# Patient Record
Sex: Male | Born: 1940
Health system: Southern US, Community
[De-identification: ages and names within clinical notes are randomized; demographics above are authoritative.]

## PROBLEM LIST (undated history)

## (undated) DIAGNOSIS — I25810 Atherosclerosis of coronary artery bypass graft(s) without angina pectoris: Secondary | ICD-10-CM

## (undated) DIAGNOSIS — I2510 Atherosclerotic heart disease of native coronary artery without angina pectoris: Secondary | ICD-10-CM

## (undated) DIAGNOSIS — G90511 Complex regional pain syndrome I of right upper limb: Secondary | ICD-10-CM

## (undated) DIAGNOSIS — I10 Essential (primary) hypertension: Secondary | ICD-10-CM

## (undated) DIAGNOSIS — I200 Unstable angina: Secondary | ICD-10-CM

## (undated) DIAGNOSIS — F419 Anxiety disorder, unspecified: Secondary | ICD-10-CM

## (undated) DIAGNOSIS — E669 Obesity, unspecified: Secondary | ICD-10-CM

## (undated) DIAGNOSIS — Z951 Presence of aortocoronary bypass graft: Secondary | ICD-10-CM

## (undated) DIAGNOSIS — R7303 Prediabetes: Secondary | ICD-10-CM

## (undated) DIAGNOSIS — K219 Gastro-esophageal reflux disease without esophagitis: Secondary | ICD-10-CM

## (undated) DIAGNOSIS — K449 Diaphragmatic hernia without obstruction or gangrene: Secondary | ICD-10-CM

## (undated) DIAGNOSIS — G250 Essential tremor: Secondary | ICD-10-CM

## (undated) DIAGNOSIS — H409 Unspecified glaucoma: Secondary | ICD-10-CM

## (undated) DIAGNOSIS — M4802 Spinal stenosis, cervical region: Secondary | ICD-10-CM

## (undated) DIAGNOSIS — G629 Polyneuropathy, unspecified: Secondary | ICD-10-CM

## (undated) DIAGNOSIS — E119 Type 2 diabetes mellitus without complications: Secondary | ICD-10-CM

## (undated) DIAGNOSIS — E785 Hyperlipidemia, unspecified: Secondary | ICD-10-CM

## (undated) DIAGNOSIS — R413 Other amnesia: Secondary | ICD-10-CM

## (undated) DIAGNOSIS — R269 Unspecified abnormalities of gait and mobility: Secondary | ICD-10-CM

## (undated) DIAGNOSIS — N400 Enlarged prostate without lower urinary tract symptoms: Secondary | ICD-10-CM

## (undated) HISTORY — DX: Unspecified abnormalities of gait and mobility: R26.9

## (undated) HISTORY — DX: Anxiety disorder, unspecified: F41.9

## (undated) HISTORY — DX: Gastro-esophageal reflux disease without esophagitis: K21.9

## (undated) HISTORY — DX: Essential (primary) hypertension: I10

## (undated) HISTORY — PX: EYE SURGERY: SHX253

## (undated) HISTORY — DX: Obesity, unspecified: E66.9

## (undated) HISTORY — DX: Diaphragmatic hernia without obstruction or gangrene: K44.9

## (undated) HISTORY — DX: Spinal stenosis, cervical region: M48.02

## (undated) HISTORY — DX: Unspecified glaucoma: H40.9

## (undated) HISTORY — DX: Type 2 diabetes mellitus without complications: E11.9

## (undated) HISTORY — DX: Benign prostatic hyperplasia without lower urinary tract symptoms: N40.0

## (undated) HISTORY — DX: Atherosclerotic heart disease of native coronary artery without angina pectoris: I25.10

## (undated) HISTORY — DX: Presence of aortocoronary bypass graft: Z95.1

## (undated) HISTORY — DX: Complex regional pain syndrome i of right upper limb: G90.511

## (undated) HISTORY — PX: LEFT HEART CATH AND CORONARY ANGIOGRAPHY: CATH118249

## (undated) HISTORY — DX: Polyneuropathy, unspecified: G62.9

## (undated) HISTORY — DX: Hyperlipidemia, unspecified: E78.5

## (undated) HISTORY — PX: CATARACT EXTRACTION, BILATERAL: SHX1313

## (undated) HISTORY — DX: Atherosclerosis of coronary artery bypass graft(s) without angina pectoris: I25.810

## (undated) HISTORY — PX: OTHER SURGICAL HISTORY: SHX169

## (undated) HISTORY — DX: Unstable angina: I20.0

## (undated) HISTORY — PX: KNEE SURGERY: SHX244

## (undated) HISTORY — DX: Other amnesia: R41.3

## (undated) HISTORY — PX: CARDIAC CATHETERIZATION: SHX172

## (undated) HISTORY — DX: Essential tremor: G25.0

---

## 1995-04-18 DIAGNOSIS — I2510 Atherosclerotic heart disease of native coronary artery without angina pectoris: Secondary | ICD-10-CM

## 1995-04-18 HISTORY — DX: Atherosclerotic heart disease of native coronary artery without angina pectoris: I25.10

## 1998-02-08 ENCOUNTER — Encounter: Admission: RE | Admit: 1998-02-08 | Discharge: 1998-02-08 | Payer: Self-pay | Admitting: Sports Medicine

## 1998-02-22 ENCOUNTER — Encounter: Admission: RE | Admit: 1998-02-22 | Discharge: 1998-02-22 | Payer: Self-pay | Admitting: Family Medicine

## 1998-04-14 ENCOUNTER — Encounter: Admission: RE | Admit: 1998-04-14 | Discharge: 1998-04-14 | Payer: Self-pay | Admitting: Family Medicine

## 1998-04-15 ENCOUNTER — Encounter: Admission: RE | Admit: 1998-04-15 | Discharge: 1998-04-15 | Payer: Self-pay | Admitting: Family Medicine

## 1998-04-21 ENCOUNTER — Encounter: Admission: RE | Admit: 1998-04-21 | Discharge: 1998-04-21 | Payer: Self-pay | Admitting: Family Medicine

## 1998-04-26 ENCOUNTER — Encounter: Admission: RE | Admit: 1998-04-26 | Discharge: 1998-04-26 | Payer: Self-pay | Admitting: Family Medicine

## 2000-04-30 ENCOUNTER — Encounter: Admission: RE | Admit: 2000-04-30 | Discharge: 2000-04-30 | Payer: Self-pay | Admitting: Family Medicine

## 2000-05-10 ENCOUNTER — Encounter: Admission: RE | Admit: 2000-05-10 | Discharge: 2000-05-10 | Payer: Self-pay | Admitting: Family Medicine

## 2000-06-19 ENCOUNTER — Encounter: Admission: RE | Admit: 2000-06-19 | Discharge: 2000-06-19 | Payer: Self-pay | Admitting: Family Medicine

## 2000-06-19 ENCOUNTER — Ambulatory Visit (HOSPITAL_COMMUNITY): Admission: RE | Admit: 2000-06-19 | Discharge: 2000-06-19 | Payer: Self-pay | Admitting: Family Medicine

## 2000-06-19 ENCOUNTER — Observation Stay (HOSPITAL_COMMUNITY): Admission: EM | Admit: 2000-06-19 | Discharge: 2000-06-20 | Payer: Self-pay

## 2000-06-20 ENCOUNTER — Encounter: Payer: Self-pay | Admitting: Sports Medicine

## 2000-07-05 ENCOUNTER — Encounter: Admission: RE | Admit: 2000-07-05 | Discharge: 2000-07-05 | Payer: Self-pay | Admitting: Family Medicine

## 2000-07-20 ENCOUNTER — Encounter: Admission: RE | Admit: 2000-07-20 | Discharge: 2000-07-20 | Payer: Self-pay | Admitting: Family Medicine

## 2000-08-01 ENCOUNTER — Encounter: Admission: RE | Admit: 2000-08-01 | Discharge: 2000-08-01 | Payer: Self-pay | Admitting: Family Medicine

## 2000-08-09 ENCOUNTER — Ambulatory Visit (HOSPITAL_COMMUNITY): Admission: RE | Admit: 2000-08-09 | Discharge: 2000-08-09 | Payer: Self-pay | Admitting: *Deleted

## 2000-08-15 ENCOUNTER — Encounter: Admission: RE | Admit: 2000-08-15 | Discharge: 2000-08-15 | Payer: Self-pay | Admitting: Family Medicine

## 2000-08-21 ENCOUNTER — Encounter (HOSPITAL_COMMUNITY): Admission: RE | Admit: 2000-08-21 | Discharge: 2000-11-19 | Payer: Self-pay | Admitting: Cardiology

## 2000-08-29 ENCOUNTER — Encounter: Admission: RE | Admit: 2000-08-29 | Discharge: 2000-08-29 | Payer: Self-pay | Admitting: Family Medicine

## 2000-09-12 ENCOUNTER — Encounter: Admission: RE | Admit: 2000-09-12 | Discharge: 2000-09-12 | Payer: Self-pay | Admitting: Family Medicine

## 2000-09-18 ENCOUNTER — Encounter: Admission: RE | Admit: 2000-09-18 | Discharge: 2000-09-18 | Payer: Self-pay | Admitting: Family Medicine

## 2000-09-21 ENCOUNTER — Ambulatory Visit (HOSPITAL_COMMUNITY): Admission: RE | Admit: 2000-09-21 | Discharge: 2000-09-21 | Payer: Self-pay | Admitting: *Deleted

## 2000-09-25 ENCOUNTER — Encounter: Admission: RE | Admit: 2000-09-25 | Discharge: 2000-09-25 | Payer: Self-pay | Admitting: Family Medicine

## 2000-10-25 ENCOUNTER — Encounter: Admission: RE | Admit: 2000-10-25 | Discharge: 2000-10-25 | Payer: Self-pay | Admitting: Family Medicine

## 2000-11-20 ENCOUNTER — Encounter (HOSPITAL_COMMUNITY): Admission: RE | Admit: 2000-11-20 | Discharge: 2001-02-18 | Payer: Self-pay | Admitting: Cardiology

## 2001-01-09 ENCOUNTER — Encounter: Payer: Self-pay | Admitting: Cardiology

## 2001-01-09 ENCOUNTER — Ambulatory Visit (HOSPITAL_COMMUNITY): Admission: RE | Admit: 2001-01-09 | Discharge: 2001-01-09 | Payer: Self-pay | Admitting: Cardiology

## 2001-02-21 ENCOUNTER — Encounter: Admission: RE | Admit: 2001-02-21 | Discharge: 2001-02-21 | Payer: Self-pay | Admitting: Family Medicine

## 2001-03-25 ENCOUNTER — Encounter: Admission: RE | Admit: 2001-03-25 | Discharge: 2001-03-25 | Payer: Self-pay | Admitting: Sports Medicine

## 2001-04-24 ENCOUNTER — Encounter: Admission: RE | Admit: 2001-04-24 | Discharge: 2001-04-24 | Payer: Self-pay | Admitting: Family Medicine

## 2001-09-03 ENCOUNTER — Ambulatory Visit (HOSPITAL_COMMUNITY): Admission: RE | Admit: 2001-09-03 | Discharge: 2001-09-03 | Payer: Self-pay | Admitting: Cardiology

## 2001-09-03 ENCOUNTER — Encounter: Payer: Self-pay | Admitting: Cardiology

## 2002-01-03 ENCOUNTER — Encounter: Admission: RE | Admit: 2002-01-03 | Discharge: 2002-01-03 | Payer: Self-pay | Admitting: Family Medicine

## 2002-01-15 ENCOUNTER — Encounter: Admission: RE | Admit: 2002-01-15 | Discharge: 2002-01-15 | Payer: Self-pay | Admitting: Family Medicine

## 2002-02-02 ENCOUNTER — Ambulatory Visit (HOSPITAL_BASED_OUTPATIENT_CLINIC_OR_DEPARTMENT_OTHER): Admission: RE | Admit: 2002-02-02 | Discharge: 2002-02-02 | Payer: Self-pay | Admitting: Family Medicine

## 2002-02-11 ENCOUNTER — Ambulatory Visit (HOSPITAL_COMMUNITY): Admission: RE | Admit: 2002-02-11 | Discharge: 2002-02-11 | Payer: Self-pay | Admitting: Family Medicine

## 2002-02-11 ENCOUNTER — Encounter: Admission: RE | Admit: 2002-02-11 | Discharge: 2002-02-11 | Payer: Self-pay | Admitting: Family Medicine

## 2002-02-27 ENCOUNTER — Encounter: Admission: RE | Admit: 2002-02-27 | Discharge: 2002-02-27 | Payer: Self-pay | Admitting: Family Medicine

## 2002-04-17 DIAGNOSIS — Z951 Presence of aortocoronary bypass graft: Secondary | ICD-10-CM

## 2002-04-17 HISTORY — DX: Presence of aortocoronary bypass graft: Z95.1

## 2002-05-19 ENCOUNTER — Encounter: Admission: RE | Admit: 2002-05-19 | Discharge: 2002-05-19 | Payer: Self-pay | Admitting: Family Medicine

## 2002-05-19 ENCOUNTER — Ambulatory Visit (HOSPITAL_COMMUNITY): Admission: RE | Admit: 2002-05-19 | Discharge: 2002-05-19 | Payer: Self-pay | Admitting: Family Medicine

## 2002-05-26 ENCOUNTER — Encounter: Admission: RE | Admit: 2002-05-26 | Discharge: 2002-05-26 | Payer: Self-pay | Admitting: Family Medicine

## 2002-06-04 ENCOUNTER — Encounter: Admission: RE | Admit: 2002-06-04 | Discharge: 2002-06-04 | Payer: Self-pay | Admitting: Family Medicine

## 2002-06-10 ENCOUNTER — Encounter: Admission: RE | Admit: 2002-06-10 | Discharge: 2002-06-10 | Payer: Self-pay | Admitting: Cardiology

## 2002-06-10 ENCOUNTER — Encounter: Payer: Self-pay | Admitting: Cardiology

## 2002-06-16 ENCOUNTER — Ambulatory Visit (HOSPITAL_COMMUNITY): Admission: RE | Admit: 2002-06-16 | Discharge: 2002-06-16 | Payer: Self-pay | Admitting: Cardiology

## 2002-06-16 HISTORY — PX: CORONARY ARTERY BYPASS GRAFT: SHX141

## 2002-06-26 ENCOUNTER — Encounter: Payer: Self-pay | Admitting: Cardiothoracic Surgery

## 2002-06-30 ENCOUNTER — Encounter: Payer: Self-pay | Admitting: Cardiothoracic Surgery

## 2002-06-30 ENCOUNTER — Inpatient Hospital Stay (HOSPITAL_COMMUNITY): Admission: RE | Admit: 2002-06-30 | Discharge: 2002-07-05 | Payer: Self-pay | Admitting: Cardiothoracic Surgery

## 2002-07-01 ENCOUNTER — Encounter: Payer: Self-pay | Admitting: Cardiothoracic Surgery

## 2002-07-02 ENCOUNTER — Encounter: Payer: Self-pay | Admitting: Cardiothoracic Surgery

## 2002-07-03 ENCOUNTER — Encounter: Payer: Self-pay | Admitting: Cardiothoracic Surgery

## 2002-08-01 ENCOUNTER — Encounter: Admission: RE | Admit: 2002-08-01 | Discharge: 2002-08-01 | Payer: Self-pay | Admitting: Cardiothoracic Surgery

## 2002-08-01 ENCOUNTER — Encounter: Payer: Self-pay | Admitting: Cardiothoracic Surgery

## 2002-08-11 ENCOUNTER — Encounter (HOSPITAL_COMMUNITY): Admission: RE | Admit: 2002-08-11 | Discharge: 2002-10-16 | Payer: Self-pay | Admitting: Cardiology

## 2002-09-04 ENCOUNTER — Encounter: Admission: RE | Admit: 2002-09-04 | Discharge: 2002-09-04 | Payer: Self-pay | Admitting: Family Medicine

## 2002-09-17 ENCOUNTER — Encounter: Admission: RE | Admit: 2002-09-17 | Discharge: 2002-09-17 | Payer: Self-pay | Admitting: Family Medicine

## 2002-09-23 ENCOUNTER — Encounter: Admission: RE | Admit: 2002-09-23 | Discharge: 2002-09-23 | Payer: Self-pay | Admitting: Sports Medicine

## 2002-10-08 ENCOUNTER — Encounter: Admission: RE | Admit: 2002-10-08 | Discharge: 2002-10-08 | Payer: Self-pay | Admitting: Family Medicine

## 2002-10-31 ENCOUNTER — Encounter: Admission: RE | Admit: 2002-10-31 | Discharge: 2002-10-31 | Payer: Self-pay | Admitting: Family Medicine

## 2002-12-29 ENCOUNTER — Encounter: Admission: RE | Admit: 2002-12-29 | Discharge: 2002-12-29 | Payer: Self-pay | Admitting: Family Medicine

## 2003-01-06 ENCOUNTER — Encounter: Payer: Self-pay | Admitting: Family Medicine

## 2003-01-06 ENCOUNTER — Ambulatory Visit (HOSPITAL_COMMUNITY): Admission: RE | Admit: 2003-01-06 | Discharge: 2003-01-06 | Payer: Self-pay | Admitting: Family Medicine

## 2003-01-28 ENCOUNTER — Encounter: Admission: RE | Admit: 2003-01-28 | Discharge: 2003-01-28 | Payer: Self-pay | Admitting: Family Medicine

## 2003-02-03 ENCOUNTER — Encounter: Admission: RE | Admit: 2003-02-03 | Discharge: 2003-02-17 | Payer: Self-pay | Admitting: Sports Medicine

## 2003-03-31 ENCOUNTER — Ambulatory Visit (HOSPITAL_COMMUNITY): Admission: RE | Admit: 2003-03-31 | Discharge: 2003-04-01 | Payer: Self-pay | Admitting: Orthopedic Surgery

## 2003-04-02 ENCOUNTER — Encounter: Admission: RE | Admit: 2003-04-02 | Discharge: 2003-04-17 | Payer: Self-pay | Admitting: Orthopedic Surgery

## 2003-04-18 ENCOUNTER — Encounter: Admission: RE | Admit: 2003-04-18 | Discharge: 2003-06-02 | Payer: Self-pay | Admitting: Orthopedic Surgery

## 2003-06-16 HISTORY — PX: OTHER SURGICAL HISTORY: SHX169

## 2003-06-26 ENCOUNTER — Encounter: Admission: RE | Admit: 2003-06-26 | Discharge: 2003-06-26 | Payer: Self-pay | Admitting: Family Medicine

## 2003-06-29 ENCOUNTER — Encounter: Admission: RE | Admit: 2003-06-29 | Discharge: 2003-06-29 | Payer: Self-pay | Admitting: Family Medicine

## 2003-07-28 ENCOUNTER — Encounter: Admission: RE | Admit: 2003-07-28 | Discharge: 2003-07-28 | Payer: Self-pay | Admitting: Sports Medicine

## 2003-07-28 ENCOUNTER — Encounter (INDEPENDENT_AMBULATORY_CARE_PROVIDER_SITE_OTHER): Payer: Self-pay | Admitting: Family Medicine

## 2003-09-22 ENCOUNTER — Ambulatory Visit (HOSPITAL_COMMUNITY): Admission: RE | Admit: 2003-09-22 | Discharge: 2003-09-23 | Payer: Self-pay | Admitting: Orthopedic Surgery

## 2003-09-29 ENCOUNTER — Encounter: Admission: RE | Admit: 2003-09-29 | Discharge: 2003-11-03 | Payer: Self-pay | Admitting: Orthopedic Surgery

## 2004-04-21 ENCOUNTER — Ambulatory Visit: Payer: Self-pay | Admitting: Family Medicine

## 2005-04-27 ENCOUNTER — Ambulatory Visit: Payer: Self-pay | Admitting: Family Medicine

## 2005-05-12 ENCOUNTER — Ambulatory Visit: Payer: Self-pay | Admitting: Sports Medicine

## 2005-09-08 ENCOUNTER — Ambulatory Visit: Payer: Self-pay | Admitting: Family Medicine

## 2005-10-11 ENCOUNTER — Ambulatory Visit: Payer: Self-pay | Admitting: Family Medicine

## 2005-11-08 ENCOUNTER — Ambulatory Visit: Payer: Self-pay | Admitting: Sports Medicine

## 2005-11-23 ENCOUNTER — Ambulatory Visit: Payer: Self-pay | Admitting: Family Medicine

## 2006-01-23 ENCOUNTER — Ambulatory Visit: Payer: Self-pay | Admitting: Family Medicine

## 2006-02-23 ENCOUNTER — Ambulatory Visit: Payer: Self-pay | Admitting: Family Medicine

## 2006-03-26 ENCOUNTER — Ambulatory Visit: Payer: Self-pay | Admitting: Family Medicine

## 2006-05-25 ENCOUNTER — Ambulatory Visit: Payer: Self-pay | Admitting: Family Medicine

## 2006-07-06 ENCOUNTER — Encounter (INDEPENDENT_AMBULATORY_CARE_PROVIDER_SITE_OTHER): Payer: Self-pay | Admitting: Family Medicine

## 2006-07-06 ENCOUNTER — Ambulatory Visit: Payer: Self-pay | Admitting: Family Medicine

## 2006-07-06 DIAGNOSIS — I10 Essential (primary) hypertension: Secondary | ICD-10-CM | POA: Insufficient documentation

## 2006-07-06 LAB — CONVERTED CEMR LAB
ALT: 13 units/L (ref 0–53)
AST: 17 units/L (ref 0–37)
Albumin: 4.4 g/dL (ref 3.5–5.2)
Alkaline Phosphatase: 89 units/L (ref 39–117)
BUN: 11 mg/dL (ref 6–23)
CO2: 27 meq/L (ref 19–32)
Calcium: 9.9 mg/dL (ref 8.4–10.5)
Chloride: 104 meq/L (ref 96–112)
Creatinine, Ser: 0.98 mg/dL (ref 0.40–1.50)
Glucose, Bld: 115 mg/dL — ABNORMAL HIGH (ref 70–99)
Potassium: 4.6 meq/L (ref 3.5–5.3)
Sodium: 141 meq/L (ref 135–145)
Total Bilirubin: 0.9 mg/dL (ref 0.3–1.2)
Total Protein: 7.1 g/dL (ref 6.0–8.3)

## 2006-08-30 ENCOUNTER — Ambulatory Visit: Payer: Self-pay | Admitting: Sports Medicine

## 2006-08-30 ENCOUNTER — Encounter (INDEPENDENT_AMBULATORY_CARE_PROVIDER_SITE_OTHER): Payer: Self-pay | Admitting: Family Medicine

## 2006-08-30 DIAGNOSIS — I25810 Atherosclerosis of coronary artery bypass graft(s) without angina pectoris: Secondary | ICD-10-CM | POA: Insufficient documentation

## 2006-08-30 DIAGNOSIS — G250 Essential tremor: Secondary | ICD-10-CM | POA: Insufficient documentation

## 2006-08-30 DIAGNOSIS — E782 Mixed hyperlipidemia: Secondary | ICD-10-CM | POA: Insufficient documentation

## 2006-08-30 LAB — CONVERTED CEMR LAB
Cholesterol, target level: 200 mg/dL
Direct LDL: 84 mg/dL
HDL goal, serum: 40 mg/dL
Hgb A1c MFr Bld: 5.8 %
LDL Goal: 100 mg/dL

## 2006-12-11 ENCOUNTER — Ambulatory Visit: Payer: Self-pay | Admitting: Family Medicine

## 2006-12-11 LAB — CONVERTED CEMR LAB: Hgb A1c MFr Bld: 5.6 %

## 2006-12-13 ENCOUNTER — Encounter (INDEPENDENT_AMBULATORY_CARE_PROVIDER_SITE_OTHER): Payer: Self-pay | Admitting: Family Medicine

## 2007-01-03 ENCOUNTER — Ambulatory Visit: Payer: Self-pay | Admitting: Family Medicine

## 2007-01-21 ENCOUNTER — Ambulatory Visit: Payer: Self-pay | Admitting: Sports Medicine

## 2007-02-13 ENCOUNTER — Ambulatory Visit: Payer: Self-pay | Admitting: Family Medicine

## 2007-03-06 ENCOUNTER — Ambulatory Visit: Payer: Self-pay | Admitting: Family Medicine

## 2007-06-26 ENCOUNTER — Encounter (INDEPENDENT_AMBULATORY_CARE_PROVIDER_SITE_OTHER): Payer: Self-pay | Admitting: Family Medicine

## 2007-06-26 ENCOUNTER — Ambulatory Visit: Payer: Self-pay | Admitting: Family Medicine

## 2007-06-26 LAB — CONVERTED CEMR LAB
BUN: 10 mg/dL (ref 6–23)
CO2: 25 meq/L (ref 19–32)
Calcium: 9.2 mg/dL (ref 8.4–10.5)
Chloride: 108 meq/L (ref 96–112)
Cholesterol: 118 mg/dL (ref 0–200)
Creatinine, Ser: 0.92 mg/dL (ref 0.40–1.50)
Glucose, Bld: 121 mg/dL — ABNORMAL HIGH (ref 70–99)
HCT: 42.4 % (ref 39.0–52.0)
HDL: 33 mg/dL — ABNORMAL LOW (ref >39)
Hemoglobin: 13.7 g/dL (ref 13.0–17.0)
Hgb A1c MFr Bld: 5.7 %
LDL Cholesterol: 64 mg/dL (ref 0–99)
MCHC: 32.3 g/dL (ref 30.0–36.0)
MCV: 96.8 fL (ref 78.0–100.0)
Platelets: 156 10*3/uL (ref 150–400)
Potassium: 5 meq/L (ref 3.5–5.3)
RBC: 4.38 M/uL (ref 4.22–5.81)
RDW: 14.2 % (ref 11.5–15.5)
Sodium: 142 meq/L (ref 135–145)
Total CHOL/HDL Ratio: 3.6
Triglycerides: 105 mg/dL (ref <150)
VLDL: 21 mg/dL (ref 0–40)
WBC: 5 10*3/uL (ref 4.0–10.5)

## 2007-06-27 ENCOUNTER — Encounter (INDEPENDENT_AMBULATORY_CARE_PROVIDER_SITE_OTHER): Payer: Self-pay | Admitting: Family Medicine

## 2007-09-06 ENCOUNTER — Ambulatory Visit: Payer: Self-pay | Admitting: Family Medicine

## 2007-10-28 ENCOUNTER — Ambulatory Visit: Payer: Self-pay | Admitting: Family Medicine

## 2007-10-28 DIAGNOSIS — K219 Gastro-esophageal reflux disease without esophagitis: Secondary | ICD-10-CM | POA: Insufficient documentation

## 2007-10-28 LAB — CONVERTED CEMR LAB: H Pylori IgG: NEGATIVE

## 2007-10-30 ENCOUNTER — Encounter: Payer: Self-pay | Admitting: Family Medicine

## 2007-10-30 ENCOUNTER — Encounter: Admission: RE | Admit: 2007-10-30 | Discharge: 2007-10-30 | Payer: Self-pay | Admitting: Family Medicine

## 2007-11-22 ENCOUNTER — Ambulatory Visit: Payer: Self-pay | Admitting: Family Medicine

## 2008-02-07 ENCOUNTER — Ambulatory Visit: Payer: Self-pay | Admitting: Family Medicine

## 2008-03-10 ENCOUNTER — Ambulatory Visit: Payer: Self-pay | Admitting: Family Medicine

## 2008-03-10 LAB — CONVERTED CEMR LAB: Hgb A1c MFr Bld: 5.8 %

## 2008-04-24 ENCOUNTER — Telehealth: Payer: Self-pay | Admitting: Family Medicine

## 2008-04-29 ENCOUNTER — Ambulatory Visit: Payer: Self-pay | Admitting: Family Medicine

## 2008-04-29 DIAGNOSIS — I209 Angina pectoris, unspecified: Secondary | ICD-10-CM | POA: Insufficient documentation

## 2008-06-10 ENCOUNTER — Ambulatory Visit: Payer: Self-pay | Admitting: Family Medicine

## 2008-06-30 ENCOUNTER — Ambulatory Visit: Payer: Self-pay | Admitting: Family Medicine

## 2008-06-30 ENCOUNTER — Encounter: Payer: Self-pay | Admitting: Family Medicine

## 2008-06-30 LAB — CONVERTED CEMR LAB
ALT: 22 units/L (ref 0–53)
AST: 17 units/L (ref 0–37)
Albumin: 4.6 g/dL (ref 3.5–5.2)
Alkaline Phosphatase: 105 units/L (ref 39–117)
BUN: 9 mg/dL (ref 6–23)
CO2: 26 meq/L (ref 19–32)
Calcium: 9.7 mg/dL (ref 8.4–10.5)
Chloride: 103 meq/L (ref 96–112)
Cholesterol: 142 mg/dL (ref 0–200)
Creatinine, Ser: 1.02 mg/dL (ref 0.40–1.50)
Glucose, Bld: 95 mg/dL (ref 70–99)
HCT: 43 % (ref 39.0–52.0)
HDL: 37 mg/dL — ABNORMAL LOW (ref >39)
Hemoglobin: 14.8 g/dL (ref 13.0–17.0)
LDL Cholesterol: 81 mg/dL (ref 0–99)
MCHC: 34.4 g/dL (ref 30.0–36.0)
MCV: 93.9 fL (ref 78.0–100.0)
PSA: 1 ng/mL (ref 0.10–4.00)
Platelets: 154 10*3/uL (ref 150–400)
Potassium: 5 meq/L (ref 3.5–5.3)
RBC: 4.58 M/uL (ref 4.22–5.81)
RDW: 14.8 % (ref 11.5–15.5)
Sodium: 141 meq/L (ref 135–145)
Total Bilirubin: 0.6 mg/dL (ref 0.3–1.2)
Total CHOL/HDL Ratio: 3.8
Total Protein: 7.8 g/dL (ref 6.0–8.3)
Triglycerides: 118 mg/dL (ref <150)
VLDL: 24 mg/dL (ref 0–40)
WBC: 5.3 10*3/uL (ref 4.0–10.5)

## 2008-07-01 ENCOUNTER — Encounter: Payer: Self-pay | Admitting: Family Medicine

## 2008-11-06 ENCOUNTER — Ambulatory Visit: Payer: Self-pay | Admitting: Family Medicine

## 2009-01-29 ENCOUNTER — Ambulatory Visit: Payer: Self-pay | Admitting: Family Medicine

## 2009-03-03 ENCOUNTER — Encounter: Payer: Self-pay | Admitting: Family Medicine

## 2009-03-23 ENCOUNTER — Encounter: Payer: Self-pay | Admitting: Family Medicine

## 2009-03-23 HISTORY — PX: NM MYOCAR PERF WALL MOTION: HXRAD629

## 2009-07-14 ENCOUNTER — Ambulatory Visit: Payer: Self-pay | Admitting: Family Medicine

## 2009-07-14 ENCOUNTER — Encounter: Payer: Self-pay | Admitting: Family Medicine

## 2009-07-14 DIAGNOSIS — E663 Overweight: Secondary | ICD-10-CM | POA: Insufficient documentation

## 2009-07-14 LAB — CONVERTED CEMR LAB
ALT: 18 units/L (ref 0–53)
AST: 18 units/L (ref 0–37)
Albumin: 4.5 g/dL (ref 3.5–5.2)
Alkaline Phosphatase: 85 units/L (ref 39–117)
BUN: 12 mg/dL (ref 6–23)
CO2: 27 meq/L (ref 19–32)
Calcium: 9.5 mg/dL (ref 8.4–10.5)
Chloride: 106 meq/L (ref 96–112)
Cholesterol: 129 mg/dL (ref 0–200)
Creatinine, Ser: 1.14 mg/dL (ref 0.40–1.50)
Glucose, Bld: 104 mg/dL — ABNORMAL HIGH (ref 70–99)
HCT: 41.8 % (ref 39.0–52.0)
HDL: 29 mg/dL — ABNORMAL LOW (ref >39)
Hemoglobin: 14.2 g/dL (ref 13.0–17.0)
Hgb A1c MFr Bld: 5.5 %
LDL Cholesterol: 79 mg/dL (ref 0–99)
MCHC: 34 g/dL (ref 30.0–36.0)
MCV: 97.9 fL (ref 78.0–100.0)
PSA: 1.06 ng/mL (ref 0.10–4.00)
Platelets: 151 10*3/uL (ref 150–400)
Potassium: 4.7 meq/L (ref 3.5–5.3)
RBC: 4.27 M/uL (ref 4.22–5.81)
RDW: 13.9 % (ref 11.5–15.5)
Sodium: 142 meq/L (ref 135–145)
Total Bilirubin: 0.6 mg/dL (ref 0.3–1.2)
Total CHOL/HDL Ratio: 4.4
Total Protein: 7.2 g/dL (ref 6.0–8.3)
Triglycerides: 104 mg/dL (ref <150)
VLDL: 21 mg/dL (ref 0–40)
WBC: 5 10*3/uL (ref 4.0–10.5)

## 2009-07-21 ENCOUNTER — Encounter: Payer: Self-pay | Admitting: Family Medicine

## 2009-07-21 LAB — CONVERTED CEMR LAB
OCCULT 1: NEGATIVE
OCCULT 2: NEGATIVE
OCCULT 3: NEGATIVE

## 2009-09-27 ENCOUNTER — Ambulatory Visit: Admission: RE | Admit: 2009-09-27 | Discharge: 2009-09-27 | Payer: Self-pay | Admitting: Family Medicine

## 2009-09-27 ENCOUNTER — Ambulatory Visit: Payer: Self-pay | Admitting: Vascular Surgery

## 2009-09-27 ENCOUNTER — Encounter: Payer: Self-pay | Admitting: Family Medicine

## 2009-09-27 ENCOUNTER — Ambulatory Visit: Payer: Self-pay | Admitting: Family Medicine

## 2009-09-27 ENCOUNTER — Telehealth: Payer: Self-pay | Admitting: Family Medicine

## 2010-01-18 ENCOUNTER — Ambulatory Visit: Payer: Self-pay | Admitting: Family Medicine

## 2010-01-28 ENCOUNTER — Encounter: Payer: Self-pay | Admitting: Family Medicine

## 2010-02-24 ENCOUNTER — Encounter: Payer: Self-pay | Admitting: Family Medicine

## 2010-05-07 ENCOUNTER — Encounter: Payer: Self-pay | Admitting: Orthopedic Surgery

## 2010-05-19 NOTE — Assessment & Plan Note (Signed)
Summary: F/U & FLU SHOT/[facility]   Vital Signs:  Patient profile:   70 year old male Height:      65.5 inches Weight:      198 pounds BMI:     32.57 Temp:     97.7 degrees F oral Pulse rate:   53 / minute BP sitting:   144 / 78  (left arm) Cuff size:   large  Vitals Entered By: Jimmy Footman, CMA (January 18, 2010 9:02 AM) CC: f/u & flu shot Is Patient Diabetic? No Pain Assessment Patient in pain? no        Primary Care Provider:  CAT TA MD  CC:  f/u & flu shot.  History of Present Illness: 69y/o M here for f/u of   HYPERTENSION/CAD Disease Monitoring: usually checks, but has not checked lately.  Blood pressure range: ave 130s/80s Medications: propanolol 80mg  two times a day  Compliance:yes  Lightheadedness: no  Edema:no  Chest pain: no  Dyspnea:no Prevention Exercise: walks Salt restriction:yes Weigth: +1 lb since last visit History of CABG 06/2002, seeing Dr Clarene Duke.  Had negative exercise stress test 03/2009.    TREMORS: Sometimes worse than others, but not consistent.  He is taking propanolol and primidone.  He is not bothered by it.    HYPERLIPIDEMIA: Med: Zocor 80mg   Compliance:yes Prob taking med:no  Muscle aches:no Abd  pain:no  Hiatal Hernia: Not sure of when it was Dx. Pt states that it was Dx by Dr Clarene Duke when he did card cath.  Pt recalls having an EGD but does not remember who.  There is not record in [facility].  He thinks that the hernia is increasing in size. It does not cause too much discomfort but sometimes he feels that it is getting "tight".  He continues taking prilosec daily for reflux and does not believe that it is getting worse.   No weight loss, No chronic cough, no chest pain, No nausea/vomiting.    Habits & Providers  Alcohol-Tobacco-Diet     Tobacco Status: quit > 6 months     Tobacco Counseling: to remain off tobacco products  Current Medications (verified): 1)  Propranolol Hcl 80 Mg Tabs (Propranolol Hcl) .... Take 1 Tablet By Mouth  Twice Daily 2)  Zocor 80 Mg Tabs (Simvastatin) .... Take 1 Tablet By Mouth At Bedtime 3)  Primidone 250 Mg Tabs (Primidone) .... By Mouth One Tablet Two Times A Day 4)  Prilosec Otc 20 Mg  Tbec (Omeprazole Magnesium) .[name].. 1 Tab By Mouth Daily 5)  Mupirocin 2 %  Oint (Mupirocin) .... Apply To Nasal Area and Underfinger Nails.  20 Mls 6)  Adult Aspirin Ec Low Strength 81 Mg  Tbec (Aspirin) .[name].. 1 By Mouth Daily 7)  Fish Oil   Oil (Fish Oil) 8)  Ativan 0.5 Mg Tabs (Lorazepam) .... By Mouth One Table Daily As Needed For Anxiety 9)  Nitroglycerin 0.4 Mg Subl (Nitroglycerin) .... Take One Every Five Minutes When Experiencing Chest Pain; Call 911 If Chest Pain Not Better After 3 Tabs  Allergies (verified): 1)  ! Lisinopril  Past History:  Past Surgical History: Last updated: 09/27/2009 CABG - 06/16/2002, Cardiac Cath - LAD dz, EF 60% - 07/03/2000,  Cardiac Cath - Single vessel disease - 11/16/1995, Cardiolite - 07/28/2003,  Cardiolite - EF 56%, no ischemia, thickening - 07/03/2000,  L shoulder orthoscopic surg - 06/26/2003,  pre- DM fasting 110 02/2006 - 05/25/2006,  TSH 2.469 - 07/01/2003 Negative stress test12/7/10 by Dr Lynnea Ferrier  Family History: Last updated: 06/14/2006 No MI, CA, DM.  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  Occ EtoH -- 2 mixed drinks or 3 beers.  No tob, drugs.  Married to May Rathbone; Little--cards; Willis--neuro; Zenaida Niece Trigt-CVTS; Shoemaker-ENT; Evette Cristal - GI  Code status:  Full Code, but pt does not want to remain on artificial life support indefinitely. Has Living Will.  Wife, Byrd Rushlow, is POA if pt is incapacitated.  Risk Factors: Alcohol Use: 1 (11/06/2008) Exercise: yes (11/06/2008)  Risk Factors: Smoking Status: quit > 6 months (01/18/2010)  Past Medical History: -Completed cardiac Rehab X 1, begun again 5/04, hx gastritis, Nonspecific insomnia, Patient with strong h/o of alcohol abuse (remote), post op anemia Hb  11.1 when iron started 09/23/02. -Benign Essential Tremors -Negative Stress test 03/23/2009 by Dr Lynnea Ferrier -Cardiologist: Dr Caprice Kluver -Eye doctor:  Neldon Labella on [address].  Eye exam 5-09/2007.  Family History: Reviewed history from 06/14/2006 and no changes required. No MI, CA, DM.  Social History: Reviewed history from 11/06/2008 and no changes required. 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  No tob, drugs.  Married to May Mcclune; Little--cards; Willis--neuro; Zenaida Niece Trigt-CVTS; Shoemaker-ENT; Evette Cristal - GI  Code status:  Full Code, but pt does not want to remain on artificial life support indefinitely. Has Living Will.  Wife, Atsushi Yom, is POA if pt is incapacitated.  Smoking Status:  quit > 6 months  Review of Systems       per hpi  Physical Exam  General:  Well-developed,well-nourished,in no acute distress; alert,appropriate and cooperative throughout examination. Vitals reviewed.  Neck:  No deformities, masses, or tenderness noted. Lungs:  Normal respiratory effort, chest expands symmetrically. Lungs are clear to auscultation, no crackles or wheezes. Heart:  Normal rate and regular rhythm. S1 and S2 normal without gallop, murmur, click, rub or other extra sounds. Abdomen:  Bowel sounds positive,abdomen soft and non-tender without masses, organomegaly or hernias noted.  No abd bruit.  When pt tightens abd muscles, bearing down, there is a protrusion that is palpable, size 6cm x 8cm.  no tenderness.   Msk:  No deformity or scoliosis noted of thoracic or lumbar spine.   Pulses:  R and L carotid,radial,,dorsalis pedis  pulses are full and equal bilaterally Extremities:  No clubbing, cyanosis, edema, or deformity noted with normal full range of motion of all joints.   Neurologic:  No cranial nerve deficits noted. Station and gait are normal. Plantar reflexes are down-going bilaterally. DTRs are symmetrical throughout. Sensory, motor and  coordinative functions appear intact. Skin:  Intact without suspicious lesions or rashes Cervical Nodes:  No lymphadenopathy noted   Impression & Recommendations:  Problem # 1:  HIATAL HERNIA WITH REFLUX (ICD-553.3) Assessment New Pt endorses hiatal hernia, but I do not have imaging for this.  It is not bothering him, although he thinks that it is increasing in size.  He does not seem to have any symptoms of dysphagia or nausea/vomiting.  He does have reflux, which is well controlled with prilosec.    He states that he has had EGD several years ago but does not recall when/where. I would like to refer him to see Dr Bosie Clos [facility]) for new patient consult.    Orders: Gastroenterology Referral (GI) [facility]- Est  Level 4 (66063)  Problem # 2:  HYPERTENSION, BENIGN (ICD-401.1) Assessment: Deteriorated  BP mildly elevated compared to previous.  His SBPs are usually in the 130s.  I will not make changes at this time.  Pt to check BP at home and call me if he continues to see elevations in BP.  Note: cannot tolerated ACE -->cough.    Orders: [facility]- Est  Level 4 (16109)  Problem # 3:  CORONARY ATHEROSCLEROSIS, ARTERY BYPASS GRAFT (ICD-414.04) Assessment: Unchanged  Followed by Dr Clarene Duke.   Negative exercise stress test 03/2009.  Optimize BP and Chol control.  NO changes.  No chest pain.     The following medications were removed from the medication list:    Aspirin Adult Low Strength 81 Mg Tbec (Aspirin) ..... By mouth daily His updated medication list for this problem includes:    Adult Aspirin Ec Low Strength 81 Mg Tbec (Aspirin) .[name]... 1 by mouth daily    Nitroglycerin 0.4 Mg Subl (Nitroglycerin) .[name]... Take one every five minutes when experiencing chest pain; call 911 if chest pain not better after 3 tabs  Orders: [facility]- Est  Level 4 (99214)  Problem # 4:  HYPERLIPIDEMIA, MIXED (ICD-272.2) Assessment: Unchanged  Discussed with pt the risk benefits of high dose Simvastatin.  Pt has  been on this med for yrs and his cholesterol is managed very well on this.   Given his CAD with CABG history BP and cholesterol control are upmost importance.  We will remain on this medicine and pt to inform me if he starts to experience myalgias, angioedema, etc.  Labs from 06/2009 showed normal renal and liver functions.    His updated medication list for this problem includes:    Zocor 80 Mg Tabs (Simvastatin) .[name]... Take 1 tablet by mouth at bedtime  Orders: [facility]- Est  Level 4 (60454)  Complete Medication List: 1)  Propranolol Hcl 80 Mg Tabs (propranolol Hcl)  .... Take 1 tablet by mouth twice daily 2)  Zocor 80 Mg Tabs (Simvastatin) .... Take 1 tablet by mouth at bedtime 3)  Primidone 250 Mg Tabs (Primidone) .... By mouth one tablet two times a day 4)  Prilosec Otc 20 Mg Tbec (Omeprazole magnesium) .[name].. 1 tab by mouth daily 5)  Mupirocin 2 % Oint (Mupirocin) .... Apply to nasal area and underfinger nails.  20 mls 6)  Adult Aspirin Ec Low Strength 81 Mg Tbec (Aspirin) .[name].. 1 by mouth daily 7)  Fish Oil Oil (Fish oil) 8)  Ativan 0.5 Mg Tabs (Lorazepam) .... By mouth one table daily as needed for anxiety 9)  Nitroglycerin 0.4 Mg Subl (Nitroglycerin) .... Take one every five minutes when experiencing chest pain; call 911 if chest pain not better after 3 tabs  Patient Instructions: 1)  Make 2 appointments: one to see me in 6 months, and another one with Lab one week before appointment with me.  Please make appt for cholesterol check.  This has to be fasting, so no food or drink after midnight the night before appointment. 2)  We will call you with GI appointment (Hernia). Prescriptions: PRIMIDONE 250 MG TABS (PRIMIDONE) by mouth one tablet two times a day  #180 x 4   Entered and Authorized by:   Angeline Slim MD   Signed by:   Angeline Slim MD on 01/18/2010   Method used:   Electronically to        [facility]* (retail)             ,          Ph: [phone]       Fax:  [phone]   RxID:    [id number] ZOCOR 80 MG TABS (SIMVASTATIN) Take 1 tablet by mouth at bedtime  #90 x 4   Entered and Authorized by:   Angeline Slim MD   Signed by:   Angeline Slim MD on 01/18/2010   Method used:   Electronically to        [facility]* (retail)             ,          Ph: [phone]       Fax: [phone]   RxID:   [id number] PROPRANOLOL HCL 80 MG TABS (PROPRANOLOL HCL) Take 1 tablet by mouth twice daily  #180 x 4   Entered and Authorized by:   Angeline Slim MD   Signed by:   Angeline Slim MD on 01/18/2010   Method used:   Faxed to ...       [facility]* (retail)             ,          Ph: [phone]       Fax: [phone]   RxID:   [id number]    Prevention & Chronic Care Immunizations   Influenza vaccine: Fluvax MCR  (01/29/2009)   Influenza vaccine due: 02/06/2009    Tetanus booster: 05/18/2002: Done.   Tetanus booster due: 05/18/2012    Pneumococcal vaccine: Done.  (11/15/1996)   Pneumococcal vaccine deferral: Not indicated  (01/18/2010)   Pneumococcal vaccine due: None    H. zoster vaccine: Not documented  Colorectal Screening   Hemoccult: Done.  (09/21/2005)   Hemoccult action/deferral: Ordered  (07/14/2009)   Hemoccult due: Not Indicated    Colonoscopy: Done.  (09/21/2005)   Colonoscopy due: 09/22/2015  Other Screening   PSA: 1.06  (07/14/2009)   PSA due due: 06/30/2009   Smoking status: quit > 6 months  (01/18/2010)  Lipids   Total Cholesterol: 129  (07/14/2009)   LDL: 79  (07/14/2009)   LDL Direct: 84  (08/30/2006)   HDL: 29  (07/14/2009)   Triglycerides: 104  (07/14/2009)    SGOT (AST): 18  (07/14/2009)   SGPT (ALT): 18  (07/14/2009)   Alkaline phosphatase: 85  (07/14/2009)   Total bilirubin: 0.6  (07/14/2009)    Lipid flowsheet reviewed?: Yes   Progress toward LDL goal: At goal  Hypertension   Last Blood Pressure: 144 / 78  (01/18/2010)   Serum creatinine: 1.14  (07/14/2009)   BMP action: Ordered   Serum potassium 4.7  (07/14/2009)     Hypertension flowsheet reviewed?: Yes   Progress toward BP goal: Deteriorated  Self-Management Support :   Personal Goals (by the next clinic visit) :      Personal blood pressure goal: 130/80  (07/14/2009)     Personal LDL goal: 70  (01/18/2010)    Hypertension self-management support: Written self-care plan  (07/14/2009)    Lipid self-management support: Written self-care plan  (07/14/2009)    Nursing Instructions: Give Flu vaccine today   Appended Document: F/U & FLU SHOT/[facility]   Immunizations Administered:  Influenza Vaccine # 1:    Vaccine Type: Fluvax MCR    Site: left deltoid    Mfr: Sanofi Pasteur    Dose: 0.5 ml    Route: IM    Given by: Jimmy Footman, CMA    Exp. Date: 10/12/2010    Lot #: [id number]    VIS given: 11/09/09 version given January 18, 2010.  Flu Vaccine Consent Questions:  Do you have a history of severe allergic reactions to this vaccine? no    Any prior history of allergic reactions to egg and/or gelatin? no    Do you have a sensitivity to the preservative Thimersol? no    Do you have a past history of Guillan-Barre Syndrome? no    Do you currently have an acute febrile illness? no    Have you ever had a severe reaction to latex? no    Vaccine information given and explained to patient? yes

## 2010-05-19 NOTE — Consult Note (Signed)
Summary: [facility]  [facility]   Imported By: De Nurse 02/02/2010 15:06:20  _____________________________________________________________________  External Attachment:    Type:   Image     Comment:   External Document  Appended Document: [facility]    Past History:  Past Medical History: -Completed cardiac Rehab X 1, begun again 5/04, hx gastritis, Nonspecific insomnia, Patient with strong h/o of alcohol abuse (remote), post op anemia Hb 11.1 when iron started 09/23/02. -Benign Essential Tremors -Negative Stress test 03/23/2009 by Dr Lynnea Ferrier -Cardiologist: Dr Caprice Kluver -Eye doctor:  Neldon Labella on [place].  Eye exam 5-09/2007. -GI: Dr Ewing Schlein, as needed for reflux esophagitis -Ventral Hernia, surgery as needed

## 2010-05-19 NOTE — Progress Notes (Signed)
Summary: Dopler u/s negative  Phone Note Outgoing Call   Call placed by: Aizley Stenseth MD,  September 27, 2009 2:57 PM Call placed to: Patient Summary of Call: Report per RN that Dopler negative for DVT.  Called to discuss with Pt the NEGATIVE result of DVT.  He can call back if LE edema occurs again.   Initial call taken by: Clebert Wenger MD,  September 27, 2009 2:58 PM

## 2010-05-19 NOTE — Miscellaneous (Signed)
Summary: Changing Prob List   Clinical Lists Changes  Problems: Removed problem of NEED PROPHYLACTIC VACCINATION&INOCULATION FLU (ICD-V04.81) Removed problem of SPECIAL SCREENING MALIGNANT NEOPLASM OF PROSTATE (ICD-V76.44) Removed problem of AFTERCARE, LONG-TERM USE, MEDICATIONS NEC (ICD-V58.69) Removed problem of VENTRAL HERNIA (ICD-553.20) Removed problem of FURUNCLE, RECURRENT (ICD-680.9) Removed problem of GLUCOSE INTOLERANCE, HX OF (ICD-V12.2) Observations: Added new observation of PAST MED HX: -Completed cardiac Rehab X 1, begun again 5/04, hx gastritis, Nonspecific insomnia, Patient with strong h/o of alcohol abuse (remote), post op anemia Hb 11.1 when iron started 09/23/02. -Benign Essential Tremors -Negative Stress test 03/23/2009 by Dr Lynnea Ferrier -Cardiologist: Dr Caprice Kluver -Eye doctor:  Neldon Labella on [address].  Eye exam 5-09/2007. -GI: Dr Ewing Schlein, as needed for reflux esophagitis -Ventral Hernia, surgery as needed  -Recurrent furuncle -Hx of glucose intolerance   (02/24/2010 15:11) Added new observation of PRIMARY MD: CAT TA MD (02/24/2010 15:11) Added new observation of CHIEF CMPLNT: Depression (02/24/2010 15:11)          Past History:  Past Medical History: -Completed cardiac Rehab X 1, begun again 5/04, hx gastritis, Nonspecific insomnia, Patient with strong h/o of alcohol abuse (remote), post op anemia Hb 11.1 when iron started 09/23/02. -Benign Essential Tremors -Negative Stress test 03/23/2009 by Dr Lynnea Ferrier -Cardiologist: Dr Caprice Kluver -Eye doctor:  Neldon Labella on [address].  Eye exam 5-09/2007. -GI: Dr Ewing Schlein, as needed for reflux esophagitis -Ventral Hernia, surgery as needed  -Recurrent furuncle -Hx of glucose intolerance

## 2010-05-19 NOTE — Assessment & Plan Note (Signed)
Summary: fu/kh   Vital Signs:  Patient profile:   70 year old male Height:      65.5 inches Weight:      199.2 pounds BMI:     32.76 Pulse rate:   60 / minute BP sitting:   130 / 77  (left arm) Cuff size:   large  Vitals Entered By: Renato Battles slade,cma CC: f/up per Dr.Kionte Baumgardner. pt states 'I have no problems. Maybe need blood work. I am here, because Dr.Meagan Spease told me to come in' Is Patient Diabetic? No Pain Assessment Patient in pain? no        Primary Care Provider:  Jaretssi Kraker MD  CC:  f/up per Dr.Beola Vasallo. pt states 'I have no problems. Maybe need blood work. I am here and because Dr.Runell Kovich told me to come in'.  History of Present Illness: 70 y/o M here for 6-mo f/u  Urinary/Bowel:   Voiding more often than before x 2-3 mos If he takes a long drive he has to stop to void and have BM Usually has 2-3 BMs per day, but for the last 2-3 months, having  BM 3-5 times per day About last montht has decreased to 1-2 per days.   non bloody.  no constipation.  no black stools. no tarry stools.  usually brown. no weight loss. no abd pain   Weight gain: Not exercising.  He has had viral symptoms in the past week which prevented him from exercisng.  But per pt's admission has not exercised since Nov.  He used to work in his yard and ride stationary bike.  He does feel some dyspnea with over exertion.  refraining from salt.    Habits & Providers  Alcohol-Tobacco-Diet     Tobacco Status: quit  Current Medications (verified): 1)  Propranolol Hcl 80 Mg Tabs (Propranolol Hcl) .... Take 1 Tablet By Mouth Twice Daily 2)  Zocor 80 Mg Tabs (Simvastatin) .... Take 1 Tablet By Mouth At Bedtime 3)  Primidone 250 Mg Tabs (Primidone) .... By Mouth One Tablet Two Times A Day 4)  Prilosec Otc 20 Mg  Tbec (Omeprazole Magnesium) .[name].. 1 Tab By Mouth Daily 5)  Mupirocin 2 %  Oint (Mupirocin) .... Apply To Nasal Area and Underfinger Nails.  20 Mls 6)  Adult Aspirin Ec Low Strength 81 Mg  Tbec (Aspirin) .[name].. 1 By Mouth  Daily 7)  Fish Oil   Oil (Fish Oil) 8)  Ativan 0.5 Mg Tabs (Lorazepam) .... By Mouth One Table Daily As Needed For Anxiety 9)  Nitroglycerin 0.4 Mg Subl (Nitroglycerin) .... Take One Every Five Minutes When Experiencing Chest Pain; Call 911 If Chest Pain Not Better After 3 Tabs 10)  Aspirin Adult Low Strength 81 Mg Tbec (Aspirin) .... By Mouth Daily  Allergies (verified): 1)  ! Lisinopril  Past History:  Past Medical History: Last updated: 03/29/2009 Completed cardiac Rehab X 1, begun again 5/04, hx gastritis, Nonspecific insomnia, Patient with strong h/o of alcohol abuse (remote), post op anemia Hb 11.1 when iron started 09/23/02. Benign Essential Tremors Negative Stress test 03/23/2009 by Dr Lynnea Ferrier  Cardiologist: Dr Caprice Kluver  Eye doctor:  Neldon Labella on [address].  Eye exam 5-09/2007.  Past Surgical History: Last updated: 03/29/2009 CABG - 06/16/2002, Cardiac Cath - LAD dz, EF 60% - 07/03/2000, Cardiac Cath - Single vessel disease - 11/16/1995, Cardiolite - 07/28/2003, Cardiolite - EF 56%, no ischemia, thickening - 07/03/2000, L shoulder orthoscopic surg - 06/26/2003, pre- DM fasting 110 02/2006 - 05/25/2006, TSH 2.469 -  07/01/2003 Negative stress test12/7/10 by Dr Lynnea Ferrier  Family History: Last updated: 06/14/2006 No MI, CA, DM.  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  Occ EtoH -- 2 mixed drinks or 3 beers.  No tob, drugs.  Married to May Askin; Little--cards; Willis--neuro; Zenaida Niece Trigt-CVTS; Shoemaker-ENT; Evette Cristal - GI  Code status:  Full Code, but pt does not want to remain on artificial life support indefinitely. Has Living Will.  Wife, Landers Prajapati, is POA if pt is incapacitated.  Risk Factors: Smoking Status: quit (07/14/2009)  Review of Systems       per hpi  Physical Exam  General:  Well-developed,well-nourished,in no acute distress; alert,appropriate and cooperative throughout examination Head:  normocephalic and atraumatic.   normocephalic and atraumatic.   Neck:  supple and full ROM.  supple and full ROM.   Lungs:  Normal respiratory effort, chest expands symmetrically. Lungs are clear to auscultation, no crackles or wheezes. Heart:  Normal rate and regular rhythm. S1 and S2 normal without gallop, murmur, click, rub or other extra sounds. Abdomen:  Bowel sounds positive,abdomen soft and non-tender without masses, organomegaly or hernias noted. Extremities:  No clubbing, cyanosis, edema, or deformity noted with normal full range of motion of all joints.   Neurologic:  No cranial nerve deficits noted. Station and gait are normal. Plantar reflexes are down-going bilaterally. DTRs are symmetrical throughout. Sensory, motor and coordinative functions appear intact. Skin:  Intact without suspicious lesions or rashes Cervical Nodes:  No lymphadenopathy noted Axillary Nodes:  No palpable lymphadenopathy   Impression & Recommendations:  Problem # 1:  CHANGE IN BOWELS (ZOX-096.04) Assessment New  Pt's symptoms do not present like malginant process, esp in light of weight gain. He had colonoscopy in  2007 that was normal.  I have given pt stool cards to take home and bring back to [place].    Orders: [facility]- Est  Level 4 (54098)  Problem # 2:  GLUCOSE INTOLERANCE, HX OF (ICD-V12.2) Assessment: Unchanged  Pt has history of glucose intolerance.  Last fasting glucose <100.  Given recent increased in voiding, will check A1C.  Orders: [facility]- Est  Level 4 (11914)  Problem # 3:  OVERWEIGHT (ICD-278.02) Assessment: Deteriorated  Gained 10 lbs since last OV 10/2008.  Pt has not been exercising as previous, not since Nov.  Advised to resume exercise now that weather is better.  Will check labs.   Orders: [facility]- Est  Level 4 (99214)  Problem # 4:  ANGINA, STABLE (ICD-413.9) Assessment: Unchanged  Pt has history of glucose intolerance.  He is presenting with symptoms of increased voiding x months.  Will check A1C today.       His updated medication list for this problem includes:    Adult Aspirin Ec Low Strength 81 Mg Tbec (Aspirin) .[name]... 1 by mouth daily    Nitroglycerin 0.4 Mg Subl (Nitroglycerin) .[name]... Take one every five minutes when experiencing chest pain; call 911 if chest pain not better after 3 tabs    Aspirin Adult Low Strength 81 Mg Tbec (Aspirin) ..... By mouth daily  His updated medication list for this problem includes:    Adult Aspirin Ec Low Strength 81 Mg Tbec (Aspirin) .[name]... 1 by mouth daily    Nitroglycerin 0.4 Mg Subl (Nitroglycerin) .[name]... Take one every five minutes when experiencing chest pain; call 911 if chest pain not better after 3 tabs    Aspirin Adult Low Strength 81 Mg Tbec (Aspirin) ..... By mouth daily  Orders: [facility]- Est  Level 4 (99214)  Problem # 5:  GLUCOSE INTOLERANCE, HX OF (ICD-V12.2)  Will check A1C today as pt is having increased voiding.  Previous A1C has been less than 6.  Pt has history of glucose intolerance.    Orders: [facility]- Est  Level 4 (99214)  Complete Medication List: 1)  Propranolol Hcl 80 Mg Tabs (propranolol Hcl)  .... Take 1 tablet by mouth twice daily 2)  Zocor 80 Mg Tabs (Simvastatin) .... Take 1 tablet by mouth at bedtime 3)  Primidone 250 Mg Tabs (Primidone) .... By mouth one tablet two times a day 4)  Prilosec Otc 20 Mg Tbec (Omeprazole magnesium) .[name].. 1 tab by mouth daily 5)  Mupirocin 2 % Oint (Mupirocin) .... Apply to nasal area and underfinger nails.  20 mls 6)  Adult Aspirin Ec Low Strength 81 Mg Tbec (Aspirin) .[name].. 1 by mouth daily 7)  Fish Oil Oil (Fish oil) 8)  Ativan 0.5 Mg Tabs (Lorazepam) .... By mouth one table daily as needed for anxiety 9)  Nitroglycerin 0.4 Mg Subl (Nitroglycerin) .... Take one every five minutes when experiencing chest pain; call 911 if chest pain not better after 3 tabs 10)  Aspirin Adult Low Strength 81 Mg Tbec (Aspirin) .... By mouth daily  Other Orders: Lipid-[facility] [phone]) Comp Met-[facility]  [phone]) CBC-[facility] (24401) PSA-[facility] [id number]) A1C-[facility] [phone])  Patient Instructions: 1)  Please schedule a follow-up appointment in 6 months for f/u.  2)  I've given you stool cards to send back to our lab. 3)  I will run some labs today and will call you with the result. 4)  You have not been exercising like before.  5)  It is important that you exercise reguarly at least 20 minutes 5 times a week. If you develop chest pain, have severe difficulty breathing, or feel very tired, stop exercising immediately and seek medical attention.  6)  You need to lose weight. Consider a lower calorie diet and regular exercise.    Prevention & Chronic Care Immunizations   Influenza vaccine: Fluvax MCR  (01/29/2009)   Influenza vaccine due: 02/06/2009    Tetanus booster: 05/18/2002: Done.   Tetanus booster due: 05/18/2012    Pneumococcal vaccine: Done.  (11/15/1996)   Pneumococcal vaccine due: None    H. zoster vaccine: Not documented  Colorectal Screening   Hemoccult: Done.  (09/21/2005)   Hemoccult action/deferral: Ordered  (07/14/2009)   Hemoccult due: Not Indicated    Colonoscopy: Done.  (09/21/2005)   Colonoscopy due: 09/22/2015  Other Screening   PSA: 1.00  (06/30/2008)   PSA ordered.   PSA due due: 06/30/2009   Smoking status: quit  (07/14/2009)  Lipids   Total Cholesterol: 142  (06/30/2008)   LDL: 81  (06/30/2008)   LDL Direct: 84  (08/30/2006)   HDL: 37  (06/30/2008)   Triglycerides: 118  (06/30/2008)    SGOT (AST): 17  (06/30/2008)   SGPT (ALT): 22  (06/30/2008) CMP ordered    Alkaline phosphatase: 105  (06/30/2008)   Total bilirubin: 0.6  (06/30/2008)    Lipid flowsheet reviewed?: Yes   Progress toward LDL goal: At goal  Hypertension   Last Blood Pressure: 130 / 77  (07/14/2009)   Serum creatinine: 1.02  (06/30/2008)   BMP action: Ordered   Serum potassium 5.0  (06/30/2008) CMP ordered     Hypertension flowsheet reviewed?: Yes   Progress toward BP  goal: At goal  Self-Management Support :   Personal Goals (by the  next clinic visit) :      Personal blood pressure goal: 130/80  (07/14/2009)     Personal LDL goal: 100  (07/14/2009)    Hypertension self-management support: Written self-care plan  (07/14/2009)   Hypertension self-care plan printed.    Lipid self-management support: Written self-care plan  (07/14/2009)   Lipid self-care plan printed.  Laboratory Results   Blood Tests   Date/Time Received: July 14, 2009 11:21 AM  Date/Time Reported: July 14, 2009 11:59 AM   HGBA1C: 5.5%   (Normal Range: Non-Diabetic - 3-6%   Control Diabetic - 6-8%)  Comments: ...........test performed by............Marland KitchenBAJordan, MT(ASCP)11:59 AM entered by Terese Door, CMA

## 2010-05-19 NOTE — Assessment & Plan Note (Signed)
Summary: LEG AND FOOT SWELLING/KH   Vital Signs:  Patient profile:   70 year old male Height:      65.5 inches Weight:      197 pounds BMI:     32.40 Pulse rate:   57 / minute BP sitting:   130 / 70  (right arm)  Vitals Entered By: Arlyss Repress CMA, (September 27, 2009 8:40 AM) CC: swollen legs x 1-2 weeks. Is Patient Diabetic? No Pain Assessment Patient in pain? no        Primary Care Provider:  Janus Vlcek MD  CC:  swollen legs x 1-2 weeks.[name]  History of Present Illness: 70 y/o M with   R leg swelling for 1-2 wks.  Better today, edea started decreasing over weekend.  R leg was painful when it was swollen.  He does not recall injury.  No fever, chills, redness, dyspnea, chest pain, nausea, vomiting, diaphoresis.  During the weekend prior to this the patient was working in his yard and wondered if something may have bitten him.  No pruritic rash.  He has been sitting in his recliner chair, but is not sure if he has kept the legs elevated higher than his heart.  No difficulty walking.   No history of heart failure.     Habits & Providers  Alcohol-Tobacco-Diet     Tobacco Status: quit     Tobacco Counseling: to remain off tobacco products  Current Medications (verified): 1)  Propranolol Hcl 80 Mg Tabs (Propranolol Hcl) .... Take 1 Tablet By Mouth Twice Daily 2)  Zocor 80 Mg Tabs (Simvastatin) .... Take 1 Tablet By Mouth At Bedtime 3)  Primidone 250 Mg Tabs (Primidone) .... By Mouth One Tablet Two Times A Day 4)  Prilosec Otc 20 Mg  Tbec (Omeprazole Magnesium) .[name].. 1 Tab By Mouth Daily 5)  Mupirocin 2 %  Oint (Mupirocin) .... Apply To Nasal Area and Underfinger Nails.  20 Mls 6)  Adult Aspirin Ec Low Strength 81 Mg  Tbec (Aspirin) .[name].. 1 By Mouth Daily 7)  Fish Oil   Oil (Fish Oil) 8)  Ativan 0.5 Mg Tabs (Lorazepam) .... By Mouth One Table Daily As Needed For Anxiety 9)  Nitroglycerin 0.4 Mg Subl (Nitroglycerin) .... Take One Every Five Minutes When Experiencing Chest Pain; Call 911  If Chest Pain Not Better After 3 Tabs 10)  Aspirin Adult Low Strength 81 Mg Tbec (Aspirin) .... By Mouth Daily  Allergies (verified): 1)  ! Lisinopril  Directives: 1)  Has Living Will That States That Wife, Neilson Oehlert, Is Health Care Poa If Pt Cannot Make Decisions. 2)  Full Code, But Pt Does Not Want To Remain On Artificial Life Support Indefinitely.   Past History:  Past Medical History: Last updated: 03/29/2009 Completed cardiac Rehab X 1, begun again 5/04, hx gastritis, Nonspecific insomnia, Patient with strong h/o of alcohol abuse (remote), post op anemia Hb 11.1 when iron started 09/23/02. Benign Essential Tremors Negative Stress test 03/23/2009 by Dr Lynnea Ferrier  Cardiologist: Dr Caprice Kluver  Eye doctor:  Neldon Labella on [address].  Eye exam 5-09/2007.  Family History: Last updated: 06/14/2006 No MI, CA, DM.  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  No tob, drugs.  Married to May Birch; Little--cards; Willis--neuro; Zenaida Niece Trigt-CVTS; Shoemaker-ENT; Evette Cristal - GI  Code status:  Full Code, but pt does not want to remain on artificial life support indefinitely. Has  Living Will.  Wife, Benz Vandenberghe, is POA if pt is incapacitated.  Risk Factors: Smoking Status: quit (09/27/2009)  Past Surgical History: CABG - 06/16/2002, Cardiac Cath - LAD dz, EF 60% - 07/03/2000,  Cardiac Cath - Single vessel disease - 11/16/1995, Cardiolite - 07/28/2003,  Cardiolite - EF 56%, no ischemia, thickening - 07/03/2000,  L shoulder orthoscopic surg - 06/26/2003,  pre- DM fasting 110 02/2006 - 05/25/2006,  TSH 2.469 - 07/01/2003 Negative stress test12/7/10 by Dr Lynnea Ferrier  Review of Systems       per hpi  Physical Exam  General:  Well-developed,well-nourished,in no acute distress; alert,appropriate and cooperative throughout examination. vitals reviewed.   Lungs:  Normal respiratory effort, chest expands symmetrically. Lungs are  clear to auscultation, no crackles or wheezes. Heart:  Normal rate and regular rhythm. S1 and S2 normal without gallop, murmur, click, rub or other extra sounds. Abdomen:  Bowel sounds positive,abdomen soft and non-tender without masses, organomegaly or hernias noted. Msk:  R calf diameter: 41cm L calf diameter: 40cm No redness.  No ecchymosis.  No erythema.  No hematoma.  No purpura.   Neurologic:  No cranial nerve deficits noted. Station and gait are normal. Plantar reflexes are down-going bilaterally. DTRs are symmetrical throughout. Sensory, motor and coordinative functions appear intact.   Impression & Recommendations:  Problem # 1:  LEG EDEMA, RIGHT (ICD-782.3) No history of HF.  Cardiolite in 2005 that showed EF 56%.  Besides RLE edema, pt is asymptomatic.  Concern for DVT.  Will get LE dopler.  If negative, will need to monitor for recurrence.  If positive, will need Lovenox and coumadin.  This was explained to pt, and he is in agreement.   Orders: LE Venous Duplex (DVT) (DVT) [facility]- Est Level  3 (11914)  Complete Medication List: 1)  Propranolol Hcl 80 Mg Tabs (propranolol Hcl)  .... Take 1 tablet by mouth twice daily 2)  Zocor 80 Mg Tabs (Simvastatin) .... Take 1 tablet by mouth at bedtime 3)  Primidone 250 Mg Tabs (Primidone) .... By mouth one tablet two times a day 4)  Prilosec Otc 20 Mg Tbec (Omeprazole magnesium) .[name].. 1 tab by mouth daily 5)  Mupirocin 2 % Oint (Mupirocin) .... Apply to nasal area and underfinger nails.  20 mls 6)  Adult Aspirin Ec Low Strength 81 Mg Tbec (Aspirin) .[name].. 1 by mouth daily 7)  Fish Oil Oil (Fish oil) 8)  Ativan 0.5 Mg Tabs (Lorazepam) .... By mouth one table daily as needed for anxiety 9)  Nitroglycerin 0.4 Mg Subl (Nitroglycerin) .... Take one every five minutes when experiencing chest pain; call 911 if chest pain not better after 3 tabs 10)  Aspirin Adult Low Strength 81 Mg Tbec (Aspirin) .... By mouth daily

## 2010-07-13 ENCOUNTER — Other Ambulatory Visit: Payer: Medicare Other

## 2010-07-13 NOTE — Progress Notes (Signed)
Pt is not allowed another lipid draw until after March 30th per medicare. Pt rescheduled for April 2nd. AC

## 2010-07-18 ENCOUNTER — Other Ambulatory Visit: Payer: Medicare Other

## 2010-07-18 DIAGNOSIS — E782 Mixed hyperlipidemia: Secondary | ICD-10-CM

## 2010-07-18 LAB — LIPID PANEL
Cholesterol: 124 mg/dL (ref 0–200)
HDL: 28 mg/dL — ABNORMAL LOW (ref >39)
LDL Cholesterol: 71 mg/dL (ref 0–99)
Total CHOL/HDL Ratio: 4.4 Ratio
Triglycerides: 125 mg/dL (ref <150)
VLDL: 25 mg/dL (ref 0–40)

## 2010-07-18 NOTE — Progress Notes (Signed)
DREW FLP BAJORDAN, MLS

## 2010-07-19 ENCOUNTER — Encounter: Payer: Self-pay | Admitting: Home Health Services

## 2010-07-21 ENCOUNTER — Ambulatory Visit (INDEPENDENT_AMBULATORY_CARE_PROVIDER_SITE_OTHER): Payer: Medicare Other | Admitting: Family Medicine

## 2010-07-21 ENCOUNTER — Encounter: Payer: Self-pay | Admitting: Family Medicine

## 2010-07-21 VITALS — BP 143/80 | HR 80 | Temp 98.2°F | Ht 65.0 in | Wt 194.0 lb

## 2010-07-21 DIAGNOSIS — R32 Unspecified urinary incontinence: Secondary | ICD-10-CM | POA: Insufficient documentation

## 2010-07-21 DIAGNOSIS — K219 Gastro-esophageal reflux disease without esophagitis: Secondary | ICD-10-CM

## 2010-07-21 DIAGNOSIS — E782 Mixed hyperlipidemia: Secondary | ICD-10-CM

## 2010-07-21 DIAGNOSIS — I10 Essential (primary) hypertension: Secondary | ICD-10-CM

## 2010-07-21 DIAGNOSIS — I25810 Atherosclerosis of coronary artery bypass graft(s) without angina pectoris: Secondary | ICD-10-CM

## 2010-07-21 DIAGNOSIS — G250 Essential tremor: Secondary | ICD-10-CM

## 2010-07-21 DIAGNOSIS — G252 Other specified forms of tremor: Secondary | ICD-10-CM

## 2010-07-21 LAB — POCT URINALYSIS DIPSTICK
Bilirubin, UA: NEGATIVE
Glucose, UA: NEGATIVE
Ketones, UA: NEGATIVE
Leukocytes, UA: NEGATIVE
Nitrite, UA: NEGATIVE
Protein, UA: 30
Spec Grav, UA: 1.025
Urobilinogen, UA: 0.2
pH, UA: 5.5

## 2010-07-21 LAB — POCT UA - MICROSCOPIC ONLY

## 2010-07-21 LAB — PSA: PSA: 1.11 ng/mL (ref <=4.00)

## 2010-07-21 MED ORDER — OMEPRAZOLE MAGNESIUM 20 MG PO TBEC: 20.0000 mg | DELAYED_RELEASE_TABLET | Freq: Every day | ORAL | Status: DC

## 2010-07-21 MED ORDER — MUPIROCIN 2 % EX OINT: TOPICAL_OINTMENT | Freq: Every day | CUTANEOUS | Status: DC

## 2010-07-21 MED ORDER — LORAZEPAM 0.5 MG PO TABS: 0.5000 mg | ORAL_TABLET | Freq: Every day | ORAL | Status: DC | PRN

## 2010-07-21 NOTE — Progress Notes (Signed)
  Subjective:    Patient ID: Brendan [name], male    DOB: [birth year], 70 y.o.   MRN: [id number]  HPI  Urinary Incontinence: Patient complains of urinary incontinence. This has been present for a while. He leaks urine with sometimes in the night. Patient describes the symptoms as  frequent urination (5x per day) and nocturia 3 times per night.  Factors associated with symptoms include none known. Evaluation to date includes none.Treatment to date includes none.   Hypertension: Patient here for follow-up of elevated blood pressure. He is not exercising and is adherent to low salt diet.  Blood pressure is well controlled at home. Cardiac symptoms exertional chest pressure/discomfort. Patient denies chest pain, chest pressure/discomfort, dyspnea, orthopnea, palpitations and syncope.  Cardiovascular risk factors: advanced age (older than 25 for men, 75 for women), dyslipidemia, hypertension and male gender. Use of agents associated with hypertension: none. History of target organ damage: angina/ prior myocardial infarction and previous CABG..  Hyperlipidemia: Patient presents with hyperlipidemia.  He was tested because CAD.  His last labs showed Total cholesterol of 124, HDL 28, LDL 71,  Triglycerides 125. negative. There is not a family history of hyperlipidemia. There is not a family history of early ischemia heart disease.  Taking Zocor 80mg  daily.  Essential Tremors: Taking Primidone 250mg  bid, Propanolol 80mg  daily.  Tremors at rest.  Sometimes bothersome when he is trying to hold or pick up something.  He has some facial tremors that are not bothersome.   Review of Systems  Constitutional: Negative for fever, chills and appetite change.  Respiratory: Negative for cough and shortness of breath.   Cardiovascular: Negative for chest pain, palpitations and leg swelling.  Gastrointestinal: Negative for nausea, vomiting, abdominal pain, diarrhea and constipation.  Genitourinary: Negative for urgency,  hematuria, decreased urine volume, discharge, scrotal swelling, difficulty urinating and genital sores.  Musculoskeletal: Negative for back pain and joint swelling.       Objective:   Physical Exam  Constitutional: He appears well-developed and well-nourished. No distress.  HENT:  Head: Normocephalic and atraumatic.  Neck: Normal range of motion. Neck supple.  Cardiovascular: Normal rate, regular rhythm and normal heart sounds.   No murmur heard. Pulmonary/Chest: Effort normal and breath sounds normal. He has no wheezes.  Abdominal: Soft. Bowel sounds are normal. There is no tenderness.       Abd hernia  Genitourinary: Rectum normal and prostate normal.  Musculoskeletal: Normal range of motion.       B/L hand tremors at rest.  Lymphadenopathy:    He has no cervical adenopathy.  Skin: Skin is warm and dry.  Psychiatric: He has a normal mood and affect.          Assessment & Plan:

## 2010-07-22 ENCOUNTER — Encounter: Payer: Self-pay | Admitting: Family Medicine

## 2010-07-22 NOTE — Assessment & Plan Note (Signed)
BP and FLP at goal.  Pt does not have DM.

## 2010-07-22 NOTE — Assessment & Plan Note (Signed)
Pt is tolerating benign tremors well.  Will continue Primidone and Propanolol. Pt does not want to increase dose of Primidone.

## 2010-07-22 NOTE — Assessment & Plan Note (Signed)
UA negative for infection.  There is microscopic hematuria.  Pt is hestitant to add another medication as frequency is not bothersome to him.  Will need to recheck UA for hematuria, given past medical history of tobacco use.

## 2010-07-22 NOTE — Assessment & Plan Note (Signed)
BP stable.  Pt is on Propanolol 80mg  daily (to help control tremors).  Renal fxn wnl.  No changes.

## 2010-07-22 NOTE — Assessment & Plan Note (Signed)
FLP 07/2010 is at goal with LDL 71, HDL 28, Trig 125. Will cont Zocor 80mg  as pt is tolerating it well without S/E.

## 2010-07-22 NOTE — Assessment & Plan Note (Signed)
Stable on Prilosec

## 2010-07-25 ENCOUNTER — Telehealth: Payer: Self-pay | Admitting: Family Medicine

## 2010-07-25 NOTE — Telephone Encounter (Signed)
Called pt re lab result.  Pt will make appt for lab for UA for cytology since last UA had trace blood and pt has remote tobacco use history.

## 2010-07-26 ENCOUNTER — Other Ambulatory Visit: Payer: Self-pay | Admitting: Family Medicine

## 2010-07-26 ENCOUNTER — Other Ambulatory Visit (HOSPITAL_COMMUNITY)
Admission: RE | Admit: 2010-07-26 | Discharge: 2010-07-26 | Disposition: A | Discharge status: Home / Self Care | Payer: Medicare Other | Source: Ambulatory Visit | Attending: Family Medicine | Admitting: Family Medicine

## 2010-07-26 ENCOUNTER — Other Ambulatory Visit: Payer: Medicare Other

## 2010-07-26 DIAGNOSIS — R319 Hematuria, unspecified: Secondary | ICD-10-CM | POA: Insufficient documentation

## 2010-07-26 DIAGNOSIS — R32 Unspecified urinary incontinence: Secondary | ICD-10-CM | POA: Insufficient documentation

## 2010-07-26 NOTE — Progress Notes (Signed)
Urine voided for cytology Dewitt Hoes, MLS (ASCP)cm

## 2010-07-29 ENCOUNTER — Telehealth: Payer: Self-pay | Admitting: Family Medicine

## 2010-07-29 NOTE — Telephone Encounter (Signed)
Called pt Re urine cytology: no malignant cells.  PSA wnl.

## 2010-09-02 NOTE — Cardiovascular Report (Signed)
[place]. [facility]  Patient:    Brendan [name], Brendan [name]                      MRN: [id number] Proc. Date: 06/20/00 Adm. Date:  [id number] Attending:  Garnette Scheuermann CC:         Sibyl Parr. Darrick Penna, M.D.  Cardiac Catheterization Lab   Cardiac Catheterization  INDICATIONS FOR TEST:  Mr. Maul is a 70 year old male with hyperlipidemia who was admitted after having rest pain.  It was somewhat atypical in nature, and his cardiac enzymes and EKG had been unremarkable.  He is brought to the catheterization lab for cardiac catheterization.  PROCEDURES: 1. Left heart catheterization. 2. Selective right and left coronary arteriography. 3. Ventriculography in the RAO projection.  CARDIOLOGIST:  Thereasa Solo. Little, M.D.  COMPLICATIONS:  None.  EQUIPMENT USED:  6-French Judkins configuration catheters.  DESCRIPTION OF PROCEDURE:  The patient was prepped and draped in the usual sterile fashion exposing the right groin, applying local anesthetic with 1% Xylocaine.  A Seldinger technique was employed and 6-French introducer sheath placed into the right femoral artery.  Selective right and left coronary arteriography and ventriculography in the RAO projection was performed.  RESULTS:  I.   HEMODYNAMIC MONITORING:  Central aortic pressure 125/74, left ventricular      pressure 125/15 with no aortic valve gradient noted at the time of      pullback.  II.  VENTRICULOGRAPHY: Ventriculography in the RAO projection revealed normal      left ventricular systolic function.  The end diastolic pressure was 15.      No mitral regurgitation was seen.  The ejection fraction was greater than      60%  III. CORONARY ARTERIOGRAPHY:  There was mild calcification in the LAD.      1.      1. Left main normal.      2. LAD: The LAD extended down and crossed the apex of the heart.  It gave         rise to a small first diagonal and a very large second diagonal.         There was a  myocardial bridge noted in the mid portion of the LAD.  At         the bifurcation of the LAD and second diagonal, there was an area of         narrowing of about 60% in the LAD and about 50% ostial narrowing of         the diagonal.  There was brisk distal flow.  In multiple views, this         area did not appear to be significant; but in one view, there was         concern that there may be in fact a significant lesion.  I could not         duplicate this in any other view, and there was some overlap in the         above-mentioned more significant appearing angiogram.      3. Circumflex: The Circumflex gave rise to a large OM and was free of         disease.      4. Right coronary artery: The right coronary artery was a relatively         small vessel, but it was a dominant vessel giving rise to a PDA and a  small posterolateral branch.  CONCLUSION: 1. Normal left ventricular systolic function. 2. Single-vessel left anterior descending artery disease that involves both    the left anterior descending artery and the ostium of the second diagonal.  PLAN:  At this point, I do not feel that his rest pain was related to this lesion.  I will schedule him for a Cardiolite, and if I can induce ischemia in the anterior or anterolateral wall, then I would proceed on with percutaneous intervention; otherwise, we will try him on medical therapy. DD:  06/20/00 TD:  06/20/00 Job: [id number]

## 2010-09-02 NOTE — Discharge Summary (Signed)
[place]. [facility]  Patient:    Brendan [name], Brendan [name]                      MRN: [id number] Adm. Date:  [id number] Disc. Date: [id number] Attending:  Garnette Scheuermann Dictator:   Raynelle Jan CC:         Thereasa Solo. Little, M.D.  Guadalupe Dawn, M.D.   Discharge Summary  CONSULTANTS:  Dr. Julieanne Manson with cardiology.  DISCHARGE DIAGNOSES: 1. Single-vessel coronary artery disease. 2. Hypercholesterolemia. 3. Hypertension. 4. Hiatal hernia.  DISCHARGE MEDICATIONS: 1. Aspirin 81 mg p.o. q.d. 2. Zocor 20 mg p.o. q.h.s. 3. Amoxicillin 500 mg 2 capsules twice a day. 4. Prevacid 30 mg p.o. b.i.d. 5. Biaxin 500 mg p.o. b.i.d. 6. Lopressor 25 mg half tablet p.o. b.i.d. 7. Tylenol p.r.n.  DISCHARGE DIET:  He is to be on a low fat cardiac diet.  DISCHARGE FOLLOW-UP: 1. He is to follow up with Dr. Clarene Duke three weeks from discharge and is to    call for the appointment. 2. He has an appointment with Dr. Ronne Binning for a GI evaluation on Thursday,    March 14, at 2 p.m.  PROCEDURES:  During hospitalization: 1. Cardiolite which showed no ischemia and an EF of 56%. 2. Catheterization which showed a 50-60% lesion at the bifurcation of the    LAD and the diagonal.  ADMISSION HISTORY:  Briefly, this is a 70 year old male with a history of coronary artery disease who presents with moderate to severe mid-substernal and epigastric pain for several days, that was intermittent in nature and at times accompanied with diaphoresis.  PAST MEDICAL HISTORY:  Significant for coronary artery disease with a catheterization in 1997 showing a 30% lesion in the LAD.  PHYSICAL EXAMINATION:  VITAL SIGNS:  Stable.  CARDIOVASCULAR:  He had a regular rate and rhythm.  LUNGS:  Clear.  ABDOMEN:  Slightly tender in the epigastric, but otherwise benign.  His admission EKG showed a normal sinus rhythm with a left hemifascicular block that was not significantly changed.  His  CBC was normal.  His electrolytes were unremarkable.  He did have a positive Helicobacter pylori.  HOSPITALIZATION:  He was admitted to [facility] for chest pain to rule out an MI.  Dr. Clarene Duke was consulted on the day of admission and on the sixth, took the patient to the catheterization laboratory where he was found to have single-vessel coronary artery disease at the LAD and diagonal bifurcation.  As this was relatively unchanged from his previous lesion, and as there was brisk distal flow, it was felt that this was likely not the cause of his chest pain.  To further work up the chest pain, a Cardiolite was performed to look at perfusion.  This was performed on the sixth as well, which was negative with an ejection fraction of 56%.  It was therefore felt that the patients chest pain was likely GI in nature, and he was started on Helicobacter pylori therapy, and given an appointment to follow up with Dr. Ronne Binning with gastroenterology for evaluation.  At discharge, the patient was in stable condition and doing well. DD:  08/04/00 TD:  08/06/00 Job: [id number]

## 2010-09-02 NOTE — Consult Note (Signed)
[place]. [facility]  Patient:    Brendan [name], Brendan [name]                     MRN: [id number] Proc. Date: 06/19/00 Attending:  Thereasa Solo. Little, M.D. CC:         [facility]   Consultation Report  HISTORY OF PRESENT ILLNESS:  Brendan [name] is a 70 year old male, who has a history of hyperlipidemia and has been on Zocor.  He has known coronary disease from a cardiac catheterization in 1997 that showed a 30 LAD lesion.  At that time he had a hyperdynamic left ventricle.  I have not seen him since 2000.  The patient states over the last several years he has had increasing dyspnea on exertion.  It has progressed to the point that over the last six months that he cannot walk up a flight of steps without marked breathlessness.  He is not a cigarette smoker.  His wife describes, what sounds like, sleep apnea, hard snoring, episodes of apnea, and occasionally the patient will wake up gasping for air, strangled almost like he is choking.  Sometimes there is associated acidy taste in his mouth that may be consistent with reflux.  He has had chest pain with and without activity for several months.  Today, he developed a pain in his mid abdominal region that moved up into the center of his left chest, then down his left arm.  This is associated with nausea and shortness of breath.  He was given sublingual nitroglycerin at [facility], the pain improved, and in the emergency room a second sublingual nitroglycerin and the patient became pain-free.  He had a similar pain to this last night that occurred at rest except he was markedly diaphoretic with it.  His ECG shows a sinus rhythm with a left anterior fascicular block and this is similar to the cardiogram from 2000.  His CK and troponins are negative.  PAST MEDICAL HISTORY:  His past history is pertinent for hyperlipidemia.  OUTPATIENT MEDICATIONS:  Zocor 20 mg a day and enteric-coated aspirin.  He has taken  over-the-counter H2 blockers on an as needed basis.  PAST SURGICAL HISTORY:  Prior surgical procedures include knee surgery approximately 30 years ago.  FAMILY HISTORY:  Father and mother both have heart disease.  Mother is hypertensive.  SOCIAL HISTORY:  The patient is married with two adult children.  He has an 11th grade education.  He has smoked cigarettes in the distant past but none in the last 20 years.  Occasionally drinks alcohol but not to excess.  REVIEW OF SYSTEMS:  The patient has had no dizziness, no syncope.  He has had relatively frequent nose bleeds.  He has seen no blood in his bowel movements. He sleeps in a recliner because of he has marked orthopnea and significant reflux when he lays flat.  He has had no peripheral swelling.  He is unaware of any arrhythmias.  He has had no dizziness, no syncope and no headache.  He has no prior history of heart disease.  His weight has been stable.  He is active but does not exercise regularly.  He has had no fevers, chills, night sweats and he has had no wheezing.  PHYSICAL EXAMINATION:  VITAL SIGNS:  His blood pressure is 135/74, heart rate 64, respirations 18, O2 saturation is 98%.  The patient is completely pain-free.  SKIN:  Warm and dry.  HEENT:  Unremarkable.  Thyroid  is not palpable.  No carotid bruits.  No JVD. The posterior pharynx is normal.  LUNGS:  Clear.  Forced expiration does not induce a wheeze.  CARDIAC:  Regular rhythm with no murmur, PMI and has normal location.  PULSES:  He has +2 pulses in the upper and lower extremities and no abdominal or femoral bruits.  ABDOMEN:  Obese, soft, nontender except in the midabdominal area where there is mild tenderness with no guarding or rebound.  There is no tenderness over his gallbladder.  He has normal hair distribution.  No CVA tenderness.  EXTREMITIES:  There is no clubbing or cyanosis of the nail beds.  ASSESSMENT: 1. Chest pain, angina versus  gastrointestinal, difficult to tell but    with his increasing dyspnea for no apparent reason, I have scheduled    him for left catheterization in the morning.  He is not on heparin    therapy because of recurrent nose bleeds and his gastrointestinal symptoms,    although his stool is hemoccult negative per [facility].  I will not    start anticoagulants unless he has more pain or has documented    coronary artery disease.  If his catheterization is unremarkable, I would    clearly recommend a gastrointestinal evaluation since with this    degree of reflux, if this is in fact what is happening, he could clearly    get esophageal strictures. 2. Hyperlipidemia, on Zocor.  Lipid levels are pending. 3. Questionable sleep apnea. 4. Questionable h.s. aspiration pneumonia versus h.s. shortness of breath. DD: 06/19/00 TD:  06/20/00 Job: [id number]

## 2010-09-02 NOTE — Op Note (Signed)
NAME:  Brendan [name], Brendan [name] NO.:  [id number]   MEDICAL RECORD NO.:  [id number]                   PATIENT TYPE:  OIB   LOCATION:  2912                                 FACILITY:  [facility]   PHYSICIAN:  Elana Alm. Thurston Hole, M.D.              DATE OF BIRTH:  [birth year]   DATE OF PROCEDURE:  04/17/2003  DATE OF DISCHARGE:                                 OPERATIVE REPORT   PREOPERATIVE DIAGNOSES:  1. Left shoulder adhesive capsulitis with partial rotator cuff tear and     partial glenoid labrum tear with impingement.  2. Right shoulder rotator cuff tendinitis.   POSTOPERATIVE DIAGNOSES:  1. Left shoulder adhesive capsulitis with partial rotator cuff tear and     partial glenoid labrum with impingement.  2. Right shoulder rotator cuff tendinitis.   PROCEDURE:  1. Left shoulder examination under anesthesia followed by manipulation.  2. Left shoulder arthroscopic lysis of adhesions.  3. Left shoulder arthroscopic partial rotator cuff tear, debridement and     partial labrum tear and debridement.  4. Right shoulder cortisone injection.   SURGEON:  Elana Alm. Thurston Hole, M.D.   ASSISTANT:  Julien Girt, P.A.   ANESTHESIA:  General.   OPERATIVE FINDINGS:  45 minutes.   COMPLICATIONS:  None.   INDICATIONS:  Brendan [name] is a 70 year old gentleman who has had a year of  left shoulder pain and about three months of right shoulder pain with exam  and MRI documenting rotator cuff tendinitis with a partial tear and adhesive  capsulitis.  He has failed conservative care and is now to undergo  arthroscopy and manipulation.  He has also had significant increased right  shoulder pain recently from favoring his left shoulder and is to undergo a  right shoulder injection.   DESCRIPTION OF PROCEDURE:  Brendan [name] was brought to the operating room on  March 31, 2003, placed on the operative table in the supine position.  After an adequate level of general anesthesia,  first his right shoulder was  examined under anesthesia.  He had full range of motion of the shoulder with  stable ligamentous exam.  His left shoulder was examined.  Initial range of  motion, forward flexion of 120, abduction of 100.  Internal and external  rotation of 40 degrees.  After this was done, a gentle manipulation was  carried out breaking up adhesions and improving forward flexion to 175,  abduction 175, internal and external rotation of 90 degrees.  Shoulder  remained stable ligamentous exam.  The left shoulder was then prepped using  sterile DuraPrep and draped using sterile technique.  Initially, the  arthroscopy was performed through the posterior arthroscope portal.  The  arthroscope with a pump attached was placed and through an anterior portal  the arthroscope probe was placed. On initial inspection the articular  cartilage and the glenohumeral joint was intact.  There was a large  hemarthrosis secondary  to the manipulation, and this was evacuated.  Partial  tearing of the anterior and superior labrum 25-30% which was debrided.  Biceps tendon anchor was intact, and the biceps tendon was intact.  The  inferior labrum and the anterior inferior glenohumeral ligament complex were  intact but significantly hemorrhagic.  The rotator cuff showed a partial  tear of 50% of the infraspinatus which was debrided.  The supraspinatus and  subscapularis and teres minor were intact.  The inferior capsular recess  showed significant hemorrhagic synovitis which was debrided, and no other  pathology noted there.  After this was done, the subacromial space was  entered and a lateral arthroscopic portal was made.  Moderately thickened  bursitis was resected.  Rotator cuff was intact on the bursal side.  Impingement was noted and a subacromial decompression was carried out  removing 6-8 mm of the undersurface of the anterior, anterior lateral and  anterior median acromion and CA ligament  release carried out as well.  The  [facility] joint was not disturbed.  After this was done, the shoulder could be  brought through a full range of motion with no impingement on the rotator  cuff.  At this point, it was felt that all pathology had been satisfactorily  addressed.  Of note, is that the right shoulder subacromial space had been  injected under sterile conditions as well with Depo-Medrol and Marcaine into  the subacromial space.  After this was done, the left shoulder was injected  with 0.25% Marcaine with epinephrine.  Sterile dressings were applied after  the portals were closed with 3-0 nylon suture and sling applied.  The  patient was then awakened, extubated and taken to the recovery room in  stable condition.  Needle and sponge counts were correct x2 at the end of  the case.                                               Robert A. Thurston Hole, M.D.    RAW/MEDQ  D:  03/31/2003  T:  03/31/2003  Job:  [id number]

## 2010-09-02 NOTE — Op Note (Signed)
NAME:  Brendan [name], STIREWALT NO.:  [id number]   MEDICAL RECORD NO.:  [id number]                   PATIENT TYPE:  OIB   LOCATION:  4743                                 FACILITY:  [facility]   PHYSICIAN:  Robert A. Thurston Hole, M.D.              DATE OF BIRTH:  Oct 03, 1940   DATE OF PROCEDURE:  09/22/2003  DATE OF DISCHARGE:  09/23/2003                                 OPERATIVE REPORT   PREOPERATIVE DIAGNOSIS:  Right shoulder adhesive capsulitis with rotator  cuff tendinitis and impingement, with partial labrum tear.   POSTOPERATIVE DIAGNOSIS:  Right shoulder adhesive capsulitis with rotator  cuff tendinitis and impingement, with partial labrum tear.   PROCEDURES:  1. Right shoulder examination under anesthesia, followed by manipulation.  2. Right shoulder arthroscopy with lysis of adhesions.  3. Right shoulder arthroscopic debridement of partial labrum tear.  4. Right shoulder subacromial decompression.   SURGEON:  Elana Alm. Thurston Hole, M.D.   ASSISTANT:  Julien Girt, P.A.   OPERATIVE TIME:  45 minutes.   COMPLICATIONS:  None.   DESCRIPTION OF PROCEDURE:  Mr. Lall was brought to the operating room on  September 22, 2003, after an interscalene block had been placed in the holding  room by anesthesia for postoperative pain control.  He was placed on the  operating table in the supine position.  After being placed under general  anesthesia, his right shoulder was examined.  Initial range of motion showed  forward flexion to 100 degrees, abduction to 100 degrees, internal and  external rotation of 50 degrees.  A gentle manipulation was carried out,  breaking up adhesions and improving forward flexion to 175 degrees,  abduction to 175, internal and external rotation of 85 degrees.  The  shoulder remained stable to ligamentous exam.  He was then placed in a beach  chair position and his shoulder and arm were prepped using sterile Duraprep  and draped using sterile  technique.  Originally through a posterior  arthroscopic portal the arthroscope with a pump attached was placed, and  through an anterior portal an arthroscopic probe was placed.  On initial  inspection the articular cartilage and the glenohumeral joint showed mild  grade 1 and 2 chondromalacia.  He had partial tearing of the anterior,  superior, and posterior labrum, 25-30%, which was debrided.  The superior  labrum, biceps tenon anchor was intact.  The biceps tendon was intact.  The  inferior labrum and anterior inferior glenohumeral ligament complex was  intact.  Rotator cuff on the articular surface showed inflammation but no  evidence of a tear.  Inferior capsular recess was free of pathology.  Subacromial space was entered and a lateral arthroscopic portal was made.  Moderately thickened bursitis was resected.  The rotator cuff was inflamed  and thickened on the bursal side but no evidence of a tear.  Impingement was  noted and a subacromial decompression was carried out,  removing 6-8 mm of  the undersurface of the anterior, anterolateral, and anteromedial acromion.  The CA ligament was released as well.  The [facility] joint was not disturbed.  After this was done the shoulder could be brought through a full range of  motion with no impingement on the rotator cuff.  At this point it was felt  that all pathology had been satisfactorily addressed.  The instruments were  removed.  Portals closed with 3-0 nylon suture, sterile dressings and a  sling applied, and the patient awakened and taken to the recovery room in  stable condition.   FOLLOW-UP CARE:  Mr. Sagan will be followed overnight for observation.  He  will be discharged tomorrow on Percocet for pain with early aggressive  physical therapy.  See me back in the office in a week for sutures out and  follow-up.                                               Robert A. Thurston Hole, M.D.    RAW/MEDQ  D:  09/23/2003  T:  09/23/2003  Job:   [id number]

## 2010-09-02 NOTE — Cardiovascular Report (Signed)
NAME:  Brendan [name], Brendan [name] NO.:  [id number]   MEDICAL RECORD NO.:  [id number]                   PATIENT TYPE:  OIB   LOCATION:  2854                                 FACILITY:  [facility]   PHYSICIAN:  Thereasa Solo. Little, M.D.              DATE OF BIRTH:  1941-01-18   DATE OF PROCEDURE:  06/16/2002  DATE OF DISCHARGE:                              CARDIAC CATHETERIZATION   INDICATIONS FOR PROCEDURE:  The patient is a 70 year old male who has known  coronary artery disease going back to 1997.  He has been managed medically.  He had a nuclear May 2003 that was negative for ischemia, but over the last  month presents to my office with increasing episodes of exertional chest  pain, tightness in his chest, shortness of breath, and increasing  breathlessness with normal activity.  It resolves with rest.  He is brought  in for outpatient cardiac catheterization.   DESCRIPTION OF PROCEDURE:  The patient was prepped and draped in the usual  sterile fashion exposing the right groin.  Applying local anesthetic of 1%  Xylocaine, the Seldinger technique was employed, and a 6-French introducer  sheath was placed in the right femoral artery.  Left and right coronary  arteriography, visualization of the internal mammary artery, and  ventriculography in the RAO projection was performed.   COMPLICATIONS:  None.   EQUIPMENT:  Judkins 6-French configuration catheters.   RESULTS:  1. Hemodynamic monitoring     A. Central aortic pressure 127/48.     B. Left ventricular pressure 126/8.     C. There was no significant valve gradient noted at the time of pullback.  2. Ventriculography:  Ventriculography in the RAO projection revealed normal     systolic function.  Ejection fraction was greater than 55%.  The end     diastolic pressure was 14.  3. Coronary arteriography:  On fluoroscopy, there was calcification seen in     the distribution of the proximal LAD.     A. Left main:   Normal.     B. LAD:  The LAD extended down to the apex of the heart.  It was        relatively small with about 2.5 to 2.75 mm from its mid portion down.        In the proximal portion, it was heavily calcified, and there was the        beginning of a tapering area of narrowing that ended just past the        ostium of the first diagonal.  This area was 60% to 70% narrowed.  The        ostium of the first diagonal had an area of 80% narrowing.  The        diagonal itself was relatively small.     C. Circumflex:  The circumflex had an area of mid 20% narrowing.  The OMs        were free of disease.     D. Right coronary artery:  Small, free of disease.   CONCLUSION:  1. Normal left ventricular systolic function.  2. Single-vessel disease including calcification of a long bifurcation     lesion in his left anterior descending artery that involved both the     ostium of the diagonal and the entire proximal portion of the left     anterior descending     artery.  Because of its location at the bifurcation, I have asked CVTS to     consider single-vessel, off-pump left anterior descending artery     grafting.  This could be addressed percutaneously, but it is likely at a     significant higher risk as a result of the bifurcation aspect and the     fact that it is a relatively small vessel.                                               Thereasa Solo. Little, M.D.    ABL/MEDQ  D:  06/16/2002  T:  06/16/2002  Job:  [id number]   cc:   CVTS   Cardiac Catheterization Lab

## 2010-09-02 NOTE — Op Note (Signed)
NAME:  Brendan [name], Brendan [name] NO.:  [id number]   MEDICAL RECORD NO.:  [id number]                   PATIENT TYPE:  INP   LOCATION:  2301                                 FACILITY:  [facility]   PHYSICIAN:  Mikey Bussing, M.D.           DATE OF BIRTH:  08-20-1940   DATE OF PROCEDURE:  06/30/2002  DATE OF DISCHARGE:                                 OPERATIVE REPORT   PREOPERATIVE DIAGNOSIS:  Class 3 exertional angina with severe two-vessel  coronary artery disease.   POSTOPERATIVE DIAGNOSIS:  Class 3 exertional angina with severe two-vessel  coronary artery disease.   OPERATION PERFORMED:  Coronary artery bypass grafting times three (left  internal mammary artery to the left anterior descending, saphenous vein  graft to diagonal, saphenous vein graft to obtuse marginal 2).   SURGEON:  Kerin Perna, M.D.   ASSISTANT:  Coral Ceo, P.A.   ANESTHESIA:  General.   ANESTHESIOLOGIST:  Edwin Cap. Zoila Shutter, M.D.   INDICATIONS FOR PROCEDURE:  The patient is a 70 year old white male with  exertional dyspnea.  Cardiac catheterization by Dr. Clarene Duke showed  progression in his known moderate coronary disease to high grade LAD  diagonal stenosis and moderate stenosis of the circumflex marginal.  He is  felt to be a candidate for surgical coronary revascularization.  Prior to  operation I examined the patient in the office and reviewed the results of  the cardiac catheterization with the patient and his wife.  I discussed the  indications and expected benefits of coronary artery bypass surgery for  treatment of his coronary artery disease.  I discussed the alternatives to  surgery for treatment of his coronary artery disease as well.  I reviewed  with the patient and wife the major aspects of the proposed procedure  including the location of the surgical incisions, the use of general  anesthesia and cardiopulmonary bypass, the associated risks of myocardial  infarction, CVA, bleeding, infection, and death and the expected  postoperative hospital recovery.  The patient understood these implications  for the surgery and agreed to proceed with the operating room as planned  under what I felt was an informed consent.   OPERATIVE FINDINGS:  The saphenous vein was of below average quality, taken  endoscopically from the right thigh.  The mammary was small but with good  flow.  The LAD diagonal had high grade heavily calcified proximal stenoses.   DESCRIPTION OF PROCEDURE:  The patient was brought to the operating room and  placed supine on the operating table where general anesthesia was induced  under invasive hemodynamic monitoring.  The chest, abdomen and legs were  prepped with Betadine and draped as a sterile field.  A sternal incision was  made as the saphenous vein was harvested from the right thigh.  The left  internal mammary artery was harvested as a pedicle graft from its origin at  the subclavian vessels.  Heparin was administered and the ACT was documented  as being therapeutic.  The sternal retractor was placed and through  pursestring sutures placed the ascending aorta and right atrium, the patient  was cannulated and placed on bypass and cooled to 32 degrees.  The  coronaries were identified by grafting and the mammary artery and vein  grafts were prepared for the distal anastomoses.  A cardioplegia cannula was  placed in the ascending aorta.  The ascending aorta was noted to be soft,  friable with abnormal bleeding through the cannulation site.  The patient  was cooled to 30 degrees.  As the aortic cross-clamp was applied, 500 ml of  cold blood cardioplegia was delivered to the aortic root.  There was good  cardioplegic arrest with septal temperature dropping to less than 14  degrees.  Topical iced saline slush was used to augment myocardial  preservation.  A pericardial insulator pad was used to protect the left  phrenic  nerve.   The distal coronary anastomoses were then performed.  The first distal  anastomosis was to the diagonal.  This was a 1.5 mm vessel with proximal 90%  stenosis.  A reversed saphenous vein was sewn end-to-side with running 7-0  Prolene with good flow through the graft.  The second distal anastomosis was  to the OM2.  This was a 1.8 mm vessel with proximal 50 to 60% stenosis.  A  reversed saphenous vein was sewn end-to-side with a running 7-0 Prolene with  good flow through the graft.  Cardioplegia was redosed through the vein  grafts.  The third distal anastomosis was to the mid portion of the LAD  after its high grade stenosis of 95%.  The left internal mammary artery  pedicle was brought through an opening created in the left lateral  pericardium and was brought down onto the LAD and sewn end-to-side with a  running 8-0 Prolene.  There was excellent flow through the anastomosis after  brief release of the atraumatic vascular clamp on the mammary pedicle.  The  vascular pedicle clamp was reapplied and the pedicle was secured to the  epicardium.   Cardioplegia was redosed.  The two proximal vein anastomoses were applied to  the ascending aorta while the aortic cross-clamp was still in place due to  the friability and poor quality of the ascending aorta.  A 4.0 mm punch and  running 6-0 Prolene was performed.  Air was vented from the left side of the  heart prior to releasing the cross-clamp as the last proximal anastomosis  was tied down.   The heart resumed a spontaneous rhythm.  There was bleeding from both  proximal anastomoses.  Bleeding from the circumflex proximal anastomosis was  controlled with an additional 6-0 Prolene.  The bleeding from the proximal  diagonal vein graft was difficult to control since the aortic wall quality  was very poor and the vein was very small in diameter.  It was decided to transfer the proximal anastomosis on the diagonal vein to the proximal   portion of the vein graft to the circumflex which was done after oversewing  the aortotomy with 4-0 Prolene.  An end-to-side anastomosis between the  proximal diagonal vein and the site of the vein graft to the circumflex was  performed using running 7-0 Prolene and the bulldog clamps were removed and  there was good flow through each branch.   The distal coronaries were checked and found to be hemostatic.  The patient  was rewarmed and reperfused.  Temporary pacing wires were applied.  The  lungs were re-expanded, the ventilator was resumed.  The patient was weaned  from bypass without difficulty.  Protamine was administered.  There was no  adverse reaction to the protamine.  The cannulas were removed.  The  mediastinum was irrigated with warm antibiotic irrigation.  There continued  to be significant diffuse oozing.  The patient was given a transfusion of  platelets with improved coagulation function.  The superior pericardial fat  was closed over the aorta and vein grafts.  Two mediastinal and a left  pleural chest tube were placed and then brought out through separate  incisions.  The sternum was reapproximated with interrupted steel wire.  The  pectoralis fascia was closed with interrupted #1 Vicryl.  The  subcutaneous tissue and skin were closed with a running Vicryl.  Total  bypass time was 130 minutes with aortic cross-clamp time of 60 minutes to  perform the three distal and two proximal coronary anastomoses.  The patient  returned to the ICU in stable condition.                                                Mikey Bussing, M.D.    PV/MEDQ  D:  06/30/2002  T:  06/30/2002  Job:  [id number]   cc:   CV[facility] office   Thereasa Solo. Little, M.D.  [address]  [place] [address]  Fax: [phone]

## 2010-09-02 NOTE — Discharge Summary (Signed)
NAME:  Brendan [name], Brendan [name] NO.:  [id number]   MEDICAL RECORD NO.:  [id number]                   PATIENT TYPE:  INP   LOCATION:  2021                                 FACILITY:  [facility]   PHYSICIAN:  Kerin Perna, M.D.               DATE OF BIRTH:  1940-06-22   DATE OF ADMISSION:  06/30/2002  DATE OF DISCHARGE:  07/05/2002                                 DISCHARGE SUMMARY   PRIMARY ADMITTING DIAGNOSES:  1. Severe two-vessel coronary artery disease.  2. Class III exertional angina.   ADDITIONAL/DISCHARGE DIAGNOSES:  1. Severe two-vessel coronary artery disease.  2. Class III exertional angina.  3. Hypertension.  4. Hyperlipidemia.  5. Gastrointestinal reflux disease.  6. Chronic obstructive pulmonary disease.  7. Degenerative arthritis.  8. Benign prostatic hypertrophy.   PROCEDURES PERFORMED:  1. Coronary artery bypass grafting x 3 (left internal mammary artery to the     left anterior descending, saphenous vein graft to the diagonal, saphenous     vein graft to the diagonal, saphenous vein graft to the second obtuse     marginal).  2. Endoscopic vein harvest, right thigh.   HISTORY OF PRESENT ILLNESS:  The patient is a 70 year old white male with a  remote history of mild coronary artery disease.  He had previously undergone  cardiac catheterization two to three years ago which showed mild disease and  was treated medically.  Recently he has began to experience exertional chest  pain associated with shortness of breath and radiation to his shoulders,  which is relieved by rest.  He was seen by Gaspar Garbe B. Little, M.D., and  underwent cardiac catheterization on June 16, 2002, which showed an ejection  fraction of 55% with severe two-vessel coronary artery disease.  He was not  felt to be a good candidate for percutaneous intervention, therefore a  cardiothoracic consultation was obtained.  He saw Kerin Perna, M.D., in  the office and it was  felt that he would benefit from surgical  revascularization.   HOSPITAL COURSE:  He was admitted on June 30, 2002, and taken to the  operating room where he underwent CABG x 3 with the above-noted grafts.  He  tolerated the procedure well and was transferred to the SICU in stable  condition.  He was extubated shortly after surgery.  He was hemodynamically  stable and doing well on postoperative day #1.  He remained in the ICU for  further evaluation and treatment.  He developed some mild abdominal  distention and was noted to have hypoactive bowel sounds.  He was continued  on IV fluids and his diet was slowly advanced until his bowel function  improved.  At the present, he is tolerating a regular diet and is having  normal bowel and bladder function.  By postoperative day #2, he had improved  significantly and was ready for transfer to the floor to  Duke Salvia,  M.D.  Also in the early postoperative period, he developed fevers which ran  from 100-101.2 degrees.  His white blood cell count remained stable at 6.8.  A urinalysis was obtained, which was negative.  The chest x-ray showed  bibasilar atelectasis and was felt to be the source of his fever.  He was  treated with aggressive pulmonary toilet measures and restarted on his  floredil inhaler which he took at home.  His fever resolved and since that  time he has remained afebrile.  He has remained in sinus rhythm throughout  his admission.  He has been mild volume overloaded as well and was started  on Lasix.  At the present, he has been diuresed back down to below his  preoperative weight.  He has been ambulating well with cardiac  rehabilitation phase 1.  He has been weaned off of supplemental oxygen and  is maintaining O2 saturations of greater than 95% on room air.  His surgical  incision sites are healing well.  It is felt that if he continues to remain  stable he will be ready for discharge home on July 05, 2002.    DISCHARGE MEDICATIONS:  1. Enteric-coated aspirin 325 mg daily.  2. Lanoxin 0.25 mg daily.  3. Toprol XL 25 mg daily.  4. Zocor 20 mg daily.  5. Pepcid 20 mg daily.  6. Proscar 5 mg daily.  7. Floredil b.i.d.  8. Tylox one to two q.4h. p.r.n. for pain.   ACTIVITY:  He is to refrain from driving, heavy lifting, or strenuous  activity.  He may continue daily walking.  He is encouraged to use his  incentive spirometer regularly.   WOUND CARE:  He is asked to shower daily and clean his incisions with soap  and water.   DIET:  He also may continue a low-fat, low-sodium diet.   FOLLOW-UP:  He is asked to call and schedule an appointment to see Gaspar Garbe B.  Little, M.D., in the next two weeks.  He will then follow up with Kerin Perna, M.D., on Friday, August 01, 2002, at 12 p.m.  He will have a chest x-  ray at the [facility] one prior to his appointment and  should bring these films to the CVTS office for Dr. Donata Clay to review.  He  is asked to call our office if he experiences any problems or has any  questions in the interim.     Coral Ceo, P.A.                        Kerin Perna, M.D.    GC/MEDQ  D:  07/04/2002  T:  07/06/2002  Job:  [id number]   cc:   Lucky Cowboy, M.D.  [address], [address]  [place], [place] [address]  Fax: [phone]   Thereasa Solo. Little, M.D.  [address]  [place] [address]  Fax: [phone]

## 2010-10-03 ENCOUNTER — Encounter: Payer: Self-pay | Admitting: Family Medicine

## 2010-10-03 DIAGNOSIS — F419 Anxiety disorder, unspecified: Secondary | ICD-10-CM | POA: Insufficient documentation

## 2011-01-11 ENCOUNTER — Ambulatory Visit (INDEPENDENT_AMBULATORY_CARE_PROVIDER_SITE_OTHER): Payer: Medicare Other

## 2011-01-11 DIAGNOSIS — Z23 Encounter for immunization: Secondary | ICD-10-CM

## 2011-02-27 ENCOUNTER — Encounter: Payer: Self-pay | Admitting: Emergency Medicine

## 2011-02-27 ENCOUNTER — Ambulatory Visit (INDEPENDENT_AMBULATORY_CARE_PROVIDER_SITE_OTHER): Payer: Medicare Other | Admitting: Emergency Medicine

## 2011-02-27 VITALS — BP 136/68 | HR 59 | Temp 98.1°F | Ht 67.0 in | Wt 189.0 lb

## 2011-02-27 DIAGNOSIS — M79609 Pain in unspecified limb: Secondary | ICD-10-CM

## 2011-02-27 DIAGNOSIS — Z23 Encounter for immunization: Secondary | ICD-10-CM

## 2011-02-27 DIAGNOSIS — R3129 Other microscopic hematuria: Secondary | ICD-10-CM

## 2011-02-27 DIAGNOSIS — I25810 Atherosclerosis of coronary artery bypass graft(s) without angina pectoris: Secondary | ICD-10-CM

## 2011-02-27 DIAGNOSIS — E119 Type 2 diabetes mellitus without complications: Secondary | ICD-10-CM

## 2011-02-27 DIAGNOSIS — M79604 Pain in right leg: Secondary | ICD-10-CM

## 2011-02-27 DIAGNOSIS — G250 Essential tremor: Secondary | ICD-10-CM

## 2011-02-27 LAB — POCT URINALYSIS DIPSTICK
Bilirubin, UA: NEGATIVE
Glucose, UA: NEGATIVE
Ketones, UA: NEGATIVE
Leukocytes, UA: NEGATIVE
Nitrite, UA: NEGATIVE
Protein, UA: NEGATIVE
Spec Grav, UA: 1.02
Urobilinogen, UA: 0.2
pH, UA: 5.5

## 2011-02-27 LAB — POCT UA - MICROSCOPIC ONLY

## 2011-02-27 LAB — POCT GLYCOSYLATED HEMOGLOBIN (HGB A1C): Hemoglobin A1C: 5.5

## 2011-02-27 MED ORDER — PNEUMOCOCCAL VAC POLYVALENT 25 MCG/0.5ML IJ INJ: 0.5000 mL | INJECTION | Freq: Once | INTRAMUSCULAR | Status: DC

## 2011-02-27 NOTE — Assessment & Plan Note (Signed)
Has had microscopic hematuria on two occasions now.  Will send referral to urology for further evaluation.  Will also contact the patient to let him know, as he had left before I could update him.

## 2011-02-27 NOTE — Assessment & Plan Note (Addendum)
Currently doing well with tremor.  Cardiologist did mention Parkinson's to him; however with no cogwheel rigidity and normal MMSE, this is unlikely.  He does not want to increase his medications at this time; if he changes his mind, he will contact the office.  Continue propranolol 80mg  BID and primidone 250mg  BID.

## 2011-02-27 NOTE — Assessment & Plan Note (Signed)
Likely MSK.  Not bothersome at this time.  Will monitor; can use NSAIDs as need for pain control.

## 2011-02-27 NOTE — Progress Notes (Signed)
Addended by: [place], Jenisis Harmsen on: 02/27/2011 04:38 PM   Modules accepted: Orders

## 2011-02-27 NOTE — Assessment & Plan Note (Signed)
Doing well.  No chest pain.  Seen by cardiologist earlier this month and was doing well at that time.

## 2011-02-27 NOTE — Progress Notes (Signed)
  Subjective:    Patient ID: Brendan [name], male    DOB: 09/29/1940, 70 y.o.   MRN: [id number]  HPI Brendan [name] is coming in today for follow up.  Essential Tremor: continues to have tremor in hands; left worse than right.  Present at rest and with activity.  Worse when trying to write or eat.  Currently managing well with help from wife for writing.  CAD: Saw cardiologist earlier this month; doing well.  Taking his aspirin.  BP and cholesterol are at goal.  No chest pain, leg edema.  Right leg pain: Has been experiencing a "nagging" pain that starts in his right hip and spreads down to his popliteal fossa and to the heel.  Primarily occurs when sitting for prolonged periods, especially when driving.  Resolves with walking around a bit.   Review of Systems See HPI    Objective:   Physical Exam Filed Vitals:   02/27/11 1345  BP: 136/68  Pulse: 59  Temp: 98.1 F (36.7 C)   General: alert, awake, NAD HEENT: sclera white; MMM CV: RRR, no murmurs Pulm: CTAB Neuro: pupils equal; finger-nose-finger test normal; heel to shin normal; gait normal Ext: negative straight leg raise bilaterally; negative Faber's bilaterally      Assessment & Plan:

## 2011-02-27 NOTE — Patient Instructions (Signed)
It was nice to meet you!  Everything is going well at this time.  We are not going to change any medicines.  If you decide you want to try increasing the primidone for your tremor; give the office a call and I can change the prescription for you.  You will get your pneumonia vaccine today.    Please follow up with me in 6 months or sooner as needed.

## 2011-03-01 ENCOUNTER — Telehealth: Payer: Self-pay | Admitting: *Deleted

## 2011-03-01 NOTE — Telephone Encounter (Signed)
Patient came by office today about refilling meds. He realized that he has let the refills he had expire and needs new refills sent to  [facility] for simvastatin, propranolol and primidone. This is also placed in MD box.   Does not need at this time has couple weeks worth left .

## 2011-03-08 ENCOUNTER — Other Ambulatory Visit: Payer: Self-pay | Admitting: Emergency Medicine

## 2011-03-08 MED ORDER — PRIMIDONE 250 MG PO TABS: 250.0000 mg | ORAL_TABLET | Freq: Two times a day (BID) | ORAL | Status: DC

## 2011-03-08 MED ORDER — SIMVASTATIN 80 MG PO TABS: 80.0000 mg | ORAL_TABLET | Freq: Every day | ORAL | Status: DC

## 2011-03-08 MED ORDER — PROPRANOLOL HCL 80 MG PO TABS: 80.0000 mg | ORAL_TABLET | Freq: Two times a day (BID) | ORAL | Status: DC

## 2011-03-08 NOTE — Telephone Encounter (Signed)
Prescriptions were sent electronically to [facility].

## 2011-03-15 ENCOUNTER — Encounter: Payer: Self-pay | Admitting: Home Health Services

## 2011-03-15 ENCOUNTER — Telehealth: Payer: Self-pay | Admitting: Emergency Medicine

## 2011-03-15 NOTE — Telephone Encounter (Signed)
Patient returned call to [facility].

## 2011-03-15 NOTE — Telephone Encounter (Signed)
Invoice # [phone] - betweens 9 - 5:30 Est or leave a recorded messages.  Cymvistatin clarification of the therapy and dosage for this medication.

## 2011-03-15 NOTE — Telephone Encounter (Signed)
LMOM for pt to rt call. Per [facility] pt using 90 pills of simvastatin x5-6 mo. Need to clarify w/pt before filling d/t possible side effects.

## 2011-03-15 NOTE — Telephone Encounter (Signed)
Pt returns call, states he is taking the simvastatin every day, got  A months supply at local pharmacy a few times and forgot to take it a while ago, but has been taking it  regularly for over a month with no problems. [facility] advised and will Rf Rx.

## 2011-04-28 ENCOUNTER — Other Ambulatory Visit (HOSPITAL_COMMUNITY): Payer: Self-pay | Admitting: Urology

## 2011-04-28 DIAGNOSIS — IMO0002 Reserved for concepts with insufficient information to code with codable children: Secondary | ICD-10-CM

## 2011-05-04 ENCOUNTER — Ambulatory Visit (HOSPITAL_COMMUNITY)
Admission: RE | Admit: 2011-05-04 | Discharge: 2011-05-04 | Disposition: A | Discharge status: Home / Self Care | Payer: Medicare Other | Source: Ambulatory Visit | Attending: Urology | Admitting: Urology

## 2011-05-04 DIAGNOSIS — R319 Hematuria, unspecified: Secondary | ICD-10-CM | POA: Insufficient documentation

## 2011-05-04 DIAGNOSIS — N400 Enlarged prostate without lower urinary tract symptoms: Secondary | ICD-10-CM | POA: Insufficient documentation

## 2011-05-04 DIAGNOSIS — IMO0002 Reserved for concepts with insufficient information to code with codable children: Secondary | ICD-10-CM

## 2011-05-04 DIAGNOSIS — N281 Cyst of kidney, acquired: Secondary | ICD-10-CM | POA: Insufficient documentation

## 2011-05-04 DIAGNOSIS — K8020 Calculus of gallbladder without cholecystitis without obstruction: Secondary | ICD-10-CM | POA: Insufficient documentation

## 2011-05-04 MED ORDER — GADOBENATE DIMEGLUMINE 529 MG/ML IV SOLN
18.0000 mL | Freq: Once | INTRAVENOUS | Status: AC | PRN
  Administered 2011-05-04: 18 mL via INTRAVENOUS

## 2011-08-08 ENCOUNTER — Encounter: Payer: Self-pay | Admitting: Emergency Medicine

## 2011-08-08 ENCOUNTER — Ambulatory Visit (INDEPENDENT_AMBULATORY_CARE_PROVIDER_SITE_OTHER): Payer: Medicare Other | Admitting: Emergency Medicine

## 2011-08-08 VITALS — BP 138/79 | HR 60 | Ht 67.0 in | Wt 198.0 lb

## 2011-08-08 DIAGNOSIS — E782 Mixed hyperlipidemia: Secondary | ICD-10-CM

## 2011-08-08 DIAGNOSIS — E785 Hyperlipidemia, unspecified: Secondary | ICD-10-CM

## 2011-08-08 DIAGNOSIS — R3129 Other microscopic hematuria: Secondary | ICD-10-CM

## 2011-08-08 DIAGNOSIS — I10 Essential (primary) hypertension: Secondary | ICD-10-CM

## 2011-08-08 DIAGNOSIS — N400 Enlarged prostate without lower urinary tract symptoms: Secondary | ICD-10-CM | POA: Insufficient documentation

## 2011-08-08 MED ORDER — LORAZEPAM 0.5 MG PO TABS: 0.5000 mg | ORAL_TABLET | Freq: Every day | ORAL | Status: DC | PRN

## 2011-08-08 MED ORDER — NITROGLYCERIN 0.4 MG SL SUBL: 0.4000 mg | SUBLINGUAL_TABLET | SUBLINGUAL | Status: DC | PRN

## 2011-08-08 MED ORDER — OMEPRAZOLE MAGNESIUM 20 MG PO TBEC: 20.0000 mg | DELAYED_RELEASE_TABLET | Freq: Every day | ORAL | Status: DC

## 2011-08-08 NOTE — Assessment & Plan Note (Signed)
Well controlled. Continue current medications.  Will check cmet.

## 2011-08-08 NOTE — Patient Instructions (Signed)
It was nice to see you again.  Please talk to your pharmacy about the Shingles Vaccine.  I would also like you to come in for a lab appointment with Kendal Hymen for some fasting blood work.  I will call you if anything is wrong on the labs.  I will see in 6 months for follow up.

## 2011-08-08 NOTE — Assessment & Plan Note (Signed)
Well controlled.  Will continue current medication and check a fasting lipid panel, LFTs.

## 2011-08-08 NOTE — Assessment & Plan Note (Signed)
Has been seen by urology; work up negative.

## 2011-08-08 NOTE — Progress Notes (Signed)
  Subjective:    Patient ID: Brendan [name], male    DOB: 08-20-1940, 71 y.o.   MRN: [id number]  HPI Brendan [name] is here for follow up HTN, HLD.  He does not have any acute concerns.  Hypertension Well controlled: yes Compliant with medication: yes Side effects from medication: no Check BP at home: no  Chest pain: no Palpitations: no Leg edema: no  Hyperlipidemia Well controlled: yes Compliant with medication: yes Side effects: no  I have reviewed and updated the following as appropriate: allergies and current medications SHx: former smoker (quit 40 years ago)   Review of Systems See HPI    Objective:   Physical Exam BP 138/79  Pulse 60  Ht 5\' 7"  (1.702 m)  Wt 198 lb (89.812 kg)  BMI 31.01 kg/m2 Gen: alert, cooperative, NAD CV: RRR, no murmurs Pulm: CTAB, no wheezes Ext: no edema     Assessment & Plan:

## 2011-08-08 NOTE — Assessment & Plan Note (Signed)
Diagnosed at urology.  Started on Flomax daily.  PSA has been normal.

## 2011-08-10 ENCOUNTER — Other Ambulatory Visit: Payer: Medicare Other

## 2011-08-10 DIAGNOSIS — E785 Hyperlipidemia, unspecified: Secondary | ICD-10-CM

## 2011-08-10 DIAGNOSIS — I10 Essential (primary) hypertension: Secondary | ICD-10-CM

## 2011-08-10 NOTE — Progress Notes (Signed)
cmp and flp done today Brendan [name] 

## 2011-08-11 ENCOUNTER — Encounter: Payer: Self-pay | Admitting: Emergency Medicine

## 2011-08-11 LAB — COMPREHENSIVE METABOLIC PANEL
ALT: 12 U/L (ref 0–53)
AST: 15 U/L (ref 0–37)
Albumin: 4.5 g/dL (ref 3.5–5.2)
Alkaline Phosphatase: 86 U/L (ref 39–117)
BUN: 14 mg/dL (ref 6–23)
CO2: 28 mEq/L (ref 19–32)
Calcium: 9.2 mg/dL (ref 8.4–10.5)
Chloride: 106 mEq/L (ref 96–112)
Creat: 1.05 mg/dL (ref 0.50–1.35)
Glucose, Bld: 119 mg/dL — ABNORMAL HIGH (ref 70–99)
Potassium: 4.4 mEq/L (ref 3.5–5.3)
Sodium: 143 mEq/L (ref 135–145)
Total Bilirubin: 0.4 mg/dL (ref 0.3–1.2)
Total Protein: 6.7 g/dL (ref 6.0–8.3)

## 2011-08-11 LAB — LIPID PANEL
Cholesterol: 117 mg/dL (ref 0–200)
HDL: 30 mg/dL — ABNORMAL LOW (ref >39)
LDL Cholesterol: 70 mg/dL (ref 0–99)
Total CHOL/HDL Ratio: 3.9 Ratio
Triglycerides: 87 mg/dL (ref <150)
VLDL: 17 mg/dL (ref 0–40)

## 2011-08-15 ENCOUNTER — Encounter: Payer: Self-pay | Admitting: *Deleted

## 2011-09-16 DIAGNOSIS — I25709 Atherosclerosis of coronary artery bypass graft(s), unspecified, with unspecified angina pectoris: Secondary | ICD-10-CM | POA: Insufficient documentation

## 2011-09-16 DIAGNOSIS — I25810 Atherosclerosis of coronary artery bypass graft(s) without angina pectoris: Secondary | ICD-10-CM

## 2011-09-16 DIAGNOSIS — I200 Unstable angina: Secondary | ICD-10-CM

## 2011-09-16 HISTORY — DX: Atherosclerosis of coronary artery bypass graft(s) without angina pectoris: I25.810

## 2011-09-16 HISTORY — DX: Unstable angina: I20.0

## 2011-10-10 ENCOUNTER — Other Ambulatory Visit: Payer: Self-pay | Admitting: Cardiology

## 2011-10-10 ENCOUNTER — Ambulatory Visit
Admission: RE | Admit: 2011-10-10 | Discharge: 2011-10-10 | Disposition: A | Discharge status: Home / Self Care | Payer: Medicare Other | Source: Ambulatory Visit | Attending: Cardiology | Admitting: Cardiology

## 2011-10-10 DIAGNOSIS — R0602 Shortness of breath: Secondary | ICD-10-CM

## 2011-10-13 ENCOUNTER — Ambulatory Visit (HOSPITAL_COMMUNITY)
Admission: AD | Admit: 2011-10-13 | Discharge: 2011-10-13 | Disposition: A | Discharge status: Home / Self Care | Payer: Medicare Other | Source: Ambulatory Visit | Attending: Cardiology | Admitting: Cardiology

## 2011-10-13 ENCOUNTER — Encounter (HOSPITAL_COMMUNITY)
Admission: AD | Disposition: A | Discharge status: Home / Self Care | Payer: Self-pay | Source: Ambulatory Visit | Attending: Cardiology

## 2011-10-13 ENCOUNTER — Encounter (HOSPITAL_COMMUNITY): Admission: RE | Payer: Self-pay | Source: Ambulatory Visit

## 2011-10-13 ENCOUNTER — Ambulatory Visit (HOSPITAL_COMMUNITY): Admission: RE | Admit: 2011-10-13 | Payer: Self-pay | Source: Ambulatory Visit | Admitting: Cardiology

## 2011-10-13 ENCOUNTER — Encounter (HOSPITAL_COMMUNITY): Payer: Self-pay | Admitting: Cardiology

## 2011-10-13 DIAGNOSIS — E785 Hyperlipidemia, unspecified: Secondary | ICD-10-CM | POA: Insufficient documentation

## 2011-10-13 DIAGNOSIS — I10 Essential (primary) hypertension: Secondary | ICD-10-CM | POA: Insufficient documentation

## 2011-10-13 DIAGNOSIS — H409 Unspecified glaucoma: Secondary | ICD-10-CM | POA: Insufficient documentation

## 2011-10-13 DIAGNOSIS — R0602 Shortness of breath: Secondary | ICD-10-CM | POA: Insufficient documentation

## 2011-10-13 DIAGNOSIS — G252 Other specified forms of tremor: Secondary | ICD-10-CM | POA: Insufficient documentation

## 2011-10-13 DIAGNOSIS — Z9889 Other specified postprocedural states: Secondary | ICD-10-CM

## 2011-10-13 DIAGNOSIS — R079 Chest pain, unspecified: Secondary | ICD-10-CM | POA: Insufficient documentation

## 2011-10-13 DIAGNOSIS — I25810 Atherosclerosis of coronary artery bypass graft(s) without angina pectoris: Secondary | ICD-10-CM | POA: Insufficient documentation

## 2011-10-13 DIAGNOSIS — G250 Essential tremor: Secondary | ICD-10-CM | POA: Insufficient documentation

## 2011-10-13 DIAGNOSIS — I428 Other cardiomyopathies: Secondary | ICD-10-CM | POA: Insufficient documentation

## 2011-10-13 HISTORY — PX: LEFT HEART CATHETERIZATION WITH CORONARY/GRAFT ANGIOGRAM: SHX5450

## 2011-10-13 SURGERY — LEFT HEART CATHETERIZATION WITH CORONARY/GRAFT ANGIOGRAM
Anesthesia: LOCAL

## 2011-10-13 MED ORDER — SODIUM CHLORIDE 0.9 % IJ SOLN: 3.0000 mL | INTRAMUSCULAR | Status: DC | PRN

## 2011-10-13 MED ORDER — SODIUM CHLORIDE 0.9 % IV SOLN
INTRAVENOUS | Status: DC
  Administered 2011-10-13: 08:00:00 via INTRAVENOUS

## 2011-10-13 MED ORDER — NITROGLYCERIN 0.2 MG/ML ON CALL CATH LAB
INTRAVENOUS | Status: AC
  Filled 2011-10-13: qty 1

## 2011-10-13 MED ORDER — FENTANYL CITRATE 0.05 MG/ML IJ SOLN
INTRAMUSCULAR | Status: AC
  Filled 2011-10-13: qty 2

## 2011-10-13 MED ORDER — SODIUM CHLORIDE 0.9 % IJ SOLN: 3.0000 mL | INTRAMUSCULAR | Status: AC | PRN

## 2011-10-13 MED ORDER — HEPARIN (PORCINE) IN NACL 2-0.9 UNIT/ML-% IJ SOLN
INTRAMUSCULAR | Status: AC
  Filled 2011-10-13: qty 2000

## 2011-10-13 MED ORDER — ONDANSETRON HCL 4 MG/2ML IJ SOLN: 4.0000 mg | Freq: Four times a day (QID) | INTRAMUSCULAR | Status: DC | PRN

## 2011-10-13 MED ORDER — SODIUM CHLORIDE 0.9 % IV SOLN: 1.0000 mL/kg/h | INTRAVENOUS | Status: DC

## 2011-10-13 MED ORDER — SODIUM CHLORIDE 0.9 % IJ SOLN: 3.0000 mL | Freq: Two times a day (BID) | INTRAMUSCULAR | Status: DC

## 2011-10-13 MED ORDER — SODIUM CHLORIDE 0.9 % IV SOLN: 250.0000 mL | INTRAVENOUS | Status: DC | PRN

## 2011-10-13 MED ORDER — DIAZEPAM 5 MG PO TABS: 5.0000 mg | ORAL_TABLET | ORAL | Status: AC

## 2011-10-13 MED ORDER — DIAZEPAM 5 MG PO TABS
5.0000 mg | ORAL_TABLET | ORAL | Status: AC
  Administered 2011-10-13: 5 mg via ORAL
  Filled 2011-10-13: qty 1

## 2011-10-13 MED ORDER — MORPHINE SULFATE 4 MG/ML IJ SOLN: 1.0000 mg | INTRAMUSCULAR | Status: DC | PRN

## 2011-10-13 MED ORDER — MIDAZOLAM HCL 2 MG/2ML IJ SOLN
INTRAMUSCULAR | Status: AC
  Filled 2011-10-13: qty 2

## 2011-10-13 MED ORDER — LIDOCAINE HCL (PF) 1 % IJ SOLN
INTRAMUSCULAR | Status: AC
  Filled 2011-10-13: qty 30

## 2011-10-13 MED ORDER — ACETAMINOPHEN 325 MG PO TABS: 650.0000 mg | ORAL_TABLET | ORAL | Status: DC | PRN

## 2011-10-13 MED ORDER — SODIUM CHLORIDE 0.9 % IV SOLN: INTRAVENOUS | Status: AC

## 2011-10-13 NOTE — Discharge Instructions (Signed)

## 2011-10-13 NOTE — H&P (Addendum)
History and Physical Interval Note:  NAME:  Brendan [name]   MRN: [id number] DOB:  [birth year]   ADMIT DATE: 10/13/2011   10/13/2011 8:42 AM  Brendan [name] is a 71 y.o. male, pt of Dr. Clarene [name], with a h/o CABG in 2004 and a negative Myoview ST in 2010.  He was doing well until the last few weeks when he developed worsening DOE associated with chest pain that is relieved with NTG.  He is referred for cardiac catheterization to evaluated SSx c/w unstable / crescendo angina.   Past Medical History  Diagnosis Date  . Angina pectoris syndrome   . Tremor, essential     On Primidone  . Hypertension   . Hyperlipidemia   . Glaucoma   . Coronary atherosclerosis of artery bypass graft   . Hiatal hernia     GI: Dr Brendan [name]  . Hx of cardiac cath 2002    LAD disease, EF 60%.  Cardiologist is Dr Brendan [name].   Past Surgical History  Procedure Date  . Coronary artery bypass graft 06/2002    LIMA-LAD, SVG-D1, SVG-OM  . Cardiovascular stress test 03/2009    By Dr Brendan [name], Negative  . Shoulder orthoscopic surgery  06/2003   FAMHx: Non-contributory No family history on file.  SOCHx:  reports that he has quit smoking. He has never used smokeless tobacco. He reports that he drinks alcohol. He reports that he does not use illicit drugs.  ALLERGIES: Allergies  Allergen Reactions  . Lisinopril     REACTION: COUGH    HOME MEDICATIONS: Prescriptions prior to admission  Medication Sig Dispense Refill  . aspirin (LONGS ADULT LOW STRENGTH ASA) 81 MG EC tablet Take 81 mg by mouth daily.        [name] LORazepam (ATIVAN) 0.5 MG tablet Take 1 tablet (0.5 mg total) by mouth daily as needed. For anxiety  30 tablet  5  . Omega-3 Fatty Acids (FISH OIL PO) Take by mouth daily.        [name] omeprazole (PRILOSEC OTC) 20 MG tablet Take 1 tablet (20 mg total) by mouth daily.  30 tablet  5  . primidone (MYSOLINE) 250 MG tablet Take 1 tablet (250 mg total) by mouth 2 (two) times daily.  180 tablet  4  . propranolol (INDERAL) 80  MG tablet Take 1 tablet (80 mg total) by mouth 2 (two) times daily.  180 tablet  4  . simvastatin (ZOCOR) 80 MG tablet Take 1 tablet (80 mg total) by mouth at bedtime.  90 tablet  4  . Tamsulosin HCl (FLOMAX) 0.4 MG CAPS       . mupirocin (BACTROBAN) 2 % ointment Apply topically daily. apply to nasal area and underfinger nails.  20 mls  22 g  3  . nitroGLYCERIN (NITROSTAT) 0.4 MG SL tablet Place 1 tablet (0.4 mg total) under the tongue every 5 (five) minutes as needed for chest pain. call 911 if chest pain not better  30 tablet  3    PHYSICAL EXAM:Blood pressure 124/73, pulse 52, temperature 96.8 F (36 C), temperature source Oral, resp. rate 18, height 5\' 7"  (1.702 m), weight 87.091 kg (192 lb), SpO2 96.00%. General appearance: alert, cooperative, appears stated age, no distress and flat affect, baseline RUE tremor. Neck: no adenopathy, no carotid bruit, no JVD, supple, symmetrical, trachea midline and thyroid not enlarged, symmetric, no tenderness/mass/nodules Lungs: clear to auscultation bilaterally, normal percussion bilaterally and non-labored Heart: regular rate and rhythm, S1, S2 normal, no murmur, click,  rub or gallop Abdomen: soft, non-tender; bowel sounds normal; no masses,  no organomegaly Extremities: extremities normal, atraumatic, no cyanosis or edema Pulses: 2+ and symmetric Neurologic: Mental status: Alert, oriented, thought content appropriatel; CN II-XII grossly intact.  IMPRESSION & PLAN The patients' history has been reviewed, patient examined, no change in status from most recent note, stable for surgery. I have reviewed the patients' chart and labs. Questions were answered to the patient's satisfaction.    Brendan [name] has presented today for cardiac catheterization, with the diagnosis of chest pain/ unstable angina. The various methods of treatment have been discussed with the patient and family.   Risks / Complications include, but not limited to: Death, MI,  CVA/TIA, VF/VT (with defibrillation), Bradycardia (need for temporary pacer placement), contrast induced nephropathy, bleeding / bruising / hematoma / pseudoaneurysm, vascular or coronary injury (with possible emergent CT or Vascular Surgery), adverse medication reactions, infection.     After consideration of risks, benefits and other options for treatment, the patient has consented to Procedure(s):  LEFT HEART CATHETERIZATION AND CORONARY ANGIOGRAPHY +/- AD HOC PERCUTANEOUS CORONARY INTERVENTION  as a surgical intervention.   We will proceed with the planned procedure.   Brendan [name] W [facility] [address] [place], [place]  [address]  [phone]  10/13/2011 8:42 AM

## 2011-10-13 NOTE — CV Procedure (Signed)
[facility]     CARDIAC CATHETERIZATION REPORT  NAME: Brendan [name]   MRN: [id number] DOB: Nov 14, 1940   ADMIT DATE:  10/13/2011  Performing Cardiologist: Marykay Lex Primary Physician: Despina Hick, MD Primary Cardiologist:  Julieanne Manson, MD  Procedures Performed:  Left Heart Catheterization via 5 Fr Right Common Femoral Artery access  Left Ventriculography, (RAO) 12 ml/sec for 25 ml total contrast  Native Coronary Angiography  Left Internal Mammary Graft Angiography  Saphenous Vein Graft Angiography  Aortic Root Angiogram  Indication(s): Exertional Shortness of Breath and Chest Pain.  History of CAD - s/p CABG  Cardiomyopathy with EF of ~40-45%  History: 71 y.o. male pt of Dr. Clarene Duke, with a h/o CABG in 2004 and a negative Myoview ST in 2010. He was doing well until the last few weeks when he developed worsening DOE associated with chest pain that is relieved with NTG. He is referred for cardiac catheterization to evaluated SSx c/w unstable / crescendo angina.  Consent: The procedure with Risks/Benefits/Alternatives and Indications was reviewed with the patient (and family).  All questions were answered.    Risks / Complications include, but not limited to: Death, MI, CVA/TIA, VF/VT (with defibrillation), Bradycardia (need for temporary pacer placement), contrast induced nephropathy, bleeding / bruising / hematoma / pseudoaneurysm, vascular or coronary injury (with possible emergent CT or Vascular Surgery), adverse medication reactions, infection.    The patient voice understanding and agree to proceed.    Consent for signed by MD and patient with RN witness -- placed on chart.  Procedure: The patient was brought to the 2nd Floor [place] [facility] in the fasting state and prepped and draped in the usual sterile fashion for Right groin access. Sterile technique was used including antiseptics, cap, gloves, gown, hand  hygiene, mask and sheet.  Skin prep: Chlorhexidine;  Time Out: Verified patient identification, verified procedure, site/side was marked, verified correct patient position, special equipment/implants available, medications/allergies/relevent history reviewed, required imaging and test results available.  Performed  The right femoral head was identified using tactile and fluoroscopic technique.  The right groin was anesthetized with 1% subcutaneous Lidocaine.  The right Common Femoral Artery was accessed using the Modified Seldinger Technique with placement of a antimicrobial bonded/coated single lumen (5 Fr) sheath.  The sheath was aspirated and flushed.    A 5 Fr JL4 followed by a JR4 Catheter were advanced of over a Standard J wire into the ascending Aorta.  The catheters were used to engage the Left Coronary Artery followed by the Left Internal Mammary Artery, Right Coronary Artery as well as SVG-OM.  Multiple cineangiographic views were performed.   The JR4 catheter was then exchanged over the standard J wire for an angled Pigtail catheter that was advanced across the Aortic Valve.  LV hemodynamics were measured (and) Left Ventriculography was performed.  LV hemodynamics were then re-sampled, and the catheter was pulled back across the Aortic Valve for measurement of "pull-back" gradient.  The catheter was then pulled back into the Aortic Root and an Aortic Root Angiogram was performed in an attempt to visualize the SVG-Diagonal.   Next an LCB catheter was used, but was unable to engage what was thought to be a "stump" of the SVG-Diagonal. The catheter and wire wereremoved completely out of the body.  The patient was transferred to the holding area where the sheath was removed with manual pressure held for hemostasis.    The patient was transported to the [facility]  in a hemodynamically stable, chest pain free condition.   The patient  was stable before, during and following the procedure.   Patient  did tolerate procedure well. There were not complications. EBL: < 10 ml   Medications:  Premedication: 5 mg  Valium,  Sedation:  2 mg IV Versed, 25 IV mcg Fentanyl  Contrast:  90 ml Omnipaque  Hemodynamics:  Central Aortic Pressure / Mean Aortic Pressure: 146/71 mmHg, 101 mmHg  LV Pressure / LV End diastolic Pressure:  147/11 mmHg, 16 mmHg  Left Ventriculography:  EF:  40-45%  Wall Motion: Global hypokinesis with mid to apical inferior hypokinesis  Coronary Angiographic Data:  Left Main:  Large caliber vessel, bifurcates into the LAD and Left Circumflex with a very small Ramus Intermedius.  Left Anterior Descending (LAD):  Large caliber vessel with 2 small caliber proximal Diagonal branches, the native vessel then tapers off with to-and-fro flow from the LIMA graft.  The distal LAD fills via the LIMA with minimal luminal irregularities.  The grafted Diagonal was not visualized.  Circumflex (LCx):  Large caliber vessel, 2 small OMBs (not numbered) then bifurcates into the main OM tunck and the AV Groove Circumflex.  1st obtuse marginal:  This is actually the small caliber superior branch of the OM trunk, angiographically normal.  2nd obtuse marginal:  Moderate to small caliber inferior branch of the OM Trunk - to & fro flow from the SVG.  posterior lateral branch:  The remainder of the AVGroove Circumflex courses around to give off a moderate caliber LPL.  Angiographyically normal.  Right Coronary Artery: Moderate sized dominant vessel, SA nodal and atrial branches followed by 2 RV marginal branches.  Bifurcates distally into RPDA and RPAV.  Minimal luminal irregularities  posterior descending artery: Small to moderate caliber vessel, reaches ~2/3 of the way to the apex. Minimal luminal irregularities.  Posterior-atrioventricular branch:  Small caliber vessel that gives off the AV Nodal artery as well as 4 small Posterolateral branches.  Grafts  LIMA - LAD: Widely  patent  SVG - DIAG: Unable to find Aorto-anastomosis with direct or indirect engagement  SVG - OM: Widely patent, antegrade flow fills large bifurcating OM, retrograde flow to the native Left Circumflex fills OM1 then down the native AV Groove Cx to the LPL branches.  Impression:  Cardiomyopathy - presumed to be Ischemic, but may be partially non-ischemic.  Widely patent LIMA & SVG-OM but unidentifiable SVG-Diag.  Normal RCA.  No culprit lesion besides the missing SVG-Diag  Plan:  Optimize Medical Therapy  PM discharge.  The case and results was discussed with the patient (and family). The case and results was discussed with the patient's Cardiologist.  Time Spend Directly with Patient:  45 minutes  Serria Sloma W, M.D., M.S. [facility] [address] [place], [place]  [address]  [phone]  10/13/2011 10:21 AM

## 2011-12-05 ENCOUNTER — Encounter: Payer: Self-pay | Admitting: Emergency Medicine

## 2011-12-05 ENCOUNTER — Ambulatory Visit (INDEPENDENT_AMBULATORY_CARE_PROVIDER_SITE_OTHER): Payer: Medicare Other | Admitting: Emergency Medicine

## 2011-12-05 VITALS — BP 115/70 | HR 64 | Ht 67.0 in | Wt 188.0 lb

## 2011-12-05 DIAGNOSIS — I5020 Unspecified systolic (congestive) heart failure: Secondary | ICD-10-CM

## 2011-12-05 DIAGNOSIS — G250 Essential tremor: Secondary | ICD-10-CM

## 2011-12-05 DIAGNOSIS — I10 Essential (primary) hypertension: Secondary | ICD-10-CM

## 2011-12-05 DIAGNOSIS — Z79899 Other long term (current) drug therapy: Secondary | ICD-10-CM

## 2011-12-05 DIAGNOSIS — G252 Other specified forms of tremor: Secondary | ICD-10-CM

## 2011-12-05 LAB — BASIC METABOLIC PANEL
BUN: 11 mg/dL (ref 6–23)
CO2: 27 mEq/L (ref 19–32)
Calcium: 9.3 mg/dL (ref 8.4–10.5)
Chloride: 106 mEq/L (ref 96–112)
Creat: 0.98 mg/dL (ref 0.50–1.35)
Glucose, Bld: 90 mg/dL (ref 70–99)
Potassium: 4.7 mEq/L (ref 3.5–5.3)
Sodium: 142 mEq/L (ref 135–145)

## 2011-12-05 LAB — VITAMIN B12: Vitamin B-12: 270 pg/mL (ref 211–911)

## 2011-12-05 LAB — POCT GLYCOSYLATED HEMOGLOBIN (HGB A1C): Hemoglobin A1C: 5.4

## 2011-12-05 NOTE — Assessment & Plan Note (Signed)
Has been seen by Dr. [name] of [facility] who requested labs (obtained today).  Will increase primidone to 375mg  BID.

## 2011-12-05 NOTE — Assessment & Plan Note (Signed)
Stable.  Tolerating lisinopril well.  On propranolol for essential tremor.

## 2011-12-05 NOTE — Patient Instructions (Signed)
It was nice to see you again!  We are going to increase your primidone to 1.5 tablets twice a day. We also collected some blood today.  I will send a letter with the results.  I will also send the results to Dr. [name] in Neurology.  Follow up in 6 months or sooner as needed.

## 2011-12-05 NOTE — Assessment & Plan Note (Signed)
Well controlled. Continue lisinopril and propranolol. 

## 2011-12-05 NOTE — Progress Notes (Signed)
  Subjective:    Patient ID: Brendan [name], male    DOB: 17-Mar-1941, 71 y.o.   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  Hypertension Well controlled: yes Compliant with medication: yes Side effects from medication: no Check BP at home: yes  BP ranges: 100-120s/60-70s  Chest pain: no Palpitations: no Vision changes: no Leg edema: no Dizziness: no  Systolic CHF Had worsening of chest pain in June and saw his cardiologist who sent him for cath.  This showed CHF with EF of 40-45% with patent grafts.  Started on lisinopril 10mg  daily.  Chest pain now resolved.  Essential tremor Cardiologist had referred him to neurology for evaluation of possible parkinsons.  He reports that neurology wants some labs, but thinks this is consistent with essential tremor.  Patient would like to increase primidone (we had discussed this at an earlier appointment, but he had opted to keep medication the same).  I have reviewed and updated the following as appropriate: allergies and current medications SHx: former smoker  Review of Systems See HPI    Objective:   Physical Exam BP 115/70  Pulse 64  Ht 5\' 7"  (1.702 m)  Wt 188 lb (85.276 kg)  BMI 29.44 kg/m2 Gen: alert, cooperative, NAD HEENT: AT/[place], sclera white, MMM CV: RRR, no murmurs Pulm: CTAB, no wheezes or rales Ext: trace edema in right ankle     Assessment & Plan:

## 2011-12-06 ENCOUNTER — Encounter: Payer: Self-pay | Admitting: Emergency Medicine

## 2011-12-06 LAB — PRIMIDONE AND METABOLITE LEVEL
Phenobarbital: 19.6 ug/mL (ref 15.0–40.0)
Primidone, Serum: 7 ug/mL (ref 5–12)

## 2012-01-02 ENCOUNTER — Ambulatory Visit (INDEPENDENT_AMBULATORY_CARE_PROVIDER_SITE_OTHER): Payer: Medicare Other | Admitting: *Deleted

## 2012-01-02 DIAGNOSIS — Z23 Encounter for immunization: Secondary | ICD-10-CM

## 2012-02-21 ENCOUNTER — Encounter: Payer: Self-pay | Admitting: Emergency Medicine

## 2012-02-21 ENCOUNTER — Other Ambulatory Visit: Payer: Self-pay | Admitting: Emergency Medicine

## 2012-02-21 ENCOUNTER — Ambulatory Visit (HOSPITAL_COMMUNITY)
Admission: RE | Admit: 2012-02-21 | Discharge: 2012-02-21 | Disposition: A | Discharge status: Home / Self Care | Payer: Medicare Other | Source: Ambulatory Visit | Attending: Family Medicine | Admitting: Family Medicine

## 2012-02-21 ENCOUNTER — Ambulatory Visit (INDEPENDENT_AMBULATORY_CARE_PROVIDER_SITE_OTHER): Payer: Medicare Other | Admitting: Emergency Medicine

## 2012-02-21 VITALS — BP 127/77 | HR 51 | Ht 67.0 in | Wt 186.0 lb

## 2012-02-21 DIAGNOSIS — M25539 Pain in unspecified wrist: Secondary | ICD-10-CM | POA: Insufficient documentation

## 2012-02-21 DIAGNOSIS — M79641 Pain in right hand: Secondary | ICD-10-CM

## 2012-02-21 DIAGNOSIS — M79609 Pain in unspecified limb: Secondary | ICD-10-CM

## 2012-02-21 NOTE — Progress Notes (Signed)
  Subjective:    Patient ID: Brendan [name], male    DOB: Aug 23, 1940, 71 y.o.   MRN: [id number]  HPI Brendan [name] is here for right hand pain.  He is left handed.  Reports the pain has been present for 4-5 weeks.  Maybe injured it when he was messing around with the lawn mower, but not sure.  Also reports that he has fallen a few times, but does not specifically remember falling on his hand.  Pain is located primarily at the base of the thumb, although he does say sometimes his fingers hurt as well.  Pain is worse with any sort of activity, even using the TV remote.  Initially had some pain that went up into the volar wrist, but this has resolved.  Does sometimes have some numbness in the hand, but none currently.  Hand is a little swollen.  Has not been using anything at home.   PMHx: essential tremor, hypertension, CAD  SHx: former smoker  Review of Systems See HPI    Objective:   Physical Exam BP 127/77  Pulse 51  Ht 5\' 7"  (1.702 m)  Wt 186 lb (84.369 kg)  BMI 29.13 kg/m2 Gen: alert, cooperative, NAD, tremor in arms and head Right hand/wrist: mild edema over dorsal hand compared to left, tender to palpation over anatomic snuffbox, full range of motion of thumb and fingers, wrist extension limited by pain in the radial aspect of the wrist, 5/5 thumb extension, opposition, abduction without pain, 5/5 grip and interosseus       Assessment & Plan:

## 2012-02-21 NOTE — Assessment & Plan Note (Signed)
Fracture vs tendonopathy.  Concern for scaphoid fracture given location of pain.  Sent for x-rays - these came back negative for fracture which means it is likely a tendonopathy.  No evidence for carpal tunnel on exam, exam not consistent with DeQuervain's either.  Suspect involvement of extensor carpi radialis.  Will refer to sports medicine for ultrasound and further treatment.

## 2012-02-21 NOTE — Patient Instructions (Addendum)
I'm sorry your hand is hurting. I am worried that you might have broken a bone in your hand. Please go over to the hospital to get x-rays of your hand. I will call you after reviewing the x-rays and let you know what the next step is. You can take ibuprofen or tylenol to help with the pain.

## 2012-03-04 ENCOUNTER — Encounter: Payer: Self-pay | Admitting: Sports Medicine

## 2012-03-04 ENCOUNTER — Ambulatory Visit (INDEPENDENT_AMBULATORY_CARE_PROVIDER_SITE_OTHER): Payer: Medicare Other | Admitting: Sports Medicine

## 2012-03-04 VITALS — BP 125/78 | HR 60 | Ht 67.0 in | Wt 186.0 lb

## 2012-03-04 DIAGNOSIS — M79609 Pain in unspecified limb: Secondary | ICD-10-CM

## 2012-03-04 DIAGNOSIS — M79643 Pain in unspecified hand: Secondary | ICD-10-CM

## 2012-03-04 LAB — COMPREHENSIVE METABOLIC PANEL
ALT: 14 U/L (ref 0–53)
AST: 14 U/L (ref 0–37)
Albumin: 4.7 g/dL (ref 3.5–5.2)
Alkaline Phosphatase: 89 U/L (ref 39–117)
BUN: 15 mg/dL (ref 6–23)
CO2: 25 mEq/L (ref 19–32)
Calcium: 9.5 mg/dL (ref 8.4–10.5)
Chloride: 105 mEq/L (ref 96–112)
Creat: 1.08 mg/dL (ref 0.50–1.35)
Glucose, Bld: 96 mg/dL (ref 70–99)
Potassium: 4.6 mEq/L (ref 3.5–5.3)
Sodium: 139 mEq/L (ref 135–145)
Total Bilirubin: 0.8 mg/dL (ref 0.3–1.2)
Total Protein: 7 g/dL (ref 6.0–8.3)

## 2012-03-04 LAB — URIC ACID: Uric Acid, Serum: 6.8 mg/dL (ref 4.0–7.8)

## 2012-03-04 LAB — CBC
HCT: 39.8 % (ref 39.0–52.0)
Hemoglobin: 14.3 g/dL (ref 13.0–17.0)
MCH: 33.7 pg (ref 26.0–34.0)
MCHC: 35.9 g/dL (ref 30.0–36.0)
MCV: 93.9 fL (ref 78.0–100.0)
Platelets: 164 10*3/uL (ref 150–400)
RBC: 4.24 MIL/uL (ref 4.22–5.81)
RDW: 13.6 % (ref 11.5–15.5)
WBC: 6.1 10*3/uL (ref 4.0–10.5)

## 2012-03-04 LAB — SEDIMENTATION RATE: Sed Rate: 4 mm/hr (ref 0–16)

## 2012-03-05 LAB — C-REACTIVE PROTEIN: CRP: <0.5 mg/dL (ref <0.60)

## 2012-03-05 LAB — ANA: Anti Nuclear Antibody(ANA): NEGATIVE

## 2012-03-05 LAB — RHEUMATOID FACTOR: Rhuematoid fact SerPl-aCnc: <10 IU/mL (ref <=14)

## 2012-03-05 MED ORDER — PREDNISONE 10 MG PO KIT: PACK | ORAL | Status: DC

## 2012-03-05 NOTE — Progress Notes (Signed)
  Subjective:    Patient ID: Brendan [name], male    DOB: [birth year], 71 y.o.   MRN: [id number]  HPI chief complaint: Right wrist pain  Very pleasant left-hand-dominant male comes in today complaining of 6-7 weeks of intermittent left wrist pain. He denies any trauma but rather describes an insidious onset of pain across the dorsum of his wrist. He recently saw his PCP who ordered x-rays of the wrist and hand. They are available for review. He was then referred to our office for further evaluation and treatment. He has pain with any type of wrist motion or gripping. Pain improves at rest. He has noticed some mild swelling. No fevers or chills. No history of rheumatoid arthritis or gout. No numbness or tingling. He denies any increase in activity with the right hand. He is here today with his wife.  Past medical history and medications are reviewed He's allergic to lisinopril    Review of Systems     Objective:   Physical Exam Well-developed, well-nourished. No acute distress. There is a resting tremor of his hands and head due to an essential tremor  Right hand and wrist: Mild swelling diffusely across the dorsum of the hand when compared to the uninvolved left hand. There is limited range of motion in all planes due to pain. No erythema. Skin is not warm to touch. There is diffuse tenderness to palpation across the dorsum of the wrist as well as along the first extensor compartment. Pain with Lourena Simmonds testing. No obvious wrist effusion. Good radial and ulnar pulses.  MSK ultrasound of the right wrist shows edema around the extensor tendons of the first through fourth compartments. No discrete tear. Fifth and sixth compartments do not show any abnormal edema.  X-rays of his right hand and wrist dated 02/21/2012 are reviewed. Nothing acute is seen. No significant degenerative changes. The films are unremarkable.       Assessment & Plan:  1. Diffuse extensor tendinitis right hand and  wrist  Etiology of this patient's symptoms is unclear. I'll place him on a 6 day Sterapred Dosepak to take as directed. Thumb spica brace to wear for comfort. Blood work to rule out systemic arthropathy such as gout, rheumatoid arthritis,etc. Patient will followup with me in one week.

## 2012-03-11 ENCOUNTER — Ambulatory Visit (INDEPENDENT_AMBULATORY_CARE_PROVIDER_SITE_OTHER): Payer: Medicare Other | Admitting: Sports Medicine

## 2012-03-11 VITALS — BP 122/60 | Ht 68.0 in | Wt 186.0 lb

## 2012-03-11 DIAGNOSIS — M79609 Pain in unspecified limb: Secondary | ICD-10-CM

## 2012-03-11 DIAGNOSIS — M25539 Pain in unspecified wrist: Secondary | ICD-10-CM

## 2012-03-11 DIAGNOSIS — M79641 Pain in right hand: Secondary | ICD-10-CM

## 2012-03-11 NOTE — Patient Instructions (Addendum)
MRI IS ON Monday December 2ND AT 545PM [name] [address] [phone]

## 2012-03-11 NOTE — Progress Notes (Signed)
  Subjective:    Patient ID: Brendan [name], male    DOB: December 04, 1940, 71 y.o.   MRN: [id number]  HPI Patient comes in today for followup on right wrist/hand pain and swelling. Swelling has improved but pain persists. Pain is diffuse across the dorsum of his wrist at times concentrating itself around the thumb. He is wearing his wrist brace for comfort and finds it to be somewhat beneficial. He has completed his 6 day Sterapred Dosepak. Blood work done last week including a sedimentation rate and C-reactive protein were unremarkable. He is here today with his wife.    Review of Systems     Objective:   Physical Exam Well-developed, well-nourished. He is not ill-appearing.  Right wrist: Previous soft tissue swelling has improved. He is still diffusely tender across the dorsum of his wrist, particularly over the wrist extensors including the first extensor compartment. Positive Finkelstein's. No joint effusion. Limited range of motion in all planes due to pain. No erythema. Skin is not warm to touch. Good radial and ulnar pulses.      Assessment & Plan:  1. Persistent right wrist pain and swelling of unknown etiology  Recent blood work is unremarkable in regards to an obvious rheumatologic problem. Ultrasound done last week showed nonspecific fluid around most of the extensor tendons in his right wrist and hand. His symptoms have been present now for several weeks. Plain x-rays showed nothing acute. I recommended an MRI scan to further evaluate for soft tissue pathology. I will call him and his wife with those results once available. Home phone number is [phone]. We will delineate further treatment based on his MRI. In the meantime he will continue with his wrist brace as needed for comfort.

## 2012-03-18 ENCOUNTER — Ambulatory Visit
Admission: RE | Admit: 2012-03-18 | Discharge: 2012-03-18 | Disposition: A | Discharge status: Home / Self Care | Payer: Medicare Other | Source: Ambulatory Visit | Attending: Sports Medicine | Admitting: Sports Medicine

## 2012-03-18 DIAGNOSIS — M79641 Pain in right hand: Secondary | ICD-10-CM

## 2012-03-19 DIAGNOSIS — D519 Vitamin B12 deficiency anemia, unspecified: Secondary | ICD-10-CM | POA: Insufficient documentation

## 2012-03-20 ENCOUNTER — Other Ambulatory Visit: Payer: Self-pay | Admitting: Neurology

## 2012-03-20 DIAGNOSIS — R413 Other amnesia: Secondary | ICD-10-CM

## 2012-03-20 DIAGNOSIS — G250 Essential tremor: Secondary | ICD-10-CM

## 2012-03-25 ENCOUNTER — Ambulatory Visit (INDEPENDENT_AMBULATORY_CARE_PROVIDER_SITE_OTHER): Payer: Medicare Other | Admitting: Sports Medicine

## 2012-03-25 ENCOUNTER — Ambulatory Visit
Admission: RE | Admit: 2012-03-25 | Discharge: 2012-03-25 | Disposition: A | Discharge status: Home / Self Care | Payer: Medicare Other | Source: Ambulatory Visit | Attending: Neurology | Admitting: Neurology

## 2012-03-25 VITALS — BP 118/75 | Ht 67.0 in | Wt 185.0 lb

## 2012-03-25 DIAGNOSIS — G90519 Complex regional pain syndrome I of unspecified upper limb: Secondary | ICD-10-CM

## 2012-03-25 DIAGNOSIS — G250 Essential tremor: Secondary | ICD-10-CM

## 2012-03-25 DIAGNOSIS — G252 Other specified forms of tremor: Secondary | ICD-10-CM

## 2012-03-25 DIAGNOSIS — R413 Other amnesia: Secondary | ICD-10-CM

## 2012-03-25 MED ORDER — GABAPENTIN 300 MG PO CAPS: ORAL_CAPSULE | ORAL | Status: DC

## 2012-03-25 MED ORDER — PREGABALIN 75 MG PO CAPS: 75.0000 mg | ORAL_CAPSULE | Freq: Two times a day (BID) | ORAL | Status: DC

## 2012-03-25 NOTE — Patient Instructions (Addendum)
DR Oneita Kras [phone] [address] AT 2PM ARRIVE AT 145PM FAXED NOTES TO DIANE AT [phone]

## 2012-03-26 NOTE — Progress Notes (Signed)
  Subjective:    Patient ID: Brendan [name], male    DOB: 1940/05/22, 71 y.o.   MRN: [id number]  HPI  Patient comes in today for followup at my request. An MRI of his right wrist shows findings consistent with probable complex regional pain syndrome. There is extensive edema throughout the subcutaneous tissue as well as subcortical edema in the carpal bones, distal radius, and distal ulna. He also has a small joint effusion. His symptoms have now been present for almost 3 months. Again, he is not November any trauma. He is accompanied today by both his wife and his daughter.    Review of Systems     Objective:   Physical Exam Well-developed, well-nourished. No acute distress. Essential tremor noted.  Right hand and wrist still shows significant diffuse edema. No erythema. Limited mobility both at the wrist and the fingers due to swelling and pain. He also has some mild atrophy of the forearm, likely from disuse. Good ulnar and radial pulses.       Assessment & Plan:  1. Right wrist pain and swelling with MRI evidence of probable complex regional pain syndrome 2. Essential tremor  All refer the patient to Dr. Oneita Kras. I think he may benefit from a peripheral nerve block. He will need aggressive physical therapy as well and I have asked that he discontinue his wrist brace. I think we should also start him on a neuroleptic agent. I've given his daughter a prescription for both Lyrica and gabapentin. If the Lyrica is affordable they will use this 75 mg twice a day. If it is expensive they will instead use the gabapentin 300 mg each bedtime for 4 nights then 600 mg each bedtime thereafter. We will contact the patient and his family was referral to Dr. Yolanda Bonine is complete.  He does have some atrophy of his right forearm which I think is from disuse. His daughter is asking about the possibility of an underlying neurological cause for his complex regional pain syndrome. I've asked that she  discuss this further with the patient's neurologist, Dr. [name]. In fact, the patient is undergoing an MRI of his brain later today ordered by Dr. [name].

## 2012-04-05 ENCOUNTER — Telehealth: Payer: Self-pay | Admitting: Sports Medicine

## 2012-04-05 ENCOUNTER — Encounter: Payer: Self-pay | Admitting: *Deleted

## 2012-04-05 DIAGNOSIS — G90519 Complex regional pain syndrome I of unspecified upper limb: Secondary | ICD-10-CM

## 2012-04-05 NOTE — Telephone Encounter (Signed)
I spoke with Dr. Yolanda Bonine on the phone today regarding Brendan [name]. Dr. Yolanda Bonine agrees that the patient has findings consistent with complex regional pain syndrome with one exception... that there is no known trauma. In his experience, he has seen 2 other cases similar to this. One patient had a Pancoast tumor and the other had a large cervical spine syrinx. He has recommended that I pursue an MRI of his cervical spine to rule out a syrinx and I'll also get a chest x-ray including a right apical lordotic view specifically to rule out a Pancoast tumor. He has also recommended that I refer Brendan [name] to hand and rehabilitation for physical therapy. The patient has gotten good pain relief with the Lyrica which is encouraging. Dr. Yolanda Bonine also discussed nerve blocks with the patient and at the time of this dictation the patient and his family were considering that treatment.

## 2012-04-11 ENCOUNTER — Other Ambulatory Visit: Payer: Self-pay | Admitting: *Deleted

## 2012-04-11 ENCOUNTER — Ambulatory Visit
Admission: RE | Admit: 2012-04-11 | Discharge: 2012-04-11 | Disposition: A | Discharge status: Home / Self Care | Payer: Medicare Other | Source: Ambulatory Visit | Attending: Sports Medicine | Admitting: Sports Medicine

## 2012-04-11 ENCOUNTER — Other Ambulatory Visit: Payer: Medicare Other

## 2012-04-11 ENCOUNTER — Telehealth: Payer: Self-pay | Admitting: Sports Medicine

## 2012-04-11 ENCOUNTER — Other Ambulatory Visit: Payer: Self-pay | Admitting: Sports Medicine

## 2012-04-11 DIAGNOSIS — G90519 Complex regional pain syndrome I of unspecified upper limb: Secondary | ICD-10-CM

## 2012-04-11 DIAGNOSIS — R222 Localized swelling, mass and lump, trunk: Secondary | ICD-10-CM

## 2012-04-11 NOTE — Telephone Encounter (Signed)
I spoke with Brendan [name] his wife today on the phone after reviewing the MRI of his cervical spine and his chest X. MRI of the cervical spine does not reveal any evidence of a syrinx. Chest x-ray however does show a possible pleural-based mass in the upper right hemithoracic region. I'm going to order a CT scan with contrast to further evaluate this. Phone followup with the patient and his wife after this study. I will also contact Dr. Yolanda Bonine with an update. It sounds like [name] has already started his nerve blocks for his complex regional pain syndrome and I see no reason why he cannot continue with this while we continue with his workup.

## 2012-04-15 ENCOUNTER — Encounter (HOSPITAL_COMMUNITY): Payer: Self-pay

## 2012-04-15 ENCOUNTER — Ambulatory Visit (HOSPITAL_COMMUNITY)
Admission: RE | Admit: 2012-04-15 | Discharge: 2012-04-15 | Disposition: A | Discharge status: Home / Self Care | Payer: Medicare Other | Source: Ambulatory Visit | Attending: Sports Medicine | Admitting: Sports Medicine

## 2012-04-15 DIAGNOSIS — R222 Localized swelling, mass and lump, trunk: Secondary | ICD-10-CM

## 2012-04-15 LAB — BUN: BUN: 15 mg/dL (ref 6–23)

## 2012-04-15 MED ORDER — IOHEXOL 300 MG/ML  SOLN
75.0000 mL | Freq: Once | INTRAMUSCULAR | Status: AC | PRN
  Administered 2012-04-15: 75 mL via INTRAVENOUS

## 2012-04-16 ENCOUNTER — Other Ambulatory Visit (HOSPITAL_COMMUNITY): Payer: Medicare Other

## 2012-04-24 ENCOUNTER — Encounter: Payer: Self-pay | Admitting: Emergency Medicine

## 2012-04-24 ENCOUNTER — Ambulatory Visit (INDEPENDENT_AMBULATORY_CARE_PROVIDER_SITE_OTHER): Payer: Medicare Other | Admitting: Emergency Medicine

## 2012-04-24 VITALS — BP 106/69 | HR 55 | Ht 65.0 in | Wt 192.0 lb

## 2012-04-24 DIAGNOSIS — G250 Essential tremor: Secondary | ICD-10-CM

## 2012-04-24 DIAGNOSIS — I10 Essential (primary) hypertension: Secondary | ICD-10-CM

## 2012-04-24 DIAGNOSIS — M79641 Pain in right hand: Secondary | ICD-10-CM

## 2012-04-24 DIAGNOSIS — E782 Mixed hyperlipidemia: Secondary | ICD-10-CM

## 2012-04-24 DIAGNOSIS — M79609 Pain in unspecified limb: Secondary | ICD-10-CM

## 2012-04-24 DIAGNOSIS — G252 Other specified forms of tremor: Secondary | ICD-10-CM

## 2012-04-24 MED ORDER — PROPRANOLOL HCL 80 MG PO TABS: 80.0000 mg | ORAL_TABLET | Freq: Two times a day (BID) | ORAL | Status: DC

## 2012-04-24 MED ORDER — SIMVASTATIN 80 MG PO TABS: 80.0000 mg | ORAL_TABLET | Freq: Every day | ORAL | Status: DC

## 2012-04-24 MED ORDER — PRIMIDONE 250 MG PO TABS: 375.0000 mg | ORAL_TABLET | Freq: Two times a day (BID) | ORAL | Status: DC

## 2012-04-24 NOTE — Assessment & Plan Note (Signed)
Stable.  Continue primidone 375mg  BID and propranolol.

## 2012-04-24 NOTE — Progress Notes (Signed)
  Subjective:    Patient ID: Brendan [name], male    DOB: [birth year], 72 y.o.   MRN: [id number]  HPI Brendan [name] is here for f/u.  Hypertension Well controlled: yes Compliant with medication: yes Side effects from medication: no Check BP at home: no  Chest pain: no Palpitations: no Vision changes: no Leg edema: no Dizziness: no  Right wrist pain He is being followed at [facility] by Dr. Margaretha Sheffield.  Diagnosed with CRPS from unclear etiology.  MRI c-spine was normal.  CXR concerning for pleural plaques.  Chest CT showed pleural plaques consistent with asbestos exposure.  He has been referred to a pulmonologist.  Also seeing Dr. Yolanda Bonine for peripheral nerve blocks which he states are helping.  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    I have reviewed and updated the following as appropriate: allergies and current medications SHx: former smoker  Review of Systems See HPI    Objective:   Physical Exam BP 106/69  Pulse 55  Ht 5\' 5"  (1.651 m)  Wt 192 lb (87.091 kg)  BMI 31.95 kg/m2 Gen: alert, cooperative, NAD HEENT: AT/[place], sclera white, MMM Neck: supple, no LAD CV: RRR, no murmurs Pulm: CTAB, no wheezes or rales Ext: no edema, 2+ DP pulses bilaterally Right wrist: hand is diffusely swollen with out erythema, pain with any wrist movement      Assessment & Plan:

## 2012-04-24 NOTE — Assessment & Plan Note (Signed)
Well controlled. Continue current medications  

## 2012-04-24 NOTE — Patient Instructions (Addendum)
It was nice to see you! I'm glad your hand is getting better. I am not going to change anything today. I sent in the refills to [facility] (with increased dose of primidone). Let me know if there are any problems. Follow up with me in 3 months.

## 2012-04-24 NOTE — Assessment & Plan Note (Signed)
Well controlled.  Due for labs in 07/2012.

## 2012-04-24 NOTE — Assessment & Plan Note (Addendum)
CRPS.  Greatly appreciate Dr. Gilmer Mor assistance in managing this as well all the specialists.

## 2012-05-15 ENCOUNTER — Ambulatory Visit (INDEPENDENT_AMBULATORY_CARE_PROVIDER_SITE_OTHER): Payer: Medicare Other | Admitting: Critical Care Medicine

## 2012-05-15 ENCOUNTER — Encounter: Payer: Self-pay | Admitting: Critical Care Medicine

## 2012-05-15 VITALS — BP 120/72 | HR 58 | Temp 97.4°F | Ht 67.0 in | Wt 198.0 lb

## 2012-05-15 DIAGNOSIS — R0602 Shortness of breath: Secondary | ICD-10-CM

## 2012-05-15 DIAGNOSIS — T65891A Toxic effect of other specified substances, accidental (unintentional), initial encounter: Secondary | ICD-10-CM

## 2012-05-15 DIAGNOSIS — J61 Pneumoconiosis due to asbestos and other mineral fibers: Secondary | ICD-10-CM | POA: Insufficient documentation

## 2012-05-15 DIAGNOSIS — J920 Pleural plaque with presence of asbestos: Secondary | ICD-10-CM

## 2012-05-15 DIAGNOSIS — R091 Pleurisy: Secondary | ICD-10-CM

## 2012-05-15 LAB — PULMONARY FUNCTION TEST

## 2012-05-15 NOTE — Progress Notes (Signed)
Subjective:    Patient ID: Brendan [name], male    DOB: April 17, 1941, 72 y.o.   MRN: [id number]  HPI 72 y.o. M This patient is referred for an abnormal CT scan of the chest. Patient was seen by sports medicine for hand swelling. Workup of this included a chest x-ray and subsequent to this a CT scan of the chest. A CT scan of the chest shows pleural plaquing with calcifications. The patient does give a history of asbestos exposure working in [employer] in the 1960s. The patient has not had significant asbestos exposure since the 1960s is noted. The patient denies any current pulmonary complaints. There is no dyspnea, cough, chest pain, fever chills or sweats. There is no change in weight. There is no excess mucus. The patient does have a history of coronary artery disease but no prior history of significant pulmonary disease.  Past Medical History  Diagnosis Date  . Angina pectoris syndrome   . Tremor, essential     On Primidone  . Hypertension   . Hyperlipidemia   . Glaucoma   . Coronary atherosclerosis of artery bypass graft   . Hiatal hernia     GI: Dr Evette Cristal  . Hx of cardiac cath 2002    LAD disease, EF 60%.  Cardiologist is Dr Clarene Duke.     Family History  Problem Relation Age of Onset  . Heart disease Mother      History   Social History  . Marital Status: Married    Spouse Name: N/A    Number of Children: N/A  . Years of Education: N/A   Occupational History  . Not on file.   Social History Main Topics  . Smoking status: Former Smoker -- 3.0 packs/day for 20 years    Types: Cigarettes    Quit date: 04/17/1968  . Smokeless tobacco: Never Used  . Alcohol Use: Yes     Comment: Occasional 2 mixed drinks or 3 beers.  . Drug Use: No  . Sexually Active: Not on file   Other Topics Concern  . Not on file   Social History Narrative   Married.  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     Allergies  Allergen Reactions  . Lisinopril    REACTION: COUGH     Outpatient Prescriptions Prior to Visit  Medication Sig Dispense Refill  . aspirin (LONGS ADULT LOW STRENGTH ASA) 81 MG EC tablet Take 81 mg by mouth daily.        [name] lisinopril (PRINIVIL,ZESTRIL) 10 MG tablet Take 10 mg by mouth daily.      . nitroGLYCERIN (NITROSTAT) 0.4 MG SL tablet Place 1 tablet (0.4 mg total) under the tongue every 5 (five) minutes as needed for chest pain. call 911 if chest pain not better  30 tablet  3  . Omega-3 Fatty Acids (FISH OIL PO) Take by mouth daily.        [name] omeprazole (PRILOSEC OTC) 20 MG tablet Take 1 tablet (20 mg total) by mouth daily.  30 tablet  5  . pregabalin (LYRICA) 75 MG capsule Take 1 capsule (75 mg total) by mouth 2 (two) times daily.  60 capsule  0  . primidone (MYSOLINE) 250 MG tablet Take 1.5 tablets (375 mg total) by mouth 2 (two) times daily.  270 tablet  4  . propranolol (INDERAL) 80 MG tablet Take 1 tablet (80 mg total) by mouth 2 (two) times daily.  180 tablet  4  . simvastatin (  ZOCOR) 80 MG tablet Take 1 tablet (80 mg total) by mouth at bedtime.  90 tablet  4  . Tamsulosin HCl (FLOMAX) 0.4 MG CAPS       . gabapentin (NEURONTIN) 300 MG capsule One po qhs for 4 days then 2 po qhs  90 capsule  0  . LORazepam (ATIVAN) 0.5 MG tablet Take 1 tablet (0.5 mg total) by mouth daily as needed. For anxiety  30 tablet  5  . [DISCONTINUED] PredniSONE 10 MG KIT Take AD for a 6 day duration  21 each  0   Facility-Administered Medications Prior to Visit  Medication Dose Route Frequency Provider Last Rate Last Dose  . pneumococcal 23 valent vaccine (PNU-IMMUNE) injection 0.5 mL  0.5 mL Intramuscular Once Phebe Colla, MD       Last reviewed on 05/15/2012 11:09 AM by Storm Frisk, MD   Review of Systems  Constitutional: Negative for fever, chills, diaphoresis, activity change, appetite change, fatigue and unexpected weight change.  HENT: Negative for hearing loss, ear pain, nosebleeds, congestion, sore throat, facial swelling,  rhinorrhea, sneezing, mouth sores, trouble swallowing, neck pain, neck stiffness, dental problem, voice change, postnasal drip, sinus pressure, tinnitus and ear discharge.   Eyes: Negative for photophobia, discharge, itching and visual disturbance.  Respiratory: Positive for shortness of breath. Negative for apnea, cough, choking, chest tightness, wheezing and stridor.   Cardiovascular: Negative for chest pain, palpitations and leg swelling.  Gastrointestinal: Negative for nausea, vomiting, abdominal pain, constipation, blood in stool and abdominal distention.  Genitourinary: Negative for dysuria, urgency, frequency, hematuria, flank pain, decreased urine volume and difficulty urinating.  Musculoskeletal: Positive for myalgias, joint swelling and arthralgias. Negative for back pain and gait problem.  Skin: Negative for color change, pallor and rash.  Neurological: Negative for dizziness, tremors, seizures, syncope, speech difficulty, weakness, light-headedness, numbness and headaches.  Hematological: Negative for adenopathy. Does not bruise/bleed easily.  Psychiatric/Behavioral: Negative for confusion, sleep disturbance and agitation. The patient is not nervous/anxious.        Objective:   Physical Exam Filed Vitals:   05/15/12 1008  BP: 120/72  Pulse: 58  Temp: 97.4 F (36.3 C)  TempSrc: Oral  Height: 5\' 7"  (1.702 m)  Weight: 198 lb (89.812 kg)  SpO2: 97%    Gen: Pleasant, well-nourished, in no distress,  normal affect  ENT: No lesions,  mouth clear,  oropharynx clear, no postnasal drip  Neck: No JVD, no TMG, no carotid bruits  Lungs: No use of accessory muscles, no dullness to percussion, clear without rales or rhonchi  Cardiovascular: RRR, heart sounds normal, no murmur or gallops, no peripheral edema  Abdomen: soft and NT, no HSM,  BS normal  Musculoskeletal: No deformities, no cyanosis or clubbing  Neuro: alert, non focal  Skin: Warm, no lesions or rashes  CT scan  of the chest is reviewed and reveals bilateral pleural plaquing with calcification. No evidence of asbestosis. No evidence of interstitial scar or reticular nodular infiltrate. No evidence of mesothelioma seen.  Pulmonary functions obtained on 05/15/2012 reveal: FEV1 61% addicted FVC 63%  FEV1 to FVC ratio 66% predicted     predicted FEF 25 75  32% predicted total lung capacity 65% predicted diffusion capacity 75% predicted     Assessment & Plan:   Pleural plaque with presence of asbestos Asbestos related pleural disease with pleural plaquing and calcifications. Significant asbestos exposure over 40 years ago in the workplace. No evidence of mesothelioma on current CT scan. The patient  has relatively minimal pulmonary symptoms at this time. Pulmonary functions show moderate restriction. Plan Expectant observation Return one year with repeat CT scan to monitor for development of mesothelioma    Updated Medication List Outpatient Encounter Prescriptions as of 05/15/2012  Medication Sig Dispense Refill  . aspirin (LONGS ADULT LOW STRENGTH ASA) 81 MG EC tablet Take 81 mg by mouth daily.        [name] lisinopril (PRINIVIL,ZESTRIL) 10 MG tablet Take 10 mg by mouth daily.      . nitroGLYCERIN (NITROSTAT) 0.4 MG SL tablet Place 1 tablet (0.4 mg total) under the tongue every 5 (five) minutes as needed for chest pain. call 911 if chest pain not better  30 tablet  3  . Omega-3 Fatty Acids (FISH OIL PO) Take by mouth daily.        [name] omeprazole (PRILOSEC OTC) 20 MG tablet Take 1 tablet (20 mg total) by mouth daily.  30 tablet  5  . pregabalin (LYRICA) 75 MG capsule Take 1 capsule (75 mg total) by mouth 2 (two) times daily.  60 capsule  0  . primidone (MYSOLINE) 250 MG tablet Take 1.5 tablets (375 mg total) by mouth 2 (two) times daily.  270 tablet  4  . propranolol (INDERAL) 80 MG tablet Take 1 tablet (80 mg total) by mouth 2 (two) times daily.  180 tablet  4  . simvastatin (ZOCOR) 80 MG tablet Take 1 tablet  (80 mg total) by mouth at bedtime.  90 tablet  4  . Tamsulosin HCl (FLOMAX) 0.4 MG CAPS       . gabapentin (NEURONTIN) 300 MG capsule One po qhs for 4 days then 2 po qhs  90 capsule  0  . LORazepam (ATIVAN) 0.5 MG tablet Take 1 tablet (0.5 mg total) by mouth daily as needed. For anxiety  30 tablet  5  . traMADol (ULTRAM) 50 MG tablet Take 1 tablet by mouth Three times a day.      . [DISCONTINUED] PredniSONE 10 MG KIT Take AD for a 6 day duration  21 each  0   Facility-Administered Encounter Medications as of 05/15/2012  Medication Dose Route Frequency Provider Last Rate Last Dose  . pneumococcal 23 valent vaccine (PNU-IMMUNE) injection 0.5 mL  0.5 mL Intramuscular Once Phebe Colla, MD

## 2012-05-15 NOTE — Assessment & Plan Note (Signed)
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 Significant asbestos exposure over 40 years ago in the workplace. No evidence of mesothelioma on current CT scan. The patient has relatively minimal pulmonary symptoms at this time. Pulmonary functions show moderate restriction. Plan Expectant observation Return one year with repeat CT scan to monitor for development of mesothelioma

## 2012-05-15 NOTE — Progress Notes (Signed)
PFT done today. 

## 2012-05-15 NOTE — Patient Instructions (Addendum)
You have asbestos related lung plaques/scar in the lining of your lung You have mild restriction in your breathing from this No change in medications Return 1 year with repeat CT of chest, we will remind you of this as the date approaches with a phone call

## 2012-05-16 ENCOUNTER — Encounter: Payer: Self-pay | Admitting: Critical Care Medicine

## 2012-05-23 ENCOUNTER — Encounter: Payer: Self-pay | Admitting: Sports Medicine

## 2012-05-23 ENCOUNTER — Ambulatory Visit (INDEPENDENT_AMBULATORY_CARE_PROVIDER_SITE_OTHER): Payer: Medicare Other | Admitting: Sports Medicine

## 2012-05-23 VITALS — BP 119/73 | Ht 67.0 in | Wt 198.0 lb

## 2012-05-23 DIAGNOSIS — G589 Mononeuropathy, unspecified: Secondary | ICD-10-CM

## 2012-05-23 DIAGNOSIS — IMO0002 Reserved for concepts with insufficient information to code with codable children: Secondary | ICD-10-CM

## 2012-05-24 DIAGNOSIS — G9050 Complex regional pain syndrome I, unspecified: Secondary | ICD-10-CM | POA: Insufficient documentation

## 2012-05-24 NOTE — Progress Notes (Signed)
  Subjective:    Patient ID: Brendan [name], male    DOB: 1940-04-28, 72 y.o.   MRN: [id number]  HPI Bo Merino comes in today for followup. He has been seeing Dr. Yolanda Bonine for treatment of his right hand complex regional pain syndrome. He is making slow but steady progress. He has had a total of 6 stellate ganglion blocks and has been actively pursuing physical therapy. He is on 100mg  of Lyrica three times daily and taking Ultram PRN for breakthrough pain. A recent CT scan of his chest showed an abnormality for which he recently saw Dr. Delford Field. He has a small pleural plaque secondary to asbestosis. No evidence of mesothelioma. Dr. Lynelle Doctor recommendation is for a followup CT scan in one year. He is here today with his wife.    Review of Systems     Objective:   Physical Exam Well-developed, well-nourished. No acute distress.  Right hand: Mild edema diffusely. He is able to approximate his right fingertips to the proximal palmar crease. Minimal tenderness to palpation. No erythema. Good radial and ulnar pulses.      Assessment & Plan:  1. Improving right hand pain and swelling secondary to complex regional pain syndrome. 2. Pleural plaque with presence of asbestosis  His complex regional pain syndrome in his right hand is slowly but surely improving. I explained to both Liandro and his wife that this will take several months and that they should continue with treatment with Dr. Yolanda Bonine. He understands the importance of continuing with physical therapy as well. Followup with Dr. Delford Field per his discretion for his pleural plaque. I will remain available to see him when necessary.

## 2012-05-27 ENCOUNTER — Other Ambulatory Visit: Payer: Medicare Other

## 2012-05-27 DIAGNOSIS — I5020 Unspecified systolic (congestive) heart failure: Secondary | ICD-10-CM

## 2012-05-27 LAB — IRON AND TIBC
%SAT: 40 % (ref 20–55)
Iron: 139 ug/dL (ref 42–165)
TIBC: 351 ug/dL (ref 215–435)
UIBC: 212 ug/dL (ref 125–400)

## 2012-05-27 LAB — CBC
HCT: 40.8 % (ref 39.0–52.0)
Hemoglobin: 13.6 g/dL (ref 13.0–17.0)
MCH: 32.4 pg (ref 26.0–34.0)
MCHC: 33.3 g/dL (ref 30.0–36.0)
MCV: 97.1 fL (ref 78.0–100.0)
Platelets: 141 10*3/uL — ABNORMAL LOW (ref 150–400)
RBC: 4.2 MIL/uL — ABNORMAL LOW (ref 4.22–5.81)
RDW: 14 % (ref 11.5–15.5)
WBC: 5.1 10*3/uL (ref 4.0–10.5)

## 2012-05-27 LAB — FERRITIN: Ferritin: 47 ng/mL (ref 22–322)

## 2012-05-27 NOTE — Progress Notes (Signed)
CBC,IRON,TIBC DONE TODAY Brendan [name]

## 2012-06-05 ENCOUNTER — Encounter: Payer: Self-pay | Admitting: Critical Care Medicine

## 2012-06-19 ENCOUNTER — Ambulatory Visit (INDEPENDENT_AMBULATORY_CARE_PROVIDER_SITE_OTHER): Payer: Medicare Other | Admitting: Sports Medicine

## 2012-06-19 VITALS — BP 120/70 | Ht 67.0 in | Wt 198.0 lb

## 2012-06-19 DIAGNOSIS — G589 Mononeuropathy, unspecified: Secondary | ICD-10-CM

## 2012-06-19 DIAGNOSIS — IMO0002 Reserved for concepts with insufficient information to code with codable children: Secondary | ICD-10-CM

## 2012-06-19 MED ORDER — TRAMADOL HCL 50 MG PO TABS: 50.0000 mg | ORAL_TABLET | Freq: Three times a day (TID) | ORAL | Status: DC | PRN

## 2012-06-19 NOTE — Progress Notes (Signed)
  Subjective:    Patient ID: Brendan [name], male    DOB: [birth year], 72 y.o.   MRN: [id number]  HPI Patient comes in today for followup on his hand pain. Please see previous notes for detailed history to date. He is still being treated by Dr. Yolanda Bonine for complex regional pain syndrome of the right hand. He has undergone several nerve blocks and is on Lyrica. He was initially on 300 mg of Lyrica a day but his wife noticed him having some increased dizziness and short-term memory problems. Dr. Yolanda Bonine asked that they decreased the dose to 200 mg a day but she has not noticed that making much of a difference. In fact, she has resumed the 300 mg dose. He made some initial improvement with the nerve blocks and with physical therapy but his symptoms have plateaued. He continues to work in physical therapy. He has a compression glove for the edema. He was previously given a prescription for Ultram for pain which was somewhat helpful but he is almost out of this. He is here today with his wife. They're concerned that he is not making much progress.    Review of Systems     Objective:   Physical Exam Well-developed, well-nourished. No acute distress.  Right hand: Mild edema diffusely in the dorsum of the right hand. No erythema. He has limited range of motion in all of his fingers due to swelling and pain. Skin is normal color. Warm to touch. Good ulnar and radial pulses.       Assessment & Plan:  1. Right hand pain and swelling secondary to complex regional pain syndrome  Patient has a followup appointment with Dr. Yolanda Bonine on Monday. I would like to discuss his case with Dr. Yolanda Bonine personally. I'm going to go ahead and prescribe Ultram 50 mg 3 times daily to be taken with his Lyrica. I think it is important for him to continue with physical therapy. I will see him back in the office in 3 weeks. Unfortunately, this is a very difficult condition to treat. Obviously, I think the patient should  continue to follow Dr. Cherlyn Labella instructions.

## 2012-06-25 ENCOUNTER — Telehealth: Payer: Self-pay | Admitting: Sports Medicine

## 2012-06-25 ENCOUNTER — Telehealth: Payer: Self-pay | Admitting: Emergency Medicine

## 2012-06-25 NOTE — Telephone Encounter (Signed)
I spoke with both Malakie's wife and Fenris his daughter on the phone yesterday. Apparently Dreyton has been experiencing some increasing confusion. They also tell me that he has increased edema in his legs and his gait is becoming more and more unsteady. I instructed Wesam's wife to make a followup appointment with their primary care physician Dr. Elwyn Reach. That appointment is scheduled for this Thursday, March 13th. There was some confusion at his visit last week as to whether or not Treyshawn was taking 200 mg or 300 mg of Lyrica. His wife tells me that he is in fact on 200 mg of Lyrica. I've asked that he decrease his dose to 100 mg as Lyrica has been known to cause side effects similar to what Boy is experiencing. Although the pain in his right hand from his complex regional pain syndrome may increase as he decreases his Lyrica I think it is important to consider this drug as a potential cause of his neurological decline. Adryan may need to followup with his neurologist after seeing Dr. Elwyn Reach. I also expressed to Belinda's wife the importance of seeking attention in the emergency department if Hashir's symptoms continued to deteriorate or acutely worsen. She understands.

## 2012-06-25 NOTE — Telephone Encounter (Signed)
Agree should be seen at ED.

## 2012-06-25 NOTE — Telephone Encounter (Signed)
Spoke with daughter [name] .  An appointment has been scheduled for patient tomorrow with Dr. Gwendolyn Grant.   She reports patient has had a recent fall , the evening of 03/09.  Was found by a neighbor lying in an out building . Thinks he had gone out to check on generator ( power was off. ). After some difficulty neighbor and wife were able to get patient up.  Since then mental status seems altered, drooling  more, gait is unsteady, retaining fluids, poor appetite.    Family thought patient was already scheduled with Dr. Elwyn Reach for this week but was not. [name] Appointment has been made with Dr. Gwendolyn Grant for tomorrow.   Advised daughter that patient should go to ED today for evaluation and daughter states she knows that, but  patient will not go and even  if he knew appointment tomorrow was not with Dr. Elwyn Reach he would refuse to come tomorrow. Daughter plans on coming to appointment tomorrow  but wants to speak with Dr. Gwendolyn Grant prior to visit in private to give him some information. Advised I will give this information to Dr. Gwendolyn Grant but again encouraged her to have patient go to ED .

## 2012-06-25 NOTE — Telephone Encounter (Signed)
Daughter is calling because she is concerned about symptoms her dad is having.  Burgess Estelle, is wife called because Dr. Margaretha Sheffield said he needed to see his doctor right away due to a recent fall.  They were insistent about seeing Dr. Elwyn Reach, which was scheduled for Thursday because Dr. Elwyn Reach isn't here tomorrow, but the daughter thinks he needs to be seen sooner.  So, he has been scheduled with Dr. Gwendolyn Grant for tomorrow morning.  She is going to drive up from [facility] so that she can come to the appt tomorrow.  She would like to speak to the nurse though about his symptoms because she thinks that he is going to need a direct admit.  She is a Engineer, civil (consulting) and is concerned that his symptoms may be from a build up of uric acid.

## 2012-06-26 ENCOUNTER — Observation Stay (HOSPITAL_COMMUNITY): Payer: Medicare Other

## 2012-06-26 ENCOUNTER — Encounter (HOSPITAL_COMMUNITY): Payer: Self-pay | Admitting: General Practice

## 2012-06-26 ENCOUNTER — Observation Stay (HOSPITAL_COMMUNITY)
Admission: AD | Admit: 2012-06-26 | Discharge: 2012-06-27 | Disposition: A | Discharge status: Home / Self Care | Payer: Medicare Other | Source: Ambulatory Visit | Attending: Family Medicine | Admitting: Family Medicine

## 2012-06-26 ENCOUNTER — Ambulatory Visit (INDEPENDENT_AMBULATORY_CARE_PROVIDER_SITE_OTHER): Payer: Medicare Other | Admitting: Family Medicine

## 2012-06-26 ENCOUNTER — Encounter: Payer: Self-pay | Admitting: Family Medicine

## 2012-06-26 VITALS — BP 115/71 | HR 54 | Temp 98.0°F | Ht 65.0 in | Wt 192.1 lb

## 2012-06-26 DIAGNOSIS — I25810 Atherosclerosis of coronary artery bypass graft(s) without angina pectoris: Secondary | ICD-10-CM | POA: Diagnosis present

## 2012-06-26 DIAGNOSIS — F411 Generalized anxiety disorder: Secondary | ICD-10-CM | POA: Insufficient documentation

## 2012-06-26 DIAGNOSIS — Z951 Presence of aortocoronary bypass graft: Secondary | ICD-10-CM | POA: Insufficient documentation

## 2012-06-26 DIAGNOSIS — M79641 Pain in right hand: Secondary | ICD-10-CM

## 2012-06-26 DIAGNOSIS — F419 Anxiety disorder, unspecified: Secondary | ICD-10-CM | POA: Diagnosis present

## 2012-06-26 DIAGNOSIS — E782 Mixed hyperlipidemia: Secondary | ICD-10-CM | POA: Diagnosis present

## 2012-06-26 DIAGNOSIS — M79609 Pain in unspecified limb: Secondary | ICD-10-CM

## 2012-06-26 DIAGNOSIS — F0390 Unspecified dementia without behavioral disturbance: Secondary | ICD-10-CM

## 2012-06-26 DIAGNOSIS — I10 Essential (primary) hypertension: Secondary | ICD-10-CM | POA: Diagnosis present

## 2012-06-26 DIAGNOSIS — N400 Enlarged prostate without lower urinary tract symptoms: Secondary | ICD-10-CM | POA: Diagnosis present

## 2012-06-26 DIAGNOSIS — G9050 Complex regional pain syndrome I, unspecified: Secondary | ICD-10-CM | POA: Diagnosis present

## 2012-06-26 DIAGNOSIS — G589 Mononeuropathy, unspecified: Secondary | ICD-10-CM | POA: Insufficient documentation

## 2012-06-26 DIAGNOSIS — S6990XA Unspecified injury of unspecified wrist, hand and finger(s), initial encounter: Secondary | ICD-10-CM | POA: Insufficient documentation

## 2012-06-26 DIAGNOSIS — Y92009 Unspecified place in unspecified non-institutional (private) residence as the place of occurrence of the external cause: Secondary | ICD-10-CM | POA: Insufficient documentation

## 2012-06-26 DIAGNOSIS — G252 Other specified forms of tremor: Secondary | ICD-10-CM | POA: Insufficient documentation

## 2012-06-26 DIAGNOSIS — I5020 Unspecified systolic (congestive) heart failure: Secondary | ICD-10-CM | POA: Diagnosis present

## 2012-06-26 DIAGNOSIS — G250 Essential tremor: Secondary | ICD-10-CM | POA: Insufficient documentation

## 2012-06-26 DIAGNOSIS — K219 Gastro-esophageal reflux disease without esophagitis: Secondary | ICD-10-CM | POA: Diagnosis present

## 2012-06-26 DIAGNOSIS — R3129 Other microscopic hematuria: Secondary | ICD-10-CM

## 2012-06-26 DIAGNOSIS — R269 Unspecified abnormalities of gait and mobility: Principal | ICD-10-CM | POA: Diagnosis present

## 2012-06-26 DIAGNOSIS — I2510 Atherosclerotic heart disease of native coronary artery without angina pectoris: Secondary | ICD-10-CM | POA: Insufficient documentation

## 2012-06-26 DIAGNOSIS — R32 Unspecified urinary incontinence: Secondary | ICD-10-CM

## 2012-06-26 DIAGNOSIS — W19XXXA Unspecified fall, initial encounter: Secondary | ICD-10-CM | POA: Insufficient documentation

## 2012-06-26 DIAGNOSIS — R251 Tremor, unspecified: Secondary | ICD-10-CM

## 2012-06-26 DIAGNOSIS — I5022 Chronic systolic (congestive) heart failure: Secondary | ICD-10-CM | POA: Insufficient documentation

## 2012-06-26 DIAGNOSIS — IMO0002 Reserved for concepts with insufficient information to code with codable children: Secondary | ICD-10-CM

## 2012-06-26 DIAGNOSIS — I509 Heart failure, unspecified: Secondary | ICD-10-CM | POA: Insufficient documentation

## 2012-06-26 LAB — URINALYSIS, ROUTINE W REFLEX MICROSCOPIC
Bilirubin Urine: NEGATIVE
Glucose, UA: NEGATIVE mg/dL
Ketones, ur: 15 mg/dL — AB
Leukocytes, UA: NEGATIVE
Nitrite: NEGATIVE
Protein, ur: NEGATIVE mg/dL
Specific Gravity, Urine: 1.011 (ref 1.005–1.030)
Urobilinogen, UA: 1 mg/dL (ref 0.0–1.0)
pH: 5.5 (ref 5.0–8.0)

## 2012-06-26 LAB — COMPREHENSIVE METABOLIC PANEL
ALT: 12 U/L (ref 0–53)
AST: 18 U/L (ref 0–37)
Albumin: 4.1 g/dL (ref 3.5–5.2)
Alkaline Phosphatase: 96 U/L (ref 39–117)
BUN: 11 mg/dL (ref 6–23)
CO2: 26 mEq/L (ref 19–32)
Calcium: 9.6 mg/dL (ref 8.4–10.5)
Chloride: 104 mEq/L (ref 96–112)
Creatinine, Ser: 0.99 mg/dL (ref 0.50–1.35)
GFR calc Af Amer: >90 mL/min (ref >90)
GFR calc non Af Amer: 82 mL/min — ABNORMAL LOW (ref >90)
Glucose, Bld: 99 mg/dL (ref 70–99)
Potassium: 5 mEq/L (ref 3.5–5.1)
Sodium: 142 mEq/L (ref 135–145)
Total Bilirubin: 0.5 mg/dL (ref 0.3–1.2)
Total Protein: 7.3 g/dL (ref 6.0–8.3)

## 2012-06-26 LAB — CBC
HCT: 38.4 % — ABNORMAL LOW (ref 39.0–52.0)
Hemoglobin: 13.5 g/dL (ref 13.0–17.0)
MCH: 33.7 pg (ref 26.0–34.0)
MCHC: 35.2 g/dL (ref 30.0–36.0)
MCV: 95.8 fL (ref 78.0–100.0)
Platelets: 129 10*3/uL — ABNORMAL LOW (ref 150–400)
RBC: 4.01 MIL/uL — ABNORMAL LOW (ref 4.22–5.81)
RDW: 13 % (ref 11.5–15.5)
WBC: 4.9 10*3/uL (ref 4.0–10.5)

## 2012-06-26 LAB — URINE MICROSCOPIC-ADD ON

## 2012-06-26 LAB — PRO B NATRIURETIC PEPTIDE: Pro B Natriuretic peptide (BNP): 252.8 pg/mL — ABNORMAL HIGH (ref 0–125)

## 2012-06-26 LAB — CK: Total CK: 184 U/L (ref 7–232)

## 2012-06-26 MED ORDER — ACETAMINOPHEN 650 MG RE SUPP: 650.0000 mg | Freq: Four times a day (QID) | RECTAL | Status: DC | PRN

## 2012-06-26 MED ORDER — SODIUM CHLORIDE 0.9 % IJ SOLN: 3.0000 mL | INTRAMUSCULAR | Status: DC | PRN

## 2012-06-26 MED ORDER — SODIUM CHLORIDE 0.9 % IV SOLN: 250.0000 mL | INTRAVENOUS | Status: DC | PRN

## 2012-06-26 MED ORDER — HYDROCODONE-ACETAMINOPHEN 5-325 MG PO TABS
1.0000 | ORAL_TABLET | Freq: Four times a day (QID) | ORAL | Status: DC | PRN
  Administered 2012-06-26 – 2012-06-27 (×2): 1 via ORAL
  Filled 2012-06-26 (×2): qty 1

## 2012-06-26 MED ORDER — ONDANSETRON HCL 4 MG PO TABS: 4.0000 mg | ORAL_TABLET | Freq: Four times a day (QID) | ORAL | Status: DC | PRN

## 2012-06-26 MED ORDER — PANTOPRAZOLE SODIUM 40 MG PO TBEC
40.0000 mg | DELAYED_RELEASE_TABLET | Freq: Every day | ORAL | Status: DC
  Administered 2012-06-26 – 2012-06-27 (×2): 40 mg via ORAL
  Filled 2012-06-26 (×2): qty 1

## 2012-06-26 MED ORDER — ASPIRIN 81 MG PO CHEW
81.0000 mg | CHEWABLE_TABLET | Freq: Every day | ORAL | Status: DC
  Administered 2012-06-26 – 2012-06-27 (×2): 81 mg via ORAL
  Filled 2012-06-26 (×2): qty 1

## 2012-06-26 MED ORDER — PRIMIDONE 250 MG PO TABS
375.0000 mg | ORAL_TABLET | Freq: Two times a day (BID) | ORAL | Status: DC
  Administered 2012-06-26 – 2012-06-27 (×3): 375 mg via ORAL
  Filled 2012-06-26 (×5): qty 2

## 2012-06-26 MED ORDER — PROPRANOLOL HCL 80 MG PO TABS
80.0000 mg | ORAL_TABLET | Freq: Two times a day (BID) | ORAL | Status: DC
  Administered 2012-06-26 – 2012-06-27 (×3): 80 mg via ORAL
  Filled 2012-06-26 (×4): qty 1

## 2012-06-26 MED ORDER — ATORVASTATIN CALCIUM 40 MG PO TABS
40.0000 mg | ORAL_TABLET | Freq: Every day | ORAL | Status: DC
  Administered 2012-06-26 – 2012-06-27 (×2): 40 mg via ORAL
  Filled 2012-06-26 (×2): qty 1

## 2012-06-26 MED ORDER — GABAPENTIN 300 MG PO CAPS
600.0000 mg | ORAL_CAPSULE | Freq: Every day | ORAL | Status: DC
  Administered 2012-06-26: 600 mg via ORAL
  Filled 2012-06-26 (×2): qty 2

## 2012-06-26 MED ORDER — ACETAMINOPHEN 325 MG PO TABS
650.0000 mg | ORAL_TABLET | Freq: Four times a day (QID) | ORAL | Status: DC | PRN
  Filled 2012-06-26: qty 2

## 2012-06-26 MED ORDER — OMEPRAZOLE MAGNESIUM 20 MG PO TBEC: 20.0000 mg | DELAYED_RELEASE_TABLET | Freq: Every day | ORAL | Status: DC

## 2012-06-26 MED ORDER — LORAZEPAM 0.5 MG PO TABS: 0.5000 mg | ORAL_TABLET | Freq: Every day | ORAL | Status: DC | PRN

## 2012-06-26 MED ORDER — ONDANSETRON HCL 4 MG/2ML IJ SOLN: 4.0000 mg | Freq: Four times a day (QID) | INTRAMUSCULAR | Status: DC | PRN

## 2012-06-26 MED ORDER — SODIUM CHLORIDE 0.9 % IJ SOLN
3.0000 mL | Freq: Two times a day (BID) | INTRAMUSCULAR | Status: DC
  Administered 2012-06-26 – 2012-06-27 (×2): 3 mL via INTRAVENOUS

## 2012-06-26 MED ORDER — SODIUM CHLORIDE 0.9 % IJ SOLN: 3.0000 mL | Freq: Two times a day (BID) | INTRAMUSCULAR | Status: DC

## 2012-06-26 MED ORDER — NITROGLYCERIN 0.4 MG SL SUBL: 0.4000 mg | SUBLINGUAL_TABLET | SUBLINGUAL | Status: DC | PRN

## 2012-06-26 MED ORDER — PREGABALIN 75 MG PO CAPS
75.0000 mg | ORAL_CAPSULE | Freq: Two times a day (BID) | ORAL | Status: DC
  Administered 2012-06-26 – 2012-06-27 (×3): 75 mg via ORAL
  Filled 2012-06-26 (×3): qty 1

## 2012-06-26 NOTE — H&P (Signed)
[facility]MTS Attending Admission Note: Jeff Walden MD Personal pager:  [phone] FPTS Service Pager:  [phone]  I  have seen and examined this patient, reviewed their chart. I have discussed this patient with the resident. I agree with the resident's findings, assessment and care plan.  Please see my separate note for details.  

## 2012-06-26 NOTE — H&P (Signed)
Family Medicine Teaching [facility] Admission History and Physical Service Pager: [phone]  Patient name: Brendan [name] Medical record number: [id number] Date of birth: 1940/11/18 Age: 72 y.o. Gender: male  Primary Care Brendan [name]: BOOTH, Denny Peon, MD  Chief Complaint: recent fall at home  Assessment and Plan: Brendan [name] is a 72 y.o. year old male with a history of CAD s/p CABG, CHF, HTN, benign essential tremor, and complex regional pain syndrome presenting from clinic following an unwitnessed fall on Sunday night.   1. Recent fall: differential for fall in this patient includes gait instability due to tremor (question of parkinsons), CVA, medication intolerance, vasovagal event, cardiac cause, or orthostasis. Given physical exam and description of patients gait it is highly likely that these falls are related to his tremor and underlying gait instability. Question of whether this is related to parkinsons. -admit to telemetry, attending Brendan. Lum Babe. -will order CT head to evaluate for hematoma given recent fall -CK normal ruling out rhabdo from long lie syndrome -will check BNP and keep on tele to evaluate for cardiac contribution to falls. History EF 40-45% and 1+ edema noted.  -CBC and CMET, UA to eval for additional causes of recent fall -PT/OT to evaluate -will continue home primidone for tremor -will obtain orthostatic vital signs  2. Right hand injury: swollen and painful on exam, concern for fracture -will XR hand -tylenol and vicodin prn for pain  3. CV: extensive history CAD, CABG, and CHF -will continue home ASA, propranolol -holding home lisinopril at this time as patient has this listed as allergy -will check BNP per above  4. HLD: will continue home lipitor  5. Chronic regional pain syndrome:  -will continue home gabapentin and lyrica, additionally vicodin prn for pain -will hold home scheduled tramadol at this time due to concern for orhtostatic  hypotension  6. GERD: continue PPI  7. Anxiety: will continue hom eativan 0.5 mg prn   8. BPH: will hold flomax at this time for concern for orthostatic hypotension  FEN/GI: heart healthy diet Prophylaxis: SCDs Disposition: admit to tele, discharge pending completion of evaluation  History of Present Illness: Brendan [name] is a 72 y.o. year old male with a history of CAD s/p CABG, CHF, HTN, benign essential tremor, and complex regional pain syndrome presenting from clinic following an unwitnessed fall on Sunday night. History obtained from patient in conjunction with wife and daughter.  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 His legs felt tired and gave out beneath him. He was found down by his neighbor and was on the ground for an unknown amount of time. States he did not feel dizzy or pass out during this episode. He fell on his right side and hurt his right hand and scratched his leg. He states he did not hit his head.  Per the patients family members he has had unsteady gait since this past summer when he had several falls. When asked to describe gait, his wife mimicked a shuffling gate with no arm movement and patient hunched over. Endorses eating and drinking well. Additionally endorses edema in LE. Recently diagnosed with benign essential tremor and daughter states she was told this was not parkinsons.  He denies chest pain, shortness of breath, headache, vision changes, hearing changes, and abdominal pain.  Patient Active Problem List  Diagnosis  . HYPERLIPIDEMIA, MIXED  . OVERWEIGHT  . TREMOR, ESSENTIAL  . HYPERTENSION, BENIGN  . ANGINA, STABLE  . CORONARY ATHEROSCLEROSIS,  ARTERY BYPASS GRAFT  . GERD  . Incontinence  . Anxiety  . Microscopic hematuria  . Right leg pain  . BPH (benign prostatic hyperplasia)  . Systolic CHF  . Right hand pain  . Pleural plaque with presence of asbestos  . Complex regional pain syndrome   Past Medical  History: Past Medical History  Diagnosis Date  . Angina pectoris syndrome   . Tremor, essential     On Primidone  . Hypertension   . Hyperlipidemia   . Glaucoma   . Coronary atherosclerosis of artery bypass graft   . Hiatal hernia     GI: Brendan [name]  . Hx of cardiac cath 2002    LAD disease, EF 60%.  Cardiologist is Brendan [name].   Past Surgical History: Past Surgical History  Procedure Laterality Date  . Coronary artery bypass graft  06/2002    LIMA-LAD, SVG-D1, SVG-OM  . Cardiovascular stress test  03/2009    By Brendan Fredrich Birks, Negative  . Shoulder orthoscopic surgery   06/2003   Social History: History  Substance Use Topics  . Smoking status: Former Smoker -- 3.00 packs/day for 20 years    Types: Cigarettes    Quit date: 04/17/1968  . Smokeless tobacco: Never Used  . Alcohol Use: Yes     Comment: Occasional 2 mixed drinks or 3 beers.   For any additional social history documentation, please refer to relevant sections of EMR.  Family History: Family History  Problem Relation Age of Onset  . Heart disease Mother    Allergies: Allergies  Allergen Reactions  . Lisinopril     REACTION: COUGH   No current facility-administered medications on file prior to encounter.   Current Outpatient Prescriptions on File Prior to Encounter  Medication Sig Dispense Refill  . aspirin (LONGS ADULT LOW STRENGTH ASA) 81 MG EC tablet Take 81 mg by mouth daily.        [name] gabapentin (NEURONTIN) 300 MG capsule One po qhs for 4 days then 2 po qhs  90 capsule  0  . lisinopril (PRINIVIL,ZESTRIL) 10 MG tablet Take 10 mg by mouth daily.      [name] LORazepam (ATIVAN) 0.5 MG tablet Take 1 tablet (0.5 mg total) by mouth daily as needed. For anxiety  30 tablet  5  . nitroGLYCERIN (NITROSTAT) 0.4 MG SL tablet Place 1 tablet (0.4 mg total) under the tongue every 5 (five) minutes as needed for chest pain. call 911 if chest pain not better  30 tablet  3  . Omega-3 Fatty Acids (FISH OIL PO) Take by mouth daily.         [name] omeprazole (PRILOSEC OTC) 20 MG tablet Take 1 tablet (20 mg total) by mouth daily.  30 tablet  5  . pregabalin (LYRICA) 75 MG capsule Take 1 capsule (75 mg total) by mouth 2 (two) times daily.  60 capsule  0  . primidone (MYSOLINE) 250 MG tablet Take 1.5 tablets (375 mg total) by mouth 2 (two) times daily.  270 tablet  4  . propranolol (INDERAL) 80 MG tablet Take 1 tablet (80 mg total) by mouth 2 (two) times daily.  180 tablet  4  . simvastatin (ZOCOR) 80 MG tablet Take 1 tablet (80 mg total) by mouth at bedtime.  90 tablet  4  . Tamsulosin HCl (FLOMAX) 0.4 MG CAPS       . traMADol (ULTRAM) 50 MG tablet Take 1 tablet (50 mg total) by mouth 3 (three) times daily as  needed for pain.  120 tablet  0   Review Of Systems: Per HPI with the following additions: none Otherwise 12 point review of systems was performed and was unremarkable.  Physical Exam: BP 129/70  Pulse 58  Temp(Src) 97.8 F (36.6 C) (Oral)  Resp 20  Ht 5\' 4"  (1.626 m)  Wt 188 lb 8 oz (85.503 kg)  BMI 32.34 kg/m2  SpO2 97% Exam: General: NAD, laying comfortably in bed with visible tremor worse in left hand and head HEENT: NCAT, MMM, PERRL Cardiovascular: rrr, no mrg appreciated Respiratory: CTAB, no wheezes or crackles Abdomen: S, NT, ND, no masses Extremities: 1-2+ pitting edema, no cyanosis, right hand with swelling over posterior aspect greater on the lateral side Neuro: alert, oriented x4, strength 5/5 throughout, sensation to light touch intact, CN 2-12 intact, gait deferred until PT eval due to reported instability  Labs and Imaging:  Results for orders placed during the hospital encounter of 06/26/12 (from the past 24 hour(s))  CBC     Status: Abnormal   Collection Time    06/26/12  2:10 PM      Result Value Range   WBC 4.9  4.0 - 10.5 K/uL   RBC 4.01 (*) 4.22 - 5.81 MIL/uL   Hemoglobin 13.5  13.0 - 17.0 g/dL   HCT 16.1 (*) 09.6 - 04.5 %   MCV 95.8  78.0 - 100.0 fL   MCH 33.7  26.0 - 34.0 pg   MCHC  35.2  30.0 - 36.0 g/dL   RDW 40.9  81.1 - 91.4 %   Platelets 129 (*) 150 - 400 K/uL  CK     Status: None   Collection Time    06/26/12  2:10 PM      Result Value Range   Total CK 184  7 - 232 U/L  COMPREHENSIVE METABOLIC PANEL     Status: Abnormal   Collection Time    06/26/12  2:10 PM      Result Value Range   Sodium 142  135 - 145 mEq/L   Potassium 5.0  3.5 - 5.1 mEq/L   Chloride 104  96 - 112 mEq/L   CO2 26  19 - 32 mEq/L   Glucose, Bld 99  70 - 99 mg/dL   BUN 11  6 - 23 mg/dL   Creatinine, Ser 7.82  0.50 - 1.35 mg/dL   Calcium 9.6  8.4 - 95.6 mg/dL   Total Protein 7.3  6.0 - 8.3 g/dL   Albumin 4.1  3.5 - 5.2 g/dL   AST 18  0 - 37 U/L   ALT 12  0 - 53 U/L   Alkaline Phosphatase 96  39 - 117 U/L   Total Bilirubin 0.5  0.3 - 1.2 mg/dL   GFR calc non Af Amer 82 (*) >90 mL/min   GFR calc Af Amer >90  >90 mL/min     Marikay Alar, MD 06/26/2012, 2:54 PM   Family Medicine Upper Level Addendum:   I have discussed the history and physical with both Brendan. Gwendolyn Grant and Brendan. Birdie Sons and agree with their assessment. I have fully reviewed the H+P and agree with it's contents with the additions as noted in blue text.    Tana Conch, MD, PGY2 06/26/2012 5:49 PM

## 2012-06-26 NOTE — Progress Notes (Signed)
Brendan [name] [id number] Admission Data: 06/26/2012 2:01 PM Attending Provider: Barbaraann Barthel, MD  VHQ:IONGE, Denny Peon, MD Consults/ Treatment Team:    Brendan [name] is a 72 y.o. male patient admitted from the doctor's office awake,  From home with wife. alert  & orientated  X 3,  No Order, VSS - Blood pressure 129/70, pulse 58, temperature 97.8 F (36.6 C), temperature source Oral, resp. rate 20, height 5\' 4"  (1.626 m), weight 85.503 kg (188 lb 8 oz), SpO2 97.00%.,no c/o shortness of breath, no c/o chest pain, no distress noted. No IV site. Family practice MD called for orders and to see pt.Allergies:  Lisinipril Allergies  Allergen Reactions  . Lisinopril     REACTION: COUGH     Past Medical History  Diagnosis Date  . Angina pectoris syndrome   . Tremor, essential     On Primidone  . Hypertension   . Hyperlipidemia   . Glaucoma   . Coronary atherosclerosis of artery bypass graft   . Hiatal hernia     GI: Dr Evette Cristal  . Hx of cardiac cath 2002    LAD disease, EF 60%.  Cardiologist is Dr Clarene Duke.     Pt orientation to unit, room and routine. Information packet given to patient/family and safety video watched.  Admission INP armband ID verified with patient/family, and in place. SR up x 2, fall risk assessment complete with Patient and family verbalizing understanding of risks associated with falls. Pt verbalizes an understanding of how to use the call bell and to call for help before getting out of bed.  Skin, clean-dry- intact without evidence of bruising, or skin tears.   No evidence of skin break down noted on exam. Edema in right hand +1 pitting. Edema in bilateral lower extremities, +2 .     Will cont to monitor and assist as needed.  Cindra Eves, RN 06/26/2012 2:01 PM

## 2012-06-26 NOTE — H&P (Signed)
Brendan [name] is an 72 y.o. male.   Chief Complaint: altered mental status HPI: 72 yo M with increasing history of falls, fell again on Sunday.  Was found down by neighbor after unclear period of time.  States he fell on Right side, questionable whether he hit his head.  Family concerned, stating that memory has worsened, gait has worsened since Sunday.  Lives at home with wife who feels she cannot care for him anymore.  Denies headaches, vision changes, fevers/chills.    Past Medical History  Diagnosis Date  . Angina pectoris syndrome   . Tremor, essential     On Primidone  . Hypertension   . Hyperlipidemia   . Glaucoma   . Coronary atherosclerosis of artery bypass graft   . Hiatal hernia     GI: Dr Evette Cristal  . Hx of cardiac cath 2002    LAD disease, EF 60%.  Cardiologist is Dr Clarene Duke.    Past Surgical History  Procedure Laterality Date  . Coronary artery bypass graft  06/2002    LIMA-LAD, SVG-D1, SVG-OM  . Cardiovascular stress test  03/2009    By Dr Fredrich Birks, Negative  . Shoulder orthoscopic surgery   06/2003    Family History  Problem Relation Age of Onset  . Heart disease Mother    Social History:  reports that he quit smoking about 44 years ago. His smoking use included Cigarettes. He has a 60 pack-year smoking history. He has never used smokeless tobacco. He reports that  drinks alcohol. He reports that he does not use illicit drugs.  Allergies:  Allergies  Allergen Reactions  . Lisinopril     REACTION: COUGH     (Not in a hospital admission)  No results found for this or any previous visit (from the past 48 hour(s)). No results found.  ROS  Blood pressure 115/71, pulse 54, temperature 98 F (36.7 C), temperature source Oral, height 5\' 5"  (1.651 m), weight 192 lb 2 oz (87.147 kg). Physical Exam  Gen:  Alert, cooperative patient who appears stated age in no acute distress.  Vital signs reviewed.  Stigmata of Parkinson's present  HEENT:  [place]/AT.  EOMI, PERRL.   MMM, tonsils non-erythematous, non-edematous.  External ears WNL, Bilateral TM's normal without retraction, redness or bulging.  Neck:  No JVD, supple. Cardiac:  Regular rate and rhythm without murmur auscultated.  Good S1/S2. Pulm:  Clear to auscultation bilaterally with good air movement.  No wheezes or rales noted.   Abd:  Soft/nondistended/nontender.  Good bowel sounds throughout all four quadrants.  No masses noted.  Ext:  LUE with swelling along hand.  TTP along 1st and 2nd metacarpals.  RUE WNL.  Bl LE's with +2 pitting edema to mid-shins.  Psych:  Pleasant, not depressed or anxious appearing Neuro:  Oriented x 3.  Immediate recall 3/3, after 5 minutes 0/3.  Pill-rolling tremor at rest along with head tremor.  Intention tremor noted with movement.  Wide shuffling gait.  Unstable on his feet Assessment/Plan 1.  Falls and altered mental status: - CT head to rule out subdural hematoma.  Low liklihood but want to rule out.  - I wonder if he has Parkinson's.  Seen at Neuro about 8 months ago, diagnosed with essential tremor at that time.  - I can see images of prior MRI without read.  No evidence of hydrocephalus.  - xray of Right hand to assess for any fracture   2.  Gait instability: - as above.  -  PT/OT for assessment and possible placement  Please see resident note for further chronic medical conditions.  Continue chronic meds for now.     WALDEN,JEFF 06/26/2012, 12:01 PM

## 2012-06-27 ENCOUNTER — Ambulatory Visit: Payer: Medicare Other | Admitting: Emergency Medicine

## 2012-06-27 DIAGNOSIS — G589 Mononeuropathy, unspecified: Secondary | ICD-10-CM

## 2012-06-27 LAB — BASIC METABOLIC PANEL
BUN: 13 mg/dL (ref 6–23)
CO2: 23 mEq/L (ref 19–32)
Calcium: 9.2 mg/dL (ref 8.4–10.5)
Chloride: 104 mEq/L (ref 96–112)
Creatinine, Ser: 1 mg/dL (ref 0.50–1.35)
GFR calc Af Amer: 87 mL/min — ABNORMAL LOW (ref >90)
GFR calc non Af Amer: 75 mL/min — ABNORMAL LOW (ref >90)
Glucose, Bld: 74 mg/dL (ref 70–99)
Potassium: 4.1 mEq/L (ref 3.5–5.1)
Sodium: 142 mEq/L (ref 135–145)

## 2012-06-27 LAB — CBC
HCT: 35.7 % — ABNORMAL LOW (ref 39.0–52.0)
Hemoglobin: 12.6 g/dL — ABNORMAL LOW (ref 13.0–17.0)
MCH: 33.4 pg (ref 26.0–34.0)
MCHC: 35.3 g/dL (ref 30.0–36.0)
MCV: 94.7 fL (ref 78.0–100.0)
Platelets: 125 10*3/uL — ABNORMAL LOW (ref 150–400)
RBC: 3.77 MIL/uL — ABNORMAL LOW (ref 4.22–5.81)
RDW: 13.1 % (ref 11.5–15.5)
WBC: 4.3 10*3/uL (ref 4.0–10.5)

## 2012-06-27 MED ORDER — PRIMIDONE 250 MG PO TABS
250.0000 mg | ORAL_TABLET | Freq: Two times a day (BID) | ORAL | Status: DC
  Filled 2012-06-27: qty 1

## 2012-06-27 MED ORDER — PRIMIDONE 250 MG PO TABS: 250.0000 mg | ORAL_TABLET | Freq: Two times a day (BID) | ORAL | Status: DC

## 2012-06-27 NOTE — Progress Notes (Signed)
Family Medicine Teaching Service Daily Progress Note Service Page: [phone]  Subjective: Patient doing well this morning. States right hand hurts though has no need for pain medications.  Objective: Temp:  [97.8 F (36.6 C)-98.9 F (37.2 C)] 97.8 F (36.6 C) (03/13 0517) Pulse Rate:  [54-66] 60 (03/13 0517) Resp:  [18-20] 18 (03/13 0517) BP: (104-149)/(64-84) 104/64 mmHg (03/13 0517) SpO2:  [91 %-98 %] 91 % (03/13 0517) Weight:  [188 lb 8 oz (85.503 kg)-192 lb 2 oz (87.147 kg)] 188 lb 15 oz (85.7 kg) (03/13 0517) Exam: General: NAD, resting comfortably in bed Cardiovascular: rrr, no mrg Respiratory: CTAB anteriorly, no wheezes or crackles Abdomen: s, NT, ND Extremities: SCDs in place  CBC BMET   Recent Labs Lab 06/26/12 1410 06/27/12 0525  WBC 4.9 4.3  HGB 13.5 12.6*  HCT 38.4* 35.7*  PLT 129* 125*    Recent Labs Lab 06/26/12 1410 06/27/12 0525  NA 142 142  K 5.0 4.1  CL 104 104  CO2 26 23  BUN 11 13  CREATININE 0.99 1.00  GLUCOSE 99 74  CALCIUM 9.6 9.2     BNP 252 UA trace hgb and 15 ketones  Imaging/Diagnostic Tests: Ct Head Wo Contrast  06/27/2012 IMPRESSION: No acute intracranial abnormalities. Generalized atrophy.   Original Report Authenticated By: Ulyses Southward, M.D.    Dg Hand Complete Right  06/26/2012 IMPRESSION:  1.  No acute abnormality or significant interval change. 2.  Atherosclerotic changes are again noted.   Original Report Authenticated By: Marin Roberts, M.D.    Assessment/Plan: Brendan [name] is a 72 y.o. year old male with a history of CAD s/p CABG, CHF, HTN, benign essential tremor, and complex regional pain syndrome presenting from clinic following an unwitnessed fall on Sunday night.   1. Recent fall: differential for fall in this patient includes gait instability due to tremor (question of parkinsons), medication intolerance, vasovagal event, cardiac cause, or orthostasis. Given physical exam and description of patients gait  it is highly likely that these falls are related to his tremor and underlying gait instability. Question of whether this is related to parkinsons.  -CT head negative for acute abnormalities  -CK normal ruling out rhabdo from long lie syndrome  -normal BNP and no events on tele-will continue to monitor  -PT/OT to evaluate  -will discontinue primidone at this time given its risk for ataxia and see if this resolved patients problem  -will obtain orthostatic vital signs   2. Right hand injury: swollen and painful on exam, concern for fracture  -will XR hand  -tylenol and vicodin prn for pain   3. CV: extensive history CAD, CABG, and CHF  -will continue home ASA, propranolol  -holding home lisinopril at this time as patient has this listed as allergy  -BNP normal   4. HLD: will continue home lipitor   5. Chronic regional pain syndrome:  -will continue home gabapentin and lyrica, additionally vicodin prn for pain  -will hold home scheduled tramadol at this time due to concern for orhtostatic hypotension   6. GERD: continue PPI   7. Anxiety: will continue home ativan 0.5 mg prn   8. BPH: will hold flomax at this time for concern for orthostatic hypotension   FEN/GI: heart healthy diet  Prophylaxis: SCDs  Disposition: discharge pending completion of evaluation   Marikay Alar, MD 06/27/2012, 9:50 AM

## 2012-06-27 NOTE — Discharge Summary (Signed)
Physician Discharge Summary  Patient ID: Brendan [name] MRN: [id number] DOB: Jan 20, 1941 Age: 72 y.o.  Admit date: 06/26/2012 Discharge date: 06/27/2012 Admitting Physician: Barbaraann Barthel, MD  PCP: Despina Hick, MD  Consultants: none  Discharge Diagnosis: Principal Problem:   Abnormality of gait Active Problems:   HYPERLIPIDEMIA, MIXED   HYPERTENSION, BENIGN   CORONARY ATHEROSCLEROSIS, ARTERY BYPASS GRAFT   GERD   Anxiety   BPH (benign prostatic hyperplasia)   Systolic CHF   Right hand pain   Complex regional pain syndrome    Hospital Course Brendan [name] is a 72 y.o. year old male with a history of CAD s/p CABG, CHF, HTN, benign essential tremor, and complex regional pain syndrome presenting from clinic following an unwitnessed fall on Sunday night.   1. Recent fall: patient presented as admission from clinic for recent history of falls. Patient stated legs felt weak and gave out. Denied head injury. Stated fell on right side. Has recent history of benign essential tremor. Obtained CT head for concern of head injury following fall that revealed no acute abnormalities. XR right hand with no acute injuries. CK normal ruling out rhabdo from long lie syndrome. Normal BNP and no events on tele indicating less likely related to cardiac cause. PT/OT recommended outpatient and home health respectively. Additionally primidone was decreased as this can contribute to ataxia. Held home flomax as well.   2. Right hand injury: injured during fall. Swollen and painful on exam, concerned for fracture which was ruled out by XR. Tylenol and vicodin for pain.    3. CV: extensive history CAD, CABG, and CHF.Continued home ASA, propranolol. Held home lisinopril as patient has this listed as allergy. BNP normal.  4. HLD: continued home lipitor   5. Chronic regional pain syndrome: Continued home gabapentin and lyrica, additionally vicodin prn for pain. Held home scheduled tramadol due to concern for  orhtostatic hypotension.  6. GERD: continued PPI.   7. Anxiety: continued home ativan 0.5 mg prn   8. BPH: held flomax at this time for concern for orthostatic hypotension   Problem List 1. Falls 2. Right hand injury 3. CAD s/p CABG 4. HLD 5. CHF 6. GERD 7. Anxiety 8. BPH         Discharge PE   Filed Vitals:   06/27/12 1405  BP: 118/68  Pulse: 96  Temp: 98.1 F (36.7 C)  Resp: 20   General: NAD, resting comfortably in bed  Cardiovascular: rrr, no mrg  Respiratory: CTAB anteriorly, no wheezes or crackles  Abdomen: s, NT, ND  Extremities: SCDs in place   Procedures/Imaging:  Ct Head Wo Contrast  06/27/2012  *RADIOLOGY REPORT*  Clinical Data: Increasing falls, fell again 3 days ago, uncertain if struck head, memory impairment, worsened gait, history hypertension, coronary artery disease post CABG  CT HEAD WITHOUT CONTRAST  Technique:  Contiguous axial images were obtained from the base of the skull through the vertex without contrast.  Comparison: None  Findings: Generalized atrophy. Normal ventricular morphology. No midline shift or mass effect. No intracranial hemorrhage, mass lesion or evidence of acute infarction. No extra-axial fluid collections. Incomplete posterior arch of C1, normal variant. Scattered atherosclerotic calcifications at skull base. Bones and sinuses otherwise unremarkable.  IMPRESSION: No acute intracranial abnormalities. Generalized atrophy.   Original Report Authenticated By: Ulyses Southward, M.D.    Dg Hand Complete Right  06/26/2012  *RADIOLOGY REPORT*  Clinical Data: Chronic right hand pain.  Pain over the metacarpals.  RIGHT HAND - COMPLETE  3+ VIEW  Comparison: Three views of the right hand 02/21/2012  Findings: No acute bone or soft tissue abnormalities are present. Mild degenerative changes are similar to the prior exam.  Vascular calcifications are noted.  IMPRESSION:  1.  No acute abnormality or significant interval change. 2.  Atherosclerotic changes  are again noted.   Original Report Authenticated By: Marin Roberts, M.D.     Labs  CBC  Recent Labs Lab 06/26/12 1410 06/27/12 0525  WBC 4.9 4.3  HGB 13.5 12.6*  HCT 38.4* 35.7*  PLT 129* 125*   BMET  Recent Labs Lab 06/26/12 1410 06/27/12 0525  NA 142 142  K 5.0 4.1  CL 104 104  CO2 26 23  BUN 11 13  CREATININE 0.99 1.00  CALCIUM 9.6 9.2  PROT 7.3  --   BILITOT 0.5  --   ALKPHOS 96  --   ALT 12  --   AST 18  --   GLUCOSE 99 74   Results for orders placed during the hospital encounter of 06/26/12 (from the past 72 hour(s))  CBC     Status: Abnormal   Collection Time    06/26/12  2:10 PM      Result Value Range   WBC 4.9  4.0 - 10.5 K/uL   RBC 4.01 (*) 4.22 - 5.81 MIL/uL   Hemoglobin 13.5  13.0 - 17.0 g/dL   HCT 01.0 (*) 27.2 - 53.6 %   MCV 95.8  78.0 - 100.0 fL   MCH 33.7  26.0 - 34.0 pg   MCHC 35.2  30.0 - 36.0 g/dL   RDW 64.4  03.4 - 74.2 %   Platelets 129 (*) 150 - 400 K/uL  CK     Status: None   Collection Time    06/26/12  2:10 PM      Result Value Range   Total CK 184  7 - 232 U/L  COMPREHENSIVE METABOLIC PANEL     Status: Abnormal   Collection Time    06/26/12  2:10 PM      Result Value Range   Sodium 142  135 - 145 mEq/L   Potassium 5.0  3.5 - 5.1 mEq/L   Chloride 104  96 - 112 mEq/L   CO2 26  19 - 32 mEq/L   Glucose, Bld 99  70 - 99 mg/dL   BUN 11  6 - 23 mg/dL   Creatinine, Ser 5.95  0.50 - 1.35 mg/dL   Calcium 9.6  8.4 - 63.8 mg/dL   Total Protein 7.3  6.0 - 8.3 g/dL   Albumin 4.1  3.5 - 5.2 g/dL   AST 18  0 - 37 U/L   ALT 12  0 - 53 U/L   Alkaline Phosphatase 96  39 - 117 U/L   Total Bilirubin 0.5  0.3 - 1.2 mg/dL   GFR calc non Af Amer 82 (*) >90 mL/min   GFR calc Af Amer >90  >90 mL/min   Comment:            The eGFR has been calculated     using the CKD EPI equation.     This calculation has not been     validated in all clinical     situations.     eGFR's persistently     <90 mL/min signify     possible Chronic  Kidney Disease.  PRO B NATRIURETIC PEPTIDE     Status: Abnormal   Collection Time  06/26/12  4:28 PM      Result Value Range   Pro B Natriuretic peptide (BNP) 252.8 (*) 0 - 125 pg/mL  URINALYSIS, ROUTINE W REFLEX MICROSCOPIC     Status: Abnormal   Collection Time    06/26/12  6:32 PM      Result Value Range   Color, Urine YELLOW  YELLOW   APPearance CLEAR  CLEAR   Specific Gravity, Urine 1.011  1.005 - 1.030   pH 5.5  5.0 - 8.0   Glucose, UA NEGATIVE  NEGATIVE mg/dL   Hgb urine dipstick TRACE (*) NEGATIVE   Bilirubin Urine NEGATIVE  NEGATIVE   Ketones, ur 15 (*) NEGATIVE mg/dL   Protein, ur NEGATIVE  NEGATIVE mg/dL   Urobilinogen, UA 1.0  0.0 - 1.0 mg/dL   Nitrite NEGATIVE  NEGATIVE   Leukocytes, UA NEGATIVE  NEGATIVE  URINE MICROSCOPIC-ADD ON     Status: None   Collection Time    06/26/12  6:32 PM      Result Value Range   RBC / HPF 0-2  <3 RBC/hpf  BASIC METABOLIC PANEL     Status: Abnormal   Collection Time    06/27/12  5:25 AM      Result Value Range   Sodium 142  135 - 145 mEq/L   Potassium 4.1  3.5 - 5.1 mEq/L   Comment: DELTA CHECK NOTED   Chloride 104  96 - 112 mEq/L   CO2 23  19 - 32 mEq/L   Glucose, Bld 74  70 - 99 mg/dL   BUN 13  6 - 23 mg/dL   Creatinine, Ser 0.98  0.50 - 1.35 mg/dL   Calcium 9.2  8.4 - 11.9 mg/dL   GFR calc non Af Amer 75 (*) >90 mL/min   GFR calc Af Amer 87 (*) >90 mL/min   Comment:            The eGFR has been calculated     using the CKD EPI equation.     This calculation has not been     validated in all clinical     situations.     eGFR's persistently     <90 mL/min signify     possible Chronic Kidney Disease.  CBC     Status: Abnormal   Collection Time    06/27/12  5:25 AM      Result Value Range   WBC 4.3  4.0 - 10.5 K/uL   RBC 3.77 (*) 4.22 - 5.81 MIL/uL   Hemoglobin 12.6 (*) 13.0 - 17.0 g/dL   HCT 14.7 (*) 82.9 - 56.2 %   MCV 94.7  78.0 - 100.0 fL   MCH 33.4  26.0 - 34.0 pg   MCHC 35.3  30.0 - 36.0 g/dL   RDW 13.0   86.5 - 78.4 %   Platelets 125 (*) 150 - 400 K/uL       Patient condition at time of discharge/disposition: stable  Disposition-home   Follow up issues: 1. Decreased primidone dosing, patient is to discuss this with neurologist as to whether to continue on this given risk of ataxia and if it should be titrated to discontinuation 2. Continued f/u with neurology regarding CRPS and tremor  Discharge follow up:    Future Appointments Shel[facility] Saxe Department Dept Phone   07/01/2012 2:15 PM Phebe Colla, MD [facility] [phone]   07/10/2012 11:00 AM Ralene Cork, DO [facility] [phone]  Discharge Instructions: Please refer to Patient Instructions section of EMR for full details.  Patient was counseled important signs and symptoms that should prompt return to medical care, changes in medications, dietary instructions, activity restrictions, and follow up appointments.    Discharge Medications   Medication List    TAKE these medications       fish oil-omega-3 fatty acids 1000 MG capsule  Take 1 g by mouth 2 (two) times daily.     gabapentin 300 MG capsule  Commonly known as:  NEURONTIN  One po qhs for 4 days then 2 po qhs     lisinopril 10 MG tablet  Commonly known as:  PRINIVIL,ZESTRIL  Take 10 mg by mouth daily.     LONGS ADULT LOW STRENGTH ASA 81 MG EC tablet  Generic drug:  aspirin  Take 81 mg by mouth daily.     nitroGLYCERIN 0.4 MG SL tablet  Commonly known as:  NITROSTAT  Place 1 tablet (0.4 mg total) under the tongue every 5 (five) minutes as needed for chest pain. call 911 if chest pain not better     omeprazole 20 MG capsule  Commonly known as:  PRILOSEC  Take 20 mg by mouth daily as needed (acid reflux).     pregabalin 75 MG capsule  Commonly known as:  LYRICA  Take 75 mg by mouth 3 (three) times daily. With tramadol     primidone 250 MG tablet  Commonly known as:  MYSOLINE  Take 1 tablet (250 mg total) by  mouth 2 (two) times daily.     propranolol 80 MG tablet  Commonly known as:  INDERAL  Take 1 tablet (80 mg total) by mouth 2 (two) times daily.     simvastatin 80 MG tablet  Commonly known as:  ZOCOR  Take 1 tablet (80 mg total) by mouth at bedtime.     traMADol 50 MG tablet  Commonly known as:  ULTRAM  Take 50 mg by mouth 3 (three) times daily. With Rickard Rhymes, MD of [facility] 06/28/2012 11:37 AM

## 2012-06-27 NOTE — Progress Notes (Signed)
1800 Discharge instructions reviewed with patient and daughter verbalize and understands. Left floor in wheelchair accompanied by staff and wife and daughter. Skin WNL

## 2012-06-27 NOTE — Progress Notes (Signed)
Occupational Therapy Evaluation Patient Details Name: Brendan [name] MRN: [id number] DOB: [birth year] Today's Date: 06/27/2012 Time: 6578-4696 OT Time Calculation (min): 41 min  OT Assessment / Plan / Recommendation Clinical Impression  72 yo with recent fall outdoors. Pt states he injured R hand previously but unable to state how or when. Pt with apparent reflex pain syndrome R hand per discussion with pt/daughter. Rec to family for pt to follow up with Hand MD - given contact number.. At this time, feel pt would benefit from [facility] services given pt's apparent cognitive deficits so that therapist can address home safety and fall issues within the home in attempts to reduce readmission. Discussed HH needs with case manager and MD. Also recommended for pt to follow up with home care services, i.e. [facility], to decrease burden of care on wife.    OT Assessment  All further OT needs can be met in the next venue of care    Follow Up Recommendations  Home health OT    Barriers to Discharge      Equipment Recommendations  Tub/shower seat    Recommendations for Other Services    Frequency       Precautions / Restrictions Precautions Precautions: Fall   Pertinent Vitals/Pain no apparent distress     ADL  Eating/Feeding: Minimal assistance Where Assessed - Eating/Feeding: Edge of bed Upper Body Bathing: Minimal assistance Where Assessed - Upper Body Bathing: Unsupported sitting Lower Body Bathing: Minimal assistance Where Assessed - Lower Body Bathing: Unsupported sit to stand Upper Body Dressing: Minimal assistance Where Assessed - Upper Body Dressing: Unsupported sitting Lower Body Dressing: Minimal assistance Where Assessed - Lower Body Dressing: Unsupported sit to stand Toilet Transfer: Supervision/safety Toilet Transfer Method: Sit to stand Equipment Used: Gait belt;Feeding equipment Transfers/Ambulation Related to ADLs: S ADL Comments: Educated pt/family on available AE  for for feeding. discussed ?RSD RUE and need to follow up with Hand MD.     OT Diagnosis: Generalized weakness  OT Problem List: Decreased strength;Decreased range of motion;Impaired balance (sitting and/or standing);Decreased cognition;Decreased safety awareness;Decreased knowledge of use of DME or AE;Decreased knowledge of precautions;Impaired UE functional use;Increased edema OT Treatment Interventions:     OT Goals Acute Rehab OT Goals OT Goal Formulation:  (eval only)  Visit Information  Last OT Received On: 06/27/12 Assistance Needed: +1    Subjective Data      Prior Functioning     Home Living Lives With: Spouse Available Help at Discharge: Family Type of Home: House Home Access: Stairs to enter [employer] of Steps: 4 Entrance Stairs-Rails: Can reach both Home Layout: One level Bathroom Shower/Tub: Tub/shower unit;Walk-in shower Bathroom Toilet: Standard Bathroom Accessibility: Yes Home Adaptive Equipment: Walker - rolling;Straight cane Prior Function Level of Independence: Independent Able to Take Stairs?: Yes Vocation: Retired Musician: No difficulties Dominant Hand: Left         Vision/Perception Vision - History Baseline Vision: Wears glasses all the time   Cognition  Cognition Overall Cognitive Status: Impaired Area of Impairment: Memory;Safety/judgement;Awareness of errors;Awareness of deficits Arousal/Alertness: Awake/alert Behavior During Session: [facility] for tasks performed Safety/Judgement: Decreased awareness of safety precautions Cognition - Other Comments: daughter confirms baseline cognitive deficits. States current house MD questions dementia    Extremity/Trunk Assessment Right Upper Extremity Assessment RUE ROM/Strength/Tone: Deficits RUE ROM/Strength/Tone Deficits: family states ?RSD but not being followed by hand MD (unable to make complete fist. decreased in hand manipulatisk) RUE Sensation: WFL - Light  Touch RUE Coordination: Deficits Left Upper Extremity  Assessment LUE ROM/Strength/Tone: Deficits     Mobility Transfers Transfers: Sit to Stand;Stand to Sit Sit to Stand: 5: Supervision Stand to Sit: 5: Supervision     Exercise     Balance  S   End of Session OT - End of Session Equipment Utilized During Treatment: Gait belt Activity Tolerance: Patient tolerated treatment well Patient left: in bed;with call bell/phone within reach;with family/visitor present Nurse Communication: Mobility status;Other (comment) (need for [facility] services)  GO Functional Assessment Tool Used: clinical judgement Functional Limitation: Self care Self Care Current Status (Z6109): At least 20 percent but less than 40 percent impaired, limited or restricted Self Care Goal Status (U0454): At least 20 percent but less than 40 percent impaired, limited or restricted Self Care Discharge Status [phone]): At least 20 percent but less than 40 percent impaired, limited or restricted   [facility] 06/27/2012, 5:28 PM [facility], OTR/L  [phone] 06/27/2012

## 2012-06-27 NOTE — Progress Notes (Signed)
PGY-2 Addendum:  Pt had PT and OT evaluations. At this time, the patient appears stable for discharge home with HHPT and HHOT. Pt's daughter at bedside and very supportive. She has spoken with [facility] and has documentation proving that home health services in [facility] have been arranged. Will d/c home with close follow up with PCP next week, as well as follow up with neuro about decrease in Primidone (with plan to stop).  Pt and daughter agree with plan and discharge orders placed.  Amber M. Hairford, M.D. 06/27/2012 6:28 PM

## 2012-06-27 NOTE — Progress Notes (Signed)
Physical Therapy Evaluation Patient Details Name: Brendan [name] MRN: [id number] DOB: March 02, 1941 Today's Date: 06/27/2012 Time: 1100-1140 PT Time Calculation (min): 40 min  PT Assessment / Plan / Recommendation Clinical Impression  Pt is a 72 yo male admitted secondary to a fall. Pt was able to ambulate without the need for assistance on level surfaces. Pt does present with decreased balance when challenged with uneven surfaces and changing speed. Pt was able to self-correct, but limitations evident. Pt  scored a 16/23 on the dynamic gait index.  I do feel pt is safe to d/c home ambulating in the house without the need for assistance. I do recommend follow-up outpatient PT for balance training. I reinforced with the patient he is putting himself at risk for falling if he ambulates outside or attempts to do yardwork/maintance. Pt would benefit from continued skilled PT to focus on balance activities.     PT Assessment  Patient needs continued PT services    Follow Up Recommendations  Outpatient PT    Does the patient have the potential to tolerate intense rehabilitation      Barriers to Discharge        Equipment Recommendations  None recommended by PT    Recommendations for Other Services     Frequency Min 3X/week    Precautions / Restrictions Precautions Precautions: Fall Restrictions Weight Bearing Restrictions: No   Pertinent Vitals/Pain       Mobility  Bed Mobility Bed Mobility: Supine to Sit Supine to Sit: 7: Independent Transfers Transfers: Sit to Stand Sit to Stand: 6: Modified independent (Device/Increase time);From chair/3-in-1;From bed Details for Transfer Assistance: Pt reports he has difficulty getting "started" or getting out of a chair and needs to rock back and forth. Pt did not demonstrate this with me and was able to transfer with the use of his hands without assistance. Ambulation/Gait Ambulation/Gait Assistance: 5: Supervision Ambulation Distance  (Feet): 350 Feet Assistive device: None Ambulation/Gait Assistance Details: Pt is Independent household ambulation. Pt is Supervision on uneven surfaces or when balanced is challanged. Gait Pattern: Step-through pattern;Wide base of support;Decreased trunk rotation Gait velocity: decreased General Gait Details: decreased fluidity of movement Stairs: Yes Stairs Assistance: 6: Modified independent (Device/Increase time) Stair Management Technique: One rail Left Number of Stairs: 6    Exercises     PT Diagnosis: Difficulty walking  PT Problem List: Decreased balance;Decreased mobility PT Treatment Interventions: Gait training;Therapeutic activities;Balance training;Therapeutic exercise;Patient/family education;Stair training   PT Goals Acute Rehab PT Goals PT Goal Formulation: With patient Time For Goal Achievement: 07/04/12 Potential to Achieve Goals: Good Pt will Ambulate: >150 feet;Independently;Other (comment) (on uneven surfaces and when challanged with mod. Indep.) PT Goal: Ambulate - Progress: Goal set today Additional Goals Additional Goal #1: Pt will increase dynamic gait index score by 3 points to improve safety and balance. PT Goal: Additional Goal #1 - Progress: Goal set today  Visit Information  Last PT Received On: 06/27/12 Assistance Needed: +1    Subjective Data  Subjective: I feel like I can go back home. Patient Stated Goal: To return home.   Prior Functioning  Home Living Lives With: Spouse Available Help at Discharge: Family Type of Home: House Home Access: Stairs to enter Secretary/administrator of Steps: 4 Entrance Stairs-Rails: Can reach both Home Layout: One level Home Adaptive Equipment: Walker - rolling;Straight cane Prior Function Level of Independence: Independent Able to Take Stairs?: Yes Vocation: Retired Musician: No difficulties Dominant Hand: Left    Cognition  Cognition Overall  Cognitive Status: Impaired Area of  Impairment: Safety/judgement;Awareness of errors;Awareness of deficits Arousal/Alertness: Awake/alert Orientation Level: Appears intact for tasks assessed Behavior During Session: [facility] for tasks performed Safety/Judgement: Decreased safety judgement for tasks assessed;Decreased awareness of need for assistance Safety/Judgement - Other Comments: Pt has awareness of balance deficits when questioned, but still feels he can walk outside and do yard work.  Awareness of Errors: Assistance required to correct errors made;Assistance required to identify errors made    Extremity/Trunk Assessment Right Upper Extremity Assessment RUE ROM/Strength/Tone: Deficits RUE ROM/Strength/Tone Deficits: edema in wrist/hand, decreased ROM writs and fingers Right Lower Extremity Assessment RLE ROM/Strength/Tone: Within functional levels Left Lower Extremity Assessment LLE ROM/Strength/Tone: Within functional levels Trunk Assessment Trunk Assessment: Other exceptions Trunk Exceptions: cercical ROM limited. Pt reported it has been hard to turn his head for a long time.   Balance Balance Balance Assessed: Yes Static Sitting Balance Static Sitting - Balance Support: No upper extremity supported Static Sitting - Level of Assistance: 7: Independent Dynamic Sitting Balance Dynamic Sitting - Balance Support: No upper extremity supported Dynamic Sitting - Level of Assistance: 7: Independent Standardized Balance Assessment Standardized Balance Assessment: Dynamic Gait Index Dynamic Gait Index Level Surface: Normal Change in Gait Speed: Normal Gait with Horizontal Head Turns: Mild Impairment Gait with Vertical Head Turns: Mild Impairment Gait and Pivot Turn: Mild Impairment Step Over Obstacle: Moderate Impairment Step Around Obstacles: Mild Impairment Steps: Moderate Impairment Total Score: 16 High Level Balance High Level Balance Activites: Side stepping;Backward walking;Direction changes;Turns;Sudden stops;Head  turns High Level Balance Comments: Supervision.Marland KitchenMarland Kitchenpt was able to self correct. Pt demonstrated decreased balance on uneven surfaces. Pt was able to self correct on a declining surface, but did demonstrate some slow balance reaction times to prevent a fall.   End of Session PT - End of Session Equipment Utilized During Treatment: Gait belt Activity Tolerance: Patient tolerated treatment well Patient left: in chair;with call bell/phone within reach Nurse Communication: Mobility status  GP Functional Assessment Tool Used: clinical judgement Functional Limitation: Mobility: Walking and moving around Mobility: Walking and Moving Around Current Status (N8295): At least 1 percent but less than 20 percent impaired, limited or restricted Mobility: Walking and Moving Around Goal Status [phone]): 0 percent impaired, limited or restricted   Greggory Stallion 06/27/2012, 12:27 PM

## 2012-06-27 NOTE — Assessment & Plan Note (Signed)
Likely combination of chronic complex regional dystrophy plus pain from fall.  Please see admit note for full details.

## 2012-06-27 NOTE — Care Management Note (Signed)
    Page 1 of 1   06/27/2012     5:18:17 PM   CARE MANAGEMENT NOTE 06/27/2012  Patient:  Brendan [name], Brendan [name]   Account Number:  [id number]  Date Initiated:  06/27/2012  Documentation initiated by:  Letha Cape  Subjective/Objective Assessment:   dx abnormality of gait  admit as observation- lives with spouse.     Action/Plan:   pt eval- rec outpt pt but pt wants hhpt   Anticipated DC Date:  06/27/2012   Anticipated DC Plan:  HOME W HOME HEALTH SERVICES      DC Planning Services  CM consult      [facility] Choice  HOME HEALTH   Choice offered to / List presented to:  C-3 Spouse        HH arranged  HH-2 PT      HH agency  [facility]   Status of service:  Completed, signed off Medicare Important Message given?   (If response is "NO", the following Medicare IM given date fields will be blank) Date Medicare IM given:   Date Additional Medicare IM given:    Discharge Disposition:  HOME W HOME HEALTH SERVICES  Per UR Regulation:  Reviewed for med. necessity/level of care/duration of stay  If discussed at Long Length of Stay Meetings, dates discussed:    Comments:  06/27/12 17:16 Letha Cape RN, BSN [phone] patient lives with spouse, per physical therapy recs out pt pt, but patient wants hhpt, they chose [facility] from agency list, referral made to [facility], Lupita Leash notified.  Soc will begin 24-48 hrs post discharge.  Patient for dc today.

## 2012-06-27 NOTE — Progress Notes (Signed)
Chief Complaint: altered mental status   HPI: 72 yo M with increasing history of falls, fell again on Sunday. Was found down by neighbor after unclear period of time. States he fell on Right side, questionable whether he hit his head. Family concerned, stating that memory has worsened, gait has worsened since Sunday. Lives at home with wife who feels she cannot care for him anymore. Denies headaches, vision changes, fevers/chills.  Past Medical History   Diagnosis  Date   .  Angina pectoris syndrome    .  Tremor, essential      On Primidone   .  Hypertension    .  Hyperlipidemia    .  Glaucoma    .  Coronary atherosclerosis of artery bypass graft    .  Hiatal hernia      GI: Dr Evette Cristal   .  Hx of cardiac cath  2002     LAD disease, EF 60%. Cardiologist is Dr Clarene Duke.    Past Surgical History   Procedure  Laterality  Date   .  Coronary artery bypass graft   06/2002     LIMA-LAD, SVG-D1, SVG-OM   .  Cardiovascular stress test   03/2009     By Dr Fredrich Birks, Negative   .  Shoulder orthoscopic surgery   06/2003    Family History   Problem  Relation  Age of Onset   .  Heart disease  Mother    Social History: reports that he quit smoking about 44 years ago. His smoking use included Cigarettes. He has a 60 pack-year smoking history. He has never used smokeless tobacco. He reports that drinks alcohol. He reports that he does not use illicit drugs.  Allergies:  Allergies   Allergen  Reactions   .  Lisinopril      REACTION: COUGH   (Not in a hospital admission)  No results found for this or any previous visit (from the past 48 hour(s)).  No results found.  ROS  Blood pressure 115/71, pulse 54, temperature 98 F (36.7 C), temperature source Oral, height 5\' 5"  (1.651 m), weight 192 lb 2 oz (87.147 kg).  Physical Exam  Gen: Alert, cooperative patient who appears stated age in no acute distress. Vital signs reviewed. Stigmata of Parkinson's present  HEENT: [place]/AT. EOMI, PERRL. MMM, tonsils  non-erythematous, non-edematous. External ears WNL, Bilateral TM's normal without retraction, redness or bulging.  Neck: No JVD, supple.  Cardiac: Regular rate and rhythm without murmur auscultated. Good S1/S2.  Pulm: Clear to auscultation bilaterally with good air movement. No wheezes or rales noted.  Abd: Soft/nondistended/nontender. Good bowel sounds throughout all four quadrants. No masses noted.  Ext: LUE with swelling along hand. TTP along 1st and 2nd metacarpals. RUE WNL. Bl LE's with +2 pitting edema to mid-shins.  Psych: Pleasant, not depressed or anxious appearing  Neuro: Oriented x 3. Immediate recall 3/3, after 5 minutes 0/3. Pill-rolling tremor at rest along with head tremor. Intention tremor noted with movement. Wide shuffling gait. Unstable on his feet   Assessment/Plan  1. Falls and altered mental status:  - CT head to rule out subdural hematoma. Low liklihood but want to rule out.  - I wonder if he has Parkinson's. Seen at Neuro about 8 months ago, diagnosed with essential tremor at that time.  - I can see images of prior MRI without read. No evidence of hydrocephalus.  - xray of Right hand to assess for any fracture   2. Gait instability:  -  as above.  - PT/OT for assessment and possible placement  Please see resident note for further chronic medical conditions. Continue chronic meds for now.   Brendan [name],Brendan [name]  06/26/2012, 12:01 PM

## 2012-06-27 NOTE — Progress Notes (Signed)
FMTS Attending Admission Note: Brendan [name] I  have seen and examined this patient, reviewed their chart. I have discussed this patient with the resident. I agree with the resident's findings, assessment .  Patient seen this morning,feeling better,denies any pain,no weakness.His daughter was by his bed side,both patient and his daughter feels he should go home with HHA rather than discharging to SNF. Of note is his medication s/e as a possible cause of his symptoms especially Primidone. May taper primadone off rather than abrupt d/c as he can have withdrawal seizures.F/U with his neurology to manage his tremor and to consider final d/c of primidone.

## 2012-06-28 NOTE — Discharge Summary (Signed)
FMTS Attending Admission Note: Leonia Heatherly,MD I  have seen and examined this patient, reviewed their chart. I have discussed this patient with the resident. I agree with the resident's findings, assessment and care plan.  

## 2012-07-01 ENCOUNTER — Ambulatory Visit: Payer: Medicare Other | Admitting: Emergency Medicine

## 2012-07-01 ENCOUNTER — Ambulatory Visit (INDEPENDENT_AMBULATORY_CARE_PROVIDER_SITE_OTHER): Payer: Medicare Other | Admitting: Emergency Medicine

## 2012-07-01 VITALS — BP 126/82 | HR 56 | Ht 64.0 in | Wt 194.0 lb

## 2012-07-01 DIAGNOSIS — F419 Anxiety disorder, unspecified: Secondary | ICD-10-CM

## 2012-07-01 DIAGNOSIS — G250 Essential tremor: Secondary | ICD-10-CM

## 2012-07-01 DIAGNOSIS — G252 Other specified forms of tremor: Secondary | ICD-10-CM

## 2012-07-01 DIAGNOSIS — R413 Other amnesia: Secondary | ICD-10-CM | POA: Insufficient documentation

## 2012-07-01 DIAGNOSIS — R269 Unspecified abnormalities of gait and mobility: Secondary | ICD-10-CM

## 2012-07-01 DIAGNOSIS — F411 Generalized anxiety disorder: Secondary | ICD-10-CM

## 2012-07-01 MED ORDER — ALPRAZOLAM 0.5 MG PO TABS: 0.5000 mg | ORAL_TABLET | Freq: Every day | ORAL | Status: DC | PRN

## 2012-07-01 MED ORDER — PRIMIDONE 250 MG PO TABS: 125.0000 mg | ORAL_TABLET | Freq: Two times a day (BID) | ORAL | Status: DC

## 2012-07-01 NOTE — Assessment & Plan Note (Signed)
Will taper primidone given his gait instability.  Down to 125mg  BID today.  Will follow up after he has seen neurology.  May taper more depending on his gait.

## 2012-07-01 NOTE — Assessment & Plan Note (Addendum)
Recently hospitalized for falls related to this.  Broad based gait, no cogwheel rigidity, resting tremor in left hand and head.  Possible Parkinson's but has been evaluated by neurology for this in the past and was told he had essential tremor only.  Primidone can cause ataxia.  Will continue to taper primidone to 125mg  BID as he has not noticed worsening of his tremor.  He has follow up with Dr. [name] in Neurology in June.  He and his daughter are trying to move that appt up given the gait and memory issues.  Instructed patient to use cane.  May need to consider walker if falls or near misses continue with cane.

## 2012-07-01 NOTE — Assessment & Plan Note (Signed)
Likely has early dementia given 0/3 on recall.  Parkinson's is a concern given tremor, but has been worked up for this previously.  I would like to start Aricept; however will get his neurologist's input prior to starting this.  Will send letter with question and update to Dr. [name].  Follow up 2-4 weeks after seeing Dr. [name] or in May.

## 2012-07-01 NOTE — Progress Notes (Signed)
  Subjective:    Patient ID: Brendan [name], male    DOB: [birth year], 72 y.o.   MRN: [id number]  HPI EXCELL NEYLAND is here for hospital f/u with his daughter.  1. Falls: He was recently admitted for falls.  Reports that his neurologist has told him he does not have Parkinson's.  He states that it is mostly when he first stands up that he feels unstable, but it gets better as he moves.  HHPT/OT have removed all throw rugs and cords.  No further falls, but has had some near misses.  States he has some canes at home, but is not using them.  No pre-syncopal symptoms.  It was thought that perhaps his primidone was causing some ataxia and was decreased.  2. Tremor: Continues to have a tremor, mostly of left arm and head.  Stable on decreased dose of primidone.  3. Memory concerns: His daughter is concerned about his memory, and he admits to having some difficulty with his memory as well.  I have reviewed and updated the following as appropriate: allergies and current medications SHx: former smoker  Review of Systems See HPI    Objective:   Physical Exam BP 126/82  Pulse 56  Ht 5\' 4"  (1.626 m)  Wt 194 lb (87.998 kg)  BMI 33.28 kg/m2 Gen: alert, cooperative, NAD, resting tremor of head and left arm HEENT: AT/[place], sclera white, MMM Neck: supple CV: RRR, no murmurs Pulm: CTAB, no wheezes or rales Gait: able to rise from chair without difficulty; broad-based gait; appears to stumble and catch himself frequently Neuro: tremor present in head and left arm; no cog-wheel rigidity Brief MMSE: 0/3 on recall; oriented to year only, unable to state what building this is; 5/5 on world      Assessment & Plan:

## 2012-07-01 NOTE — Patient Instructions (Addendum)
It was nice to see you! We have a couple things going on right now. We are going to cut back your primidone to 125mg  twice a day.  You can the cut the pills you have in half and I will send in a new prescription at the 125mg  strength. I will send a letter to Dr. [name] with what we discussed today.  I would like to start you an a medicine called Aricept for your memory, but I want you to discuss it with Dr. [name] as well. Follow up with me 2-4 weeks after you see Dr. [name].

## 2012-07-01 NOTE — Assessment & Plan Note (Signed)
Was previously on Ativan for anxiety.  Stopped this due to wife taking the medication.  Anxiety is high right now given his health concerns and his wife is in the hospital.  Will give xanax 0.5mg  #10 tablets to take daily as needed.

## 2012-07-08 ENCOUNTER — Ambulatory Visit (INDEPENDENT_AMBULATORY_CARE_PROVIDER_SITE_OTHER): Payer: Medicare Other | Admitting: Neurology

## 2012-07-08 ENCOUNTER — Encounter: Payer: Self-pay | Admitting: Neurology

## 2012-07-08 VITALS — BP 115/64 | HR 54 | Ht 66.0 in | Wt 190.0 lb

## 2012-07-08 DIAGNOSIS — D518 Other vitamin B12 deficiency anemias: Secondary | ICD-10-CM

## 2012-07-08 DIAGNOSIS — R413 Other amnesia: Secondary | ICD-10-CM

## 2012-07-08 DIAGNOSIS — G250 Essential tremor: Secondary | ICD-10-CM

## 2012-07-08 DIAGNOSIS — D519 Vitamin B12 deficiency anemia, unspecified: Secondary | ICD-10-CM

## 2012-07-08 MED ORDER — PRIMIDONE 250 MG PO TABS: ORAL_TABLET | ORAL | Status: DC

## 2012-07-08 NOTE — Progress Notes (Signed)
   Reason for visit: Tremor  Brendan [name] is an 72 y.o. male  History of present illness:  Brendan [name] is a 72 year old left-handed white male with a history of an essential tremor, and a history of a memory disorder. The patient indicates that the tremor was significantly suppressed with the use of prednisone taking 1-1/2 tablets twice daily of the 250 mg tablets. The patient has had some problems with falling, and the dose was cut back to 125 mg twice daily of the Mysoline. The balance did improve. The patient however, was also placed on Lyrica taking 100 mg 3 times daily. The balance was a problem with this medication as well. The patient has been getting some physical and occupational therapy in the home. The patient has a cane for ambulation, but he does not use this regularly. The last fall was about 2-3 weeks ago. The patient returns for an evaluation. The patient indicates that the memory remains a problem, but it has not worsened over time.    ROS:  Out of a complete 14 system review of symptoms, the patient complains only of the following symptoms, and all other reviewed systems are negative.  Swelling in legs Hearing loss Cough Incontinence of bladder Easy bruising Easy bleeding Jaw pain Joint swelling Achy muscles Runny nose Memory loss Confusion Weakness Tremor Anxiety Insomnia Decreased appetite Restless legs   Blood pressure 115/64, pulse 54, height 5\' 6"  (1.676 m), weight 190 lb (86.183 kg).  Physical Exam  General: The patient is alert and cooperative at the time of the examination.  Skin: 2+ edema is noted in ankles bilaterally.   Neurologic Exam  Mental status: The mental status examination reveals a total score of 24/28. The patient is able to name 8 animals in 60 seconds.   Cranial nerves: Facial symmetry is present. Speech is normal, no aphasia or dysarthria is noted. Extraocular movements are full. Visual fields are full. A head and neck  tremor is noted.  Motor: The patient has good strength in all 4 extremities.  Coordination: The patient has good finger-nose-finger and heel-to-shin bilaterally. The patient has significant tremor involving the upper extremities with finger-nose-finger.  Gait and station: The patient has a slightly wide based gait. Tandem gait is poor. Romberg is negative. No drift is seen.  Reflexes: Deep tendon reflexes are symmetric.   Assessment/Plan:  One. Essential tremor  2. Memory disturbance  The memory issues will need to be followed over time, and if it appears to be progressive, Aricept will be started. The patient will be increased slightly on the primidone dose, taking 125 mg in the morning, and 250 mg in the evening. The patient will followup through this office in 4 or 5 months. The use of Lyrica or gabapentin may also help the tremor.   Marlan Palau MD 07/08/2012 10:34 AM

## 2012-07-08 NOTE — Patient Instructions (Signed)
  Take the primidone one half tablet in the morning and one full tablet in the evening.

## 2012-07-10 ENCOUNTER — Ambulatory Visit (INDEPENDENT_AMBULATORY_CARE_PROVIDER_SITE_OTHER): Payer: Medicare Other | Admitting: Sports Medicine

## 2012-07-10 VITALS — BP 110/60 | Ht 67.0 in | Wt 198.0 lb

## 2012-07-10 DIAGNOSIS — G589 Mononeuropathy, unspecified: Secondary | ICD-10-CM

## 2012-07-10 DIAGNOSIS — IMO0002 Reserved for concepts with insufficient information to code with codable children: Secondary | ICD-10-CM

## 2012-07-10 NOTE — Progress Notes (Addendum)
  Subjective:    Patient ID: Brendan [name], male    DOB: [birth year], 72 y.o.   MRN: [id number]  HPI [name] comes in today for followup. Since his last office visit with me he has seen his neurologist, Dr. [name]. He has a history of an essential tremor as well some recent memory problems. I am following him primarily for his left hand. The pain from his complex regional pain syndrome continues to improve. He continues to see Dr. Yolanda Bonine and receives periodic nerve blocks. He has been going to physical therapy but his wife tells me that the therapist will soon be coming out to the house. [place] last saw Dr. Yolanda Bonine a few days ago. He is now on Lyrica 100 mg daily and Dr. Yolanda Bonine has also started him on Neurontin which he is to titrate up to 900 mg over the next few days. Dr. Yolanda Bonine also recommended that he use Ultram only when necessary and not scheduled as we had discussed previously.    Review of Systems     Objective:   Physical Exam Well-developed, well-nourished. No acute distress. Essential tremor noted.  Right hand: Mild diffuse swelling. No erythema. Patient still lacks some mobility at the MCP and PIP joints but it does appear to be improved from his last visit. Good radial and ulnar pulses.       Assessment & Plan:  1. Right hand complex regional pain syndrome 2. Essential tremor 3. Memory problem  Patient will continue with treatment per Dr. Gerhard Munch instructions; his next appointment is on April 21st. I once again explained to Brendan [name] the importance of physical therapy for his hand predicament. I will continue to see him every month or so. Continue to followup with Dr. [name] for his ongoing neurological problems.

## 2012-08-07 ENCOUNTER — Other Ambulatory Visit (HOSPITAL_COMMUNITY): Payer: Self-pay | Admitting: Cardiology

## 2012-08-07 DIAGNOSIS — I255 Ischemic cardiomyopathy: Secondary | ICD-10-CM

## 2012-08-07 DIAGNOSIS — R609 Edema, unspecified: Secondary | ICD-10-CM

## 2012-08-07 DIAGNOSIS — R0601 Orthopnea: Secondary | ICD-10-CM

## 2012-08-08 ENCOUNTER — Ambulatory Visit: Payer: Medicare Other | Admitting: Sports Medicine

## 2012-08-12 ENCOUNTER — Telehealth: Payer: Self-pay | Admitting: *Deleted

## 2012-08-12 ENCOUNTER — Ambulatory Visit (INDEPENDENT_AMBULATORY_CARE_PROVIDER_SITE_OTHER): Payer: Medicare Other | Admitting: Sports Medicine

## 2012-08-12 VITALS — BP 116/67 | Ht 67.0 in | Wt 198.0 lb

## 2012-08-12 DIAGNOSIS — IMO0002 Reserved for concepts with insufficient information to code with codable children: Secondary | ICD-10-CM

## 2012-08-12 DIAGNOSIS — G589 Mononeuropathy, unspecified: Secondary | ICD-10-CM

## 2012-08-12 MED ORDER — TRAMADOL HCL 50 MG PO TABS: 50.0000 mg | ORAL_TABLET | Freq: Three times a day (TID) | ORAL | Status: DC | PRN

## 2012-08-12 NOTE — Telephone Encounter (Signed)
Message copied by Jacki Cones C on Mon Aug 12, 2012  3:07 PM ------      Message from: Reino Bellis R      Created: Mon Aug 12, 2012  1:17 PM      Regarding: FW: Needs Tramadol Refilled       Ok to refill but change instructions to TID PRN. #[id number] with one RF            ----- Message -----         From: Claiborne Billings         Sent: 08/12/2012   1:01 PM           To: Mora Bellman, RN, Ralene Cork, DO      Subject: Needs Tramadol Refilled                                  Pt. Forgot to ask for refill during visit.  Pharmacy is [facility]        ------

## 2012-08-12 NOTE — Telephone Encounter (Signed)
Angela from OT @ [facility] called, this week is his last week of OT.  Marylene Land would like to extend this for 2 more weeks 2 times weekly.  Message forwarded to Dr. Elwyn Reach for ok. [place], Maryjo Rochester

## 2012-08-12 NOTE — Progress Notes (Signed)
  Subjective:    Patient ID: Brendan [name], male    DOB: Jan 08, 1941, 72 y.o.   MRN: [id number]  HPI  Dj comes in today for followup on his right hand complex regional pain syndrome. He remains under the care of Dr. Yolanda Bonine. Unfortunately, patient has had to cancel his most recent appointment due to some cardiac problems. In fact, he is set to undergo an echocardiogram tomorrow. His treating cardiologist is Dr. Herbie Baltimore. In regards to his right hand, I do think he is making slow but steady progress. He is still frustrated by the fact that he cannot make a complete fist. He is wearing his compression glove. Physical therapy is continuing to come to the house and work with him but it sounds like he has only a limited number of visits left. He is accompanied today by his wife.     Review of Systems     Objective:   Physical Exam Well-developed, well-nourished. No acute distress. He seems to be in good spirits  Right hand: There is some mild swelling mainly of the fingers. No significant swelling in the dorsum of his hand. He still lacks the ability to make a complete fist with limited range of motion both at the MCP and PIP joints. No tenderness to palpation. No erythema. Good radial and ulnar pulses.       Assessment & Plan:  1. Complex regional pain syndrome of the right hand  Although Terion is somewhat frustrated with his slow recovery, I've tried to encourage him that he is in fact making steady progress. I've also explained the importance of continuing to followup with Dr. Yolanda Bonine per Dr. Cherlyn Labella discretion. I will continue to follow his progress peripherally. He and his wife would like to return to the office in 4-6 weeks. In the meantime, he'll continue with his cardiac workup as well.

## 2012-08-13 ENCOUNTER — Ambulatory Visit (HOSPITAL_COMMUNITY)
Admission: RE | Admit: 2012-08-13 | Discharge: 2012-08-13 | Disposition: A | Discharge status: Home / Self Care | Payer: Medicare Other | Source: Ambulatory Visit | Attending: Cardiovascular Disease | Admitting: Cardiovascular Disease

## 2012-08-13 DIAGNOSIS — I2589 Other forms of chronic ischemic heart disease: Secondary | ICD-10-CM | POA: Insufficient documentation

## 2012-08-13 DIAGNOSIS — R0601 Orthopnea: Secondary | ICD-10-CM | POA: Insufficient documentation

## 2012-08-13 DIAGNOSIS — I255 Ischemic cardiomyopathy: Secondary | ICD-10-CM

## 2012-08-13 DIAGNOSIS — R609 Edema, unspecified: Secondary | ICD-10-CM

## 2012-08-13 NOTE — Telephone Encounter (Signed)
Placed order for 2 weeks of additional occupational therapy.

## 2012-08-13 NOTE — Progress Notes (Signed)
2D Echo Performed 08/13/2012    Safa Derner, RCS  

## 2012-08-13 NOTE — Telephone Encounter (Signed)
Left message for Brendan [name].  

## 2012-08-13 NOTE — Progress Notes (Signed)
Venous duplex doppler of both lower extremities was completed. Kourosh Jablonsky RVT 

## 2012-09-03 ENCOUNTER — Telehealth: Payer: Self-pay | Admitting: *Deleted

## 2012-09-03 NOTE — Telephone Encounter (Signed)
Result given concerning LEV doppler. F/u schedule June 2014

## 2012-09-23 ENCOUNTER — Encounter: Payer: Self-pay | Admitting: Sports Medicine

## 2012-09-23 ENCOUNTER — Ambulatory Visit (INDEPENDENT_AMBULATORY_CARE_PROVIDER_SITE_OTHER): Payer: Medicare Other | Admitting: Sports Medicine

## 2012-09-23 VITALS — BP 134/77 | HR 58 | Ht 67.0 in | Wt 198.0 lb

## 2012-09-23 DIAGNOSIS — G90519 Complex regional pain syndrome I of unspecified upper limb: Secondary | ICD-10-CM

## 2012-09-23 DIAGNOSIS — G90511 Complex regional pain syndrome I of right upper limb: Secondary | ICD-10-CM

## 2012-09-23 MED ORDER — TRAMADOL HCL 50 MG PO TABS: 50.0000 mg | ORAL_TABLET | Freq: Three times a day (TID) | ORAL | Status: DC | PRN

## 2012-09-23 NOTE — Progress Notes (Signed)
  Subjective:    Patient ID: Brendan [name], male    DOB: 1941-02-26, 71 y.o.   MRN: [id number]  HPI Brendan [name] comes in today for followup on his right hand complex regional pain syndrome. His last office visit with Dr. Yolanda Bonine was on May 8th. He received his final stellate ganglion block at that visit. Brendan [name] has started to wean down off of his Neurontin. He is now down to taking 300 mg twice a day and would like to try to get down to once a day. He needs a refill on his Ultram which he is taking as needed. Overall, he is in good spirits and seems to be improving.    Review of Systems     Objective:   Physical Exam Well-developed, well-nourished. No acute distress  Right hand: Normal skin color, normal temperature. Range of motion shows him to be able to approximate his distal fingers to the proximal palmar crease. There is no significant soft tissue swelling. Good radial and ulnar pulses.       Assessment & Plan:  1. Improving right hand pain secondary to complex regional pain syndrome  Per Dr. Cherlyn Labella notes, he would like to see Reis in followup in 2 months. I will refill his Ultram and he will continue to take it on a prn basis. He will continue to wean off of the Neurontin under the guidance of Dr. Yolanda Bonine. At this time, I will remain available to see the patient when necessary.

## 2012-09-30 ENCOUNTER — Ambulatory Visit: Payer: Medicare Other | Admitting: Emergency Medicine

## 2012-10-07 ENCOUNTER — Other Ambulatory Visit: Payer: Self-pay | Admitting: *Deleted

## 2012-10-07 MED ORDER — OMEPRAZOLE 20 MG PO CPDR: 20.0000 mg | DELAYED_RELEASE_CAPSULE | Freq: Every day | ORAL | Status: DC | PRN

## 2012-11-11 ENCOUNTER — Other Ambulatory Visit: Payer: Self-pay | Admitting: Cardiology

## 2012-11-11 NOTE — Telephone Encounter (Signed)
Pt stopped in and stated that he called the pharmacy to get his Lisonopril filled and they told him that they will not fill it because it was prescribed by Dr. Clarene Duke and he is retired. The patient is completely out of the medication. He saw Dr. Herbie Baltimore back in [place], but he didn't need a refill at that time. He is due back to see Dr. Herbie Baltimore in October. Can you please call it in to [facility] @ [phone].

## 2012-11-12 MED ORDER — LISINOPRIL 10 MG PO TABS: 10.0000 mg | ORAL_TABLET | Freq: Every day | ORAL | Status: DC

## 2012-11-12 NOTE — Telephone Encounter (Signed)
Refill(s) sent to pharmacy.  Returned call and spoke w/ Royal Piedra.  Asked her to inform pt that refill sent to pharmacy.  Agreed.

## 2012-11-27 ENCOUNTER — Encounter: Payer: Self-pay | Admitting: Neurology

## 2012-11-27 ENCOUNTER — Ambulatory Visit (INDEPENDENT_AMBULATORY_CARE_PROVIDER_SITE_OTHER): Payer: Medicare Other | Admitting: Neurology

## 2012-11-27 VITALS — BP 124/75 | HR 60 | Wt 191.0 lb

## 2012-11-27 DIAGNOSIS — R269 Unspecified abnormalities of gait and mobility: Secondary | ICD-10-CM

## 2012-11-27 DIAGNOSIS — Z5181 Encounter for therapeutic drug level monitoring: Secondary | ICD-10-CM

## 2012-11-27 DIAGNOSIS — R413 Other amnesia: Secondary | ICD-10-CM

## 2012-11-27 DIAGNOSIS — G252 Other specified forms of tremor: Secondary | ICD-10-CM

## 2012-11-27 DIAGNOSIS — G250 Essential tremor: Secondary | ICD-10-CM

## 2012-11-27 MED ORDER — PRIMIDONE 250 MG PO TABS: 250.0000 mg | ORAL_TABLET | Freq: Two times a day (BID) | ORAL | Status: DC

## 2012-11-27 NOTE — Progress Notes (Signed)
Reason for visit: Tremor  Brendan [name] is an 72 y.o. male  History of present illness:  Brendan [name] is a 72 year old left-handed white male with a history of an essential tremor, and a history of a mild memory disorder. The patient has a history of some difficulty with gait imbalance, and he had a MRI study done in December 2013 that showed evidence of multilevel spinal stenosis at the C4-5 and C5-6 levels. The patient does have some flattening of the spinal cord at these levels. The patient indicates that he does have some urinary urgency. The patient has not had any recent falls. The patient does have a cane, but he usually does not use it. The patient indicates that Mysoline does help his tremor. The patient is taking gabapentin if needed for pain. The patient takes this only occasionally. Higher doses of Lyrica and gabapentin worsened his gait. The patient reports no troubles with sleepiness on Primidone. The patient went up on the dose taking 250 mg twice daily. The patient returns for an evaluation. The patient denies any significant neck discomfort.  Past Medical History  Diagnosis Date  . Angina pectoris syndrome   . Hypertension   . Hyperlipidemia   . Glaucoma   . Coronary atherosclerosis of artery bypass graft   . Hiatal hernia     GI: Dr Evette Cristal  . Hx of cardiac cath 2002    LAD disease, EF 60%.  Cardiologist is Dr Clarene Duke.  [name] Shortness of breath   . Gastroesophageal reflux disease   . Peripheral neuropathy     possible peripheral neuropathy  . Memory loss     Mild  . Gait disorder   . Benign enlargement of prostate   . Anxiety disorder   . Obesity   . Tremor, essential     On Primidone  . Benign essential tremor   . Cervical spinal stenosis     Past Surgical History  Procedure Laterality Date  . Coronary artery bypass graft  06/2002    LIMA-LAD, SVG-D1, SVG-OM  . Cardiovascular stress test  03/2009    By Dr Fredrich Birks, Negative  . Shoulder orthoscopic surgery    06/2003  . Knee surgery Left   . Wrist ganglion cyst Left     Family History  Problem Relation Age of Onset  . Heart disease Mother   . Heart disease Brother   . Tremor Paternal Uncle   . Heart disease Brother     Social history:  reports that he quit smoking about 44 years ago. His smoking use included Cigarettes. He has a 60 pack-year smoking history. He has never used smokeless tobacco. He reports that  drinks alcohol. He reports that he does not use illicit drugs.  Allergies:  Allergies  Allergen Reactions  . Lisinopril Cough    Medications:  Current Outpatient Prescriptions on File Prior to Visit  Medication Sig Dispense Refill  . ALPRAZolam (XANAX) 0.5 MG tablet Take 1 tablet (0.5 mg total) by mouth daily as needed for anxiety.  10 tablet  0  . aspirin (LONGS ADULT LOW STRENGTH ASA) 81 MG EC tablet Take 81 mg by mouth daily.        . fish oil-omega-3 fatty acids 1000 MG capsule Take 1 g by mouth 2 (two) times daily.      [name] gabapentin (NEURONTIN) 300 MG capsule Take 300 mg by mouth 2 (two) times daily.      [name] lisinopril (PRINIVIL,ZESTRIL) 10 MG tablet Take 1 tablet (  10 mg total) by mouth daily.  30 tablet  3  . nitroGLYCERIN (NITROSTAT) 0.4 MG SL tablet Place 1 tablet (0.4 mg total) under the tongue every 5 (five) minutes as needed for chest pain. call 911 if chest pain not better  30 tablet  3  . omeprazole (PRILOSEC) 20 MG capsule Take 1 capsule (20 mg total) by mouth daily as needed (acid reflux).  30 capsule  3  . propranolol (INDERAL) 80 MG tablet Take 1 tablet (80 mg total) by mouth 2 (two) times daily.  180 tablet  4  . simvastatin (ZOCOR) 80 MG tablet Take 1 tablet (80 mg total) by mouth at bedtime.  90 tablet  4  . traMADol (ULTRAM) 50 MG tablet Take 1 tablet (50 mg total) by mouth 3 (three) times daily as needed for pain.  90 tablet  1   Current Facility-Administered Medications on File Prior to Visit  Medication Dose Route Frequency Provider Last Rate Last Dose  .  pneumococcal 23 valent vaccine (PNU-IMMUNE) injection 0.5 mL  0.5 mL Intramuscular Once Phebe Colla, MD        ROS:  Out of a complete 14 system review of symptoms, the patient complains only of the following symptoms, and all other reviewed systems are negative.  Fatigue Swelling in the legs Hearing loss Cough Diarrhea Feeling hot Runny nose Pain in the hands, legs, feet Tremor Numbness and weakness of the right hand Memory disturbance Change in appetite, disinterest in activities Insomnia, sleepiness   Blood pressure 124/75, pulse 60, weight 191 lb (86.637 kg).  Physical Exam  General: The patient is alert and cooperative at the time of the examination.  Skin: No significant peripheral edema is noted.   Neurologic Exam  Mental status: Mini-Mental status examination done today shows a total score 20/30.    Cranial nerves: Facial symmetry is present. Speech is normal, no aphasia or dysarthria is noted. Extraocular movements are full. Visual fields are full.A head and neck tremor is noted.  Motor: The patient has good strength in all 4 extremities.  Coordination: The patient has good finger-nose-finger and heel-to-shin bilaterally.The patient has an intention tremor that appears to be more prominent with the left hand in the right. The patient also has a component of a resting tremor bilaterally.  Gait and station: The patient has a normal gait.  with ambulation, the patient has good arm swing bilaterally. Tandem gait is slightly unsteady. Romberg is negative. No drift is seen.  Reflexes: Deep tendon reflexes are symmetric.    MRI cervical spine report:  IMPRESSION:  1. Negative for syrinx or other abnormality of the cervical cord.  2. Spondylosis at C5-6 results in some flattening of the ventral  cord in conjunction with short pedicle length and right much worse  than left foraminal narrowing with encroachment on the exiting C6  roots, worse on the right.  2.  Disc bulge and superimposed central protrusion at C3-4 result  in deformity the ventral cord in conjunction with congenitally  narrow central canal. Moderate to moderately severe bilateral  foraminal narrowing is present at this level.  3. Mild flattening of the ventral cord at C4-5 where there is left  worse than right foraminal narrowing due to uncovertebral disease.   Assessment/Plan:  One. Benign essential tremor  2. Gait disorder  3. Cervical spinal stenosis  4. Memory disturbance  The patient has a mild memory disturbance that appears to be relatively stable. We will continue to follow this.  The patient will be sent for blood work today. The patient will try going back on gabapentin on a regular basis. This may help his pain and his tremor as well. If the blood work allows, we may go up on the Mysoline in the future. The patient followup in 6 months. The patient does have spinal stenosis, and he may require a neurosurgical referral in the future.  Marlan Palau MD 11/27/2012 7:26 PM  [facility] [address] [place], [place] [address]  Phone [phone] Fax [phone]

## 2012-11-28 ENCOUNTER — Telehealth: Payer: Self-pay | Admitting: Neurology

## 2012-11-28 LAB — CBC WITH DIFFERENTIAL
Basophils Absolute: 0 10*3/uL (ref 0.0–0.2)
Basos: 0 % (ref 0–3)
Eos: 2 % (ref 0–5)
Eosinophils Absolute: 0.1 10*3/uL (ref 0.0–0.4)
HCT: 40.3 % (ref 37.5–51.0)
Hemoglobin: 14 g/dL (ref 12.6–17.7)
Immature Grans (Abs): 0 10*3/uL (ref 0.0–0.1)
Immature Granulocytes: 0 % (ref 0–2)
Lymphocytes Absolute: 1.6 10*3/uL (ref 0.7–3.1)
Lymphs: 29 % (ref 14–46)
MCH: 33.6 pg — ABNORMAL HIGH (ref 26.6–33.0)
MCHC: 34.7 g/dL (ref 31.5–35.7)
MCV: 97 fL (ref 79–97)
Monocytes Absolute: 0.3 10*3/uL (ref 0.1–0.9)
Monocytes: 6 % (ref 4–12)
Neutrophils Absolute: 3.6 10*3/uL (ref 1.4–7.0)
Neutrophils Relative %: 63 % (ref 40–74)
Platelets: 154 10*3/uL (ref 150–379)
RBC: 4.17 x10E6/uL (ref 4.14–5.80)
RDW: 14.2 % (ref 12.3–15.4)
WBC: 5.6 10*3/uL (ref 3.4–10.8)

## 2012-11-28 LAB — COMPREHENSIVE METABOLIC PANEL
ALT: 13 IU/L (ref 0–44)
AST: 15 IU/L (ref 0–40)
Albumin/Globulin Ratio: 2 (ref 1.1–2.5)
Albumin: 4.7 g/dL (ref 3.5–4.8)
Alkaline Phosphatase: 96 IU/L (ref 39–117)
BUN/Creatinine Ratio: 12 (ref 10–22)
BUN: 13 mg/dL (ref 8–27)
CO2: 23 mmol/L (ref 18–29)
Calcium: 9.3 mg/dL (ref 8.6–10.2)
Chloride: 103 mmol/L (ref 97–108)
Creatinine, Ser: 1.13 mg/dL (ref 0.76–1.27)
GFR calc Af Amer: 76 mL/min/{1.73_m2} (ref >59)
GFR calc non Af Amer: 65 mL/min/{1.73_m2} (ref >59)
Globulin, Total: 2.3 g/dL (ref 1.5–4.5)
Glucose: 98 mg/dL (ref 65–99)
Potassium: 5 mmol/L (ref 3.5–5.2)
Sodium: 142 mmol/L (ref 134–144)
Total Bilirubin: 0.5 mg/dL (ref 0.0–1.2)
Total Protein: 7 g/dL (ref 6.0–8.5)

## 2012-11-28 LAB — PRIMIDONE, SERUM
Phenobarbital Lvl: 24 ug/mL (ref 15–40)
Primidone Lvl: 13.9 ug/mL (ref 5.0–12.0)

## 2012-11-28 NOTE — Telephone Encounter (Signed)
I called patient. The blood work is unremarkable. The primidone level is slightly over the upper end of the therapeutic range, the patient is tolerating the medication well, no adjustments to the dosing. We likely can go higher on the dosing levels.

## 2012-12-06 ENCOUNTER — Encounter: Payer: Self-pay | Admitting: Emergency Medicine

## 2012-12-06 ENCOUNTER — Ambulatory Visit (INDEPENDENT_AMBULATORY_CARE_PROVIDER_SITE_OTHER): Payer: Medicare Other | Admitting: Emergency Medicine

## 2012-12-06 VITALS — BP 127/78 | HR 54 | Ht 67.0 in | Wt 194.0 lb

## 2012-12-06 DIAGNOSIS — IMO0002 Reserved for concepts with insufficient information to code with codable children: Secondary | ICD-10-CM

## 2012-12-06 DIAGNOSIS — G250 Essential tremor: Secondary | ICD-10-CM

## 2012-12-06 DIAGNOSIS — G589 Mononeuropathy, unspecified: Secondary | ICD-10-CM

## 2012-12-06 DIAGNOSIS — E782 Mixed hyperlipidemia: Secondary | ICD-10-CM

## 2012-12-06 DIAGNOSIS — I10 Essential (primary) hypertension: Secondary | ICD-10-CM

## 2012-12-06 MED ORDER — GABAPENTIN 300 MG PO CAPS: 300.0000 mg | ORAL_CAPSULE | Freq: Two times a day (BID) | ORAL | Status: DC

## 2012-12-06 MED ORDER — TRAMADOL HCL 50 MG PO TABS: 50.0000 mg | ORAL_TABLET | Freq: Three times a day (TID) | ORAL | Status: DC | PRN

## 2012-12-06 NOTE — Assessment & Plan Note (Signed)
Well controlled. Continue lisinopril and propranolol.

## 2012-12-06 NOTE — Assessment & Plan Note (Signed)
Well controlled by labs in 08/2011. Under new guidelines no need to recheck numbers. Continue simvastatin 80mg  daily.

## 2012-12-06 NOTE — Progress Notes (Signed)
  Subjective:    Patient ID: Brendan [name], male    DOB: 03-26-1941, 72 y.o.   MRN: [id number]  HPI Brendan [name] is here for f/u of medical issues.  Hypertension Well controlled: yes Compliant with medication: yes Side effects from medication: no Check BP at home: no  Chest pain: no Palpitations: no Vision changes: no Leg edema: yes - intermittent Dizziness: no  Hyperlipidemia Well controlled: yes Compliant with medication: yes Side effects from medication: no  Essential tremor Seen by Dr. [name] earlier this week.  Doing well.  CRPS Doing okay.  He had weaned off tramadol and gabapentin at one point, but restarted it 2 weeks ago due to pain and discomfort in his right hand.  He also reports some "nerve pain" in his right shin occasionally.  He has completed a series of 10 nerve blocks.  I have reviewed and updated the following as appropriate: allergies, current medications, past surgical history and problem list SHx: non smoker  Review of Systems See HPI    Objective:   Physical Exam BP 127/78  Pulse 54  Ht 5\' 7"  (1.702 m)  Wt 194 lb (87.998 kg)  BMI 30.38 kg/m2 Gen: alert, cooperative, NAD, slight improvement of head and arm resting tremor HEENT: AT/[place], sclera white, MMM Neck: supple CV: RRR, no murmurs Pulm: CTAB, no wheezes or rales Ext: no edema     Assessment & Plan:

## 2012-12-06 NOTE — Assessment & Plan Note (Signed)
Doing okay. Continue gabapentin 300mg  BID and tramadol 50mg  prn.

## 2012-12-06 NOTE — Patient Instructions (Addendum)
It was nice to see you!  Everything looks good! No changes to your medicines today.  Follow up in 6 months or sooner if needed.

## 2012-12-06 NOTE — Assessment & Plan Note (Signed)
Stable to slightly improved. Continue propranolol and primidone. Recent lab work reviewed. Appreciate Dr. [name] in Neurology's involvement.

## 2013-01-28 ENCOUNTER — Ambulatory Visit (INDEPENDENT_AMBULATORY_CARE_PROVIDER_SITE_OTHER): Payer: Medicare Other | Admitting: Cardiology

## 2013-01-28 ENCOUNTER — Encounter: Payer: Self-pay | Admitting: Cardiology

## 2013-01-28 VITALS — BP 112/70 | HR 52 | Ht 66.0 in | Wt 196.9 lb

## 2013-01-28 DIAGNOSIS — I25810 Atherosclerosis of coronary artery bypass graft(s) without angina pectoris: Secondary | ICD-10-CM

## 2013-01-28 DIAGNOSIS — I200 Unstable angina: Secondary | ICD-10-CM

## 2013-01-28 DIAGNOSIS — E669 Obesity, unspecified: Secondary | ICD-10-CM

## 2013-01-28 DIAGNOSIS — R609 Edema, unspecified: Secondary | ICD-10-CM

## 2013-01-28 DIAGNOSIS — I2510 Atherosclerotic heart disease of native coronary artery without angina pectoris: Secondary | ICD-10-CM

## 2013-01-28 DIAGNOSIS — Z951 Presence of aortocoronary bypass graft: Secondary | ICD-10-CM

## 2013-01-28 DIAGNOSIS — I10 Essential (primary) hypertension: Secondary | ICD-10-CM

## 2013-01-28 DIAGNOSIS — E785 Hyperlipidemia, unspecified: Secondary | ICD-10-CM

## 2013-01-28 MED ORDER — FUROSEMIDE 20 MG PO TABS: 20.0000 mg | ORAL_TABLET | Freq: Every day | ORAL | Status: DC

## 2013-01-28 MED ORDER — LISINOPRIL 10 MG PO TABS: 10.0000 mg | ORAL_TABLET | Freq: Every day | ORAL | Status: DC

## 2013-01-28 NOTE — Patient Instructions (Signed)
Restart lisinopril 10 mg once a day Lasix 20 mg (furosemide) take 1 tablet a day for 6 days then take 1 tablet every other day.  Your physician wants you to follow-up in 6 months DR harding.  You will receive a reminder letter in the mail two months in advance. If you don't receive a letter, please call our office to schedule the follow-up appointment.

## 2013-01-29 NOTE — Progress Notes (Signed)
PATIENT: Brendan [name] MRN: [id number]  DOB: March 29, 1941   DOV:01/30/2013 PCP: Randal Buba, MD  Clinic Note: Chief Complaint  Patient presents with  . ROV 6 months    Pain in both knees, occasional edema in ankles.    HPI: Brendan [name] is a 72 y.o. male with a PMH below who presents today for six-month followup of CAD. He was a former patient of Dr. Caprice Kluver, who I met noted his catheterization in June of 2013 that was done for worsening dyspnea on exertion and chest discomfort relieved with nitroglycerin. The symptoms were thought to be unstable angina. He has a history of CABG from back in 2004 with LIMA-LAD, SVG-diagonal, SVG to OM. In June of 2013, LIMA to LAD and SVG to OM were patent. SVG to diagonal occluded. The RCA the mild irregularities. EF is roughly 45% with apical inferior hypokinesis. It was thought that the culprit was probably the occluded vein graft to diagonal. Treated medically. In the interim, he has had a followup echocardiogram this showed an improved EF of 55-60% with no regional wall motion abnormalities and a relatively normal study.  Interval History: He returns today he relatively fine overall. He says that he initially stopped his lisinopril but is able take it again now and has not had any more cough.. He has not noted worsening dyspnea with exertion, dizziness disease not able to do a lot partly because of musculoskeletal issues (pain in his knees) and his tremor. He also has, myalgias and muscle aching throughout his legs and hips. Hecannot lie flat partly because of shortness of breath but partly also because of discomfort from musculoskeletal standpoint and GERD. He also notes that his edema is getting a little worse. His wrists and hands are released Bothering him as are his knees.  The remainder of Cardiovascular ROS: positive for - dyspnea on exertion, edema and orthopnea negative for - chest pain, irregular heartbeat, loss of consciousness, murmur,  palpitations, paroxysmal nocturnal dyspnea, rapid heart rate or shortness of breath: Additional cardiac review of systems: Lightheadedness - no, dizziness - occasionally, syncope/near-syncope - no; TIA/amaurosis fugax - no Melena - no, hematochezia no; hematuria - no; nosebleeds - no; claudication - no   Past Medical History  Diagnosis Date  . CAD in native artery 1997    Referred for CABG x3 in 2004 for LAD diagonal bifurcation lesion  . S/P CABG x 3 2004     LIMA-LAD, SVG to diagonal, SVG to OM  . Unstable angina pectoris  June 2013    Cardiac cath: occluded SVG-DI. Patent LIMA-LAD and SVG-OM. EF 45% with apical inferior HK.  . Coronary atherosclerosis of artery bypass graft June 2013    Occluded SVG-D1; Cardiologist Dr. Herbie Baltimore  . Hypertension   . Hyperlipidemia LDL goal <70   . Gastroesophageal reflux disease   . Peripheral neuropathy     possible peripheral neuropathy  . Memory loss     Mild  . Gait disorder   . Benign enlargement of prostate   . Anxiety disorder   . Obesity   . Tremor, essential     On Primidone  . Cervical spinal stenosis   . Glaucoma   . Hiatal hernia     GI: Dr Evette Cristal  . Complex regional pain syndrome of right upper extremity     Right wrist  . Reflex sympathetic dystrophy     Prior Cardiac Evaluation and Past Surgical History: Past Surgical History  Procedure Laterality Date  .  Coronary artery bypass graft  06/2002    LIMA-LAD, SVG-D1, SVG-OM  . Cardiovascular stress test  03/2009    By Dr Fredrich Birks, Negative  . Shoulder orthoscopic surgery   06/2003  . Knee surgery Left   . Wrist ganglion cyst Left   . Transthoracic echocardiogram  April 2014    EF 55-60%; no regional wall motion abnormalities. Relatively normal study normal diastolic parameters/abnormal relaxation    No Active Allergies  Current Outpatient Prescriptions  Medication Sig Dispense Refill  . aspirin (LONGS ADULT LOW STRENGTH ASA) 81 MG EC tablet Take 81 mg by mouth daily.         . fish oil-omega-3 fatty acids 1000 MG capsule Take 1 g by mouth 2 (two) times daily.      [name] gabapentin (NEURONTIN) 300 MG capsule Take 1 capsule (300 mg total) by mouth 2 (two) times daily.  180 capsule  3  . lisinopril (PRINIVIL,ZESTRIL) 10 MG tablet Take 1 tablet (10 mg total) by mouth daily.  30 tablet  11  . mupirocin ointment (BACTROBAN) 2 % Apply 1 application topically as needed.      . nitroGLYCERIN (NITROSTAT) 0.4 MG SL tablet Place 1 tablet (0.4 mg total) under the tongue every 5 (five) minutes as needed for chest pain. call 911 if chest pain not better  30 tablet  3  . omeprazole (PRILOSEC) 20 MG capsule Take 1 capsule (20 mg total) by mouth daily as needed (acid reflux).  30 capsule  3  . primidone (MYSOLINE) 250 MG tablet Take 1 tablet (250 mg total) by mouth 2 (two) times daily.      . propranolol (INDERAL) 80 MG tablet Take 1 tablet (80 mg total) by mouth 2 (two) times daily.  180 tablet  4  . simvastatin (ZOCOR) 80 MG tablet Take 1 tablet (80 mg total) by mouth at bedtime.  90 tablet  4  . traMADol (ULTRAM) 50 MG tablet Take 1 tablet (50 mg total) by mouth 3 (three) times daily as needed for pain.  90 tablet  2  . furosemide (LASIX) 20 MG tablet Take 1 tablet (20 mg total) by mouth daily.  30 tablet  11   Current Facility-Administered Medications  Medication Dose Route Frequency Provider Last Rate Last Dose  . pneumococcal 23 valent vaccine (PNU-IMMUNE) injection 0.5 mL  0.5 mL Intramuscular Once Charm Rings, MD        History   Social History Narrative   Married.  Wife is Royal Piedra.   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   Former smoker, quit 20 years ago. Does not drink alcohol.   Does not get routine exercise, walks sometimes with a cane.   ROS: A comprehensive Review of Systems - Negative except Pertinent symptoms noted above. He also notes some indigestion that he had when he was eating some peanut M&M's. As the only times and discomfort in his  chest. No significant bleeding issues.  PHYSICAL EXAM BP 112/70  Pulse 52  Ht 5\' 6"  (1.676 m)  Wt 196 lb 14.4 oz (89.313 kg)  BMI 31.8 kg/m2 -- represent a 6 pound weight gain since last visit General appearance: alert, cooperative, appears stated age, no distress and mildly obese; well-nourished, well-groomed. Answers questions appropriately. Neck: no adenopathy, no carotid bruit and no JVD Lungs: clear to auscultation bilaterally, normal percussion bilaterally and non-labored Heart: regular rate and rhythm, S1, S2 normal, no murmur, click, rub or gallop; nondisplaced PMI Abdomen: soft, non-tender; bowel sounds  normal; no masses,  no organomegaly;  Extremities: extremities normal, atraumatic, no cyanosis, and roughly 2+ right and 1+ left lower extremity edema  Pulses: 2+ and symmetric;  Skin: Mild venous stasis changes found. Neurologic: Alert, oriented, thought content appropriate; baseline essential tremor in hands arms and even the head. Cranial nerves: normal (II-XII grossly intact) he HEENT: [place]/AT, EOMI, MMM, anicteric sclera  [id number] today: Yes Rate:52 , Rhythm: Sinus bradycardia, poor anterior wave progression, otherwise normal ECG. No significant change;    Recent Labs: Labs from August reviewed, chemistry panel, CBC, but not lipids. Per PCP: Labs were last checked in April 2013, and appeared to be at goal. Not checked based on new guidelines.  ASSESSMENT / PLAN: Edema He has had a little of edema, this somewhat flexing. He had some orthopnea in the past but not really all that significant. His echo certainly would not suggest that he has a reason for this from a systolic dysfunction standpoint. There may be some mild diastolic dysfunction but not reported.  Plan: Lasix 20 mg daily for the first 6 days then every other day.  CAD in native artery He has baseline CAD with history of CABG. He is to remain grafts are patent LIMA-LAD and SVG-OM. I think the diagonal was  probably too small and did not take the graft. Graft flows but the diagonal lesion was probably significant enough that it also occluded. He does not seem to be having any significant symptoms at this point. He is currently on aspirin but not Plavix, ACE inhibitor plus Inderal as his beta blocker which is there for his neurologic condition. He is also on Zocor and 80 mg.  Unstable angina pectoris He hasn't really had any unstable symptoms since June of last year when he had his. Currently Stable on His Regimen As Is. He Has When Necessary Nitroglycerin Which He Is Not Using. Otherwise on Stable Regimen.  CORONARY ATHEROSCLEROSIS, ARTERY BYPASS GRAFT Stable, see above  HYPERTENSION, BENIGN Well-controlled. He seems to be doing okay with the ACE inhibitor back on board. I will refill that for him.  Hyperlipidemia LDL goal <70 At goal her last checked over a year ago. He is on high-dose of statin, and is noticing some symptoms that may be myalgia related. I think it's reasonable for did stop the simvastatin for a month to see if his symptoms improve. 80 mg of simvastatin is very high dose that can easily cause myalgias.  I do think it would not be unreasonable to recheck lipids At least once a year.  I actually would like to see an NMR panel checked to confirm that he truly is at goal. If he remains at goal my recommendation would be to reduce the simvastatin to 40 mg from 80 mg.   Obesity (BMI 30-39.9) I spent some time talking about the importance of dietary modification, especially with his limited mobility. We talked about low carbohydrate diet. Attempting to replicate some semblance of a DASH diet. I also think he may be a do some type of exercise, such as water aerobics.    Orders Placed This Encounter  Procedures  . EKG 12-Lead   Meds ordered this encounter  Medications  . furosemide (LASIX) 20 MG tablet    Sig: Take 1 tablet (20 mg total) by mouth daily.    Dispense:  30 tablet     Refill:  11  . lisinopril (PRINIVIL,ZESTRIL) 10 MG tablet    Sig: Take 1 tablet (10 mg total) by  mouth daily.    Dispense:  30 tablet    Refill:  11    Followup: 6 months  Bolton Canupp W. Herbie Baltimore, M.D., M.S. [facility] [address] [place], [place]  [address]  [phone] Pager # [phone]

## 2013-01-30 ENCOUNTER — Encounter: Payer: Self-pay | Admitting: Cardiology

## 2013-01-30 DIAGNOSIS — E669 Obesity, unspecified: Secondary | ICD-10-CM | POA: Insufficient documentation

## 2013-01-30 DIAGNOSIS — R600 Localized edema: Secondary | ICD-10-CM | POA: Insufficient documentation

## 2013-01-30 DIAGNOSIS — Z951 Presence of aortocoronary bypass graft: Secondary | ICD-10-CM | POA: Insufficient documentation

## 2013-01-30 DIAGNOSIS — E785 Hyperlipidemia, unspecified: Secondary | ICD-10-CM | POA: Insufficient documentation

## 2013-01-30 DIAGNOSIS — I2510 Atherosclerotic heart disease of native coronary artery without angina pectoris: Secondary | ICD-10-CM | POA: Insufficient documentation

## 2013-01-30 NOTE — Assessment & Plan Note (Signed)
I spent some time talking about the importance of dietary modification, especially with his limited mobility. We talked about low carbohydrate diet. Attempting to replicate some semblance of a DASH diet. I also think he may be a do some type of exercise, such as water aerobics.

## 2013-01-30 NOTE — Assessment & Plan Note (Signed)
He hasn't really had any unstable symptoms since June of last year when he had his. Currently Stable on His Regimen As Is. He Has When Necessary Nitroglycerin Which He Is Not Using. Otherwise on Stable Regimen.

## 2013-01-30 NOTE — Assessment & Plan Note (Signed)
·   Stable, see above

## 2013-01-30 NOTE — Assessment & Plan Note (Signed)
At goal her last checked over a year ago. He is on high-dose of statin, and is noticing some symptoms that may be myalgia related. I think it's reasonable for did stop the simvastatin for a month to see if his symptoms improve. 80 mg of simvastatin is very high dose that can easily cause myalgias.  I do think it would not be unreasonable to recheck lipids At least once a year.  I actually would like to see an NMR panel checked to confirm that he truly is at goal. If he remains at goal my recommendation would be to reduce the simvastatin to 40 mg from 80 mg.

## 2013-01-30 NOTE — Assessment & Plan Note (Signed)
He has had a little of edema, this somewhat flexing. He had some orthopnea in the past but not really all that significant. His echo certainly would not suggest that he has a reason for this from a systolic dysfunction standpoint. There may be some mild diastolic dysfunction but not reported.  Plan: Lasix 20 mg daily for the first 6 days then every other day.

## 2013-01-30 NOTE — Assessment & Plan Note (Signed)
Well-controlled. He seems to be doing okay with the ACE inhibitor back on board. I will refill that for him.

## 2013-01-30 NOTE — Assessment & Plan Note (Signed)
He has baseline CAD with history of CABG. He is to remain grafts are patent LIMA-LAD and SVG-OM. I think the diagonal was probably too small and did not take the graft. Graft flows but the diagonal lesion was probably significant enough that it also occluded. He does not seem to be having any significant symptoms at this point. He is currently on aspirin but not Plavix, ACE inhibitor plus Inderal as his beta blocker which is there for his neurologic condition. He is also on Zocor and 80 mg.

## 2013-01-31 ENCOUNTER — Ambulatory Visit (INDEPENDENT_AMBULATORY_CARE_PROVIDER_SITE_OTHER): Payer: Medicare Other | Admitting: *Deleted

## 2013-01-31 VITALS — Temp 97.4°F

## 2013-01-31 DIAGNOSIS — Z23 Encounter for immunization: Secondary | ICD-10-CM

## 2013-05-20 ENCOUNTER — Telehealth: Payer: Self-pay | Admitting: Emergency Medicine

## 2013-05-20 NOTE — Telephone Encounter (Signed)
Pt brought in forms to completed for his medicines to be filled by [facility]

## 2013-05-21 NOTE — Telephone Encounter (Signed)
I have completed the front portion of the form.  The patient needs to complete the back of the form before it can be mailed.  Form returned to [place].

## 2013-05-21 NOTE — Telephone Encounter (Signed)
Placed forns in Dr. Harrel LemonHonig's box.   Lynzee Lindquist, Darlyne RussianKristen L, CMA

## 2013-05-22 NOTE — Telephone Encounter (Signed)
Attempted several times to call pt.  Number is constantly busy.  Form placed up front.  Please tell patient if he calls back.  Thank you.   Alanda Colton, Darlyne RussianKristen L, CMA

## 2013-05-22 NOTE — Telephone Encounter (Signed)
Pt notified that form is up front for pick up.  Brendan [name], Darlyne RussianKristen L, CMA

## 2013-05-30 ENCOUNTER — Telehealth: Payer: Self-pay | Admitting: Emergency Medicine

## 2013-05-30 NOTE — Telephone Encounter (Signed)
Pt called and needs a refill on his Primidone sent in jw

## 2013-05-30 NOTE — Telephone Encounter (Signed)
I have sent in this prescription.

## 2013-06-17 ENCOUNTER — Ambulatory Visit: Payer: Medicare Other | Admitting: Neurology

## 2013-07-11 ENCOUNTER — Telehealth: Payer: Self-pay | Admitting: Critical Care Medicine

## 2013-07-11 DIAGNOSIS — J920 Pleural plaque with presence of asbestos: Secondary | ICD-10-CM

## 2013-07-11 NOTE — Telephone Encounter (Signed)
Per OV 04/2012: Return 1 year with repeat CT of chest, we will remind you of this as the date approaches with a phone call --  Dr. Delford FieldWright please advise if this is w/ or w/o contrast? thanks

## 2013-07-11 NOTE — Telephone Encounter (Signed)
Yes needs NON contrasted CT Chest.  Evaluate for mesothelioma , hx o pleural plaques, asbestos

## 2013-07-11 NOTE — Telephone Encounter (Signed)
Order has been placed. PCC's once CT is scheduled please let us know and then we can schedule OV with PW thanks

## 2013-07-15 NOTE — Telephone Encounter (Signed)
CT Chest scheduled for Wed 07/16/13 at 1:[facility]. Pt to arrive at 1:15. No prep. May eat lunch. Called and spoke with pt's wife and she is aware of date, time and location. Given to [place] pre cert. Roderic Palau[place]onda J Cobb  I spoke with pt and pt spouse and appt has been set for 08-08-13. Carron CurieJennifer Keta [place]ken[place], CMA

## 2013-07-16 ENCOUNTER — Ambulatory Visit (INDEPENDENT_AMBULATORY_CARE_PROVIDER_SITE_OTHER)
Admission: RE | Admit: 2013-07-16 | Discharge: 2013-07-16 | Disposition: A | Discharge status: Home / Self Care | Payer: Medicare Other | Source: Ambulatory Visit | Attending: Critical Care Medicine | Admitting: Critical Care Medicine

## 2013-07-16 DIAGNOSIS — T65891A Toxic effect of other specified substances, accidental (unintentional), initial encounter: Secondary | ICD-10-CM

## 2013-07-16 DIAGNOSIS — J920 Pleural plaque with presence of asbestos: Secondary | ICD-10-CM

## 2013-07-16 DIAGNOSIS — R091 Pleurisy: Secondary | ICD-10-CM

## 2013-07-16 NOTE — Progress Notes (Signed)
Quick Note:  Notify the patient that the Xray is stable and no evidence for mesothelioma or lung cancer No change in medications are recommended. Continue current meds as prescribed at last office visit ______

## 2013-07-17 ENCOUNTER — Other Ambulatory Visit: Payer: Self-pay | Admitting: Critical Care Medicine

## 2013-07-17 NOTE — Progress Notes (Signed)
Quick Note:  Called, spoke with pt. Informed him of results and recs per Dr. Delford FieldWright. He verbalized understanding. ______

## 2013-07-31 ENCOUNTER — Ambulatory Visit: Payer: Medicare Other | Admitting: Cardiology

## 2013-08-05 ENCOUNTER — Encounter: Payer: Self-pay | Admitting: *Deleted

## 2013-08-05 ENCOUNTER — Encounter: Payer: Self-pay | Admitting: Cardiology

## 2013-08-06 ENCOUNTER — Ambulatory Visit (INDEPENDENT_AMBULATORY_CARE_PROVIDER_SITE_OTHER): Payer: Medicare Other | Admitting: Cardiology

## 2013-08-06 ENCOUNTER — Encounter: Payer: Self-pay | Admitting: Cardiology

## 2013-08-06 VITALS — BP 122/70 | HR 57 | Ht 66.0 in | Wt 199.1 lb

## 2013-08-06 DIAGNOSIS — E785 Hyperlipidemia, unspecified: Secondary | ICD-10-CM

## 2013-08-06 DIAGNOSIS — R609 Edema, unspecified: Secondary | ICD-10-CM

## 2013-08-06 DIAGNOSIS — I209 Angina pectoris, unspecified: Secondary | ICD-10-CM

## 2013-08-06 DIAGNOSIS — I208 Other forms of angina pectoris: Secondary | ICD-10-CM

## 2013-08-06 DIAGNOSIS — E669 Obesity, unspecified: Secondary | ICD-10-CM

## 2013-08-06 DIAGNOSIS — Z951 Presence of aortocoronary bypass graft: Secondary | ICD-10-CM

## 2013-08-06 DIAGNOSIS — I10 Essential (primary) hypertension: Secondary | ICD-10-CM

## 2013-08-06 DIAGNOSIS — I5020 Unspecified systolic (congestive) heart failure: Secondary | ICD-10-CM

## 2013-08-06 DIAGNOSIS — I2510 Atherosclerotic heart disease of native coronary artery without angina pectoris: Secondary | ICD-10-CM

## 2013-08-06 DIAGNOSIS — I25810 Atherosclerosis of coronary artery bypass graft(s) without angina pectoris: Secondary | ICD-10-CM

## 2013-08-06 NOTE — Patient Instructions (Addendum)
You seem stable from a heart standpoint.  We will keep your medications the same - including taking the fluid pill as needed.  Please ask your primary MD to send a copy of your labs to me.   Your physician wants you to follow-up in 6 month DR HARDING. You will receive a reminder letter in the mail two months in advance. If you don't receive a letter, please call our office to schedule the follow-up appointment.  Marykay Lexavid W Harding, MD

## 2013-08-07 NOTE — Progress Notes (Signed)
PATIENT: Brendan [name] MRN: [id number]  DOB: 1941-01-12   DOV:08/07/2013 PCP: Maryruth Eve, MD  Clinic Note: Chief Complaint  Patient presents with  . 6 mo rov    patient has no problems since last visit   HPI: Brendan [name] is a 73 y.o. male with a PMH below who presents today for six-month followup of CAD. I last saw him in October 2014, and did not make any significant changes.[name] He was a former patient of Dr. Aldona Bar, whom I first met in June 2013 LAD from a cardiac cath position on him for symptoms concerning for unstable angina. He was found to have an occluded vein graft to a diagonal -- treated medically..  He has a history of CABG from back in 2004 with LIMA-LAD, SVG-diagonal, SVG to OM. In June of 2013, LIMA to LAD and SVG to OM were patent. SVG to diagonal occluded. The RCA the mild irregularities. EF is roughly 45% with apical inferior hypokinesis.  He subsequently had a followup echocardiogram in April of 2014 that showed an improved EF of 55-60% with no regional wall motion abnormalities and a relatively normal study. He has long-standing history of significant tremor on propranolol and primidone. He did not do well on exertion to metoprolol, therefore went back to propranolol.  Interval History: He returns today he relatively fine overall. He no longer notes the cough despite having restarted lisinopril. He has not noted worsening dyspnea with exertion, dizziness disease; however, he is not able to do a lot partly because of musculoskeletal issues (pain in his knees) and his tremor. He also has, myalgias and muscle aching throughout his legs and hips. He cannot lie flat partly because of shortness of breath but partly also because of discomfort from musculoskeletal standpoint and GERD. He also notes that his edema is essentially resolved after starting low-dose Lasix. He is now down to just taking it when necessary. He was taking it routinely up until about February. Now taking it  when necessary.  He has not had any symptoms then they can suggest needing to take sublingual nitroglycerin, but he does note occasional fleeting episodes of sharp chest pains. These are not similar to his previous anginal symptoms. His wrists and hands are released Bothering him as are his knees.  The remainder of Cardiovascular ROS: positive for - mild exertional dyspnea negative for - chest pain, irregular heartbeat, loss of consciousness, murmur, palpitations, orthopnea/ paroxysmal nocturnal dyspnea or edema, rapid heart rate or shortness of breath; syncope/near-syncope, TIA/amaurosis fugax; hematochezia melena, hematuria, or epistaxis.  Past Medical History  Diagnosis Date  . CAD in native artery 1997    Referred for CABG x3 in 2004 for LAD diagonal bifurcation lesion  . S/P CABG x 3 2004     LIMA-LAD, SVG to diagonal, SVG to OM  . Unstable angina pectoris  June 2013    Cardiac cath: occluded SVG-DI. Patent LIMA-LAD and SVG-OM. EF 45% with apical inferior HK.  . Coronary atherosclerosis of artery bypass graft June 2013    Occluded SVG-D1; Cardiologist Dr. Ellyn Hack  . Hypertension   . Hyperlipidemia LDL goal <70   . Gastroesophageal reflux disease   . Peripheral neuropathy     possible peripheral neuropathy  . Memory loss     Mild  . Gait disorder   . Benign enlargement of prostate   . Anxiety disorder   . Obesity   . Tremor, essential     On Primidone  . Cervical  spinal stenosis   . Glaucoma   . Hiatal hernia     GI: Dr Penelope Coop  . Complex regional pain syndrome of right upper extremity     Right wrist  . Reflex sympathetic dystrophy     Past Cardiovascular Evaluation/Procedures   Procedure Laterality Date  . Coronary artery bypass graft  06/2002    LIMA-LAD, SVG-D1, SVG-OM  . Nm myocar perf wall motion  03/23/2009    protocol:Bruce, normal perfusion in all regions, post-stress EF 72%, exercise capacity 7METS, EKG negative for ischemia.  . Transthoracic echocardiogram   April 2014    EF 55-60%; no regional wall motion abnormalities. Relatively normal study normal diastolic parameters/abnormal relaxation   Allergies and Medications reviewed in Epic Social and Family History reviewed in Epic  ROS: A comprehensive Review of Systems - Negative except Pertinent symptoms noted above. Mild indigestion. As the only times and discomfort in his chest. No significant bleeding issues.  PHYSICAL EXAM BP 122/70  Pulse 57  Ht $R'5\' 6"'ED$  (1.676 m)  Wt 199 lb 1.6 oz (90.311 kg)  BMI 32.15 kg/m2 -- represent a 6 pound weight gain since last visit General appearance: alert, cooperative, appears stated age, NAD; and mildly obese; well-nourished, well-groomed. Answers questions appropriately. Skin baseline tremor of left hand that shakes his whole shoulder and head HEENT: [place]/AT, EOMI, MMM, anicteric sclera Neck: no adenopathy, no carotid bruit and no JVD;  Lungs: CTA B., normal percussion bilaterally and non-labored Heart: regular rate and rhythm, S1, S2 normal, no murmur, click, rub or gallop; nondisplaced PMI Abdomen: soft, non-tender; bowel sounds normal; no masses,  no organomegaly;  Extremities: extremities normal, atraumatic, no cyanosis, and roughly 2+ right and 1+ left lower extremity edema  Pulses: 2+ and symmetric;  Skin: Mild venous stasis changes found. Neurologic: Alert, oriented, thought content appropriate; Cranial nerves: normal (II-XII grossly intact)  OVF:IEPPIRJJO today: Yes Rate:57, Rhythm: Sinus bradycardia, poor anterior wave progression, LAFB. No significant change;    Recent Labs: Labs from August reviewed, chemistry panel, CBC, but not lipids. Per PCP: Labs were last checked in April 2013, and appeared to be at goal. Not checked based on new guidelines.  ASSESSMENT / PLAN: Overall essentially stable. We'll continue current regimen.  CAD in native artery Severe CAD with history of CABG. Known occlusion of SVG-diagonal. No active anginal  symptoms. Continue aspirin, beta blocker, ACE inhibitor and statin. When necessary nitroglycerin  CORONARY ATHEROSCLEROSIS, ARTERY BYPASS GRAFT Stable. Not due for stress test until 2017  Edema Notably improved. Now just on when necessary Lasix. Continue current course  Hyperlipidemia LDL goal <70 Due for lab check this year by PCP. I have asked that he request a copy be sent to [place]. Last known lab demonstrated good control. Basically at goal. He is on simvastatin, which he seems to be tolerating well.  Stable angina No recurrent symptoms. He may simply not be exerting himself to the point where he would have symptoms.  Systolic CHF -  noted to be resolved by echo EF is essentially back to normal by echocardiogram last year. He is also still on beta blocker and ACE inhibitor for his CAD.  Obesity (BMI 30-39.9) But is extremely limited mobility, we are limited to dietary modification. I think in this discussion his daughter is the only one who may actually be of help play role. He has gained 5 pounds since August of last year. He is therefore going the wrong direction.    Orders Placed This Encounter  Procedures  .  EKG 12-Lead   Meds ordered this encounter  Medications  . furosemide (LASIX) 20 MG tablet    Sig: Take 20 mg by mouth as needed.   Followup: 6 months  DAVID W. Ellyn Hack, M.D., M.S. [facility] [address] [place], [place]  [id number]  [phone] Pager # [phone]

## 2013-08-07 NOTE — Assessment & Plan Note (Signed)
Notably improved. Now just on when necessary Lasix. Continue current course

## 2013-08-07 NOTE — Assessment & Plan Note (Signed)
No recurrent symptoms. He may simply not be exerting himself to the point where he would have symptoms.

## 2013-08-07 NOTE — Assessment & Plan Note (Signed)
Stable. Not due for stress test until 2017

## 2013-08-07 NOTE — Assessment & Plan Note (Addendum)
Due for lab check this year by PCP. I have asked that he request a copy be sent to us. Last known lab demonstrated good control. Basically at goal. He is on simvastatin, which he seems to be tolerating well.

## 2013-08-07 NOTE — Assessment & Plan Note (Signed)
EF is essentially back to normal by echocardiogram last year. He is also still on beta blocker and ACE inhibitor for his CAD.

## 2013-08-07 NOTE — Assessment & Plan Note (Addendum)
Severe CAD with history of CABG. Known occlusion of SVG-diagonal. No active anginal symptoms. Continue aspirin, beta blocker, ACE inhibitor and statin. When necessary nitroglycerin

## 2013-08-07 NOTE — Assessment & Plan Note (Signed)
But is extremely limited mobility, we are limited to dietary modification. I think in this discussion his daughter is the only one who may actually be of help play role. He has gained 5 pounds since August of last year. He is therefore going the wrong direction.

## 2013-08-08 ENCOUNTER — Ambulatory Visit: Payer: Medicare Other | Admitting: Critical Care Medicine

## 2013-08-20 ENCOUNTER — Encounter: Payer: Self-pay | Admitting: Critical Care Medicine

## 2013-08-20 ENCOUNTER — Ambulatory Visit (INDEPENDENT_AMBULATORY_CARE_PROVIDER_SITE_OTHER): Payer: Medicare Other | Admitting: Critical Care Medicine

## 2013-08-20 VITALS — BP 118/66 | HR 57 | Temp 98.7°F | Ht 67.0 in | Wt 200.0 lb

## 2013-08-20 DIAGNOSIS — R091 Pleurisy: Secondary | ICD-10-CM

## 2013-08-20 DIAGNOSIS — J920 Pleural plaque with presence of asbestos: Secondary | ICD-10-CM

## 2013-08-20 DIAGNOSIS — T65891A Toxic effect of other specified substances, accidental (unintentional), initial encounter: Secondary | ICD-10-CM

## 2013-08-20 NOTE — Progress Notes (Signed)
Subjective:    Patient ID: Brendan [name], male    DOB: 02/20/1941, 73 y.o.   MRN: [id number]  HPI  73 y.o. M This patient is referred for an abnormal CT scan of the chest. Patient was seen by sports medicine for hand swelling. Workup of this included a chest x-ray and subsequent to this a CT scan of the chest. A CT scan of the chest shows pleural plaquing with calcifications. The patient does give a history of asbestos exposure working in [employer] in the 1960s. The patient has not had significant asbestos exposure since the 1960s is noted. The patient denies any current pulmonary complaints. There is no dyspnea, cough, chest pain, fever chills or sweats. There is no change in weight. There is no excess mucus. The patient does have a history of coronary artery disease but no prior history of significant pulmonary disease.  08/20/2013 Chief Complaint  Patient presents with  . Yearly Follow up    Had CT Chest in April 2015.  Gets fatigued easily and has SOB with activity - symptoms unchanged.  Coughing at times - nonprod.  Occas chest tightness.  Ct Chest neg for CA .  Dyspnea same.  Cough is dry     Review of Systems  Constitutional: Negative for fever, chills, diaphoresis, activity change, appetite change, fatigue and unexpected weight change.  HENT: Negative for congestion, dental problem, ear discharge, ear pain, facial swelling, hearing loss, mouth sores, nosebleeds, postnasal drip, rhinorrhea, sinus pressure, sneezing, sore throat, tinnitus, trouble swallowing and voice change.   Eyes: Negative for photophobia, discharge, itching and visual disturbance.  Respiratory: Positive for shortness of breath. Negative for apnea, cough, choking, chest tightness, wheezing and stridor.   Cardiovascular: Negative for chest pain, palpitations and leg swelling.  Gastrointestinal: Negative for nausea, vomiting, abdominal pain, constipation, blood in stool and abdominal distention.  Genitourinary:  Negative for dysuria, urgency, frequency, hematuria, flank pain, decreased urine volume and difficulty urinating.  Musculoskeletal: Positive for arthralgias, joint swelling and myalgias. Negative for back pain, gait problem, neck pain and neck stiffness.  Skin: Negative for color change, pallor and rash.  Neurological: Negative for dizziness, tremors, seizures, syncope, speech difficulty, weakness, light-headedness, numbness and headaches.  Hematological: Negative for adenopathy. Does not bruise/bleed easily.  Psychiatric/Behavioral: Negative for confusion, sleep disturbance and agitation. The patient is not nervous/anxious.        Objective:   Physical Exam  Filed Vitals:   08/20/13 1158  BP: 118/66  Pulse: 57  Temp: 98.7 F (37.1 C)  TempSrc: Oral  Height: 5\' 7"  (1.702 m)  Weight: 200 lb (90.719 kg)  SpO2: 96%    Gen: Pleasant, well-nourished, in no distress,  normal affect  ENT: No lesions,  mouth clear,  oropharynx clear, no postnasal drip  Neck: No JVD, no TMG, no carotid bruits  Lungs: No use of accessory muscles, no dullness to percussion, clear without rales or rhonchi  Cardiovascular: RRR, heart sounds normal, no murmur or gallops, no peripheral edema  Abdomen: soft and NT, no HSM,  BS normal  Musculoskeletal: No deformities, no cyanosis or clubbing  Neuro: alert, non focal  Skin: Warm, no lesions or rashes  CT scan of the chest is reviewed from 08/20/2013 and reveals bilateral pleural plaquing with calcification. No evidence of asbestosis. No evidence of interstitial scar or reticular nodular infiltrate. No evidence of mesothelioma seen.  Pulmonary functions obtained on 05/15/2012 reveal: FEV1 61% addicted FVC 63%  FEV1 to FVC ratio 66% predicted  predicted FEF 25 75  32% predicted total lung capacity 65% predicted diffusion capacity 75% predicted     Assessment & Plan:   Pleural plaque with presence of asbestos Asbestos-related pleural disease with  associated pleural plaquing No evidence of mesothelioma or malignancy Plan Repeat CT scan of chest in 1 year    Updated Medication List Outpatient Encounter Prescriptions as of 08/20/2013  Medication Sig  . aspirin (LONGS ADULT LOW STRENGTH ASA) 81 MG EC tablet Take 81 mg by mouth daily.    . fish oil-omega-3 fatty acids 1000 MG capsule Take 1 g by mouth 2 (two) times daily.  . furosemide (LASIX) 20 MG tablet Take 20 mg by mouth as needed.  [name]. lisinopril (PRINIVIL,ZESTRIL) 10 MG tablet Take 1 tablet (10 mg total) by mouth daily.  . nitroGLYCERIN (NITROSTAT) 0.4 MG SL tablet Place 1 tablet (0.4 mg total) under the tongue every 5 (five) minutes as needed for chest pain. call 911 if chest pain not better  . primidone (MYSOLINE) 250 MG tablet Take 1 tablet (250 mg total) by mouth 2 (two) times daily.  . propranolol (INDERAL) 80 MG tablet Take 1 tablet (80 mg total) by mouth 2 (two) times daily.  . simvastatin (ZOCOR) 80 MG tablet Take 1 tablet (80 mg total) by mouth at bedtime.  . [DISCONTINUED] gabapentin (NEURONTIN) 300 MG capsule Take 1 capsule (300 mg total) by mouth 2 (two) times daily.  . [DISCONTINUED] omeprazole (PRILOSEC) 20 MG capsule Take 1 capsule (20 mg total) by mouth daily as needed (acid reflux).  . [DISCONTINUED] traMADol (ULTRAM) 50 MG tablet Take 1 tablet (50 mg total) by mouth 3 (three) times daily as needed for pain.

## 2013-08-20 NOTE — Patient Instructions (Signed)
No change in medications. Return in         1 year, get another CT Scan of chest in one year, we will call to remind you

## 2013-08-21 ENCOUNTER — Ambulatory Visit (INDEPENDENT_AMBULATORY_CARE_PROVIDER_SITE_OTHER): Payer: Medicare Other | Admitting: Emergency Medicine

## 2013-08-21 ENCOUNTER — Encounter: Payer: Self-pay | Admitting: Emergency Medicine

## 2013-08-21 VITALS — BP 150/96 | HR 71 | Ht 67.0 in | Wt 198.0 lb

## 2013-08-21 DIAGNOSIS — I10 Essential (primary) hypertension: Secondary | ICD-10-CM

## 2013-08-21 DIAGNOSIS — G252 Other specified forms of tremor: Secondary | ICD-10-CM

## 2013-08-21 DIAGNOSIS — I509 Heart failure, unspecified: Secondary | ICD-10-CM

## 2013-08-21 DIAGNOSIS — I25810 Atherosclerosis of coronary artery bypass graft(s) without angina pectoris: Secondary | ICD-10-CM

## 2013-08-21 DIAGNOSIS — G250 Essential tremor: Secondary | ICD-10-CM

## 2013-08-21 LAB — COMPLETE METABOLIC PANEL WITH GFR
ALT: 12 U/L (ref 0–53)
AST: 16 U/L (ref 0–37)
Albumin: 4.5 g/dL (ref 3.5–5.2)
Alkaline Phosphatase: 106 U/L (ref 39–117)
BUN: 12 mg/dL (ref 6–23)
CO2: 27 mEq/L (ref 19–32)
Calcium: 9.2 mg/dL (ref 8.4–10.5)
Chloride: 103 mEq/L (ref 96–112)
Creat: 1.14 mg/dL (ref 0.50–1.35)
GFR, Est African American: 75 mL/min
GFR, Est Non African American: 65 mL/min
Glucose, Bld: 109 mg/dL — ABNORMAL HIGH (ref 70–99)
Potassium: 5.2 mEq/L (ref 3.5–5.3)
Sodium: 139 mEq/L (ref 135–145)
Total Bilirubin: 0.8 mg/dL (ref 0.2–1.2)
Total Protein: 7.2 g/dL (ref 6.0–8.3)

## 2013-08-21 LAB — LIPID PANEL
Cholesterol: 136 mg/dL (ref 0–200)
HDL: 34 mg/dL — ABNORMAL LOW (ref >39)
LDL Cholesterol: 83 mg/dL (ref 0–99)
Total CHOL/HDL Ratio: 4 Ratio
Triglycerides: 95 mg/dL (ref <150)
VLDL: 19 mg/dL (ref 0–40)

## 2013-08-21 LAB — IRON: Iron: 128 ug/dL (ref 42–165)

## 2013-08-21 MED ORDER — TRAMADOL HCL 50 MG PO TABS: 50.0000 mg | ORAL_TABLET | Freq: Three times a day (TID) | ORAL | Status: DC | PRN

## 2013-08-21 MED ORDER — OMEPRAZOLE 20 MG PO CPDR
20.0000 mg | DELAYED_RELEASE_CAPSULE | Freq: Every day | ORAL | Status: DC | PRN
Start: 2013-08-21 — End: 2014-11-12

## 2013-08-21 MED ORDER — GABAPENTIN 300 MG PO CAPS: 300.0000 mg | ORAL_CAPSULE | Freq: Two times a day (BID) | ORAL | Status: DC

## 2013-08-21 NOTE — Patient Instructions (Signed)
It was nice to see you!  Everything looks good.  We are checking some blood work today.  I will call you if anything is wrong, otherwise you will get a letter with the results in the mail in 1-2 weeks.  I will send the results on to Dr. Herbie BaltimoreHarding.  Follow up in 3 months, or sooner as needed.

## 2013-08-21 NOTE — Assessment & Plan Note (Signed)
Followed by neurology. Worsening per patient. Continue on primidone and propranolol. F/u with Dr. Anne HahnWillis.

## 2013-08-21 NOTE — Assessment & Plan Note (Signed)
Asbestos-related pleural disease with associated pleural plaquing No evidence of mesothelioma or malignancy Plan Repeat CT scan of chest in 1 year

## 2013-08-21 NOTE — Assessment & Plan Note (Signed)
Followed by Dr. Herbie BaltimoreHarding in cardiology. Doing very well on his medications. BP mildly elevated today, but recent numbers excellent. Continue lisinopril, lasix, propranolol, simvastatin, aspirin, NTG prn. Lipid panel today - will send results to Dr. Herbie BaltimoreHarding.

## 2013-08-21 NOTE — Assessment & Plan Note (Signed)
Overall well controlled. No changes. 

## 2013-08-21 NOTE — Progress Notes (Signed)
   Subjective:    Patient ID: Brendan [name], male    DOB: 01/27/1941, 73 y.o.   MRN: [id number]  HPI Brendan [name] is here for medication refill and followup cardiologist.  He was seen by his cardiologist, Dr. Herbie BaltimoreHarding, yesterday. He is doing very well from a cardiac standpoint. Dr. Herbie BaltimoreHarding would like for me to draw labs, including a lipid panel. Patient denies any chest pain, shortness of breath, dizzy spells. He does have some intermittent lower extremity edema for which he takes Lasix as needed.  He needs refills on several of his medications. He is on gabapentin and tramadol for her complex regional pain syndrome. He states this has been stable.  Current Outpatient Prescriptions on File Prior to Visit  Medication Sig Dispense Refill  . aspirin (LONGS ADULT LOW STRENGTH ASA) 81 MG EC tablet Take 81 mg by mouth daily.        . fish oil-omega-3 fatty acids 1000 MG capsule Take 1 g by mouth 2 (two) times daily.      . furosemide (LASIX) 20 MG tablet Take 20 mg by mouth as needed.      [name]. lisinopril (PRINIVIL,ZESTRIL) 10 MG tablet Take 1 tablet (10 mg total) by mouth daily.  30 tablet  11  . nitroGLYCERIN (NITROSTAT) 0.4 MG SL tablet Place 1 tablet (0.4 mg total) under the tongue every 5 (five) minutes as needed for chest pain. call 911 if chest pain not better  30 tablet  3  . primidone (MYSOLINE) 250 MG tablet Take 1 tablet (250 mg total) by mouth 2 (two) times daily.      . propranolol (INDERAL) 80 MG tablet Take 1 tablet (80 mg total) by mouth 2 (two) times daily.  180 tablet  4  . simvastatin (ZOCOR) 80 MG tablet Take 1 tablet (80 mg total) by mouth at bedtime.  90 tablet  4   Current Facility-Administered Medications on File Prior to Visit  Medication Dose Route Frequency Provider Last Rate Last Dose  . pneumococcal 23 valent vaccine (PNU-IMMUNE) injection 0.5 mL  0.5 mL Intramuscular Once Charm RingsErin J Zakariye Nee, MD        I have reviewed and updated the following as appropriate: allergies and  current medications SHx: non smoker   Review of Systems See HPI    Objective:   Physical Exam BP 150/96  Pulse 71  Ht 5\' 7"  (1.702 m)  Wt 198 lb (89.812 kg)  BMI 31.00 kg/m2 Gen: alert, cooperative, NAD HEENT: AT/[place], sclera white, MMM Neck: supple, no LAD, no bruits CV: RRR, no murmurs Pulm: CTAB, no wheezes or rales Ext: 1+ edema slightly worse on right     Assessment & Plan:

## 2013-08-22 LAB — FERRITIN: Ferritin: 36 ng/mL (ref 22–322)

## 2013-12-04 ENCOUNTER — Telehealth: Payer: Self-pay | Admitting: Neurology

## 2013-12-04 NOTE — Telephone Encounter (Signed)
Patient's wife Corrie DandyMary calling to state that Medicare called saying that they need prior authorization for gloves and knee brace, but patient does not want this. If questions, please call and advise.

## 2013-12-05 NOTE — Telephone Encounter (Signed)
Spoke with patient's wife in regards to PA. She says that they do not need authorization for either gloves or knee brace. She will just get them otc.

## 2014-01-12 ENCOUNTER — Encounter: Payer: Self-pay | Admitting: Family Medicine

## 2014-01-12 ENCOUNTER — Other Ambulatory Visit: Payer: Self-pay | Admitting: Family Medicine

## 2014-01-12 ENCOUNTER — Ambulatory Visit (INDEPENDENT_AMBULATORY_CARE_PROVIDER_SITE_OTHER): Payer: Medicare Other | Admitting: Family Medicine

## 2014-01-12 VITALS — BP 127/73 | HR 59 | Temp 98.6°F | Wt 198.9 lb

## 2014-01-12 DIAGNOSIS — J309 Allergic rhinitis, unspecified: Secondary | ICD-10-CM

## 2014-01-12 DIAGNOSIS — Z23 Encounter for immunization: Secondary | ICD-10-CM

## 2014-01-12 DIAGNOSIS — R6881 Early satiety: Secondary | ICD-10-CM

## 2014-01-12 DIAGNOSIS — IMO0002 Reserved for concepts with insufficient information to code with codable children: Secondary | ICD-10-CM

## 2014-01-12 DIAGNOSIS — G9050 Complex regional pain syndrome I, unspecified: Secondary | ICD-10-CM

## 2014-01-12 DIAGNOSIS — I10 Essential (primary) hypertension: Secondary | ICD-10-CM

## 2014-01-12 LAB — CBC WITH DIFFERENTIAL/PLATELET
Basophils Absolute: 0 10*3/uL (ref 0.0–0.1)
Basophils Relative: 0 % (ref 0–1)
Eosinophils Absolute: 0.2 10*3/uL (ref 0.0–0.7)
Eosinophils Relative: 3 % (ref 0–5)
HCT: 41.5 % (ref 39.0–52.0)
Hemoglobin: 14.3 g/dL (ref 13.0–17.0)
Lymphocytes Relative: 26 % (ref 12–46)
Lymphs Abs: 1.5 10*3/uL (ref 0.7–4.0)
MCH: 33.9 pg (ref 26.0–34.0)
MCHC: 34.5 g/dL (ref 30.0–36.0)
MCV: 98.3 fL (ref 78.0–100.0)
Monocytes Absolute: 0.5 10*3/uL (ref 0.1–1.0)
Monocytes Relative: 8 % (ref 3–12)
Neutro Abs: 3.7 10*3/uL (ref 1.7–7.7)
Neutrophils Relative %: 63 % (ref 43–77)
Platelets: 152 10*3/uL (ref 150–400)
RBC: 4.22 MIL/uL (ref 4.22–5.81)
RDW: 14.6 % (ref 11.5–15.5)
WBC: 5.8 10*3/uL (ref 4.0–10.5)

## 2014-01-12 LAB — COMPREHENSIVE METABOLIC PANEL
ALT: 13 U/L (ref 0–53)
AST: 13 U/L (ref 0–37)
Albumin: 4.6 g/dL (ref 3.5–5.2)
Alkaline Phosphatase: 106 U/L (ref 39–117)
BUN: 15 mg/dL (ref 6–23)
CO2: 28 mEq/L (ref 19–32)
Calcium: 9.4 mg/dL (ref 8.4–10.5)
Chloride: 102 mEq/L (ref 96–112)
Creat: 1.05 mg/dL (ref 0.50–1.35)
Glucose, Bld: 129 mg/dL — ABNORMAL HIGH (ref 70–99)
Potassium: 4.7 mEq/L (ref 3.5–5.3)
Sodium: 139 mEq/L (ref 135–145)
Total Bilirubin: 0.6 mg/dL (ref 0.2–1.2)
Total Protein: 7 g/dL (ref 6.0–8.3)

## 2014-01-12 LAB — TSH: TSH: 11.962 u[IU]/mL — ABNORMAL HIGH (ref 0.350–4.500)

## 2014-01-12 MED ORDER — TRAMADOL HCL 50 MG PO TABS: 50.0000 mg | ORAL_TABLET | Freq: Three times a day (TID) | ORAL | Status: DC | PRN

## 2014-01-12 MED ORDER — GABAPENTIN 300 MG PO CAPS: ORAL_CAPSULE | ORAL | Status: DC

## 2014-01-12 MED ORDER — FLUTICASONE PROPIONATE 50 MCG/ACT NA SUSP: 2.0000 | Freq: Every day | NASAL | Status: DC

## 2014-01-12 NOTE — Assessment & Plan Note (Signed)
Not completely controlled.  Continue tramadol  BID (Rx given).  Increase gabapentin dose to  qAM and  qhs.  F/u in 3 months.

## 2014-01-12 NOTE — Assessment & Plan Note (Signed)
Early satiety and increased frequency of BMs.  Some concern for malignancy.  Last screening colonoscopy in 2007 normal.  TSH, CBC, and CMET today.  Refer to GI.

## 2014-01-12 NOTE — Progress Notes (Signed)
Subjective:   Brendan [name] is a 73 y.o. male with a history of CAD s/p CABG, complex regional pain syndrome, HTN, HLD here for f/u of chronic issues, rhinitis, and change in bowel habits.  1. Complex regional pain syndrome: Pain is intermittently uncontrolled.  Tramadol works well for pain during the day.  Pain is worse while trying to sleep at night. He used to receive injections from Dr. Yolanda Bonine but they did not seem to help.  2. HTN: Taking Propranolol  BID and Lisinopril  daily. Denies CP, SOB, lightheadedness, LE edema.   3. Rhinitis: Patient has rhinitis that occurs first thing in AM daily for ~1 year.  He denies sneezing, watery/itchy eyes. No h/o allergic rhinitis.  4. GI: Patient and wife report increase in frequency of BMs. For ~6 months, he has been having BM every time that he eats, ~4-5 times daily.  It is not watery, no BRBPR or melena, no wieght loss, no abd pain.  He does endorse decreased PO intake 2/2 early satiety.  Last screening colonoscopy in 2007.  Review of Systems:  Per HPI. 10 other systems reviewed and are negative.    Past Medical History: Patient Active Problem List   Diagnosis Date Noted  . Rhinitis, allergic 01/12/2014  . Early satiety 01/12/2014  . Stable angina 08/06/2013  . Obesity (BMI 30-39.9) 01/30/2013  . Edema 01/30/2013  . S/P CABG x 3   . CAD in native artery   . Hyperlipidemia LDL goal <70   . Memory problem 07/01/2012  . Abnormality of gait 06/26/2012  . Complex regional pain syndrome 05/24/2012  . Pleural plaque with presence of asbestos 05/15/2012  . Anemia, B12 deficiency 03/19/2012  . Right hand pain 02/21/2012  . Systolic CHF -  noted to be resolved by echo 12/05/2011  . Unstable angina pectoris 09/16/2011  . BPH (benign prostatic hyperplasia) 08/08/2011  . Microscopic hematuria 02/27/2011  . Right leg pain 02/27/2011  . Anxiety 10/03/2010  . Incontinence 07/21/2010  . OVERWEIGHT 07/14/2009  . ANGINA, STABLE  04/29/2008  . GERD 10/28/2007  . HYPERLIPIDEMIA, MIXED 08/30/2006  . TREMOR, ESSENTIAL 08/30/2006  . CORONARY ATHEROSCLEROSIS, ARTERY BYPASS GRAFT 08/30/2006  . HYPERTENSION, BENIGN 07/06/2006    Medications: reviewed and updated Current Outpatient Prescriptions  Medication Sig Dispense Refill  . aspirin (LONGS ADULT LOW STRENGTH ASA) 81 MG EC tablet Take 81 mg by mouth daily.        . fish oil-omega-3 fatty acids 1000 MG capsule Take 1 g by mouth 2 (two) times daily.      . fluticasone (FLONASE) 50 MCG/ACT nasal spray Place 2 sprays into both nostrils daily.  16 g  6  . furosemide (LASIX) 20 MG tablet Take 20 mg by mouth as needed.      . gabapentin (NEURONTIN) 300 MG capsule Take 1 pill PO qAM and take 2 pills PO qhs.  270 capsule  3  . lisinopril (PRINIVIL,ZESTRIL) 10 MG tablet Take 1 tablet (10 mg total) by mouth daily.  30 tablet  11  . nitroGLYCERIN (NITROSTAT) 0.4 MG SL tablet Place 1 tablet (0.4 mg total) under the tongue every 5 (five) minutes as needed for chest pain. call 911 if chest pain not better  30 tablet  3  . omeprazole (PRILOSEC) 20 MG capsule Take 1 capsule (20 mg total) by mouth daily as needed (acid reflux).  90 capsule  3  . primidone (MYSOLINE) 250 MG tablet Take 1 tablet (250 mg total) by  mouth 2 (two) times daily.      . propranolol (INDERAL) 80 MG tablet Take 1 tablet (80 mg total) by mouth 2 (two) times daily.  180 tablet  4  . simvastatin (ZOCOR) 80 MG tablet Take 1 tablet (80 mg total) by mouth at bedtime.  90 tablet  4  . traMADol (ULTRAM) 50 MG tablet Take 1 tablet (50 mg total) by mouth 3 (three) times daily as needed.  90 tablet  2   Current Facility-Administered Medications  Medication Dose Route Frequency Provider Last Rate Last Dose  . pneumococcal 23 valent vaccine (PNU-IMMUNE) injection 0.5 mL  0.5 mL Intramuscular Once Charm Rings, MD        Social History: former smoker  Objective:  BP 127/73  Pulse 59  Temp(Src) 98.6 F (37 C) (Oral)  Wt  198 lb 14.4 oz (90.22 kg)  Gen:  73 y.o. male sitting in NAD HEENT: NCAT, MMM, EOMI, PERRL, anicteric sclerae, nasaol turbinates inflamed, TMs clear b/l, OP clear CV: RRR, no MRG, no JVD, 2+ DP pulses b/l Resp: Non-labored, CTAB, no wheezes/crackles noted Abd: Soft, NTND, BS present, no guarding or organomegaly, diastasis recti present Ext: WWP, no edema MSK: Full ROM, tremor present in b/l arms and head. Neuro: Alert and oriented, speech normal      Chemistry      Component Value Date/Time   NA 139 08/21/2013 1422   NA 142 11/27/2012 1133   K 5.2 08/21/2013 1422   CL 103 08/21/2013 1422   CO2 27 08/21/2013 1422   BUN 12 08/21/2013 1422   BUN 13 11/27/2012 1133   CREATININE 1.14 08/21/2013 1422   CREATININE 1.13 11/27/2012 1133      Component Value Date/Time   CALCIUM 9.2 08/21/2013 1422   ALKPHOS 106 08/21/2013 1422   AST 16 08/21/2013 1422   ALT 12 08/21/2013 1422   BILITOT 0.8 08/21/2013 1422      Lab Results  Component Value Date   WBC 5.6 11/27/2012   HGB 14.0 11/27/2012   HCT 40.3 11/27/2012   MCV 97 11/27/2012   PLT 154 11/27/2012    Lab Results  Component Value Date   HGBA1C 5.4 12/05/2011   Assessment:     Brendan [name] is a 73 y.o. male here for f/u of complex regional pain syndrome, f/u HTN, allergic rhinitis, and change in bowel habits/early satiety.    Plan:     See problem list for problem-specific plans.   Shirlee Latch, MD PGY-1,  [facility] 01/12/2014  9:50 AM

## 2014-01-12 NOTE — Patient Instructions (Signed)
It was nice to meet you today!  Your blood pressure is great.  Keep taking your current medications.  I am prescribing flonase for your allergic symptoms.  I am refilling your tramadol and increasing your gabapentin dose for your nerve pain.  Keep taking 1 pill of the gabapentin every morning and start taking 2 pills every evening.  I am getting some blood work and referring you to gastroenterology today for the change in your bowel habits and getting full early.  I will call/send you a letter with the results soon.  Best, Dr. Beryle Flock (Dr. B)  Allergic Rhinitis Allergic rhinitis is when the mucous membranes in the nose respond to allergens. Allergens are particles in the air that cause your body to have an allergic reaction. This causes you to release allergic antibodies. Through a chain of events, these eventually cause you to release histamine into the blood stream. Although meant to protect the body, it is this release of histamine that causes your discomfort, such as frequent sneezing, congestion, and an itchy, runny nose.  CAUSES  Seasonal allergic rhinitis (hay fever) is caused by pollen allergens that may come from grasses, trees, and weeds. Year-round allergic rhinitis (perennial allergic rhinitis) is caused by allergens such as house dust mites, pet dander, and mold spores.  SYMPTOMS   Nasal stuffiness (congestion).  Itchy, runny nose with sneezing and tearing of the eyes. DIAGNOSIS  Your health care provider can help you determine the allergen or allergens that trigger your symptoms. If you and your health care provider are unable to determine the allergen, skin or blood testing may be used. TREATMENT  Allergic rhinitis does not have a cure, but it can be controlled by:  Medicines and allergy shots (immunotherapy).  Avoiding the allergen. Hay fever may often be treated with antihistamines in pill or nasal spray forms. Antihistamines block the effects of histamine. There are  over-the-counter medicines that may help with nasal congestion and swelling around the eyes. Check with your health care provider before taking or giving this medicine.  If avoiding the allergen or the medicine prescribed do not work, there are many new medicines your health care provider can prescribe. Stronger medicine may be used if initial measures are ineffective. Desensitizing injections can be used if medicine and avoidance does not work. Desensitization is when a patient is given ongoing shots until the body becomes less sensitive to the allergen. Make sure you follow up with your health care provider if problems continue. HOME CARE INSTRUCTIONS It is not possible to completely avoid allergens, but you can reduce your symptoms by taking steps to limit your exposure to them. It helps to know exactly what you are allergic to so that you can avoid your specific triggers. SEEK MEDICAL CARE IF:   You have a fever.  You develop a cough that does not stop easily (persistent).  You have shortness of breath.  You start wheezing.  Symptoms interfere with normal daily activities. Document Released: 12/27/2000 Document Revised: 04/08/2013 Document Reviewed: 12/09/2012 [facility] Patient Information 2015 [place], [place]. This information is not intended to replace advice given to you by your health care provider. Make sure you discuss any questions you have with your health care provider.

## 2014-01-12 NOTE — Assessment & Plan Note (Signed)
Early morning rhinitis likely allergic x2yr.  Rx for flonase.

## 2014-01-12 NOTE — Assessment & Plan Note (Signed)
Well controlled. Continue current medications  

## 2014-01-13 ENCOUNTER — Telehealth: Payer: Self-pay | Admitting: *Deleted

## 2014-01-13 ENCOUNTER — Telehealth: Payer: Self-pay | Admitting: Family Medicine

## 2014-01-13 DIAGNOSIS — R6881 Early satiety: Secondary | ICD-10-CM

## 2014-01-13 NOTE — Telephone Encounter (Signed)
Left message with male for patient to call back regarding labs

## 2014-01-13 NOTE — Telephone Encounter (Signed)
Patient informed of labs, scheduled for lab visit on 10/1.

## 2014-01-13 NOTE — Telephone Encounter (Signed)
Please let patient know about his lab results (especially his high TSH).  This would like give him constipation, not an increase in BM frequency.  I would like him to come in for a lab visit for a confirmatory free T4 at his convienence.  I am putting in a future order for it.  Otherwise, his labs look great.

## 2014-01-14 LAB — T4, FREE: Free T4: 1.05 ng/dL (ref 0.80–1.80)

## 2014-01-15 ENCOUNTER — Other Ambulatory Visit: Payer: Medicare Other

## 2014-01-16 ENCOUNTER — Telehealth: Payer: Self-pay | Admitting: Family Medicine

## 2014-01-16 NOTE — Telephone Encounter (Addendum)
Please call and let patient know that T4 (confirmatory thyroid test) was normal.  This means that his thyroid function is normal.  The TSH can be elevated for other reasons.  It will likely come back down.  I will recheck it at our next visit, within the next 6 months.  It is important that he still go for the GI appt that I referred him to during the last visit.  Shirlee LatchAngela Mithra Spano, MD, MPH PGY-1,  [facility] 01/16/2014  1:59 PM

## 2014-01-16 NOTE — Telephone Encounter (Signed)
Pt wife informed. Blount, [place] CMA

## 2014-02-02 ENCOUNTER — Encounter: Payer: Self-pay | Admitting: Cardiology

## 2014-02-02 ENCOUNTER — Other Ambulatory Visit: Payer: Self-pay | Admitting: *Deleted

## 2014-02-02 ENCOUNTER — Ambulatory Visit (INDEPENDENT_AMBULATORY_CARE_PROVIDER_SITE_OTHER): Payer: Medicare Other | Admitting: Cardiology

## 2014-02-02 VITALS — BP 120/66 | HR 51 | Ht 67.0 in | Wt 189.7 lb

## 2014-02-02 DIAGNOSIS — R609 Edema, unspecified: Secondary | ICD-10-CM

## 2014-02-02 DIAGNOSIS — I25119 Atherosclerotic heart disease of native coronary artery with unspecified angina pectoris: Secondary | ICD-10-CM

## 2014-02-02 DIAGNOSIS — Z951 Presence of aortocoronary bypass graft: Secondary | ICD-10-CM

## 2014-02-02 DIAGNOSIS — E785 Hyperlipidemia, unspecified: Secondary | ICD-10-CM

## 2014-02-02 DIAGNOSIS — I10 Essential (primary) hypertension: Secondary | ICD-10-CM

## 2014-02-02 DIAGNOSIS — I25708 Atherosclerosis of coronary artery bypass graft(s), unspecified, with other forms of angina pectoris: Secondary | ICD-10-CM

## 2014-02-02 DIAGNOSIS — E669 Obesity, unspecified: Secondary | ICD-10-CM

## 2014-02-02 DIAGNOSIS — I208 Other forms of angina pectoris: Secondary | ICD-10-CM

## 2014-02-02 DIAGNOSIS — F419 Anxiety disorder, unspecified: Secondary | ICD-10-CM

## 2014-02-02 MED ORDER — LISINOPRIL 10 MG PO TABS: 10.0000 mg | ORAL_TABLET | Freq: Every day | ORAL | Status: DC

## 2014-02-02 NOTE — Patient Instructions (Signed)
Your physician recommends that you schedule a follow-up appointment in: 6 months with Dr Harding 

## 2014-02-09 NOTE — Assessment & Plan Note (Signed)
Severe native CAD with CABG and known occlusion of the SVG-diagonal. He does have mild chronic stable exertional dyspnea /angina. No worsening of symptoms. He is on Inderal which helps his tremors as well as ACE inhibitor, statin and aspirin.

## 2014-02-09 NOTE — Assessment & Plan Note (Signed)
Very stable overall. Not having to use nitroglycerin.

## 2014-02-09 NOTE — Assessment & Plan Note (Signed)
Unfortunately  He is down 1 vein graft. The other remaining vein graft was widely patent along with a LIMA during last catheterization.

## 2014-02-09 NOTE — Assessment & Plan Note (Signed)
Excellent control on current medications. Beta blocker and ACE inhibitor.

## 2014-02-09 NOTE — Assessment & Plan Note (Signed)
He is doing pretty well, he just lost about 9 pounds since his last visit with me. He is trying to adjust his diet and pick up his exercise level.

## 2014-02-09 NOTE — Assessment & Plan Note (Signed)
Recent check in May looked quite good. LDL is almost at goal. Continue with simvastatin , although monitor for myalgias and consider backing off on simvastatin  Dosage.

## 2014-02-09 NOTE — Progress Notes (Signed)
PCP: Shirlee LatchBacigalupo, Angela, MD  Clinic Note: Chief Complaint  Patient presents with  . Follow-up    rov [id number]; no complaints   HPI: Brendan [name] is a 73 y.o. male with a PMH below who presents today for  Six-month follow-up of native and graft CAD with chronic stable angina.    Past Medical History  Diagnosis Date  . CAD in native artery 1997    Referred for CABG x3 in 2004 for LAD diagonal bifurcation lesion  . S/P CABG x 3 2004     LIMA-LAD, SVG to diagonal, SVG to OM  . Unstable angina pectoris  June 2013    Cardiac cath: occluded SVG-DI. Patent LIMA-LAD and SVG-OM. EF 45% with apical inferior HK.  . Coronary atherosclerosis of artery bypass graft June 2013    Occluded SVG-D1; Cardiologist Dr. Herbie BaltimoreHarding  . Hypertension   . Hyperlipidemia LDL goal <70   . Gastroesophageal reflux disease   . Peripheral neuropathy     possible peripheral neuropathy  . Memory loss     Mild  . Gait disorder   . Benign enlargement of prostate   . Anxiety disorder   . Obesity   . Tremor, essential     On Primidone  . Cervical spinal stenosis   . Glaucoma   . Hiatal hernia     GI: Dr Evette CristalGanem  . Complex regional pain syndrome of right upper extremity     Right wrist  . Reflex sympathetic dystrophy     Prior Cardiac Evaluation and  Procedure History: Procedure Laterality Date  . Coronary artery bypass graft  06/2002    LIMA-LAD, SVG-D1, SVG-OM  . Nm myocar perf wall motion  03/23/2009    protocol:Bruce, normal perfusion in all regions, post-stress EF 72%, exercise capacity 7METS, EKG negative for ischemia.  . Transthoracic echocardiogram  April 2014    EF 55-60%; no regional wall motion abnormalities. Relatively normal study normal diastolic parameters/abnormal relaxation   Interval History:  He presents today with no real significant complaint. He just has  His baseline fatigue. He has stable exertional chest tightness and dyspnea if he "overdoes it".   This is pretty much consistent with  what he has had for the last 23 years since his last catheterization. He has not noticed any worsening exertional dyspnea or chest tightness. The main symptom he noted was exertional dyspnea more than tightness. He simply just can't walk very fast. If he does very much he needs to stop to rest. This is partly because of his Parkinson's and partly because of his dyspnea.  He denies any significant edema while on low-dose diuretic. He is been sleeping on a recliner ever since his CABG. Stated that he just simply can't lie down flat without getting short of breath. He really does not know much in the way of PND.  No palpitations, lightheadedness, dizziness, weakness or syncope/near syncope. No TIA/amaurosis fugax symptoms. No melena, hematochezia, hematuria, or epstaxis. No claudication.  ROS: A comprehensive was performed. Review of Systems  Constitutional: Positive for weight loss and malaise/fatigue.        Just feels generally weak and lethargic sometimes.\   in general is not eating as much as he had been, and therefore his lost weight.  HENT: Positive for congestion and nosebleeds.         Seasonal allergies with runny nose and dry eyes  Eyes: Positive for redness.  Respiratory: Positive for shortness of breath. Negative for cough, hemoptysis, wheezing and  stridor.   Cardiovascular: Negative.  Negative for claudication.        Per history of present illness  Gastrointestinal: Positive for heartburn. Negative for blood in stool and melena.  Genitourinary: Negative for hematuria and flank pain.  Musculoskeletal: Positive for joint pain and myalgias.        No recent falls.  Neurological: Positive for dizziness, tremors and weakness. Negative for sensory change, speech change, focal weakness, seizures and loss of consciousness.        Baseline because of his Parkinson-like syndrome.  Endo/Heme/Allergies: Does not bruise/bleed easily.  Psychiatric/Behavioral: Positive for depression. The  patient is not nervous/anxious.   All other systems reviewed and are negative.   Current Outpatient Prescriptions on File Prior to Visit  Medication Sig Dispense Refill  . aspirin (LONGS ADULT LOW STRENGTH ASA) 81 MG EC tablet Take 81 mg by mouth daily.        . fish oil-omega-3 fatty acids 1000 MG capsule Take 1 g by mouth 2 (two) times daily.      . fluticasone (FLONASE) 50 MCG/ACT nasal spray Place 2 sprays into both nostrils daily.  16 g  6  . furosemide (LASIX) 20 MG tablet Take 20 mg by mouth as needed.      . gabapentin (NEURONTIN) 300 MG capsule Take 1 pill PO qAM and take 2 pills PO qhs.  270 capsule  3  . nitroGLYCERIN (NITROSTAT) 0.4 MG SL tablet Place 1 tablet (0.4 mg total) under the tongue every 5 (five) minutes as needed for chest pain. call 911 if chest pain not better  30 tablet  3  . omeprazole (PRILOSEC) 20 MG capsule Take 1 capsule (20 mg total) by mouth daily as needed (acid reflux).  90 capsule  3  . primidone (MYSOLINE) 250 MG tablet Take 1 tablet (250 mg total) by mouth 2 (two) times daily.      . propranolol (INDERAL) 80 MG tablet Take 1 tablet (80 mg total) by mouth 2 (two) times daily.  180 tablet  4  . simvastatin (ZOCOR) 80 MG tablet Take 1 tablet (80 mg total) by mouth at bedtime.  90 tablet  4  . traMADol (ULTRAM) 50 MG tablet Take 1 tablet (50 mg total) by mouth 3 (three) times daily as needed.  90 tablet  2   Current Facility-Administered Medications on File Prior to Visit  Medication Dose Route Frequency Provider Last Rate Last Dose  . pneumococcal 23 valent vaccine (PNU-IMMUNE) injection 0.5 mL  0.5 mL Intramuscular Once Charm RingsErin J Honig, MD       ALLERGIES REVIEWED IN EPIC --  no change SOCIAL AND FAMILY HISTORY REVIEWED IN EPIC --  no change  Wt Readings from Last 3 Encounters:  02/02/14 189 lb 11.2 oz (86.047 kg)  01/12/14 198 lb 14.4 oz (90.22 kg)  08/21/13 198 lb (89.812 kg)   PHYSICAL EXAM BP 120/66  Pulse 51  Ht 5\' 7"  (1.702 m)  Wt 189 lb 11.2  oz (86.047 kg)  BMI 29.70 kg/m2 General appearance: alert, cooperative, appears stated age, NAD; and mildly obese; well-nourished, well-groomed. Answers questions appropriately. Skin baseline tremor of left hand that shakes his whole shoulder and head  HEENT: [place]/AT, EOMI, MMM, anicteric sclera  Neck: no adenopathy, no carotid bruit and no JVD;  Lungs: CTA B., normal percussion bilaterally and non-labored  Heart: RRR, S1, S2 normal, no murmur, click, rub or gallop; nondisplaced PMI  Abdomen: soft, non-tender; bowel sounds normal; no masses, no organomegaly;  Extremities:  No C/C/ with trace E.Pulses: 2+ and symmetric;  Skin: Mild venous stasis changes found.  Neurologic: Alert, oriented, thought content appropriate; Cranial nerves: normal (II-XII grossly intact)   Adult ECG Report  Rate:  51 ;  Rhythm: sinus bradycardia; Borderline incomplete RBBB, LAFB  Narrative Interpretation:  Stable EKG findings  Recent Labs:    Lab Results  Component Value Date   CHOL 136 08/21/2013   HDL 34* 08/21/2013   LDLCALC 83 08/21/2013   LDLDIRECT 84 08/30/2006   TRIG 95 08/21/2013   CHOLHDL 4.0 08/21/2013     Chemistry      Component Value Date/Time   NA 139 01/12/2014 0948   NA 142 11/27/2012 1133   K 4.7 01/12/2014 0948   CL 102 01/12/2014 0948   CO2 28 01/12/2014 0948   BUN 15 01/12/2014 0948   BUN 13 11/27/2012 1133   CREATININE 1.05 01/12/2014 0948   CREATININE 1.13 11/27/2012 1133      Component Value Date/Time   CALCIUM 9.4 01/12/2014 0948   ALKPHOS 106 01/12/2014 0948   AST 13 01/12/2014 0948   ALT 13 01/12/2014 0948   BILITOT 0.6 01/12/2014 0948      ASSESSMENT / PLAN:   Overall stable gentleman with known CAD - both native and vein graft.  No medication changes.   Atherosclerotic heart disease of native coronary artery with angina pectoris  Severe native CAD with CABG and known occlusion of the SVG-diagonal. He does have mild chronic stable exertional dyspnea /angina. No worsening of symptoms. He  is on Inderal which helps his tremors as well as ACE inhibitor, statin and aspirin.  Atherosclerotic heart disease of artery bypass graft  Unfortunately  He is down 1 vein graft. The other remaining vein graft was widely patent along with a LIMA during last catheterization.  CHRONIC STABLE ANGINA  Very stable overall. Not having to use nitroglycerin.  HYPERTENSION, BENIGN  Excellent control on current medications. Beta blocker and ACE inhibitor.  Hyperlipidemia LDL goal <70  Recent check in May looked quite good. LDL is almost at goal. Continue with simvastatin , although monitor for myalgias and consider backing off on simvastatin  Dosage.  Obesity (BMI 30-39.9)  He is doing pretty well, he just lost about 9 pounds since his last visit with me. He is trying to adjust his diet and pick up his exercise level.    Orders Placed This Encounter  Procedures  . EKG 12-Lead   No orders of the defined types were placed in this encounter.    Followup:  Six-month   Marykay Lex, M.D., M.S. Interventional Cardiologist   Pager # [phone]

## 2014-02-16 ENCOUNTER — Encounter: Payer: Self-pay | Admitting: Internal Medicine

## 2014-03-26 ENCOUNTER — Encounter (HOSPITAL_COMMUNITY): Payer: Self-pay | Admitting: Cardiology

## 2014-04-20 ENCOUNTER — Ambulatory Visit: Payer: Medicare Other | Admitting: Internal Medicine

## 2014-04-20 ENCOUNTER — Telehealth: Payer: Self-pay | Admitting: Internal Medicine

## 2014-04-21 NOTE — Telephone Encounter (Signed)
Patient cancelled because of illness.  No charge

## 2014-06-16 ENCOUNTER — Encounter: Payer: Self-pay | Admitting: Family Medicine

## 2014-06-16 ENCOUNTER — Ambulatory Visit (INDEPENDENT_AMBULATORY_CARE_PROVIDER_SITE_OTHER): Payer: BLUE CROSS/BLUE SHIELD | Admitting: Family Medicine

## 2014-06-16 VITALS — BP 130/78 | HR 54 | Temp 98.0°F | Ht 67.0 in | Wt 196.0 lb

## 2014-06-16 DIAGNOSIS — G9050 Complex regional pain syndrome I, unspecified: Secondary | ICD-10-CM

## 2014-06-16 DIAGNOSIS — G252 Other specified forms of tremor: Secondary | ICD-10-CM

## 2014-06-16 DIAGNOSIS — I10 Essential (primary) hypertension: Secondary | ICD-10-CM

## 2014-06-16 DIAGNOSIS — IMO0002 Reserved for concepts with insufficient information to code with codable children: Secondary | ICD-10-CM

## 2014-06-16 DIAGNOSIS — R251 Tremor, unspecified: Secondary | ICD-10-CM

## 2014-06-16 DIAGNOSIS — G250 Essential tremor: Secondary | ICD-10-CM

## 2014-06-16 LAB — POCT GLYCOSYLATED HEMOGLOBIN (HGB A1C): Hemoglobin A1C: 5.4

## 2014-06-16 MED ORDER — LISINOPRIL 10 MG PO TABS: 10.0000 mg | ORAL_TABLET | Freq: Every day | ORAL | Status: DC

## 2014-06-16 MED ORDER — PROPRANOLOL HCL 80 MG PO TABS
80.0000 mg | ORAL_TABLET | Freq: Two times a day (BID) | ORAL | Status: DC
Start: 2014-06-16 — End: 2014-11-12

## 2014-06-16 MED ORDER — SIMVASTATIN 80 MG PO TABS: 80.0000 mg | ORAL_TABLET | Freq: Every day | ORAL | Status: DC

## 2014-06-16 MED ORDER — TRAMADOL HCL 50 MG PO TABS: 50.0000 mg | ORAL_TABLET | Freq: Three times a day (TID) | ORAL | Status: DC | PRN

## 2014-06-16 MED ORDER — GABAPENTIN 300 MG PO CAPS: 300.0000 mg | ORAL_CAPSULE | Freq: Two times a day (BID) | ORAL | Status: DC

## 2014-06-16 MED ORDER — PRIMIDONE 250 MG PO TABS: 250.0000 mg | ORAL_TABLET | Freq: Two times a day (BID) | ORAL | Status: DC

## 2014-06-16 NOTE — Assessment & Plan Note (Signed)
Currently well controlled. Continue current medication regimen. Lisinopril 10 mg daily and propranolol 80 mg twice a day. F/u in 6 months

## 2014-06-16 NOTE — Patient Instructions (Addendum)
It was nice to see you again today. Continue to take your medicines as prescribed. I will get a lab check for diabetes today and call you about the results in a few days. I will see you back in about 6 months for another checkup.  Take care, Dr. Leonard SchwartzB  Complex Regional Pain Syndrome Complex Regional Pain Syndrome (CRPS) is a nerve disorder that occurs at the site of an injury. It occurs especially after injuries from high-velocity impacts such as those from bullets or shrapnel. However, it may occur without apparent injury. The arms or legs are usually involved. SYMPTOMS  CRPS is a chronic condition characterized by:  Severe burning pain.  Changes in bone and skin.  Excessive sweating.  Tissue swelling.  Extreme sensitivity to touch. One visible sign of CRPS near the site of injury is warm, shiny, red skin that later becomes cool and bluish. The pain that patients report is out of proportion to the severity of the injury. The pain gets worse, rather than better, over time. Eventually the joints become stiff from disuse and the skin, muscles, and bone atrophy. The symptoms of CRPS vary in severity and duration. The cause of CRPS is unknown. The disorder is unique in that it simultaneously affects the:  Nerves.  Skin.  Muscles.  Blood vessels.  Bones. CRPS can strike at any age but is more common between the ages of 6340 and 4260. However, the number of CRPS cases among adolescents and young adults is increasing. CRPS is diagnosed primarily through observation of the symptoms. Some physicians use thermography to detect changes in body temperature that are common in CRPS. X-rays may also show changes in the bone.  TREATMENT  Treatments include:  Physicians use a variety of drugs to treat CRPS.  Elevation of the extremity and physical therapy are also used to treat CRPS.  Injection of a local anesthetic.  Applying brief pulses of electricity to nerve endings under the skin (transcutaneous  electrical stimulation or TENS).  In some cases, interruption of the affected portion of the sympathetic nervous system (surgical or chemical sympathectomy) is necessary to relieve pain. This involves cutting the nerve or nerves. Pain is destroyed almost instantly, but this treatment may also destroy other sensations. PROGNOSIS Good progress can be made in treating CRPS if treatment is begun early. Ideally treatment should begin within 3 months of the first symptoms. Early treatment often results in remission. If treatment is delayed, the disorder can quickly spread to the entire limb, and changes in bone and muscle may become irreversible. In 50 percent of CRPS cases, pain persists longer than 6 months and sometimes for years. Document Released: 03/24/2002 Document Revised: 06/26/2011 Document Reviewed: 02/12/2008 [facility] Patient Information 2015 [place]. This information is not intended to replace advice given to you by your health care provider. Make sure you discuss any questions you have with your health care provider.

## 2014-06-16 NOTE — Assessment & Plan Note (Signed)
Stable Continue propranolol and primidone

## 2014-06-16 NOTE — Progress Notes (Signed)
   Subjective:   Brendan [name] is a  74 y.o. male with a history of HTN, HLD, complex regional pain syndrome, and essential tremor here for "check-up" and med refill.  Complex regional pain syndrome: .pain is currently well controlled with tramadol 50 mg twice a day when necessary and gabapentin 300 mg twice a day.  Pain occasionally worse in AM but resolves after taking tramadol.  HTN: Taking lisinopril 10 mg daily and propranolol 80 mg twice a day. Denies any medication side effects. Denies any chest pain, shortness breath, headaches. Is not checking his blood pressure at home.  Tremor: Reports that tremor is stable.  Was put on primidone and propranolol by neurology and told that it would not get any better than it currently is. Does not want to go back to neurology.  Review of Systems:  Per HPI. All other systems reviewed and are negative.   PMH, PSH, Medications, Allergies, and FmHx reviewed and updated in EMR.  Social History: former smoker  Objective:  BP 130/78 mmHg  Pulse 54  Temp(Src) 98 F (36.7 C) (Oral)  Ht 5\' 7"  (1.702 m)  Wt 196 lb (88.905 kg)  BMI 30.69 kg/m2  Gen:  74 y.o. male in NAD, resting tremor present HEENT: NCAT, MMM, EOMI, PERRL, anicteric sclerae CV: RRR, no MRG, no JVD Resp: Non-labored, CTAB, no wheezes noted Ext: WWP, no edema Neuro: Alert and oriented, speech normal      Chemistry      Component Value Date/Time   NA 139 01/12/2014 0948   NA 142 11/27/2012 1133   K 4.7 01/12/2014 0948   CL 102 01/12/2014 0948   CO2 28 01/12/2014 0948   BUN 15 01/12/2014 0948   BUN 13 11/27/2012 1133   CREATININE 1.05 01/12/2014 0948   CREATININE 1.13 11/27/2012 1133      Component Value Date/Time   CALCIUM 9.4 01/12/2014 0948   ALKPHOS 106 01/12/2014 0948   AST 13 01/12/2014 0948   ALT 13 01/12/2014 0948   BILITOT 0.6 01/12/2014 0948      Lab Results  Component Value Date   WBC 5.8 01/12/2014   HGB 14.3 01/12/2014   HCT 41.5 01/12/2014   MCV  98.3 01/12/2014   PLT 152 01/12/2014    Lab Results  Component Value Date   HGBA1C 5.4 12/05/2011   Assessment:     Brendan [name] is a 74 y.o. male here for f/u of chronic issues and med refills.    Plan:     See problem list for problem-specific plans.   Shirlee LatchAngela Anvi Mangal, MD PGY-1,  [facility] 06/16/2014  10:42 AM

## 2014-06-16 NOTE — Assessment & Plan Note (Signed)
Pain currently well controlled. Continue tramadol 50 mg twice a day when necessary Continue gabapentin 300 mg twice a day.  follow-up in 6 months

## 2014-08-13 ENCOUNTER — Ambulatory Visit (INDEPENDENT_AMBULATORY_CARE_PROVIDER_SITE_OTHER): Payer: Medicare Other | Admitting: Cardiology

## 2014-08-13 ENCOUNTER — Encounter: Payer: Self-pay | Admitting: Cardiology

## 2014-08-13 VITALS — BP 130/70 | HR 58 | Ht 66.0 in | Wt 196.9 lb

## 2014-08-13 DIAGNOSIS — I208 Other forms of angina pectoris: Secondary | ICD-10-CM

## 2014-08-13 DIAGNOSIS — E785 Hyperlipidemia, unspecified: Secondary | ICD-10-CM | POA: Diagnosis not present

## 2014-08-13 DIAGNOSIS — IMO0002 Reserved for concepts with insufficient information to code with codable children: Secondary | ICD-10-CM

## 2014-08-13 DIAGNOSIS — E669 Obesity, unspecified: Secondary | ICD-10-CM

## 2014-08-13 DIAGNOSIS — I10 Essential (primary) hypertension: Secondary | ICD-10-CM

## 2014-08-13 DIAGNOSIS — G9050 Complex regional pain syndrome I, unspecified: Secondary | ICD-10-CM

## 2014-08-13 DIAGNOSIS — I25708 Atherosclerosis of coronary artery bypass graft(s), unspecified, with other forms of angina pectoris: Secondary | ICD-10-CM

## 2014-08-13 DIAGNOSIS — I2089 Other forms of angina pectoris: Secondary | ICD-10-CM

## 2014-08-13 DIAGNOSIS — I25119 Atherosclerotic heart disease of native coronary artery with unspecified angina pectoris: Secondary | ICD-10-CM | POA: Diagnosis not present

## 2014-08-13 DIAGNOSIS — Z951 Presence of aortocoronary bypass graft: Secondary | ICD-10-CM

## 2014-08-13 NOTE — Patient Instructions (Signed)
NO CHANGE IN CURRENT MEDICATIONS  Your physician wants you to follow-up in 6 MONTH DR HARDING.  You will receive a reminder letter in the mail two months in advance. If you don't receive a letter, please call our office to schedule the follow-up appointment.  

## 2014-08-13 NOTE — Progress Notes (Signed)
PCP: Shirlee LatchBacigalupo, Angela, MD  Clinic Note: Chief Complaint  Patient presents with  . Follow-up     bilateral leg pain,no concerns  . Coronary Artery Disease    History of CABG; now with occluded SVG-P1 and patent LIMA and LAD, SV NLM   HPI: Brendan [name] is a 74 y.o. male with a PMH below who presents today for  Six-month follow-up of native and graft CAD with chronic stable angina.    Past Medical History  Diagnosis Date  . CAD in native artery 1997    Referred for CABG x3 in 2004 for LAD diagonal bifurcation lesion  . S/P CABG x 3 2004     LIMA-LAD, SVG to diagonal, SVG to OM  . Unstable angina pectoris  June 2013    Cardiac cath: occluded SVG-DI. Patent LIMA-LAD and SVG-OM. EF 45% with apical inferior HK.  . Coronary atherosclerosis of artery bypass graft June 2013    Occluded SVG-D1; Cardiologist Dr. Herbie BaltimoreHarding  . Hypertension   . Hyperlipidemia LDL goal <70   . Gastroesophageal reflux disease   . Peripheral neuropathy     possible peripheral neuropathy  . Memory loss     Mild  . Gait disorder   . Benign enlargement of prostate   . Anxiety disorder   . Obesity   . Tremor, essential     On Primidone  . Cervical spinal stenosis   . Glaucoma   . Hiatal hernia     GI: Dr Evette CristalGanem  . Complex regional pain syndrome of right upper extremity     Right wrist; L arm  . Reflex sympathetic dystrophy     Prior Cardiac Evaluation and  Procedure History: Procedure Laterality Date  . Coronary artery bypass graft  06/2002    LIMA-LAD, SVG-D1, SVG-OM  . Nm myocar perf wall motion  03/23/2009    protocol:Bruce, normal perfusion in all regions, post-stress EF 72%, exercise capacity 7METS, EKG negative for ischemia.  . Transthoracic echocardiogram  April 2014    EF 55-60%; no regional wall motion abnormalities. Relatively normal study normal diastolic parameters/abnormal relaxation   Interval History:  He presents today with no real significant complaint. Can't do a lot of activity  - can walk around ok - but gets tired with legs & feet hurting --> goes in & sits down then falls asleep. Falls asleep watching TV.  Can't usually use his hands to do much b/c nerve issues. Has to sleep in a recliner b/c back pain.  Occasionally feels that he get a bit sob lying flat.  He still has has baseline fatigue. He has stable exertional chest tightness and dyspnea if he "overdoes it" - but it is much less likely to "overdo it "over the last few months since the because of worsening tremor and neurologic issues.   This is pretty much consistent with what he has had for the last few years since his last catheterization. He has not noticed any worsening exertional dyspnea or chest tightness. The main symptom he notes is exertional dyspnea more than tightness. He simply just can't walk very fast. If he does very much he needs to stop to rest. This is partly because of his neurologic condition and partly because of his dyspnea.  He denies any significant edema while on low-dose diuretic -- Only takes PRN Lasix 1-2 x / month..  He is been sleeping on a recliner ever since his CABG. Stated that he just simply can't lie down flat without getting  short of breath. He really does not know much in the way of PND 4 orthopnea.  Remainder of his Cardiac review of systems: No rapid, irregular heartbeat/palpitations. No syncope/near syncope, TIA/amaurosis fugax symptoms. No melena, hematochezia, hematuria, or epstaxis. No claudication.  ROS: A comprehensive was performed. Review of Systems  Constitutional: Positive for malaise/fatigue (Poor exercise tolerance).  HENT: Negative for congestion and nosebleeds.   Cardiovascular: Negative for leg swelling.       Per HPI  Gastrointestinal: Positive for heartburn (GERD). Negative for constipation, blood in stool and melena.  Genitourinary: Positive for frequency (nocturia ). Negative for hematuria.  Musculoskeletal: Positive for back pain and joint pain  (knees).  Neurological: Positive for tremors (getting worse & not better - limits ability to use hands) and weakness.  Psychiatric/Behavioral: Negative for depression.       Excessively sleepy & tired  All other systems reviewed and are negative.   Current Outpatient Prescriptions on File Prior to Visit  Medication Sig Dispense Refill  . aspirin (LONGS ADULT LOW STRENGTH ASA) 81 MG EC tablet Take 81 mg by mouth daily.      . fish oil-omega-3 fatty acids 1000 MG capsule Take 1 g by mouth 2 (two) times daily.    . furosemide (LASIX) 20 MG tablet Take 20 mg by mouth as needed.    . gabapentin (NEURONTIN) 300 MG capsule Take 1 capsule (300 mg total) by mouth 2 (two) times daily. 60 capsule 5  . lisinopril (PRINIVIL,ZESTRIL) 10 MG tablet Take 1 tablet (10 mg total) by mouth daily. 30 tablet 5  . nitroGLYCERIN (NITROSTAT) 0.4 MG SL tablet Place 1 tablet (0.4 mg total) under the tongue every 5 (five) minutes as needed for chest pain. call 911 if chest pain not better 30 tablet 3  . omeprazole (PRILOSEC) 20 MG capsule Take 1 capsule (20 mg total) by mouth daily as needed (acid reflux). 90 capsule 3  . primidone (MYSOLINE) 250 MG tablet Take 1 tablet (250 mg total) by mouth 2 (two) times daily. 60 tablet 5  . propranolol (INDERAL) 80 MG tablet Take 1 tablet (80 mg total) by mouth 2 (two) times daily. 60 tablet 5  . simvastatin (ZOCOR) 80 MG tablet Take 1 tablet (80 mg total) by mouth at bedtime. 30 tablet 11  . traMADol (ULTRAM) 50 MG tablet Take 1 tablet (50 mg total) by mouth 3 (three) times daily as needed. 90 tablet 2   Current Facility-Administered Medications on File Prior to Visit  Medication Dose Route Frequency Provider Last Rate Last Dose  . 0.9 %  sodium chloride infusion   Intravenous Continuous Marykay Lex, MD      . pneumococcal 23 valent vaccine (PNU-IMMUNE) injection 0.5 mL  0.5 mL Intramuscular Once Charm Rings, MD      . sodium chloride 0.9 % injection 3 mL  3 mL Intravenous  PRN Marykay Lex, MD       No Known Allergies History  Substance Use Topics  . Smoking status: Former Smoker -- 3.00 packs/day for 20 years    Types: Cigarettes    Quit date: 04/17/1968  . Smokeless tobacco: Never Used  . Alcohol Use: Yes     Comment: Occasional 2 mixed drinks or 3 beers.   Family History  Problem Relation Age of Onset  . Heart disease Mother   . Heart disease Brother   . Tremor Paternal Uncle   . Heart disease Brother     Wt Readings from  Last 3 Encounters:  08/13/14 196 lb 14.4 oz (89.313 kg)  06/16/14 196 lb (88.905 kg)  02/02/14 189 lb 11.2 oz (86.047 kg)   PHYSICAL EXAM BP 130/70 mmHg  Pulse 58  Ht  (1.676 m)  Wt 196 lb 14.4 oz (89.313 kg)  BMI 31.80 kg/m2 General appearance: alert, cooperative, appears stated age, NAD; and mildly obese; well-nourished, well-groomed. Answers questions appropriately. Significant baseline tremor of left hand tless prominent right hand)hat shakes his whole shoulder and head  HEENT: [place]/AT, EOMI, MMM, anicteric sclera  Neck: no adenopathy, no carotid bruit and no JVD;  Lungs: CTA B., normal percussion bilaterally and non-labored  Heart: RRR, S1, S2 normal, no murmur, click, rub or gallop; nondisplaced PMI  Abdomen: soft, non-tender; bowel sounds normal; no masses, no organomegaly;  Extremities:  No C/C/ with trace E.Pulses: 2+ and symmetric;  Skin: Mild venous stasis changes found.  Neurologic: Alert, oriented, thought content appropriate; Cranial nerves: normal (II-XII grossly intact)   Adult ECG Report  Rate:  58 ;  Rhythm: sinus bradycardia; Borderline incomplete RBBB, LAFB  Narrative Interpretation:  Stable EKG findings  Recent Labs:   Followed by PCP Lab Results  Component Value Date   CHOL 136 08/21/2013   HDL 34* 08/21/2013   LDLCALC 83 08/21/2013   LDLDIRECT 84 08/30/2006   TRIG 95 08/21/2013   CHOLHDL 4.0 08/21/2013    ASSESSMENT / PLAN:   Overall stable gentleman with known CAD - both native  and vein graft.  No medication changes.   Problem List Items Addressed This Visit    Atherosclerotic heart disease of artery bypass graft (Chronic)    He does have loss of one vein graft which branch. Other grafts remained patent.  Only has mild anginal symptoms with overexertion but is not able to do anymore. Has not had an ischemic evaluation since his last cath. Would consider surveillance Myoview next year.      Atherosclerotic heart disease of native coronary artery with angina pectoris - Primary (Chronic)    He does have severe native vessel CAD now with CABG and occluded SVG to diagonal. Probably a small branch not all that significant. He is activity level is really so limited that he really denies any anginal symptoms. He is on Inderal for his tremors as well as an ACE inhibitor, statin and aspirin. As he remained stable. No change to his regimen..      Relevant Orders   EKG 12-Lead (Completed)   CHRONIC STABLE ANGINA (Chronic)   Relevant Orders   EKG 12-Lead (Completed)   Complex regional pain syndrome    Pain seems to be controlled, but is very limiting as far as any physical activity.      Relevant Orders   EKG 12-Lead (Completed)   Hyperlipidemia LDL goal <70 (Chronic)    Last set of labs were from last May & are usually monitored by his PCP. He denies worsening myalgia symptoms despite being on 80 mg of simvastatin.      Relevant Orders   EKG 12-Lead (Completed)   HYPERTENSION, BENIGN (Chronic)    Well-controlled on current medications. Only on moderate dose ACE inhibitor with when necessary Lasix.      Relevant Orders   EKG 12-Lead (Completed)   Obesity (BMI 30-39.9) (Chronic)    Unfortunately, he gained back the weight that he lost. He is limited as far as any type of physical activity because of his neurologic issues. We discussed the importance of dietary modification.  Relevant Orders   EKG 12-Lead (Completed)   S/P CABG x 3 (Chronic)   Relevant  Orders   EKG 12-Lead (Completed)       Followup:  Six-month   HARDING, Piedad Climes, M.D., M.S. Interventional Cardiologist   Pager # [phone]

## 2014-08-15 ENCOUNTER — Encounter: Payer: Self-pay | Admitting: Cardiology

## 2014-08-15 NOTE — Assessment & Plan Note (Signed)
Last set of labs were from last May & are usually monitored by his PCP. He denies worsening myalgia symptoms despite being on 80 mg of simvastatin.

## 2014-08-15 NOTE — Assessment & Plan Note (Signed)
Unfortunately, he gained back the weight that he lost. He is limited as far as any type of physical activity because of his neurologic issues. We discussed the importance of dietary modification.

## 2014-08-15 NOTE — Assessment & Plan Note (Signed)
Well-controlled on current medications. Only on moderate dose ACE inhibitor with when necessary Lasix.

## 2014-08-15 NOTE — Assessment & Plan Note (Signed)
Pain seems to be controlled, but is very limiting as far as any physical activity.

## 2014-08-15 NOTE — Assessment & Plan Note (Signed)
He does have severe native vessel CAD now with CABG and occluded SVG to diagonal. Probably a small branch not all that significant. He is activity level is really so limited that he really denies any anginal symptoms. He is on Inderal for his tremors as well as an ACE inhibitor, statin and aspirin. As he remained stable. No change to his regimen..[name]

## 2014-08-15 NOTE — Assessment & Plan Note (Addendum)
He does have loss of one vein graft which branch. Other grafts remained patent.  Only has mild anginal symptoms with overexertion but is not able to do anymore. Has not had an ischemic evaluation since his last cath. Would consider surveillance Myoview next year.

## 2014-08-26 ENCOUNTER — Encounter: Payer: Self-pay | Admitting: Critical Care Medicine

## 2014-08-26 ENCOUNTER — Ambulatory Visit (INDEPENDENT_AMBULATORY_CARE_PROVIDER_SITE_OTHER): Payer: Medicare Other | Admitting: Critical Care Medicine

## 2014-08-26 VITALS — BP 102/82 | Temp 99.1°F | Ht 66.0 in | Wt 197.0 lb

## 2014-08-26 DIAGNOSIS — J920 Pleural plaque with presence of asbestos: Secondary | ICD-10-CM

## 2014-08-26 NOTE — Patient Instructions (Signed)
A CT of the chest will be obtained, we will call results No change in medications Return 1 year

## 2014-08-26 NOTE — Assessment & Plan Note (Signed)
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 Significant asbestos exposure over 40 years ago in the workplace. No evidence of mesothelioma onCT scan 08/21/2013 Need repeat CT chest  No evidence for asbestosis on exam or by hx Plan F/u CT chest non contrast No other med changes

## 2014-08-26 NOTE — Progress Notes (Signed)
Subjective:    Patient ID: Brendan [name], male    DOB: 10/09/1940, 74 y.o.   MRN: [id number]  HPI 08/26/2014 Chief Complaint  Patient presents with  . Follow-up    Pt c/o occasional dry cough and reflux. Denies any chest tightness/congestion.     occ cough is dry. Mild reflux.  No chest pain .   Here for f/u asbestos pleural dz. Needs f/u CT chest .  Pt denies any significant sore throat, nasal congestion or excess secretions, fever, chills, sweats, unintended weight loss, pleurtic or exertional chest pain, orthopnea PND, or leg swelling Pt denies any increase in rescue therapy over baseline, denies waking up needing it or having any early am or nocturnal exacerbations of coughing/wheezing/or dyspnea. Pt also denies any obvious fluctuation in symptoms with  weather or environmental change or other alleviating or aggravating factors   Current Medications, Allergies, Complete Past Medical History, Past Surgical History, Family History, and Social History were reviewed in [facility] electronic medical record.  Past Medical History  Diagnosis Date  . CAD in native artery 1997    Referred for CABG x3 in 2004 for LAD diagonal bifurcation lesion  . S/P CABG x 3 2004     LIMA-LAD, SVG to diagonal, SVG to OM  . Unstable angina pectoris  June 2013    Cardiac cath: occluded SVG-DI. Patent LIMA-LAD and SVG-OM. EF 45% with apical inferior HK.  . Coronary atherosclerosis of artery bypass graft June 2013    Occluded SVG-D1; Cardiologist Dr. Herbie BaltimoreHarding  . Hypertension   . Hyperlipidemia LDL goal <70   . Gastroesophageal reflux disease   . Peripheral neuropathy     possible peripheral neuropathy  . Memory loss     Mild  . Gait disorder   . Benign enlargement of prostate   . Anxiety disorder   . Obesity   . Tremor, essential     On Primidone  . Cervical spinal stenosis   . Glaucoma   . Hiatal hernia     GI: Dr Evette CristalGanem  . Complex regional pain syndrome of right upper extremity    Right wrist; L arm  . Reflex sympathetic dystrophy      Family History  Problem Relation Age of Onset  . Heart disease Mother   . Heart disease Brother   . Tremor Paternal Uncle   . Heart disease Brother      History   Social History  . Marital Status: Married    Spouse Name: N/A  . Number of Children: N/A  . Years of Education: N/A   Occupational History  . Not on file.   Social History Main Topics  . Smoking status: Former Smoker -- 3.00 packs/day for 20 years    Types: Cigarettes    Quit date: 04/17/1968  . Smokeless tobacco: Never Used  . Alcohol Use: Yes     Comment: Occasional 2 mixed drinks or 3 beers.  . Drug Use: No  . Sexual Activity: Not on file   Other Topics Concern  . Not on file   Social History Narrative   Married.  Wife is Royal PiedraMary Reesor.   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   Former smoker, quit 20 years ago. Does not drink alcohol.   Does not get routine exercise, walks sometimes with a cane.     No Known Allergies   Outpatient Prescriptions Prior to Visit  Medication Sig Dispense Refill  . aspirin (LONGS ADULT LOW STRENGTH ASA) 81 MG  EC tablet Take 81 mg by mouth daily.      . fish oil-omega-3 fatty acids 1000 MG capsule Take 1 g by mouth 2 (two) times daily.    . furosemide (LASIX) 20 MG tablet Take 20 mg by mouth as needed.    . gabapentin (NEURONTIN) 300 MG capsule Take 1 capsule (300 mg total) by mouth 2 (two) times daily. 60 capsule 5  . lisinopril (PRINIVIL,ZESTRIL) 10 MG tablet Take 1 tablet (10 mg total) by mouth daily. 30 tablet 5  . nitroGLYCERIN (NITROSTAT) 0.4 MG SL tablet Place 1 tablet (0.4 mg total) under the tongue every 5 (five) minutes as needed for chest pain. call 911 if chest pain not better 30 tablet 3  . omeprazole (PRILOSEC) 20 MG capsule Take 1 capsule (20 mg total) by mouth daily as needed (acid reflux). 90 capsule 3  . primidone (MYSOLINE) 250 MG tablet Take 1 tablet (250 mg total) by mouth 2 (two) times  daily. 60 tablet 5  . propranolol (INDERAL) 80 MG tablet Take 1 tablet (80 mg total) by mouth 2 (two) times daily. 60 tablet 5  . simvastatin (ZOCOR) 80 MG tablet Take 1 tablet (80 mg total) by mouth at bedtime. 30 tablet 11  . traMADol (ULTRAM) 50 MG tablet Take 1 tablet (50 mg total) by mouth 3 (three) times daily as needed. 90 tablet 2   Facility-Administered Medications Prior to Visit  Medication Dose Route Frequency Provider Last Rate Last Dose  . 0.9 %  sodium chloride infusion   Intravenous Continuous Marykay Lexavid W Harding, MD      . pneumococcal 23 valent vaccine (PNU-IMMUNE) injection 0.5 mL  0.5 mL Intramuscular Once Charm RingsErin J Honig, MD      . sodium chloride 0.9 % injection 3 mL  3 mL Intravenous PRN Marykay Lexavid W Harding, MD             Review of Systems  Constitutional: Negative for fever, chills, diaphoresis, activity change, appetite change, fatigue and unexpected weight change.  HENT: Negative for congestion, dental problem, drooling, ear discharge, ear pain, facial swelling, hearing loss, mouth sores, nosebleeds, postnasal drip, rhinorrhea, sinus pressure, sneezing, sore throat, tinnitus, trouble swallowing and voice change.   Eyes: Negative for photophobia, discharge, itching and visual disturbance.  Respiratory: Positive for shortness of breath. Negative for apnea, cough, choking, chest tightness, wheezing and stridor.   Cardiovascular: Negative for chest pain, palpitations and leg swelling.  Gastrointestinal: Negative for nausea, vomiting, abdominal pain, constipation, blood in stool and abdominal distention.  Genitourinary: Negative for dysuria, urgency, frequency, hematuria, flank pain, decreased urine volume and difficulty urinating.  Musculoskeletal: Negative for myalgias, back pain, joint swelling, arthralgias, gait problem, neck pain and neck stiffness.  Skin: Negative for color change, pallor and rash.  Neurological: Positive for tremors. Negative for dizziness, seizures,  syncope, speech difficulty, weakness, light-headedness, numbness and headaches.  Hematological: Negative for adenopathy. Does not bruise/bleed easily.  Psychiatric/Behavioral: Negative for confusion, sleep disturbance and agitation. The patient is not nervous/anxious.        Objective:   Physical Exam  Constitutional: He appears well-developed and well-nourished. He is active.  Non-toxic appearance. He does not appear ill. No distress.  HENT:  Head: Normocephalic and atraumatic.  Nose: No mucosal edema, rhinorrhea, sinus tenderness, nasal deformity or septal deviation. No epistaxis. Right sinus exhibits no maxillary sinus tenderness and no frontal sinus tenderness. Left sinus exhibits no maxillary sinus tenderness and no frontal sinus tenderness.  Mouth/Throat: Oropharynx is clear and  moist. No oropharyngeal exudate.  Eyes: Conjunctivae and EOM are normal. Pupils are equal, round, and reactive to light. Right eye exhibits no discharge. Left eye exhibits no discharge. No scleral icterus.  Neck: Normal range of motion. Neck supple. Normal carotid pulses, no hepatojugular reflux and no JVD present. No tracheal tenderness and no muscular tenderness present. Carotid bruit is not present. No rigidity. No tracheal deviation, no edema, no erythema and normal range of motion present. No thyroid mass and no thyromegaly present.  Cardiovascular: Normal rate, regular rhythm, S1 normal, S2 normal, normal heart sounds, intact distal pulses and normal pulses.  PMI is not displaced.  Exam reveals no gallop, no S3, no S4, no distant heart sounds and no friction rub.   No murmur heard.  No systolic murmur is present   No diastolic murmur is present  Pulmonary/Chest: No accessory muscle usage or stridor. No apnea and no tachypnea. No respiratory distress. He has decreased breath sounds. He has no wheezes. He has no rhonchi. He has no rales. Chest wall is not dull to percussion. He exhibits no mass, no tenderness, no  bony tenderness and no deformity.  Abdominal: Soft. Normal appearance and bowel sounds are normal. He exhibits no distension, no ascites and no mass. There is no hepatosplenomegaly. There is no tenderness. There is no rigidity, no rebound, no guarding and no CVA tenderness.  Musculoskeletal: Normal range of motion.  Lymphadenopathy:       Head (right side): No submental and no submandibular adenopathy present.       Head (left side): No submental and no submandibular adenopathy present.    He has no cervical adenopathy.    He has no axillary adenopathy.  Neurological: He is alert. He has normal strength and normal reflexes. No cranial nerve deficit or sensory deficit.  Resting tremor  Skin: Skin is warm and dry. No bruising, no ecchymosis, no lesion and no rash noted. He is not diaphoretic. No cyanosis or erythema. No pallor. Nails show no clubbing.  Psychiatric: He has a normal mood and affect. His speech is normal and behavior is normal.  Vitals reviewed.     Assessment & Plan:  I personally reviewed all images and lab data in the [facility] system as well as any outside material available during this office visit and agree with the  radiology impressions.  I also have reviewed any data /notes/records if available in care everywhere.  Pleural plaque with presence of asbestos Asbestos related pleural disease with pleural plaquing and calcifications. Significant asbestos exposure over 40 years ago in the workplace. No evidence of mesothelioma onCT scan 08/21/2013 Need repeat CT chest  No evidence for asbestosis on exam or by hx Plan F/u CT chest non contrast No other med changes    Juleon was seen today for follow-up.  Diagnoses and all orders for this visit:  Pleural plaque with presence of asbestos Orders: -     CT Chest Wo Contrast; Future   I

## 2014-08-31 ENCOUNTER — Ambulatory Visit (INDEPENDENT_AMBULATORY_CARE_PROVIDER_SITE_OTHER)
Admission: RE | Admit: 2014-08-31 | Discharge: 2014-08-31 | Disposition: A | Discharge status: Home / Self Care | Payer: Medicare Other | Source: Ambulatory Visit | Attending: Critical Care Medicine | Admitting: Critical Care Medicine

## 2014-08-31 DIAGNOSIS — J920 Pleural plaque with presence of asbestos: Secondary | ICD-10-CM | POA: Diagnosis not present

## 2014-09-01 NOTE — Progress Notes (Signed)
Quick Note:  Spoke with pt's wife. Discussed CT Chest results and recs per Dr. Delford FieldWright. She verbalized understanding, will inform pt, and voiced no further questions or concerns at this time. ______

## 2014-09-01 NOTE — Progress Notes (Signed)
Quick Note:  Reminder sent to PW's box for CT Chest in 2 years ______

## 2014-11-04 ENCOUNTER — Other Ambulatory Visit: Payer: Self-pay | Admitting: *Deleted

## 2014-11-04 DIAGNOSIS — IMO0002 Reserved for concepts with insufficient information to code with codable children: Secondary | ICD-10-CM

## 2014-11-05 MED ORDER — TRAMADOL HCL 50 MG PO TABS: 50.0000 mg | ORAL_TABLET | Freq: Three times a day (TID) | ORAL | Status: DC | PRN

## 2014-11-09 NOTE — Telephone Encounter (Signed)
Patient informed, has follow up scheduled with PCP this week, will pick up then.

## 2014-11-12 ENCOUNTER — Ambulatory Visit (INDEPENDENT_AMBULATORY_CARE_PROVIDER_SITE_OTHER): Payer: Medicare Other | Admitting: Family Medicine

## 2014-11-12 ENCOUNTER — Encounter: Payer: Self-pay | Admitting: Family Medicine

## 2014-11-12 VITALS — BP 135/60 | HR 52 | Temp 97.7°F | Ht 66.0 in | Wt 195.0 lb

## 2014-11-12 DIAGNOSIS — E782 Mixed hyperlipidemia: Secondary | ICD-10-CM | POA: Diagnosis not present

## 2014-11-12 DIAGNOSIS — G9050 Complex regional pain syndrome I, unspecified: Secondary | ICD-10-CM

## 2014-11-12 DIAGNOSIS — R251 Tremor, unspecified: Secondary | ICD-10-CM

## 2014-11-12 DIAGNOSIS — G250 Essential tremor: Secondary | ICD-10-CM

## 2014-11-12 DIAGNOSIS — I10 Essential (primary) hypertension: Secondary | ICD-10-CM | POA: Diagnosis not present

## 2014-11-12 DIAGNOSIS — IMO0002 Reserved for concepts with insufficient information to code with codable children: Secondary | ICD-10-CM

## 2014-11-12 DIAGNOSIS — G252 Other specified forms of tremor: Secondary | ICD-10-CM

## 2014-11-12 MED ORDER — PROPRANOLOL HCL 80 MG PO TABS: 80.0000 mg | ORAL_TABLET | Freq: Two times a day (BID) | ORAL | Status: DC

## 2014-11-12 MED ORDER — PRIMIDONE 250 MG PO TABS: 250.0000 mg | ORAL_TABLET | Freq: Two times a day (BID) | ORAL | Status: DC

## 2014-11-12 MED ORDER — OMEPRAZOLE 20 MG PO CPDR: 20.0000 mg | DELAYED_RELEASE_CAPSULE | Freq: Every day | ORAL | Status: DC | PRN

## 2014-11-12 MED ORDER — GABAPENTIN 300 MG PO CAPS: 300.0000 mg | ORAL_CAPSULE | Freq: Three times a day (TID) | ORAL | Status: DC

## 2014-11-12 MED ORDER — LISINOPRIL 10 MG PO TABS: 10.0000 mg | ORAL_TABLET | Freq: Every day | ORAL | Status: DC

## 2014-11-12 NOTE — Assessment & Plan Note (Signed)
Lipid panel today.  Continue Zocor. ?

## 2014-11-12 NOTE — Progress Notes (Signed)
   Subjective:   Brendan [name] is a 74 y.o. male with a history of HTN, complex regional pain syndrome and tremor here for f/u of chronic issues  Complex regional pain syndrome: . - Taking Tramadol 50 mg twice a day when necessary and gabapentin 300 mg twice a day. - Wife thinks gabapentin needs to be increased to TID - having more burning in feet and ankles - Gabapentin works when he takes it, but it wears off in between doses - gets a little sleepy after taking gabapentin and naps throughout the day, but doesn't affect function  HTN:  - Taking lisinopril 10 mg daily and propranolol 80 mg twice a day.  - Denies any medication side effects.  - Denies any chest pain, shortness breath, headaches.  - Is not checking his blood pressure at home.  Tremor:  - Reports that tremor is stable. -  Was put on primidone and propranolol by neurology and told that it would not get any better than it currently is. Does not want to go back to neurology. - having trouble eating, but doesn't want wife to help him - has been to OT before and has special accommodating silverware - trying to eat with R hand  Review of Systems:  Per HPI. All other systems reviewed and are negative.   PMH, PSH, Medications, Allergies, and FmHx reviewed and updated in EMR.  Social History: non smoker  Objective:  BP 147/59 mmHg  Pulse 52  Temp(Src) 97.7 F (36.5 C) (Oral)  Ht  (1.676 m)  Wt 195 lb (88.451 kg)  BMI 31.49 kg/m2  Gen:  74 y.o. male in NAD HEENT: NCAT, MMM, EOMI, PERRL, anicteric sclerae CV: RRR, no MRG, intact distal pulses Resp: Non-labored, CTAB, no wheezes noted Ext: WWP, no edema MSK: Resting fine tremor noted in b/l arms, worse with intention. Sensation and strength intact Neuro: Alert and oriented, speech normal      Chemistry      Component Value Date/Time   NA 139 01/12/2014 0948   NA 142 11/27/2012 1133   K 4.7 01/12/2014 0948   CL 102 01/12/2014 0948   CO2 28 01/12/2014  0948   BUN 15 01/12/2014 0948   BUN 13 11/27/2012 1133   CREATININE 1.05 01/12/2014 0948   CREATININE 1.13 11/27/2012 1133      Component Value Date/Time   CALCIUM 9.4 01/12/2014 0948   ALKPHOS 106 01/12/2014 0948   AST 13 01/12/2014 0948   ALT 13 01/12/2014 0948   BILITOT 0.6 01/12/2014 0948      Lab Results  Component Value Date   WBC 5.8 01/12/2014   HGB 14.3 01/12/2014   HCT 41.5 01/12/2014   MCV 98.3 01/12/2014   PLT 152 01/12/2014   Lab Results  Component Value Date   TSH 11.962* 01/12/2014   Lab Results  Component Value Date   HGBA1C 5.4 06/16/2014   Assessment:     Brendan [name] is a 74 y.o. male here for HTN, complex regional pain syndrome and tremor f/u.    Plan:     See problem list for problem-specific plans.   Erasmo Downer, MD PGY-2,  [facility] 11/12/2014  2:28 PM

## 2014-11-12 NOTE — Patient Instructions (Signed)
Nice to see you again today. Continue to take your current medications. We'll increase your gabapentin dose from 300 mg twice daily to 300 mg 3 times daily. Give me a call in 3-4 weeks if you continue having worse pain in the mornings and we can consider increasing your nighttime dose of gabapentin 600 mg. We are getting some labs today and someone will call you or send you a letter with the results when they're available. Come back to see me in 6 months for your next follow-up for these chronic issues.  Take care, Dr. Leonard Schwartz

## 2014-11-12 NOTE — Assessment & Plan Note (Signed)
Continue tramadol 50 mg 3 times a day when necessary Increase gabapentin to 300 mg 3 times a day If continues to have worse pain in the morning, consider increasing nighttime gabapentin dose to 600 mg Follow-up in 6 months

## 2014-11-12 NOTE — Assessment & Plan Note (Signed)
Continue propranolol and primidone 

## 2014-11-12 NOTE — Assessment & Plan Note (Signed)
Well controlled on manual recheck at 135/60 Continue lisinopril 10 mg daily Bmet today Follow-up in 6 months

## 2014-11-13 ENCOUNTER — Encounter: Payer: Self-pay | Admitting: Family Medicine

## 2014-11-13 LAB — LIPID PANEL
Cholesterol: 141 mg/dL (ref 125–200)
HDL: 34 mg/dL — ABNORMAL LOW (ref >=40)
LDL Cholesterol: 89 mg/dL (ref <130)
Total CHOL/HDL Ratio: 4.1 Ratio (ref <=5.0)
Triglycerides: 92 mg/dL (ref <150)
VLDL: 18 mg/dL (ref <30)

## 2014-11-13 LAB — BASIC METABOLIC PANEL
BUN: 14 mg/dL (ref 7–25)
CO2: 27 mEq/L (ref 20–31)
Calcium: 9.2 mg/dL (ref 8.6–10.3)
Chloride: 104 mEq/L (ref 98–110)
Creat: 1.07 mg/dL (ref 0.70–1.18)
Glucose, Bld: 96 mg/dL (ref 65–99)
Potassium: 4.9 mEq/L (ref 3.5–5.3)
Sodium: 141 mEq/L (ref 135–146)

## 2014-12-10 ENCOUNTER — Telehealth: Payer: Self-pay | Admitting: Family Medicine

## 2014-12-10 NOTE — Telephone Encounter (Signed)
Daughter Would like to talk to Dr b about new drug, [facility]. Instead of the neurton Please advise

## 2014-12-14 NOTE — Telephone Encounter (Signed)
Called and left message for patient.  Called daughter back and discussed possibility of an extended release form of gabapentin.  Patient does have breakthrough pain around time for next dose of gabapentin.  We have been working on up-titrating as side effects allow.  Will speak with pharmacy and call daughter back to discuss further.  Erasmo Downer, MD, MPH PGY-2,  [facility] 12/14/2014 11:01 AM

## 2014-12-16 MED ORDER — GABAPENTIN (ONCE-DAILY) 300 MG PO TABS: 900.0000 mg | ORAL_TABLET | Freq: Every day | ORAL | Status: DC

## 2014-12-16 NOTE — Telephone Encounter (Signed)
Called daughter and left message.  After discussing with pharmacy, will  Try Gralise (extended release Gabapentin)  daily at bedtime and stop gabapentin.  Unsure if insurance will cover this medication.  Would like to advise daughter to not pick it up from pharmacy if it is expensive and call the clinic to let me know.  This medication can be sedating, so it is best taken at bedtime.  The patient should not take the short-acting form of gabapentin while taking this medication.  Please have a nurse give this message to the daughter if she returns my call.  If she has any questions, I will be happy to call her back.  Erasmo Downer, MD, MPH PGY-2,  [facility] 12/16/2014 2:05 PM

## 2014-12-22 NOTE — Telephone Encounter (Signed)
Left message for patient daughter to return call.  

## 2014-12-28 ENCOUNTER — Telehealth: Payer: Self-pay | Admitting: Family Medicine

## 2014-12-28 NOTE — Telephone Encounter (Signed)
Received info regarding medication.[name] Apologize for delay in response. Have been out of town.  Will follow up with pharmacy and get back to office

## 2014-12-31 ENCOUNTER — Other Ambulatory Visit: Payer: Self-pay | Admitting: Critical Care Medicine

## 2014-12-31 DIAGNOSIS — J920 Pleural plaque with presence of asbestos: Secondary | ICD-10-CM

## 2015-01-06 ENCOUNTER — Ambulatory Visit (INDEPENDENT_AMBULATORY_CARE_PROVIDER_SITE_OTHER): Payer: Medicare Other | Admitting: *Deleted

## 2015-01-06 DIAGNOSIS — Z23 Encounter for immunization: Secondary | ICD-10-CM

## 2015-01-28 ENCOUNTER — Other Ambulatory Visit: Payer: Self-pay | Admitting: Cardiology

## 2015-01-28 NOTE — Telephone Encounter (Signed)
Rx(s) sent to pharmacy electronically. OV 02/11/15 with Dr. Herbie BaltimoreHarding

## 2015-02-11 ENCOUNTER — Ambulatory Visit (INDEPENDENT_AMBULATORY_CARE_PROVIDER_SITE_OTHER): Payer: Medicare Other | Admitting: Cardiology

## 2015-02-11 VITALS — BP 132/72 | HR 53 | Ht 66.0 in | Wt 199.2 lb

## 2015-02-11 DIAGNOSIS — E785 Hyperlipidemia, unspecified: Secondary | ICD-10-CM

## 2015-02-11 DIAGNOSIS — I10 Essential (primary) hypertension: Secondary | ICD-10-CM

## 2015-02-11 DIAGNOSIS — I25708 Atherosclerosis of coronary artery bypass graft(s), unspecified, with other forms of angina pectoris: Secondary | ICD-10-CM

## 2015-02-11 DIAGNOSIS — E669 Obesity, unspecified: Secondary | ICD-10-CM

## 2015-02-11 DIAGNOSIS — Z951 Presence of aortocoronary bypass graft: Secondary | ICD-10-CM

## 2015-02-11 DIAGNOSIS — I25119 Atherosclerotic heart disease of native coronary artery with unspecified angina pectoris: Secondary | ICD-10-CM

## 2015-02-11 DIAGNOSIS — I208 Other forms of angina pectoris: Secondary | ICD-10-CM

## 2015-02-11 NOTE — Patient Instructions (Signed)
NO CHANGES WITH CURRENT MEDICATIONS   Your physician wants you to follow-up in 6 MONTHS WITH DR HARDING. You will receive a reminder letter in the mail two months in advance. If you don't receive a letter, please call our office to schedule the follow-up appointment.

## 2015-02-11 NOTE — Progress Notes (Signed)
PCP: Shirlee LatchAngela Bacigalupo, MD  Clinic Note: Chief Complaint  Patient presents with  . Follow-up    6 mo//pt c/o SOB on exertion and headaches occasionally/no other Sx.    HPI: Brendan BoJerry W Stingley is a 74 y.o. male with a PMH below who presents today for 6 month CAD f/u.   He has a h/o CAD with CABGx3 in 1997.  Unstable Angina in June 2013 - Occluded SVG-Diag with Patent LIMA-LAD & SVG-OM treated medically.  He does have chronic stable angina. He is on Inderal as BB to help Rx tremors along with CAD.   Is coming due to for surveillance Myoview by June 2017.  Brendan [name] was last seen in April 2016.  Stable angina Sx.    Recent Hospitalizations: none  Studies Reviewed: none  Interval History: overall, he really has no major change in his  His cardiac symptoms. He is mostly just troubled by neuropathy pain in his legs and feet than in his ability to walk. He does still get short of breath with walking, because it is a lot of work to do so. He has very poor balance and his young use a walker now. He denies any rapid irregular heartbeat/palpitations or arrhythmias. Mild lower extremity edema, no PND or orthopnea. He really can't verbalize to me that the nitroglycerin for anginal type chest pain with either rest or exertion.  No syncope/near syncope, or TIA/amaurosis fugax symptoms.  He doesn't do enough walking too even though he would have claudication.  ROS: A comprehensive was performed. Review of Systems  Constitutional: Positive for malaise/fatigue.  HENT: Negative for nosebleeds.   Eyes:       Worsening vision  Respiratory: Positive for shortness of breath (@ baseline).   Cardiovascular:       Per HPI  Musculoskeletal: Positive for back pain, joint pain and falls.  Neurological: Positive for dizziness (positional) and tremors (chronic). Negative for seizures.  Endo/Heme/Allergies: Bruises/bleeds easily.  Psychiatric/Behavioral: Negative for depression. The patient has  insomnia. The patient is not nervous/anxious.   All other systems reviewed and are negative.   Past Medical History  Diagnosis Date  . CAD in native artery 1997    Referred for CABG x3 in 2004 for LAD diagonal bifurcation lesion  . S/P CABG x 3 2004     LIMA-LAD, SVG to diagonal, SVG to OM  . Unstable angina pectoris [facility](HCC)  June 2013    Cardiac cath: occluded SVG-DI. Patent LIMA-LAD and SVG-OM. EF 45% with apical inferior HK.  . Coronary atherosclerosis of artery bypass graft June 2013    Occluded SVG-D1; Cardiologist Dr. Herbie BaltimoreHarding  . Hypertension   . Hyperlipidemia LDL goal <70   . Gastroesophageal reflux disease   . Peripheral neuropathy (HCC)     possible peripheral neuropathy  . Memory loss     Mild  . Gait disorder   . Benign enlargement of prostate   . Anxiety disorder   . Obesity   . Tremor, essential     On Primidone  . Cervical spinal stenosis   . Glaucoma   . Hiatal hernia     GI: Dr Evette CristalGanem  . Complex regional pain syndrome of right upper extremity     Right wrist; L arm  . Reflex sympathetic dystrophy     Past Surgical History  Procedure Laterality Date  . Coronary artery bypass graft  06/2002    LIMA-LAD, SVG-D1, SVG-OM  . Nm myocar perf wall motion  03/23/2009  protocol:Bruce, normal perfusion in all regions, post-stress EF 72%, exercise capacity , EKG negative for ischemia.  . Shoulder orthoscopic surgery   06/2003  . Knee surgery Left   . Wrist ganglion cyst Left   . Transthoracic echocardiogram  April 2014    EF 55-60%; no regional wall motion abnormalities. Relatively normal study normal diastolic parameters/abnormal relaxation  . Left heart catheterization with coronary/graft angiogram N/A 10/13/2011    Procedure: LEFT HEART CATHETERIZATION WITH Isabel Caprice;  Surgeon: Marykay Lex, MD;  Location: [facility] CATH LAB;  Service: Cardiovascular;  Laterality: N/A;   Prior to Admission medications   Medication Sig Start Date End Date Taking?  Authorizing Provider  aspirin (LONGS ADULT LOW STRENGTH ASA) 81 MG EC tablet Take 81 mg by mouth daily.     Yes Historical Provider, MD  fish oil-omega-3 fatty acids 1000 MG capsule Take 1 g by mouth 2 (two) times daily.   Yes Historical Provider, MD  furosemide (LASIX) 20 MG tablet Take 20 mg by mouth as needed. 01/28/13  Yes Marykay Lex, MD  gabapentin (NEURONTIN) 300 MG capsule Take 1 capsule (300 mg total) by mouth 3 (three) times daily. 11/12/14  Yes Erasmo Downer, MD  Gabapentin, Once-Daily, (GRALISE) 300 MG TABS Take 900 mg by mouth at bedtime. 12/16/14  Yes Erasmo Downer, MD  lisinopril (PRINIVIL,ZESTRIL) 10 MG tablet Take 1 tablet (10 mg total) by mouth daily. 11/12/14  Yes Erasmo Downer, MD  nitroGLYCERIN (NITROSTAT) 0.4 MG SL tablet Place 1 tablet (0.4 mg total) under the tongue every 5 (five) minutes as needed for chest pain. call 911 if chest pain not better 08/08/11  Yes Charm Rings, MD  omeprazole (PRILOSEC) 20 MG capsule Take 1 capsule (20 mg total) by mouth daily as needed (acid reflux). 11/12/14  Yes Erasmo Downer, MD  primidone (MYSOLINE) 250 MG tablet Take 1 tablet (250 mg total) by mouth 2 (two) times daily. 11/12/14  Yes Erasmo Downer, MD  propranolol (INDERAL) 80 MG tablet Take 1 tablet (80 mg total) by mouth 2 (two) times daily. 11/12/14  Yes Erasmo Downer, MD  simvastatin (ZOCOR) 80 MG tablet Take 1 tablet (80 mg total) by mouth at bedtime. 06/16/14  Yes Erasmo Downer, MD  traMADol (ULTRAM) 50 MG tablet Take 1 tablet (50 mg total) by mouth 3 (three) times daily as needed. 11/05/14  Yes Erasmo Downer, MD     No Known Allergies  Social History   Social History  . Marital Status: Married    Spouse Name: N/A  . Number of Children: N/A  . Years of Education: N/A   Social History Main Topics  . Smoking status: Former Smoker -- 3.00 packs/day for 20 years    Types: Cigarettes    Quit date: 04/17/1968  . Smokeless tobacco: Never  Used  . Alcohol Use: Yes     Comment: Occasional 2 mixed drinks or 3 beers.  . Drug Use: No  . Sexual Activity: Not Asked   Other Topics Concern  . None   Social History Narrative   Married.  Wife is Royal Piedra.   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   Former smoker, quit 20 years ago. Does not drink alcohol.   Does not get routine exercise, walks sometimes with a cane.   Family History  Problem Relation Age of Onset  . Heart disease Mother   . Heart disease Brother   . Tremor Paternal Uncle   .  Heart disease Brother     Wt Readings from Last 3 Encounters:  02/11/15 199 lb 3.2 oz (90.357 kg)  11/12/14 195 lb (88.451 kg)  08/26/14 197 lb (89.359 kg)    PHYSICAL EXAM BP 132/72 mmHg  Pulse 53  Ht 5\' 6"  (1.676 m)  Wt 199 lb 3.2 oz (90.357 kg)  BMI 32.17 kg/m2 General appearance: alert, cooperative, appears stated age, NAD; and mildly obese; well-nourished, well-groomed. Answers questions appropriately. Significant baseline tremor of left hand tless prominent right hand that shakes his whole shoulder and head  HEENT: [place]/AT, EOMI, MMM, anicteric sclera  Neck: no adenopathy, no carotid bruit and no JVD;  Lungs: CTA B., normal percussion bilaterally and non-labored  Heart: RRR, S1, S2 normal, no murmur, click, rub or gallop; nondisplaced PMI  Abdomen: soft, non-tender; bowel sounds normal; no masses, no organomegaly;  Extremities: No C/C/ with trace E.Pulses: 2+ and symmetric;  Skin: Mild venous stasis changes found.  Neurologic: Alert, oriented, thought content appropriate; Cranial nerves: normal (II-XII grossly intact)    Adult ECG Report  Rate: 53 ;  Rhythm: sinus bradycardia and LAFB ( -59), poor R wave progression in precordial leads. Cannot exclude anterior MI, age indeterminate.;   Narrative Interpretation: stable EKG. No change from prior EKG.   Other studies Reviewed: Additional studies/ records that were reviewed today include:  Recent  Labs:  Lab Results  Component Value Date   CHOL 141 11/12/2014   HDL 34* 11/12/2014   LDLCALC 89 11/12/2014   LDLDIRECT 84 08/30/2006   TRIG 92 11/12/2014   CHOLHDL 4.1 11/12/2014   Lab Results  Component Value Date   CREATININE 1.07 11/12/2014   Lab Results  Component Value Date   K 4.9 11/12/2014   Lab Results  Component Value Date   HGBA1C 5.4 06/16/2014     ASSESSMENT / PLAN: Problem List Items Addressed This Visit    S/P CABG x 3 (Chronic)   Relevant Orders   EKG 12-Lead   Obesity (BMI 30-39.9) (Chronic)     Very limited as far as mobility goes because of his neuropathy.A. Considering water aerobics/walking.      Relevant Orders   EKG 12-Lead   HYPERTENSION, BENIGN - Primary (Chronic)   Relevant Orders   EKG 12-Lead   Hyperlipidemia LDL goal <70 (Chronic)    Not quite at goal in August. He is on 80 mg of simvastatin plus omega-3 fatty acids. Would consider the possibility of converting from simvastatin to atorvastatin or Crestor.      Relevant Orders   EKG 12-Lead   CHRONIC STABLE ANGINA (Chronic)    Relatively stable without any additional use of nitroglycerin. If he were to need more restless, would consider long-acting nitrate.      Relevant Orders   EKG 12-Lead   Atherosclerotic heart disease of native coronary artery with angina pectoris (HCC) (Chronic)    Severe native CAD. Very difficult to tell any true anginal level because of his lack of mobility from tremor, neuropathy and other muscle skeletal issues. On aspirin without Plavix. On high-dose simvastatin along with ACE inhibitor.  He is on Inderal for his tremor, and chose to remain on Inderal versus a different beta blocker because of his tremor.      Relevant Orders   EKG 12-Lead   Atherosclerotic heart disease of artery bypass graft (Chronic)    As one vein graft down. Other grafts remained patent. Mild may be not a class I exertional angina. And when necessary  nitroglycerin. On  aspirin. Plan would be to consider Myoview in 2017.       Relevant Orders   EKG 12-Lead      Current medicines are reviewed at length with the patient today. (+/- concerns) none The following changes have been made: none Studies Ordered:   Orders Placed This Encounter  Procedures  . EKG 12-Lead    ROV 6 monhts   Zac Torti, Piedad Climes, M.D., M.S. Interventional Cardiologist   Pager # [phone]

## 2015-02-13 ENCOUNTER — Encounter: Payer: Self-pay | Admitting: Cardiology

## 2015-02-13 NOTE — Assessment & Plan Note (Signed)
Very limited as far as mobility goes because of his neuropathy.A. Considering water aerobics/walking.

## 2015-02-13 NOTE — Assessment & Plan Note (Signed)
As one vein graft down. Other grafts remained patent. Mild may be not a class I exertional angina. And when necessary nitroglycerin. On aspirin. Plan would be to consider Myoview in 2017.

## 2015-02-13 NOTE — Assessment & Plan Note (Signed)
Severe native CAD. Very difficult to tell any true anginal level because of his lack of mobility from tremor, neuropathy and other muscle skeletal issues. On aspirin without Plavix. On high-dose simvastatin along with ACE inhibitor.  He is on Inderal for his tremor, and chose to remain on Inderal versus a different beta blocker because of his tremor.

## 2015-02-13 NOTE — Assessment & Plan Note (Signed)
Relatively stable without any additional use of nitroglycerin. If he were to need more restless, would consider long-acting nitrate.

## 2015-02-13 NOTE — Assessment & Plan Note (Signed)
Not quite at goal in August. He is on 80 mg of simvastatin plus omega-3 fatty acids. Would consider the possibility of converting from simvastatin to atorvastatin or Crestor.

## 2015-02-22 ENCOUNTER — Other Ambulatory Visit: Payer: Self-pay | Admitting: *Deleted

## 2015-02-22 DIAGNOSIS — IMO0002 Reserved for concepts with insufficient information to code with codable children: Secondary | ICD-10-CM

## 2015-02-23 MED ORDER — TRAMADOL HCL 50 MG PO TABS: 50.0000 mg | ORAL_TABLET | Freq: Three times a day (TID) | ORAL | Status: DC | PRN

## 2015-06-08 ENCOUNTER — Ambulatory Visit (INDEPENDENT_AMBULATORY_CARE_PROVIDER_SITE_OTHER): Payer: PPO | Admitting: Family Medicine

## 2015-06-08 ENCOUNTER — Encounter: Payer: Self-pay | Admitting: Family Medicine

## 2015-06-08 VITALS — BP 150/70 | HR 60 | Temp 98.0°F | Ht 66.0 in | Wt 200.7 lb

## 2015-06-08 DIAGNOSIS — I10 Essential (primary) hypertension: Secondary | ICD-10-CM | POA: Diagnosis not present

## 2015-06-08 DIAGNOSIS — IMO0002 Reserved for concepts with insufficient information to code with codable children: Secondary | ICD-10-CM

## 2015-06-08 DIAGNOSIS — G9050 Complex regional pain syndrome I, unspecified: Secondary | ICD-10-CM | POA: Diagnosis not present

## 2015-06-08 DIAGNOSIS — R251 Tremor, unspecified: Secondary | ICD-10-CM | POA: Diagnosis not present

## 2015-06-08 MED ORDER — OMEPRAZOLE 20 MG PO CPDR: 20.0000 mg | DELAYED_RELEASE_CAPSULE | Freq: Every day | ORAL | Status: DC | PRN

## 2015-06-08 MED ORDER — LISINOPRIL 10 MG PO TABS: 10.0000 mg | ORAL_TABLET | Freq: Every day | ORAL | Status: DC

## 2015-06-08 MED ORDER — PRIMIDONE 250 MG PO TABS: 250.0000 mg | ORAL_TABLET | Freq: Two times a day (BID) | ORAL | Status: DC

## 2015-06-08 MED ORDER — GABAPENTIN 300 MG PO CAPS: ORAL_CAPSULE | ORAL | Status: DC

## 2015-06-08 MED ORDER — PROPRANOLOL HCL 80 MG PO TABS: 80.0000 mg | ORAL_TABLET | Freq: Two times a day (BID) | ORAL | Status: DC

## 2015-06-08 MED ORDER — SIMVASTATIN 80 MG PO TABS: 80.0000 mg | ORAL_TABLET | Freq: Every day | ORAL | Status: DC

## 2015-06-08 NOTE — Patient Instructions (Signed)
Nice to see you again today. Continue taking your current medications. We will stop that tramadol. He can increase the gabapentin to 300 mg in the morning and midday and 600 mg at bedtime. After 2 weeks if this is not helping you can increase to 600 mg 3 times daily. I like see back in 6 weeks to see how the gabapentin is doing. We can also recheck on your blood pressure at that time.  Take care, Dr. Leonard Schwartz

## 2015-06-08 NOTE — Assessment & Plan Note (Signed)
Poorly controlled but better on manual recheck at 150/70 Patient has not taken his medication today and does have a lot of stress Continue lisinopril 10 mg daily Follow-up in 6 weeks

## 2015-06-08 NOTE — Progress Notes (Signed)
Subjective:   KARLOS SCADDEN is a 75 y.o. male with a history of HTN, CAD, GERD, complex regional pain syndrome, HLD, obesity, essential tremor here for follow-up of tremor, pain, HTN.  Tremor:  - Reports that tremor is intermittently worse. - Was put on primidone and propranolol by neurology and told that it would not get any better than it currently is. Does not want to go back to neurology. - having trouble eating, but doesn't want wife to help him - has been to OT before and has special accommodating silverware - trying to eat with R hand  Complex regional pain syndrome: . - Taking Tramadol 50 mg twice a day when necessary (hasnt taken in >1 month) - wants to stop it - gabapentin 300 mg in AM and  qhs  - Wife thinks gabapentin needs to be increased - burning in feet and ankles is about the same - gets a little sleepy after taking gabapentin and naps throughout the day, but doesn't affect function - doesn't sleep much at night, not due to pain  HTN: - Medications: lisinopril  daily - Compliance: good, but didn't take any medications today - Checking BP at home: no - Denies any SOB, CP (except intermittent chronic stable angina), vision changes, LE edema, medication SEs, or symptoms of hypotension - a lot of family stressors currently - Diet: doesn't eat much - "eats like a bird" - wont eat vegetables or much meat, eats a lot of junk food - Exercise: none   Review of Systems:  Per HPI.   Social History: former smoker - quit in 1970  Objective:  BP 150/70 mmHg  Pulse 60  Temp(Src) 98 F (36.7 C) (Oral)  Ht  (1.676 m)  Wt 200 lb 11.2 oz (91.037 kg)  BMI 32.41 kg/m2  Gen:  75 y.o. male in NAD HEENT: NCAT, MMM, EOMI, PERRL, anicteric sclerae, OP clear CV: RRR, no MRG Resp: Non-labored, CTAB, no wheezes noted Ext: WWP, no edema MSK: Resting fine tremor noted in bilateral arms, worse with intention. No obvious deformities. Sensation and strength  intact Neuro: Alert and oriented, speech normal      Chemistry      Component Value Date/Time   NA 141 11/12/2014 1510   NA 142 11/27/2012 1133   K 4.9 11/12/2014 1510   CL 104 11/12/2014 1510   CO2 27 11/12/2014 1510   BUN 14 11/12/2014 1510   BUN 13 11/27/2012 1133   CREATININE 1.07 11/12/2014 1510   CREATININE 1.13 11/27/2012 1133      Component Value Date/Time   CALCIUM 9.2 11/12/2014 1510   ALKPHOS 106 01/12/2014 0948   AST 13 01/12/2014 0948   ALT 13 01/12/2014 0948   BILITOT 0.6 01/12/2014 0948      Lab Results  Component Value Date   WBC 5.8 01/12/2014   HGB 14.3 01/12/2014   HCT 41.5 01/12/2014   MCV 98.3 01/12/2014   PLT 152 01/12/2014   Lab Results  Component Value Date   TSH 11.962* 01/12/2014   Lab Results  Component Value Date   HGBA1C 5.4 06/16/2014   Assessment & Plan:     TYRE BEAVER is a 76 y.o. male here ffollow-up of the following  HYPERTENSION, BENIGN Poorly controlled but better on manual recheck at 150/70 Patient has not taken his medication today and does have a lot of stress Continue lisinopril 10 mg daily Follow-up in 6 weeks  Complex regional pain syndrome Discontinue tramadol as  this was not helping the patient Increase gabapentin to 300 mg in the morning, 300 mg midday, 600 mg daily at bedtime After 2 weeks if this is not helping, instructed patient to increase to 600 mg 3 times a day Follow-up in 6 weeks and consider increasing gabapentin further  Tremor Continue propranolol and primidone     Erasmo Downer, MD MPH PGY-2,  [facility] 06/08/2015  12:20 PM

## 2015-06-08 NOTE — Assessment & Plan Note (Signed)
Discontinue tramadol as this was not helping the patient Increase gabapentin to 300 mg in the morning, 300 mg midday, 600 mg daily at bedtime After 2 weeks if this is not helping, instructed patient to increase to 600 mg 3 times a day Follow-up in 6 weeks and consider increasing gabapentin further

## 2015-06-08 NOTE — Assessment & Plan Note (Signed)
Continue propranolol and primidone

## 2015-06-28 ENCOUNTER — Encounter: Payer: Self-pay | Admitting: Emergency Medicine

## 2015-06-28 ENCOUNTER — Ambulatory Visit (INDEPENDENT_AMBULATORY_CARE_PROVIDER_SITE_OTHER): Payer: PPO | Admitting: Emergency Medicine

## 2015-06-28 VITALS — BP 104/72 | HR 84 | Ht 66.0 in | Wt 200.0 lb

## 2015-06-28 DIAGNOSIS — J920 Pleural plaque with presence of asbestos: Secondary | ICD-10-CM | POA: Diagnosis not present

## 2015-06-28 NOTE — Progress Notes (Signed)
Subjective:    Patient ID: Brendan [name], male    DOB: 05/31/1940, 75 y.o.   MRN: [id number]  HPI  75 year old man, former heavy smoker, history of coronary disease and CABG, history of  Asbestos exposure with associated pleural plaquing. He's been followed in our office by Dr. Delford Field. His last CT scan of the chest performed on 08/31/14 that I personally reviewed. This shows stable bilateral pleural plaques dating back to 04/15/12. I do not see any evidence of interval change or evidence to support mesothelioma. He has been doing well, he does have some occasional cough, some throat clearing.  Has nasal congestion that comes and goes.  He is fairly sedentary, he has chronic fatigue. Occasionally he is stopped by his breathing.  He is to see Cardiology in April.                                                                                                                                                                                                  Review of Systems As per history of present illness Past Medical History  Diagnosis Date  . CAD in native artery 1997    Referred for CABG x3 in 2004 for LAD diagonal bifurcation lesion  . S/P CABG x 3 2004     LIMA-LAD, SVG to diagonal, SVG to OM  . Unstable angina pectoris [facility])  June 2013    Cardiac cath: occluded SVG-DI. Patent LIMA-LAD and SVG-OM. EF 45% with apical inferior HK.  . Coronary atherosclerosis of artery bypass graft June 2013    Occluded SVG-D1; Cardiologist Dr. Herbie Baltimore  . Hypertension   . Hyperlipidemia LDL goal <70   . Gastroesophageal reflux disease   . Peripheral neuropathy (HCC)     possible peripheral neuropathy  . Memory loss     Mild  . Gait disorder   . Benign enlargement of prostate   . Anxiety disorder   . Obesity   . Tremor, essential     On Primidone  . Cervical spinal stenosis   . Glaucoma   . Hiatal hernia     GI: Dr Evette Cristal  . Complex regional pain syndrome of right upper extremity     Right  wrist; L arm  . Reflex sympathetic dystrophy      Family History  Problem Relation Age of Onset  . Heart disease Mother   . Heart disease Brother   . Tremor Paternal Uncle   . Heart disease Brother      Social History   Social History  . Marital Status: Married    Spouse Name:  N/A  . Number of Children: N/A  . Years of Education: N/A   Occupational History  . Not on file.   Social History Main Topics  . Smoking status: Former Smoker -- 3.00 packs/day for 20 years    Types: Cigarettes    Quit date: 04/17/1968  . Smokeless tobacco: Never Used  . Alcohol Use: Yes     Comment: Occasional 2 mixed drinks or 3 beers.  . Drug Use: No  . Sexual Activity: Not on file   Other Topics Concern  . Not on file   Social History Narrative   Married.  Wife is Royal Piedra.   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   Former smoker, quit 20 years ago. Does not drink alcohol.   Does not get routine exercise, walks sometimes with a cane.     No Known Allergies   Outpatient Prescriptions Prior to Visit  Medication Sig Dispense Refill  . aspirin (LONGS ADULT LOW STRENGTH ASA) 81 MG EC tablet Take 81 mg by mouth daily.      . fish oil-omega-3 fatty acids 1000 MG capsule Take 1 g by mouth 2 (two) times daily.    [name] gabapentin (NEURONTIN) 300 MG capsule Take  qAM and mid day and take  qhs. Can increase to  TID. 120 capsule 5  . lisinopril (PRINIVIL,ZESTRIL) 10 MG tablet Take 1 tablet (10 mg total) by mouth daily. 30 tablet 5  . nitroGLYCERIN (NITROSTAT) 0.4 MG SL tablet Place 1 tablet (0.4 mg total) under the tongue every 5 (five) minutes as needed for chest pain. call 911 if chest pain not better 30 tablet 3  . omeprazole (PRILOSEC) 20 MG capsule Take 1 capsule (20 mg total) by mouth daily as needed (acid reflux). 90 capsule 3  . primidone (MYSOLINE) 250 MG tablet Take 1 tablet (250 mg total) by mouth 2 (two) times daily. 60 tablet 5  . propranolol (INDERAL) 80 MG  tablet Take 1 tablet (80 mg total) by mouth 2 (two) times daily. 60 tablet 5  . simvastatin (ZOCOR) 80 MG tablet Take 1 tablet (80 mg total) by mouth at bedtime. 30 tablet 11  . furosemide (LASIX) 20 MG tablet Take 20 mg by mouth as needed.     Facility-Administered Medications Prior to Visit  Medication Dose Route Frequency Provider Last Rate Last Dose  . 0.9 %  sodium chloride infusion   Intravenous Continuous Marykay Lex, MD      . pneumococcal 23 valent vaccine (PNU-IMMUNE) injection 0.5 mL  0.5 mL Intramuscular Once Charm Rings, MD      . sodium chloride 0.9 % injection 3 mL  3 mL Intravenous PRN Marykay Lex, MD             Objective:   Physical Exam Filed Vitals:   06/28/15 1434  BP: 104/72  Pulse: 84  Height:  (1.676 m)  Weight: 200 lb (90.719 kg)  SpO2: 95%   Gen: Pleasant, well-nourished, in no distress,  normal affect  ENT: No lesions,  mouth clear,  oropharynx clear, no postnasal drip  Neck: No JVD, no TMG, no carotid bruits  Lungs: No use of accessory muscles, no dullness to percussion, clear without rales or rhonchi  Cardiovascular: RRR, heart sounds normal, no murmur or gallops, no peripheral edema  Musculoskeletal: No deformities, no cyanosis or clubbing  Neuro: alert, non focal  Skin: Warm, no lesions or rashes     Assessment & Plan:  Pleural plaque  with presence of asbestos History of asbestos exposure with pleural plaques. I have reviewed his CT scan from May 2016. This is stable compared to priors dating back to 2013. He does not have any evidence of mesothelioma. We will plan to repeat his CT scan in May 2018 or sooner if he has any new findings..[name]

## 2015-06-28 NOTE — Assessment & Plan Note (Signed)
History of asbestos exposure with pleural plaques. I have reviewed his CT scan from May 2016. This is stable compared to priors dating back to 2013. He does not have any evidence of mesothelioma. We will plan to repeat his CT scan in May 2018 or sooner if he has any new findings..Brendan [name]

## 2015-06-28 NOTE — Patient Instructions (Addendum)
Please follow with Dr Herbie BaltimoreHarding as planned.  Continue your current medications.  We will check your CT scan chest in May 2018, or sooner if you have any new problems.  Follow with Dr Delton CoombesByrum in 12 months or sooner if you have any problems

## 2015-07-29 ENCOUNTER — Ambulatory Visit (INDEPENDENT_AMBULATORY_CARE_PROVIDER_SITE_OTHER): Payer: PPO | Admitting: Cardiology

## 2015-07-29 ENCOUNTER — Encounter: Payer: Self-pay | Admitting: Cardiology

## 2015-07-29 VITALS — BP 128/64 | HR 48 | Ht 66.0 in | Wt 203.2 lb

## 2015-07-29 DIAGNOSIS — I208 Other forms of angina pectoris: Secondary | ICD-10-CM | POA: Diagnosis not present

## 2015-07-29 DIAGNOSIS — Z951 Presence of aortocoronary bypass graft: Secondary | ICD-10-CM

## 2015-07-29 DIAGNOSIS — I10 Essential (primary) hypertension: Secondary | ICD-10-CM

## 2015-07-29 DIAGNOSIS — I25119 Atherosclerotic heart disease of native coronary artery with unspecified angina pectoris: Secondary | ICD-10-CM | POA: Diagnosis not present

## 2015-07-29 DIAGNOSIS — E782 Mixed hyperlipidemia: Secondary | ICD-10-CM

## 2015-07-29 DIAGNOSIS — E785 Hyperlipidemia, unspecified: Secondary | ICD-10-CM

## 2015-07-29 DIAGNOSIS — E669 Obesity, unspecified: Secondary | ICD-10-CM

## 2015-07-29 NOTE — Patient Instructions (Signed)
Schedule for June 2017---Your physician has requested that you have a lexiscan myoview. For further information please visit [email or web address] Please follow instruction sheet, as given.  Will call with results.  NO THERE CHANGES WITH MEDICATIONS   Your physician wants you to follow-up in 12 months with Dr Herbie BaltimoreHARDING.  You will receive a reminder letter in the mail two months in advance. If you don't receive a letter, please call our office to schedule the follow-up appointment.  If you need a refill on your cardiac medications before your next appointment, please call your pharmacy.

## 2015-07-29 NOTE — Progress Notes (Signed)
PCP: Shirlee Latch, MD  Clinic Note: Chief Complaint  Patient presents with  . Follow-up    CAD  . Shortness of Breath    when being very active  . Chest Pain    left side, sharp pain    HPI: Brendan [name] is a 75 y.o. male with a PMH below who presents today for 6 month CAD f/u.   He has a h/o CAD with CABGx3 in 1997.  Unstable Angina in June 2013 - Occluded SVG-Diag with Patent LIMA-LAD & SVG-OM treated medically.  He does have chronic stable angina. He is on Inderal as BB to help Rx tremors along with CAD.   Is coming due to for surveillance Myoview by June 2017.  [name] was last seen in April 2016.  Stable angina Sx.    Recent Hospitalizations: none  Studies Reviewed: none  Interval History: Overall, he really has no major change in his  His cardiac symptoms are pretty stable.  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  He is having to watch over her -- making sure that she does not steal & get back into drugs.   He really isn't very active, still is very difficult to tell if he has true exertional angina. He really is more limited by his leg neuropathy/pain which limits his ability to walk. He doesn't really do enough to have exertional dyspnea. He has very poor balance, and uses a walker now. He gets short of breath when he walks pushing the walker because of a lot of work for him.  He denies any rapid irregular heartbeat/palpitations or arrhythmias. Mild lower extremity edema, no PND or orthopnea. He does have some occasional sharp discomfort in his left-sided chest that is not necessarily associated with any particular activity.  He has not had any concerning anginal type chest discomfort at rest or exertion for which he would use nitroglycerin.  No syncope/near syncope, or TIA/amaurosis fugax symptoms.  He doesn't do enough walking too even though he would have claudication.  ROS: A comprehensive  was performed. Review of Systems  Constitutional: Positive for malaise/fatigue.  HENT: Negative for nosebleeds.   Eyes:       Worsening vision  Respiratory: Positive for shortness of breath (@ baseline).   Cardiovascular:       Per HPI  Musculoskeletal: Positive for back pain, joint pain and falls.  Neurological: Positive for dizziness (positional) and tremors (chronic). Negative for seizures.  Endo/Heme/Allergies: Bruises/bleeds easily.  Psychiatric/Behavioral: Negative for depression. The patient has insomnia. The patient is not nervous/anxious.   All other systems reviewed and are negative.   Past Medical History  Diagnosis Date  . CAD in native artery 1997    Referred for CABG x3 in 2004 for LAD diagonal bifurcation lesion  . S/P CABG x 3 2004     LIMA-LAD, SVG to diagonal, SVG to OM  . Unstable angina pectoris [facility])  June 2013    Cardiac cath: occluded SVG-DI. Patent LIMA-LAD and SVG-OM. EF 45% with apical inferior HK.  . Coronary atherosclerosis of artery bypass graft June 2013    Occluded SVG-D1; Cardiologist Dr. Herbie Baltimore  . Hypertension   . Hyperlipidemia LDL goal <70   . Gastroesophageal reflux disease   . Peripheral neuropathy (HCC)     possible peripheral neuropathy  . Memory loss     Mild  . Gait disorder   . Benign enlargement of  prostate   . Anxiety disorder   . Obesity   . Tremor, essential     On Primidone  . Cervical spinal stenosis   . Glaucoma   . Hiatal hernia     GI: Dr Evette CristalGanem  . Complex regional pain syndrome of right upper extremity     Right wrist; L arm  . Reflex sympathetic dystrophy     Past Surgical History  Procedure Laterality Date  . Coronary artery bypass graft  06/2002    LIMA-LAD, SVG-D1, SVG-OM  . Nm myocar perf wall motion  03/23/2009    protocol:Bruce, normal perfusion in all regions, post-stress EF 72%, exercise capacity 7METS, EKG negative for ischemia.  . Shoulder orthoscopic surgery   06/2003  . Knee surgery Left   . Wrist  ganglion cyst Left   . Transthoracic echocardiogram  April 2014    EF 55-60%; no regional wall motion abnormalities. Relatively normal study normal diastolic parameters/abnormal relaxation  . Left heart catheterization with coronary/graft angiogram N/A 10/13/2011    Procedure: LEFT HEART CATHETERIZATION WITH Isabel CapriceORONARY/GRAFT ANGIOGRAM;  Surgeon: Marykay Lexavid W Harding, MD;  [facility];  Service: Cardiovascular;  Laterality: N/A;   Prior to Admission medications   Medication Sig Start Date End Date Taking? Authorizing Provider  aspirin (LONGS ADULT LOW STRENGTH ASA) 81 MG EC tablet Take 81 mg by mouth daily.     Yes Historical Provider, MD  fish oil-omega-3 fatty acids 1000 MG capsule Take 1 g by mouth 2 (two) times daily.   Yes Historical Provider, MD  furosemide (LASIX) 20 MG tablet Take 20 mg by mouth as needed. 01/28/13  Yes Marykay Lexavid W Harding, MD  gabapentin (NEURONTIN) 300 MG capsule Take 1 capsule (300 mg total) by mouth 3 (three) times daily. 11/12/14  Yes Erasmo DownerAngela M Bacigalupo, MD  Gabapentin, Once-Daily, (GRALISE) 300 MG TABS Take 900 mg by mouth at bedtime. 12/16/14  Yes Erasmo DownerAngela M Bacigalupo, MD  lisinopril (PRINIVIL,ZESTRIL) 10 MG tablet Take 1 tablet (10 mg total) by mouth daily. 11/12/14  Yes Erasmo DownerAngela M Bacigalupo, MD  nitroGLYCERIN (NITROSTAT) 0.4 MG SL tablet Place 1 tablet (0.4 mg total) under the tongue every 5 (five) minutes as needed for chest pain. call 911 if chest pain not better 08/08/11  Yes Charm RingsErin J Honig, MD  omeprazole (PRILOSEC) 20 MG capsule Take 1 capsule (20 mg total) by mouth daily as needed (acid reflux). 11/12/14  Yes Erasmo DownerAngela M Bacigalupo, MD  primidone (MYSOLINE) 250 MG tablet Take 1 tablet (250 mg total) by mouth 2 (two) times daily. 11/12/14  Yes Erasmo DownerAngela M Bacigalupo, MD  propranolol (INDERAL) 80 MG tablet Take 1 tablet (80 mg total) by mouth 2 (two) times daily. 11/12/14  Yes Erasmo DownerAngela M Bacigalupo, MD  simvastatin (ZOCOR) 80 MG tablet Take 1 tablet (80 mg total) by mouth at  bedtime. 06/16/14  Yes Erasmo DownerAngela M Bacigalupo, MD  traMADol (ULTRAM) 50 MG tablet Take 1 tablet (50 mg total) by mouth 3 (three) times daily as needed. 11/05/14  Yes Erasmo DownerAngela M Bacigalupo, MD     No Known Allergies  Social History   Social History  . Marital Status: Married    Spouse Name: N/A  . Number of Children: N/A  . Years of Education: N/A   Social History Main Topics  . Smoking status: Former Smoker -- 3.00 packs/day for 20 years    Types: Cigarettes    Quit date: 04/17/1968  . Smokeless tobacco: Never Used  . Alcohol Use: Yes  Comment: Occasional 2 mixed drinks or 3 beers.  . Drug Use: No  . Sexual Activity: Not Asked   Other Topics Concern  . None   Social History Narrative   Married.  Wife is [place].   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   Former smoker, quit 20 years ago. Does not drink alcohol.   Does not get routine exercise, walks sometimes with a cane.   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 She has no motivation to get back into the work force, and they consulted to watch her to make sure she is not stealing in order to go support her habit. This is really cut back on their independence and has been aboard very difficult for them to feel rested.  Family History  Problem Relation Age of Onset  . Heart disease Mother   . Heart disease Brother   . Tremor Paternal Uncle   . Heart disease Brother     Wt Readings from Last 3 Encounters:  07/29/15 203 lb 3.2 oz (92.171 kg)  06/28/15 200 lb (90.719 kg)  06/08/15 200 lb 11.2 oz (91.037 kg)    PHYSICAL EXAM BP 128/64 mmHg  Pulse 48  Ht  (1.676 m)  Wt 203 lb 3.2 oz (92.171 kg)  BMI 32.81 kg/m2 General appearance: alert, cooperative, appears stated age, NAD; and mildly obese; well-nourished, well-groomed. Answers questions appropriately. Prominent tremor most notable in right hand. HEENT: [place]/AT, EOMI, MMM, anicteric sclera  Neck: no  adenopathy, no carotid bruit and no JVD;  Lungs: CTA B., normal percussion bilaterally and non-labored  Heart: RRR, S1/ S2 normal, no murmur, click, rub or gallop; nondisplaced PMI  Abdomen: soft, non-tender; bowel sounds normal; no masses, no organomegaly;  Extremities: No C/C/ with trace E.Pulses: 2+ and symmetric;  Skin: Mild venous stasis changes found.  Neurologic: Alert, oriented, thought content appropriate; Cranial nerves: normal (II-XII grossly intact)    Adult ECG Report  Rate: 48;  Rhythm: sinus bradycardia and LAFB ( -53), poor R wave progression in precordial leads. Cannot exclude anterior MI, age indeterminate. Otherwise nonspecific ST and T-wave changes in inferior leads.  Narrative Interpretation: stable EKG. No change from prior EKG.   Other studies Reviewed: Additional studies/ records that were reviewed today include:  Recent Labs:  Lab Results  Component Value Date   CHOL 141 11/12/2014   HDL 34* 11/12/2014   LDLCALC 89 11/12/2014   LDLDIRECT 84 08/30/2006   TRIG 92 11/12/2014   CHOLHDL 4.1 11/12/2014   Lab Results  Component Value Date   CREATININE 1.07 11/12/2014   Lab Results  Component Value Date   K 4.9 11/12/2014   Lab Results  Component Value Date   HGBA1C 5.4 06/16/2014     ASSESSMENT / PLAN: Problem List Items Addressed This Visit    S/P CABG x 3 (Chronic)    Due to lack of ability to exert himself because of Parkinson's, we are checking surveillance stress tests. Plan: Lexiscan Myoview.      Relevant Orders   EKG 12-Lead (Completed)   Myocardial Perfusion Imaging   Obesity (BMI 30-39.9) (Chronic)    Unfortunately he continues to be limited as far as mobility goes. Neuropathy and parkinsonism is really slowing him down. Again recommended doing water aerobics/walking.      HYPERTENSION, BENIGN (Chronic)    We will controlled today on current dose of lisinopril and Inderal.      Relevant Orders  EKG 12-Lead (Completed)    Myocardial Perfusion Imaging   HYPERLIPIDEMIA, MIXED (Chronic)   Relevant Orders   EKG 12-Lead (Completed)   Myocardial Perfusion Imaging   Hyperlipidemia LDL goal <70 (Chronic)    On high-dose simvastatin. Has not had labs checked since last summer. If his LDL is still not at goal, I would consider switching to either atorvastatin or rosuvastatin..      CHRONIC STABLE ANGINA (Chronic)    Relatively stable symptom wise. He has not had use any when necessary nitroglycerin. We are possibly missing symptoms because of his lack of ability to exert himself. He is on a good dose of beta blocker using Inderal for his tremor. He is on statin and ACE inhibitor. On aspirin without Plavix.  Plan: Because of physical lack of ability to exert himself, we will continue with routine surveillance stress test. --> Lexiscan Myoview      Atherosclerotic heart disease of native coronary artery with angina pectoris (HCC) - Primary (Chronic)    To date, have been relatively stable -- but not active enough to truly note symptoms. Has been on stable regimen of statin, ACE inhibitor beta blocker. Plan: Lexiscan Myoview      Relevant Orders   EKG 12-Lead (Completed)   Myocardial Perfusion Imaging      PATIENT INSTRUCTIONS Current medicines are reviewed at length with the patient today. (+/- concerns) none The following changes have been made: none Studies Ordered:   Orders Placed This Encounter  Procedures  . Myocardial Perfusion Imaging  . EKG 12-Lead    ROV 12 Months, pending results of Myoview   HARDING, Piedad Climes, M.D., M.S. Interventional Cardiologist   Pager # [phone]

## 2015-07-31 ENCOUNTER — Encounter: Payer: Self-pay | Admitting: Cardiology

## 2015-07-31 NOTE — Assessment & Plan Note (Signed)
Relatively stable symptom wise. He has not had use any when necessary nitroglycerin. We are possibly missing symptoms because of his lack of ability to exert himself. He is on a good dose of beta blocker using Inderal for his tremor. He is on statin and ACE inhibitor. On aspirin without Plavix.  Plan: Because of physical lack of ability to exert himself, we will continue with routine surveillance stress test. --> Lexiscan Myoview

## 2015-07-31 NOTE — Assessment & Plan Note (Signed)
We will controlled today on current dose of lisinopril and Inderal.

## 2015-07-31 NOTE — Assessment & Plan Note (Signed)
On high-dose simvastatin. Has not had labs checked since last summer. If his LDL is still not at goal, I would consider switching to either atorvastatin or rosuvastatin..[name]

## 2015-07-31 NOTE — Assessment & Plan Note (Signed)
Due to lack of ability to exert himself because of Parkinson's, we are checking surveillance stress tests. Plan: Lexiscan Myoview.

## 2015-07-31 NOTE — Assessment & Plan Note (Signed)
Unfortunately he continues to be limited as far as mobility goes. Neuropathy and parkinsonism is really slowing him down. Again recommended doing water aerobics/walking.

## 2015-07-31 NOTE — Assessment & Plan Note (Signed)
To date, have been relatively stable -- but not active enough to truly note symptoms. Has been on stable regimen of statin, ACE inhibitor beta blocker. Plan: [employer] Myov[employer]

## 2015-09-23 ENCOUNTER — Telehealth (HOSPITAL_COMMUNITY): Payer: Self-pay

## 2015-09-23 NOTE — Telephone Encounter (Signed)
Encounter complete. 

## 2015-09-28 ENCOUNTER — Ambulatory Visit (HOSPITAL_COMMUNITY)
Admission: RE | Admit: 2015-09-28 | Discharge: 2015-09-28 | Disposition: A | Discharge status: Home / Self Care | Payer: PPO | Source: Ambulatory Visit | Attending: Cardiovascular Disease | Admitting: Cardiovascular Disease

## 2015-09-28 DIAGNOSIS — Z6832 Body mass index (BMI) 32.0-32.9, adult: Secondary | ICD-10-CM | POA: Insufficient documentation

## 2015-09-28 DIAGNOSIS — I25119 Atherosclerotic heart disease of native coronary artery with unspecified angina pectoris: Secondary | ICD-10-CM | POA: Insufficient documentation

## 2015-09-28 DIAGNOSIS — R5383 Other fatigue: Secondary | ICD-10-CM | POA: Diagnosis not present

## 2015-09-28 DIAGNOSIS — R0602 Shortness of breath: Secondary | ICD-10-CM | POA: Insufficient documentation

## 2015-09-28 DIAGNOSIS — Z951 Presence of aortocoronary bypass graft: Secondary | ICD-10-CM | POA: Insufficient documentation

## 2015-09-28 DIAGNOSIS — Z8249 Family history of ischemic heart disease and other diseases of the circulatory system: Secondary | ICD-10-CM | POA: Diagnosis not present

## 2015-09-28 DIAGNOSIS — R0609 Other forms of dyspnea: Secondary | ICD-10-CM | POA: Insufficient documentation

## 2015-09-28 DIAGNOSIS — E782 Mixed hyperlipidemia: Secondary | ICD-10-CM | POA: Diagnosis not present

## 2015-09-28 DIAGNOSIS — E663 Overweight: Secondary | ICD-10-CM | POA: Insufficient documentation

## 2015-09-28 DIAGNOSIS — Z87891 Personal history of nicotine dependence: Secondary | ICD-10-CM | POA: Insufficient documentation

## 2015-09-28 DIAGNOSIS — R079 Chest pain, unspecified: Secondary | ICD-10-CM | POA: Insufficient documentation

## 2015-09-28 DIAGNOSIS — I10 Essential (primary) hypertension: Secondary | ICD-10-CM

## 2015-09-28 LAB — MYOCARDIAL PERFUSION IMAGING
LV dias vol: 94 mL (ref 62–150)
LV sys vol: 39 mL
Peak HR: 73 {beats}/min
Rest HR: 51 {beats}/min
SDS: 1
SRS: 1
SSS: 2
TID: 0.99

## 2015-09-28 MED ORDER — REGADENOSON 0.4 MG/5ML IV SOLN
0.4000 mg | Freq: Once | INTRAVENOUS | Status: AC
  Administered 2015-09-28: 0.4 mg via INTRAVENOUS

## 2015-09-28 MED ORDER — TECHNETIUM TC 99M TETROFOSMIN IV KIT
30.6000 | PACK | Freq: Once | INTRAVENOUS | Status: AC | PRN
  Administered 2015-09-28: 31 via INTRAVENOUS
  Filled 2015-09-28: qty 31

## 2015-09-28 MED ORDER — AMINOPHYLLINE 25 MG/ML IV SOLN
75.0000 mg | Freq: Once | INTRAVENOUS | Status: AC
  Administered 2015-09-28: 75 mg via INTRAVENOUS

## 2015-09-28 MED ORDER — TECHNETIUM TC 99M TETROFOSMIN IV KIT
10.1000 | PACK | Freq: Once | INTRAVENOUS | Status: AC | PRN
  Administered 2015-09-28: 10.1 via INTRAVENOUS
  Filled 2015-09-28: qty 10

## 2015-09-29 ENCOUNTER — Telehealth: Payer: Self-pay | Admitting: *Deleted

## 2015-09-29 NOTE — Telephone Encounter (Signed)
Spoke to patient and wife . Stress test results given. Continue current medcications Verbalized understanding.

## 2015-09-29 NOTE — Progress Notes (Signed)
Quick Note:  Stress Test looked good!! No sign of significant Heart Artery Disease. Pump function is normal.  Good news!!.  Tamarah Bhullar W, MD  ______ 

## 2015-09-29 NOTE — Telephone Encounter (Signed)
-----   Message from Marykay Lexavid W Harding, MD sent at 09/29/2015 10:07 AM EDT ----- Stress Test looked good!! No sign of significant Heart Artery Disease.  Pump function is normal.  Good news!!.  Marykay LexHARDING,DAVID W, MD

## 2015-11-09 ENCOUNTER — Other Ambulatory Visit: Payer: Self-pay | Admitting: *Deleted

## 2015-11-09 DIAGNOSIS — IMO0002 Reserved for concepts with insufficient information to code with codable children: Secondary | ICD-10-CM

## 2015-11-09 MED ORDER — GABAPENTIN 300 MG PO CAPS: ORAL_CAPSULE | ORAL | 5 refills | Status: DC

## 2015-11-30 ENCOUNTER — Encounter: Payer: Self-pay | Admitting: *Deleted

## 2015-11-30 ENCOUNTER — Ambulatory Visit (INDEPENDENT_AMBULATORY_CARE_PROVIDER_SITE_OTHER): Payer: PPO | Admitting: *Deleted

## 2015-11-30 VITALS — BP 149/71 | HR 55 | Temp 98.4°F | Ht 66.0 in | Wt 200.6 lb

## 2015-11-30 DIAGNOSIS — R251 Tremor, unspecified: Secondary | ICD-10-CM | POA: Diagnosis not present

## 2015-11-30 DIAGNOSIS — G9050 Complex regional pain syndrome I, unspecified: Secondary | ICD-10-CM

## 2015-11-30 DIAGNOSIS — I10 Essential (primary) hypertension: Secondary | ICD-10-CM | POA: Diagnosis not present

## 2015-11-30 DIAGNOSIS — Z23 Encounter for immunization: Secondary | ICD-10-CM | POA: Diagnosis not present

## 2015-11-30 DIAGNOSIS — IMO0002 Reserved for concepts with insufficient information to code with codable children: Secondary | ICD-10-CM

## 2015-11-30 DIAGNOSIS — Z0000 Encounter for general adult medical examination without abnormal findings: Secondary | ICD-10-CM

## 2015-11-30 MED ORDER — GABAPENTIN 300 MG PO CAPS: ORAL_CAPSULE | ORAL | 5 refills | Status: DC

## 2015-11-30 MED ORDER — SIMVASTATIN 80 MG PO TABS
80.0000 mg | ORAL_TABLET | Freq: Every day | ORAL | 11 refills | Status: DC
Start: 2015-11-30 — End: 2016-05-01

## 2015-11-30 MED ORDER — PROPRANOLOL HCL 80 MG PO TABS: 80.0000 mg | ORAL_TABLET | Freq: Two times a day (BID) | ORAL | 5 refills | Status: DC

## 2015-11-30 MED ORDER — LISINOPRIL 10 MG PO TABS: 10.0000 mg | ORAL_TABLET | Freq: Every day | ORAL | 5 refills | Status: DC

## 2015-11-30 MED ORDER — PRIMIDONE 250 MG PO TABS: 250.0000 mg | ORAL_TABLET | Freq: Two times a day (BID) | ORAL | 5 refills | Status: DC

## 2015-11-30 NOTE — Progress Notes (Signed)
Subjective:   Brendan [name] is a 75 y.o. male who presents with wife for an Initial Medicare Annual Wellness Visit.  Cardiac Risk Factors include: advanced age (>955men, 23>65 women);dyslipidemia;hypertension;male gender;sedentary lifestyle;smoking/ tobacco exposure;obesity (BMI >30kg/m2)    Objective:    Today's Vitals   11/30/15 1114  BP: (!) 149/71  Pulse: (!) 55  Temp: 98.4 F (36.9 C)  TempSrc: Oral  SpO2: 99%  Weight: 200 lb 9.6 oz (91 kg)  Height: 5\' 6"  (1.676 m)   Body mass index is 32.38 kg/m.  Current Medications (verified) Outpatient Encounter Prescriptions as of 11/30/2015  Medication Sig  . aspirin (LONGS ADULT LOW STRENGTH ASA) 81 MG EC tablet Take 81 mg by mouth daily.    . fish oil-omega-3 fatty acids 1000 MG capsule Take 1 g by mouth 2 (two) times daily.  [name]. gabapentin (NEURONTIN) 300 MG capsule Take 300mg  qAM and mid day and take 600mg  qhs. Can increase to 600mg  TID.  [name]. lisinopril (PRINIVIL,ZESTRIL) 10 MG tablet Take 1 tablet (10 mg total) by mouth daily.  . nitroGLYCERIN (NITROSTAT) 0.4 MG SL tablet Place 1 tablet (0.4 mg total) under the tongue every 5 (five) minutes as needed for chest pain. call 911 if chest pain not better  . omeprazole (PRILOSEC) 20 MG capsule Take 1 capsule (20 mg total) by mouth daily as needed (acid reflux).  . primidone (MYSOLINE) 250 MG tablet Take 1 tablet (250 mg total) by mouth 2 (two) times daily.  . propranolol (INDERAL) 80 MG tablet Take 1 tablet (80 mg total) by mouth 2 (two) times daily.  . simvastatin (ZOCOR) 80 MG tablet Take 1 tablet (80 mg total) by mouth at bedtime.  . [DISCONTINUED] gabapentin (NEURONTIN) 300 MG capsule Take 300mg  qAM and mid day and take 600mg  qhs. Can increase to 600mg  TID.  . [DISCONTINUED] lisinopril (PRINIVIL,ZESTRIL) 10 MG tablet Take 1 tablet (10 mg total) by mouth daily.  . [DISCONTINUED] primidone (MYSOLINE) 250 MG tablet Take 1 tablet (250 mg total) by mouth 2 (two) times daily.  .  [DISCONTINUED] propranolol (INDERAL) 80 MG tablet Take 1 tablet (80 mg total) by mouth 2 (two) times daily.  . [DISCONTINUED] simvastatin (ZOCOR) 80 MG tablet Take 1 tablet (80 mg total) by mouth at bedtime.   Facility-Administered Encounter Medications as of 11/30/2015  Medication  . 0.9 %  sodium chloride infusion  . sodium chloride 0.9 % injection 3 mL  . [DISCONTINUED] pneumococcal 23 valent vaccine (PNU-IMMUNE) injection 0.5 mL    Allergies (verified) Review of patient's allergies indicates no known allergies.   History: Past Medical History:  Diagnosis Date  . Anxiety disorder   . Benign enlargement of prostate   . CAD in native artery 1997   Referred for CABG x3 in 2004 for LAD diagonal bifurcation lesion  . Cervical spinal stenosis   . Complex regional pain syndrome of right upper extremity    Right wrist; [name] arm  . Coronary atherosclerosis of artery bypass graft June 2013   Occluded SVG-D1; Cardiologist Dr. Herbie BaltimoreHarding  . Gait disorder   . Gastroesophageal reflux disease   . Glaucoma   . Hiatal hernia    GI: Dr Evette CristalGanem  . Hyperlipidemia LDL goal <70   . Hypertension   . Memory loss    Mild  . Obesity   . Peripheral neuropathy (HCC)    possible peripheral neuropathy  . Reflex sympathetic dystrophy   . S/P CABG x 3 2004    LIMA-LAD, SVG  to diagonal, SVG to OM  . Tremor, essential    On Primidone  . Unstable angina pectoris [facility])  June 2013   Cardiac cath: occluded SVG-DI. Patent LIMA-LAD and SVG-OM. EF 45% with apical inferior HK.   Past Surgical History:  Procedure Laterality Date  . CORONARY ARTERY BYPASS GRAFT  06/2002   LIMA-LAD, SVG-D1, SVG-OM  . KNEE SURGERY Left   . LEFT HEART CATHETERIZATION WITH CORONARY/GRAFT ANGIOGRAM N/A 10/13/2011   Procedure: LEFT HEART CATHETERIZATION WITH Isabel Caprice;  Surgeon: Marykay Lex, MD;  Location[facility] CATH LAB;  Service: Cardiovascular;  Laterality: N/A;  . NM MYOCAR PERF WALL MOTION  03/23/2009    protocol:Bruce, normal perfusion in all regions, post-stress EF 72%, exercise capacity , EKG negative for ischemia.  . Shoulder Orthoscopic Surgery   06/2003  . TRANSTHORACIC ECHOCARDIOGRAM  April 2014   EF 55-60%; no regional wall motion abnormalities. Relatively normal study normal diastolic parameters/abnormal relaxation  . wrist ganglion cyst Left    Family History  Problem Relation Age of Onset  . Heart disease Mother   . Heart disease Brother   . Tremor Paternal Uncle   . Heart disease Brother   . Drug abuse Daughter   . Hepatitis C Daughter    Social History   Occupational History  . Not on file.   Social History Main Topics  . Smoking status: Former Smoker    Packs/day: 3.00    Years: 20.00    Types: Cigarettes    Quit date: 04/17/1968  . Smokeless tobacco: Never Used  . Alcohol use No     Comment: Occasional 2 mixed drinks or 3 beers.  . Drug use: No  . Sexual activity: No   Tobacco Counseling Counseling given: Yes   Activities of Daily Living In your present state of health, do you have any difficulty performing the following activities: 11/30/2015 11/30/2015  Hearing? - Y  Vision? - N  Difficulty concentrating or making decisions? - N  Walking or climbing stairs? - Y  Dressing or bathing? - N  Doing errands, shopping? - N  Quarry manager and eating ? - N  Using the Toilet? - N  In the past six months, have you accidently leaked urine? - N  Do you have problems with loss of bowel control? - Y  Managing your Medications? - N  Managing your Finances? - N  Housekeeping or managing your Housekeeping? (No Data) N  Some recent data might be hidden   Home Safety:  My home has a working smoke alarm:  Yes, Times 3           My home throw rugs have been fastened down to the floor or removed:  Yes, one removed and one fastened down I have non-slip mats in the bathtub and shower:  Yes         All my home's stairs have railings or bannisters: One level home with  4 and 5 outside stairs with railings          My home's floors, stairs and hallways are free from clutter, wires and cords:  Yes       Immunizations and Health Maintenance Immunization History  Administered Date(s) Administered  . Influenza Split 01/11/2011, 01/02/2012  . Influenza Whole 02/07/2008, 01/29/2009, 01/18/2010  . Influenza,inj,Quad PF,36+ Mos 01/31/2013, 01/12/2014, 01/06/2015  . Pneumococcal Polysaccharide-23 11/15/1996, 02/27/2011  . Td 05/18/2002  . Zoster 08/10/2011   Health Maintenance Due  Topic Date Due  . PNA vac  Low Risk Adult (2 of 2 - PCV13) 02/27/2012  . TETANUS/TDAP  05/18/2012  . COLONOSCOPY  09/22/2015  . INFLUENZA VACCINE  11/16/2015  Prevnar given today Patient will go to local pharmacy to receive TDaP Patient aware that influenza vaccins begin 12/17/2015 Patient will call to schedule colonoscopy  Patient Care Team: Erasmo DownerAngela M Bacigalupo, MD as PCP - General (Family Medicine) Storm FriskPatrick E Wright, MD as Consulting Physician (Pulmonary Disease) Elliot Cousinharles Byrnes, OD as Referring Physician (Optometry) Marykay Lexavid W Harding, MD as Consulting Physician (Cardiology)  Indicate any recent Medical Services you may have received from other than [facility] providers in the past year (date may be approximate).    Assessment:   This is a routine wellness examination for Brendan [name].   Hearing/Vision screen  Hearing Screening   Method: Audiometry   125Hz  250Hz  500Hz  1000Hz  2000Hz  3000Hz  4000Hz  6000Hz  8000Hz   Right ear:   40 40 Fail  Fail    Left ear:   Fail 40 Fail  Fail    Discussed being evaluated for hearing aids; patient states he won't wear them.  Dietary issues and exercise activities discussed: Current Exercise Habits: The patient does not participate in regular exercise at present, Exercise limited by: orthopedic condition(s);Other - see comments ("legs get weak when walk a lot and low energy.")  Goals    . Blood Pressure < 150/90      Depression Screen PHQ 2/9 Scores  11/30/2015 06/08/2015 11/12/2014 06/16/2014  PHQ - 2 Score 0 0 0 0    Fall Risk Fall Risk  11/30/2015 11/12/2014 06/16/2014 01/12/2014 08/21/2013  Falls in the past year? No No No No No  Number falls in past yr: - - - - -  Injury with Fall? - - - - -  Risk for fall due to : - - - - -    Cognitive Function: Mini-Cog  Passed with score 3/5 Patient unable to draw clock due to bilateral hand tremors.  TUG Test:  Done in 14 seconds. Discussed fall prevention. Patient does not use cane or walker at this time but keeps cane near his chair at home in case he needs it.   Screening Tests Health Maintenance  Topic Date Due  . PNA vac Low Risk Adult (2 of 2 - PCV13) 02/27/2012  . TETANUS/TDAP  05/18/2012  . COLONOSCOPY  09/22/2015  . INFLUENZA VACCINE  11/16/2015  . ZOSTAVAX  Completed        Plan:     During the course of the visit Brendan [name] was educated and counseled about the following appropriate screening and preventive services:   Vaccines to include Pneumoccal, Influenza, Td, Zostavax  Colorectal cancer screening  Cardiovascular disease screening  Diabetes screening  Abdominal Aorta ultrasound  Patient Instructions (the written plan) were given to the patient.   Fredderick SeveranceDUCATTE, Brendan L, RN   11/30/2015    I have reviewed this visit and discussed with Brendan LemmingLauren Ducatte, RN, BSN, and agree with her documentation.   Willadean CarolKaty Mayo, MD PGY-2

## 2015-11-30 NOTE — Patient Instructions (Addendum)
 Fall Prevention in the Home  Falls can cause injuries. They can happen to people of all ages. There are many things you can do to make your home safe and to help prevent falls.  WHAT CAN I DO ON THE OUTSIDE OF MY HOME?  Regularly fix the edges of walkways and driveways and fix any cracks.  Remove anything that might make you trip as you walk through a door, such as a raised step or threshold.  Trim any bushes or trees on the path to your home.  Use bright outdoor lighting.  Clear any walking paths of anything that might make someone trip, such as rocks or tools.  Regularly check to see if handrails are loose or broken. Make sure that both sides of any steps have handrails.  Any raised decks and porches should have guardrails on the edges.  Have any leaves, snow, or ice cleared regularly.  Use sand or salt on walking paths during winter.  Clean up any spills in your garage right away. This includes oil or grease spills. WHAT CAN I DO IN THE BATHROOM?   Use night lights.  Install grab bars by the toilet and in the tub and shower. Do not use towel bars as grab bars.  Use non-skid mats or decals in the tub or shower.  If you need to sit down in the shower, use a plastic, non-slip stool.  Keep the floor dry. Clean up any water that spills on the floor as soon as it happens.  Remove soap buildup in the tub or shower regularly.  Attach bath mats securely with double-sided non-slip rug tape.  Do not have throw rugs and other things on the floor that can make you trip. WHAT CAN I DO IN THE BEDROOM?  Use night lights.  Make sure that you have a light by your bed that is easy to reach.  Do not use any sheets or blankets that are too big for your bed. They should not hang down onto the floor.  Have a firm chair that has side arms. You can use this for support while you get dressed.  Do not have throw rugs and other things on the floor that can make you trip. WHAT CAN I DO  IN THE KITCHEN?  Clean up any spills right away.  Avoid walking on wet floors.  Keep items that you use a lot in easy-to-reach places.  If you need to reach something above you, use a strong step stool that has a grab bar.  Keep electrical cords out of the way.  Do not use floor polish or wax that makes floors slippery. If you must use wax, use non-skid floor wax.  Do not have throw rugs and other things on the floor that can make you trip. WHAT CAN I DO WITH MY STAIRS?  Do not leave any items on the stairs.  Make sure that there are handrails on both sides of the stairs and use them. Fix handrails that are broken or loose. Make sure that handrails are as long as the stairways.  Check any carpeting to make sure that it is firmly attached to the stairs. Fix any carpet that is loose or worn.  Avoid having throw rugs at the top or bottom of the stairs. If you do have throw rugs, attach them to the floor with carpet tape.  Make sure that you have a light switch at the top of the stairs and the bottom of the   stairs. If you do not have them, ask someone to add them for you. WHAT ELSE CAN I DO TO HELP PREVENT FALLS?  Wear shoes that:  Do not have high heels.  Have rubber bottoms.  Are comfortable and fit you well.  Are closed at the toe. Do not wear sandals.  If you use a stepladder:  Make sure that it is fully opened. Do not climb a closed stepladder.  Make sure that both sides of the stepladder are locked into place.  Ask someone to hold it for you, if possible.  Clearly mark and make sure that you can see:  Any grab bars or handrails.  First and last steps.  Where the edge of each step is.  Use tools that help you move around (mobility aids) if they are needed. These include:  Canes.  Walkers.  Scooters.  Crutches.  Turn on the lights when you go into a dark area. Replace any light bulbs as soon as they burn out.  Set up your furniture so you have a clear  path. Avoid moving your furniture around.  If any of your floors are uneven, fix them.  If there are any pets around you, be aware of where they are.  Review your medicines with your doctor. Some medicines can make you feel dizzy. This can increase your chance of falling. Ask your doctor what other things that you can do to help prevent falls.   This information is not intended to replace advice given to you by your health care provider. Make sure you discuss any questions you have with your health care provider.   Document Released: 01/28/2009 Document Revised: 08/18/2014 Document Reviewed: 05/08/2014 Elsevier Interactive Patient Education 2016 Elsevier Inc[employer]  Health Maintenance, Male A healthy lifestyle and preventative care can promote health and wellness.  Maintain regular health, dental, and eye exams.  Eat a healthy diet. Foods like vegetables, fruits, whole grains, low-fat dairy products, and lean protein foods contain the nutrients you need and are low in calories. Decrease your intake of foods high in solid fats, added sugars, and salt. Get information about a proper diet from your health care provider, if necessary.  Regular physical exercise is one of the most important things you can do for your health. Most adults should get at least 150 minutes of moderate-intensity exercise (any activity that increases your heart rate and causes you to sweat) each week. In addition, most adults need muscle-strengthening exercises on 2 or more days a week.   Maintain a healthy weight. The body mass index (BMI) is a screening tool to identify possible weight problems. It provides an estimate of body fat based on height and weight. Your health care provider can find your BMI and can help you achieve or maintain a healthy weight. For males 20 years and older:  A BMI below 18.5 is considered underweight.  A BMI of 18.5 to 24.9 is normal.  A BMI of 25 to 29.9 is considered overweight.  A BMI  of 30 and above is considered obese.  Maintain normal blood lipids and cholesterol by exercising and minimizing your intake of saturated fat. Eat a balanced diet with plenty of fruits and vegetables. Blood tests for lipids and cholesterol should begin at age 20 and be repeated every 5 years. If your lipid or cholesterol levels are high, you are over age 50, or you are at high risk for heart disease, you may need your cholesterol levels checked more frequently.Ongoing high lipid   and cholesterol levels should be treated with medicines if diet and exercise are not working.  If you smoke, find out from your health care provider how to quit. If you do not use tobacco, do not start.  Lung cancer screening is recommended for adults aged 55-80 years who are at high risk for developing lung cancer because of a history of smoking. A yearly low-dose CT scan of the lungs is recommended for people who have at least a 30-pack-year history of smoking and are current smokers or have quit within the past 15 years. A pack year of smoking is smoking an average of 1 pack of cigarettes a day for 1 year (for example, a 30-pack-year history of smoking could mean smoking 1 pack a day for 30 years or 2 packs a day for 15 years). Yearly screening should continue until the smoker has stopped smoking for at least 15 years. Yearly screening should be stopped for people who develop a health problem that would prevent them from having lung cancer treatment.  If you choose to drink alcohol, do not have more than 2 drinks per day. One drink is considered to be 12 oz (360 mL) of beer, 5 oz (150 mL) of wine, or 1.5 oz (45 mL) of liquor.  Avoid the use of street drugs. Do not share needles with anyone. Ask for help if you need support or instructions about stopping the use of drugs.  High blood pressure causes heart disease and increases the risk of stroke. High blood pressure is more likely to develop in:  People who have blood  pressure in the end of the normal range (100-139/85-89 mm Hg).  People who are overweight or obese.  People who are African American.  If you are 18-39 years of age, have your blood pressure checked every 3-5 years. If you are 40 years of age or older, have your blood pressure checked every year. You should have your blood pressure measured twice--once when you are at a hospital or clinic, and once when you are not at a hospital or clinic. Record the average of the two measurements. To check your blood pressure when you are not at a hospital or clinic, you can use:  An automated blood pressure machine at a pharmacy.  A home blood pressure monitor.  If you are 45-79 years old, ask your health care provider if you should take aspirin to prevent heart disease.  Diabetes screening involves taking a blood sample to check your fasting blood sugar level. This should be done once every 3 years after age 45 if you are at a normal weight and without risk factors for diabetes. Testing should be considered at a younger age or be carried out more frequently if you are overweight and have at least 1 risk factor for diabetes.  Colorectal cancer can be detected and often prevented. Most routine colorectal cancer screening begins at the age of 50 and continues through age 75. However, your health care provider may recommend screening at an earlier age if you have risk factors for colon cancer. On a yearly basis, your health care provider may provide home test kits to check for hidden blood in the stool. A small camera at the end of a tube may be used to directly examine the colon (sigmoidoscopy or colonoscopy) to detect the earliest forms of colorectal cancer. Talk to your health care provider about this at age 50 when routine screening begins. A direct exam of the colon should be   repeated every 5-10 years through age 75, unless early forms of precancerous polyps or small growths are found.  People who are at an  increased risk for hepatitis B should be screened for this virus. You are considered at high risk for hepatitis B if:  You were born in a country where hepatitis B occurs often. Talk with your health care provider about which countries are considered high risk.  Your parents were born in a high-risk country and you have not received a shot to protect against hepatitis B (hepatitis B vaccine).  You have HIV or AIDS.  You use needles to inject street drugs.  You live with, or have sex with, someone who has hepatitis B.  You are a man who has sex with other men (MSM).  You get hemodialysis treatment.  You take certain medicines for conditions like cancer, organ transplantation, and autoimmune conditions.  Hepatitis C blood testing is recommended for all people born from 1945 through 1965 and any individual with known risk factors for hepatitis C.  Healthy men should no longer receive prostate-specific antigen (PSA) blood tests as part of routine cancer screening. Talk to your health care provider about prostate cancer screening.  Testicular cancer screening is not recommended for adolescents or adult males who have no symptoms. Screening includes self-exam, a health care provider exam, and other screening tests. Consult with your health care provider about any symptoms you have or any concerns you have about testicular cancer.  Practice safe sex. Use condoms and avoid high-risk sexual practices to reduce the spread of sexually transmitted infections (STIs).  You should be screened for STIs, including gonorrhea and chlamydia if:  You are sexually active and are younger than 24 years.  You are older than 24 years, and your health care provider tells you that you are at risk for this type of infection.  Your sexual activity has changed since you were last screened, and you are at an increased risk for chlamydia or gonorrhea. Ask your health care provider if you are at risk.  If you are at  risk of being infected with HIV, it is recommended that you take a prescription medicine daily to prevent HIV infection. This is called pre-exposure prophylaxis (PrEP). You are considered at risk if:  You are a man who has sex with other men (MSM).  You are a heterosexual man who is sexually active with multiple partners.  You take drugs by injection.  You are sexually active with a partner who has HIV.  Talk with your health care provider about whether you are at high risk of being infected with HIV. If you choose to begin PrEP, you should first be tested for HIV. You should then be tested every 3 months for as long as you are taking PrEP.  Use sunscreen. Apply sunscreen liberally and repeatedly throughout the day. You should seek shade when your shadow is shorter than you. Protect yourself by wearing long sleeves, pants, a wide-brimmed hat, and sunglasses year round whenever you are outdoors.  Tell your health care provider of new moles or changes in moles, especially if there is a change in shape or color. Also, tell your health care provider if a mole is larger than the size of a pencil eraser.  A one-time screening for abdominal aortic aneurysm (AAA) and surgical repair of large AAAs by ultrasound is recommended for men aged 65-75 years who are current or former smokers.  Stay current with your vaccines (immunizations).     This information is not intended to replace advice given to you by your health care provider. Make sure you discuss any questions you have with your health care provider.   Document Released: 09/30/2007 Document Revised: 04/24/2014 Document Reviewed: 08/29/2010 Elsevier Interactive Patient Education 2016 Elsevier Inc.   Hearing Loss Hearing loss is a partial or total loss of the ability to hear. This can be temporary or permanent, and it can happen in one or both ears. Hearing loss may be referred to as deafness. Medical care is necessary to treat hearing loss  properly and to prevent the condition from getting worse. Your hearing may partially or completely come back, depending on what caused your hearing loss and how severe it is. In some cases, hearing loss is permanent. CAUSES Common causes of hearing loss include:   Too much wax in the ear canal.   Infection of the ear canal or middle ear.   Fluid in the middle ear.   Injury to the ear or surrounding area.   An object stuck in the ear.   Prolonged exposure to loud sounds, such as music.  Less common causes of hearing loss include:   Tumors in the ear.   Viral or bacterial infections, such as meningitis.   A hole in the eardrum (perforated eardrum).  Problems with the hearing nerve that sends signals between the brain and the ear.  Certain medicines.  SYMPTOMS  Symptoms of this condition may include:  Difficulty telling the difference between sounds.  Difficulty following a conversation when there is background noise.  Lack of response to sounds in your environment. This may be most noticeable when you do not respond to startling sounds.  Needing to turn up the volume on the television, radio, etc.  Ringing in the ears.  Dizziness.  Pain in the ears. DIAGNOSIS This condition is diagnosed based on a physical exam and a hearing test (audiometry). The audiometry test will be performed by a hearing specialist (audiologist). You may also be referred to an ear, nose, and throat (ENT) specialist (otolaryngologist).  TREATMENT Treatment for recent onset of hearing loss may include:   Ear wax removal.   Being prescribed medicines to prevent infection (antibiotics).   Being prescribed medicines to reduce inflammation (corticosteroids).  HOME CARE INSTRUCTIONS  If you were prescribed an antibiotic medicine, take it as told by your health care provider. Do not stop taking the antibiotic even if you start to feel better.  Take over-the-counter and prescription  medicines only as told by your health care provider.  Avoid loud noises.   Return to your normal activities as told by your health care provider. Ask your health care provider what activities are safe for you.  Keep all follow-up visits as told by your health care provider. This is important. SEEK MEDICAL CARE IF:   You feel dizzy.   You develop new symptoms.   You vomit or feel nauseous.   You have a fever.  SEEK IMMEDIATE MEDICAL CARE IF:  You develop sudden changes in your vision.   You have severe ear pain.   You have new or increased weakness.  You have a severe headache.   This information is not intended to replace advice given to you by your health care provider. Make sure you discuss any questions you have with your health care provider.   Document Released: 04/03/2005 Document Revised: 12/23/2014 Document Reviewed: 08/19/2014 Elsevier Interactive Patient Education 2016 Elsevier Inc.  

## 2016-01-04 ENCOUNTER — Ambulatory Visit: Payer: Self-pay

## 2016-01-05 ENCOUNTER — Ambulatory Visit (INDEPENDENT_AMBULATORY_CARE_PROVIDER_SITE_OTHER): Payer: PPO | Admitting: Family Medicine

## 2016-01-05 ENCOUNTER — Encounter: Payer: Self-pay | Admitting: Family Medicine

## 2016-01-05 ENCOUNTER — Ambulatory Visit (INDEPENDENT_AMBULATORY_CARE_PROVIDER_SITE_OTHER): Payer: PPO | Admitting: *Deleted

## 2016-01-05 VITALS — BP 130/84 | HR 56 | Temp 98.6°F | Ht 66.0 in | Wt 200.0 lb

## 2016-01-05 DIAGNOSIS — IMO0002 Reserved for concepts with insufficient information to code with codable children: Secondary | ICD-10-CM

## 2016-01-05 DIAGNOSIS — R0981 Nasal congestion: Secondary | ICD-10-CM | POA: Diagnosis not present

## 2016-01-05 DIAGNOSIS — G9050 Complex regional pain syndrome I, unspecified: Secondary | ICD-10-CM

## 2016-01-05 DIAGNOSIS — Z23 Encounter for immunization: Secondary | ICD-10-CM | POA: Diagnosis not present

## 2016-01-05 DIAGNOSIS — I10 Essential (primary) hypertension: Secondary | ICD-10-CM

## 2016-01-05 NOTE — Assessment & Plan Note (Signed)
Doing well Continue gabapentin at current dose 600mg  BID - consider increase to TID if necessary in future

## 2016-01-05 NOTE — Patient Instructions (Signed)
Nice to see you again today. Continue taking your current medications. I'm glad that your pain is well controlled. Your blood pressure is good today. We are getting some labs and someone will call you or send you a letter with the results when they're available.  I think your head congestion is related to allergies most likely. There are medicines that you can take for this if you decide to in the future. Unless it is bothersome, there is nothing to worry about.  Take care, Dr. BLeonard Schwartz

## 2016-01-05 NOTE — Assessment & Plan Note (Signed)
Well controlled currently BMET today Continue lisinopril F/u in 6 months

## 2016-01-05 NOTE — Assessment & Plan Note (Signed)
Likely related to allergic rhinitis as it is worst when mowing grass Offered zyrtec and flonase but patient not interested in medications at this time

## 2016-01-05 NOTE — Progress Notes (Signed)
   Subjective:   Brendan [name] is a 75 y.o. male with a history of HTN, CAD, GERD, complex regional pain syndrome, HLD, obesity, essential tremor here for follow-up of pain, HTN.  Complex regional pain syndrome: . - discontinued tramadol at last visit - gabapentin 600mg  BID - pain seems well controlled - gets a little sleepy after taking gabapentin and naps throughout the day, but doesn't affect function  HTN: - Medications: lisinopril 10mg  daily - Compliance: good - Checking BP at home: no - Denies any SOB, CP (except intermittent chronic stable angina), vision changes, LE edema, medication SEs, or symptoms of hypotension - Diet: doesn't eat much - "eats like a bird" - Exercise: none  Head congestion  - intermittent for months - sometimes with cough - non-productive - seems worse when mowing grass - doesn't take anything for allergies - doesn't want to take any other medications  Review of Systems:  Per HPI.   Social History: former smoker - quit in 1970  Objective:  BP 130/84   Pulse (!) 56   Temp 98.6 F (37 C) (Oral)   Ht 5\' 6"  (1.676 m)   Wt 200 lb (90.7 kg)   SpO2 96%   BMI 32.28 kg/m   Gen:  75 y.o. male in NAD HEENT: NCAT, MMM, EOMI, PERRL, anicteric sclerae, OP clear CV: RRR, no MRG Resp: Non-labored, CTAB, no wheezes noted Ext: WWP, no edema MSK: Resting fine tremor noted in bilateral arms, worse with intention. No obvious deformities. Sensation and strength intact Neuro: Alert and oriented, speech normal      Chemistry      Component Value Date/Time   NA 141 11/12/2014 1510   NA 142 11/27/2012 1133   K 4.9 11/12/2014 1510   CL 104 11/12/2014 1510   CO2 27 11/12/2014 1510   BUN 14 11/12/2014 1510   BUN 13 11/27/2012 1133   CREATININE 1.07 11/12/2014 1510      Component Value Date/Time   CALCIUM 9.2 11/12/2014 1510   ALKPHOS 106 01/12/2014 0948   AST 13 01/12/2014 0948   ALT 13 01/12/2014 0948   BILITOT 0.6 01/12/2014 0948      Lab  Results  Component Value Date   WBC 5.8 01/12/2014   HGB 14.3 01/12/2014   HCT 41.5 01/12/2014   MCV 98.3 01/12/2014   PLT 152 01/12/2014   Lab Results  Component Value Date   TSH 11.962 (H) 01/12/2014    Assessment & Plan:     Brendan [name] is a 75 y.o. male here for follow-up of the following  HYPERTENSION, BENIGN Well controlled currently BMET today Continue lisinopril F/u in 6 months   Head congestion Likely related to allergic rhinitis as it is worst when mowing grass Offered zyrtec and flonase but patient not interested in medications at this time  Complex regional pain syndrome Doing well Continue gabapentin at current dose 600mg  BID - consider increase to TID if necessary in future  Relayed stress test result comments from Dr Herbie BaltimoreHarding.   Erasmo DownerAngela M Garo [place], MD MPH PGY-3,  [facility] 01/05/2016  3:57 PM

## 2016-01-06 ENCOUNTER — Encounter: Payer: Self-pay | Admitting: Family Medicine

## 2016-01-06 LAB — BASIC METABOLIC PANEL WITH GFR
BUN: 9 mg/dL (ref 7–25)
CO2: 25 mmol/L (ref 20–31)
Calcium: 8.7 mg/dL (ref 8.6–10.3)
Chloride: 107 mmol/L (ref 98–110)
Creat: 1.05 mg/dL (ref 0.70–1.18)
GFR, Est African American: 81 mL/min (ref >=60)
GFR, Est Non African American: 70 mL/min (ref >=60)
Glucose, Bld: 92 mg/dL (ref 65–99)
Potassium: 4.5 mmol/L (ref 3.5–5.3)
Sodium: 144 mmol/L (ref 135–146)

## 2016-04-12 DIAGNOSIS — H2513 Age-related nuclear cataract, bilateral: Secondary | ICD-10-CM | POA: Diagnosis not present

## 2016-05-01 ENCOUNTER — Ambulatory Visit (INDEPENDENT_AMBULATORY_CARE_PROVIDER_SITE_OTHER): Payer: PPO | Admitting: Family Medicine

## 2016-05-01 ENCOUNTER — Encounter: Payer: Self-pay | Admitting: Family Medicine

## 2016-05-01 VITALS — BP 100/70 | HR 54 | Temp 97.6°F | Ht 66.0 in | Wt 206.2 lb

## 2016-05-01 DIAGNOSIS — Z1211 Encounter for screening for malignant neoplasm of colon: Secondary | ICD-10-CM

## 2016-05-01 DIAGNOSIS — E785 Hyperlipidemia, unspecified: Secondary | ICD-10-CM

## 2016-05-01 DIAGNOSIS — G9050 Complex regional pain syndrome I, unspecified: Secondary | ICD-10-CM | POA: Diagnosis not present

## 2016-05-01 DIAGNOSIS — E669 Obesity, unspecified: Secondary | ICD-10-CM

## 2016-05-01 DIAGNOSIS — R251 Tremor, unspecified: Secondary | ICD-10-CM | POA: Diagnosis not present

## 2016-05-01 DIAGNOSIS — I10 Essential (primary) hypertension: Secondary | ICD-10-CM

## 2016-05-01 DIAGNOSIS — G20 Parkinson's disease: Secondary | ICD-10-CM

## 2016-05-01 LAB — LIPID PANEL
Cholesterol: 148 mg/dL (ref <200)
HDL: 42 mg/dL (ref >40)
LDL Cholesterol: 88 mg/dL (ref <100)
Total CHOL/HDL Ratio: 3.5 Ratio (ref <5.0)
Triglycerides: 89 mg/dL (ref <150)
VLDL: 18 mg/dL (ref <30)

## 2016-05-01 LAB — COMPLETE METABOLIC PANEL WITH GFR
ALT: 10 U/L (ref 9–46)
AST: 14 U/L (ref 10–35)
Albumin: 4.2 g/dL (ref 3.6–5.1)
Alkaline Phosphatase: 112 U/L (ref 40–115)
BUN: 15 mg/dL (ref 7–25)
CO2: 31 mmol/L (ref 20–31)
Calcium: 9.1 mg/dL (ref 8.6–10.3)
Chloride: 103 mmol/L (ref 98–110)
Creat: 1.02 mg/dL (ref 0.70–1.18)
GFR, Est African American: 84 mL/min (ref >=60)
GFR, Est Non African American: 73 mL/min (ref >=60)
Glucose, Bld: 133 mg/dL — ABNORMAL HIGH (ref 65–99)
Potassium: 5 mmol/L (ref 3.5–5.3)
Sodium: 141 mmol/L (ref 135–146)
Total Bilirubin: 0.5 mg/dL (ref 0.2–1.2)
Total Protein: 6.7 g/dL (ref 6.1–8.1)

## 2016-05-01 LAB — POCT GLYCOSYLATED HEMOGLOBIN (HGB A1C): Hemoglobin A1C: 5.6

## 2016-05-01 MED ORDER — FUROSEMIDE 20 MG PO TABS: 20.0000 mg | ORAL_TABLET | Freq: Every day | ORAL | 3 refills | Status: DC | PRN

## 2016-05-01 MED ORDER — PROPRANOLOL HCL 80 MG PO TABS: 80.0000 mg | ORAL_TABLET | Freq: Two times a day (BID) | ORAL | 5 refills | Status: DC

## 2016-05-01 MED ORDER — SIMVASTATIN 80 MG PO TABS: 80.0000 mg | ORAL_TABLET | Freq: Every day | ORAL | 11 refills | Status: DC

## 2016-05-01 MED ORDER — GABAPENTIN 300 MG PO CAPS: ORAL_CAPSULE | ORAL | 5 refills | Status: DC

## 2016-05-01 MED ORDER — PRIMIDONE 250 MG PO TABS: 250.0000 mg | ORAL_TABLET | Freq: Two times a day (BID) | ORAL | 5 refills | Status: DC

## 2016-05-01 MED ORDER — LISINOPRIL 10 MG PO TABS: 10.0000 mg | ORAL_TABLET | Freq: Every day | ORAL | 5 refills | Status: DC

## 2016-05-01 MED ORDER — NITROGLYCERIN 0.4 MG SL SUBL: 0.4000 mg | SUBLINGUAL_TABLET | SUBLINGUAL | 3 refills | Status: DC | PRN

## 2016-05-01 MED ORDER — OMEPRAZOLE 20 MG PO CPDR: 20.0000 mg | DELAYED_RELEASE_CAPSULE | Freq: Every day | ORAL | 3 refills | Status: DC | PRN

## 2016-05-01 NOTE — Progress Notes (Signed)
Subjective:   Brendan [name] is a 76 y.o. male with a history of HTN, CAD, GERD, complex regional pain syndrome, HLD, obesity, essential tremor here for follow-up of pain, HTN  Complex regional pain syndrome: . - discontinued tramadol in 2017 - gabapentin 600mg  BID - pain seems well controlled - gets a little sleepy after taking gabapentin and naps throughout the day, but doesn't affect function  HTN: - Medications: lisinopril 10mg  daily - Compliance: good - Checking BP at home: no - Denies any SOB, CP (except intermittent chronic stable angina), vision changes, LE edema, medication SEs, or symptoms of hypotension - Diet: eats a lot of junk food - Exercise: none  HLD - medications: fish oil, simvastatin 80mg  daily - compliance: good - medication SEs: none  Due for colonoscopy - last one in 2007 per report  BM soon after eating For many years 2 x/day No loose stool No N/V/D, no abd pain  Review of Systems:  Per HPI.   Social History: former smoker  Objective:  BP 100/70 (BP Location: Left Arm, Patient Position: Sitting, Cuff Size: Normal)   Pulse (!) 54   Temp 97.6 F (36.4 C) (Oral)   Ht 5\' 6"  (1.676 m)   Wt 206 lb 3.2 oz (93.5 kg)   SpO2 98%   BMI 33.28 kg/m   Gen:  76 y.o. male in NAD HEENT: NCAT, MMM, EOMI, PERRL, anicteric sclerae, OP clear CV: RRR, no MRG Resp: Non-labored, CTAB, no wheezes noted Abd: Soft, NTND, BS present, no guarding or organomegaly Ext: WWP, no edema MSK: resting tremor in b/l arms and head, worse with intention. Shuffling gait Neuro: Alert and oriented, speech normal       Chemistry      Component Value Date/Time   NA 144 01/05/2016 1553   NA 142 11/27/2012 1133   K 4.5 01/05/2016 1553   CL 107 01/05/2016 1553   CO2 25 01/05/2016 1553   BUN 9 01/05/2016 1553   BUN 13 11/27/2012 1133   CREATININE 1.05 01/05/2016 1553      Component Value Date/Time   CALCIUM 8.7 01/05/2016 1553   ALKPHOS 106 01/12/2014 0948   AST 13  01/12/2014 0948   ALT 13 01/12/2014 0948   BILITOT 0.6 01/12/2014 0948      Lab Results  Component Value Date   WBC 5.8 01/12/2014   HGB 14.3 01/12/2014   HCT 41.5 01/12/2014   MCV 98.3 01/12/2014   PLT 152 01/12/2014   Lab Results  Component Value Date   TSH 11.962 (H) 01/12/2014   Lab Results  Component Value Date   HGBA1C 5.4 06/16/2014   Assessment & Plan:     Brendan [name] is a 77 y.o. male here for   HYPERTENSION, BENIGN Well-controlled Blood pressure is low normal, the patient is asymptomatic Continue lisinopril 10 mg daily Check CMP today Follow-up in 3 months  Parkinsonism (HCC) Continue propranolol and primidone for tremor Patient does not want to follow-up with neurology at this time  Complex regional pain syndrome Doing well with pain well-controlled Continue gabapentin at current dose 600 mg twice a day and consider increasing 3 times a day if necessary in the future  Obesity (BMI 30-39.9) Counseled on diet and exercise Unfortunately he is limited immobility with his parkinsonism and neuropathy Check A1c  Hyperlipidemia LDL goal <70 Recheck lipid panel If LDL is not at goal, consider switching to high intensity statin such as atorvastatin   BID BM likely normal  variant    Erasmo DownerAngela M Bacigalupo, MD MPH PGY-3,  [facility] 05/01/2016  10:56 AM

## 2016-05-01 NOTE — Assessment & Plan Note (Signed)
Continue propranolol and primidone for tremor Patient does not want to follow-up with neurology at this time

## 2016-05-01 NOTE — Patient Instructions (Addendum)
Nice to see you again.  Continue current medications.  We are getting some labs today and someone will call you or send you a letter with the results when they're available.  I'm referring you to gastroenterology to get a colonoscopy.  Come back to see me in 3 months to follow-up on your chronic medical problems.  Take care, Dr. BLeonard Schwartz

## 2016-05-01 NOTE — Assessment & Plan Note (Signed)
Doing well with pain well-controlled Continue gabapentin at current dose 600 mg twice a day and consider increasing 3 times a day if necessary in the future

## 2016-05-01 NOTE — Assessment & Plan Note (Signed)
Counseled on diet and exercise Unfortunately he is limited immobility with his parkinsonism and neuropathy Check A1c

## 2016-05-01 NOTE — Assessment & Plan Note (Signed)
Well-controlled Blood pressure is low normal, the patient is asymptomatic Continue lisinopril 10 mg daily Check CMP today Follow-up in 3 months

## 2016-05-01 NOTE — Assessment & Plan Note (Signed)
Recheck lipid panel If LDL is not at goal, consider switching to high intensity statin such as atorvastatin

## 2016-05-02 ENCOUNTER — Telehealth: Payer: Self-pay | Admitting: Family Medicine

## 2016-05-02 MED ORDER — ATORVASTATIN CALCIUM 40 MG PO TABS: 40.0000 mg | ORAL_TABLET | Freq: Every day | ORAL | 3 refills | Status: DC

## 2016-05-02 NOTE — Telephone Encounter (Signed)
Called patient to discuss lab results from yesterday's visit.  Electrolytes, kidney and liver function wnl. No diabetes. Cholesterol remains with LDL above goal of <70.  Will switch from simvastatin to atorvastatin as discussed.  Talked to patient who agrees with plan.  If his wife has questions, she will call back.

## 2016-05-22 DIAGNOSIS — H40033 Anatomical narrow angle, bilateral: Secondary | ICD-10-CM | POA: Diagnosis not present

## 2016-05-22 DIAGNOSIS — H2513 Age-related nuclear cataract, bilateral: Secondary | ICD-10-CM | POA: Diagnosis not present

## 2016-06-15 DIAGNOSIS — H2511 Age-related nuclear cataract, right eye: Secondary | ICD-10-CM | POA: Diagnosis not present

## 2016-06-27 ENCOUNTER — Encounter: Payer: Self-pay | Admitting: Emergency Medicine

## 2016-06-27 ENCOUNTER — Ambulatory Visit (INDEPENDENT_AMBULATORY_CARE_PROVIDER_SITE_OTHER): Payer: PPO | Admitting: Emergency Medicine

## 2016-06-27 DIAGNOSIS — J920 Pleural plaque with presence of asbestos: Secondary | ICD-10-CM

## 2016-06-27 DIAGNOSIS — R05 Cough: Secondary | ICD-10-CM

## 2016-06-27 DIAGNOSIS — R059 Cough, unspecified: Secondary | ICD-10-CM | POA: Insufficient documentation

## 2016-06-27 MED ORDER — LORATADINE 10 MG PO TABS: 10.0000 mg | ORAL_TABLET | Freq: Every day | ORAL | 11 refills | Status: DC

## 2016-06-27 MED ORDER — VALSARTAN 160 MG PO TABS: 160.0000 mg | ORAL_TABLET | Freq: Every day | ORAL | 2 refills | Status: DC

## 2016-06-27 NOTE — Patient Instructions (Signed)
We will repeat a CT scan of your chest to compare with priors.  Please start taking omeprazole 20mg  every day until next visit.  We will start loratadine 10mg  once a day We will stop lisinopril for now Start valsartan 160mg  once a day until we follow up.  We will revisit whether to order a swallowing evaluation depending on how you progress.  Follow with Dr Delton CoombesByrum next available to review your CT scan and your symptoms.

## 2016-06-27 NOTE — Assessment & Plan Note (Signed)
Progression of his cough. Likely multifactorial. He does have some symptoms that could be consistent with aspiration. He does have more tremor, Parkinsons? He has under-treated GERD, untreated rhinitis. Will address both. Also change ACE-I to ARB.   Please start taking omeprazole 20mg  every day until next visit.  We will start loratadine 10mg  once a day We will stop lisinopril for now Start valsartan 160mg  once a day until we follow up.  We will revisit whether to order a swallowing evaluation depending on how you progress.  Follow with Dr Delton CoombesByrum next available to review your CT scan and your symptoms.

## 2016-06-27 NOTE — Progress Notes (Signed)
Subjective:    Patient ID: Brendan [name], male    DOB: 1940-07-05, 76 y.o.   MRN: [id number]  HPI  76 year old man, former heavy smoker, history of coronary disease and CABG, history of  Asbestos exposure with associated pleural plaquing. He's been followed in our office by Dr. Delford Field. His last CT scan of the chest performed on 08/31/14 that I personally reviewed. This shows stable bilateral pleural plaques dating back to 04/15/12. I do not see any evidence of interval change or evidence to support mesothelioma. He has been doing well, he does have some occasional cough, some throat clearing.  Has nasal congestion that comes and goes.  He is fairly sedentary, he has chronic fatigue. Occasionally he is stopped by his breathing.  He is to see Cardiology in April.   ROV 06/27/16 -- This follow-up visit for patient with a history of tobacco use, asbestos exposure with pleural plaques on CT scan of the chest. His last CT chest was June 2016 >> no evidence progression. He has been having persistent cough, more than prior baseline. He describes some dysphagia, some potential aspiration sx. His tremor has progressed some. Getting help eating and drinking. Denies any significant GERD, recently changed nexium to omeprazole prn. Has a lot of nasal congestion.                                                                                                                                                                                                  Review of Systems As per history of present illness Past Medical History:  Diagnosis Date  . Anxiety disorder   . Benign enlargement of prostate   . CAD in native artery 1997   Referred for CABG x3 in 2004 for LAD diagonal bifurcation lesion  . Cervical spinal stenosis   . Complex regional pain syndrome of right upper extremity    Right wrist; L arm  . Coronary atherosclerosis of artery bypass graft June 2013   Occluded SVG-D1; Cardiologist Dr. Herbie Baltimore  .  Gait disorder   . Gastroesophageal reflux disease   . Glaucoma   . Hiatal hernia    GI: Dr Evette Cristal  . Hyperlipidemia LDL goal <70   . Hypertension   . Memory loss    Mild  . Obesity   . Peripheral neuropathy (HCC)    possible peripheral neuropathy  . Reflex sympathetic dystrophy   . S/P CABG x 3 2004    LIMA-LAD, SVG to diagonal, SVG to OM  . Tremor, essential    On Primidone  . Unstable angina pectoris [facility])  June  2013   Cardiac cath: occluded SVG-DI. Patent LIMA-LAD and SVG-OM. EF 45% with apical inferior HK.     Family History  Problem Relation Age of Onset  . Heart disease Mother   . Heart disease Brother   . Tremor Paternal Uncle   . Heart disease Brother   . Drug abuse Daughter   . Hepatitis C Daughter      Social History   Social History  . Marital status: Married    Spouse name: N/A  . Number of children: N/A  . Years of education: N/A   Occupational History  . Not on file.   Social History Main Topics  . Smoking status: Former Smoker    Packs/day: 3.00    Years: 20.00    Types: Cigarettes    Quit date: 04/17/1968  . Smokeless tobacco: Never Used  . Alcohol use No     Comment: Occasional 2 mixed drinks or 3 beers.  . Drug use: No  . Sexual activity: No   Other Topics Concern  . Not on file   Social History Narrative   Married.  Wife is Royal PiedraMary Chenard.   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   Former smoker, quit 20 years ago. Does not drink alcohol.   Does not get routine exercise, walks sometimes with a cane.     No Known Allergies   Outpatient Medications Prior to Visit  Medication Sig Dispense Refill  . aspirin (LONGS ADULT LOW STRENGTH ASA) 81 MG EC tablet Take 81 mg by mouth daily.      [name]. atorvastatin (LIPITOR) 40 MG tablet Take 1 tablet (40 mg total) by mouth daily. 90 tablet 3  . fish oil-omega-3 fatty acids 1000 MG capsule Take 1 g by mouth 2 (two) times daily.    . furosemide (LASIX) 20 MG tablet Take 1 tablet (20 mg total)  by mouth daily as needed. 30 tablet 3  . gabapentin (NEURONTIN) 300 MG capsule Take 300mg  qAM and mid day and take 600mg  qhs. Can increase to 600mg  TID. 120 capsule 5  . lisinopril (PRINIVIL,ZESTRIL) 10 MG tablet Take 1 tablet (10 mg total) by mouth daily. 30 tablet 5  . nitroGLYCERIN (NITROSTAT) 0.4 MG SL tablet Place 1 tablet (0.4 mg total) under the tongue every 5 (five) minutes as needed for chest pain. call 911 if chest pain not better 30 tablet 3  . omeprazole (PRILOSEC) 20 MG capsule Take 1 capsule (20 mg total) by mouth daily as needed (acid reflux). 90 capsule 3  . primidone (MYSOLINE) 250 MG tablet Take 1 tablet (250 mg total) by mouth 2 (two) times daily. 60 tablet 5  . propranolol (INDERAL) 80 MG tablet Take 1 tablet (80 mg total) by mouth 2 (two) times daily. 60 tablet 5   Facility-Administered Medications Prior to Visit  Medication Dose Route Frequency Provider Last Rate Last Dose  . 0.9 %  sodium chloride infusion   Intravenous Continuous Marykay Lexavid W Harding, MD      . sodium chloride 0.9 % injection 3 mL  3 mL Intravenous PRN Marykay Lexavid W Harding, MD             Objective:   Physical Exam Vitals:   06/27/16 1115  BP: 116/78  Pulse: (!) 56  SpO2: 97%  Weight: 205 lb 9.6 oz (93.3 kg)  Height: 5\' 6"  (1.676 m)   Gen: Pleasant, well-nourished, in no distress,  normal affect  ENT: No lesions,  mouth clear,  oropharynx clear, no  postnasal drip  Neck: No JVD, no TMG, no carotid bruits  Lungs: No use of accessory muscles, no dullness to percussion, clear without rales or rhonchi  Cardiovascular: RRR, heart sounds normal, no murmur or gallops, no peripheral edema  Musculoskeletal: No deformities, no cyanosis or clubbing  Neuro: alert, non focal  Skin: Warm, no lesions or rashes     Assessment & Plan:  Pleural plaque with presence of asbestos We will repeat a CT scan of your chest to compare with priors.    Cough Progression of his cough. Likely multifactorial. He does  have some symptoms that could be consistent with aspiration. He does have more tremor, Parkinsons? He has under-treated GERD, untreated rhinitis. Will address both. Also change ACE-I to ARB.   Please start taking omeprazole 20mg  every day until next visit.  We will start loratadine 10mg  once a day We will stop lisinopril for now Start valsartan 160mg  once a day until we follow up.  We will revisit whether to order a swallowing evaluation depending on how you progress.  Follow with Dr Delton Coombes next available to review your CT scan and your symptoms.  Levy Pupa, MD, PhD 06/27/2016, 11:54 AM [facility] [phone] or if no answer [phone]

## 2016-06-27 NOTE — Assessment & Plan Note (Signed)
We will repeat a CT scan of your chest to compare with priors.

## 2016-07-14 ENCOUNTER — Ambulatory Visit (INDEPENDENT_AMBULATORY_CARE_PROVIDER_SITE_OTHER)
Admission: RE | Admit: 2016-07-14 | Discharge: 2016-07-14 | Disposition: A | Discharge status: Home / Self Care | Payer: PPO | Source: Ambulatory Visit | Attending: Emergency Medicine | Admitting: Emergency Medicine

## 2016-07-14 DIAGNOSIS — J920 Pleural plaque with presence of asbestos: Secondary | ICD-10-CM

## 2016-07-14 DIAGNOSIS — R05 Cough: Secondary | ICD-10-CM | POA: Diagnosis not present

## 2016-07-19 ENCOUNTER — Encounter: Payer: Self-pay | Admitting: Adult Health

## 2016-07-19 ENCOUNTER — Telehealth: Payer: Self-pay | Admitting: Adult Health

## 2016-07-19 ENCOUNTER — Ambulatory Visit (INDEPENDENT_AMBULATORY_CARE_PROVIDER_SITE_OTHER): Payer: PPO | Admitting: Adult Health

## 2016-07-19 DIAGNOSIS — R059 Cough, unspecified: Secondary | ICD-10-CM

## 2016-07-19 DIAGNOSIS — I10 Essential (primary) hypertension: Secondary | ICD-10-CM

## 2016-07-19 DIAGNOSIS — R05 Cough: Secondary | ICD-10-CM

## 2016-07-19 DIAGNOSIS — J920 Pleural plaque with presence of asbestos: Secondary | ICD-10-CM

## 2016-07-19 NOTE — Patient Instructions (Addendum)
Follow up with Primary MD for further blood pressure control .  Continue on Claritin  daily As needed  Drainage .  Follow up with Dr. Delton Coombes in 1 year and As needed

## 2016-07-19 NOTE — Progress Notes (Signed)
  ID: Brendan [name], male    DOB: 05/11/1940, 76 y.o.   MRN: [id number]  Chief Complaint  Patient presents with  . Follow-up    Cough /CT chest     Referring provider: Erasmo Downer, MD  HPI: 76 year old male former heavy smoker followed for asbestos exposure with pleural plaques on CT scan of his chest. History of coronary artery disease and CABG  TEST  CT chest 07/14/16 >howed stable areas of pleural plaques. Mild scarring and atelectasis in the L  base without significant change.   07/26/2016 Follow up : Asbestos exposure /Cough  Patient returns for a two-week follow-up. Patient was seen last visit for a flare of his cough. He was recommended to start Prilosec daily along with Claritin. He was stopped off his lisinopril and change to Diovan. Turns today feeling better. Cough is much better. He denies any hemoptysis, orthopnea, PND, or increased leg swelling. Had a CT chest on 07/14/2016 that showed stable areas of pleural plaques. Mild scarring and atelectasis in the left base without significant change.   No Known Allergies  Immunization History  Administered Date(s) Administered  . Influenza Split 01/11/2011, 01/02/2012  . Influenza Whole 02/07/2008, 01/29/2009, 01/18/2010  . Influenza,inj,Quad PF,36+ Mos 01/31/2013, 01/12/2014, 01/06/2015, 01/05/2016  . Pneumococcal Conjugate-13 11/30/2015  . Pneumococcal Polysaccharide-23 11/15/1996, 02/27/2011  . Td 05/18/2002  . Zoster 08/10/2011    Past Medical History:  Diagnosis Date  . Anxiety disorder   . Benign enlargement of prostate   . CAD in native artery 1997   Referred for CABG x3 in 2004 for LAD diagonal bifurcation lesion  . Cervical spinal stenosis   . Complex regional pain syndrome of right upper extremity    Right wrist; L arm  . Coronary atherosclerosis of artery bypass graft June 2013   Occluded SVG-D1; Cardiologist Dr. Herbie Baltimore  . Gait disorder   . Gastroesophageal reflux disease   .  Glaucoma   . Hiatal hernia    GI: Dr Evette Cristal  . Hyperlipidemia LDL goal <70   . Hypertension   . Memory loss    Mild  . Obesity   . Peripheral neuropathy (HCC)    possible peripheral neuropathy  . Reflex sympathetic dystrophy   . S/P CABG x 3 2004    LIMA-LAD, SVG to diagonal, SVG to OM  . Tremor, essential    On Primidone  . Unstable angina pectoris [facility])  June 2013   Cardiac cath: occluded SVG-DI. Patent LIMA-LAD and SVG-OM. EF 45% with apical inferior HK.    Tobacco History: History  Smoking Status  . Former Smoker  . Packs/day: 3.00  . Years: 20.00  . Types: Cigarettes  . Quit date: 04/17/1968  Smokeless Tobacco  . Never Used   Counseling given: Not Answered   Outpatient Encounter Prescriptions as of 07/19/2016  Medication Sig  . aspirin (LONGS ADULT LOW STRENGTH ASA) 81 MG EC tablet Take 81 mg by mouth daily.    [name] atorvastatin (LIPITOR) 40 MG tablet Take 1 tablet (40 mg total) by mouth daily.  . fish oil-omega-3 fatty acids 1000 MG capsule Take 1 g by mouth 2 (two) times daily.  . furosemide (LASIX) 20 MG tablet Take 1 tablet (20 mg total) by mouth daily as needed.  . loratadine (CLARITIN) 10 MG tablet Take 1 tablet (10 mg total) by mouth daily.  . nitroGLYCERIN (NITROSTAT) 0.4 MG SL tablet Place 1 tablet (0.4 mg total) under the tongue every 5 (five) minutes as needed  for chest pain. call 911 if chest pain not better  . omeprazole (PRILOSEC) 20 MG capsule Take 1 capsule (20 mg total) by mouth daily as needed (acid reflux).  . primidone (MYSOLINE) 250 MG tablet Take 1 tablet (250 mg total) by mouth 2 (two) times daily.  . propranolol (INDERAL) 80 MG tablet Take 1 tablet (80 mg total) by mouth 2 (two) times daily.  . valsartan (DIOVAN) 160 MG tablet Take 1 tablet (160 mg total) by mouth daily.  . [DISCONTINUED] gabapentin (NEURONTIN) 300 MG capsule Take  qAM and mid day and take  qhs. Can increase to  TID.  . [DISCONTINUED] lisinopril (PRINIVIL,ZESTRIL) 10  MG tablet Take 1 tablet (10 mg total) by mouth daily. (Patient not taking: Reported on 07/19/2016)   Facility-Administered Encounter Medications as of 07/19/2016  Medication  . 0.9 %  sodium chloride infusion  . sodium chloride 0.9 % injection 3 mL     Review of Systems  Constitutional:   No  weight loss, night sweats,  Fevers, chills, fatigue, or  lassitude.  HEENT:   No headaches,  Difficulty swallowing,  Tooth/dental problems, or  Sore throat,                No sneezing, itching, ear ache, nasal congestion, post nasal drip,   CV:  No chest pain,  Orthopnea, PND, swelling in lower extremities, anasarca, dizziness, palpitations, syncope.   GI  No heartburn, indigestion, abdominal pain, nausea, vomiting, diarrhea, change in bowel habits, loss of appetite, bloody stools.   Resp:    No chest wall deformity  Skin: no rash or lesions.  GU: no dysuria, change in color of urine, no urgency or frequency.  No flank pain, no hematuria   MS:  No joint pain or swelling.  No decreased range of motion.  No back pain.    Physical Exam  BP 130/72 (BP Location: Left Arm, Patient Position: Sitting, Cuff Size: Large)   Pulse (!) 56   Ht  (1.676 m)   Wt 207 lb (93.9 kg)   SpO2 96%   BMI 33.41 kg/m   GEN: A/Ox3; pleasant , NAD, elderly    HEENT:  [place]/AT,  EACs-clear, TMs-wnl, NOSE-clear, THROAT-clear, no lesions, no postnasal drip or exudate noted.   NECK:  Supple w/ fair ROM; no JVD; normal carotid impulses w/o bruits; no thyromegaly or nodules palpated; no lymphadenopathy.    RESP  Clear  P & A; w/o, wheezes/ rales/ or rhonchi. no accessory muscle use, no dullness to percussion  CARD:  RRR, no m/r/g, no peripheral edema, pulses intact, no cyanosis or clubbing.  GI:   Soft & nt; nml bowel sounds; no organomegaly or masses detected.   Musco: Warm bil, no deformities or joint swelling noted.   Neuro: alert, no focal deficits noted.    Skin: Warm, no lesions or rashes   Lab  Results:  CBC    Component Value Date/Time   WBC 5.8 01/12/2014 0948   RBC 4.22 01/12/2014 0948   HGB 14.3 01/12/2014 0948   HCT 41.5 01/12/2014 0948   PLT 152 01/12/2014 0948   MCV 98.3 01/12/2014 0948   MCH 33.9 01/12/2014 0948   MCHC 34.5 01/12/2014 0948   RDW 14.6 01/12/2014 0948   RDW 14.2 11/27/2012 1133   LYMPHSABS 1.5 01/12/2014 0948   LYMPHSABS 1.6 11/27/2012 1133   MONOABS 0.5 01/12/2014 0948   EOSABS 0.2 01/12/2014 0948   EOSABS 0.1 11/27/2012 1133   BASOSABS 0.0 01/12/2014  0948   BASOSABS 0.0 11/27/2012 1133    BMET    Component Value Date/Time   NA 141 05/01/2016 1118   NA 142 11/27/2012 1133   K 5.0 05/01/2016 1118   CL 103 05/01/2016 1118   CO2 31 05/01/2016 1118   GLUCOSE 133 (H) 05/01/2016 1118   BUN 15 05/01/2016 1118   BUN 13 11/27/2012 1133   CREATININE 1.02 05/01/2016 1118   CALCIUM 9.1 05/01/2016 1118   GFRNONAA 73 05/01/2016 1118   GFRAA 84 05/01/2016 1118    BNP No results found for: BNP  ProBNP    Component Value Date/Time   PROBNP 252.8 (H) 06/26/2012 1628    Imaging: Ct Chest Wo Contrast  Result Date: 07/14/2016 CLINICAL DATA:  Previous asbestos exposure.  Cough. EXAM: CT CHEST WITHOUT CONTRAST TECHNIQUE: Multidetector CT imaging of the chest was performed following the standard protocol without IV contrast. COMPARISON:  CT chest Aug 31, 2014 and July 16, 2013 FINDINGS: Cardiovascular: There is no thoracic aortic aneurysm. The patient is status post coronary artery bypass grafting. There is extensive native coronary artery calcification. Visualized great vessels appear unremarkable. There is mild calcification at sites in the thoracic aorta, not progressed. Pericardium is not thickened. Mediastinum/Nodes: Thyroid appears unremarkable. There are scattered subcentimeter lymph nodes without adenopathy by size criteria. Lungs/Pleura: There are scattered areas of pleural plaque with parietal pleural calcification along the anterior right  hemithorax, stable. A small amount of pleural thickening is noted in the anterior left hemithorax, stable. Localized pleural plaque formation with pleural calcification noted laterally on the left, stable. There is mild pleural plaque along the posterior right hemithorax, stable. There is moderate lobulated plaque along the posterior and posterolateral left hemithorax with associated left base atelectasis, stable. There has been no progression of pleural thickening or plaque development on either side compared to prior studies as noted above. There is no edema or consolidation. No evident pleural effusion. Upper Abdomen: There is cholelithiasis. There are scattered foci of arterial vascular calcification. Musculoskeletal: There is degenerative change in the thoracic spine. No blastic or lytic bone lesions. Patient is status post median sternotomy. IMPRESSION: Areas of pleural plaque formation, most notably along the posterior and posterolateral left hemithorax, remain stable as does degree of parietal pleural calcification. Mild scarring and atelectasis in the left base persists without change. No new parenchymal lung or pleural opacity. No adenopathy. Patient is status post coronary artery bypass grafting. Cholelithiasis. Electronically Signed   By: Bretta Bang III M.D.   On: 07/14/2016 11:46     Assessment & Plan:   Pleural plaque with presence of asbestos CT chest showed stable pleural plaques  Cont to follow .   Cough Recent cough flare -resolved with AR /GERD prevention and ACE inhibitor discontinuation .   Plan  Patient Instructions  Follow up with Primary MD for further blood pressure control .  Continue on Claritin  daily As needed  Drainage .  Follow up with Dr. Delton Coombes in 1 year and As needed      HYPERTENSION, BENIGN Would avoid ACE inhibitor if possible  follow up with PCP for further b/p control      Rubye Oaks, NP

## 2016-07-19 NOTE — Telephone Encounter (Signed)
Spoke with pt's wife mary (dpr on file) states she is returning a call from our office- I advised that I do not see where anyone in the office had called the pt.  Will close encounter.

## 2016-07-25 ENCOUNTER — Encounter: Payer: Self-pay | Admitting: Family Medicine

## 2016-07-25 ENCOUNTER — Ambulatory Visit (INDEPENDENT_AMBULATORY_CARE_PROVIDER_SITE_OTHER): Payer: PPO | Admitting: Family Medicine

## 2016-07-25 VITALS — BP 154/70 | HR 64 | Temp 98.9°F | Wt 208.0 lb

## 2016-07-25 DIAGNOSIS — E785 Hyperlipidemia, unspecified: Secondary | ICD-10-CM

## 2016-07-25 DIAGNOSIS — G20 Parkinson's disease: Secondary | ICD-10-CM

## 2016-07-25 DIAGNOSIS — G9050 Complex regional pain syndrome I, unspecified: Secondary | ICD-10-CM | POA: Diagnosis not present

## 2016-07-25 DIAGNOSIS — I10 Essential (primary) hypertension: Secondary | ICD-10-CM | POA: Diagnosis not present

## 2016-07-25 MED ORDER — GABAPENTIN 300 MG PO CAPS: 600.0000 mg | ORAL_CAPSULE | Freq: Three times a day (TID) | ORAL | 5 refills | Status: DC

## 2016-07-25 NOTE — Progress Notes (Signed)
Subjective:   Brendan [name] is a 76 y.o. male with a history of HTN, CAD, GERD, complex regional pain syndrome, HLD, obesity, essential tremor here for follow-up of pain, HTN  Complex regional pain syndrome/tremor: . - discontinued tramadol in 2017 - gabapentin  BID - pain continues in bilateral feet - no excessive drowsiness - tremor is getting worse - doesn't think that neurology had anything else to offer - this is why he has not been seen since 2014 - has done PT previously and does not wish to go again - having trouble getting tired when walking long distances - cannot walk around grocery store anymore - wife reports he shuffles and hunches forward when walking  HTN: - Medications: valsartan  daily (Pulm switched from lisinopril  daily due to cough last month) - Compliance: good - Checking BP at home: no - Denies any SOB, CP (except intermittent chronic stable angina), vision changes, LE edema, medication SEs, or symptoms of hypotension - Diet: eats a lot of junk food - Exercise: none  HLD - medications: fish oil, atorvastatin  daily (switched from simvastatin as LDL >70 in 04/2016) - compliance: good - medication SEs: none  Review of Systems:  Per HPI.   Social History: former smoker  Objective:  BP (!) 154/70 (BP Location: Right Arm, Patient Position: Sitting, Cuff Size: Normal)   Pulse 64   Temp 98.9 F (37.2 C) (Oral)   Wt 208 lb (94.3 kg)   SpO2 94%   BMI 33.57 kg/m   Gen:  76 y.o. male in NAD HEENT: NCAT, MMM, EOMI, PERRL, anicteric sclerae, OP clear CV: RRR, no MRG Resp: Non-labored, CTAB, no wheezes noted Abd: Soft, NTND, BS present, no guarding or organomegaly Ext: WWP, no edema MSK: resting tremor in b/l arms and head. Shuffling gait. Slow to initiate movements Neuro: Alert and oriented, speech normal       Chemistry      Component Value Date/Time   NA 141 05/01/2016 1118   NA 142 11/27/2012 1133   K 5.0 05/01/2016 1118   CL 103 05/01/2016 1118   CO2 31 05/01/2016 1118   BUN 15 05/01/2016 1118   BUN 13 11/27/2012 1133   CREATININE 1.02 05/01/2016 1118      Component Value Date/Time   CALCIUM 9.1 05/01/2016 1118   ALKPHOS 112 05/01/2016 1118   AST 14 05/01/2016 1118   ALT 10 05/01/2016 1118   BILITOT 0.5 05/01/2016 1118      Lab Results  Component Value Date   WBC 5.8 01/12/2014   HGB 14.3 01/12/2014   HCT 41.5 01/12/2014   MCV 98.3 01/12/2014   PLT 152 01/12/2014   Lab Results  Component Value Date   TSH 11.962 (H) 01/12/2014   Lab Results  Component Value Date   HGBA1C 5.6 05/01/2016   Assessment & Plan:     Brendan [name] is a 76 y.o. male here for   HYPERTENSION, BENIGN Systolic blood pressure is above goal at 150 on manual recheck Diastolic however is low at 60 We will have to carefully titrate antihypertensives so as to not make him hypotensive and more likely to fall as he is artery at risk for falls Continue valsartan 160 mg daily Avoid ACE inhibitor's given cough that is now better off ACE inhibitor  Parkinsonism (HCC) Continue propranolol and primidone for tremor Patient is now agreeable to following up with neurology for possible parkinsonian features Advised on physical therapy, but patient refuses at  this time  Complex regional pain syndrome Pain is fairly well controlled, but not completely Increase gabapentin to 600 mg 3 times a day Follow-up in one month  Hyperlipidemia LDL goal <70 Doing well on atorvastatin 40 mg   Erasmo Downer, MD MPH PGY-3,  [facility] 07/26/2016  3:01 PM

## 2016-07-25 NOTE — Patient Instructions (Addendum)
Nice to see you again today.  Stop lisinopril.  Continue valsartan for blood pressure.  We will have you see neurology again.  Increase gabapentin to 3 times daily.  We'll follow-up in 1 month for your blood pressure.  [email or web address]   Take care, Dr. Leonard Schwartz

## 2016-07-26 NOTE — Assessment & Plan Note (Signed)
Doing well on atorvastatin 40mg

## 2016-07-26 NOTE — Assessment & Plan Note (Signed)
Continue propranolol and primidone for tremor Patient is now agreeable to following up with neurology for possible parkinsonian features Advised on physical therapy, but patient refuses at this time

## 2016-07-26 NOTE — Assessment & Plan Note (Signed)
Recent cough flare -resolved with AR /GERD prevention and ACE inhibitor discontinuation .   Plan  Patient Instructions  Follow up with Primary MD for further blood pressure control .  Continue on Claritin  daily As needed  Drainage .  Follow up with Dr. Delton Coombes in 1 year and As needed

## 2016-07-26 NOTE — Assessment & Plan Note (Signed)
Would avoid ACE inhibitor if possible  follow up with PCP for further b/p control

## 2016-07-26 NOTE — Assessment & Plan Note (Signed)
Pain is fairly well controlled, but not completely Increase gabapentin to 600 mg 3 times a day Follow-up in one month

## 2016-07-26 NOTE — Assessment & Plan Note (Signed)
CT chest showed stable pleural plaques  Cont to follow .

## 2016-07-26 NOTE — Assessment & Plan Note (Signed)
Systolic blood pressure is above goal at 150 on manual recheck Diastolic however is low at 60 We will have to carefully titrate antihypertensives so as to not make him hypotensive and more likely to fall as he is artery at risk for falls Continue valsartan 160 mg daily Avoid ACE inhibitor's given cough that is now better off ACE inhibitor

## 2016-08-07 DIAGNOSIS — H2512 Age-related nuclear cataract, left eye: Secondary | ICD-10-CM | POA: Diagnosis not present

## 2016-08-10 DIAGNOSIS — H2512 Age-related nuclear cataract, left eye: Secondary | ICD-10-CM | POA: Diagnosis not present

## 2016-08-24 ENCOUNTER — Encounter: Payer: Self-pay | Admitting: Family Medicine

## 2016-08-24 ENCOUNTER — Ambulatory Visit (INDEPENDENT_AMBULATORY_CARE_PROVIDER_SITE_OTHER): Payer: PPO | Admitting: Family Medicine

## 2016-08-24 VITALS — BP 146/70 | HR 54 | Temp 98.5°F | Ht 66.0 in | Wt 210.0 lb

## 2016-08-24 DIAGNOSIS — I10 Essential (primary) hypertension: Secondary | ICD-10-CM | POA: Diagnosis not present

## 2016-08-24 DIAGNOSIS — G9050 Complex regional pain syndrome I, unspecified: Secondary | ICD-10-CM | POA: Diagnosis not present

## 2016-08-24 MED ORDER — VALSARTAN 320 MG PO TABS: 320.0000 mg | ORAL_TABLET | Freq: Every day | ORAL | 3 refills | Status: DC

## 2016-08-24 NOTE — Assessment & Plan Note (Signed)
Pain is more well controlled on increase gabapentin dose Continue gabapentin 600 mg 3 times a day

## 2016-08-24 NOTE — Progress Notes (Signed)
   Subjective:   Brendan [name] is a 76 y.o. male with a history of HTN, CAD status post CABG, HLD, complex regional pain syndrome, parkinsonism here for follow-up of medications  Complex Regional Pain Syndrome - gabapentin was increased to 600mg  TID in 07/2016 - half of the time forgets to take lunch time dose - no excessive drowsiness - cant tell that it helped pain much - wife reports he complains less about it  HTN - Blood pressure seems to be running higher since switching from lisinopril 10 mg to valsartan 160 mg daily - Cough has improved since switching from lisinopril to valsartan - No symptoms of hypotension, chest pain, shortness breath, lower extremity edema, vision changes  Review of Systems:  Per HPI.   Social History: former smoker  Objective:  BP (!) 146/70   Pulse (!) 54   Temp 98.5 F (36.9 C) (Oral)   Ht 5\' 6"  (1.676 m)   Wt 210 lb (95.3 kg)   SpO2 93%   BMI 33.89 kg/m   Gen:  76 y.o. male in NAD HEENT: NCAT, MMM, anicteric sclerae CV: RRR, no MRG Resp: Non-labored, CTAB, no wheezes noted Ext: WWP, no edema MSK: Gait slow and shuffling, no cogwheel rigidity Neuro: Alert and oriented, speech normal, resting pill rolling tremor present but significantly less than baseline       Chemistry      Component Value Date/Time   NA 141 05/01/2016 1118   NA 142 11/27/2012 1133   K 5.0 05/01/2016 1118   CL 103 05/01/2016 1118   CO2 31 05/01/2016 1118   BUN 15 05/01/2016 1118   BUN 13 11/27/2012 1133   CREATININE 1.02 05/01/2016 1118      Component Value Date/Time   CALCIUM 9.1 05/01/2016 1118   ALKPHOS 112 05/01/2016 1118   AST 14 05/01/2016 1118   ALT 10 05/01/2016 1118   BILITOT 0.5 05/01/2016 1118      Lab Results  Component Value Date   WBC 5.8 01/12/2014   HGB 14.3 01/12/2014   HCT 41.5 01/12/2014   MCV 98.3 01/12/2014   PLT 152 01/12/2014   Lab Results  Component Value Date   TSH 11.962 (H) 01/12/2014   Lab Results  Component Value  Date   HGBA1C 5.6 05/01/2016   Assessment & Plan:     Brendan [name] is a 76 y.o. male here for   HYPERTENSION, BENIGN Uncontrolled Increase Valsartan to 320 mg daily Avoid ACE inhibitors given that cough improved off ACE inhibitor f/u in 1 month Advised on symptoms of hypotension and what to do if this occurs  Complex regional pain syndrome Pain is more well controlled on increase gabapentin dose Continue gabapentin 600 mg 3 times a day   Erasmo DownerBacigalupo, Jenalee Trevizo M, MD MPH PGY-3,  [facility] 08/24/2016  1:27 PM

## 2016-08-24 NOTE — Patient Instructions (Signed)
Nice to see you again today. Increase your valsartan to 320 mg daily. You can take 2 of the 160 mg pills until you run out. Then there is a prescription for the 320 mg pills at the pharmacy.  I'll see you back in about a month to follow-up on the blood pressure. Please let me know if you are having side effects in the meantime.  Take care, Dr. BLeonard Schwartz

## 2016-08-24 NOTE — Assessment & Plan Note (Signed)
Uncontrolled Increase Valsartan to 320 mg daily Avoid ACE inhibitors given that cough improved off ACE inhibitor f/u in 1 month Advised on symptoms of hypotension and what to do if this occurs

## 2016-09-07 ENCOUNTER — Telehealth: Payer: Self-pay | Admitting: Neurology

## 2016-09-07 ENCOUNTER — Ambulatory Visit: Payer: Self-pay | Admitting: Neurology

## 2016-09-07 NOTE — Telephone Encounter (Signed)
This patient canceled same day of NP appointment.

## 2016-09-25 ENCOUNTER — Ambulatory Visit: Payer: Self-pay | Admitting: Family Medicine

## 2016-09-28 ENCOUNTER — Ambulatory Visit: Payer: PPO | Admitting: Cardiology

## 2016-09-28 ENCOUNTER — Encounter: Payer: Self-pay | Admitting: Cardiology

## 2016-09-28 ENCOUNTER — Ambulatory Visit (INDEPENDENT_AMBULATORY_CARE_PROVIDER_SITE_OTHER): Payer: PPO | Admitting: Cardiology

## 2016-09-28 VITALS — BP 150/84 | HR 60 | Ht 66.0 in | Wt 212.0 lb

## 2016-09-28 DIAGNOSIS — I25119 Atherosclerotic heart disease of native coronary artery with unspecified angina pectoris: Secondary | ICD-10-CM

## 2016-09-28 DIAGNOSIS — R600 Localized edema: Secondary | ICD-10-CM | POA: Diagnosis not present

## 2016-09-28 DIAGNOSIS — E785 Hyperlipidemia, unspecified: Secondary | ICD-10-CM | POA: Diagnosis not present

## 2016-09-28 DIAGNOSIS — I10 Essential (primary) hypertension: Secondary | ICD-10-CM

## 2016-09-28 MED ORDER — FUROSEMIDE 20 MG PO TABS: 20.0000 mg | ORAL_TABLET | Freq: Every day | ORAL | 11 refills | Status: DC

## 2016-09-28 NOTE — Patient Instructions (Signed)
Use lasix 20 mg  Daily -- may take an extra dose if needed.   Your physician wants you to follow-up in 12 months with Dr Herbie BaltimoreHarding.You will receive a reminder letter in the mail two months in advance. If you don't receive a letter, please call our office to schedule the follow-up appointment.

## 2016-09-28 NOTE — Progress Notes (Addendum)
PCP: Erasmo Downer, MD  Clinic Note: Chief Complaint  Patient presents with  . Follow-up    chest pain- had episod ethe other day and took Nitro and pain went away, shortness of breath-only with exertion,  no edenma, occassional pain or in legs and feet tingle, no lightheaded or dizziness    HPI: Brendan [name] is a 76 y.o. male who is being seen today for Delayed annual follow-up for CAD-CABG. He has a h/o CAD with CABGx3 in 1997.  Unstable Angina in June 2013 - Occluded SVG-Diag with Patent LIMA-LAD & SVG-OM treated medically.  He does have chronic stable angina. He is on Inderal as BB to help Rx tremors along with CAD.   Most recent surveillance Myoview by June 2017.  Brendan [name] was last seen in April 2017  Recent Hospitalizations: none  Studies Personally Reviewed - (if available, images/films reviewed: From Epic Chart or Care Everywhere)  Myoview 09/2015: EF 55-65%. No ischemia or infarction noted. LOW RISK.  Interval History: Brendan [name] presents today overall doing fairly well. He had one episode where he took nitroglycerin for chest pain last week. Otherwise he has not used nitroglycerin in the entire year. He really isn't all that active and probably does anything during the day besides sitting his chair because of his parkinsonism. He has been notices a worsening edema of late and has had to take extra dose of Lasix. He also notes his blood pressures been going up higher. He doesn't think he is changes diet at all. He also doesn't have any PND or orthopnea. He basically just sits around all day long. He denies any other episodes of chest tightness or pressure with whatever activity he does. Nothing at rest. No resting or exertional dyspnea. No PND or orthopnea. No palpitations, lightheadedness, dizziness, weakness or syncope/near syncope. No TIA/amaurosis fugax symptoms. No melena, hematochezia, hematuria, or epstaxis. No claudication - he does not walk enough for  that..  ROS: A comprehensive was performed. Review of Systems  Constitutional: Positive for malaise/fatigue (No energy related to his Parkinson's).  HENT: Negative for congestion and nosebleeds.   Gastrointestinal: Negative for blood in stool and melena.  Genitourinary: Negative for hematuria.  Musculoskeletal: Positive for joint pain. Negative for falls.  Neurological: Positive for tremors (Getting worse) and weakness. Negative for dizziness (Just unsteady gait).  Endo/Heme/Allergies: Negative for environmental allergies.  Psychiatric/Behavioral: Positive for memory loss.  All other systems reviewed and are negative.  I have reviewed and (if needed) personally updated the patient's problem list, medications, allergies, past medical and surgical history, social and family history.   Past Medical History:  Diagnosis Date  . Anxiety disorder   . Benign enlargement of prostate   . CAD in native artery 1997   Referred for CABG x3 in 2004 for LAD diagonal bifurcation lesion  . Cervical spinal stenosis   . Complex regional pain syndrome of right upper extremity    Right wrist; L arm  . Coronary atherosclerosis of artery bypass graft June 2013   Occluded SVG-D1; Cardiologist Dr. Herbie Baltimore  . Gait disorder   . Gastroesophageal reflux disease   . Glaucoma   . Hiatal hernia    GI: Dr Evette Cristal  . Hyperlipidemia LDL goal <70   . Hypertension   . Memory loss    Mild  . Obesity   . Peripheral neuropathy    possible peripheral neuropathy  . Reflex sympathetic dystrophy   . S/P CABG x 3 2004  LIMA-LAD, SVG to diagonal, SVG to OM  . Tremor, essential    On Primidone  . Unstable angina pectoris [facility])  June 2013   Cardiac cath: occluded SVG-DI. Patent LIMA-LAD and SVG-OM. EF 45% with apical inferior HK.    Past Surgical History:  Procedure Laterality Date  . CORONARY ARTERY BYPASS GRAFT  06/2002   LIMA-LAD, SVG-D1, SVG-OM  . KNEE SURGERY Left   . LEFT HEART CATHETERIZATION WITH  CORONARY/GRAFT ANGIOGRAM N/A 10/13/2011   Procedure: LEFT HEART CATHETERIZATION WITH Isabel Caprice;  Surgeon: Marykay Lex, MD;  Location: [facility] CATH LAB;  Service: Cardiovascular;  Laterality: N/A;  . NM MYOCAR PERF WALL MOTION  03/23/2009   protocol:Bruce, normal perfusion in all regions, post-stress EF 72%, exercise capacity , EKG negative for ischemia.  . Shoulder Orthoscopic Surgery   06/2003  . TRANSTHORACIC ECHOCARDIOGRAM  April 2014   EF 55-60%; no regional wall motion abnormalities. Relatively normal study normal diastolic parameters/abnormal relaxation  . wrist ganglion cyst Left     Current Meds  Medication Sig  . aspirin (LONGS ADULT LOW STRENGTH ASA) 81 MG EC tablet Take 81 mg by mouth daily.    [name] atorvastatin (LIPITOR) 40 MG tablet Take 1 tablet (40 mg total) by mouth daily.  . fish oil-omega-3 fatty acids 1000 MG capsule Take 1 g by mouth 2 (two) times daily.  . furosemide (LASIX) 20 MG tablet Take 1 tablet (20 mg total) by mouth daily. May take an  Extra dose if needed  . gabapentin (NEURONTIN) 300 MG capsule Take 2 capsules (600 mg total) by mouth 3 (three) times daily.  [name] loratadine (CLARITIN) 10 MG tablet Take 1 tablet (10 mg total) by mouth daily.  . nitroGLYCERIN (NITROSTAT) 0.4 MG SL tablet Place 1 tablet (0.4 mg total) under the tongue every 5 (five) minutes as needed for chest pain. call 911 if chest pain not better  . omeprazole (PRILOSEC) 20 MG capsule Take 1 capsule (20 mg total) by mouth daily as needed (acid reflux).  . primidone (MYSOLINE) 250 MG tablet Take 1 tablet (250 mg total) by mouth 2 (two) times daily.  . propranolol (INDERAL) 80 MG tablet Take 1 tablet (80 mg total) by mouth 2 (two) times daily.  . valsartan (DIOVAN) 320 MG tablet Take 1 tablet (320 mg total) by mouth daily.  . [DISCONTINUED] furosemide (LASIX) 20 MG tablet Take 1 tablet (20 mg total) by mouth daily as needed.    Allergies  Allergen Reactions  . Lisinopril Cough     Social History   Social History  . Marital status: Married    Spouse name: N/A  . Number of children: N/A  . Years of education: N/A   Social History Main Topics  . Smoking status: Former Smoker    Packs/day: 3.00    Years: 20.00    Types: Cigarettes    Quit date: 04/17/1968  . Smokeless tobacco: Never Used  . Alcohol use No     Comment: Occasional 2 mixed drinks or 3 beers.  . Drug use: No  . Sexual activity: No   Other Topics Concern  . None   Social History Narrative   Married.  Wife is [place].   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   Former smoker, quit 20 years ago. Does not drink alcohol.   Does not get routine exercise, walks sometimes with a cane.    family history includes Drug abuse in his daughter; Heart disease in his brother, brother, and  mother; Hepatitis C in his daughter; Tremor in his paternal uncle.  Wt Readings from Last 3 Encounters:  09/28/16 212 lb (96.2 kg)  08/24/16 210 lb (95.3 kg)  07/25/16 208 lb (94.3 kg)    PHYSICAL EXAM BP (!) 150/84   Pulse 60   Ht 5\' 6"  (1.676 m)   Wt 212 lb (96.2 kg)   BMI 34.22 kg/m  General appearance: alert, cooperative, appears stated age, no distress. Mildly obese HEENT: [place]/AT, EOMI, MMM, anicteric sclera Neck: no adenopathy, no carotid bruit and no JVD Lungs: clear to auscultation bilaterally, normal percussion bilaterally and non-labored Heart: regular rate and rhythm, S1 &S2 normal, no murmur, click, rub or gallop; nondisplaced PMI Abdomen: soft, non-tender; bowel sounds normal; no masses,  no organomegaly; no HJR Extremities: extremities normal, atraumatic, no cyanosis, and 1-2+ bilateral edema  Pulses: 2+ and symmetric;  Skin:  Overall normal texture and temperature. Mild bilateral venous stasis changes in lower 70s.  Neurologic: Mental status: Alert & oriented x 3, thought content appropriate; non-focal exam.  Pleasant mood & affect.bilateral arms with pill-rolling tremor.      Adult ECG Report  [id number] ;  Rhythm: normal sinus rhythm and LAFB, nonspecific ST and T-wave changes. Otherwise normal EKG.;   Narrative Intstable EKG   Other studies Reviewed: Additional studies/ records that were reviewed today include:  Recent Labs:   Lab Results  Component Value Date   CHOL 148 05/01/2016   HDL 42 05/01/2016   LDLCALC 88 05/01/2016   LDLDIRECT 84 08/30/2006   TRIG 89 05/01/2016   CHOLHDL 3.5 05/01/2016   Lab Results  Component Value Date   CREATININE 1.02 05/01/2016   BUN 15 05/01/2016   NA 141 05/01/2016   K 5.0 05/01/2016   CL 103 05/01/2016   CO2 31 05/01/2016     ASSESSMENT / PLAN: Problem List Items Addressed This Visit    Atherosclerotic heart disease of native coronary artery with angina pectoris (HCC) - Primary (Chronic)    Chronic stable angina, well controlled on current dose of propranolol and Diovan. Only one dose of nitroglycerin in one year is pretty acceptable. Nonischemic Myoview last year. At this rate I probably would check a follow-up Myoview every 3-4 years simply because he doesn't do enough activity to notice any exertional symptoms.  Part of this is because he is really not really mobile from Parkinson's.      Relevant Medications   furosemide (LASIX) 20 MG tablet   Other Relevant Orders   EKG 12-Lead (Completed)   Hyperlipidemia LDL goal <70 (Chronic)    Not is well-controlled as I would like to be on current dose of statin. Goal LDL should be less than 70. We'll continue to monitor, but if not at goal by next year would recommend potentially adding Zetia or switching to Crestor. Next consideration would be for PCSK9 inhibitor.      Relevant Medications   furosemide (LASIX) 20 MG tablet   HYPERTENSION, BENIGN (Chronic)    Blood pressure is not controlled today. His Diovan was just increased to 320 mg by his PCP. I'm going to have him start taking his Lasix standing every day to see if this will help. Was switched from ACE  inhibitor to ARB because of cough. Remains on propranolol which probably cannot be titrated further because of borderline bradycardia. Probably the next option would be a calcium channel blocker such as amlodipine or felodipine.      Relevant Medications   furosemide (LASIX) 20  MG tablet   Other Relevant Orders   EKG 12-Lead (Completed)   Lower extremity edema (Chronic)    Still has intermittent edema. Plan will be to make his Lasix dose of standing dose with the ability to take additional doses when necessary.  I suspect a component of this is related to his sedentary lifestyle with having his feet not probably elevated. Explained that he should keep his legs elevated above his head if possible.         Current medicines are reviewed at length with the patient today. (+/- concerns) n/a The following changes have been made: edema   Patient Instructions   Use lasix 20 mg  Daily -- may take an extra dose if needed.   Your physician wants you to follow-up in 12 months with Dr Herbie Baltimore.You will receive a reminder letter in the mail two months in advance. If you don't receive a letter, please call our office to schedule the follow-up appointment.     Studies Ordered:   Orders Placed This Encounter  Procedures  . EKG 12-Lead      Bryan Lemma, M.D., M.S. Interventional Cardiologist   Pager # [phone] Phone # [phone] [address] [place], [place] [address]

## 2016-10-01 NOTE — Assessment & Plan Note (Signed)
Blood pressure is not controlled today. His Diovan was just increased to 320 mg by his PCP. I'm going to have him start taking his Lasix standing every day to see if this will help. Was switched from ACE inhibitor to ARB because of cough. Remains on propranolol which probably cannot be titrated further because of borderline bradycardia. Probably the next option would be a calcium channel blocker such as amlodipine or felodipine.

## 2016-10-01 NOTE — Assessment & Plan Note (Signed)
Not is well-controlled as I would like to be on current dose of statin. Goal LDL should be less than 70. We'll continue to monitor, but if not at goal by next year would recommend potentially adding Zetia or switching to Crestor. Next consideration would be for PCSK9 inhibitor.

## 2016-10-01 NOTE — Assessment & Plan Note (Signed)
Chronic stable angina, well controlled on current dose of propranolol and Diovan. Only one dose of nitroglycerin in one year is pretty acceptable. Nonischemic Myoview last year. At this rate I probably would check a follow-up Myoview every 3-4 years simply because he doesn't do enough activity to notice any exertional symptoms.  Part of this is because he is really not really mobile from Parkinson's.

## 2016-10-01 NOTE — Assessment & Plan Note (Addendum)
Still has intermittent edema. Plan will be to make his Lasix dose of standing dose with the ability to take additional doses when necessary.  I suspect a component of this is related to his sedentary lifestyle with having his feet not probably elevated. Explained that he should keep his legs elevated above his head if possible.

## 2016-10-03 ENCOUNTER — Ambulatory Visit (INDEPENDENT_AMBULATORY_CARE_PROVIDER_SITE_OTHER): Payer: PPO | Admitting: Neurology

## 2016-10-03 ENCOUNTER — Encounter: Payer: Self-pay | Admitting: Neurology

## 2016-10-03 DIAGNOSIS — G250 Essential tremor: Secondary | ICD-10-CM | POA: Diagnosis not present

## 2016-10-03 NOTE — Progress Notes (Signed)
Reason for visit: Tremor  Referring physician: Dr. Buddy DutyBacigalupo  Brendan [name] is a 76 y.o. male  History of present illness:  Brendan [name] is a 76 year old left-handed white male with a history of an essential tremor. The patient has had tremors since he was in his mid 5350s, the tremor has gradually worsened over time. The tremor has become quite significant to the point where he is unable to do certain activities such as handwriting, he is able to feed himself but at times he may have difficulty spilling food. The patient has been treated with propranolol, Mysoline, and gabapentin without benefit with the tremor to a significant degree. The patient also has a head and neck tremor and some mild tremor affecting the voice. The patient has had some mild memory issues, but he believes that this has been stable over time. The patient does have some mild gait instability, he reports no recent falls, he does not use a cane for ambulation. His primary doctor has noted some mild signs of parkinsonism, and he is referred back to this office for an evaluation.  Past Medical History:  Diagnosis Date  . Anxiety disorder   . Benign enlargement of prostate   . CAD in native artery 1997   Referred for CABG x3 in 2004 for LAD diagonal bifurcation lesion  . Cervical spinal stenosis   . Complex regional pain syndrome of right upper extremity    Right wrist; L arm  . Coronary atherosclerosis of artery bypass graft June 2013   Occluded SVG-D1; Cardiologist Dr. Herbie BaltimoreHarding  . Gait disorder   . Gastroesophageal reflux disease   . Glaucoma   . Hiatal hernia    GI: Dr Evette CristalGanem  . Hyperlipidemia LDL goal <70   . Hypertension   . Memory loss    Mild  . Obesity   . Peripheral neuropathy    possible peripheral neuropathy  . Reflex sympathetic dystrophy   . S/P CABG x 3 2004    LIMA-LAD, SVG to diagonal, SVG to OM  . Tremor, essential    On Primidone  . Unstable angina pectoris [facility])  June 2013   Cardiac  cath: occluded SVG-DI. Patent LIMA-LAD and SVG-OM. EF 45% with apical inferior HK.    Past Surgical History:  Procedure Laterality Date  . CORONARY ARTERY BYPASS GRAFT  06/2002   LIMA-LAD, SVG-D1, SVG-OM  . KNEE SURGERY Left   . LEFT HEART CATHETERIZATION WITH CORONARY/GRAFT ANGIOGRAM N/A 10/13/2011   Procedure: LEFT HEART CATHETERIZATION WITH Isabel CapriceORONARY/GRAFT ANGIOGRAM;  Surgeon: Marykay Lexavid W Harding, MD;  [facility];  Service: Cardiovascular;  Laterality: N/A;  . NM MYOCAR PERF WALL MOTION  03/23/2009   protocol:Bruce, normal perfusion in all regions, post-stress EF 72%, exercise capacity 7METS, EKG negative for ischemia.  . Shoulder Orthoscopic Surgery   06/2003  . TRANSTHORACIC ECHOCARDIOGRAM  April 2014   EF 55-60%; no regional wall motion abnormalities. Relatively normal study normal diastolic parameters/abnormal relaxation  . wrist ganglion cyst Left     Family History  Problem Relation Age of Onset  . Heart disease Mother   . Heart disease Brother   . Tremor Paternal Uncle   . Heart disease Brother   . Drug abuse Daughter   . Hepatitis C Daughter     Social history:  reports that he quit smoking about 48 years ago. His smoking use included Cigarettes. He has a 60.00 pack-year smoking history. He has never used smokeless tobacco. He reports that he does  not drink alcohol or use drugs.  Medications:  Prior to Admission medications   Medication Sig Start Date End Date Taking? Authorizing Provider  aspirin (LONGS ADULT LOW STRENGTH ASA) 81 MG EC tablet Take 81 mg by mouth daily.     Yes [provider]  atorvastatin (LIPITOR) 40 MG tablet Take 1 tablet (40 mg total) by mouth daily. 05/02/16  Yes Bacigalupo, Marzella Schlein, MD  fish oil-omega-3 fatty acids 1000 MG capsule Take 1 g by mouth 2 (two) times daily.   Yes [provider]  furosemide (LASIX) 20 MG tablet Take 1 tablet (20 mg total) by mouth daily. May take an  Extra dose if needed 09/28/16  Yes Marykay Lex, MD  gabapentin (NEURONTIN) 300 MG capsule Take 2 capsules (600 mg total) by mouth 3 (three) times daily. 07/25/16  Yes Bacigalupo, Marzella Schlein, MD  loratadine (CLARITIN) 10 MG tablet Take 1 tablet (10 mg total) by mouth daily. 06/27/16  Yes Leslye Peer, MD  nitroGLYCERIN (NITROSTAT) 0.4 MG SL tablet Place 1 tablet (0.4 mg total) under the tongue every 5 (five) minutes as needed for chest pain. call 911 if chest pain not better 05/01/16  Yes Bacigalupo, Marzella Schlein, MD  omeprazole (PRILOSEC) 20 MG capsule Take 1 capsule (20 mg total) by mouth daily as needed (acid reflux). 05/01/16  Yes Bacigalupo, Marzella Schlein, MD  primidone (MYSOLINE) 250 MG tablet Take 1 tablet (250 mg total) by mouth 2 (two) times daily. 05/01/16  Yes Bacigalupo, Marzella Schlein, MD  propranolol (INDERAL) 80 MG tablet Take 1 tablet (80 mg total) by mouth 2 (two) times daily. 05/01/16  Yes Erasmo Downer, MD  valsartan (DIOVAN) 160 MG tablet  08/18/16  Yes [provider]  valsartan (DIOVAN) 320 MG tablet Take 1 tablet (320 mg total) by mouth daily. 08/24/16  Yes Bacigalupo, Marzella Schlein, MD      Allergies  Allergen Reactions  . Lisinopril Cough    ROS:  Out of a complete 14 system review of symptoms, the patient complains only of the following symptoms, and all other reviewed systems are negative.  Weight gain Swelling in the legs Diarrhea Impotence Achy muscles Runny nose Insomnia  Blood pressure (!) 154/77, pulse 60, height 5\' 6"  (1.676 m), weight 211 lb (95.7 kg).  Physical Exam  General: The patient is alert and cooperative at the time of the examination. The patient is moderately to markedly obese.  Eyes: Pupils are equal, round, and reactive to light. Discs are flat bilaterally.  Neck: The neck is supple, no carotid bruits are noted.  Respiratory: The respiratory examination is clear.  Cardiovascular: The cardiovascular examination reveals a regular rate and rhythm, no obvious murmurs or rubs are  noted.  Skin: Extremities are with 2+ edema below the knees bilaterally.  Neurologic Exam  Mental status: The patient is alert and oriented x 3 at the time of the examination. The patient has apparent normal recent and remote memory, with an apparently normal attention span and concentration ability.  Cranial nerves: Facial symmetry is present. There is good sensation of the face to pinprick and soft touch bilaterally. The strength of the facial muscles and the muscles to head turning and shoulder shrug are normal bilaterally. Speech is well enunciated, no aphasia or dysarthria is noted. Extraocular movements are full. Visual fields are full. The tongue is midline, and the patient has symmetric elevation of the soft palate. No obvious hearing deficits are noted. Mild masking of the face  is seen.  Motor: The motor testing reveals 5 over 5 strength of all 4 extremities. Good symmetric motor tone is noted throughout.  Sensory: Sensory testing is intact to pinprick, soft touch, vibration sensation, and position sense on all 4 extremities. No evidence of extinction is noted.  Coordination: Cerebellar testing reveals good finger-nose-finger and heel-to-shin bilaterally. The patient has an action as well as a resting component to the tremor both upper extremities which is relatively symmetric. The patient does have intention tremors with finger-nose-finger bilaterally.  Gait and station: Gait is slightly wide-based, the patient has good arm swing with ambulation. Tandem gait is unsteady. Romberg is negative. No drift is seen.  Reflexes: Deep tendon reflexes are symmetric and normal bilaterally. Toes are downgoing bilaterally.   Assessment/Plan:  1. Benign essential tremor  The patient does not appear to have features of Parkinson's disease on clinical examination. Many patients with essential tremors do have some features of parkinsonism, but I do not believe this gentleman has Parkinson's disease.  The patient has a mild gait instability issue that is common with individuals with benign essential tremors. I have discussed the possibility of a deep brain stimulator for treatment of the tremor. I do not believe that oral medications alone are likely to significantly have any impact on the degree of tremor. The patient will contact our office if he wishes to have a referral for this purpose.  Marlan Palau MD 10/03/2016 2:10 PM  [facility] [address] [place], [place] [address]  Phone [phone] Fax [phone]

## 2016-10-04 NOTE — Progress Notes (Signed)
   Subjective:   Brendan [name] is a 76 y.o. male with a history of HTN, CAD status post CABG, HLD, complex regional pain syndrome, parkinsonism here for follow-up of HTN  HTN: - Medications: valsartan 320 mg daily - Compliance: good - 1 missed dose in the last month - Checking BP at home: no, but BP was elevated to 150s systolic at 2 recent appts - Diet: low sodium - Exercise: little walking, but is using exercise bike qod - Cough has improved since switching from lisinopril to valsartan - No symptoms of hypotension, chest pain, shortness breath, lower extremity edema, vision changes  Essential tremor - recently saw Dr. Anne HahnWillis who doesn't think this is Parkinsonism - offered specialist appt for discussion of DBS - wants to know if this is a good idea  Seb Derm - present on scalp and around ears for ~1 month - does not itch - never had this before - tried head and shoulders shampoo without resolution  Review of Systems:  Per HPI.   Social History: former smoker  Objective:  BP 130/60   Pulse 60   Temp 98.1 F (36.7 C) (Oral)   Ht 5\' 6"  (1.676 m)   Wt 211 lb 9.6 oz (96 kg)   SpO2 93%   BMI 34.15 kg/m   Gen:  76 y.o. male in NAD HEENT: NCAT, MMM, anicteric sclerae CV: RRR, no MRG Resp: Non-labored, CTAB, no wheezes noted Ext: WWP, no edema MSK: Gait slow and shuffling, no cogwheel rigidity Skin: +seb derm diffusely on scalp Neuro: Alert and oriented, speech normal, resting tremor present in UEs and head/face       Chemistry      Component Value Date/Time   NA 141 05/01/2016 1118   NA 142 11/27/2012 1133   K 5.0 05/01/2016 1118   CL 103 05/01/2016 1118   CO2 31 05/01/2016 1118   BUN 15 05/01/2016 1118   BUN 13 11/27/2012 1133   CREATININE 1.02 05/01/2016 1118      Component Value Date/Time   CALCIUM 9.1 05/01/2016 1118   ALKPHOS 112 05/01/2016 1118   AST 14 05/01/2016 1118   ALT 10 05/01/2016 1118   BILITOT 0.5 05/01/2016 1118      Lab Results    Component Value Date   HGBA1C 5.6 05/01/2016   Assessment & Plan:     Brendan [name] is a 76 y.o. male here for   HYPERTENSION, BENIGN Well-controlled today Likely decreased after last cardiology appointment as he is more consistently taking his Lasix daily Continue valsartan 320 mg daily Was switched from ACE inhibitor to our but because of cough Follow-up in 3 months If it is elevated again, the next option would be a calcium channel blocker  Seborrheic dermatitis of scalp Treat with ketoconazole shampoo  Essential tremor Continue propranolol and primidone Advised him that I would discuss deep brain stimulation with the specialist as advised by neurology Would also continue to advise physical therapy in the future, the patient is not interested at this time   Erasmo DownerBacigalupo, Angela M, MD MPH PGY-3,  [facility] 10/06/2016  9:47 AM

## 2016-10-05 ENCOUNTER — Ambulatory Visit (INDEPENDENT_AMBULATORY_CARE_PROVIDER_SITE_OTHER): Payer: PPO | Admitting: Family Medicine

## 2016-10-05 ENCOUNTER — Encounter: Payer: Self-pay | Admitting: Family Medicine

## 2016-10-05 VITALS — BP 130/60 | HR 60 | Temp 98.1°F | Ht 66.0 in | Wt 211.6 lb

## 2016-10-05 DIAGNOSIS — L219 Seborrheic dermatitis, unspecified: Secondary | ICD-10-CM | POA: Diagnosis not present

## 2016-10-05 DIAGNOSIS — R251 Tremor, unspecified: Secondary | ICD-10-CM | POA: Diagnosis not present

## 2016-10-05 DIAGNOSIS — G250 Essential tremor: Secondary | ICD-10-CM

## 2016-10-05 DIAGNOSIS — I10 Essential (primary) hypertension: Secondary | ICD-10-CM

## 2016-10-05 MED ORDER — KETOCONAZOLE 2 % EX SHAM: 1.0000 "application " | MEDICATED_SHAMPOO | CUTANEOUS | 1 refills | Status: DC

## 2016-10-05 MED ORDER — PROPRANOLOL HCL 80 MG PO TABS: 80.0000 mg | ORAL_TABLET | Freq: Two times a day (BID) | ORAL | 5 refills | Status: DC

## 2016-10-05 MED ORDER — VALSARTAN 320 MG PO TABS: 320.0000 mg | ORAL_TABLET | Freq: Every day | ORAL | 3 refills | Status: DC

## 2016-10-05 MED ORDER — PRIMIDONE 250 MG PO TABS: 250.0000 mg | ORAL_TABLET | Freq: Two times a day (BID) | ORAL | 5 refills | Status: DC

## 2016-10-05 NOTE — Patient Instructions (Signed)
Use ketoconazole shampoo 2-3 times weekly.  You can buy Selsun blue shampoo over the counter and use it in between and after the scalp is better.  No change in blood pressure medicine.  Follow-up in 3 months.   Seborrheic Dermatitis, Adult Seborrheic dermatitis is a skin disease that causes red, scaly patches. It usually occurs on the scalp, and it is often called dandruff. The patches may appear on other parts of the body. Skin patches tend to appear where there are many oil glands in the skin. Areas of the body that are commonly affected include:  Scalp.  Skin folds of the body.  Ears.  Eyebrows.  Neck.  Face.  Armpits.  The bearded area of men's faces.  The condition may come and go for no known reason, and it is often long-lasting (chronic). What are the causes? The cause of this condition is not known. What increases the risk? This condition is more likely to develop in people who:  Have certain conditions, such as: ? HIV (human immunodeficiency virus). ? AIDS (acquired immunodeficiency syndrome). ? Parkinson disease. ? Mood disorders, such as depression.  Are 3840-74 years old.  What are the signs or symptoms? Symptoms of this condition include:  Thick scales on the scalp.  Redness on the face or in the armpits.  Skin that is flaky. The flakes may be white or yellow.  Skin that seems oily or dry but is not helped with moisturizers.  Itching or burning in the affected areas.  How is this diagnosed? This condition is diagnosed with a medical history and physical exam. A sample of your skin may be tested (skin biopsy). You may need to see a skin specialist (dermatologist). How is this treated? There is no cure for this condition, but treatment can help to manage the symptoms. You may get treatment to remove scales, lower the risk of skin infection, and reduce swelling or itching. Treatment may include:  Creams that reduce swelling and irritation  (steroids).  Creams that reduce skin yeast.  Medicated shampoo, soaps, moisturizing creams, or ointments.  Medicated moisturizing creams or ointments.  Follow these instructions at home:  Apply over-the-counter and prescription medicines only as told by your health care provider.  Use any medicated shampoo, soaps, skin creams, or ointments only as told by your health care provider.  Keep all follow-up visits as told by your health care provider. This is important. Contact a health care provider if:  Your symptoms do not improve with treatment.  Your symptoms get worse.  You have new symptoms. This information is not intended to replace advice given to you by your health care provider. Make sure you discuss any questions you have with your health care provider. Document Released: 04/03/2005 Document Revised: 10/22/2015 Document Reviewed: 07/22/2015 Elsevier Interactive Patient Education  [employer]2018 [employer]

## 2016-10-06 NOTE — Assessment & Plan Note (Signed)
Continue propranolol and primidone Advised him that I would discuss deep brain stimulation with the specialist as advised by neurology Would also continue to advise physical therapy in the future, the patient is not interested at this time

## 2016-10-06 NOTE — Assessment & Plan Note (Signed)
Well-controlled today Likely decreased after last cardiology appointment as he is more consistently taking his Lasix daily Continue valsartan 320 mg daily Was switched from ACE inhibitor to our but because of cough Follow-up in 3 months If it is elevated again, the next option would be a calcium channel blocker

## 2016-10-06 NOTE — Assessment & Plan Note (Signed)
Treat with ketoconazole shampoo. 

## 2016-10-16 ENCOUNTER — Telehealth: Payer: Self-pay | Admitting: Neurology

## 2016-10-16 ENCOUNTER — Other Ambulatory Visit: Payer: Self-pay | Admitting: *Deleted

## 2016-10-16 DIAGNOSIS — G250 Essential tremor: Secondary | ICD-10-CM

## 2016-10-16 NOTE — Telephone Encounter (Signed)
Patients wife called office to being referred to have deep brain stimulator consult.  Patient would like to move forward with the referral  Please.  Please call.

## 2016-10-16 NOTE — Telephone Encounter (Signed)
Per vo by Dr. Anne HahnWillis, referral placed for Dr. Lurena Joinerebecca Tat.

## 2016-10-16 NOTE — Telephone Encounter (Signed)
Returned the phone call to patient's wife on HIPAA.  She is aware that the referral will be placed and to expect a call for scheduling.

## 2016-10-19 NOTE — Progress Notes (Signed)
Subjective:   Brendan [name] was seen in consultation in the movement disorder clinic at the request of Dr. [name].  His PCP is [name], MD.  The evaluation is for possible DBS for essential tremor. The records that were made available to me were reviewed. This patient is accompanied in the office by his spouse who supplements the history.  Tremor started approximately 25 years ago and involves the bilateral hands and head.   It started in the L hand but about 5 years ago he noted some tremor in the right hand.   Wife thinks that he has had head tremor for 25 years.  He is L hand dominant.   Tremor is most noticeable when he tries to eat.  Wife does note some rest tremor.   There is a family hx of tremor in paternal uncle, now deceased.    Affected by caffeine: doesn't know (doesn't drink caffeine) Affected by alcohol:  Rarely drinks so doesn't know Affected by stress:  Yes.  , per wife Affected by fatigue:  Yes.   Spills soup if on spoon:  Yes.   Spills glass of liquid if full:  Yes.   Affects ADL's (tying shoes, brushing teeth, etc):  Yes.   (does okay with teeth and shoes but trouble combing hair; shaves with electric)  Current/Previously tried tremor medications: propranolol (80 mg bid), primidone (250 mg bid), gabapentin (600 mg tid)  Current medications that may exacerbate tremor:  n/a  Outside reports reviewed: historical medical records, office notes and referral letter/letters. Reviewed patients most recent CT brain done in 2014 and there was atrophy but otherwise unremarkable.  Any other sx's: Voice: no change Sleep: trouble staying asleep (sleeps in recliner because the tremor wakes him up less)  Vivid Dreams:  No.  Acting out dreams:  No. Wet Pillows: No. Postural symptoms:  Yes.  , when first wakes up  Falls?  No. Bradykinesia symptoms: trouble getting out of chair or couch if low Loss of smell:  No. Loss of taste:  No. Urinary Incontinence:  No. Difficulty  Swallowing:  Yes.   Handwriting, micrographia: unknown - cannot write at all because of tremor Depression:  No. Memory changes:  No. Hallucinations:  No.  visual distortions: No. N/V:  No. Lightheaded:  No.  Syncope: No. Diplopia:  No. Dyskinesia:  No.   Allergies  Allergen Reactions  . Lisinopril Cough    Outpatient Encounter Prescriptions as of 10/20/2016  Medication Sig  . aspirin (LONGS ADULT LOW STRENGTH ASA) 81 MG EC tablet Take 81 mg by mouth daily.    [name] atorvastatin (LIPITOR) 40 MG tablet Take 1 tablet (40 mg total) by mouth daily.  . fish oil-omega-3 fatty acids 1000 MG capsule Take 1 g by mouth 2 (two) times daily.  . furosemide (LASIX) 20 MG tablet Take 1 tablet (20 mg total) by mouth daily. May take an  Extra dose if needed  . gabapentin (NEURONTIN) 300 MG capsule Take 2 capsules (600 mg total) by mouth 3 (three) times daily.  [name] ketoconazole (NIZORAL) 2 % shampoo Apply 1 application topically 2 (two) times a week.  . loratadine (CLARITIN) 10 MG tablet Take 1 tablet (10 mg total) by mouth daily.  . nitroGLYCERIN (NITROSTAT) 0.4 MG SL tablet Place 1 tablet (0.4 mg total) under the tongue every 5 (five) minutes as needed for chest pain. call 911 if chest pain not better  . omeprazole (PRILOSEC) 20 MG capsule Take 1 capsule (20 mg total)  by mouth daily as needed (acid reflux).  . primidone (MYSOLINE) 250 MG tablet Take 1 tablet (250 mg total) by mouth 2 (two) times daily.  . propranolol (INDERAL) 80 MG tablet Take 1 tablet (80 mg total) by mouth 2 (two) times daily.  . simvastatin (ZOCOR) 80 MG tablet Take 80 mg by mouth daily.  . valsartan (DIOVAN) 320 MG tablet Take 1 tablet (320 mg total) by mouth daily.   Facility-Administered Encounter Medications as of 10/20/2016  Medication  . 0.9 %  sodium chloride infusion  . sodium chloride 0.9 % injection 3 mL    Past Medical History:  Diagnosis Date  . Anxiety disorder   . Benign enlargement of prostate   . CAD in native  artery 1997   Referred for CABG x3 in 2004 for LAD diagonal bifurcation lesion  . Cervical spinal stenosis   . Complex regional pain syndrome of right upper extremity    Right wrist; L arm  . Coronary atherosclerosis of artery bypass graft June 2013   Occluded SVG-D1; Cardiologist Dr. Herbie Baltimore  . Gait disorder   . Gastroesophageal reflux disease   . Glaucoma   . Hiatal hernia    GI: Dr Evette Cristal  . Hyperlipidemia LDL goal <70   . Hypertension   . Memory loss    Mild  . Obesity   . Peripheral neuropathy    possible peripheral neuropathy  . S/P CABG x 3 2004    LIMA-LAD, SVG to diagonal, SVG to OM  . Tremor, essential    On Primidone  . Unstable angina pectoris [facility])  June 2013   Cardiac cath: occluded SVG-DI. Patent LIMA-LAD and SVG-OM. EF 45% with apical inferior HK.    Past Surgical History:  Procedure Laterality Date  . CATARACT EXTRACTION, BILATERAL    . CORONARY ARTERY BYPASS GRAFT  06/2002   LIMA-LAD, SVG-D1, SVG-OM  . KNEE SURGERY Left   . LEFT HEART CATHETERIZATION WITH CORONARY/GRAFT ANGIOGRAM N/A 10/13/2011   Procedure: LEFT HEART CATHETERIZATION WITH Isabel Caprice;  Surgeon: Marykay Lex, MD;  Location[facility] C[facility] LAB;  Service: Cardiovascular;  Laterality: N/A;  . NM MYOCAR PERF WALL MOTION  03/23/2009   protocol:Bruce, normal perfusion in all regions, post-stress EF 72%, exercise capacity , EKG negative for ischemia.  . Shoulder Orthoscopic Surgery   06/2003  . TRANSTHORACIC ECHOCARDIOGRAM  April 2014   EF 55-60%; no regional wall motion abnormalities. Relatively normal study normal diastolic parameters/abnormal relaxation  . wrist ganglion cyst Left     Social History   Social History  . Marital status: Married    Spouse name: [name]  . Number of children: 2  . Years of education: 10   Occupational History  . retired     Geneticist, molecular - Event organiser x 30 years   Social History Main Topics  . Smoking status: Former Smoker    Packs/day:  3.00    Years: 20.00    Types: Cigarettes    Quit date: 04/17/1968  . Smokeless tobacco: Never Used  . Alcohol use No     Comment: Occasional 2 mixed drinks or 3 beers.  . Drug use: No  . Sexual activity: No   Other Topics Concern  . Not on file   Social History Narrative   Married.  Wife is Royal Piedra.   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   Former smoker, quit 20 years ago. Does not drink alcohol.   Does not get routine exercise, walks sometimes with a  cane.   Caffeine use: diet-soda   Left handed     Family Status  Relation Status  . Mother Deceased at age 74       CHF  . Father Deceased at age 74       following a fall  . Sister Alive  . Brother Alive  . [employer] Uncle Alive  . Brother Alive       pt. has four brothers  . Sister Alive  . Daughter Alive  . Daughter Alive  . Brother Alive  . Brother Alive    Review of Systems A complete 10 system ROS was obtained and was negative apart from what is mentioned.   Objective:   VITALS:   Vitals:   10/20/16 1313  BP: 138/62  Pulse: 64  SpO2: 93%  Weight: 212 lb (96.2 kg)  Height: 5\' 6"  (1.676 m)   Gen:  Appears stated age and in NAD. HEENT:  Normocephalic, atraumatic. The mucous membranes are moist. The superficial temporal arteries are without ropiness or tenderness.  He has tongue tremor.   Cardiovascular: Regular rate and rhythm. Lungs: Clear to auscultation bilaterally. Neck: There are no carotid bruits noted bilaterally.  NEUROLOGICAL:  Orientation:  The patient is alert and oriented x 3.  Recent and remote memory are intact.  Attention span and concentration are normal.  Able to name objects and repeat without trouble.  Fund of knowledge is appropriate Cranial nerves: There is good facial symmetry. The pupils are equal round and reactive to light bilaterally. Fundoscopic exam reveals clear disc margins bilaterally. Extraocular muscles are intact and visual fields are full to confrontational  testing. Speech is fluent and clear.  He has mild vocal tremor.  Soft palate rises symmetrically and there is no tongue deviation. Hearing is intact to conversational tone. Tone: Tone is good throughout. Sensation: Sensation is intact to light touch and pinprick throughout (facial, trunk, extremities). Vibration is intact at the bilateral big toe but it is decreased. There is no extinction with double simultaneous stimulation. There is no sensory dermatomal level identified. Coordination:  The patient has no dysdiadichokinesia or dysmetria. Motor: Strength is 5/5 in the bilateral upper and lower extremities.  Shoulder shrug is equal bilaterally.  There is no pronator drift.  There are no fasciculations noted. DTR's: Deep tendon reflexes are 2-/4 at the bilateral biceps, triceps, brachioradialis, patella and 1/4 at the bilateral achilles.  Plantar responses are downgoing bilaterally. Gait and Station: The patient ambulates in a wide-based fashion.  He has a mildly stooped posture.  He is mildly unstable.  MOVEMENT EXAM: Tremor:  There is bilateral UE tremor that does have a rest compenent to it.  There is head tremor in the "yes" direction.   He has tongue tremor.   He is completely unable to get the left hand on the paper to even draw or attempt to draw Archimedes spirals.  The right hand is able to get to the paper, but the tremor is so bad that he essentially just draws lines.  When his hands are held out in front of him, tremor is moderate in nature.  When he approaches an object, tremor becomes quite severe.  The left is much worse than the right, especially when given a week to hold.  He is unable to pour water from one glass to another without spilling water everywhere.       Assessment/Plan:   1.  Essential Tremor.  -This is evidenced by the symmetrical nature and  longstanding hx of gradually getting worse.  We discussed nature and pathophysiology.  We discussed that this can continue to  gradually get worse with time.  We discussed that some medications can worsen this, as can caffeine use.  Patient is currently on both a beta blocker and fairly high-dose primidone, without significant success.  He is still having difficulty with activities of daily living.  He is also on second line therapy of gabapentin, 600 mg 3 times per day.  He expresses desire to learn about deep brain stimulation.  I talked to the patient about the logistics associated with DBS therapy.  I talked to the patient about risks/benefits/side effects of DBS therapy.  We talked about risks which included but were not limited to infection, paralysis, intraoperative seizure, death, stroke, bleeding around the electrode.   I talked to patient about fiducial placement 1 week prior to DBS therapy.  I talked to the patient about what to expect in the operating room, including the fact that this is an awake surgery.  We talked about battery placement as well as which is done under general anesthesia, generally approximately one week following the initial surgery.  We also talked about the fact that the patient will need to be off of medications for surgery.  We talked about unilateral versus bilateral placement.  We talked about the fact that bilateral placement can result in a loss of balance and 30% of cases.  We talked about the fact that we would not get control over head tremor unless he had bilateral placement.  They asked multiple questions about the rechargeable device versus the nonrechargeable battery.  They also asked questions about the [employer] versus the [employer] device.  The patient and family were given the opportunity to ask questions, which they did, and I answered them to the best of my ability today.  This visit lasted 80 minutes and much greater than 50% was spent in counseling with the patient and his wife.  His wife took copious notes.  They decided that they were going to think about surgery and discuss  it with their daughter who is a Engineer, civil (consulting) in [place] and let me know if they would like to proceed with first steps, which would be neurocognitive testing.   CC:  [name], MD

## 2016-10-20 ENCOUNTER — Encounter: Payer: Self-pay | Admitting: Neurology

## 2016-10-20 ENCOUNTER — Ambulatory Visit (INDEPENDENT_AMBULATORY_CARE_PROVIDER_SITE_OTHER): Payer: PPO | Admitting: Neurology

## 2016-10-20 VITALS — BP 138/62 | HR 64 | Ht 66.0 in | Wt 212.0 lb

## 2016-10-20 DIAGNOSIS — G250 Essential tremor: Secondary | ICD-10-CM | POA: Diagnosis not present

## 2016-10-25 ENCOUNTER — Telehealth: Payer: Self-pay | Admitting: Neurology

## 2016-10-25 NOTE — Telephone Encounter (Signed)
Patient wife called and wants to talk to someone about the testing that was mentioned to them

## 2016-10-25 NOTE — Telephone Encounter (Signed)
Spoke with patient's wife and answered questions about neurocognitive testing. He is ready to proceed with DBS workup.   Dana please call patient/wife with appt with Dr. Alinda DoomsBailar.

## 2016-10-25 NOTE — Telephone Encounter (Signed)
Patient is sch to see DR Alinda DoomsBailar on 10-26-16

## 2016-10-26 ENCOUNTER — Ambulatory Visit (INDEPENDENT_AMBULATORY_CARE_PROVIDER_SITE_OTHER): Payer: PPO | Admitting: Psychology

## 2016-10-26 DIAGNOSIS — R413 Other amnesia: Secondary | ICD-10-CM

## 2016-10-26 DIAGNOSIS — G250 Essential tremor: Secondary | ICD-10-CM

## 2016-10-26 NOTE — Progress Notes (Signed)
   Neuropsychology Note  Brendan [name] came in today for 2 hours of neuropsychological testing with technician, Brendan [name], Brendan [name], under the supervision of Brendan [name]. The patient did not appear overtly distressed by the testing session, per behavioral observation or via self-report to the technician. Rest breaks were offered. Brendan [name] will return within 2 weeks for a feedback session with Brendan [name] at which time his test performances, clinical impressions and treatment recommendations will be reviewed in detail. The patient understands he can contact our office should he require our assistance before this time.  Full report to follow.

## 2016-10-26 NOTE — Progress Notes (Signed)
NEUROPSYCHOLOGICAL INTERVIEW (CPT: [id number])  Name: Brendan [name] Date of Birth: January 25, 1941 Date of Interview: 10/26/2016  Reason for Referral:  Brendan [name] is a 76 y.o. left handed male who is referred by Dr. Lurena Joiner [name] to assess his current level of cognitive functioning and assist in differential diagnosis. This was also part of a pre-surgical evaluation for deep brain stimulation (DBS) to assist in the management of essential tremor. This patient is accompanied in the office by his wife who supplements the history.  History of Presenting Problem:  Brendan [name] has experienced tremors since he was in his 31s, and has been on several medications for this which have not been entirely successful. He is currently considering DBS procedure for treatment of his ET. Today he denies any concerns about his memory function or cognitive decline, and his wife denies these concerns as well. Medical records note a history of mild memory complaints, stable over time.  Upon direct questioning, the patient and his wife reported the following with regard to current cognitive functioning:   Forgetting recent conversations/events: Sometimes slow retrieval, but "it comes back to me" Repeating statements/questions: No Misplacing/losing items: No Forgetting appointments or other obligations: No Forgetting to take medications: Once in a while, not regularly  Difficulty concentrating: No Processing information more slowly: Yes per wife, patient is not sure  Word-finding difficulty: No Comprehension difficulty: No, except for secondary to hearing loss  Getting lost when driving: He does not drive very often (he is letting his wife do most of the driving now), but he has no history of getting lost Making wrong turns when driving: No Uncertain about directions when driving or passenger: No   The patient lives with his wife. He does very little driving. He independently manages his medications. His wife  manages the finances and bills as he cannot write secondary to his tremor. His wife also has to write the appointments on the calendar for him.   He denies any significant physical complaints aside from tremor. He denies problems with fatigue. He has had some imbalance when walking occasionally, but no falls. With regard to sleep, he reports that he awakens frequently but it does not take him long to get back to sleep. His wife does not know if he demonstrates signs of REM sleep behavior disorder because they sleep in separate rooms.   When asked about history of hallucinations or visual illusions, he reported that several years ago he thought he saw a flying saucer and he was trying to get away from it; however, this has not recurred.   He denied any changes in appetite or eating habits.  With regard to mood, he reports frustration secondary to his tremor when it is especially bad. Otherwise mood is stable. He denies depressed mood. His wife says he is "grouchy" and does not talk to her much, "but he never did". She denies any change in behavior or personality.   Psychiatric history was denied by the patient and his wife, although records indicate a history of anxiety at some point. He denied past or present suicidal ideation or intention. He has never participated in mental health treatment. He did drink alcohol excessively in the past but he stopped that at some point and now only has one drink about twice a year.  Family history is reportedly significant for Alzheimer's dementia in several paternal aunts.    Social History: Born/Raised: [place] Education: 10 years (left school to help his father farm) Occupational history: Retired.  [employer] x 30 years. Retired around 2004. Marital history: Married x56 years, 2 daughters, 5 grands Alcohol: Rare now Tobacco: Former, quit >40 years ago SA: No   Medical History: Past Medical History:  Diagnosis Date  . Anxiety disorder    . Benign enlargement of prostate   . CAD in native artery 1997   Referred for CABG x3 in 2004 for LAD diagonal bifurcation lesion  . Cervical spinal stenosis   . Complex regional pain syndrome of right upper extremity    Right wrist; L arm  . Coronary atherosclerosis of artery bypass graft June 2013   Occluded SVG-D1; Cardiologist Dr. Herbie Baltimore  . Gait disorder   . Gastroesophageal reflux disease   . Glaucoma   . Hiatal hernia    GI: Dr Evette Cristal  . Hyperlipidemia LDL goal <70   . Hypertension   . Memory loss    Mild  . Obesity   . Peripheral neuropathy    possible peripheral neuropathy  . S/P CABG x 3 2004    LIMA-LAD, SVG to diagonal, SVG to OM  . Tremor, essential    On Primidone  . Unstable angina pectoris [facility])  June 2013   Cardiac cath: occluded SVG-DI. Patent LIMA-LAD and SVG-OM. EF 45% with apical inferior HK.      Current Medications:  Outpatient Encounter Prescriptions as of 10/26/2016  Medication Sig  . aspirin (LONGS ADULT LOW STRENGTH ASA) 81 MG EC tablet Take 81 mg by mouth daily.    [name] atorvastatin (LIPITOR) 40 MG tablet Take 1 tablet (40 mg total) by mouth daily.  . fish oil-omega-3 fatty acids 1000 MG capsule Take 1 g by mouth 2 (two) times daily.  . furosemide (LASIX) 20 MG tablet Take 1 tablet (20 mg total) by mouth daily. May take an  Extra dose if needed  . gabapentin (NEURONTIN) 300 MG capsule Take 2 capsules (600 mg total) by mouth 3 (three) times daily.  [name] ketoconazole (NIZORAL) 2 % shampoo Apply 1 application topically 2 (two) times a week.  . loratadine (CLARITIN) 10 MG tablet Take 1 tablet (10 mg total) by mouth daily.  . nitroGLYCERIN (NITROSTAT) 0.4 MG SL tablet Place 1 tablet (0.4 mg total) under the tongue every 5 (five) minutes as needed for chest pain. call 911 if chest pain not better  . omeprazole (PRILOSEC) 20 MG capsule Take 1 capsule (20 mg total) by mouth daily as needed (acid reflux).  . primidone (MYSOLINE) 250 MG tablet Take 1 tablet (250  mg total) by mouth 2 (two) times daily.  . propranolol (INDERAL) 80 MG tablet Take 1 tablet (80 mg total) by mouth 2 (two) times daily.  . simvastatin (ZOCOR) 80 MG tablet Take 80 mg by mouth daily.  . valsartan (DIOVAN) 320 MG tablet Take 1 tablet (320 mg total) by mouth daily.   Facility-Administered Encounter Medications as of 10/26/2016  Medication  . 0.9 %  sodium chloride infusion  . sodium chloride 0.9 % injection 3 mL     Behavioral Observations:   Appearance: Appropriately dressed and groomed. Tremors were observed in both hands, head, lips/mouth Gait: Ambulated independently, no gross abnormalities observed Speech: Fluent, moderate word finding difficulty, increased response latencies. Thought process: Generally linear Affect: Blunted but euthymic  Interpersonal: Pleasant, appropriate, engages more when he is not with his wife   TESTING: There is medical necessity to proceed with neuropsychological assessment as the results will be used to aid in differential diagnosis and clinical decision-making  and to inform specific treatment recommendations. Per medical records, there is a history of mild change in cognitive functioning and a reasonable suspicion of mild cognitive disorder. Additionally, there is medical necessity to proceed with pre-surgical neuropsychological evaluation to inform whether the patient might safely proceed with a medical or surgical procedure that may affect brain function (i.e., deep brain stimulation).   Following the clinical interview, the patient completed a full battery of neuropsychological testing with my psychometrician under my supervision.   PLAN: The patient will return to see me for a follow-up session at which time his test performances and my impressions and treatment recommendations will be reviewed in detail.  Full report to follow.

## 2016-11-01 ENCOUNTER — Other Ambulatory Visit: Payer: Self-pay | Admitting: Family Medicine

## 2016-11-01 DIAGNOSIS — R251 Tremor, unspecified: Secondary | ICD-10-CM

## 2016-11-03 ENCOUNTER — Encounter: Payer: Self-pay | Admitting: Psychology

## 2016-11-06 ENCOUNTER — Encounter: Payer: Self-pay | Admitting: Psychology

## 2016-11-14 ENCOUNTER — Encounter: Payer: Self-pay | Admitting: Psychology

## 2016-11-14 NOTE — Progress Notes (Signed)
NEUROPSYCHOLOGICAL EVALUATION   Name:    Brendan [name]  Date of Birth:   07/04/1940 Date of Interview:  10/26/2016 Date of Testing:  10/26/2016   Date of Feedback:  11/16/2016       Background Information:  Reason for Referral:  Brendan [name] is a 76 y.o. left handed male referred by Dr. Lurena Joinerebecca Tat to assess his current level of cognitive functioning and assist in differential diagnosis. This was also part of a pre-surgical evaluation for deep brain stimulation (DBS) to assist in the management of essential tremor. The current evaluation consisted of a review of available medical records, an interview with the patient and his wife, and the completion of a neuropsychological testing battery. Informed consent was obtained.  History of Presenting Problem:  [name] has experienced tremors since he was in his 6050s, and has been on several medications for this which have not been successful. He is currently considering DBS procedure for treatment of his ET. Today he denies any concerns about his memory function or cognitive decline, and his wife denies these concerns as well. Medical records note a history of mild memory complaints, stable over time.  Upon direct questioning, the patient and his wife reported the following with regard to current cognitive functioning:   Forgetting recent conversations/events: Sometimes slow retrieval, but "it comes back to me" Repeating statements/questions: No Misplacing/losing items: No Forgetting appointments or other obligations: No Forgetting to take medications: Once in a while, not regularly  Difficulty concentrating: No Processing information more slowly: Yes per wife, patient is not sure  Word-finding difficulty: No Comprehension difficulty: No, except for secondary to hearing loss  Getting lost when driving: He does not drive very often (he is letting his wife do most of the driving now), but he has no history of getting lost Making  wrong turns when driving: No Uncertain about directions when driving or passenger: No   The patient lives with his wife. He does very little driving. He independently manages his medications. His wife manages the finances and bills as he cannot write secondary to his tremor. His wife also has to write the appointments on the calendar for him.   He denies any significant physical complaints aside from tremor. He denies problems with fatigue. He has had some imbalance when walking occasionally, but no falls. With regard to sleep, he reports that he awakens frequently but it does not take him long to get back to sleep. His wife does not know if he demonstrates signs of REM sleep behavior disorder because they sleep in separate rooms.   When asked about history of hallucinations or visual illusions, he reported that several years ago he thought he saw a flying saucer and he was trying to get away from it; however, this has not recurred.   He denied any changes in appetite or eating habits.  With regard to mood, he reports frustration secondary to his tremor when it is especially bad. Otherwise mood is stable. He denies depressed mood. His wife says he is "grouchy" and does not talk to her much, "but he never did". She denies any change in behavior or personality.   Psychiatric history was denied by the patient and his wife, although records indicate a history of anxiety at some point. He denied past or present suicidal ideation or intention. He has never participated in mental health treatment. He did drink alcohol excessively in the past but he stopped that at some point and now  only has one drink about twice a year.  Family history is reportedly significant for Alzheimer's dementia in several paternal aunts.    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 [employer] x 30 years. Retired  around 2004. Marital history: Married x56 years, 2 daughters, 5 grands Alcohol: Rare now Tobacco: Former, quit >40 years ago SA: No  Medical History:  Past Medical History:  Diagnosis Date  . Anxiety disorder   . Benign enlargement of prostate   . CAD in native artery 1997   Referred for CABG x3 in 2004 for LAD diagonal bifurcation lesion  . Cervical spinal stenosis   . Complex regional pain syndrome of right upper extremity    Right wrist; L arm  . Coronary atherosclerosis of artery bypass graft June 2013   Occluded SVG-D1; Cardiologist Dr. Herbie Baltimore  . Gait disorder   . Gastroesophageal reflux disease   . Glaucoma   . Hiatal hernia    GI: Dr Evette Cristal  . Hyperlipidemia LDL goal <70   . Hypertension   . Memory loss    Mild  . Obesity   . Peripheral neuropathy    possible peripheral neuropathy  . S/P CABG x 3 2004    LIMA-LAD, SVG to diagonal, SVG to OM  . Tremor, essential    On Primidone  . Unstable angina pectoris [facility])  June 2013   Cardiac cath: occluded SVG-DI. Patent LIMA-LAD and SVG-OM. EF 45% with apical inferior HK.    Current medications:  Outpatient Encounter Prescriptions as of 11/16/2016  Medication Sig  . aspirin (LONGS ADULT LOW STRENGTH ASA) 81 MG EC tablet Take 81 mg by mouth daily.    [name] atorvastatin (LIPITOR) 40 MG tablet Take 1 tablet (40 mg total) by mouth daily.  . fish oil-omega-3 fatty acids 1000 MG capsule Take 1 g by mouth 2 (two) times daily.  . furosemide (LASIX) 20 MG tablet Take 1 tablet (20 mg total) by mouth daily. May take an  Extra dose if needed  . gabapentin (NEURONTIN) 300 MG capsule Take 2 capsules (600 mg total) by mouth 3 (three) times daily.  [name] ketoconazole (NIZORAL) 2 % shampoo Apply 1 application topically 2 (two) times a week.  . loratadine (CLARITIN) 10 MG tablet Take 1 tablet (10 mg total) by mouth daily.  . nitroGLYCERIN (NITROSTAT) 0.4 MG SL tablet Place 1 tablet (0.4 mg total) under the tongue every 5 (five) minutes as needed for  chest pain. call 911 if chest pain not better  . omeprazole (PRILOSEC) 20 MG capsule Take 1 capsule (20 mg total) by mouth daily as needed (acid reflux).  . primidone (MYSOLINE) 250 MG tablet TAKE ONE TABLET BY MOUTH TWICE DAILY  . propranolol (INDERAL) 80 MG tablet Take 1 tablet (80 mg total) by mouth 2 (two) times daily.  . simvastatin (ZOCOR) 80 MG tablet Take 80 mg by mouth daily.  . valsartan (DIOVAN) 320 MG tablet Take 1 tablet (320 mg total) by mouth daily.   Facility-Administered Encounter Medications as of 11/16/2016  Medication  . 0.9 %  sodium chloride infusion  . sodium chloride 0.9 % injection 3 mL     Current Examination:  Behavioral Observations:   Appearance: Appropriately dressed and groomed. Tremors were observed in both hands, head, lips/mouth Gait: Ambulated independently, no gross abnormalities observed Speech: Fluent, moderate word finding difficulty, increased response latencies. Thought process: Generally linear Affect: Blunted but euthymic  Interpersonal:  Pleasant, appropriate, engages more when he is not with his wife Orientation: Oriented to all spheres. Could not name the current President or his predecessor.  Tests Administered: . Test of Premorbid Functioning (TOPF) . Wechsler Adult Intelligence Scale-Fourth Edition (WAIS-IV): Similarities, Block Design, and Digit Span subtests . Wechsler Memory Scale-Fourth Edition (WMS-IV) Older Adult Version (ages 1765-90): Logical Memory I, II and Recognition subtests  . DIRECTV[place] Verbal Learning Test - 2nd Edition (CVLT-2) Short Form . Repeatable Battery for the Assessment of Neuropsychological Status (RBANS) Form A:   Semantic Fluency subtest . Neuropsychological Assessment Battery (NAB) Language Module, Form 1: Naming subtest . Boston Diagnostic Aphasia Examination: Complex Ideational Material subtest . Controlled Oral Word Association Test (COWAT) . Trail Making Test A and B - Oral . Symbol Digit Modalities Test  (SDMT) . Geriatric Depression Scale (GDS) 15 Item . Generalized Anxiety Disorder - 7 item screener (GAD-7) . Parkinson's Disease Questionnaire (PDQ-39)  Test Results: Note: Standardized scores are presented only for use by appropriately trained professionals and to allow for any future test-retest comparison. These scores should not be interpreted without consideration of all the information that is contained in the rest of the report. The most recent standardization samples from the test publisher or other sources were used whenever possible to derive standard scores; scores were corrected for age, gender, ethnicity and education when available.   Test Scores:  Test Name Raw Score Standardized Score Descriptor  TOPF 16/70 SS= 76 Borderline  WAIS-IV Subtests     Similarities 10/36 ss= 4 Impaired  Block Design Pt unable due secondary to tremor    Digit Span Forward 6/16 ss= 5 Borderline  Digit Span Backward 8/16 ss= 10 Average  WMS-IV Subtests     LM I 11/53 ss= 3 Impaired  LM II 6/39 ss= 5 Borderline  LM II Recognition 18/23 Cum %: 26-50 WNL  RBANS Subtest     Semantic Fluency 9/40 Z= -2.1 Impaired  CVLT-II Scores     Trial 1 4/9 Z= -1 Low average  Trial 4 7/9 Z= 0 Average  Trials 1-4 total 22/36 T= 47 Average  SD Free Recall 6/9 Z= -0.5 Average  LD Free Recall 6/9 Z= 0.5 Average  LD Cued Recall 5/9 Z= -0.5 Average  Recognition Discriminability 8/9 hits, 2 false positives Z= 0 Average  Forced Choice Recognition 9/9  WNL  NAB Naming 22/31 T= 27 Impaired  BDAE Complex Ideational Material 11/12  WNL  COWAT-FAS 9 T= 29 Impaired  COWAT-Animals 7 T= 28 Impaired  Oral Trail Making Test A 11" 0 errors    Oral Trail Making Test B 140" 3 errors T= 18 Severely impaired  SDMT - Oral 18/110 Z= -2.1 Impaired  GDS-15 5/15  Mild  GAD-7 0/21  WNL  PDQ-39     Mobility 67.5%    Activities of Daily Living 45.8%    Emotional Well Being 0%    Stigma 31.25%    Social Support 8.33%      Cognitive Impairment 18.75%    Communication 0%    Bodily Discomfort 25%        Description of Test Results:  Premorbid verbal intellectual abilities were estimated to have been within the borderline range based on a test of word reading, which is below expectation even with his lower level of education (10 years of formal education). Information processing speed was impaired on an oral version of a coding task. Basic auditory attention was borderline impaired (could only repeat up to 4 numbers)  while his performance on a test of complex auditory attention and working memory was average (could repeat up to 5 numbers backwards). This discrepancy may reflect variable attention. Visual-spatial construction was unable to be assessed due to the patient's tremor. Language abilities tested were impaired for the most part. Specifically, confrontation naming was impaired, and semantic verbal fluency was impaired. Auditory comprehension of complex ideational material was within normal limits. With regard to verbal memory, encoding and acquisition of non-contextual information (i.e., word list) was average across four learning trials. After a brief distracter task, free recall was average (6/9 items recalled). After a delay, free recall was average with 100% retention (6/9 items recalled). He did not have additional benefit in recall with semantic cueing. Performance on a yes/no recognition task was average. On another verbal memory test, encoding and acquisition of contextual auditory information (i.e., short stories) was impaired. After a delay, free recall was borderline impaired. Performance on a yes/no recognition task was within normal limits. Executive functioning was impaired on tests administered. Mental flexibility and set-shifting were impaired on an oral version of Trails B. Verbal fluency with phonemic search restrictions was impaired. Verbal abstract reasoning was impaired. Clock drawing could not be  performed due to the patient's tremor. On a self-report measure of mood, the patient's responses were indicative of mild depression at the present time. Symptoms endorsed included: dropping interests/activities, preferring to stay home, feeling worthless, reduced energy, and feeling helpless. On a self-report measure of anxiety, the patient did not endorse any generalized anxiety at the present time. On a self-report measure assessing the impact of the patient's essential tremor on daily activities and quality of life, he reported the most difficulty with mobility and ADLs. He also endorsed some stigma related to his tremor (e.g., significant avoidance of situations which involve eating or drinking in public). He did not endorse significant cognitive impairment, problems with social support, communication problems or emotional distress.    Clinical Impressions: Non-amnestic mild cognitive impairment.  Results of cognitive testing demonstrated reduced processing speed, confrontation naming, verbal fluency, and executive functioning. Auditory attention was variable and actually seemed to improve with more challenging tasks. Memory encoding clearly benefited from repetition of to-be-learned information, and he demonstrated good consolidation and retrieval of encoded information. Visual-spatial constructional abilities could not be assessed due to his tremor.   Based on self and informant report, cognitive symptoms are not interfering with his daily functioning and therefore he does not meet criteria for dementia. A diagnosis of MCI is warranted based on the areas of reduced cognitive functioning described above. His cognitive profile is reflective of probable frontal-subcortical dysfunction which could be related to his multiple vascular risk factors, but MCI also is not uncommon in ET.   The patient endorses some mild, subclinical depression. There is no evidence of a primary psychiatric disorder. His tremor  does cause increased stress as it affects ADLs, and some social anxiety.    IMPORTANT CONSIDERATIONS FOR DBS CANDIDACY   1. IS THE PATIENT EXPERIENCING COGNITIVE SYMPTOMS THAT FAR EXCEED WHAT IS EXPECTED FOR ET? No. His test results suggest mild cognitive impairment but not dementia.  2. IS THERE A SEPARATE NEUROLOGICAL PROCESS AT WORK? There could be some vascular cognitive impairment but I do not see evidence of an underlying Alzheimer's disease or other neurodegenerative dementia.  3. WERE ANY PSYCHOSOCIAL STRESSORS IDENTIFIED WITHIN THE INDIVIDUAL AND/OR FAMILY BEYOND ET THAT MAY IMPACT UPON POST-OPERATIVE ADJUSTMENT? No, Mr. Wesch appears to have good social support and coping  skills. His endorsement of mild/subclinical depression is not unexpected given his chronic medical condition.    4. CAN THIS PERSON COPE WITH THE STRESS OF SURGERY AND BE COMPLIANT AS AN AWAKE PARTICIPANT IN SURGERY? It is my impression that he would be able to tolerate all aspects of the DBS procedure.   5. CAN THIS PERSON PARTICIPATE IN THE MULTIPLE POST-OPERATIVE PROGRAMMING SESSIONS AND MEDICATION ADJUSTMENTS? Yes. He also appears to have a good level of social support from family and friends.    6. FROM A NEUROPSYCHOLOGICAL PERSPECTIVE, DOES THIS PERSON APPEAR TO BE A GOOD CANDIDATE FOR DBS? Yes.    Feedback to Patient: AARIN BLUETT returned for a feedback appointment on 11/16/2016 to review the results of his neuropsychological evaluation with this provider. 20 minutes face-to-face time was spent reviewing his test results, my impressions and my recommendations as detailed above.    Total time spent on this patient's case: [id number] unit for interview with psychologist; [phone] units of testing by psychometrician under psychologist's supervision; [phone] units for medical record review, scoring of neuropsychological tests, interpretation of test results, preparation of this report, and review of results to  the patient by psychologist.      Thank you for your referral of ROCCO KERKHOFF. Please feel free to contact me if you have any questions or concerns regarding this report.

## 2016-11-16 ENCOUNTER — Ambulatory Visit (INDEPENDENT_AMBULATORY_CARE_PROVIDER_SITE_OTHER): Payer: PPO | Admitting: Psychology

## 2016-11-16 ENCOUNTER — Telehealth: Payer: Self-pay | Admitting: Neurology

## 2016-11-16 ENCOUNTER — Encounter: Payer: Self-pay | Admitting: Psychology

## 2016-11-16 DIAGNOSIS — G250 Essential tremor: Secondary | ICD-10-CM | POA: Diagnosis not present

## 2016-11-16 DIAGNOSIS — Z01818 Encounter for other preprocedural examination: Secondary | ICD-10-CM

## 2016-11-16 DIAGNOSIS — G3184 Mild cognitive impairment, so stated: Secondary | ICD-10-CM

## 2016-11-16 DIAGNOSIS — R251 Tremor, unspecified: Secondary | ICD-10-CM

## 2016-11-16 DIAGNOSIS — R29818 Other symptoms and signs involving the nervous system: Secondary | ICD-10-CM

## 2016-11-16 NOTE — Telephone Encounter (Signed)
Reviewed cognitive testing and looked good.  Per my last notes, wife was supposed to call if they wanted to continue to proceed with DBS after they spoke with their daughter (a Engineer, civil (consulting[place]  Can you call them and see if they would like to proceed or hold for now?  If proceed, need an appt with Dr. Venetia MaxonStern and pre-op MRI and appointment with me

## 2016-11-17 NOTE — Telephone Encounter (Signed)
Spoke with patient's wife. She wants to proceed with work up. Order placed for MR. Appt made with Dr. Arbutus Leasat. Referral faxed to [facility] at [phone] with confirmation received. They will contact the patient to schedule. She will call with any questions.

## 2016-11-17 NOTE — Telephone Encounter (Signed)
Note received from [facility] that patient has appt on 01/31/17 at 11:30 am.

## 2016-11-21 ENCOUNTER — Telehealth: Payer: Self-pay | Admitting: Neurology

## 2016-11-21 NOTE — Telephone Encounter (Signed)
Patient's wife called Corrie Dandy([name]) and she would  Like to speak with you regarding his appointment with Dr. Venetia MaxonStern on 01/31/17 and then he has one scheduled with Dr. Arbutus Leasat on 02/13/17. She thought he was to see Dr. Arbutus Leasat first? Please Advise. Thanks

## 2016-11-21 NOTE — Telephone Encounter (Signed)
Spoke with patient's wife and made her aware she will need to see Dr. Arbutus Leasat last.   Also MR scheduled for 12/01/16 at 4:00 pm.

## 2016-11-28 ENCOUNTER — Ambulatory Visit (INDEPENDENT_AMBULATORY_CARE_PROVIDER_SITE_OTHER): Payer: PPO | Admitting: Family Medicine

## 2016-11-28 ENCOUNTER — Encounter: Payer: Self-pay | Admitting: Family Medicine

## 2016-11-28 DIAGNOSIS — E785 Hyperlipidemia, unspecified: Secondary | ICD-10-CM

## 2016-11-28 DIAGNOSIS — G250 Essential tremor: Secondary | ICD-10-CM | POA: Diagnosis not present

## 2016-11-28 MED ORDER — ATORVASTATIN CALCIUM 40 MG PO TABS: 40.0000 mg | ORAL_TABLET | Freq: Every day | ORAL | 3 refills | Status: DC

## 2016-11-28 NOTE — Progress Notes (Signed)
   Subjective:   Patient ID: Brendan [name]    DOB: 03/26/1941, 76 y.o. male   MRN: [id number]  CC: to meet new PCP   HPI: Brendan [name] is a 76 y.o. male who presents to clinic today to meet new PCP.   Problems discussed today are as follows:   Movement disorder  Has had a tremor present for some time.  He is to get DBS (deep brain stimulation) procedure soon -will meet with surgeon, Dr. Venetia MaxonStern, on 01/31/2017 and will have an MRI done this week  -Follows with Dr. Arbutus Leasat, neurology   HLD -Zocor 80 mg and Lipitor 40 mg daily listed on home meds.  Patient and his wife endorse that he is taking both.  Recently had lipid panel Jan 2018.    ROS: Denies fevers, chills, nausea, vomiting, abdominal pain, chest pain, shortness of breath.  PMFSH: Pertinent past medical, surgical, family, and social history were reviewed and updated as appropriate. Smoking status reviewed. Medications reviewed.  Objective:   BP 128/66   Pulse (!) 58   Temp 98.5 F (36.9 C) (Oral)   Ht 5\' 6"  (1.676 m)   Wt 212 lb 6.4 oz (96.3 kg)   SpO2 97%   BMI 34.28 kg/m  Vitals and nursing note reviewed.  General: 76 yo male, NAD HEENT: NCAT, MMM, o/p clear  Neck: supple  CV: RRR no MRG  Lungs: CTAB, normal effort  Skin: warm, dry, no rashes or lesions, cap refill < 2 seconds Extremities: warm and well perfused, normal tone, moderate tremor present at rest   Assessment & Plan:   Essential tremor -Pt to undergo deep brain stimulation with specialist.  Appt for 10/17 with Dr. Venetia MaxonStern.  -MRI planned for this week  -Continue propranolol and primidone in the meantime  -will follow up s/p procedure   Hyperlipidemia LDL goal <70 Unclear if patient is to be taking two statin medications. Zocor and Lipitor listed on home med list and pt endorses taking both.  -Repeat lipid panel at next visit  -Can consider discontinuing one if need to.   Meds ordered this encounter  Medications  . atorvastatin (LIPITOR) 40 MG  tablet    Sig: Take 1 tablet (40 mg total) by mouth daily.    Dispense:  90 tablet    Refill:  3   Follow up: 2 months   Freddrick MarchYashika Valta Dillon, MD [facility], PGY-1 12/05/2016 12:57 AM

## 2016-11-28 NOTE — Patient Instructions (Signed)
It was nice meeting you today! You were seen in clinic to establish care with me and we discussed some of your medical problems.  You can call for refills as needed.    Please call clinic with any questions you may have.   Be well,  Brendan MarchYashika Edita Weyenberg, MD

## 2016-12-01 ENCOUNTER — Telehealth: Payer: Self-pay | Admitting: Neurology

## 2016-12-01 ENCOUNTER — Other Ambulatory Visit: Payer: Self-pay | Admitting: Family Medicine

## 2016-12-01 ENCOUNTER — Telehealth: Payer: Self-pay | Admitting: *Deleted

## 2016-12-01 ENCOUNTER — Ambulatory Visit (HOSPITAL_COMMUNITY)
Admission: RE | Admit: 2016-12-01 | Discharge: 2016-12-01 | Disposition: A | Discharge status: Home / Self Care | Payer: PPO | Source: Ambulatory Visit | Attending: Neurology | Admitting: Neurology

## 2016-12-01 DIAGNOSIS — R29818 Other symptoms and signs involving the nervous system: Secondary | ICD-10-CM

## 2016-12-01 DIAGNOSIS — Z01818 Encounter for other preprocedural examination: Secondary | ICD-10-CM | POA: Diagnosis not present

## 2016-12-01 DIAGNOSIS — R251 Tremor, unspecified: Secondary | ICD-10-CM | POA: Insufficient documentation

## 2016-12-01 DIAGNOSIS — I6782 Cerebral ischemia: Secondary | ICD-10-CM | POA: Diagnosis not present

## 2016-12-01 LAB — CREATININE, SERUM
Creatinine, Ser: 1.13 mg/dL (ref 0.61–1.24)
GFR calc Af Amer: >60 mL/min (ref >60)
GFR calc non Af Amer: >60 mL/min (ref >60)

## 2016-12-01 MED ORDER — LOSARTAN POTASSIUM 100 MG PO TABS: 100.0000 mg | ORAL_TABLET | Freq: Every day | ORAL | 3 refills | Status: DC

## 2016-12-01 MED ORDER — GADOBENATE DIMEGLUMINE 529 MG/ML IV SOLN
20.0000 mL | Freq: Once | INTRAVENOUS | Status: AC | PRN
  Administered 2016-12-01: 20 mL via INTRAVENOUS

## 2016-12-01 NOTE — Telephone Encounter (Signed)
Called pharmacy to advise switch to Losartan 100 mg daily.  I have placed orders and discontinued his Valsartan.

## 2016-12-01 NOTE — Telephone Encounter (Signed)
Received fax from [facility] requesting to switch valsartan 320 mg to a different medication.  Valsartan is recalled.  Please advise.  Clovis Pu, RN

## 2016-12-01 NOTE — Telephone Encounter (Signed)
Reviewed MRI brain.  Looks good but no T2 image with correct spacing on image.  Lesly Rubenstein, can you let Grover Canavan know when she comes back.  When is patients appointment with Dr. [name]?

## 2016-12-04 NOTE — Telephone Encounter (Signed)
Appt with Dr. [name] 01/31/17.  Follow up appt with you 02/13/17.

## 2016-12-05 NOTE — Assessment & Plan Note (Signed)
-  Pt to undergo deep brain stimulation with specialist.  Appt for 10/17 with Dr. [name].  -MRI planned for this week  -Continue propranolol and primidone in the meantime  -will follow up s/p procedure

## 2016-12-05 NOTE — Assessment & Plan Note (Addendum)
Unclear if patient is to be taking two statin medications. Zocor and Lipitor listed on home med list and pt endorses taking both.  -Repeat lipid panel at next visit  -Can consider discontinuing one if need to.

## 2017-01-10 ENCOUNTER — Ambulatory Visit (INDEPENDENT_AMBULATORY_CARE_PROVIDER_SITE_OTHER): Payer: PPO | Admitting: *Deleted

## 2017-01-10 DIAGNOSIS — Z23 Encounter for immunization: Secondary | ICD-10-CM | POA: Diagnosis not present

## 2017-01-22 DIAGNOSIS — Z961 Presence of intraocular lens: Secondary | ICD-10-CM | POA: Diagnosis not present

## 2017-01-22 DIAGNOSIS — H01021 Squamous blepharitis right upper eyelid: Secondary | ICD-10-CM | POA: Diagnosis not present

## 2017-01-22 DIAGNOSIS — H40033 Anatomical narrow angle, bilateral: Secondary | ICD-10-CM | POA: Diagnosis not present

## 2017-01-22 DIAGNOSIS — H11823 Conjunctivochalasis, bilateral: Secondary | ICD-10-CM | POA: Diagnosis not present

## 2017-01-22 DIAGNOSIS — H01025 Squamous blepharitis left lower eyelid: Secondary | ICD-10-CM | POA: Diagnosis not present

## 2017-01-22 DIAGNOSIS — H01024 Squamous blepharitis left upper eyelid: Secondary | ICD-10-CM | POA: Diagnosis not present

## 2017-01-22 DIAGNOSIS — H01022 Squamous blepharitis right lower eyelid: Secondary | ICD-10-CM | POA: Diagnosis not present

## 2017-01-25 ENCOUNTER — Encounter: Payer: Self-pay | Admitting: Psychology

## 2017-01-31 DIAGNOSIS — G250 Essential tremor: Secondary | ICD-10-CM | POA: Diagnosis not present

## 2017-01-31 DIAGNOSIS — Z6833 Body mass index (BMI) 33.0-33.9, adult: Secondary | ICD-10-CM | POA: Diagnosis not present

## 2017-01-31 DIAGNOSIS — I10 Essential (primary) hypertension: Secondary | ICD-10-CM | POA: Diagnosis not present

## 2017-02-01 ENCOUNTER — Telehealth: Payer: Self-pay | Admitting: Neurology

## 2017-02-01 NOTE — Telephone Encounter (Signed)
Saw Dr. Venetia MaxonStern 01/31/17.  Sounds like patient still unsure if wanted to proceed.  He told patient would need cardiac clearance if decided to proceed.  I have upcoming appt with him.  If decides to proceed, needs new T2 images

## 2017-02-09 NOTE — Progress Notes (Signed)
Subjective:   Brendan [name] was seen in consultation in the movement disorder clinic at the request of Dr. Anne HahnWillis.  His PCP is Brendan [name], [name], MD.  The evaluation is for possible DBS for essential tremor. The records that were made available to me were reviewed. This patient is accompanied in the office by his spouse who supplements the history.  Tremor started approximately 25 years ago and involves the bilateral hands and head.   It started in the L hand but about 5 years ago he noted some tremor in the right hand.   Wife thinks that he has had head tremor for 25 years.  He is L hand dominant.   Tremor is most noticeable when he tries to eat.  Wife does note some rest tremor.   There is a family hx of tremor in paternal uncle, now deceased.    Affected by caffeine: doesn't know (doesn't drink caffeine) Affected by alcohol:  Rarely drinks so doesn't know Affected by stress:  Yes.  , per wife Affected by fatigue:  Yes.   Spills soup if on spoon:  Yes.   Spills glass of liquid if full:  Yes.   Affects ADL's (tying shoes, brushing teeth, etc):  Yes.   (does okay with teeth and shoes but trouble combing hair; shaves with electric)  Current/Previously tried tremor medications: propranolol (80 mg bid), primidone (250 mg bid), gabapentin (600 mg tid)  Current medications that may exacerbate tremor:  n/a  Outside reports reviewed: historical medical records, office notes and referral letter/letters. Reviewed patients most recent CT brain done in 2014 and there was atrophy but otherwise unremarkable.  Any other sx's: Voice: no change Sleep: trouble staying asleep (sleeps in recliner because the tremor wakes him up less)  Vivid Dreams:  No.  Acting out dreams:  No. Wet Pillows: No. Postural symptoms:  Yes.  , when first wakes up  Falls?  No. Bradykinesia symptoms: trouble getting out of chair or couch if low Loss of smell:  No. Loss of taste:  No. Urinary Incontinence:  No. Difficulty  Swallowing:  Yes.   Handwriting, micrographia: unknown - cannot write at all because of tremor Depression:  No. Memory changes:  No. Hallucinations:  No.  visual distortions: No. N/V:  No. Lightheaded:  No.  Syncope: No. Diplopia:  No. Dyskinesia:  No.  02/13/17 update: Patient presents today in follow-up for his essential tremor.  He is accompanied by his wife and daughter who supplements the history.  He had neurocognitive testing with Dr. Alinda DoomsBailar on October 26, 2016.  There is evidence of mild cognitive impairment but no evidence of dementia.  Saw Dr. Venetia MaxonStern 01/31/17.    At that point in time, the patient was unsure if he wanted to proceed with DBS.  He was told by Dr. Venetia MaxonStern that he would need cardiac clearance.  He did have an MRI of the brain, but the T2 images did not have proper spacing.    Allergies  Allergen Reactions  . Lisinopril Cough    Outpatient Encounter Prescriptions as of 02/13/2017  Medication Sig  . aspirin (LONGS ADULT LOW STRENGTH ASA) 81 MG EC tablet Take 81 mg by mouth daily.    [name]. atorvastatin (LIPITOR) 40 MG tablet Take 1 tablet (40 mg total) by mouth daily.  . fish oil-omega-3 fatty acids 1000 MG capsule Take 1 g by mouth 2 (two) times daily.  . furosemide (LASIX) 20 MG tablet Take 1 tablet (20 mg total) by mouth  daily. May take an  Extra dose if needed  . gabapentin (NEURONTIN) 300 MG capsule Take 2 capsules (600 mg total) by mouth 3 (three) times daily.  [name] ketoconazole (NIZORAL) 2 % shampoo Apply 1 application topically 2 (two) times a week.  . loratadine (CLARITIN) 10 MG tablet Take 1 tablet (10 mg total) by mouth daily.  [name] losartan (COZAAR) 100 MG tablet Take 1 tablet (100 mg total) by mouth daily.  . nitroGLYCERIN (NITROSTAT) 0.4 MG SL tablet Place 1 tablet (0.4 mg total) under the tongue every 5 (five) minutes as needed for chest pain. call 911 if chest pain not better  . omeprazole (PRILOSEC) 20 MG capsule Take 1 capsule (20 mg total) by mouth daily as needed  (acid reflux).  . primidone (MYSOLINE) 250 MG tablet TAKE ONE TABLET BY MOUTH TWICE DAILY  . propranolol (INDERAL) 80 MG tablet Take 1 tablet (80 mg total) by mouth 2 (two) times daily.  . simvastatin (ZOCOR) 80 MG tablet Take 80 mg by mouth daily.   Facility-Administered Encounter Medications as of 02/13/2017  Medication  . 0.9 %  sodium chloride infusion  . sodium chloride 0.9 % injection 3 mL    Past Medical History:  Diagnosis Date  . Anxiety disorder   . Benign enlargement of prostate   . CAD in native artery 1997   Referred for CABG x3 in 2004 for LAD diagonal bifurcation lesion  . Cervical spinal stenosis   . Complex regional pain syndrome of right upper extremity    Right wrist; L arm  . Coronary atherosclerosis of artery bypass graft June 2013   Occluded SVG-D1; Cardiologist Dr. Herbie Baltimore  . Gait disorder   . Gastroesophageal reflux disease   . Glaucoma   . Hiatal hernia    GI: Dr Evette Cristal  . Hyperlipidemia LDL goal <70   . Hypertension   . Memory loss    Mild  . Obesity   . Peripheral neuropathy    possible peripheral neuropathy  . S/P CABG x 3 2004    LIMA-LAD, SVG to diagonal, SVG to OM  . Tremor, essential    On Primidone  . Unstable angina pectoris [facility])  June 2013   Cardiac cath: occluded SVG-DI. Patent LIMA-LAD and SVG-OM. EF 45% with apical inferior HK.    Past Surgical History:  Procedure Laterality Date  . CATARACT EXTRACTION, BILATERAL    . CORONARY ARTERY BYPASS GRAFT  06/2002   LIMA-LAD, SVG-D1, SVG-OM  . KNEE SURGERY Left   . LEFT HEART CATHETERIZATION WITH CORONARY/GRAFT ANGIOGRAM N/A 10/13/2011   Procedure: LEFT HEART CATHETERIZATION WITH Isabel Caprice;  Surgeon: Marykay Lex, MD;  Location: [facility] CATH LAB;  Service: Cardiovascular;  Laterality: N/A;  . NM MYOCAR PERF WALL MOTION  03/23/2009   protocol:Bruce, normal perfusion in all regions, post-stress EF 72%, exercise capacity , EKG negative for ischemia.  . Shoulder Orthoscopic  Surgery   06/2003  . TRANSTHORACIC ECHOCARDIOGRAM  April 2014   EF 55-60%; no regional wall motion abnormalities. Relatively normal study normal diastolic parameters/abnormal relaxation  . wrist ganglion cyst Left     Social History   Social History  . Marital status: Married    Spouse name: [name]  . Number of children: 2  . Years of education: 10   Occupational History  . retired     Geneticist, molecular - Event organiser x 30 years   Social History Main Topics  . Smoking status: Former Smoker    Packs/day: 3.00  Years: 20.00    Types: Cigarettes    Quit date: 04/17/1968  . Smokeless tobacco: Never Used  . Alcohol use No     Comment: Occasional 2 mixed drinks or 3 beers.  . Drug use: No  . Sexual activity: No   Other Topics Concern  . Not on file   Social History Narrative   Married.  Wife is Royal Piedra.   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   Former smoker, quit 20 years ago. Does not drink alcohol.   Does not get routine exercise, walks sometimes with a cane.   Caffeine use: diet-soda   Left handed     Family Status  Relation Status  . Mother Deceased at age 55       CHF  . Father Deceased at age 7       following a fall  . Sister Alive  . Brother Alive  . [employer] Alive  . Brother Alive       pt. has four brothers  . Sister Alive  . Daughter Alive  . Daughter Alive  . Brother Alive  . Brother Alive    Review of Systems A complete 10 system ROS was obtained and was negative apart from what is mentioned.   Objective:   VITALS:   There were no vitals filed for this visit. Gen:  Appears stated age and in NAD. HEENT:  Normocephalic, atraumatic. The mucous membranes are moist. The superficial temporal arteries are without ropiness or tenderness.  He has tongue tremor.   Cardiovascular: Regular rate and rhythm. Lungs: Clear to auscultation bilaterally. Neck: There are no carotid bruits noted bilaterally.  NEUROLOGICAL:  Orientation:   The patient is alert and oriented x 3.  Cranial nerves: There is good facial symmetry. Extraocular muscles are intact and visual fields are full to confrontational testing. Speech is fluent and clear.  He has mild vocal tremor.  Soft palate rises symmetrically and there is no tongue deviation. Hearing is intact to conversational tone. Tone: Tone is good throughout. Sensation: Sensation is intact to light touch throughout Coordination:  The patient has no dysdiadichokinesia or dysmetria. Motor: Strength is 5/5 in the bilateral upper and lower extremities.  Shoulder shrug is equal bilaterally.  There is no pronator drift.  There are no fasciculations noted. Gait and Station: The patient ambulates in a wide-based fashion.  He has a mildly stooped posture.  He is mildly unstable.  MOVEMENT EXAM: Tremor:  There is bilateral UE tremor that does have a rest compenent to it.  There is head tremor in the "yes" direction.     When his hands are held out in front of him, tremor is moderate in nature.  When he approaches an object, tremor becomes quite severe.  The left is much worse than the right, especially when given a week to hold.  The right is fairly mild.  He is unable to pour water from one glass to another without spilling water everywhere.       Assessment/Plan:   1.  Essential Tremor.  -This is evidenced by the symmetrical nature and longstanding hx of gradually getting worse.  We discussed nature and pathophysiology.  We discussed that this can continue to gradually get worse with time.  We discussed that some medications can worsen this, as can caffeine use.  Patient is currently on both a beta blocker and fairly high-dose primidone, without significant success.  He is still having difficulty with activities of daily living.  He is also on second line therapy of gabapentin, 600 mg 3 times per day.   -While he has had an MRI of the brain, he will need to be sent back for new T2 images as they did  not have proper spacing for DBS planning.  -he would like a medtronic device after discussing R/B/SE of devices on market  -he wants both hands done, understanding that there is a 30% risk of loss of balance.  He is L hand dominant.    -will need weaned off of primidone slowly and will need to be off of gabapentin as well  -will need cardiac clearance  -once above are done, will schedule for pre-op video DBS  -I talked to the patient, wife and daughter about the logistics associated with DBS therapy.  I talked to the patient about risks/benefits/side effects of DBS therapy.  We talked about risks which included but were not limited to infection, paralysis, intraoperative seizure, death, stroke, bleeding around the electrode.   I talked to patient about fiducial placement 1 week prior to DBS therapy.  I talked to the patient about what to expect in the operating room, including the fact that this is an awake surgery.  We talked about battery placement as well as which is done under general anesthesia, generally approximately one week following the initial surgery.  We also talked about the fact that the patient will need to be off of medications for surgery.  The patient and family were given the opportunity to ask questions, which they did, and I answered them to the best of my ability today.  2.  F/u after above.  Much greater than 50% of this visit was spent in counseling and coordinating care.  Total face to face time:  40 min   CC:  Brendan March, MD

## 2017-02-13 ENCOUNTER — Telehealth: Payer: Self-pay | Admitting: *Deleted

## 2017-02-13 ENCOUNTER — Encounter: Payer: Self-pay | Admitting: Neurology

## 2017-02-13 ENCOUNTER — Ambulatory Visit (INDEPENDENT_AMBULATORY_CARE_PROVIDER_SITE_OTHER): Payer: PPO | Admitting: Neurology

## 2017-02-13 VITALS — BP 144/70 | HR 63 | Ht 66.0 in | Wt 207.0 lb

## 2017-02-13 DIAGNOSIS — G250 Essential tremor: Secondary | ICD-10-CM | POA: Diagnosis not present

## 2017-02-13 NOTE — Telephone Encounter (Signed)
PATIENT OF DR HARDING.     [facility] Pre-operative Risk Assessment    Request for surgical clearance:  1. What type of surgery is being performed?  DBS SURGERY  2. When is this surgery scheduled? TBA  3. Are there any medications that need to be held prior to surgery and how long? UNKNOWN  4. Practice name and name of physician performing surgery? [facility]  -- DR REBECCA TAT,DO  5. What is your office phone and fax number? PHONE [phone]   FAX [phone] ATTN JADE  6. Anesthesia type (None, local, MAC, general) ? UNKNOWN   Brendan [name] 02/13/2017, 1:14 PM  _________________________________________________________________   (provider comments below)

## 2017-02-13 NOTE — Patient Instructions (Signed)
We will request Cardiac Clearance from Dr. Herbie BaltimoreHarding and let you know when we receive this. We will also need to repeat your MRI.

## 2017-02-14 DIAGNOSIS — H01022 Squamous blepharitis right lower eyelid: Secondary | ICD-10-CM | POA: Diagnosis not present

## 2017-02-14 DIAGNOSIS — H01024 Squamous blepharitis left upper eyelid: Secondary | ICD-10-CM | POA: Diagnosis not present

## 2017-02-14 DIAGNOSIS — H40033 Anatomical narrow angle, bilateral: Secondary | ICD-10-CM | POA: Diagnosis not present

## 2017-02-14 DIAGNOSIS — Z961 Presence of intraocular lens: Secondary | ICD-10-CM | POA: Diagnosis not present

## 2017-02-14 DIAGNOSIS — H01021 Squamous blepharitis right upper eyelid: Secondary | ICD-10-CM | POA: Diagnosis not present

## 2017-02-14 DIAGNOSIS — H01025 Squamous blepharitis left lower eyelid: Secondary | ICD-10-CM | POA: Diagnosis not present

## 2017-02-14 NOTE — Telephone Encounter (Signed)
Discussed with Dr. Herbie BaltimoreHarding.       Chart reviewed as part of pre-operative protocol coverage. Given past medical history and time since last visit, based on ACC/AHA guidelines, Brendan [name] would be at acceptable risk for the planned procedure without further cardiovascular testing.   Nada BoozerLaura Ingold, NP 02/14/2017, 10:39 PM

## 2017-02-14 NOTE — Telephone Encounter (Signed)
  Pt has chronic stable angina, has not needed NTG in some time.    RCRI risk is 0.9% risk of major cardiac events perioperatively. I will send to Dr. Herbie BaltimoreHarding to get his opinion as well.

## 2017-02-15 ENCOUNTER — Telehealth: Payer: Self-pay | Admitting: Cardiology

## 2017-02-15 NOTE — Telephone Encounter (Signed)
Please forward to Ronie Spiesayna Dunn, PA

## 2017-02-15 NOTE — Telephone Encounter (Signed)
New Message     Wife is returning call about pre op clearance

## 2017-02-15 NOTE — Telephone Encounter (Signed)
    Chart reviewed as part of pre-operative protocol coverage; decision has already been established by previous NP who had discussed with Dr. Herbie BaltimoreHarding. Will forward bundled note to requesting provider.   Laurann Montanaayna N Aubery Date, PA-C  02/15/2017, 12:27 PM

## 2017-02-15 NOTE — Telephone Encounter (Signed)
Multiple PAs not involved in this patient's care seem to have gotten this note in error. Please disregard.

## 2017-02-15 NOTE — Telephone Encounter (Signed)
Returned pts wife, Corrie DandyMary, [place], call.  She was making sure that the clearance has been taken care of. I have assured her that Ronie Spiesayna Dunn, PA-C has sent the clearance over to Dr. Fredrich BirksStern's office. She thanked me for the call back.

## 2017-02-15 NOTE — Telephone Encounter (Signed)
Faxed via EPIC

## 2017-02-19 ENCOUNTER — Telehealth: Payer: Self-pay | Admitting: Neurology

## 2017-02-19 DIAGNOSIS — R251 Tremor, unspecified: Secondary | ICD-10-CM

## 2017-02-19 DIAGNOSIS — Z01818 Encounter for other preprocedural examination: Secondary | ICD-10-CM

## 2017-02-19 NOTE — Telephone Encounter (Signed)
Pt got cardiac clearance from Dr. Herbie BaltimoreHarding.  I called Dr. Venetia MaxonStern to let him know but had to leave message.  Where are we with scheduling MRI?

## 2017-02-19 NOTE — Telephone Encounter (Signed)
Just spoke with Grover CanavanKrystal today and she will contact patient to r/s MR. Order entered.

## 2017-02-21 ENCOUNTER — Telehealth: Payer: Self-pay | Admitting: Neurology

## 2017-02-21 NOTE — Telephone Encounter (Signed)
Spoke with Pam and made her aware auth information is in the referral. Already completed.

## 2017-02-21 NOTE — Telephone Encounter (Signed)
Pam left a voicemail message saying she needs a pre auth for pt's MRI by 2:00 today or they will need to reschedule pt's appointment CB# [phone]

## 2017-02-22 ENCOUNTER — Telehealth: Payer: Self-pay | Admitting: Family Medicine

## 2017-02-22 ENCOUNTER — Ambulatory Visit (HOSPITAL_COMMUNITY)
Admission: RE | Admit: 2017-02-22 | Discharge: 2017-02-22 | Disposition: A | Discharge status: Home / Self Care | Payer: PPO | Source: Ambulatory Visit | Attending: Neurology | Admitting: Neurology

## 2017-02-22 DIAGNOSIS — R251 Tremor, unspecified: Secondary | ICD-10-CM

## 2017-02-22 DIAGNOSIS — Z01818 Encounter for other preprocedural examination: Secondary | ICD-10-CM

## 2017-02-22 DIAGNOSIS — I6782 Cerebral ischemia: Secondary | ICD-10-CM | POA: Diagnosis not present

## 2017-02-22 NOTE — Telephone Encounter (Signed)
[employer] disability [employer] form dropped off for at front desk for completion.  Verified that patient section of form has been completed.  Last DOS/WCC with PCP was 11/28/16.  Placed form in red team folder to be completed by clinical staff.  Lina Sarheryl A Stanley

## 2017-02-23 NOTE — Telephone Encounter (Signed)
Placed in MDs Box. Jannine Abreu, CMA  

## 2017-02-26 NOTE — Telephone Encounter (Signed)
Have filled this out and spoke to his wife -- will leave it at the front desk and she is going to pick it up at his upcoming office visit.

## 2017-03-01 ENCOUNTER — Other Ambulatory Visit: Payer: Self-pay | Admitting: Neurosurgery

## 2017-03-01 ENCOUNTER — Other Ambulatory Visit (HOSPITAL_COMMUNITY): Payer: Self-pay | Admitting: Neurosurgery

## 2017-03-01 DIAGNOSIS — G250 Essential tremor: Secondary | ICD-10-CM

## 2017-03-06 ENCOUNTER — Encounter: Payer: Self-pay | Admitting: Family Medicine

## 2017-03-06 ENCOUNTER — Ambulatory Visit: Payer: PPO | Admitting: Family Medicine

## 2017-03-06 DIAGNOSIS — G250 Essential tremor: Secondary | ICD-10-CM | POA: Diagnosis not present

## 2017-03-06 NOTE — Progress Notes (Signed)
   Subjective:   Patient ID: Brendan BoJerry W Closson    DOB: 11/02/1940, 76 y.o. male   MRN: [id number]  CC: Physical  HPI: Brendan [name] is a 76 y.o. male who presents to clinic today for routine checkup. Problems discussed today are as follows:  Movement disorder Patient reports no worsening or new symptoms.  He is to start deep brain stimulation in January with Dr. Venetia MaxonStern.  He has been taking propranolol and primidone in the meantime and reports good compliance.  Health maintenance: -Patient due for tetanus shot and colonoscopy   ROS: No fevers, chills, nausea, vomiting, abdominal pain, chest pain, shortness of breath, palpitations.  Positive for tremors. PMFSH: Pertinent past medical, surgical, family, and social history were reviewed and updated as appropriate. Smoking status reviewed. Medications reviewed. Objective:   BP 118/61   Pulse (!) 54   Temp 98.7 F (37.1 C) (Oral)   Wt 203 lb (92.1 kg)   SpO2 94%   BMI 32.77 kg/m  Vitals and nursing note reviewed.  General: 76 year old male, NAD, appears comfortable Neck: Supple, no JVD CV: RRR no MRG Lungs: CTAB, normal work of breathing Abdomen: Soft, nontender, nondistended, positive bowel sounds Skin: warm, dry, no rashes or lesions, brisk cap refill Extremities: warm and well perfused, with mild hand tremor, normal tone Neuro: Alert, oriented x3, no focal deficits, speech is normal, gait is unsteady  Assessment & Plan:   Essential tremor Stable without new or worsening symptoms.  Follows with Dr. Venetia MaxonStern.  MRI with stable change and no acute findings.  Patient is to begin deep brain stimulation this January. --Continue propranolol and primidone -We will continue to monitor  Health maintenance: -Rx for Tetanus shot sent to pharmacy   -declines colonoscopy   Orders Placed This Encounter  Procedures  . Tdap vaccine greater than or equal to 7yo IM   Follow-up: 3 months  Freddrick MarchYashika Shellye Zandi, MD [facility],  PGY-2 03/06/2017 4:50 PM

## 2017-03-06 NOTE — Patient Instructions (Addendum)
You were seen in clinic today for a physical exam and are doing great.  You received your tetanus shot today.  We discussed colonoscopy however you declined at this time.  If you feel like you want to have one performed, you can call clinic for information to arrange for this.  Please follow up after your deep brain stimulation has started.    Be well, Brendan MarchYashika Aniceto Kyser, MD

## 2017-03-06 NOTE — Assessment & Plan Note (Signed)
Stable without new or worsening symptoms.  Follows with Dr. Venetia MaxonStern.  MRI with stable change and no acute findings.  Patient is to begin deep brain stimulation this January. --Continue propranolol and primidone -We will continue to monitor

## 2017-03-15 ENCOUNTER — Telehealth: Payer: Self-pay

## 2017-03-15 NOTE — Telephone Encounter (Signed)
Chart reviewed as part of pre-operative protocol coverage. Given past medical history and time since last visit, based on ACC/AHA guidelines, Brendan [name] would be at acceptable risk for the planned procedure without further cardiovascular testing.   Brendan Kicks, NP 02/14/2017, 10:39 PM     Brendan [name] Pre-operative Risk Assessment    Request for surgical clearance:  1. What type of surgery is being performed?  BILATERAL DEEP BRAIN STIMULATOR   2. When is this surgery scheduled? 05-04-17   3. Are there any medications that need to be held prior to surgery and how long? NONE LISTED   4. Practice name and name of physician performing surgery? [facility]  Brendan Levine, MD  5. What is your office phone and fax number? [phone]      6. Anesthesia type (None, local, MAC, general) ? NONE LISTED  PT WAS CLEARED FOR SURGERY ON 04-26-16 FIDUCIAL PLACEMENT WITH DR Brendan [name] ALSO, SEE NP NOTE ABOVE  Brendan [name] 03/15/2017, 3:42 PM  _________________________________________________________________   (provider comments below)

## 2017-03-16 NOTE — Telephone Encounter (Signed)
    Chart reviewed as part of pre-operative protocol coverage; decision has already been established by previous NP who had discussed with Dr. Herbie BaltimoreHarding (see below - Nada BoozerLaura Ingold cleared for surgery). Will forward bundled note to requesting provider.   Laurann Montanaayna N Reinaldo Helt, PA-C  02/15/2017, 12:27 PM

## 2017-04-17 HISTORY — PX: TRANSTHORACIC ECHOCARDIOGRAM: SHX275

## 2017-04-19 ENCOUNTER — Telehealth: Payer: Self-pay | Admitting: Neurology

## 2017-04-19 MED ORDER — PRIMIDONE 50 MG PO TABS: ORAL_TABLET | ORAL | 0 refills | Status: DC

## 2017-04-19 NOTE — Progress Notes (Signed)
Subjective:   Brendan [name] was seen in consultation in the movement disorder clinic at the request of Dr. [name].  His PCP is [name], MD.  The evaluation is for possible DBS for essential tremor. The records that were made available to me were reviewed. This patient is accompanied in the office by his spouse who supplements the history.  Tremor started approximately 25 years ago and involves the bilateral hands and head.   It started in the L hand but about 5 years ago he noted some tremor in the right hand.   Wife thinks that he has had head tremor for 25 years.  He is L hand dominant.   Tremor is most noticeable when he tries to eat.  Wife does note some rest tremor.   There is a family hx of tremor in paternal uncle, now deceased.    Affected by caffeine: doesn't know (doesn't drink caffeine) Affected by alcohol:  Rarely drinks so doesn't know Affected by stress:  Yes.  , per wife Affected by fatigue:  Yes.   Spills soup if on spoon:  Yes.   Spills glass of liquid if full:  Yes.   Affects ADL's (tying shoes, brushing teeth, etc):  Yes.   (does okay with teeth and shoes but trouble combing hair; shaves with electric)  Current/Previously tried tremor medications: propranolol (80 mg bid), primidone (250 mg bid), gabapentin (600 mg tid)  Current medications that may exacerbate tremor:  n/a  Outside reports reviewed: historical medical records, office notes and referral letter/letters. Reviewed patients most recent CT brain done in 2014 and there was atrophy but otherwise unremarkable.  Any other sx's: Voice: no change Sleep: trouble staying asleep (sleeps in recliner because the tremor wakes him up less)  Vivid Dreams:  No.  Acting out dreams:  No. Wet Pillows: No. Postural symptoms:  Yes.  , when first wakes up  Falls?  No. Bradykinesia symptoms: trouble getting out of chair or couch if low Loss of smell:  No. Loss of taste:  No. Urinary Incontinence:  No. Difficulty  Swallowing:  Yes.   Handwriting, micrographia: unknown - cannot write at all because of tremor Depression:  No. Memory changes:  No. Hallucinations:  No.  visual distortions: No. N/V:  No. Lightheaded:  No.  Syncope: No. Diplopia:  No. Dyskinesia:  No.  02/13/17 update: Patient presents today in follow-up for his essential tremor.  He is accompanied by his wife and daughter who supplements the history.  He had neurocognitive testing with Dr. [name] on October 26, 2016.  There is evidence of mild cognitive impairment but no evidence of dementia.  Saw Dr. [name] 01/31/17.    At that point in time, the patient was unsure if he wanted to proceed with DBS.  He was told by Dr. [name] that he would need cardiac clearance.  He did have an MRI of the brain, but the T2 images did not have proper spacing.    04/20/17 update: Patient is seen today for her essential tremor for preop video and any last minute questions.  He is accompanied by his wife who supplements the history.  The patient is scheduled for fiducial placement on April 26, 2017.  He is scheduled for bilateral VIM DBS on May 04, 2017.  The patient is currently on primidone, 250 mg twice daily.  He is also on gabapentin, 600 mg 3 times per day.  Allergies  Allergen Reactions  . Lisinopril Cough    Outpatient  Encounter Medications as of 04/20/2017  Medication Sig  . aspirin (LONGS ADULT LOW STRENGTH ASA) 81 MG EC tablet Take 81 mg by mouth daily.    [name] atorvastatin (LIPITOR) 40 MG tablet Take 1 tablet (40 mg total) by mouth daily.  . fish oil-omega-3 fatty acids 1000 MG capsule Take 1 g by mouth 2 (two) times daily.  . furosemide (LASIX) 20 MG tablet Take 1 tablet (20 mg total) by mouth daily. May take an  Extra dose if needed (Patient taking differently: Take 20 mg by mouth See admin instructions. Take 20 mg by mouth daily. May take an extra 20 mg if needed for fluid)  . gabapentin (NEURONTIN) 300 MG capsule Take 2 capsules (600 mg total) by  mouth 3 (three) times daily.  [name] ketoconazole (NIZORAL) 2 % shampoo Apply 1 application topically 2 (two) times a week. (Patient taking differently: Apply 1 application topically once a week. )  . loratadine (CLARITIN) 10 MG tablet Take 1 tablet (10 mg total) by mouth daily.  [name] losartan (COZAAR) 100 MG tablet Take 1 tablet (100 mg total) by mouth daily.  [name] neomycin-bacitracin-polymyxin (NEOSPORIN) ointment Apply 1 application topically as needed for wound care.  . nitroGLYCERIN (NITROSTAT) 0.4 MG SL tablet Place 1 tablet (0.4 mg total) under the tongue every 5 (five) minutes as needed for chest pain. call 911 if chest pain not better  . omeprazole (PRILOSEC) 20 MG capsule Take 1 capsule (20 mg total) by mouth daily as needed (acid reflux).  . primidone (MYSOLINE) 50 MG tablet 04/19/17: 250 mg am, 200 mg hs; 04/20/17: 200 mg am, 200 mg hs, 04/21/17: 200 mg am, 150 mg hs; 1/6-1/7: 150 mg BID; 1/8-1/9: 150 mg am, 100 mg hs; 1/10-1/11: 100 mg BID; 1/12-1/13: 100 mg am, 50 mg hs; 1/14: 50 mg BID, 1/15: 50 mg QD, 1/16: Stop  . propranolol (INDERAL) 80 MG tablet Take 1 tablet (80 mg total) by mouth 2 (two) times daily.  [name] Propylene Glycol (SYSTANE BALANCE) 0.6 % SOLN Place 1 drop into both eyes daily as needed (for dry eyes).   Facility-Administered Encounter Medications as of 04/20/2017  Medication  . 0.9 %  sodium chloride infusion  . sodium chloride 0.9 % injection 3 mL    Past Medical History:  Diagnosis Date  . Anxiety disorder   . Benign enlargement of prostate   . CAD in native artery 1997   Referred for CABG x3 in 2004 for LAD diagonal bifurcation lesion  . Cervical spinal stenosis   . Complex regional pain syndrome of right upper extremity    Right wrist; L arm  . Coronary atherosclerosis of artery bypass graft June 2013   Occluded SVG-D1; Cardiologist Dr. Herbie Baltimore  . Gait disorder   . Gastroesophageal reflux disease   . Glaucoma   . Hiatal hernia    GI: Dr Evette Cristal  . Hyperlipidemia LDL goal  <70   . Hypertension   . Memory loss    Mild  . Obesity   . Peripheral neuropathy    possible peripheral neuropathy  . S/P CABG x 3 2004    LIMA-LAD, SVG to diagonal, SVG to OM  . Tremor, essential    On Primidone  . Unstable angina pectoris [facility])  June 2013   Cardiac cath: occluded SVG-DI. Patent LIMA-LAD and SVG-OM. EF 45% with apical inferior HK.    Past Surgical History:  Procedure Laterality Date  . CATARACT EXTRACTION, BILATERAL    . CORONARY ARTERY BYPASS GRAFT  06/2002   LIMA-LAD, SVG-D1, SVG-OM  . KNEE SURGERY Left   . LEFT HEART CATHETERIZATION WITH CORONARY/GRAFT ANGIOGRAM N/A 10/13/2011   Procedure: LEFT HEART CATHETERIZATION WITH Isabel Caprice;  Surgeon: Marykay Lex, MD;  Location: [facility] CATH LAB;  Service: Cardiovascular;  Laterality: N/A;  . NM MYOCAR PERF WALL MOTION  03/23/2009   protocol:Bruce, normal perfusion in all regions, post-stress EF 72%, exercise capacity , EKG negative for ischemia.  . Shoulder Orthoscopic Surgery   06/2003  . TRANSTHORACIC ECHOCARDIOGRAM  April 2014   EF 55-60%; no regional wall motion abnormalities. Relatively normal study normal diastolic parameters/abnormal relaxation  . wrist ganglion cyst Left     Social History   Socioeconomic History  . Marital status: Married    Spouse name: [name]  . Number of children: 2  . Years of education: 10  . Highest education level: Not on file  Social Needs  . Financial resource strain: Not on file  . Food insecurity - worry: Not on file  . Food insecurity - inability: Not on file  . Transportation needs - medical: Not on file  . Transportation needs - non-medical: Not on file  Occupational History  . Occupation: retired    Comment: Geneticist, molecular - facilities management x 30 years  Tobacco Use  . Smoking status: Former Smoker    Packs/day: 3.00    Years: 20.00    Pack years: 60.00    Types: Cigarettes    Last attempt to quit: 04/17/1968    Years since quitting: 49.0  .  Smokeless tobacco: Never Used  Substance and Sexual Activity  . Alcohol use: No    Comment: Occasional 2 mixed drinks or 3 beers.  . Drug use: No  . Sexual activity: No  Other Topics Concern  . Not on file  Social History Narrative   Married.  Wife is Royal Piedra.   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   Former smoker, quit 20 years ago. Does not drink alcohol.   Does not get routine exercise, walks sometimes with a cane.   Caffeine use: diet-soda   Left handed     Family Status  Relation Name Status  . Mother  Deceased at age 33       CHF  . Father  Deceased at age 38       following a fall  . Sister  Alive  . Brother  Alive  . [employer]  Alive  . Brother  Alive       pt. has four brothers  . Sister  Alive  . Daughter  Alive  . Daughter  Alive  . Brother  Alive  . Brother  Alive    Review of Systems A complete 10 system ROS was obtained and was negative apart from what is mentioned.   Objective:   VITALS:   Vitals:   04/20/17 1125  BP: 140/68  Pulse: (!) 54  SpO2: 94%  Weight: 202 lb (91.6 kg)  Height: 5\' 6"  (1.676 m)   Gen:  Appears stated age and in NAD. HEENT:  Normocephalic, atraumatic. The mucous membranes are moist. The superficial temporal arteries are without ropiness or tenderness.  He has tongue tremor.   Cardiovascular: Regular rate and rhythm. Lungs: Clear to auscultation bilaterally. Neck: There are no carotid bruits noted bilaterally.  NEUROLOGICAL:  Orientation:  The patient is alert and oriented x 3.  Cranial nerves: There is good facial symmetry. Extraocular muscles are intact and visual fields are full  to confrontational testing. Speech is fluent and clear.  He has mild vocal tremor.  Soft palate rises symmetrically and there is no tongue deviation. Hearing is intact to conversational tone. Tone: Tone is good throughout. Sensation: Sensation is intact to light touch throughout Coordination:  The patient has no  dysdiadichokinesia or dysmetria. Motor: Strength is 5/5 in the bilateral upper and lower extremities.  Shoulder shrug is equal bilaterally.  There is no pronator drift.  There are no fasciculations noted. Gait and Station: The patient ambulates in a wide-based fashion.  He has a mildly stooped posture.  He is mildly unstable.  MOVEMENT EXAM: Tremor: There is bilateral upper extremity postural tremor, left much more severe than right.  He initially has very little postural tremor on the right, but the longer he holds the hand out there the more severe it becomes.  When asked to draw Archimedes spirals, he cannot get the hand on the paper with either hand.  When asked to pour water, he spills water all over himself and the counter.     Assessment/Plan:   1.  Essential Tremor.  -Preop video was done today.  -I talked to the patient about the logistics associated with DBS therapy.  I talked to the patient about risks/benefits/side effects of DBS therapy.  We talked about risks which included but were not limited to infection, paralysis, intraoperative seizure, death, stroke, bleeding around the electrode.   I talked to patient about fiducial placement 1 week prior to DBS therapy, which is scheduled for April 26, 2017.  I talked to the patient about what to expect in the operating room, including the fact that this is an awake surgery.  We talked about battery placement as well as which is done under general anesthesia, generally approximately one week following the initial surgery.  We also talked about the fact that the patient will need to be off of medications for surgery.  The patient and family were given the opportunity to ask questions, which they did, and I answered them to the best of my ability today.  -Patient was given a weaning schedule for his primidone and gabapentin.  -Patient understands that there is a 30% risk of loss of balance with bilateral placement of DBS.  He understands this and  would like bilateral placement.  We will be starting with the R VIM.    2.  Patient will be seen for postop programming following surgery.  Greater than 50% of the 25-minute visit was spent in counseling with patient and his wife.   CC:  Freddrick MarchAmin, [name], MD

## 2017-04-19 NOTE — H&P (Signed)
Patient ID:   [id number] Patient: Brendan [name]  Date of Birth: 31-Jul-1940 Visit Type: Office Visit   Date: 01/31/2017 03:30 PM Provider: Danae Orleans. [name] MD   This 77 year old male presents for Tremor.   History of Present Illness: 1.  Tremor  [name], 77 year old retired male, visits on referral from Dr. Lurena Joiner Tat to discuss deep brain stimulator for Essential Tremor.  Patient reports 10+years increasing tremor, affecting head bilateral upper extremity, and feet. Tremor occurs in bilateral arms, left worse than right. Patient reports unable to feed himself. Patient has been unable to use a pen for 5 years. Patient presents with his wife and daughter.   Gabapentin 300 milligrams 1 b.i.d.-tid  History:  HTN, essential tremor, CAD, umbilical hernia Surgical history:  CABG 2004, left rotator cuff repair years ago, cataract bilaterally 2018  Cardiologist Dr. Herbie Baltimore, last seen September 28, 2016 Lasix 20 milligrams?  Taken daily p.o. ...  2+ pitting edema bilateral lower extremities           MEDICATIONS(added, continued or stopped this visit):   ALLERGIES:   Review of Systems System Neg/Pos Details  Constitutional Negative Chills, Fatigue, Fever, Malaise, Night sweats, Weight gain and Weight loss.  ENMT Negative Ear drainage, Hearing loss, Nasal drainage, Otalgia, Sinus pressure and Sore throat.  Eyes Negative Eye discharge, Eye pain and Vision changes.  Respiratory Negative Chronic cough, Cough, Dyspnea, Known TB exposure and Wheezing.  Cardio Positive Edema.  GI Negative Abdominal pain, Blood in stool, Change in stool pattern, Constipation, Decreased appetite, Diarrhea, Heartburn, Nausea and Vomiting.  GU Negative Dribbling, Dysuria, Erectile dysfunction, Hematuria, Polyuria (Genitourinary), Slow stream, Urinary frequency, Urinary incontinence and Urinary retention.  Endocrine Negative Cold intolerance, Heat intolerance, Polydipsia and Polyphagia.  Neuro Positive  Gait disturbance, Tremors.  Psych Negative Anxiety, Depression and Insomnia.  Integumentary Negative Brittle hair, Brittle nails, Change in shape/size of mole(s), Hair loss, Hirsutism, Hives, Pruritus, Rash and Skin lesion.  MS Negative Back pain, Joint pain, Joint swelling, Muscle weakness and Neck pain.  Hema/Lymph Negative Easy bleeding, Easy bruising and Lymphadenopathy.  Allergic/Immuno Negative Contact allergy, Environmental allergies, Food allergies and Seasonal allergies.  Reproductive Negative Penile discharge and Sexual dysfunction.   Vitals Date Temp F BP Pulse Ht In Wt Lb BMI BSA Pain Score  01/31/2017  176/82 58 66 205.8 33.22  0/10     PHYSICAL EXAM General Level of Distress: no acute distress Overall Appearance: normal  Head and Face  Right Left  Fundoscopic Exam:  normal normal    Cardiovascular Cardiac: regular rate and rhythm without murmur  Right Left  Carotid Pulses: normal normal  Respiratory Lungs: clear to auscultation  Neurological Orientation: normal Recent and Remote Memory: normal Attention Span and Concentration:   normal Language: normal Fund of Knowledge: normal  Right Left Sensation: normal normal Upper Extremity Coordination: normal normal  Lower Extremity Coordination: normal normal  Musculoskeletal Gait and Station: normal  Right Left Upper Extremity Muscle Strength: normal normal Lower Extremity Muscle Strength: normal normal Upper Extremity Muscle Tone:  normal normal Lower Extremity Muscle Tone: normal normal   Motor Strength Upper and lower extremity motor strength was tested in the clinically pertinent muscles.     Deep Tendon Reflexes  Right Left Biceps: normal normal Triceps: normal normal Brachioradialis: normal normal Patellar: normal normal Achilles: normal normal  Cranial Nerves II. Optic Nerve/Visual Fields: normal III. Oculomotor: normal IV. Trochlear: normal V. Trigeminal: normal VI.  Abducens: normal VII. Facial: normal VIII. Acoustic/Vestibular: normal IX.  Glossopharyngeal: normal X. Vagus: normal XI. Spinal Accessory: normal XII. Hypoglossal: normal  Motor and other Tests Lhermittes: negative Rhomberg: negative Pronator drift: absent     Right Left Hoffman's: normal normal Clonus: normal normal Babinski: normal normal   Additional Findings:  Difficulty holding a pen, unable to write while attempting to sign his name. Bilateral tremor, worse on left. Bilateral edema in the legs.    IMPRESSION Patient presents to discuss deep brain stimulator for tremor. On confrontational testing, bilateral tremor, left worse than right, and unable to write.  His quality of life is significantly impaired.  He is unable to feed himself.  He no longer goes out to eat.   I showed the patient and his family the deep brain stimulator hardware and discussed the procedure. [employer] DVD provided. Advised patient if he decides to proceed with deep brain stimulator, he will need cardiac clearance. Patient will watch the DVD and decide if he wants to proceed.      Pain Management Plan Pain Scale: 0/10. Method: Numeric Pain Intensity Scale.  Patient will call Dr. Arbutus Leasat with his decision and follow up with me as needed after that..              Provider:  Venetia MaxonStern MD, Danae OrleansJoseph D 01/31/2017 4:16 PM  Dictation edited by: Lajoyce LauberLisa Hoyt    CC Providers: Freddrick MarchYashika  [facility] [address] [place],  [place]  [id number]-   Lurena JoinerRebecca Tat  [address] [place], [place] [id number]              Electronically signed by Danae OrleansJoseph D. Venetia MaxonStern MD on 02/04/2017 01:56 PM

## 2017-04-19 NOTE — Telephone Encounter (Signed)
Patient's wife called back. She said the pharmacy had one of the medications but not the other. She is unsure how to decrease the amount since all she has is the capsule? Please Call. Thanks

## 2017-04-19 NOTE — Telephone Encounter (Signed)
Went over titration with patient's wife again.

## 2017-04-19 NOTE — Telephone Encounter (Signed)
Spoke with patient's wife and made her aware we need to make a follow up appt for patient to be seen preoperatively prior to DBS surgery on 05/04/17. They could not come in on 04/25/17. We will call with another appt.  We will also need to wean Primidone and Gabapentin. I verbally gave patient's wife the weaning schedule, but also sent in an RX from Primidone to the pharmacy with his weaning schedule. It is as follows: 04/19/17: Primidone 250 mg am, 200 mg hs 04/20/17: 200 mg am, 200 mg hs 04/21/17: 200 mg am, 150 mg hs 1/6-1/7: 150 mg BID 1/8-1/9: 150 mg am, 100 mg hs 1/10-1/11: 100 mg BID 1/12-1/13: 100 mg am, 50 mg hs 1/14: 50 mg BID 1/15: 50 mg QD 1/16: Stop  He is currently taking Gabapentin 600 mg BID. He will drop this by one tablet every 3-4 days prior to surgery. They just picked up 300 mg tablets and wanted to use these. He will take as follows:  1/3-1/6: Gabapentin 600 mg am, 300 mg hs 1/7-1/10: Gabapentin 300 mg BID 1/11-1/15: Gabapentin 300 mg daily 05/02/17: stop Gabapentin  Patient will have surgery on 05/04/2017.

## 2017-04-20 ENCOUNTER — Ambulatory Visit: Payer: PPO | Admitting: Neurology

## 2017-04-20 ENCOUNTER — Encounter: Payer: Self-pay | Admitting: Neurology

## 2017-04-20 VITALS — BP 140/68 | HR 54 | Ht 66.0 in | Wt 202.0 lb

## 2017-04-20 DIAGNOSIS — Z01818 Encounter for other preprocedural examination: Secondary | ICD-10-CM

## 2017-04-20 DIAGNOSIS — G250 Essential tremor: Secondary | ICD-10-CM | POA: Diagnosis not present

## 2017-04-20 NOTE — Patient Instructions (Signed)
Schedule for weaning medications:  PRIMIDONE 04/20/17: 200 mg am, 200 mg in the evening 04/21/17: 200 mg am, 150 mg in the evening 1/6-1/7: 150 mg twice daily 1/8-1/9: 150 mg am, 100 mg in the evening 1/10-1/11: 100 mg twice daily 1/12-1/13: 100 mg am, 50 mg in the evening 1/14: 50 mg twice daily 1/15: 50 mg in the morning 1/16: Stop Primidone  Gabapentin 1/3-1/6: Gabapentin 600 mg am, 300 mg in the evening 1/7-1/10: Gabapentin 300 mg twice daily 1/11-1/15: Gabapentin 300 mg daily 05/02/17: stop Gabapentin

## 2017-04-24 NOTE — Pre-Procedure Instructions (Signed)
[name] W Scholler  04/24/2017      [facility] - [place] - [address] [address] [address] [address] [address] [place] [id number] Phone: [phone] Fax: [phone]    Your procedure is scheduled on Thursday, April 26, 2017  [facility]A" at 7:30AM   Call this number if you have problems the morning of surgery:  [phone]   Remember:  Do not eat food or drink liquids after midnight.  Take these medicines the morning of surgery with A SIP OF WATER: Gabapentin (NEURONTIN), Loratadine (CLARITIN), Propranolol (INDERAL), and Primidone (MYSOLINE). If needed Omeprazole (PRILOSEC) for acid reflux, (SYSTANE BALANCE) eye drops, and NitroGLYCERIN (NITROSTAT) for chest pain (Notify the nurse if you had to take this medicine)  Follow your doctor's instruction regarding Aspirin.  As of today, stop taking all Aspirins, Vitamins, Fish oils, and Herbal medications. Also stop all NSAIDS i.e. Advil, Ibuprofen, Motrin, Aleve, Anaprox, Naproxen, BC and Goody Powders.   Do not wear jewelry.  Do not wear lotions, powders,colognes, or deodorant.  Do not shave 48 hours prior to surgery.  Men may shave face and neck.  Do not bring valuables to the hospital.  [facility] is not responsible for any belongings or valuables.  Contacts, dentures or bridgework may not be worn into surgery.  Leave your suitcase in the car.  After surgery it may be brought to your room.  For patients admitted to the hospital, discharge time will be determined by your treatment team.  Patients discharged the day of surgery will not be allowed to drive home.   Special instructions:   [place]- Preparing For Surgery  Before surgery, you can play an important role. Because skin is not sterile, your skin needs to be as free of germs as possible. You can reduce the number of germs on your skin by washing with CHG (chlorahexidine gluconate) Soap  before surgery.  CHG is an antiseptic cleaner which kills germs and bonds with the skin to continue killing germs even after washing.  Please do not use if you have an allergy to CHG or antibacterial soaps. If your skin becomes reddened/irritated stop using the CHG.  Do not shave (including legs and underarms) for at least 48 hours prior to first CHG shower. It is OK to shave your face.  Please follow these instructions carefully.   1. Shower the NIGHT BEFORE SURGERY and the MORNING OF SURGERY with CHG.   2. If you chose to wash your hair, wash your hair first as usual with your normal shampoo.  3. After you shampoo, rinse your hair and body thoroughly to remove the shampoo.  4. Use CHG as you would any other liquid soap. You can apply CHG directly to the skin and wash gently with a scrungie or a clean washcloth.   5. Apply the CHG Soap to your body ONLY FROM THE NECK DOWN.  Do not use on open wounds or open sores. Avoid contact with your eyes, ears, mouth and genitals (private parts). Wash Face and genitals (private parts)  with your normal soap.  6. Wash thoroughly, paying special attention to the area where your surgery will be performed.  7. Thoroughly rinse your body with warm water from the neck down.  8. DO NOT shower/wash with your normal soap after using and rinsing off the CHG Soap.  9. Pat yourself dry with a CLEAN TOWEL.  10. Wear CLEAN PAJAMAS to  bed the night before surgery, wear comfortable clothes the morning of surgery  11. Place CLEAN SHEETS on your bed the night of your first shower and DO NOT SLEEP WITH PETS.  Day of Surgery: Do not apply any deodorants/lotions. Please wear clean clothes to the hospital/surgery center.    Please read over the following fact sheets that you were given. Pain Booklet, Coughing and Deep Breathing and Surgical Site Infection Prevention

## 2017-04-25 ENCOUNTER — Other Ambulatory Visit: Payer: Self-pay

## 2017-04-25 ENCOUNTER — Encounter (HOSPITAL_COMMUNITY)
Admission: RE | Admit: 2017-04-25 | Discharge: 2017-04-25 | Disposition: A | Discharge status: Home / Self Care | Payer: PPO | Source: Ambulatory Visit | Attending: Neurosurgery | Admitting: Neurosurgery

## 2017-04-25 ENCOUNTER — Encounter (HOSPITAL_COMMUNITY): Payer: Self-pay

## 2017-04-25 DIAGNOSIS — G250 Essential tremor: Secondary | ICD-10-CM | POA: Diagnosis not present

## 2017-04-25 DIAGNOSIS — I2510 Atherosclerotic heart disease of native coronary artery without angina pectoris: Secondary | ICD-10-CM | POA: Diagnosis not present

## 2017-04-25 DIAGNOSIS — I10 Essential (primary) hypertension: Secondary | ICD-10-CM | POA: Diagnosis not present

## 2017-04-25 DIAGNOSIS — Z951 Presence of aortocoronary bypass graft: Secondary | ICD-10-CM | POA: Diagnosis not present

## 2017-04-25 LAB — BASIC METABOLIC PANEL
Anion gap: 5 (ref 5–15)
BUN: 9 mg/dL (ref 6–20)
CO2: 30 mmol/L (ref 22–32)
Calcium: 8.9 mg/dL (ref 8.9–10.3)
Chloride: 104 mmol/L (ref 101–111)
Creatinine, Ser: 1.08 mg/dL (ref 0.61–1.24)
GFR calc Af Amer: >60 mL/min (ref >60)
GFR calc non Af Amer: >60 mL/min (ref >60)
Glucose, Bld: 105 mg/dL — ABNORMAL HIGH (ref 65–99)
Potassium: 4.1 mmol/L (ref 3.5–5.1)
Sodium: 139 mmol/L (ref 135–145)

## 2017-04-25 LAB — TYPE AND SCREEN
ABO/RH(D): B POS
Antibody Screen: NEGATIVE

## 2017-04-25 LAB — CBC
HCT: 40.4 % (ref 39.0–52.0)
Hemoglobin: 13.4 g/dL (ref 13.0–17.0)
MCH: 32.7 pg (ref 26.0–34.0)
MCHC: 33.2 g/dL (ref 30.0–36.0)
MCV: 98.5 fL (ref 78.0–100.0)
Platelets: 134 10*3/uL — ABNORMAL LOW (ref 150–400)
RBC: 4.1 MIL/uL — ABNORMAL LOW (ref 4.22–5.81)
RDW: 13.4 % (ref 11.5–15.5)
WBC: 5.3 10*3/uL (ref 4.0–10.5)

## 2017-04-25 LAB — ABO/RH: ABO/RH(D): B POS

## 2017-04-25 NOTE — Progress Notes (Addendum)
PCP -  Dr. Homero FellersAmein  Cardiologist - Dr. Bryan Lemmaavid Harding  Chest x-ray - Denies  EKG - 09/28/16 (E)  Stress Test - 09/28/15 (E)  ECHO - 08/13/12 (E)  Cardiac Cath - 2004  Sleep Study - Yes- Negative CPAP - None  LABS- 04/25/17: CBC, BMP, T/S  Pt sts he stopped Aspirin 2 weeks ago, per his doctor.   Anesthesia- Yes- Cardiac history  Pt denies having chest pain, sob, or fever at this time. All instructions explained to the pt, with a verbal understanding of the material. Pt agrees to go over the instructions while at home for a better understanding. The opportunity to ask questions was provided.

## 2017-04-26 ENCOUNTER — Ambulatory Visit (HOSPITAL_COMMUNITY)
Admission: RE | Admit: 2017-04-26 | Discharge: 2017-04-26 | Disposition: A | Discharge status: Home / Self Care | Payer: PPO | Source: Ambulatory Visit | Attending: Neurosurgery | Admitting: Neurosurgery

## 2017-04-26 ENCOUNTER — Encounter (HOSPITAL_COMMUNITY)
Admission: RE | Disposition: A | Discharge status: Home / Self Care | Payer: Self-pay | Source: Ambulatory Visit | Attending: Neurosurgery

## 2017-04-26 DIAGNOSIS — Z951 Presence of aortocoronary bypass graft: Secondary | ICD-10-CM | POA: Insufficient documentation

## 2017-04-26 DIAGNOSIS — G250 Essential tremor: Secondary | ICD-10-CM | POA: Insufficient documentation

## 2017-04-26 DIAGNOSIS — G252 Other specified forms of tremor: Secondary | ICD-10-CM | POA: Diagnosis not present

## 2017-04-26 DIAGNOSIS — I10 Essential (primary) hypertension: Secondary | ICD-10-CM | POA: Insufficient documentation

## 2017-04-26 DIAGNOSIS — I2510 Atherosclerotic heart disease of native coronary artery without angina pectoris: Secondary | ICD-10-CM | POA: Insufficient documentation

## 2017-04-26 HISTORY — PX: MINOR PLACEMENT OF FIDUCIAL: SHX6748

## 2017-04-26 SURGERY — MINOR PLACEMENT OF FIDUCIAL
Anesthesia: LOCAL

## 2017-04-26 MED ORDER — LIDOCAINE-EPINEPHRINE 1 %-1:100000 IJ SOLN
INTRAMUSCULAR | Status: AC
  Filled 2017-04-26: qty 1

## 2017-04-26 MED ORDER — BACITRACIN-NEOMYCIN-POLYMYXIN 400-5-5000 EX OINT
TOPICAL_OINTMENT | CUTANEOUS | Status: AC
  Filled 2017-04-26: qty 1

## 2017-04-26 MED ORDER — SODIUM BICARBONATE 4 % IV SOLN
INTRAVENOUS | Status: AC
  Filled 2017-04-26: qty 5

## 2017-04-26 SURGICAL SUPPLY — 18 items
BANDAGE ADH SHEER 1  50/CT (GAUZE/BANDAGES/DRESSINGS) ×10 IMPLANT
BLADE CLIPPER SURG (BLADE) ×2 IMPLANT
BLADE SURG 15 STRL LF DISP TIS (BLADE) ×1 IMPLANT
BLADE SURG 15 STRL SS (BLADE) ×2
COVER BACK TABLE 60X90IN (DRAPES) ×2 IMPLANT
DRAPE HALF SHEET 40X57 (DRAPES) ×2 IMPLANT
DRAPE SHEET LG 3/4 BI-LAMINATE (DRAPES) ×2 IMPLANT
GLOVE BIO SURGEON STRL SZ8 (GLOVE) ×2 IMPLANT
GLOVE ECLIPSE 8.5 STRL (GLOVE) ×2 IMPLANT
NDL HYPO 18GX1.5 BLUNT FILL (NEEDLE) ×1 IMPLANT
NDL HYPO 25X1 1.5 SAFETY (NEEDLE) ×1 IMPLANT
NEEDLE HYPO 18GX1.5 BLUNT FILL (NEEDLE) ×2 IMPLANT
NEEDLE HYPO 25X1 1.5 SAFETY (NEEDLE) ×2 IMPLANT
SOL PREP POV-IOD 4OZ 10% (MISCELLANEOUS) ×2 IMPLANT
SPONGE GAUZE 4X4 12PLY STER LF (GAUZE/BANDAGES/DRESSINGS) ×2 IMPLANT
STAPLER SKIN PROX WIDE 3.9 (STAPLE) ×2 IMPLANT
SYR CONTROL 10ML LL (SYRINGE) ×2 IMPLANT
TOWEL GREEN STERILE (TOWEL DISPOSABLE) ×2 IMPLANT

## 2017-04-26 NOTE — Interval H&P Note (Signed)
History and Physical Interval Note:  04/26/2017 9:11 AM  Brendan [name]  has presented today for surgery, with the diagnosis of Essential Tremor  The various methods of treatment have been discussed with the patient and family. After consideration of risks, benefits and other options for treatment, the patient has consented to  Procedure(s) with comments: Fiducial placement (N/A) - Fiducial placement as a surgical intervention .  The patient's history has been reviewed, patient examined, no change in status, stable for surgery.  I have reviewed the patient's chart and labs.  Questions were answered to the patient's satisfaction.     [name]

## 2017-04-26 NOTE — Op Note (Signed)
04/26/2017  12:47 PM  PATIENT:  Brendan [name]  76 y.o. male  PRE-OPERATIVE DIAGNOSIS:  Essential Tremor  POST-OPERATIVE DIAGNOSIS: Same  PROCEDURE:  Procedure(s) with comments: Fiducial placement (N/A) - Fiducial placement  SURGEON:  Surgeon(s) and Role:    * Westley Blass, MD - Primary  PHYSICIAN ASSISTANT:   ASSISTANTS: Poteat, RN   ANESTHESIA:   local  EBL:  None   BLOOD ADMINISTERED:none  DRAINS: none   LOCAL MEDICATIONS USED:  MARCAINE    and LIDOCAINE   SPECIMEN:  No Specimen  DISPOSITION OF SPECIMEN:  N/A  COUNTS:  YES  TOURNIQUET:  * No tourniquets in log *  DICTATION:Indications:  Patient has essential tremor and presents for Star Fix Fiducial Placement for upcoming DBS VIM placement.    Procedure:  Patient was brought to the operating room.  His scalp had been shaved.  Areas of planned fiducial placement were marked, scalp was prepped with betadine.  Scalp was infiltrated with lidocaine with epinephrine.  Four fiducials were placed according to standard landmarks through stab incisions.  3-0 Nylon sutures were placed and sterile dressings were applied.  Patient tolerated procedure well.  He was taken to recovery.  PLAN OF CARE: Outpatient  PATIENT DISPOSITION:  Home   Delay start of Pharmacological VTE agent (>24hrs) due to surgical blood loss or risk of bleeding: not applicable  

## 2017-04-26 NOTE — Brief Op Note (Signed)
04/26/2017  12:47 PM  PATIENT:  Brendan [name]  77 y.o. male  PRE-OPERATIVE DIAGNOSIS:  Essential Tremor  POST-OPERATIVE DIAGNOSIS: Same  PROCEDURE:  Procedure(s) with comments: Fiducial placement (N/A) - Fiducial placement  SURGEON:  Surgeon(s) and Role:    Maeola Harman* Manolo Bosket, MD - Primary  PHYSICIAN ASSISTANT:   ASSISTANTS: Poteat, RN   ANESTHESIA:   local  EBL:  None   BLOOD ADMINISTERED:none  DRAINS: none   LOCAL MEDICATIONS USED:  MARCAINE    and LIDOCAINE   SPECIMEN:  No Specimen  DISPOSITION OF SPECIMEN:  N/A  COUNTS:  YES  TOURNIQUET:  * No tourniquets in log *  DICTATION:Indications:  Patient has essential tremor and presents for Star Fix Fiducial Placement for upcoming DBS VIM placement.    Procedure:  Patient was brought to the operating room.  His scalp had been shaved.  Areas of planned fiducial placement were marked, scalp was prepped with betadine.  Scalp was infiltrated with lidocaine with epinephrine.  Four fiducials were placed according to standard landmarks through stab incisions.  3-0 Nylon sutures were placed and sterile dressings were applied.  Patient tolerated procedure well.  He was taken to recovery.  PLAN OF CARE: Outpatient  PATIENT DISPOSITION:  Home   Delay start of Pharmacological VTE agent (>24hrs) due to surgical blood loss or risk of bleeding: not applicable

## 2017-04-30 ENCOUNTER — Encounter (HOSPITAL_COMMUNITY): Payer: Self-pay | Admitting: Neurosurgery

## 2017-05-03 ENCOUNTER — Encounter (HOSPITAL_COMMUNITY): Payer: Self-pay | Admitting: *Deleted

## 2017-05-03 ENCOUNTER — Other Ambulatory Visit: Payer: Self-pay

## 2017-05-03 NOTE — Anesthesia Preprocedure Evaluation (Signed)
Anesthesia Evaluation  Patient identified by MRN, date of birth, ID band Patient awake    Reviewed: Allergy & Precautions, NPO status , Patient's Chart, lab work & pertinent test results, reviewed documented beta blocker date and time   Airway Mallampati: II  TM Distance: >3 FB Neck ROM: Full    Dental no notable dental hx.    Pulmonary neg pulmonary ROS, former smoker,    Pulmonary exam normal breath sounds clear to auscultation       Cardiovascular Exercise Tolerance: Poor hypertension, Pt. on medications and Pt. on home beta blockers + angina + CAD and + CABG  Normal cardiovascular exam Rhythm:Regular Rate:Normal  Stress 09/2015  The left ventricular ejection fraction is normal (55-65%). No wall motion abnormalities.  Nuclear stress EF: 59%.  There was no ST segment deviation noted during stress.  The study is normal. There is no ischemia identified. Normal perfusion.  This is a low risk study.   Neuro/Psych PSYCHIATRIC DISORDERS Anxiety  Neuromuscular disease negative psych ROS   GI/Hepatic Neg liver ROS, hiatal hernia, GERD  ,  Endo/Other  negative endocrine ROS  Renal/GU negative Renal ROS     Musculoskeletal negative musculoskeletal ROS (+)   Abdominal   Peds  Hematology negative hematology ROS (+) anemia ,   Anesthesia Other Findings   Reproductive/Obstetrics negative OB ROS                            Anesthesia Physical Anesthesia Plan  ASA: III  Anesthesia Plan:    Post-op Pain Management:    Induction:   PONV Risk Score and Plan: Treatment may vary due to age or medical condition  Airway Management Planned:   Additional Equipment:   Intra-op Plan:   Post-operative Plan:   Informed Consent: I have reviewed the patients History and Physical, chart, labs and discussed the procedure including the risks, benefits and alternatives for the proposed anesthesia with  the patient or authorized representative who has indicated his/her understanding and acceptance.   Dental advisory given  Plan Discussed with: CRNA  Anesthesia Plan Comments:         Anesthesia Quick Evaluation

## 2017-05-04 ENCOUNTER — Inpatient Hospital Stay (HOSPITAL_COMMUNITY): Payer: PPO | Admitting: Anesthesiology

## 2017-05-04 ENCOUNTER — Encounter (HOSPITAL_COMMUNITY): Payer: Self-pay | Admitting: General Practice

## 2017-05-04 ENCOUNTER — Inpatient Hospital Stay (HOSPITAL_COMMUNITY)
Admission: RE | Disposition: A | Discharge status: Skilled Nursing Facility | Payer: Self-pay | Source: Ambulatory Visit | Attending: Neurosurgery

## 2017-05-04 ENCOUNTER — Inpatient Hospital Stay (HOSPITAL_COMMUNITY)
Admission: RE | Admit: 2017-05-04 | Discharge: 2017-05-11 | DRG: 025 | Disposition: A | Discharge status: Skilled Nursing Facility | Payer: PPO | Source: Ambulatory Visit | Attending: Neurosurgery | Admitting: Neurosurgery

## 2017-05-04 ENCOUNTER — Inpatient Hospital Stay (HOSPITAL_COMMUNITY): Payer: PPO

## 2017-05-04 DIAGNOSIS — I208 Other forms of angina pectoris: Secondary | ICD-10-CM

## 2017-05-04 DIAGNOSIS — G250 Essential tremor: Principal | ICD-10-CM | POA: Diagnosis present

## 2017-05-04 DIAGNOSIS — R5381 Other malaise: Secondary | ICD-10-CM | POA: Diagnosis not present

## 2017-05-04 DIAGNOSIS — I25709 Atherosclerosis of coronary artery bypass graft(s), unspecified, with unspecified angina pectoris: Secondary | ICD-10-CM

## 2017-05-04 DIAGNOSIS — K219 Gastro-esophageal reflux disease without esophagitis: Secondary | ICD-10-CM | POA: Diagnosis not present

## 2017-05-04 DIAGNOSIS — G629 Polyneuropathy, unspecified: Secondary | ICD-10-CM | POA: Diagnosis present

## 2017-05-04 DIAGNOSIS — R338 Other retention of urine: Secondary | ICD-10-CM | POA: Diagnosis not present

## 2017-05-04 DIAGNOSIS — Z01810 Encounter for preprocedural cardiovascular examination: Secondary | ICD-10-CM

## 2017-05-04 DIAGNOSIS — F09 Unspecified mental disorder due to known physiological condition: Secondary | ICD-10-CM

## 2017-05-04 DIAGNOSIS — I25118 Atherosclerotic heart disease of native coronary artery with other forms of angina pectoris: Secondary | ICD-10-CM | POA: Diagnosis not present

## 2017-05-04 DIAGNOSIS — I2510 Atherosclerotic heart disease of native coronary artery without angina pectoris: Secondary | ICD-10-CM | POA: Diagnosis not present

## 2017-05-04 DIAGNOSIS — R5383 Other fatigue: Secondary | ICD-10-CM | POA: Diagnosis not present

## 2017-05-04 DIAGNOSIS — E785 Hyperlipidemia, unspecified: Secondary | ICD-10-CM | POA: Diagnosis not present

## 2017-05-04 DIAGNOSIS — I10 Essential (primary) hypertension: Secondary | ICD-10-CM | POA: Diagnosis present

## 2017-05-04 DIAGNOSIS — Z87891 Personal history of nicotine dependence: Secondary | ICD-10-CM

## 2017-05-04 DIAGNOSIS — L8990 Pressure ulcer of unspecified site, unspecified stage: Secondary | ICD-10-CM

## 2017-05-04 DIAGNOSIS — I619 Nontraumatic intracerebral hemorrhage, unspecified: Secondary | ICD-10-CM | POA: Diagnosis not present

## 2017-05-04 DIAGNOSIS — R41841 Cognitive communication deficit: Secondary | ICD-10-CM | POA: Diagnosis not present

## 2017-05-04 DIAGNOSIS — I25119 Atherosclerotic heart disease of native coronary artery with unspecified angina pectoris: Secondary | ICD-10-CM

## 2017-05-04 DIAGNOSIS — F411 Generalized anxiety disorder: Secondary | ICD-10-CM | POA: Diagnosis not present

## 2017-05-04 DIAGNOSIS — Z969 Presence of functional implant, unspecified: Secondary | ICD-10-CM | POA: Diagnosis not present

## 2017-05-04 DIAGNOSIS — R339 Retention of urine, unspecified: Secondary | ICD-10-CM | POA: Diagnosis not present

## 2017-05-04 DIAGNOSIS — S06350D Traumatic hemorrhage of left cerebrum without loss of consciousness, subsequent encounter: Secondary | ICD-10-CM | POA: Diagnosis not present

## 2017-05-04 DIAGNOSIS — Z23 Encounter for immunization: Secondary | ICD-10-CM | POA: Diagnosis not present

## 2017-05-04 DIAGNOSIS — M6281 Muscle weakness (generalized): Secondary | ICD-10-CM | POA: Diagnosis not present

## 2017-05-04 DIAGNOSIS — R2681 Unsteadiness on feet: Secondary | ICD-10-CM | POA: Diagnosis not present

## 2017-05-04 DIAGNOSIS — S06350A Traumatic hemorrhage of left cerebrum without loss of consciousness, initial encounter: Secondary | ICD-10-CM

## 2017-05-04 DIAGNOSIS — N401 Enlarged prostate with lower urinary tract symptoms: Secondary | ICD-10-CM | POA: Diagnosis present

## 2017-05-04 DIAGNOSIS — G9389 Other specified disorders of brain: Secondary | ICD-10-CM | POA: Diagnosis present

## 2017-05-04 DIAGNOSIS — R4701 Aphasia: Secondary | ICD-10-CM | POA: Diagnosis not present

## 2017-05-04 DIAGNOSIS — Z7982 Long term (current) use of aspirin: Secondary | ICD-10-CM

## 2017-05-04 DIAGNOSIS — H409 Unspecified glaucoma: Secondary | ICD-10-CM

## 2017-05-04 DIAGNOSIS — Z01818 Encounter for other preprocedural examination: Secondary | ICD-10-CM | POA: Diagnosis not present

## 2017-05-04 DIAGNOSIS — I25810 Atherosclerosis of coronary artery bypass graft(s) without angina pectoris: Secondary | ICD-10-CM | POA: Diagnosis not present

## 2017-05-04 DIAGNOSIS — R1311 Dysphagia, oral phase: Secondary | ICD-10-CM | POA: Diagnosis not present

## 2017-05-04 DIAGNOSIS — R4182 Altered mental status, unspecified: Secondary | ICD-10-CM | POA: Diagnosis not present

## 2017-05-04 DIAGNOSIS — M6389 Disorders of muscle in diseases classified elsewhere, multiple sites: Secondary | ICD-10-CM | POA: Diagnosis not present

## 2017-05-04 HISTORY — PX: SUBTHALAMIC STIMULATOR INSERTION: SHX5375

## 2017-05-04 SURGERY — SUBTHALAMIC STIMULATOR INSERTION
Anesthesia: Monitor Anesthesia Care | Laterality: Bilateral

## 2017-05-04 MED ORDER — FENTANYL CITRATE (PF) 250 MCG/5ML IJ SOLN
INTRAMUSCULAR | Status: AC
  Filled 2017-05-04: qty 5

## 2017-05-04 MED ORDER — BACITRACIN-NEOMYCIN-POLYMYXIN 400-5-5000 EX OINT: 1.0000 "application " | TOPICAL_OINTMENT | CUTANEOUS | Status: DC | PRN

## 2017-05-04 MED ORDER — BACITRACIN ZINC 500 UNIT/GM EX OINT
TOPICAL_OINTMENT | CUTANEOUS | Status: AC
  Filled 2017-05-04: qty 28.35

## 2017-05-04 MED ORDER — CEFAZOLIN SODIUM-DEXTROSE 2-4 GM/100ML-% IV SOLN
2.0000 g | Freq: Three times a day (TID) | INTRAVENOUS | Status: AC
  Administered 2017-05-04 – 2017-05-05 (×2): 2 g via INTRAVENOUS
  Filled 2017-05-04 (×2): qty 100

## 2017-05-04 MED ORDER — ZOLPIDEM TARTRATE 5 MG PO TABS
5.0000 mg | ORAL_TABLET | Freq: Every evening | ORAL | Status: DC | PRN
  Administered 2017-05-04: 5 mg via ORAL
  Filled 2017-05-04 (×2): qty 1

## 2017-05-04 MED ORDER — MENTHOL 3 MG MT LOZG: 1.0000 | LOZENGE | OROMUCOSAL | Status: DC | PRN

## 2017-05-04 MED ORDER — ASPIRIN 81 MG PO TBEC: 81.0000 mg | DELAYED_RELEASE_TABLET | Freq: Every day | ORAL | Status: DC

## 2017-05-04 MED ORDER — ACETAMINOPHEN 325 MG PO TABS
650.0000 mg | ORAL_TABLET | ORAL | Status: DC | PRN
  Administered 2017-05-09: 650 mg via ORAL
  Filled 2017-05-04 (×2): qty 2

## 2017-05-04 MED ORDER — SODIUM BICARBONATE 4 % IV SOLN
INTRAVENOUS | Status: AC
  Filled 2017-05-04: qty 5

## 2017-05-04 MED ORDER — PROPOFOL 500 MG/50ML IV EMUL
INTRAVENOUS | Status: DC | PRN
  Administered 2017-05-04: 50 ug/kg/min via INTRAVENOUS

## 2017-05-04 MED ORDER — LIDOCAINE 2% (20 MG/ML) 5 ML SYRINGE
INTRAMUSCULAR | Status: AC
  Filled 2017-05-04: qty 5

## 2017-05-04 MED ORDER — LACTATED RINGERS IV SOLN
INTRAVENOUS | Status: DC | PRN
  Administered 2017-05-04: 07:00:00 via INTRAVENOUS

## 2017-05-04 MED ORDER — EPHEDRINE SULFATE 50 MG/ML IJ SOLN
INTRAMUSCULAR | Status: DC | PRN
  Administered 2017-05-04: 5 mg via INTRAVENOUS

## 2017-05-04 MED ORDER — PHENOL 1.4 % MT LIQD: 1.0000 | OROMUCOSAL | Status: DC | PRN

## 2017-05-04 MED ORDER — BUPIVACAINE HCL (PF) 0.5 % IJ SOLN
INTRAMUSCULAR | Status: AC
  Filled 2017-05-04: qty 60

## 2017-05-04 MED ORDER — HYDROCODONE-ACETAMINOPHEN 5-325 MG PO TABS
2.0000 | ORAL_TABLET | ORAL | Status: DC | PRN
  Administered 2017-05-04: 2 via ORAL
  Administered 2017-05-05: 1 via ORAL
  Administered 2017-05-10: 2 via ORAL
  Filled 2017-05-04 (×3): qty 2

## 2017-05-04 MED ORDER — CHLORHEXIDINE GLUCONATE CLOTH 2 % EX PADS: 6.0000 | MEDICATED_PAD | Freq: Once | CUTANEOUS | Status: DC

## 2017-05-04 MED ORDER — ROCURONIUM BROMIDE 10 MG/ML (PF) SYRINGE
PREFILLED_SYRINGE | INTRAVENOUS | Status: AC
  Filled 2017-05-04: qty 5

## 2017-05-04 MED ORDER — DOCUSATE SODIUM 100 MG PO CAPS
100.0000 mg | ORAL_CAPSULE | Freq: Two times a day (BID) | ORAL | Status: DC
  Administered 2017-05-07 – 2017-05-11 (×8): 100 mg via ORAL
  Filled 2017-05-04 (×13): qty 1

## 2017-05-04 MED ORDER — LIDOCAINE-EPINEPHRINE 1 %-1:100000 IJ SOLN
INTRAMUSCULAR | Status: AC
  Filled 2017-05-04: qty 1

## 2017-05-04 MED ORDER — HYDROCODONE-ACETAMINOPHEN 5-325 MG PO TABS
1.0000 | ORAL_TABLET | Freq: Four times a day (QID) | ORAL | Status: DC | PRN
Start: 2017-05-04 — End: 2017-05-04

## 2017-05-04 MED ORDER — 0.9 % SODIUM CHLORIDE (POUR BTL) OPTIME
TOPICAL | Status: DC | PRN
  Administered 2017-05-04: 1000 mL

## 2017-05-04 MED ORDER — NITROGLYCERIN 0.4 MG SL SUBL: 0.4000 mg | SUBLINGUAL_TABLET | SUBLINGUAL | Status: DC | PRN

## 2017-05-04 MED ORDER — LIDOCAINE-EPINEPHRINE 1 %-1:100000 IJ SOLN
INTRAMUSCULAR | Status: AC
  Filled 2017-05-04: qty 3

## 2017-05-04 MED ORDER — ONDANSETRON HCL 4 MG PO TABS: 4.0000 mg | ORAL_TABLET | Freq: Four times a day (QID) | ORAL | Status: DC | PRN

## 2017-05-04 MED ORDER — PHENYLEPHRINE 40 MCG/ML (10ML) SYRINGE FOR IV PUSH (FOR BLOOD PRESSURE SUPPORT)
PREFILLED_SYRINGE | INTRAVENOUS | Status: AC
  Filled 2017-05-04: qty 10

## 2017-05-04 MED ORDER — MEPERIDINE HCL 25 MG/ML IJ SOLN: 6.2500 mg | INTRAMUSCULAR | Status: DC | PRN

## 2017-05-04 MED ORDER — CEFAZOLIN SODIUM-DEXTROSE 2-4 GM/100ML-% IV SOLN
2.0000 g | INTRAVENOUS | Status: AC
  Administered 2017-05-04: 2 g via INTRAVENOUS

## 2017-05-04 MED ORDER — SUGAMMADEX SODIUM 200 MG/2ML IV SOLN
INTRAVENOUS | Status: AC
  Filled 2017-05-04: qty 2

## 2017-05-04 MED ORDER — ATORVASTATIN CALCIUM 40 MG PO TABS
40.0000 mg | ORAL_TABLET | Freq: Every day | ORAL | Status: DC
Start: 2017-05-04 — End: 2017-05-11
  Administered 2017-05-04 – 2017-05-10 (×5): 40 mg via ORAL
  Filled 2017-05-04 (×2): qty 1
  Filled 2017-05-04: qty 2
  Filled 2017-05-04 (×3): qty 1

## 2017-05-04 MED ORDER — SUCCINYLCHOLINE CHLORIDE 200 MG/10ML IV SOSY
PREFILLED_SYRINGE | INTRAVENOUS | Status: AC
  Filled 2017-05-04: qty 10

## 2017-05-04 MED ORDER — BUPIVACAINE HCL 0.5 % IJ SOLN
INTRAMUSCULAR | Status: DC | PRN
  Administered 2017-05-04: 17 mL

## 2017-05-04 MED ORDER — THROMBIN (RECOMBINANT) 5000 UNITS EX SOLR
CUTANEOUS | Status: DC | PRN
  Administered 2017-05-04: 09:00:00 via TOPICAL

## 2017-05-04 MED ORDER — DEXAMETHASONE SODIUM PHOSPHATE 10 MG/ML IJ SOLN
INTRAMUSCULAR | Status: AC
  Filled 2017-05-04: qty 1

## 2017-05-04 MED ORDER — MORPHINE SULFATE (PF) 4 MG/ML IV SOLN: 1.0000 mg | INTRAVENOUS | Status: DC | PRN

## 2017-05-04 MED ORDER — POLYVINYL ALCOHOL 1.4 % OP SOLN
1.0000 [drp] | OPHTHALMIC | Status: DC | PRN
  Filled 2017-05-04: qty 15

## 2017-05-04 MED ORDER — LOSARTAN POTASSIUM 50 MG PO TABS
100.0000 mg | ORAL_TABLET | Freq: Every day | ORAL | Status: DC
  Administered 2017-05-07 – 2017-05-11 (×5): 100 mg via ORAL
  Filled 2017-05-04 (×7): qty 2

## 2017-05-04 MED ORDER — PANTOPRAZOLE SODIUM 40 MG PO TBEC
40.0000 mg | DELAYED_RELEASE_TABLET | Freq: Every day | ORAL | Status: DC
  Administered 2017-05-07 – 2017-05-11 (×5): 40 mg via ORAL
  Filled 2017-05-04 (×7): qty 1

## 2017-05-04 MED ORDER — LIDOCAINE-EPINEPHRINE 1 %-1:100000 IJ SOLN
INTRAMUSCULAR | Status: DC | PRN
  Administered 2017-05-04: 17 mL

## 2017-05-04 MED ORDER — PROPYLENE GLYCOL 0.6 % OP SOLN: 1.0000 [drp] | Freq: Every day | OPHTHALMIC | Status: DC | PRN

## 2017-05-04 MED ORDER — SODIUM CHLORIDE 0.9% FLUSH: 3.0000 mL | INTRAVENOUS | Status: DC | PRN

## 2017-05-04 MED ORDER — SODIUM BICARBONATE 4 % IV SOLN
INTRAVENOUS | Status: DC | PRN
  Administered 2017-05-04: 6 mL via INTRAVENOUS

## 2017-05-04 MED ORDER — SODIUM CHLORIDE 0.9% FLUSH: 3.0000 mL | Freq: Two times a day (BID) | INTRAVENOUS | Status: DC

## 2017-05-04 MED ORDER — PROPOFOL 10 MG/ML IV BOLUS
INTRAVENOUS | Status: AC
  Filled 2017-05-04: qty 20

## 2017-05-04 MED ORDER — ONDANSETRON HCL 4 MG/2ML IJ SOLN: 4.0000 mg | Freq: Four times a day (QID) | INTRAMUSCULAR | Status: DC | PRN

## 2017-05-04 MED ORDER — SODIUM CHLORIDE 0.9 % IV SOLN: 250.0000 mL | INTRAVENOUS | Status: DC

## 2017-05-04 MED ORDER — FUROSEMIDE 20 MG PO TABS
20.0000 mg | ORAL_TABLET | Freq: Every day | ORAL | Status: DC
  Administered 2017-05-07 – 2017-05-11 (×5): 20 mg via ORAL
  Filled 2017-05-04 (×7): qty 1

## 2017-05-04 MED ORDER — LORAZEPAM 2 MG/ML IJ SOLN
1.0000 mg | Freq: Once | INTRAMUSCULAR | Status: AC
  Administered 2017-05-04: 1 mg via INTRAVENOUS

## 2017-05-04 MED ORDER — BISACODYL 10 MG RE SUPP: 10.0000 mg | Freq: Every day | RECTAL | Status: DC | PRN

## 2017-05-04 MED ORDER — FLEET ENEMA 7-19 GM/118ML RE ENEM: 1.0000 | ENEMA | Freq: Once | RECTAL | Status: DC | PRN

## 2017-05-04 MED ORDER — THROMBIN 5000 UNITS EX SOLR
CUTANEOUS | Status: AC
  Filled 2017-05-04: qty 5000

## 2017-05-04 MED ORDER — HYDROCODONE-ACETAMINOPHEN 5-325 MG PO TABS
1.0000 | ORAL_TABLET | ORAL | Status: DC | PRN
  Administered 2017-05-05 – 2017-05-11 (×10): 1 via ORAL
  Filled 2017-05-04 (×12): qty 1

## 2017-05-04 MED ORDER — PROPRANOLOL HCL 80 MG PO TABS
80.0000 mg | ORAL_TABLET | Freq: Two times a day (BID) | ORAL | Status: DC
  Administered 2017-05-04 – 2017-05-11 (×10): 80 mg via ORAL
  Filled 2017-05-04 (×15): qty 1

## 2017-05-04 MED ORDER — HEMOSTATIC AGENTS (NO CHARGE) OPTIME
TOPICAL | Status: DC | PRN
  Administered 2017-05-04 (×3): 1 via TOPICAL

## 2017-05-04 MED ORDER — PROPOFOL 10 MG/ML IV BOLUS
INTRAVENOUS | Status: DC | PRN
  Administered 2017-05-04: 30 mg via INTRAVENOUS

## 2017-05-04 MED ORDER — EPHEDRINE 5 MG/ML INJ
INTRAVENOUS | Status: AC
  Filled 2017-05-04: qty 10

## 2017-05-04 MED ORDER — PROMETHAZINE HCL 25 MG/ML IJ SOLN: 6.2500 mg | INTRAMUSCULAR | Status: DC | PRN

## 2017-05-04 MED ORDER — LORATADINE 10 MG PO TABS
10.0000 mg | ORAL_TABLET | Freq: Every day | ORAL | Status: DC
  Administered 2017-05-07 – 2017-05-11 (×5): 10 mg via ORAL
  Filled 2017-05-04 (×7): qty 1

## 2017-05-04 MED ORDER — KCL IN DEXTROSE-NACL 20-5-0.45 MEQ/L-%-% IV SOLN
INTRAVENOUS | Status: DC
  Administered 2017-05-04: 21:00:00 via INTRAVENOUS
  Filled 2017-05-04: qty 1000

## 2017-05-04 MED ORDER — ONDANSETRON HCL 4 MG/2ML IJ SOLN
INTRAMUSCULAR | Status: AC
  Filled 2017-05-04: qty 2

## 2017-05-04 MED ORDER — FENTANYL CITRATE (PF) 100 MCG/2ML IJ SOLN: 25.0000 ug | INTRAMUSCULAR | Status: DC | PRN

## 2017-05-04 MED ORDER — CEFAZOLIN SODIUM-DEXTROSE 2-4 GM/100ML-% IV SOLN
INTRAVENOUS | Status: AC
  Filled 2017-05-04: qty 100

## 2017-05-04 MED ORDER — SENNOSIDES-DOCUSATE SODIUM 8.6-50 MG PO TABS: 1.0000 | ORAL_TABLET | Freq: Every evening | ORAL | Status: DC | PRN

## 2017-05-04 MED ORDER — ACETAMINOPHEN 650 MG RE SUPP
650.0000 mg | RECTAL | Status: DC | PRN
Start: 2017-05-04 — End: 2017-05-11
  Administered 2017-05-04 – 2017-05-06 (×3): 650 mg via RECTAL
  Filled 2017-05-04 (×3): qty 1

## 2017-05-04 SURGICAL SUPPLY — 69 items
APL SRG 60D 8 XTD TIP BNDBL (TIP) ×3
BANDAGE ADH SHEER 1  50/CT (GAUZE/BANDAGES/DRESSINGS) ×8 IMPLANT
BIT DRILL NEURO 2X3.1 SFT TUCH (MISCELLANEOUS) IMPLANT
BLADE CLIPPER SURG (BLADE) ×2 IMPLANT
BLADE SURG 11 STRL SS (BLADE) ×4 IMPLANT
BNDG GAUZE ELAST 4 BULKY (GAUZE/BANDAGES/DRESSINGS) ×4 IMPLANT
BOOT SUTURE AID YELLOW STND (SUTURE) ×1 IMPLANT
CANISTER SUCT 3000ML PPV (MISCELLANEOUS) ×2 IMPLANT
CARTRIDGE OIL MAESTRO DRILL (MISCELLANEOUS) ×1 IMPLANT
CLIP RANEY DISP (INSTRUMENTS) ×2 IMPLANT
DECANTER SPIKE VIAL GLASS SM (MISCELLANEOUS) ×5 IMPLANT
DIFFUSER DRILL AIR PNEUMATIC (MISCELLANEOUS) ×2 IMPLANT
DRAPE POUCH INSTRU U-SHP 10X18 (DRAPES) ×2 IMPLANT
DRAPE STERI IOBAN 125X83 (DRAPES) ×1 IMPLANT
DRILL NEURO 2X3.1 SOFT TOUCH (MISCELLANEOUS) ×2
DRSG TEGADERM 2-3/8X2-3/4 SM (GAUZE/BANDAGES/DRESSINGS) ×4 IMPLANT
DRSG TELFA 3X8 NADH (GAUZE/BANDAGES/DRESSINGS) ×2 IMPLANT
DURASEAL APPLICATOR TIP (TIP) ×6 IMPLANT
DURASEAL SPINE SEALANT 3ML (MISCELLANEOUS) ×6 IMPLANT
GAUZE SPONGE 4X4 12PLY STRL (GAUZE/BANDAGES/DRESSINGS) IMPLANT
GAUZE SPONGE 4X4 16PLY XRAY LF (GAUZE/BANDAGES/DRESSINGS) IMPLANT
GLOVE BIO SURGEON STRL SZ8 (GLOVE) ×4 IMPLANT
GLOVE BIOGEL PI IND STRL 7.0 (GLOVE) ×1 IMPLANT
GLOVE BIOGEL PI IND STRL 7.5 (GLOVE) IMPLANT
GLOVE BIOGEL PI IND STRL 8 (GLOVE) ×2 IMPLANT
GLOVE BIOGEL PI IND STRL 8.5 (GLOVE) ×2 IMPLANT
GLOVE BIOGEL PI INDICATOR 7.0 (GLOVE) ×1
GLOVE BIOGEL PI INDICATOR 7.5 (GLOVE) ×1
GLOVE BIOGEL PI INDICATOR 8 (GLOVE) ×2
GLOVE BIOGEL PI INDICATOR 8.5 (GLOVE) ×2
GLOVE ECLIPSE 8.0 STRL XLNG CF (GLOVE) ×4 IMPLANT
GLOVE SS N UNI LF 6.5 STRL (GLOVE) ×3 IMPLANT
GOWN STRL REUS W/ TWL LRG LVL3 (GOWN DISPOSABLE) IMPLANT
GOWN STRL REUS W/ TWL XL LVL3 (GOWN DISPOSABLE) ×1 IMPLANT
GOWN STRL REUS W/TWL 2XL LVL3 (GOWN DISPOSABLE) ×1 IMPLANT
GOWN STRL REUS W/TWL LRG LVL3 (GOWN DISPOSABLE) ×2
GOWN STRL REUS W/TWL XL LVL3 (GOWN DISPOSABLE) ×2
HEMOSTAT POWDER KIT SURGIFOAM (HEMOSTASIS) ×2 IMPLANT
KIT ACCESSORY (KITS) ×1
KIT ACCESSORY NEUROSURG SU (KITS) IMPLANT
KIT BASIN OR (CUSTOM PROCEDURE TRAY) ×2 IMPLANT
KIT HEADREST PASSIVE (NEUROSURGERY SUPPLIES) ×2 IMPLANT
KIT ROOM TURNOVER OR (KITS) ×2 IMPLANT
LEAD DBS (Neuro Prosthesis/Implant) ×4 IMPLANT
MARKER SKIN DUAL TIP RULER LAB (MISCELLANEOUS) ×4 IMPLANT
MICRO TARGETING ELECTRODE 1X ×1 IMPLANT
NDL HYPO 25X1 1.5 SAFETY (NEEDLE) ×2 IMPLANT
NEEDLE HYPO 25X1 1.5 SAFETY (NEEDLE) ×4 IMPLANT
NEEDLE SPNL 18GX3.5 QUINCKE PK (NEEDLE) IMPLANT
NEEDLE SPNL 22GX3.5 QUINCKE BK (NEEDLE) IMPLANT
NS IRRIG 1000ML POUR BTL (IV SOLUTION) ×2 IMPLANT
OIL CARTRIDGE MAESTRO DRILL (MISCELLANEOUS) ×2
PACK LAMINECTOMY NEURO (CUSTOM PROCEDURE TRAY) ×2 IMPLANT
PAD ARMBOARD 7.5X6 YLW CONV (MISCELLANEOUS) ×4 IMPLANT
PERFORATOR LRG  14-11MM (BIT) ×1
PERFORATOR LRG 14-11MM (BIT) ×1 IMPLANT
PLATFORM BILATERAL KIT (MISCELLANEOUS) ×1 IMPLANT
SPONGE SURGIFOAM ABS GEL SZ50 (HEMOSTASIS) IMPLANT
STAPLER SKIN PROX WIDE 3.9 (STAPLE) IMPLANT
STIMLOC BURR HOLE COVER ×3 IMPLANT
STRIP CLOSURE SKIN 1/2X4 (GAUZE/BANDAGES/DRESSINGS) ×1 IMPLANT
SUT ETHILON 3 0 PS 1 (SUTURE) IMPLANT
SUT SILK 2 0 TIES 10X30 (SUTURE) ×2 IMPLANT
SUT VIC AB 2-0 CP2 18 (SUTURE) ×4 IMPLANT
TOWEL GREEN STERILE (TOWEL DISPOSABLE) ×2 IMPLANT
TOWEL GREEN STERILE FF (TOWEL DISPOSABLE) ×2 IMPLANT
TUBE CONNECTING 12X1/4 (SUCTIONS) ×1 IMPLANT
WATER STERILE IRR 1000ML POUR (IV SOLUTION) ×2 IMPLANT
WAYPOINT SURGICAL KIT ×1 IMPLANT

## 2017-05-04 NOTE — Progress Notes (Signed)
Awake. Alert, conversant.  MAEW.  Less tremor.  Doing well.

## 2017-05-04 NOTE — Transfer of Care (Signed)
Immediate Anesthesia Transfer of Care Note  Patient: Brendan [name]  Procedure(s) Performed: Bilateral Deep brain stimulator placement (Bilateral )  Patient Location: PACU  Anesthesia Type:MAC  Level of Consciousness: drowsy  Airway & Oxygen Therapy: Patient Spontanous Breathing and Patient connected to nasal cannula oxygen  Post-op Assessment: Report given to RN and Post -op Vital signs reviewed and stable  Post vital signs: Reviewed and stable  Last Vitals:  Vitals:   05/04/17 0639  BP: (!) 154/77  Pulse: (!) 55  Resp: 20  Temp: 37 C  SpO2: 97%    Last Pain:  Vitals:   05/04/17 0639  TempSrc: Oral      Patients Stated Pain Goal: 3 (76/[id number]/41 [id number])  Complications: No apparent anesthesia complications

## 2017-05-04 NOTE — H&P (Signed)
History and Physical Interval Note:  05/04/2017 7:15 AM  Brendan [name]  has presented today for surgery, with the diagnosis of Essential Tremor  The various methods of treatment have been discussed with the patient and family. After consideration of risks, benefits and other options for treatment, the patient has consented to  Procedure(s) with comments: Bilateral Deep Brain Stimulating electrode placement as a surgical intervention .  The patient's history has been reviewed, patient examined, no change in status, stable for surgery.  I have reviewed the patient's chart and labs.  Questions were answered to the patient's satisfaction.     Kahlin Mark D

## 2017-05-04 NOTE — Interval H&P Note (Signed)
History and Physical Interval Note:  05/04/2017 7:45 AM  Brendan [name]  has presented today for surgery, with the diagnosis of Essential tremor  The various methods of treatment have been discussed with the patient and family. After consideration of risks, benefits and other options for treatment, the patient has consented to  Procedure(s) with comments: Bilateral Deep brain stimulator placement (Bilateral) - Bilateral deep brain stimulator placement as a surgical intervention .  The patient's history has been reviewed, patient examined, no change in status, stable for surgery.  I have reviewed the patient's chart and labs.  Questions were answered to the patient's satisfaction.     Kaelie Henigan D

## 2017-05-04 NOTE — Op Note (Signed)
Preoperative diagnosis:  Essential Tremor Postoperative diagnosis:  Essential Tremor Surgeon:  Maeola HarmanJoseph Stern Neurologist:  Lurena Joinerebecca Tat  Indication for procedure: Determination of optimal electrode position for deep brain stimulation therapy. Complications: None   Description of procedure:  Following the incision and burr hole placement, a recording microelectrode was slowly advanced into the brain via a motorized drive.  Beginning approximately 10 mm above the target, recordings were periodically made, and the resultant brain activity was described on the neurophysiologic recording worksheet.  Relevant samples of the recordings were saved for subsequent off line analysis.  A total of one recording pass was obtained on the right side of the brain.    5 mm of VIM were recorded from the right side of the brain.  Stimulation was done within the tip of the cannula and good result was obtained at target. Following this, the recording electrode was replaced by a stimulating electrode by Dr. Venetia MaxonStern.  Test stimulations were then obtained.  Active contacts that were used and the response to stimulation, including both adverse effects and therapeutic effects, were recorded on the neurophysiologic stimulation worksheet.  In brief, there was complete resolution of tremor following activation and stimulation.  Adverse effects were not noted on that side  A target of mirror image of the initial side was selected for the contralateral side and one recording pass was obtained on the opposite side of the brain.  9 mm of VIM were recorded from the left  side of the brain.  Tremor cells and response to passive stimulation were noted.  Stim was done within the recording electrode and good response was obtained although it was noted that the patient had ataxia.  Paresthesias were noted with stim at 3mm above target at 6.45 V.  Paresthesias were noted with stim at target at 3.5 V but they were transient.  The recording  electrode was again replaced by a stimulating electrode and test stimulations were obtained.  Again, there was resolution of tremor at therapeutic voltages with no side effects occurring at therapeutic voltages. Final electrode position was determined and the electrode was secured in place by Dr. Venetia MaxonStern on each side.  Kerin Salenebecca Tat, DO [facility] Director of Movement Disorders

## 2017-05-04 NOTE — Progress Notes (Signed)
Patient noted with post-op confusion trying to getting out of bed and pushing trash can to ambulate. Family behind patient trying to convience patient to go back to room but family was unable to get patient to go back inside. Staff helping patient move around the unit with patient still confused and trying to get on the elevator. Dr. Venetia MaxonStern notified and new orders received to transfer patient and safety sitter also ordered. Will continue to monitor

## 2017-05-04 NOTE — Op Note (Signed)
05/04/2017  11:58 AM  PATIENT:  Brendan [name]  76 y.o. male  PRE-OPERATIVE DIAGNOSIS:  Essential tremor  POST-OPERATIVE DIAGNOSIS:  Essential tremor  PROCEDURE:  Procedure(s) with comments: Bilateral Deep brain stimulator placement (Bilateral) - Bilateral deep brain stimulator placement  SURGEON:  Surgeon(s) and Role:    * Kynzlie Hilleary, MD - Primary  PHYSICIAN ASSISTANT:   ASSISTANTS: Poteat, RN   ANESTHESIA:   IV sedation  EBL:  150 mL   BLOOD ADMINISTERED:none  DRAINS: none   LOCAL MEDICATIONS USED:  MARCAINE    and LIDOCAINE   SPECIMEN:  No Specimen  DISPOSITION OF SPECIMEN:  N/A  COUNTS:  YES  TOURNIQUET:  * No tourniquets in log *  DICTATION: DICTATION:   Indications: Patient is a 76-year-old man with Essential Tremor it was elected to take him to surgery for bilateral VIM Thalamus deep brain stimulator electrode placement  Procedure: Preoperative planing was performed with volumetricCT and placement of 4 fiducial markers in the skull followed by CT of the brain also obtained volumetrically. These were then exported to create Starfix head frame with planned targeting of VIM nucleus electrodes was performed. The patient was brought to the operating room and placed in a semi-Fowler's position with his neck and stabilized in a neck holder. This was affixed to the [place] adapter. His scalp was then prepped with betadine scrub and DuraPrep and subsequently draped with an [place] drape.with the fiducials and the skin was infiltrated with local lidocaine.  The areas of planned incision were then infiltrated with local lidocaine with epinephrine. The stepoffs were connected and the Starfix frame was assembled.  The entry points were then marked.  2 curvilinear incisions were made centered on the bilateral entry points. An elevator was used to clear pericranium from the skull. The perforator it was then used to produce two 14 mm bur holes. The dura was coagulated with bipolar  electrocautery. Initially we operated on the right and subsequently on the left. After opening the dura and a localizing the entry point the stylette and outer cannula were inserted into the brain. Duraseal was placed to prevent CSF leakage at each entry site. Microelectrode recordings were then performed and  we had very good recordings from the VIM. Subsequently the stimulating electrode was placed and the patient had significant improvement in tremor on the left side of the body without significant side effects to higher voltages. The electrode was then locked into position with the stimlock cap. The redundant electrode was circularized and tunneled under the scalp. The assembly was reassembled on the left.  There was good microelectrode recordings for a long run of VIM and after placing the stimulating electrode the patient had excellent control of tremor on the right side of his body.  We therefore elected to place this electrode and locked into position and the stereotactic assembly was disassembled. The electrode was tunneled to the left and then into the posterior scalp and locked into position. The wounds were then irrigated and closed with 2-0 Vicryl sutures and staples. The fiducials were removed and staples were placed over each of these sites. The head was washed and then sterile occlusive dressings were placed. The patient was taken to recovery having tolerated her procedure without difficulty or untoward effect. Please refer to detailed microelectrode recordings from Dr. Tat for more specifics of the positioning of the electrodes. These are included in her Epic note from the detailed neural monitoring and physical exam assessment during the surgery.  PLAN   OF CARE: Admit to inpatient   PATIENT DISPOSITION:  PACU - hemodynamically stable.   Delay start of Pharmacological VTE agent (>24hrs) due to surgical blood loss or risk of bleeding: yes

## 2017-05-04 NOTE — Consult Note (Signed)
Cardiology Consultation:   Patient ID: Brendan [name]; [id number]; 1941/01/20   Admit date: 05/04/2017 Date of Consult: 05/04/2017  Primary Care Provider: [name], MD Primary Cardiologist: Bryan Lemma, MD   Patient Profile:   Brendan [name] is a 77 y.o. male with a hx of with hx of CAD s/p CABG, HLD, HTN, GERD and essential tremor who is being seen today for the evaluation of pre-operative clearance at the request of Dr. [name].   He has a h/o CAD with CABGx3 in 1997. Unstable Angina in June 2013 - Occluded SVG-Diag with Patent LIMA-LAD &SVG-OM treated medically. He does have chronic stable angina. He is on Inderal as BB to help Rx tremors along with CAD.   Last Myoview 09/2015: EF 55-65%. No ischemia or infarction noted. LOW RISK  Last seen by Dr. Herbie Baltimore 09/2016. He doesn't do enough activity to notice any exertional symptoms. He takes lasix for LE edema which likely due to sedentary lifestyle.   History of Present Illness:   [name] has hx of essential Tremor. He is presented today for determination of optimal electrode position for deep brain stimulation therapy. He underwent activation and stimulation. He need to go out for bathroom.   Cardiology is asked for evaluation given hx of CAD and intermittent chest discomfort. The patient endorse to taking 1-2 nitroglycerine/week for chest discomfort. He does not do any exertional activity. Mostly sedentary lifestyle.  The patient and nurse denied any chest pain today. No orthopnea, PND, syncope or dizziness.   Past Medical History:  Diagnosis Date  . Anxiety disorder   . Benign enlargement of prostate   . CAD in native artery 1997   Referred for CABG x3 in 2004 for LAD diagonal bifurcation lesion  . Cervical spinal stenosis   . Complex regional pain syndrome of right upper extremity    Right wrist; L arm  . Coronary atherosclerosis of artery bypass graft June 2013   Occluded SVG-D1; Cardiologist Dr. Herbie Baltimore  . Gait  disorder   . Gastroesophageal reflux disease   . Glaucoma   . Hiatal hernia    GI: Dr Evette Cristal  . Hyperlipidemia LDL goal <70   . Hypertension   . Memory loss    Mild  . Obesity   . Peripheral neuropathy    possible peripheral neuropathy  . S/P CABG x 3 2004    LIMA-LAD, SVG to diagonal, SVG to OM  . Tremor, essential    On Primidone  . Unstable angina pectoris [facility])  June 2013   Cardiac cath: occluded SVG-DI. Patent LIMA-LAD and SVG-OM. EF 45% with apical inferior HK.    Past Surgical History:  Procedure Laterality Date  . CARDIAC CATHETERIZATION    . CATARACT EXTRACTION, BILATERAL    . CORONARY ARTERY BYPASS GRAFT  06/2002   LIMA-LAD, SVG-D1, SVG-OM  . EYE SURGERY    . KNEE SURGERY Left   . LEFT HEART CATHETERIZATION WITH CORONARY/GRAFT ANGIOGRAM N/A 10/13/2011   Procedure: LEFT HEART CATHETERIZATION WITH Isabel Caprice;  Surgeon: Marykay Lex, MD;  Location: [facility] CATH LAB;  Service: Cardiovascular;  Laterality: N/A;  . MINOR PLACEMENT OF FIDUCIAL N/A 04/26/2017   Procedure: Fiducial placement;  Surgeon: Maeola Harman, MD;  Location: [facility] OR;  Service: Neurosurgery;  Laterality: N/A;  Fiducial placement  . NM MYOCAR PERF WALL MOTION  03/23/2009   protocol:Bruce, normal perfusion in all regions, post-stress EF 72%, exercise capacity , EKG negative for ischemia.  . Shoulder Orthoscopic Surgery   06/2003  .  TRANSTHORACIC ECHOCARDIOGRAM  April 2014   EF 55-60%; no regional wall motion abnormalities. Relatively normal study normal diastolic parameters/abnormal relaxation  . wrist ganglion cyst Left      Home Medications:  Prior to Admission medications   Medication Sig Start Date End Date Taking? Authorizing Provider  atorvastatin (LIPITOR) 40 MG tablet Take 1 tablet (40 mg total) by mouth daily. 11/28/16  Yes [name], MD  furosemide (LASIX) 20 MG tablet Take 1 tablet (20 mg total) by mouth daily. May take an  Extra dose if needed Patient taking differently: Take  20 mg by mouth See admin instructions. Take 20 mg by mouth daily. May take an extra 20 mg if needed for fluid 09/28/16  Yes Marykay Lex, MD  HYDROcodone-acetaminophen (NORCO/VICODIN) 5-325 MG tablet Take 1 tablet by mouth every 6 (six) hours as needed for moderate pain.   Yes [provider]  ketoconazole (NIZORAL) 2 % shampoo Apply 1 application topically 2 (two) times a week. Patient taking differently: Apply 1 application topically once a week.  10/05/16  Yes Bacigalupo, Marzella Schlein, MD  loratadine (CLARITIN) 10 MG tablet Take 1 tablet (10 mg total) by mouth daily. 06/27/16  Yes Leslye Peer, MD  losartan (COZAAR) 100 MG tablet Take 1 tablet (100 mg total) by mouth daily. 12/01/16  Yes [name], MD  neomycin-bacitracin-polymyxin (NEOSPORIN) ointment Apply 1 application topically as needed for wound care.   Yes [provider]  nitroGLYCERIN (NITROSTAT) 0.4 MG SL tablet Place 1 tablet (0.4 mg total) under the tongue every 5 (five) minutes as needed for chest pain. call 911 if chest pain not better 05/01/16  Yes Bacigalupo, Marzella Schlein, MD  omeprazole (PRILOSEC) 20 MG capsule Take 1 capsule (20 mg total) by mouth daily as needed (acid reflux). 05/01/16  Yes Bacigalupo, Marzella Schlein, MD  propranolol (INDERAL) 80 MG tablet Take 1 tablet (80 mg total) by mouth 2 (two) times daily. 10/05/16  Yes Bacigalupo, Marzella Schlein, MD  Propylene Glycol (SYSTANE BALANCE) 0.6 % SOLN Place 1 drop into both eyes daily as needed (for dry eyes).   Yes [provider]  aspirin (LONGS ADULT LOW STRENGTH ASA) 81 MG EC tablet Take 81 mg by mouth daily.      [provider]  fish oil-omega-3 fatty acids 1000 MG capsule Take 1 g by mouth 2 (two) times daily.    [provider]  gabapentin (NEURONTIN) 300 MG capsule Take 2 capsules (600 mg total) by mouth 3 (three) times daily. Patient not taking: Reported on 05/03/2017 07/25/16   Erasmo Downer, MD  primidone (MYSOLINE) 50 MG tablet  04/19/17: 250 mg am, 200 mg hs; 04/20/17: 200 mg am, 200 mg hs, 04/21/17: 200 mg am, 150 mg hs; 1/6-1/7: 150 mg BID; 1/8-1/9: 150 mg am, 100 mg hs; 1/10-1/11: 100 mg BID; 1/12-1/13: 100 mg am, 50 mg hs; 1/14: 50 mg BID, 1/15: 50 mg QD, 1/16: Stop 04/19/17   Tat, Octaviano Batty, DO    Inpatient Medications: Scheduled Meds: . aspirin  81 mg Oral Daily  . atorvastatin  40 mg Oral Daily  . Chlorhexidine Gluconate Cloth  6 each Topical Once   And  . Chlorhexidine Gluconate Cloth  6 each Topical Once  . docusate sodium  100 mg Oral BID  . furosemide  20 mg Oral See admin instructions  . loratadine  10 mg Oral Daily  . losartan  100 mg Oral Daily  . pantoprazole  40 mg Oral Daily  .  propranolol  80 mg Oral BID  . sodium chloride flush  3 mL Intravenous Q12H   Continuous Infusions: .  ceFAZolin (ANCEF) IV    . dextrose 5 % and 0.45 % NaCl with KCl 20 mEq/L     PRN Meds: bisacodyl, fentaNYL (SUBLIMAZE) injection, HYDROcodone-acetaminophen, HYDROcodone-acetaminophen, HYDROcodone-acetaminophen, menthol-cetylpyridinium **OR** phenol, meperidine (DEMEROL) injection, morphine injection, neomycin-bacitracin-polymyxin, nitroGLYCERIN, ondansetron **OR** ondansetron (ZOFRAN) IV, promethazine, senna-docusate, sodium chloride flush, sodium phosphate, zolpidem  Allergies:    Allergies  Allergen Reactions  . Lisinopril Cough    Social History:   Social History   Socioeconomic History  . Marital status: Married    Spouse name: [name]  . Number of children: 2  . Years of education: 10  . Highest education level: Not on file  Social Needs  . Financial resource strain: Not on file  . Food insecurity - worry: Not on file  . Food insecurity - inability: Not on file  . Transportation needs - medical: Not on file  . Transportation needs - non-medical: Not on file  Occupational History  . Occupation: retired    Comment: Geneticist, [employer] - facilities management x 30 years  Tobacco Use  . Smoking status: Former Smoker      Packs/day: 3.00    Years: 20.00    Pack years: 60.00    Types: Cigarettes    Last attempt to quit: 04/17/1968    Years since quitting: 49.0  . Smokeless tobacco: Never Used  Substance and Sexual Activity  . Alcohol use: No    Frequency: Never    Comment: Occasional 2 mixed drinks or 3 beers.  . Drug use: No  . Sexual activity: No  Other Topics Concern  . Not on file  Social History Narrative   Married.  Wife is Royal PiedraMary Manfredo.   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   Former smoker, quit 20 years ago. Does not drink alcohol.   Does not get routine exercise, walks sometimes with a cane.   Caffeine use: diet-soda   Left handed     Family History:    Family History  Problem Relation Age of Onset  . Heart disease Mother   . Heart disease Brother   . Tremor Paternal Uncle   . Heart disease Brother   . Drug abuse Daughter   . Hepatitis C Daughter      ROS:  Please see the history of present illness.  Review of Systems  All other systems reviewed and are negative. All other ROS reviewed and negative.     Physical Exam/Data:   Vitals:   05/04/17 0639 05/04/17 1205 05/04/17 1219 05/04/17 1235  BP: (!) 154/77 (!) 97/56 116/76 130/77  Pulse: (!) 55 (!) 52 (!) 59 (!) 56  Resp: 20 12 12 17   Temp: 98.6 F (37 C) (!) 97 F (36.1 C)    TempSrc: Oral     SpO2: 97% 97% 100% 97%  Weight: 211 lb (95.7 kg)     Height: 5\' 6"  (1.676 m)       Intake/Output Summary (Last 24 hours) at 05/04/2017 1326 Last data filed at 05/04/2017 1145 Gross per 24 hour  Intake 900 ml  Output 150 ml  Net 750 ml   Filed Weights   05/04/17 0632 05/04/17 0639  Weight: 211 lb (95.7 kg) 211 lb (95.7 kg)   Body mass index is 34.06 kg/m.  General:  Well nourished, well developed, in no acute distress HEENT: Surgical scar and electrodes in place Lymph: no  adenopathy Neck: no JVD Endocrine:  No thryomegaly Vascular: No carotid bruits; FA pulses 2+ bilaterally without bruits  Cardiac:   normal S1, S2; RRR; no murmur  Lungs:  clear to auscultation bilaterally, no wheezing, rhonchi or rales  Abd: soft, nontender, no hepatomegaly  Ext: no edema Musculoskeletal:  No deformities, BUE and BLE strength normal and equal Skin: warm and dry  Neuro:  CNs 2-12 intact, no focal abnormalities noted Psych:  Normal affect   EKG:  The EKG was personally reviewed and demonstrates: 09/28/2016: NSR at rate of 61 bpm Telemetry:  Telemetry was personally reviewed and demonstrates:  NSR  Relevant CV Studies:   Stress test 09/2015  The left ventricular ejection fraction is normal (55-65%). No wall motion abnormalities.  Nuclear stress EF: 59%.  There was no ST segment deviation noted during stress.  The study is normal. There is no ischemia identified. Normal perfusion.  This is a low risk study.   Donato Schultz, MD  Radiology/Studies:  No results found.  Assessment and Plan:   1. Stable angina with hx of CAD s/p CABG - The patient takes 1-2 nitro/months for chest discomfort with resolution. He has sedentary lifestyle. His symptoms has been stable for months. Last stress test in 2016 was low risk.  - Continue home ASA, statin, losartan and inderal.  - Will get [employer] tomorrow.   2. Pre-op clearance - pending stress test.    For questions or updates, please contact [facility] Please consult [email or web address] for contact info under Cardiology/STEMI.   Vonzella Nipple Inez, [place]  05/04/2017 1:26 PM    I have seen, examined and evaluated the patient this PM along with Manson Passey, PA .  After reviewing all the available data and chart, we discussed the patients laboratory, study & physical findings as well as symptoms in detail. I agree with his findings, examination as well as impression recommendations as per our discussion.    Chioke is well-known to me.  I last saw him in July, he was doing relatively well.  He is very limited as far as any activity.  He  tells me that he has been doing fairly well for the last several months, but he did use nitroglycerin a couple times back in the summertime.  He just has not really been all that active and is pending deep brain stimulation with hopes for this getting him more mobile.  He last had a stress test in 2017 that was negative for ischemia.  At this point for preoperative risk assessment for general anesthesia in a patient with known coronary disease and history of CABG, it is not unreasonable to go ahead and proceed with a Myoview stress test to exclude ischemic disease prior to surgery with general anesthesia.  The fact that he is not mobile means we are not able to really know if he has any.  He does have stable angina, that has been well treated medically.  No heart failure symptoms.  Otherwise, he does not have history of stroke, does not have heart failure, does not have diabetes on insulin, and the plan surgery is not high risk from a cardiac standpoint.  If we are able to exclude any ischemic CAD with Myoview, he should be fine proceeding with noncardiac surgery without any further evaluation.  We will arrange for him to have a Myoview stress test prior to discharge tomorrow.  He will be seen by my partner tomorrow following a stress test to get further recognitions.  Glenetta Hew, M.D., M.S. Interventional Cardiologist   Pager # [phone] Phone # [phone] [address]. [place], [place] [address]

## 2017-05-04 NOTE — Anesthesia Postprocedure Evaluation (Signed)
Anesthesia Post Note  Patient: Brendan [name]  Procedure(s) Performed: Bilateral Deep brain stimulator placement (Bilateral )     Patient location during evaluation: PACU Anesthesia Type: MAC Level of consciousness: awake and alert Pain management: pain level controlled Vital Signs Assessment: post-procedure vital signs reviewed and stable Respiratory status: spontaneous breathing Cardiovascular status: stable Anesthetic complications: no Comments: Patient had recent chest pain and diaphoresis. Discussed with Dr. Vertell Limber that I felt he should be seen by cardiology prior to his next procedure given that it requires a general anesthetic. Cardiology evaluated pt. In PACU    Last Vitals:  Vitals:   05/04/17 1415 05/04/17 1416  BP:  140/78  Pulse: 65 67  Resp: 11 17  Temp:    SpO2: 97% 97%    Last Pain:  Vitals:   05/04/17 1416  TempSrc:   PainSc: 0-No pain                 Nolon Nations

## 2017-05-04 NOTE — Brief Op Note (Signed)
05/04/2017  11:58 AM  PATIENT:  Brendan [name]  77 y.o. male  PRE-OPERATIVE DIAGNOSIS:  Essential tremor  POST-OPERATIVE DIAGNOSIS:  Essential tremor  PROCEDURE:  Procedure(s) with comments: Bilateral Deep brain stimulator placement (Bilateral) - Bilateral deep brain stimulator placement  SURGEON:  Surgeon(s) and Role:    Maeola Harman* Daelen Belvedere, MD - Primary  PHYSICIAN ASSISTANT:   ASSISTANTS: Poteat, RN   ANESTHESIA:   IV sedation  EBL:  150 mL   BLOOD ADMINISTERED:none  DRAINS: none   LOCAL MEDICATIONS USED:  MARCAINE    and LIDOCAINE   SPECIMEN:  No Specimen  DISPOSITION OF SPECIMEN:  N/A  COUNTS:  YES  TOURNIQUET:  * No tourniquets in log *  DICTATION: DICTATION:   Indications: Patient is a 77 year old man with Essential Tremor it was elected to take him to surgery for bilateral VIM Thalamus deep brain stimulator electrode placement  Procedure: Preoperative planing was performed with volumetricCT and placement of 4 fiducial markers in the skull followed by CT of the brain also obtained volumetrically. These were then exported to create Starfix head frame with planned targeting of VIM nucleus electrodes was performed. The patient was brought to the operating room and placed in a semi-Fowler's position with his neck and stabilized in a neck holder. This was affixed to the [place] adapter. His scalp was then prepped with betadine scrub and DuraPrep and subsequently draped with an Ioban drape.with the fiducials and the skin was infiltrated with local lidocaine.  The areas of planned incision were then infiltrated with local lidocaine with epinephrine. The stepoffs were connected and the Starfix frame was assembled.  The entry points were then marked.  2 curvilinear incisions were made centered on the bilateral entry points. An elevator was used to clear pericranium from the skull. The perforator it was then used to produce two 14 mm bur holes. The dura was coagulated with bipolar  electrocautery. Initially we operated on the right and subsequently on the left. After opening the dura and a localizing the entry point the stylette and outer cannula were inserted into the brain. Duraseal was placed to prevent CSF leakage at each entry site. Microelectrode recordings were then performed and  we had very good recordings from the VIM. Subsequently the stimulating electrode was placed and the patient had significant improvement in tremor on the left side of the body without significant side effects to higher voltages. The electrode was then locked into position with the stimlock cap. The redundant electrode was circularized and tunneled under the scalp. The assembly was reassembled on the left.  There was good microelectrode recordings for a long run of VIM and after placing the stimulating electrode the patient had excellent control of tremor on the right side of his body.  We therefore elected to place this electrode and locked into position and the stereotactic assembly was disassembled. The electrode was tunneled to the left and then into the posterior scalp and locked into position. The wounds were then irrigated and closed with 2-0 Vicryl sutures and staples. The fiducials were removed and staples were placed over each of these sites. The head was washed and then sterile occlusive dressings were placed. The patient was taken to recovery having tolerated her procedure without difficulty or untoward effect. Please refer to detailed microelectrode recordings from Dr. Arbutus Leasat for more specifics of the positioning of the electrodes. These are included in her Epic note from the detailed neural monitoring and physical exam assessment during the surgery.  PLAN  OF CARE: Admit to inpatient   PATIENT DISPOSITION:  PACU - hemodynamically stable.   Delay start of Pharmacological VTE agent (>24hrs) due to surgical blood loss or risk of bleeding: yes

## 2017-05-05 DIAGNOSIS — I25118 Atherosclerotic heart disease of native coronary artery with other forms of angina pectoris: Secondary | ICD-10-CM

## 2017-05-05 DIAGNOSIS — L8990 Pressure ulcer of unspecified site, unspecified stage: Secondary | ICD-10-CM

## 2017-05-05 MED ORDER — PNEUMOCOCCAL VAC POLYVALENT 25 MCG/0.5ML IJ INJ
0.5000 mL | INJECTION | INTRAMUSCULAR | Status: AC
  Administered 2017-05-08: 0.5 mL via INTRAMUSCULAR
  Filled 2017-05-05: qty 0.5

## 2017-05-05 NOTE — Progress Notes (Signed)
   Progress Note   Subjective   Unable to be still for myoview. I doubt that we will be able to obtain anytime soon.  Currently resting No CV concerns.  Inpatient Medications    Scheduled Meds: . atorvastatin  40 mg Oral q1800  . docusate sodium  100 mg Oral BID  . furosemide  20 mg Oral Daily  . loratadine  10 mg Oral Daily  . losartan  100 mg Oral Daily  . pantoprazole  40 mg Oral Daily  . propranolol  80 mg Oral BID   Continuous Infusions: . dextrose 5 % and 0.45 % NaCl with KCl 20 mEq/L Stopped (05/05/17 0457)   PRN Meds: acetaminophen **OR** acetaminophen, bisacodyl, HYDROcodone-acetaminophen, HYDROcodone-acetaminophen, menthol-cetylpyridinium **OR** phenol, morphine injection, nitroGLYCERIN, ondansetron **OR** ondansetron (ZOFRAN) IV, polyvinyl alcohol, senna-docusate, sodium phosphate, zolpidem   Vital Signs    Vitals:   05/05/17 0300 05/05/17 0400 05/05/17 0800 05/05/17 1130  BP:  137/80 (!) 163/83 (!) 165/88  Pulse:  70  89  Resp:  18 16 16   Temp: 99.1 F (37.3 C) 98.9 F (37.2 C) 99.6 F (37.6 C) 99.6 F (37.6 C)  TempSrc: Oral Oral Axillary Axillary  SpO2:  96% 94% 96%  Weight:      Height:        Intake/Output Summary (Last 24 hours) at 05/05/2017 1309 Last data filed at 05/05/2017 0600 Gross per 24 hour  Intake 867.5 ml  Output -  Net 867.5 ml   Filed Weights   05/04/17 [id number] 05/04/17 0639  Weight: 211 lb (95.7 kg) 211 lb (95.7 kg)     Physical Exam   GEN- The patient is ill appearing, sleeping Head- dressing in place Neck- supple, Lungs- Clear to ausculation bilaterally, normal work of breathing Heart- Regular rate and rhythm  GI- soft, NT, ND, + BS Extremities- no clubbing, cyanosis, or edema  MS- no significant deformity or atrophy Skin- no rash or lesion Psych- sleepoing   Labs    ChemistryNo results for input(s): NA, K, CL, CO2, GLUCOSE, BUN, CREATININE, CALCIUM, PROT, ALBUMIN, AST, ALT, ALKPHOS, BILITOT, GFRNONAA, GFRAA,  ANIONGAP in the last 168 hours.   HematologyNo results for input(s): WBC, RBC, HGB, HCT, MCV, MCH, MCHC, RDW, PLT in the last 168 hours.  Cardiac EnzymesNo results for input(s): TROPONINI in the last 168 hours. No results for input(s): TROPIPOC in the last 168 hours.      Assessment & Plan    1.  CAD Known stable angina Not currently a candidate for myoview I had a long discussion with his family.  We agree that it may be best to proceed with surgery if medically indicated, without further CV risk assessment at this time.  2. HTN Elevated at times, likely with agitation No change required today  Cardiology to be available as needed over the weekend  Hillis RangeJames Nashua Homewood MD, [facility] 05/05/2017 1:09 PM

## 2017-05-05 NOTE — Progress Notes (Signed)
Stress test scheduled this AM. Tech in to take patient to procedure. Patient had some combative episodes last night per off going staff. Stress test will be 3 hours per tech. Test postponed until RN speaks with  Neurosurgery MD

## 2017-05-05 NOTE — Progress Notes (Signed)
OT Cancellation Note  Patient Details Name: Brendan [name] MRN: [id number] DOB: 08/25/1940   Cancelled Treatment:    Reason Eval/Treat Not Completed: Patient not medically ready. Discussed case with RN who requests OT return next date as pt with increase in confusion and change in status. Will continue to follow.   Doristine Sectionharity A Swanson Farnell, MS OTR/L  Pager: [phone]   Doristine SectionCharity A Othal Kubitz 05/05/2017, 9:47 AM

## 2017-05-05 NOTE — Progress Notes (Signed)
PT Cancellation Note  Patient Details Name: Brendan [name] MRN: [id number] DOB: 06/11/1940   Cancelled Treatment:    Reason Eval/Treat Not Completed: Patient not medically ready. Discussed pt case with OT who reports that per her discussion with RN pt is not appropriate for therapy services this date as pt with increase in confusion and change in status. Will continue to follow and complete PT evaluation when medically ready.   Marylynn PearsonLaura D Duguay 05/05/2017, 10:50 AM  Brendan SlipperLaura [name], PT, DPT Acute Rehabilitation Services Pager: [phone]

## 2017-05-05 NOTE — Progress Notes (Signed)
Neurosurgery agreed to cancel stress test at this time. Cardiology paged to be made aware.

## 2017-05-05 NOTE — Progress Notes (Signed)
Patient ID: Brendan [name], male   DOB: 03/03/1941, 77 y.o.   MRN: [id number] Vital signs are stable Patient is minimally verbalizing but he did speak to me Notes that he feels poorly with some abdominal discomfort but no nausea His dressings are clean and dry He is exhibiting a good degree of tremor today Is somewhat confused still and he has been noted to be pulling at his lines in his sheets His wife and daughter are present I noted the results of the MRI Does have pneumocephalus over the right calvarium Continue to observe his situation If his confusion doesn't clear by tomorrow Will likely repeat a CT scan.

## 2017-05-06 ENCOUNTER — Inpatient Hospital Stay (HOSPITAL_COMMUNITY): Payer: PPO

## 2017-05-06 LAB — BASIC METABOLIC PANEL
Anion gap: 14 (ref 5–15)
BUN: 16 mg/dL (ref 6–20)
CO2: 20 mmol/L — ABNORMAL LOW (ref 22–32)
Calcium: 8.8 mg/dL — ABNORMAL LOW (ref 8.9–10.3)
Chloride: 102 mmol/L (ref 101–111)
Creatinine, Ser: 1.17 mg/dL (ref 0.61–1.24)
GFR calc Af Amer: >60 mL/min (ref >60)
GFR calc non Af Amer: 60 mL/min — ABNORMAL LOW (ref >60)
Glucose, Bld: 152 mg/dL — ABNORMAL HIGH (ref 65–99)
Potassium: 4.3 mmol/L (ref 3.5–5.1)
Sodium: 136 mmol/L (ref 135–145)

## 2017-05-06 MED ORDER — LABETALOL HCL 5 MG/ML IV SOLN
10.0000 mg | INTRAVENOUS | Status: DC | PRN
  Administered 2017-05-06 (×2): 10 mg via INTRAVENOUS
  Filled 2017-05-06 (×2): qty 4

## 2017-05-06 NOTE — Progress Notes (Signed)
Patient ID: Brendan [name], male   DOB: 06/24/1940, 77 y.o.   MRN: [id number]  I have reviewed the [facility] head [facility].  He has a small amount of pneumocephalus on the right.  He has a small right subdural hematoma.  He has a small intracerebral hemorrhage along the insertion site of the left electrode.  There is no significant mass effect.  I have discussed this with the patient's daughter and recommended observation.  I have answered all her questions.

## 2017-05-06 NOTE — Progress Notes (Signed)
Inpatient Rehabilitation  Per PT/OT request, patient was screened by Abrielle Finck for appropriateness for an Inpatient Acute Rehab consult.  At this time we are recommending an Inpatient Rehab consult.  Please order if you are agreeable.    Jazzalynn Rhudy, M.A., CCC/SLP Admission Coordinator  [facility]  Cell [phone]  

## 2017-05-06 NOTE — Progress Notes (Addendum)
Subjective: The patient continues to be confused.  He will not take p.o. medications.  His daughter is at the bedside.  Objective: Vital signs in last 24 hours: Temp:  [98.4 F (36.9 C)-101.1 F (38.4 C)] 99.1 F (37.3 C) (01/20 0748) Pulse Rate:  [89-98] 95 (01/20 0748) Resp:  [16] 16 (01/19 1500) BP: (152-176)/(81-98) 176/98 (01/20 0748) SpO2:  [96 %-97 %] 97 % (01/20 0748) Estimated body mass index is 34.06 kg/m as calculated from the following:   Height as of this encounter: 5\' 6"  (1.676 m).   Weight as of this encounter: 95.7 kg (211 lb).   Intake/Output from previous day: 01/19 0701 - 01/20 0700 In: 140 [P.O.:140] Out: -  Intake/Output this shift: No intake/output data recorded.  Physical exam the patient is alert and pleasant.  He is a bit confused.  He has a left tremor.  His scalp dressing has some drainage.  Lab Results: No results for input(s): WBC, HGB, HCT, PLT in the last 72 hours. BMET No results for input(s): NA, K, CL, CO2, GLUCOSE, BUN, CREATININE, CALCIUM in the last 72 hours.  Studies/Results: Mr Brain Wo Contrast  Addendum Date: 05/04/2017   ADDENDUM REPORT: 05/04/2017 14:03 ADDENDUM: Study discussed by telephone with Dr. Lurena JoinerEBECCA TAT on 05/04/2017 at 14:03. Electronically Signed   By: Odessa FlemingH  Hall M.D.   On: 05/04/2017 14:03   Result Date: 05/04/2017 CLINICAL DATA:  77 year old male status post deep brain stimulator placement. EXAM: MRI HEAD WITHOUT CONTRAST TECHNIQUE: Multiplanar, multiecho pulse sequences of the brain and surrounding structures were obtained without intravenous contrast. COMPARISON:  Preoperative head CT 04/26/2017. Preoperative brain MRIs 02/22/2017 and earlier. FINDINGS: Brain: Bilateral vertex approach deep brain stimulator probes track to the bilateral thalamic/subthalamic regions (series 5, image 45). No hemorrhage or abnormality identified along the course of either probe. There is new extra-axial decreased T1 and T2* signal along the  anterior right frontal lobe, favor pneumocephalus. Mild associated regional mass effect. No other intracranial mass or mass effect. Normal basilar cisterns. Six Stable parenchymal T1 signal throughout the brain. Small chronic microhemorrhage in the medial left occipital lobe Re demonstrated on series 2, image 8. Stable ventricle size and configuration. Skull and upper cervical spine: Postoperative changes to the vertex, with fiducials still in place. Sinuses/Orbits: Partially visible, negative. IMPRESSION: 1. Bilateral thalamic/subthalamic deep brain stimulator electrodes in place with no adverse features. 2. Unilateral right anterior frontal lobe pneumocephalus suspected. No significant intracranial mass effect. Electronically Signed: By: Odessa FlemingH  Hall M.D. On: 05/04/2017 13:59    Assessment/Plan: Postop day #2: I will check a follow-up head CT to see if his pneumocephalus has resolved.  I will also check a BMP to rule out hyponatremia  Hypertension: I will add labetalol since he will not take p.o. medications.  I have answered all the daughter's questions.    LOS: 2 days     Cristi LoronJeffrey D Lynia Landry 05/06/2017, 9:05 AM     Patient ID: Brendan BoJerry W Haven, male   DOB: 12/07/1940, 77 y.o.   MRN: [id number]

## 2017-05-06 NOTE — Evaluation (Signed)
Physical Therapy Evaluation Patient Details Name: Brendan [name] MRN: [id number] DOB: 1941/02/24 Today's Date: 05/06/2017   History of Present Illness  Admitted with essential tremor; now s/p Bilateral Deep brain stimulator placement. Recovery course complicated by confusion. CT 05/06/17 revealed small amount of pneumocephalus on the R, small R subdural hematoma, small intracerebral hemorrhage along the insertion site of the L electrode.  Has a pertinent past medical history of Anxiety disorder, Benign enlargement of prostate, CAD in native artery (1997), Cervical spinal stenosis, Complex regional pain syndrome of right upper extremity, Coronary atherosclerosis of artery bypass graft (June 2013), Gait disorder, , Memory loss, Peripheral neuropathy, S/P CABG x 3 (2004), Tremor, essential, and Unstable angina pectoris [facility]) ( June 2013).  Clinical Impression   Patient is s/p above surgery resulting in functional limitations due to the deficits listed below (see PT Problem List). He has progressively required increasing assistance with functional ADL due to B UE tremor. Post-operatively, pt presents with apraxia and  confusion and will attend to visual stimuli to the L and will respond intermittently with appropriate one-word answers to questions. Pt remains tangential and will initiate an unrelated sentence only to follow it up with mumbling. He was able to follow a single one-step command with gestural cues this session. Noted significant tremor in L UE and R LE and tightly fisted bilateral hands. Pt has decreased attention to R visual field and R UE along with R sided facial droop as well. Difficulty organizing and motor planning, especially when task requires weight shifting;  Patient will benefit from skilled PT to increase their independence and safety with mobility to allow discharge to the venue listed below.       Follow Up Recommendations CIR    Equipment Recommendations  Rolling walker with 5"  wheels;3in1 (PT)    Recommendations for Other Services Rehab consult     Precautions / Restrictions Precautions Precautions: Fall Precaution Comments: watch BP      Mobility  Bed Mobility Overal bed mobility: Needs Assistance Bed Mobility: Supine to Sit;Sit to Supine     Supine to sit: Min assist Sit to supine: Min assist   General bed mobility comments: Extra time taken; handheld assist to pull to sit; Therapist sat on bed and then stated "Come sit by me"; extra time to lay back down; difficulty motor planning to go from L sidelie back to supine  Transfers Overall transfer level: Needs assistance Equipment used: 2 person hand held assist Transfers: Sit to/from Stand Sit to Stand: +2 safety/equipment;Min assist;Mod assist         General transfer comment: Performed 3 sit<>stands from bed with level of assist varying min<>mod; close guard for safety; Once up, significantly impaired motor planning, difficulty reaching for chair and weight shifting, ultimately unable to get to chair with +2 assist and cues  Ambulation/Gait             General Gait Details: Attemtped, but ultimately unable  Stairs            Wheelchair Mobility    Modified Rankin (Stroke Patients Only)       Balance Overall balance assessment: Needs assistance Sitting-balance support: Feet supported;Single extremity supported;Bilateral upper extremity supported Sitting balance-Leahy Scale: Fair       Standing balance-Leahy Scale: Poor                               Pertinent Vitals/Pain Pain Assessment: Faces Faces  Pain Scale: No hurt Pain Intervention(s): Monitored during session    Home Living Family/patient expects to be discharged to:: Private residence Living Arrangements: Spouse/significant other Available Help at Discharge: Family;Available 24 hours/day             Additional Comments: Pt's daughter present but unsure of complete PLOF.     Prior  Function Level of Independence: Needs assistance   Gait / Transfers Assistance Needed: Family encourages use of RW but pt does not utilize.   ADL's / Homemaking Assistance Needed: Assist with self-feeding and ADL due to tremors.         Hand Dominance   Dominant Hand: Left    Extremity/Trunk Assessment   Upper Extremity Assessment Upper Extremity Assessment: Defer to OT evaluation RUE Deficits / Details: Noted decreased attention to R and movement of R UE. Tightly fisted grasp on washcloths in hand and unable to release.  LUE Deficits / Details: Pt with significant tremor and repetitively touching his face and reaching to touch therapist's face. Resists strongly even with light touch.    Lower Extremity Assessment Lower Extremity Assessment: (Noting motor apraxia and dyscoordination)       Communication   Communication: Receptive difficulties;Expressive difficulties  Cognition Arousal/Alertness: Awake/alert Behavior During Therapy: Restless(Pleasant) Overall Cognitive Status: Impaired/Different from baseline Area of Impairment: Orientation;Attention;Memory;Following commands                 Orientation Level: Disoriented to;Place;Time;Situation Current Attention Level: Focused Memory: Decreased short-term memory Following Commands: Follows one step commands inconsistently              General Comments      Exercises     Assessment/Plan    PT Assessment Patient needs continued PT services  PT Problem List Decreased strength;Decreased activity tolerance;Decreased range of motion;Decreased balance;Decreased mobility;Decreased coordination;Decreased cognition;Decreased knowledge of use of DME;Decreased safety awareness;Decreased knowledge of precautions       PT Treatment Interventions DME instruction;Gait training;Stair training;Functional mobility training;Therapeutic activities;Therapeutic exercise;Balance training;Neuromuscular re-education;Cognitive  remediation;Patient/family education    PT Goals (Current goals can be found in the Care Plan section)  Acute Rehab PT Goals Patient Stated Goal: per family to be functional PT Goal Formulation: With patient Time For Goal Achievement: 05/20/17 Potential to Achieve Goals: Good    Frequency Min 4X/week   Barriers to discharge        Co-evaluation               AM-PAC PT "6 Clicks" Daily Activity  Outcome Measure Difficulty turning over in bed (including adjusting bedclothes, sheets and blankets)?: A Little Difficulty moving from lying on back to sitting on the side of the bed? : A Lot Difficulty sitting down on and standing up from a chair with arms (e.g., wheelchair, bedside commode, etc,.)?: Unable Help needed moving to and from a bed to chair (including a wheelchair)?: A Lot Help needed walking in hospital room?: A Lot Help needed climbing 3-5 steps with a railing? : Total 6 Click Score: 11    End of Session   Activity Tolerance: Patient tolerated treatment well Patient left: in bed;with call bell/phone within reach;with bed alarm set;with family/visitor present Nurse Communication: Mobility status PT Visit Diagnosis: Unsteadiness on feet (R26.81);Other abnormalities of gait and mobility (R26.89);Apraxia (R48.2);Other symptoms and signs involving the nervous system (R29.898)    Time: 1610-9604 PT Time Calculation (min) (ACUTE ONLY): 16 min   Charges:   PT Evaluation $PT Eval Moderate Complexity: 1 Mod     PT  G Codes:        Van ClinesHolly Linzy Darling, PT  Acute Rehabilitation Services Pager [phone] Office [phone]   Levi AlandHolly H Shalece Staffa 05/06/2017, 5:01 PM

## 2017-05-06 NOTE — Evaluation (Signed)
Occupational Therapy Evaluation Patient Details Name: Brendan [name] MRN: [id number] DOB: 1941-02-14 Today's Date: 05/06/2017    History of Present Illness Admitted with essential tremor; now s/p Bilateral Deep brain stimulator placement. Recovery course complicated by confusion. CT 05/06/17 revealed small amount of pneumocephalus on the R, small R subdural hematoma, small intracerebral hemorrhage along the insertion site of the L electrode.  Has a pertinent past medical history of Anxiety disorder, Benign enlargement of prostate, CAD in native artery (1997), Cervical spinal stenosis, Complex regional pain syndrome of right upper extremity, Coronary atherosclerosis of artery bypass graft (June 2013), Gait disorder, , Memory loss, Peripheral neuropathy, S/P CABG x 3 (2004), Tremor, essential, and Unstable angina pectoris [facility]) ( June 2013).   Clinical Impression   PTA, pt was living with his wife. He has progressively required increasing assistance with functional ADL due to B UE tremor. Post-operatively, pt remains confused and will attend to visual stimuli to the L and will respond intermittently with appropriate one-word answers to questions. Pt remains tangential and will initiate an unrelated sentence only to follow it up with mumbling. He was able to follow a single one-step command with gestural cues this session. Noted significant tremor in L UE and R LE and tightly fisted bilateral hands. Pt has decreased attention to R visual field and R UE along with R sided facial droop as well. Pt additionally resists all tactile stimulation or hand over hand assistance this session. Educated pt's daughter concerning encouragement of attention to R visual field and use of hands when pt is engaged. He would benefit from continued OT services while admitted to improve functional participation in ADL in the setting of the above stated deficits. Recommend CIR level therapies to maximize return to functional PLOF  once medically stable.     Follow Up Recommendations  CIR;Supervision/Assistance - 24 hour    Equipment Recommendations  Other (comment)(TBD at next venue of care)    Recommendations for Other Services Rehab consult     Precautions / Restrictions Precautions Precautions: Fall Precaution Comments: watch BP      Mobility Bed Mobility               General bed mobility comments: Pt reporting agreeable to sit at EOB but heavily resisting movement of B LE assuming tight extension and bridging off of bed.   Transfers                 General transfer comment: unsafe to attempt    Balance Overall balance assessment: (unable to assess)                                         ADL either performed or assessed with clinical judgement   ADL Overall ADL's : Needs assistance/impaired                                       General ADL Comments: Total assistance for all ADL at this time.      Vision   Vision Assessment?: Vision impaired- to be further tested in functional context Additional Comments: Unable to track past midline to the R. Noted decreased attention to R visual field.      Perception     Praxis      Pertinent Vitals/Pain Pain Assessment: Faces(unable to  report) Pain Score: 0-No pain     Hand Dominance Left   Extremity/Trunk Assessment Upper Extremity Assessment Upper Extremity Assessment: RUE deficits/detail;LUE deficits/detail RUE Deficits / Details: Noted decreased attention to R and movement of R UE. Tightly fisted grasp on washcloths in hand and unable to release.  LUE Deficits / Details: Pt with significant tremor and repetitively touching his face and reaching to touch therapist's face. Resists strongly even with light touch.           Communication Communication Communication: Receptive difficulties;Expressive difficulties   Cognition Arousal/Alertness: Awake/alert Behavior During Therapy:  Agitated;Restless Overall Cognitive Status: Difficult to assess Area of Impairment: Orientation;Attention;Memory;Following commands                 Orientation Level: Disoriented to;Place;Time;Situation Current Attention Level: Focused Memory: Decreased short-term memory Following Commands: (followed single one-step command with gestural cue)       General Comments: Pt mumbling and will start sentences but does not complete thoughts or speak on related topics. Followed one command and does immitate gestures.    General Comments  Pt's daughter present and highly engaged in session today.     Exercises     Shoulder Instructions      Home Living Family/patient expects to be discharged to:: Private residence Living Arrangements: Spouse/significant other Available Help at Discharge: Family;Available 24 hours/day                             Additional Comments: Pt's daughter present but unsure of complete PLOF.       Prior Functioning/Environment Level of Independence: Needs assistance  Gait / Transfers Assistance Needed: Family encourages use of RW but pt does not utilize.  ADL's / Homemaking Assistance Needed: Assist with self-feeding and ADL due to tremors.             OT Problem List: Decreased strength;Decreased range of motion;Decreased activity tolerance;Impaired balance (sitting and/or standing);Decreased safety awareness;Decreased knowledge of use of DME or AE;Decreased knowledge of precautions;Cardiopulmonary status limiting activity;Impaired vision/perception;Decreased coordination;Decreased cognition      OT Treatment/Interventions: Self-care/ADL training;Therapeutic exercise;Energy conservation;DME and/or AE instruction;Therapeutic activities;Cognitive remediation/compensation;Patient/family education;Balance training    OT Goals(Current goals can be found in the care plan section) Acute Rehab OT Goals Patient Stated Goal: per family to be  functional OT Goal Formulation: With family Time For Goal Achievement: 05/20/17 Potential to Achieve Goals: Good ADL Goals Pt Will Perform Eating: with max assist;bed level Pt Will Perform Grooming: with max assist;bed level Additional ADL Goal #1: Pt will follow 2/3 one-step commands with 3 gestural cues in a non-distracting environment. Additional ADL Goal #2: Pt will attend to R visual field with 1 tactile cue when presented with preferred food.  OT Frequency: Min 3X/week   Barriers to D/C: Decreased caregiver support          Co-evaluation              AM-PAC PT "6 Clicks" Daily Activity     Outcome Measure Help from another person eating meals?: Total Help from another person taking care of personal grooming?: Total Help from another person toileting, which includes using toliet, bedpan, or urinal?: Total Help from another person bathing (including washing, rinsing, drying)?: Total Help from another person to put on and taking off regular upper body clothing?: Total Help from another person to put on and taking off regular lower body clothing?: Total 6 Click Score: 6   End of  Session Nurse Communication: Mobility status  Activity Tolerance: Other (comment)(limited due to cognition and safety) Patient left: in bed;with call bell/phone within reach;with family/visitor present;with nursing/sitter in room  OT Visit Diagnosis: Other abnormalities of gait and mobility (R26.89);Ataxia, unspecified (R27.0);Muscle weakness (generalized) (M62.81);Other symptoms and signs involving cognitive function                Time: 1120-1200 OT Time Calculation (min): 40 min Charges:  OT General Charges $OT Visit: 1 Visit OT Evaluation $OT Eval High Complexity: 1 High OT Treatments $Self Care/Home Management : 23-37 mins G-Codes:     Doristine Section, MS OTR/L  Pager: [phone]   Taijah Macrae A Crystelle Ferrufino 05/06/2017, 1:00 PM

## 2017-05-06 NOTE — Progress Notes (Signed)
Order obtained from neuro MD to change dressing to cranium. Current dressing is saturated with old bleed.

## 2017-05-07 ENCOUNTER — Telehealth: Payer: Self-pay | Admitting: Neurology

## 2017-05-07 DIAGNOSIS — R339 Retention of urine, unspecified: Secondary | ICD-10-CM

## 2017-05-07 DIAGNOSIS — S06350A Traumatic hemorrhage of left cerebrum without loss of consciousness, initial encounter: Secondary | ICD-10-CM

## 2017-05-07 DIAGNOSIS — F09 Unspecified mental disorder due to known physiological condition: Secondary | ICD-10-CM

## 2017-05-07 DIAGNOSIS — I2510 Atherosclerotic heart disease of native coronary artery without angina pectoris: Secondary | ICD-10-CM

## 2017-05-07 DIAGNOSIS — Z01818 Encounter for other preprocedural examination: Secondary | ICD-10-CM

## 2017-05-07 DIAGNOSIS — H409 Unspecified glaucoma: Secondary | ICD-10-CM

## 2017-05-07 DIAGNOSIS — S06350D Traumatic hemorrhage of left cerebrum without loss of consciousness, subsequent encounter: Secondary | ICD-10-CM

## 2017-05-07 DIAGNOSIS — I25119 Atherosclerotic heart disease of native coronary artery with unspecified angina pectoris: Secondary | ICD-10-CM

## 2017-05-07 DIAGNOSIS — F411 Generalized anxiety disorder: Secondary | ICD-10-CM

## 2017-05-07 MED ORDER — UNJURY CHICKEN SOUP POWDER
1.0000 | Freq: Three times a day (TID) | ORAL | Status: DC
  Administered 2017-05-09: 1 via ORAL
  Filled 2017-05-07 (×10): qty 27

## 2017-05-07 MED ORDER — TAMSULOSIN HCL 0.4 MG PO CAPS
0.4000 mg | ORAL_CAPSULE | Freq: Every day | ORAL | Status: DC
  Administered 2017-05-07 – 2017-05-10 (×4): 0.4 mg via ORAL
  Filled 2017-05-07 (×4): qty 1

## 2017-05-07 MED FILL — Thrombin For Soln 5000 Unit: CUTANEOUS | Qty: 5000 | Status: AC

## 2017-05-07 NOTE — Care Management Important Message (Signed)
Important Message  Patient Details  Name: Brendan [name] MRN: [id number] Date of Birth: 04/10/1941   Medicare Important Message Given:  Yes    Glennon Macmerson, Karleen Seebeck M, RN 05/07/2017, 5:14 PM

## 2017-05-07 NOTE — Consult Note (Signed)
Physical Medicine and Rehabilitation Consult   Reason for Consult: Functional deficits Referring Physician: Dr. [name].    HPI: Brendan [name] is a 77 y.o. male with history of CAD, glaucoma, anxiety disorder, mild cognitive disorder, essential tremors X 25 years with gait disorder who was admitted on 05/04/17 for placement of DBS by Dr. [name]. History taken from chart review and family.  Post op course significant for agitation, poor po intake and lethargy. PT evaluation done showing confusion with tangential speech, right inattention with right facial droop and difficulty following basic commands without cues. CT head reviewed, showing left hemorrhage. Per report, 2.5 cm area of parenchymal hemorrhage along posterior course of DBA at junction of left middle and superior frontal gyri. Has required I/O cath due to urinary retention. CIR recommended due to functional decline.    At baseline wife provided supervision and assistance with ADLs. He was sedentary--sat in recliner all day but could walk household distances-- furniture/wall walker as did not like AD. Family feels tremors at baseline currently. Family provides supervision.   Review of Systems  Unable to perform ROS: Mental acuity   Past Medical History:  Diagnosis Date  . Anxiety disorder   . Benign enlargement of prostate   . CAD in native artery 1997   Referred for CABG x3 in 2004 for LAD diagonal bifurcation lesion  . Cervical spinal stenosis   . Complex regional pain syndrome of right upper extremity    Right wrist; L arm  . Coronary atherosclerosis of artery bypass graft June 2013   Occluded SVG-D1; Cardiologist Dr. Herbie Baltimore  . Gait disorder   . Gastroesophageal reflux disease   . Glaucoma   . Hiatal hernia    GI: Dr Evette Cristal  . Hyperlipidemia LDL goal <70   . Hypertension   . Memory loss    Mild  . Obesity   . Peripheral neuropathy    possible peripheral neuropathy  . S/P CABG x 3 2004    LIMA-LAD, SVG to  diagonal, SVG to OM  . Tremor, essential    On Primidone  . Unstable angina pectoris [facility])  June 2013   Cardiac cath: occluded SVG-DI. Patent LIMA-LAD and SVG-OM. EF 45% with apical inferior HK.    Past Surgical History:  Procedure Laterality Date  . CARDIAC CATHETERIZATION    . CATARACT EXTRACTION, BILATERAL    . CORONARY ARTERY BYPASS GRAFT  06/2002   LIMA-LAD, SVG-D1, SVG-OM  . EYE SURGERY    . KNEE SURGERY Left   . LEFT HEART CATHETERIZATION WITH CORONARY/GRAFT ANGIOGRAM N/A 10/13/2011   Procedure: LEFT HEART CATHETERIZATION WITH Isabel Caprice;  Surgeon: Marykay Lex, MD;  Location: [facility] CATH LAB;  Service: Cardiovascular;  Laterality: N/A;  . MINOR PLACEMENT OF FIDUCIAL N/A 04/26/2017   Procedure: Fiducial placement;  Surgeon: Maeola Harman, MD;  Location: [facility] OR;  Service: Neurosurgery;  Laterality: N/A;  Fiducial placement  . NM MYOCAR PERF WALL MOTION  03/23/2009   protocol:Bruce, normal perfusion in all regions, post-stress EF 72%, exercise capacity , EKG negative for ischemia.  . Shoulder Orthoscopic Surgery   06/2003  . TRANSTHORACIC ECHOCARDIOGRAM  April 2014   EF 55-60%; no regional wall motion abnormalities. Relatively normal study normal diastolic parameters/abnormal relaxation  . wrist ganglion cyst Left     Family History  Problem Relation Age of Onset  . Heart disease Mother   . Heart disease Brother   . Tremor Paternal Uncle   . Heart disease Brother   .  Drug abuse Daughter   . Hepatitis C Daughter     Social History:  Married. Independent but sedentary PTA. Home bound. Furniture/wall walks at home. Per reports that he quit smoking about 49 years ago. His smoking use included cigarettes. He has a 60.00 pack-year smoking history. he has never used smokeless tobacco. Per reports that he does not drink alcohol or use drugs.    Allergies  Allergen Reactions  . Lisinopril Cough    Medications Prior to Admission  Medication Sig Dispense Refill    . atorvastatin (LIPITOR) 40 MG tablet Take 1 tablet (40 mg total) by mouth daily. 90 tablet 3  . furosemide (LASIX) 20 MG tablet Take 1 tablet (20 mg total) by mouth daily. May take an  Extra dose if needed (Patient taking differently: Take 20 mg by mouth See admin instructions. Take 20 mg by mouth daily. May take an extra 20 mg if needed for fluid) 35 tablet 11  . HYDROcodone-acetaminophen (NORCO/VICODIN) 5-325 MG tablet Take 1 tablet by mouth every 6 (six) hours as needed for moderate pain.    [name] ketoconazole (NIZORAL) 2 % shampoo Apply 1 application topically 2 (two) times a week. (Patient taking differently: Apply 1 application topically once a week. ) 120 mL 1  . loratadine (CLARITIN) 10 MG tablet Take 1 tablet (10 mg total) by mouth daily. 30 tablet 11  . losartan (COZAAR) 100 MG tablet Take 1 tablet (100 mg total) by mouth daily. 90 tablet 3  . neomycin-bacitracin-polymyxin (NEOSPORIN) ointment Apply 1 application topically as needed for wound care.    . nitroGLYCERIN (NITROSTAT) 0.4 MG SL tablet Place 1 tablet (0.4 mg total) under the tongue every 5 (five) minutes as needed for chest pain. call 911 if chest pain not better 30 tablet 3  . omeprazole (PRILOSEC) 20 MG capsule Take 1 capsule (20 mg total) by mouth daily as needed (acid reflux). 90 capsule 3  . propranolol (INDERAL) 80 MG tablet Take 1 tablet (80 mg total) by mouth 2 (two) times daily. 180 tablet 5  . Propylene Glycol (SYSTANE BALANCE) 0.6 % SOLN Place 1 drop into both eyes daily as needed (for dry eyes).    [name] aspirin (LONGS ADULT LOW STRENGTH ASA) 81 MG EC tablet Take 81 mg by mouth daily.      . fish oil-omega-3 fatty acids 1000 MG capsule Take 1 g by mouth 2 (two) times daily.    [name] gabapentin (NEURONTIN) 300 MG capsule Take 2 capsules (600 mg total) by mouth 3 (three) times daily. (Patient not taking: Reported on 05/03/2017) 180 capsule 5  . primidone (MYSOLINE) 50 MG tablet 04/19/17: 250 mg am, 200 mg hs; 04/20/17: 200 mg am, 200 mg  hs, 04/21/17: 200 mg am, 150 mg hs; 1/6-1/7: 150 mg BID; 1/8-1/9: 150 mg am, 100 mg hs; 1/10-1/11: 100 mg BID; 1/12-1/13: 100 mg am, 50 mg hs; 1/14: 50 mg BID, 1/15: 50 mg QD, 1/16: Stop 60 tablet 0    Home: Home Living Family/patient expects to be discharged to:: Private residence Living Arrangements: Spouse/significant other Available Help at Discharge: Family, Available 24 hours/day Additional Comments: Pt's daughter present but unsure of complete PLOF.   Functional History: Prior Function Level of Independence: Needs assistance Gait / Transfers Assistance Needed: Family encourages use of RW but pt does not utilize.  ADL's / Homemaking Assistance Needed: Assist with self-feeding and ADL due to tremors.  Functional Status:  Mobility: Bed Mobility Overal bed mobility: Needs Assistance Bed Mobility: Supine  to Sit, Sit to Supine Supine to sit: Min assist Sit to supine: Min assist General bed mobility comments: Extra time taken; handheld assist to pull to sit; Therapist sat on bed and then stated "Come sit by me"; extra time to lay back down; difficulty motor planning to go from L sidelie back to supine Transfers Overall transfer level: Needs assistance Equipment used: 2 person hand held assist Transfers: Sit to/from Stand Sit to Stand: +2 safety/equipment, Min assist, Mod assist General transfer comment: Performed 3 sit<>stands from bed with level of assist varying min<>mod; close guard for safety; Once up, significantly impaired motor planning, difficulty reaching for chair and weight shifting, ultimately unable to get to chair with +2 assist and cues Ambulation/Gait General Gait Details: Attemtped, but ultimately unable    ADL: ADL Overall ADL's : Needs assistance/impaired General ADL Comments: Total assistance for all ADL at this time.   Cognition: Cognition Overall Cognitive Status: Impaired/Different from baseline Orientation Level: Oriented to person, Disoriented to place,  Disoriented to time, Disoriented to situation Cognition Arousal/Alertness: Awake/alert Behavior During Therapy: Restless(Pleasant) Overall Cognitive Status: Impaired/Different from baseline Area of Impairment: Orientation, Attention, Memory, Following commands Orientation Level: Disoriented to, Place, Time, Situation Current Attention Level: Focused Memory: Decreased short-term memory Following Commands: Follows one step commands inconsistently General Comments: Pt mumbling and will start sentences but does not complete thoughts or speak on related topics. Followed one command and does immitate gestures.  Difficult to assess due to: Impaired communication   Blood pressure 139/74, pulse 78, temperature 97.7 F (36.5 C), temperature source Oral, resp. rate 19, height 5\' 6"  (1.676 m), weight 95.7 kg (211 lb), SpO2 100 %. Physical Exam  Nursing note and vitals reviewed. Constitutional: He appears well-developed and well-nourished.  HENT:  Head: Normocephalic and atraumatic.  Mouth/Throat: Oropharynx is clear and moist.  Dry dressing on scalp  Eyes: Conjunctivae and EOM are normal. Pupils are equal, round, and reactive to light.  Neck: Normal range of motion. Neck supple.  Cardiovascular: Normal rate and regular rhythm.  Respiratory: Effort normal. He has decreased breath sounds. He has no wheezes.  GI: Soft. Bowel sounds are normal. He exhibits no distension. There is no tenderness.  Musculoskeletal: He exhibits no edema or tenderness.  Neurological: He is alert.  Right inattention but able to turn to the right with cues. Extremely delayed with limited verbal output.  Able to state name.  He was unable to follow simple commands without verbal and tactile cues.  Significant resting and intention tremors LUE.  Apraxia throughout Motor: ?RUE/RLE 5/5 proximal to distal LUE: Limited by tremor LLE: ?5/5 proximal to distal Right facial droop  Skin: Skin is warm and dry.  Psychiatric:  His speech is delayed and slurred. He is slowed. Cognition and memory are impaired. He expresses inappropriate judgment.    Results for orders placed or performed during the hospital encounter of 05/04/17 (from the past 24 hour(s))  Basic metabolic panel     Status: Abnormal   Collection Time: 05/06/17 11:50 AM  Result Value Ref Range   Sodium 136 135 - 145 mmol/L   Potassium 4.3 3.5 - 5.1 mmol/L   Chloride 102 101 - 111 mmol/L   CO2 20 (L) 22 - 32 mmol/L   Glucose, Bld 152 (H) 65 - 99 mg/dL   BUN 16 6 - 20 mg/dL   Creatinine, Ser 1.61 0.61 - 1.24 mg/dL   Calcium 8.8 (L) 8.9 - 10.3 mg/dL   GFR calc non Af Amer 60 (  L) >60 mL/min   GFR calc Af Amer >60 >60 mL/min   Anion gap 14 5 - 15   Ct Head Wo Contrast  Addendum Date: 05/06/2017   ADDENDUM REPORT: 05/06/2017 11:32 ADDENDUM: Critical Value/emergent results were called by telephone at the time of interpretation on 05/06/2017 at 1105 hours to Dr. Tressie Stalker , who verbally acknowledged these results. Electronically Signed   By: Odessa Fleming M.D.   On: 05/06/2017 11:32   Result Date: 05/06/2017 CLINICAL DATA:  77 year old male status post deep brain stimulator placement postoperative day 2. EXAM: CT HEAD WITHOUT CONTRAST TECHNIQUE: Contiguous axial images were obtained from the base of the skull through the vertex without intravenous contrast. COMPARISON:  Post DBS brain MRI 05/04/2017. Preoperative head CT 04/26/2017, and earlier. FINDINGS: Brain: Small volume pneumocephalus scattered at the vertex bilaterally and along the anterior frontal convexities, more so on the right. Minimal associated mass effect. A rounded area of parenchymal hemorrhage has developed along the posterior course of the left DBS at the junction of the left middle and superior frontal gyri measuring about 20 x 22 x 25 millimeters (AP by transverse by CC) for an estimated blood volume of 5-6 milliliters. This is at the site of mild focal T1 hypointensity along the stimulator  lead on the postoperative MRI (series 5, image 88 of that exam) which was not thought to reflect hemorrhage at that time. Trace surrounding edema. Minimal regional mass effect. No extra-axial or intraventricular hemorrhage identified. Gray-white matter differentiation elsewhere is stable and within normal limits. No midline shift or ventriculomegaly. Basilar cisterns remain patent. Vascular: Calcified atherosclerosis at the skull base. No suspicious intracranial vascular hyperdensity. Skull: The anterior and posterior fiducials have been removed. Bilateral superior convexity burr holes placed. Skull otherwise remains intact. 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 Associated advanced degenerative changes at the craniocervical junction. Sinuses/Orbits: Paranasal sinuses and mastoids are stable and well pneumatized. Other: Postoperative changes to the bilateral superior and lateral scalp soft tissues with stimulator electrodes in place, overlying scalp skin staples. Small volume generalized scalp hematoma with scattered occasional soft tissue gas. Hematoma tracking inferiorly mostly along the left face and into the bilateral suboccipital soft tissues. Orbits soft tissues appear stable and negative. Negative visible noncontrast deep soft tissue spaces of the face. IMPRESSION: 1. Small round 2.5 cm area of parenchymal hemorrhage along the posterior course of the left DBS at the junction of the left middle and superior frontal gyri. Estimated blood volume 5-6 milliliters. Minimal regional mass effect, and no extra-axial or intraventricular extension of blood. This corresponds to an area of mild focal T1 hypointensity on the postoperative brain MRI, which was thought to be mild postoperative edema and not hematoma. 2. Persistent small volume pneumocephalus scattered at the vertex and along the frontal convexities, more so on the right. Minimal associated mass effect. Electronically  Signed: By: Odessa Fleming M.D. On: 05/06/2017 10:50    Assessment/Plan: Diagnosis:  Parenchymal hemorrhage of left middle and superior frontal gyri Labs and images independently reviewed.  Records reviewed and summated above. Stroke: Continue secondary stroke prophylaxis and Risk Factor Modification listed below:   Blood Pressure Management:  Continue current medication with prn's with permisive HTN per primary team  1. Does the need for close, 24 hr/day medical supervision in concert with the patient's rehab needs make it unreasonable for this patient to be served in a less intensive setting? Potentially  2. Co-Morbidities requiring supervision/potential complications: CAD (cont meds), glaucoma,  anxiety (ensure anxiety and resulting apprehension do not limit functional progress; consider prn medications if warranted), mild cognitive disorder, essential tremors (recs per Neuro), urinary retention (cont I/O caths) 3. Due to bladder management, safety, skin/wound care, disease management, medication administration, pain management and patient education, does the patient require 24 hr/day rehab nursing? Yes 4. Does the patient require coordinated care of a physician, rehab nurse, PT (1-2 hrs/day, 5 days/week), OT (1-2 hrs/day, 5 days/week) and SLP (1-2 hrs/day, 5 days/week) to address physical and functional deficits in the context of the above medical diagnosis(es)? Potentially Addressing deficits in the following areas: balance, endurance, locomotion, strength, transferring, bathing, dressing, toileting, cognition, speech, language and psychosocial support 5. Can the patient actively participate in an intensive therapy program of at least 3 hrs of therapy per day at least 5 days per week? Yes 6. The potential for patient to make measurable gains while on inpatient rehab is fair 7. Anticipated functional outcomes upon discharge from inpatient rehab are supervision and min assist  with PT, supervision and  min assist with OT, min assist with SLP. 8. Estimated rehab length of stay to reach the above functional goals is: 17-20 days. 9. Anticipated D/C setting: Home 10. Anticipated post D/C treatments: SNF 11. Overall Rehab/Functional Prognosis: fair  RECOMMENDATIONS: This patient's condition is appropriate for continued rehabilitative care in the following setting: Will need to inquire about details of baseline level of functioning - reported to be sedentary with furniture walking short distances.  Per family, pt appears to be near baseline, with the exception of cognition.  If this is the case, recommend SNF at present. Patient has agreed to participate in recommended program. Potentially Note that insurance prior authorization may be required for reimbursement for recommended care.  Comment: Rehab Admissions Coordinator to follow up.  Maryla MorrowAnkit Tonisha Silvey, MD, ABPMR Jacquelynn CreePamela S Love, PA-C 05/07/2017

## 2017-05-07 NOTE — Progress Notes (Signed)
Inpatient Rehabilitation  Met with patient, spouse, and daughters at bedside to discuss team's recommendation for IP Rehab.  Shared booklets and answered questions.  Per consult, clarified patient's baseline level of functioning, which was Supervision for gross motor tasks and Min A with fine motor tasks per spouse's report.  Observed with PT where he appeared to require heavy assist.  Additionally, patient managed things at home like the generator in case of loss of power, and had 1 fall within the last year while he was out in the snow checking on the generator.  Family is eager for patient to regain his maximal independence and return home with assist from spouse and daughters.  Plan to initiate insurance authorization.  Will follow for timing of medical readiness, insurance decision, and IP Rehab bed availability.  Call if questions.    Carmelia Roller., CCC/SLP Admission Coordinator  [place]  Cell [phone]

## 2017-05-07 NOTE — Progress Notes (Signed)
Physical Therapy Treatment Patient Details Name: Brendan [name] MRN: [id number] DOB: 01/19/1941 Today's Date: 05/07/2017    History of Present Illness Admitted with essential tremor; now s/p Bilateral Deep brain stimulator placement. Recovery course complicated by confusion. CT 05/06/17 revealed small amount of pneumocephalus on the R, small R subdural hematoma, small intracerebral hemorrhage along the insertion site of the L electrode.  Has a pertinent past medical history of Anxiety disorder, Benign enlargement of prostate, CAD in native artery (1997), Cervical spinal stenosis, Complex regional pain syndrome of right upper extremity, Coronary atherosclerosis of artery bypass graft (June 2013), Gait disorder, , Memory loss, Peripheral neuropathy, S/P CABG x 3 (2004), Tremor, essential, and Unstable angina pectoris [facility](HCC) ( June 2013).    PT Comments    Patient seen for activity progression. At this time, patient showing some improvements but currently not near baseline level of function. Patient continues to show deficits in gross movement patterns and requires increased physical assist (moderate 1 to 2 person assist) for upright mobility with manual facilitation of weight shift and cues for stride. At this time, continue to feel patient will require comprehensive intensive therapies to progress to baseline level of function. CIR remains appropriate.   Follow Up Recommendations  CIR     Equipment Recommendations  Rolling walker with 5" wheels;3in1 (PT)    Recommendations for Other Services Rehab consult     Precautions / Restrictions Precautions Precautions: Fall Precaution Comments: watch BP    Mobility  Bed Mobility Overal bed mobility: Needs Assistance Bed Mobility: Rolling;Supine to Sit Rolling: Min assist   Supine to sit: Min assist     General bed mobility comments: min assist to facilitate roll to left side, manual placement of RUE on left rail increased time and effort to  elevate trunk to upright at EOB  Transfers Overall transfer level: Needs assistance Equipment used: 2 person hand held assist Transfers: Sit to/from Stand Sit to Stand: +2 safety/equipment;Min assist         General transfer comment: min assist to power up to standing for stability  Ambulation/Gait Ambulation/Gait assistance: Mod assist Ambulation Distance (Feet): 22 Feet Assistive device: 2 person hand held assist Gait Pattern/deviations: Step-to pattern;Decreased stride length;Shuffle;Trunk flexed Gait velocity: decreased Gait velocity interpretation: <1.8 ft/sec, indicative of risk for recurrent falls General Gait Details: Patient required manual facilitation of weight shift with tactile cues for upright posture and VCs for increased stride. Heavy reliance on bilateral UE support to maintain balance   Stairs            Wheelchair Mobility    Modified Rankin (Stroke Patients Only)       Balance Overall balance assessment: Needs assistance Sitting-balance support: Feet supported;Single extremity supported;Bilateral upper extremity supported Sitting balance-Leahy Scale: Fair       Standing balance-Leahy Scale: Poor                              Cognition Arousal/Alertness: Awake/alert Behavior During Therapy: WFL for tasks assessed/performed Overall Cognitive Status: Impaired/Different from baseline Area of Impairment: Orientation;Attention;Memory;Following commands                 Orientation Level: Disoriented to;Place;Time;Situation Current Attention Level: Sustained Memory: Decreased short-term memory Following Commands: Follows one step commands consistently;Follows one step commands with increased time              Exercises      General Comments  Pertinent Vitals/Pain Pain Assessment: Faces Faces Pain Scale: No hurt Pain Intervention(s): Monitored during session    Home Living                       Prior Function            PT Goals (current goals can now be found in the care plan section) Acute Rehab PT Goals Patient Stated Goal: per family to be functional PT Goal Formulation: With patient Time For Goal Achievement: 05/20/17 Potential to Achieve Goals: Good Progress towards PT goals: Progressing toward goals    Frequency    Min 4X/week      PT Plan      Co-evaluation              AM-PAC PT "6 Clicks" Daily Activity  Outcome Measure  Difficulty turning over in bed (including adjusting bedclothes, sheets and blankets)?: A Little Difficulty moving from lying on back to sitting on the side of the bed? : A Lot Difficulty sitting down on and standing up from a chair with arms (e.g., wheelchair, bedside commode, etc,.)?: Unable Help needed moving to and from a bed to chair (including a wheelchair)?: A Lot Help needed walking in hospital room?: A Lot Help needed climbing 3-5 steps with a railing? : Total 6 Click Score: 11    End of Session Equipment Utilized During Treatment: Gait belt Activity Tolerance: Patient tolerated treatment well Patient left: in chair;with call bell/phone within reach;with family/visitor present Nurse Communication: Mobility status PT Visit Diagnosis: Unsteadiness on feet (R26.81);Other abnormalities of gait and mobility (R26.89);Apraxia (R48.2);Other symptoms and signs involving the nervous system (R29.898)     Time: 1610-9604 PT Time Calculation (min) (ACUTE ONLY): 20 min  Charges:  $Therapeutic Activity: 8-22 mins                    G Codes:       Brendan [name], PT DPT  Board Certified Neurologic Specialist [phone]    Brendan [name] 05/07/2017, 1:27 PM

## 2017-05-07 NOTE — Progress Notes (Signed)
Initial Nutrition Assessment  DOCUMENTATION CODES:   Obesity unspecified  INTERVENTION:  Provide Unjury 1 packet po TID, each supplement provides 100 kcal and 21 grams of protein.   Provide nourishment snacks. (Ordered)  Encourage adequate PO intake.   NUTRITION DIAGNOSIS:   Inadequate oral intake related to poor appetite as evidenced by per patient/family report.  GOAL:   Patient will meet greater than or equal to 90% of their needs  MONITOR:   PO intake, Supplement acceptance, Labs, I & O's, Weight trends, Skin  REASON FOR ASSESSMENT:   Low Braden    ASSESSMENT:    77 y.o. male with history of CAD, glaucoma, anxiety disorder, mild cognitive disorder, essential tremors X 25 years with gait disorder who was admitted on 05/04/17 for placement of DBS. CT head reviewed, showing left hemorrhage. Per report, 2.5 cm area of parenchymal hemorrhage along posterior course of DBA at junction of left middle and superior frontal gyri.  Procedure 1/18: Bilateral Deep brain stimulator placement (Bilateral)   Meal completion has been mostly 0-10% since admission. Pt reports no appetite and has no taste for food. Pt however with 100% at lunch today. Family has been encouraging PO intake. Weight has been stable per weight records. RD to order nutritional supplements to aid in caloric and protein needs as well as in healing. Pt reports dislike of sweet and milk-like supplements. RD to order Unjury. Pt additionally agreeable to nourishment snacks. RD to order. Family encouraged to brining in food from home or outside for pt to help with PO intake.   Labs and medications reviewed.   NUTRITION - FOCUSED PHYSICAL EXAM:    Most Recent Value  Orbital Region  No depletion  Upper Arm Region  Unable to assess  Thoracic and Lumbar Region  No depletion  Buccal Region  No depletion  Temple Region  No depletion  Clavicle Bone Region  No depletion  Clavicle and Acromion Bone Region  No depletion   Scapular Bone Region  Unable to assess  Dorsal Hand  No depletion  Patellar Region  Unable to assess  Anterior Thigh Region  Unable to assess  Posterior Calf Region  Unable to assess  Edema (RD Assessment)  None  Hair  Reviewed  Eyes  Reviewed  Mouth  Reviewed  Skin  Reviewed  Nails  Reviewed       Diet Order:  Diet regular Room service appropriate? Yes; Fluid consistency: Thin  EDUCATION NEEDS:   Not appropriate for education at this time  Skin:  Skin Assessment: Skin Integrity Issues: Skin Integrity Issues:: DTI, Incisions DTI: buttocks Incisions: head  Last BM:  Unknown  Height:   Ht Readings from Last 1 Encounters:  05/04/17 5\' 6"  (1.676 m)    Weight:   Wt Readings from Last 1 Encounters:  05/04/17 211 lb (95.7 kg)    Ideal Body Weight:  64.5 kg  BMI:  Body mass index is 34.06 kg/m.  Estimated Nutritional Needs:   Kcal:  1900-2100  Protein:  95-110 grams  Fluid:  1.9 - 2.1 L/day    Roslyn SmilingStephanie Claudell Wohler, MS, RD, LDN Pager # [phone] After hours/ weekend pager # [phone]

## 2017-05-07 NOTE — Progress Notes (Signed)
The patient has been seen in conjunction with Geoffry Paradise, NP-C. All aspects of care have been considered and discussed. The patient has been personally interviewed, examined, and all clinical data has been reviewed.   Technically unable to complete myocardial perfusion imaging due to tremor and motion that would make interpretation of essentially impossible.  Current clinical condition does not warrant coronary angiography.  Continue medical therapy.  Use nitroglycerin as needed for chest pain.  If surgery, will need to proceed, and assume that the patient is high risk for cardiac complications (i.e. greater than 5% risk of acute cardiac complications such as rhythm, volume overload, MI, etc.).  Please call us back if we can be of further assistance.  Progress Note  Patient Name: Brendan [name] Date of Encounter: 05/07/2017  Primary Cardiologist: Herbie Baltimore  Subjective   Feeling well this morning. Laying in bed with family at the bedside.   Inpatient Medications    Scheduled Meds: . atorvastatin  40 mg Oral q1800  . docusate sodium  100 mg Oral BID  . furosemide  20 mg Oral Daily  . loratadine  10 mg Oral Daily  . losartan  100 mg Oral Daily  . pantoprazole  40 mg Oral Daily  . pneumococcal 23 valent vaccine  0.5 mL Intramuscular Tomorrow-1000  . propranolol  80 mg Oral BID   Continuous Infusions: . dextrose 5 % and 0.45 % NaCl with KCl 20 mEq/L Stopped (05/05/17 0457)   PRN Meds: acetaminophen **OR** acetaminophen, bisacodyl, HYDROcodone-acetaminophen, HYDROcodone-acetaminophen, labetalol, menthol-cetylpyridinium **OR** phenol, morphine injection, nitroGLYCERIN, ondansetron **OR** ondansetron (ZOFRAN) IV, polyvinyl alcohol, senna-docusate, sodium phosphate, zolpidem   Vital Signs    Vitals:   05/06/17 2302 05/06/17 2330 05/07/17 0314 05/07/17 0745  BP: (!) 163/93 137/69 139/74   Pulse: 96  78   Resp: 18  19   Temp: 99.4 F (37.4 C)  99 F (37.2 C) 97.7 F  (36.5 C)  TempSrc: Axillary  Axillary Oral  SpO2:   100%   Weight:      Height:        Intake/Output Summary (Last 24 hours) at 05/07/2017 1141 Last data filed at 05/07/2017 0800 Gross per 24 hour  Intake 340 ml  Output 1100 ml  Net -760 ml   Filed Weights   05/04/17 1610 05/04/17 0639  Weight: 211 lb (95.7 kg) 211 lb (95.7 kg)    Telemetry    N/a - Personally Reviewed  Physical Exam   General: Pale older W male appearing in no acute distress. Head: Normocephalic, atraumatic.  Neck: Supple, no JVD. Lungs:  Resp regular and unlabored, CTA. Heart: RRR, S1, S2, no S3, S4, or murmur; no rub. Abdomen: Soft, non-tender, non-distended with normoactive bowel sounds.  Extremities: No clubbing, cyanosis, edema. Distal pedal pulses are 2+ bilaterally. Neuro: Alert and oriented to self. Moves all extremities spontaneously. Psych: Normal affect.  Labs    Chemistry Recent Labs  Lab 05/06/17 1150  NA 136  K 4.3  CL 102  CO2 20*  GLUCOSE 152*  BUN 16  CREATININE 1.17  CALCIUM 8.8*  GFRNONAA 60*  GFRAA >60  ANIONGAP 14     HematologyNo results for input(s): WBC, RBC, HGB, HCT, MCV, MCH, MCHC, RDW, PLT in the last 168 hours.  Cardiac EnzymesNo results for input(s): TROPONINI in the last 168 hours. No results for input(s): TROPIPOC in the last 168 hours.   BNPNo results for input(s): BNP, PROBNP in the last 168 hours.   DDimer No  results for input(s): DDIMER in the last 168 hours.    Radiology    Ct Head Wo Contrast  Addendum Date: 05/06/2017   ADDENDUM REPORT: 05/06/2017 11:32 ADDENDUM: Critical Value/emergent results were called by telephone at the time of interpretation on 05/06/2017 at 1105 hours to Dr. Tressie StalkerJEFFREY JENKINS , who verbally acknowledged these results. Electronically Signed   By: Odessa FlemingH  Hall M.D.   On: 05/06/2017 11:32   Result Date: 05/06/2017 CLINICAL DATA:  77 year old male status post deep brain stimulator placement postoperative day 2. EXAM: CT HEAD  WITHOUT CONTRAST TECHNIQUE: Contiguous axial images were obtained from the base of the skull through the vertex without intravenous contrast. COMPARISON:  Post DBS brain MRI 05/04/2017. Preoperative head CT 04/26/2017, and earlier. FINDINGS: Brain: Small volume pneumocephalus scattered at the vertex bilaterally and along the anterior frontal convexities, more so on the right. Minimal associated mass effect. A rounded area of parenchymal hemorrhage has developed along the posterior course of the left DBS at the junction of the left middle and superior frontal gyri measuring about 20 x 22 x 25 millimeters (AP by transverse by CC) for an estimated blood volume of 5-6 milliliters. This is at the site of mild focal T1 hypointensity along the stimulator lead on the postoperative MRI (series 5, image 88 of that exam) which was not thought to reflect hemorrhage at that time. Trace surrounding edema. Minimal regional mass effect. No extra-axial or intraventricular hemorrhage identified. Gray-white matter differentiation elsewhere is stable and within normal limits. No midline shift or ventriculomegaly. Basilar cisterns remain patent. Vascular: Calcified atherosclerosis at the skull base. No suspicious intracranial vascular hyperdensity. Skull: The anterior and posterior fiducials have been removed. Bilateral superior convexity burr holes placed. Skull otherwise remains intact. 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 Associated advanced degenerative changes at the craniocervical junction. Sinuses/Orbits: Paranasal sinuses and mastoids are stable and well pneumatized. Other: Postoperative changes to the bilateral superior and lateral scalp soft tissues with stimulator electrodes in place, overlying scalp skin staples. Small volume generalized scalp hematoma with scattered occasional soft tissue gas. Hematoma tracking inferiorly mostly along the left face and into the bilateral suboccipital  soft tissues. Orbits soft tissues appear stable and negative. Negative visible noncontrast deep soft tissue spaces of the face. IMPRESSION: 1. Small round 2.5 cm area of parenchymal hemorrhage along the posterior course of the left DBS at the junction of the left middle and superior frontal gyri. Estimated blood volume 5-6 milliliters. Minimal regional mass effect, and no extra-axial or intraventricular extension of blood. This corresponds to an area of mild focal T1 hypointensity on the postoperative brain MRI, which was thought to be mild postoperative edema and not hematoma. 2. Persistent small volume pneumocephalus scattered at the vertex and along the frontal convexities, more so on the right. Minimal associated mass effect. Electronically Signed: By: Odessa FlemingH  Hall M.D. On: 05/06/2017 10:50    Cardiac Studies   N/a  Patient Profile     77 y.o. male with a hx of with hx of CAD s/p CABG, HLD, HTN, GERD and essential tremor who is being seen today for the evaluation of pre-operative clearance at the request of Dr. Venetia MaxonStern.  Assessment & Plan    1. Pre op evaluation: Was originally planned for stress testing over the weekend but was unable to do given his tremors. Discussion was had with family via Dr. Johney FrameAllred that may need to proceed with surgery if indicated without further work up. Was reported to  have used SL nitro several days prior to admission, which he seldom does. No chest pain reported while admitted. Plan at this time appears to be holding off on next stage of surgery until recovered to baseline. Possible placement for CIR.   2. CAD s/p CABG: No chest pain reported. On medical therapy with statin, ARB and BB  3. HTN: stable with current therapy   Signed, Laverda Page, NP  05/07/2017, 11:41 AM  Pager # [phone]   For questions or updates, please contact [facility] Please consult [email or web address] for contact info under Cardiology/STEMI.

## 2017-05-07 NOTE — Progress Notes (Addendum)
Subjective: Patient reports "Yeah..let's go"  Objective: Vital signs in last 24 hours: Temp:  [97.7 F (36.5 C)-101.2 F (38.4 C)] 97.7 F (36.5 C) (01/21 0745) Pulse Rate:  [78-98] 78 (01/21 0314) Resp:  [18-19] 19 (01/21 0314) BP: (137-168)/(69-94) 139/74 (01/21 0314) SpO2:  [100 %] 100 % (01/21 0314)  Intake/Output from previous day: 01/20 0701 - 01/21 0700 In: 100 [P.O.:100] Out: 1100 [Urine:1100] Intake/Output this shift: No intake/output data recorded.  Awakens to voice. Attends and responds quickly to questions,  He states his name with some difficulty and acknowledges he does not know his location. With prompting he recalls admission for surgery. Follows commands, moving extremities. PEARL. Sips Diet DrPepper with straw without difficulty. Tremor persists as expected.  [Wife and daughter are quite pleased with his responsiveness this morning, noting he had refused all p.o. over weekend, verbalizing very little, and appearing very agitated. He required an I&O cath and became combative.]   Lab Results: No results for input(s): WBC, HGB, HCT, PLT in the last 72 hours. BMET Recent Labs    05/06/17 1150  NA 136  K 4.3  CL 102  CO2 20*  GLUCOSE 152*  BUN 16  CREATININE 1.17  CALCIUM 8.8*    Studies/Results: Ct Head Wo Contrast  Addendum Date: 05/06/2017   ADDENDUM REPORT: 05/06/2017 11:32 ADDENDUM: Critical Value/emergent results were called by telephone at the time of interpretation on 05/06/2017 at 1105 hours to Dr. Tressie Stalker , who verbally acknowledged these results. Electronically Signed   By: Odessa Fleming M.D.   On: 05/06/2017 11:32   Result Date: 05/06/2017 CLINICAL DATA:  77 year old male status post deep brain stimulator placement postoperative day 2. EXAM: CT HEAD WITHOUT CONTRAST TECHNIQUE: Contiguous axial images were obtained from the base of the skull through the vertex without intravenous contrast. COMPARISON:  Post DBS brain MRI 05/04/2017. Preoperative  head CT 04/26/2017, and earlier. FINDINGS: Brain: Small volume pneumocephalus scattered at the vertex bilaterally and along the anterior frontal convexities, more so on the right. Minimal associated mass effect. A rounded area of parenchymal hemorrhage has developed along the posterior course of the left DBS at the junction of the left middle and superior frontal gyri measuring about 20 x 22 x 25 millimeters (AP by transverse by CC) for an estimated blood volume of 5-6 milliliters. This is at the site of mild focal T1 hypointensity along the stimulator lead on the postoperative MRI (series 5, image 88 of that exam) which was not thought to reflect hemorrhage at that time. Trace surrounding edema. Minimal regional mass effect. No extra-axial or intraventricular hemorrhage identified. Gray-white matter differentiation elsewhere is stable and within normal limits. No midline shift or ventriculomegaly. Basilar cisterns remain patent. Vascular: Calcified atherosclerosis at the skull base. No suspicious intracranial vascular hyperdensity. Skull: The anterior and posterior fiducials have been removed. Bilateral superior convexity burr holes placed. Skull otherwise remains intact. 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 Associated advanced degenerative changes at the craniocervical junction. Sinuses/Orbits: Paranasal sinuses and mastoids are stable and well pneumatized. Other: Postoperative changes to the bilateral superior and lateral scalp soft tissues with stimulator electrodes in place, overlying scalp skin staples. Small volume generalized scalp hematoma with scattered occasional soft tissue gas. Hematoma tracking inferiorly mostly along the left face and into the bilateral suboccipital soft tissues. Orbits soft tissues appear stable and negative. Negative visible noncontrast deep soft tissue spaces of the face. IMPRESSION: 1. Small round 2.5 cm area of parenchymal hemorrhage along  the posterior course of the left DBS at the junction of the left middle and superior frontal gyri. Estimated blood volume 5-6 milliliters. Minimal regional mass effect, and no extra-axial or intraventricular extension of blood. This corresponds to an area of mild focal T1 hypointensity on the postoperative brain MRI, which was thought to be mild postoperative edema and not hematoma. 2. Persistent small volume pneumocephalus scattered at the vertex and along the frontal convexities, more so on the right. Minimal associated mass effect. Electronically Signed: By: Odessa FlemingH  Hall M.D. On: 05/06/2017 10:50    Assessment/Plan:   LOS: 3 days  Continue supportive care.    Georgiann Cockeroteat, Brian 05/07/2017, 7:58 AM  Patient is confused, but improved this morning.  He doesn't know his age or Brian's name, but otherwise is able to follow commands with both sides.  Counts fingers, states name.    I agree with progressing to CIR and holding off on next stage of surgery until he has recovered to his baseline.  He will still need cardiac clearance, but we will hold on stress test until he is able to comply with testing requirements.  I discussed situation in depth with patient's wife and daughter and answered their questions.  Electrolytes OK today.

## 2017-05-07 NOTE — Telephone Encounter (Signed)
Reviewed records.  Talked with Dr. Venetia MaxonStern.  Called patients wife but no answer and had to leave v/m.  No information left except that I called.

## 2017-05-08 ENCOUNTER — Telehealth: Payer: Self-pay | Admitting: Neurology

## 2017-05-08 MED ORDER — PRIMIDONE 50 MG PO TABS
50.0000 mg | ORAL_TABLET | Freq: Two times a day (BID) | ORAL | Status: DC
  Administered 2017-05-08 – 2017-05-11 (×6): 50 mg via ORAL
  Filled 2017-05-08 (×6): qty 1

## 2017-05-08 NOTE — Progress Notes (Signed)
Discussed cardiology decision with the patient's daughter.  Please see yesterday's note.  Wife is not present.  Please call if we may be of further assistance.

## 2017-05-08 NOTE — Progress Notes (Addendum)
Subjective: Patient reports "Hey! I know you!"  Objective: Vital signs in last 24 hours: Temp:  [97.7 F (36.5 C)-99 F (37.2 C)] 98.3 F (36.8 C) (01/22 0710) Pulse Rate:  [57] 57 (01/22 0323) BP: (118-161)/(61-73) 136/70 (01/22 0323)  Intake/Output from previous day: 01/21 0701 - 01/22 0700 In: 900 [P.O.:900] Out: 975 [Urine:975] Intake/Output this shift: No intake/output data recorded.  Alert, smiling. Responds to questions readily, but some confusion remains. Unable to state location, but calls daughter by name for fisrt time this morning. MAEW. Tremor persists as expected. Scalp incisions dry beneath DSD. Staples intact. Required in & out cath last night.  Lab Results: No results for input(s): WBC, HGB, HCT, PLT in the last 72 hours. BMET Recent Labs    05/06/17 1150  NA 136  K 4.3  CL 102  CO2 20*  GLUCOSE 152*  BUN 16  CREATININE 1.17  CALCIUM 8.8*    Studies/Results: Ct Head Wo Contrast  Addendum Date: 05/06/2017   ADDENDUM REPORT: 05/06/2017 11:32 ADDENDUM: Critical Value/emergent results were called by telephone at the time of interpretation on 05/06/2017 at 1105 hours to Dr. Tressie Stalker , who verbally acknowledged these results. Electronically Signed   By: Odessa Fleming M.D.   On: 05/06/2017 11:32   Result Date: 05/06/2017 CLINICAL DATA:  77 year old male status post deep brain stimulator placement postoperative day 2. EXAM: CT HEAD WITHOUT CONTRAST TECHNIQUE: Contiguous axial images were obtained from the base of the skull through the vertex without intravenous contrast. COMPARISON:  Post DBS brain MRI 05/04/2017. Preoperative head CT 04/26/2017, and earlier. FINDINGS: Brain: Small volume pneumocephalus scattered at the vertex bilaterally and along the anterior frontal convexities, more so on the right. Minimal associated mass effect. A rounded area of parenchymal hemorrhage has developed along the posterior course of the left DBS at the junction of the left middle  and superior frontal gyri measuring about 20 x 22 x 25 millimeters (AP by transverse by CC) for an estimated blood volume of 5-6 milliliters. This is at the site of mild focal T1 hypointensity along the stimulator lead on the postoperative MRI (series 5, image 88 of that exam) which was not thought to reflect hemorrhage at that time. Trace surrounding edema. Minimal regional mass effect. No extra-axial or intraventricular hemorrhage identified. Gray-white matter differentiation elsewhere is stable and within normal limits. No midline shift or ventriculomegaly. Basilar cisterns remain patent. Vascular: Calcified atherosclerosis at the skull base. No suspicious intracranial vascular hyperdensity. Skull: The anterior and posterior fiducials have been removed. Bilateral superior convexity burr holes placed. Skull otherwise remains intact. 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 Associated advanced degenerative changes at the craniocervical junction. Sinuses/Orbits: Paranasal sinuses and mastoids are stable and well pneumatized. Other: Postoperative changes to the bilateral superior and lateral scalp soft tissues with stimulator electrodes in place, overlying scalp skin staples. Small volume generalized scalp hematoma with scattered occasional soft tissue gas. Hematoma tracking inferiorly mostly along the left face and into the bilateral suboccipital soft tissues. Orbits soft tissues appear stable and negative. Negative visible noncontrast deep soft tissue spaces of the face. IMPRESSION: 1. Small round 2.5 cm area of parenchymal hemorrhage along the posterior course of the left DBS at the junction of the left middle and superior frontal gyri. Estimated blood volume 5-6 milliliters. Minimal regional mass effect, and no extra-axial or intraventricular extension of blood. This corresponds to an area of mild focal T1 hypointensity on the postoperative brain MRI, which was thought  to be  mild postoperative edema and not hematoma. 2. Persistent small volume pneumocephalus scattered at the vertex and along the frontal convexities, more so on the right. Minimal associated mass effect. Electronically Signed: By: Odessa FlemingH  Hall M.D. On: 05/06/2017 10:50    Assessment/Plan:   LOS: 4 days  CIR when approved by insurance   Georgiann Cockeroteat, Brian 05/08/2017, 7:44 AM  Patient is improving.  He will benefit from Rehab stay.

## 2017-05-08 NOTE — Progress Notes (Signed)
I&O catherized patient at 2110 - 400ml urine output. Patient tolerated well. Continue to monitor.

## 2017-05-08 NOTE — Telephone Encounter (Signed)
Called wife.  Reviewed records.  Wife appreciative of call.  Told her to let us know if needed anything.

## 2017-05-08 NOTE — Progress Notes (Signed)
Patient refused unjury. Patient stated that it tasted terrible. Patient did eat apple sauce this evening. Continue to monitor intake and output.

## 2017-05-08 NOTE — Progress Notes (Signed)
Inpatient Rehabilitation  I await insurance decision plan to follow up with team when I know.  Call if questions.   Brendan FerrettiMelissa Jayceon [name], M.A., CCC/SLP Admission Coordinator  [facility]  Cell [phone]

## 2017-05-08 NOTE — Progress Notes (Addendum)
Patient ID: [name] W Recine, male   DOB: 03/06/1941, 77 y.o.   MRN: [id number] In good spirits, sitting in chair. Family present. No change since this morning LK:[id number]:expressive dysphasia. Bladder scan earlier revealed ~26150ml, but pt has not voided all day while maintaining regular diet/intake. Ok per DrStern for Foley for bladder rest if next scan/cath reveals >34500ml. Dr. Venetia MaxonStern will d/w DrTat possible resumption of tremor meds to maximize CIR potential while we wait to reschedule surgery for placement of pulse generators. Awaiting insurance approval for CIR.   Continuing to work with PT.  Awaiting Rehab bed.

## 2017-05-08 NOTE — Telephone Encounter (Signed)
Talked with Dr. Venetia MaxonStern.  Felt that restarting meds may help.  Meds he was previously on for tremor may contribute to confusion so will only order small dosages.  Was previously on gabapentin 600 mg tid and primidone 250 mg bid.  Will only start primidone 50 mg bid for now

## 2017-05-09 NOTE — Clinical Social Work Note (Signed)
Clinical Social Work Assessment  Patient Details  Name: Brendan [name] MRN: [id number] Date of Birth: 22-Mar-1941  Date of referral:  05/09/17               Reason for consult:  Facility Placement                Permission sought to share information with:  Family Supports, Chartered certified accountant granted to share information::  Yes, Verbal Permission Granted  Name::      Brendan [name]  Agency::   [facility]  Relationship::   wife  Contact Information:     Housing/Transportation Living arrangements for the past 2 months:    Source of Information:  Adult Children, Spouse, Patient Patient Interpreter Needed:  None Criminal Activity/Legal Involvement Pertinent to Current Situation/Hospitalization:  No - Comment as needed Significant Relationships:  Adult Children, Spouse Lives with:  Spouse Do you feel safe going back to the place where you live?  Yes Need for family participation in patient care:  Yes (Comment)  Care giving concerns:  Pt required assistance with mobility and decision making. SNF recommended after CIR denial in order to maximize therapeutic support.    Social Worker assessment / plan:  CSW met with pt, pt wife, and pt daughter. Pt wife and daughter provided most of the assessment answers. CSW provided support as SNF was not original plan for patient, pt wife expressed worry at this new plan and stated that she would like placement for pt at [facility] if possible as it is near to the pt family home. CSW explained SNF process and will follow up with family when pt offers available. CSW continuing to follow.   Employment status:  Retired Advertising copywriter PT Recommendations:  [place], [place] / Referral to community resources:  [place]  Patient/Family's Response to care:  Pt family okay with SNF, needed more information about the process but okay with placement near home. Pt  family understands SNF recommendation and CSW role.   Patient/Family's Understanding of and Emotional Response to Diagnosis, Current Treatment, and Prognosis:  Pt family expressed worry and were a little bit teary while speaking with CSW but they state understanding of SNF recommendation and pt's diagnosis, current treatment, and prognosis.   Emotional Assessment Appearance:  Appears stated age Attitude/Demeanor/Rapport:  Unable to Assess Affect (typically observed):  Unable to Assess Orientation:  Oriented to Self Alcohol / Substance use:  Not Applicable Psych involvement (Current and /or in the community):  No (Comment)  Discharge Needs  Concerns to be addressed:  Adjustment to Illness, Care Coordination, Discharge Planning Concerns, Decision making concerns Readmission within the last 30 days:  No Current discharge risk:  Physical Impairment, Cognitively Impaired Barriers to Discharge:  Continued Medical Work up, [employer], [place] 05/09/2017, 12:42 PM

## 2017-05-09 NOTE — Progress Notes (Signed)
Occupational Therapy Treatment Patient Details Name: Brendan [name] MRN: [id number] DOB: 05/26/1940 Today's Date: 05/09/2017    History of present illness Admitted with essential tremor; now s/p Bilateral Deep brain stimulator placement. Recovery course complicated by confusion. CT 05/06/17 revealed small amount of pneumocephalus on the R, small R subdural hematoma, small intracerebral hemorrhage along the insertion site of the L electrode.  Has a pertinent past medical history of Anxiety disorder, Benign enlargement of prostate, CAD in native artery (1997), Cervical spinal stenosis, Complex regional pain syndrome of right upper extremity, Coronary atherosclerosis of artery bypass graft (June 2013), Gait disorder, , Memory loss, Peripheral neuropathy, S/P CABG x 3 (2004), Tremor, essential, and Unstable angina pectoris [facility](HCC) ( June 2013).   OT comments  Pt fatigued this pm.  Worked on EOB sitting and facilitation of weight shifts to improve trunk control and balance.  He was limited by fatigue and returned to supine   Follow Up Recommendations  SNF;Supervision/Assistance - 24 hour    Equipment Recommendations       Recommendations for Other Services      Precautions / Restrictions Precautions Precautions: Fall Precaution Comments: confused       Mobility Bed Mobility Overal bed mobility: Needs Assistance Bed Mobility: Rolling;Sidelying to Sit;Sit to Supine Rolling: Min assist   Supine to sit: Mod assist     General bed mobility comments: step by step and assist to move LEs and trunk.  Pt moves very slowly   Transfers                 General transfer comment: Pt too fatigued to attempt     Balance Overall balance assessment: Needs assistance Sitting-balance support: Feet supported Sitting balance-Leahy Scale: Fair Sitting balance - Comments: worked on active reaching to facilitate weight shift, however, pt avoids shifting off base of support.  Pt frequently closes  eyes.  Denies dizziness, endorses fatigue                                    ADL either performed or assessed with clinical judgement   ADL Overall ADL's : Needs assistance/impaired     Grooming: Wash/dry hands;Maximal assistance;Sitting                                       Vision       Perception     Praxis      Cognition Arousal/Alertness: Awake/alert Behavior During Therapy: WFL for tasks assessed/performed;Flat affect Overall Cognitive Status: Impaired/Different from baseline Area of Impairment: Attention;Following commands;Problem solving                   Current Attention Level: Focused;Sustained   Following Commands: Follows one step commands consistently;Follows one step commands with increased time     Problem Solving: Slow processing;Decreased initiation;Difficulty sequencing;Requires verbal cues;Requires tactile cues          Exercises     Shoulder Instructions       General Comments Pt's daughter present     Pertinent Vitals/ Pain       Pain Assessment: No/denies pain  Home Living  Prior Functioning/Environment              Frequency  Min 3X/week        Progress Toward Goals  OT Goals(current goals can now be found in the care plan section)  Progress towards OT goals: Progressing toward goals     Plan Discharge plan needs to be updated    Co-evaluation                 AM-PAC PT "6 Clicks" Daily Activity     Outcome Measure   Help from another person eating meals?: Total Help from another person taking care of personal grooming?: A Lot Help from another person toileting, which includes using toliet, bedpan, or urinal?: Total Help from another person bathing (including washing, rinsing, drying)?: Total Help from another person to put on and taking off regular upper body clothing?: Total Help from another person to put on  and taking off regular lower body clothing?: Total 6 Click Score: 7    End of Session    OT Visit Diagnosis: Other abnormalities of gait and mobility (R26.89);Ataxia, unspecified (R27.0);Muscle weakness (generalized) (M62.81);Other symptoms and signs involving cognitive function   Activity Tolerance Patient limited by fatigue   Patient Left in bed;with call bell/phone within reach;with bed alarm set;with family/visitor present   Nurse Communication Mobility status        Time: 9604-5409 OT Time Calculation (min): 21 min  Charges: OT General Charges $OT Visit: 1 Visit OT Treatments $Neuromuscular Re-education: 8-22 mins  [employer], OTR/L [phone]    Jeani Hawking M 05/09/2017, 5:30 PM

## 2017-05-09 NOTE — Progress Notes (Signed)
Inpatient Rehabilitation  Received a denial for IP Rehab admission with no grounds to justify a peer-to-peer review; as a result, the denial is upheld.  I have notified the patient, family, nurse case manager, and CSW.  They have a SNF that is close to home they would like to see if patient could go to for rehab.  Will sign off at this time.  Call if questions.   Charlane FerrettiMelissa Jacobie Stamey, M.A., CCC/SLP Admission Coordinator  [facility]  Cell [phone]

## 2017-05-09 NOTE — Progress Notes (Deleted)
Patient is transfered from room 4NP02 to unit 4W07 at this time. Alert and in stable condition. Report given to receiving nurse Doree FudgeLuz, RN with all questions answered. Transported via bed with family and all belongings at side.

## 2017-05-09 NOTE — Progress Notes (Signed)
Subjective: Patient reports doing better  Objective: Vital signs in last 24 hours: Temp:  [97.8 F (36.6 C)-98.7 F (37.1 C)] 98.3 F (36.8 C) (01/23 0400) Pulse Rate:  [63-69] 69 (01/23 0400) Resp:  [18] 18 (01/23 0400) BP: (118-147)/(65-86) 138/76 (01/23 0400) SpO2:  [98 %-100 %] 98 % (01/23 0400)  Intake/Output from previous day: 01/22 0701 - 01/23 0700 In: 480 [P.O.:480] Out: 465 [Urine:465] Intake/Output this shift: No intake/output data recorded.  Physical Exam: Patient less confused.  Mobilizing better with PT.  Required Foley for urinary retention.  Lab Results: No results for input(s): WBC, HGB, HCT, PLT in the last 72 hours. BMET Recent Labs    05/06/17 1150  NA 136  K 4.3  CL 102  CO2 20*  GLUCOSE 152*  BUN 16  CREATININE 1.17  CALCIUM 8.8*    Studies/Results: No results found.  Assessment/Plan: Awaiting Rehab bed.  Making good progress.    LOS: 5 days    Dorian HeckleSTERN,Ranya Fiddler D, MD 05/09/2017, 7:48 AM

## 2017-05-09 NOTE — Progress Notes (Signed)
Physical Therapy Treatment Patient Details Name: Brendan [name] W Antonacci MRN: [id number] DOB: 08/24/1940 Today's Date: 05/09/2017    History of Present Illness Admitted with essential tremor; now s/p Bilateral Deep brain stimulator placement. Recovery course complicated by confusion. CT 05/06/17 revealed small amount of pneumocephalus on the R, small R subdural hematoma, small intracerebral hemorrhage along the insertion site of the L electrode.  Has a pertinent past medical history of Anxiety disorder, Benign enlargement of prostate, CAD in native artery (1997), Cervical spinal stenosis, Complex regional pain syndrome of right upper extremity, Coronary atherosclerosis of artery bypass graft (June 2013), Gait disorder, , Memory loss, Peripheral neuropathy, S/P CABG x 3 (2004), Tremor, essential, and Unstable angina pectoris [facility](HCC) ( June 2013).    PT Comments    Pt with improved ambulation tolerance and ability to transfer however remains to have many cognitive deficits. Pt with impaired comprehension and delayed processing. Pt appears to have R sided neglect and has a very difficulty time going towards the right, ie. Turning, side stepping. Pt con't to be confused and has delayed processing. Acute PT to con't to follow. Con't to recommend CIR as pt was indep PTA.   Follow Up Recommendations  CIR     Equipment Recommendations  Rolling walker with 5" wheels;3in1 (PT)    Recommendations for Other Services Rehab consult     Precautions / Restrictions Precautions Precautions: Fall Precaution Comments: confused    Mobility  Bed Mobility               General bed mobility comments: pt up in chair upon PT arrival  Transfers Overall transfer level: Needs assistance Equipment used: 1 person hand held assist Transfers: Sit to/from Stand Sit to Stand: Min assist         General transfer comment: v/c's to scoot forward, push up off of armrests of chairs, minA to steady pt in  standing  Ambulation/Gait Ambulation/Gait assistance: Mod assist Ambulation Distance (Feet): 150 Feet Assistive device: Rolling walker (2 wheeled) Gait Pattern/deviations: Step-through pattern;Decreased stride length;Trunk flexed Gait velocity: slow Gait velocity interpretation: Below normal speed for age/gender General Gait Details: modA for walker management, and directional v/c's. pt turns to the L every time despite max v/c's to turn to the R. Pt able to walk with R HHA only however pt with decreased step length and a litte more anxious.   Stairs            Wheelchair Mobility    Modified Rankin (Stroke Patients Only)       Balance Overall balance assessment: Needs assistance Sitting-balance support: Feet supported Sitting balance-Leahy Scale: Fair       Standing balance-Leahy Scale: Poor                 High Level Balance Comments: tried to side step to the right and pt unable to comprehend task despite max verbal and tactile directional cues. pt able to go to the L but no the R.             Cognition Arousal/Alertness: Awake/alert Behavior During Therapy: WFL for tasks assessed/performed Overall Cognitive Status: Impaired/Different from baseline Area of Impairment: Orientation;Attention;Memory;Following commands;Problem solving                 Orientation Level: Disoriented to;Time;Situation Current Attention Level: Sustained Memory: Decreased short-term memory Following Commands: Follows one step commands inconsistently;Follows one step commands with increased time     Problem Solving: Slow processing;Decreased initiation;Difficulty sequencing;Requires verbal cues;Requires tactile cues(difficulty with  utilizing R side or going to the R side,)        Exercises      General Comments        Pertinent Vitals/Pain Pain Assessment: Faces Faces Pain Scale: No hurt Pain Intervention(s): Monitored during session    Home Living                       Prior Function            PT Goals (current goals can now be found in the care plan section) Progress towards PT goals: Progressing toward goals    Frequency    Min 4X/week      PT Plan Current plan remains appropriate    Co-evaluation              AM-PAC PT "6 Clicks" Daily Activity  Outcome Measure  Difficulty turning over in bed (including adjusting bedclothes, sheets and blankets)?: A Little Difficulty moving from lying on back to sitting on the side of the bed? : A Lot Difficulty sitting down on and standing up from a chair with arms (e.g., wheelchair, bedside commode, etc,.)?: A Little Help needed moving to and from a bed to chair (including a wheelchair)?: A Little Help needed walking in hospital room?: A Little Help needed climbing 3-5 steps with a railing? : A Lot 6 Click Score: 16    End of Session Equipment Utilized During Treatment: Gait belt Activity Tolerance: Patient tolerated treatment well Patient left: in chair;with chair alarm set;with call bell/phone within reach Nurse Communication: Mobility status PT Visit Diagnosis: Unsteadiness on feet (R26.81);Other abnormalities of gait and mobility (R26.89);Apraxia (R48.2);Other symptoms and signs involving the nervous system (R29.898)     Time: 1610-9604 PT Time Calculation (min) (ACUTE ONLY): 23 min  Charges:  $Gait Training: 8-22 mins $Neuromuscular Re-education: 8-22 mins                    G Codes:       Lewis Shock, PT, DPT Pager #: [phone] Office #: [phone]    Geoffrey Mankin M Ayriel Texidor 05/09/2017, 11:34 AM

## 2017-05-09 NOTE — NC FL2 (Addendum)
MEDICAID FL2 LEVEL OF CARE SCREENING TOOL     IDENTIFICATION  Patient Name: Brendan [name] Birthdate: [birth year] Sex: male Admission Date (Current Location): 05/04/2017  [facility] and [place] Number:  Producer, television/film/video and Address:  [facility], [address] [place], [place] [address]      Provider Number: [id number]  Attending Physician Name and Address:  Maeola Harman, MD  Relative Name and Phone Number:  Vester Balthazor [phone]    Current Level of Care: Hospital Recommended Level of Care: Skilled Nursing Facility Prior Approval Number:    Date Approved/Denied:   PASRR Number:   [id number]  Discharge Plan: SNF    Current Diagnoses: Patient Active Problem List   Diagnosis Date Noted  . Coronary artery disease involving native coronary artery of native heart without angina pectoris   . Glaucoma   . Anxiety state   . Cognitive disorder   . Urinary retention   . Traumatic hemorrhage of left cerebrum without loss of consciousness (HCC)   . Pressure injury of skin 05/05/2017  . Seborrheic dermatitis of scalp 10/05/2016  . Tremor, essential 10/03/2016  . Cough 06/27/2016  . Rhinitis, allergic 01/12/2014  . CHRONIC STABLE ANGINA 08/06/2013  . Obesity (BMI 30-39.9) 01/30/2013  . Lower extremity edema 01/30/2013  . S/P CABG x 3   . Atherosclerotic heart disease of native coronary artery with angina pectoris (HCC)   . Hyperlipidemia LDL goal <70   . Memory problem 07/01/2012  . Complex regional pain syndrome 05/24/2012  . Pleural plaque with presence of asbestos 05/15/2012  . Anemia, B12 deficiency 03/19/2012  . Atherosclerotic heart disease of artery bypass graft 09/16/2011  . BPH (benign prostatic hyperplasia) 08/08/2011  . Microscopic hematuria 02/27/2011  . Anxiety 10/03/2010  . Incontinence 07/21/2010  . ANGINA, STABLE 04/29/2008  . GERD 10/28/2007  . Essential tremor 08/30/2006  . HYPERTENSION, BENIGN 07/06/2006     Orientation RESPIRATION BLADDER Height & Weight     Self  Normal Indwelling catheter Weight: 211 lb (95.7 kg) Height:  5\' 6"  (167.6 cm)  BEHAVIORAL SYMPTOMS/MOOD NEUROLOGICAL BOWEL NUTRITION STATUS    (tremors; forgetful) Continent Diet(see discharge summary)  AMBULATORY STATUS COMMUNICATION OF NEEDS Skin   Limited Assist Verbally Surgical wounds, Bruising(deep tissue injury buttocks; incisions on skull)                       Personal Care Assistance Level of Assistance  Bathing, Feeding, Dressing Bathing Assistance: Limited assistance Feeding assistance: Limited assistance Dressing Assistance: Limited assistance     Functional Limitations Info  Sight, Hearing, Speech Sight Info: Adequate Hearing Info: Adequate Speech Info: Impaired    SPECIAL CARE FACTORS FREQUENCY  PT (By licensed PT), OT (By licensed OT)     PT Frequency: 4x week OT Frequency: 4x week            Contractures      Additional Factors Info  Code Status, Psychotropic, Allergies Code Status Info: Full Code Allergies Info: Lisinopril           Current Medications (05/09/2017):  This is the current hospital active medication list Current Facility-Administered Medications  Medication Dose Route Frequency Provider Last Rate Last Dose  . acetaminophen (TYLENOL) tablet 650 mg  650 mg Oral Q4H PRN Maeola Harman, MD   650 mg at 05/09/17 2536   Or  . acetaminophen (TYLENOL) suppository 650 mg  650 mg Rectal Q4H PRN Maeola Harman, MD  650 mg at 05/06/17 1545  . atorvastatin (LIPITOR) tablet 40 mg  40 mg Oral q1800 Maeola Harman, MD   40 mg at 05/08/17 1638  . bisacodyl (DULCOLAX) suppository 10 mg  10 mg Rectal Daily PRN Maeola Harman, MD      . dextrose 5 % and 0.45 % NaCl with KCl 20 mEq/L infusion   Intravenous Continuous Maeola Harman, MD   Stopped at 05/05/17 [phone]  . docusate sodium (COLACE) capsule 100 mg  100 mg Oral BID Maeola Harman, MD   100 mg at 05/09/17 4098  . furosemide (LASIX)  tablet 20 mg  20 mg Oral Daily Maeola Harman, MD   20 mg at 05/09/17 0943  . HYDROcodone-acetaminophen (NORCO/VICODIN) 5-325 MG per tablet 1 tablet  1 tablet Oral Q4H PRN Maeola Harman, MD   1 tablet at 05/08/17 2214  . HYDROcodone-acetaminophen (NORCO/VICODIN) 5-325 MG per tablet 2 tablet  2 tablet Oral Q4H PRN Maeola Harman, MD   1 tablet at 05/05/17 2039  . labetalol (NORMODYNE,TRANDATE) injection 10 mg  10 mg Intravenous Q2H PRN Tressie Stalker, MD   10 mg at 05/06/17 2307  . loratadine (CLARITIN) tablet 10 mg  10 mg Oral Daily Maeola Harman, MD   10 mg at 05/09/17 0942  . losartan (COZAAR) tablet 100 mg  100 mg Oral Daily Maeola Harman, MD   100 mg at 05/09/17 0942  . menthol-cetylpyridinium (CEPACOL) lozenge 3 mg  1 lozenge Oral PRN Maeola Harman, MD       Or  . phenol (CHLORASEPTIC) mouth spray 1 spray  1 spray Mouth/Throat PRN Maeola Harman, MD      . morphine 4 MG/ML injection 1 mg  1 mg Intravenous Q2H PRN Maeola Harman, MD      . nitroGLYCERIN (NITROSTAT) SL tablet 0.4 mg  0.4 mg Sublingual Q5 min PRN Maeola Harman, MD      . ondansetron [facility]) tablet 4 mg  4 mg Oral Q6H PRN Maeola Harman, MD       Or  . ondansetron [facility]) injection 4 mg  4 mg Intravenous Q6H PRN Maeola Harman, MD      . pantoprazole (PROTONIX) EC tablet 40 mg  40 mg Oral Daily Maeola Harman, MD   40 mg at 05/09/17 1191  . polyvinyl alcohol (LIQUIFILM TEARS) 1.4 % ophthalmic solution 1 drop  1 drop Both Eyes PRN Maeola Harman, MD      . primidone (MYSOLINE) tablet 50 mg  50 mg Oral BID Tat, Rebecca S, DO   50 mg at 05/09/17 0943  . propranolol (INDERAL) tablet 80 mg  80 mg Oral BID Maeola Harman, MD   80 mg at 05/09/17 4782  . protein supplement (UNJURY CHICKEN SOUP) powder 1 packet  1 packet Oral TID Maeola Harman, MD      . senna-docusate (Senokot-S) tablet 1 tablet  1 tablet Oral QHS PRN Maeola Harman, MD      . sodium phosphate (FLEET) 7-19 GM/118ML enema 1 enema  1 enema Rectal Once PRN Maeola Harman, MD       . tamsulosin [facility]) capsule 0.4 mg  0.4 mg Oral QPC supper Maeola Harman, MD   0.4 mg at 05/08/17 1639  . zolpidem (AMBIEN) tablet 5 mg  5 mg Oral QHS PRN Maeola Harman, MD   5 mg at 05/04/17 2256   Facility-Administered Medications Ordered in Other Encounters  Medication Dose Route Frequency Provider Last Rate Last Dose  . 0.9 %  sodium chloride infusion  Intravenous Continuous Marykay LexHarding, David W, MD      . sodium chloride 0.9 % injection 3 mL  3 mL Intravenous PRN Marykay LexHarding, David W, MD         Discharge Medications: Please see discharge summary for a list of discharge medications.  Relevant Imaging Results:  Relevant Lab Results:   Additional Information SS# [id number] ;Bilateral Deep brain stimulator placed  [employer]illard[employer]sabel H Allesandra Huebsch, LCSWA

## 2017-05-10 MED ORDER — HYDROCORTISONE 1 % EX CREA
TOPICAL_CREAM | Freq: Three times a day (TID) | CUTANEOUS | Status: DC
  Administered 2017-05-10 – 2017-05-11 (×3): via TOPICAL
  Filled 2017-05-10: qty 28

## 2017-05-10 MED ORDER — PRO-STAT SUGAR FREE PO LIQD
30.0000 mL | Freq: Two times a day (BID) | ORAL | Status: DC
  Administered 2017-05-11: 30 mL via ORAL
  Filled 2017-05-10: qty 30

## 2017-05-10 MED ORDER — ENSURE ENLIVE PO LIQD
237.0000 mL | Freq: Three times a day (TID) | ORAL | Status: DC
  Administered 2017-05-10 – 2017-05-11 (×3): 237 mL via ORAL

## 2017-05-10 NOTE — Social Work (Addendum)
CSW spoke with pt daughter this morning regarding [facility] offer of placement for pt. Pt daughter expressed gratitude and relief for this being arranged. CSW also supported pt daughter as she expressed worry for her father in the future and as she figured out the logistics of balancing being a caregiver despite [place] living in [place] and her work.   CSW awaiting signature of MD on 30 day note to obtain pt PASSR.  CSW has alerted [facility] liaison of potential d/c today or tomorrow if not able to arrange discharge today.   2:30pm- Pt has received authorization for [employer] [facility] #: [id number].   4:00pm- CSW has obtained PASSR; pt now able to discharge to SNF.  CSW continuing to follow.   Doy HutchingIsabel H Amabel Stmarie, LCSWA [facility] Clinical Social Work [phone]

## 2017-05-10 NOTE — Progress Notes (Addendum)
Subjective: Patient reports "Hey! I'm alright"  Objective: Vital signs in last 24 hours: Temp:  [97.9 F (36.6 C)-99.1 F (37.3 C)] 99.1 F (37.3 C) (01/24 0738) Pulse Rate:  [62-65] 62 (01/24 0738) Resp:  [16] 16 (01/23 1204) BP: (107-158)/(63-79) 125/65 (01/24 0738) SpO2:  [93 %-99 %] 95 % (01/24 0738)  Intake/Output from previous day: 01/23 0701 - 01/24 0700 In: 180 [P.O.:180] Out: 775 [Urine:775] Intake/Output this shift: No intake/output data recorded.  awakens to voice. Remains confused place and time. Unable to call names this morning, but smiles and follows commands. MAEW. Tremor persists as expected. Scalp incisions well-approximated, staples intact, no drainage or erythema. Foley in place for bladder rest.   Lab Results: No results for input(s): WBC, HGB, HCT, PLT in the last 72 hours. BMET No results for input(s): NA, K, CL, CO2, GLUCOSE, BUN, CREATININE, CALCIUM in the last 72 hours.  Studies/Results: No results found.  Assessment/Plan:   LOS: 6 days  Unfortunately, CIR was denied. SNF will be required for saefty and to maximize recovery. Expressive and possibly receptive dysphasia persist without significant change in the last 24 hours. Continue to mobilize, work with PT/OT.    Brendan [name], Brendan [name] 05/10/2017, 8:02 AM  Unfortunately, authorization for CIR not obtained.  Patient to go to SNF for rehabilitation.

## 2017-05-10 NOTE — Progress Notes (Signed)
Nutrition Follow-up  DOCUMENTATION CODES:   Obesity unspecified  INTERVENTION:  Discontinue Unjury.  Provide Ensure Enlive po TID, each supplement provides 350 kcal and 20 grams of protein.  Provide 30 ml Prostat po BID, each supplement provides 100 kcal and 15 grams of protein.   Encourage adequate PO intake.   NUTRITION DIAGNOSIS:   Inadequate oral intake related to poor appetite as evidenced by per patient/family report; ongoing  GOAL:   Patient will meet greater than or equal to 90% of their needs; progressing  MONITOR:   PO intake, Supplement acceptance, Labs, I & O's, Weight trends, Skin  REASON FOR ASSESSMENT:   Low Braden    ASSESSMENT:    77 y.o. male with history of CAD, glaucoma, anxiety disorder, mild cognitive disorder, essential tremors X 25 years with gait disorder who was admitted on 05/04/17 for placement of DBS. CT head reviewed, showing left hemorrhage. Per report, 2.5 cm area of parenchymal hemorrhage along posterior course of DBA at junction of left middle and superior frontal gyri.  Meal completion has been 5-50%. Pt continues with poor po intake. Pt reports no abdominal pains or other difficulties however. Pt currently has Unjury ordered however refusing most of them. RD to discontinue ordered. Noted pt was consuming Ensure at time of visit. RD to order Ensure instead as well as Prostat to aid in adequate protein intake. Family at been encouraging po intake at meals. Plans for SNF at discharge.   Labs and medications reviewed.   Diet Order:  Diet regular Room service appropriate? Yes; Fluid consistency: Thin  EDUCATION NEEDS:   Not appropriate for education at this time  Skin:  Skin Assessment: Skin Integrity Issues: Skin Integrity Issues:: DTI, Incisions DTI: buttocks Incisions: head  Last BM:  1/24  Height:   Ht Readings from Last 1 Encounters:  05/04/17 5\' 6"  (1.676 m)    Weight:   Wt Readings from Last 1 Encounters:  05/04/17  211 lb (95.7 kg)    Ideal Body Weight:  64.5 kg  BMI:  Body mass index is 34.06 kg/m.  Estimated Nutritional Needs:   Kcal:  1900-2100  Protein:  95-110 grams  Fluid:  1.9 - 2.1 L/day    Roslyn SmilingStephanie Ellie Bryand, MS, RD, LDN Pager # [phone] After hours/ weekend pager # [phone]

## 2017-05-10 NOTE — Progress Notes (Addendum)
Patient ID: Brendan [name], male   DOB: 09/19/1940, 77 y.o.   MRN: [id number] Awake, smiling. Wife & daughter present. [facility] but continues to have difficulty recalling names. Up to bathroom x2 today for BM. No new issues. Planning for [facility] - likely tomorrow.  Staples can be removed next week at SNF, or in office. Current meds will continue in SNF. Plan to d/c Foley in the am, should have sufficient bladder rest by that time.

## 2017-05-10 NOTE — Progress Notes (Signed)
Pt had little output this morning. He is saline locked and has not consumed anything since yesterday afternoon. Will pass on to day RN.

## 2017-05-10 NOTE — Progress Notes (Signed)
Physical Therapy Treatment Patient Details Name: Brendan [name] W Kernen MRN: [id number] DOB: 07/25/1940 Today's Date: 05/10/2017    History of Present Illness Admitted with essential tremor; now s/p Bilateral Deep brain stimulator placement. Recovery course complicated by confusion. CT 05/06/17 revealed small amount of pneumocephalus on the R, small R subdural hematoma, small intracerebral hemorrhage along the insertion site of the L electrode.  Has a pertinent past medical history of Anxiety disorder, Benign enlargement of prostate, CAD in native artery (1997), Cervical spinal stenosis, Complex regional pain syndrome of right upper extremity, Coronary atherosclerosis of artery bypass graft (June 2013), Gait disorder, , Memory loss, Peripheral neuropathy, S/P CABG x 3 (2004), Tremor, essential, and Unstable angina pectoris [facility](HCC) ( June 2013).    PT Comments    Patient progressing with ambulation/balance activities.  Remains confused and with high risk for falls.  Denied for CIR due to insurance.  Feel may need SNF level rehab at d/c prior to d/c home.  PT to follow acutely.  RN informed pt needs SLP consult.    Follow Up Recommendations  SNF;Supervision/Assistance - 24 hour     Equipment Recommendations  Rolling walker with 5" wheels;3in1 (PT)    Recommendations for Other Services       Precautions / Restrictions Precautions Precautions: Fall    Mobility  Bed Mobility         Supine to sit: Min guard;HOB elevated Sit to supine: Min guard   General bed mobility comments: able to sit up with assist for balance, safety, to supine with assist for positioning, mod [facility]  Transfers Overall transfer level: Needs assistance Equipment used: 1 person hand held assist Transfers: Sit to/from Stand Sit to Stand: Min assist         General transfer comment: cues for technique  Ambulation/Gait Ambulation/Gait assistance: Min assist Ambulation Distance (Feet): 150 Feet Assistive  device: 1 person hand held assist Gait Pattern/deviations: Step-to pattern;Shuffle;Decreased stride length;Wide base of support     General Gait Details: very short strides with wide BOS, cues for turns HHA for balance, direction and support   Stairs            Wheelchair Mobility    Modified Rankin (Stroke Patients Only)       Balance Overall balance assessment: Needs assistance   Sitting balance-Leahy Scale: Fair       Standing balance-Leahy Scale: Poor Standing balance comment: standing side stepping, turning to look over shoulder, forward reach and backwards stepping with HHA                            Cognition Arousal/Alertness: Awake/alert Behavior During Therapy: WFL for tasks assessed/performed;Flat affect Overall Cognitive Status: Impaired/Different from baseline Area of Impairment: Attention;Following commands;Problem solving;Orientation                 Orientation Level: Disoriented to;Situation;Time Current Attention Level: Sustained Memory: Decreased short-term memory Following Commands: Follows one step commands consistently;Follows one step commands with increased time     Problem Solving: Slow processing;Decreased initiation;Difficulty sequencing;Requires verbal cues;Requires tactile cues        Exercises      General Comments General comments (skin integrity, edema, etc.): daughter and wife in the room, spoke with RN regarding getting SLP consult      Pertinent Vitals/Pain Pain Assessment: No/denies pain    Home Living  Prior Function            PT Goals (current goals can now be found in the care plan section) Progress towards PT goals: Progressing toward goals    Frequency    Min 3X/week      PT Plan Discharge plan needs to be updated;Frequency needs to be updated    Co-evaluation              AM-PAC PT "6 Clicks" Daily Activity  Outcome Measure  Difficulty  turning over in bed (including adjusting bedclothes, sheets and blankets)?: A Little Difficulty moving from lying on back to sitting on the side of the bed? : Unable Difficulty sitting down on and standing up from a chair with arms (e.g., wheelchair, bedside commode, etc,.)?: Unable Help needed moving to and from a bed to chair (including a wheelchair)?: A Little Help needed walking in hospital room?: A Little Help needed climbing 3-5 steps with a railing? : A Lot 6 Click Score: 13    End of Session Equipment Utilized During Treatment: Gait belt Activity Tolerance: Patient tolerated treatment well Patient left: in bed;with call bell/phone within reach;with family/visitor present   PT Visit Diagnosis: Unsteadiness on feet (R26.81);Other abnormalities of gait and mobility (R26.89);Apraxia (R48.2);Other symptoms and signs involving the nervous system (R29.898)     Time: 1540-1600 PT Time Calculation (min) (ACUTE ONLY): 20 min  Charges:  $Gait Training: 8-22 mins                    G CodesSheran Lawless, [place] [phone] 05/10/2017    Elray Mcgregor 05/10/2017, 6:08 PM

## 2017-05-10 NOTE — Progress Notes (Addendum)
[facility]ed patient to [facility], he needed to have a BM. Noticed tremor on the left side. He was alert and talking, answering questions. He is complaining of his back itching, upon assessing he has a small scattered rash. We will ask MD if he can have some cream for the itching.  Dr. Venetia MaxonStern ordered hydrocortisone for back itching.

## 2017-05-11 ENCOUNTER — Ambulatory Visit: Admit: 2017-05-11 | Payer: PPO | Admitting: Neurosurgery

## 2017-05-11 DIAGNOSIS — R5381 Other malaise: Secondary | ICD-10-CM | POA: Diagnosis not present

## 2017-05-11 DIAGNOSIS — E785 Hyperlipidemia, unspecified: Secondary | ICD-10-CM | POA: Diagnosis not present

## 2017-05-11 DIAGNOSIS — G8191 Hemiplegia, unspecified affecting right dominant side: Secondary | ICD-10-CM | POA: Diagnosis not present

## 2017-05-11 DIAGNOSIS — Z87891 Personal history of nicotine dependence: Secondary | ICD-10-CM | POA: Diagnosis not present

## 2017-05-11 DIAGNOSIS — R251 Tremor, unspecified: Secondary | ICD-10-CM | POA: Diagnosis not present

## 2017-05-11 DIAGNOSIS — G9050 Complex regional pain syndrome I, unspecified: Secondary | ICD-10-CM | POA: Diagnosis not present

## 2017-05-11 DIAGNOSIS — R4701 Aphasia: Secondary | ICD-10-CM | POA: Diagnosis not present

## 2017-05-11 DIAGNOSIS — Z8249 Family history of ischemic heart disease and other diseases of the circulatory system: Secondary | ICD-10-CM | POA: Diagnosis not present

## 2017-05-11 DIAGNOSIS — R3915 Urgency of urination: Secondary | ICD-10-CM | POA: Diagnosis not present

## 2017-05-11 DIAGNOSIS — Z79899 Other long term (current) drug therapy: Secondary | ICD-10-CM | POA: Diagnosis not present

## 2017-05-11 DIAGNOSIS — R531 Weakness: Secondary | ICD-10-CM | POA: Diagnosis not present

## 2017-05-11 DIAGNOSIS — I361 Nonrheumatic tricuspid (valve) insufficiency: Secondary | ICD-10-CM | POA: Diagnosis not present

## 2017-05-11 DIAGNOSIS — R404 Transient alteration of awareness: Secondary | ICD-10-CM | POA: Diagnosis not present

## 2017-05-11 DIAGNOSIS — Z6828 Body mass index (BMI) 28.0-28.9, adult: Secondary | ICD-10-CM | POA: Diagnosis not present

## 2017-05-11 DIAGNOSIS — G250 Essential tremor: Secondary | ICD-10-CM | POA: Diagnosis not present

## 2017-05-11 DIAGNOSIS — M6281 Muscle weakness (generalized): Secondary | ICD-10-CM | POA: Diagnosis not present

## 2017-05-11 DIAGNOSIS — Z23 Encounter for immunization: Secondary | ICD-10-CM | POA: Diagnosis not present

## 2017-05-11 DIAGNOSIS — L98411 Non-pressure chronic ulcer of buttock limited to breakdown of skin: Secondary | ICD-10-CM | POA: Diagnosis not present

## 2017-05-11 DIAGNOSIS — Y838 Other surgical procedures as the cause of abnormal reaction of the patient, or of later complication, without mention of misadventure at the time of the procedure: Secondary | ICD-10-CM | POA: Diagnosis present

## 2017-05-11 DIAGNOSIS — R2681 Unsteadiness on feet: Secondary | ICD-10-CM | POA: Diagnosis not present

## 2017-05-11 DIAGNOSIS — Z9689 Presence of other specified functional implants: Secondary | ICD-10-CM | POA: Diagnosis not present

## 2017-05-11 DIAGNOSIS — R5383 Other fatigue: Secondary | ICD-10-CM | POA: Diagnosis not present

## 2017-05-11 DIAGNOSIS — R1310 Dysphagia, unspecified: Secondary | ICD-10-CM | POA: Diagnosis not present

## 2017-05-11 DIAGNOSIS — R1311 Dysphagia, oral phase: Secondary | ICD-10-CM | POA: Diagnosis not present

## 2017-05-11 DIAGNOSIS — Z888 Allergy status to other drugs, medicaments and biological substances status: Secondary | ICD-10-CM | POA: Diagnosis not present

## 2017-05-11 DIAGNOSIS — R413 Other amnesia: Secondary | ICD-10-CM | POA: Diagnosis not present

## 2017-05-11 DIAGNOSIS — Z8679 Personal history of other diseases of the circulatory system: Secondary | ICD-10-CM | POA: Diagnosis not present

## 2017-05-11 DIAGNOSIS — F419 Anxiety disorder, unspecified: Secondary | ICD-10-CM | POA: Diagnosis not present

## 2017-05-11 DIAGNOSIS — R4182 Altered mental status, unspecified: Secondary | ICD-10-CM | POA: Diagnosis not present

## 2017-05-11 DIAGNOSIS — I63233 Cerebral infarction due to unspecified occlusion or stenosis of bilateral carotid arteries: Secondary | ICD-10-CM | POA: Diagnosis not present

## 2017-05-11 DIAGNOSIS — I2510 Atherosclerotic heart disease of native coronary artery without angina pectoris: Secondary | ICD-10-CM | POA: Diagnosis not present

## 2017-05-11 DIAGNOSIS — G9761 Postprocedural hematoma of a nervous system organ or structure following a nervous system procedure: Secondary | ICD-10-CM | POA: Diagnosis not present

## 2017-05-11 DIAGNOSIS — H409 Unspecified glaucoma: Secondary | ICD-10-CM | POA: Diagnosis not present

## 2017-05-11 DIAGNOSIS — R739 Hyperglycemia, unspecified: Secondary | ICD-10-CM | POA: Diagnosis not present

## 2017-05-11 DIAGNOSIS — I611 Nontraumatic intracerebral hemorrhage in hemisphere, cortical: Secondary | ICD-10-CM | POA: Diagnosis not present

## 2017-05-11 DIAGNOSIS — I69151 Hemiplegia and hemiparesis following nontraumatic intracerebral hemorrhage affecting right dominant side: Secondary | ICD-10-CM | POA: Diagnosis not present

## 2017-05-11 DIAGNOSIS — Z955 Presence of coronary angioplasty implant and graft: Secondary | ICD-10-CM | POA: Diagnosis not present

## 2017-05-11 DIAGNOSIS — I10 Essential (primary) hypertension: Secondary | ICD-10-CM | POA: Diagnosis not present

## 2017-05-11 DIAGNOSIS — D649 Anemia, unspecified: Secondary | ICD-10-CM | POA: Diagnosis not present

## 2017-05-11 DIAGNOSIS — R41841 Cognitive communication deficit: Secondary | ICD-10-CM | POA: Diagnosis not present

## 2017-05-11 DIAGNOSIS — K219 Gastro-esophageal reflux disease without esophagitis: Secondary | ICD-10-CM | POA: Diagnosis not present

## 2017-05-11 DIAGNOSIS — M6389 Disorders of muscle in diseases classified elsewhere, multiple sites: Secondary | ICD-10-CM | POA: Diagnosis not present

## 2017-05-11 DIAGNOSIS — E669 Obesity, unspecified: Secondary | ICD-10-CM | POA: Diagnosis not present

## 2017-05-11 DIAGNOSIS — N179 Acute kidney failure, unspecified: Secondary | ICD-10-CM | POA: Diagnosis not present

## 2017-05-11 DIAGNOSIS — G936 Cerebral edema: Secondary | ICD-10-CM | POA: Diagnosis not present

## 2017-05-11 DIAGNOSIS — G4719 Other hypersomnia: Secondary | ICD-10-CM | POA: Diagnosis not present

## 2017-05-11 SURGERY — BILATERAL PULSE GENERATOR IMPLANT
Anesthesia: General | Laterality: Bilateral

## 2017-05-11 NOTE — Clinical Social Work Placement (Signed)
   CLINICAL SOCIAL WORK PLACEMENT  NOTE  Date:  05/11/2017  Patient Details  Name: Brendan [name] Blanchet MRN: [id number] Date of Birth: 06/08/1940  Clinical Social Work is seeking post-discharge placement for this patient at the Skilled  Nursing Facility level of care (*CSW will initial, date and re-position this form in  chart as items are completed):  Yes   Patient/family provided with [place] Clinical Social Work Department's list of facilities offering this level of care within the geographic area requested by the patient (or if unable, by the patient's family).  Yes   Patient/family informed of their freedom to choose among providers that offer the needed level of care, that participate in Medicare, Medicaid or managed care program needed by the patient, have an available bed and are willing to accept the patient.  Yes   Patient/family informed of [place]'s ownership interest in [facility] and [facility], as well as of the fact that they are under no obligation to receive care at these facilities.  PASRR submitted to EDS on       PASRR number received on 05/08/17     Existing PASRR number confirmed on       FL2 transmitted to all facilities in geographic area requested by pt/family on 05/08/17     FL2 transmitted to all facilities within larger geographic area on       Patient informed that his/her managed care company has contracts with or will negotiate with certain facilities, including the following:        Yes   Patient/family informed of bed offers received.  Patient chooses bed at [facility] [place]     Physician recommends and patient chooses bed at      Patient to be transferred to [facility] [place] on 05/11/17.  Patient to be transferred to facility by [facility]     Patient family notified on 05/11/17 of transfer.  Name of family member notified:      Wife and daughter  PHYSICIAN       Additional Comment:     _______________________________________________ Doy HutchingIsabel H Jujuan Dugo, LCSWA 05/11/2017, 1:40 PM

## 2017-05-11 NOTE — Care Management Important Message (Signed)
Important Message  Patient Details  Name: Brendan BoJerry W Prezioso MRN: [id number] Date of Birth: 04/06/1941   Medicare Important Message Given:  Yes    Lawerance Sabalebbie Razi Hickle, RN 05/11/2017, 2:41 PM

## 2017-05-11 NOTE — Progress Notes (Addendum)
Subjective: Patient reports "I'm ok"  Objective: Vital signs in last 24 hours: Temp:  [97.7 F (36.5 C)-99.2 F (37.3 C)] 99.2 F (37.3 C) (01/25 0426) Pulse Rate:  [57-69] 63 (01/25 0426) BP: (113-153)/(61-83) 153/65 (01/25 0426) SpO2:  [93 %-98 %] 94 % (01/25 0426)  Intake/Output from previous day: 01/24 0701 - 01/25 0700 In: 245 [P.O.:245] Out: 575 [Urine:575] Intake/Output this shift: No intake/output data recorded.  Alert, responds to questions , but unable to recall names or what brought him to the hospital. Follows commands, moving all extremities. Foley out this am. Scalp incisions without erythema, swelling, or drainage. Staples intact. PEARL. Grips equal. Tremor persists as expected.   Lab Results: No results for input(s): WBC, HGB, HCT, PLT in the last 72 hours. BMET No results for input(s): NA, K, CL, CO2, GLUCOSE, BUN, CREATININE, CALCIUM in the last 72 hours.  Studies/Results: No results found.  Assessment/Plan:   LOS: 7 days  SNF for PT/OT/ST rehab. [facility]. Office f/u in 2-3 weeks.    Georgiann Cockeroteat, Brian 05/11/2017, 7:13 AM   Patient is making good progress. To Rehab today.

## 2017-05-11 NOTE — Discharge Summary (Signed)
Physician Discharge Summary  Patient ID: Brendan [name] MRN: [id number] DOB/AGE: April 17, 1941 77 y.o.  Admit date: 05/04/2017 Discharge date: 05/11/2017  Admission Diagnoses:Essential Tremor    Discharge Diagnoses: Essential Tremor s/p Bilateral Deep brain stimulator placement (Bilateral) - Bilateral deep brain stimulator placement     Active Problems:   Essential tremor   Pressure injury of skin   Coronary artery disease involving native coronary artery of native heart without angina pectoris   Glaucoma   Anxiety state   Cognitive disorder   Urinary retention   Traumatic hemorrhage of left cerebrum without loss of consciousness [facility])   Discharged Condition: fair  Hospital Course: Brendan [name] was admitted for bilateral deep brain stimulator electrode placement. Pt tolerated surgery well, with brief episode of confusion, attributed to likely pneumocephalus. Increased confusion following on post-op days 1 & 2 prompted head CT which revealed pneumocephalus and small right intracerebral hemorrhage. Therapies initiated, with plan for [facility]. Unfortunately, CIR denied by insurance. SNF plan initiated. Pt's confusion has improved, but expressive and possibly receptive dysphasias persist. Mobility requires max assist. Planned second stage is delayed pending pt's return to baseline cognitive status. [Cardiology was unable to perform planned testing in preparation for next stage surgery. The need  For testing/clearance will be revisited before next stage surgery.  Consults: cardiology  Significant Diagnostic Studies:   Treatments: surgery: Bilateral Deep brain stimulator placement (Bilateral) - Bilateral deep brain stimulator placement    Discharge Exam: Blood pressure 138/71, pulse 62, temperature 98.2 F (36.8 C), temperature source Oral, resp. rate 16, height 5\' 6"  (1.676 m), weight 95.7 kg (211 lb), SpO2 96 %. Alert, responds to questions , but unable  to recall names or what brought him to the hospital. Follows commands, moving all extremities. Foley out this am. Scalp incisions without erythema, swelling, or drainage. Staples intact. PEARL. Grips equal. Tremor persists as expected.    Disposition: Discharge to SNF for PT/OT/ST rehab. Pt and family voice understanding of plan and agree.  Staples may be removed next week at [facility] or office. Office f/u in 2-3 weeks.        Allergies as of 05/11/2017      Reactions   Lisinopril Cough              Medication List     TAKE these medications   atorvastatin 40 MG tablet Commonly known as:  LIPITOR Take 1 tablet (40 mg total) by mouth daily.   fish oil-omega-3 fatty acids 1000 MG capsule Take 1 g by mouth 2 (two) times daily.   furosemide 20 MG tablet Commonly known as:  LASIX Take 1 tablet (20 mg total) by mouth daily. May take an  Extra dose if needed What changed:    when to take this  additional instructions   gabapentin 300 MG capsule Commonly known as:  NEURONTIN Take 2 capsules (600 mg total) by mouth 3 (three) times daily.   HYDROcodone-acetaminophen 5-325 MG tablet Commonly known as:  NORCO/VICODIN Take 1 tablet by mouth every 6 (six) hours as needed for moderate pain.   ketoconazole 2 % shampoo Commonly known as:  NIZORAL Apply 1 application topically 2 (two) times a week. What changed:  when to take this   LONGS ADULT LOW STRENGTH ASA 81 MG EC tablet Generic drug:  aspirin Take 81 mg by mouth daily.   loratadine 10 MG tablet Commonly known as:  CLARITIN Take 1 tablet (10 mg total) by mouth daily.   losartan 100  MG tablet Commonly known as:  COZAAR Take 1 tablet (100 mg total) by mouth daily.   neomycin-bacitracin-polymyxin ointment Commonly known as:  NEOSPORIN Apply 1 application topically as needed for wound care.   nitroGLYCERIN 0.4 MG SL tablet Commonly known as:  NITROSTAT Place 1 tablet (0.4 mg total) under the tongue  every 5 (five) minutes as needed for chest pain. call 911 if chest pain not better   omeprazole 20 MG capsule Commonly known as:  PRILOSEC Take 1 capsule (20 mg total) by mouth daily as needed (acid reflux).   primidone 50 MG tablet Commonly known as:  MYSOLINE 04/19/17: 250 mg am, 200 mg hs; 04/20/17: 200 mg am, 200 mg hs, 04/21/17: 200 mg am, 150 mg hs; 1/6-1/7: 150 mg BID; 1/8-1/9: 150 mg am, 100 mg hs; 1/10-1/11: 100 mg BID; 1/12-1/13: 100 mg am, 50 mg hs; 1/14: 50 mg BID, 1/15: 50 mg QD, 1/16: Stop   propranolol 80 MG tablet Commonly known as:  INDERAL Take 1 tablet (80 mg total) by mouth 2 (two) times daily.   SYSTANE BALANCE 0.6 % Soln Generic drug:  Propylene Glycol Place 1 drop into both eyes daily as needed (for dry eyes).         Contact information for after-discharge care        Destination        [facility] .   Service:  Skilled Nursing Contact information: [address] [place] [id number] [phone]               Signed: Georgiann Cockeroteat, Brian 05/11/2017, 8:18 AM

## 2017-05-11 NOTE — Social Work (Signed)
Clinical Social Worker facilitated patient discharge including contacting patient family and facility to confirm patient discharge plans.  Clinical information faxed to facility and family agreeable with plan.  CSW arranged ambulance transport via [facility] to [facility] [place] Room 209 RN to call [phone] with report  prior to discharge.  Clinical Social Worker will sign off for now as social work intervention is no longer needed. Please consult us again if new need arises.  Doy HutchingIsabel H Mukund Weinreb, LCSWA Clinical Social Worker

## 2017-05-12 DIAGNOSIS — Z8679 Personal history of other diseases of the circulatory system: Secondary | ICD-10-CM | POA: Diagnosis not present

## 2017-05-12 DIAGNOSIS — I2510 Atherosclerotic heart disease of native coronary artery without angina pectoris: Secondary | ICD-10-CM | POA: Diagnosis not present

## 2017-05-12 DIAGNOSIS — L98411 Non-pressure chronic ulcer of buttock limited to breakdown of skin: Secondary | ICD-10-CM | POA: Diagnosis not present

## 2017-05-12 DIAGNOSIS — G4719 Other hypersomnia: Secondary | ICD-10-CM | POA: Diagnosis not present

## 2017-05-12 DIAGNOSIS — Z9689 Presence of other specified functional implants: Secondary | ICD-10-CM | POA: Diagnosis not present

## 2017-05-14 ENCOUNTER — Encounter (HOSPITAL_COMMUNITY): Payer: Self-pay | Admitting: Neurosurgery

## 2017-05-15 ENCOUNTER — Inpatient Hospital Stay (HOSPITAL_COMMUNITY)
Admission: EM | Admit: 2017-05-15 | Discharge: 2017-05-18 | DRG: 919 | Disposition: A | Discharge status: Rehabilitation Facility | Payer: PPO | Attending: Family Medicine | Admitting: Family Medicine

## 2017-05-15 ENCOUNTER — Telehealth: Payer: Self-pay | Admitting: Neurology

## 2017-05-15 ENCOUNTER — Emergency Department (HOSPITAL_COMMUNITY): Payer: PPO

## 2017-05-15 ENCOUNTER — Encounter (HOSPITAL_COMMUNITY): Payer: Self-pay | Admitting: Radiology

## 2017-05-15 ENCOUNTER — Other Ambulatory Visit: Payer: Self-pay

## 2017-05-15 DIAGNOSIS — E861 Hypovolemia: Secondary | ICD-10-CM | POA: Diagnosis not present

## 2017-05-15 DIAGNOSIS — Z6828 Body mass index (BMI) 28.0-28.9, adult: Secondary | ICD-10-CM | POA: Diagnosis not present

## 2017-05-15 DIAGNOSIS — E785 Hyperlipidemia, unspecified: Secondary | ICD-10-CM | POA: Diagnosis not present

## 2017-05-15 DIAGNOSIS — G8111 Spastic hemiplegia affecting right dominant side: Secondary | ICD-10-CM | POA: Diagnosis not present

## 2017-05-15 DIAGNOSIS — R451 Restlessness and agitation: Secondary | ICD-10-CM | POA: Diagnosis not present

## 2017-05-15 DIAGNOSIS — Y838 Other surgical procedures as the cause of abnormal reaction of the patient, or of later complication, without mention of misadventure at the time of the procedure: Secondary | ICD-10-CM | POA: Diagnosis present

## 2017-05-15 DIAGNOSIS — I25810 Atherosclerosis of coronary artery bypass graft(s) without angina pectoris: Secondary | ICD-10-CM | POA: Diagnosis not present

## 2017-05-15 DIAGNOSIS — A499 Bacterial infection, unspecified: Secondary | ICD-10-CM | POA: Diagnosis not present

## 2017-05-15 DIAGNOSIS — R4701 Aphasia: Secondary | ICD-10-CM | POA: Diagnosis not present

## 2017-05-15 DIAGNOSIS — Z888 Allergy status to other drugs, medicaments and biological substances status: Secondary | ICD-10-CM | POA: Diagnosis not present

## 2017-05-15 DIAGNOSIS — R413 Other amnesia: Secondary | ICD-10-CM | POA: Diagnosis not present

## 2017-05-15 DIAGNOSIS — D649 Anemia, unspecified: Secondary | ICD-10-CM | POA: Diagnosis not present

## 2017-05-15 DIAGNOSIS — Z79899 Other long term (current) drug therapy: Secondary | ICD-10-CM | POA: Diagnosis not present

## 2017-05-15 DIAGNOSIS — N179 Acute kidney failure, unspecified: Secondary | ICD-10-CM | POA: Diagnosis not present

## 2017-05-15 DIAGNOSIS — I611 Nontraumatic intracerebral hemorrhage in hemisphere, cortical: Secondary | ICD-10-CM | POA: Diagnosis present

## 2017-05-15 DIAGNOSIS — I2510 Atherosclerotic heart disease of native coronary artery without angina pectoris: Secondary | ICD-10-CM | POA: Diagnosis present

## 2017-05-15 DIAGNOSIS — R7309 Other abnormal glucose: Secondary | ICD-10-CM | POA: Diagnosis not present

## 2017-05-15 DIAGNOSIS — E669 Obesity, unspecified: Secondary | ICD-10-CM | POA: Diagnosis not present

## 2017-05-15 DIAGNOSIS — I619 Nontraumatic intracerebral hemorrhage, unspecified: Secondary | ICD-10-CM

## 2017-05-15 DIAGNOSIS — G9050 Complex regional pain syndrome I, unspecified: Secondary | ICD-10-CM | POA: Diagnosis not present

## 2017-05-15 DIAGNOSIS — F419 Anxiety disorder, unspecified: Secondary | ICD-10-CM | POA: Diagnosis not present

## 2017-05-15 DIAGNOSIS — R29810 Facial weakness: Secondary | ICD-10-CM | POA: Diagnosis not present

## 2017-05-15 DIAGNOSIS — B952 Enterococcus as the cause of diseases classified elsewhere: Secondary | ICD-10-CM | POA: Diagnosis not present

## 2017-05-15 DIAGNOSIS — G250 Essential tremor: Secondary | ICD-10-CM | POA: Diagnosis present

## 2017-05-15 DIAGNOSIS — R1310 Dysphagia, unspecified: Secondary | ICD-10-CM | POA: Diagnosis not present

## 2017-05-15 DIAGNOSIS — Z951 Presence of aortocoronary bypass graft: Secondary | ICD-10-CM | POA: Diagnosis not present

## 2017-05-15 DIAGNOSIS — G8110 Spastic hemiplegia affecting unspecified side: Secondary | ICD-10-CM | POA: Diagnosis not present

## 2017-05-15 DIAGNOSIS — R3915 Urgency of urination: Secondary | ICD-10-CM | POA: Diagnosis not present

## 2017-05-15 DIAGNOSIS — Z8249 Family history of ischemic heart disease and other diseases of the circulatory system: Secondary | ICD-10-CM

## 2017-05-15 DIAGNOSIS — Z9689 Presence of other specified functional implants: Secondary | ICD-10-CM | POA: Diagnosis not present

## 2017-05-15 DIAGNOSIS — R531 Weakness: Secondary | ICD-10-CM | POA: Diagnosis not present

## 2017-05-15 DIAGNOSIS — R05 Cough: Secondary | ICD-10-CM | POA: Diagnosis not present

## 2017-05-15 DIAGNOSIS — I69151 Hemiplegia and hemiparesis following nontraumatic intracerebral hemorrhage affecting right dominant side: Secondary | ICD-10-CM | POA: Diagnosis not present

## 2017-05-15 DIAGNOSIS — H409 Unspecified glaucoma: Secondary | ICD-10-CM | POA: Diagnosis not present

## 2017-05-15 DIAGNOSIS — Z87891 Personal history of nicotine dependence: Secondary | ICD-10-CM

## 2017-05-15 DIAGNOSIS — Z955 Presence of coronary angioplasty implant and graft: Secondary | ICD-10-CM | POA: Diagnosis not present

## 2017-05-15 DIAGNOSIS — K219 Gastro-esophageal reflux disease without esophagitis: Secondary | ICD-10-CM | POA: Diagnosis present

## 2017-05-15 DIAGNOSIS — D696 Thrombocytopenia, unspecified: Secondary | ICD-10-CM | POA: Diagnosis not present

## 2017-05-15 DIAGNOSIS — I952 Hypotension due to drugs: Secondary | ICD-10-CM | POA: Diagnosis not present

## 2017-05-15 DIAGNOSIS — Z9109 Other allergy status, other than to drugs and biological substances: Secondary | ICD-10-CM | POA: Diagnosis not present

## 2017-05-15 DIAGNOSIS — G936 Cerebral edema: Secondary | ICD-10-CM

## 2017-05-15 DIAGNOSIS — R410 Disorientation, unspecified: Secondary | ICD-10-CM | POA: Diagnosis not present

## 2017-05-15 DIAGNOSIS — I361 Nonrheumatic tricuspid (valve) insufficiency: Secondary | ICD-10-CM | POA: Diagnosis not present

## 2017-05-15 DIAGNOSIS — G8191 Hemiplegia, unspecified affecting right dominant side: Secondary | ICD-10-CM

## 2017-05-15 DIAGNOSIS — I10 Essential (primary) hypertension: Secondary | ICD-10-CM | POA: Diagnosis not present

## 2017-05-15 DIAGNOSIS — D62 Acute posthemorrhagic anemia: Secondary | ICD-10-CM | POA: Diagnosis not present

## 2017-05-15 DIAGNOSIS — I69193 Ataxia following nontraumatic intracerebral hemorrhage: Secondary | ICD-10-CM | POA: Diagnosis not present

## 2017-05-15 DIAGNOSIS — N390 Urinary tract infection, site not specified: Secondary | ICD-10-CM | POA: Diagnosis not present

## 2017-05-15 DIAGNOSIS — I9589 Other hypotension: Secondary | ICD-10-CM | POA: Diagnosis not present

## 2017-05-15 DIAGNOSIS — G9761 Postprocedural hematoma of a nervous system organ or structure following a nervous system procedure: Secondary | ICD-10-CM | POA: Diagnosis not present

## 2017-05-15 DIAGNOSIS — R0989 Other specified symptoms and signs involving the circulatory and respiratory systems: Secondary | ICD-10-CM | POA: Diagnosis not present

## 2017-05-15 DIAGNOSIS — I69851 Hemiplegia and hemiparesis following other cerebrovascular disease affecting right dominant side: Secondary | ICD-10-CM | POA: Diagnosis not present

## 2017-05-15 DIAGNOSIS — G479 Sleep disorder, unspecified: Secondary | ICD-10-CM | POA: Diagnosis not present

## 2017-05-15 DIAGNOSIS — N319 Neuromuscular dysfunction of bladder, unspecified: Secondary | ICD-10-CM | POA: Diagnosis not present

## 2017-05-15 DIAGNOSIS — R404 Transient alteration of awareness: Secondary | ICD-10-CM | POA: Diagnosis not present

## 2017-05-15 DIAGNOSIS — R739 Hyperglycemia, unspecified: Secondary | ICD-10-CM | POA: Diagnosis not present

## 2017-05-15 DIAGNOSIS — I69321 Dysphasia following cerebral infarction: Secondary | ICD-10-CM | POA: Diagnosis not present

## 2017-05-15 DIAGNOSIS — R251 Tremor, unspecified: Secondary | ICD-10-CM | POA: Diagnosis not present

## 2017-05-15 DIAGNOSIS — R42 Dizziness and giddiness: Secondary | ICD-10-CM | POA: Diagnosis not present

## 2017-05-15 DIAGNOSIS — I63233 Cerebral infarction due to unspecified occlusion or stenosis of bilateral carotid arteries: Secondary | ICD-10-CM | POA: Diagnosis not present

## 2017-05-15 DIAGNOSIS — R7303 Prediabetes: Secondary | ICD-10-CM | POA: Diagnosis not present

## 2017-05-15 LAB — URINALYSIS, ROUTINE W REFLEX MICROSCOPIC
Bacteria, UA: NONE SEEN
Bilirubin Urine: NEGATIVE
Glucose, UA: 50 mg/dL — AB
Ketones, ur: NEGATIVE mg/dL
Leukocytes, UA: NEGATIVE
Nitrite: NEGATIVE
Protein, ur: NEGATIVE mg/dL
Specific Gravity, Urine: 1.013 (ref 1.005–1.030)
pH: 5 (ref 5.0–8.0)

## 2017-05-15 LAB — COMPREHENSIVE METABOLIC PANEL
ALT: 43 U/L (ref 17–63)
AST: 42 U/L — ABNORMAL HIGH (ref 15–41)
Albumin: 3.2 g/dL — ABNORMAL LOW (ref 3.5–5.0)
Alkaline Phosphatase: 80 U/L (ref 38–126)
Anion gap: 13 (ref 5–15)
BUN: 20 mg/dL (ref 6–20)
CO2: 24 mmol/L (ref 22–32)
Calcium: 8.7 mg/dL — ABNORMAL LOW (ref 8.9–10.3)
Chloride: 97 mmol/L — ABNORMAL LOW (ref 101–111)
Creatinine, Ser: 1.33 mg/dL — ABNORMAL HIGH (ref 0.61–1.24)
GFR calc Af Amer: 59 mL/min — ABNORMAL LOW (ref >60)
GFR calc non Af Amer: 51 mL/min — ABNORMAL LOW (ref >60)
Glucose, Bld: 214 mg/dL — ABNORMAL HIGH (ref 65–99)
Potassium: 3.6 mmol/L (ref 3.5–5.1)
Sodium: 134 mmol/L — ABNORMAL LOW (ref 135–145)
Total Bilirubin: 0.5 mg/dL (ref 0.3–1.2)
Total Protein: 6.3 g/dL — ABNORMAL LOW (ref 6.5–8.1)

## 2017-05-15 LAB — RAPID URINE DRUG SCREEN, HOSP PERFORMED
Amphetamines: NOT DETECTED
Barbiturates: POSITIVE — AB
Benzodiazepines: NOT DETECTED
Cocaine: NOT DETECTED
Opiates: NOT DETECTED
Tetrahydrocannabinol: NOT DETECTED

## 2017-05-15 LAB — ETHANOL: Alcohol, Ethyl (B): <10 mg/dL (ref <10)

## 2017-05-15 LAB — PROTIME-INR
INR: 1.05
Prothrombin Time: 13.6 seconds (ref 11.4–15.2)

## 2017-05-15 LAB — DIFFERENTIAL
Basophils Absolute: 0 10*3/uL (ref 0.0–0.1)
Basophils Relative: 0 %
Eosinophils Absolute: 0.2 10*3/uL (ref 0.0–0.7)
Eosinophils Relative: 2 %
Lymphocytes Relative: 21 %
Lymphs Abs: 1.8 10*3/uL (ref 0.7–4.0)
Monocytes Absolute: 0.6 10*3/uL (ref 0.1–1.0)
Monocytes Relative: 7 %
Neutro Abs: 6.1 10*3/uL (ref 1.7–7.7)
Neutrophils Relative %: 70 %

## 2017-05-15 LAB — I-STAT CHEM 8, ED
BUN: 21 mg/dL — ABNORMAL HIGH (ref 6–20)
Calcium, Ion: 1.12 mmol/L — ABNORMAL LOW (ref 1.15–1.40)
Chloride: 97 mmol/L — ABNORMAL LOW (ref 101–111)
Creatinine, Ser: 1.2 mg/dL (ref 0.61–1.24)
Glucose, Bld: 207 mg/dL — ABNORMAL HIGH (ref 65–99)
HCT: 33 % — ABNORMAL LOW (ref 39.0–52.0)
Hemoglobin: 11.2 g/dL — ABNORMAL LOW (ref 13.0–17.0)
Potassium: 3.6 mmol/L (ref 3.5–5.1)
Sodium: 136 mmol/L (ref 135–145)
TCO2: 28 mmol/L (ref 22–32)

## 2017-05-15 LAB — CBC
HCT: 32.7 % — ABNORMAL LOW (ref 39.0–52.0)
Hemoglobin: 11.1 g/dL — ABNORMAL LOW (ref 13.0–17.0)
MCH: 32.6 pg (ref 26.0–34.0)
MCHC: 33.9 g/dL (ref 30.0–36.0)
MCV: 96.2 fL (ref 78.0–100.0)
Platelets: 246 10*3/uL (ref 150–400)
RBC: 3.4 MIL/uL — ABNORMAL LOW (ref 4.22–5.81)
RDW: 13.4 % (ref 11.5–15.5)
WBC: 8.7 10*3/uL (ref 4.0–10.5)

## 2017-05-15 LAB — CBG MONITORING, ED: Glucose-Capillary: 202 mg/dL — ABNORMAL HIGH (ref 65–99)

## 2017-05-15 LAB — I-STAT TROPONIN, ED: Troponin i, poc: 0.04 ng/mL (ref 0.00–0.08)

## 2017-05-15 LAB — APTT: aPTT: 31 seconds (ref 24–36)

## 2017-05-15 MED ORDER — LOSARTAN POTASSIUM 50 MG PO TABS
100.0000 mg | ORAL_TABLET | Freq: Every day | ORAL | Status: DC
  Administered 2017-05-16 – 2017-05-18 (×3): 100 mg via ORAL
  Filled 2017-05-15 (×3): qty 2

## 2017-05-15 MED ORDER — NITROGLYCERIN 0.4 MG SL SUBL: 0.4000 mg | SUBLINGUAL_TABLET | SUBLINGUAL | Status: DC | PRN

## 2017-05-15 MED ORDER — ACETAMINOPHEN 160 MG/5ML PO SOLN: 650.0000 mg | ORAL | Status: DC | PRN

## 2017-05-15 MED ORDER — PANTOPRAZOLE SODIUM 40 MG PO TBEC
40.0000 mg | DELAYED_RELEASE_TABLET | Freq: Every day | ORAL | Status: DC
  Administered 2017-05-16 – 2017-05-18 (×3): 40 mg via ORAL
  Filled 2017-05-15 (×3): qty 1

## 2017-05-15 MED ORDER — ACETAMINOPHEN 325 MG PO TABS
650.0000 mg | ORAL_TABLET | ORAL | Status: DC | PRN
  Administered 2017-05-17: 650 mg via ORAL
  Filled 2017-05-15: qty 2

## 2017-05-15 MED ORDER — PRIMIDONE 50 MG PO TABS: 50.0000 mg | ORAL_TABLET | Freq: Two times a day (BID) | ORAL | Status: DC

## 2017-05-15 MED ORDER — SENNOSIDES-DOCUSATE SODIUM 8.6-50 MG PO TABS
1.0000 | ORAL_TABLET | Freq: Every evening | ORAL | Status: DC | PRN
Start: 2017-05-15 — End: 2017-05-18

## 2017-05-15 MED ORDER — POLYVINYL ALCOHOL 1.4 % OP SOLN: 1.0000 [drp] | Freq: Every day | OPHTHALMIC | Status: DC | PRN

## 2017-05-15 MED ORDER — LORATADINE 10 MG PO TABS
10.0000 mg | ORAL_TABLET | Freq: Every day | ORAL | Status: DC
  Administered 2017-05-16 – 2017-05-18 (×3): 10 mg via ORAL
  Filled 2017-05-15 (×3): qty 1

## 2017-05-15 MED ORDER — OMEGA-3-ACID ETHYL ESTERS 1 G PO CAPS
1.0000 g | ORAL_CAPSULE | Freq: Two times a day (BID) | ORAL | Status: DC
  Filled 2017-05-15 (×3): qty 1

## 2017-05-15 MED ORDER — IOPAMIDOL (ISOVUE-370) INJECTION 76%
INTRAVENOUS | Status: AC
  Administered 2017-05-15: 50 mL
  Filled 2017-05-15: qty 50

## 2017-05-15 MED ORDER — ATORVASTATIN CALCIUM 40 MG PO TABS
40.0000 mg | ORAL_TABLET | Freq: Every day | ORAL | Status: DC
  Administered 2017-05-16 – 2017-05-18 (×3): 40 mg via ORAL
  Filled 2017-05-15 (×3): qty 1

## 2017-05-15 MED ORDER — FUROSEMIDE 20 MG PO TABS
20.0000 mg | ORAL_TABLET | Freq: Every day | ORAL | Status: DC
  Administered 2017-05-16 – 2017-05-18 (×3): 20 mg via ORAL
  Filled 2017-05-15 (×3): qty 1

## 2017-05-15 MED ORDER — PROPRANOLOL HCL 80 MG PO TABS
80.0000 mg | ORAL_TABLET | Freq: Two times a day (BID) | ORAL | Status: DC
  Administered 2017-05-16 – 2017-05-18 (×5): 80 mg via ORAL
  Filled 2017-05-15 (×5): qty 1

## 2017-05-15 MED ORDER — SODIUM CHLORIDE 0.9 % IV SOLN
INTRAVENOUS | Status: DC
  Administered 2017-05-15: via INTRAVENOUS

## 2017-05-15 MED ORDER — PRIMIDONE 50 MG PO TABS
50.0000 mg | ORAL_TABLET | Freq: Two times a day (BID) | ORAL | Status: DC
  Administered 2017-05-16 – 2017-05-18 (×5): 50 mg via ORAL
  Filled 2017-05-15 (×5): qty 1

## 2017-05-15 MED ORDER — ACETAMINOPHEN 650 MG RE SUPP: 650.0000 mg | RECTAL | Status: DC | PRN

## 2017-05-15 MED ORDER — GABAPENTIN 600 MG PO TABS
300.0000 mg | ORAL_TABLET | Freq: Three times a day (TID) | ORAL | Status: DC
  Administered 2017-05-16 – 2017-05-18 (×7): 300 mg via ORAL
  Filled 2017-05-15 (×7): qty 1

## 2017-05-15 NOTE — ED Triage Notes (Signed)
Pt arrives to ED from [facility] with complaints of right sided facial droop, right sided weakness since 11pm last night. EMS reports pt had brain surgery 05/04/2017 to place a deep brain stimulator, during surgery pt's brain began to bleed and the surgery was not able to be completed. Stimulators were placed but not the batteries. Pt has hx of parkinson's. Per EMS, pt was able to hold a conversation en route to hospital. Pt aphasic upon arrival. Pt brought to CT scanner 2 at 1904, neurology bedside for evaluation.

## 2017-05-15 NOTE — Consult Note (Signed)
Referring Physician: Dr. Lynelle DoctorKnapp    Chief Complaint: Right sided weakness and facial droop  HPI: Brendan [name] is an 77 y.o. male presenting via EMS after staff at his rehab facility noted him to be weak on the right with right facial droop. He recently has had a neurosurgical procedure for placement of DBS electrodes (subthalamic stimulators) - uncertain the indication at this time, but he has a history of essential tremor, with no diagnosis of Parkinson's disease listed in Epic. Per report, the batteries for the DB stimulators have not yet been installed. Staples from the recent surgeries are still in place along the frontoparietal scalp bilaterally.   PMHx includes CAD s/p CABG x 3 in 2004, cervical spinal stenosis, CRPS of RUE, gait disorder, HLD, HTN, mild memory loss, peripheral neuropathy, essential tremor.   Home medications include ASA and atorvastatin.   LSN: [id number] on Monday tPA Given: No: Out of time window  Past Medical History:  Diagnosis Date  . Anxiety disorder   . Benign enlargement of prostate   . CAD in native artery 1997   Referred for CABG x3 in 2004 for LAD diagonal bifurcation lesion  . Cervical spinal stenosis   . Complex regional pain syndrome of right upper extremity    Right wrist; L arm  . Coronary atherosclerosis of artery bypass graft June 2013   Occluded SVG-D1; Cardiologist Dr. Herbie BaltimoreHarding  . Gait disorder   . Gastroesophageal reflux disease   . Glaucoma   . Hiatal hernia    GI: Dr Evette CristalGanem  . Hyperlipidemia LDL goal <70   . Hypertension   . Memory loss    Mild  . Obesity   . Peripheral neuropathy    possible peripheral neuropathy  . S/P CABG x 3 2004    LIMA-LAD, SVG to diagonal, SVG to OM  . Tremor, essential    On Primidone  . Unstable angina pectoris [facility](HCC)  June 2013   Cardiac cath: occluded SVG-DI. Patent LIMA-LAD and SVG-OM. EF 45% with apical inferior HK.    Past Surgical History:  Procedure Laterality Date  . CARDIAC CATHETERIZATION    .  CATARACT EXTRACTION, BILATERAL    . CORONARY ARTERY BYPASS GRAFT  06/2002   LIMA-LAD, SVG-D1, SVG-OM  . EYE SURGERY    . KNEE SURGERY Left   . LEFT HEART CATHETERIZATION WITH CORONARY/GRAFT ANGIOGRAM N/A 10/13/2011   Procedure: LEFT HEART CATHETERIZATION WITH Isabel CapriceORONARY/GRAFT ANGIOGRAM;  Surgeon: Marykay Lexavid W Harding, MD;  [facility];  Service: Cardiovascular;  Laterality: N/A;  . MINOR PLACEMENT OF FIDUCIAL N/A 04/26/2017   Procedure: Fiducial placement;  Surgeon: Maeola HarmanStern, Joseph, MD;  [facility];  Service: Neurosurgery;  Laterality: N/A;  Fiducial placement  . NM MYOCAR PERF WALL MOTION  03/23/2009   protocol:Bruce, normal perfusion in all regions, post-stress EF 72%, exercise capacity 7METS, EKG negative for ischemia.  . Shoulder Orthoscopic Surgery   06/2003  . SUBTHALAMIC STIMULATOR INSERTION Bilateral 05/04/2017   Procedure: Bilateral Deep brain stimulator placement;  Surgeon: Maeola HarmanStern, Joseph, MD;  [facility];  Service: Neurosurgery;  Laterality: Bilateral;  Bilateral deep brain stimulator placement  . TRANSTHORACIC ECHOCARDIOGRAM  April 2014   EF 55-60%; no regional wall motion abnormalities. Relatively normal study normal diastolic parameters/abnormal relaxation  . wrist ganglion cyst Left     Family History  Problem Relation Age of Onset  . Heart disease Mother   . Heart disease Brother   . Tremor Paternal Uncle   . Heart disease Brother   .  Drug abuse Daughter   . Hepatitis C Daughter    Social History:  reports that he quit smoking about 49 years ago. His smoking use included cigarettes. He has a 60.00 pack-year smoking history. he has never used smokeless tobacco. He reports that he does not drink alcohol or use drugs.  Allergies:  Allergies  Allergen Reactions  . Lisinopril Cough    Home Medications:    ROS: In the context of expressive and receptive dysphasia, the patient cannot provide a reliable review of systems. He does not appear to be in pain.    Physical Examination: There were no vitals taken for this visit.  HEENT: Surgical staples to frontoparietal scalp noted bilaterally Lungs: Respirations unlabored Ext: Warm and well perfused  Neurologic Examination: Mental Status: Awake and alert. Expressive > receptive dysphasia noted. Has difficulty naming common objects, pausing and appearing to be frustrated as he attempts to recall words. Some speech output is fluent and at other times speech is halting. Has difficulty following some commands and answering questions, often requiring repetition of the command/question by examiner.  Cranial Nerves: II:  Visual fields grossly intact to threat bilateraly; does not demonstrate understanding of more complex commands for visual field testing. Pupils equal and round.  III,IV, VI: EOMI full to left and right with saccadic pursuits noted. No nystagmus.  V,VII: Mild right facial droop. States "no" when asked if he feels FT to face bilaterally, but reacts to both stimuli. Also grimaces to bilateral facial noxious stimuli.  VIII: hearing intact to some commands IX,X: Unable to visualize palate XI: Head at midline XII: midline tongue extension  Motor: Increased latency of motor responses x 4. Mild limb rigidity x 4. Decreased amplitude of movements of RUE and RLE relative to left, but without drift. Prominent tremor to LUE and distal RLE noted intermittently.  RUE 4/5 proximal and distal RLE 4+/5 proximal and distal LUE: 5/5 proximal and distal LLE 5/5 proximal and distal Sensory: Reacts to noxious stimuli proximally in all 4 limbs.  Deep Tendon Reflexes:  Upper extremities: Difficult to assess due to rigidity Bilateral patellae 2+ Bilateral achilles 0 Toes downgoing bilaterally.  Cerebellar: Difficulty following commands for FNF. No gross ataxia noted.  Gait: Deferred due to falls risk concerns  Results for orders placed or performed during the hospital encounter of 05/15/17 (from the past  48 hour(s))  CBC     Status: Abnormal   Collection Time: 05/15/17  6:50 PM  Result Value Ref Range   WBC 8.7 4.0 - 10.5 K/uL   RBC 3.40 (L) 4.22 - 5.81 MIL/uL   Hemoglobin 11.1 (L) 13.0 - 17.0 g/dL   HCT 96.0 (L) 45.4 - 09.8 %   MCV 96.2 78.0 - 100.0 fL   MCH 32.6 26.0 - 34.0 pg   MCHC 33.9 30.0 - 36.0 g/dL   RDW 11.9 14.7 - 82.9 %   Platelets 246 150 - 400 K/uL  Differential     Status: None   Collection Time: 05/15/17  6:50 PM  Result Value Ref Range   Neutrophils Relative % 70 %   Neutro Abs 6.1 1.7 - 7.7 K/uL   Lymphocytes Relative 21 %   Lymphs Abs 1.8 0.7 - 4.0 K/uL   Monocytes Relative 7 %   Monocytes Absolute 0.6 0.1 - 1.0 K/uL   Eosinophils Relative 2 %   Eosinophils Absolute 0.2 0.0 - 0.7 K/uL   Basophils Relative 0 %   Basophils Absolute 0.0 0.0 - 0.1 K/uL  CBG monitoring, ED     Status: Abnormal   Collection Time: 05/15/17  7:02 PM  Result Value Ref Range   Glucose-Capillary 202 (H) 65 - 99 mg/dL   Comment 1 Notify RN    Comment 2 Document in Chart   I-Stat Chem 8, ED     Status: Abnormal   Collection Time: 05/15/17  7:04 PM  Result Value Ref Range   Sodium 136 135 - 145 mmol/L   Potassium 3.6 3.5 - 5.1 mmol/L   Chloride 97 (L) 101 - 111 mmol/L   BUN 21 (H) 6 - 20 mg/dL   Creatinine, Ser 4.09 0.61 - 1.24 mg/dL   Glucose, Bld 811 (H) 65 - 99 mg/dL   Calcium, Ion 9.14 (L) 1.15 - 1.40 mmol/L   TCO2 28 22 - 32 mmol/L   Hemoglobin 11.2 (L) 13.0 - 17.0 g/dL   HCT 78.2 (L) 95.6 - 21.3 %   No results found.  Assessment: 77 y.o. male presenting with new onset of right facial droop and right sided weakness 1. Not a tPA candidate due to time criteria 2. No LVO seen on CTA head and neck 3. RUE and RLE deficits more consistent with bradykinesia and mild weakness secondary to basal ganglia pathology due to edema from prior hemorrhage and/or traumatic changes from recent surgery. The edema is worse on the left than the right on the CT. The small previously seen  hematoma has essentially resolved, but edema persists and is prominent.  4. Stroke Risk Factors - CAD, HLD, HTN  Plan: 1. Hold off on MRI brain unless Neurosurgeon clears (based on time from surgery as well as clarification that the DBS electrodes are not ferromagnetic).  2. PT consult, OT consult, Speech consult 3. Continue ASA and atorvastatin 4. Telemetry monitoring 5. Frequent neuro checks 6. PO or gentle IV hydration 7. Contact his Neurosurgeon in the AM  @[email or web address]  signed: Dr. Caryl Pina  05/15/2017, 7:12 PM

## 2017-05-15 NOTE — H&P (Signed)
Family Medicine Teaching [facility] Admission History and Physical Service Pager: [phone]  Patient name: Brendan [name] Medical record number: [id number] Date of birth: 08/05/1940 Age: 77 y.o. Gender: male  Primary Care Provider: [name], MD Consultants: Neurology  Code Status: full  Chief Complaint: Right sided weakness  Assessment and Plan: Brendan [name] is a 77 y.o. male presenting with 12-24 hours of right sided weakness with the RUE extremity being far weaker. Of note the patient had a deep brain stimulator placed on 1/18.  RUE, RLE weakness  Mild aphasia  cognitive slowing CT scan negative for large bleed, and edema filling space previously occupied by hematoma. Given that some impairment is expected in the acute phase after having a DBS placed some of these symptoms are likely 2/2 to the procedure. Unclear why patient had acute onset right sided weakness. Weakness is more profound in RUE. He does have 4/5 strength and can even get to 5/5 strength with enough coaching, but this appears to be a large change per his daughter's report. Unclear if this is a combination of rigidity and slowing due to expected trauma to Basal Ganglia 2/2 procedure, mild ischemic stroke, edema causing compression of the basal ganglia, or another process entirely. Next best step will be MRI but will need clearance from neurosurgeon prior to having this imaging done. The last thing to consider is a possible musculoskeletal problem occurring separate from neurologic distractor. This etiology appears to be highly unlikely but is something to consider. - admit to inpatient, Dr. Lum Babe, telemetry - vital signs per floor routine - Follow up Neurology recs - Discuss case with Neurosurgery in AM - MRI in AM when cleared by Neurosurgery - gabapentin 300mg  tid (Decreased home dose) - Primidone 50mg  bid (supposed be on this per his neurologist Dr. Arbutus Leas) - Fall precautions - Up with Assistance - NPO until  passes swallow, NS @ 100cc/hr while NPO - PT/OT/SLP eval and treat - Social work consult for placement back at rehab when ready - Head of bed elevated 30 degrees until passes swallow - Neuro checks q2 for 12 hours, then every 4 hour - A1c, Lipid panel not performed as recently done earlier this month - Consider repeating echocardiogram - ASA 81 - Permissive HTN up to 220/120 if MRI reveals stroke - SCDs   Essential tremor S/P DBS placement. Per daughter's report, neurosurgery and neurology teams would like for patient to be closer to baseline prior to placing batteries and starting stimulator. Will continue gabapentin and primidone while here. Management per neurology. - gabapentin 300mg  tid - primidone 50mg  bid - follow up neurology and neurosurgery recs   Hypertension BP of 129/84 on admission. 147/69 at most recent check. Takes cozaar, lasix, propranolol, nitrostat as home meds. No renal problems, will continue all of these home meds as he has been taking them at snf. - continue cozaar 100mg  daily - continue lasix 20mg  daily - continue propranolol 80mg  daily  - nitrostat as needed for chest pain - Permissive HTN up to 220/120 if MRI reveals stroke  S/P CABG x3 and abnormal EKG Patient appears to have had a myoview in 2017 which was good. EF 59% at that visit. Does have trace to 1+ pitting edema in lower extremities. No sense or indication that patient is volume overloaded at this time. Nitrostat on board for chest pain.No change in management from outpatient regimen will need to follow up with cardiologist at next scheduled appointment. EKG poor quality but is showing RBBB  and LAFB, Anteroseptal infarct, old Nonspecific T abnormalities, lateral leads, Minimal ST elevation, lateral leads - Will trend troponins given reading of minimal ST elevation - Telemetry as above - Repeat EKG if he has any chest pain  HLD Last lipid panel 05/01/2017, cholesterol, HDL, LDL w/n/l.  -Can continue  home atorvastatin 40mg  daily  GERD -Continue protonix 40mg  daily  Glaucoma  -Continue liquifilm eye drops  Normocytic Anemia: CBC significant for hgb of 11.1, down from 13.4 pre-op on 1/9. Unclear if he had blood loss from his surgery.  - daily CBC - can consider anemia panel in the future   FEN/GI: NPO until passes swallow study Prophylaxis: SCDs, protonix  Disposition: Admit to family medicine, med surg, attending Dr. Lum Babe  History of Present Illness:  Brendan [name] is a 77 y.o. male with PMH is significant for CABG x3, Essential tremor, GERD, glaucoma, HTN, Obesity,  Urinary retention, seborrheic dermatitis presenting on 1/29 with right sided weakness and expressive aphasia.   The patient's daughter said that she first noticed his right arm becoming weaker on 1/28 to the point to where he could not move that arm at all. Concurrently he would "have trouble getting the proper words out". Patient is currently in a rehab facility ([facility]) after having a deep brain stimulator placed on 1/18 secondary to an essential tremor. Per reports his rehab facility grew concerned with his developing weakness and sent him to the ED in the afternoon of 1/29. In the ED a code stroke was fired. He underwent CT head and CTA which were negative for large vessel occlusion. These scans did show evolution of a known small hematoma that the patient developed after his deep brain stimulator. Per the read it is felt that the hematoma has been resorbed with underlying vasogenic edema filling that space.  As mentioned above the patient had a DBS placed by Dr. [name] on 05/04/17 for tremors. Per reports from the patient's daughter, since having the DBS placed he has had some cognitive slowing, mild rigidity, and a drop in his baseline mental status. From the informed consent this was felt to be a normal acute sequale from the procedure. Neurosurgery and Neurology teams were waiting for patient to return closer to  baseline before placing batteries and turning device on. Although a little unclear it appears that the patient was to be on gabapentin and primidone in the meantime.   Per daughter's report the patient's baseline is essentially normal aside from the tremors. He was able to do all ADLs and was cognitively at baseline prior to procedure. He did need some help getting around the house. Due to the tremor he has difficulty feeding himself and writing as he is left hand dominant.  Regarding the rest of the ED workup a cmp, cbc, ua, UDS, ekg, were obtained. BMP significant for glucose of 207, with everything else essentially within normal limits. CBC only significant for hgb of 11.1, down from 13.4 pre-op on 1/9. No other abnormality noted. UA, UDS both normal aside from positive barbituates 2/2 to primidone use. EKG showed a known Left anterior fascicular block and possible RBBB. It is a poor quality study likely 2/2 to patient tremor. Patient evaluated by neurology in ED. Felt that symptoms consistent with DBS placement but cant rule out stroke at this point. Recommend MRI but will need to check with NSG to make sure ok for patient to get MRI with DBS.  Review Of Systems: Per HPI with the following additions:  Review of Systems  Constitutional: Negative for chills and fever.  Respiratory: Negative for cough.   Cardiovascular: Negative for chest pain and palpitations.  Gastrointestinal: Negative for constipation, diarrhea, nausea and vomiting.  Neurological: Positive for tremors and focal weakness. Negative for seizures, loss of consciousness and weakness.    Patient Active Problem List   Diagnosis Date Noted  . Weakness 05/15/2017  . Coronary artery disease involving native coronary artery of native heart without angina pectoris   . Glaucoma   . Anxiety state   . Cognitive disorder   . Urinary retention   . Traumatic hemorrhage of left cerebrum without loss of consciousness (HCC)   . Pressure  injury of skin 05/05/2017  . Seborrheic dermatitis of scalp 10/05/2016  . Tremor, essential 10/03/2016  . Cough 06/27/2016  . Rhinitis, allergic 01/12/2014  . CHRONIC STABLE ANGINA 08/06/2013  . Obesity (BMI 30-39.9) 01/30/2013  . Lower extremity edema 01/30/2013  . S/P CABG x 3   . Atherosclerotic heart disease of native coronary artery with angina pectoris (HCC)   . Hyperlipidemia LDL goal <70   . Memory problem 07/01/2012  . Complex regional pain syndrome 05/24/2012  . Pleural plaque with presence of asbestos 05/15/2012  . Anemia, B12 deficiency 03/19/2012  . Atherosclerotic heart disease of artery bypass graft 09/16/2011  . BPH (benign prostatic hyperplasia) 08/08/2011  . Microscopic hematuria 02/27/2011  . Anxiety 10/03/2010  . Incontinence 07/21/2010  . ANGINA, STABLE 04/29/2008  . GERD 10/28/2007  . Essential tremor 08/30/2006  . HYPERTENSION, BENIGN 07/06/2006    Past Medical History: Past Medical History:  Diagnosis Date  . Anxiety disorder   . Benign enlargement of prostate   . CAD in native artery 1997   Referred for CABG x3 in 2004 for LAD diagonal bifurcation lesion  . Cervical spinal stenosis   . Complex regional pain syndrome of right upper extremity    Right wrist; L arm  . Coronary atherosclerosis of artery bypass graft June 2013   Occluded SVG-D1; Cardiologist Dr. Herbie Baltimore  . Gait disorder   . Gastroesophageal reflux disease   . Glaucoma   . Hiatal hernia    GI: Dr Evette Cristal  . Hyperlipidemia LDL goal <70   . Hypertension   . Memory loss    Mild  . Obesity   . Peripheral neuropathy    possible peripheral neuropathy  . S/P CABG x 3 2004    LIMA-LAD, SVG to diagonal, SVG to OM  . Tremor, essential    On Primidone  . Unstable angina pectoris [facility])  June 2013   Cardiac cath: occluded SVG-DI. Patent LIMA-LAD and SVG-OM. EF 45% with apical inferior HK.    Past Surgical History: Past Surgical History:  Procedure Laterality Date  . CARDIAC  CATHETERIZATION    . CATARACT EXTRACTION, BILATERAL    . CORONARY ARTERY BYPASS GRAFT  06/2002   LIMA-LAD, SVG-D1, SVG-OM  . EYE SURGERY    . KNEE SURGERY Left   . LEFT HEART CATHETERIZATION WITH CORONARY/GRAFT ANGIOGRAM N/A 10/13/2011   Procedure: LEFT HEART CATHETERIZATION WITH Isabel Caprice;  Surgeon: Marykay Lex, MD;  Location: [facility] CATH LAB;  Service: Cardiovascular;  Laterality: N/A;  . MINOR PLACEMENT OF FIDUCIAL N/A 04/26/2017   Procedure: Fiducial placement;  Surgeon: Maeola Harman, MD;  Location: [facility] OR;  Service: Neurosurgery;  Laterality: N/A;  Fiducial placement  . NM MYOCAR PERF WALL MOTION  03/23/2009   protocol:Bruce, normal perfusion in all regions, post-stress EF 72%, exercise capacity , EKG  negative for ischemia.  . Shoulder Orthoscopic Surgery   06/2003  . SUBTHALAMIC STIMULATOR INSERTION Bilateral 05/04/2017   Procedure: Bilateral Deep brain stimulator placement;  Surgeon: Maeola HarmanStern, Joseph, MD;  [facility];  Service: Neurosurgery;  Laterality: Bilateral;  Bilateral deep brain stimulator placement  . TRANSTHORACIC ECHOCARDIOGRAM  April 2014   EF 55-60%; no regional wall motion abnormalities. Relatively normal study normal diastolic parameters/abnormal relaxation  . wrist ganglion cyst Left     Social History: Social History   Tobacco Use  . Smoking status: Former Smoker    Packs/day: 3.00    Years: 20.00    Pack years: 60.00    Types: Cigarettes    Last attempt to quit: 04/17/1968    Years since quitting: 49.1  . Smokeless tobacco: Never Used  Substance Use Topics  . Alcohol use: No    Frequency: Never    Comment: Occasional 2 mixed drinks or 3 beers.  . Drug use: No   Please also refer to relevant sections of EMR.  Family History: Family History  Problem Relation Age of Onset  . Heart disease Mother   . Heart disease Brother   . Tremor Paternal Uncle   . Heart disease Brother   . Drug abuse Daughter   . Hepatitis C Daughter      Allergies and Medications: Allergies  Allergen Reactions  . Lisinopril Cough   Current Facility-Administered Medications on File Prior to Encounter  Medication Dose Route Frequency Provider Last Rate Last Dose  . 0.9 %  sodium chloride infusion   Intravenous Continuous Marykay LexHarding, David W, MD      . sodium chloride 0.9 % injection 3 mL  3 mL Intravenous PRN Marykay LexHarding, David W, MD       Current Outpatient Medications on File Prior to Encounter  Medication Sig Dispense Refill  . atorvastatin (LIPITOR) 40 MG tablet Take 1 tablet (40 mg total) by mouth daily. 90 tablet 3  . fish oil-omega-3 fatty acids 1000 MG capsule Take 1 g by mouth 2 (two) times daily.    . furosemide (LASIX) 20 MG tablet Take 1 tablet (20 mg total) by mouth daily. May take an  Extra dose if needed (Patient taking differently: Take 20 mg by mouth daily. ) 35 tablet 11  . HYDROcodone-acetaminophen (NORCO/VICODIN) 5-325 MG tablet Take 1 tablet by mouth every 6 (six) hours as needed for moderate pain.    [name]. ketoconazole (NIZORAL) 2 % shampoo Apply 1 application topically 2 (two) times a week. (Patient taking differently: Apply 1 application topically once a week. ) 120 mL 1  . loratadine (CLARITIN) 10 MG tablet Take 1 tablet (10 mg total) by mouth daily. 30 tablet 11  . losartan (COZAAR) 100 MG tablet Take 1 tablet (100 mg total) by mouth daily. 90 tablet 3  . nitroGLYCERIN (NITROSTAT) 0.4 MG SL tablet Place 1 tablet (0.4 mg total) under the tongue every 5 (five) minutes as needed for chest pain. call 911 if chest pain not better 30 tablet 3  . omeprazole (PRILOSEC) 20 MG capsule Take 1 capsule (20 mg total) by mouth daily as needed (acid reflux). 90 capsule 3  . propranolol (INDERAL) 80 MG tablet Take 1 tablet (80 mg total) by mouth 2 (two) times daily. 180 tablet 5  . Propylene Glycol (SYSTANE BALANCE) 0.6 % SOLN Place 1 drop into both eyes daily as needed (for dry eyes).    . sennosides-docusate sodium (SENOKOT-S) 8.6-50 MG  tablet Take 1 tablet by mouth  daily as needed for constipation.    . gabapentin (NEURONTIN) 300 MG capsule Take 2 capsules (600 mg total) by mouth 3 (three) times daily. (Patient taking differently: Take 600 mg by mouth at bedtime. x3 days. Then stop on 05/18/17.) 180 capsule 5  . primidone (MYSOLINE) 250 MG tablet Take 250 mg by mouth 2 (two) times daily.    . primidone (MYSOLINE) 50 MG tablet 04/19/17: 250 mg am, 200 mg hs; 04/20/17: 200 mg am, 200 mg hs, 04/21/17: 200 mg am, 150 mg hs; 1/6-1/7: 150 mg BID; 1/8-1/9: 150 mg am, 100 mg hs; 1/10-1/11: 100 mg BID; 1/12-1/13: 100 mg am, 50 mg hs; 1/14: 50 mg BID, 1/15: 50 mg QD, 1/16: Stop (Patient not taking: Reported on 05/15/2017) 60 tablet 0    Objective: BP (!) 145/74   Pulse 83   Temp 99.3 F (37.4 C)   Resp 18   Ht 5\' 6"  (1.676 m)   Wt 202 lb 13.2 oz (92 kg)   SpO2 96%   BMI 32.74 kg/m  Exam: General: aox1 (self), elderly obese, white male resting comfortably in bed. Noted to have resting tremor in both left arm and right leg Eyes: eomi, perrla, no conjunctival injection ENTM: bilateral ears and nose within any external trauma, no tongue deviation Neck: full range of motion, 5/5 strength on rotation of neck to left and right Cardiovascular: rrr, no m/r/g. Palpable dp bilaterally, palpable radial pulse bilaterally. Trace to 1+ edema in BLE. Respiratory: lungs clear to ausculation bilaterally, no chest pain Gastrointestinal: soft, mild distention, no tenderness, obese, bowel sounds present MSK:  RUE: 4/5 strength on flexion and extension of RUE, 5/5 grip strength, some rigidity noted in RUE RLE: 5/5 strength on extension of RLE, 5/5 with plantar flexion and dorsiflexion of R foot, resting tremor noted with activity and at rest, worse with activity LLE: 5/5 strength on flexion and extension of LLE, 5/5 strength plantar and dorsiflexion LUE: 5/5 strength on extension and flexion LUE, 5/5 grip strength. Tremor noted at rest and with activity,  worse with activity. Derm: warm and dry. Well healed C shaped incisional scar on left frontal head Neuro: Oriented to self, not place or time. Weakness in RUE as above, loss of sensation to light touch in all extremities and face, winces in pain in all areas. Unable to shrug right shoulder, otherwise CN 2-12 intact. Psych: pleasant, appropriate  Labs and Imaging: CBC BMET  Recent Labs  Lab 05/15/17 1850 05/15/17 1904  WBC 8.7  --   HGB 11.1* 11.2*  HCT 32.7* 33.0*  PLT 246  --    Recent Labs  Lab 05/15/17 1850 05/15/17 1904  NA 134* 136  K 3.6 3.6  CL 97* 97*  CO2 24  --   BUN 20 21*  CREATININE 1.33* 1.20  GLUCOSE 214* 207*  CALCIUM 8.7*  --      Ct Angio Head W Or Wo Contrast  Result Date: 05/15/2017 CLINICAL DATA:  Code stroke. Initial evaluation for acute right-sided weakness, aphasia. EXAM: CT ANGIOGRAPHY HEAD AND NECK TECHNIQUE: Multidetector CT imaging of the head and neck was performed using the standard protocol during bolus administration of intravenous contrast. Multiplanar CT image reconstructions and MIPs were obtained to evaluate the vascular anatomy. Carotid stenosis measurements (when applicable) are obtained utilizing NASCET criteria, using the distal internal carotid diameter as the denominator. CONTRAST:  <See Chart> ISOVUE-370 IOPAMIDOL (ISOVUE-370) INJECTION 76% COMPARISON:  Prior CT from 05/06/2017. FINDINGS: CT HEAD FINDINGS Brain: Bilateral DBS  implants in place, tips near the subthalamic nuclei. There has been interval evolution of previously seen parenchymal hematoma along the left DBS, with increased edema now seen within this region, although no acute blood products are now identified (series 3, image 22). A superimposed infarct would be difficult to exclude. No other acute intracranial hemorrhage. No other acute large vessel territory infarct. No mass lesion or midline shift. No hydrocephalus. No discernible extra-axial fluid collection. Few scattered foci  of postoperative pneumocephalus remain, decreased from previous. Vascular: No hyperdense vessel. Skull: Scalp soft tissues and calvarium demonstrate no acute abnormality. DBS implants noted. Sinuses: Globes and orbital soft tissues within normal limits. Patient status post lens extraction bilaterally. Paranasal sinuses and mastoid air cells are clear. Orbits: None. ASPECTS (Alberta Stroke Program Early CT Score) - Ganglionic level infarction (caudate, lentiform nuclei, internal capsule, insula, M1-M3 cortex): 7 - Supraganglionic infarction (M4-M6 cortex): 3 Total score (0-10 with 10 being normal): 10 Review of the MIP images confirms the above findings CTA NECK FINDINGS Aortic arch: Visualized aortic arch of normal caliber with normal branch pattern. 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 No hemodynamically significant stenosis about the origin of the great vessels. Visualized subclavian arteries widely patent. Right carotid system: Right common carotid artery widely patent to the bifurcation. Minimal centric calcified plaque about the right bifurcation without significant stenosis. Right ICA widely patent distally to the skull base without stenosis, dissection, or occlusion. Left carotid system: Left common carotid artery patent from its origin to the bifurcation without stenosis. Mild scattered calcified plaque about the left bifurcation without hemodynamically significant stenosis. Left ICA patent to the skull base without stenosis, dissection, or occlusion. Vertebral arteries: Both of the vertebral arteries arise from the subclavian arteries. Right vertebral artery dominant. Focal plaque at the origin of the vertebral arteries bilaterally with moderate stenosis on the right and more severe stenosis on the left. Vertebral arteries otherwise widely patent within the neck without stenosis, dissection, or occlusion. Skeleton: No acute osseous abnormality. No worrisome lytic or blastic osseous  lesions. Endplate occipital assimilation noted. 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 Other neck: No acute soft tissue abnormality within the neck. Salivary glands normal. Thyroid normal. No adenopathy. Upper chest: Visualized upper chest demonstrates no acute abnormality. Irregular pleural thickening noted at the anterolateral right upper lobe. Partially visualized lungs are otherwise clear. Review of the MIP images confirms the above findings CTA HEAD FINDINGS Anterior circulation: Petrous segments widely patent bilaterally. Smooth calcified plaque throughout the cavernous ICAs with mild diffuse narrowing. Supraclinoid segments widely patent. ICA termini widely patent. A1 segments patent. Anterior communicating artery patent. Anterior cerebral arteries widely patent to their distal aspects without stenosis. No M1 occlusion or stenosis. No proximal M2 occlusion. Distal MCA branches well perfused and symmetric. Posterior circulation: Right vertebral artery dominant. Multifocal atheromatous plaque within the V4 segments bilaterally with moderate stenosis on the right and multifocal severe stenoses on the left. Posterior inferior cerebral arteries patent bilaterally. Basilar artery widely patent to its distal aspect. Superior cerebral arteries patent bilaterally. Both of the posterior cerebral arteries primarily supplied via the basilar and are well perfused to their distal aspects. Small bilateral posterior communicating arteries noted. Venous sinuses: Patent. Anatomic variants: None significant.  No aneurysm. Delayed phase: Not performed. Review of the MIP images confirms the above findings IMPRESSION: 1. Negative CTA for emergent large vessel occlusion. 2. Interval evolution of recently seen intraparenchymal hematoma positioned within the left frontal lobe along the left DBS, with increased regional edema  on today's exam without discernible acute blood products. Edema is largely vasogenic in  appearance, and favored to be related to the recently identified evolving hematoma. Despite this, a superimposed small infarct in this region would be extremely difficult to exclude. ASPECTS = 10 3. Mild atherosclerotic change about the carotid bifurcations and carotid siphons without hemodynamically significant or correctable stenosis. 4. Atheromatous plaque at the origin of the vertebral arteries as well as the bilateral V4 segments with resultant moderate stenoses on the right and more severe stenosis on the left. Critical Value/emergent results were called by telephone at the time of interpretation on 05/15/2017 at 7:44 pm to Dr. Otelia Limes, who verbally acknowledged these results. Electronically Signed   By: Rise Mu M.D.   On: 05/15/2017 20:22   Ct Angio Neck W Or Wo Contrast  Result Date: 05/15/2017 CLINICAL DATA:  Code stroke. Initial evaluation for acute right-sided weakness, aphasia. EXAM: CT ANGIOGRAPHY HEAD AND NECK TECHNIQUE: Multidetector CT imaging of the head and neck was performed using the standard protocol during bolus administration of intravenous contrast. Multiplanar CT image reconstructions and MIPs were obtained to evaluate the vascular anatomy. Carotid stenosis measurements (when applicable) are obtained utilizing NASCET criteria, using the distal internal carotid diameter as the denominator. CONTRAST:  <See Chart> ISOVUE-370 IOPAMIDOL (ISOVUE-370) INJECTION 76% COMPARISON:  Prior CT from 05/06/2017. FINDINGS: CT HEAD FINDINGS Brain: Bilateral DBS implants in place, tips near the subthalamic nuclei. There has been interval evolution of previously seen parenchymal hematoma along the left DBS, with increased edema now seen within this region, although no acute blood products are now identified (series 3, image 22). A superimposed infarct would be difficult to exclude. No other acute intracranial hemorrhage. No other acute large vessel territory infarct. No mass lesion or midline  shift. No hydrocephalus. No discernible extra-axial fluid collection. Few scattered foci of postoperative pneumocephalus remain, decreased from previous. Vascular: No hyperdense vessel. Skull: Scalp soft tissues and calvarium demonstrate no acute abnormality. DBS implants noted. Sinuses: Globes and orbital soft tissues within normal limits. Patient status post lens extraction bilaterally. Paranasal sinuses and mastoid air cells are clear. Orbits: None. ASPECTS (Alberta Stroke Program Early CT Score) - Ganglionic level infarction (caudate, lentiform nuclei, internal capsule, insula, M1-M3 cortex): 7 - Supraganglionic infarction (M4-M6 cortex): 3 Total score (0-10 with 10 being normal): 10 Review of the MIP images confirms the above findings CTA NECK FINDINGS Aortic arch: Visualized aortic arch of normal caliber with normal branch pattern. 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 No hemodynamically significant stenosis about the origin of the great vessels. Visualized subclavian arteries widely patent. Right carotid system: Right common carotid artery widely patent to the bifurcation. Minimal centric calcified plaque about the right bifurcation without significant stenosis. Right ICA widely patent distally to the skull base without stenosis, dissection, or occlusion. Left carotid system: Left common carotid artery patent from its origin to the bifurcation without stenosis. Mild scattered calcified plaque about the left bifurcation without hemodynamically significant stenosis. Left ICA patent to the skull base without stenosis, dissection, or occlusion. Vertebral arteries: Both of the vertebral arteries arise from the subclavian arteries. Right vertebral artery dominant. Focal plaque at the origin of the vertebral arteries bilaterally with moderate stenosis on the right and more severe stenosis on the left. Vertebral arteries otherwise widely patent within the neck without stenosis, dissection, or  occlusion. Skeleton: No acute osseous abnormality. No worrisome lytic or blastic osseous lesions. Endplate occipital assimilation noted. 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 Other neck:  No acute soft tissue abnormality within the neck. Salivary glands normal. Thyroid normal. No adenopathy. Upper chest: Visualized upper chest demonstrates no acute abnormality. Irregular pleural thickening noted at the anterolateral right upper lobe. Partially visualized lungs are otherwise clear. Review of the MIP images confirms the above findings CTA HEAD FINDINGS Anterior circulation: Petrous segments widely patent bilaterally. Smooth calcified plaque throughout the cavernous ICAs with mild diffuse narrowing. Supraclinoid segments widely patent. ICA termini widely patent. A1 segments patent. Anterior communicating artery patent. Anterior cerebral arteries widely patent to their distal aspects without stenosis. No M1 occlusion or stenosis. No proximal M2 occlusion. Distal MCA branches well perfused and symmetric. Posterior circulation: Right vertebral artery dominant. Multifocal atheromatous plaque within the V4 segments bilaterally with moderate stenosis on the right and multifocal severe stenoses on the left. Posterior inferior cerebral arteries patent bilaterally. Basilar artery widely patent to its distal aspect. Superior cerebral arteries patent bilaterally. Both of the posterior cerebral arteries primarily supplied via the basilar and are well perfused to their distal aspects. Small bilateral posterior communicating arteries noted. Venous sinuses: Patent. Anatomic variants: None significant.  No aneurysm. Delayed phase: Not performed. Review of the MIP images confirms the above findings IMPRESSION: 1. Negative CTA for emergent large vessel occlusion. 2. Interval evolution of recently seen intraparenchymal hematoma positioned within the left frontal lobe along the left DBS, with increased regional edema on  today's exam without discernible acute blood products. Edema is largely vasogenic in appearance, and favored to be related to the recently identified evolving hematoma. Despite this, a superimposed small infarct in this region would be extremely difficult to exclude. ASPECTS = 10 3. Mild atherosclerotic change about the carotid bifurcations and carotid siphons without hemodynamically significant or correctable stenosis. 4. Atheromatous plaque at the origin of the vertebral arteries as well as the bilateral V4 segments with resultant moderate stenoses on the right and more severe stenosis on the left. Critical Value/emergent results were called by telephone at the time of interpretation on 05/15/2017 at 7:44 pm to Dr. Otelia Limes, who verbally acknowledged these results. Electronically Signed   By: Rise Mu M.D.   On: 05/15/2017 20:22   Ct Head Code Stroke Wo Contrast  Result Date: 05/15/2017 CLINICAL DATA:  Code stroke. Initial evaluation for acute right-sided weakness, aphasia. EXAM: CT ANGIOGRAPHY HEAD AND NECK TECHNIQUE: Multidetector CT imaging of the head and neck was performed using the standard protocol during bolus administration of intravenous contrast. Multiplanar CT image reconstructions and MIPs were obtained to evaluate the vascular anatomy. Carotid stenosis measurements (when applicable) are obtained utilizing NASCET criteria, using the distal internal carotid diameter as the denominator. CONTRAST:  <See Chart> ISOVUE-370 IOPAMIDOL (ISOVUE-370) INJECTION 76% COMPARISON:  Prior CT from 05/06/2017. FINDINGS: CT HEAD FINDINGS Brain: Bilateral DBS implants in place, tips near the subthalamic nuclei. There has been interval evolution of previously seen parenchymal hematoma along the left DBS, with increased edema now seen within this region, although no acute blood products are now identified (series 3, image 22). A superimposed infarct would be difficult to exclude. No other acute intracranial  hemorrhage. No other acute large vessel territory infarct. No mass lesion or midline shift. No hydrocephalus. No discernible extra-axial fluid collection. Few scattered foci of postoperative pneumocephalus remain, decreased from previous. Vascular: No hyperdense vessel. Skull: Scalp soft tissues and calvarium demonstrate no acute abnormality. DBS implants noted. Sinuses: Globes and orbital soft tissues within normal limits. Patient status post lens extraction bilaterally. Paranasal sinuses and mastoid air cells are clear. Orbits:  None. ASPECTS ([place]berta Stroke Program Early CT Score) - Ganglionic level infarction (caudate, lentiform nuclei, internal capsule, insula, M1-M3 cortex): 7 - Supraganglionic infarction (M4-M6 cortex): 3 Total score (0-10 with 10 being normal): 10 Review of the MIP images confirms the above findings CTA NECK FINDINGS Aortic arch: Visualized aortic arch of normal caliber with normal branch pattern. 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 No hemodynamically significant stenosis about the origin of the great vessels. Visualized subclavian arteries widely patent. Right carotid system: Right common carotid artery widely patent to the bifurcation. Minimal centric calcified plaque about the right bifurcation without significant stenosis. Right ICA widely patent distally to the skull base without stenosis, dissection, or occlusion. Left carotid system: Left common carotid artery patent from its origin to the bifurcation without stenosis. Mild scattered calcified plaque about the left bifurcation without hemodynamically significant stenosis. Left ICA patent to the skull base without stenosis, dissection, or occlusion. Vertebral arteries: Both of the vertebral arteries arise from the subclavian arteries. Right vertebral artery dominant. Focal plaque at the origin of the vertebral arteries bilaterally with moderate stenosis on the right and more severe stenosis on the left. Vertebral  arteries otherwise widely patent within the neck without stenosis, dissection, or occlusion. Skeleton: No acute osseous abnormality. No worrisome lytic or blastic osseous lesions. Endplate occipital assimilation noted. 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 Other neck: No acute soft tissue abnormality within the neck. Salivary glands normal. Thyroid normal. No adenopathy. Upper chest: Visualized upper chest demonstrates no acute abnormality. Irregular pleural thickening noted at the anterolateral right upper lobe. Partially visualized lungs are otherwise clear. Review of the MIP images confirms the above findings CTA HEAD FINDINGS Anterior circulation: Petrous segments widely patent bilaterally. Smooth calcified plaque throughout the cavernous ICAs with mild diffuse narrowing. Supraclinoid segments widely patent. ICA termini widely patent. A1 segments patent. Anterior communicating artery patent. Anterior cerebral arteries widely patent to their distal aspects without stenosis. No M1 occlusion or stenosis. No proximal M2 occlusion. Distal MCA branches well perfused and symmetric. Posterior circulation: Right vertebral artery dominant. Multifocal atheromatous plaque within the V4 segments bilaterally with moderate stenosis on the right and multifocal severe stenoses on the left. Posterior inferior cerebral arteries patent bilaterally. Basilar artery widely patent to its distal aspect. Superior cerebral arteries patent bilaterally. Both of the posterior cerebral arteries primarily supplied via the basilar and are well perfused to their distal aspects. Small bilateral posterior communicating arteries noted. Venous sinuses: Patent. Anatomic variants: None significant.  No aneurysm. Delayed phase: Not performed. Review of the MIP images confirms the above findings IMPRESSION: 1. Negative CTA for emergent large vessel occlusion. 2. Interval evolution of recently seen intraparenchymal hematoma positioned within  the left frontal lobe along the left DBS, with increased regional edema on today's exam without discernible acute blood products. Edema is largely vasogenic in appearance, and favored to be related to the recently identified evolving hematoma. Despite this, a superimposed small infarct in this region would be extremely difficult to exclude. ASPECTS = 10 3. Mild atherosclerotic change about the carotid bifurcations and carotid siphons without hemodynamically significant or correctable stenosis. 4. Atheromatous plaque at the origin of the vertebral arteries as well as the bilateral V4 segments with resultant moderate stenoses on the right and more severe stenosis on the left. Critical Value/emergent results were called by telephone at the time of interpretation on 05/15/2017 at 7:44 pm to Dr. Otelia Limes, who verbally acknowledged these results. Electronically Signed   By: Janell Quiet.D.  On: 05/15/2017 20:22     Myrene Buddy, MD 05/15/2017, 10:58 PM PGY-1, [facility] FPTS Intern pager: [phone], text pages welcome   I have separately seen and examined the patient. I have discussed the findings and exam with Dr. Primitivo Gauze and agree with the above note.  My changes/additions are outlined in BLUE.   Anders Simmonds, MD [facility], PGY-3

## 2017-05-15 NOTE — ED Provider Notes (Addendum)
[facility] EMERGENCY DEPARTMENT Provider Note   CSN: [id number] Arrival date & time: 05/15/17  1858     History   Chief Complaint Chief Complaint  Patient presents with  . Code Stroke    HPI Brendan [name] is a 77 y.o. male.  HPI Pt had a brain stimulator placed a couple of weeks of weeks ago.  There was some bleeding associated with the procedure.  Family states over the last 24 hours pt has had progressive weakness on the right side.  He actually was at Dr Rush Farmer office for staple removal but Dr [name] was in surgery.  Family thinks pt his arm is affected more than the leg.  With assistance and a walker he can walk a little bit.   Past Medical History:  Diagnosis Date  . Anxiety disorder   . Benign enlargement of prostate   . CAD in native artery 1997   Referred for CABG x3 in 2004 for LAD diagonal bifurcation lesion  . Cervical spinal stenosis   . Complex regional pain syndrome of right upper extremity    Right wrist; L arm  . Coronary atherosclerosis of artery bypass graft June 2013   Occluded SVG-D1; Cardiologist Dr. Herbie Baltimore  . Gait disorder   . Gastroesophageal reflux disease   . Glaucoma   . Hiatal hernia    GI: Dr Evette Cristal  . Hyperlipidemia LDL goal <70   . Hypertension   . Memory loss    Mild  . Obesity   . Peripheral neuropathy    possible peripheral neuropathy  . S/P CABG x 3 2004    LIMA-LAD, SVG to diagonal, SVG to OM  . Tremor, essential    On Primidone  . Unstable angina pectoris [facility])  June 2013   Cardiac cath: occluded SVG-DI. Patent LIMA-LAD and SVG-OM. EF 45% with apical inferior HK.    Patient Active Problem List   Diagnosis Date Noted  . Coronary artery disease involving native coronary artery of native heart without angina pectoris   . Glaucoma   . Anxiety state   . Cognitive disorder   . Urinary retention   . Traumatic hemorrhage of left cerebrum without loss of consciousness (HCC)   . Pressure injury of skin  05/05/2017  . Seborrheic dermatitis of scalp 10/05/2016  . Tremor, essential 10/03/2016  . Cough 06/27/2016  . Rhinitis, allergic 01/12/2014  . CHRONIC STABLE ANGINA 08/06/2013  . Obesity (BMI 30-39.9) 01/30/2013  . Lower extremity edema 01/30/2013  . S/P CABG x 3   . Atherosclerotic heart disease of native coronary artery with angina pectoris (HCC)   . Hyperlipidemia LDL goal <70   . Memory problem 07/01/2012  . Complex regional pain syndrome 05/24/2012  . Pleural plaque with presence of asbestos 05/15/2012  . Anemia, B12 deficiency 03/19/2012  . Atherosclerotic heart disease of artery bypass graft 09/16/2011  . BPH (benign prostatic hyperplasia) 08/08/2011  . Microscopic hematuria 02/27/2011  . Anxiety 10/03/2010  . Incontinence 07/21/2010  . ANGINA, STABLE 04/29/2008  . GERD 10/28/2007  . Essential tremor 08/30/2006  . HYPERTENSION, BENIGN 07/06/2006    Past Surgical History:  Procedure Laterality Date  . CARDIAC CATHETERIZATION    . CATARACT EXTRACTION, BILATERAL    . CORONARY ARTERY BYPASS GRAFT  06/2002   LIMA-LAD, SVG-D1, SVG-OM  . EYE SURGERY    . KNEE SURGERY Left   . LEFT HEART CATHETERIZATION WITH CORONARY/GRAFT ANGIOGRAM N/A 10/13/2011   Procedure: LEFT HEART CATHETERIZATION WITH CORONARY/GRAFT ANGIOGRAM;  Surgeon: Marykay Lexavid W Harding, MD;  [facility];  Service: Cardiovascular;  Laterality: N/A;  . MINOR PLACEMENT OF FIDUCIAL N/A 04/26/2017   Procedure: Fiducial placement;  Surgeon: Maeola HarmanStern, Joseph, MD;  [facility];  Service: Neurosurgery;  Laterality: N/A;  Fiducial placement  . NM MYOCAR PERF WALL MOTION  03/23/2009   protocol:Bruce, normal perfusion in all regions, post-stress EF 72%, exercise capacity 7METS, EKG negative for ischemia.  . Shoulder Orthoscopic Surgery   06/2003  . SUBTHALAMIC STIMULATOR INSERTION Bilateral 05/04/2017   Procedure: Bilateral Deep brain stimulator placement;  Surgeon: Maeola HarmanStern, Joseph, MD;  [facility];  Service:  Neurosurgery;  Laterality: Bilateral;  Bilateral deep brain stimulator placement  . TRANSTHORACIC ECHOCARDIOGRAM  April 2014   EF 55-60%; no regional wall motion abnormalities. Relatively normal study normal diastolic parameters/abnormal relaxation  . wrist ganglion cyst Left        Home Medications    Prior to Admission medications   Medication Sig Start Date End Date Taking? Authorizing Provider  atorvastatin (LIPITOR) 40 MG tablet Take 1 tablet (40 mg total) by mouth daily. 11/28/16  Yes Freddrick MarchAmin, [name], MD  fish oil-omega-3 fatty acids 1000 MG capsule Take 1 g by mouth 2 (two) times daily.   Yes [provider]  furosemide (LASIX) 20 MG tablet Take 1 tablet (20 mg total) by mouth daily. May take an  Extra dose if needed Patient taking differently: Take 20 mg by mouth daily.  09/28/16  Yes Marykay LexHarding, David W, MD  HYDROcodone-acetaminophen (NORCO/VICODIN) 5-325 MG tablet Take 1 tablet by mouth every 6 (six) hours as needed for moderate pain.   Yes [provider]  ketoconazole (NIZORAL) 2 % shampoo Apply 1 application topically 2 (two) times a week. Patient taking differently: Apply 1 application topically once a week.  10/05/16  Yes Bacigalupo, Marzella SchleinAngela M, MD  loratadine (CLARITIN) 10 MG tablet Take 1 tablet (10 mg total) by mouth daily. 06/27/16  Yes Leslye PeerByrum, Robert S, MD  losartan (COZAAR) 100 MG tablet Take 1 tablet (100 mg total) by mouth daily. 12/01/16  Yes Freddrick MarchAmin, [name], MD  nitroGLYCERIN (NITROSTAT) 0.4 MG SL tablet Place 1 tablet (0.4 mg total) under the tongue every 5 (five) minutes as needed for chest pain. call 911 if chest pain not better 05/01/16  Yes Bacigalupo, Marzella SchleinAngela M, MD  omeprazole (PRILOSEC) 20 MG capsule Take 1 capsule (20 mg total) by mouth daily as needed (acid reflux). 05/01/16  Yes Bacigalupo, Marzella SchleinAngela M, MD  propranolol (INDERAL) 80 MG tablet Take 1 tablet (80 mg total) by mouth 2 (two) times daily. 10/05/16  Yes Bacigalupo, Marzella SchleinAngela M, MD  Propylene Glycol  (SYSTANE BALANCE) 0.6 % SOLN Place 1 drop into both eyes daily as needed (for dry eyes).   Yes [provider]  sennosides-docusate sodium (SENOKOT-S) 8.6-50 MG tablet Take 1 tablet by mouth daily as needed for constipation.   Yes [provider]  gabapentin (NEURONTIN) 300 MG capsule Take 2 capsules (600 mg total) by mouth 3 (three) times daily. Patient taking differently: Take 600 mg by mouth at bedtime. x3 days. Then stop on 05/18/17. 07/25/16   Erasmo DownerBacigalupo, Angela M, MD  primidone (MYSOLINE) 250 MG tablet Take 250 mg by mouth 2 (two) times daily.    [provider]  primidone (MYSOLINE) 50 MG tablet 04/19/17: 250 mg am, 200 mg hs; 04/20/17: 200 mg am, 200 mg hs, 04/21/17: 200 mg am, 150 mg hs; 1/6-1/7: 150 mg BID; 1/8-1/9: 150 mg am, 100 mg  hs; 1/10-1/11: 100 mg BID; 1/12-1/13: 100 mg am, 50 mg hs; 1/14: 50 mg BID, 1/15: 50 mg QD, 1/16: Stop Patient not taking: Reported on 05/15/2017 04/19/17   Tat, Octaviano Batty, DO    Family History Family History  Problem Relation Age of Onset  . Heart disease Mother   . Heart disease Brother   . Tremor Paternal Uncle   . Heart disease Brother   . Drug abuse Daughter   . Hepatitis C Daughter     Social History Social History   Tobacco Use  . Smoking status: Former Smoker    Packs/day: 3.00    Years: 20.00    Pack years: 60.00    Types: Cigarettes    Last attempt to quit: 04/17/1968    Years since quitting: 49.1  . Smokeless tobacco: Never Used  Substance Use Topics  . Alcohol use: No    Frequency: Never    Comment: Occasional 2 mixed drinks or 3 beers.  . Drug use: No     Allergies   Lisinopril   Review of Systems Review of Systems  All other systems reviewed and are negative.    Physical Exam Updated Vital Signs BP (!) 147/65 (BP Location: Right Arm)   Pulse 85   Temp 99.3 F (37.4 C) (Oral)   Resp 20   Ht 1.676 m (5\' 6" )   Wt 92 kg (202 lb 13.2 oz)   SpO2 97%   BMI 32.74 kg/m   Physical Exam    Constitutional: No distress.  HENT:  Head: Normocephalic and atraumatic.  Right Ear: External ear normal.  Left Ear: External ear normal.  Healing craniotomy scars  Eyes: Conjunctivae are normal. Right eye exhibits no discharge. Left eye exhibits no discharge. No scleral icterus.  Neck: Neck supple. No tracheal deviation present.  Cardiovascular: Normal rate, regular rhythm and intact distal pulses.  Pulmonary/Chest: Effort normal and breath sounds normal. No stridor. No respiratory distress. He has no wheezes. He has no rales.  Abdominal: Soft. Bowel sounds are normal. He exhibits no distension. There is no tenderness. There is no rebound and no guarding.  Musculoskeletal: He exhibits no edema or tenderness.  Neurological: He is alert. He has normal strength. He is not disoriented. He displays tremor ( Of the left upper extremity). No cranial nerve deficit (no facial droop, extraocular movements intact, no slurred speech) or sensory deficit. He exhibits normal muscle tone. He displays no seizure activity. Coordination normal.  Weak grip strength on the right side compared to the left although patient is able to move his arms, patient is left to lift both legs off the bed but he has weakness in the right compared to the left  Skin: Skin is warm and dry. No rash noted.  Psychiatric: He has a normal mood and affect.  Nursing note and vitals reviewed.    ED Treatments / Results  Labs (all labs ordered are listed, but only abnormal results are displayed) Labs Reviewed  CBC - Abnormal; Notable for the following components:      Result Value   RBC 3.40 (*)    Hemoglobin 11.1 (*)    HCT 32.7 (*)    All other components within normal limits  COMPREHENSIVE METABOLIC PANEL - Abnormal; Notable for the following components:   Sodium 134 (*)    Chloride 97 (*)    Glucose, Bld 214 (*)    Creatinine, Ser 1.33 (*)    Calcium 8.7 (*)    Total Protein  6.3 (*)    Albumin 3.2 (*)    AST 42 (*)     GFR calc non Af Amer 51 (*)    GFR calc Af Amer 59 (*)    All other components within normal limits  I-STAT CHEM 8, ED - Abnormal; Notable for the following components:   Chloride 97 (*)    BUN 21 (*)    Glucose, Bld 207 (*)    Calcium, Ion 1.12 (*)    Hemoglobin 11.2 (*)    HCT 33.0 (*)    All other components within normal limits  CBG MONITORING, ED - Abnormal; Notable for the following components:   Glucose-Capillary 202 (*)    All other components within normal limits  ETHANOL  PROTIME-INR  APTT  DIFFERENTIAL  RAPID URINE DRUG SCREEN, HOSP PERFORMED  URINALYSIS, ROUTINE W REFLEX MICROSCOPIC  I-STAT TROPONIN, ED    EKG  EKG Interpretation  Date/Time:  Tuesday May 15 2017 19:44:42 EST Ventricular Rate:  87 PR Interval:    QRS Duration: 133 QT Interval:  384 QTC Calculation: 462 R Axis:   -113 Text Interpretation:  poor data quality ?sinus rhythm RBBB and LAFB Anteroseptal infarct, old Nonspecific T abnormalities, lateral leads Minimal ST elevation, lateral leads Poor data quality in current ECG precludes serial comparison Confirmed by Linwood Dibbles [phone]) on 05/15/2017 8:37:18 PM       Radiology Ct Angio Head W Or Wo Contrast  Result Date: 05/15/2017 CLINICAL DATA:  Code stroke. Initial evaluation for acute right-sided weakness, aphasia. EXAM: CT ANGIOGRAPHY HEAD AND NECK TECHNIQUE: Multidetector CT imaging of the head and neck was performed using the standard protocol during bolus administration of intravenous contrast. Multiplanar CT image reconstructions and MIPs were obtained to evaluate the vascular anatomy. Carotid stenosis measurements (when applicable) are obtained utilizing NASCET criteria, using the distal internal carotid diameter as the denominator. CONTRAST:  <See Chart> ISOVUE-370 IOPAMIDOL (ISOVUE-370) INJECTION 76% COMPARISON:  Prior CT from 05/06/2017. FINDINGS: CT HEAD FINDINGS Brain: Bilateral DBS implants in place, tips near the subthalamic nuclei.  There has been interval evolution of previously seen parenchymal hematoma along the left DBS, with increased edema now seen within this region, although no acute blood products are now identified (series 3, image 22). A superimposed infarct would be difficult to exclude. No other acute intracranial hemorrhage. No other acute large vessel territory infarct. No mass lesion or midline shift. No hydrocephalus. No discernible extra-axial fluid collection. Few scattered foci of postoperative pneumocephalus remain, decreased from previous. Vascular: No hyperdense vessel. Skull: Scalp soft tissues and calvarium demonstrate no acute abnormality. DBS implants noted. Sinuses: Globes and orbital soft tissues within normal limits. Patient status post lens extraction bilaterally. Paranasal sinuses and mastoid air cells are clear. Orbits: None. ASPECTS (Alberta Stroke Program Early CT Score) - Ganglionic level infarction (caudate, lentiform nuclei, internal capsule, insula, M1-M3 cortex): 7 - Supraganglionic infarction (M4-M6 cortex): 3 Total score (0-10 with 10 being normal): 10 Review of the MIP images confirms the above findings CTA NECK FINDINGS Aortic arch: Visualized aortic arch of normal caliber with normal branch pattern. 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 No hemodynamically significant stenosis about the origin of the great vessels. Visualized subclavian arteries widely patent. Right carotid system: Right common carotid artery widely patent to the bifurcation. Minimal centric calcified plaque about the right bifurcation without significant stenosis. Right ICA widely patent distally to the skull base without stenosis, dissection, or occlusion. Left carotid system: Left common carotid artery patent from its  origin to the bifurcation without stenosis. Mild scattered calcified plaque about the left bifurcation without hemodynamically significant stenosis. Left ICA patent to the skull base without  stenosis, dissection, or occlusion. Vertebral arteries: Both of the vertebral arteries arise from the subclavian arteries. Right vertebral artery dominant. Focal plaque at the origin of the vertebral arteries bilaterally with moderate stenosis on the right and more severe stenosis on the left. Vertebral arteries otherwise widely patent within the neck without stenosis, dissection, or occlusion. Skeleton: No acute osseous abnormality. No worrisome lytic or blastic osseous lesions. Endplate occipital assimilation noted. 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 Other neck: No acute soft tissue abnormality within the neck. Salivary glands normal. Thyroid normal. No adenopathy. Upper chest: Visualized upper chest demonstrates no acute abnormality. Irregular pleural thickening noted at the anterolateral right upper lobe. Partially visualized lungs are otherwise clear. Review of the MIP images confirms the above findings CTA HEAD FINDINGS Anterior circulation: Petrous segments widely patent bilaterally. Smooth calcified plaque throughout the cavernous ICAs with mild diffuse narrowing. Supraclinoid segments widely patent. ICA termini widely patent. A1 segments patent. Anterior communicating artery patent. Anterior cerebral arteries widely patent to their distal aspects without stenosis. No M1 occlusion or stenosis. No proximal M2 occlusion. Distal MCA branches well perfused and symmetric. Posterior circulation: Right vertebral artery dominant. Multifocal atheromatous plaque within the V4 segments bilaterally with moderate stenosis on the right and multifocal severe stenoses on the left. Posterior inferior cerebral arteries patent bilaterally. Basilar artery widely patent to its distal aspect. Superior cerebral arteries patent bilaterally. Both of the posterior cerebral arteries primarily supplied via the basilar and are well perfused to their distal aspects. Small bilateral posterior communicating arteries noted.  Venous sinuses: Patent. Anatomic variants: None significant.  No aneurysm. Delayed phase: Not performed. Review of the MIP images confirms the above findings IMPRESSION: 1. Negative CTA for emergent large vessel occlusion. 2. Interval evolution of recently seen intraparenchymal hematoma positioned within the left frontal lobe along the left DBS, with increased regional edema on today's exam without discernible acute blood products. Edema is largely vasogenic in appearance, and favored to be related to the recently identified evolving hematoma. Despite this, a superimposed small infarct in this region would be extremely difficult to exclude. ASPECTS = 10 3. Mild atherosclerotic change about the carotid bifurcations and carotid siphons without hemodynamically significant or correctable stenosis. 4. Atheromatous plaque at the origin of the vertebral arteries as well as the bilateral V4 segments with resultant moderate stenoses on the right and more severe stenosis on the left. Critical Value/emergent results were called by telephone at the time of interpretation on 05/15/2017 at 7:44 pm to Dr. Otelia Limes, who verbally acknowledged these results. Electronically Signed   By: Rise Mu M.D.   On: 05/15/2017 20:22   Ct Angio Neck W Or Wo Contrast  Result Date: 05/15/2017 CLINICAL DATA:  Code stroke. Initial evaluation for acute right-sided weakness, aphasia. EXAM: CT ANGIOGRAPHY HEAD AND NECK TECHNIQUE: Multidetector CT imaging of the head and neck was performed using the standard protocol during bolus administration of intravenous contrast. Multiplanar CT image reconstructions and MIPs were obtained to evaluate the vascular anatomy. Carotid stenosis measurements (when applicable) are obtained utilizing NASCET criteria, using the distal internal carotid diameter as the denominator. CONTRAST:  <See Chart> ISOVUE-370 IOPAMIDOL (ISOVUE-370) INJECTION 76% COMPARISON:  Prior CT from 05/06/2017. FINDINGS: CT HEAD  FINDINGS Brain: Bilateral DBS implants in place, tips near the subthalamic nuclei. There has been interval evolution of previously seen parenchymal hematoma  along the left DBS, with increased edema now seen within this region, although no acute blood products are now identified (series 3, image 22). A superimposed infarct would be difficult to exclude. No other acute intracranial hemorrhage. No other acute large vessel territory infarct. No mass lesion or midline shift. No hydrocephalus. No discernible extra-axial fluid collection. Few scattered foci of postoperative pneumocephalus remain, decreased from previous. Vascular: No hyperdense vessel. Skull: Scalp soft tissues and calvarium demonstrate no acute abnormality. DBS implants noted. Sinuses: Globes and orbital soft tissues within normal limits. Patient status post lens extraction bilaterally. Paranasal sinuses and mastoid air cells are clear. Orbits: None. ASPECTS (Alberta Stroke Program Early CT Score) - Ganglionic level infarction (caudate, lentiform nuclei, internal capsule, insula, M1-M3 cortex): 7 - Supraganglionic infarction (M4-M6 cortex): 3 Total score (0-10 with 10 being normal): 10 Review of the MIP images confirms the above findings CTA NECK FINDINGS Aortic arch: Visualized aortic arch of normal caliber with normal branch pattern. 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 No hemodynamically significant stenosis about the origin of the great vessels. Visualized subclavian arteries widely patent. Right carotid system: Right common carotid artery widely patent to the bifurcation. Minimal centric calcified plaque about the right bifurcation without significant stenosis. Right ICA widely patent distally to the skull base without stenosis, dissection, or occlusion. Left carotid system: Left common carotid artery patent from its origin to the bifurcation without stenosis. Mild scattered calcified plaque about the left bifurcation without  hemodynamically significant stenosis. Left ICA patent to the skull base without stenosis, dissection, or occlusion. Vertebral arteries: Both of the vertebral arteries arise from the subclavian arteries. Right vertebral artery dominant. Focal plaque at the origin of the vertebral arteries bilaterally with moderate stenosis on the right and more severe stenosis on the left. Vertebral arteries otherwise widely patent within the neck without stenosis, dissection, or occlusion. Skeleton: No acute osseous abnormality. No worrisome lytic or blastic osseous lesions. Endplate occipital assimilation noted. 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 Other neck: No acute soft tissue abnormality within the neck. Salivary glands normal. Thyroid normal. No adenopathy. Upper chest: Visualized upper chest demonstrates no acute abnormality. Irregular pleural thickening noted at the anterolateral right upper lobe. Partially visualized lungs are otherwise clear. Review of the MIP images confirms the above findings CTA HEAD FINDINGS Anterior circulation: Petrous segments widely patent bilaterally. Smooth calcified plaque throughout the cavernous ICAs with mild diffuse narrowing. Supraclinoid segments widely patent. ICA termini widely patent. A1 segments patent. Anterior communicating artery patent. Anterior cerebral arteries widely patent to their distal aspects without stenosis. No M1 occlusion or stenosis. No proximal M2 occlusion. Distal MCA branches well perfused and symmetric. Posterior circulation: Right vertebral artery dominant. Multifocal atheromatous plaque within the V4 segments bilaterally with moderate stenosis on the right and multifocal severe stenoses on the left. Posterior inferior cerebral arteries patent bilaterally. Basilar artery widely patent to its distal aspect. Superior cerebral arteries patent bilaterally. Both of the posterior cerebral arteries primarily supplied via the basilar and are well perfused  to their distal aspects. Small bilateral posterior communicating arteries noted. Venous sinuses: Patent. Anatomic variants: None significant.  No aneurysm. Delayed phase: Not performed. Review of the MIP images confirms the above findings IMPRESSION: 1. Negative CTA for emergent large vessel occlusion. 2. Interval evolution of recently seen intraparenchymal hematoma positioned within the left frontal lobe along the left DBS, with increased regional edema on today's exam without discernible acute blood products. Edema is largely vasogenic in appearance, and favored to be  related to the recently identified evolving hematoma. Despite this, a superimposed small infarct in this region would be extremely difficult to exclude. ASPECTS = 10 3. Mild atherosclerotic change about the carotid bifurcations and carotid siphons without hemodynamically significant or correctable stenosis. 4. Atheromatous plaque at the origin of the vertebral arteries as well as the bilateral V4 segments with resultant moderate stenoses on the right and more severe stenosis on the left. Critical Value/emergent results were called by telephone at the time of interpretation on 05/15/2017 at 7:44 pm to Dr. Otelia Limes, who verbally acknowledged these results. Electronically Signed   By: Rise Mu M.D.   On: 05/15/2017 20:22   Ct Head Code Stroke Wo Contrast  Result Date: 05/15/2017 CLINICAL DATA:  Code stroke. Initial evaluation for acute right-sided weakness, aphasia. EXAM: CT ANGIOGRAPHY HEAD AND NECK TECHNIQUE: Multidetector CT imaging of the head and neck was performed using the standard protocol during bolus administration of intravenous contrast. Multiplanar CT image reconstructions and MIPs were obtained to evaluate the vascular anatomy. Carotid stenosis measurements (when applicable) are obtained utilizing NASCET criteria, using the distal internal carotid diameter as the denominator. CONTRAST:  <See Chart> ISOVUE-370 IOPAMIDOL  (ISOVUE-370) INJECTION 76% COMPARISON:  Prior CT from 05/06/2017. FINDINGS: CT HEAD FINDINGS Brain: Bilateral DBS implants in place, tips near the subthalamic nuclei. There has been interval evolution of previously seen parenchymal hematoma along the left DBS, with increased edema now seen within this region, although no acute blood products are now identified (series 3, image 22). A superimposed infarct would be difficult to exclude. No other acute intracranial hemorrhage. No other acute large vessel territory infarct. No mass lesion or midline shift. No hydrocephalus. No discernible extra-axial fluid collection. Few scattered foci of postoperative pneumocephalus remain, decreased from previous. Vascular: No hyperdense vessel. Skull: Scalp soft tissues and calvarium demonstrate no acute abnormality. DBS implants noted. Sinuses: Globes and orbital soft tissues within normal limits. Patient status post lens extraction bilaterally. Paranasal sinuses and mastoid air cells are clear. Orbits: None. ASPECTS (Alberta Stroke Program Early CT Score) - Ganglionic level infarction (caudate, lentiform nuclei, internal capsule, insula, M1-M3 cortex): 7 - Supraganglionic infarction (M4-M6 cortex): 3 Total score (0-10 with 10 being normal): 10 Review of the MIP images confirms the above findings CTA NECK FINDINGS Aortic arch: Visualized aortic arch of normal caliber with normal branch pattern. 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 No hemodynamically significant stenosis about the origin of the great vessels. Visualized subclavian arteries widely patent. Right carotid system: Right common carotid artery widely patent to the bifurcation. Minimal centric calcified plaque about the right bifurcation without significant stenosis. Right ICA widely patent distally to the skull base without stenosis, dissection, or occlusion. Left carotid system: Left common carotid artery patent from its origin to the bifurcation  without stenosis. Mild scattered calcified plaque about the left bifurcation without hemodynamically significant stenosis. Left ICA patent to the skull base without stenosis, dissection, or occlusion. Vertebral arteries: Both of the vertebral arteries arise from the subclavian arteries. Right vertebral artery dominant. Focal plaque at the origin of the vertebral arteries bilaterally with moderate stenosis on the right and more severe stenosis on the left. Vertebral arteries otherwise widely patent within the neck without stenosis, dissection, or occlusion. Skeleton: No acute osseous abnormality. No worrisome lytic or blastic osseous lesions. Endplate occipital assimilation noted. 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 Other neck: No acute soft tissue abnormality within the neck. Salivary glands normal. Thyroid normal. No adenopathy. Upper chest: Visualized upper chest  demonstrates no acute abnormality. Irregular pleural thickening noted at the anterolateral right upper lobe. Partially visualized lungs are otherwise clear. Review of the MIP images confirms the above findings CTA HEAD FINDINGS Anterior circulation: Petrous segments widely patent bilaterally. Smooth calcified plaque throughout the cavernous ICAs with mild diffuse narrowing. Supraclinoid segments widely patent. ICA termini widely patent. A1 segments patent. Anterior communicating artery patent. Anterior cerebral arteries widely patent to their distal aspects without stenosis. No M1 occlusion or stenosis. No proximal M2 occlusion. Distal MCA branches well perfused and symmetric. Posterior circulation: Right vertebral artery dominant. Multifocal atheromatous plaque within the V4 segments bilaterally with moderate stenosis on the right and multifocal severe stenoses on the left. Posterior inferior cerebral arteries patent bilaterally. Basilar artery widely patent to its distal aspect. Superior cerebral arteries patent bilaterally. Both of the  posterior cerebral arteries primarily supplied via the basilar and are well perfused to their distal aspects. Small bilateral posterior communicating arteries noted. Venous sinuses: Patent. Anatomic variants: None significant.  No aneurysm. Delayed phase: Not performed. Review of the MIP images confirms the above findings IMPRESSION: 1. Negative CTA for emergent large vessel occlusion. 2. Interval evolution of recently seen intraparenchymal hematoma positioned within the left frontal lobe along the left DBS, with increased regional edema on today's exam without discernible acute blood products. Edema is largely vasogenic in appearance, and favored to be related to the recently identified evolving hematoma. Despite this, a superimposed small infarct in this region would be extremely difficult to exclude. ASPECTS = 10 3. Mild atherosclerotic change about the carotid bifurcations and carotid siphons without hemodynamically significant or correctable stenosis. 4. Atheromatous plaque at the origin of the vertebral arteries as well as the bilateral V4 segments with resultant moderate stenoses on the right and more severe stenosis on the left. Critical Value/emergent results were called by telephone at the time of interpretation on 05/15/2017 at 7:44 pm to Dr. Otelia Limes, who verbally acknowledged these results. Electronically Signed   By: Rise Mu M.D.   On: 05/15/2017 20:22    Procedures Procedures (including critical care time)  Medications Ordered in ED Medications  iopamidol (ISOVUE-370) 76 % injection (50 mLs  Contrast Given 05/15/17 1926)     Initial Impression / Assessment and Plan / ED Course  I have reviewed the triage vital signs and the nursing notes.  Pertinent labs & imaging results that were available during my care of the patient were reviewed by me and considered in my medical decision making (see chart for details).  Clinical Course as of May 16 2047  Tue May 15, 2017  2048 Labs  reviewed.  Hemoglobin is decreased from previous results but otherwise no clinically significant abnormalities  [JK]    Clinical Course User Index [JK] Linwood Dibbles, MD  Patient presented to the emergency room with concerns of a possible stroke in the setting of recent brain surgery.  Patient had a deep brain stimulator placed and this was complicated by hemorrhage.  Patient does have new findings neurologic exam.  he was evaluated by neurology by Dr. Otelia Limes.  Patient did have a CT scan as well as a CT angios to evaluate for large vessel occlusion.  Patient was evaluated and is not a TPA candidate or intravascular intervention candidate. Symptoms are most likely related to edema from his recent surgery and hemorrhage and not likely an acute stroke.   The plan is admission to the hospital.  Probable MRI in the morning.   PT OT consult.  Family  states that his stimulator is MRI compatible.  I will consult with the family is in service for admission.  Final Clinical Impressions(s) / ED Diagnoses   Final diagnoses:  Right sided weakness       Linwood Dibbles, MD 05/15/17 2049

## 2017-05-15 NOTE — Telephone Encounter (Signed)
Brendan CampanileSandy pt's daughter called and said pt had DBS surgery and there is a question concerning his gabapentin and primidone resumption CB# [phone]

## 2017-05-16 ENCOUNTER — Inpatient Hospital Stay (HOSPITAL_COMMUNITY): Payer: PPO

## 2017-05-16 ENCOUNTER — Telehealth: Payer: Self-pay | Admitting: Neurology

## 2017-05-16 DIAGNOSIS — G936 Cerebral edema: Secondary | ICD-10-CM

## 2017-05-16 DIAGNOSIS — G9050 Complex regional pain syndrome I, unspecified: Secondary | ICD-10-CM

## 2017-05-16 DIAGNOSIS — I361 Nonrheumatic tricuspid (valve) insufficiency: Secondary | ICD-10-CM

## 2017-05-16 DIAGNOSIS — G250 Essential tremor: Secondary | ICD-10-CM

## 2017-05-16 DIAGNOSIS — R531 Weakness: Secondary | ICD-10-CM

## 2017-05-16 DIAGNOSIS — R251 Tremor, unspecified: Secondary | ICD-10-CM

## 2017-05-16 DIAGNOSIS — E785 Hyperlipidemia, unspecified: Secondary | ICD-10-CM

## 2017-05-16 DIAGNOSIS — I619 Nontraumatic intracerebral hemorrhage, unspecified: Secondary | ICD-10-CM

## 2017-05-16 DIAGNOSIS — R739 Hyperglycemia, unspecified: Secondary | ICD-10-CM

## 2017-05-16 DIAGNOSIS — N179 Acute kidney failure, unspecified: Secondary | ICD-10-CM

## 2017-05-16 LAB — CBC
HCT: 30.1 % — ABNORMAL LOW (ref 39.0–52.0)
Hemoglobin: 10.3 g/dL — ABNORMAL LOW (ref 13.0–17.0)
MCH: 33.4 pg (ref 26.0–34.0)
MCHC: 34.2 g/dL (ref 30.0–36.0)
MCV: 97.7 fL (ref 78.0–100.0)
Platelets: 209 10*3/uL (ref 150–400)
RBC: 3.08 MIL/uL — ABNORMAL LOW (ref 4.22–5.81)
RDW: 13.7 % (ref 11.5–15.5)
WBC: 5.8 10*3/uL (ref 4.0–10.5)

## 2017-05-16 LAB — LIPID PANEL
Cholesterol: 115 mg/dL (ref 0–200)
HDL: 29 mg/dL — ABNORMAL LOW (ref >40)
LDL Cholesterol: 73 mg/dL (ref 0–99)
Total CHOL/HDL Ratio: 4 RATIO
Triglycerides: 64 mg/dL (ref <150)
VLDL: 13 mg/dL (ref 0–40)

## 2017-05-16 LAB — TROPONIN I
Troponin I: 0.04 ng/mL (ref <0.03)
Troponin I: 0.03 ng/mL (ref <0.03)
Troponin I: 0.03 ng/mL (ref <0.03)

## 2017-05-16 LAB — MRSA PCR SCREENING: MRSA by PCR: NEGATIVE

## 2017-05-16 LAB — HEMOGLOBIN A1C
Hgb A1c MFr Bld: 5.8 % — ABNORMAL HIGH (ref 4.8–5.6)
Mean Plasma Glucose: 119.76 mg/dL

## 2017-05-16 LAB — TSH: TSH: 7.022 u[IU]/mL — ABNORMAL HIGH (ref 0.350–4.500)

## 2017-05-16 MED ORDER — ENSURE ENLIVE PO LIQD
237.0000 mL | Freq: Two times a day (BID) | ORAL | Status: DC
  Administered 2017-05-16 – 2017-05-17 (×4): 237 mL via ORAL

## 2017-05-16 MED ORDER — ASPIRIN 81 MG PO CHEW
81.0000 mg | CHEWABLE_TABLET | Freq: Every day | ORAL | Status: DC
  Administered 2017-05-16 – 2017-05-18 (×3): 81 mg via ORAL
  Filled 2017-05-16 (×3): qty 1

## 2017-05-16 NOTE — Telephone Encounter (Signed)
Tat patient. 

## 2017-05-16 NOTE — Evaluation (Signed)
Physical Therapy Evaluation Patient Details Name: Brendan [name] MRN: [id number] DOB: Aug 13, 1940 Today's Date: 05/16/2017   History of Present Illness  77yo male with 12-24 hours of R sided weakness, R UE much weaker than R LE. of note, he had a deep brain stimulator placed on 05/04/17 and had been receiving therapy at a rehabilitation facility PTA. CT negative for large bleed, per imaging notes all appears normal following deep brain stimulator placement. PMH anxiety, cervical stenosis, CPRS L UE, hx CABG x3, gait disorder, glaucoma, obesity, peripheral neuropathy, hx heart cath, hx knee surgery, deep brain stimulator placed 05/04/17.   Clinical Impression   Patient received in bed with daughter present, pleasant and willing to participate with skilled PT services this morning. He is able to perform functional bed mobility with ModA, and requires MinA for functional transfers however appears possibly apraxic and weak especially with R UE. He was noted to have had a BM and was able to stand for approximately 5-10 minutes with RW to allow PT to perform skin care but was fatigued following this activity. Able to gait train in room to the chair with MinA for safe use and navigation of RW in room, cues for safety and sequencing, noting significant gait deviation including shuffling pattern and flexed trunk, unsafe use of RW without cues from PT. He was left up in the chair with chair alarm set, all needs met this morning. He will continue to benefit from ongoing skilled PT services during this course of hospitalization, and appears appropriate for return to his rehabilitation facility for further skilled therapy moving forward.     Follow Up Recommendations Other (comment)(appropriate for return to rehab facility )    Equipment Recommendations  None recommended by PT(defer to next venue )    Recommendations for Other Services       Precautions / Restrictions Precautions Precautions:  Fall Restrictions Weight Bearing Restrictions: No      Mobility  Bed Mobility Overal bed mobility: Needs Assistance Bed Mobility: Supine to Sit     Supine to sit: Mod assist     General bed mobility comments: needs help powering up trunk/pivoting to EOB safely due to sequencing difficulty   Transfers Overall transfer level: Needs assistance Equipment used: Rolling walker (2 wheeled) Transfers: Sit to/from Stand Sit to Stand: Min assist         General transfer comment: VC/TC for technique   Ambulation/Gait Ambulation/Gait assistance: Min assist Ambulation Distance (Feet): 20 Feet Assistive device: Rolling walker (2 wheeled) Gait Pattern/deviations: Step-through pattern;Decreased step length - right;Decreased step length - left;Decreased dorsiflexion - right;Decreased dorsiflexion - left;Shuffle;Trunk flexed     General Gait Details: MinA to navigate with walker especially on turns, VC for navigation in room   Stairs            Wheelchair Mobility    Modified Rankin (Stroke Patients Only)       Balance Overall balance assessment: Needs assistance Sitting-balance support: Feet supported;Bilateral upper extremity supported Sitting balance-Leahy Scale: Good     Standing balance support: Bilateral upper extremity supported;During functional activity Standing balance-Leahy Scale: Fair Standing balance comment: reliant on UE support on walker                              Pertinent Vitals/Pain Pain Assessment: No/denies pain Pain Score: 0-No pain Faces Pain Scale: No hurt Pain Intervention(s): Monitored during session;Limited activity within patient's tolerance    Home  Living Family/patient expects to be discharged to:: Private residence Living Arrangements: Alone Available Help at Discharge: Family;Available 24 hours/day Type of Home: House Home Access: Stairs to enter Entrance Stairs-Rails: Can reach both Entrance Stairs-Number of  Steps: 4 Home Layout: One level Home Equipment: Walker - 2 wheels;Cane - single point      Prior Function Level of Independence: Independent   Gait / Transfers Assistance Needed: daughter reports he was very independent PTA   ADL's / Homemaking Assistance Needed: Assist with self-feeding and ADL due to tremors.         Hand Dominance   Dominant Hand: Left    Extremity/Trunk Assessment   Upper Extremity Assessment Upper Extremity Assessment: Defer to OT evaluation    Lower Extremity Assessment Lower Extremity Assessment: Generalized weakness    Cervical / Trunk Assessment Cervical / Trunk Assessment: Kyphotic  Communication   Communication: Receptive difficulties;Expressive difficulties  Cognition Arousal/Alertness: Awake/alert Behavior During Therapy: WFL for tasks assessed/performed;Flat affect Overall Cognitive Status: Impaired/Different from baseline Area of Impairment: Safety/judgement;Problem solving                     Memory: Decreased recall of precautions;Decreased short-term memory Following Commands: Follows one step commands consistently;Follows one step commands with increased time     Problem Solving: Slow processing;Decreased initiation;Difficulty sequencing;Requires verbal cues;Requires tactile cues        General Comments      Exercises     Assessment/Plan    PT Assessment Patient needs continued PT services  PT Problem List Decreased strength;Decreased activity tolerance;Decreased range of motion;Decreased balance;Decreased mobility;Decreased coordination;Decreased cognition;Decreased knowledge of use of DME;Decreased safety awareness;Decreased knowledge of precautions       PT Treatment Interventions DME instruction;Gait training;Stair training;Functional mobility training;Therapeutic activities;Therapeutic exercise;Balance training;Neuromuscular re-education;Cognitive remediation;Patient/family education    PT Goals (Current  goals can be found in the Care Plan section)  Acute Rehab PT Goals Patient Stated Goal: to get better  PT Goal Formulation: With patient Time For Goal Achievement: 05/23/17 Potential to Achieve Goals: Good    Frequency Min 3X/week   Barriers to discharge        Co-evaluation               AM-PAC PT "6 Clicks" Daily Activity  Outcome Measure Difficulty turning over in bed (including adjusting bedclothes, sheets and blankets)?: Unable Difficulty moving from lying on back to sitting on the side of the bed? : Unable Difficulty sitting down on and standing up from a chair with arms (e.g., wheelchair, bedside commode, etc,.)?: Unable Help needed moving to and from a bed to chair (including a wheelchair)?: A Little Help needed walking in hospital room?: A Little Help needed climbing 3-5 steps with a railing? : A Lot 6 Click Score: 11    End of Session Equipment Utilized During Treatment: Gait belt Activity Tolerance: Patient tolerated treatment well Patient left: in chair;with chair alarm set;with call bell/phone within reach   PT Visit Diagnosis: Unsteadiness on feet (R26.81);Other abnormalities of gait and mobility (R26.89);Apraxia (R48.2);Other symptoms and signs involving the nervous system (R29.898);Muscle weakness (generalized) (M62.81)    Time: 7493-5521 PT Time Calculation (min) (ACUTE ONLY): 30 min   Charges:   PT Evaluation $PT Eval Moderate Complexity: 1 Mod PT Treatments $Gait Training: 8-22 mins   PT G Codes:        Deniece Ree PT, DPT, CBIS  Supplemental Physical Therapist [place]   Pager [phone]

## 2017-05-16 NOTE — Progress Notes (Signed)
  Echocardiogram 2D Echocardiogram has been performed.  Owens Hara T Hailey Miles 05/16/2017, 4:42 PM

## 2017-05-16 NOTE — Telephone Encounter (Signed)
Jade, Dr. Venetia MaxonStern just contacted me to state that daughter contacted him with regular jerking episodes of right arm and need for possible EEG which I agree with.  Review of records indicates patient currently in the hospital.  I will see if neurohospitalists can see him since they cover me in hospital and will order EEG.  LM for Dr. Venetia MaxonStern re: review of CT brain (he is in sx).  Has large area of edema.  ? Should consideration be made to removing that left sided lead.

## 2017-05-16 NOTE — Progress Notes (Signed)
Subjective: Patient reports "Hey! I know you!"  Objective: Vital signs in last 24 hours: Temp:  [98.2 F (36.8 C)-99.4 F (37.4 C)] 98.2 F (36.8 C) (01/30 1345) Pulse Rate:  [63-99] 63 (01/30 1345) Resp:  [13-20] 18 (01/30 1345) BP: (110-153)/(50-84) 136/50 (01/30 1345) SpO2:  [95 %-98 %] 97 % (01/30 1345) Weight:  [88.9 kg (195 lb 15.8 oz)-92 kg (202 lb 13.2 oz)] 88.9 kg (195 lb 15.8 oz) (01/30 0201)  Intake/Output from previous day: 01/29 0701 - 01/30 0700 In: 480 [I.V.:480] Out: 200 [Urine:200] Intake/Output this shift: No intake/output data recorded.  Sitting in chair, awake and alert repoting no pain or discomfort. Pt responds to my questions readily, smiling; follows (attempts) commands. Unable to lift RUE against gravity(new since discharge to [facility]). Unable to spread fingers right hand, weak grip right hand(new since discharge to SNF). Slight  right facial droop (new since discharge to SNF). Now with condom cath since lat night d/t urge incontinence. Few episodes of fecal incontinence last two days(new since discharge to SNF).    Lab Results: Recent Labs    05/15/17 1850 05/15/17 1904 05/16/17 0423  WBC 8.7  --  5.8  HGB 11.1* 11.2* 10.3*  HCT 32.7* 33.0* 30.1*  PLT 246  --  209   BMET Recent Labs    05/15/17 1850 05/15/17 1904  NA 134* 136  K 3.6 3.6  CL 97* 97*  CO2 24  --   GLUCOSE 214* 207*  BUN 20 21*  CREATININE 1.33* 1.20  CALCIUM 8.7*  --     Studies/Results: Ct Angio Head W Or Wo Contrast  Result Date: 05/15/2017 CLINICAL DATA:  Code stroke. Initial evaluation for acute right-sided weakness, aphasia. EXAM: CT ANGIOGRAPHY HEAD AND NECK TECHNIQUE: Multidetector CT imaging of the head and neck was performed using the standard protocol during bolus administration of intravenous contrast. Multiplanar CT image reconstructions and MIPs were obtained to evaluate the vascular anatomy. Carotid stenosis measurements (when applicable) are obtained utilizing  NASCET criteria, using the distal internal carotid diameter as the denominator. CONTRAST:  <See Chart> ISOVUE-370 IOPAMIDOL (ISOVUE-370) INJECTION 76% COMPARISON:  Prior CT from 05/06/2017. FINDINGS: CT HEAD FINDINGS Brain: Bilateral DBS implants in place, tips near the subthalamic nuclei. There has been interval evolution of previously seen parenchymal hematoma along the left DBS, with increased edema now seen within this region, although no acute blood products are now identified (series 3, image 22). A superimposed infarct would be difficult to exclude. No other acute intracranial hemorrhage. No other acute large vessel territory infarct. No mass lesion or midline shift. No hydrocephalus. No discernible extra-axial fluid collection. Few scattered foci of postoperative pneumocephalus remain, decreased from previous. Vascular: No hyperdense vessel. Skull: Scalp soft tissues and calvarium demonstrate no acute abnormality. DBS implants noted. Sinuses: Globes and orbital soft tissues within normal limits. Patient status post lens extraction bilaterally. Paranasal sinuses and mastoid air cells are clear. Orbits: None. ASPECTS ([place]berta Stroke Program Early CT Score) - Ganglionic level infarction (caudate, lentiform nuclei, internal capsule, insula, M1-M3 cortex): 7 - Supraganglionic infarction (M4-M6 cortex): 3 Total score (0-10 with 10 being normal): 10 Review of the MIP images confirms the above findings CTA NECK FINDINGS Aortic arch: Visualized aortic arch of normal caliber with normal branch pattern. 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 No hemodynamically significant stenosis about the origin of the great vessels. Visualized subclavian arteries widely patent. Right carotid system: Right common carotid artery widely patent to the bifurcation. Minimal centric calcified plaque  about the right bifurcation without significant stenosis. Right ICA widely patent distally to the skull base without  stenosis, dissection, or occlusion. Left carotid system: Left common carotid artery patent from its origin to the bifurcation without stenosis. Mild scattered calcified plaque about the left bifurcation without hemodynamically significant stenosis. Left ICA patent to the skull base without stenosis, dissection, or occlusion. Vertebral arteries: Both of the vertebral arteries arise from the subclavian arteries. Right vertebral artery dominant. Focal plaque at the origin of the vertebral arteries bilaterally with moderate stenosis on the right and more severe stenosis on the left. Vertebral arteries otherwise widely patent within the neck without stenosis, dissection, or occlusion. Skeleton: No acute osseous abnormality. No worrisome lytic or blastic osseous lesions. Endplate occipital assimilation noted. 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 Other neck: No acute soft tissue abnormality within the neck. Salivary glands normal. Thyroid normal. No adenopathy. Upper chest: Visualized upper chest demonstrates no acute abnormality. Irregular pleural thickening noted at the anterolateral right upper lobe. Partially visualized lungs are otherwise clear. Review of the MIP images confirms the above findings CTA HEAD FINDINGS Anterior circulation: Petrous segments widely patent bilaterally. Smooth calcified plaque throughout the cavernous ICAs with mild diffuse narrowing. Supraclinoid segments widely patent. ICA termini widely patent. A1 segments patent. Anterior communicating artery patent. Anterior cerebral arteries widely patent to their distal aspects without stenosis. No M1 occlusion or stenosis. No proximal M2 occlusion. Distal MCA branches well perfused and symmetric. Posterior circulation: Right vertebral artery dominant. Multifocal atheromatous plaque within the V4 segments bilaterally with moderate stenosis on the right and multifocal severe stenoses on the left. Posterior inferior cerebral arteries  patent bilaterally. Basilar artery widely patent to its distal aspect. Superior cerebral arteries patent bilaterally. Both of the posterior cerebral arteries primarily supplied via the basilar and are well perfused to their distal aspects. Small bilateral posterior communicating arteries noted. Venous sinuses: Patent. Anatomic variants: None significant.  No aneurysm. Delayed phase: Not performed. Review of the MIP images confirms the above findings IMPRESSION: 1. Negative CTA for emergent large vessel occlusion. 2. Interval evolution of recently seen intraparenchymal hematoma positioned within the left frontal lobe along the left DBS, with increased regional edema on today's exam without discernible acute blood products. Edema is largely vasogenic in appearance, and favored to be related to the recently identified evolving hematoma. Despite this, a superimposed small infarct in this region would be extremely difficult to exclude. ASPECTS = 10 3. Mild atherosclerotic change about the carotid bifurcations and carotid siphons without hemodynamically significant or correctable stenosis. 4. Atheromatous plaque at the origin of the vertebral arteries as well as the bilateral V4 segments with resultant moderate stenoses on the right and more severe stenosis on the left. Critical Value/emergent results were called by telephone at the time of interpretation on 05/15/2017 at 7:44 pm to Dr. Otelia Limes, who verbally acknowledged these results. Electronically Signed   By: Rise Mu M.D.   On: 05/15/2017 20:22   Ct Angio Neck W Or Wo Contrast  Result Date: 05/15/2017 CLINICAL DATA:  Code stroke. Initial evaluation for acute right-sided weakness, aphasia. EXAM: CT ANGIOGRAPHY HEAD AND NECK TECHNIQUE: Multidetector CT imaging of the head and neck was performed using the standard protocol during bolus administration of intravenous contrast. Multiplanar CT image reconstructions and MIPs were obtained to evaluate the  vascular anatomy. Carotid stenosis measurements (when applicable) are obtained utilizing NASCET criteria, using the distal internal carotid diameter as the denominator. CONTRAST:  <See Chart> ISOVUE-370 IOPAMIDOL (ISOVUE-370) INJECTION 76%  COMPARISON:  Prior CT from 05/06/2017. FINDINGS: CT HEAD FINDINGS Brain: Bilateral DBS implants in place, tips near the subthalamic nuclei. There has been interval evolution of previously seen parenchymal hematoma along the left DBS, with increased edema now seen within this region, although no acute blood products are now identified (series 3, image 22). A superimposed infarct would be difficult to exclude. No other acute intracranial hemorrhage. No other acute large vessel territory infarct. No mass lesion or midline shift. No hydrocephalus. No discernible extra-axial fluid collection. Few scattered foci of postoperative pneumocephalus remain, decreased from previous. Vascular: No hyperdense vessel. Skull: Scalp soft tissues and calvarium demonstrate no acute abnormality. DBS implants noted. Sinuses: Globes and orbital soft tissues within normal limits. Patient status post lens extraction bilaterally. Paranasal sinuses and mastoid air cells are clear. Orbits: None. ASPECTS (Alberta Stroke Program Early CT Score) - Ganglionic level infarction (caudate, lentiform nuclei, internal capsule, insula, M1-M3 cortex): 7 - Supraganglionic infarction (M4-M6 cortex): 3 Total score (0-10 with 10 being normal): 10 Review of the MIP images confirms the above findings CTA NECK FINDINGS Aortic arch: Visualized aortic arch of normal caliber with normal branch pattern. 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 No hemodynamically significant stenosis about the origin of the great vessels. Visualized subclavian arteries widely patent. Right carotid system: Right common carotid artery widely patent to the bifurcation. Minimal centric calcified plaque about the right bifurcation  without significant stenosis. Right ICA widely patent distally to the skull base without stenosis, dissection, or occlusion. Left carotid system: Left common carotid artery patent from its origin to the bifurcation without stenosis. Mild scattered calcified plaque about the left bifurcation without hemodynamically significant stenosis. Left ICA patent to the skull base without stenosis, dissection, or occlusion. Vertebral arteries: Both of the vertebral arteries arise from the subclavian arteries. Right vertebral artery dominant. Focal plaque at the origin of the vertebral arteries bilaterally with moderate stenosis on the right and more severe stenosis on the left. Vertebral arteries otherwise widely patent within the neck without stenosis, dissection, or occlusion. Skeleton: No acute osseous abnormality. No worrisome lytic or blastic osseous lesions. Endplate occipital assimilation noted. 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 Other neck: No acute soft tissue abnormality within the neck. Salivary glands normal. Thyroid normal. No adenopathy. Upper chest: Visualized upper chest demonstrates no acute abnormality. Irregular pleural thickening noted at the anterolateral right upper lobe. Partially visualized lungs are otherwise clear. Review of the MIP images confirms the above findings CTA HEAD FINDINGS Anterior circulation: Petrous segments widely patent bilaterally. Smooth calcified plaque throughout the cavernous ICAs with mild diffuse narrowing. Supraclinoid segments widely patent. ICA termini widely patent. A1 segments patent. Anterior communicating artery patent. Anterior cerebral arteries widely patent to their distal aspects without stenosis. No M1 occlusion or stenosis. No proximal M2 occlusion. Distal MCA branches well perfused and symmetric. Posterior circulation: Right vertebral artery dominant. Multifocal atheromatous plaque within the V4 segments bilaterally with moderate stenosis on the  right and multifocal severe stenoses on the left. Posterior inferior cerebral arteries patent bilaterally. Basilar artery widely patent to its distal aspect. Superior cerebral arteries patent bilaterally. Both of the posterior cerebral arteries primarily supplied via the basilar and are well perfused to their distal aspects. Small bilateral posterior communicating arteries noted. Venous sinuses: Patent. Anatomic variants: None significant.  No aneurysm. Delayed phase: Not performed. Review of the MIP images confirms the above findings IMPRESSION: 1. Negative CTA for emergent large vessel occlusion. 2. Interval evolution of recently seen intraparenchymal hematoma positioned  within the left frontal lobe along the left DBS, with increased regional edema on today's exam without discernible acute blood products. Edema is largely vasogenic in appearance, and favored to be related to the recently identified evolving hematoma. Despite this, a superimposed small infarct in this region would be extremely difficult to exclude. ASPECTS = 10 3. Mild atherosclerotic change about the carotid bifurcations and carotid siphons without hemodynamically significant or correctable stenosis. 4. Atheromatous plaque at the origin of the vertebral arteries as well as the bilateral V4 segments with resultant moderate stenoses on the right and more severe stenosis on the left. Critical Value/emergent results were called by telephone at the time of interpretation on 05/15/2017 at 7:44 pm to Dr. Otelia Limes, who verbally acknowledged these results. Electronically Signed   By: Rise Mu M.D.   On: 05/15/2017 20:22   Ct Head Code Stroke Wo Contrast  Result Date: 05/15/2017 CLINICAL DATA:  Code stroke. Initial evaluation for acute right-sided weakness, aphasia. EXAM: CT ANGIOGRAPHY HEAD AND NECK TECHNIQUE: Multidetector CT imaging of the head and neck was performed using the standard protocol during bolus administration of intravenous  contrast. Multiplanar CT image reconstructions and MIPs were obtained to evaluate the vascular anatomy. Carotid stenosis measurements (when applicable) are obtained utilizing NASCET criteria, using the distal internal carotid diameter as the denominator. CONTRAST:  <See Chart> ISOVUE-370 IOPAMIDOL (ISOVUE-370) INJECTION 76% COMPARISON:  Prior CT from 05/06/2017. FINDINGS: CT HEAD FINDINGS Brain: Bilateral DBS implants in place, tips near the subthalamic nuclei. There has been interval evolution of previously seen parenchymal hematoma along the left DBS, with increased edema now seen within this region, although no acute blood products are now identified (series 3, image 22). A superimposed infarct would be difficult to exclude. No other acute intracranial hemorrhage. No other acute large vessel territory infarct. No mass lesion or midline shift. No hydrocephalus. No discernible extra-axial fluid collection. Few scattered foci of postoperative pneumocephalus remain, decreased from previous. Vascular: No hyperdense vessel. Skull: Scalp soft tissues and calvarium demonstrate no acute abnormality. DBS implants noted. Sinuses: Globes and orbital soft tissues within normal limits. Patient status post lens extraction bilaterally. Paranasal sinuses and mastoid air cells are clear. Orbits: None. ASPECTS (Alber[place] Stroke Program Early CT Score) - Ganglionic level infarction (caudate, lentiform nuclei, internal capsule, insula, M1-M3 cortex): 7 - Supraganglionic infarction (M4-M6 cortex): 3 Total score (0-10 with 10 being normal): 10 Review of the MIP images confirms the above findings CTA NECK FINDINGS Aortic arch: Visualized aortic arch of normal caliber with normal branch pattern. 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 No hemodynamically significant stenosis about the origin of the great vessels. Visualized subclavian arteries widely patent. Right carotid system: Right common carotid artery widely patent  to the bifurcation. Minimal centric calcified plaque about the right bifurcation without significant stenosis. Right ICA widely patent distally to the skull base without stenosis, dissection, or occlusion. Left carotid system: Left common carotid artery patent from its origin to the bifurcation without stenosis. Mild scattered calcified plaque about the left bifurcation without hemodynamically significant stenosis. Left ICA patent to the skull base without stenosis, dissection, or occlusion. Vertebral arteries: Both of the vertebral arteries arise from the subclavian arteries. Right vertebral artery dominant. Focal plaque at the origin of the vertebral arteries bilaterally with moderate stenosis on the right and more severe stenosis on the left. Vertebral arteries otherwise widely patent within the neck without stenosis, dissection, or occlusion. Skeleton: No acute osseous abnormality. No worrisome lytic or blastic osseous lesions. Endplate  occipital assimilation noted. 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 Other neck: No acute soft tissue abnormality within the neck. Salivary glands normal. Thyroid normal. No adenopathy. Upper chest: Visualized upper chest demonstrates no acute abnormality. Irregular pleural thickening noted at the anterolateral right upper lobe. Partially visualized lungs are otherwise clear. Review of the MIP images confirms the above findings CTA HEAD FINDINGS Anterior circulation: Petrous segments widely patent bilaterally. Smooth calcified plaque throughout the cavernous ICAs with mild diffuse narrowing. Supraclinoid segments widely patent. ICA termini widely patent. A1 segments patent. Anterior communicating artery patent. Anterior cerebral arteries widely patent to their distal aspects without stenosis. No M1 occlusion or stenosis. No proximal M2 occlusion. Distal MCA branches well perfused and symmetric. Posterior circulation: Right vertebral artery dominant. Multifocal  atheromatous plaque within the V4 segments bilaterally with moderate stenosis on the right and multifocal severe stenoses on the left. Posterior inferior cerebral arteries patent bilaterally. Basilar artery widely patent to its distal aspect. Superior cerebral arteries patent bilaterally. Both of the posterior cerebral arteries primarily supplied via the basilar and are well perfused to their distal aspects. Small bilateral posterior communicating arteries noted. Venous sinuses: Patent. Anatomic variants: None significant.  No aneurysm. Delayed phase: Not performed. Review of the MIP images confirms the above findings IMPRESSION: 1. Negative CTA for emergent large vessel occlusion. 2. Interval evolution of recently seen intraparenchymal hematoma positioned within the left frontal lobe along the left DBS, with increased regional edema on today's exam without discernible acute blood products. Edema is largely vasogenic in appearance, and favored to be related to the recently identified evolving hematoma. Despite this, a superimposed small infarct in this region would be extremely difficult to exclude. ASPECTS = 10 3. Mild atherosclerotic change about the carotid bifurcations and carotid siphons without hemodynamically significant or correctable stenosis. 4. Atheromatous plaque at the origin of the vertebral arteries as well as the bilateral V4 segments with resultant moderate stenoses on the right and more severe stenosis on the left. Critical Value/emergent results were called by telephone at the time of interpretation on 05/15/2017 at 7:44 pm to Dr. Otelia Limes, who verbally acknowledged these results. Electronically Signed   By: Rise Mu M.D.   On: 05/15/2017 20:22    Assessment/Plan:   LOS: 1 day  EEG planned. Continue PT/OT/ ST.   Georgiann Cocker 05/16/2017, 2:33 PM

## 2017-05-16 NOTE — Progress Notes (Signed)
OT Cancellation Note  Patient Details Name: Brendan [name] MRN: [id number] DOB: 12/23/1940   Cancelled Treatment:    Reason Eval/Treat Not Completed: Fatigue/lethargy limiting ability to participate. Pt sleeping heavily and per daughter he has not slept well since coming from ER this am and she requests OT return another time  [name], [name] 05/16/2017, 1:12 PM

## 2017-05-16 NOTE — Progress Notes (Addendum)
NEUROHOSPITALISTS STROKE TEAM - DAILY PROGRESS NOTE   ADMISSION HISTORY: Brendan [name] is an 77 y.o. male presenting via EMS after staff at his rehab facility noted him to be weak on the right with right facial droop. He recently has had a neurosurgical procedure for placement of DBS electrodes (subthalamic stimulators) - uncertain the indication at this time, but he has a history of essential tremor, with no diagnosis of Parkinson's disease listed in Epic. Per report, the batteries for the DB stimulators have not yet been installed. Staples from the recent surgeries are still in place along the frontoparietal scalp bilaterally.   LSN: [id number] on Monday tPA Given: No: Out of time window  SUBJECTIVE (INTERVAL HISTORY) Daughter is at the bedside. Patient is found laying in bed in NAD. Overall he feels his condition is unchanged. Voices no new complaints. No new events reported overnight.   OBJECTIVE Lab Results: CBC:  Recent Labs  Lab 05/15/17 1850 05/15/17 1904 05/16/17 0423  WBC 8.7  --  5.8  HGB 11.1* 11.2* 10.3*  HCT 32.7* 33.0* 30.1*  MCV 96.2  --  97.7  PLT 246  --  209   BMP: Recent Labs  Lab 05/15/17 1850 05/15/17 1904  NA 134* 136  K 3.6 3.6  CL 97* 97*  CO2 24  --   GLUCOSE 214* 207*  BUN 20 21*  CREATININE 1.33* 1.20  CALCIUM 8.7*  --    Liver Function Tests:  Recent Labs  Lab 05/15/17 1850  AST 42*  ALT 43  ALKPHOS 80  BILITOT 0.5  PROT 6.3*  ALBUMIN 3.2*   Thyroid Function Studies:  Recent Labs    05/16/17 0713  TSH 7.022*   Cardiac Enzymes:  Recent Labs  Lab 05/16/17 0713 05/16/17 1230  TROPONINI 0.04* 0.03*   Coagulation Studies:  Recent Labs    05/15/17 1850  APTT 31  INR 1.05   Urinalysis:  Recent Labs  Lab 05/15/17 2023  COLORURINE YELLOW  APPEARANCEUR CLEAR  LABSPEC 1.013  PHURINE 5.0  GLUCOSEU 50*  HGBUR SMALL*  BILIRUBINUR NEGATIVE  KETONESUR NEGATIVE  PROTEINUR  NEGATIVE  NITRITE NEGATIVE  LEUKOCYTESUR NEGATIVE   Urine Drug Screen:     Component Value Date/Time   LABOPIA NONE DETECTED 05/15/2017 2023   COCAINSCRNUR NONE DETECTED 05/15/2017 2023   LABBENZ NONE DETECTED 05/15/2017 2023   AMPHETMU NONE DETECTED 05/15/2017 2023   THCU NONE DETECTED 05/15/2017 2023   LABBARB POSITIVE (A) 05/15/2017 2023    Alcohol Level:  Recent Labs  Lab 05/15/17 1849  ETH <10   PHYSICAL EXAM Temp:  [98.2 F (36.8 C)-99.4 F (37.4 C)] 98.2 F (36.8 C) (01/30 1345) Pulse Rate:  [63-99] 63 (01/30 1345) Resp:  [13-20] 18 (01/30 1345) BP: (110-153)/(50-84) 136/50 (01/30 1345) SpO2:  [95 %-98 %] 97 % (01/30 1345) Weight:  [88.9 kg (195 lb 15.8 oz)-92 kg (202 lb 13.2 oz)] 88.9 kg (195 lb 15.8 oz) (01/30 0201) General - Well nourished, well developed, in no apparent distress HEENT: Surgical staples to frontoparietal scalp noted bilaterally Lungs: Respirations unlabored Ext: Warm and well perfused  Neurologic Examination: Mental Status: Awake and alert. Expressive > receptive dysphasia noted. Has difficulty naming common objects, pausing and appearing to be frustrated as he attempts to recall words. Some speech output is fluent and at other times speech is halting. Has difficulty following some commands and answering questions, often requiring repetition of the command/question by examiner.  Cranial Nerves: II:  Visual fields grossly  intact to threat bilateraly; does not demonstrate understanding of more complex commands for visual field testing. Pupils equal and round.  III,IV, VI: EOMI full to left and right with saccadic pursuits noted. No nystagmus.  V,VII: Mild right facial droop. States "no" when asked if he feels FT to face bilaterally, but reacts to both stimuli. Also grimaces to bilateral facial noxious stimuli.  VIII: hearing intact to some commands IX,X: Unable to visualize palate XI: Head at midline XII: midline tongue extension   Motor: Increased latency of motor responses x 4. Mild limb rigidity x 4. Decreased amplitude of movements of RUE and RLE relative to left, but without drift. Prominent tremor to LUE and distal RLE noted intermittently.  RUE 4/5 proximal and distal RLE 4+/5 proximal and distal LUE: 5/5 proximal and distal LLE 5/5 proximal and distal Sensory: Reacts to noxious stimuli proximally in all 4 limbs.  Deep Tendon Reflexes:  Upper extremities: Difficult to assess due to rigidity Bilateral patellae 2+ Bilateral achilles 0 Toes downgoing bilaterally.  Cerebellar: Difficulty following commands for FNF. No gross ataxia noted.  Gait: Deferred due to falls risk concerns  IMAGING: I have personally reviewed the radiological images below and agree with the radiology interpretations.  Ct Head Code Stroke Wo Contrast Ct Angio Head/Neck W Or Wo Contrast Result Date: 05/15/2017 IMPRESSION: 1. Negative CTA for emergent large vessel occlusion. 2. Interval evolution of recently seen intraparenchymal hematoma positioned within the left frontal lobe along the left DBS, with increased regional edema on today's exam without discernible acute blood products. Edema is largely vasogenic in appearance, and favored to be related to the recently identified evolving hematoma. Despite this, a superimposed small infarct in this region would be extremely difficult to exclude. ASPECTS = 10 3. Mild atherosclerotic change about the carotid bifurcations and carotid siphons without hemodynamically significant or correctable stenosis. 4. Atheromatous plaque at the origin of the vertebral arteries as well as the bilateral V4 segments with resultant moderate stenoses on the right and more severe stenosis on the left.   Echocardiogram:                                              PENDING EEG:                                                                   PENDING MRI Brain:                                                           PENDING    IMPRESSION: [name] is a 77 y.o. male with PMH of recent DBS placement for essential tremor. Per report, the batteries for the DBS stimulators have not yet been installed. Staples from the recent surgeries are still in place along the frontoparietal scalp bilaterally. CT imaging reveals:  Intraparenchymal hematoma in Left frontal lobe with vasogenic edema s/p DBS stimulator implantation - rule out acute CVA  Suspected  Etiology: needs MRI evaluation Resultant Symptoms:  right facial droop and right sided weakness Stroke Risk Factors: hyperlipidemia and hypertension Other Stroke Risk Factors: Advanced age, CAD  Outstanding Stroke Work-up Studies:     Echocardiogram:                                                    PENDING EEG:                                                                      PENDING MRI Brain:                                                              PENDING  05/16/2017 ASSESSMENT:   Neuro exam remains stable with Right sided deficits. Radiology consulted and patient is able to safely get MRI to evaluate for acute CVA. Daughter is in agreement to proceed with testing. EEG remains pending. No seizure activity reported.  PLAN  05/16/2017: Continue Aspirin/ Statin MRI Head to evaluate for Acute CVA Frequent neuro checks Telemetry monitoring PT/OT/SLP Consult PM & Rehab Consult Case Management /MSW Ongoing aggressive stroke risk factor management Patient's family counseled to be compliant with his antithrombotic medications Patient's family counseled on Lifestyle modifications including, Diet, Exercise, and Stress Follow up with Dr [facility] in 6 weeks  SEIZURES: EEG -PENDING No seizure activity reported overnight Maintain Seizure precautions  HYPERTENSION: Stable Permissive hypertension (OK if <220/120) for 24-48 hours post stroke and then gradually normalized within 5-7 days. Long term BP goal normotensive. May slowly  restart home B/P medications after 48 hours Home Meds: Cozaar, Inderal  HYPERLIPIDEMIA:    Component Value Date/Time   CHOL 115 05/16/2017 0713   TRIG 64 05/16/2017 0713   HDL 29 (L) 05/16/2017 0713   CHOLHDL 4.0 05/16/2017 0713   VLDL 13 05/16/2017 0713   LDLCALC 73 05/16/2017 0713  Home Meds:  Lipitor 40 mg LDL  goal < 70 Continued on Lipitor to 40 mg daily Continue statin at discharge  PRE- DIABETES: Lab Results  Component Value Date   HGBA1C 5.8 (H) 05/16/2017  HgbA1c goal < 7.0 Continue CBG monitoring and SSI to maintain glucose 140-180 mg/dl DM education   Other Active Problems: Active Problems:   Weakness    Hospital day # 1 VTE prophylaxis: SCD's  Diet : Fall precautions Diet Heart Room service appropriate? Yes; Fluid consistency: Thin   FAMILY UPDATES: family at bedside  TEAM UPDATES: Carney Living, MD   Prior Home Stroke Medications:  No antithrombotic  Discharge Stroke Meds:  Please discharge patient on ASA 81 mg daily but discharge medications: TBD   Disposition: 03-Skilled Nursing Facility Therapy Recs:               PENDING Follow Up:  Follow-up Information    Tat, Octaviano Batty, DO. Schedule an appointment as soon as possible for  a visit in 4 week(s).   Specialty:  Neurology Contact information: [address] [place]  [address] [place] [address] [phone]          [name], MD -PCP Follow up in 1-2 weeks      Assessment & plan discussed with with attending physician and they are in agreement.    Brita Romp Stroke Neurology Team 05/16/2017 3:13 PM  ATTENDING NOTE: I reviewed above note and agree with the assessment and plan. I have made any additions or clarifications directly to the above note. Pt was seen and examined.   77 year old male with history of a CAD status post CABG 2004, RSD right upper extremity, HLD, obesity, essential tremor had recent DBS placement on 05/04/17 by Dr. [name].  As per daughter,  patient started to have confusion, right-sided weakness since the procedure.  Initial CT of the head showed right pneumocephalus, but resolved on the following neuroimaging.  However repeat CT showed left frontal small bleeding along the probe tract.  Patient symptoms not improving, prompted daughter sent patient here for further evaluation.  Head CT showed left frontal hematoma resolved but with surrounding edema.  CTA head neck unremarkable with bilateral V4 stenosis.  On my examination, he has expressive aphasia, word finding difficulty, paraphasic errors, anomia, but able to repeat. Has right hemianopia, right mild facial droop and right UE plegia with increased tone.  Right upper extremity pain in touch consistent with RUE RDS.  Intermittent postural tremor left upper extremity and right lower extremity.  I wonder that his family considered confusion was actually aphasia.  Patient has no fever, WBC normal, nontoxic appearance, symptoms does not consistent with CNS infection.  His aphasia and right-sided weakness likely associated with left frontal hematoma and swelling.  EEG showed diffuse slowing with intermittent left focal slowing, no seizure.  Pending MRI with and without contrast.  LDL 73 and A1c 5.8.  Elevated TSH 7.022.  As hematoma has resolved, started on baby aspirin and continue Lipitor 40. Given aphasia and left arm weakness, post procedure complications, will consider CIR as more appropriate and reasonable rehab setting. Will follow.   Marvel Plan, MD PhD Stroke Neurology 05/16/2017 4:41 PM   To contact Stroke Continuity provider, please refer to [email or web address]. After hours, contact General Neurology

## 2017-05-16 NOTE — Evaluation (Signed)
Speech Language Pathology Evaluation Patient Details Name: Brendan [name] MRN: [id number] DOB: 03/20/1941 Today's Date: 05/16/2017 Time: [id number] SLP Time Calculation (min) (ACUTE ONLY): 10 min  Problem List:  Patient Active Problem List   Diagnosis Date Noted  . Weakness 05/15/2017  . Coronary artery disease involving native coronary artery of native heart without angina pectoris   . Glaucoma   . Anxiety state   . Cognitive disorder   . Urinary retention   . Traumatic hemorrhage of left cerebrum without loss of consciousness (HCC)   . Pressure injury of skin 05/05/2017  . Seborrheic dermatitis of scalp 10/05/2016  . Tremor, essential 10/03/2016  . Cough 06/27/2016  . Rhinitis, allergic 01/12/2014  . CHRONIC STABLE ANGINA 08/06/2013  . Obesity (BMI 30-39.9) 01/30/2013  . Lower extremity edema 01/30/2013  . S/P CABG x 3   . Atherosclerotic heart disease of native coronary artery with angina pectoris (HCC)   . Hyperlipidemia LDL goal <70   . Memory problem 07/01/2012  . Complex regional pain syndrome 05/24/2012  . Pleural plaque with presence of asbestos 05/15/2012  . Anemia, B12 deficiency 03/19/2012  . Atherosclerotic heart disease of artery bypass graft 09/16/2011  . BPH (benign prostatic hyperplasia) 08/08/2011  . Microscopic hematuria 02/27/2011  . Anxiety 10/03/2010  . Incontinence 07/21/2010  . ANGINA, STABLE 04/29/2008  . GERD 10/28/2007  . Essential tremor 08/30/2006  . HYPERTENSION, BENIGN 07/06/2006   Past Medical History:  Past Medical History:  Diagnosis Date  . Anxiety disorder   . Benign enlargement of prostate   . CAD in native artery 1997   Referred for CABG x3 in 2004 for LAD diagonal bifurcation lesion  . Cervical spinal stenosis   . Complex regional pain syndrome of right upper extremity    Right wrist; L arm  . Coronary atherosclerosis of artery bypass graft June 2013   Occluded SVG-D1; Cardiologist Dr. Herbie BaltimoreHarding  . Gait disorder   .  Gastroesophageal reflux disease   . Glaucoma   . Hiatal hernia    GI: Dr Evette CristalGanem  . Hyperlipidemia LDL goal <70   . Hypertension   . Memory loss    Mild  . Obesity   . Peripheral neuropathy    possible peripheral neuropathy  . S/P CABG x 3 2004    LIMA-LAD, SVG to diagonal, SVG to OM  . Tremor, essential    On Primidone  . Unstable angina pectoris [facility](HCC)  June 2013   Cardiac cath: occluded SVG-DI. Patent LIMA-LAD and SVG-OM. EF 45% with apical inferior HK.   Past Surgical History:  Past Surgical History:  Procedure Laterality Date  . CARDIAC CATHETERIZATION    . CATARACT EXTRACTION, BILATERAL    . CORONARY ARTERY BYPASS GRAFT  06/2002   LIMA-LAD, SVG-D1, SVG-OM  . EYE SURGERY    . KNEE SURGERY Left   . LEFT HEART CATHETERIZATION WITH CORONARY/GRAFT ANGIOGRAM N/A 10/13/2011   Procedure: LEFT HEART CATHETERIZATION WITH Isabel CapriceORONARY/GRAFT ANGIOGRAM;  Surgeon: Marykay Lexavid W Harding, MD;  [facility];  Service: Cardiovascular;  Laterality: N/A;  . MINOR PLACEMENT OF FIDUCIAL N/A 04/26/2017   Procedure: Fiducial placement;  Surgeon: Maeola HarmanStern, Joseph, MD;  [facility];  Service: Neurosurgery;  Laterality: N/A;  Fiducial placement  . NM MYOCAR PERF WALL MOTION  03/23/2009   protocol:Bruce, normal perfusion in all regions, post-stress EF 72%, exercise capacity 7METS, EKG negative for ischemia.  . Shoulder Orthoscopic Surgery   06/2003  . SUBTHALAMIC STIMULATOR INSERTION Bilateral 05/04/2017   Procedure: Bilateral  Deep brain stimulator placement;  Surgeon: Maeola Harman, MD;  Location: [facility] OR;  Service: Neurosurgery;  Laterality: Bilateral;  Bilateral deep brain stimulator placement  . TRANSTHORACIC ECHOCARDIOGRAM  April 2014   EF 55-60%; no regional wall motion abnormalities. Relatively normal study normal diastolic parameters/abnormal relaxation  . wrist ganglion cyst Left    HPI:  Brendan [name] a 77 y.o.malewithPMH is significant forCABG x3, Essential tremor, GERD, glaucoma, HTN,  Obesity, Urinary retention, seborrheic dermatitis presentingon 1/29withright sided weaknessand expressive aphasia. The patient's daughter said that she first noticed his right arm becoming weaker on 1/28 to the point to where he could not move that arm at all. Concurrently he would "have trouble getting the proper words out". Patient is currently in a rehab facility ([facility]) after having a deep brain stimulator placed on 1/18 secondary to an essential tremor. Per reports his rehab facility grew concerned with his developing weakness and sent him to the ED in the afternoon of 1/29. In the ED a code stroke was fired. 05/16/17 he underwent CT head and CTA which were negative for large vessel occlusion. These scans did show evolution of a known small hematoma that the patient developed after his deep brain stimulator. Per the read it is felt that the hematoma has been resorbed with underlying vasogenic edema filling that space. As mentioned above the patient had a DBS placed by Dr. [name] on 05/04/17 for tremors. Per reports from the patient's daughter, since having the DBS placed he has had some cognitive slowing, mild rigidity, and a drop in his baseline mental status. From the informed consent this was felt to be a normal acute sequale from the procedure. Neurosurgery and Neurology teams were waiting for patient to return closer to baseline before placing batteries and turning device on. Although a little unclear it appears that the patient was to be on gabapentin and primidone in the meantime.    Assessment / Plan / Recommendation Clinical Impression  Pt presents with nonfluent expressive aphasia c/b perseverative word finding deficits when naming common objects. Although pt able to expressive rote information (days of week, family names) he is unable to communicate his wants/needs or relay information. Receptively, he is able to follow 2 step simple directions. He also presents with cognitive deficits in the  areas of sustained attention, task initiation, awareness and mild speech intelligibility deficits related to new right side facial weakness. He is ~ 80% intelligible at the simple conversation level. His daughter present and states that these deficits are much worse than immediately following previous surgery on 05/04/17. Skilled ST services are indicated to address these deficits to increase pt's ability to express wants and needs, increase functional independence and reduce caregiver burden. Recommend consult for possilbe admission to Inpatient Rehab.     SLP Assessment  SLP Recommendation/Assessment: Patient needs continued Speech Lanaguage Pathology Services SLP Visit Diagnosis: Cognitive communication deficit (R41.841)    Follow Up Recommendations  Inpatient Rehab    Frequency and Duration min 2x/week  2 weeks      SLP Evaluation Cognition  Overall Cognitive Status: Impaired/Different from baseline Arousal/Alertness: Awake/alert Orientation Level: Oriented to person;Oriented to place;Disoriented to time;Disoriented to situation Attention: Sustained Sustained Attention: Impaired Sustained Attention Impairment: Verbal basic;Functional basic Memory: Impaired Memory Impairment: Decreased recall of new information Awareness: Impaired Awareness Impairment: Intellectual impairment Problem Solving: Impaired Problem Solving Impairment: Verbal basic       Comprehension  Auditory Comprehension Overall Auditory Comprehension: Appears within functional limits for tasks assessed Visual Recognition/Discrimination  Discrimination: Not tested Reading Comprehension Reading Status: Not tested    Expression Expression Primary Mode of Expression: Verbal Verbal Expression Overall Verbal Expression: Impaired Initiation: Impaired Automatic Speech: Name;Counting;Day of week Level of Generative/Spontaneous Verbalization: Phrase;Sentence Repetition: No impairment Naming: Impairment Responsive:  26-50% accurate Confrontation: Impaired Convergent: 25-49% accurate Divergent: Not tested Verbal Errors: Perseveration;Jargon;Aware of errors Pragmatics: Impairment Impairments: Abnormal affect Non-Verbal Means of Communication: Not applicable Written Expression Dominant Hand: Left Written Expression: Not tested(d/t tremors)   Oral / Motor  Oral Motor/Sensory Function Overall Oral Motor/Sensory Function: Mild impairment Facial ROM: Reduced right Facial Symmetry: Abnormal symmetry right Facial Strength: Reduced right Facial Sensation: Reduced right Lingual ROM: Within Functional Limits Lingual Symmetry: Within Functional Limits Lingual Strength: Within Functional Limits Lingual Sensation: Within Functional Limits Velum: Within Functional Limits Mandible: Within Functional Limits Motor Speech Overall Motor Speech: Appears within functional limits for tasks assessed Respiration: Within functional limits Phonation: Normal Resonance: Within functional limits Articulation: Within functional limitis Intelligibility: Intelligible Motor Planning: Witnin functional limits Motor Speech Errors: Not applicable   GO                    Bexlee Bergdoll 05/16/2017, 10:21 AM

## 2017-05-16 NOTE — Progress Notes (Signed)
Family Medicine Teaching Service Daily Progress Note Intern Pager: [phone]  Patient name: Brendan [name] Medical record number: [id number] Date of birth: 08/17/1940 Age: 77 y.o. Gender: male  Primary Care Provider: Freddrick MarchAmin, [name], MD Consultants: neurology Code Status: full  Pt Overview and Major Events to Date:  1/29 admitted to FPTS, Neurology consulted,  Assessment and Plan: RUE, RLE weakness  Mild aphasia  cognitive slowing Given negative CT scan for hemorrhage the leading items on differential are ischemic stroke, mass effect on basal ganglion from edema, seizure or sequelae from DBS stimulator to account for RUE weakness. Neuro exam unchanged from 1/29. Per neurosurgery, no MRI head and no need for lead replacement. Per neurosurgery recs will get EEG as this is possibly seizures leading to post-ictal sequale. - Appreciate neurology recommendations - appreciate neurosurgery recs - F/U EEG - vital signs per floor routine - gabapentin 300mg  tid - Primidone 50mg  bid - Fall precautions - Up with Assistance - passed bedside swallow, Heart healthy diet - saline lock iv - PT/OT/SLP eval and treat - Social work consult for placement back at rehab when ready - Head of bed elevated 30 degrees until passes swallow - Neuro checks q 4 hours - repeat lipid panel - Consider repeating echocardiogram - ASA 81 - SCDs   Essential tremor S/P DBS placement. Per daughter's report, neurosurgery and neurology teams would like for patient to be closer to baseline prior to placing batteries and starting stimulator. Will continue gabapentin and primidone while here. Management per neurology. - gabapentin 300mg  tid - primidone 50mg  bid - follow up neurology and neurosurgery recs   Hypertension BP of 129/84 on admission. 133/56 at most recent check. Takes cozaar, lasix, propranolol, nitrostat as home meds. No renal problems, will continue all of these home meds as he has been taking them at  snf. - continue cozaar 100mg  daily - continue lasix 20mg  daily - continue propranolol 80mg  daily - nitrostat as needed for chest pain - Permissive HTN up to 220/120 if MRI reveals stroke  S/P three vessel cabg and abnormal EKG Patient appears to have had a myoview in 2017 which was good. EF 59% at that visit. Does have trace to 1+ pitting edema in lower extremities. No sense or indication that patient is volume overloaded at this time. Nitrostat on board for chest pain.No change in management from outpatient regimen will need to follow up with cardiologist at next scheduled appointment. EKG poor quality but is showing RBBB and LAFB, Anteroseptal infarct, old Nonspecific T abnormalities, lateral leads, Minimal ST elevation, lateral leads - Will trend troponins given reading of minimal ST elevation - Telemetry as above - Repeat EKG if he has any chest pain  Elevated troponin Trop up to 0.04. Will continue to trend. No chest pain. Suspect a little volume overload given 1+ edema in BLE. Can consider lasix if increasing respiratory demands or volume overload increases. - will get echo as has been 18 months since stress test  HLD Last lipid panel 05/01/2017, cholesterol, HDL, LDL w/n/l.  -Can continue home atorvastatin 40mg  daily  GERD -Continue protonix 40mg  daily  Glaucoma  -Continue liquifilm eye drops  Normocytic Anemia: hgb down to 10.3 from 11.2. This appears to be largely dilutional given drop in plt and wbc. Down from 13.4 pre-op on 1/9. Unclear if he had blood loss from his surgery.  - daily CBC - can consider anemia panel in the future  FEN/GI: heart healthy PPx: SCDs, protonix  Disposition: snf  Subjective:  No complaints this am. Doing well, Still oriented to self. Tolerated breakfast  Objective: Temp:  [98.8 F (37.1 C)-99.4 F (37.4 C)] 99.4 F (37.4 C) (01/30 0459) Pulse Rate:  [66-99] 69 (01/30 0459) Resp:  [13-20] 18 (01/30 0459) BP: (110-153)/(53-84)  133/56 (01/30 0459) SpO2:  [95 %-98 %] 96 % (01/30 0459) Weight:  [195 lb 15.8 oz (88.9 kg)-202 lb 13.2 oz (92 kg)] 195 lb 15.8 oz (88.9 kg) (01/30 0201) Physical Exam: General: aox1 (self), elderly obese, caucasian male Cardiovascular: tachycardic, no murmur/rubs/gallops. Trace to 1+ edema in BLE. Respiratory: Lungs CTAB, no chest pain Gastrointestinal: soft, mild distention, no tenderness, obese, bowel sounds present MSK: unchanged from 1/29 RUE: 4/5 strength on flexion and extension of RUE, 5/5 grip strength, some rigidity noted in RUE RLE: 5/5 strength on extension of RLE, 5/5 with plantar flexion and dorsiflexion of R foot, resting tremor noted with activity and at rest, worse with activity LLE: 5/5 strength on flexion and extension of LLE, 5/5 strength plantar and dorsiflexion LUE: 5/5 strength on extension and flexion LUE, 5/5 grip strength. Tremor noted at rest and with activity, worse with activity. Derm: warm and dry. Well healed C shaped incisional scar on left frontal head Neuro: Oriented to self, not place or time. Weakness in RUE as above, loss of sensation to light touch in all extremities and face, winces in pain in all areas. Unable to shrug right shoulder, otherwise CN 2-12 intact. Psych: pleasant, appropriate  Laboratory: Recent Labs  Lab 05/15/17 1850 05/15/17 1904 05/16/17 0423  WBC 8.7  --  5.8  HGB 11.1* 11.2* 10.3*  HCT 32.7* 33.0* 30.1*  PLT 246  --  209   Recent Labs  Lab 05/15/17 1850 05/15/17 1904  NA 134* 136  K 3.6 3.6  CL 97* 97*  CO2 24  --   BUN 20 21*  CREATININE 1.33* 1.20  CALCIUM 8.7*  --   PROT 6.3*  --   BILITOT 0.5  --   ALKPHOS 80  --   ALT 43  --   AST 42*  --   GLUCOSE 214* 207*    Imaging/Diagnostic Tests: CLINICAL DATA:  Code stroke. Initial evaluation for acute right-sided weakness, aphasia.  EXAM: CT ANGIOGRAPHY HEAD AND NECK  TECHNIQUE: Multidetector CT imaging of the head and neck was performed using the  standard protocol during bolus administration of intravenous contrast. Multiplanar CT image reconstructions and MIPs were obtained to evaluate the vascular anatomy. Carotid stenosis measurements (when applicable) are obtained utilizing NASCET criteria, using the distal internal carotid diameter as the denominator.  CONTRAST:  <See Chart> ISOVUE-370 IOPAMIDOL (ISOVUE-370) INJECTION 76%  COMPARISON:  Prior CT from 05/06/2017.  FINDINGS: CT HEAD FINDINGS  Brain: Bilateral DBS implants in place, tips near the subthalamic nuclei. There has been interval evolution of previously seen parenchymal hematoma along the left DBS, with increased edema now seen within this region, although no acute blood products are now identified (series 3, image 22). A superimposed infarct would be difficult to exclude.  No other acute intracranial hemorrhage. No other acute large vessel territory infarct. No mass lesion or midline shift. No hydrocephalus. No discernible extra-axial fluid collection. Few scattered foci of postoperative pneumocephalus remain, decreased from previous.  Vascular: No hyperdense vessel.  Skull: Scalp soft tissues and calvarium demonstrate no acute abnormality. DBS implants noted.  Sinuses: Globes and orbital soft tissues within normal limits. Patient status post lens extraction bilaterally. Paranasal sinuses and mastoid air cells are clear.  Orbits:  None.  ASPECTS (Alberta Stroke Program Early CT Score)  - Ganglionic level infarction (caudate, lentiform nuclei, internal capsule, insula, M1-M3 cortex): 7  - Supraganglionic infarction (M4-M6 cortex): 3  Total score (0-10 with 10 being normal): 10  Review of the MIP images confirms the above findings  CTA NECK FINDINGS  Aortic arch: Visualized aortic arch of normal caliber with normal branch pattern. 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 No hemodynamically significant stenosis  about the origin of the great vessels. Visualized subclavian arteries widely patent.  Right carotid system: Right common carotid artery widely patent to the bifurcation. Minimal centric calcified plaque about the right bifurcation without significant stenosis. Right ICA widely patent distally to the skull base without stenosis, dissection, or occlusion.  Left carotid system: Left common carotid artery patent from its origin to the bifurcation without stenosis. Mild scattered calcified plaque about the left bifurcation without hemodynamically significant stenosis. Left ICA patent to the skull base without stenosis, dissection, or occlusion.  Vertebral arteries: Both of the vertebral arteries arise from the subclavian arteries. Right vertebral artery dominant. Focal plaque at the origin of the vertebral arteries bilaterally with moderate stenosis on the right and more severe stenosis on the left. Vertebral arteries otherwise widely patent within the neck without stenosis, dissection, or occlusion.  Skeleton: No acute osseous abnormality. No worrisome lytic or blastic osseous lesions. Endplate occipital assimilation noted. 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  Other neck: No acute soft tissue abnormality within the neck. Salivary glands normal. Thyroid normal. No adenopathy.  Upper chest: Visualized upper chest demonstrates no acute abnormality. Irregular pleural thickening noted at the anterolateral right upper lobe. Partially visualized lungs are otherwise clear.  Review of the MIP images confirms the above findings  CTA HEAD FINDINGS  Anterior circulation: Petrous segments widely patent bilaterally. Smooth calcified plaque throughout the cavernous ICAs with mild diffuse narrowing. Supraclinoid segments widely patent. ICA termini widely patent. A1 segments patent. Anterior communicating artery patent. Anterior cerebral arteries widely patent to their  distal aspects without stenosis. No M1 occlusion or stenosis. No proximal M2 occlusion. Distal MCA branches well perfused and symmetric.  Posterior circulation: Right vertebral artery dominant. Multifocal atheromatous plaque within the V4 segments bilaterally with moderate stenosis on the right and multifocal severe stenoses on the left. Posterior inferior cerebral arteries patent bilaterally. Basilar artery widely patent to its distal aspect. Superior cerebral arteries patent bilaterally. Both of the posterior cerebral arteries primarily supplied via the basilar and are well perfused to their distal aspects. Small bilateral posterior communicating arteries noted.  Venous sinuses: Patent.  Anatomic variants: None significant.  No aneurysm.  Delayed phase: Not performed.  Review of the MIP images confirms the above findings  IMPRESSION: 1. Negative CTA for emergent large vessel occlusion. 2. Interval evolution of recently seen intraparenchymal hematoma positioned within the left frontal lobe along the left DBS, with increased regional edema on today's exam without discernible acute blood products. Edema is largely vasogenic in appearance, and favored to be related to the recently identified evolving hematoma. Despite this, a superimposed small infarct in this region would be extremely difficult to exclude. ASPECTS = 10 3. Mild atherosclerotic change about the carotid bifurcations and carotid siphons without hemodynamically significant or correctable stenosis. 4. Atheromatous plaque at the origin of the vertebral arteries as well as the bilateral V4 segments with resultant moderate stenoses on the right and more severe stenosis on the left.  Critical Value/emergent results were called by telephone at the time of interpretation  on 05/15/2017 at 7:44 pm to Dr. Otelia Limes, who verbally acknowledged these results.   Electronically Signed   By: Rise Mu M.D.    On: 05/15/2017 20:22   Myrene Buddy, MD 05/16/2017, 8:59 AM PGY-1, [facility] Family Medicine FPTS Intern pager: [phone], text pages welcome

## 2017-05-16 NOTE — Progress Notes (Signed)
Spoke with Dr. Venetia MaxonStern, neurosurgery who placed patient's brain stimulator. Stated that upon review of imaging, patient's brain bleeding looks improved and resulting edema is a natural progression of stimulator placement, and do not feel the need for lead replacement. Also, MRI not necessary. Upon talking with patient's family, shaking of R hand occurred prior to weakness and may be related to seizures with resulting Todd's paralysis which can be seen after lead placement due to increased edema and bleeding. Recommended obtaining EEG and consider starting antiepileptics. Also stated that patient will be better served with intensive rehab with CIR, will place consultation for reevaluation.   Ellwood DenseAlison Rumball, DO PGY-1, [facility] 05/16/2017 10:24 AM

## 2017-05-16 NOTE — Discharge Summary (Cosign Needed)
Family Medicine Teaching [facility] Discharge Summary  Patient name: Brendan BoJerry W Dougal Medical record number: [id number] Date of birth: 10/06/1940 Age: 77 y.o. Gender: male Date of Admission: 05/15/2017  Date of Discharge: 05/18/2017 Admitting Physician: Myrene BuddyJacob Fletcher, MD  Primary Care Provider: Freddrick MarchAmin, [name], MD Consultants: neurology, neurosurgery  Indication for Hospitalization: right sided weakness  Discharge Diagnoses/Problem List:  Post Operative Hematoma from deep brain stimulator Persistent RUE, RLE weakness Essential tremor Hypertension Hyperlipidemia GERD Glaucoma Normocytic anemia Normocytic anemia  Disposition: CIR  Discharge Condition: Stable  Discharge Exam:  General: Somnolent but arousable,, elderly obese, caucasian male Cardiovascular:tachycardic, no murmur/rubs/gallops. Trace to 1+ edema in BLE. Respiratory:Lungs CTAB, no chest pain Gastrointestinal:soft, mild distention, no tenderness, obese, bowel sounds present WUJ:[id number]:unchanged from 1/29 RUE: 4/5 strength on flexion and extension of RUE, 5/5 grip strength, some rigidity noted in RUE RLE: 5/5 strength on extension of RLE, 5/5 with plantar flexion and dorsiflexion of R foot, resting tremor noted with activity and at rest, worse with activity Derm:warm and dry. Well healed C shaped incisional scar on left frontal head Neuro:Oriented to self, not place or time. Weakness in RUE as above. Psych:pleasant, appropriate  Brief Hospital Course:  77 year old male who presented on 1/29 with 24 hours of RUE>RLE weakness. Patient had deep brain stimulator placed on 05/04/2017. Batteries were not placed and device not turned on. Had some hematoma formation post-operatively. Had reported cognitive slowing after placement and was placed in snf. Weakness first noticed at snf who sent him to the ED. Code stroke fired upon his presentation to ED. CT of head showed no hemorrhagic stroke but could not rule out ischemic  stroke in setting of vasogenic edema 2/2 hematoma resolution. Other lab workup without any gross abnormalities. Neurology and Neurosurgery both evaluated patient 1/30. Obtained EEG to rule out seizures. EEG showed diffuse wave slowing that was non-specific for any abnormality. An echo was obtained on 1/30 which showed EF 55-60% with no wall motion abnormalities. MRI was obtained on 1/31 hematoma consistent with postoperative changes around deep brain stimulator. He was evaluated on 2/1 by CIR who felt he was a great candidate for rehab. He was discharged to CIR on 2/1.  Issues for Follow Up:  1. Neurology and neurosurgery recommendations during rehab 2. Stable hemoglobin, down from baseline.  Consider repeat CBC and anemia workup. 3. Decreased TSH, normal T4.  Consider repeat monitoring TSH as needed  Significant Procedures: EEG, Echo  Significant Labs and Imaging:  Recent Labs  Lab 05/15/17 1850 05/15/17 1904 05/16/17 0423  WBC 8.7  --  5.8  HGB 11.1* 11.2* 10.3*  HCT 32.7* 33.0* 30.1*  PLT 246  --  209   Recent Labs  Lab 05/15/17 1850 05/15/17 1904 05/18/17 0236  NA 134* 136 136  K 3.6 3.6 3.7  CL 97* 97* 102  CO2 24  --  25  GLUCOSE 214* 207* 238*  BUN 20 21* 11  CREATININE 1.33* 1.20 0.96  CALCIUM 8.7*  --  8.5*  ALKPHOS 80  --   --   AST 42*  --   --   ALT 43  --   --   ALBUMIN 3.2*  --   --       Results/Tests Pending at Time of Discharge: NoneDictation #1 [id number]  [id number]   Discharge Medications:  Allergies as of 05/18/2017      Reactions   Lisinopril Cough      Medication List    TAKE these medications  aspirin 81 MG chewable tablet Chew 1 tablet (81 mg total) by mouth daily. Start taking on:  05/19/2017   atorvastatin 40 MG tablet Commonly known as:  LIPITOR Take 1 tablet (40 mg total) by mouth daily.   fish oil-omega-3 fatty acids 1000 MG capsule Take 1 g by mouth 2 (two) times daily.   furosemide 20 MG tablet Commonly known as:   LASIX Take 1 tablet (20 mg total) by mouth daily. May take an  Extra dose if needed What changed:  additional instructions   gabapentin 300 MG capsule Commonly known as:  NEURONTIN Take 2 capsules (600 mg total) by mouth 3 (three) times daily. What changed:    when to take this  additional instructions   HYDROcodone-acetaminophen 5-325 MG tablet Commonly known as:  NORCO/VICODIN Take 1 tablet by mouth every 6 (six) hours as needed for moderate pain.   ketoconazole 2 % shampoo Commonly known as:  NIZORAL Apply 1 application topically 2 (two) times a week. What changed:  when to take this   loratadine 10 MG tablet Commonly known as:  CLARITIN Take 1 tablet (10 mg total) by mouth daily.   losartan 100 MG tablet Commonly known as:  COZAAR Take 1 tablet (100 mg total) by mouth daily.   nitroGLYCERIN 0.4 MG SL tablet Commonly known as:  NITROSTAT Place 1 tablet (0.4 mg total) under the tongue every 5 (five) minutes as needed for chest pain. call 911 if chest pain not better   omeprazole 20 MG capsule Commonly known as:  PRILOSEC Take 1 capsule (20 mg total) by mouth daily as needed (acid reflux).   primidone 50 MG tablet Commonly known as:  MYSOLINE Take 1 tablet (50 mg total) by mouth 2 (two) times daily. What changed:    how much to take  how to take this  when to take this  additional instructions  Another medication with the same name was removed. Continue taking this medication, and follow the directions you see here.   propranolol 80 MG tablet Commonly known as:  INDERAL Take 1 tablet (80 mg total) by mouth 2 (two) times daily.   sennosides-docusate sodium 8.6-50 MG tablet Commonly known as:  SENOKOT-S Take 1 tablet by mouth daily as needed for constipation.   SYSTANE BALANCE 0.6 % Soln Generic drug:  Propylene Glycol Place 1 drop into both eyes daily as needed (for dry eyes).       Discharge Instructions: Please refer to Patient Instructions  section of EMR for full details.  Patient was counseled important signs and symptoms that should prompt return to medical care, changes in medications, dietary instructions, activity restrictions, and follow up appointments.   Follow-Up Appointments: Follow-up Information    Tat, Octaviano Batty, DO. Schedule an appointment as soon as possible for a visit in 4 week(s).   Specialty:  Neurology Contact information: [address] [place]  [address] [place] [address] [phone]           Garnette Gunner, MD 05/18/2017, 12:25 PM PGY-1, [facility]

## 2017-05-16 NOTE — Progress Notes (Signed)
Physical medicine rehabilitation consult requested chart reviewed.recent discharge to [facility]. Patient just admitted late yesterday evening 05/15/2017. Plan to await physical and occupational therapy evaluation and follow-up with appropriate venue of care

## 2017-05-16 NOTE — Progress Notes (Signed)
EEG Completed; Results Pending  

## 2017-05-16 NOTE — Telephone Encounter (Signed)
Spoke with daughter.  Answered questions to best of my ability.  She expressed appreciation for my call.

## 2017-05-16 NOTE — Telephone Encounter (Signed)
Brendan [name], please find out how patient is doing post op in terms of speech and confusion.  Improving any?

## 2017-05-16 NOTE — Progress Notes (Signed)
Initial Nutrition Assessment  DOCUMENTATION CODES:   Obesity unspecified  INTERVENTION:  1. Ensure Enlive po TID, each supplement provides 350 kcal and 20 grams of protein  NUTRITION DIAGNOSIS:   Inadequate oral intake related to poor appetite as evidenced by per patient/family report.  GOAL:   Patient will meet greater than or equal to 90% of their needs  MONITOR:   PO intake, I & O's, Labs, Weight trends, Skin  REASON FOR ASSESSMENT:   Malnutrition Screening Tool    ASSESSMENT:   77 Y/O M with PMX of HTN, HLD, S/P bilateral deep brain stimulator electrode placement 2-3 weeks ago, CAD s/p stenting, Essential tremors brought in from SNF for persistent and worsening right sided weakness following surgery, has had some cognitive slowing since placement of deep brain stimulators  Spoke with patient at bedside. He was unable to recall much. He is not sure of diet history. Reports recent weight loss but unsure how much. He does report poor appetite. Can't remember what he ate for breakfast. According to RN, patient ate a little bit for breakfast. Normally drinks ensure at home. Appears his weight has fluctuated between 202-211 pounds over the past 7 months, now down to 195 pounds, a 16 pound/7.6% severe weight loss over 12 days. Unsure of accuracy.  Labs reviewed:   Medications reviewed and include:  Omega-3 fatty acids  NUTRITION - FOCUSED PHYSICAL EXAM:    Most Recent Value  Orbital Region  No depletion  Upper Arm Region  No depletion  Thoracic and Lumbar Region  No depletion  Buccal Region  No depletion  Temple Region  No depletion  Clavicle Bone Region  No depletion  Clavicle and Acromion Bone Region  No depletion  Scapular Bone Region  No depletion  Dorsal Hand  No depletion  Patellar Region  No depletion  Anterior Thigh Region  No depletion  Posterior Calf Region  No depletion  Edema (RD Assessment)  None  Hair  Reviewed  Eyes  Reviewed  Mouth  Reviewed  Skin   Reviewed  Nails  Reviewed       Diet Order:  Fall precautions Diet Heart Room service appropriate? Yes; Fluid consistency: Thin  EDUCATION NEEDS:   Not appropriate for education at this time  Skin:  Skin Assessment: Skin Integrity Issues: Skin Integrity Issues:: DTI, Incisions DTI: buttocks Incisions: head  Last BM:  PTA  Height:   Ht Readings from Last 1 Encounters:  05/16/17 5\' 9"  (1.753 m)    Weight:   Wt Readings from Last 1 Encounters:  05/16/17 195 lb 15.8 oz (88.9 kg)    Ideal Body Weight:  72.72 kg  BMI:  Body mass index is 28.94 kg/m.  Estimated Nutritional Needs:   Kcal:  1900-2100 calories  Protein:  95-110 grams  Fluid:  1.9-2.1L   Dionne AnoWilliam M. Kyesha Balla, MS, RD LDN Inpatient Clinical Dietitian Pager [phone]

## 2017-05-16 NOTE — Progress Notes (Addendum)
FMTS PLEASE CONTACT NEUROSURGEON REGARDING REPEATING MRI OF THE BRAIN THIS MORNING. THANKS.

## 2017-05-16 NOTE — Telephone Encounter (Signed)
error 

## 2017-05-16 NOTE — Telephone Encounter (Signed)
Spoke with Dr. Venetia MaxonStern.  He doesn't think that lead needs removed at this time.  Does not think that there is infection and thinks edema will likely resolve on own. Blood products already resolved.   He would like to see patient in inpatient rehab (I agree).  EEG is pending.

## 2017-05-16 NOTE — Progress Notes (Signed)
CRITICAL VALUE ALERT  Critical Value:  Troponin 0.04  Date & Time Notied:  [id number]  Provider Notified:Dr. Linwood Dibblesumball  Orders Received/Actions taken:  Repeat trop.

## 2017-05-16 NOTE — Progress Notes (Signed)
  Speech Language Pathology Treatment: Cognitive-Linquistic  Patient Details Name: Brendan [name] Lugar MRN: [id number] DOB: 01/13/1941 Today's Date: 05/16/2017 Time: [id number] SLP Time Calculation (min) (ACUTE ONLY): 10 min  Assessment / Plan / Recommendation Clinical Impression  Skilled treatment session focused on communication goals. SLP also facilitated session by providing Max redirection to task as he was frequently distracted by activity outside door and daughter's presence. After distractions removed, pt required Mod A to sustain attention to task for ~ 1 to 2 minutes. Pt required Max A semantic cues to name common objects in room with ~50% accuracy.  Education provided to daughter on how to cue pt for word finding deficits.    HPI HPI: Brendan MountainJerry [name] Kirkmanis a 77 y.o.malewithPMH is significant forCABG x3, Essential tremor, GERD, glaucoma, HTN, Obesity, Urinary retention, seborrheic dermatitis presentingon 1/29withright sided weaknessand expressive aphasia. The patient's daughter said that she first noticed his right arm becoming weaker on 1/28 to the point to where he could not move that arm at all. Concurrently he would "have trouble getting the proper words out". Patient is currently in a rehab facility ([facility]) after having a deep brain stimulator placed on 1/18 secondary to an essential tremor. Per reports his rehab facility grew concerned with his developing weakness and sent him to the ED in the afternoon of 1/29. In the ED a code stroke was fired. 05/16/17 he underwent CT head and CTA which were negative for large vessel occlusion. These scans did show evolution of a known small hematoma that the patient developed after his deep brain stimulator. Per the read it is felt that the hematoma has been resorbed with underlying vasogenic edema filling that space. As mentioned above the patient had a DBS placed by Dr. Venetia MaxonStern on 05/04/17 for tremors. Per reports from the patient's daughter, since  having the DBS placed he has had some cognitive slowing, mild rigidity, and a drop in his baseline mental status. From the informed consent this was felt to be a normal acute sequale from the procedure. Neurosurgery and Neurology teams were waiting for patient to return closer to baseline before placing batteries and turning device on. Although a little unclear it appears that the patient was to be on gabapentin and primidone in the meantime.       SLP Plan  Continue with current plan of care  Patient needs continued Speech Lanaguage Pathology Services    Recommendations                   General recommendations: Rehab consult Oral Care Recommendations: Oral care BID Follow up Recommendations: Inpatient Rehab SLP Visit Diagnosis: Cognitive communication deficit (W29.562(R41.841) Plan: Continue with current plan of care       GO                [name] 05/16/2017, 10:25 AM

## 2017-05-16 NOTE — Procedures (Signed)
ELECTROENCEPHALOGRAM REPORT  Date of Study: 05/16/2017  Patient's Name: Brendan BoJerry W Osterloh MRN: [id number] Date of Birth: 04-16-1941  Referring Provider: Dr. Marvel PlanJindong Xu  Clinical History: This is a 77 year old man s/p DBS with worsening weakness and aphasia  Medications: Primidone Gabapentin  Technical Summary: A multichannel digital EEG recording measured by the international 10-20 system with electrodes applied with paste and impedances below 5000 ohms performed as portable with EKG monitoring in an awake and drowsy patient.  Hyperventilation and photic stimulation were not performed.  The digital EEG was referentially recorded, reformatted, and digitally filtered in a variety of bipolar and referential montages for optimal display.   Description: The patient is awake and drowsy during the recording.  During maximal wakefulness, there is a symmetric, medium voltage 6-7 Hz posterior dominant rhythm that attenuates with eye opening. This is admixed with a moderate amount of diffuse 4-5 Hz theta and 2-3 Hz delta slowing of the waking background. There is additional occasional focal 4-5 Hz theta slowing over the left hemisphere, at times sharply contoured without clear epileptogenic potential. During drowsiness, there is an increase in theta and delta slowing of the background. Deeper stages of sleep were not seen. Hyperventilation and photic stimulation were not performed.  There were no epileptiform discharges or electrographic seizures seen.    EKG lead was unremarkable.  Impression: This awake and drowsy EEG is abnormal due to the presence of: 1. Moderate diffuse slowing of the waking background. 2. Additional occasional focal slowing over the left hemisphere  Clinical Correlation of the above findings indicates diffuse cerebral dysfunction that is non-specific in etiology and can be seen with hypoxic/ischemic injury, toxic/metabolic encephalopathies, neurodegenerative disorders, or  medication effect. Focal slowing over the left hemisphere indicates focal cerebral dysfunction in this region suggestive of underlying structural or physiologic abnormality. The absence of epileptiform discharges does not rule out a clinical diagnosis of epilepsy.  Clinical correlation is advised.   Patrcia DollyKaren Cresencia Asmus, M.D.

## 2017-05-16 NOTE — Telephone Encounter (Signed)
EEG already ordered.  Neurohospitalists already on case (Dr. Roda ShuttersXu).

## 2017-05-17 ENCOUNTER — Inpatient Hospital Stay (HOSPITAL_COMMUNITY): Payer: PPO

## 2017-05-17 DIAGNOSIS — G936 Cerebral edema: Secondary | ICD-10-CM

## 2017-05-17 DIAGNOSIS — I10 Essential (primary) hypertension: Secondary | ICD-10-CM

## 2017-05-17 DIAGNOSIS — I611 Nontraumatic intracerebral hemorrhage in hemisphere, cortical: Secondary | ICD-10-CM

## 2017-05-17 DIAGNOSIS — R4701 Aphasia: Secondary | ICD-10-CM

## 2017-05-17 DIAGNOSIS — R531 Weakness: Secondary | ICD-10-CM

## 2017-05-17 LAB — ECHOCARDIOGRAM COMPLETE
Height: 69 in
Weight: 3135.82 oz

## 2017-05-17 LAB — T4, FREE: Free T4: 0.9 ng/dL (ref 0.61–1.12)

## 2017-05-17 MED ORDER — GADOBENATE DIMEGLUMINE 529 MG/ML IV SOLN
20.0000 mL | Freq: Once | INTRAVENOUS | Status: AC
  Administered 2017-05-17: 18 mL via INTRAVENOUS

## 2017-05-17 NOTE — Progress Notes (Addendum)
NEUROHOSPITALISTS STROKE TEAM - DAILY PROGRESS NOTE   SUBJECTIVE (INTERVAL HISTORY) Daughter and wife are at the bedside. Patient is found laying in bed in NAD. No new events reported overnight. Neuro stable. MRI done no evidence of acute infarct or CNS infection, but evolving ICH and cerebral edema. Daughter also showed me previous video at home with concerns of seizure activities.     OBJECTIVE Lab Results: CBC:  Recent Labs  Lab 05/15/17 1850 05/15/17 1904 05/16/17 0423  WBC 8.7  --  5.8  HGB 11.1* 11.2* 10.3*  HCT 32.7* 33.0* 30.1*  MCV 96.2  --  97.7  PLT 246  --  209   BMP: Recent Labs  Lab 05/15/17 1850 05/15/17 1904  NA 134* 136  K 3.6 3.6  CL 97* 97*  CO2 24  --   GLUCOSE 214* 207*  BUN 20 21*  CREATININE 1.33* 1.20  CALCIUM 8.7*  --    Liver Function Tests:  Recent Labs  Lab 05/15/17 1850  AST 42*  ALT 43  ALKPHOS 80  BILITOT 0.5  PROT 6.3*  ALBUMIN 3.2*   Thyroid Function Studies:  Recent Labs    05/16/17 0713  TSH 7.022*   Cardiac Enzymes:  Recent Labs  Lab 05/16/17 0713 05/16/17 1230 05/16/17 1910  TROPONINI 0.04* 0.03* 0.03*   Coagulation Studies:  Recent Labs    05/15/17 1850  APTT 31  INR 1.05   Urinalysis:  Recent Labs  Lab 05/15/17 2023  COLORURINE YELLOW  APPEARANCEUR CLEAR  LABSPEC 1.013  PHURINE 5.0  GLUCOSEU 50*  HGBUR SMALL*  BILIRUBINUR NEGATIVE  KETONESUR NEGATIVE  PROTEINUR NEGATIVE  NITRITE NEGATIVE  LEUKOCYTESUR NEGATIVE   Urine Drug Screen:     Component Value Date/Time   LABOPIA NONE DETECTED 05/15/2017 2023   COCAINSCRNUR NONE DETECTED 05/15/2017 2023   LABBENZ NONE DETECTED 05/15/2017 2023   AMPHETMU NONE DETECTED 05/15/2017 2023   THCU NONE DETECTED 05/15/2017 2023   LABBARB POSITIVE (A) 05/15/2017 2023    Alcohol Level:  Recent Labs  Lab 05/15/17 1849  ETH <10   PHYSICAL EXAM Temp:  [97.8 F (36.6 C)-99.2 F (37.3 C)] 98 F (36.7  C) (01/31 2002) Pulse Rate:  [62-79] 62 (01/31 2002) Resp:  [18] 18 (01/31 2002) BP: (123-143)/(52-66) 123/53 (01/31 2002) SpO2:  [97 %-99 %] 98 % (01/31 2002) General - Well nourished, well developed, in no apparent distress HEENT: Surgical staples to frontoparietal scalp noted bilaterally Lungs: Respirations unlabored Ext: Warm and well perfused  Neurologic Examination: Mental Status: Awake and alert. Expressive > receptive dysphasia noted. Has difficulty naming common objects, pausing and appearing to be frustrated as he attempts to recall words. Some speech output is fluent and at other times speech is halting. Has difficulty following some commands and answering questions, often requiring repetition of the command/question by examiner.  Cranial Nerves: II:  Visual fields grossly intact to threat bilateraly; does not demonstrate understanding of more complex commands for visual field testing. Pupils equal and round.  III,IV, VI: EOMI full to left and right with saccadic pursuits noted. No nystagmus.  V,VII: Mild right facial droop. States "no" when asked if he feels FT to face bilaterally, but reacts to both stimuli. Also grimaces to bilateral facial noxious stimuli.  VIII: hearing intact to some commands IX,X: Unable to visualize palate XI: Head at midline XII: midline tongue extension  Motor: Increased latency of motor responses x 4. Mild limb rigidity x 4. Decreased amplitude of movements of RUE  and RLE relative to left, but without drift. Prominent tremor to LUE and distal RLE noted intermittently.  RUE 4/5 proximal and distal RLE 4+/5 proximal and distal LUE: 5/5 proximal and distal LLE 5/5 proximal and distal Sensory: Reacts to noxious stimuli proximally in all 4 limbs.  Deep Tendon Reflexes:  Upper extremities: Difficult to assess due to rigidity Bilateral patellae 2+ Bilateral achilles 0 Toes downgoing bilaterally.  Cerebellar: Difficulty following commands for FNF. No  gross ataxia noted.  Gait: Deferred due to falls risk concerns  IMAGING: I have personally reviewed the radiological images below and agree with the radiology interpretations.  Ct Head Code Stroke Wo Contrast Ct Angio Head/Neck W Or Wo Contrast Result Date: 05/15/2017 IMPRESSION: 1. Negative CTA for emergent large vessel occlusion. 2. Interval evolution of recently seen intraparenchymal hematoma positioned within the left frontal lobe along the left DBS, with increased regional edema on today's exam without discernible acute blood products. Edema is largely vasogenic in appearance, and favored to be related to the recently identified evolving hematoma. Despite this, a superimposed small infarct in this region would be extremely difficult to exclude. ASPECTS = 10 3. Mild atherosclerotic change about the carotid bifurcations and carotid siphons without hemodynamically significant or correctable stenosis. 4. Atheromatous plaque at the origin of the vertebral arteries as well as the bilateral V4 segments with resultant moderate stenoses on the right and more severe stenosis on the left.   Echocardiogram:                                               - Left ventricle: The cavity size was normal. Systolic function was   normal. The estimated ejection fraction was in the range of 55%   to 60%. Wall motion was normal; there were no regional wall   motion abnormalities. Left ventricular diastolic function   parameters were normal. - Left atrium: The atrium was mildly dilated. - Atrial septum: No defect or patent foramen ovale was identified.  EEG:    Impression: This awake and drowsy EEG is abnormal due to the presence of: 1. Moderate diffuse slowing of the waking background. 2. Additional occasional focal slowing over the left hemisphere Clinical Correlation of the above findings indicates diffuse cerebral dysfunction that is non-specific in etiology and can be seen with hypoxic/ischemic injury,  toxic/metabolic encephalopathies, neurodegenerative disorders, or medication effect. Focal slowing over the left hemisphere indicates focal cerebral dysfunction in this region suggestive of underlying structural or physiologic abnormality. The absence of epileptiform discharges does not rule out a clinical diagnosis of epilepsy.  Clinical correlation is advised.  MRI Brain:   4.5 x 3.1 by approximately 3 cm fluid collection surrounding the left deep brain stimulator electrode in the frontoparietal brain, with thin marginal enhancement and pronounced surrounding brain edema. Fluid level seen internally. This has enlarged when compared to the postprocedure exam of 05/06/2017, when the maximal diameter was approximately 3.5 cm. This is presumed to represent a postoperative hematoma undergoing evolution. It is not possible for me to exclude the possibility of infection, but I do not strongly favor that based on these imaging characteristics.  Mild edema surrounding the right deep brain stimulator electrode.     IMPRESSION: [name] is a 77 y.o. male with PMH of recent DBS placement for essential tremor. Per report, the batteries for the DBS stimulators have  not yet been installed. Staples from the recent surgeries are still in place along the frontoparietal scalp bilaterally. CT imaging reveals:  Intraparenchymal hematoma in Left frontal lobe with vasogenic edema s/p DBS stimulator implantation - rule out acute CVA  Suspected Etiology: needs MRI evaluation Resultant Symptoms:  right facial droop and right sided weakness Stroke Risk Factors: hyperlipidemia and hypertension Other Stroke Risk Factors: Advanced age, CAD to proceed with testing. EEG remains pending. No seizure activity reported.  PLAN  05/17/2017: Continue Aspirin/ Statin MRI no Acute CVA  EEG no seizure Frequent neuro checks Telemetry monitoring PT/OT/SLP Consult PM & Rehab Consult Case Management /MSW Ongoing  aggressive stroke risk factor management Patient's family counseled to be compliant with his antithrombotic medications Patient's family counseled on Lifestyle modifications including, Diet, Exercise, and Stress Follow up with Dr [facility] in 6 weeks  HYPERTENSION: Stable Permissive hypertension (OK if <220/120) for 24-48 hours post stroke and then gradually normalized within 5-7 days. Long term BP goal normotensive. May slowly restart home B/P medications after 48 hours Home Meds: Cozaar, Inderal  HYPERLIPIDEMIA:    Component Value Date/Time   CHOL 115 05/16/2017 0713   TRIG 64 05/16/2017 0713   HDL 29 (L) 05/16/2017 0713   CHOLHDL 4.0 05/16/2017 0713   VLDL 13 05/16/2017 0713   LDLCALC 73 05/16/2017 0713  Home Meds:  Lipitor 40 mg LDL  goal < 70 Continued on Lipitor to 40 mg daily Continue statin at discharge  PRE- DIABETES: Lab Results  Component Value Date   HGBA1C 5.8 (H) 05/16/2017  HgbA1c goal < 7.0 Continue CBG monitoring and SSI to maintain glucose 140-180 mg/dl DM education   Other Active Problems: Active Problems:   Essential hypertension   RSD (reflex sympathetic dystrophy)   Hyperlipidemia   Weakness   Intracerebral hemorrhage   Cerebral edema (HCC)   Right sided weakness    Hospital day # 2 VTE prophylaxis: SCD's  Diet : Fall precautions Diet Heart Room service appropriate? Yes; Fluid consistency: Thin   FAMILY UPDATES: family at bedside  TEAM UPDATES: Carney Living, MD   Prior Home Stroke Medications:  No antithrombotic  Discharge Stroke Meds:  Please discharge patient on ASA 81 mg daily but discharge medications: TBD   Disposition: 03-Skilled Nursing Facility Therapy Recs:               PENDING Follow Up:  Follow-up Information    Tat, Octaviano Batty, DO. Schedule an appointment as soon as possible for a visit in 4 week(s).   Specialty:  Neurology Contact information: [address] [place]  [address] [place]  [address] [phone]          [name], MD -PCP Follow up in 1-2 weeks      Assessment & plan discussed with with attending physician and they are in agreement.    Brita Romp Stroke Neurology Team 05/17/2017 10:39 PM  ATTENDING NOTE: I reviewed above note and agree with the assessment and plan. I have made any additions or clarifications directly to the above note. Pt was seen and examined.   77 year old male with history of a CAD status post CABG 2004, RSD right upper extremity, HLD, obesity, essential tremor had recent DBS placement on 05/04/17 by Dr. [name].  As per daughter, patient started to have confusion, right-sided weakness since the procedure.  Initial CT of the head showed right pneumocephalus, but resolved on the following neuroimaging.  However repeat CT showed left frontal small  bleeding along the probe tract.  Patient symptoms not improving, prompted daughter sent patient here for further evaluation.  Head CT showed left frontal hematoma resolved but with surrounding edema.  CTA head neck unremarkable only with bilateral V4 stenosis. EEG showed diffuse slowing with intermittent left focal slowing, no seizure.  MRI with and without contrast showed cerebral edema with evolving ICH which is larger than previous CT head.  LDL 73 and A1c 5.8.  Elevated TSH 7.022. Now on baby aspirin and continue Lipitor 40.    He still has expressive aphasia, word finding difficulty, perseveration with anomia, but able to repeat. Has right hemianopia, right mild facial droop and right UE plegia with increased tone.  Right upper extremity pain in touch consistent with RUE RDS.  Intermittent postural tremor left upper extremity and right lower extremity.  PT/OT recommended CIR which is appropriate.   I had long discussion with wife and daughter at bedside, updated pt current condition, treatment plan and potential prognosis. They expressed understanding and appreciation.   Marvel Plan,  MD PhD Stroke Neurology 05/17/2017 10:39 PM   To contact Stroke Continuity provider, please refer to [email or web address]. After hours, contact General Neurology

## 2017-05-17 NOTE — NC FL2 (Signed)
[place] MEDICAID FL2 LEVEL OF CARE SCREENING TOOL     IDENTIFICATION  Patient Name: Brendan [name] Birthdate: 02/12/1941 Sex: male Admission Date (Current Location): 05/15/2017  [facility] [place]   Facility and Address:  [facility], [address], [place], [place] [address]      Provider Number: [id number]  Attending Physician Name and Address:  Carney Livinghambliss, Marshall L, MD  Relative Name and Phone Number:       Current Level of Care: Hospital Recommended Level of Care: Skilled Nursing Facility Prior Approval Number:    Date Approved/Denied:   PASRR Number: [id number]  Discharge Plan: SNF    Current Diagnoses: Patient Active Problem List   Diagnosis Date Noted  . Intracerebral hemorrhage 05/16/2017  . Cerebral edema (HCC)   . Weakness 05/15/2017  . Coronary artery disease involving native coronary artery of native heart without angina pectoris   . Glaucoma   . Anxiety state   . Cognitive disorder   . Urinary retention   . Traumatic hemorrhage of left cerebrum without loss of consciousness (HCC)   . Pressure injury of skin 05/05/2017  . Seborrheic dermatitis of scalp 10/05/2016  . Tremor, essential 10/03/2016  . Cough 06/27/2016  . Rhinitis, allergic 01/12/2014  . CHRONIC STABLE ANGINA 08/06/2013  . Obesity (BMI 30-39.9) 01/30/2013  . Lower extremity edema 01/30/2013  . S/P CABG x 3   . Atherosclerotic heart disease of native coronary artery with angina pectoris (HCC)   . Hyperlipidemia   . Memory problem 07/01/2012  . RSD (reflex sympathetic dystrophy) 05/24/2012  . Pleural plaque with presence of asbestos 05/15/2012  . Anemia, B12 deficiency 03/19/2012  . Atherosclerotic heart disease of artery bypass graft 09/16/2011  . BPH (benign prostatic hyperplasia) 08/08/2011  . Microscopic hematuria 02/27/2011  . Anxiety 10/03/2010  . Incontinence 07/21/2010  . ANGINA, STABLE 04/29/2008  . GERD 10/28/2007  .  Essential tremor 08/30/2006  . Essential hypertension 07/06/2006    Orientation RESPIRATION BLADDER Height & Weight     Self, Place  Normal External catheter, Incontinent Weight: 195 lb 15.8 oz (88.9 kg) Height:  5\' 9"  (175.3 cm)  BEHAVIORAL SYMPTOMS/MOOD NEUROLOGICAL BOWEL NUTRITION STATUS      Continent Diet(cardiac)  AMBULATORY STATUS COMMUNICATION OF NEEDS Skin   Limited Assist Verbally Other (Comment)(DTI on buttocks- foam dressing PRN)                       Personal Care Assistance Level of Assistance  Bathing, Dressing Bathing Assistance: Limited assistance   Dressing Assistance: Limited assistance     Functional Limitations Info             SPECIAL CARE FACTORS FREQUENCY  PT (By licensed PT), OT (By licensed OT)     PT Frequency: 5/wk OT Frequency: 5/wk            Contractures      Additional Factors Info  Code Status, Allergies Code Status Info: FULL Allergies Info: Lisinopril           Current Medications (05/17/2017):  This is the current hospital active medication list Current Facility-Administered Medications  Medication Dose Route Frequency Provider Last Rate Last Dose  . acetaminophen (TYLENOL) tablet 650 mg  650 mg Oral Q4H PRN Myrene BuddyFletcher, Jacob, MD       Or  . acetaminophen (TYLENOL) solution 650 mg  650 mg Per Tube Q4H PRN Myrene BuddyFletcher, Jacob, MD  Or  . acetaminophen (TYLENOL) suppository 650 mg  650 mg Rectal Q4H PRN Myrene Buddy, MD      . aspirin chewable tablet 81 mg  81 mg Oral Daily Beaulah Dinning, MD   81 mg at 05/17/17 0915  . atorvastatin (LIPITOR) tablet 40 mg  40 mg Oral Daily Myrene Buddy, MD   40 mg at 05/17/17 0916  . feeding supplement (ENSURE ENLIVE) (ENSURE ENLIVE) liquid 237 mL  237 mL Oral BID BM Janit Pagan T, MD   237 mL at 05/17/17 1610  . furosemide (LASIX) tablet 20 mg  20 mg Oral Daily Myrene Buddy, MD   20 mg at 05/17/17 0915  . gabapentin (NEURONTIN) tablet 300 mg  300 mg Oral TID  Myrene Buddy, MD   300 mg at 05/17/17 0914  . loratadine (CLARITIN) tablet 10 mg  10 mg Oral Daily Myrene Buddy, MD   10 mg at 05/17/17 0915  . losartan (COZAAR) tablet 100 mg  100 mg Oral Daily Myrene Buddy, MD   100 mg at 05/17/17 9604  . nitroGLYCERIN (NITROSTAT) SL tablet 0.4 mg  0.4 mg Sublingual Q5 min PRN Myrene Buddy, MD      . omega-3 acid ethyl esters (LOVAZA) capsule 1 g  1 g Oral BID Myrene Buddy, MD      . pantoprazole (PROTONIX) EC tablet 40 mg  40 mg Oral Daily Myrene Buddy, MD   40 mg at 05/17/17 0915  . polyvinyl alcohol (LIQUIFILM TEARS) 1.4 % ophthalmic solution 1 drop  1 drop Both Eyes Daily PRN Myrene Buddy, MD      . primidone (MYSOLINE) tablet 50 mg  50 mg Oral BID Myrene Buddy, MD   50 mg at 05/17/17 0916  . propranolol (INDERAL) tablet 80 mg  80 mg Oral BID Myrene Buddy, MD   80 mg at 05/17/17 0915  . senna-docusate (Senokot-S) tablet 1 tablet  1 tablet Oral QHS PRN Myrene Buddy, MD       Facility-Administered Medications Ordered in Other Encounters  Medication Dose Route Frequency Provider Last Rate Last Dose  . 0.9 %  sodium chloride infusion   Intravenous Continuous Marykay Lex, MD      . sodium chloride 0.9 % injection 3 mL  3 mL Intravenous PRN Marykay Lex, MD         Discharge Medications: Please see discharge summary for a list of discharge medications.  Relevant Imaging Results:  Relevant Lab Results:   Additional Information SS# [id number]   Burna Sis, LCSW

## 2017-05-17 NOTE — Evaluation (Signed)
Occupational Therapy Evaluation Patient Details Name: Brendan [name] MRN: [id number] DOB: 11/30/1940 Today's Date: 05/17/2017    History of Present Illness 77yo male with 12-24 hours of R sided weakness, R UE much weaker than R LE. of note, he had a deep brain stimulator placed on 05/04/17 and had been receiving therapy at a rehabilitation facility PTA. CT negative for large bleed, per imaging notes all appears normal following deep brain stimulator placement. PMH anxiety, cervical stenosis, CPRS L UE, hx CABG x3, gait disorder, glaucoma, obesity, peripheral neuropathy, hx heart cath, hx knee surgery, deep brain stimulator placed 05/04/17.    Clinical Impression   At his baseline, pt is able to ambulate and perform personal care independently. He has difficulty with self feeding due to L UE tremor. Pt presents with R side weakness, impaired cognition, R inattention with visual field cut and poor balance. He requires total assist for ADL. Pt will need intensive rehab prior to return home with his supportive family. Will follow.    Follow Up Recommendations  Supervision/Assistance - 24 hour;CIR    Equipment Recommendations       Recommendations for Other Services       Precautions / Restrictions Precautions Precautions: Fall Restrictions Weight Bearing Restrictions: No      Mobility Bed Mobility Overal bed mobility: Needs Assistance Bed Mobility: Sidelying to Sit;Sit to Supine     Supine to sit: Max assist Sit to supine: Min guard   General bed mobility comments: assist to guide R LE, raise trunk and shift hips to EOB, total assist to pull up in bed  Transfers Overall transfer level: Needs assistance Equipment used: Rolling walker (2 wheeled) Transfers: Sit to/from Stand Sit to Stand: Mod assist         General transfer comment: assist to rise and balance from bed    Balance Overall balance assessment: Needs assistance   Sitting balance-Leahy Scale: Fair Sitting  balance - Comments: statically, with attempt to reach feet, had LOB backward     Standing balance-Leahy Scale: Poor Standing balance comment: reliant on UE support on walker                            ADL either performed or assessed with clinical judgement   ADL                                         General ADL Comments: Total assistance for all ADL at this time.      Vision   Additional Comments:  R visual field cut     Perception Perception Comments: mild R inattention   Praxis      Pertinent Vitals/Pain Pain Assessment: No/denies pain     Hand Dominance Left   Extremity/Trunk Assessment Upper Extremity Assessment Upper Extremity Assessment: RUE deficits/detail RUE Deficits / Details: tone WFL, no active movement noted RUE Sensation: decreased proprioception RUE Coordination: decreased fine motor;decreased gross motor LUE Deficits / Details: tremulous, very strong   Lower Extremity Assessment Lower Extremity Assessment: Defer to PT evaluation   Cervical / Trunk Assessment Cervical / Trunk Assessment: Kyphotic   Communication Communication Communication: Receptive difficulties;Expressive difficulties   Cognition Arousal/Alertness: Awake/alert Behavior During Therapy: WFL for tasks assessed/performed;Flat affect Overall Cognitive Status: Impaired/Different from baseline Area of Impairment: Following commands;Safety/judgement;Problem solving;Memory  Current Attention Level: Sustained Memory: Decreased short-term memory Following Commands: Follows one step commands with increased time     Problem Solving: Slow processing;Decreased initiation;Difficulty sequencing;Requires verbal cues;Requires tactile cues General Comments: pt frequently stating, "I don't know" reports memory deficits   General Comments       Exercises     Shoulder Instructions      Home Living Family/patient expects to be  discharged to:: Private residence Living Arrangements: Spouse/significant other Available Help at Discharge: Family;Available 24 hours/day Type of Home: House Home Access: Stairs to enter [employer] of Steps: 4 Entrance Stairs-Rails: Can reach both Home Layout: One level     Bathroom Shower/Tub: Tub/shower unit;Walk-in shower   Bathroom Toilet: Standard     Home Equipment: Environmental consultant - 2 wheels;Gilmer Mor - single point      Lives With: Spouse    Prior Functioning/Environment Level of Independence: Needs assistance  Gait / Transfers Assistance Needed: daughter reports he was very independent PTA  ADL's / Homemaking Assistance Needed: Assist with self-feeding and ADL due to tremors, showered and dressed himself            OT Problem List: Decreased strength;Decreased range of motion;Decreased activity tolerance;Impaired balance (sitting and/or standing);Decreased safety awareness;Decreased knowledge of use of DME or AE;Impaired vision/perception;Decreased coordination;Decreased cognition;Decreased knowledge of precautions      OT Treatment/Interventions: Self-care/ADL training;Therapeutic exercise;Energy conservation;DME and/or AE instruction;Therapeutic activities;Cognitive remediation/compensation;Patient/family education;Balance training    OT Goals(Current goals can be found in the care plan section) Acute Rehab OT Goals Patient Stated Goal: to get better  OT Goal Formulation: With family Time For Goal Achievement: 05/31/17 Potential to Achieve Goals: Good ADL Goals Pt Will Perform Eating: with mod assist;sitting Pt Will Perform Grooming: with mod assist;sitting Pt Will Perform Upper Body Bathing: with mod assist;sitting Pt Will Perform Upper Body Dressing: with mod assist;sitting Additional ADL Goal #1: Pt will demonstrate ability to perform wide range reaching/dynamic activities in sitting. Additional ADL Goal #2: Pt will locate and protect R UE from injury during  mobility with min verbal cues.  OT Frequency: Min 3X/week   Barriers to D/C:            Co-evaluation              AM-PAC PT "6 Clicks" Daily Activity     Outcome Measure Help from another person eating meals?: Total Help from another person taking care of personal grooming?: Total Help from another person toileting, which includes using toliet, bedpan, or urinal?: Total Help from another person bathing (including washing, rinsing, drying)?: Total Help from another person to put on and taking off regular upper body clothing?: Total Help from another person to put on and taking off regular lower body clothing?: Total 6 Click Score: 6   End of Session Equipment Utilized During Treatment: Gait belt;Rolling walker  Activity Tolerance: Patient tolerated treatment well Patient left: in bed;with call bell/phone within reach;with bed alarm set;with family/visitor present  OT Visit Diagnosis: Other abnormalities of gait and mobility (R26.89);Ataxia, unspecified (R27.0);Muscle weakness (generalized) (M62.81);Other symptoms and signs involving cognitive function;Hemiplegia and hemiparesis                Time: 1610-9604 OT Time Calculation (min): 26 min Charges:  OT General Charges $OT Visit: 1 Visit OT Evaluation $OT Eval Moderate Complexity: 1 Mod OT Treatments $Self Care/Home Management : 8-22 mins G-Codes:     05-29-17 Martie Round, OTR/L Pager: [phone]  Iran Planas, [place] Bailiff 2017/05/29, 2:43 PM

## 2017-05-17 NOTE — Progress Notes (Signed)
I met with patient, his wife and daughter.  I reviewed his daughter's video of his right arm twitching and have asked her to share the with Dr. Erlinda Hong.  This is worrisome for seizure activity, which may explain worsening right hand strength, although EEG not suggestive of seizure.  I have also spoken with Dr. Erlinda Hong and with Sharyn Lull at his insurance company, [employer], indicating that all the patient's treating physicians (Neurosurgery, Neurology, Rehab) had strongly advised that the patient be transferred to [facility] for inpatient Rehab.  This was initially denied, but given his recent return to the hospital, I believe it would be in the patient's best interests for him to go to CIR at this point.

## 2017-05-17 NOTE — Consult Note (Signed)
Physical Medicine and Rehabilitation Consult Reason for Consult:Decreased functional mobility Referring Physician: Internal medicine   HPI: Brendan [name] is a 77 y.o.right handed male with history of CAD with stenting as well as CABG, hypertension, memory loss and bilateral deep brain stimulator electrode placement per Dr. [name] 05/04/2017 for essential tremors. Prior to need for skilled nursing facility placement patient lived with spouse. Needed assistance with self feeding and ADLs due to tremors.Family did encourage the use of a rolling walker but patient refused.Patient was discharged to [facility] skilled nursing facility 05/11/2017 ambulating minimal assist level. Presented 05/15/2017 with increasing right sided weakness. CT of the head showed interval evolution of recently seen intraparenchymal hematoma positioned within the left frontal lobe along the left deep brain stimulator with increased regional edema Negative CTA.EEG moderate diffuse slowing no seizure activity. Echocardiogram pending. Neurology follow-up with workup ongoing with MRI of the brain pending.Maintained on a regular diet.formal physical and occupational therapy evaluations pending. M.D. Has requested physical medicine rehabilitation consult.   Review of Systems  Constitutional: Negative for chills and fever.  HENT: Negative for hearing loss.   Eyes: Negative for blurred vision and double vision.  Respiratory: Negative for cough and shortness of breath.   Cardiovascular: Positive for leg swelling. Negative for chest pain and palpitations.  Gastrointestinal: Positive for constipation. Negative for nausea and vomiting.       GERD  Genitourinary: Positive for urgency. Negative for dysuria, flank pain and hematuria.  Musculoskeletal: Positive for myalgias.  Skin: Negative for rash.  Neurological: Positive for tremors and focal weakness.  Psychiatric/Behavioral: Positive for memory loss.       Anxiety disorder  All  other systems reviewed and are negative.  Past Medical History:  Diagnosis Date  . Anxiety disorder   . Benign enlargement of prostate   . CAD in native artery 1997   Referred for CABG x3 in 2004 for LAD diagonal bifurcation lesion  . Cervical spinal stenosis   . Complex regional pain syndrome of right upper extremity    Right wrist; L arm  . Coronary atherosclerosis of artery bypass graft June 2013   Occluded SVG-D1; Cardiologist Dr. Herbie Baltimore  . Gait disorder   . Gastroesophageal reflux disease   . Glaucoma   . Hiatal hernia    GI: Dr Evette Cristal  . Hyperlipidemia LDL goal <70   . Hypertension   . Memory loss    Mild  . Obesity   . Peripheral neuropathy    possible peripheral neuropathy  . S/P CABG x 3 2004    LIMA-LAD, SVG to diagonal, SVG to OM  . Tremor, essential    On Primidone  . Unstable angina pectoris [facility])  June 2013   Cardiac cath: occluded SVG-DI. Patent LIMA-LAD and SVG-OM. EF 45% with apical inferior HK.   Past Surgical History:  Procedure Laterality Date  . CARDIAC CATHETERIZATION    . CATARACT EXTRACTION, BILATERAL    . CORONARY ARTERY BYPASS GRAFT  06/2002   LIMA-LAD, SVG-D1, SVG-OM  . EYE SURGERY    . KNEE SURGERY Left   . LEFT HEART CATHETERIZATION WITH CORONARY/GRAFT ANGIOGRAM N/A 10/13/2011   Procedure: LEFT HEART CATHETERIZATION WITH Isabel Caprice;  Surgeon: Marykay Lex, MD;  Location: [facility] CATH LAB;  Service: Cardiovascular;  Laterality: N/A;  . MINOR PLACEMENT OF FIDUCIAL N/A 04/26/2017   Procedure: Fiducial placement;  Surgeon: Maeola Harman, MD;  Location: [facility] OR;  Service: Neurosurgery;  Laterality: N/A;  Fiducial placement  . NM  MYOCAR PERF WALL MOTION  03/23/2009   protocol:Bruce, normal perfusion in all regions, post-stress EF 72%, exercise capacity , EKG negative for ischemia.  . Shoulder Orthoscopic Surgery   06/2003  . SUBTHALAMIC STIMULATOR INSERTION Bilateral 05/04/2017   Procedure: Bilateral Deep brain stimulator placement;   Surgeon: Maeola Harman, MD;  Location: [facility] OR;  Service: Neurosurgery;  Laterality: Bilateral;  Bilateral deep brain stimulator placement  . TRANSTHORACIC ECHOCARDIOGRAM  April 2014   EF 55-60%; no regional wall motion abnormalities. Relatively normal study normal diastolic parameters/abnormal relaxation  . wrist ganglion cyst Left    Family History  Problem Relation Age of Onset  . Heart disease Mother   . Heart disease Brother   . Tremor Paternal Uncle   . Heart disease Brother   . Drug abuse Daughter   . Hepatitis C Daughter    Social History:  reports that he quit smoking about 49 years ago. His smoking use included cigarettes. He has a 60.00 pack-year smoking history. he has never used smokeless tobacco. He reports that he does not drink alcohol or use drugs. Allergies:  Allergies  Allergen Reactions  . Lisinopril Cough   Medications Prior to Admission  Medication Sig Dispense Refill  . atorvastatin (LIPITOR) 40 MG tablet Take 1 tablet (40 mg total) by mouth daily. 90 tablet 3  . fish oil-omega-3 fatty acids 1000 MG capsule Take 1 g by mouth 2 (two) times daily.    . furosemide (LASIX) 20 MG tablet Take 1 tablet (20 mg total) by mouth daily. May take an  Extra dose if needed (Patient taking differently: Take 20 mg by mouth daily. ) 35 tablet 11  . HYDROcodone-acetaminophen (NORCO/VICODIN) 5-325 MG tablet Take 1 tablet by mouth every 6 (six) hours as needed for moderate pain.    [name] ketoconazole (NIZORAL) 2 % shampoo Apply 1 application topically 2 (two) times a week. (Patient taking differently: Apply 1 application topically once a week. ) 120 mL 1  . loratadine (CLARITIN) 10 MG tablet Take 1 tablet (10 mg total) by mouth daily. 30 tablet 11  . losartan (COZAAR) 100 MG tablet Take 1 tablet (100 mg total) by mouth daily. 90 tablet 3  . nitroGLYCERIN (NITROSTAT) 0.4 MG SL tablet Place 1 tablet (0.4 mg total) under the tongue every 5 (five) minutes as needed for chest pain. call 911 if  chest pain not better 30 tablet 3  . omeprazole (PRILOSEC) 20 MG capsule Take 1 capsule (20 mg total) by mouth daily as needed (acid reflux). 90 capsule 3  . propranolol (INDERAL) 80 MG tablet Take 1 tablet (80 mg total) by mouth 2 (two) times daily. 180 tablet 5  . Propylene Glycol (SYSTANE BALANCE) 0.6 % SOLN Place 1 drop into both eyes daily as needed (for dry eyes).    . sennosides-docusate sodium (SENOKOT-S) 8.6-50 MG tablet Take 1 tablet by mouth daily as needed for constipation.    . gabapentin (NEURONTIN) 300 MG capsule Take 2 capsules (600 mg total) by mouth 3 (three) times daily. (Patient taking differently: Take 600 mg by mouth at bedtime. x3 days. Then stop on 05/18/17.) 180 capsule 5  . primidone (MYSOLINE) 250 MG tablet Take 250 mg by mouth 2 (two) times daily.    . primidone (MYSOLINE) 50 MG tablet 04/19/17: 250 mg am, 200 mg hs; 04/20/17: 200 mg am, 200 mg hs, 04/21/17: 200 mg am, 150 mg hs; 1/6-1/7: 150 mg BID; 1/8-1/9: 150 mg am, 100 mg hs; 1/10-1/11: 100 mg BID;  1/12-1/13: 100 mg am, 50 mg hs; 1/14: 50 mg BID, 1/15: 50 mg QD, 1/16: Stop (Patient not taking: Reported on 05/15/2017) 60 tablet 0    Home: Home Living Family/patient expects to be discharged to:: Private residence Living Arrangements: Alone Available Help at Discharge: Family, Available 24 hours/day Type of Home: House Home Access: Stairs to enter Secretary/administrator of Steps: 4 Entrance Stairs-Rails: Can reach both Home Layout: One level Bathroom Shower/Tub: Tub/shower unit, Health visitor: Standard Home Equipment: Environmental consultant - 2 wheels, Medical laboratory scientific officer - single point  Lives With: Spouse  Functional History: Prior Function Level of Independence: Independent Gait / Transfers Assistance Needed: daughter reports he was very independent PTA  ADL's / Homemaking Assistance Needed: Assist with self-feeding and ADL due to tremors.  Functional Status:  Mobility: Bed Mobility Overal bed mobility: Needs Assistance Bed  Mobility: Supine to Sit Supine to sit: Mod assist General bed mobility comments: needs help powering up trunk/pivoting to EOB safely due to sequencing difficulty  Transfers Overall transfer level: Needs assistance Equipment used: Rolling walker (2 wheeled) Transfers: Sit to/from Stand Sit to Stand: Min assist General transfer comment: VC/TC for technique  Ambulation/Gait Ambulation/Gait assistance: Min assist Ambulation Distance (Feet): 20 Feet Assistive device: Rolling walker (2 wheeled) Gait Pattern/deviations: Step-through pattern, Decreased step length - right, Decreased step length - left, Decreased dorsiflexion - right, Decreased dorsiflexion - left, Shuffle, Trunk flexed General Gait Details: MinA to navigate with walker especially on turns, VC for navigation in room     ADL:    Cognition: Cognition Overall Cognitive Status: Impaired/Different from baseline Arousal/Alertness: Awake/alert Orientation Level: Oriented to person, Oriented to place, Disoriented to time, Disoriented to situation Attention: Sustained Sustained Attention: Impaired Sustained Attention Impairment: Verbal basic, Functional basic Memory: Impaired Memory Impairment: Decreased recall of new information Awareness: Impaired Awareness Impairment: Intellectual impairment Problem Solving: Impaired Problem Solving Impairment: Verbal basic Cognition Arousal/Alertness: Awake/alert Behavior During Therapy: WFL for tasks assessed/performed, Flat affect Overall Cognitive Status: Impaired/Different from baseline Area of Impairment: Safety/judgement, Problem solving Memory: Decreased recall of precautions, Decreased short-term memory Following Commands: Follows one step commands consistently, Follows one step commands with increased time Problem Solving: Slow processing, Decreased initiation, Difficulty sequencing, Requires verbal cues, Requires tactile cues Difficult to assess due to: Impaired  communication  Blood pressure 133/63, pulse 79, temperature 98.3 F (36.8 C), resp. rate 18, height 5\' 9"  (1.753 m), weight 88.9 kg (195 lb 15.8 oz), SpO2 98 %. Physical Exam  Vitals reviewed. HENT:  Head: Normocephalic.  Eyes:  Pupils reactive to light  Neck: Normal range of motion. Neck supple. No thyromegaly present.  Cardiovascular: Normal rate, regular rhythm and normal heart sounds.  Respiratory: Effort normal and breath sounds normal. No respiratory distress.  GI: Soft. Bowel sounds are normal. He exhibits no distension.  Neurological:  Patient is alert. Pleasantly confused. He stated he was 77 years old. He did follow simple commands. Appropriate for simple yes and no questions.  Ongoing left-sided tremor.  Left upper extremity grossly 4 out of 5.  Right lower extremity is 1+ to 2 out of 5 depending upon the amount he is able to engage the limb.  Right lower extremity is 3+ to 4 out of 5 grossly.  Sensation diminished slightly right arm and leg but able to sense pain.  Mild right inattention.  Patient with decreased sitting balance and needed cues for posture.  Stood up with walker and had difficulties grabbing the walker due to his right arm weakness and  inattention but did fairly well although with a shuffling gait while walking with physical therapy  Skin: Skin is warm and dry.    Results for orders placed or performed during the hospital encounter of 05/15/17 (from the past 24 hour(s))  Troponin I (q 6hr x 3)     Status: Abnormal   Collection Time: 05/16/17  7:13 AM  Result Value Ref Range   Troponin I 0.04 (HH) <0.03 ng/mL  Lipid panel     Status: Abnormal   Collection Time: 05/16/17  7:13 AM  Result Value Ref Range   Cholesterol 115 0 - 200 mg/dL   Triglycerides 64 <161 mg/dL   HDL 29 (L) >09 mg/dL   Total CHOL/HDL Ratio 4.0 RATIO   VLDL 13 0 - 40 mg/dL   LDL Cholesterol 73 0 - 99 mg/dL  TSH     Status: Abnormal   Collection Time: 05/16/17  7:13 AM  Result Value Ref  Range   TSH 7.022 (H) 0.350 - 4.500 uIU/mL  Hemoglobin A1c     Status: Abnormal   Collection Time: 05/16/17  7:13 AM  Result Value Ref Range   Hgb A1c MFr Bld 5.8 (H) 4.8 - 5.6 %   Mean Plasma Glucose 119.76 mg/dL  Troponin I (q 6hr x 3)     Status: Abnormal   Collection Time: 05/16/17 12:30 PM  Result Value Ref Range   Troponin I 0.03 (HH) <0.03 ng/mL  Troponin I (q 6hr x 3)     Status: Abnormal   Collection Time: 05/16/17  7:10 PM  Result Value Ref Range   Troponin I 0.03 (HH) <0.03 ng/mL   Ct Angio Head W Or Wo Contrast  Result Date: 05/15/2017 CLINICAL DATA:  Code stroke. Initial evaluation for acute right-sided weakness, aphasia. EXAM: CT ANGIOGRAPHY HEAD AND NECK TECHNIQUE: Multidetector CT imaging of the head and neck was performed using the standard protocol during bolus administration of intravenous contrast. Multiplanar CT image reconstructions and MIPs were obtained to evaluate the vascular anatomy. Carotid stenosis measurements (when applicable) are obtained utilizing NASCET criteria, using the distal internal carotid diameter as the denominator. CONTRAST:  <See Chart> ISOVUE-370 IOPAMIDOL (ISOVUE-370) INJECTION 76% COMPARISON:  Prior CT from 05/06/2017. FINDINGS: CT HEAD FINDINGS Brain: Bilateral DBS implants in place, tips near the subthalamic nuclei. There has been interval evolution of previously seen parenchymal hematoma along the left DBS, with increased edema now seen within this region, although no acute blood products are now identified (series 3, image 22). A superimposed infarct would be difficult to exclude. No other acute intracranial hemorrhage. No other acute large vessel territory infarct. No mass lesion or midline shift. No hydrocephalus. No discernible extra-axial fluid collection. Few scattered foci of postoperative pneumocephalus remain, decreased from previous. Vascular: No hyperdense vessel. Skull: Scalp soft tissues and calvarium demonstrate no acute abnormality.  DBS implants noted. Sinuses: Globes and orbital soft tissues within normal limits. Patient status post lens extraction bilaterally. Paranasal sinuses and mastoid air cells are clear. Orbits: None. ASPECTS (Alberta Stroke Program Early CT Score) - Ganglionic level infarction (caudate, lentiform nuclei, internal capsule, insula, M1-M3 cortex): 7 - Supraganglionic infarction (M4-M6 cortex): 3 Total score (0-10 with 10 being normal): 10 Review of the MIP images confirms the above findings CTA NECK FINDINGS Aortic arch: Visualized aortic arch of normal caliber with normal branch pattern. 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 No hemodynamically significant stenosis about the origin of the great vessels. Visualized subclavian arteries widely patent. Right carotid system:  Right common carotid artery widely patent to the bifurcation. Minimal centric calcified plaque about the right bifurcation without significant stenosis. Right ICA widely patent distally to the skull base without stenosis, dissection, or occlusion. Left carotid system: Left common carotid artery patent from its origin to the bifurcation without stenosis. Mild scattered calcified plaque about the left bifurcation without hemodynamically significant stenosis. Left ICA patent to the skull base without stenosis, dissection, or occlusion. Vertebral arteries: Both of the vertebral arteries arise from the subclavian arteries. Right vertebral artery dominant. Focal plaque at the origin of the vertebral arteries bilaterally with moderate stenosis on the right and more severe stenosis on the left. Vertebral arteries otherwise widely patent within the neck without stenosis, dissection, or occlusion. Skeleton: No acute osseous abnormality. No worrisome lytic or blastic osseous lesions. Endplate occipital assimilation noted. 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 Other neck: No acute soft tissue abnormality within the neck. Salivary  glands normal. Thyroid normal. No adenopathy. Upper chest: Visualized upper chest demonstrates no acute abnormality. Irregular pleural thickening noted at the anterolateral right upper lobe. Partially visualized lungs are otherwise clear. Review of the MIP images confirms the above findings CTA HEAD FINDINGS Anterior circulation: Petrous segments widely patent bilaterally. Smooth calcified plaque throughout the cavernous ICAs with mild diffuse narrowing. Supraclinoid segments widely patent. ICA termini widely patent. A1 segments patent. Anterior communicating artery patent. Anterior cerebral arteries widely patent to their distal aspects without stenosis. No M1 occlusion or stenosis. No proximal M2 occlusion. Distal MCA branches well perfused and symmetric. Posterior circulation: Right vertebral artery dominant. Multifocal atheromatous plaque within the V4 segments bilaterally with moderate stenosis on the right and multifocal severe stenoses on the left. Posterior inferior cerebral arteries patent bilaterally. Basilar artery widely patent to its distal aspect. Superior cerebral arteries patent bilaterally. Both of the posterior cerebral arteries primarily supplied via the basilar and are well perfused to their distal aspects. Small bilateral posterior communicating arteries noted. Venous sinuses: Patent. Anatomic variants: None significant.  No aneurysm. Delayed phase: Not performed. Review of the MIP images confirms the above findings IMPRESSION: 1. Negative CTA for emergent large vessel occlusion. 2. Interval evolution of recently seen intraparenchymal hematoma positioned within the left frontal lobe along the left DBS, with increased regional edema on today's exam without discernible acute blood products. Edema is largely vasogenic in appearance, and favored to be related to the recently identified evolving hematoma. Despite this, a superimposed small infarct in this region would be extremely difficult to  exclude. ASPECTS = 10 3. Mild atherosclerotic change about the carotid bifurcations and carotid siphons without hemodynamically significant or correctable stenosis. 4. Atheromatous plaque at the origin of the vertebral arteries as well as the bilateral V4 segments with resultant moderate stenoses on the right and more severe stenosis on the left. Critical Value/emergent results were called by telephone at the time of interpretation on 05/15/2017 at 7:44 pm to Dr. Otelia Limes, who verbally acknowledged these results. Electronically Signed   By: Rise Mu M.D.   On: 05/15/2017 20:22   Ct Angio Neck W Or Wo Contrast  Result Date: 05/15/2017 CLINICAL DATA:  Code stroke. Initial evaluation for acute right-sided weakness, aphasia. EXAM: CT ANGIOGRAPHY HEAD AND NECK TECHNIQUE: Multidetector CT imaging of the head and neck was performed using the standard protocol during bolus administration of intravenous contrast. Multiplanar CT image reconstructions and MIPs were obtained to evaluate the vascular anatomy. Carotid stenosis measurements (when applicable) are obtained utilizing NASCET criteria, using the distal internal carotid diameter  as the denominator. CONTRAST:  <See Chart> ISOVUE-370 IOPAMIDOL (ISOVUE-370) INJECTION 76% COMPARISON:  Prior CT from 05/06/2017. FINDINGS: CT HEAD FINDINGS Brain: Bilateral DBS implants in place, tips near the subthalamic nuclei. There has been interval evolution of previously seen parenchymal hematoma along the left DBS, with increased edema now seen within this region, although no acute blood products are now identified (series 3, image 22). A superimposed infarct would be difficult to exclude. No other acute intracranial hemorrhage. No other acute large vessel territory infarct. No mass lesion or midline shift. No hydrocephalus. No discernible extra-axial fluid collection. Few scattered foci of postoperative pneumocephalus remain, decreased from previous. Vascular: No  hyperdense vessel. Skull: Scalp soft tissues and calvarium demonstrate no acute abnormality. DBS implants noted. Sinuses: Globes and orbital soft tissues within normal limits. Patient status post lens extraction bilaterally. Paranasal sinuses and mastoid air cells are clear. Orbits: None. ASPECTS (Alberta Stroke Program Early CT Score) - Ganglionic level infarction (caudate, lentiform nuclei, internal capsule, insula, M1-M3 cortex): 7 - Supraganglionic infarction (M4-M6 cortex): 3 Total score (0-10 with 10 being normal): 10 Review of the MIP images confirms the above findings CTA NECK FINDINGS Aortic arch: Visualized aortic arch of normal caliber with normal branch pattern. 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 No hemodynamically significant stenosis about the origin of the great vessels. Visualized subclavian arteries widely patent. Right carotid system: Right common carotid artery widely patent to the bifurcation. Minimal centric calcified plaque about the right bifurcation without significant stenosis. Right ICA widely patent distally to the skull base without stenosis, dissection, or occlusion. Left carotid system: Left common carotid artery patent from its origin to the bifurcation without stenosis. Mild scattered calcified plaque about the left bifurcation without hemodynamically significant stenosis. Left ICA patent to the skull base without stenosis, dissection, or occlusion. Vertebral arteries: Both of the vertebral arteries arise from the subclavian arteries. Right vertebral artery dominant. Focal plaque at the origin of the vertebral arteries bilaterally with moderate stenosis on the right and more severe stenosis on the left. Vertebral arteries otherwise widely patent within the neck without stenosis, dissection, or occlusion. Skeleton: No acute osseous abnormality. No worrisome lytic or blastic osseous lesions. Endplate occipital assimilation noted. Moderate degenerate  spondylolysis present at C5-6. Other neck: No acute soft tissue abnormality within the neck. Salivary glands normal. Thyroid normal. No adenopathy. Upper chest: Visualized upper chest demonstrates no acute abnormality. Irregular pleural thickening noted at the anterolateral right upper lobe. Partially visualized lungs are otherwise clear. Review of the MIP images confirms the above findings CTA HEAD FINDINGS Anterior circulation: Petrous segments widely patent bilaterally. Smooth calcified plaque throughout the cavernous ICAs with mild diffuse narrowing. Supraclinoid segments widely patent. ICA termini widely patent. A1 segments patent. Anterior communicating artery patent. Anterior cerebral arteries widely patent to their distal aspects without stenosis. No M1 occlusion or stenosis. No proximal M2 occlusion. Distal MCA branches well perfused and symmetric. Posterior circulation: Right vertebral artery dominant. Multifocal atheromatous plaque within the V4 segments bilaterally with moderate stenosis on the right and multifocal severe stenoses on the left. Posterior inferior cerebral arteries patent bilaterally. Basilar artery widely patent to its distal aspect. Superior cerebral arteries patent bilaterally. Both of the posterior cerebral arteries primarily supplied via the basilar and are well perfused to their distal aspects. Small bilateral posterior communicating arteries noted. Venous sinuses: Patent. Anatomic variants: None significant.  No aneurysm. Delayed phase: Not performed. Review of the MIP images confirms the above findings IMPRESSION: 1. Negative CTA for emergent  large vessel occlusion. 2. Interval evolution of recently seen intraparenchymal hematoma positioned within the left frontal lobe along the left DBS, with increased regional edema on today's exam without discernible acute blood products. Edema is largely vasogenic in appearance, and favored to be related to the recently identified evolving  hematoma. Despite this, a superimposed small infarct in this region would be extremely difficult to exclude. ASPECTS = 10 3. Mild atherosclerotic change about the carotid bifurcations and carotid siphons without hemodynamically significant or correctable stenosis. 4. Atheromatous plaque at the origin of the vertebral arteries as well as the bilateral V4 segments with resultant moderate stenoses on the right and more severe stenosis on the left. Critical Value/emergent results were called by telephone at the time of interpretation on 05/15/2017 at 7:44 pm to Dr. Otelia Limes, who verbally acknowledged these results. Electronically Signed   By: Rise Mu M.D.   On: 05/15/2017 20:22   Ct Head Code Stroke Wo Contrast  Result Date: 05/15/2017 CLINICAL DATA:  Code stroke. Initial evaluation for acute right-sided weakness, aphasia. EXAM: CT ANGIOGRAPHY HEAD AND NECK TECHNIQUE: Multidetector CT imaging of the head and neck was performed using the standard protocol during bolus administration of intravenous contrast. Multiplanar CT image reconstructions and MIPs were obtained to evaluate the vascular anatomy. Carotid stenosis measurements (when applicable) are obtained utilizing NASCET criteria, using the distal internal carotid diameter as the denominator. CONTRAST:  <See Chart> ISOVUE-370 IOPAMIDOL (ISOVUE-370) INJECTION 76% COMPARISON:  Prior CT from 05/06/2017. FINDINGS: CT HEAD FINDINGS Brain: Bilateral DBS implants in place, tips near the subthalamic nuclei. There has been interval evolution of previously seen parenchymal hematoma along the left DBS, with increased edema now seen within this region, although no acute blood products are now identified (series 3, image 22). A superimposed infarct would be difficult to exclude. No other acute intracranial hemorrhage. No other acute large vessel territory infarct. No mass lesion or midline shift. No hydrocephalus. No discernible extra-axial fluid collection. Few  scattered foci of postoperative pneumocephalus remain, decreased from previous. Vascular: No hyperdense vessel. Skull: Scalp soft tissues and calvarium demonstrate no acute abnormality. DBS implants noted. Sinuses: Globes and orbital soft tissues within normal limits. Patient status post lens extraction bilaterally. Paranasal sinuses and mastoid air cells are clear. Orbits: None. ASPECTS (Alberta Stroke Program Early CT Score) - Ganglionic level infarction (caudate, lentiform nuclei, internal capsule, insula, M1-M3 cortex): 7 - Supraganglionic infarction (M4-M6 cortex): 3 Total score (0-10 with 10 being normal): 10 Review of the MIP images confirms the above findings CTA NECK FINDINGS Aortic arch: Visualized aortic arch of normal caliber with normal branch pattern. 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 No hemodynamically significant stenosis about the origin of the great vessels. Visualized subclavian arteries widely patent. Right carotid system: Right common carotid artery widely patent to the bifurcation. Minimal centric calcified plaque about the right bifurcation without significant stenosis. Right ICA widely patent distally to the skull base without stenosis, dissection, or occlusion. Left carotid system: Left common carotid artery patent from its origin to the bifurcation without stenosis. Mild scattered calcified plaque about the left bifurcation without hemodynamically significant stenosis. Left ICA patent to the skull base without stenosis, dissection, or occlusion. Vertebral arteries: Both of the vertebral arteries arise from the subclavian arteries. Right vertebral artery dominant. Focal plaque at the origin of the vertebral arteries bilaterally with moderate stenosis on the right and more severe stenosis on the left. Vertebral arteries otherwise widely patent within the neck without stenosis, dissection, or occlusion. Skeleton:  No acute osseous abnormality. No worrisome lytic or  blastic osseous lesions. Endplate occipital assimilation noted. 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 Other neck: No acute soft tissue abnormality within the neck. Salivary glands normal. Thyroid normal. No adenopathy. Upper chest: Visualized upper chest demonstrates no acute abnormality. Irregular pleural thickening noted at the anterolateral right upper lobe. Partially visualized lungs are otherwise clear. Review of the MIP images confirms the above findings CTA HEAD FINDINGS Anterior circulation: Petrous segments widely patent bilaterally. Smooth calcified plaque throughout the cavernous ICAs with mild diffuse narrowing. Supraclinoid segments widely patent. ICA termini widely patent. A1 segments patent. Anterior communicating artery patent. Anterior cerebral arteries widely patent to their distal aspects without stenosis. No M1 occlusion or stenosis. No proximal M2 occlusion. Distal MCA branches well perfused and symmetric. Posterior circulation: Right vertebral artery dominant. Multifocal atheromatous plaque within the V4 segments bilaterally with moderate stenosis on the right and multifocal severe stenoses on the left. Posterior inferior cerebral arteries patent bilaterally. Basilar artery widely patent to its distal aspect. Superior cerebral arteries patent bilaterally. Both of the posterior cerebral arteries primarily supplied via the basilar and are well perfused to their distal aspects. Small bilateral posterior communicating arteries noted. Venous sinuses: Patent. Anatomic variants: None significant.  No aneurysm. Delayed phase: Not performed. Review of the MIP images confirms the above findings IMPRESSION: 1. Negative CTA for emergent large vessel occlusion. 2. Interval evolution of recently seen intraparenchymal hematoma positioned within the left frontal lobe along the left DBS, with increased regional edema on today's exam without discernible acute blood products. Edema is largely  vasogenic in appearance, and favored to be related to the recently identified evolving hematoma. Despite this, a superimposed small infarct in this region would be extremely difficult to exclude. ASPECTS = 10 3. Mild atherosclerotic change about the carotid bifurcations and carotid siphons without hemodynamically significant or correctable stenosis. 4. Atheromatous plaque at the origin of the vertebral arteries as well as the bilateral V4 segments with resultant moderate stenoses on the right and more severe stenosis on the left. Critical Value/emergent results were called by telephone at the time of interpretation on 05/15/2017 at 7:44 pm to Dr. Otelia Limes, who verbally acknowledged these results. Electronically Signed   By: Rise Mu M.D.   On: 05/15/2017 20:22    Assessment/Plan: Diagnosis: Left frontal intraparenchymal hemorrhage status post recent deep brain stimulator for tremor 1. Does the need for close, 24 hr/day medical supervision in concert with the patient's rehab needs make it unreasonable for this patient to be served in a less intensive setting? Yes 2. Co-Morbidities requiring supervision/potential complications: History of hypertension, memory loss, CAD s/p CABG, post ICH sequelae 3. Due to bladder management, bowel management, safety, skin/wound care, disease management, medication administration, pain management and patient education, does the patient require 24 hr/day rehab nursing? Yes 4. Does the patient require coordinated care of a physician, rehab nurse, PT (1-2 hrs/day, 5 days/week), OT (1-2 hrs/day, 5 days/week) and SLP (1-2 hrs/day, 5 days/week) to address physical and functional deficits in the context of the above medical diagnosis(es)? Yes Addressing deficits in the following areas: balance, endurance, locomotion, strength, transferring, bowel/bladder control, bathing, dressing, feeding, grooming, toileting, cognition, speech and psychosocial support 5. Can the patient  actively participate in an intensive therapy program of at least 3 hrs of therapy per day at least 5 days per week? Yes 6. The potential for patient to make measurable gains while on inpatient rehab is excellent 7. Anticipated functional outcomes upon discharge  from inpatient rehab are supervision  with PT, supervision and min assist with OT, supervision with SLP. 8. Estimated rehab length of stay to reach the above functional goals is: 8-11 days 9. Anticipated D/C setting: Home 10. Anticipated post D/C treatments: HH therapy and Outpatient therapy 11. Overall Rehab/Functional Prognosis: excellent  RECOMMENDATIONS: This patient's condition is appropriate for continued rehabilitative care in the following setting: CIR Patient has agreed to participate in recommended program. Potentially/yes Note that insurance prior authorization may be required for reimbursement for recommended care.  Comment: Rehab Admissions Coordinator to follow up.  Thanks,  Ranelle OysterZachary T. Blimie Vaness, MD, [place] DomFAAPMR    Mcarthur Rossettianiel J Angiulli, PA-C 05/17/2017

## 2017-05-17 NOTE — Progress Notes (Signed)
Family Medicine Teaching Service Daily Progress Note Intern Pager: [phone]  Patient name: Brendan [name] Medical record number: [id number] Date of birth: 02-21-1941 Age: 77 y.o. Gender: male  Primary Care Provider: [name], MD Consultants: neurology Code Status: full  Pt Overview and Major Events to Date:  1/29 admitted to FPTS, Neurology consulted,  Assessment and Plan: RUE, RLE weakness  Mild aphasia  cognitive slowing CT head negative for ischemic stroke, EEG did not show seizure-like activity. MRI w/ and w/o contrast pending per neurology notes. Leading differential is post-procedure cognitive slowing either expected sequale from procedure or 2/2 vasogenic edema from resolving hematoma. Patient would likely benefit from more intense inpatient rehab and CIR would be more appropriate placement. Will continue to follow neurology and neurosurgery recs. Has had echo performed but still pending read. - Appreciate neurology recommendations - appreciate neurosurgery recs - vital signs per floor routine - gabapentin 300mg  tid - Primidone 50mg  bid - Fall precautions - Up with Assistance - passed bedside swallow, Heart healthy diet - saline lock iv - PT/OT/SLP eval and treat - follow up CIR evaluation - Neuro checks q 4 hours - repeat lipid panel - follow up echo read - ASA 81 - SCDs   Essential tremor S/P DBS placement. Per daughter's report, neurosurgery and neurology teams would like for patient to be closer to baseline prior to placing batteries and starting stimulator. Will continue gabapentin and primidone while here. Management per neurology. - gabapentin 300mg  tid - primidone 50mg  bid - follow up neurology and neurosurgery recs   Hypertension BP of 129/84 on admission. 133/63 at most recent check. Takes cozaar, lasix, propranolol, nitrostat as home meds. No renal problems, will continue all of these home meds as he has been taking them at snf. - continue cozaar  100mg  daily - continue lasix 20mg  daily - continue propranolol 80mg  daily - nitrostat as needed for chest pain  S/P three vessel cabg and abnormal EKG Patient appears to have had a myoview in 2017 which was good. EF 59% at that visit. Does have trace to 1+ pitting edema in lower extremities. No sense or indication that patient is volume overloaded at this time. Nitrostat on board for chest pain.No change in management from outpatient regimen will need to follow up with cardiologist at next scheduled appointment. EKG poor quality but is showing RBBB and LAFB, Anteroseptal infarct, old Nonspecific T abnormalities, lateral leads, Minimal ST elevation, lateral leads - Will trend troponins given reading of minimal ST elevation - Telemetry as above - Repeat EKG if he has any chest pain  Elevated troponin Trop up to 0.04. Will continue to trend. No chest pain. Suspect a little volume overload given 1+ edema in BLE. Can consider lasix if increasing respiratory demands or volume overload increases. - will get echo as has been 18 months since stress test  HLD Last lipid panel 05/01/2017, cholesterol, HDL, LDL w/n/l.  -Can continue home atorvastatin 40mg  daily  GERD -Continue protonix 40mg  daily  Glaucoma  -Continue liquifilm eye drops  Normocytic Anemia: hgb down to 10.3 from 11.2. This appears to be largely dilutional given drop in plt and wbc. Down from 13.4 pre-op on 1/9. Unclear if he had blood loss from his surgery.  - daily CBC - can consider anemia panel in the future  Hypothyroidism TSH of 7.02 on admission. Will check T4, could consider starting low dose synthroid although such a mild elevation unlikely to be causing any of his symptoms.  FEN/GI: heart healthy  PPx: SCDs, protonix  Disposition: snf  Subjective:  No complaints. Wife in room. Tolerating good PO.  Objective: Temp:  [97.8 F (36.6 C)-98.5 F (36.9 C)] 98.3 F (36.8 C) (01/31 0415) Pulse Rate:  [63-89] 79  (01/31 0415) Resp:  [18] 18 (01/31 0415) BP: (129-159)/(50-73) 133/63 (01/31 0415) SpO2:  [96 %-98 %] 98 % (01/31 0415) Physical Exam: General: aox1 (self), elderly obese, caucasian male Cardiovascular: tachycardic, no murmur/rubs/gallops. Trace to 1+ edema in BLE. Respiratory: Lungs CTAB, no chest pain Gastrointestinal: soft, mild distention, no tenderness, obese, bowel sounds present MSK: unchanged from 1/29 RUE: 4/5 strength on flexion and extension of RUE, 5/5 grip strength, some rigidity noted in RUE RLE: 5/5 strength on extension of RLE, 5/5 with plantar flexion and dorsiflexion of R foot, resting tremor noted with activity and at rest, worse with activity Derm: warm and dry. Well healed C shaped incisional scar on left frontal head Neuro: Oriented to self, not place or time. Weakness in RUE as above. Psych: pleasant, appropriate  Laboratory: Recent Labs  Lab 05/15/17 1850 05/15/17 1904 05/16/17 0423  WBC 8.7  --  5.8  HGB 11.1* 11.2* 10.3*  HCT 32.7* 33.0* 30.1*  PLT 246  --  209   Recent Labs  Lab 05/15/17 1850 05/15/17 1904  NA 134* 136  K 3.6 3.6  CL 97* 97*  CO2 24  --   BUN 20 21*  CREATININE 1.33* 1.20  CALCIUM 8.7*  --   PROT 6.3*  --   BILITOT 0.5  --   ALKPHOS 80  --   ALT 43  --   AST 42*  --   GLUCOSE 214* 207*    Imaging/Diagnostic Tests: CLINICAL DATA:  Code stroke. Initial evaluation for acute right-sided weakness, aphasia.  EXAM: CT ANGIOGRAPHY HEAD AND NECK  TECHNIQUE: Multidetector CT imaging of the head and neck was performed using the standard protocol during bolus administration of intravenous contrast. Multiplanar CT image reconstructions and MIPs were obtained to evaluate the vascular anatomy. Carotid stenosis measurements (when applicable) are obtained utilizing NASCET criteria, using the distal internal carotid diameter as the denominator.  CONTRAST:  <See Chart> ISOVUE-370 IOPAMIDOL (ISOVUE-370)  INJECTION 76%  COMPARISON:  Prior CT from 05/06/2017.  FINDINGS: CT HEAD FINDINGS  Brain: Bilateral DBS implants in place, tips near the subthalamic nuclei. There has been interval evolution of previously seen parenchymal hematoma along the left DBS, with increased edema now seen within this region, although no acute blood products are now identified (series 3, image 22). A superimposed infarct would be difficult to exclude.  No other acute intracranial hemorrhage. No other acute large vessel territory infarct. No mass lesion or midline shift. No hydrocephalus. No discernible extra-axial fluid collection. Few scattered foci of postoperative pneumocephalus remain, decreased from previous.  Vascular: No hyperdense vessel.  Skull: Scalp soft tissues and calvarium demonstrate no acute abnormality. DBS implants noted.  Sinuses: Globes and orbital soft tissues within normal limits. Patient status post lens extraction bilaterally. Paranasal sinuses and mastoid air cells are clear.  Orbits: None.  ASPECTS (Alberta Stroke Program Early CT Score)  - Ganglionic level infarction (caudate, lentiform nuclei, internal capsule, insula, M1-M3 cortex): 7  - Supraganglionic infarction (M4-M6 cortex): 3  Total score (0-10 with 10 being normal): 10  Review of the MIP images confirms the above findings  CTA NECK FINDINGS  Aortic arch: Visualized aortic arch of normal caliber with normal branch pattern. 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 No hemodynamically significant stenosis  about the origin of the great vessels. Visualized subclavian arteries widely patent.  Right carotid system: Right common carotid artery widely patent to the bifurcation. Minimal centric calcified plaque about the right bifurcation without significant stenosis. Right ICA widely patent distally to the skull base without stenosis, dissection, or occlusion.  Left carotid  system: Left common carotid artery patent from its origin to the bifurcation without stenosis. Mild scattered calcified plaque about the left bifurcation without hemodynamically significant stenosis. Left ICA patent to the skull base without stenosis, dissection, or occlusion.  Vertebral arteries: Both of the vertebral arteries arise from the subclavian arteries. Right vertebral artery dominant. Focal plaque at the origin of the vertebral arteries bilaterally with moderate stenosis on the right and more severe stenosis on the left. Vertebral arteries otherwise widely patent within the neck without stenosis, dissection, or occlusion.  Skeleton: No acute osseous abnormality. No worrisome lytic or blastic osseous lesions. Endplate occipital assimilation noted. 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  Other neck: No acute soft tissue abnormality within the neck. Salivary glands normal. Thyroid normal. No adenopathy.  Upper chest: Visualized upper chest demonstrates no acute abnormality. Irregular pleural thickening noted at the anterolateral right upper lobe. Partially visualized lungs are otherwise clear.  Review of the MIP images confirms the above findings  CTA HEAD FINDINGS  Anterior circulation: Petrous segments widely patent bilaterally. Smooth calcified plaque throughout the cavernous ICAs with mild diffuse narrowing. Supraclinoid segments widely patent. ICA termini widely patent. A1 segments patent. Anterior communicating artery patent. Anterior cerebral arteries widely patent to their distal aspects without stenosis. No M1 occlusion or stenosis. No proximal M2 occlusion. Distal MCA branches well perfused and symmetric.  Posterior circulation: Right vertebral artery dominant. Multifocal atheromatous plaque within the V4 segments bilaterally with moderate stenosis on the right and multifocal severe stenoses on the left. Posterior inferior cerebral arteries  patent bilaterally. Basilar artery widely patent to its distal aspect. Superior cerebral arteries patent bilaterally. Both of the posterior cerebral arteries primarily supplied via the basilar and are well perfused to their distal aspects. Small bilateral posterior communicating arteries noted.  Venous sinuses: Patent.  Anatomic variants: None significant.  No aneurysm.  Delayed phase: Not performed.  Review of the MIP images confirms the above findings  IMPRESSION: 1. Negative CTA for emergent large vessel occlusion. 2. Interval evolution of recently seen intraparenchymal hematoma positioned within the left frontal lobe along the left DBS, with increased regional edema on today's exam without discernible acute blood products. Edema is largely vasogenic in appearance, and favored to be related to the recently identified evolving hematoma. Despite this, a superimposed small infarct in this region would be extremely difficult to exclude. ASPECTS = 10 3. Mild atherosclerotic change about the carotid bifurcations and carotid siphons without hemodynamically significant or correctable stenosis. 4. Atheromatous plaque at the origin of the vertebral arteries as well as the bilateral V4 segments with resultant moderate stenoses on the right and more severe stenosis on the left.  Critical Value/emergent results were called by telephone at the time of interpretation on 05/15/2017 at 7:44 pm to Dr. Otelia Limes, who verbally acknowledged these results.   Electronically Signed   By: Rise Mu M.D.   On: 05/15/2017 20:22   Myrene Buddy, MD 05/17/2017, 8:10 AM PGY-1, [facility] Family Medicine FPTS Intern pager: [phone], text pages welcome

## 2017-05-17 NOTE — Progress Notes (Signed)
  Speech Language Pathology Treatment: Cognitive-Linquistic  Patient Details Name: Brendan [name] W Brenn MRN: [id number] DOB: 10/19/1940 Today's Date: 05/17/2017 Time: [id number] SLP Time Calculation (min) (ACUTE ONLY): 20 min  Assessment / Plan / Recommendation Clinical Impression  Skilled treatment session focused on communication goals. SLP facilitated session by providing Total A to produce word level utterances. Despite this level of cues, pt with no ability to state wife's name, inability to produce/imitate rote information, answer simple yes/no questions and sustain attention. Pt continues to be able to follow simple directions and comprehend auditory information better than express himself. Continue to recommend Inpatient Rehab as pt will require extensive care should he receive rehab at SNF and would likely not achieve level of independence to return home without intensive rehab program.    HPI HPI: Lillia MountainJerry W Kirkmanis a 77 y.o.malewithPMH is significant forCABG x3, Essential tremor, GERD, glaucoma, HTN, Obesity, Urinary retention, seborrheic dermatitis presentingon 1/29withright sided weaknessand expressive aphasia. The patient's daughter said that she first noticed his right arm becoming weaker on 1/28 to the point to where he could not move that arm at all. Concurrently he would "have trouble getting the proper words out". Patient is currently in a rehab facility ([facility]) after having a deep brain stimulator placed on 1/18 secondary to an essential tremor. Per reports his rehab facility grew concerned with his developing weakness and sent him to the ED in the afternoon of 1/29. In the ED a code stroke was fired. 05/16/17 he underwent CT head and CTA which were negative for large vessel occlusion. These scans did show evolution of a known small hematoma that the patient developed after his deep brain stimulator. Per the read it is felt that the hematoma has been resorbed with underlying vasogenic  edema filling that space. As mentioned above the patient had a DBS placed by Dr. Venetia MaxonStern on 05/04/17 for tremors. Per reports from the patient's daughter, since having the DBS placed he has had some cognitive slowing, mild rigidity, and a drop in his baseline mental status. From the informed consent this was felt to be a normal acute sequale from the procedure. Neurosurgery and Neurology teams were waiting for patient to return closer to baseline before placing batteries and turning device on. Although a little unclear it appears that the patient was to be on gabapentin and primidone in the meantime.       SLP Plan  Continue with current plan of care       Recommendations                   General recommendations: Rehab consult Oral Care Recommendations: Oral care BID Follow up Recommendations: Inpatient Rehab SLP Visit Diagnosis: Aphasia (R47.01) Plan: Continue with current plan of care       GO                Torben Soloway 05/17/2017, 2:58 PM

## 2017-05-17 NOTE — Progress Notes (Signed)
Physical Therapy Treatment Patient Details Name: Brendan [name] MRN: [id number] DOB: 1940-12-23 Today's Date: 05/17/2017    History of Present Illness 77yo male with 12-24 hours of R sided weakness, R UE much weaker than R LE. of note, he had a deep brain stimulator placed on 05/04/17 and had been receiving therapy at a rehabilitation facility PTA. CT negative for large bleed, per imaging notes all appears normal following deep brain stimulator placement. PMH anxiety, cervical stenosis, CPRS L UE, hx CABG x3, gait disorder, glaucoma, obesity, peripheral neuropathy, hx heart cath, hx knee surgery, deep brain stimulator placed 05/04/17.     PT Comments    Patient received in bed today, doing well and pleasant, willing to participate with PT this morning. He continues to require ModA for functional bed mobility and is able to generally hold himself at midline sitting with B UE support, but today did have posterior LOB requiring PT assist to correct. Able to perform functional transfers with MinA, R UE remains very weak and he requires assistance placing his R hand on the walker. Gait trained approximately 64f this morning noting very fast, short shuffling pattern and unsafe use of assistive device requiring MinA and repeated cues to correct/to maintain safety with gait especially when navigating obstacles. Mild R inattention noted throughout session. He was left up in chair with all needs met, chair alarm activated and wife present in room this morning.    Follow Up Recommendations  CIR     Equipment Recommendations  None recommended by PT(defer to next venue )    Recommendations for Other Services Rehab consult     Precautions / Restrictions Precautions Precautions: Fall Restrictions Weight Bearing Restrictions: No    Mobility  Bed Mobility Overal bed mobility: Needs Assistance Bed Mobility: Supine to Sit     Supine to sit: Mod assist     General bed mobility comments: needs help  powering up trunk/pivoting to EOB safely due to sequencing difficulty; generally able to maintain midline sitting at EOB with B UE support but did have LOB posteriorly today   Transfers Overall transfer level: Needs assistance Equipment used: Rolling walker (2 wheeled) Transfers: Sit to/from Stand Sit to Stand: Min assist         General transfer comment: VC/TC for technique   Ambulation/Gait Ambulation/Gait assistance: Min assist Ambulation Distance (Feet): 80 Feet Assistive device: Rolling walker (2 wheeled) Gait Pattern/deviations: Step-through pattern;Decreased step length - right;Decreased step length - left;Decreased dorsiflexion - right;Decreased dorsiflexion - left;Shuffle;Trunk flexed;Drifts right/left     General Gait Details: short, shuffling steps and tends to push RW far in front of him, requires cues and MinA for safety with assistive device especially when navigating obstacles    Stairs            Wheelchair Mobility    Modified Rankin (Stroke Patients Only)       Balance Overall balance assessment: Needs assistance Sitting-balance support: Feet supported;Bilateral upper extremity supported Sitting balance-Leahy Scale: Fair Sitting balance - Comments: one time posterior LOB while seated EOB today    Standing balance support: Bilateral upper extremity supported;During functional activity Standing balance-Leahy Scale: Fair Standing balance comment: reliant on UE support on walker                             Cognition Arousal/Alertness: Awake/alert Behavior During Therapy: WFL for tasks assessed/performed;Flat affect Overall Cognitive Status: Impaired/Different from baseline Area of Impairment: Safety/judgement;Problem solving  Memory: Decreased recall of precautions;Decreased short-term memory Following Commands: Follows one step commands consistently;Follows one step commands with increased time     Problem  Solving: Slow processing;Decreased initiation;Difficulty sequencing;Requires verbal cues;Requires tactile cues        Exercises      General Comments General comments (skin integrity, edema, etc.): rehab MD present during session/aware of patient's physical performance       Pertinent Vitals/Pain Pain Assessment: No/denies pain Pain Score: 0-No pain Pain Intervention(s): Limited activity within patient's tolerance;Monitored during session    Home Living                      Prior Function            PT Goals (current goals can now be found in the care plan section) Acute Rehab PT Goals Patient Stated Goal: to get better  PT Goal Formulation: With patient Time For Goal Achievement: 05/23/17 Potential to Achieve Goals: Good Progress towards PT goals: Progressing toward goals    Frequency    Min 3X/week      PT Plan Discharge plan needs to be updated    Co-evaluation              AM-PAC PT "6 Clicks" Daily Activity  Outcome Measure  Difficulty turning over in bed (including adjusting bedclothes, sheets and blankets)?: Unable Difficulty moving from lying on back to sitting on the side of the bed? : Unable Difficulty sitting down on and standing up from a chair with arms (e.g., wheelchair, bedside commode, etc,.)?: Unable Help needed moving to and from a bed to chair (including a wheelchair)?: A Little Help needed walking in hospital room?: A Little Help needed climbing 3-5 steps with a railing? : A Lot 6 Click Score: 11    End of Session Equipment Utilized During Treatment: Gait belt Activity Tolerance: Patient tolerated treatment well Patient left: in chair;with chair alarm set;with call bell/phone within reach   PT Visit Diagnosis: Unsteadiness on feet (R26.81);Other abnormalities of gait and mobility (R26.89);Apraxia (R48.2);Other symptoms and signs involving the nervous system (R29.898);Muscle weakness (generalized) (M62.81)     Time:  8182-9937 PT Time Calculation (min) (ACUTE ONLY): 23 min  Charges:  $Gait Training: 8-22 mins $Therapeutic Activity: 8-22 mins                    G Codes:       Deniece Ree PT, DPT, CBIS  Supplemental Physical Therapist [place]   Pager [phone]

## 2017-05-17 NOTE — Progress Notes (Addendum)
Subjective: Patient reports "Brendan [name]", spoken slowly and with some difficulty.   Objective: Vital signs in last 24 hours: Temp:  [97.8 F (36.6 C)-98.5 F (36.9 C)] 98.3 F (36.8 C) (01/31 0415) Pulse Rate:  [63-79] 66 (01/31 0914) Resp:  [18] 18 (01/31 0415) BP: (129-159)/(50-73) 131/52 (01/31 0914) SpO2:  [96 %-98 %] 98 % (01/31 0415)  Intake/Output from previous day: 01/30 0701 - 01/31 0700 In: -  Out: 1000 [Urine:1000] Intake/Output this shift: No intake/output data recorded.  Sleepy, but awakens and coperates with exam. States his name today with some difficulty. Little change overall. RUE weakness persists. PEARL.   Lab Results: Recent Labs    05/15/17 1850 05/15/17 1904 05/16/17 0423  WBC 8.7  --  5.8  HGB 11.1* 11.2* 10.3*  HCT 32.7* 33.0* 30.1*  PLT 246  --  209   BMET Recent Labs    05/15/17 1850 05/15/17 1904  NA 134* 136  K 3.6 3.6  CL 97* 97*  CO2 24  --   GLUCOSE 214* 207*  BUN 20 21*  CREATININE 1.33* 1.20  CALCIUM 8.7*  --     Studies/Results: Ct Angio Head W Or Wo Contrast  Result Date: 05/15/2017 CLINICAL DATA:  Code stroke. Initial evaluation for acute right-sided weakness, aphasia. EXAM: CT ANGIOGRAPHY HEAD AND NECK TECHNIQUE: Multidetector CT imaging of the head and neck was performed using the standard protocol during bolus administration of intravenous contrast. Multiplanar CT image reconstructions and MIPs were obtained to evaluate the vascular anatomy. Carotid stenosis measurements (when applicable) are obtained utilizing NASCET criteria, using the distal internal carotid diameter as the denominator. CONTRAST:  <See Chart> ISOVUE-370 IOPAMIDOL (ISOVUE-370) INJECTION 76% COMPARISON:  Prior CT from 05/06/2017. FINDINGS: CT HEAD FINDINGS Brain: Bilateral DBS implants in place, tips near the subthalamic nuclei. There has been interval evolution of previously seen parenchymal hematoma along the left DBS, with increased edema now seen  within this region, although no acute blood products are now identified (series 3, image 22). A superimposed infarct would be difficult to exclude. No other acute intracranial hemorrhage. No other acute large vessel territory infarct. No mass lesion or midline shift. No hydrocephalus. No discernible extra-axial fluid collection. Few scattered foci of postoperative pneumocephalus remain, decreased from previous. Vascular: No hyperdense vessel. Skull: Scalp soft tissues and calvarium demonstrate no acute abnormality. DBS implants noted. Sinuses: Globes and orbital soft tissues within normal limits. Patient status post lens extraction bilaterally. Paranasal sinuses and mastoid air cells are clear. Orbits: None. ASPECTS ([place]berta Stroke Program Early CT Score) - Ganglionic level infarction (caudate, lentiform nuclei, internal capsule, insula, M1-M3 cortex): 7 - Supraganglionic infarction (M4-M6 cortex): 3 Total score (0-10 with 10 being normal): 10 Review of the MIP images confirms the above findings CTA NECK FINDINGS Aortic arch: Visualized aortic arch of normal caliber with normal branch pattern. 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 No hemodynamically significant stenosis about the origin of the great vessels. Visualized subclavian arteries widely patent. Right carotid system: Right common carotid artery widely patent to the bifurcation. Minimal centric calcified plaque about the right bifurcation without significant stenosis. Right ICA widely patent distally to the skull base without stenosis, dissection, or occlusion. Left carotid system: Left common carotid artery patent from its origin to the bifurcation without stenosis. Mild scattered calcified plaque about the left bifurcation without hemodynamically significant stenosis. Left ICA patent to the skull base without stenosis, dissection, or occlusion. Vertebral arteries: Both of the vertebral arteries arise from the subclavian arteries.  Right  vertebral artery dominant. Focal plaque at the origin of the vertebral arteries bilaterally with moderate stenosis on the right and more severe stenosis on the left. Vertebral arteries otherwise widely patent within the neck without stenosis, dissection, or occlusion. Skeleton: No acute osseous abnormality. No worrisome lytic or blastic osseous lesions. Endplate occipital assimilation noted. 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 Other neck: No acute soft tissue abnormality within the neck. Salivary glands normal. Thyroid normal. No adenopathy. Upper chest: Visualized upper chest demonstrates no acute abnormality. Irregular pleural thickening noted at the anterolateral right upper lobe. Partially visualized lungs are otherwise clear. Review of the MIP images confirms the above findings CTA HEAD FINDINGS Anterior circulation: Petrous segments widely patent bilaterally. Smooth calcified plaque throughout the cavernous ICAs with mild diffuse narrowing. Supraclinoid segments widely patent. ICA termini widely patent. A1 segments patent. Anterior communicating artery patent. Anterior cerebral arteries widely patent to their distal aspects without stenosis. No M1 occlusion or stenosis. No proximal M2 occlusion. Distal MCA branches well perfused and symmetric. Posterior circulation: Right vertebral artery dominant. Multifocal atheromatous plaque within the V4 segments bilaterally with moderate stenosis on the right and multifocal severe stenoses on the left. Posterior inferior cerebral arteries patent bilaterally. Basilar artery widely patent to its distal aspect. Superior cerebral arteries patent bilaterally. Both of the posterior cerebral arteries primarily supplied via the basilar and are well perfused to their distal aspects. Small bilateral posterior communicating arteries noted. Venous sinuses: Patent. Anatomic variants: None significant.  No aneurysm. Delayed phase: Not performed. Review of the MIP images  confirms the above findings IMPRESSION: 1. Negative CTA for emergent large vessel occlusion. 2. Interval evolution of recently seen intraparenchymal hematoma positioned within the left frontal lobe along the left DBS, with increased regional edema on today's exam without discernible acute blood products. Edema is largely vasogenic in appearance, and favored to be related to the recently identified evolving hematoma. Despite this, a superimposed small infarct in this region would be extremely difficult to exclude. ASPECTS = 10 3. Mild atherosclerotic change about the carotid bifurcations and carotid siphons without hemodynamically significant or correctable stenosis. 4. Atheromatous plaque at the origin of the vertebral arteries as well as the bilateral V4 segments with resultant moderate stenoses on the right and more severe stenosis on the left. Critical Value/emergent results were called by telephone at the time of interpretation on 05/15/2017 at 7:44 pm to Dr. Otelia LimesLindzen, who verbally acknowledged these results. Electronically Signed   By: Rise MuBenjamin  McClintock M.D.   On: 05/15/2017 20:22   Ct Angio Neck W Or Wo Contrast  Result Date: 05/15/2017 CLINICAL DATA:  Code stroke. Initial evaluation for acute right-sided weakness, aphasia. EXAM: CT ANGIOGRAPHY HEAD AND NECK TECHNIQUE: Multidetector CT imaging of the head and neck was performed using the standard protocol during bolus administration of intravenous contrast. Multiplanar CT image reconstructions and MIPs were obtained to evaluate the vascular anatomy. Carotid stenosis measurements (when applicable) are obtained utilizing NASCET criteria, using the distal internal carotid diameter as the denominator. CONTRAST:  <See Chart> ISOVUE-370 IOPAMIDOL (ISOVUE-370) INJECTION 76% COMPARISON:  Prior CT from 05/06/2017. FINDINGS: CT HEAD FINDINGS Brain: Bilateral DBS implants in place, tips near the subthalamic nuclei. There has been interval evolution of previously  seen parenchymal hematoma along the left DBS, with increased edema now seen within this region, although no acute blood products are now identified (series 3, image 22). A superimposed infarct would be difficult to exclude. No other acute intracranial hemorrhage. No other acute large vessel  territory infarct. No mass lesion or midline shift. No hydrocephalus. No discernible extra-axial fluid collection. Few scattered foci of postoperative pneumocephalus remain, decreased from previous. Vascular: No hyperdense vessel. Skull: Scalp soft tissues and calvarium demonstrate no acute abnormality. DBS implants noted. Sinuses: Globes and orbital soft tissues within normal limits. Patient status post lens extraction bilaterally. Paranasal sinuses and mastoid air cells are clear. Orbits: None. ASPECTS ([place]berta Stroke Program Early CT Score) - Ganglionic level infarction (caudate, lentiform nuclei, internal capsule, insula, M1-M3 cortex): 7 - Supraganglionic infarction (M4-M6 cortex): 3 Total score (0-10 with 10 being normal): 10 Review of the MIP images confirms the above findings CTA NECK FINDINGS Aortic arch: Visualized aortic arch of normal caliber with normal branch pattern. 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 No hemodynamically significant stenosis about the origin of the great vessels. Visualized subclavian arteries widely patent. Right carotid system: Right common carotid artery widely patent to the bifurcation. Minimal centric calcified plaque about the right bifurcation without significant stenosis. Right ICA widely patent distally to the skull base without stenosis, dissection, or occlusion. Left carotid system: Left common carotid artery patent from its origin to the bifurcation without stenosis. Mild scattered calcified plaque about the left bifurcation without hemodynamically significant stenosis. Left ICA patent to the skull base without stenosis, dissection, or occlusion. Vertebral  arteries: Both of the vertebral arteries arise from the subclavian arteries. Right vertebral artery dominant. Focal plaque at the origin of the vertebral arteries bilaterally with moderate stenosis on the right and more severe stenosis on the left. Vertebral arteries otherwise widely patent within the neck without stenosis, dissection, or occlusion. Skeleton: No acute osseous abnormality. No worrisome lytic or blastic osseous lesions. Endplate occipital assimilation noted. 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 Other neck: No acute soft tissue abnormality within the neck. Salivary glands normal. Thyroid normal. No adenopathy. Upper chest: Visualized upper chest demonstrates no acute abnormality. Irregular pleural thickening noted at the anterolateral right upper lobe. Partially visualized lungs are otherwise clear. Review of the MIP images confirms the above findings CTA HEAD FINDINGS Anterior circulation: Petrous segments widely patent bilaterally. Smooth calcified plaque throughout the cavernous ICAs with mild diffuse narrowing. Supraclinoid segments widely patent. ICA termini widely patent. A1 segments patent. Anterior communicating artery patent. Anterior cerebral arteries widely patent to their distal aspects without stenosis. No M1 occlusion or stenosis. No proximal M2 occlusion. Distal MCA branches well perfused and symmetric. Posterior circulation: Right vertebral artery dominant. Multifocal atheromatous plaque within the V4 segments bilaterally with moderate stenosis on the right and multifocal severe stenoses on the left. Posterior inferior cerebral arteries patent bilaterally. Basilar artery widely patent to its distal aspect. Superior cerebral arteries patent bilaterally. Both of the posterior cerebral arteries primarily supplied via the basilar and are well perfused to their distal aspects. Small bilateral posterior communicating arteries noted. Venous sinuses: Patent. Anatomic variants:  None significant.  No aneurysm. Delayed phase: Not performed. Review of the MIP images confirms the above findings IMPRESSION: 1. Negative CTA for emergent large vessel occlusion. 2. Interval evolution of recently seen intraparenchymal hematoma positioned within the left frontal lobe along the left DBS, with increased regional edema on today's exam without discernible acute blood products. Edema is largely vasogenic in appearance, and favored to be related to the recently identified evolving hematoma. Despite this, a superimposed small infarct in this region would be extremely difficult to exclude. ASPECTS = 10 3. Mild atherosclerotic change about the carotid bifurcations and carotid siphons without hemodynamically significant or correctable stenosis.  4. Atheromatous plaque at the origin of the vertebral arteries as well as the bilateral V4 segments with resultant moderate stenoses on the right and more severe stenosis on the left. Critical Value/emergent results were called by telephone at the time of interpretation on 05/15/2017 at 7:44 pm to Dr. Otelia Limes, who verbally acknowledged these results. Electronically Signed   By: Rise Mu M.D.   On: 05/15/2017 20:22   Mr Laqueta Jean FU Contrast  Result Date: 05/17/2017 CLINICAL DATA:  Acute presentation with right-sided weakness and right-sided facial droop. Previous thalamic stimulators. EXAM: MRI HEAD WITHOUT AND WITH CONTRAST TECHNIQUE: Multiplanar, multiecho pulse sequences of the brain and surrounding structures were obtained without and with intravenous contrast. CONTRAST:  18mL MULTIHANCE GADOBENATE DIMEGLUMINE 529 MG/ML IV SOLN COMPARISON:  CT 05/15/2017 FINDINGS: Brain: DBS protocol performed. Diffusion imaging not available in this setting. 4.5 x 3.1 by approximately 3 cm fluid collection within the left frontoparietal brain, surrounding the deep brain stimulator, with a fluid level and surrounding edema. Thin marginal enhancement. This is presumed  to represent a postoperative hematoma. It is not possible on this examination to exclude an abscess. On the right, there is mild brain edema along the stimulator without evidence fluid collection. There is no hydrocephalus. No extra-axial collection. Vascular: Major vessels at the base of the brain show flow. Skull and upper cervical spine: No bone abnormality seen. Sinuses/Orbits: Negative Other: None IMPRESSION: 4.5 x 3.1 by approximately 3 cm fluid collection surrounding the left deep brain stimulator electrode in the frontoparietal brain, with thin marginal enhancement and pronounced surrounding brain edema. Fluid level seen internally. This has enlarged when compared to the postprocedure exam of 05/06/2017, when the maximal diameter was approximately 3.5 cm. This is presumed to represent a postoperative hematoma undergoing evolution. It is not possible for me to exclude the possibility of infection, but I do not strongly favor that based on these imaging characteristics. Mild edema surrounding the right deep brain stimulator electrode. Electronically Signed   By: Paulina Fusi M.D.   On: 05/17/2017 08:53   Ct Head Code Stroke Wo Contrast  Result Date: 05/15/2017 CLINICAL DATA:  Code stroke. Initial evaluation for acute right-sided weakness, aphasia. EXAM: CT ANGIOGRAPHY HEAD AND NECK TECHNIQUE: Multidetector CT imaging of the head and neck was performed using the standard protocol during bolus administration of intravenous contrast. Multiplanar CT image reconstructions and MIPs were obtained to evaluate the vascular anatomy. Carotid stenosis measurements (when applicable) are obtained utilizing NASCET criteria, using the distal internal carotid diameter as the denominator. CONTRAST:  <See Chart> ISOVUE-370 IOPAMIDOL (ISOVUE-370) INJECTION 76% COMPARISON:  Prior CT from 05/06/2017. FINDINGS: CT HEAD FINDINGS Brain: Bilateral DBS implants in place, tips near the subthalamic nuclei. There has been interval  evolution of previously seen parenchymal hematoma along the left DBS, with increased edema now seen within this region, although no acute blood products are now identified (series 3, image 22). A superimposed infarct would be difficult to exclude. No other acute intracranial hemorrhage. No other acute large vessel territory infarct. No mass lesion or midline shift. No hydrocephalus. No discernible extra-axial fluid collection. Few scattered foci of postoperative pneumocephalus remain, decreased from previous. Vascular: No hyperdense vessel. Skull: Scalp soft tissues and calvarium demonstrate no acute abnormality. DBS implants noted. Sinuses: Globes and orbital soft tissues within normal limits. Patient status post lens extraction bilaterally. Paranasal sinuses and mastoid air cells are clear. Orbits: None. ASPECTS [facility] Stroke Program Early CT Score) - Ganglionic level infarction (caudate, lentiform nuclei, internal  capsule, insula, M1-M3 cortex): 7 - Supraganglionic infarction (M4-M6 cortex): 3 Total score (0-10 with 10 being normal): 10 Review of the MIP images confirms the above findings CTA NECK FINDINGS Aortic arch: Visualized aortic arch of normal caliber with normal branch pattern. 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 No hemodynamically significant stenosis about the origin of the great vessels. Visualized subclavian arteries widely patent. Right carotid system: Right common carotid artery widely patent to the bifurcation. Minimal centric calcified plaque about the right bifurcation without significant stenosis. Right ICA widely patent distally to the skull base without stenosis, dissection, or occlusion. Left carotid system: Left common carotid artery patent from its origin to the bifurcation without stenosis. Mild scattered calcified plaque about the left bifurcation without hemodynamically significant stenosis. Left ICA patent to the skull base without stenosis, dissection, or  occlusion. Vertebral arteries: Both of the vertebral arteries arise from the subclavian arteries. Right vertebral artery dominant. Focal plaque at the origin of the vertebral arteries bilaterally with moderate stenosis on the right and more severe stenosis on the left. Vertebral arteries otherwise widely patent within the neck without stenosis, dissection, or occlusion. Skeleton: No acute osseous abnormality. No worrisome lytic or blastic osseous lesions. Endplate occipital assimilation noted. 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 Other neck: No acute soft tissue abnormality within the neck. Salivary glands normal. Thyroid normal. No adenopathy. Upper chest: Visualized upper chest demonstrates no acute abnormality. Irregular pleural thickening noted at the anterolateral right upper lobe. Partially visualized lungs are otherwise clear. Review of the MIP images confirms the above findings CTA HEAD FINDINGS Anterior circulation: Petrous segments widely patent bilaterally. Smooth calcified plaque throughout the cavernous ICAs with mild diffuse narrowing. Supraclinoid segments widely patent. ICA termini widely patent. A1 segments patent. Anterior communicating artery patent. Anterior cerebral arteries widely patent to their distal aspects without stenosis. No M1 occlusion or stenosis. No proximal M2 occlusion. Distal MCA branches well perfused and symmetric. Posterior circulation: Right vertebral artery dominant. Multifocal atheromatous plaque within the V4 segments bilaterally with moderate stenosis on the right and multifocal severe stenoses on the left. Posterior inferior cerebral arteries patent bilaterally. Basilar artery widely patent to its distal aspect. Superior cerebral arteries patent bilaterally. Both of the posterior cerebral arteries primarily supplied via the basilar and are well perfused to their distal aspects. Small bilateral posterior communicating arteries noted. Venous sinuses: Patent.  Anatomic variants: None significant.  No aneurysm. Delayed phase: Not performed. Review of the MIP images confirms the above findings IMPRESSION: 1. Negative CTA for emergent large vessel occlusion. 2. Interval evolution of recently seen intraparenchymal hematoma positioned within the left frontal lobe along the left DBS, with increased regional edema on today's exam without discernible acute blood products. Edema is largely vasogenic in appearance, and favored to be related to the recently identified evolving hematoma. Despite this, a superimposed small infarct in this region would be extremely difficult to exclude. ASPECTS = 10 3. Mild atherosclerotic change about the carotid bifurcations and carotid siphons without hemodynamically significant or correctable stenosis. 4. Atheromatous plaque at the origin of the vertebral arteries as well as the bilateral V4 segments with resultant moderate stenoses on the right and more severe stenosis on the left. Critical Value/emergent results were called by telephone at the time of interpretation on 05/15/2017 at 7:44 pm to Dr. Otelia Limes, who verbally acknowledged these results. Electronically Signed   By: Rise Mu M.D.   On: 05/15/2017 20:22    Assessment/Plan:   LOS: 2 days  continue PT/OT/ST.  Hopeful of CIR. DrStern will visit after surgery this afternoon. Spoke with wife present and daughter on phone.    Georgiann Cocker 05/17/2017, 12:41 PM   Repeat MRI shows evolution of hematoma.  He needs Rehab and will likely improve with time.

## 2017-05-18 ENCOUNTER — Inpatient Hospital Stay (HOSPITAL_COMMUNITY)
Admission: RE | Admit: 2017-05-18 | Discharge: 2017-06-09 | DRG: 057 | Disposition: A | Discharge status: Home Health | Payer: PPO | Source: Intra-hospital | Attending: Physical Medicine & Rehabilitation | Admitting: Physical Medicine & Rehabilitation

## 2017-05-18 DIAGNOSIS — R451 Restlessness and agitation: Secondary | ICD-10-CM | POA: Diagnosis not present

## 2017-05-18 DIAGNOSIS — R410 Disorientation, unspecified: Secondary | ICD-10-CM | POA: Diagnosis not present

## 2017-05-18 DIAGNOSIS — Z9109 Other allergy status, other than to drugs and biological substances: Secondary | ICD-10-CM | POA: Diagnosis not present

## 2017-05-18 DIAGNOSIS — G479 Sleep disorder, unspecified: Secondary | ICD-10-CM | POA: Diagnosis not present

## 2017-05-18 DIAGNOSIS — I10 Essential (primary) hypertension: Secondary | ICD-10-CM

## 2017-05-18 DIAGNOSIS — Z955 Presence of coronary angioplasty implant and graft: Secondary | ICD-10-CM | POA: Diagnosis not present

## 2017-05-18 DIAGNOSIS — N390 Urinary tract infection, site not specified: Secondary | ICD-10-CM | POA: Diagnosis not present

## 2017-05-18 DIAGNOSIS — E861 Hypovolemia: Secondary | ICD-10-CM | POA: Diagnosis not present

## 2017-05-18 DIAGNOSIS — Z87891 Personal history of nicotine dependence: Secondary | ICD-10-CM

## 2017-05-18 DIAGNOSIS — R0989 Other specified symptoms and signs involving the circulatory and respiratory systems: Secondary | ICD-10-CM | POA: Diagnosis not present

## 2017-05-18 DIAGNOSIS — Z79899 Other long term (current) drug therapy: Secondary | ICD-10-CM

## 2017-05-18 DIAGNOSIS — I69321 Dysphasia following cerebral infarction: Secondary | ICD-10-CM | POA: Diagnosis not present

## 2017-05-18 DIAGNOSIS — R7309 Other abnormal glucose: Secondary | ICD-10-CM | POA: Diagnosis not present

## 2017-05-18 DIAGNOSIS — R251 Tremor, unspecified: Secondary | ICD-10-CM | POA: Diagnosis not present

## 2017-05-18 DIAGNOSIS — Z888 Allergy status to other drugs, medicaments and biological substances status: Secondary | ICD-10-CM

## 2017-05-18 DIAGNOSIS — I952 Hypotension due to drugs: Secondary | ICD-10-CM | POA: Diagnosis not present

## 2017-05-18 DIAGNOSIS — D62 Acute posthemorrhagic anemia: Secondary | ICD-10-CM

## 2017-05-18 DIAGNOSIS — I9589 Other hypotension: Secondary | ICD-10-CM | POA: Diagnosis not present

## 2017-05-18 DIAGNOSIS — B952 Enterococcus as the cause of diseases classified elsewhere: Secondary | ICD-10-CM | POA: Diagnosis not present

## 2017-05-18 DIAGNOSIS — N319 Neuromuscular dysfunction of bladder, unspecified: Secondary | ICD-10-CM | POA: Diagnosis not present

## 2017-05-18 DIAGNOSIS — G8190 Hemiplegia, unspecified affecting unspecified side: Secondary | ICD-10-CM

## 2017-05-18 DIAGNOSIS — I69193 Ataxia following nontraumatic intracerebral hemorrhage: Secondary | ICD-10-CM | POA: Diagnosis not present

## 2017-05-18 DIAGNOSIS — E785 Hyperlipidemia, unspecified: Secondary | ICD-10-CM | POA: Diagnosis present

## 2017-05-18 DIAGNOSIS — I619 Nontraumatic intracerebral hemorrhage, unspecified: Secondary | ICD-10-CM | POA: Diagnosis not present

## 2017-05-18 DIAGNOSIS — I69851 Hemiplegia and hemiparesis following other cerebrovascular disease affecting right dominant side: Secondary | ICD-10-CM | POA: Diagnosis not present

## 2017-05-18 DIAGNOSIS — R05 Cough: Secondary | ICD-10-CM | POA: Diagnosis not present

## 2017-05-18 DIAGNOSIS — G8110 Spastic hemiplegia affecting unspecified side: Secondary | ICD-10-CM

## 2017-05-18 DIAGNOSIS — I69151 Hemiplegia and hemiparesis following nontraumatic intracerebral hemorrhage affecting right dominant side: Secondary | ICD-10-CM | POA: Diagnosis not present

## 2017-05-18 DIAGNOSIS — E119 Type 2 diabetes mellitus without complications: Secondary | ICD-10-CM

## 2017-05-18 DIAGNOSIS — N179 Acute kidney failure, unspecified: Secondary | ICD-10-CM

## 2017-05-18 DIAGNOSIS — I25810 Atherosclerosis of coronary artery bypass graft(s) without angina pectoris: Secondary | ICD-10-CM | POA: Diagnosis present

## 2017-05-18 DIAGNOSIS — R1310 Dysphagia, unspecified: Secondary | ICD-10-CM | POA: Diagnosis not present

## 2017-05-18 DIAGNOSIS — Z9689 Presence of other specified functional implants: Secondary | ICD-10-CM | POA: Diagnosis present

## 2017-05-18 DIAGNOSIS — G8111 Spastic hemiplegia affecting right dominant side: Secondary | ICD-10-CM | POA: Diagnosis not present

## 2017-05-18 DIAGNOSIS — R42 Dizziness and giddiness: Secondary | ICD-10-CM

## 2017-05-18 DIAGNOSIS — R4701 Aphasia: Secondary | ICD-10-CM | POA: Diagnosis not present

## 2017-05-18 DIAGNOSIS — R058 Other specified cough: Secondary | ICD-10-CM

## 2017-05-18 DIAGNOSIS — R7303 Prediabetes: Secondary | ICD-10-CM | POA: Diagnosis not present

## 2017-05-18 DIAGNOSIS — A499 Bacterial infection, unspecified: Secondary | ICD-10-CM | POA: Diagnosis not present

## 2017-05-18 DIAGNOSIS — D696 Thrombocytopenia, unspecified: Secondary | ICD-10-CM

## 2017-05-18 DIAGNOSIS — Z951 Presence of aortocoronary bypass graft: Secondary | ICD-10-CM

## 2017-05-18 LAB — BASIC METABOLIC PANEL
Anion gap: 9 (ref 5–15)
Anion gap: 11 (ref 5–15)
BUN: 11 mg/dL (ref 6–20)
BUN: 9 mg/dL (ref 6–20)
CO2: 25 mmol/L (ref 22–32)
CO2: 25 mmol/L (ref 22–32)
Calcium: 8.5 mg/dL — ABNORMAL LOW (ref 8.9–10.3)
Calcium: 8.7 mg/dL — ABNORMAL LOW (ref 8.9–10.3)
Chloride: 102 mmol/L (ref 101–111)
Chloride: 100 mmol/L — ABNORMAL LOW (ref 101–111)
Creatinine, Ser: 0.96 mg/dL (ref 0.61–1.24)
Creatinine, Ser: 0.81 mg/dL (ref 0.61–1.24)
GFR calc Af Amer: >60 mL/min (ref >60)
GFR calc Af Amer: >60 mL/min (ref >60)
GFR calc non Af Amer: >60 mL/min (ref >60)
GFR calc non Af Amer: >60 mL/min (ref >60)
Glucose, Bld: 238 mg/dL — ABNORMAL HIGH (ref 65–99)
Glucose, Bld: 162 mg/dL — ABNORMAL HIGH (ref 65–99)
Potassium: 3.7 mmol/L (ref 3.5–5.1)
Potassium: 4.1 mmol/L (ref 3.5–5.1)
Sodium: 136 mmol/L (ref 135–145)
Sodium: 136 mmol/L (ref 135–145)

## 2017-05-18 MED ORDER — SENNOSIDES-DOCUSATE SODIUM 8.6-50 MG PO TABS: 1.0000 | ORAL_TABLET | Freq: Every evening | ORAL | Status: DC | PRN

## 2017-05-18 MED ORDER — ACETAMINOPHEN 325 MG PO TABS: 325.0000 mg | ORAL_TABLET | ORAL | Status: DC | PRN

## 2017-05-18 MED ORDER — ASPIRIN 81 MG PO CHEW
81.0000 mg | CHEWABLE_TABLET | Freq: Every day | ORAL | Status: DC
  Administered 2017-05-19 – 2017-06-09 (×22): 81 mg via ORAL
  Filled 2017-05-18 (×22): qty 1

## 2017-05-18 MED ORDER — ATORVASTATIN CALCIUM 40 MG PO TABS: 40.0000 mg | ORAL_TABLET | Freq: Every day | ORAL | Status: DC

## 2017-05-18 MED ORDER — ONDANSETRON HCL 4 MG PO TABS
4.0000 mg | ORAL_TABLET | Freq: Four times a day (QID) | ORAL | Status: DC | PRN
  Administered 2017-06-07: 4 mg via ORAL
  Filled 2017-05-18: qty 1

## 2017-05-18 MED ORDER — PROCHLORPERAZINE MALEATE 5 MG PO TABS: 5.0000 mg | ORAL_TABLET | Freq: Four times a day (QID) | ORAL | Status: DC | PRN

## 2017-05-18 MED ORDER — PRIMIDONE 50 MG PO TABS: 50.0000 mg | ORAL_TABLET | Freq: Two times a day (BID) | ORAL | 0 refills | Status: DC

## 2017-05-18 MED ORDER — DOCUSATE SODIUM 100 MG PO CAPS
100.0000 mg | ORAL_CAPSULE | Freq: Two times a day (BID) | ORAL | Status: DC
  Administered 2017-05-21 – 2017-06-09 (×28): 100 mg via ORAL
  Filled 2017-05-18 (×41): qty 1

## 2017-05-18 MED ORDER — BISACODYL 10 MG RE SUPP: 10.0000 mg | Freq: Every day | RECTAL | Status: DC | PRN

## 2017-05-18 MED ORDER — PROCHLORPERAZINE 25 MG RE SUPP: 12.5000 mg | Freq: Four times a day (QID) | RECTAL | Status: DC | PRN

## 2017-05-18 MED ORDER — HYDROCODONE-ACETAMINOPHEN 5-325 MG PO TABS
1.0000 | ORAL_TABLET | ORAL | Status: DC | PRN
  Administered 2017-05-19 – 2017-05-30 (×6): 1 via ORAL
  Filled 2017-05-18 (×7): qty 1

## 2017-05-18 MED ORDER — PROPRANOLOL HCL 80 MG PO TABS: 80.0000 mg | ORAL_TABLET | Freq: Two times a day (BID) | ORAL | Status: DC

## 2017-05-18 MED ORDER — TRAZODONE HCL 50 MG PO TABS: 25.0000 mg | ORAL_TABLET | Freq: Every evening | ORAL | Status: DC | PRN

## 2017-05-18 MED ORDER — FUROSEMIDE 20 MG PO TABS
20.0000 mg | ORAL_TABLET | Freq: Every day | ORAL | Status: DC
  Administered 2017-05-19 – 2017-05-21 (×3): 20 mg via ORAL
  Filled 2017-05-18 (×4): qty 1

## 2017-05-18 MED ORDER — LORATADINE 10 MG PO TABS: 10.0000 mg | ORAL_TABLET | Freq: Every day | ORAL | Status: DC

## 2017-05-18 MED ORDER — ENSURE ENLIVE PO LIQD: 237.0000 mL | Freq: Three times a day (TID) | ORAL | Status: DC

## 2017-05-18 MED ORDER — ASPIRIN 81 MG PO CHEW: 81.0000 mg | CHEWABLE_TABLET | Freq: Every day | ORAL | 0 refills | Status: DC

## 2017-05-18 MED ORDER — LORATADINE 10 MG PO TABS
10.0000 mg | ORAL_TABLET | Freq: Every day | ORAL | Status: DC
  Administered 2017-05-19 – 2017-06-09 (×22): 10 mg via ORAL
  Filled 2017-05-18 (×23): qty 1

## 2017-05-18 MED ORDER — PROPRANOLOL HCL 80 MG PO TABS: 80.0000 mg | ORAL_TABLET | Freq: Two times a day (BID) | ORAL | 0 refills | Status: DC

## 2017-05-18 MED ORDER — OMEGA-3-ACID ETHYL ESTERS 1 G PO CAPS
1.0000 g | ORAL_CAPSULE | Freq: Two times a day (BID) | ORAL | Status: DC
  Administered 2017-05-21 – 2017-05-24 (×7): 1 g via ORAL
  Filled 2017-05-18 (×12): qty 1

## 2017-05-18 MED ORDER — LOSARTAN POTASSIUM 50 MG PO TABS: 100.0000 mg | ORAL_TABLET | Freq: Every day | ORAL | Status: DC

## 2017-05-18 MED ORDER — SORBITOL 70 % SOLN
30.0000 mL | Freq: Every day | Status: DC | PRN
  Administered 2017-05-24: 30 mL via ORAL
  Filled 2017-05-18: qty 30

## 2017-05-18 MED ORDER — TRAMADOL HCL 50 MG PO TABS
50.0000 mg | ORAL_TABLET | Freq: Four times a day (QID) | ORAL | Status: DC | PRN
  Administered 2017-05-25 – 2017-05-29 (×3): 50 mg via ORAL
  Filled 2017-05-18 (×4): qty 1

## 2017-05-18 MED ORDER — ACETAMINOPHEN 325 MG PO TABS
650.0000 mg | ORAL_TABLET | ORAL | Status: DC | PRN
  Administered 2017-05-23 – 2017-05-31 (×3): 650 mg via ORAL
  Filled 2017-05-18 (×3): qty 2

## 2017-05-18 MED ORDER — POLYVINYL ALCOHOL 1.4 % OP SOLN
1.0000 [drp] | OPHTHALMIC | Status: DC | PRN
  Filled 2017-05-18: qty 15

## 2017-05-18 MED ORDER — POLYETHYLENE GLYCOL 3350 17 G PO PACK: 17.0000 g | PACK | Freq: Every day | ORAL | Status: DC | PRN

## 2017-05-18 MED ORDER — NITROGLYCERIN 0.4 MG SL SUBL: 0.4000 mg | SUBLINGUAL_TABLET | SUBLINGUAL | Status: DC | PRN

## 2017-05-18 MED ORDER — GABAPENTIN 600 MG PO TABS
300.0000 mg | ORAL_TABLET | Freq: Three times a day (TID) | ORAL | Status: DC
  Administered 2017-05-18 – 2017-06-09 (×66): 300 mg via ORAL
  Filled 2017-05-18 (×64): qty 1

## 2017-05-18 MED ORDER — PANTOPRAZOLE SODIUM 40 MG PO TBEC
40.0000 mg | DELAYED_RELEASE_TABLET | Freq: Every day | ORAL | Status: DC
  Filled 2017-05-18: qty 1

## 2017-05-18 MED ORDER — ATORVASTATIN CALCIUM 40 MG PO TABS
40.0000 mg | ORAL_TABLET | Freq: Every day | ORAL | Status: DC
  Administered 2017-05-18 – 2017-06-08 (×22): 40 mg via ORAL
  Filled 2017-05-18 (×22): qty 1

## 2017-05-18 MED ORDER — PROPRANOLOL HCL 80 MG PO TABS
80.0000 mg | ORAL_TABLET | Freq: Two times a day (BID) | ORAL | Status: DC
  Administered 2017-05-18 – 2017-06-01 (×22): 80 mg via ORAL
  Filled 2017-05-18 (×28): qty 1

## 2017-05-18 MED ORDER — DIPHENHYDRAMINE HCL 12.5 MG/5ML PO ELIX: 12.5000 mg | ORAL_SOLUTION | Freq: Four times a day (QID) | ORAL | Status: DC | PRN

## 2017-05-18 MED ORDER — LOSARTAN POTASSIUM 50 MG PO TABS
100.0000 mg | ORAL_TABLET | Freq: Every day | ORAL | Status: DC
  Administered 2017-05-19 – 2017-05-21 (×3): 100 mg via ORAL
  Filled 2017-05-18 (×4): qty 2

## 2017-05-18 MED ORDER — POLYVINYL ALCOHOL 1.4 % OP SOLN: 1.0000 [drp] | Freq: Every day | OPHTHALMIC | Status: DC | PRN

## 2017-05-18 MED ORDER — FUROSEMIDE 20 MG PO TABS: 20.0000 mg | ORAL_TABLET | Freq: Every day | ORAL | Status: DC

## 2017-05-18 MED ORDER — ACETAMINOPHEN 650 MG RE SUPP: 650.0000 mg | RECTAL | Status: DC | PRN

## 2017-05-18 MED ORDER — GUAIFENESIN-DM 100-10 MG/5ML PO SYRP: 5.0000 mL | ORAL_SOLUTION | Freq: Four times a day (QID) | ORAL | Status: DC | PRN

## 2017-05-18 MED ORDER — PHENOL 1.4 % MT LIQD
1.0000 | OROMUCOSAL | Status: DC | PRN
  Filled 2017-05-18: qty 177

## 2017-05-18 MED ORDER — PANTOPRAZOLE SODIUM 40 MG PO TBEC: 40.0000 mg | DELAYED_RELEASE_TABLET | Freq: Every day | ORAL | Status: DC

## 2017-05-18 MED ORDER — MENTHOL 3 MG MT LOZG
1.0000 | LOZENGE | OROMUCOSAL | Status: DC | PRN
  Filled 2017-05-18: qty 9

## 2017-05-18 MED ORDER — ACETAMINOPHEN 160 MG/5ML PO SOLN: 650.0000 mg | ORAL | Status: DC | PRN

## 2017-05-18 MED ORDER — ALUM & MAG HYDROXIDE-SIMETH 200-200-20 MG/5ML PO SUSP: 30.0000 mL | ORAL | Status: DC | PRN

## 2017-05-18 MED ORDER — ENSURE ENLIVE PO LIQD
237.0000 mL | Freq: Three times a day (TID) | ORAL | Status: DC
  Administered 2017-05-18 – 2017-05-26 (×23): 237 mL via ORAL

## 2017-05-18 MED ORDER — ONDANSETRON HCL 4 MG/2ML IJ SOLN: 4.0000 mg | Freq: Four times a day (QID) | INTRAMUSCULAR | Status: DC | PRN

## 2017-05-18 MED ORDER — TAMSULOSIN HCL 0.4 MG PO CAPS
0.4000 mg | ORAL_CAPSULE | Freq: Every day | ORAL | Status: DC
  Administered 2017-05-18 – 2017-05-27 (×10): 0.4 mg via ORAL
  Filled 2017-05-18 (×10): qty 1

## 2017-05-18 MED ORDER — ENSURE ENLIVE PO LIQD
237.0000 mL | Freq: Three times a day (TID) | ORAL | Status: DC
  Administered 2017-05-18: 237 mL via ORAL

## 2017-05-18 MED ORDER — PRIMIDONE 50 MG PO TABS
50.0000 mg | ORAL_TABLET | Freq: Two times a day (BID) | ORAL | Status: DC
  Administered 2017-05-18 – 2017-06-09 (×44): 50 mg via ORAL
  Filled 2017-05-18 (×44): qty 1

## 2017-05-18 MED ORDER — FLEET ENEMA 7-19 GM/118ML RE ENEM: 1.0000 | ENEMA | Freq: Once | RECTAL | Status: DC | PRN

## 2017-05-18 MED ORDER — PROCHLORPERAZINE EDISYLATE 5 MG/ML IJ SOLN: 5.0000 mg | Freq: Four times a day (QID) | INTRAMUSCULAR | Status: DC | PRN

## 2017-05-18 NOTE — Progress Notes (Signed)
Rehab admissions - I have approval for acute inpatient rehab admission for today.  I will contact attending MD to check for medical readiness.  Call me for questions.  #[phone]

## 2017-05-18 NOTE — Consult Note (Signed)
   [facility] Inpatient Consult   05/18/2017  Brendan [name] 05/19/1940 [id number]  Patient screened for potential [facility] Care Management services. Patient is in the [facility] the [facility] services under patient's [employer] plan.  Chart review reveals [facility]   Please place a [facility] consult after rehab if needs for community care management occurs or for questions contact:   Charlesetta ShanksVictoria Kynnedi Zweig, RN BSN CCM [facility] Liaison  [phone] business mobile phone Toll free office [phone]

## 2017-05-18 NOTE — Progress Notes (Signed)
NEUROHOSPITALISTS STROKE TEAM - DAILY PROGRESS NOTE   SUBJECTIVE (INTERVAL HISTORY) Daughter is at the bedside. Patient is sitting in chair, lethargic. Daughter said pt chocked some food during lunch. Sleepier today than yesterday, maybe not sleeping well at night. More pronounced right UE tremor. Still has aphasia.      OBJECTIVE Lab Results: CBC:  Recent Labs  Lab 05/15/17 1850 05/15/17 1904 05/16/17 0423  WBC 8.7  --  5.8  HGB 11.1* 11.2* 10.3*  HCT 32.7* 33.0* 30.1*  MCV 96.2  --  97.7  PLT 246  --  209   BMP: Recent Labs  Lab 05/15/17 1850 05/15/17 1904 05/18/17 0236  NA 134* 136 136  K 3.6 3.6 3.7  CL 97* 97* 102  CO2 24  --  25  GLUCOSE 214* 207* 238*  BUN 20 21* 11  CREATININE 1.33* 1.20 0.96  CALCIUM 8.7*  --  8.5*   Liver Function Tests:  Recent Labs  Lab 05/15/17 1850  AST 42*  ALT 43  ALKPHOS 80  BILITOT 0.5  PROT 6.3*  ALBUMIN 3.2*   Thyroid Function Studies:  Recent Labs    05/16/17 0713  TSH 7.022*   Cardiac Enzymes:  Recent Labs  Lab 05/16/17 0713 05/16/17 1230 05/16/17 1910  TROPONINI 0.04* 0.03* 0.03*   Coagulation Studies:  Recent Labs    05/15/17 1850  APTT 31  INR 1.05   Urinalysis:  Recent Labs  Lab 05/15/17 2023  COLORURINE YELLOW  APPEARANCEUR CLEAR  LABSPEC 1.013  PHURINE 5.0  GLUCOSEU 50*  HGBUR SMALL*  BILIRUBINUR NEGATIVE  KETONESUR NEGATIVE  PROTEINUR NEGATIVE  NITRITE NEGATIVE  LEUKOCYTESUR NEGATIVE   Urine Drug Screen:     Component Value Date/Time   LABOPIA NONE DETECTED 05/15/2017 2023   COCAINSCRNUR NONE DETECTED 05/15/2017 2023   LABBENZ NONE DETECTED 05/15/2017 2023   AMPHETMU NONE DETECTED 05/15/2017 2023   THCU NONE DETECTED 05/15/2017 2023   LABBARB POSITIVE (A) 05/15/2017 2023    Alcohol Level:  Recent Labs  Lab 05/15/17 1849  ETH <10   PHYSICAL EXAM Temp:  [98 F (36.7 C)-98.7 F (37.1 C)] 98.7 F (37.1 C) (02/01  1418) Pulse Rate:  [58-66] 58 (02/01 1418) Resp:  [18-20] 20 (02/01 1418) BP: (113-142)/(49-59) 142/55 (02/01 1418) SpO2:  [97 %-99 %] 97 % (02/01 1418)  General - obese, well developed, mildly lethargic.  Ophthalmologic - Fundi not visualized due to noncooperation.  Cardiovascular - Regular rate and rhythm.  Mental Status -  Mildly lethargic, but eye open, following simple commands. Difficulty with left and right localization and two step commands. Expressive aphasia. Naming 1/4. Repeating simple sentences, but mild to moderate dysarthria.   Cranial Nerves II - XII - II - right hemianopia. III, IV, VI - Extraocular movements intact. V - Facial sensation intact bilaterally. VII - right facial droop VIII - Hearing & vestibular intact bilaterally. X - Palate elevates symmetrically. XI - Chin turning & shoulder shrug intact bilaterally. XII - Tongue protrusion intact.  Motor Strength - The patient's strength was 0/5 RUE and 3/5 BLEs and at least 4/5 LUE.  Bulk was normal and fasciculations were absent.   Motor Tone - Muscle tone was assessed at the neck and appendages and was normal.  Reflexes - The patient's reflexes were 1+ in all extremities and he had no pathological reflexes.  Sensory - Light touch, temperature/pinprick were assessed and were symmetrical.    Coordination - The patient had normal movements in the left  hand with no ataxia or dysmetria.  Tremor was present on the RUE.  Gait and Station - deferred.   IMAGING: I have personally reviewed the radiological images below and agree with the radiology interpretations.  Ct Head Code Stroke Wo Contrast Ct Angio Head/Neck W Or Wo Contrast Result Date: 05/15/2017 IMPRESSION: 1. Negative CTA for emergent large vessel occlusion. 2. Interval evolution of recently seen intraparenchymal hematoma positioned within the left frontal lobe along the left DBS, with increased regional edema on today's exam without discernible acute  blood products. Edema is largely vasogenic in appearance, and favored to be related to the recently identified evolving hematoma. Despite this, a superimposed small infarct in this region would be extremely difficult to exclude. ASPECTS = 10 3. Mild atherosclerotic change about the carotid bifurcations and carotid siphons without hemodynamically significant or correctable stenosis. 4. Atheromatous plaque at the origin of the vertebral arteries as well as the bilateral V4 segments with resultant moderate stenoses on the right and more severe stenosis on the left.   Echocardiogram:                                               - Left ventricle: The cavity size was normal. Systolic function was   normal. The estimated ejection fraction was in the range of 55%   to 60%. Wall motion was normal; there were no regional wall   motion abnormalities. Left ventricular diastolic function   parameters were normal. - Left atrium: The atrium was mildly dilated. - Atrial septum: No defect or patent foramen ovale was identified.  EEG:    Impression: This awake and drowsy EEG is abnormal due to the presence of: 1. Moderate diffuse slowing of the waking background. 2. Additional occasional focal slowing over the left hemisphere Clinical Correlation of the above findings indicates diffuse cerebral dysfunction that is non-specific in etiology and can be seen with hypoxic/ischemic injury, toxic/metabolic encephalopathies, neurodegenerative disorders, or medication effect. Focal slowing over the left hemisphere indicates focal cerebral dysfunction in this region suggestive of underlying structural or physiologic abnormality. The absence of epileptiform discharges does not rule out a clinical diagnosis of epilepsy.  Clinical correlation is advised.  MRI Brain:   4.5 x 3.1 by approximately 3 cm fluid collection surrounding the left deep brain stimulator electrode in the frontoparietal brain, with thin marginal  enhancement and pronounced surrounding brain edema. Fluid level seen internally. This has enlarged when compared to the postprocedure exam of 05/06/2017, when the maximal diameter was approximately 3.5 cm. This is presumed to represent a postoperative hematoma undergoing evolution. It is not possible for me to exclude the possibility of infection, but I do not strongly favor that based on these imaging characteristics.  Mild edema surrounding the right deep brain stimulator electrode.     IMPRESSION: [name] is a 77 y.o. male with PMH of recent DBS placement for essential tremor. Per report, the batteries for the DBS stimulators have not yet been installed. Staples from the recent surgeries are still in place along the frontoparietal scalp bilaterally. CT imaging reveals:  Intraparenchymal hematoma in Left frontal lobe with vasogenic edema s/p DBS stimulator implantation - rule out acute CVA  Suspected Etiology: needs MRI evaluation Resultant Symptoms:  right facial droop and right sided weakness Stroke Risk Factors: hyperlipidemia and hypertension Other Stroke Risk Factors: Advanced age, CAD  to proceed with testing. EEG remains pending. No seizure activity reported.  PLAN  05/18/2017: Continue Aspirin/ Statin MRI no Acute CVA  EEG no seizure Frequent neuro checks Telemetry monitoring PT/OT/SLP Consult PM & Rehab Consult Case Management /MSW Ongoing aggressive stroke risk factor management Patient's family counseled to be compliant with his antithrombotic medications Patient's family counseled on Lifestyle modifications including, Diet, Exercise, and Stress Follow up with Dr Tat [facility] in 6 weeks  HYPERTENSION: Stable Permissive hypertension (OK if <220/120) for 24-48 hours post stroke and then gradually normalized within 5-7 days. Long term BP goal normotensive. May slowly restart home B/P medications after 48 hours Home Meds: Cozaar,  Inderal  HYPERLIPIDEMIA:    Component Value Date/Time   CHOL 115 05/16/2017 0713   TRIG 64 05/16/2017 0713   HDL 29 (L) 05/16/2017 0713   CHOLHDL 4.0 05/16/2017 0713   VLDL 13 05/16/2017 0713   LDLCALC 73 05/16/2017 0713  Home Meds:  Lipitor 40 mg LDL  goal < 70 Continued on Lipitor to 40 mg daily Continue statin at discharge  PRE- DIABETES: Lab Results  Component Value Date   HGBA1C 5.8 (H) 05/16/2017  HgbA1c goal < 7.0 Continue CBG monitoring and SSI to maintain glucose 140-180 mg/dl DM education   Other Active Problems: Active Problems:   Essential hypertension   RSD (reflex sympathetic dystrophy)   Hyperlipidemia   Weakness   Intracerebral hemorrhage   Cerebral edema (HCC)   Right sided weakness   Aphasia    Hospital day # 3 VTE prophylaxis: SCD's  Diet : Fall precautions Diet Heart Room service appropriate? Yes; Fluid consistency: Thin   FAMILY UPDATES: family at bedside  TEAM UPDATES: Carney Livinghambliss, Marshall L, MD   Prior Home Stroke Medications:  No antithrombotic  Discharge Stroke Meds:  Please discharge patient on ASA 81 mg daily but discharge medications: TBD   Disposition: 03-Skilled Nursing Facility Therapy Recs:               PENDING Follow Up:  Follow-up Information    Tat, Octaviano BattyRebecca S, DO. Schedule an appointment as soon as possible for a visit in 4 week(s).   Specialty:  Neurology Contact information: [address]  [address] [place] [address] [phone]          Freddrick MarchAmin, [name], MD -PCP Follow up in 1-2 weeks      Assessment & plan discussed with with attending physician and they are in agreement.    Brita RompMary A Costello, ANP-C Stroke Neurology Team 05/18/2017 3:03 PM  ATTENDING NOTE: I reviewed above note and agree with the assessment and plan. I have made any additions or clarifications directly to the above note. Pt was seen and examined.   77 year old male with history of a CAD status post CABG 2004, RSD right upper  extremity, HLD, obesity, essential tremor had recent DBS placement on 05/04/17 by Dr. Venetia MaxonStern.  As per daughter, patient started to have confusion, right-sided weakness since the procedure.  Initial CT of the head showed right pneumocephalus, but resolved on the following neuroimaging.  However repeat CT showed left frontal small bleeding along the probe tract.  Patient symptoms not improving, prompted daughter sent patient here for further evaluation.  Head CT showed left frontal hematoma resolved but with surrounding edema.  CTA head neck unremarkable only with bilateral V4 stenosis. EEG showed diffuse slowing with intermittent left focal slowing, no seizure.  MRI with and without contrast showed cerebral edema with evolving ICH which is larger  than previous CT head.  LDL 73 and A1c 5.8.  Elevated TSH 7.022. Now on baby aspirin and continue Lipitor 40.    He still has expressive aphasia, anomia, but able to repeat simple sentences. Has right hemianopia, right mild facial droop and right UE plegia. Intermittent tremor left upper extremity. Pt seems a little more lethargic today with dysphagia and dysarthria more pronounced, could be due to lack of sleep at night. PT/OT recommended CIR which is appropriate.   I had long discussion with daughter at bedside, updated pt current condition, treatment plan and potential prognosis. She expressed understanding and appreciation.   Neurology will sign off. Please call with questions. Pt will follow up with Dr. Arbutus Leas at [facility] this month as scheduled. Thanks for the consult.   Marvel Plan, MD PhD Stroke Neurology 05/18/2017 3:03 PM   To contact Stroke Continuity provider, please refer to [email or web address]. After hours, contact General Neurology

## 2017-05-18 NOTE — Progress Notes (Signed)
Patient was discharged to Inpatient Rehab per MD order. Family at bedside. No complaints, no acute distress noted at this time.

## 2017-05-18 NOTE — Progress Notes (Signed)
Family Medicine Teaching Service Daily Progress Note Intern Pager: [phone]  Patient name: Brendan [name] Medical record number: [id number] Date of birth: [birth year] Age: 77 y.o. Gender: male  Primary Care Provider: [name], MD Consultants: neurology Code Status: full  Pt Overview and Major Events to Date:  1/29 admitted to FPTS, Neurology consulted,  Assessment and Plan: RUE, RLE weakness  Mild aphasia  cognitive slowing CT head negative for ischemic stroke, EEG did not show seizure-like activity. MRI w/ and w/o contrast showed mild enlarging edema, surrounding the right deep brain stimulator electrode consistent with evolving hematoma status post surgery.. Leading differential is post-procedure cognitive slowing either expected sequale from procedure or 2/2 vasogenic edema from resolving hematoma. Patient would likely benefit from more intense inpatient rehab and CIR would be more appropriate placement.  Echo LV EF: 55% -   60% without wall motion motion abnormalities - Appreciate neurology recommendations - appreciate neurosurgery recs - vital signs per floor routine - gabapentin 300mg  tid - Primidone 50mg  bid - Fall precautions - Up with Assistance - passed bedside swallow, Heart healthy diet - saline lock iv - PT/OT/SLP eval and treat - follow up CIR evaluation - Neuro checks q 4 hours - repeat lipid panel - follow up echo read - ASA 81 - SCDs   Essential tremor S/P DBS placement. Will continue gabapentin and primidone while here. Management per neurology. - gabapentin 300mg  tid - primidone 50mg  bid - follow up neurology and neurosurgery recs   Hypertension, stable - continue cozaar 100mg  daily - continue lasix 20mg  daily - continue propranolol 80mg  daily - nitrostat as needed for chest pain  Elevated troponin Stable x4.  No EKG changes.  Echo normal as above.  No endorsing chest pain.  Stable weights.  Do not suspect ACS at this time, may be related to  cardiac stress from current neurologic injury.  HLD Last lipid panel 05/01/2017, cholesterol, HDL, LDL w/n/l.  -Can continue home atorvastatin 40mg  daily  GERD -Continue protonix 40mg  daily  Glaucoma  -Continue liquifilm eye drops  Normocytic Anemia: hgb down to 10.3 from 11.2. This appears to be largely dilutional given drop in plt and wbc. Down from 13.4 pre-op on 1/9. Unclear if he had blood loss from his surgery.  - daily CBC - can consider anemia panel in the future  Hypothyroidism TSH of 7.02 on admission.  T4 within normal limits,  Hold Synthroid at this time.    FEN/GI: heart healthy PPx: SCDs, protonix  Disposition: snf  Subjective:  No complaints.  Daughter in room.   Objective: Temp:  [98 F (36.7 C)-99.2 F (37.3 C)] 98.5 F (36.9 C) (02/01 0408) Pulse Rate:  [62-66] 66 (02/01 0408) Resp:  [18] 18 (02/01 0408) BP: (113-143)/(49-66) 136/59 (02/01 0408) SpO2:  [97 %-99 %] 97 % (02/01 0408) Physical Exam: General:  Somnolent but arousable,, elderly obese, caucasian male Cardiovascular: tachycardic, no murmur/rubs/gallops. Trace to 1+ edema in BLE. Respiratory: Lungs CTAB, no chest pain Gastrointestinal: soft, mild distention, no tenderness, obese, bowel sounds present MSK: unchanged from 1/29 RUE: 4/5 strength on flexion and extension of RUE, 5/5 grip strength, some rigidity noted in RUE RLE: 5/5 strength on extension of RLE, 5/5 with plantar flexion and dorsiflexion of R foot, resting tremor noted with activity and at rest, worse with activity Derm: warm and dry. Well healed C shaped incisional scar on left frontal head Neuro: Oriented to self, not place or time. Weakness in RUE as above. Psych: pleasant, appropriate  Laboratory:  Recent Labs  Lab 05/15/17 1850 05/15/17 1904 05/16/17 0423  WBC 8.7  --  5.8  HGB 11.1* 11.2* 10.3*  HCT 32.7* 33.0* 30.1*  PLT 246  --  209   Recent Labs  Lab 05/15/17 1850 05/15/17 1904 05/18/17 0236  NA 134*  136 136  K 3.6 3.6 3.7  CL 97* 97* 102  CO2 24  --  25  BUN 20 21* 11  CREATININE 1.33* 1.20 0.96  CALCIUM 8.7*  --  8.5*  PROT 6.3*  --   --   BILITOT 0.5  --   --   ALKPHOS 80  --   --   ALT 43  --   --   AST 42*  --   --   GLUCOSE 214* 207* 238*    Imaging/Diagnostic Tests: CLINICAL DATA:  Code stroke. Initial evaluation for acute right-sided weakness, aphasia.  EXAM: CT ANGIOGRAPHY HEAD AND NECK  TECHNIQUE: Multidetector CT imaging of the head and neck was performed using the standard protocol during bolus administration of intravenous contrast. Multiplanar CT image reconstructions and MIPs were obtained to evaluate the vascular anatomy. Carotid stenosis measurements (when applicable) are obtained utilizing NASCET criteria, using the distal internal carotid diameter as the denominator.  CONTRAST:  <See Chart> ISOVUE-370 IOPAMIDOL (ISOVUE-370) INJECTION 76%  COMPARISON:  Prior CT from 05/06/2017.  FINDINGS: CT HEAD FINDINGS  Brain: Bilateral DBS implants in place, tips near the subthalamic nuclei. There has been interval evolution of previously seen parenchymal hematoma along the left DBS, with increased edema now seen within this region, although no acute blood products are now identified (series 3, image 22). A superimposed infarct would be difficult to exclude.  No other acute intracranial hemorrhage. No other acute large vessel territory infarct. No mass lesion or midline shift. No hydrocephalus. No discernible extra-axial fluid collection. Few scattered foci of postoperative pneumocephalus remain, decreased from previous.  Vascular: No hyperdense vessel.  Skull: Scalp soft tissues and calvarium demonstrate no acute abnormality. DBS implants noted.  Sinuses: Globes and orbital soft tissues within normal limits. Patient status post lens extraction bilaterally. Paranasal sinuses and mastoid air cells are clear.  Orbits: None.  ASPECTS  (Alberta Stroke Program Early CT Score)  - Ganglionic level infarction (caudate, lentiform nuclei, internal capsule, insula, M1-M3 cortex): 7  - Supraganglionic infarction (M4-M6 cortex): 3  Total score (0-10 with 10 being normal): 10  Review of the MIP images confirms the above findings  CTA NECK FINDINGS  Aortic arch: Visualized aortic arch of normal caliber with normal branch pattern. 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 No hemodynamically significant stenosis about the origin of the great vessels. Visualized subclavian arteries widely patent.  Right carotid system: Right common carotid artery widely patent to the bifurcation. Minimal centric calcified plaque about the right bifurcation without significant stenosis. Right ICA widely patent distally to the skull base without stenosis, dissection, or occlusion.  Left carotid system: Left common carotid artery patent from its origin to the bifurcation without stenosis. Mild scattered calcified plaque about the left bifurcation without hemodynamically significant stenosis. Left ICA patent to the skull base without stenosis, dissection, or occlusion.  Vertebral arteries: Both of the vertebral arteries arise from the subclavian arteries. Right vertebral artery dominant. Focal plaque at the origin of the vertebral arteries bilaterally with moderate stenosis on the right and more severe stenosis on the left. Vertebral arteries otherwise widely patent within the neck without stenosis, dissection, or occlusion.  Skeleton: No acute osseous abnormality. No  worrisome lytic or blastic osseous lesions. Endplate occipital assimilation noted. 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  Other neck: No acute soft tissue abnormality within the neck. Salivary glands normal. Thyroid normal. No adenopathy.  Upper chest: Visualized upper chest demonstrates no acute abnormality. Irregular pleural  thickening noted at the anterolateral right upper lobe. Partially visualized lungs are otherwise clear.  Review of the MIP images confirms the above findings  CTA HEAD FINDINGS  Anterior circulation: Petrous segments widely patent bilaterally. Smooth calcified plaque throughout the cavernous ICAs with mild diffuse narrowing. Supraclinoid segments widely patent. ICA termini widely patent. A1 segments patent. Anterior communicating artery patent. Anterior cerebral arteries widely patent to their distal aspects without stenosis. No M1 occlusion or stenosis. No proximal M2 occlusion. Distal MCA branches well perfused and symmetric.  Posterior circulation: Right vertebral artery dominant. Multifocal atheromatous plaque within the V4 segments bilaterally with moderate stenosis on the right and multifocal severe stenoses on the left. Posterior inferior cerebral arteries patent bilaterally. Basilar artery widely patent to its distal aspect. Superior cerebral arteries patent bilaterally. Both of the posterior cerebral arteries primarily supplied via the basilar and are well perfused to their distal aspects. Small bilateral posterior communicating arteries noted.  Venous sinuses: Patent.  Anatomic variants: None significant.  No aneurysm.  Delayed phase: Not performed.  Review of the MIP images confirms the above findings  IMPRESSION: 1. Negative CTA for emergent large vessel occlusion. 2. Interval evolution of recently seen intraparenchymal hematoma positioned within the left frontal lobe along the left DBS, with increased regional edema on today's exam without discernible acute blood products. Edema is largely vasogenic in appearance, and favored to be related to the recently identified evolving hematoma. Despite this, a superimposed small infarct in this region would be extremely difficult to exclude. ASPECTS = 10 3. Mild atherosclerotic change about the carotid  bifurcations and carotid siphons without hemodynamically significant or correctable stenosis. 4. Atheromatous plaque at the origin of the vertebral arteries as well as the bilateral V4 segments with resultant moderate stenoses on the right and more severe stenosis on the left.  Critical Value/emergent results were called by telephone at the time of interpretation on 05/15/2017 at 7:44 pm to Dr. Otelia Limes, who verbally acknowledged these results.   Electronically Signed   By: Rise Mu M.D.   On: 05/15/2017 20:22   Garnette Gunner, MD 05/18/2017, 9:33 AM PGY-1, [facility] Family Medicine FPTS Intern pager: [phone], text pages welcome

## 2017-05-18 NOTE — Progress Notes (Addendum)
Subjective: Patient reports "Good morning...pretty good. How about you?"  Objective: Vital signs in last 24 hours: Temp:  [98 F (36.7 C)-99.2 F (37.3 C)] 98.5 F (36.9 C) (02/01 0408) Pulse Rate:  [62-66] 66 (02/01 0408) Resp:  [18] 18 (02/01 0408) BP: (113-143)/(49-66) 136/59 (02/01 0408) SpO2:  [97 %-99 %] 97 % (02/01 0408)  Intake/Output from previous day: No intake/output data recorded. Intake/Output this shift: No intake/output data recorded.  Awakens to voice. Responds readily with conversation, but remains unable to recall place/time, and some names. No change in yesterday's exam with RUE weakness. PEARL. Moving other extremities without difficulty.  Lab Results: Recent Labs    05/15/17 1850 05/15/17 1904 05/16/17 0423  WBC 8.7  --  5.8  HGB 11.1* 11.2* 10.3*  HCT 32.7* 33.0* 30.1*  PLT 246  --  209   BMET Recent Labs    05/15/17 1850 05/15/17 1904 05/18/17 0236  NA 134* 136 136  K 3.6 3.6 3.7  CL 97* 97* 102  CO2 24  --  25  GLUCOSE 214* 207* 238*  BUN 20 21* 11  CREATININE 1.33* 1.20 0.96  CALCIUM 8.7*  --  8.5*    Studies/Results: Mr Laqueta Jean Wo Contrast  Result Date: 05/17/2017 CLINICAL DATA:  Acute presentation with right-sided weakness and right-sided facial droop. Previous thalamic stimulators. EXAM: MRI HEAD WITHOUT AND WITH CONTRAST TECHNIQUE: Multiplanar, multiecho pulse sequences of the brain and surrounding structures were obtained without and with intravenous contrast. CONTRAST:  18mL MULTIHANCE GADOBENATE DIMEGLUMINE 529 MG/ML IV SOLN COMPARISON:  CT 05/15/2017 FINDINGS: Brain: DBS protocol performed. Diffusion imaging not available in this setting. 4.5 x 3.1 by approximately 3 cm fluid collection within the left frontoparietal brain, surrounding the deep brain stimulator, with a fluid level and surrounding edema. Thin marginal enhancement. This is presumed to represent a postoperative hematoma. It is not possible on this examination to exclude  an abscess. On the right, there is mild brain edema along the stimulator without evidence fluid collection. There is no hydrocephalus. No extra-axial collection. Vascular: Major vessels at the base of the brain show flow. Skull and upper cervical spine: No bone abnormality seen. Sinuses/Orbits: Negative Other: None IMPRESSION: 4.5 x 3.1 by approximately 3 cm fluid collection surrounding the left deep brain stimulator electrode in the frontoparietal brain, with thin marginal enhancement and pronounced surrounding brain edema. Fluid level seen internally. This has enlarged when compared to the postprocedure exam of 05/06/2017, when the maximal diameter was approximately 3.5 cm. This is presumed to represent a postoperative hematoma undergoing evolution. It is not possible for me to exclude the possibility of infection, but I do not strongly favor that based on these imaging characteristics. Mild edema surrounding the right deep brain stimulator electrode. Electronically Signed   By: Paulina Fusi M.D.   On: 05/17/2017 08:53    Assessment/Plan:   LOS: 3 days  Hopeful for CIR.    Georgiann Cocker 05/18/2017, 7:13 AM  Intracerebral hematoma reflects a complication (known potential risk) of DBS surgery.  The patient is still not using his right arm much at present.  I have encouraged him to participate actively in PT and with therapies.  Time will tell the extent he recovers function.  I am hopeful that Infant makes a full recovery.  I would also like to express my gratitude to his insurance company, which has agreed for him to stay at [facility] for inpatient rehabilitation.  I spoke with his family about this yesterday and again  to day and they are very pleased about this decision.

## 2017-05-18 NOTE — Progress Notes (Signed)
Patient and family were informed about rehab process including patient safety plan and rehab booklet. 

## 2017-05-18 NOTE — H&P (Signed)
Physical Medicine and Rehabilitation Admission H&P       Chief Complaint  Patient presents with  . Code Stroke  : HPI: Brendan [name] a 77 y.o.right handedmalewith history of CAD with stenting as well as CABG, hypertension, memory loss and bilateral deep brain stimulator electrode placement per Dr. Vertell Limber 05/04/2017 for essential tremors. Prior to need for skilled nursing facility placement patient lived with spouse. Needed assistance with self feeding and ADLs due to tremors.Family did encourage the use of a rolling walker but patient refused.Patient was discharged to [facility] skilled nursing facility 05/11/2017 ambulating minimal assist level. Presented 05/15/2017 with increasing right sided weakness. CT of the head showed interval evolution of recently seen intraparenchymal hematoma positioned within the left frontal lobe along the left deep brain stimulator with increased regional edema Negative CTA.EEG moderate diffuse slowing no seizure activity. Echocardiogram with ejection fraction of 60% no wall motion abnormalities. Follow-up MRI 05/17/2017 with small postoperative hematoma after deep brain stimulator implant and continue to monitor.Maintained on a regular diet.formal physical and occupational therapy evaluations pending. M.D. Has requested physical medicine rehabilitation consult. Patient was admitted for a comprehensive rehabilitation program    Review of Systems  Constitutional: Negative for chills.  HENT: Negative for hearing loss.   Eyes: Negative for blurred vision and double vision.  Cardiovascular: Positive for leg swelling. Negative for chest pain.  Gastrointestinal: Positive for constipation. Negative for nausea and vomiting.       GERD  Genitourinary: Negative for flank pain and hematuria.  Musculoskeletal: Positive for myalgias.  Skin: Negative for rash.  Neurological: Positive for tremors and focal weakness.  Psychiatric/Behavioral: Positive for memory  loss. The patient has insomnia.   All other systems reviewed and are negative.      Past Medical History:  Diagnosis Date  . Anxiety disorder   . Benign enlargement of prostate   . CAD in native artery 1997   Referred for CABG x3 in 2004 for LAD diagonal bifurcation lesion  . Cervical spinal stenosis   . Complex regional pain syndrome of right upper extremity    Right wrist; L arm  . Coronary atherosclerosis of artery bypass graft June 2013   Occluded SVG-D1; Cardiologist Dr. Ellyn Hack  . Gait disorder   . Gastroesophageal reflux disease   . Glaucoma   . Hiatal hernia    GI: Dr Penelope Coop  . Hyperlipidemia LDL goal <70   . Hypertension   . Memory loss    Mild  . Obesity   . Peripheral neuropathy    possible peripheral neuropathy  . S/P CABG x 3 2004    LIMA-LAD, SVG to diagonal, SVG to OM  . Tremor, essential    On Primidone  . Unstable angina pectoris [facility])  June 2013   Cardiac cath: occluded SVG-DI. Patent LIMA-LAD and SVG-OM. EF 45% with apical inferior HK.        Past Surgical History:  Procedure Laterality Date  . CARDIAC CATHETERIZATION    . CATARACT EXTRACTION, BILATERAL    . CORONARY ARTERY BYPASS GRAFT  06/2002   LIMA-LAD, SVG-D1, SVG-OM  . EYE SURGERY    . KNEE SURGERY Left   . LEFT HEART CATHETERIZATION WITH CORONARY/GRAFT ANGIOGRAM N/A 10/13/2011   Procedure: LEFT HEART CATHETERIZATION WITH Beatrix Fetters;  Surgeon: Leonie Man, MD;  Location: [facility] CATH LAB;  Service: Cardiovascular;  Laterality: N/A;  . MINOR PLACEMENT OF FIDUCIAL N/A 04/26/2017   Procedure: Fiducial placement;  Surgeon: Erline Levine, MD;  Location: [facility]  OR;  Service: Neurosurgery;  Laterality: N/A;  Fiducial placement  . NM MYOCAR PERF WALL MOTION  03/23/2009   protocol:Bruce, normal perfusion in all regions, post-stress EF 72%, exercise capacity 7METS, EKG negative for ischemia.  . Shoulder Orthoscopic Surgery   06/2003  . SUBTHALAMIC STIMULATOR  INSERTION Bilateral 05/04/2017   Procedure: Bilateral Deep brain stimulator placement;  Surgeon: Erline Levine, MD;  Location: [place];  Service: Neurosurgery;  Laterality: Bilateral;  Bilateral deep brain stimulator placement  . TRANSTHORACIC ECHOCARDIOGRAM  April 2014   EF 55-60%; no regional wall motion abnormalities. Relatively normal study normal diastolic parameters/abnormal relaxation  . wrist ganglion cyst Left         Family History  Problem Relation Age of Onset  . Heart disease Mother   . Heart disease Brother   . Tremor Paternal Uncle   . Heart disease Brother   . Drug abuse Daughter   . Hepatitis C Daughter    Social History:  reports that he quit smoking about 49 years ago. His smoking use included cigarettes. He has a 60.00 pack-year smoking history. he has never used smokeless tobacco. He reports that he does not drink alcohol or use drugs. Allergies:      Allergies  Allergen Reactions  . Lisinopril Cough   Medications Prior to Admission  Medication Sig Dispense Refill  . atorvastatin (LIPITOR) 40 MG tablet Take 1 tablet (40 mg total) by mouth daily. 90 tablet 3  . fish oil-omega-3 fatty acids 1000 MG capsule Take 1 g by mouth 2 (two) times daily.    . furosemide (LASIX) 20 MG tablet Take 1 tablet (20 mg total) by mouth daily. May take an  Extra dose if needed (Patient taking differently: Take 20 mg by mouth daily. ) 35 tablet 11  . HYDROcodone-acetaminophen (NORCO/VICODIN) 5-325 MG tablet Take 1 tablet by mouth every 6 (six) hours as needed for moderate pain.    [name] ketoconazole (NIZORAL) 2 % shampoo Apply 1 application topically 2 (two) times a week. (Patient taking differently: Apply 1 application topically once a week. ) 120 mL 1  . loratadine (CLARITIN) 10 MG tablet Take 1 tablet (10 mg total) by mouth daily. 30 tablet 11  . losartan (COZAAR) 100 MG tablet Take 1 tablet (100 mg total) by mouth daily. 90 tablet 3  . nitroGLYCERIN (NITROSTAT) 0.4 MG SL  tablet Place 1 tablet (0.4 mg total) under the tongue every 5 (five) minutes as needed for chest pain. call 911 if chest pain not better 30 tablet 3  . omeprazole (PRILOSEC) 20 MG capsule Take 1 capsule (20 mg total) by mouth daily as needed (acid reflux). 90 capsule 3  . propranolol (INDERAL) 80 MG tablet Take 1 tablet (80 mg total) by mouth 2 (two) times daily. 180 tablet 5  . Propylene Glycol (SYSTANE BALANCE) 0.6 % SOLN Place 1 drop into both eyes daily as needed (for dry eyes).    . sennosides-docusate sodium (SENOKOT-S) 8.6-50 MG tablet Take 1 tablet by mouth daily as needed for constipation.    . gabapentin (NEURONTIN) 300 MG capsule Take 2 capsules (600 mg total) by mouth 3 (three) times daily. (Patient taking differently: Take 600 mg by mouth at bedtime. x3 days. Then stop on 05/18/17.) 180 capsule 5  . primidone (MYSOLINE) 250 MG tablet Take 250 mg by mouth 2 (two) times daily.    . primidone (MYSOLINE) 50 MG tablet 04/19/17: 250 mg am, 200 mg hs; 04/20/17: 200 mg am, 200 mg hs,  04/21/17: 200 mg am, 150 mg hs; 1/6-1/7: 150 mg BID; 1/8-1/9: 150 mg am, 100 mg hs; 1/10-1/11: 100 mg BID; 1/12-1/13: 100 mg am, 50 mg hs; 1/14: 50 mg BID, 1/15: 50 mg QD, 1/16: Stop (Patient not taking: Reported on 05/15/2017) 60 tablet 0    Drug Regimen Review Drug regimen was reviewed and remains appropriate with no significant issues identified  Home: Home Living Family/patient expects to be discharged to:: Private residence Living Arrangements: Spouse/significant other Available Help at Discharge: Family, Available 24 hours/day Type of Home: House Home Access: Stairs to enter Technical brewer of Steps: 4 Entrance Stairs-Rails: Can reach both Home Layout: One level Bathroom Shower/Tub: Tub/shower unit, Multimedia programmer: Standard Home Equipment: Environmental consultant - 2 wheels, Radio producer - single point  Lives With: Spouse   Functional History: Prior Function Level of Independence: Needs  assistance Gait / Transfers Assistance Needed: daughter reports he was very independent PTA  ADL's / Homemaking Assistance Needed: Assist with self-feeding and ADL due to tremors, showered and dressed himself  Functional Status:  Mobility: Bed Mobility Overal bed mobility: Needs Assistance Bed Mobility: Supine to Sit Supine to sit: Min assist Sit to supine: Min guard General bed mobility comments: bed mobility improving today, able to come to EOB with general MinA for LEs and trunk  Transfers Overall transfer level: Needs assistance Equipment used: Rolling walker (2 wheeled) Transfers: Sit to/from Stand Sit to Stand: Mod assist General transfer comment: heavy ModA today, continues to require assistance in placing R UE on walker  Ambulation/Gait Ambulation/Gait assistance: Mod assist Ambulation Distance (Feet): 15 Feet Assistive device: Rolling walker (2 wheeled) Gait Pattern/deviations: Step-through pattern, Decreased step length - right, Decreased step length - left, Decreased dorsiflexion - right, Decreased dorsiflexion - left, Shuffle, Trunk flexed, Drifts right/left General Gait Details: patient with increased LOB/unsteadiness today requiring ModA to maintain balance and safety; he reports he did not sleep well last night and is very fatigued   ADL: ADL General ADL Comments: Total assistance for all ADL at this time.   Cognition: Cognition Overall Cognitive Status: Impaired/Different from baseline Arousal/Alertness: Awake/alert Orientation Level: Oriented to person, Oriented to place, Disoriented to time, Disoriented to situation Attention: Sustained Sustained Attention: Impaired Sustained Attention Impairment: Verbal basic, Functional basic Memory: Impaired Memory Impairment: Decreased recall of new information Awareness: Impaired Awareness Impairment: Intellectual impairment Problem Solving: Impaired Problem Solving Impairment: Verbal  basic Cognition Arousal/Alertness: Awake/alert Behavior During Therapy: WFL for tasks assessed/performed, Flat affect Overall Cognitive Status: Impaired/Different from baseline Area of Impairment: Following commands, Safety/judgement, Problem solving, Memory Current Attention Level: Sustained Memory: Decreased short-term memory Following Commands: Follows one step commands with increased time Problem Solving: Slow processing, Decreased initiation, Difficulty sequencing, Requires verbal cues, Requires tactile cues General Comments: pt frequently stating, "I don't know" reports memory deficits Difficult to assess due to: Impaired communication  Physical Exam: Blood pressure (!) 136/59, pulse 66, temperature 98.5 F (36.9 C), resp. rate 18, height _0  (1.753 m), weight 88.9 kg (195 lb 15.8 oz), SpO2 97 %. Physical Exam Vitalsreviewed. HENT: Stimulator implantation site with mild bleeding along incision, some dried blood remaining on scalp. Head:Normocephalic.  Eyes: Pupils reactive to light Neck:Normal range of motion.Neck supple.No thyromegalypresent.  Cardiovascular: Regular rate and rhythm without murmur rubs or gallops.  Respiratory:CTA Bilaterally without wheezes or rales. Normal effort  GI:BS +, non-tender, non-distended  Skin. Warm and dry Neurological: Patient is more somnolent today.   He did follow simple commands. Appropriate for simple yes and no  questions. Ongoing left-sided tremor at rest and with movements. Left upper extremity grossly 4 out of 5. Right lower extremity is 2+ to 3- out of 5 with hip flexion and knee extension, 3 out of 5 ankle dorsiflexion and plantar flexion.   Right upper extremity trace to 1/5 deltoid and biceps and absent today distally.Sensation diminished slightly right arm and leg but able to sense pain. Mild right inattention persists.   Psych: Patient flat but generally cooperative when he was  awake       LabResultsLast48Hours        Results for orders placed or performed during the hospital encounter of 05/15/17 (from the past 48 hour(s))  Troponin I (q 6hr x 3)     Status: Abnormal   Collection Time: 05/16/17 12:30 PM  Result Value Ref Range   Troponin I 0.03 (HH) <0.03 ng/mL    Comment: CRITICAL VALUE NOTED.  VALUE IS CONSISTENT WITH PREVIOUSLY REPORTED AND CALLED VALUE.  Troponin I (q 6hr x 3)     Status: Abnormal   Collection Time: 05/16/17  7:10 PM  Result Value Ref Range   Troponin I 0.03 (HH) <0.03 ng/mL    Comment: CRITICAL VALUE NOTED.  VALUE IS CONSISTENT WITH PREVIOUSLY REPORTED AND CALLED VALUE.  T4, free     Status: None   Collection Time: 05/17/17 11:32 AM  Result Value Ref Range   Free T4 0.90 0.61 - 1.12 ng/dL    Comment: (NOTE) Biotin ingestion may interfere with free T4 tests. If the results are inconsistent with the TSH level, previous test results, or the clinical presentation, then consider biotin interference. If needed, order repeat testing after stopping biotin.   Basic metabolic panel     Status: Abnormal   Collection Time: 05/18/17  2:36 AM  Result Value Ref Range   Sodium 136 135 - 145 mmol/L   Potassium 3.7 3.5 - 5.1 mmol/L   Chloride 102 101 - 111 mmol/L   CO2 25 22 - 32 mmol/L   Glucose, Bld 238 (H) 65 - 99 mg/dL   BUN 11 6 - 20 mg/dL   Creatinine, Ser 0.96 0.61 - 1.24 mg/dL   Calcium 8.5 (L) 8.9 - 10.3 mg/dL   GFR calc non Af Amer >60 >60 mL/min   GFR calc Af Amer >60 >60 mL/min    Comment: (NOTE) The eGFR has been calculated using the CKD EPI equation. This calculation has not been validated in all clinical situations. eGFR's persistently <60 mL/min signify possible Chronic Kidney Disease.    Anion gap 9 5 - 15      ImagingResults(Last48hours)  Mr Jeri Cos Wo Contrast  Result Date: 05/17/2017 CLINICAL DATA:  Acute presentation with right-sided weakness and right-sided facial  droop. Previous thalamic stimulators. EXAM: MRI HEAD WITHOUT AND WITH CONTRAST TECHNIQUE: Multiplanar, multiecho pulse sequences of the brain and surrounding structures were obtained without and with intravenous contrast. CONTRAST:  54m MULTIHANCE GADOBENATE DIMEGLUMINE 529 MG/ML IV SOLN COMPARISON:  CT 05/15/2017 FINDINGS: Brain: DBS protocol performed. Diffusion imaging not available in this setting. 4.5 x 3.1 by approximately 3 cm fluid collection within the left frontoparietal brain, surrounding the deep brain stimulator, with a fluid level and surrounding edema. Thin marginal enhancement. This is presumed to represent a postoperative hematoma. It is not possible on this examination to exclude an abscess. On the right, there is mild brain edema along the stimulator without evidence fluid collection. There is no hydrocephalus. No extra-axial collection. Vascular: Major vessels at the  base of the brain show flow. Skull and upper cervical spine: No bone abnormality seen. Sinuses/Orbits: Negative Other: None IMPRESSION: 4.5 x 3.1 by approximately 3 cm fluid collection surrounding the left deep brain stimulator electrode in the frontoparietal brain, with thin marginal enhancement and pronounced surrounding brain edema. Fluid level seen internally. This has enlarged when compared to the postprocedure exam of 05/06/2017, when the maximal diameter was approximately 3.5 cm. This is presumed to represent a postoperative hematoma undergoing evolution. It is not possible for me to exclude the possibility of infection, but I do not strongly favor that based on these imaging characteristics. Mild edema surrounding the right deep brain stimulator electrode. Electronically Signed   By: Nelson Chimes M.D.   On: 05/17/2017 08:53        Medical Problem List and Plan: 1.  Decreased functional mobility secondary to left frontal intraparenchymal hemorrhage with recent deep brain stimulator for tremor.  l              -admit to inpatient rehab 2.  DVT Prophylaxis/Anticoagulation: SCDs. Monitor for any signs of DVT 3. Pain Management: Neurontin 300 mg 3 times a day, Tylenol as needed 4. Mood: Provide emotional support 5. Neuropsych: This patient is not consistently capable of making decisions on his own behalf.             -monitor for ongoing lethargy             -follow sleep chart             -limit neuro-sedating meds when possible, further work up if necessary 6. Skin/Wound Care: Routine skin checks 7. Fluids/Electrolytes/Nutrition: Routine I&O's with follow-up chemistries 8. Hypertension. Cozaar 100 mg daily, Lasix 20 mg daily. 9. CAD with stenting/CABG. No chest pain or shortness of breath 10. Hyperlipidemia. Lipitor,Lovaza' 11. Tremors:             -mysoline and inderal ongoing             -simulator not operational yet   Post Admission Physician Evaluation: 1. Functional deficits secondary  to left frontal intraparenchymal hemorrhage and chronic tremor status post deep brain stimulator. 2. Patient is admitted to receive collaborative, interdisciplinary care between the physiatrist, rehab nursing staff, and therapy team. 3. Patient's level of medical complexity and substantial therapy needs in context of that medical necessity cannot be provided at a lesser intensity of care such as a SNF. 4. Patient has experienced substantial functional loss from his/her baseline which was documented above under the "Functional History" and "Functional Status" headings.  Judging by the patient's diagnosis, physical exam, and functional history, the patient has potential for functional progress which will result in measurable gains while on inpatient rehab.  These gains will be of substantial and practical use upon discharge  in facilitating mobility and self-care at the household level. 5. Physiatrist will provide 24 hour management of medical needs as well as oversight of the therapy plan/treatment and provide  guidance as appropriate regarding the interaction of the two. 6. The Preadmission Screening has been reviewed and patient status is unchanged unless otherwise stated above. 7. 24 hour rehab nursing will assist with bladder management, bowel management, safety, skin/wound care, disease management, medication administration, pain management and patient education  and help integrate therapy concepts, techniques,education, etc. 8. PT will assess and treat for/with: Lower extremity strength, range of motion, stamina, balance, functional mobility, safety, adaptive techniques and equipment, NMR, tremor mgt, arousal, family ed.   Goals are: supervision.  9. OT will assess and treat for/with: ADL's, functional mobility, safety, upper extremity strength, adaptive techniques and equipment, NMR, visual-spatial awareness, family ed.   Goals are: supervision to min assist. Therapy may proceed with showering this patient. 10. SLP will assess and treat for/with: Cognition, communication, family education.  Goals are: Supervision. 11. Case Management and Social Worker will assess and treat for psychological issues and discharge planning. 12. Team conference will be held weekly to assess progress toward goals and to determine barriers to discharge. 13. Patient will receive at least 3 hours of therapy per day at least 5 days per week. 14. ELOS: 8-11 days       15. Prognosis:  excellent     Meredith Staggers, MD, [facility] 05/18/2017  Lavon Paganini Malden, PA-C 05/18/2017

## 2017-05-18 NOTE — Progress Notes (Signed)
Physical Therapy Treatment Patient Details Name: Brendan [name] MRN: [id number] DOB: [birth year] Today's Date: 05/18/2017    History of Present Illness 77yo male with 12-24 hours of R sided weakness, R UE much weaker than R LE. of note, he had a deep brain stimulator placed on 05/04/17 and had been receiving therapy at a rehabilitation facility PTA. CT negative for large bleed, per imaging notes all appears normal following deep brain stimulator placement. PMH anxiety, cervical stenosis, CPRS L UE, hx CABG x3, gait disorder, glaucoma, obesity, peripheral neuropathy, hx heart cath, hx knee surgery, deep brain stimulator placed 05/04/17.     PT Comments    Patient received in bed, pleasant and willing to participate in PT today. He was able to complete functional bed mobility with MinA and maintain midline sitting at EOB with S-Min guard well this morning, and continues to require ModA for functional transfers. Of note, he did demonstrate increased unsteadiness with gait this morning, with LOB requiring ModA to maintain balance/safety. Gait trained around the bed to the chair today with [place] for safety, once reaching chair patient reports he is very fatigued today and did not sleep well last night which may account for difference in physical performance this morning. He was left up in the chair with all needs met, chair alarm active, family present; continue to recommend CIR moving forward.     Follow Up Recommendations  CIR     Equipment Recommendations  None recommended by PT(defer to next venue )    Recommendations for Other Services Rehab consult     Precautions / Restrictions Precautions Precautions: Fall Restrictions Weight Bearing Restrictions: No    Mobility  Bed Mobility Overal bed mobility: Needs Assistance Bed Mobility: Supine to Sit     Supine to sit: Min assist     General bed mobility comments: bed mobility improving today, able to come to EOB with general MinA for LEs  and trunk   Transfers Overall transfer level: Needs assistance Equipment used: Rolling walker (2 wheeled) Transfers: Sit to/from Stand Sit to Stand: Mod assist         General transfer comment: heavy ModA today, continues to require assistance in placing R UE on walker   Ambulation/Gait Ambulation/Gait assistance: Mod assist Ambulation Distance (Feet): 15 Feet Assistive device: Rolling walker (2 wheeled) Gait Pattern/deviations: Step-through pattern;Decreased step length - right;Decreased step length - left;Decreased dorsiflexion - right;Decreased dorsiflexion - left;Shuffle;Trunk flexed;Drifts right/left     General Gait Details: patient with increased LOB/unsteadiness today requiring ModA to maintain balance and safety; he reports he did not sleep well last night and is very fatigued    Science writer    Modified Rankin (Stroke Patients Only)       Balance Overall balance assessment: Needs assistance Sitting-balance support: Feet supported;Bilateral upper extremity supported Sitting balance-Leahy Scale: Fair Sitting balance - Comments: able to maintain midline sitting with S today at EOB    Standing balance support: Bilateral upper extremity supported;During functional activity Standing balance-Leahy Scale: Poor Standing balance comment: reliant on UE support on walker                             Cognition Arousal/Alertness: Awake/alert Behavior During Therapy: WFL for tasks assessed/performed;Flat affect Overall Cognitive Status: Impaired/Different from baseline  Current Attention Level: Sustained Memory: Decreased short-term memory Following Commands: Follows one step commands with increased time     Problem Solving: Slow processing;Decreased initiation;Difficulty sequencing;Requires verbal cues;Requires tactile cues        Exercises      General Comments        Pertinent  Vitals/Pain Pain Assessment: No/denies pain Pain Score: 0-No pain Pain Intervention(s): Limited activity within patient's tolerance;Monitored during session    Home Living                      Prior Function            PT Goals (current goals can now be found in the care plan section) Acute Rehab PT Goals Patient Stated Goal: to get better  PT Goal Formulation: With patient Time For Goal Achievement: 05/23/17 Potential to Achieve Goals: Good Progress towards PT goals: Progressing toward goals    Frequency    Min 3X/week      PT Plan Current plan remains appropriate    Co-evaluation              AM-PAC PT "6 Clicks" Daily Activity  Outcome Measure  Difficulty turning over in bed (including adjusting bedclothes, sheets and blankets)?: Unable Difficulty moving from lying on back to sitting on the side of the bed? : Unable Difficulty sitting down on and standing up from a chair with arms (e.g., wheelchair, bedside commode, etc,.)?: Unable Help needed moving to and from a bed to chair (including a wheelchair)?: A Little Help needed walking in hospital room?: A Little Help needed climbing 3-5 steps with a railing? : A Lot 6 Click Score: 11    End of Session Equipment Utilized During Treatment: Gait belt Activity Tolerance: Patient tolerated treatment well Patient left: in chair;with chair alarm set;with call bell/phone within reach;with family/visitor present   PT Visit Diagnosis: Unsteadiness on feet (R26.81);Other abnormalities of gait and mobility (R26.89);Apraxia (R48.2);Other symptoms and signs involving the nervous system (R29.898);Muscle weakness (generalized) (M62.81)     Time: 1010-1029 PT Time Calculation (min) (ACUTE ONLY): 19 min  Charges:  $Therapeutic Activity: 8-22 mins                    G Codes:       Deniece Ree PT, DPT, CBIS  Supplemental Physical Therapist [place]   Pager [phone]

## 2017-05-18 NOTE — PMR Pre-admission (Signed)
PMR Admission Coordinator Pre-Admission Assessment  Patient: Brendan [name] is an 77 y.o., male MRN: [id number] DOB: 1940-12-06 Height: 5\' 9"  (175.3 cm) Weight: 88.9 kg (195 lb 15.8 oz)            Insurance Information HMO:      PPO:  Yes     PCP:       IPA:       80/20:       OTHER:  Group [employer] PRIMARY: [employer]      Policy#: $[id number]      Subscriber: $3400 CM Name: Darl Pikes      Phone#:     Fax#: [phone] Pre-Cert#: [id number] with approval X 10 days      Employer:  Retired Benefits:  Phone #: [phone]     Name:  On line Eff. Date: 04/17/17     Deduct:  $0      Out of Pocket Max: $3400      Life Max: N/A CIR: $295 days 1-6      SNF: $20 days 1-20; $160 days 21-100 Outpatient:  Medical necessity     Co-Pay: $15/visit Home Health: 100%      Co-Pay: none DME: 80%     Co-Pay: 20% Providers: in network  Emergency Contact Information Contact Information    Name Relation Home Work [place] Spouse [phone]  [phone]   Markus Daft Daughter   [phone]     Current Medical History  Patient Admitting Diagnosis: Left frontal intraparenchymal hemorrhage status post recent deep brain stimulator for tremor  History of Present Illness: A 77 y.o.right handedmalewith history of CAD with stenting as well as CABG, hypertension, memory loss and bilateral deep brain stimulator electrode placement per Dr. [name] 05/04/2017 for essential tremors. Prior to need for skilled nursing facility placement patient lived with spouse. Needed assistance with self feeding and ADLs due to tremors. Family did encourage the use of a rolling walker but patient refused.Patient was discharged to [facility] skilled nursing facility 05/11/2017 ambulating minimal assist level. Presented 05/15/2017 with increasing right sided weakness. CT of the head showed interval evolution of recently seen intraparenchymal hematoma positioned within the left frontal lobe along the left deep brain stimulator with  increased regional edema Negative CTA.EEG moderate diffuse slowing no seizure activity. Echocardiogram with ejection fraction of 60% no wall motion abnormalities. Follow-up MRI 05/17/2017 with small postoperative hematoma after deep brain stimulator implant and continue to monitor.Maintained on a regular diet. Physical and occupational therapy evaluations completed with recommendations for inpatient rehab admission. M.D.  requested physical medicine rehabilitation consult. Patient to be admitted for a comprehensive inpatient rehabilitation program.  Total: 15=NIH  Past Medical History  Past Medical History:  Diagnosis Date  . Anxiety disorder   . Benign enlargement of prostate   . CAD in native artery 1997   Referred for CABG x3 in 2004 for LAD diagonal bifurcation lesion  . Cervical spinal stenosis   . Complex regional pain syndrome of right upper extremity    Right wrist; L arm  . Coronary atherosclerosis of artery bypass graft June 2013   Occluded SVG-D1; Cardiologist Dr. Herbie Baltimore  . Gait disorder   . Gastroesophageal reflux disease   . Glaucoma   . Hiatal hernia    GI: Dr Evette Cristal  . Hyperlipidemia LDL goal <70   . Hypertension   . Memory loss    Mild  . Obesity   . Peripheral neuropathy    possible peripheral neuropathy  . S/P  CABG x 3 2004    LIMA-LAD, SVG to diagonal, SVG to OM  . Tremor, essential    On Primidone  . Unstable angina pectoris [facility])  June 2013   Cardiac cath: occluded SVG-DI. Patent LIMA-LAD and SVG-OM. EF 45% with apical inferior HK.    Family History  family history includes Drug abuse in his daughter; Heart disease in his brother, brother, and mother; Hepatitis C in his daughter; Tremor in his paternal uncle.  Prior Rehab/Hospitalizations: [facility] prior [facility].  Has the patient had major surgery during 100 days prior to admission? Yes.  Patient had a deep brain stimulator placed on 05/04/17.     Current Medications   Current Facility-Administered Medications:  .  acetaminophen (TYLENOL) tablet 650 mg, 650 mg, Oral, Q4H PRN, 650 mg at 05/17/17 1524 **OR** acetaminophen (TYLENOL) solution 650 mg, 650 mg, Per Tube, Q4H PRN **OR** acetaminophen (TYLENOL) suppository 650 mg, 650 mg, Rectal, Q4H PRN, Myrene BuddyFletcher, Jacob, MD .  aspirin chewable tablet 81 mg, 81 mg, Oral, Daily, Beaulah DinningGambino, Christina M, MD, 81 mg at 05/18/17 0943 .  atorvastatin (LIPITOR) tablet 40 mg, 40 mg, Oral, Daily, Myrene BuddyFletcher, Jacob, MD, 40 mg at 05/18/17 0942 .  feeding supplement (ENSURE ENLIVE) (ENSURE ENLIVE) liquid 237 mL, 237 mL, Oral, TID BM, Chambliss, Marshall L, MD, 237 mL at 05/18/17 0943 .  furosemide (LASIX) tablet 20 mg, 20 mg, Oral, Daily, Myrene BuddyFletcher, Jacob, MD, 20 mg at 05/18/17 0942 .  gabapentin (NEURONTIN) tablet 300 mg, 300 mg, Oral, TID, Myrene BuddyFletcher, Jacob, MD, 300 mg at 05/18/17 0942 .  loratadine (CLARITIN) tablet 10 mg, 10 mg, Oral, Daily, Myrene BuddyFletcher, Jacob, MD, 10 mg at 05/18/17 0942 .  losartan (COZAAR) tablet 100 mg, 100 mg, Oral, Daily, Myrene BuddyFletcher, Jacob, MD, 100 mg at 05/18/17 0943 .  nitroGLYCERIN (NITROSTAT) SL tablet 0.4 mg, 0.4 mg, Sublingual, Q5 min PRN, Myrene BuddyFletcher, Jacob, MD .  omega-3 acid ethyl esters (LOVAZA) capsule 1 g, 1 g, Oral, BID, Myrene BuddyFletcher, Jacob, MD .  pantoprazole (PROTONIX) EC tablet 40 mg, 40 mg, Oral, Daily, Myrene BuddyFletcher, Jacob, MD, 40 mg at 05/18/17 0943 .  polyvinyl alcohol (LIQUIFILM TEARS) 1.4 % ophthalmic solution 1 drop, 1 drop, Both Eyes, Daily PRN, Myrene BuddyFletcher, Jacob, MD .  primidone (MYSOLINE) tablet 50 mg, 50 mg, Oral, BID, Myrene BuddyFletcher, Jacob, MD, 50 mg at 05/18/17 [id number] .  propranolol (INDERAL) tablet 80 mg, 80 mg, Oral, BID, Myrene BuddyFletcher, Jacob, MD, 80 mg at 05/18/17 0941 .  senna-docusate (Senokot-S) tablet 1 tablet, 1 tablet, Oral, QHS PRN, Myrene BuddyFletcher, Jacob, MD  Facility-Administered Medications Ordered in Other Encounters:  .  0.9 %  sodium chloride infusion, , Intravenous, Continuous,  Marykay LexHarding, David W, MD .  sodium chloride 0.9 % injection 3 mL, 3 mL, Intravenous, PRN, Marykay LexHarding, David W, MD  Patients Current Diet: Fall precautions Diet Heart Room service appropriate? Yes; Fluid consistency: Thin  Precautions / Restrictions Precautions Precautions: Fall Restrictions Weight Bearing Restrictions: No   Has the patient had 2 or more falls or a fall with injury in the past year?No.  Reports that he fell one time 04/03/17 due to the snow, no injury.  Prior Activity Level Household: Staying at home most of the time.  Goes out for MD appointments only.  Home Assistive Devices / Equipment Home Assistive Devices/Equipment: Environmental consultantWalker (specify type)(four wheel) Home Equipment: Walker - 2 wheels, Cane - single point  Prior Device Use: Indicate devices/aids used by the patient prior to current  illness, exacerbation or injury? None per daughter.  Prior Functional Level Prior Function Level of Independence: Needs assistance Gait / Transfers Assistance Needed: daughter reports he was very independent PTA  ADL's / Homemaking Assistance Needed: Assist with self-feeding and ADL due to tremors, showered and dressed himself  Self Care: Did the patient need help bathing, dressing, using the toilet or eating?  Independent  Indoor Mobility: Did the patient need assistance with walking from room to room (with or without device)? Independent  Stairs: Did the patient need assistance with internal or external stairs (with or without device)? Independent  Functional Cognition: Did the patient need help planning regular tasks such as shopping or remembering to take medications? Independent  Current Functional Level Cognition  Arousal/Alertness: Awake/alert Overall Cognitive Status: Impaired/Different from baseline Difficult to assess due to: Impaired communication Current Attention Level: Sustained Orientation Level: Oriented to person, Oriented to place, Disoriented to time, Disoriented  to situation Following Commands: Follows one step commands with increased time General Comments: pt frequently stating, "I don't know" reports memory deficits Attention: Sustained Sustained Attention: Impaired Sustained Attention Impairment: Verbal basic, Functional basic Memory: Impaired Memory Impairment: Decreased recall of new information Awareness: Impaired Awareness Impairment: Intellectual impairment Problem Solving: Impaired Problem Solving Impairment: Verbal basic    Extremity Assessment (includes Sensation/Coordination)  Upper Extremity Assessment: RUE deficits/detail RUE Deficits / Details: tone WFL, no active movement noted RUE Sensation: decreased proprioception RUE Coordination: decreased fine motor, decreased gross motor LUE Deficits / Details: tremulous, very strong  Lower Extremity Assessment: Defer to PT evaluation    ADLs  General ADL Comments: Total assistance for all ADL at this time.     Mobility  Overal bed mobility: Needs Assistance Bed Mobility: Supine to Sit Supine to sit: Min assist Sit to supine: Min guard General bed mobility comments: bed mobility improving today, able to come to EOB with general MinA for LEs and trunk     Transfers  Overall transfer level: Needs assistance Equipment used: Rolling walker (2 wheeled) Transfers: Sit to/from Stand Sit to Stand: Mod assist General transfer comment: heavy ModA today, continues to require assistance in placing R UE on walker     Ambulation / Gait / Stairs / Wheelchair Mobility  Ambulation/Gait Ambulation/Gait assistance: Mod assist Ambulation Distance (Feet): 15 Feet Assistive device: Rolling walker (2 wheeled) Gait Pattern/deviations: Step-through pattern, Decreased step length - right, Decreased step length - left, Decreased dorsiflexion - right, Decreased dorsiflexion - left, Shuffle, Trunk flexed, Drifts right/left General Gait Details: patient with increased LOB/unsteadiness today requiring  ModA to maintain balance and safety; he reports he did not sleep well last night and is very fatigued     Posture / Balance Dynamic Sitting Balance Sitting balance - Comments: able to maintain midline sitting with S today at EOB  Balance Overall balance assessment: Needs assistance Sitting-balance support: Feet supported, Bilateral upper extremity supported Sitting balance-Leahy Scale: Fair Sitting balance - Comments: able to maintain midline sitting with S today at EOB  Standing balance support: Bilateral upper extremity supported, During functional activity Standing balance-Leahy Scale: Poor Standing balance comment: reliant on UE support on walker     Special needs/care consideration BiPAP/CPAP No CPM No Continuous Drip IV No  Dialysis No      Life Vest No Oxygen No Special Bed No Trach Size No Wound Vac (area) No  Skin Sacral redness, pressure injury  Bowel mgmt: Last BM 05/18/17 Bladder mgmt: Condom catheter Diabetic mgmt No    Previous Home Environment Living Arrangements: Spouse/significant other  Lives With: Spouse Available Help at Discharge: Family, Available 24 hours/day Type of Home: House Care Facility Name: [facility] Home Layout: One level Home Access: Stairs to enter Entrance Stairs-Rails: Can reach both Entrance Stairs-Number of Steps: 4 Bathroom Shower/Tub: Hydrographic surveyor, Health visitor: Standard Home Care Services: No  Discharge Living Setting Plans for Discharge Living Setting: Patient's home, House, Lives with (comment)(Lives with wife.) Type of Home at Discharge: House Discharge Home Layout: One level Discharge Home Access: Stairs to enter Entrance Stairs-Number of Steps: 4-5 steps to deck entry. Does the patient have any problems obtaining your medications?: No  Social/Family/Support Systems Patient Roles: Spouse, Parent, Other (Comment)(Has a wife, 2 daughters, a sister.) Contact Information:  Jerrad Mendibles - wife Anticipated Caregiver: wife Anticipated Caregiver's Contact Information: Corrie Dandy - wife - (c) [phone] Ability/Limitations of Caregiver: Wife is retired and can assist. Medical laboratory scientific officer: 24/7 Discharge Plan Discussed with Primary Caregiver: Yes Is Caregiver In Agreement with Plan?: Yes Does Caregiver/Family have Issues with Lodging/Transportation while Pt is in Rehab?: No  Goals/Additional Needs Patient/Family Goal for Rehab: PT supervision, OT supervision to min assit, SLP supervision goals Expected length of stay: 8-11 days Cultural Considerations: None Dietary Needs: Heart diet, thin liquids Equipment Needs: TBD Pt/Family Agrees to Admission and willing to participate: Yes Program Orientation Provided & Reviewed with Pt/Caregiver Including Roles  & Responsibilities: Yes  Decrease burden of Care through IP rehab admission: N/A  Possible need for SNF placement upon discharge: Not planned  Patient Condition: This patient's condition remains as documented in the consult dated 05/17/17, in which the Rehabilitation Physician determined and documented that the patient's condition is appropriate for intensive rehabilitative care in an inpatient rehabilitation facility. Will admit to inpatient rehab today.  Preadmission Screen Completed By:  Trish Mage, 05/18/2017 2:20 PM ______________________________________________________________________   Discussed status with Dr. Riley Kill on 05/18/17 at 1420 and received telephone approval for admission today.  Admission Coordinator:  Trish Mage, time 1420 Dorna Bloom 05/18/17

## 2017-05-18 NOTE — H&P (Signed)
Physical Medicine and Rehabilitation Admission H&P    Chief Complaint  Patient presents with  . Code Stroke  : HPI: Brendan [name] is a 77 y.o.right handed male with history of CAD with stenting as well as CABG, hypertension, memory loss and bilateral deep brain stimulator electrode placement per Dr. Vertell Limber 05/04/2017 for essential tremors. Prior to need for skilled nursing facility placement patient lived with spouse. Needed assistance with self feeding and ADLs due to tremors.Family did encourage the use of a rolling walker but patient refused.Patient was discharged to [facility] skilled nursing facility 05/11/2017 ambulating minimal assist level. Presented 05/15/2017 with increasing right sided weakness. CT of the head showed interval evolution of recently seen intraparenchymal hematoma positioned within the left frontal lobe along the left deep brain stimulator with increased regional edema Negative CTA.EEG moderate diffuse slowing no seizure activity. Echocardiogram with ejection fraction of 60% no wall motion abnormalities. Follow-up MRI 05/17/2017 with small postoperative hematoma after deep brain stimulator implant and continue to monitor.Maintained on a regular diet.formal physical and occupational therapy evaluations pending. M.D. Has requested physical medicine rehabilitation consult. Patient was admitted for a comprehensive rehabilitation program    Review of Systems  Constitutional: Negative for chills.  HENT: Negative for hearing loss.   Eyes: Negative for blurred vision and double vision.  Cardiovascular: Positive for leg swelling. Negative for chest pain.  Gastrointestinal: Positive for constipation. Negative for nausea and vomiting.       GERD  Genitourinary: Negative for flank pain and hematuria.  Musculoskeletal: Positive for myalgias.  Skin: Negative for rash.  Neurological: Positive for tremors and focal weakness.  Psychiatric/Behavioral: Positive for memory loss. The  patient has insomnia.   All other systems reviewed and are negative.  Past Medical History:  Diagnosis Date  . Anxiety disorder   . Benign enlargement of prostate   . CAD in native artery 1997   Referred for CABG x3 in 2004 for LAD diagonal bifurcation lesion  . Cervical spinal stenosis   . Complex regional pain syndrome of right upper extremity    Right wrist; L arm  . Coronary atherosclerosis of artery bypass graft June 2013   Occluded SVG-D1; Cardiologist Dr. Ellyn Hack  . Gait disorder   . Gastroesophageal reflux disease   . Glaucoma   . Hiatal hernia    GI: Dr Penelope Coop  . Hyperlipidemia LDL goal <70   . Hypertension   . Memory loss    Mild  . Obesity   . Peripheral neuropathy    possible peripheral neuropathy  . S/P CABG x 3 2004    LIMA-LAD, SVG to diagonal, SVG to OM  . Tremor, essential    On Primidone  . Unstable angina pectoris [facility])  June 2013   Cardiac cath: occluded SVG-DI. Patent LIMA-LAD and SVG-OM. EF 45% with apical inferior HK.   Past Surgical History:  Procedure Laterality Date  . CARDIAC CATHETERIZATION    . CATARACT EXTRACTION, BILATERAL    . CORONARY ARTERY BYPASS GRAFT  06/2002   LIMA-LAD, SVG-D1, SVG-OM  . EYE SURGERY    . KNEE SURGERY Left   . LEFT HEART CATHETERIZATION WITH CORONARY/GRAFT ANGIOGRAM N/A 10/13/2011   Procedure: LEFT HEART CATHETERIZATION WITH Beatrix Fetters;  Surgeon: Leonie Man, MD;  Location: [facility] C[facility] LAB;  Service: Cardiovascular;  Laterality: N/A;  . MINOR PLACEMENT OF FIDUCIAL N/A 04/26/2017   Procedure: Fiducial placement;  Surgeon: Erline Levine, MD;  Location: [place];  Service: Neurosurgery;  Laterality: N/A;  Fiducial  placement  . NM MYOCAR PERF WALL MOTION  03/23/2009   protocol:Bruce, normal perfusion in all regions, post-stress EF 72%, exercise capacity 7METS, EKG negative for ischemia.  . Shoulder Orthoscopic Surgery   06/2003  . SUBTHALAMIC STIMULATOR INSERTION Bilateral 05/04/2017   Procedure: Bilateral Deep  brain stimulator placement;  Surgeon: Erline Levine, MD;  Location: [facility];  Service: Neurosurgery;  Laterality: Bilateral;  Bilateral deep brain stimulator placement  . TRANSTHORACIC ECHOCARDIOGRAM  April 2014   EF 55-60%; no regional wall motion abnormalities. Relatively normal study normal diastolic parameters/abnormal relaxation  . wrist ganglion cyst Left    Family History  Problem Relation Age of Onset  . Heart disease Mother   . Heart disease Brother   . Tremor Paternal Uncle   . Heart disease Brother   . Drug abuse Daughter   . Hepatitis C Daughter    Social History:  reports that he quit smoking about 49 years ago. His smoking use included cigarettes. He has a 60.00 pack-year smoking history. he has never used smokeless tobacco. He reports that he does not drink alcohol or use drugs. Allergies:  Allergies  Allergen Reactions  . Lisinopril Cough   Medications Prior to Admission  Medication Sig Dispense Refill  . atorvastatin (LIPITOR) 40 MG tablet Take 1 tablet (40 mg total) by mouth daily. 90 tablet 3  . fish oil-omega-3 fatty acids 1000 MG capsule Take 1 g by mouth 2 (two) times daily.    . furosemide (LASIX) 20 MG tablet Take 1 tablet (20 mg total) by mouth daily. May take an  Extra dose if needed (Patient taking differently: Take 20 mg by mouth daily. ) 35 tablet 11  . HYDROcodone-acetaminophen (NORCO/VICODIN) 5-325 MG tablet Take 1 tablet by mouth every 6 (six) hours as needed for moderate pain.    [name] ketoconazole (NIZORAL) 2 % shampoo Apply 1 application topically 2 (two) times a week. (Patient taking differently: Apply 1 application topically once a week. ) 120 mL 1  . loratadine (CLARITIN) 10 MG tablet Take 1 tablet (10 mg total) by mouth daily. 30 tablet 11  . losartan (COZAAR) 100 MG tablet Take 1 tablet (100 mg total) by mouth daily. 90 tablet 3  . nitroGLYCERIN (NITROSTAT) 0.4 MG SL tablet Place 1 tablet (0.4 mg total) under the tongue every 5 (five) minutes as needed  for chest pain. call 911 if chest pain not better 30 tablet 3  . omeprazole (PRILOSEC) 20 MG capsule Take 1 capsule (20 mg total) by mouth daily as needed (acid reflux). 90 capsule 3  . propranolol (INDERAL) 80 MG tablet Take 1 tablet (80 mg total) by mouth 2 (two) times daily. 180 tablet 5  . Propylene Glycol (SYSTANE BALANCE) 0.6 % SOLN Place 1 drop into both eyes daily as needed (for dry eyes).    . sennosides-docusate sodium (SENOKOT-S) 8.6-50 MG tablet Take 1 tablet by mouth daily as needed for constipation.    . gabapentin (NEURONTIN) 300 MG capsule Take 2 capsules (600 mg total) by mouth 3 (three) times daily. (Patient taking differently: Take 600 mg by mouth at bedtime. x3 days. Then stop on 05/18/17.) 180 capsule 5  . primidone (MYSOLINE) 250 MG tablet Take 250 mg by mouth 2 (two) times daily.    . primidone (MYSOLINE) 50 MG tablet 04/19/17: 250 mg am, 200 mg hs; 04/20/17: 200 mg am, 200 mg hs, 04/21/17: 200 mg am, 150 mg hs; 1/6-1/7: 150 mg BID; 1/8-1/9: 150 mg am, 100 mg hs;  1/10-1/11: 100 mg BID; 1/12-1/13: 100 mg am, 50 mg hs; 1/14: 50 mg BID, 1/15: 50 mg QD, 1/16: Stop (Patient not taking: Reported on 05/15/2017) 60 tablet 0    Drug Regimen Review Drug regimen was reviewed and remains appropriate with no significant issues identified  Home: Home Living Family/patient expects to be discharged to:: Private residence Living Arrangements: Spouse/significant other Available Help at Discharge: Family, Available 24 hours/day Type of Home: House Home Access: Stairs to enter Technical brewer of Steps: 4 Entrance Stairs-Rails: Can reach both Home Layout: One level Bathroom Shower/Tub: Tub/shower unit, Multimedia programmer: Standard Home Equipment: Environmental consultant - 2 wheels, Radio producer - single point  Lives With: Spouse   Functional History: Prior Function Level of Independence: Needs assistance Gait / Transfers Assistance Needed: daughter reports he was very independent PTA  ADL's /  Homemaking Assistance Needed: Assist with self-feeding and ADL due to tremors, showered and dressed himself  Functional Status:  Mobility: Bed Mobility Overal bed mobility: Needs Assistance Bed Mobility: Supine to Sit Supine to sit: Min assist Sit to supine: Min guard General bed mobility comments: bed mobility improving today, able to come to EOB with general MinA for LEs and trunk  Transfers Overall transfer level: Needs assistance Equipment used: Rolling walker (2 wheeled) Transfers: Sit to/from Stand Sit to Stand: Mod assist General transfer comment: heavy ModA today, continues to require assistance in placing R UE on walker  Ambulation/Gait Ambulation/Gait assistance: Mod assist Ambulation Distance (Feet): 15 Feet Assistive device: Rolling walker (2 wheeled) Gait Pattern/deviations: Step-through pattern, Decreased step length - right, Decreased step length - left, Decreased dorsiflexion - right, Decreased dorsiflexion - left, Shuffle, Trunk flexed, Drifts right/left General Gait Details: patient with increased LOB/unsteadiness today requiring ModA to maintain balance and safety; he reports he did not sleep well last night and is very fatigued     ADL: ADL General ADL Comments: Total assistance for all ADL at this time.   Cognition: Cognition Overall Cognitive Status: Impaired/Different from baseline Arousal/Alertness: Awake/alert Orientation Level: Oriented to person, Oriented to place, Disoriented to time, Disoriented to situation Attention: Sustained Sustained Attention: Impaired Sustained Attention Impairment: Verbal basic, Functional basic Memory: Impaired Memory Impairment: Decreased recall of new information Awareness: Impaired Awareness Impairment: Intellectual impairment Problem Solving: Impaired Problem Solving Impairment: Verbal basic Cognition Arousal/Alertness: Awake/alert Behavior During Therapy: WFL for tasks assessed/performed, Flat affect Overall  Cognitive Status: Impaired/Different from baseline Area of Impairment: Following commands, Safety/judgement, Problem solving, Memory Current Attention Level: Sustained Memory: Decreased short-term memory Following Commands: Follows one step commands with increased time Problem Solving: Slow processing, Decreased initiation, Difficulty sequencing, Requires verbal cues, Requires tactile cues General Comments: pt frequently stating, "I don't know" reports memory deficits Difficult to assess due to: Impaired communication  Physical Exam: Blood pressure (!) 136/59, pulse 66, temperature 98.5 F (36.9 C), resp. rate 18, height _0  (1.753 m), weight 88.9 kg (195 lb 15.8 oz), SpO2 97 %. Physical Exam Vitals reviewed. HENT: Stimulator implantation site with mild bleeding along incision, some dried blood remaining on scalp. Head: Normocephalic.  Eyes:  Pupils reactive to light  Neck: Normal range of motion. Neck supple. No thyromegaly present.  Cardiovascular:  Regular rate and rhythm without murmur rubs or gallops.  Respiratory: CTA Bilaterally without wheezes or rales. Normal effort  GI: BS +, non-tender, non-distended  Skin. Warm and dry Neurological:  Patient is more somnolent today.   He did follow simple commands. Appropriate for simple yes and no questions.  Ongoing left-sided  tremor at rest and with movements.  Left upper extremity grossly 4 out of 5.  Right lower extremity is 2+ to 3- out of 5 with hip flexion and knee extension, 3 out of 5 ankle dorsiflexion and plantar flexion.   Right upper extremity trace to 1/5 deltoid and biceps and absent today distally. Sensation diminished slightly right arm and leg but able to sense pain.  Mild right inattention persists.   Psych: Patient flat but generally cooperative when he was awake       Results for orders placed or performed during the hospital encounter of 05/15/17 (from the past 48 hour(s))  Troponin I (q 6hr x 3)     Status:  Abnormal   Collection Time: 05/16/17 12:30 PM  Result Value Ref Range   Troponin I 0.03 (HH) <0.03 ng/mL    Comment: CRITICAL VALUE NOTED.  VALUE IS CONSISTENT WITH PREVIOUSLY REPORTED AND CALLED VALUE.  Troponin I (q 6hr x 3)     Status: Abnormal   Collection Time: 05/16/17  7:10 PM  Result Value Ref Range   Troponin I 0.03 (HH) <0.03 ng/mL    Comment: CRITICAL VALUE NOTED.  VALUE IS CONSISTENT WITH PREVIOUSLY REPORTED AND CALLED VALUE.  T4, free     Status: None   Collection Time: 05/17/17 11:32 AM  Result Value Ref Range   Free T4 0.90 0.61 - 1.12 ng/dL    Comment: (NOTE) Biotin ingestion may interfere with free T4 tests. If the results are inconsistent with the TSH level, previous test results, or the clinical presentation, then consider biotin interference. If needed, order repeat testing after stopping biotin.   Basic metabolic panel     Status: Abnormal   Collection Time: 05/18/17  2:36 AM  Result Value Ref Range   Sodium 136 135 - 145 mmol/L   Potassium 3.7 3.5 - 5.1 mmol/L   Chloride 102 101 - 111 mmol/L   CO2 25 22 - 32 mmol/L   Glucose, Bld 238 (H) 65 - 99 mg/dL   BUN 11 6 - 20 mg/dL   Creatinine, Ser 0.96 0.61 - 1.24 mg/dL   Calcium 8.5 (L) 8.9 - 10.3 mg/dL   GFR calc non Af Amer >60 >60 mL/min   GFR calc Af Amer >60 >60 mL/min    Comment: (NOTE) The eGFR has been calculated using the CKD EPI equation. This calculation has not been validated in all clinical situations. eGFR's persistently <60 mL/min signify possible Chronic Kidney Disease.    Anion gap 9 5 - 15   Mr Brain W Wo Contrast  Result Date: 05/17/2017 CLINICAL DATA:  Acute presentation with right-sided weakness and right-sided facial droop. Previous thalamic stimulators. EXAM: MRI HEAD WITHOUT AND WITH CONTRAST TECHNIQUE: Multiplanar, multiecho pulse sequences of the brain and surrounding structures were obtained without and with intravenous contrast. CONTRAST:  4m MULTIHANCE GADOBENATE DIMEGLUMINE  529 MG/ML IV SOLN COMPARISON:  CT 05/15/2017 FINDINGS: Brain: DBS protocol performed. Diffusion imaging not available in this setting. 4.5 x 3.1 by approximately 3 cm fluid collection within the left frontoparietal brain, surrounding the deep brain stimulator, with a fluid level and surrounding edema. Thin marginal enhancement. This is presumed to represent a postoperative hematoma. It is not possible on this examination to exclude an abscess. On the right, there is mild brain edema along the stimulator without evidence fluid collection. There is no hydrocephalus. No extra-axial collection. Vascular: Major vessels at the base of the brain show flow. Skull and upper cervical spine: No  bone abnormality seen. Sinuses/Orbits: Negative Other: None IMPRESSION: 4.5 x 3.1 by approximately 3 cm fluid collection surrounding the left deep brain stimulator electrode in the frontoparietal brain, with thin marginal enhancement and pronounced surrounding brain edema. Fluid level seen internally. This has enlarged when compared to the postprocedure exam of 05/06/2017, when the maximal diameter was approximately 3.5 cm. This is presumed to represent a postoperative hematoma undergoing evolution. It is not possible for me to exclude the possibility of infection, but I do not strongly favor that based on these imaging characteristics. Mild edema surrounding the right deep brain stimulator electrode. Electronically Signed   By: Nelson Chimes M.D.   On: 05/17/2017 08:53       Medical Problem List and Plan: 1.  Decreased functional mobility secondary to left frontal intraparenchymal hemorrhage with recent deep brain stimulator for tremor.  l  -admit to inpatient rehab 2.  DVT Prophylaxis/Anticoagulation: SCDs. Monitor for any signs of DVT 3. Pain Management: Neurontin 300 mg 3 times a day, Tylenol as needed 4. Mood: Provide emotional support 5. Neuropsych: This patient is not consistently capable of making decisions on his own  behalf.   -monitor for ongoing lethargy   -follow sleep chart   -limit neuro-sedating meds when possible 6. Skin/Wound Care: Routine skin checks 7. Fluids/Electrolytes/Nutrition: Routine I&O's with follow-up chemistries 8. Hypertension. Cozaar 100 mg daily, Lasix 20 mg daily. 9. CAD with stenting/CABG. No chest pain or shortness of breath 10. Hyperlipidemia. Lipitor,Lovaza' 11. Tremors:   -mysoline and inderal ongoing   -simulator not operational yet   Post Admission Physician Evaluation: 1. Functional deficits secondary  to left frontal intraparenchymal hemorrhage and chronic tremor status post deep brain stimulator. 2. Patient is admitted to receive collaborative, interdisciplinary care between the physiatrist, rehab nursing staff, and therapy team. 3. Patient's level of medical complexity and substantial therapy needs in context of that medical necessity cannot be provided at a lesser intensity of care such as a SNF. 4. Patient has experienced substantial functional loss from his/her baseline which was documented above under the "Functional History" and "Functional Status" headings.  Judging by the patient's diagnosis, physical exam, and functional history, the patient has potential for functional progress which will result in measurable gains while on inpatient rehab.  These gains will be of substantial and practical use upon discharge  in facilitating mobility and self-care at the household level. 5. Physiatrist will provide 24 hour management of medical needs as well as oversight of the therapy plan/treatment and provide guidance as appropriate regarding the interaction of the two. 6. The Preadmission Screening has been reviewed and patient status is unchanged unless otherwise stated above. 7. 24 hour rehab nursing will assist with bladder management, bowel management, safety, skin/wound care, disease management, medication administration, pain management and patient education  and help  integrate therapy concepts, techniques,education, etc. 8. PT will assess and treat for/with: Lower extremity strength, range of motion, stamina, balance, functional mobility, safety, adaptive techniques and equipment, NMR, tremor mgt, arousal, family ed.   Goals are: supervision. 9. OT will assess and treat for/with: ADL's, functional mobility, safety, upper extremity strength, adaptive techniques and equipment, NMR, visual-spatial awareness, family ed.   Goals are: supervision to min assist. Therapy may proceed with showering this patient. 10. SLP will assess and treat for/with: Cognition, communication, family education.  Goals are: Supervision. 11. Case Management and Social Worker will assess and treat for psychological issues and discharge planning. 12. Team conference will be held weekly to assess progress  toward goals and to determine barriers to discharge. 13. Patient will receive at least 3 hours of therapy per day at least 5 days per week. 14. ELOS: 8-11 days       15. Prognosis:  excellent     Meredith Staggers, MD, [facility] 05/18/2017  Lavon Paganini [place], PA-C 05/18/2017

## 2017-05-18 NOTE — Progress Notes (Signed)
  Speech Language Pathology Treatment: Cognitive-Linquistic  Patient Details Name: [name] W Belcher MRN: [id number] DOB: 03/31/1941 Today's Date: 05/18/2017 Time: 1435-1450 SLP Time Calculation (min) (ACUTE ONLY): 15 min  Assessment / Plan / Recommendation Clinical Impression  Pt was seen for skilled ST targeting speech goals.  Pt was sleeping upon therapist's arrival, partially reclined in armchair.  SLP repositioned pt to maximize alertness for meaningful participation in therapy.  Pt noted with increase in spontaneous verbalizations today in response to greetings and basic social exchanges.  Pt was able to name objects in his room with mod-max assist semantic and phonemic cues.  Pt was able to complete automatic sequences for counting 1-20, naming days of the week, and months of the year with mod assist verbal cues.  Pt is making good progress but has poor endurance and began to fatigue by the end of today's therapy session.  Pt left resting in chair with daughter at bedside.  Continue per current plan of care.    HPI HPI: Lillia MountainJerry W Kirkmanis a 77 y.o.malewithPMH is significant forCABG x3, Essential tremor, GERD, glaucoma, HTN, Obesity, Urinary retention, seborrheic dermatitis presentingon 1/29withright sided weaknessand expressive aphasia. The patient's daughter said that she first noticed his right arm becoming weaker on 1/28 to the point to where he could not move that arm at all. Concurrently he would "have trouble getting the proper words out". Patient is currently in a rehab facility ([facility]) after having a deep brain stimulator placed on 1/18 secondary to an essential tremor. Per reports his rehab facility grew concerned with his developing weakness and sent him to the ED in the afternoon of 1/29. In the ED a code stroke was fired. 05/16/17 he underwent CT head and CTA which were negative for large vessel occlusion. These scans did show evolution of a known small hematoma that the patient  developed after his deep brain stimulator. Per the read it is felt that the hematoma has been resorbed with underlying vasogenic edema filling that space. As mentioned above the patient had a DBS placed by Dr. Venetia MaxonStern on 05/04/17 for tremors. Per reports from the patient's daughter, since having the DBS placed he has had some cognitive slowing, mild rigidity, and a drop in his baseline mental status. From the informed consent this was felt to be a normal acute sequale from the procedure. Neurosurgery and Neurology teams were waiting for patient to return closer to baseline before placing batteries and turning device on. Although a little unclear it appears that the patient was to be on gabapentin and primidone in the meantime.       SLP Plan  Continue with current plan of care       Recommendations                   Follow up Recommendations: Inpatient Rehab SLP Visit Diagnosis: Aphasia (R47.01) Plan: Continue with current plan of care       GO                Kalei Meda, Melanee Spryicole L 05/18/2017, 3:03 PM

## 2017-05-19 ENCOUNTER — Inpatient Hospital Stay (HOSPITAL_COMMUNITY): Payer: PPO | Admitting: Physical Therapy

## 2017-05-19 ENCOUNTER — Inpatient Hospital Stay (HOSPITAL_COMMUNITY): Payer: PPO | Admitting: Speech Pathology

## 2017-05-19 ENCOUNTER — Inpatient Hospital Stay (HOSPITAL_COMMUNITY): Payer: PPO | Admitting: Occupational Therapy

## 2017-05-19 ENCOUNTER — Inpatient Hospital Stay (HOSPITAL_COMMUNITY): Payer: PPO

## 2017-05-19 DIAGNOSIS — I69321 Dysphasia following cerebral infarction: Secondary | ICD-10-CM

## 2017-05-19 DIAGNOSIS — R05 Cough: Secondary | ICD-10-CM

## 2017-05-19 LAB — CBC WITH DIFFERENTIAL/PLATELET
Basophils Absolute: 0 10*3/uL (ref 0.0–0.1)
Basophils Relative: 0 %
Eosinophils Absolute: 0.1 10*3/uL (ref 0.0–0.7)
Eosinophils Relative: 1 %
HCT: 34.3 % — ABNORMAL LOW (ref 39.0–52.0)
Hemoglobin: 11.5 g/dL — ABNORMAL LOW (ref 13.0–17.0)
Lymphocytes Relative: 22 %
Lymphs Abs: 1.5 10*3/uL (ref 0.7–4.0)
MCH: 32.4 pg (ref 26.0–34.0)
MCHC: 33.5 g/dL (ref 30.0–36.0)
MCV: 96.6 fL (ref 78.0–100.0)
Monocytes Absolute: 0.3 10*3/uL (ref 0.1–1.0)
Monocytes Relative: 4 %
Neutro Abs: 4.8 10*3/uL (ref 1.7–7.7)
Neutrophils Relative %: 73 %
Platelets: 224 10*3/uL (ref 150–400)
RBC: 3.55 MIL/uL — ABNORMAL LOW (ref 4.22–5.81)
RDW: 13.4 % (ref 11.5–15.5)
WBC: 6.6 10*3/uL (ref 4.0–10.5)

## 2017-05-19 LAB — GLUCOSE, CAPILLARY: Glucose-Capillary: 198 mg/dL — ABNORMAL HIGH (ref 65–99)

## 2017-05-19 LAB — COMPREHENSIVE METABOLIC PANEL
ALT: 34 U/L (ref 17–63)
AST: 25 U/L (ref 15–41)
Albumin: 3.1 g/dL — ABNORMAL LOW (ref 3.5–5.0)
Alkaline Phosphatase: 88 U/L (ref 38–126)
Anion gap: 9 (ref 5–15)
BUN: 9 mg/dL (ref 6–20)
CO2: 25 mmol/L (ref 22–32)
Calcium: 9 mg/dL (ref 8.9–10.3)
Chloride: 101 mmol/L (ref 101–111)
Creatinine, Ser: 0.95 mg/dL (ref 0.61–1.24)
GFR calc Af Amer: >60 mL/min (ref >60)
GFR calc non Af Amer: >60 mL/min (ref >60)
Glucose, Bld: 220 mg/dL — ABNORMAL HIGH (ref 65–99)
Potassium: 4.4 mmol/L (ref 3.5–5.1)
Sodium: 135 mmol/L (ref 135–145)
Total Bilirubin: 0.7 mg/dL (ref 0.3–1.2)
Total Protein: 6.3 g/dL — ABNORMAL LOW (ref 6.5–8.1)

## 2017-05-19 MED ORDER — PANTOPRAZOLE SODIUM 40 MG PO PACK
40.0000 mg | PACK | Freq: Every day | ORAL | Status: DC
  Administered 2017-05-20 – 2017-05-22 (×3): 40 mg
  Filled 2017-05-19 (×2): qty 20

## 2017-05-19 NOTE — Plan of Care (Signed)
LTGs established 05/19/17

## 2017-05-19 NOTE — Evaluation (Addendum)
Physical Therapy Assessment and Plan  Patient Details  Name: Brendan [name] MRN: [id number] Date of Birth: [birth year]  PT Diagnosis: Ataxia, Ataxic gait, Hemiparesis dominant, Impaired cognition, Impaired sensation and Muscle weakness Rehab Potential: Fair ELOS: 21 to 24    Today's Date: 05/19/2017 PT Individual Time: 1300-1405 PT Individual Time Calculation (min): 65 min   10 minutes missed secondary to fatigue  Problem List:  Patient Active Problem List   Diagnosis Date Noted  . Intraparenchymal hemorrhage of brain ([place]) 05/18/2017  . Tremors of nervous system 05/18/2017  . Right sided weakness   . Aphasia   . Intracerebral hemorrhage 05/16/2017  . Cerebral edema (H[facility])   . Weakness 05/15/2017  . Coronary artery disease involving native coronary artery of native heart without angina pectoris   . Glaucoma   . Anxiety state   . Cognitive disorder   . Urinary retention   . Traumatic hemorrhage of left cerebrum without loss of consciousness ([place])   . Pressure injury of skin 05/05/2017  . Seborrheic dermatitis of scalp 10/05/2016  . Tremor, essential 10/03/2016  . Cough 06/27/2016  . Rhinitis, allergic 01/12/2014  . CHRONIC STABLE ANGINA 08/06/2013  . Obesity (BMI 30-39.9) 01/30/2013  . Lower extremity edema 01/30/2013  . S/P CABG x 3   . Atherosclerotic heart disease of native coronary artery with angina pectoris ([place])   . Hyperlipidemia   . Memory problem 07/01/2012  . RSD (reflex sympathetic dystrophy) 05/24/2012  . Pleural plaque with presence of asbestos 05/15/2012  . Anemia, B12 deficiency 03/19/2012  . Atherosclerotic heart disease of artery bypass graft 09/16/2011  . BPH (benign prostatic hyperplasia) 08/08/2011  . Microscopic hematuria 02/27/2011  . Anxiety 10/03/2010  . Incontinence 07/21/2010  . ANGINA, STABLE 04/29/2008  . GERD 10/28/2007  . Essential tremor 08/30/2006  . Essential hypertension 07/06/2006    Past Medical History:  Past Medical History:   Diagnosis Date  . Anxiety disorder   . Benign enlargement of prostate   . CAD in native artery 1997   Referred for CABG x3 in 2004 for LAD diagonal bifurcation lesion  . Cervical spinal stenosis   . Complex regional pain syndrome of right upper extremity    Right wrist; L arm  . Coronary atherosclerosis of artery bypass graft June 2013   Occluded SVG-D1; Cardiologist Dr. Ellyn Hack  . Gait disorder   . Gastroesophageal reflux disease   . Glaucoma   . Hiatal hernia    GI: Dr Penelope Coop  . Hyperlipidemia LDL goal <70   . Hypertension   . Memory loss    Mild  . Obesity   . Peripheral neuropathy    possible peripheral neuropathy  . S/P CABG x 3 2004    LIMA-LAD, SVG to diagonal, SVG to OM  . Tremor, essential    On Primidone  . Unstable angina pectoris [facility])  June 2013   Cardiac cath: occluded SVG-DI. Patent LIMA-LAD and SVG-OM. EF 45% with apical inferior HK.   Past Surgical History:  Past Surgical History:  Procedure Laterality Date  . CARDIAC CATHETERIZATION    . CATARACT EXTRACTION, BILATERAL    . CORONARY ARTERY BYPASS GRAFT  06/2002   LIMA-LAD, SVG-D1, SVG-OM  . EYE SURGERY    . KNEE SURGERY Left   . LEFT HEART CATHETERIZATION WITH CORONARY/GRAFT ANGIOGRAM N/A 10/13/2011   Procedure: LEFT HEART CATHETERIZATION WITH Beatrix Fetters;  Surgeon: Leonie Man, MD;  Location: [facility] CATH LAB;  Service: Cardiovascular;  Laterality: N/A;  . MINOR PLACEMENT  OF FIDUCIAL N/A 04/26/2017   Procedure: Fiducial placement;  Surgeon: Stern, Joseph, MD;  Location: [facility] OR;  Service: Neurosurgery;  Laterality: N/A;  Fiducial placement  . NM MYOCAR PERF WALL MOTION  03/23/2009   protocol:[place], normal perfusion in all regions, post-stress EF 72%, exercise capacity 7METS, EKG negative for ischemia.  . Shoulder Orthoscopic Surgery   06/2003  . SUBTHALAMIC STIMULATOR INSERTION Bilateral 05/04/2017   Procedure: Bilateral Deep brain stimulator placement;  Surgeon: Stern, Joseph, MD;  Location: [facility]  OR;  Service: Neurosurgery;  Laterality: Bilateral;  Bilateral deep brain stimulator placement  . TRANSTHORACIC ECHOCARDIOGRAM  April 2014   EF 55-60%; no regional wall motion abnormalities. Relatively normal study normal diastolic parameters/abnormal relaxation  . wrist ganglion cyst Left     Assessment & Plan Clinical Impression: Patient is a 77 y.o.right handedmalewith history of CAD with stenting as well as CABG, hypertension, memory loss and bilateral deep brain stimulator electrode placement per Dr. Stern 05/04/2017 for essential tremors. Prior to need for skilled nursing facility placement patient lived with spouse. Needed assistance with self feeding and ADLs due to tremors.Family did encourage the use of a rolling walker but patient refused.Patient was discharged to [facility] skilled nursing facility 05/11/2017 ambulating minimal assist level. Presented 05/15/2017 with increasing right sided weakness. CT of the head showed interval evolution of recently seen intraparenchymal hematoma positioned within the left frontal lobe along the left deep brain stimulator with increased regional edema Negative CTA.EEG moderate diffuse slowing no seizure activity. Echocardiogramwith ejection fraction of 60% no wall motion abnormalities.Follow-up MRI 05/17/2017 with small postoperative hematoma after deep brain stimulator implant and continue to monitor.Maintained on a regular diet.formal physical and occupational therapy evaluations pending.   Patient transferred to CIR on 05/18/2017 .   Patient currently requires max with mobility secondary to muscle weakness, decreased cardiorespiratoy endurance, ataxia and decreased coordination, decreased attention to right and decreased safety awareness and decreased memory.  Prior to hospitalization, patient was modified independent  with mobility and lived with Spouse in a House home.  Home access is 4Stairs to enter.  Patient will benefit from skilled PT intervention  to maximize safe functional mobility, minimize fall risk and decrease caregiver burden for planned discharge home with 24 hour assist.  Anticipate patient will benefit from follow up HH at discharge.  PT - End of Session Activity Tolerance: Tolerates 30+ min activity with multiple rests Endurance Deficit: Yes PT Assessment Rehab Potential (ACUTE/IP ONLY): Fair PT Barriers to Discharge: Inaccessible home environment;Decreased caregiver support PT Patient demonstrates impairments in the following area(s): Balance;Endurance;Motor;Safety PT Transfers Functional Problem(s): Bed Mobility;Bed to Chair;Car PT Locomotion Functional Problem(s): Ambulation;Wheelchair Mobility;Stairs PT Plan PT Intensity: Minimum of 1-2 x/day ,45 to 90 minutes PT Frequency: 5 out of 7 days PT Duration Estimated Length of Stay: 21 to 24  PT Treatment/Interventions: Ambulation/gait training;Balance/vestibular training;Community reintegration;DME/adaptive equipment instruction;Functional electrical stimulation;Neuromuscular re-education;Functional mobility training;Patient/family education;Stair training;Splinting/orthotics;Therapeutic Activities;Therapeutic Exercise;UE/LE Coordination activities;UE/LE Strength taining/ROM;Wheelchair propulsion/positioning PT Transfers Anticipated Outcome(s): min A transfers PT Locomotion Anticipated Outcome(s): min A gait, stairs, S w/c mobility PT Recommendation Follow Up Recommendations: Home health PT Patient destination: Home Equipment Recommended: To be determined  Skilled Therapeutic Intervention PT evaluation completed and treatment plan initiated. Pt performed multiple sit to stand transfers with mod A and max A for stand pivot transfers with verbal and tactile cues. Pt left sitting up in bed with call bell within reach at end of treatment.   PT Evaluation Precautions/Restrictions Precautions Precautions: Fall Precaution Comments: Lt gaze preference, Rt   hemi, aphasic, R  inattention Restrictions Weight Bearing Restrictions: No General Chart Reviewed: Yes Family/Caregiver Present: Yes Vital Signs Pain Pain Assessment Pain Assessment: 0-10 Pain Score: 0-No pain Home Living/Prior Functioning Home Living Available Help at Discharge: Family;Available 24 hours/day Type of Home: House Home Access: Stairs to enter [employer] of Steps: 4 Entrance Stairs-Rails: Right;Left(4 steps up to deck and 2" sill into house) Home Layout: One level  Lives With: Spouse Prior Function Level of Independence: Independent with gait;Independent with transfers  Able to Take Stairs?: Yes 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 Vision/Perception  Vision - Assessment Additional Comments: L gaze preference Perception Perception: Impaired Inattention/Neglect: Does not attend to right visual field;Does not attend to right side of body Cognition Overall Cognitive Status: Impaired/Different from baseline Arousal/Alertness: Awake/alert Orientation Level: (difficult to determine due to aphasia) Attention: Sustained Sustained Attention: Impaired Sustained Attention Impairment: Verbal basic;Functional basic Memory: Impaired Memory Impairment: Decreased recall of new information Awareness: Impaired Awareness Impairment: Intellectual impairment Problem Solving: Impaired Problem Solving Impairment: Verbal basic Executive Function: (All areas impacted by lower level deficits) Safety/Judgment: Impaired Sensation Sensation Light Touch: Impaired Detail Light Touch Impaired Details: Impaired RLE(R LE ) Stereognosis: Not tested Hot/Cold: Not tested Proprioception: Impaired by gross assessment(R LE ) Coordination Gross Motor Movements are Fluid and Coordinated: No Fine Motor Movements are Fluid and Coordinated: No Coordination and Movement Description: hemiparesis R side, tremors Finger Nose Finger Test: Unable to achieve with Rt UE Motor  Motor Motor:  Abnormal tone;Ataxia;Abnormal postural alignment and control(hemiparesis) Motor - Skilled Clinical Observations: Rt lean while sitting and standing during self care tasks, flaccid Rt UE, tremulous   Mobility Bed Mobility Bed Mobility: Supine to Sit;Sit to Supine Supine to Sit: 2: Max assist Sit to Supine: 2: Max assist Transfers Transfers: Yes Sit to Stand: 3: Mod assist Sit to Stand Details: Tactile cues for posture;Manual facilitation for weight shifting;Verbal cues for precautions/safety;Manual facilitation for placement;Manual facilitation for weight bearing Stand to Sit: 3: Mod assist Stand to Sit Details (indicate cue type and reason): Tactile cues for posture;Manual facilitation for weight shifting;Manual facilitation for weight bearing;Visual cues/gestures for precautions/safety;Manual facilitation for placement Stand Pivot Transfers: 2: Max assist Locomotion  Ambulation Ambulation: Yes Ambulation/Gait Assistance: 2: Max assist Ambulation Distance (Feet): 3 Feet Assistive device: None Stairs / Additional Locomotion Stairs: Yes Stairs Assistance: Not tested (comment)(secondary to fatigue) Wheelchair Mobility Wheelchair Mobility: Yes Wheelchair Assistance: Not tested (comment)(secondary to fatigue)  Trunk/Postural Assessment  Cervical Assessment Cervical Assessment: Exceptions to [facility] Thoracic Assessment Thoracic Assessment: Exceptions to [facility] Lumbar Assessment Lumbar Assessment: Exceptions to [facility] Postural Control Postural Control: Deficits on evaluation  Balance Balance Balance Assessed: Yes Static Sitting Balance Static Sitting - Balance Support: Feet supported Static Sitting - Level of Assistance: 5: Stand by assistance Dynamic Sitting Balance Dynamic Sitting - Level of Assistance: 3: Mod assist Sitting balance - Comments: Able to maintain balance with min guard for ~20 minutes, for remainder of session required Mod A due to fatigue Static Standing Balance Static  Standing - Balance Support: During functional activity Static Standing - Level of Assistance: 3: Mod assist Dynamic Standing Balance Dynamic Standing - Balance Support: During functional activity Dynamic Standing - Level of Assistance: 2: Max assist Dynamic Standing - Balance Activities: Lateral lean/weight shifting;Forward lean/weight shifting Dynamic Standing - Comments: Perihygiene during bathing  Extremity Assessment B UEs as per OT evaluation.   RLE Assessment RLE Assessment: Exceptions to [facility] RLE AROM (degrees) Overall AROM Right Lower Extremity: Within functional limits for tasks assessed  RLE Overall AROM Comments: WFLs except DF 10 from neutral RLE Strength RLE Overall Strength: Deficits RLE Overall Strength Comments: grossly 3-/5 LLE Assessment LLE Assessment: Exceptions to WFL LLE AROM (degrees) Overall AROM Left Lower Extremity: Within functional limits for tasks assessed LLE Overall AROM Comments: except DF 10 from neutral LLE Strength LLE Overall Strength: Deficits LLE Overall Strength Comments: grossly 3 to 4/5   See Function Navigator for Current Functional Status.   Refer to Care Plan for Long Term Goals  Recommendations for other services: None   Discharge Criteria: Patient will be discharged from PT if patient refuses treatment 3 consecutive times without medical reason, if treatment goals not met, if there is a change in medical status, if patient makes no progress towards goals or if patient is discharged from hospital.  The above assessment, treatment plan, treatment alternatives and goals were discussed and mutually agreed upon: by patient and by family  ,  G 05/19/2017, 5:14 PM  

## 2017-05-19 NOTE — Evaluation (Signed)
Occupational Therapy Assessment and Plan  Patient Details  Name: DAVIYON [name] MRN: [id number] Date of Birth: [birth year]  OT Diagnosis: abnormal posture, apraxia, cognitive deficits, flaccid hemiplegia and hemiparesis, muscle weakness (generalized), swelling of limb and coordination disorder Rehab Potential: Rehab Potential (ACUTE ONLY): Good ELOS: 3-4 weeks   Today's Date: 05/19/2017 OT Individual Time: 7322-0254 OT Individual Time Calculation (min): 60 min     Problem List:  Patient Active Problem List   Diagnosis Date Noted  . Intraparenchymal hemorrhage of brain ([place]) 05/18/2017  . Tremors of nervous system 05/18/2017  . Right sided weakness   . Aphasia   . Intracerebral hemorrhage 05/16/2017  . Cerebral edema ([facility])   . Weakness 05/15/2017  . Coronary artery disease involving native coronary artery of native heart without angina pectoris   . Glaucoma   . Anxiety state   . Cognitive disorder   . Urinary retention   . Traumatic hemorrhage of left cerebrum without loss of consciousness ([place])   . Pressure injury of skin 05/05/2017  . Seborrheic dermatitis of scalp 10/05/2016  . Tremor, essential 10/03/2016  . Cough 06/27/2016  . Rhinitis, allergic 01/12/2014  . CHRONIC STABLE ANGINA 08/06/2013  . Obesity (BMI 30-39.9) 01/30/2013  . Lower extremity edema 01/30/2013  . S/P CABG x 3   . Atherosclerotic heart disease of native coronary artery with angina pectoris ([place])   . Hyperlipidemia   . Memory problem 07/01/2012  . RSD (reflex sympathetic dystrophy) 05/24/2012  . Pleural plaque with presence of asbestos 05/15/2012  . Anemia, B12 deficiency 03/19/2012  . Atherosclerotic heart disease of artery bypass graft 09/16/2011  . BPH (benign prostatic hyperplasia) 08/08/2011  . Microscopic hematuria 02/27/2011  . Anxiety 10/03/2010  . Incontinence 07/21/2010  . ANGINA, STABLE 04/29/2008  . GERD 10/28/2007  . Essential tremor 08/30/2006  . Essential hypertension 07/06/2006     Past Medical History:  Past Medical History:  Diagnosis Date  . Anxiety disorder   . Benign enlargement of prostate   . CAD in native artery 1997   Referred for CABG x3 in 2004 for LAD diagonal bifurcation lesion  . Cervical spinal stenosis   . Complex regional pain syndrome of right upper extremity    Right wrist; L arm  . Coronary atherosclerosis of artery bypass graft June 2013   Occluded SVG-D1; Cardiologist Dr. Ellyn Hack  . Gait disorder   . Gastroesophageal reflux disease   . Glaucoma   . Hiatal hernia    GI: Dr Penelope Coop  . Hyperlipidemia LDL goal <70   . Hypertension   . Memory loss    Mild  . Obesity   . Peripheral neuropathy    possible peripheral neuropathy  . S/P CABG x 3 2004    LIMA-LAD, SVG to diagonal, SVG to OM  . Tremor, essential    On Primidone  . Unstable angina pectoris [facility])  June 2013   Cardiac cath: occluded SVG-DI. Patent LIMA-LAD and SVG-OM. EF 45% with apical inferior HK.   Past Surgical History:  Past Surgical History:  Procedure Laterality Date  . CARDIAC CATHETERIZATION    . CATARACT EXTRACTION, BILATERAL    . CORONARY ARTERY BYPASS GRAFT  06/2002   LIMA-LAD, SVG-D1, SVG-OM  . EYE SURGERY    . KNEE SURGERY Left   . LEFT HEART CATHETERIZATION WITH CORONARY/GRAFT ANGIOGRAM N/A 10/13/2011   Procedure: LEFT HEART CATHETERIZATION WITH Beatrix Fetters;  Surgeon: Leonie Man, MD;  Location: [facility] CATH LAB;  Service: Cardiovascular;  Laterality: N/A;  .  MINOR PLACEMENT OF FIDUCIAL N/A 04/26/2017   Procedure: Fiducial placement;  Surgeon: Erline Levine, MD;  Location: [place];  Service: Neurosurgery;  Laterality: N/A;  Fiducial placement  . NM MYOCAR PERF WALL MOTION  03/23/2009   protocol:[place], normal perfusion in all regions, post-stress EF 72%, exercise capacity 7METS, EKG negative for ischemia.  . Shoulder Orthoscopic Surgery   06/2003  . SUBTHALAMIC STIMULATOR INSERTION Bilateral 05/04/2017   Procedure: Bilateral Deep brain stimulator  placement;  Surgeon: Erline Levine, MD;  Location: [facility];  Service: Neurosurgery;  Laterality: Bilateral;  Bilateral deep brain stimulator placement  . TRANSTHORACIC ECHOCARDIOGRAM  April 2014   EF 55-60%; no regional wall motion abnormalities. Relatively normal study normal diastolic parameters/abnormal relaxation  . wrist ganglion cyst Left     Assessment & Plan Clinical Impression: Brendan [name] a 76 y.o.right handedmalewith history of CAD with stenting as well as CABG, hypertension, memory loss and bilateral deep brain stimulator electrode placement per Dr. Vertell Limber 05/04/2017 for essential tremors. Prior to need for skilled nursing facility placement patient lived with spouse. Needed assistance with self feeding and ADLs due to tremors.Family did encourage the use of a rolling walker but patient refused.Patient was discharged to [facility] skilled nursing facility 05/11/2017 ambulating minimal assist level. Presented 05/15/2017 with increasing right sided weakness. CT of the head showed interval evolution of recently seen intraparenchymal hematoma positioned within the left frontal lobe along the left deep brain stimulator with increased regional edema Negative CTA.EEG moderate diffuse slowing no seizure activity. Echocardiogramwith ejection fraction of 60% no wall motion abnormalities.Follow-up MRI 05/17/2017 with small postoperative hematoma after deep brain stimulator implant and continue to monitor.Maintained on a regular diet.formal physical and occupational therapy evaluations pending. M.D. Has requested physical medicine rehabilitation consult.Patient was admitted for a comprehensive rehabilitation program  Patient currently requires max-2 helpers with basic self-care skills secondary to muscle weakness, muscle joint tightness and muscle paralysis, decreased cardiorespiratoy endurance, impaired timing and sequencing, abnormal tone, unbalanced muscle activation, motor apraxia, decreased  coordination and decreased motor planning, decreased midline orientation, decreased attention to right, right side neglect and decreased motor planning, decreased initiation, decreased attention, decreased awareness, decreased problem solving, decreased safety awareness, decreased memory and delayed processing and decreased sitting balance, decreased standing balance, decreased postural control and hemiplegia.  Prior to hospitalization, patient could complete BADLs with min.  Patient will benefit from skilled intervention to increase independence with basic self-care skills prior to discharge to skilled nursing facility or home with care partner.  Anticipate patient will require 24 hour supervision and follow up home health/follow up outpatient or SNF.  OT - End of Session Endurance Deficit: Yes OT Assessment Rehab Potential (ACUTE ONLY): Good OT Barriers to Discharge: Decreased caregiver support;Incontinence;Lack of/limited family support;Medical stability OT Patient demonstrates impairments in the following area(s): Balance;Perception;Cognition;Safety;Edema;Sensory;Endurance;Skin Integrity;Motor;Vision OT Basic ADL's Functional Problem(s): Eating;Grooming;Bathing;Dressing;Toileting OT Advanced ADL's Functional Problem(s): Simple Meal Preparation OT Transfers Functional Problem(s): Toilet;Tub/Shower OT Additional Impairment(s): Fuctional Use of Upper Extremity OT Plan OT Intensity: Minimum of 1-2 x/day, 45 to 90 minutes OT Frequency: 5 out of 7 days OT Duration/Estimated Length of Stay: 3-4 weeks OT Treatment/Interventions: Balance/vestibular training;Discharge planning;Functional electrical stimulation;Pain management;Self Care/advanced ADL retraining;Therapeutic Activities;UE/LE Coordination activities;Cognitive remediation/compensation;Disease mangement/prevention;Functional mobility training;Skin care/wound managment;Patient/family education;Therapeutic Exercise;Visual/perceptual  remediation/compensation;Wheelchair propulsion/positioning;UE/LE Strength taining/ROM;Splinting/orthotics;Psychosocial support;Neuromuscular re-education;DME/adaptive equipment instruction;Community reintegration OT Self Feeding Anticipated Outcome(s): No goal OT Basic Self-Care Anticipated Outcome(s): Min-Mod A OT Toileting Anticipated Outcome(s): Mod A OT Bathroom Transfers Anticipated Outcome(s): Min A OT Recommendation Recommendations for Other Services:  Therapeutic Recreation consult Therapeutic Recreation Interventions: Pet therapy Patient destination: Home Follow Up Recommendations: Home health OT;Outpatient OT;Skilled nursing facility;24 hour supervision/assistance Equipment Recommended: To be determined   Skilled Therapeutic Intervention Skilled OT session completed with focus on initial evaluation, education on OT role/POC, and establishment of patient-centered goals.   Pt greeted via SLP handoff in bed. Dtr Lovey Newcomer present. He transitioned to EOB with Mod A for guiding LEs and elevating trunk. Pt with Rt lean while sitting, able to maintain balance with min guard for first 20 minutes, increasing to Mod A after that due to fatigue. Max multimodal cues required for initiation, attention, sequencing, awareness, and Rt attention during bathing tasks. Pt with Lt gaze preference and minimal scanning to Rt of midline. Sit<stand with Mod A with R UE supported, no buckling observed R LE however R UE flaccid and unable to maintain grip on DME handle. Manual cues provided to facilitate upright alignment and forward gaze. 2 helpers Mod A for LB self care for safety. He was able to initiate proper use of clothing once handed to him, however unable to problem solve or acknowledge dressing Rt side without cues. Increased tremor activity with L UE at times. Dtr present and providing OT with reliable PLOF, pertinent d/c information, and occupational hx. Pt aphasic, often responded with "I don't know" and  unable to provide this information himself. He was able to consistently follow 1 step instruction. At end of tx he completed stand pivot<recliner with Mod A x2 helpers for safety. Once he was repositioned for comfort, NT reported that he was needed in bed for xray. Therefore pt transferred back to bed (to Rt side) with Mod A x2, with increased need for cuing and manual facilitation for weight shifting. Once he was returned to supine, pt was taken by xray transporter.    OT Evaluation Precautions/Restrictions  Precautions Precautions: Fall Precaution Comments: Lt gaze preference, Rt hemi, aphasic  Restrictions Weight Bearing Restrictions: No General Chart Reviewed: Yes Family/Caregiver Present: Yes(Dtr [name]) Vital Signs Therapy Vitals Temp: 98.2 F (36.8 C) Temp Source: Oral Pulse Rate: (!) 57 Resp: 20 BP: 128/71 Patient Position (if appropriate): Lying Oxygen Therapy SpO2: 98 % O2 Device: Not Delivered Pain Pain Assessment Pain Assessment: 0-10 Pain Score: 0-No pain Home Living/Prior Functioning Home Living Available Help at Discharge: Family, Available 24 hours/day Type of Home: House Entrance Stairs-Number of Steps: 4 Home Layout: One level Bathroom Shower/Tub: Tub/shower unit, Multimedia programmer: Standard Bathroom Accessibility: No Additional Comments: Per dtr, pt was admitted to [facility] for a few days in January. Prior to admission, he was "furniture walking" in home, with supervision-Min A from spouse for self feeding and ADLs due to tremors  Lives With: Spouse IADL History Homemaking Responsibilities: No Occupation: Retired Type of Occupation: Secondary school teacher for [employer] and Hobbies: Spending time with his dog  Prior Function Vocation: Retired ADL ADL ADL Comments: Please see functional navigator for ADL status Vision Baseline Vision/History: Glaucoma Vision Assessment?: Vision impaired- to be further tested in functional  context Additional Comments: Lt gaze preference Perception  Perception: Impaired Inattention/Neglect: Does not attend to right visual field;Does not attend to right side of body Praxis Praxis: Impaired Praxis Impairment Details: Initiation;Motor planning;Perseveration Praxis-Other Comments: During bathing + dressing tasks Cognition Overall Cognitive Status: Impaired/Different from baseline Arousal/Alertness: Awake/alert Orientation Level: Person Memory: Impaired Immediate Memory Recall: (0/3 recall) Memory Recall: (0/3 recall) Attention: Sustained Sustained Attention: Impaired Awareness: Impaired Awareness Impairment: Intellectual impairment Problem Solving: Impaired Safety/Judgment:  Impaired Sensation Sensation Light Touch: Impaired Detail Light Touch Impaired Details: Impaired RUE(difficult to thoroughly assess due to cognition) Stereognosis: Not tested Hot/Cold: Not tested Proprioception: Impaired by gross assessment Coordination Gross Motor Movements are Fluid and Coordinated: No Fine Motor Movements are Fluid and Coordinated: No Coordination and Movement Description: Affected by hemiparesis on Rt side + tremor activity  Finger Nose Finger Test: Unable to achieve with Rt UE Motor  Motor Motor: Abnormal tone;Abnormal postural alignment and control;Hemiplegia Motor - Skilled Clinical Observations: Rt lean while sitting and standing during self care tasks, flaccid Rt UE, tremor activity L UE Mobility  Transfers Transfers: Sit to Stand;Stand to Sit Sit to Stand: 3: Mod assist;From elevated surface;From bed Sit to Stand Details: Tactile cues for posture;Manual facilitation for weight shifting;Verbal cues for precautions/safety;Manual facilitation for placement;Manual facilitation for weight bearing Stand to Sit: 3: Mod assist Stand to Sit Details (indicate cue type and reason): Tactile cues for posture;Manual facilitation for weight shifting;Manual facilitation for weight  bearing;Visual cues/gestures for precautions/safety;Manual facilitation for placement  Trunk/Postural Assessment  Cervical Assessment Cervical Assessment: Exceptions to WFL(Forward head, flexed) Thoracic Assessment Thoracic Assessment: (Rounded shoulders, kyphosis, downward rotation of Rt scapula) Lumbar Assessment Lumbar Assessment: Exceptions to WFL(posterior pelvic tilt) Postural Control Postural Control: Deficits on evaluation(Delayed righting reactions when losing balance EOB during self care)  Balance Balance Balance Assessed: Yes Dynamic Sitting Balance Dynamic Sitting - Level of Assistance: 3: Mod assist Sitting balance - Comments: Able to maintain balance with min guard for ~20 minutes, for remainder of session required Mod A due to fatigue Dynamic Standing Balance Dynamic Standing - Balance Support: Right upper extremity supported Dynamic Standing - Level of Assistance: 3: Mod assist Dynamic Standing - Balance Activities: Lateral lean/weight shifting;Forward lean/weight shifting Dynamic Standing - Comments: Perihygiene during bathing  Extremity/Trunk Assessment RUE Assessment RUE Assessment: Exceptions to WFL(Brunnstrom Stage 1) LUE Assessment LUE Assessment: Within Functional Limits   See Function Navigator for Current Functional Status.   Refer to Care Plan for Long Term Goals  Recommendations for other services: Therapeutic Recreation  Pet therapy   Discharge Criteria: Patient will be discharged from OT if patient refuses treatment 3 consecutive times without medical reason, if treatment goals not met, if there is a change in medical status, if patient makes no progress towards goals or if patient is discharged from hospital.  The above assessment, treatment plan, treatment alternatives and goals were discussed and mutually agreed upon: by patient and by family  Skeet Simmer 05/19/2017, 4:55 PM

## 2017-05-19 NOTE — Progress Notes (Signed)
Subjective/Complaints: Coughing with water, denies SOB  ROS- denies CP, SOB, N/V/D  Question accuracy due to aphasia  Objective: Vital Signs: Blood pressure (!) 146/64, pulse (!) 59, temperature 98 F (36.7 C), temperature source Oral, resp. rate 20, height '5\' 9"'  (1.753 m), weight 92 kg (202 lb 12.8 oz), SpO2 100 %. No results found. Results for orders placed or performed during the hospital encounter of 05/18/17 (from the past 72 hour(s))  Basic metabolic panel     Status: Abnormal   Collection Time: 05/18/17  5:09 PM  Result Value Ref Range   Sodium 136 135 - 145 mmol/L   Potassium 4.1 3.5 - 5.1 mmol/L   Chloride 100 (L) 101 - 111 mmol/L   CO2 25 22 - 32 mmol/L   Glucose, Bld 162 (H) 65 - 99 mg/dL   BUN 9 6 - 20 mg/dL   Creatinine, Ser 0.81 0.61 - 1.24 mg/dL   Calcium 8.7 (L) 8.9 - 10.3 mg/dL   GFR calc non Af Amer >60 >60 mL/min   GFR calc Af Amer >60 >60 mL/min    Comment: (NOTE) The eGFR has been calculated using the CKD EPI equation. This calculation has not been validated in all clinical situations. eGFR's persistently <60 mL/min signify possible Chronic Kidney Disease.    Anion gap 11 5 - 15    Comment: Performed at [facility] [address], [place], [place] [address]     HEENT: normal Cardio: RRR and no murmur Resp: CTA B/L and unlabored GI: BS positive and NT, ND Extremity:  Pulses positive and No Edema Skin:   Intact Neuro: Confused, Flat, Abnormal Sensory unable to assess due to cognition, Abnormal Motor 0/5 RUE, Abnormal FMC Ataxic/ dec FMC, Dysarthric, Aphasic and Other able to name simple objects, cannot recall name of daughter Musc/Skel:  Other no pain with UE or LE ROM General no acute distress   Assessment/Plan: 1. Functional deficits secondary to left frontal parietal intraparenchymal hemorrhage with right hemiparesis, aphasia and cognitive deficits which require 3+ hours per day of interdisciplinary therapy in a comprehensive inpatient  rehab setting. Physiatrist is providing close team supervision and 24 hour management of active medical problems listed below. Physiatrist and rehab team continue to assess barriers to discharge/monitor patient progress toward functional and medical goals. FIM:                                  Medical Problem List and Plan: 1.Decreased functional mobilitysecondary to left frontal intraparenchymal hemorrhage with recent deep brain stimulator for tremor. l -CIR PT, OT, SLP evals today 2. DVT Prophylaxis/Anticoagulation: SCDs. Monitor for any signs of DVT 3. Pain Management:Neurontin 300 mg 3 times a day, Tylenol as needed 4. Mood:Provide emotional support 5. Neuropsych: This patientisnot consistentlycapable of making decisions onhisown behalf. -monitor for ongoing lethargy -follow sleep chart -limit neuro-sedating meds when possible, further work up if necessary 6. Skin/Wound Care:Routine skin checks 7. Fluids/Electrolytes/Nutrition:Routine I&O's with follow-up chemistries 8.Hypertension. Cozaar 100 mg daily, Lasix 20 mg daily. Vitals:   05/18/17 2058 05/19/17 0515  BP: (!) 151/60 (!) 146/64  Pulse: 67 (!) 59  Resp:  20  Temp:  98 F (36.7 C)  SpO2:  100%  Monitor for now 9.CAD with stenting/CABG. No chest pain or shortness of breath 10.Hyperlipidemia. Lipitor,Lovaza' 11. Tremors: -mysoline and inderal ongoing -simulator not operational yet 12.  Dysphagia with cough on thin liq but will  have SLP re eval today, check CXR   LOS (Days) 1 A FACE TO [place] E Mayline Dragon 05/19/2017, 8:07 AM

## 2017-05-19 NOTE — Evaluation (Signed)
Speech Language Pathology Assessment and Plan  Patient Details  Name: Brendan [name] MRN: [id number] Date of Birth: 02-07-1941  SLP Diagnosis: Aphasia;Dysarthria;Cognitive Impairments;Dysphagia  Rehab Potential: Good ELOS: 21 to 28 days    Today's Date: 05/19/2017  Treatment session #1 -  SLP Individual Time: 4944-9675 SLP Individual Time Calculation (min): 45 min  Treatment session #2 SLP Individual Time: 9163-8466 SLP Individual Time Calculation (min): 15 min   Problem List:  Patient Active Problem List   Diagnosis Date Noted  . Intraparenchymal hemorrhage of brain ([place]) 05/18/2017  . Tremors of nervous system 05/18/2017  . Right sided weakness   . Aphasia   . Intracerebral hemorrhage 05/16/2017  . Cerebral edema (H[facility])   . Weakness 05/15/2017  . Coronary artery disease involving native coronary artery of native heart without angina pectoris   . Glaucoma   . Anxiety state   . Cognitive disorder   . Urinary retention   . Traumatic hemorrhage of left cerebrum without loss of consciousness ([place])   . Pressure injury of skin 05/05/2017  . Seborrheic dermatitis of scalp 10/05/2016  . Tremor, essential 10/03/2016  . Cough 06/27/2016  . Rhinitis, allergic 01/12/2014  . CHRONIC STABLE ANGINA 08/06/2013  . Obesity (BMI 30-39.9) 01/30/2013  . Lower extremity edema 01/30/2013  . S/P CABG x 3   . Atherosclerotic heart disease of native coronary artery with angina pectoris ([place])   . Hyperlipidemia   . Memory problem 07/01/2012  . RSD (reflex sympathetic dystrophy) 05/24/2012  . Pleural plaque with presence of asbestos 05/15/2012  . Anemia, B12 deficiency 03/19/2012  . Atherosclerotic heart disease of artery bypass graft 09/16/2011  . BPH (benign prostatic hyperplasia) 08/08/2011  . Microscopic hematuria 02/27/2011  . Anxiety 10/03/2010  . Incontinence 07/21/2010  . ANGINA, STABLE 04/29/2008  . GERD 10/28/2007  . Essential tremor 08/30/2006  . Essential hypertension  07/06/2006   Past Medical History:  Past Medical History:  Diagnosis Date  . Anxiety disorder   . Benign enlargement of prostate   . CAD in native artery 1997   Referred for CABG x3 in 2004 for LAD diagonal bifurcation lesion  . Cervical spinal stenosis   . Complex regional pain syndrome of right upper extremity    Right wrist; L arm  . Coronary atherosclerosis of artery bypass graft June 2013   Occluded SVG-D1; Cardiologist Dr. Ellyn Hack  . Gait disorder   . Gastroesophageal reflux disease   . Glaucoma   . Hiatal hernia    GI: Dr Penelope Coop  . Hyperlipidemia LDL goal <70   . Hypertension   . Memory loss    Mild  . Obesity   . Peripheral neuropathy    possible peripheral neuropathy  . S/P CABG x 3 2004    LIMA-LAD, SVG to diagonal, SVG to OM  . Tremor, essential    On Primidone  . Unstable angina pectoris [facility])  June 2013   Cardiac cath: occluded SVG-DI. Patent LIMA-LAD and SVG-OM. EF 45% with apical inferior HK.   Past Surgical History:  Past Surgical History:  Procedure Laterality Date  . CARDIAC CATHETERIZATION    . CATARACT EXTRACTION, BILATERAL    . CORONARY ARTERY BYPASS GRAFT  06/2002   LIMA-LAD, SVG-D1, SVG-OM  . EYE SURGERY    . KNEE SURGERY Left   . LEFT HEART CATHETERIZATION WITH CORONARY/GRAFT ANGIOGRAM N/A 10/13/2011   Procedure: LEFT HEART CATHETERIZATION WITH Beatrix Fetters;  Surgeon: Leonie Man, MD;  Location[facility] C[facility] LAB;  Service: Cardiovascular;  Laterality: N/A;  . MINOR PLACEMENT OF FIDUCIAL N/A 04/26/2017   Procedure: Fiducial placement;  Surgeon: Erline Levine, MD;  Location: [place];  Service: Neurosurgery;  Laterality: N/A;  Fiducial placement  . NM MYOCAR PERF WALL MOTION  03/23/2009   protocol:[place], normal perfusion in all regions, post-stress EF 72%, exercise capacity 7METS, EKG negative for ischemia.  . Shoulder Orthoscopic Surgery   06/2003  . SUBTHALAMIC STIMULATOR INSERTION Bilateral 05/04/2017   Procedure: Bilateral Deep brain  stimulator placement;  Surgeon: Erline Levine, MD;  Location: [place];  Service: Neurosurgery;  Laterality: Bilateral;  Bilateral deep brain stimulator placement  . TRANSTHORACIC ECHOCARDIOGRAM  April 2014   EF 55-60%; no regional wall motion abnormalities. Relatively normal study normal diastolic parameters/abnormal relaxation  . wrist ganglion cyst Left     Assessment / Plan / Recommendation Clinical Impression MARCELLA [name] a 77 y.o.right handedmalewith history of CAD with stenting as well as CABG, hypertension, memory loss and bilateral deep brain stimulator electrode placement per Dr. Vertell Limber 05/04/2017 for essential tremors. Prior to need for skilled nursing facility placement patient lived with spouse. Needed assistance with self feeding and ADLs due to tremors.Family did encourage the use of a rolling walker but patient refused.Patient was discharged to [facility] skilled nursing facility 05/11/2017 ambulating minimal assist level. Presented 05/15/2017 with increasing right sided weakness. CT of the head showed interval evolution of recently seen intraparenchymal hematoma positioned within the left frontal lobe along the left deep brain stimulator with increased regional edema Negative CTA.EEG moderate diffuse slowing no seizure activity. Echocardiogramwith ejection fraction of 60% no wall motion abnormalities.Follow-up MRI 05/17/2017 with small postoperative hematoma after deep brain stimulator implant and continue to monitor.Maintained on a regular diet.formal physical and occupational therapy evaluations pending. M.D. Has requested physical medicine rehabilitation consult.Patient was admitted for a comprehensive rehabilitation program on 05/18/17.   Daughter present during Brendan [name] evaluation on 05/19/17 and reports pt iwth new cough during PO consumption. Therefore bedside swallow evaluation performed. Pt with oral holding of ice chips, thin liquids and necatr thick liquids. Pt with appearance of  decreased swallow initiation and possible pharyngeal residue as evidenced by immediate coughing with ice chips and thin liquids and delayed cough with nectar thick liquids. When cued to turn head to right (weak side) with was free of overt s/s of aspiration. Pt with recent chest x-ray (2/219) which was free of pneumonia, therefore I recommend continuing thin liquids, feed pt from right and remind pt to turn head to right. Diet downgraded d/t overall fatigue and inability to effectively masticate pancakes.   Comprehensive cognitive linguistic evaluation also completed. Pt presents with moderate expressive aphasia c/b perseveration and semantic paraphasias. Pt communicates primarily at the word to phrase level. His verbal communication is also further complicated by decreased speech intelligibility mainly related to decreased vocal intensity and imprecise articulation (dysarthria) d/t right facial weakness. He is ~ 50% intelligible at the phrase level. Pt also presents with deficits in cognitive function c/b decreased sustained attention, basic problem solving, answering simple yes/no questions and right attention. Pt requires skilled ST to address the above mentioned deficits increase functional independence and reduce caregiver burden.    Skilled Therapeutic Interventions          Skilled treatment session focused on completion of BSE and SLE, see above. SLP also placed visual reminder (blue tape on right handrail of bed) to assist with head turn to right.    SLP Assessment  Patient will need skilled [facility]  during CIR admission    Recommendations  SLP Diet Recommendations: Dysphagia 2 (Fine chop);Thin Liquid Administration via: Cup;Straw Medication Administration: Crushed with puree Supervision: Staff to assist with self feeding;Full supervision/cueing for compensatory strategies Compensations: Minimize environmental distractions;Slow rate;Small sips/bites;Other  (Comment)(Feed from right, head turn to right) Postural Changes and/or Swallow Maneuvers: Seated upright 90 degrees Oral Care Recommendations: Oral care BID Patient destination: Home Follow up Recommendations: 24 hour supervision/assistance;Home Health SLP Equipment Recommended: None recommended by SLP    SLP Frequency 3 to 5 out of 7 days   SLP Duration  SLP Intensity  SLP Treatment/Interventions 21 to 28 days  Minumum of 1-2 x/day, 30 to 90 minutes  Cognitive remediation/compensation;Cueing hierarchy;Dysphagia/aspiration precaution training;Functional tasks;Patient/family education;Therapeutic Activities;Speech/Language facilitation;Internal/external aids    Pain    Prior Functioning Cognitive/Linguistic Baseline: Within functional limits Type of Home: House  Lives With: Spouse Available Help at Discharge: Family;Available 24 hours/day Vocation: Retired  Function:  Eating Eating   Modified Consistency Diet: Yes Eating Assist Level: Helper feeds patient;Set up assist for;More than reasonable amount of time   Eating Set Up Assist For: Opening containers       Cognition Comprehension Comprehension assist level: Understands basic 25 - 49% of the time/ requires cueing 50 - 75% of the time  Expression   Expression assist level: Expresses basic 25 - 49% of the time/requires cueing 50 - 75% of the time. Uses single words/gestures.  Social Interaction Social Interaction assist level: Interacts appropriately 50 - 74% of the time - May be physically or verbally inappropriate.  Problem Solving Problem solving assist level: Solves basic 25 - 49% of the time - needs direction more than half the time to initiate, plan or complete simple activities  Memory Memory assist level: Recognizes or recalls 25 - 49% of the time/requires cueing 50 - 75% of the time   Short Term Goals: Week 1: SLP Short Term Goal 1 (Week 1): Pt will sustain attention to basic familiar task  for ~ 15 minutes  with Mod A cues.  SLP Short Term Goal 2 (Week 1): Pt will name common objects with ~ 90% accuracy and Min A cues.  SLP Short Term Goal 3 (Week 1): Pt will increase speech intelligibility at the simple phrase level to achieve ~ 75% intelligibility given Mod A cues for use of speech intelligibility strategies.  SLP Short Term Goal 4 (Week 1): Pt will complete basic familiar problem solving tasks related to ADLs with Mod A cues.  SLP Short Term Goal 5 (Week 1): Pt will consume current diet with minimal overt s/s of aspiration and Min A cues for use of compensatory swallow strategies.   Refer to Care Plan for Long Term Goals  Recommendations for other services: None   Discharge Criteria: Patient will be discharged from SLP if patient refuses treatment 3 consecutive times without medical reason, if treatment goals not met, if there is a change in medical status, if patient makes no progress towards goals or if patient is discharged from hospital.  The above assessment, treatment plan, treatment alternatives and goals were discussed and mutually agreed upon: by patient and by family  Jariyah Hackley 05/19/2017, 1:38 PM

## 2017-05-20 ENCOUNTER — Inpatient Hospital Stay (HOSPITAL_COMMUNITY): Payer: PPO | Admitting: Physical Therapy

## 2017-05-20 NOTE — Progress Notes (Signed)
Subjective/Complaints: Appreciate speech eval - needs head turn to R  ROS- denies CP, SOB, N/V/D  Question accuracy due to aphasia  Objective: Vital Signs: Blood pressure (!) 114/44, pulse (!) 58, temperature 98.2 F (36.8 C), temperature source Oral, resp. rate 20, height _0  (1.753 m), weight 92 kg (202 lb 12.8 oz), SpO2 100 %. Dg Chest 2 View  Result Date: 05/19/2017 CLINICAL DATA:  Nonproductive cough. EXAM: CHEST  2 VIEW COMPARISON:  CT chest 06/17/2016 FINDINGS: There is mild bilateral interstitial prominence low lung volumes. There is right upper thoracic pleural based mass better visualize and characterize on CT chest dated 07/14/2016. There is focal pleural thickening along the left lateral chest unchanged from the prior CT chest dated 07/14/2016. There is no focal consolidation. There is no pleural effusion or pneumothorax. The heart mediastinum are stable. There is evidence of prior CABG. The osseous structures are unremarkable. IMPRESSION: No active cardiopulmonary disease. Bilateral focal pleural thickening similar in appearance to the prior CT chest dated 07/14/2016. Electronically Signed   By: Kathreen Devoid   On: 05/19/2017 11:05   Results for orders placed or performed during the hospital encounter of 05/18/17 (from the past 72 hour(s))  Basic metabolic panel     Status: Abnormal   Collection Time: 05/18/17  5:09 PM  Result Value Ref Range   Sodium 136 135 - 145 mmol/L   Potassium 4.1 3.5 - 5.1 mmol/L   Chloride 100 (L) 101 - 111 mmol/L   CO2 25 22 - 32 mmol/L   Glucose, Bld 162 (H) 65 - 99 mg/dL   BUN 9 6 - 20 mg/dL   Creatinine, Ser 0.81 0.61 - 1.24 mg/dL   Calcium 8.7 (L) 8.9 - 10.3 mg/dL   GFR calc non Af Amer >60 >60 mL/min   GFR calc Af Amer >60 >60 mL/min    Comment: (NOTE) The eGFR has been calculated using the CKD EPI equation. This calculation has not been validated in all clinical situations. eGFR's persistently <60 mL/min signify possible Chronic  Kidney Disease.    Anion gap 11 5 - 15    Comment: Performed at [facility] [address] [place], [place] [address]  CBC WITH DIFFERENTIAL     Status: Abnormal   Collection Time: 05/19/17  8:44 AM  Result Value Ref Range   WBC 6.6 4.0 - 10.5 K/uL   RBC 3.55 (L) 4.22 - 5.81 MIL/uL   Hemoglobin 11.5 (L) 13.0 - 17.0 g/dL   HCT 34.3 (L) 39.0 - 52.0 %   MCV 96.6 78.0 - 100.0 fL   MCH 32.4 26.0 - 34.0 pg   MCHC 33.5 30.0 - 36.0 g/dL   RDW 13.4 11.5 - 15.5 %   Platelets 224 150 - 400 K/uL   Neutrophils Relative % 73 %   Neutro Abs 4.8 1.7 - 7.7 K/uL   Lymphocytes Relative 22 %   Lymphs Abs 1.5 0.7 - 4.0 K/uL   Monocytes Relative 4 %   Monocytes Absolute 0.3 0.1 - 1.0 K/uL   Eosinophils Relative 1 %   Eosinophils Absolute 0.1 0.0 - 0.7 K/uL   Basophils Relative 0 %   Basophils Absolute 0.0 0.0 - 0.1 K/uL    Comment: Performed at [facility] [address] [place], [place] [address]  Comprehensive metabolic panel     Status: Abnormal   Collection Time: 05/19/17  8:44 AM  Result Value Ref Range   Sodium 135 135 - 145  mmol/L   Potassium 4.4 3.5 - 5.1 mmol/L   Chloride 101 101 - 111 mmol/L   CO2 25 22 - 32 mmol/L   Glucose, Bld 220 (H) 65 - 99 mg/dL   BUN 9 6 - 20 mg/dL   Creatinine, Ser 0.95 0.61 - 1.24 mg/dL   Calcium 9.0 8.9 - 10.3 mg/dL   Total Protein 6.3 (L) 6.5 - 8.1 g/dL   Albumin 3.1 (L) 3.5 - 5.0 g/dL   AST 25 15 - 41 U/L   ALT 34 17 - 63 U/L   Alkaline Phosphatase 88 38 - 126 U/L   Total Bilirubin 0.7 0.3 - 1.2 mg/dL   GFR calc non Af Amer >60 >60 mL/min   GFR calc Af Amer >60 >60 mL/min    Comment: (NOTE) The eGFR has been calculated using the CKD EPI equation. This calculation has not been validated in all clinical situations. eGFR's persistently <60 mL/min signify possible Chronic Kidney Disease.    Anion gap 9 5 - 15    Comment: Performed at [facility] [address], [place], [place] [address]  Glucose, capillary     Status:  Abnormal   Collection Time: 05/19/17  4:57 PM  Result Value Ref Range   Glucose-Capillary 198 (H) 65 - 99 mg/dL     HEENT: normal Cardio: RRR and no murmur Resp: CTA B/L and unlabored GI: BS positive and NT, ND Extremity:  Pulses positive and No Edema Skin:   Intact Neuro: Confused, Flat, Abnormal Sensory unable to assess due to cognition, Abnormal Motor 0/5 RUE, Abnormal FMC Ataxic/ dec FMC, Dysarthric, Aphasic and Other able to name simple objects, cannot recall name of daughter Musc/Skel:  Other no pain with UE or LE ROM General no acute distress   Assessment/Plan: 1. Functional deficits secondary to left frontal parietal intraparenchymal hemorrhage with right hemiparesis, aphasia and cognitive deficits which require 3+ hours per day of interdisciplinary therapy in a comprehensive inpatient rehab setting. Physiatrist is providing close team supervision and 24 hour management of active medical problems listed below. Physiatrist and rehab team continue to assess barriers to discharge/monitor patient progress toward functional and medical goals. FIM: Function - Bathing Position: Sitting EOB Body parts bathed by patient: Chest, Abdomen, Front perineal area, Left upper leg Body parts bathed by helper: Right arm, Left arm, Buttocks, Right upper leg, Right lower leg, Left lower leg, Back Assist Level: 2 helpers  Function- Upper Body Dressing/Undressing What is the patient wearing?: Button up shirt Button up shirt - Perfomed by helper: Thread/unthread right sleeve, Thread/unthread left sleeve, Pull shirt around back, Button/unbutton shirt Function - Lower Body Dressing/Undressing What is the patient wearing?: Pants, Non-skid slipper socks Position: Sitting EOB Pants- Performed by helper: Thread/unthread right pants leg, Thread/unthread left pants leg, Pull pants up/down Non-skid slipper socks- Performed by helper: Don/doff right sock, Don/doff left sock Assist for footwear:  Dependant Assist for lower body dressing: 2 Helpers  Function - Toileting Toileting activity did not occur: No continent bowel/bladder event     Function - Chair/bed transfer Chair/bed transfer assist level: Maximal assist (Pt 25 - 49%/lift and lower) Chair/bed transfer assistive device: Armrests  Function - Locomotion: Wheelchair Wheelchair activity did not occur: Safety/medical concerns Wheel 50 feet with 2 turns activity did not occur: Safety/medical concerns Wheel 150 feet activity did not occur: Safety/medical concerns Function - Locomotion: Ambulation Assistive device: No device Max distance: 3 Assist level: Maximal assist (Pt 25 - 49%) Walk 10 feet activity did  not occur: Safety/medical concerns Walk 50 feet with 2 turns activity did not occur: Safety/medical concerns Walk 150 feet activity did not occur: Safety/medical concerns Walk 10 feet on uneven surfaces activity did not occur: Safety/medical concerns  Function - Comprehension Comprehension: Auditory Comprehension assist level: Understands basic 25 - 49% of the time/ requires cueing 50 - 75% of the time  Function - Expression Expression: Verbal Expression assist level: Expresses basic 25 - 49% of the time/requires cueing 50 - 75% of the time. Uses single words/gestures.  Function - Social Interaction Social Interaction assist level: Interacts appropriately 50 - 74% of the time - May be physically or verbally inappropriate.  Function - Problem Solving Problem solving assist level: Solves basic 25 - 49% of the time - needs direction more than half the time to initiate, plan or complete simple activities  Function - Memory Memory assist level: Recognizes or recalls 25 - 49% of the time/requires cueing 50 - 75% of the time Patient normally able to recall (first 3 days only): That he or she is in a hospital  Medical Problem List and Plan: 1.Decreased functional mobilitysecondary to left frontal intraparenchymal  hemorrhage with recent deep brain stimulator for tremor. l -CIR PT, OT, SLP evals today 2. DVT Prophylaxis/Anticoagulation: SCDs. Monitor for any signs of DVT 3. Pain Management:Neurontin 300 mg 3 times a day, Tylenol as needed 4. Mood:Provide emotional support 5. Neuropsych: This patientisnot consistentlycapable of making decisions onhisown behalf. -monitor for ongoing lethargy -follow sleep chart -limit neuro-sedating meds when possible, further work up if necessary 6. Skin/Wound Care:Routine skin checks 7. Fluids/Electrolytes/Nutrition:Routine I&O's with follow-up chemistries 8.Hypertension. Cozaar 100 mg daily, Lasix 20 mg daily. Vitals:   05/19/17 1951 05/20/17 0115  BP: (!) 120/43 (!) 114/44  Pulse: 60 (!) 58  Resp:  20  Temp:  98.2 F (36.8 C)  SpO2:  100%  controlled 2/3 9.CAD with stenting/CABG. No chest pain or shortness of breath 10.Hyperlipidemia. Lipitor,Lovaza' 11. Tremors: -mysoline and inderal ongoing -simulator not operational yet 12.  Dysphagia with cough on thin liq but will have SLP re eval today, check CXR 13.  Hypoalb- Prostat  LOS (Days) 2 A FACE TO FACE EVALUATION WAS PERFORMED  Charlett Blake 05/20/2017, 8:03 AM

## 2017-05-20 NOTE — Progress Notes (Signed)
Physical Therapy Session Note  Patient Details  Name: Brendan [name] MRN: [id number] Date of Birth: 04/15/1941  Today's Date: 05/20/2017 PT Individual Time: 1000-1100 PT Individual Time Calculation (min): 60 min   Short Term Goals: Week 1:  PT Short Term Goal 1 (Week 1): Pt will increase bed mobility to mod A. PT Short Term Goal 2 (Week 1): Pt will increase transfers to mod A.  PT Short Term Goal 3 (Week 1): Pt will ambulate with LRAD about 25 feet with mod A.  PT Short Term Goal 4 (Week 1): Pt will ascend/descend 4 stairs with B rails and max A.  PT Short Term Goal 5 (Week 1): Pt will propel w/c about 25 feet with min A.   Skilled Therapeutic Interventions/Progress Updates:  Pt was seen bedside in the am. Pt transferred supine to edge of bed, edge of bed to supine with side rail and mod to max A with verbal cues. Pt performed multiple sit to stand transfers with mod A and verbal cues. Pt performed multiple stand pivot transfers with mod to max A and verbal cues. Attempted rolling walker for ambulation however increased difficulty secondary to L UE tremors. Pt ambulated 25 feet x 2 with L hand rail and mod to max A with verbal cues. Pt returned to room following treatment. Pt left sitting up in bed with nurse tech at bedside.   Therapy Documentation Precautions:  Precautions Precautions: Fall Precaution Comments: Lt gaze preference, Rt hemi, aphasic, R inattention Restrictions Weight Bearing Restrictions: No General:   Pain: No c/o pain.   See Function Navigator for Current Functional Status.   Therapy/Group: Individual Therapy  Rayford HalstedMitchell, Kamden Reber G 05/20/2017, 12:22 PM

## 2017-05-21 ENCOUNTER — Inpatient Hospital Stay (HOSPITAL_COMMUNITY): Payer: PPO | Admitting: Speech Pathology

## 2017-05-21 ENCOUNTER — Inpatient Hospital Stay (HOSPITAL_COMMUNITY): Payer: PPO

## 2017-05-21 ENCOUNTER — Inpatient Hospital Stay (HOSPITAL_COMMUNITY): Payer: PPO | Admitting: Occupational Therapy

## 2017-05-21 ENCOUNTER — Inpatient Hospital Stay (HOSPITAL_COMMUNITY): Payer: PPO | Admitting: Physical Therapy

## 2017-05-21 DIAGNOSIS — I619 Nontraumatic intracerebral hemorrhage, unspecified: Secondary | ICD-10-CM

## 2017-05-21 DIAGNOSIS — R251 Tremor, unspecified: Secondary | ICD-10-CM

## 2017-05-21 DIAGNOSIS — R1310 Dysphagia, unspecified: Secondary | ICD-10-CM

## 2017-05-21 DIAGNOSIS — E119 Type 2 diabetes mellitus without complications: Secondary | ICD-10-CM

## 2017-05-21 DIAGNOSIS — D62 Acute posthemorrhagic anemia: Secondary | ICD-10-CM

## 2017-05-21 DIAGNOSIS — R7303 Prediabetes: Secondary | ICD-10-CM

## 2017-05-21 DIAGNOSIS — I10 Essential (primary) hypertension: Secondary | ICD-10-CM

## 2017-05-21 LAB — COMPREHENSIVE METABOLIC PANEL
ALT: 33 U/L (ref 17–63)
AST: 21 U/L (ref 15–41)
Albumin: 3.1 g/dL — ABNORMAL LOW (ref 3.5–5.0)
Alkaline Phosphatase: 88 U/L (ref 38–126)
Anion gap: 12 (ref 5–15)
BUN: 15 mg/dL (ref 6–20)
CO2: 23 mmol/L (ref 22–32)
Calcium: 9.1 mg/dL (ref 8.9–10.3)
Chloride: 101 mmol/L (ref 101–111)
Creatinine, Ser: 1.01 mg/dL (ref 0.61–1.24)
GFR calc Af Amer: >60 mL/min (ref >60)
GFR calc non Af Amer: >60 mL/min (ref >60)
Glucose, Bld: 172 mg/dL — ABNORMAL HIGH (ref 65–99)
Potassium: 4.4 mmol/L (ref 3.5–5.1)
Sodium: 136 mmol/L (ref 135–145)
Total Bilirubin: 0.8 mg/dL (ref 0.3–1.2)
Total Protein: 6.1 g/dL — ABNORMAL LOW (ref 6.5–8.1)

## 2017-05-21 LAB — CBC WITH DIFFERENTIAL/PLATELET
Basophils Absolute: 0 10*3/uL (ref 0.0–0.1)
Basophils Relative: 0 %
Eosinophils Absolute: 0.1 10*3/uL (ref 0.0–0.7)
Eosinophils Relative: 2 %
HCT: 32.7 % — ABNORMAL LOW (ref 39.0–52.0)
Hemoglobin: 11.3 g/dL — ABNORMAL LOW (ref 13.0–17.0)
Lymphocytes Relative: 28 %
Lymphs Abs: 1.6 10*3/uL (ref 0.7–4.0)
MCH: 33.8 pg (ref 26.0–34.0)
MCHC: 34.6 g/dL (ref 30.0–36.0)
MCV: 97.9 fL (ref 78.0–100.0)
Monocytes Absolute: 0.3 10*3/uL (ref 0.1–1.0)
Monocytes Relative: 6 %
Neutro Abs: 3.7 10*3/uL (ref 1.7–7.7)
Neutrophils Relative %: 64 %
Platelets: 199 10*3/uL (ref 150–400)
RBC: 3.34 MIL/uL — ABNORMAL LOW (ref 4.22–5.81)
RDW: 13.8 % (ref 11.5–15.5)
WBC: 5.7 10*3/uL (ref 4.0–10.5)

## 2017-05-21 LAB — GLUCOSE, CAPILLARY: Glucose-Capillary: 186 mg/dL — ABNORMAL HIGH (ref 65–99)

## 2017-05-21 NOTE — Progress Notes (Signed)
Occupational Therapy Session Note  Patient Details  Name: Brendan [name] MRN: [id number] Date of Birth: 09/12/1940  Today's Date: 05/21/2017 OT Individual Time: [id number] OT Individual Time Calculation (min): 60 min    Short Term Goals: Week 1:  OT Short Term Goal 1 (Week 1): Pt will complete 1/4 components of donning shirt OT Short Term Goal 2 (Week 1): Pt will complete toilet transfer with Mod A 1 helper OT Short Term Goal 3 (Week 1): Pt will scan to the Rt for 1 ADL item with mod vcs OT Short Term Goal 4 (Week 1): Pt will initiate washing 3/11 body areas during bathing   Skilled Therapeutic Interventions/Progress Updates:    Pt received in w/c receiving meds from nursing.  Worked on PROM to RUE until RN completed care with pt. Pt taken to gym and his dtr attended therapy.  Stood to [employer] with R splint with min A.  Worked on stand pivot to his left with mod A for wt shifting to advance feet. Pt sat on mat and worked on trunk extension with mod A as he has kyphotic posture, attending to his R side, NMR to RUE with guided towel slides, wt bearing, and hand over hand guiding of picking up and stacking cups. Pt does have some increase in tone but no volitional movement notes.  He completed squat pivot transfer to his R back to w/c with mod A and his daughter providing 2nd person A to guide his hips.    Pt taken back to room and set up for transfer. As therapist was removing his leg rests his daughter noted that he had become clammy. Pt needed loud voice to open eyes as he was not verbalizing.  Immediately checked his blood pressure which was 76/40.  Called for RN assist immediately.  Transferred pt into bed into supine. RNs took over his care.  Pt had become alert and talking but still with low blood pressure. RNs with patient.    Therapy Documentation Precautions:  Precautions Precautions: Fall Precaution Comments: Lt gaze preference, Rt hemi, aphasic, R inattention Restrictions Weight Bearing  Restrictions: No    Vital Signs: Therapy Vitals Pulse Rate: 61 Resp: (!) 22 BP: 90/60 Oxygen Therapy SpO2: 98 % O2 Device: Not Delivered Pain: Pain Assessment Pain Assessment: No/denies pain Faces Pain Scale: Hurts even more Pain Type: Acute pain Pain Location: Head Pain Frequency: Intermittent Pain Onset: Gradual Pain Intervention(s): Medication (See eMAR) ADL: ADL ADL Comments: Please see functional navigator for ADL status  See Function Navigator for Current Functional Status.   Therapy/Group: Individual Therapy  Brendan [name] 05/21/2017, 12:26 PM

## 2017-05-21 NOTE — Progress Notes (Signed)
Social Work  Social Work Assessment and Plan  Patient Details  Name: Brendan [name] MRN: [id number] Date of Birth: 02/04/1941  Today's Date: 05/21/2017  Problem List:  Patient Active Problem List   Diagnosis Date Noted  . Sleep disturbance   . Confusion, postoperative   . Hypotension due to drugs   . Acute blood loss anemia   . Prediabetes   . Dysphagia   . Benign essential HTN   . Intraparenchymal hemorrhage of brain (HCC) 05/18/2017  . Tremors of nervous system 05/18/2017  . Right sided weakness   . Aphasia   . Intracerebral hemorrhage 05/16/2017  . Cerebral edema (HCC)   . Weakness 05/15/2017  . Coronary artery disease involving native coronary artery of native heart without angina pectoris   . Glaucoma   . Anxiety state   . Cognitive disorder   . Urinary retention   . Traumatic hemorrhage of left cerebrum without loss of consciousness (HCC)   . Pressure injury of skin 05/05/2017  . Seborrheic dermatitis of scalp 10/05/2016  . Tremor, essential 10/03/2016  . Cough 06/27/2016  . Rhinitis, allergic 01/12/2014  . CHRONIC STABLE ANGINA 08/06/2013  . Obesity (BMI 30-39.9) 01/30/2013  . Lower extremity edema 01/30/2013  . S/P CABG x 3   . Atherosclerotic heart disease of native coronary artery with angina pectoris (HCC)   . Hyperlipidemia   . Memory problem 07/01/2012  . RSD (reflex sympathetic dystrophy) 05/24/2012  . Pleural plaque with presence of asbestos 05/15/2012  . Anemia, B12 deficiency 03/19/2012  . Atherosclerotic heart disease of artery bypass graft 09/16/2011  . BPH (benign prostatic hyperplasia) 08/08/2011  . Microscopic hematuria 02/27/2011  . Anxiety 10/03/2010  . Incontinence 07/21/2010  . ANGINA, STABLE 04/29/2008  . GERD 10/28/2007  . Essential tremor 08/30/2006  . Essential hypertension 07/06/2006   Past Medical History:  Past Medical History:  Diagnosis Date  . Anxiety disorder   . Benign enlargement of prostate   . CAD in native artery  1997   Referred for CABG x3 in 2004 for LAD diagonal bifurcation lesion  . Cervical spinal stenosis   . Complex regional pain syndrome of right upper extremity    Right wrist; L arm  . Coronary atherosclerosis of artery bypass graft June 2013   Occluded SVG-D1; Cardiologist Dr. Herbie Baltimore  . Gait disorder   . Gastroesophageal reflux disease   . Glaucoma   . Hiatal hernia    GI: Dr Evette Cristal  . Hyperlipidemia LDL goal <70   . Hypertension   . Memory loss    Mild  . Obesity   . Peripheral neuropathy    possible peripheral neuropathy  . S/P CABG x 3 2004    LIMA-LAD, SVG to diagonal, SVG to OM  . Tremor, essential    On Primidone  . Unstable angina pectoris [facility])  June 2013   Cardiac cath: occluded SVG-DI. Patent LIMA-LAD and SVG-OM. EF 45% with apical inferior HK.   Past Surgical History:  Past Surgical History:  Procedure Laterality Date  . CARDIAC CATHETERIZATION    . CATARACT EXTRACTION, BILATERAL    . CORONARY ARTERY BYPASS GRAFT  06/2002   LIMA-LAD, SVG-D1, SVG-OM  . EYE SURGERY    . KNEE SURGERY Left   . LEFT HEART CATHETERIZATION WITH CORONARY/GRAFT ANGIOGRAM N/A 10/13/2011   Procedure: LEFT HEART CATHETERIZATION WITH Isabel Caprice;  Surgeon: Marykay Lex, MD;  Location: [facility] CATH LAB;  Service: Cardiovascular;  Laterality: N/A;  . MINOR PLACEMENT OF FIDUCIAL N/A  04/26/2017   Procedure: Fiducial placement;  Surgeon: Maeola HarmanStern, Joseph, MD;  [facility];  Service: Neurosurgery;  Laterality: N/A;  Fiducial placement  . NM MYOCAR PERF WALL MOTION  03/23/2009   protocol:Bruce, normal perfusion in all regions, post-stress EF 72%, exercise capacity 7METS, EKG negative for ischemia.  . Shoulder Orthoscopic Surgery   06/2003  . SUBTHALAMIC STIMULATOR INSERTION Bilateral 05/04/2017   Procedure: Bilateral Deep brain stimulator placement;  Surgeon: Maeola HarmanStern, Joseph, MD;  [facility];  Service: Neurosurgery;  Laterality: Bilateral;  Bilateral deep brain stimulator placement  .  TRANSTHORACIC ECHOCARDIOGRAM  April 2014   EF 55-60%; no regional wall motion abnormalities. Relatively normal study normal diastolic parameters/abnormal relaxation  . wrist ganglion cyst Left    Social History:  reports that he quit smoking about 49 years ago. His smoking use included cigarettes. He has a 60.00 pack-year smoking history. he has never used smokeless tobacco. He reports that he does not drink alcohol or use drugs.  Family / Support Systems Marital Status: Married Patient Roles: Spouse, Parent Spouse/Significant Other: wife, Royal PiedraMary Arviso @ [phone](C) [phone] Children: daughter, Markus DaftSandy Moon [facility](Neibert) @ (C) [phone];  wife notes they have another daughter, Angelique BlonderDenise, however, states that she does not expect any assistance from her. Anticipated Caregiver: wife Ability/Limitations of Caregiver: Wife is retired, but, can only provide supervision as she has back issues and cannot lift. Caregiver Availability: 24/7 Family Dynamics: Wife describes daughter, [name], as supportive but notes she is limited by her work in terms of availability.  Notes ongoing stressors with other daughter.  Social History Preferred language: English Religion: Baptist Cultural Background: None Read: Yes Write: Yes Employment Status: Retired Date Retired/Disabled/Unemployed: ~12 yrs from [employer] Legal Hisotry/Current Legal Issues: None Guardian/Conservator: None-Per MD, patient is not capable of making decisions on his own behalf-defer to spouse.   Abuse/Neglect Abuse/Neglect Assessment Can Be Completed: Yes Physical Abuse: Denies Verbal Abuse: Denies Sexual Abuse: Denies Exploitation of patient/patient's resources: Denies Self-Neglect: Denies  Emotional Status Pt's affect, behavior adn adjustment status: Patient sitting up in wheelchair but allows wife to provide assessment information.  Flat affect likely due to neurological deficits.  Patient does attempt to answer some questions directed  to him but responses are slow.  We did talk about his time working here and his coworkers who he has seen.  Patient admits much frustration with his deficits and new medical issues that have occurred since placement of the stimulator.  May benefit from neuropsychology consult for additional emotional support. Recent Psychosocial Issues: Recent placement of brain stimulator, however, wife notes it is not connected yet due to recent hemorrhages. Pyschiatric History: None Substance Abuse History: None  Patient / Family Perceptions, Expectations & Goals Pt/Family understanding of illness & functional limitations: Patient and wife with basic understanding of his brain hemorrhages, current functional deficits and need for CIR. 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 Anticipated changes in roles/activities/participation: Per therapy evaluations and goals, it is anticipated the patient will need an increase in physical assistance.  Wife and daughter will need to increase their physical support. Pt/family expectations/goals: Wife hopeful that the amount of physical assistance will be minimal as she has "back issues".  Community Resources [employer] Agencies: None Premorbid Home Care/DME Agencies: None Transportation available at discharge: Yes Resource referrals recommended: Neuropsychology, Support group (specify)  Discharge Planning Living Arrangements: Spouse/significant other Support Systems: Spouse/significant other, Children Type of Residence: Private  residence Civil engineer, contracting: Media planner (specify)([employer]) Financial Resources: Restaurant manager, fast food Screen Referred: No Living Expenses: Own Money Management: Spouse Does the patient have any problems obtaining your medications?: No Home Management: Wife Patient/Family Preliminary Plans:  Anticipate patient to DC home with wife as primary support.  Daughter, [name], is hopeful to get FMLA from her job to assist in care as well. Social Work Anticipated Follow Up Needs: HH/OP Expected length of stay: 3-4 weeks  Clinical Impression Very pleasant gentleman here following a hemorrhage post placement of brain stimulator.  Wife very supportive and at bedside and assist with assessing interview as patient's speech is slowed and very soft due to neurological deficits.  Patient admits much frustration with current functional deficits and delay in starting the use of the stimulator.  Wife concerned about level of physical assistance patient will likely need at discharge noting that she has "back issues".  Daughter is working to get FMLA in order to help him at home.  Will refer for neuropsychology for additional support while here and will follow for discharge needs.  Donovan Persley 05/21/2017, 3:01PM

## 2017-05-21 NOTE — Progress Notes (Signed)
Patient information reviewed and entered into eRehab system by Holleigh Crihfield, RN, CRRN, PPS Coordinator.  Information including medical coding and functional independence measure will be reviewed and updated through discharge.     Per nursing patient was given "Data Collection Information Summary for Patients in Inpatient Rehabilitation Facilities with attached "Privacy Act Statement-Health Care Records" upon admission.  

## 2017-05-21 NOTE — Progress Notes (Signed)
Trish MageLogue, Bryceson Grape M, RN  Rehab Admission Coordinator  Physical Medicine and Rehabilitation  PMR Pre-admission  Signed  Date of Service:  05/18/2017 1:06 PM       Related encounter: ED to Hosp-Admission (Discharged) [facility]      Signed            [] Hide copied text  [] Hover for details   PMR Admission Coordinator Pre-Admission Assessment  Patient: Brendan [name] is an 77 y.o., male MRN: [id number] DOB: 10/29/1940 Height: 5\' 9"  (175.3 cm) Weight: 88.9 kg (195 lb 15.8 oz)                                                                                                                          Insurance Information HMO:      PPO:  Yes     PCP:       IPA:       80/20:       OTHER:  Group Htam[employer] PRIMARY: [employer]      Policy#: $[id number]      Subscriber: $3400 CM Name: Darl PikesSusan      Phone#:     Fax#: [phone] Pre-Cert#: [id number] with approval X 10 days      Employer:  Retired Benefits:  Phone #: [phone]     Name:  On line Eff. Date: 04/17/17     Deduct:  $0      Out of Pocket Max: $3400      Life Max: N/A CIR: $295 days 1-6      SNF: $20 days 1-20; $160 days 21-100 Outpatient:  Medical necessity     Co-Pay: $15/visit Home Health: 100%      Co-Pay: none DME: 80%     Co-Pay: 20% Providers: in network  Emergency Contact Information        Contact Information    Name Relation Home Work [place]   Pandit,[name] Spouse [phone]  [phone]   Markus DaftMoon, [name] Daughter   [phone]     Current Medical History  Patient Admitting Diagnosis: Left frontal intraparenchymal hemorrhage status post recent deep brain stimulator for tremor  History of Present Illness: A 77 y.o.right handedmalewith history of CAD with stenting as well as CABG, hypertension, memory loss and bilateral deep brain stimulator electrode placement per Dr. Venetia MaxonStern 05/04/2017 for essential tremors. Prior to need for skilled nursing facility placement patient  lived with spouse. Needed assistance with self feeding and ADLs due to tremors. Family did encourage the use of a rolling walker but patient refused.Patient was discharged to [facility] skilled nursing facility 05/11/2017 ambulating minimal assist level. Presented 05/15/2017 with increasing right sided weakness. CT of the head showed interval evolution of recently seen intraparenchymal hematoma positioned within the left frontal lobe along the left deep brain stimulator with increased regional edema Negative CTA.EEG moderate diffuse slowing no seizure activity. Echocardiogramwith ejection fraction of 60% no wall motion abnormalities.Follow-up MRI 05/17/2017 with small postoperative hematoma after deep  brain stimulator implant and continue to monitor.Maintained on a regular diet. Physical and occupational therapy evaluations completed with recommendations for inpatient rehab admission. M.D.  requested physical medicine rehabilitation consult.Patient to be admitted for a comprehensive inpatient rehabilitation program.  Total: 15=NIH  Past Medical History      Past Medical History:  Diagnosis Date  . Anxiety disorder   . Benign enlargement of prostate   . CAD in native artery 1997   Referred for CABG x3 in 2004 for LAD diagonal bifurcation lesion  . Cervical spinal stenosis   . Complex regional pain syndrome of right upper extremity    Right wrist; L arm  . Coronary atherosclerosis of artery bypass graft June 2013   Occluded SVG-D1; Cardiologist Dr. Herbie Baltimore  . Gait disorder   . Gastroesophageal reflux disease   . Glaucoma   . Hiatal hernia    GI: Dr Evette Cristal  . Hyperlipidemia LDL goal <70   . Hypertension   . Memory loss    Mild  . Obesity   . Peripheral neuropathy    possible peripheral neuropathy  . S/P CABG x 3 2004    LIMA-LAD, SVG to diagonal, SVG to OM  . Tremor, essential    On Primidone  . Unstable angina pectoris [facility])  June 2013   Cardiac cath: occluded  SVG-DI. Patent LIMA-LAD and SVG-OM. EF 45% with apical inferior HK.    Family History  family history includes Drug abuse in his daughter; Heart disease in his brother, brother, and mother; Hepatitis C in his daughter; Tremor in his paternal uncle.  Prior Rehab/Hospitalizations: Patient was at [facility] SN[facility] for 4-5 days prior to readmission to [facility].  Has the patient had major surgery during 100 days prior to admission? Yes.  Patient had a deep brain stimulator placed on 05/04/17.    Current Medications   Current Facility-Administered Medications:  .  acetaminophen (TYLENOL) tablet 650 mg, 650 mg, Oral, Q4H PRN, 650 mg at 05/17/17 1524 **OR** acetaminophen (TYLENOL) solution 650 mg, 650 mg, Per Tube, Q4H PRN **OR** acetaminophen (TYLENOL) suppository 650 mg, 650 mg, Rectal, Q4H PRN, Myrene Buddy, MD .  aspirin chewable tablet 81 mg, 81 mg, Oral, Daily, Beaulah Dinning, MD, 81 mg at 05/18/17 0943 .  atorvastatin (LIPITOR) tablet 40 mg, 40 mg, Oral, Daily, Myrene Buddy, MD, 40 mg at 05/18/17 0942 .  feeding supplement (ENSURE ENLIVE) (ENSURE ENLIVE) liquid 237 mL, 237 mL, Oral, TID BM, Chambliss, Marshall L, MD, 237 mL at 05/18/17 0943 .  furosemide (LASIX) tablet 20 mg, 20 mg, Oral, Daily, Myrene Buddy, MD, 20 mg at 05/18/17 0942 .  gabapentin (NEURONTIN) tablet 300 mg, 300 mg, Oral, TID, Myrene Buddy, MD, 300 mg at 05/18/17 0942 .  loratadine (CLARITIN) tablet 10 mg, 10 mg, Oral, Daily, Myrene Buddy, MD, 10 mg at 05/18/17 0942 .  losartan (COZAAR) tablet 100 mg, 100 mg, Oral, Daily, Myrene Buddy, MD, 100 mg at 05/18/17 0943 .  nitroGLYCERIN (NITROSTAT) SL tablet 0.4 mg, 0.4 mg, Sublingual, Q5 min PRN, Myrene Buddy, MD .  omega-3 acid ethyl esters (LOVAZA) capsule 1 g, 1 g, Oral, BID, Myrene Buddy, MD .  pantoprazole (PROTONIX) EC tablet 40 mg, 40 mg, Oral, Daily, Myrene Buddy, MD, 40 mg at 05/18/17 0943 .  polyvinyl alcohol (LIQUIFILM TEARS) 1.4 %  ophthalmic solution 1 drop, 1 drop, Both Eyes, Daily PRN, Myrene Buddy, MD .  primidone (MYSOLINE) tablet 50 mg, 50 mg, Oral, BID, Myrene Buddy, MD, 50 mg at 05/18/17  1610 .  propranolol (INDERAL) tablet 80 mg, 80 mg, Oral, BID, Myrene Buddy, MD, 80 mg at 05/18/17 0941 .  senna-docusate (Senokot-S) tablet 1 tablet, 1 tablet, Oral, QHS PRN, Myrene Buddy, MD  Facility-Administered Medications Ordered in Other Encounters:  .  0.9 %  sodium chloride infusion, , Intravenous, Continuous, Marykay Lex, MD .  sodium chloride 0.9 % injection 3 mL, 3 mL, Intravenous, PRN, Marykay Lex, MD  Patients Current Diet: Fall precautions Diet Heart Room service appropriate? Yes; Fluid consistency: Thin  Precautions / Restrictions Precautions Precautions: Fall Restrictions Weight Bearing Restrictions: No   Has the patient had 2 or more falls or a fall with injury in the past year?No.  Reports that he fell one time 04/03/17 due to the snow, no injury.  Prior Activity Level Household: Staying at home most of the time.  Goes out for MD appointments only.  Home Assistive Devices / Equipment Home Assistive Devices/Equipment: Environmental consultant (specify type)(four wheel) Home Equipment: Walker - 2 wheels, Cane - single point  Prior Device Use: Indicate devices/aids used by the patient prior to current illness, exacerbation or injury? None per daughter.  Prior Functional Level Prior Function Level of Independence: Needs assistance Gait / Transfers Assistance Needed: daughter reports he was very independent PTA  ADL's / Homemaking Assistance Needed: Assist with self-feeding and ADL due to tremors, showered and dressed himself  Self Care: Did the patient need help bathing, dressing, using the toilet or eating?  Independent  Indoor Mobility: Did the patient need assistance with walking from room to room (with or without device)? Independent  Stairs: Did the patient need assistance with  internal or external stairs (with or without device)? Independent  Functional Cognition: Did the patient need help planning regular tasks such as shopping or remembering to take medications? Independent  Current Functional Level Cognition  Arousal/Alertness: Awake/alert Overall Cognitive Status: Impaired/Different from baseline Difficult to assess due to: Impaired communication Current Attention Level: Sustained Orientation Level: Oriented to person, Oriented to place, Disoriented to time, Disoriented to situation Following Commands: Follows one step commands with increased time General Comments: pt frequently stating, "I don't know" reports memory deficits Attention: Sustained Sustained Attention: Impaired Sustained Attention Impairment: Verbal basic, Functional basic Memory: Impaired Memory Impairment: Decreased recall of new information Awareness: Impaired Awareness Impairment: Intellectual impairment Problem Solving: Impaired Problem Solving Impairment: Verbal basic    Extremity Assessment (includes Sensation/Coordination)  Upper Extremity Assessment: RUE deficits/detail RUE Deficits / Details: tone WFL, no active movement noted RUE Sensation: decreased proprioception RUE Coordination: decreased fine motor, decreased gross motor LUE Deficits / Details: tremulous, very strong  Lower Extremity Assessment: Defer to PT evaluation    ADLs  General ADL Comments: Total assistance for all ADL at this time.     Mobility  Overal bed mobility: Needs Assistance Bed Mobility: Supine to Sit Supine to sit: Min assist Sit to supine: Min guard General bed mobility comments: bed mobility improving today, able to come to EOB with general MinA for LEs and trunk     Transfers  Overall transfer level: Needs assistance Equipment used: Rolling walker (2 wheeled) Transfers: Sit to/from Stand Sit to Stand: Mod assist General transfer comment: heavy ModA today, continues to require  assistance in placing R UE on walker     Ambulation / Gait / Stairs / Wheelchair Mobility  Ambulation/Gait Ambulation/Gait assistance: Mod assist Ambulation Distance (Feet): 15 Feet Assistive device: Rolling walker (2 wheeled) Gait Pattern/deviations: Step-through pattern, Decreased step length - right, Decreased  step length - left, Decreased dorsiflexion - right, Decreased dorsiflexion - left, Shuffle, Trunk flexed, Drifts right/left General Gait Details: patient with increased LOB/unsteadiness today requiring ModA to maintain balance and safety; he reports he did not sleep well last night and is very fatigued     Posture / Balance Dynamic Sitting Balance Sitting balance - Comments: able to maintain midline sitting with S today at EOB  Balance Overall balance assessment: Needs assistance Sitting-balance support: Feet supported, Bilateral upper extremity supported Sitting balance-Leahy Scale: Fair Sitting balance - Comments: able to maintain midline sitting with S today at EOB  Standing balance support: Bilateral upper extremity supported, During functional activity Standing balance-Leahy Scale: Poor Standing balance comment: reliant on UE support on walker     Special needs/care consideration BiPAP/CPAP No CPM No Continuous Drip IV No  Dialysis No      Life Vest No Oxygen No Special Bed No Trach Size No Wound Vac (area) No  Skin Sacral redness, pressure injury                        Bowel mgmt: Last BM 05/18/17 Bladder mgmt: Condom catheter Diabetic mgmt No    Previous Home Environment Living Arrangements: Spouse/significant other  Lives With: Spouse Available Help at Discharge: Family, Available 24 hours/day Type of Home: House Care Facility Name: [facility] Home Layout: One level Home Access: Stairs to enter Entrance Stairs-Rails: Can reach both Entrance Stairs-Number of Steps: 4 Bathroom Shower/Tub: Hydrographic surveyor, Health visitor:  Standard Home Care Services: No  Discharge Living Setting Plans for Discharge Living Setting: Patient's home, House, Lives with (comment)(Lives with wife.) Type of Home at Discharge: House Discharge Home Layout: One level Discharge Home Access: Stairs to enter Entrance Stairs-Number of Steps: 4-5 steps to deck entry. Does the patient have any problems obtaining your medications?: No  Social/Family/Support Systems Patient Roles: Spouse, Parent, Other (Comment)(Has a wife, 2 daughters, a sister.) Contact Information: Colton Tassin - wife Anticipated Caregiver: wife Anticipated Caregiver's Contact Information: Corrie Dandy - wife - (c) [phone] Ability/Limitations of Caregiver: Wife is retired and can assist. Medical laboratory scientific officer: 24/7 Discharge Plan Discussed with Primary Caregiver: Yes Is Caregiver In Agreement with Plan?: Yes Does Caregiver/Family have Issues with Lodging/Transportation while Pt is in Rehab?: No  Goals/Additional Needs Patient/Family Goal for Rehab: PT supervision, OT supervision to min assit, SLP supervision goals Expected length of stay: 8-11 days Cultural Considerations: None Dietary Needs: Heart diet, thin liquids Equipment Needs: TBD Pt/Family Agrees to Admission and willing to participate: Yes Program Orientation Provided & Reviewed with Pt/Caregiver Including Roles  & Responsibilities: Yes  Decrease burden of Care through IP rehab admission: N/A  Possible need for SNF placement upon discharge: Not planned  Patient Condition: This patient's condition remains as documented in the consult dated 05/17/17, in which the Rehabilitation Physician determined and documented that the patient's condition is appropriate for intensive rehabilitative care in an inpatient rehabilitation facility. Will admit to inpatient rehab today.  Preadmission Screen Completed By:  Trish Mage, 05/18/2017 2:20  PM ______________________________________________________________________   Discussed status with Dr. Riley Kill on 05/18/17 at 1420 and received telephone approval for admission today.  Admission Coordinator:  Trish Mage, time 0981 Dorna Bloom 05/18/17             Cosigned by: Ranelle Oyster, MD at 05/18/2017 2:29 PM  Revision History

## 2017-05-21 NOTE — Progress Notes (Signed)
Speech Language Pathology Daily Session Note  Patient Details  Name: Brendan [name] MRN: [id number] Date of Birth: 07/13/1940  Today's Date: 05/21/2017 SLP Individual Time: 0730-0830 SLP Individual Time Calculation (min): 60 min  Short Term Goals: Week 1: SLP Short Term Goal 1 (Week 1): Pt will sustain attention to basic familiar task  for ~ 15 minutes with Mod A cues.  SLP Short Term Goal 2 (Week 1): Pt will name common objects with ~ 90% accuracy and Min A cues.  SLP Short Term Goal 3 (Week 1): Pt will increase speech intelligibility at the simple phrase level to achieve ~ 75% intelligibility given Mod A cues for use of speech intelligibility strategies.  SLP Short Term Goal 4 (Week 1): Pt will complete basic familiar problem solving tasks related to ADLs with Mod A cues.  SLP Short Term Goal 5 (Week 1): Pt will consume current diet with minimal overt s/s of aspiration and Min A cues for use of compensatory swallow strategies.   Skilled Therapeutic Interventions: Skilled treatment session focused on dysphagia and communication goals as well as education on aphasia, cognition and swallow deficits. SLP received pt in bed and pt required Min A verbal and visual cues for bed mobility targeting pericare and transfer to wheelchair as well as dressing d/t receptive language deficits. SLP further facilitated session by providing skilled observation of pt consuming dysphagia 2 breakfast with thin liquids via straw and no head turn. Pt free of overt s/s of aspiration without use of head. Would recommend using head turn if pt starts coughing with thin liquids. Pt able to select requested object in field of 2 independently and pt required sentence completion to name 7 out of 10 objects. Pt with 25% accuracy with simple yes/no questions. Simple yes/no questions written in large font but pt largely unable to read any words or numbers. Unsure of visual vs. Decoding. Extensive information provided to daughter on  areas related to aphasia etc. Pt was left upright in room, upright in wheelchair with daughter present. Continue per current plan of care.      Function:  Eating Eating   Modified Consistency Diet: Yes Eating Assist Level: Help with picking up utensils;Help managing cup/glass;Supervision or verbal cues;More than reasonable amount of time;Set up assist for   Eating Set Up Assist For: Opening containers       Cognition Comprehension Comprehension assist level: Understands basic 25 - 49% of the time/ requires cueing 50 - 75% of the time  Expression   Expression assist level: Expresses basic 25 - 49% of the time/requires cueing 50 - 75% of the time. Uses single words/gestures.  Social Interaction Social Interaction assist level: Interacts appropriately 50 - 74% of the time - May be physically or verbally inappropriate.  Problem Solving Problem solving assist level: Solves basic 25 - 49% of the time - needs direction more than half the time to initiate, plan or complete simple activities  Memory Memory assist level: Recognizes or recalls 25 - 49% of the time/requires cueing 50 - 75% of the time    Pain Pain Assessment Pain Assessment: Faces Faces Pain Scale: Hurts even more Pain Type: Acute pain Pain Location: Head Pain Frequency: Intermittent Pain Onset: Gradual Pain Intervention(s): Medication (See eMAR)  Therapy/Group: Individual Therapy  Brendan [name] 05/21/2017, 9:34 AM

## 2017-05-21 NOTE — Progress Notes (Signed)
Occupational Therapy Session Note  Patient Details  Name: Brendan [name] MRN: [id number] Date of Birth: 12/11/1940  Today's Date: 05/21/2017 OT Individual Time: 1354-1443 OT Individual Time Calculation (min): 49 min   Short Term Goals: Week 1:  OT Short Term Goal 1 (Week 1): Pt will complete 1/4 components of donning shirt OT Short Term Goal 2 (Week 1): Pt will complete toilet transfer with Mod A 1 helper OT Short Term Goal 3 (Week 1): Pt will scan to the Rt for 1 ADL item with mod vcs OT Short Term Goal 4 (Week 1): Pt will initiate washing 3/11 body areas during bathing   Skilled Therapeutic Interventions/Progress Updates:    Pt greeted in bed with HOB elevated, eating lunch with dtr providing supervision. Upon arrival, worked on [place] visual field scanning, Rt NMR, and cognitive remediation. Pt able to consistently bring food to mouth using utensils with min tremor activity. Max multimodal cues for scanning to Rt with HOH for using R UE as gross stabilizer while consuming pudding. Afterwards he transitioned to EOB, able to achieve midline orientation for a few minutes while sitting unsupported, then Rt lean persisted with pt requiring max cues and Mod A to correct at times. Pt threading L LE into pants with extra time to self organize and instructional cues for problem solving/orientation. Sit<stand with RW and Mod A for pulling pants over hips. Had pt identify colors on room wall to promote forward gaze. Manual cuing for achieving upright postural alignment due to kyphosis. Max A stand pivot<w/c with manual facilitation for weightshifting and increased time for step initiation. Educated family on seated activities they could participate in with him to promote Rt scanning, Rt NMR, and forward gaze. At end of tx pt was left in w/c with all needs and family.   Therapy Documentation Precautions:  Precautions Precautions: Fall Precaution Comments: Lt gaze preference, Rt hemi, aphasic, R  inattention Restrictions Weight Bearing Restrictions: No Vital Signs: Therapy Vitals Temp: 97.8 F (36.6 C) Temp Source: Oral Pulse Rate: 70 Resp: 18 BP: (!) 102/51(returned pt to bed) Patient Position (if appropriate): Sitting Oxygen Therapy SpO2: 96 % O2 Device: Not Delivered Pain: Pain Assessment Pain Assessment: No/denies pain ADL: ADL ADL Comments: Please see functional navigator for ADL status     See Function Navigator for Current Functional Status.  Therapy/Group: Individual Therapy  [name] A [name] 05/21/2017, 3:47 PM

## 2017-05-21 NOTE — Progress Notes (Signed)
Subjective/Complaints: Pt seen sitting up in his chair working with therapies this AM, reported 50% accuracy with yes/no.  Family present. No reported issues overnight.   ROS: Unable to assess due to aphasia.  Objective: Vital Signs: Blood pressure (!) 120/58, pulse 63, temperature 98.2 F (36.8 C), temperature source Oral, resp. rate 18, height '5\' 9"'  (1.753 m), weight 92 kg (202 lb 12.8 oz), SpO2 97 %. Dg Chest 2 View  Result Date: 05/19/2017 CLINICAL DATA:  Nonproductive cough. EXAM: CHEST  2 VIEW COMPARISON:  CT chest 06/17/2016 FINDINGS: There is mild bilateral interstitial prominence low lung volumes. There is right upper thoracic pleural based mass better visualize and characterize on CT chest dated 07/14/2016. There is focal pleural thickening along the left lateral chest unchanged from the prior CT chest dated 07/14/2016. There is no focal consolidation. There is no pleural effusion or pneumothorax. The heart mediastinum are stable. There is evidence of prior CABG. The osseous structures are unremarkable. IMPRESSION: No active cardiopulmonary disease. Bilateral focal pleural thickening similar in appearance to the prior CT chest dated 07/14/2016. Electronically Signed   By: Kathreen Devoid   On: 05/19/2017 11:05   Results for orders placed or performed during the hospital encounter of 05/18/17 (from the past 72 hour(s))  Basic metabolic panel     Status: Abnormal   Collection Time: 05/18/17  5:09 PM  Result Value Ref Range   Sodium 136 135 - 145 mmol/L   Potassium 4.1 3.5 - 5.1 mmol/L   Chloride 100 (L) 101 - 111 mmol/L   CO2 25 22 - 32 mmol/L   Glucose, Bld 162 (H) 65 - 99 mg/dL   BUN 9 6 - 20 mg/dL   Creatinine, Ser 0.81 0.61 - 1.24 mg/dL   Calcium 8.7 (L) 8.9 - 10.3 mg/dL   GFR calc non Af Amer >60 >60 mL/min   GFR calc Af Amer >60 >60 mL/min    Comment: (NOTE) The eGFR has been calculated using the CKD EPI equation. This calculation has not been validated in all clinical  situations. eGFR's persistently <60 mL/min signify possible Chronic Kidney Disease.    Anion gap 11 5 - 15    Comment: Performed at [facility] [address] [place], [place] [address]  CBC WITH DIFFERENTIAL     Status: Abnormal   Collection Time: 05/19/17  8:44 AM  Result Value Ref Range   WBC 6.6 4.0 - 10.5 K/uL   RBC 3.55 (L) 4.22 - 5.81 MIL/uL   Hemoglobin 11.5 (L) 13.0 - 17.0 g/dL   HCT 34.3 (L) 39.0 - 52.0 %   MCV 96.6 78.0 - 100.0 fL   MCH 32.4 26.0 - 34.0 pg   MCHC 33.5 30.0 - 36.0 g/dL   RDW 13.4 11.5 - 15.5 %   Platelets 224 150 - 400 K/uL   Neutrophils Relative % 73 %   Neutro Abs 4.8 1.7 - 7.7 K/uL   Lymphocytes Relative 22 %   Lymphs Abs 1.5 0.7 - 4.0 K/uL   Monocytes Relative 4 %   Monocytes Absolute 0.3 0.1 - 1.0 K/uL   Eosinophils Relative 1 %   Eosinophils Absolute 0.1 0.0 - 0.7 K/uL   Basophils Relative 0 %   Basophils Absolute 0.0 0.0 - 0.1 K/uL    Comment: Performed at [facility] [address], [place], [place] [address]  Comprehensive metabolic panel     Status: Abnormal   Collection Time: 05/19/17  8:44 AM  Result Value Ref Range   Sodium 135 135 - 145 mmol/L   Potassium 4.4 3.5 - 5.1 mmol/L   Chloride 101 101 - 111 mmol/L   CO2 25 22 - 32 mmol/L   Glucose, Bld 220 (H) 65 - 99 mg/dL   BUN 9 6 - 20 mg/dL   Creatinine, Ser 0.95 0.61 - 1.24 mg/dL   Calcium 9.0 8.9 - 10.3 mg/dL   Total Protein 6.3 (L) 6.5 - 8.1 g/dL   Albumin 3.1 (L) 3.5 - 5.0 g/dL   AST 25 15 - 41 U/L   ALT 34 17 - 63 U/L   Alkaline Phosphatase 88 38 - 126 U/L   Total Bilirubin 0.7 0.3 - 1.2 mg/dL   GFR calc non Af Amer >60 >60 mL/min   GFR calc Af Amer >60 >60 mL/min    Comment: (NOTE) The eGFR has been calculated using the CKD EPI equation. This calculation has not been validated in all clinical situations. eGFR's persistently <60 mL/min signify possible Chronic Kidney Disease.    Anion gap 9 5 - 15    Comment: Performed at [facility] [address], [place], [place] [address]  Glucose, capillary     Status: Abnormal   Collection Time: 05/19/17  4:57 PM  Result Value Ref Range   Glucose-Capillary 198 (H) 65 - 99 mg/dL  CBC WITH DIFFERENTIAL     Status: Abnormal   Collection Time: 05/21/17  4:31 AM  Result Value Ref Range   WBC 5.7 4.0 - 10.5 K/uL   RBC 3.34 (L) 4.22 - 5.81 MIL/uL   Hemoglobin 11.3 (L) 13.0 - 17.0 g/dL   HCT 32.7 (L) 39.0 - 52.0 %   MCV 97.9 78.0 - 100.0 fL   MCH 33.8 26.0 - 34.0 pg   MCHC 34.6 30.0 - 36.0 g/dL   RDW 13.8 11.5 - 15.5 %   Platelets 199 150 - 400 K/uL   Neutrophils Relative % 64 %   Neutro Abs 3.7 1.7 - 7.7 K/uL   Lymphocytes Relative 28 %   Lymphs Abs 1.6 0.7 - 4.0 K/uL   Monocytes Relative 6 %   Monocytes Absolute 0.3 0.1 - 1.0 K/uL   Eosinophils Relative 2 %   Eosinophils Absolute 0.1 0.0 - 0.7 K/uL   Basophils Relative 0 %   Basophils Absolute 0.0 0.0 - 0.1 K/uL    Comment: Performed at [facility], [address], [place], [place] [address]  Comprehensive metabolic panel     Status: Abnormal   Collection Time: 05/21/17  4:31 AM  Result Value Ref Range   Sodium 136 135 - 145 mmol/L   Potassium 4.4 3.5 - 5.1 mmol/L   Chloride 101 101 - 111 mmol/L   CO2 23 22 - 32 mmol/L   Glucose, Bld 172 (H) 65 - 99 mg/dL   BUN 15 6 - 20 mg/dL   Creatinine, Ser 1.01 0.61 - 1.24 mg/dL   Calcium 9.1 8.9 - 10.3 mg/dL   Total Protein 6.1 (L) 6.5 - 8.1 g/dL   Albumin 3.1 (L) 3.5 - 5.0 g/dL   AST 21 15 - 41 U/L   ALT 33 17 - 63 U/L   Alkaline Phosphatase 88 38 - 126 U/L   Total Bilirubin 0.8 0.3 - 1.2 mg/dL   GFR calc non Af Amer >60 >60 mL/min   GFR calc Af Amer >60 >60 mL/min    Comment: (NOTE) The eGFR has been calculated using the  CKD EPI equation. This calculation has not been validated in all clinical situations. eGFR's persistently <60 mL/min signify possible Chronic Kidney Disease.    Anion gap 12 5 - 15    Comment: Performed at [facility] [address],  [place], [place] [address]    General no acute distress. Well-developed.  HEENT: Normocephalic, atraumatic. Cardio: RRR and no JVD. Resp: CTA B/L and Unlabored GI: BS positive and ND Musc/Skel:  No edema, no tenderness. Neuro: Alert and Oriented x0 Expressive >> Receptive aphasia Motor: LUE/LLE: Grossly 5/5 proximal to distal RUE/RLE: 0/5 proximal to distal  LUE tremor Dysarthria Skin:   Intact. Warm and dry   Assessment/Plan: 1. Functional deficits secondary to left frontal parietal intraparenchymal hemorrhage with right hemiparesis, aphasia and cognitive deficits which require 3+ hours per day of interdisciplinary therapy in a comprehensive inpatient rehab setting. Physiatrist is providing close team supervision and 24 hour management of active medical problems listed below. Physiatrist and rehab team continue to assess barriers to discharge/monitor patient progress toward functional and medical goals. FIM: Function - Bathing Position: Sitting EOB Body parts bathed by patient: Chest, Abdomen, Front perineal area, Left upper leg Body parts bathed by helper: Right arm, Left arm, Buttocks, Right upper leg, Right lower leg, Left lower leg, Back Assist Level: 2 helpers  Function- Upper Body Dressing/Undressing What is the patient wearing?: Button up shirt Button up shirt - Perfomed by helper: Thread/unthread right sleeve, Thread/unthread left sleeve, Pull shirt around back, Button/unbutton shirt Function - Lower Body Dressing/Undressing What is the patient wearing?: Pants, Non-skid slipper socks Position: Sitting EOB Pants- Performed by helper: Thread/unthread right pants leg, Thread/unthread left pants leg, Pull pants up/down Non-skid slipper socks- Performed by helper: Don/doff right sock, Don/doff left sock Assist for footwear: Dependant Assist for lower body dressing: 2 Helpers  Function - Toileting Toileting activity did not occur: No continent bowel/bladder event     Function  - Chair/bed transfer Chair/bed transfer assist level: Maximal assist (Pt 25 - 49%/lift and lower) Chair/bed transfer assistive device: Armrests  Function - Locomotion: Wheelchair Wheelchair activity did not occur: Safety/medical concerns Wheel 50 feet with 2 turns activity did not occur: Safety/medical concerns Wheel 150 feet activity did not occur: Safety/medical concerns Function - Locomotion: Ambulation Assistive device: Rail in hallway Max distance: 25 Assist level: Maximal assist (Pt 25 - 49%) Walk 10 feet activity did not occur: Safety/medical concerns Assist level: Maximal assist (Pt 25 - 49%) Walk 50 feet with 2 turns activity did not occur: Safety/medical concerns Walk 150 feet activity did not occur: Safety/medical concerns Walk 10 feet on uneven surfaces activity did not occur: Safety/medical concerns  Function - Comprehension Comprehension: Auditory Comprehension assist level: Understands basic 25 - 49% of the time/ requires cueing 50 - 75% of the time  Function - Expression Expression: Verbal Expression assist level: Expresses basic 25 - 49% of the time/requires cueing 50 - 75% of the time. Uses single words/gestures.  Function - Social Interaction Social Interaction assist level: Interacts appropriately 50 - 74% of the time - May be physically or verbally inappropriate.  Function - Problem Solving Problem solving assist level: Solves basic 25 - 49% of the time - needs direction more than half the time to initiate, plan or complete simple activities  Function - Memory Memory assist level: Recognizes or recalls 25 - 49% of the time/requires cueing 50 - 75% of the time Patient normally able to recall (first 3 days only): That he or she is  in a hospital  Medical Problem List and Plan: 1.Decreased functional mobilitysecondary to left frontal intraparenchymal hemorrhage with recent deep brain stimulator for tremor. l   Cont CIR    Notes reviewed, images reviewed,  labs reviewed. 2. DVT Prophylaxis/Anticoagulation: SCDs. Monitor for any signs of DVT 3. Pain Management:Neurontin 300 mg 3 times a day, Tylenol as needed 4. Mood:Provide emotional support 5. Neuropsych: This patientisnot capable of making decisions onhisown behalf.   limit neuro-sedating meds when possible 6. Skin/Wound Care:Routine skin checks 7. Fluids/Electrolytes/Nutrition:Routine I&O's 8.Hypertension. Cozaar 100 mg daily, Lasix 20 mg daily. Vitals:   05/20/17 1947 05/21/17 0315  BP: 121/61 (!) 120/58  Pulse: 62 63  Resp:  18  Temp:  98.2 F (36.8 C)  SpO2:  97%    Controlled 2/4 9.CAD with stenting/CABG. No chest pain or shortness of breath 10.Hyperlipidemia. Lipitor,Lovaza' 11. Tremors: -mysoline and inderal ongoing -simulator not operational yet 12.  Dysphagia   Cont D2 thins    Advance diet as tolerated   CXR reviewed, unremarkable for acute process 13.  Hypoalb- Prostat 14. Prediabetes  Cont to monitor, will consider SSI if persistently elevated 15. ABLA   Hb 11.3 on 2/4   Cont to monitor  LOS (Days) 3 A FACE TO FACE EVALUATION WAS PERFORMED  Ankit Lorie Phenix 05/21/2017, 10:12 AM

## 2017-05-21 NOTE — IPOC Note (Signed)
Overall Plan of Care [facility]) Patient Details Name: Brendan [name] MRN: [id number] DOB: [birth year]  Admitting Diagnosis: <principal problem not specified>  Hospital Problems: Active Problems:   Intraparenchymal hemorrhage of brain (HCC)   Tremors of nervous system   Acute blood loss anemia   Prediabetes   Dysphagia   Benign essential HTN     Functional Problem List: Nursing Behavior, Bladder, Bowel, Edema, Endurance, Medication Management, Motor, Nutrition, Pain, Perception, Safety, Sensory, Skin Integrity  PT Balance, Endurance, Motor, Safety  OT Balance, Perception, Cognition, Safety, Edema, Sensory, Endurance, Skin Integrity, Motor, Vision  SLP Cognition, Endurance  TR         Basic ADL's: OT Eating, Grooming, Bathing, Dressing, Toileting     Advanced  ADL's: OT Simple Meal Preparation     Transfers: PT Bed Mobility, Bed to Chair, Customer service manager, Tub/Shower     Locomotion: PT Ambulation, Psychologist, prison and probation services, Stairs     Additional Impairments: OT Fuctional Use of Upper Extremity  SLP Swallowing, Communication, Social Cognition expression Social Interaction, Problem Solving, Memory, Attention, Awareness  TR      Anticipated Outcomes Item Anticipated Outcome  Self Feeding No goal  Swallowing  Supervision   Basic self-care  Min-Mod A  Toileting  Mod A   Bathroom Transfers Min A  Bowel/Bladder  min assist  Transfers  min A transfers  Locomotion  min A gait, stairs, S w/c mobility  Communication  Min A  Cognition  Min A  Pain  less<2  Safety/Judgment  min assist   Therapy Plan: PT Intensity: Minimum of 1-2 x/day ,45 to 90 minutes PT Frequency: 5 out of 7 days PT Duration Estimated Length of Stay: 21 to 24  OT Intensity: Minimum of 1-2 x/day, 45 to 90 minutes OT Frequency: 5 out of 7 days OT Duration/Estimated Length of Stay: 3-4 weeks SLP Intensity: Minumum of 1-2 x/day, 30 to 90 minutes SLP Frequency: 3 to 5 out of 7 days SLP  Duration/Estimated Length of Stay: 21 to 28 days    Team Interventions: Nursing Interventions Patient/Family Education, Bladder Management, Skin Care/Wound Management, Medication Management, Disease Management/Prevention, Cognitive Remediation/Compensation, Discharge Planning, Psychosocial Support, Dysphagia/Aspiration Precaution Training  PT interventions Ambulation/gait training, Warden/ranger, [employer] reintegration, Fish farm manager, Functional electrical stimulation, Neuromuscular re-education, Functional mobility training, Patient/family education, Stair training, Splinting/orthotics, Therapeutic Activities, Therapeutic Exercise, UE/LE Coordination activities, UE/LE Strength taining/ROM, Wheelchair propulsion/positioning  OT Interventions Warden/ranger, Discharge planning, Functional electrical stimulation, Pain management, Self Care/advanced ADL retraining, Therapeutic Activities, UE/LE Coordination activities, Cognitive remediation/compensation, Disease mangement/prevention, Functional mobility training, Skin care/wound managment, Patient/family education, Therapeutic Exercise, Visual/perceptual remediation/compensation, Wheelchair propulsion/positioning, UE/LE Strength taining/ROM, Splinting/orthotics, Psychosocial support, Neuromuscular re-education, DME/adaptive equipment instruction, Community reintegration  SLP Interventions Cognitive remediation/compensation, Financial trader, Dysphagia/aspiration precaution training, Functional tasks, Patient/family education, Therapeutic Activities, Speech/Language facilitation, Internal/external aids  TR Interventions    SW/CM Interventions Discharge Planning, Psychosocial Support, Patient/Family Education   Barriers to Discharge MD  Medical stability, Home enviroment access/loayout and Lack of/limited family support  Nursing      PT Inaccessible home environment, Decreased caregiver support    OT  Decreased caregiver support, Incontinence, Lack of/limited family support, Medical stability    SLP      SW       Team Discharge Planning: Destination: PT-Home ,OT- Home , SLP-Home Projected Follow-up: PT-Home health PT, OT-  Home health OT, Outpatient OT, Skilled nursing facility, 24 hour supervision/assistance, SLP-24 hour supervision/assistance, Home Health SLP Projected Equipment Needs: PT-To be determined, OT- To  be determined, SLP-None recommended by SLP Equipment Details: PT- , OT-  Patient/family involved in discharge planning: PT- Patient, Family member/caregiver,  OT-Family member/caregiver, Patient, SLP-Family member/caregiver  MD ELOS: 19-24 days. Medical Rehab Prognosis:  Good  Assessment: 77 y.o.right handedmalewith history of CAD with stenting as well as CABG, hypertension, memory loss and bilateral deep brain stimulator electrode placement per Dr. Venetia MaxonStern 05/04/2017 for essential tremors. Prior to need for skilled nursing facility placement patient lived with spouse. Needed assistance with self feeding and ADLs due to tremors. Family did encourage the use of a rolling walker but patient refused. Patient was discharged to [facility] skilled nursing facility 05/11/2017 ambulating minimal assist level. Presented 05/15/2017 with increasing right sided weakness. CT of the head showed interval evolution of recently seen intraparenchymal hematoma positioned within the left frontal lobe along the left deep brain stimulator with increased regional edema Negative CTA.EEG moderate diffuse slowing no seizure activity. Echocardiogramwith ejection fraction of 60% no wall motion abnormalities.Follow-up MRI 05/17/2017 with small postoperative hematoma after deep brain stimulator implant and continue to monitor. Maintained on a regular diet. Patient with resulting functional deficits with right hemiplegia, aphasia, mobility, self-care.  Will set goals for Min A for most tasks, Mod A for toileting with  PT/OT/SLP.  See Team Conference Notes for weekly updates to the plan of care

## 2017-05-21 NOTE — Progress Notes (Signed)
Ranelle Oyster, MD  Physician  Physical Medicine and Rehabilitation  Consult Note  Signed  Date of Service:  05/17/2017 5:57 AM       Related encounter: ED to Hosp-Admission (Discharged) from 05/15/2017 in [facility]      Signed      Expand All Collapse All       [] Hide copied text  [] Hover for details        Physical Medicine and Rehabilitation Consult Reason for Consult:Decreased functional mobility Referring Physician: Internal medicine   HPI: Brendan [name] is a 77 y.o.right handed male with history of CAD with stenting as well as CABG, hypertension, memory loss and bilateral deep brain stimulator electrode placement per Dr. [name] 05/04/2017 for essential tremors. Prior to need for skilled nursing facility placement patient lived with spouse. Needed assistance with self feeding and ADLs due to tremors.Family did encourage the use of a rolling walker but patient refused.Patient was discharged to [facility] skilled nursing facility 05/11/2017 ambulating minimal assist level. Presented 05/15/2017 with increasing right sided weakness. CT of the head showed interval evolution of recently seen intraparenchymal hematoma positioned within the left frontal lobe along the left deep brain stimulator with increased regional edema Negative CTA.EEG moderate diffuse slowing no seizure activity. Echocardiogram pending. Neurology follow-up with workup ongoing with MRI of the brain pending.Maintained on a regular diet.formal physical and occupational therapy evaluations pending. M.D. Has requested physical medicine rehabilitation consult.   Review of Systems  Constitutional: Negative for chills and fever.  HENT: Negative for hearing loss.   Eyes: Negative for blurred vision and double vision.  Respiratory: Negative for cough and shortness of breath.   Cardiovascular: Positive for leg swelling. Negative for chest pain and palpitations.  Gastrointestinal: Positive  for constipation. Negative for nausea and vomiting.       GERD  Genitourinary: Positive for urgency. Negative for dysuria, flank pain and hematuria.  Musculoskeletal: Positive for myalgias.  Skin: Negative for rash.  Neurological: Positive for tremors and focal weakness.  Psychiatric/Behavioral: Positive for memory loss.       Anxiety disorder  All other systems reviewed and are negative.      Past Medical History:  Diagnosis Date  . Anxiety disorder   . Benign enlargement of prostate   . CAD in native artery 1997   Referred for CABG x3 in 2004 for LAD diagonal bifurcation lesion  . Cervical spinal stenosis   . Complex regional pain syndrome of right upper extremity    Right wrist; L arm  . Coronary atherosclerosis of artery bypass graft June 2013   Occluded SVG-D1; Cardiologist Dr. Herbie Baltimore  . Gait disorder   . Gastroesophageal reflux disease   . Glaucoma   . Hiatal hernia    GI: Dr Evette Cristal  . Hyperlipidemia LDL goal <70   . Hypertension   . Memory loss    Mild  . Obesity   . Peripheral neuropathy    possible peripheral neuropathy  . S/P CABG x 3 2004    LIMA-LAD, SVG to diagonal, SVG to OM  . Tremor, essential    On Primidone  . Unstable angina pectoris [facility])  June 2013   Cardiac cath: occluded SVG-DI. Patent LIMA-LAD and SVG-OM. EF 45% with apical inferior HK.        Past Surgical History:  Procedure Laterality Date  . CARDIAC CATHETERIZATION    . CATARACT EXTRACTION, BILATERAL    . CORONARY ARTERY BYPASS GRAFT  06/2002   LIMA-LAD,  SVG-D1, SVG-OM  . EYE SURGERY    . KNEE SURGERY Left   . LEFT HEART CATHETERIZATION WITH CORONARY/GRAFT ANGIOGRAM N/A 10/13/2011   Procedure: LEFT HEART CATHETERIZATION WITH Isabel CapriceORONARY/GRAFT ANGIOGRAM;  Surgeon: Marykay Lexavid W Harding, MD;  [facility];  Service: Cardiovascular;  Laterality: N/A;  . MINOR PLACEMENT OF FIDUCIAL N/A 04/26/2017   Procedure: Fiducial placement;  Surgeon: Maeola HarmanStern, Joseph, MD;   [facility];  Service: Neurosurgery;  Laterality: N/A;  Fiducial placement  . NM MYOCAR PERF WALL MOTION  03/23/2009   protocol:Bruce, normal perfusion in all regions, post-stress EF 72%, exercise capacity 7METS, EKG negative for ischemia.  . Shoulder Orthoscopic Surgery   06/2003  . SUBTHALAMIC STIMULATOR INSERTION Bilateral 05/04/2017   Procedure: Bilateral Deep brain stimulator placement;  Surgeon: Maeola HarmanStern, Joseph, MD;  [facility];  Service: Neurosurgery;  Laterality: Bilateral;  Bilateral deep brain stimulator placement  . TRANSTHORACIC ECHOCARDIOGRAM  April 2014   EF 55-60%; no regional wall motion abnormalities. Relatively normal study normal diastolic parameters/abnormal relaxation  . wrist ganglion cyst Left         Family History  Problem Relation Age of Onset  . Heart disease Mother   . Heart disease Brother   . Tremor Paternal Uncle   . Heart disease Brother   . Drug abuse Daughter   . Hepatitis C Daughter    Social History:  reports that he quit smoking about 49 years ago. His smoking use included cigarettes. He has a 60.00 pack-year smoking history. he has never used smokeless tobacco. He reports that he does not drink alcohol or use drugs. Allergies:      Allergies  Allergen Reactions  . Lisinopril Cough         Medications Prior to Admission  Medication Sig Dispense Refill  . atorvastatin (LIPITOR) 40 MG tablet Take 1 tablet (40 mg total) by mouth daily. 90 tablet 3  . fish oil-omega-3 fatty acids 1000 MG capsule Take 1 g by mouth 2 (two) times daily.    . furosemide (LASIX) 20 MG tablet Take 1 tablet (20 mg total) by mouth daily. May take an  Extra dose if needed (Patient taking differently: Take 20 mg by mouth daily. ) 35 tablet 11  . HYDROcodone-acetaminophen (NORCO/VICODIN) 5-325 MG tablet Take 1 tablet by mouth every 6 (six) hours as needed for moderate pain.    [name]. ketoconazole (NIZORAL) 2 % shampoo Apply 1 application topically 2 (two)  times a week. (Patient taking differently: Apply 1 application topically once a week. ) 120 mL 1  . loratadine (CLARITIN) 10 MG tablet Take 1 tablet (10 mg total) by mouth daily. 30 tablet 11  . losartan (COZAAR) 100 MG tablet Take 1 tablet (100 mg total) by mouth daily. 90 tablet 3  . nitroGLYCERIN (NITROSTAT) 0.4 MG SL tablet Place 1 tablet (0.4 mg total) under the tongue every 5 (five) minutes as needed for chest pain. call 911 if chest pain not better 30 tablet 3  . omeprazole (PRILOSEC) 20 MG capsule Take 1 capsule (20 mg total) by mouth daily as needed (acid reflux). 90 capsule 3  . propranolol (INDERAL) 80 MG tablet Take 1 tablet (80 mg total) by mouth 2 (two) times daily. 180 tablet 5  . Propylene Glycol (SYSTANE BALANCE) 0.6 % SOLN Place 1 drop into both eyes daily as needed (for dry eyes).    . sennosides-docusate sodium (SENOKOT-S) 8.6-50 MG tablet Take 1 tablet by mouth daily as needed for constipation.    .[name]  gabapentin (NEURONTIN) 300 MG capsule Take 2 capsules (600 mg total) by mouth 3 (three) times daily. (Patient taking differently: Take 600 mg by mouth at bedtime. x3 days. Then stop on 05/18/17.) 180 capsule 5  . primidone (MYSOLINE) 250 MG tablet Take 250 mg by mouth 2 (two) times daily.    . primidone (MYSOLINE) 50 MG tablet 04/19/17: 250 mg am, 200 mg hs; 04/20/17: 200 mg am, 200 mg hs, 04/21/17: 200 mg am, 150 mg hs; 1/6-1/7: 150 mg BID; 1/8-1/9: 150 mg am, 100 mg hs; 1/10-1/11: 100 mg BID; 1/12-1/13: 100 mg am, 50 mg hs; 1/14: 50 mg BID, 1/15: 50 mg QD, 1/16: Stop (Patient not taking: Reported on 05/15/2017) 60 tablet 0    Home: Home Living Family/patient expects to be discharged to:: Private residence Living Arrangements: Alone Available Help at Discharge: Family, Available 24 hours/day Type of Home: House Home Access: Stairs to enter Secretary/administrator of Steps: 4 Entrance Stairs-Rails: Can reach both Home Layout: One level Bathroom Shower/Tub: Tub/shower unit,  Health visitor: Standard Home Equipment: Environmental consultant - 2 wheels, Medical laboratory scientific officer - single point  Lives With: Spouse  Functional History: Prior Function Level of Independence: Independent Gait / Transfers Assistance Needed: daughter reports he was very independent PTA  ADL's / Homemaking Assistance Needed: Assist with self-feeding and ADL due to tremors.  Functional Status:  Mobility: Bed Mobility Overal bed mobility: Needs Assistance Bed Mobility: Supine to Sit Supine to sit: Mod assist General bed mobility comments: needs help powering up trunk/pivoting to EOB safely due to sequencing difficulty  Transfers Overall transfer level: Needs assistance Equipment used: Rolling walker (2 wheeled) Transfers: Sit to/from Stand Sit to Stand: Min assist General transfer comment: VC/TC for technique  Ambulation/Gait Ambulation/Gait assistance: Min assist Ambulation Distance (Feet): 20 Feet Assistive device: Rolling walker (2 wheeled) Gait Pattern/deviations: Step-through pattern, Decreased step length - right, Decreased step length - left, Decreased dorsiflexion - right, Decreased dorsiflexion - left, Shuffle, Trunk flexed General Gait Details: MinA to navigate with walker especially on turns, VC for navigation in room   ADL:  Cognition: Cognition Overall Cognitive Status: Impaired/Different from baseline Arousal/Alertness: Awake/alert Orientation Level: Oriented to person, Oriented to place, Disoriented to time, Disoriented to situation Attention: Sustained Sustained Attention: Impaired Sustained Attention Impairment: Verbal basic, Functional basic Memory: Impaired Memory Impairment: Decreased recall of new information Awareness: Impaired Awareness Impairment: Intellectual impairment Problem Solving: Impaired Problem Solving Impairment: Verbal basic Cognition Arousal/Alertness: Awake/alert Behavior During Therapy: WFL for tasks assessed/performed, Flat affect Overall Cognitive  Status: Impaired/Different from baseline Area of Impairment: Safety/judgement, Problem solving Memory: Decreased recall of precautions, Decreased short-term memory Following Commands: Follows one step commands consistently, Follows one step commands with increased time Problem Solving: Slow processing, Decreased initiation, Difficulty sequencing, Requires verbal cues, Requires tactile cues Difficult to assess due to: Impaired communication  Blood pressure 133/63, pulse 79, temperature 98.3 F (36.8 C), resp. rate 18, height 5\' 9"  (1.753 m), weight 88.9 kg (195 lb 15.8 oz), SpO2 98 %. Physical Exam  Vitals reviewed. HENT:  Head: Normocephalic.  Eyes:  Pupils reactive to light  Neck: Normal range of motion. Neck supple. No thyromegaly present.  Cardiovascular: Normal rate, regular rhythm and normal heart sounds.  Respiratory: Effort normal and breath sounds normal. No respiratory distress.  GI: Soft. Bowel sounds are normal. He exhibits no distension.  Neurological:  Patient is alert. Pleasantly confused. He stated he was 77 years old. He did follow simple commands. Appropriate for simple yes and no questions.  Ongoing left-sided tremor.  Left upper extremity grossly 4 out of 5.  Right lower extremity is 1+ to 2 out of 5 depending upon the amount he is able to engage the limb.  Right lower extremity is 3+ to 4 out of 5 grossly.  Sensation diminished slightly right arm and leg but able to sense pain.  Mild right inattention.  Patient with decreased sitting balance and needed cues for posture.  Stood up with walker and had difficulties grabbing the walker due to his right arm weakness and inattention but did fairly well although with a shuffling gait while walking with physical therapy  Skin: Skin is warm and dry.            Assessment/Plan: Diagnosis: Left frontal intraparenchymal hemorrhage status post recent deep brain stimulator for tremor 1. Does the need for close, 24 hr/day medical  supervision in concert with the patient's rehab needs make it unreasonable for this patient to be served in a less intensive setting? Yes 2. Co-Morbidities requiring supervision/potential complications: History of hypertension, memory loss, CAD s/p CABG, post ICH sequelae 3. Due to bladder management, bowel management, safety, skin/wound care, disease management, medication administration, pain management and patient education, does the patient require 24 hr/day rehab nursing? Yes 4. Does the patient require coordinated care of a physician, rehab nurse, PT (1-2 hrs/day, 5 days/week), OT (1-2 hrs/day, 5 days/week) and SLP (1-2 hrs/day, 5 days/week) to address physical and functional deficits in the context of the above medical diagnosis(es)? Yes Addressing deficits in the following areas: balance, endurance, locomotion, strength, transferring, bowel/bladder control, bathing, dressing, feeding, grooming, toileting, cognition, speech and psychosocial support 5. Can the patient actively participate in an intensive therapy program of at least 3 hrs of therapy per day at least 5 days per week? Yes 6. The potential for patient to make measurable gains while on inpatient rehab is excellent 7. Anticipated functional outcomes upon discharge from inpatient rehab are supervision  with PT, supervision and min assist with OT, supervision with SLP. 8. Estimated rehab length of stay to reach the above functional goals is: 8-11 days 9. Anticipated D/C setting: Home 10. Anticipated post D/C treatments: HH therapy and Outpatient therapy 11. Overall Rehab/Functional Prognosis: excellent  RECOMMENDATIONS: This patient's condition is appropriate for continued rehabilitative care in the following setting: CIR Patient has agreed to participate in recommended program. Potentially/yes Note that insurance prior authorization may be required for reimbursement for recommended care.  Comment: Rehab Admissions Coordinator to  follow up.  Thanks,  Ranelle Oyster, MD, [place] Dom    Mcarthur Rossetti Angiulli, PA-C 05/17/2017          Revision History                        Routing History

## 2017-05-21 NOTE — Progress Notes (Signed)
Orthopedic Tech Progress Note Patient Details:  Brendan [name] 03/21/1941 [id number]  Ortho Devices Type of Ortho Device: Abdominal binder Ortho Device/Splint Location: Provided Abdominal Binder as requested.   (Floor nurse at bedside)  .Abdominal Binder provided       [name], Brendan [name] 05/21/2017, 12:02 PM

## 2017-05-21 NOTE — Progress Notes (Signed)
Physical Therapy Session Note  Patient Details  Name: Brendan [name] MRN: [id number] Date of Birth: 03/09/1941  Today's Date: 05/21/2017 PT Individual Time: 0915-1000 PT Individual Time Calculation (min): 45 min   Short Term Goals: Week 1:  PT Short Term Goal 1 (Week 1): Pt will increase bed mobility to mod A. PT Short Term Goal 2 (Week 1): Pt will increase transfers to mod A.  PT Short Term Goal 3 (Week 1): Pt will ambulate with LRAD about 25 feet with mod A.  PT Short Term Goal 4 (Week 1): Pt will ascend/descend 4 stairs with B rails and max A.  PT Short Term Goal 5 (Week 1): Pt will propel w/c about 25 feet with min A.   Skilled Therapeutic Interventions/Progress Updates:    Pt sitting in w/c upon arrival with daughter [name] present. Pt able to name daughter but unable to word find daughter. Pt agreeable to PT session. Working on w/c mobility including parts management and propulsion. Pt requiring min assist for guiding rw with pt propelling with Lt UE. Pt unable to use Lt LE for assist despite cues. Gait: 25 ft with hall rail, 20 ft with rw, 55 ft with rw. Transfers: mod assist sit<>stand with block attempts. Following session, pt returned to room with QRB on and all needs in reach. Family present.    Therapy Documentation Precautions:  Precautions Precautions: Fall Precaution Comments: Lt gaze preference, Rt hemi, aphasic, R inattention Restrictions Weight Bearing Restrictions: No Pain: Receiving pain meds during session for headache.   See Function Navigator for Current Functional Status.   Therapy/Group: Individual Therapy  Delton SeeBenjamin Tonye Tancredi, PT 05/21/2017, 2:08 PM

## 2017-05-22 ENCOUNTER — Inpatient Hospital Stay (HOSPITAL_COMMUNITY): Payer: PPO | Admitting: Occupational Therapy

## 2017-05-22 ENCOUNTER — Inpatient Hospital Stay (HOSPITAL_COMMUNITY): Payer: PPO | Admitting: Speech Pathology

## 2017-05-22 ENCOUNTER — Inpatient Hospital Stay (HOSPITAL_COMMUNITY): Payer: PPO | Admitting: *Deleted

## 2017-05-22 ENCOUNTER — Inpatient Hospital Stay (HOSPITAL_COMMUNITY): Payer: PPO

## 2017-05-22 DIAGNOSIS — R410 Disorientation, unspecified: Secondary | ICD-10-CM

## 2017-05-22 DIAGNOSIS — G479 Sleep disorder, unspecified: Secondary | ICD-10-CM

## 2017-05-22 DIAGNOSIS — I952 Hypotension due to drugs: Secondary | ICD-10-CM

## 2017-05-22 LAB — URINALYSIS, COMPLETE (UACMP) WITH MICROSCOPIC
Bacteria, UA: NONE SEEN
Bilirubin Urine: NEGATIVE
Glucose, UA: NEGATIVE mg/dL
Ketones, ur: NEGATIVE mg/dL
Leukocytes, UA: NEGATIVE
Nitrite: NEGATIVE
Protein, ur: NEGATIVE mg/dL
Specific Gravity, Urine: 1.01 (ref 1.005–1.030)
Squamous Epithelial / HPF: NONE SEEN
pH: 7 (ref 5.0–8.0)

## 2017-05-22 MED ORDER — WHITE PETROLATUM EX OINT
TOPICAL_OINTMENT | CUTANEOUS | Status: AC
  Administered 2017-05-22: 07:00:00
  Filled 2017-05-22: qty 28.35

## 2017-05-22 MED ORDER — LOSARTAN POTASSIUM 50 MG PO TABS
50.0000 mg | ORAL_TABLET | Freq: Every day | ORAL | Status: DC
  Administered 2017-05-23 – 2017-05-28 (×6): 50 mg via ORAL
  Filled 2017-05-22 (×6): qty 1

## 2017-05-22 MED ORDER — TRAZODONE HCL 50 MG PO TABS
50.0000 mg | ORAL_TABLET | Freq: Every day | ORAL | Status: DC
  Administered 2017-05-22 – 2017-05-23 (×2): 50 mg via ORAL
  Filled 2017-05-22 (×2): qty 1

## 2017-05-22 NOTE — Progress Notes (Signed)
Physical Therapy Session Note  Patient Details  Name: Brendan [name] MRN: [id number] Date of Birth: 08/29/1940  Today's Date: 05/22/2017 PT Individual Time: 1300-1415 PT Individual Time Calculation (min): 75 min   Short Term Goals: Week 1:  PT Short Term Goal 1 (Week 1): Pt will increase bed mobility to mod A. PT Short Term Goal 2 (Week 1): Pt will increase transfers to mod A.  PT Short Term Goal 3 (Week 1): Pt will ambulate with LRAD about 25 feet with mod A.  PT Short Term Goal 4 (Week 1): Pt will ascend/descend 4 stairs with B rails and max A.  PT Short Term Goal 5 (Week 1): Pt will propel w/c about 25 feet with min A.   Skilled Therapeutic Interventions/Progress Updates:    Patient in recliner in room with wife presents.  Transfer to w/c mod A no walker with cues and assist for balance, R side awareness going to R.  Patient in w/c propelled x 100' with min A at times for direction, mod to max cues to use LE's due to running into walls with pushing with L hand only.  Patient sit to stand to RW to pivot to mat with  Min A and cues.  Sit to stand x 5 no UE support from mat min A.  Seated R UE PROM for tone reduction and ROM due to stiffness and pt reported pain with attempts at ER.  Trunk extension seated with self ROM R UE using L UE with guidance for motion (D2 pattern).  Patient ambulated x 50', 30' x 2 with RW and mod A due to R lateral weight shift and R LE weakness with decreased awareness risk for LOB to R.  Standing endurance/balance activity playing checkers with max cues for R attention and for rules of the game.  Patient fatigued after about 2-3 minutes for 3 bouts standing with increased assist for balance due to R lateral lean up to mod A, initially min A.  Patient negotiated 4 3" steps with rail and mod to max A difficulty descending due to fatigue and difficulty turning on stairs so side stepped to L max cues for R LE management and to prevent R lateral LOB.  Patient in w/c assisted to  room.  Assisted to pivot to bed per RN for in/out cath.  Sit to supine mod A for LE guidance into bed and positioning.  Patient in supine with wife in room and bed alarm on .   Therapy Documentation Precautions:  Precautions Precautions: Fall Precaution Comments: Lt gaze preference, Rt hemi, aphasic, R inattention Restrictions Weight Bearing Restrictions: No Pain: Pain Assessment Faces Pain Scale: Hurts little more Pain Location: Shoulder Pain Orientation: Right Pain Descriptors / Indicators: Aching;Tightness Pain Onset: With Activity Pain Intervention(s): Rest;Repositioned   See Function Navigator for Current Functional Status.   Therapy/Group: Individual Therapy  Elray McgregorCynthia Wynn 05/22/2017, 4:39 PM

## 2017-05-22 NOTE — Progress Notes (Signed)
Subjective/Complaints: Pt seen lying in bed this AM.  He did not sleep well overnight per nursing and was confused after waking up early.  Yesterday he was noted to have increase confusion after episode of hypotension.  He is able to pick between choices this AM.   ROS: Unable to assess due to aphasia.  Objective: Vital Signs: Blood pressure (!) 104/51, pulse 65, temperature 97.7 F (36.5 C), temperature source Oral, resp. rate 17, height '5\' 9"'$  (1.753 m), weight 92 kg (202 lb 12.8 oz), SpO2 96 %. Dg Chest 2 View  Result Date: 05/21/2017 CLINICAL DATA:  Dizziness EXAM: CHEST  2 VIEW COMPARISON:  05/19/2017 FINDINGS: Postop CABG.  Negative for heart failure.  Negative for pneumonia. Calcified pleural plaque right anterior upper lobe and left lower lobe posterolaterally unchanged from prior CT 07/14/2016. No superimposed acute infiltrate or effusion. IMPRESSION: Chronic pleural plaques bilaterally.  No acute abnormality. Electronically Signed   By: Franchot Gallo M.D.   On: 05/21/2017 12:40   Results for orders placed or performed during the hospital encounter of 05/18/17 (from the past 72 hour(s))  Glucose, capillary     Status: Abnormal   Collection Time: 05/19/17  4:57 PM  Result Value Ref Range   Glucose-Capillary 198 (H) 65 - 99 mg/dL  CBC WITH DIFFERENTIAL     Status: Abnormal   Collection Time: 05/21/17  4:31 AM  Result Value Ref Range   WBC 5.7 4.0 - 10.5 K/uL   RBC 3.34 (L) 4.22 - 5.81 MIL/uL   Hemoglobin 11.3 (L) 13.0 - 17.0 g/dL   HCT 32.7 (L) 39.0 - 52.0 %   MCV 97.9 78.0 - 100.0 fL   MCH 33.8 26.0 - 34.0 pg   MCHC 34.6 30.0 - 36.0 g/dL   RDW 13.8 11.5 - 15.5 %   Platelets 199 150 - 400 K/uL   Neutrophils Relative % 64 %   Neutro Abs 3.7 1.7 - 7.7 K/uL   Lymphocytes Relative 28 %   Lymphs Abs 1.6 0.7 - 4.0 K/uL   Monocytes Relative 6 %   Monocytes Absolute 0.3 0.1 - 1.0 K/uL   Eosinophils Relative 2 %   Eosinophils Absolute 0.1 0.0 - 0.7 K/uL   Basophils Relative 0 %    Basophils Absolute 0.0 0.0 - 0.1 K/uL    Comment: Performed at [facility], [address], [place], [place] [address]  Comprehensive metabolic panel     Status: Abnormal   Collection Time: 05/21/17  4:31 AM  Result Value Ref Range   Sodium 136 135 - 145 mmol/L   Potassium 4.4 3.5 - 5.1 mmol/L   Chloride 101 101 - 111 mmol/L   CO2 23 22 - 32 mmol/L   Glucose, Bld 172 (H) 65 - 99 mg/dL   BUN 15 6 - 20 mg/dL   Creatinine, Ser 1.01 0.61 - 1.24 mg/dL   Calcium 9.1 8.9 - 10.3 mg/dL   Total Protein 6.1 (L) 6.5 - 8.1 g/dL   Albumin 3.1 (L) 3.5 - 5.0 g/dL   AST 21 15 - 41 U/L   ALT 33 17 - 63 U/L   Alkaline Phosphatase 88 38 - 126 U/L   Total Bilirubin 0.8 0.3 - 1.2 mg/dL   GFR calc non Af Amer >60 >60 mL/min   GFR calc Af Amer >60 >60 mL/min    Comment: (NOTE) The eGFR has been calculated using the CKD EPI equation. This calculation has not been validated in all clinical situations.  eGFR's persistently <60 mL/min signify possible Chronic Kidney Disease.    Anion gap 12 5 - 15    Comment: Performed at [facility] [address], [place], [place] [address]  Glucose, capillary     Status: Abnormal   Collection Time: 05/21/17 11:35 AM  Result Value Ref Range   Glucose-Capillary 186 (H) 65 - 99 mg/dL    General: NAD. Well-developed.  HEENT: Normocephalic, atraumatic. Cardio: RRR and no JVD. Resp: CTA B/L and Unlabored GI: BS positive and ND Musc/Skel:  No edema, no tenderness. Neuro: Alert and Oriented x0 Expressive >> Receptive aphasia Motor: LUE/LLE: Grossly 5/5 proximal to distal RUE/RLE: 0/5 proximal to distal (unchanged) LUE tremor Dysarthria Skin:   Intact. Warm and dry   Assessment/Plan: 1. Functional deficits secondary to left frontal parietal intraparenchymal hemorrhage with right hemiparesis, aphasia and cognitive deficits which require 3+ hours per day of interdisciplinary therapy in a comprehensive inpatient rehab setting. Physiatrist is providing  close team supervision and 24 hour management of active medical problems listed below. Physiatrist and rehab team continue to assess barriers to discharge/monitor patient progress toward functional and medical goals. FIM: Function - Bathing Position: Sitting EOB Body parts bathed by patient: Chest, Abdomen, Front perineal area, Left upper leg Body parts bathed by helper: Right arm, Left arm, Buttocks, Right upper leg, Right lower leg, Left lower leg, Back Assist Level: 2 helpers  Function- Upper Body Dressing/Undressing What is the patient wearing?: Button up shirt Button up shirt - Perfomed by helper: Thread/unthread right sleeve, Thread/unthread left sleeve, Pull shirt around back, Button/unbutton shirt Function - Lower Body Dressing/Undressing What is the patient wearing?: Pants, Non-skid slipper socks Position: Sitting EOB Pants- Performed by patient: Thread/unthread left pants leg Pants- Performed by helper: Thread/unthread right pants leg, Pull pants up/down Non-skid slipper socks- Performed by helper: Don/doff right sock, Don/doff left sock Assist for footwear: Dependant Assist for lower body dressing: (1 helper)  Function - Toileting Toileting activity did not occur: No continent bowel/bladder event Toileting steps completed by helper: Adjust clothing prior to toileting, Performs perineal hygiene, Adjust clothing after toileting Assist level: Two helpers(per Thea Alken, NT report)  Function - Air cabin crew transfer activity did not occur: (not attempted) Assist level to toilet: 2 helpers(per Thea Alken, NT report)  Function - Chair/bed transfer Chair/bed transfer assist level: Moderate assist (Pt 50 - 74%/lift or lower) Chair/bed transfer assistive device: Walker, Armrests  Function - Locomotion: Oceanographer activity did not occur: Safety/medical concerns Max wheelchair distance: 100 ft Assist Level: Touching or steadying assistance (Pt >  75%) Wheel 50 feet with 2 turns activity did not occur: Safety/medical concerns Assist Level: Touching or steadying assistance (Pt > 75%) Wheel 150 feet activity did not occur: Safety/medical concerns Function - Locomotion: Ambulation Assistive device: Walker-rolling Max distance: 55 ft Assist level: Moderate assist (Pt 50 - 74%) Walk 10 feet activity did not occur: Safety/medical concerns Assist level: Moderate assist (Pt 50 - 74%) Walk 50 feet with 2 turns activity did not occur: Safety/medical concerns Assist level: Moderate assist (Pt 50 - 74%) Walk 150 feet activity did not occur: Safety/medical concerns Walk 10 feet on uneven surfaces activity did not occur: Safety/medical concerns  Function - Comprehension Comprehension: Auditory Comprehension assist level: Understands basic 25 - 49% of the time/ requires cueing 50 - 75% of the time  Function - Expression Expression: Verbal Expression assist level: Expresses basic 25 - 49% of the time/requires cueing 50 - 75% of the time. Uses  single words/gestures.  Function - Social Interaction Social Interaction assist level: Interacts appropriately 50 - 74% of the time - May be physically or verbally inappropriate.  Function - Problem Solving Problem solving assist level: Solves basic 25 - 49% of the time - needs direction more than half the time to initiate, plan or complete simple activities  Function - Memory Memory assist level: Recognizes or recalls 25 - 49% of the time/requires cueing 50 - 75% of the time Patient normally able to recall (first 3 days only): That he or she is in a hospital  Medical Problem List and Plan: 1.Decreased functional mobilitysecondary to left frontal intraparenchymal hemorrhage with recent deep brain stimulator for tremor.    Cont CIR 2. DVT Prophylaxis/Anticoagulation: SCDs. Monitor for any signs of DVT 3. Pain Management:Neurontin 300 mg 3 times a day, Tylenol as needed 4. Mood:Provide emotional  support 5. Neuropsych: This patientisnot capable of making decisions onhisown behalf.   limit neuro-sedating meds when possible 6. Skin/Wound Care:Routine skin checks 7. Fluids/Electrolytes/Nutrition:Routine I&O's 8.Hypertension.    Cozaar 100 mg daily, decreased to 50 on 2/5   Lasix 20 mg daily d/ced on 2/5. Vitals:   05/21/17 2119 05/22/17 0112  BP: (!) 101/53 (!) 104/51  Pulse: 76 65  Resp:  17  Temp: 98.3 F (36.8 C) 97.7 F (36.5 C)  SpO2:      Hypotensive 2/5 9.CAD with stenting/CABG. No chest pain or shortness of breath 10.Hyperlipidemia. Lipitor,Lovaza' 11. Tremors:   mysoline and inderal ongoing   simulator not operational yet 12.  Dysphagia   Cont D2 thins    Advance diet as tolerated   CXR reviewed, unremarkable for acute process 13.  Hypoalb- Prostat 14. Prediabetes  Cont to monitor, will consider SSI if persistently elevated 15. ABLA   Hb 11.3 on 2/4   Cont to monitor 16. Confusion  Multifactorial due hypotension yesterday and sleep/wake changes   CXR reviewed, unremarkable for acute changes   UA/Ucx ordered 17. Sleep disturbance  Trazodone 50 qhs scheduled on 2/5  LOS (Days) 4 A FACE TO FACE EVALUATION WAS PERFORMED  Rocsi Hazelbaker Lorie Phenix 05/22/2017, 9:07 AM

## 2017-05-22 NOTE — Progress Notes (Addendum)
Occupational Therapy Session Note  Patient Details  Name: Brendan [name] MRN: [id number] Date of Birth: 01/08/1941  Today's Date: 05/22/2017 OT Individual Time: 1000-1100 OT Individual Time Calculation (min): 60 min    Short Term Goals: Week 1:  OT Short Term Goal 1 (Week 1): Pt will complete 1/4 components of donning shirt OT Short Term Goal 2 (Week 1): Pt will complete toilet transfer with Mod A 1 helper OT Short Term Goal 3 (Week 1): Pt will scan to the Rt for 1 ADL item with mod vcs OT Short Term Goal 4 (Week 1): Pt will initiate washing 3/11 body areas during bathing   Skilled Therapeutic Interventions/Progress Updates:    Pt received supine in bed agreeable to OT tx session, no c/o pain. Pt requires ModA for transfer supine>sitting EOB with increased time provided for Pt to initiate and assist with transfer; requires ModA for stand pivot EOB>w/c, with increased time, multimodal cues and assist for sequencing RW use. Pt completing bathing/dressing seated in w/c at sink. Pt requires cues to initiate washing face and UB, initiates washing LLE without assist. Max HOH assist provided to wash LUE using RUE; Pt completing sit<>stand at sink with ModA and ModA to maintain static standing while therapist washes buttocks. Pt dons button up shirt, threading LUE into shirt with verbal cues to initiate, completing with overall MaxA. Total assist for footwear and pants this session due to time constraints with ModA provided for sit<>stand and static standing while therapist advances pants over hips. During ADL completion Pt requires cues and intermittent assist to correct lateral lean, with use of visual feedback from mirror to assist Pt with self-correcting. Pt completing stand pivot w/c>recliner using RW with ModA, requiring tactile/verbal cues for advancing LEs during transfer completion. Pt left positioned in recliner end of session with spouse present, handoff to SLP preparing to enter room for tx  session.   Therapy Documentation Precautions:  Precautions Precautions: Fall Precaution Comments: Lt gaze preference, Rt hemi, aphasic, R inattention Restrictions Weight Bearing Restrictions: No     ADL: ADL ADL Comments: Please see functional navigator for ADL status  See Function Navigator for Current Functional Status.   Therapy/Group: Individual Therapy  [name] 05/22/2017, 3:47 PM

## 2017-05-22 NOTE — Progress Notes (Signed)
Recreational Therapy Session Note  Patient Details  Name: Brendan [name] MRN: [id number] Date of Birth: 1941-01-31 Today's Date: 05/22/2017   MEt with pt and wife today to discuss TR services.  Pt with little leisure interest PTA per wife and has low activity tolerance.  Eval deferred at this time.  Will continue to monitor for future participation through team. [place] 05/22/2017, 4:49 PM

## 2017-05-22 NOTE — Progress Notes (Signed)
Speech Language Pathology Daily Session Note  Patient Details  Name: Brendan [name] Reindel MRN: [id number] Date of Birth: 03/23/1941  Today's Date: 05/22/2017 SLP Individual Time: 1105-1200 SLP Individual Time Calculation (min): 55 min  Short Term Goals: Week 1: SLP Short Term Goal 1 (Week 1): Pt will sustain attention to basic familiar task  for ~ 15 minutes with Mod A cues.  SLP Short Term Goal 2 (Week 1): Pt will name common objects with ~ 90% accuracy and Min A cues.  SLP Short Term Goal 3 (Week 1): Pt will increase speech intelligibility at the simple phrase level to achieve ~ 75% intelligibility given Mod A cues for use of speech intelligibility strategies.  SLP Short Term Goal 4 (Week 1): Pt will complete basic familiar problem solving tasks related to ADLs with Mod A cues.  SLP Short Term Goal 5 (Week 1): Pt will consume current diet with minimal overt s/s of aspiration and Min A cues for use of compensatory swallow strategies.   Skilled Therapeutic Interventions:  Pt was seen for skilled ST targeting goals for communication and dysphagia.  [name] and RN report that pt had significant coughing with breakfast this AM to the point where pt had to cease eating meal.  However, this afternoon pt demonstrated no overt s/s of aspiration with dys 2 solids or liquids, even when challenged to take large consecutive sips of liquids via straw.  Wife indicates that overall pt's coughing with meals is minimal and that coughing with breakfast was abnormal for pt.  Wife reports that her daughter indicates that pt has good toleration of solids and liquids under her supervision as well.   Will recommend that pt continue on his currently prescribed diet with close ST follow up for management of safe diet progression.  SLP also facilitated the session with various language tasks to elicit naming.  Pt was able to generate the names of objects which were opposites to a targeted word for ~75% accuracy with min assist.   He needed up to mod assist for phrase level expression during sentence completion tasks from a written model.  Pt needed max assist multimodal cues to increase awareness of how his decreased vocal intensity impacts intelligibility; however, pt made little to no attempts to modulate his behavior to improve intelligibility.  Pt was left seated in a recliner with his wife at bedside.  Continue per current plan of care.    Function:  Eating Eating   Modified Consistency Diet: Yes Eating Assist Level: Supervision or verbal cues           Cognition Comprehension Comprehension assist level: Understands basic 50 - 74% of the time/ requires cueing 25 - 49% of the time  Expression   Expression assist level: Expresses basic 50 - 74% of the time/requires cueing 25 - 49% of the time. Needs to repeat parts of sentences.  Social Interaction Social Interaction assist level: Interacts appropriately 50 - 74% of the time - May be physically or verbally inappropriate.  Problem Solving Problem solving assist level: Solves basic 25 - 49% of the time - needs direction more than half the time to initiate, plan or complete simple activities  Memory Memory assist level: Recognizes or recalls 25 - 49% of the time/requires cueing 50 - 75% of the time    Pain Pain Assessment Pain Assessment: No/denies pain  Therapy/Group: Individual Therapy  Gwenevere Goga, Melanee SpryNicole L 05/22/2017, 8:41 PM

## 2017-05-23 ENCOUNTER — Inpatient Hospital Stay (HOSPITAL_COMMUNITY): Payer: PPO | Admitting: Occupational Therapy

## 2017-05-23 ENCOUNTER — Inpatient Hospital Stay (HOSPITAL_COMMUNITY): Payer: PPO | Admitting: Speech Pathology

## 2017-05-23 ENCOUNTER — Inpatient Hospital Stay (HOSPITAL_COMMUNITY): Payer: PPO | Admitting: Physical Therapy

## 2017-05-23 LAB — GLUCOSE, CAPILLARY
Glucose-Capillary: 192 mg/dL — ABNORMAL HIGH (ref 65–99)
Glucose-Capillary: 132 mg/dL — ABNORMAL HIGH (ref 65–99)
Glucose-Capillary: 184 mg/dL — ABNORMAL HIGH (ref 65–99)

## 2017-05-23 LAB — URINE CULTURE: Culture: NO GROWTH

## 2017-05-23 MED ORDER — INSULIN ASPART 100 UNIT/ML ~~LOC~~ SOLN
0.0000 [IU] | Freq: Three times a day (TID) | SUBCUTANEOUS | Status: DC
  Administered 2017-05-23: 2 [IU] via SUBCUTANEOUS
  Administered 2017-05-23: 1 [IU] via SUBCUTANEOUS
  Administered 2017-05-24 (×2): 2 [IU] via SUBCUTANEOUS
  Administered 2017-05-24: 3 [IU] via SUBCUTANEOUS
  Administered 2017-05-25 (×2): 1 [IU] via SUBCUTANEOUS
  Administered 2017-05-25 – 2017-05-27 (×5): 2 [IU] via SUBCUTANEOUS
  Administered 2017-05-27 (×2): 1 [IU] via SUBCUTANEOUS
  Administered 2017-05-28 (×2): 2 [IU] via SUBCUTANEOUS
  Administered 2017-05-28 – 2017-05-29 (×2): 1 [IU] via SUBCUTANEOUS
  Administered 2017-05-29: 2 [IU] via SUBCUTANEOUS
  Administered 2017-05-30: 3 [IU] via SUBCUTANEOUS
  Administered 2017-05-30: 2 [IU] via SUBCUTANEOUS
  Administered 2017-05-31 – 2017-06-01 (×4): 1 [IU] via SUBCUTANEOUS
  Administered 2017-06-02: 2 [IU] via SUBCUTANEOUS
  Administered 2017-06-03: 1 [IU] via SUBCUTANEOUS
  Administered 2017-06-04: 3 [IU] via SUBCUTANEOUS
  Administered 2017-06-05: 2 [IU] via SUBCUTANEOUS
  Administered 2017-06-07: 3 [IU] via SUBCUTANEOUS

## 2017-05-23 MED ORDER — PANTOPRAZOLE SODIUM 40 MG PO TBEC
40.0000 mg | DELAYED_RELEASE_TABLET | Freq: Every day | ORAL | Status: DC
  Administered 2017-05-23 – 2017-06-04 (×13): 40 mg via ORAL
  Filled 2017-05-23 (×13): qty 1

## 2017-05-23 NOTE — Progress Notes (Signed)
Speech Language Pathology Daily Session Note  Patient Details  Name: Brendan [name] MRN: [id number] Date of Birth: 08/26/1940  Today's Date: 05/23/2017 SLP Individual Time: 1310-1400 SLP Individual Time Calculation (min): 50 min  Short Term Goals: Week 1: SLP Short Term Goal 1 (Week 1): Pt will sustain attention to basic familiar task  for ~ 15 minutes with Mod A cues.  SLP Short Term Goal 2 (Week 1): Pt will name common objects with ~ 90% accuracy and Min A cues.  SLP Short Term Goal 3 (Week 1): Pt will increase speech intelligibility at the simple phrase level to achieve ~ 75% intelligibility given Mod A cues for use of speech intelligibility strategies.  SLP Short Term Goal 4 (Week 1): Pt will complete basic familiar problem solving tasks related to ADLs with Mod A cues.  SLP Short Term Goal 5 (Week 1): Pt will consume current diet with minimal overt s/s of aspiration and Min A cues for use of compensatory swallow strategies.   Skilled Therapeutic Interventions:   Pt was seen for skilled ST targeting communication and dysphagia goals.  SLP facilitated the session with a trial snack of dys 3 textures and thin liquids to continue working towards diet progression.  Pt demonstrated impaired mastication of advanced textures with moderate residuals in right buccal cavity.  Pt needed mod assist verbal cues for use of liquid wash to clear.  Pt with increased anterior spillage of saliva and thin liquids in comparison to yesterday's therapy session which could be attributed to pt wearing his dentures for the first time in three weeks (per wife's report).  Pt needed mod assist verbal cues for receptive naming of basic objects when given a written choice of three.  Pt was returned to room and left in recliner with wife at bedside.  Continue per current plan of care.    Function:  Eating Eating   Modified Consistency Diet: Yes Eating Assist Level: Helper checks for pocketed food;Supervision or verbal  cues           Cognition Comprehension Comprehension assist level: Understands basic 50 - 74% of the time/ requires cueing 25 - 49% of the time  Expression   Expression assist level: Expresses basic 50 - 74% of the time/requires cueing 25 - 49% of the time. Needs to repeat parts of sentences.  Social Interaction Social Interaction assist level: Interacts appropriately 25 - 49% of time - Needs frequent redirection.  Problem Solving Problem solving assist level: Solves basic 25 - 49% of the time - needs direction more than half the time to initiate, plan or complete simple activities  Memory Memory assist level: Recognizes or recalls 25 - 49% of the time/requires cueing 50 - 75% of the time    Pain Pain Assessment Pain Assessment: No/denies pain  Therapy/Group: Individual Therapy  Janeli Lewison, Melanee SpryNicole L 05/23/2017, 4:15 PM

## 2017-05-23 NOTE — Progress Notes (Signed)
Physical Therapy Session Note  Patient Details  Name: Brendan [name] MRN: [id number] Date of Birth: 11-Jan-1941  Today's Date: 05/23/2017 PT Individual Time: 0945-1030 PT Individual Time Calculation (min): 45 min   Short Term Goals: Week 1:  PT Short Term Goal 1 (Week 1): Pt will increase bed mobility to mod A. PT Short Term Goal 2 (Week 1): Pt will increase transfers to mod A.  PT Short Term Goal 3 (Week 1): Pt will ambulate with LRAD about 25 feet with mod A.  PT Short Term Goal 4 (Week 1): Pt will ascend/descend 4 stairs with B rails and max A.  PT Short Term Goal 5 (Week 1): Pt will propel w/c about 25 feet with min A.   Skilled Therapeutic Interventions/Progress Updates: Pt presented in recliner with wife present agreeable to therapy. Attempted stand pivot with RW to w/c however pt unable to maintain midline despite max multimodal cues for increasing wt to L. Pt  Ultimately performed squat pivot to L with modA and max verbal cues for sequencing. Pt transported to rehab gym for time management. Pt performed squat pivot to mat modA. Performed sit to/from stand at [facility] with mirror feedback for improving midline. Pt able to improve for brief periods with tactile cues. Pt indicated feeling more tired today, no c/o pain. Pt able to take x1 step forward with LLE however unable to maintain LLE engaged to take step with RLE. Pt required modA to return to w/c and transported back to room.  Pt returned to w/c via squat pivot requiring mod verbal cues for sequencing. Pt remained in recliner at end of session with wife present and needs met.      Therapy Documentation Precautions:  Precautions Precautions: Fall Precaution Comments: Lt gaze preference, Rt hemi, aphasic, R inattention Restrictions Weight Bearing Restrictions: No   See Function Navigator for Current Functional Status.   Therapy/Group: Individual Therapy  Tynleigh Birt  Zain Lankford, PTA  05/23/2017, 12:54 PM

## 2017-05-23 NOTE — Progress Notes (Signed)
Subjective/Complaints: Patient seen lying in bed this morning. Wife at bedside. She notes the patient was agitated before when asleep yesterday, but slept much better overnight.  ROS: Unable to assess due to aphasia.  Objective: Vital Signs: Blood pressure (!) 90/45, pulse 60, temperature 97.9 F (36.6 C), temperature source Oral, resp. rate 16, height '5\' 9"'  (1.753 m), weight 92 kg (202 lb 12.8 oz), SpO2 98 %. Dg Chest 2 View  Result Date: 05/21/2017 CLINICAL DATA:  Dizziness EXAM: CHEST  2 VIEW COMPARISON:  05/19/2017 FINDINGS: Postop CABG.  Negative for heart failure.  Negative for pneumonia. Calcified pleural plaque right anterior upper lobe and left lower lobe posterolaterally unchanged from prior CT 07/14/2016. No superimposed acute infiltrate or effusion. IMPRESSION: Chronic pleural plaques bilaterally.  No acute abnormality. Electronically Signed   By: Franchot Gallo M.D.   On: 05/21/2017 12:40   Results for orders placed or performed during the hospital encounter of 05/18/17 (from the past 72 hour(s))  CBC WITH DIFFERENTIAL     Status: Abnormal   Collection Time: 05/21/17  4:31 AM  Result Value Ref Range   WBC 5.7 4.0 - 10.5 K/uL   RBC 3.34 (L) 4.22 - 5.81 MIL/uL   Hemoglobin 11.3 (L) 13.0 - 17.0 g/dL   HCT 32.7 (L) 39.0 - 52.0 %   MCV 97.9 78.0 - 100.0 fL   MCH 33.8 26.0 - 34.0 pg   MCHC 34.6 30.0 - 36.0 g/dL   RDW 13.8 11.5 - 15.5 %   Platelets 199 150 - 400 K/uL   Neutrophils Relative % 64 %   Neutro Abs 3.7 1.7 - 7.7 K/uL   Lymphocytes Relative 28 %   Lymphs Abs 1.6 0.7 - 4.0 K/uL   Monocytes Relative 6 %   Monocytes Absolute 0.3 0.1 - 1.0 K/uL   Eosinophils Relative 2 %   Eosinophils Absolute 0.1 0.0 - 0.7 K/uL   Basophils Relative 0 %   Basophils Absolute 0.0 0.0 - 0.1 K/uL    Comment: Performed at [facility], [address] [place], [place] [address]  Comprehensive metabolic panel     Status: Abnormal   Collection Time: 05/21/17  4:31 AM  Result Value  Ref Range   Sodium 136 135 - 145 mmol/L   Potassium 4.4 3.5 - 5.1 mmol/L   Chloride 101 101 - 111 mmol/L   CO2 23 22 - 32 mmol/L   Glucose, Bld 172 (H) 65 - 99 mg/dL   BUN 15 6 - 20 mg/dL   Creatinine, Ser 1.01 0.61 - 1.24 mg/dL   Calcium 9.1 8.9 - 10.3 mg/dL   Total Protein 6.1 (L) 6.5 - 8.1 g/dL   Albumin 3.1 (L) 3.5 - 5.0 g/dL   AST 21 15 - 41 U/L   ALT 33 17 - 63 U/L   Alkaline Phosphatase 88 38 - 126 U/L   Total Bilirubin 0.8 0.3 - 1.2 mg/dL   GFR calc non Af Amer >60 >60 mL/min   GFR calc Af Amer >60 >60 mL/min    Comment: (NOTE) The eGFR has been calculated using the CKD EPI equation. This calculation has not been validated in all clinical situations. eGFR's persistently <60 mL/min signify possible Chronic Kidney Disease.    Anion gap 12 5 - 15    Comment: Performed at [facility] [address], [place], [place] [address]  Glucose, capillary     Status: Abnormal   Collection Time: 05/21/17 11:35 AM  Result  Value Ref Range   Glucose-Capillary 186 (H) 65 - 99 mg/dL  Urinalysis, Complete w Microscopic     Status: Abnormal   Collection Time: 05/22/17  9:16 AM  Result Value Ref Range   Color, Urine YELLOW YELLOW   APPearance CLEAR CLEAR   Specific Gravity, Urine 1.010 1.005 - 1.030   pH 7.0 5.0 - 8.0   Glucose, UA NEGATIVE NEGATIVE mg/dL   Hgb urine dipstick SMALL (A) NEGATIVE   Bilirubin Urine NEGATIVE NEGATIVE   Ketones, ur NEGATIVE NEGATIVE mg/dL   Protein, ur NEGATIVE NEGATIVE mg/dL   Nitrite NEGATIVE NEGATIVE   Leukocytes, UA NEGATIVE NEGATIVE   RBC / HPF 0-5 0 - 5 RBC/hpf   WBC, UA 0-5 0 - 5 WBC/hpf   Bacteria, UA NONE SEEN NONE SEEN   Squamous Epithelial / LPF NONE SEEN NONE SEEN    Comment: Performed at [facility], [place] [address], [name], [place] [address]    General: NAD. Well-developed.  HEENT: Normocephalic, atraumatic. Cardio: RRR and no JVD. Resp: CTA B/L and Unlabored GI: BS positive and ND Musc/Skel:  No edema, no  tenderness. Neuro: Alert and Oriented x0 Expressive >> Receptive aphasia, ?Improving Motor: LUE/LLE: Grossly 5/5 proximal to distal RUE/RLE: 0/5 proximal to distal (unchanged), tone LUE tremor Dysarthria Skin:   Intact. Warm and dry   Assessment/Plan: 1. Functional deficits secondary to left frontal parietal intraparenchymal hemorrhage with right hemiparesis, aphasia and cognitive deficits which require 3+ hours per day of interdisciplinary therapy in a comprehensive inpatient rehab setting. Physiatrist is providing close team supervision and 24 hour management of active medical problems listed below. Physiatrist and rehab team continue to assess barriers to discharge/monitor patient progress toward functional and medical goals. FIM: Function - Bathing Position: Wheelchair/chair at sink Body parts bathed by patient: Left arm, Chest, Abdomen, Front perineal area, Right upper leg, Left upper leg, Right lower leg, Left lower leg Body parts bathed by helper: Buttocks, Back, Right arm Assist Level: Touching or steadying assistance(Pt > 75%)  Function- Upper Body Dressing/Undressing What is the patient wearing?: Button up shirt Button up shirt - Perfomed by patient: Thread/unthread left sleeve Button up shirt - Perfomed by helper: Thread/unthread right sleeve, Pull shirt around back, Button/unbutton shirt Assist Level: (MaxA) Function - Lower Body Dressing/Undressing What is the patient wearing?: Pants, Non-skid slipper socks, Ted Hose Position: Wheelchair/chair at [employer]- Performed by patient: Thread/unthread left pants leg Pants- Performed by helper: Thread/unthread right pants leg, Pull pants up/down, Thread/unthread left pants leg Non-skid slipper socks- Performed by helper: Don/doff right sock, Don/doff left sock TED Hose - Performed by helper: Don/doff right TED hose, Don/doff left TED hose Assist for footwear: Dependant Assist for lower body dressing: (1 helper)  Function -  Toileting Toileting activity did not occur: No continent bowel/bladder event Toileting steps completed by helper: Adjust clothing prior to toileting, Performs perineal hygiene, Adjust clothing after toileting Assist level: Two helpers(per Thea Alken, NT report)  Function - Air cabin crew transfer activity did not occur: (not attempted) Assist level to toilet: 2 helpers(per Thea Alken, NT report)  Function - Chair/bed transfer Chair/bed transfer assist level: Moderate assist (Pt 50 - 74%/lift or lower) Chair/bed transfer assistive device: Walker, Armrests  Function - Locomotion: Wheelchair Type: Manual Wheelchair activity did not occur: Safety/medical concerns Max wheelchair distance: 100 ft Assist Level: Touching or steadying assistance (Pt > 75%) Wheel 50 feet with 2 turns activity did not occur: Safety/medical concerns Assist Level: Touching or steadying assistance (Pt >  75%) Wheel 150 feet activity did not occur: Safety/medical concerns Function - Locomotion: Ambulation Assistive device: Walker-rolling Max distance: 50 Assist level: Moderate assist (Pt 50 - 74%) Walk 10 feet activity did not occur: Safety/medical concerns Assist level: Moderate assist (Pt 50 - 74%) Walk 50 feet with 2 turns activity did not occur: Safety/medical concerns Assist level: Moderate assist (Pt 50 - 74%) Walk 150 feet activity did not occur: Safety/medical concerns Walk 10 feet on uneven surfaces activity did not occur: Safety/medical concerns  Function - Comprehension Comprehension: Auditory Comprehension assist level: Understands basic 50 - 74% of the time/ requires cueing 25 - 49% of the time  Function - Expression Expression: Verbal Expression assist level: Expresses basic 50 - 74% of the time/requires cueing 25 - 49% of the time. Needs to repeat parts of sentences.  Function - Social Interaction Social Interaction assist level: Interacts appropriately 50 - 74% of the time -  May be physically or verbally inappropriate.  Function - Problem Solving Problem solving assist level: Solves basic 25 - 49% of the time - needs direction more than half the time to initiate, plan or complete simple activities  Function - Memory Memory assist level: Recognizes or recalls 25 - 49% of the time/requires cueing 50 - 75% of the time Patient normally able to recall (first 3 days only): That he or she is in a hospital  Medical Problem List and Plan: 1.Decreased functional mobilitysecondary to left frontal intraparenchymal hemorrhage with recent deep brain stimulator for tremor.    Cont CIR 2. DVT Prophylaxis/Anticoagulation: SCDs. Monitor for any signs of DVT 3. Pain Management:Neurontin 300 mg 3 times a day, Tylenol as needed 4. Mood:Provide emotional support 5. Neuropsych: This patientisnot capable of making decisions onhisown behalf.   limit neuro-sedating meds when possible 6. Skin/Wound Care:Routine skin checks 7. Fluids/Electrolytes/Nutrition:Routine I&O's 8.Hypertension.    Cozaar 100 mg daily, decreased to 50 on 2/5   Lasix 20 mg daily d/ced on 2/5. Vitals:   05/22/17 1500 05/23/17 0428  BP: 135/64 (!) 90/45  Pulse: 65 60  Resp: 18 16  Temp: 97.7 F (36.5 C) 97.9 F (36.6 C)  SpO2: 100% 98%    Remains labile, with hypotension, will consider further decrease of medications tomorrow 9.CAD with stenting/CABG. No chest pain or shortness of breath 10.Hyperlipidemia. Lipitor,Lovaza 11. Tremors:   mysoline and inderal ongoing   simulator not operational yet 12.  Dysphagia   Cont D2 thins    Advance diet as tolerated   CXR reviewed, unremarkable for acute process 13.  Hypoalb- Prostat 14. Prediabetes   Diet change to cut modified, CBGs and SSI started on 2/6 15. ABLA   Hb 11.3 on 2/4   Cont to monitor 16. Confusion  Multifactorial due hypotension and sleep/wake changes and intracranial bleed   CXR reviewed, unremarkable for acute changes   UA  unremarkable, urine culture pending 17. Sleep disturbance  Trazodone 50 qhs scheduled on 2/5   Improving   Educated wife on environment modifications  LOS (Days) 5 A FACE TO FACE EVALUATION WAS PERFORMED  Ankit Lorie Phenix 05/23/2017, 8:53 AM

## 2017-05-23 NOTE — Care Management (Signed)
Inpatient Rehabilitation Center Individual Statement of Services  Patient Name:  Brendan [name]  Date:  05/23/2017  Welcome to the Inpatient Rehabilitation Center.  Our goal is to provide you with an individualized program based on your diagnosis and situation, designed to meet your specific needs.  With this comprehensive rehabilitation program, you will be expected to participate in at least 3 hours of rehabilitation therapies Monday-Friday, with modified therapy programming on the weekends.  Your rehabilitation program will include the following services:  Physical Therapy (PT), Occupational Therapy (OT), Speech Therapy (ST), 24 hour per day rehabilitation nursing, Therapeutic Recreaction (TR), Neuropsychology, Case Management (Social Worker), Rehabilitation Medicine, Nutrition Services and Pharmacy Services  Weekly team conferences will be held on Wednesdays to discuss your progress.  Your Social Worker will talk with you frequently to get your input and to update you on team discussions.  Team conferences with you and your family in attendance may also be held.  Expected length of stay: 3-4 weeks    Overall anticipated outcome: minimal assistance  Depending on your progress and recovery, your program may change. Your Social Worker will coordinate services and will keep you informed of any changes. Your Social Worker's name and contact numbers are listed  below.  The following services may also be recommended but are not provided by the Inpatient Rehabilitation Center:   Driving Evaluations  Home Health Rehabiltiation Services  Outpatient Rehabilitation Services    Arrangements will be made to provide these services after discharge if needed.  Arrangements include referral to agencies that provide these services.  Your insurance has been verified to be:  [employer] Your primary doctor is:  Nelson Chimesmin  Pertinent information will be shared with your doctor and your insurance  company.  Social Worker:  L[place] KerrLucy Salome [place], [place] [phone] or [phone]) [phone]   Information discussed with and copy given to patient by: Amada JupiterHOYLE, Tori Dattilio, 05/23/2017, 2:20 PM

## 2017-05-23 NOTE — Progress Notes (Signed)
Orthopedic Tech Progress Note Patient Details:  Brendan [name] 09/10/1940 [id number]  Patient ID: Brendan [name], male   DOB: 02/27/1941, 77 y.o.   MRN: [id number]   Nikki DomCrawford, [name] 05/23/2017, 2:49 PM Called in hanger brace order; [facility]

## 2017-05-23 NOTE — Progress Notes (Signed)
Occupational Therapy Session Note  Patient Details  Name: Brendan [name] MRN: [id number] Date of Birth: 06/17/1940  Today's Date: 05/23/2017 OT Individual Time: 0800-0900 and 1400-1430 OT Individual Time Calculation (min): 60 min and 30 min    Short Term Goals: Week 1:  OT Short Term Goal 1 (Week 1): Pt will complete 1/4 components of donning shirt OT Short Term Goal 2 (Week 1): Pt will complete toilet transfer with Mod A 1 helper OT Short Term Goal 3 (Week 1): Pt will scan to the Rt for 1 ADL item with mod vcs OT Short Term Goal 4 (Week 1): Pt will initiate washing 3/11 body areas during bathing   Skilled Therapeutic Interventions/Progress Updates:    Session 1: Pt presents sitting up in bed, no c/o and agreeable to OT tx session. Pt completing finishing breakfast, able to name 1 food item correctly without cues, and additional food item with min cues. Focus of session on ADL retraining. Pt completing bed mobility with ModA for RLE and trunk management. Pt requires cues to maintain upright posture seated EOB as Pt with R lateral lean. Pt completes stand pivot transfer with ModA using RW. Pt completing bathing/dressing ADLs from w/c level at sink. Pt initiating washing face and LLE this session, with cues to wash UB, RLE and RUE. Max HOH assist required for washing LUE using RUE and ModA for static standing at sink to wash buttocks with cues to maintain upright standing position. Pt completes LB dressing, threading RLE and LLE into pantlegs with intermittent assist to advance over feet; ModA for sit<>stand and to maintain static standing balance with Pt assisting therapist to advance pants over hips. When asked which UE to don first during UB dressing Pt able to correctly recall need to dress RUE; Pt able to follow step-by-step directions to thread sleeve over RUE using LUE with therapist assist. Pt also assisting therapist with bringing shirt around back and initiating threading LUE into sleeve. Pt  completing stand pivot transfer w/c>recliner with ModA and increased time/cues to advance LEs. Pt left seated in recliner end of session, spouse present, needs within reach.   Session 2: Pt presents sitting up in recliner; Pt with increased fatigue this session but agreeable to tx session and with no c/o pain. Pt's' spouse assisting Pt with doffing/donning new shirt upon entering room, assisted Pt to complete task, Pt requiring 1 step directions to complete. Pt dons L shoe with MinA, maxA to don R shoe. Completed PROM to RUE of forward flexion 0-90*, internal/external rotation to neutral, pronation/supination, digit/wrist flexion/extension. Also providing education to Pt spouse on assisting with PROM when not in therapy. Facilitated having Pt sit upright at EOB (vs using recliner for back support), Pt requiring constant cues to maintain upright sitting position as Pt reverting to leaning back into recliner when fatigued. Pt completed x2 sit<>stands at RW, initially with MinA and then ModA during second trial. Pt repositioned in recliner and left end of session with needs within reach, daughter and spouse present.   Therapy Documentation Precautions:  Precautions Precautions: Fall Precaution Comments: Lt gaze preference, Rt hemi, aphasic, R inattention Restrictions Weight Bearing Restrictions: No    ADL: ADL ADL Comments: Please see functional navigator for ADL status    See Function Navigator for Current Functional Status.   Therapy/Group: Individual Therapy  Orlando PennerBreanna L Sueellen Kayes 05/23/2017, 12:07 PM

## 2017-05-24 ENCOUNTER — Inpatient Hospital Stay (HOSPITAL_COMMUNITY): Payer: PPO | Admitting: Occupational Therapy

## 2017-05-24 ENCOUNTER — Inpatient Hospital Stay (HOSPITAL_COMMUNITY): Payer: PPO | Admitting: Speech Pathology

## 2017-05-24 ENCOUNTER — Inpatient Hospital Stay (HOSPITAL_COMMUNITY): Payer: PPO

## 2017-05-24 DIAGNOSIS — I69851 Hemiplegia and hemiparesis following other cerebrovascular disease affecting right dominant side: Secondary | ICD-10-CM

## 2017-05-24 DIAGNOSIS — G8190 Hemiplegia, unspecified affecting unspecified side: Secondary | ICD-10-CM

## 2017-05-24 DIAGNOSIS — R42 Dizziness and giddiness: Secondary | ICD-10-CM

## 2017-05-24 LAB — GLUCOSE, CAPILLARY
Glucose-Capillary: 183 mg/dL — ABNORMAL HIGH (ref 65–99)
Glucose-Capillary: 206 mg/dL — ABNORMAL HIGH (ref 65–99)
Glucose-Capillary: 161 mg/dL — ABNORMAL HIGH (ref 65–99)
Glucose-Capillary: 155 mg/dL — ABNORMAL HIGH (ref 65–99)

## 2017-05-24 MED ORDER — BACLOFEN 5 MG HALF TABLET
5.0000 mg | ORAL_TABLET | Freq: Three times a day (TID) | ORAL | Status: DC
  Administered 2017-05-24 – 2017-06-04 (×36): 5 mg via ORAL
  Filled 2017-05-24 (×36): qty 1

## 2017-05-24 MED ORDER — QUETIAPINE FUMARATE 25 MG PO TABS
12.5000 mg | ORAL_TABLET | Freq: Every day | ORAL | Status: DC
  Administered 2017-05-24: 12.5 mg via ORAL
  Filled 2017-05-24: qty 1

## 2017-05-24 NOTE — Progress Notes (Signed)
Patient ID: Brendan [name], male   DOB: 11/02/1940, 77 y.o.   MRN: [id number] Social visit...mutual pt... Little change since my last visit. Remains pleasant and cooperative. Limited by aphasia and RUE weakness. Requires max assist with all ADL's and transfers. Daughter present, appropriately attentive. Appreciate CIR's attention and care.

## 2017-05-24 NOTE — Patient Care Conference (Signed)
Inpatient RehabilitationTeam Conference and Plan of Care Update Date: 05/23/2017   Time: 2:50 PM    Patient Name: Brendan [name]      Medical Record Number: [id number]  Date of Birth: 07/02/1940 Sex: Male         Room/Bed: 4M06C/4M06C-01 Payor Info: Payor: [employer] / Plan: [employer] / Product Type: *No Product type* /    Admitting Diagnosis: L Frontal IPH  Admit Date/Time:  05/18/2017  3:49 PM Admission Comments: No comment available   Primary Diagnosis:  <principal problem not specified> Principal Problem: <principal problem not specified>  Patient Active Problem List   Diagnosis Date Noted  . Dizziness   . Hemiplegia (HCC)   . Sleep disturbance   . Confusion, postoperative   . Hypotension due to drugs   . Acute blood loss anemia   . Prediabetes   . Dysphagia   . Benign essential HTN   . Intraparenchymal hemorrhage of brain (HCC) 05/18/2017  . Tremors of nervous system 05/18/2017  . Right sided weakness   . Aphasia   . Intracerebral hemorrhage 05/16/2017  . Cerebral edema (HCC)   . Weakness 05/15/2017  . Coronary artery disease involving native coronary artery of native heart without angina pectoris   . Glaucoma   . Anxiety state   . Cognitive disorder   . Urinary retention   . Traumatic hemorrhage of left cerebrum without loss of consciousness (HCC)   . Pressure injury of skin 05/05/2017  . Seborrheic dermatitis of scalp 10/05/2016  . Tremor, essential 10/03/2016  . Cough 06/27/2016  . Rhinitis, allergic 01/12/2014  . CHRONIC STABLE ANGINA 08/06/2013  . Obesity (BMI 30-39.9) 01/30/2013  . Lower extremity edema 01/30/2013  . S/P CABG x 3   . Atherosclerotic heart disease of native coronary artery with angina pectoris (HCC)   . Hyperlipidemia   . Memory problem 07/01/2012  . RSD (reflex sympathetic dystrophy) 05/24/2012  . Pleural plaque with presence of asbestos 05/15/2012  . Anemia, B12 deficiency 03/19/2012  . Atherosclerotic heart disease  of artery bypass graft 09/16/2011  . BPH (benign prostatic hyperplasia) 08/08/2011  . Microscopic hematuria 02/27/2011  . Anxiety 10/03/2010  . Incontinence 07/21/2010  . ANGINA, STABLE 04/29/2008  . GERD 10/28/2007  . Essential tremor 08/30/2006  . Essential hypertension 07/06/2006    Expected Discharge Date: Expected Discharge Date: 06/09/17  Team Members Present: Physician leading conference: Dr. Maryla MorrowAnkit Patel Social Worker Present: Amada JupiterLucy Esten Dollar, LCSW Nurse Present: Kennon PortelaJeanna Hicks, RN PT Present: Judieth KeensKaren Donaworth, PT OT Present: Roney MansJennifer Smith, OT SLP Present: Jackalyn LombardNicole Page, SLP PPS Coordinator present : Tora DuckMarie Noel, RN, CRRN     Current Status/Progress Goal Weekly Team Focus  Medical   Decreased functional mobility secondary to left frontal intraparenchymal hemorrhage with recent deep brain stimulator for tremor  Improve mobility, safety, confusion, hypotension, prediabetes  See above   Bowel/Bladder   occasional incontinence in brief and PVR will range from 100/s-400's, I&O cath q 4-6 if no void and volumes greater than 350ml  pt will void continent of bowel and bladder normal bowel function with mod A  monitor for void straight cath q4-6 hr assist with toileting q2h and prn pt will be free of constipation   Swallow/Nutrition/ Hydration   dys 2, thin liquids; full supervision   supervision   trials of advanced textures    ADL's   ModA UB bathing, MaxA LB bathing; MaxA UB/LB dressing; ModA overall for functional transfers; Pt requires multimodal cues throughout for initiating/sequencing  MinA  overall   ADL retraining; functional transfers; NMR; cognitive remediation; family ed    Mobility   mod A transfers and gait  min A  balance, cognition, gait   Communication   mod-max assist   min assist   initiation of verbal communication, naming, word finding    Safety/Cognition/ Behavioral Observations  max assist   min assist   basic problem solving, attention, visual scanning to the  right of midline    Pain   pain relieved with Norco 5/325  prn and tylenol prn  pt will have pain that is <=3/10   assess for pain q4h and prn medicate prn and monitor for effectiveness   Skin   abrasion left knee rash upper back purple area on buttocks hypoallergenic sheets  skin will continue to improve and no further skin breakdown  assess skin qshift and prn    Rehab Goals Patient on target to meet rehab goals: Yes *See Care Plan and progress notes for long and short-term goals.     Barriers to Discharge  Current Status/Progress Possible Resolutions Date Resolved   Physician    Medical stability;Decreased caregiver support;Lack of/limited family support;Other (comments)  Cognition     Therapies, adjust sleep/wake, optimize BP, CBG checks      Nursing                  PT                    OT                  SLP                SW                Discharge Planning/Teaching Needs:  Plan at time of CIR admit is for pt to d/c home with wife, however, wife notes she cannot provide much physical assistance.  Daughter may be able to take some FMLA - awaiting word from her employer.  Teaching to be ongoing.   Team Discussion:  Decreased BP - MD adjusting meds;  Pt continues to need caths;  Poor intake and output. D2, thin; aphasic and currently mod-max for cognition.  Mod - max with mobility and ADLs with min-mod goals overall.  SW to follow up further with pt's daughter as concern that wife cannot provide this level of care at home.  Revisions to Treatment Plan:  None    Continued Need for Acute Rehabilitation Level of Care: The patient requires daily medical management by a physician with specialized training in physical medicine and rehabilitation for the following conditions: Daily direction of a multidisciplinary physical rehabilitation program to ensure safe treatment while eliciting the highest outcome that is of practical value to the patient.: Yes Daily medical management  of patient stability for increased activity during participation in an intensive rehabilitation regime.: Yes Daily analysis of laboratory values and/or radiology reports with any subsequent need for medication adjustment of medical intervention for : Neurological problems;Post surgical problems;Diabetes problems;Blood pressure problems;Other  Harshith Pursell 05/24/2017, 10:07 AM

## 2017-05-24 NOTE — Progress Notes (Signed)
Social Work Patient ID: Brendan [name], male   DOB: March 26, 1941, 77 y.o.   MRN: [id number]   Met with pt and daughter, Lovey Newcomer, this morning to review team conference.  Aware of targeted dc date for 2/23 and min-mod assist goals overall.  Daughter very concerned about level of assist and notes this is not a level that her mother can provide.  Daughter does note she has some FMLA time available, however, only ~ 3-4 weeks left on this.  She does ask if SNF is an option if they cannot manage care at home.  Explained that this would be dependent on insurance approval.  Will continue to follow and discuss most appropriate d/c plans.  Nakenya Theall, LCSW

## 2017-05-24 NOTE — Progress Notes (Signed)
Physical Therapy Note  Patient Details  Name: Brendan [name] MRN: [id number] Date of Birth: 02/05/1941 Today's Date: 05/24/2017  0800-0900, 60 min individual tx Pain: none per pt  Pt feeding himself propped up in bed, with sife present.  Pt using L hand successfully with min physical assist intermittently to stop tremors and effectively get food onto spoon. Pt lethargic after eating, nodding off.  Wet washcloth to face effective for waking pt up.    Use of calendar to orient pt.  Pt able to remember Thursday given choice of 2 days of the week, 15 minutes later.  supine active assistive R hip flex/ext/abd/adduction, internal rotation, ankle DF/PF  Pt parpiicated in dressing in bed with wt shifting, rolling using railings.  PT donned abdominal binder, and Brendan [name], CO delivered splint for R hand.  Pt sat EOb x 10 minutes with L hand support, stand by assist.  Pt with trouble initiating sit> stand for transfer, but able to scoot laterally.  Eventually, pt performed squat pivot bed> w/c to L.  Gait training with max assist, RW on level tile, x 8 min, limited by flexed trunk posture, difficulty wt shifting to L in order to advance RLE.   Pt left resting in w/c with quick release belt applied.  Dtr [name]  plans to push pt in w/c now, throughout unit to stimulate him to stay awake.   See function navigator for current status.  Brendan [name] 05/24/2017, 7:49 AM

## 2017-05-24 NOTE — Progress Notes (Signed)
Subjective/Complaints: Pt seen lying in bed this AM.  Daughter at bedside.  Pt slept well, but woke up early and was agitated.    ROS: Unable to assess due to aphasia.  Objective: Vital Signs: Blood pressure (!) 118/54, pulse (!) 57, temperature 98.1 F (36.7 C), temperature source Oral, resp. rate 16, height 5\' 9"  (1.753 m), weight 92 kg (202 lb 12.8 oz), SpO2 99 %. No results found. Results for orders placed or performed during the hospital encounter of 05/18/17 (from the past 72 hour(s))  Glucose, capillary     Status: Abnormal   Collection Time: 05/21/17 11:35 AM  Result Value Ref Range   Glucose-Capillary 186 (H) 65 - 99 mg/dL  Urinalysis, Complete w Microscopic     Status: Abnormal   Collection Time: 05/22/17  9:16 AM  Result Value Ref Range   Color, Urine YELLOW YELLOW   APPearance CLEAR CLEAR   Specific Gravity, Urine 1.010 1.005 - 1.030   pH 7.0 5.0 - 8.0   Glucose, UA NEGATIVE NEGATIVE mg/dL   Hgb urine dipstick SMALL (A) NEGATIVE   Bilirubin Urine NEGATIVE NEGATIVE   Ketones, ur NEGATIVE NEGATIVE mg/dL   Protein, ur NEGATIVE NEGATIVE mg/dL   Nitrite NEGATIVE NEGATIVE   Leukocytes, UA NEGATIVE NEGATIVE   RBC / HPF 0-5 0 - 5 RBC/hpf   WBC, UA 0-5 0 - 5 WBC/hpf   Bacteria, UA NONE SEEN NONE SEEN   Squamous Epithelial / LPF NONE SEEN NONE SEEN    Comment: Performed at [facility], [address] [place], [place] [address]  Urine Culture     Status: None   Collection Time: 05/22/17  2:40 PM  Result Value Ref Range   Specimen Description URINE, RANDOM    Special Requests NONE    Culture      NO GROWTH Performed at [facility], [address] [place], [place] [address]    Report Status 05/23/2017 FINAL   Glucose, capillary     Status: Abnormal   Collection Time: 05/23/17 11:41 AM  Result Value Ref Range   Glucose-Capillary 192 (H) 65 - 99 mg/dL  Glucose, capillary     Status: Abnormal   Collection Time: 05/23/17  4:49 PM  Result Value Ref  Range   Glucose-Capillary 132 (H) 65 - 99 mg/dL  Glucose, capillary     Status: Abnormal   Collection Time: 05/23/17  9:09 PM  Result Value Ref Range   Glucose-Capillary 184 (H) 65 - 99 mg/dL  Glucose, capillary     Status: Abnormal   Collection Time: 05/24/17  6:48 AM  Result Value Ref Range   Glucose-Capillary 183 (H) 65 - 99 mg/dL    General: NAD. Well-developed.  HEENT: Normocephalic, atraumatic. Cardio: RRR and no JVD. Resp: CTA B/L and Unlabored GI: BS positive and ND Musc/Skel:  No edema, no tenderness. Neuro: Alert and Oriented x0, unable to select between choices today Expressive >> Receptive aphasia Motor: LUE/LLE: Grossly 5/5 proximal to distal RUE/RLE: 0/5 proximal to distal (unchanged), increase tone UE and LE LUE tremor Dysarthria Skin:   Intact. Warm and dry   Assessment/Plan: 1. Functional deficits secondary to left frontal parietal intraparenchymal hemorrhage with right hemiparesis, aphasia and cognitive deficits which require 3+ hours per day of interdisciplinary therapy in a comprehensive inpatient rehab setting. Physiatrist is providing close team supervision and 24 hour management of active medical problems listed below. Physiatrist and rehab team continue to assess barriers to discharge/monitor patient progress toward  functional and medical goals. FIM: Function - Bathing Position: Wheelchair/chair at sink Body parts bathed by patient: Left arm, Chest, Abdomen, Front perineal area, Right upper leg, Left upper leg, Right lower leg, Left lower leg Body parts bathed by helper: Buttocks, Back, Right arm Assist Level: Touching or steadying assistance(Pt > 75%)  Function- Upper Body Dressing/Undressing What is the patient wearing?: Button up shirt Button up shirt - Perfomed by patient: Thread/unthread left sleeve Button up shirt - Perfomed by helper: Thread/unthread right sleeve, Pull shirt around back, Button/unbutton shirt Assist Level: (MaxA) Function -  Lower Body Dressing/Undressing What is the patient wearing?: Pants, Non-skid slipper socks, Ted Hose Position: Wheelchair/chair at [employer] Entertain[employer] Pants- Performed by patient: Thread/unthread left pants leg Pants- Performed by helper: Pull pants up/down, Thread/unthread right pants leg Non-skid slipper socks- Performed by helper: Don/doff right sock, Don/doff left sock TED Hose - Performed by helper: Don/doff right TED hose, Don/doff left TED hose Assist for footwear: Dependant Assist for lower body dressing: (1 helper)  Function - Toileting Toileting activity did not occur: No continent bowel/bladder event Toileting steps completed by helper: Adjust clothing prior to toileting, Performs perineal hygiene, Adjust clothing after toileting Assist level: Two helpers(per Maryagnes AmosJaime Bobbitt, NT report)  Function - ArchivistToilet Transfers Toilet transfer activity did not occur: (not attempted) Assist level to toilet: 2 helpers(per Maryagnes AmosJaime Bobbitt, NT report)  Function - Chair/bed transfer Chair/bed transfer assist level: Moderate assist (Pt 50 - 74%/lift or lower) Chair/bed transfer assistive device: Walker, Armrests Chair/bed transfer details: Manual facilitation for placement, Manual facilitation for weight shifting, Verbal cues for safe use of DME/AE, Verbal cues for precautions/safety, Tactile cues for weight shifting  Function - Locomotion: Wheelchair Type: Manual Wheelchair activity did not occur: Safety/medical concerns Max wheelchair distance: 100 ft Assist Level: Touching or steadying assistance (Pt > 75%) Wheel 50 feet with 2 turns activity did not occur: Safety/medical concerns Assist Level: Touching or steadying assistance (Pt > 75%) Wheel 150 feet activity did not occur: Safety/medical concerns Function - Locomotion: Ambulation Assistive device: Walker-rolling Max distance: 50 Assist level: Moderate assist (Pt 50 - 74%) Walk 10 feet activity did not occur: Safety/medical concerns Assist level:  Moderate assist (Pt 50 - 74%) Walk 50 feet with 2 turns activity did not occur: Safety/medical concerns Assist level: Moderate assist (Pt 50 - 74%) Walk 150 feet activity did not occur: Safety/medical concerns Walk 10 feet on uneven surfaces activity did not occur: Safety/medical concerns  Function - Comprehension Comprehension: Auditory Comprehension assist level: Understands basic 50 - 74% of the time/ requires cueing 25 - 49% of the time  Function - Expression Expression: Verbal Expression assist level: Expresses basic 50 - 74% of the time/requires cueing 25 - 49% of the time. Needs to repeat parts of sentences.  Function - Social Interaction Social Interaction assist level: Interacts appropriately 25 - 49% of time - Needs frequent redirection.  Function - Problem Solving Problem solving assist level: Solves basic 25 - 49% of the time - needs direction more than half the time to initiate, plan or complete simple activities  Function - Memory Memory assist level: Recognizes or recalls 25 - 49% of the time/requires cueing 50 - 75% of the time Patient normally able to recall (first 3 days only): That he or she is in a hospital  Medical Problem List and Plan: 1.Decreased functional mobilitysecondary to left frontal intraparenchymal hemorrhage with recent deep brain stimulator for tremor.    Cont CIR   Awaiting WHO/PRAFO 2. DVT Prophylaxis/Anticoagulation: SCDs.  Monitor for any signs of DVT 3. Pain Management:Neurontin 300 mg 3 times a day, Tylenol as needed 4. Mood:Provide emotional support 5. Neuropsych: This patientisnot capable of making decisions onhisown behalf.   limit neuro-sedating meds when possible   Will consider stimulant 6. Skin/Wound Care:Routine skin checks 7. Fluids/Electrolytes/Nutrition:Routine I&O's   Labs ordered for tomorrow 8.Hypertension.    Cozaar 100 mg daily, decreased to 50 on 2/5   Lasix 20 mg daily d/ced on 2/5. Vitals:   05/23/17 2128  05/24/17 0616  BP: (!) 115/50 (!) 118/54  Pulse: 66 (!) 57  Resp:  16  Temp:  98.1 F (36.7 C)  SpO2:  99%    Relatively controlled on 2/7 9.CAD with stenting/CABG. No chest pain or shortness of breath 10.Hyperlipidemia. Lipitor,Lovaza 11. Tremors:   mysoline and inderal ongoing   simulator not operational yet 12.  Dysphagia   Cont D2 thins    Advance diet as tolerated   CXR reviewed, unremarkable for acute process 13.  Hypoalb- Prostat 14. Prediabetes   Diet change to cut modified, CBGs and SSI started on 2/6   Elevated on 2/7, cont to monitor 15. ABLA   Hb 11.3 on 2/4   Cont to monitor 16. Confusion  Multifactorial due hypotension and sleep/wake changes and intracranial bleed/aphasia   CXR reviewed, unremarkable for acute changes   UA unremarkable, urine culture NG 17. Sleep disturbance  Trazodone 50 qhs scheduled on 2/5, changed to Seroquel 12.5 on 2/7   Improving   Educated wife on environment modifications 18. Spastic hemiplegia  Baclofen 5 TID started on 2/7  LOS (Days) 6 A FACE TO FACE EVALUATION WAS PERFORMED  Kerina Simoneau Karis Juba 05/24/2017, 8:50 AM

## 2017-05-24 NOTE — Progress Notes (Signed)
Occupational Therapy Session Note  Patient Details  Name: Brendan [name] MRN: [id number] Date of Birth: 05/01/1940  Today's Date: 05/24/2017 OT Individual Time: 1132-1202 OT Individual Time Calculation (min): 30 min    Short Term Goals: Week 1:  OT Short Term Goal 1 (Week 1): Pt will complete 1/4 components of donning shirt OT Short Term Goal 2 (Week 1): Pt will complete toilet transfer with Mod A 1 helper OT Short Term Goal 3 (Week 1): Pt will scan to the Rt for 1 ADL item with mod vcs OT Short Term Goal 4 (Week 1): Pt will initiate washing 3/11 body areas during bathing   Skilled Therapeutic Interventions/Progress Updates:    Pt in wheelchair to start session with daughter [name] present as well.  Therapist removed wrist cock-up splint from the RUE, and had pt attempt AROM to the right shoulder and hand.  Slight trace shoulder flexion and extension noted but no active movement in the left hand. Progressed to working on sit to stand and standing balance with bedside table in front supporting BUES.  Noted increased adduction tone in the right shoulder with initial sit to stand.  Increased cervical and trunk flexion present in standing with motor impersistence noted.  He was able to complete sit to stand with mod assist, but then falls into flexion through the trunk and neck secondary to not being able to sustain motor activation.  Increased lean to the right as well once standing.  Attempted to work on simple weightshifts to the left in standing but pt resistant to therapist input to shift to the left.  Mod facilitation also needed to sustain left knee extension had him lift the LLE and position it slightly to help increase right weight shift, again with mod facilitation.  Pt's daughter stood in front of table to help give visual target to help influence head posture, but still mod assist was needed.  He completed 3 sets of standing for 2-3 mins each to work on midline posture and balance.  Finished  session with pt in wheelchair.  Therapist supplied half lap tray for support and positioning of the RUE.    Therapy Documentation Precautions:  Precautions Precautions: Fall Precaution Comments: Lt gaze preference, Rt hemi, aphasic, R inattention Restrictions Weight Bearing Restrictions: No  Pain: Pain Assessment Pain Assessment: No/denies pain ADL: See Function Navigator for Current Functional Status.   Therapy/Group: Individual Therapy  Dierra Riesgo OTR/L 05/24/2017, 12:37 PM

## 2017-05-24 NOTE — Progress Notes (Signed)
Speech Language Pathology Daily Session Note  Patient Details  Name: [name] W Phillips MRN: [id number] Date of Birth: 10/18/1940  Today's Date: 05/24/2017 SLP Individual Time: 1000-1100 SLP Individual Time Calculation (min): 60 min  Short Term Goals: Week 1: SLP Short Term Goal 1 (Week 1): Pt will sustain attention to basic familiar task  for ~ 15 minutes with Mod A cues.  SLP Short Term Goal 2 (Week 1): Pt will name common objects with ~ 90% accuracy and Min A cues.  SLP Short Term Goal 3 (Week 1): Pt will increase speech intelligibility at the simple phrase level to achieve ~ 75% intelligibility given Mod A cues for use of speech intelligibility strategies.  SLP Short Term Goal 4 (Week 1): Pt will complete basic familiar problem solving tasks related to ADLs with Mod A cues.  SLP Short Term Goal 5 (Week 1): Pt will consume current diet with minimal overt s/s of aspiration and Min A cues for use of compensatory swallow strategies.   Skilled Therapeutic Interventions:  Pt was seen for skilled ST targeting goals for communication and dysphagia.  SLP facilitated the session with a functional snack of dys 2 textures and thin liquids.  Pt was not wearing dentures today and was noted to have improved containment of saliva, but did have some spillage with thin liquids.  Pt had efficient mastication of dys 2 solids and no oral residue post swallow with supervision cues for use of swallowing precautions.  No overt s/s of aspiration were evident with solids or liquids and pt's daughter reports good toleration of breakfast this morning.  Pt was able to complete phrase closure tasks with min-mod verbal cues to recognize and correct verbal errors.  He was also able to generate the names of objects which were opposite of a targeted word with min assist-supervision for 100% accuracy.  Pt's spontaneous verbalizations for social exchanges were noted to be improved with supervision to provide specific answers to  therapist's questions rather than saying "I don't know."  Pt's daughter was present and therapist provided education regarding techniques to maximize pt's functional communication.  Pt was returned to room at which point he requested to use the bathroom.  RN and nurse tech made aware.  Pt handed off to nursing staff for toileting.  Continue per current plan of care.    Function:  Eating Eating   Modified Consistency Diet: Yes Eating Assist Level: Helper checks for pocketed food;Supervision or verbal cues           Cognition Comprehension Comprehension assist level: Understands basic 75 - 89% of the time/ requires cueing 10 - 24% of the time  Expression   Expression assist level: Expresses basic 75 - 89% of the time/requires cueing 10 - 24% of the time. Needs helper to occlude trach/needs to repeat words.  Social Interaction Social Interaction assist level: Interacts appropriately 50 - 74% of the time - May be physically or verbally inappropriate.  Problem Solving Problem solving assist level: Solves basic 50 - 74% of the time/requires cueing 25 - 49% of the time  Memory Memory assist level: Recognizes or recalls 25 - 49% of the time/requires cueing 50 - 75% of the time    Pain Pain Assessment Pain Assessment: No/denies pain  Therapy/Group: Individual Therapy  Amare Bail, Melanee SpryNicole L 05/24/2017, 12:55 PM

## 2017-05-24 NOTE — Progress Notes (Signed)
Occupational Therapy Session Note  Patient Details  Name: Brendan [name] MRN: [id number] Date of Birth: 01/04/1941  Today's Date: 05/24/2017 OT Individual Time: 1451-1536 OT Individual Time Calculation (min): 45 min    Short Term Goals: Week 1:  OT Short Term Goal 1 (Week 1): Pt will complete 1/4 components of donning shirt OT Short Term Goal 2 (Week 1): Pt will complete toilet transfer with Mod A 1 helper OT Short Term Goal 3 (Week 1): Pt will scan to the Rt for 1 ADL item with mod vcs OT Short Term Goal 4 (Week 1): Pt will initiate washing 3/11 body areas during bathing   Skilled Therapeutic Interventions/Progress Updates:    Pt presents supine in bed, sleepy but agreeable to OT tx session. Pt completes bed mobility with ModA and verbal cues for technique; completes squat pivot transfer to the R with MaxA with Pt daughter guiding hips for safety during transfer. Pt transported to gym total assist; Pt placed in front of mirror for visual feedback, focus on increasing alignment to midline. Pt requires minA and verbal cues to maintain as he continues to demonstrate lateral leans. Pt completed seated reaching activity matching cards; Pt able to correctly match 4/6 cards with max multimodal cues, increased time to complete. Engaged in PROM to RUE to Pt's tolerance during seated rest break. Pt completed x1 sit<>stand at RW, requiring ModA for sit<>stand, increasing to MaxA to maintain static standing as Pt with increased fatigue. Pt returned to room in manner described above, MaxA (+2 for safety) squat pivot w/c>EOB to the R. MaxA to return to supine in bed. Pt left supine in bed, bed alarm set, call bell and needs within reach.   Precautions:  Precautions Precautions: Fall Precaution Comments: Lt gaze preference, Rt hemi, aphasic, R inattention Restrictions Weight Bearing Restrictions: No  Pain: Pain Assessment Pain Assessment: No/denies pain ADL: ADL ADL Comments: Please see functional  navigator for ADL status  See Function Navigator for Current Functional Status.   Therapy/Group: Individual Therapy  Orlando PennerBreanna L Goble Fudala 05/24/2017, 4:25 PM

## 2017-05-25 ENCOUNTER — Inpatient Hospital Stay (HOSPITAL_COMMUNITY): Payer: PPO | Admitting: Physical Therapy

## 2017-05-25 ENCOUNTER — Inpatient Hospital Stay (HOSPITAL_COMMUNITY): Payer: PPO

## 2017-05-25 ENCOUNTER — Inpatient Hospital Stay (HOSPITAL_COMMUNITY): Payer: PPO | Admitting: Occupational Therapy

## 2017-05-25 ENCOUNTER — Ambulatory Visit (HOSPITAL_COMMUNITY): Payer: PPO | Admitting: Speech Pathology

## 2017-05-25 DIAGNOSIS — N179 Acute kidney failure, unspecified: Secondary | ICD-10-CM

## 2017-05-25 DIAGNOSIS — G8111 Spastic hemiplegia affecting right dominant side: Secondary | ICD-10-CM

## 2017-05-25 LAB — BASIC METABOLIC PANEL
Anion gap: 10 (ref 5–15)
BUN: 26 mg/dL — ABNORMAL HIGH (ref 6–20)
CO2: 25 mmol/L (ref 22–32)
Calcium: 8.8 mg/dL — ABNORMAL LOW (ref 8.9–10.3)
Chloride: 100 mmol/L — ABNORMAL LOW (ref 101–111)
Creatinine, Ser: 1.17 mg/dL (ref 0.61–1.24)
GFR calc Af Amer: >60 mL/min (ref >60)
GFR calc non Af Amer: 60 mL/min — ABNORMAL LOW (ref >60)
Glucose, Bld: 151 mg/dL — ABNORMAL HIGH (ref 65–99)
Potassium: 4.9 mmol/L (ref 3.5–5.1)
Sodium: 135 mmol/L (ref 135–145)

## 2017-05-25 LAB — GLUCOSE, CAPILLARY
Glucose-Capillary: 156 mg/dL — ABNORMAL HIGH (ref 65–99)
Glucose-Capillary: 140 mg/dL — ABNORMAL HIGH (ref 65–99)
Glucose-Capillary: 133 mg/dL — ABNORMAL HIGH (ref 65–99)

## 2017-05-25 MED ORDER — QUETIAPINE FUMARATE 25 MG PO TABS
12.5000 mg | ORAL_TABLET | Freq: Every day | ORAL | Status: DC
  Administered 2017-05-25 – 2017-05-27 (×3): 12.5 mg via ORAL
  Filled 2017-05-25 (×3): qty 1

## 2017-05-25 NOTE — Progress Notes (Signed)
Physical Therapy Session Note  Patient Details  Name: Brendan [name] W Siebers MRN: [id number] Date of Birth: 06/07/1940  Today's Date: 05/25/2017 PT Individual Time: 0831-0900 PT Individual Time Calculation (min): 29 min   Short Term Goals: Week 1:  PT Short Term Goal 1 (Week 1): Pt will increase bed mobility to mod A. PT Short Term Goal 2 (Week 1): Pt will increase transfers to mod A.  PT Short Term Goal 3 (Week 1): Pt will ambulate with LRAD about 25 feet with mod A.  PT Short Term Goal 4 (Week 1): Pt will ascend/descend 4 stairs with B rails and max A.  PT Short Term Goal 5 (Week 1): Pt will propel w/c about 25 feet with min A.   Skilled Therapeutic Interventions/Progress Updates:    Pt seated in recliner upon PT arrival, agreeable to therapy tx and reports soreness all over with movements. Pt very lethargic this session and having difficulty staying awake. Pt seated in recliner to engage in feeding task using L UE. Pt using L hand successfully with min physical assist intermittently to stop tremors and effectively get food onto spoon. Pt requiring cues and stimulation throughout in order to attend to task. Pt left reclined in recliner with wife present at end of session.    Therapy Documentation Precautions:  Precautions Precautions: Fall Precaution Comments: Lt gaze preference, Rt hemi, aphasic, R inattention Restrictions Weight Bearing Restrictions: No   See Function Navigator for Current Functional Status.   Therapy/Group: Individual Therapy  Cresenciano GenreEmily van Schagen, PT, DPT 05/25/2017, 7:49 AM

## 2017-05-25 NOTE — Progress Notes (Signed)
Subjective/Complaints: Pt seen lying in bed this AM.  Wife at bedside.  Pt slept very well overnight, but remains sleepy this AM.  Wife notes no agitation yesterday.  However, pt received his Seroquel at 10:30PM. He has is braces in place now.   ROS: Unable to assess due to aphasia.  Objective: Vital Signs: Blood pressure (!) 109/50, pulse (!) 58, temperature 97.7 F (36.5 C), temperature source Oral, resp. rate 16, height '5\' 9"'$  (1.753 m), weight 92 kg (202 lb 12.8 oz), SpO2 100 %. No results found. Results for orders placed or performed during the hospital encounter of 05/18/17 (from the past 72 hour(s))  Urine Culture     Status: None   Collection Time: 05/22/17  2:40 PM  Result Value Ref Range   Specimen Description URINE, RANDOM    Special Requests NONE    Culture      NO GROWTH Performed at [facility], [address], [place], [place] [address]    Report Status 05/23/2017 FINAL   Glucose, capillary     Status: Abnormal   Collection Time: 05/23/17 11:41 AM  Result Value Ref Range   Glucose-Capillary 192 (H) 65 - 99 mg/dL  Glucose, capillary     Status: Abnormal   Collection Time: 05/23/17  4:49 PM  Result Value Ref Range   Glucose-Capillary 132 (H) 65 - 99 mg/dL  Glucose, capillary     Status: Abnormal   Collection Time: 05/23/17  9:09 PM  Result Value Ref Range   Glucose-Capillary 184 (H) 65 - 99 mg/dL  Glucose, capillary     Status: Abnormal   Collection Time: 05/24/17  6:48 AM  Result Value Ref Range   Glucose-Capillary 183 (H) 65 - 99 mg/dL  Glucose, capillary     Status: Abnormal   Collection Time: 05/24/17 11:34 AM  Result Value Ref Range   Glucose-Capillary 206 (H) 65 - 99 mg/dL  Glucose, capillary     Status: Abnormal   Collection Time: 05/24/17  4:43 PM  Result Value Ref Range   Glucose-Capillary 161 (H) 65 - 99 mg/dL  Glucose, capillary     Status: Abnormal   Collection Time: 05/24/17  8:48 PM  Result Value Ref Range   Glucose-Capillary 155  (H) 65 - 99 mg/dL  Basic metabolic panel     Status: Abnormal   Collection Time: 05/25/17  5:06 AM  Result Value Ref Range   Sodium 135 135 - 145 mmol/L   Potassium 4.9 3.5 - 5.1 mmol/L   Chloride 100 (L) 101 - 111 mmol/L   CO2 25 22 - 32 mmol/L   Glucose, Bld 151 (H) 65 - 99 mg/dL   BUN 26 (H) 6 - 20 mg/dL   Creatinine, Ser 1.17 0.61 - 1.24 mg/dL   Calcium 8.8 (L) 8.9 - 10.3 mg/dL   GFR calc non Af Amer 60 (L) >60 mL/min   GFR calc Af Amer >60 >60 mL/min    Comment: (NOTE) The eGFR has been calculated using the CKD EPI equation. This calculation has not been validated in all clinical situations. eGFR's persistently <60 mL/min signify possible Chronic Kidney Disease.    Anion gap 10 5 - 15    Comment: Performed at [facility] [address] [place], [place] [address]  Glucose, capillary     Status: Abnormal   Collection Time: 05/25/17  6:34 AM  Result Value Ref Range   Glucose-Capillary 156 (H) 65 - 99 mg/dL  General: NAD. Well-developed.  HEENT: Normocephalic, atraumatic. Cardio: RRR and no JVD. Resp: CTA B/L and Unlabored GI: BS positive and ND Musc/Skel:  No edema, no tenderness. Neuro: Alert and Oriented x0, unable to select between choices today Expressive >> Receptive aphasia Motor: LUE/LLE: Grossly 5/5 proximal to distal RUE/RLE: 0/5 proximal to distal (unchanged), increase tone UE and LE LUE tremor Dysarthria Skin:   Intact. Warm and dry   Assessment/Plan: 1. Functional deficits secondary to left frontal parietal intraparenchymal hemorrhage with right hemiparesis, aphasia and cognitive deficits which require 3+ hours per day of interdisciplinary therapy in a comprehensive inpatient rehab setting. Physiatrist is providing close team supervision and 24 hour management of active medical problems listed below. Physiatrist and rehab team continue to assess barriers to discharge/monitor patient progress toward functional and medical goals. FIM: Function -  Bathing Position: Wheelchair/chair at sink Body parts bathed by patient: Left arm, Chest, Abdomen, Front perineal area, Right upper leg, Left upper leg, Right lower leg, Left lower leg Body parts bathed by helper: Buttocks, Back, Right arm Assist Level: Touching or steadying assistance(Pt > 75%)  Function- Upper Body Dressing/Undressing What is the patient wearing?: Button up shirt Button up shirt - Perfomed by patient: Thread/unthread left sleeve Button up shirt - Perfomed by helper: Thread/unthread right sleeve, Pull shirt around back, Button/unbutton shirt Assist Level: (MaxA) Function - Lower Body Dressing/Undressing What is the patient wearing?: Pants, Non-skid slipper socks, Ted Hose Position: Wheelchair/chair at [employer]- Performed by patient: Thread/unthread left pants leg Pants- Performed by helper: Pull pants up/down, Thread/unthread right pants leg Non-skid slipper socks- Performed by helper: Don/doff right sock, Don/doff left sock TED Hose - Performed by helper: Don/doff right TED hose, Don/doff left TED hose Assist for footwear: Dependant Assist for lower body dressing: (1 helper)  Function - Toileting Toileting activity did not occur: No continent bowel/bladder event Toileting steps completed by helper: Adjust clothing prior to toileting, Performs perineal hygiene, Adjust clothing after toileting Assist level: Two helpers(per Thea Alken, NT report)  Function - Air cabin crew transfer activity did not occur: (not attempted) Assist level to toilet: 2 helpers(per Thea Alken, NT report)  Function - Chair/bed transfer Chair/bed transfer method: Squat pivot Chair/bed transfer assist level: Moderate assist (Pt 50 - 74%/lift or lower) Chair/bed transfer assistive device: Walker, Armrests Chair/bed transfer details: Manual facilitation for placement, Manual facilitation for weight shifting, Verbal cues for safe use of DME/AE, Verbal cues for  precautions/safety  Function - Locomotion: Wheelchair Type: Manual Wheelchair activity did not occur: Safety/medical concerns Max wheelchair distance: 100 ft Assist Level: Touching or steadying assistance (Pt > 75%) Wheel 50 feet with 2 turns activity did not occur: Safety/medical concerns Assist Level: Touching or steadying assistance (Pt > 75%) Wheel 150 feet activity did not occur: Safety/medical concerns Function - Locomotion: Ambulation Assistive device: Walker-rolling Max distance: 8 Assist level: Maximal assist (Pt 25 - 49%) Walk 10 feet activity did not occur: Safety/medical concerns Assist level: Moderate assist (Pt 50 - 74%) Walk 50 feet with 2 turns activity did not occur: Safety/medical concerns Assist level: Moderate assist (Pt 50 - 74%) Walk 150 feet activity did not occur: Safety/medical concerns Walk 10 feet on uneven surfaces activity did not occur: Safety/medical concerns  Function - Comprehension Comprehension: Auditory Comprehension assist level: Understands basic 75 - 89% of the time/ requires cueing 10 - 24% of the time  Function - Expression Expression: Verbal Expression assist level: Expresses basic 75 - 89% of the time/requires cueing 10 - 24%  of the time. Needs helper to occlude trach/needs to repeat words.  Function - Social Interaction Social Interaction assist level: Interacts appropriately 50 - 74% of the time - May be physically or verbally inappropriate.  Function - Problem Solving Problem solving assist level: Solves basic 50 - 74% of the time/requires cueing 25 - 49% of the time  Function - Memory Memory assist level: Recognizes or recalls 25 - 49% of the time/requires cueing 50 - 75% of the time Patient normally able to recall (first 3 days only): That he or she is in a hospital  Medical Problem List and Plan: 1.Decreased functional mobilitysecondary to left frontal intraparenchymal hemorrhage with recent deep brain stimulator for tremor.     Cont CIR   WHO/PRAFO in place 2. DVT Prophylaxis/Anticoagulation: SCDs. Monitor for any signs of DVT 3. Pain Management:Neurontin 300 mg 3 times a day, Tylenol as needed 4. Mood:Provide emotional support 5. Neuropsych: This patientisnot capable of making decisions onhisown behalf.   limit neuro-sedating meds when possible   Will consider stimulant 6. Skin/Wound Care:Routine skin checks 7. Fluids/Electrolytes/Nutrition:Routine I&O's 8.Hypertension.    Cozaar 100 mg daily, decreased to 50 on 2/5   Lasix 20 mg daily d/ced on 2/5. Vitals:   05/24/17 2058 05/25/17 0835  BP: 103/65 (!) 109/50  Pulse: 63 (!) 58  Resp:    Temp:    SpO2:      Relatively controlled on 2/8 9.CAD with stenting/CABG. No chest pain or shortness of breath 10.Hyperlipidemia. Lipitor,Lovaza 11. Tremors:   mysoline and inderal ongoing   simulator not operational yet 12.  Dysphagia   Cont D2 thins    Advance diet as tolerated   CXR reviewed, unremarkable for acute process 13.  Hypoalb- Prostat 14. Prediabetes   Diet change to cut modified, CBGs and SSI started on 2/6   Relatively controlled on 2/8 15. ABLA   Hb 11.3 on 2/4   Cont to monitor 16. Confusion  Multifactorial due hypotension and sleep/wake changes and intracranial bleed/aphasia   CXR reviewed, unremarkable for acute changes   UA unremarkable, urine culture NG 17. Sleep disturbance  Trazodone 50 qhs scheduled on 2/5, changed to Seroquel 12.5 on 2/7   Improving   Educated wife on environment modifications 18. Spastic hemiplegia  Baclofen 5 TID started on 2/7  Cont ROM  Cont bracing 19. AKI  Cr 1.17 on 2/8  Encourage fluids  Will consider IVF if necessary  LOS (Days) 7 A FACE TO FACE EVALUATION WAS PERFORMED  Natalee Tomkiewicz Lorie Phenix 05/25/2017, 9:55 AM

## 2017-05-25 NOTE — Progress Notes (Signed)
Physical Therapy Session Note  Patient Details  Name: Brendan [name] MRN: [id number] Date of Birth: 08/11/1940  Today's Date: 05/25/2017 PT Individual Time: 1500-1515 PT Individual Time Calculation (min): 15 min   Short Term Goals: Week 1:  PT Short Term Goal 1 (Week 1): Pt will increase bed mobility to mod A. PT Short Term Goal 2 (Week 1): Pt will increase transfers to mod A.  PT Short Term Goal 3 (Week 1): Pt will ambulate with LRAD about 25 feet with mod A.  PT Short Term Goal 4 (Week 1): Pt will ascend/descend 4 stairs with B rails and max A.  PT Short Term Goal 5 (Week 1): Pt will propel w/c about 25 feet with min A.   Skilled Therapeutic Interventions/Progress Updates:    Pt asleep in recliner upon therapist arrival. Pt is arousable but remains lethargic throughout therapy session. Pt reports no pain. Squat pivot transfer recliner to bed dependent due to pt fatigue level and inability to follow verbal or tactile cues. Sit to supine assist x 2 due to pt fatigue level. Pt left supine in bed with needs in reach, bed alarm in place. Pt missed 15 min of scheduled skilled therapy time due to fatigue level and inability to stay awake to functionally participate.  Therapy Documentation Precautions:  Precautions Precautions: Fall Precaution Comments: Lt gaze preference, Rt hemi, aphasic, R inattention Restrictions Weight Bearing Restrictions: No General: PT Amount of Missed Time (min): 15 Minutes PT Missed Treatment Reason: Patient fatigue  See Function Navigator for Current Functional Status.   Therapy/Group: Individual Therapy  Peter Congoaylor Rosell Khouri, PT, DPT  05/25/2017, 4:32 PM

## 2017-05-25 NOTE — Progress Notes (Signed)
   Subjective:    Patient ID: Brendan [name], male    DOB: 11/25/1940, 77 y.o.   MRN: [id number]  HPI:  Pt seen in the hospital today.  Wife present. Extensive records reviewed.  Pt states "Nice to see you.  Glad you came."  Wife reports worked hard in therapy yesterday.   Got meds late last night so more sleepy today than has been.  Wife reports patient self feeding with L hand.      Review of Systems:  Unable due to limited attention (falls asleep)     Objective:   Physical Exam  Vitals:   05/24/17 2058 05/25/17 0835  BP: 103/65 (!) 109/50  Pulse: 63 (!) 58  Resp:    Temp:    SpO2:      Gen:  Appears stated age and in NAD.  Laying in [place] chair when seen.  Somnolent but actually is able to joke with wife and laughs on one occasion. HEENT:  Normocephalic, atraumatic. The mucous membranes are moist.   NEUROLOGICAL:  Orientation:  The patient is alert and oriented x 1.  Knows wife Cranial nerves: There is good facial symmetry. Extraocular muscles are intact and visual fields are full to confrontational testing. Speech is fluent, not dysarthric but some dysphasia.  Tone: Tone is good throughout.  Did not detect spasticity yet in RUE/RLE but patient complains of some pain when R leg passively moved.  Sensation: Sensation is intact to light touch x 4 Motor: Grip strength good on the L.  Holds L arm antigravity.  Wiggles toes on the L for me but won't raise the leg (falling asleep when attempting this)  Labs:  Lab Results  Component Value Date   WBC 5.7 05/21/2017   HGB 11.3 (L) 05/21/2017   HCT 32.7 (L) 05/21/2017   MCV 97.9 05/21/2017   PLT 199 05/21/2017      Assessment & Plan:   1.  S/p intraparenchmal hemmorhage, post op DBS  -long talk with patients wife.  Understandably, she is tired but hopeful to get pt home in future.  Told her that the paralysis on the right may be permanent.  Explained that while this is not desirable outcome, this is unfortunately a risk of sx we  discussed pre-op.  Offered support to wife (and daughter, on phone).  For now, aggressive therapy is best way to continue.  Will leave on my schedule for now but told wife she could cx if not out of hospital or not able to come at that time.  Encouraged patient to communicate with voice and continue to work hard with therapy.

## 2017-05-25 NOTE — Progress Notes (Signed)
Occupational Therapy Session Note  Patient Details  Name: Brendan [name] MRN: [id number] Date of Birth: 05/30/1940  Today's Date: 05/25/2017 OT Individual Time: 0930-1015 OT Individual Time Calculation (min): 45 min    Short Term Goals: Week 1:  OT Short Term Goal 1 (Week 1): Pt will complete 1/4 components of donning shirt OT Short Term Goal 2 (Week 1): Pt will complete toilet transfer with Mod A 1 helper OT Short Term Goal 3 (Week 1): Pt will scan to the Rt for 1 ADL item with mod vcs OT Short Term Goal 4 (Week 1): Pt will initiate washing 3/11 body areas during bathing   Skilled Therapeutic Interventions/Progress Updates:    1:1 Pt very sleepy this am and difficulty remaining awake - even in both session before this one. Pt in recliner. Participated in dressing only. Pt required total A with focus on initiation of forward flexion and use of left hand to assist with clothing management (UB/LB). Pt required max multimodal cues to attend and follow through with all tasks. Pt performed sit to stand with Steady with max A +2 with significant lean to the right requiring max A to maintain midline. Participated in oral care and shaving. Oral sitting in the steady with focus on head/ neck and trunk control at midline with visual feedback in the mirror but still required max to total A while brushing teeth.  Sat back down in w/c and transitioned to the gym.  Engaged in modified game of Yat to focus on sustained attention. Left up in the w/c with lap tray and safety belt.  Therapy Documentation Precautions:  Precautions Precautions: Fall Precaution Comments: Lt gaze preference, Rt hemi, aphasic, R inattention Restrictions Weight Bearing Restrictions: No General:   Vital Signs: Therapy Vitals Pulse Rate: (!) 58 BP: (!) 109/50 Patient Position (if appropriate): Sitting Pain: Pain Assessment Faces Pain Scale: Hurts little more Pain Type: Acute pain Pain Location: Generalized Pain  Intervention(s): Medication (See eMAR) ADL: ADL ADL Comments: Please see functional navigator for ADL status  See Function Navigator for Current Functional Status.   Therapy/Group: Individual Therapy  Roney MansSmith, Meloni Hinz [facility] 05/25/2017, 10:45 AM

## 2017-05-25 NOTE — Progress Notes (Signed)
Occupational Therapy Session Note  Patient Details  Name: Brendan [name] W Mustard MRN: [id number] Date of Birth: 03/20/1941  Today's Date: 05/25/2017 OT Individual Time: 1304-1400 OT Individual Time Calculation (min): 56 min    Short Term Goals: Week 1:  OT Short Term Goal 1 (Week 1): Pt will complete 1/4 components of donning shirt OT Short Term Goal 2 (Week 1): Pt will complete toilet transfer with Mod A 1 helper OT Short Term Goal 3 (Week 1): Pt will scan to the Rt for 1 ADL item with mod vcs OT Short Term Goal 4 (Week 1): Pt will initiate washing 3/11 body areas during bathing   Skilled Therapeutic Interventions/Progress Updates:    Treatment session with focus on trunk control and motor planning.  Pt received upright in recliner finishing lunch with wife assisting.  Pt unable to recall any items he had eaten for lunch, despite providing pt with selection of 2 items.  Noted pt with lean to Rt, requiring max multimodal cues to correct.  Engaged in reaching task in moderately distracting environment to increase arousal, as pt with difficulty remaining awake throughout all sessions this date.  Engaged in reaching task to Lt to promote weight shift through trunk to decrease Rt lean.  Utilized familiar letter magnets to spell pt's name, pt requiring mod multimodal cues for motor planning and sequencing.  Pt able to initiate weight shift with increased time and tactile cues along Rt trunk.  Attempted simple pattern replication activity to incorporate into reaching with pt unable to replicate despite max multimodal cues and template infront of pt.  Provided wedge along Rt leg/hip to improve sitting posture with noted carryover from reaching and wedge, encouraged pt to remain upright as tolerated.  Therapy Documentation Precautions:  Precautions Precautions: Fall Precaution Comments: Lt gaze preference, Rt hemi, aphasic, R inattention Restrictions Weight Bearing Restrictions: No General:   Vital  Signs: Therapy Vitals Temp: 97.8 F (36.6 C) Temp Source: Oral Pulse Rate: (!) 56 Resp: 16 BP: (!) 99/53 Patient Position (if appropriate): Lying Oxygen Therapy SpO2: 98 % O2 Device: Not Delivered Pain: Pain Assessment Pain Assessment: Faces Faces Pain Scale: Hurts little more Pain Type: Acute pain Pain Location: Generalized Pain Descriptors / Indicators: Aching Pain Intervention(s): RN made aware;Repositioned ADL: ADL ADL Comments: Please see functional navigator for ADL status  See Function Navigator for Current Functional Status.   Therapy/Group: Individual Therapy  Rosalio LoudHOXIE, Myrakle Wingler 05/25/2017, 3:56 PM

## 2017-05-25 NOTE — Progress Notes (Signed)
Speech Language Pathology Weekly Progress and Session Note  Patient Details  Name: Brendan [name] MRN: [id number] Date of Birth: 28-Jun-1940  Beginning of progress report period:  May 18, 2017   End of progress report period: May 25, 2017   Today's Date: 05/25/2017 SLP Individual Time: 0800-0830 SLP Individual Time Calculation (min): 30 min  Short Term Goals: Week 1: SLP Short Term Goal 1 (Week 1): Pt will sustain attention to basic familiar task  for ~ 15 minutes with Mod A cues.  SLP Short Term Goal 1 - Progress (Week 1): Progressing toward goal SLP Short Term Goal 2 (Week 1): Pt will name common objects with ~ 90% accuracy and Min A cues.  SLP Short Term Goal 2 - Progress (Week 1): Met SLP Short Term Goal 3 (Week 1): Pt will increase speech intelligibility at the simple phrase level to achieve ~ 75% intelligibility given Mod A cues for use of speech intelligibility strategies.  SLP Short Term Goal 3 - Progress (Week 1): Progressing toward goal SLP Short Term Goal 4 (Week 1): Pt will complete basic familiar problem solving tasks related to ADLs with Mod A cues.  SLP Short Term Goal 4 - Progress (Week 1): Progressing toward goal SLP Short Term Goal 5 (Week 1): Pt will consume current diet with minimal overt s/s of aspiration and Min A cues for use of compensatory swallow strategies.  SLP Short Term Goal 5 - Progress (Week 1): Met    New Short Term Goals: Week 2: SLP Short Term Goal 1 (Week 2): Pt will sustain attention to basic familiar task  for ~ 15 minutes with Mod A cues.  SLP Short Term Goal 2 (Week 2): Pt will convey immediate needs/wants to caregivers with mod assist verbal cues.   SLP Short Term Goal 3 (Week 2): Pt will increase speech intelligibility at the simple phrase level to achieve ~ 75% intelligibility given Mod A cues for use of speech intelligibility strategies.  SLP Short Term Goal 4 (Week 2): Pt will complete basic familiar problem solving tasks related to ADLs  with Mod A cues.  SLP Short Term Goal 5 (Week 2): Pt will consume therapeutic trials of thin liquids with minimal overt s/s of aspiration and Min A cues for use of compensatory swallow strategies.   Weekly Progress Updates:  Pt is making slow but functional progress and has met 2 out of 5 short term goals this reporting period.  Pt is currently min-mod assist for verbal expression during structured tasks but needs anywhere from mod-max assist for less structured, functional communication.  Pt is consuming dys 2 textures and thin liquids with min cues for use of swallowing precautions and with minimal overt s/s of aspiration.  Pt would continue to benefit from skilled ST while inpatient in order to maximize functional independence and reduce burden of care prior to discharge.  Anticipate that pt will need 24/7 supervision at discharge in addition to intensive ST follow up at next level of care.     Intensity: Minumum of 1-2 x/day, 30 to 90 minutes Frequency: 3 to 5 out of 7 days Duration/Length of Stay: 21 to 28 days Treatment/Interventions: Cognitive remediation/compensation;Cueing hierarchy;Dysphagia/aspiration precaution training;Functional tasks;Patient/family education;Therapeutic Activities;Speech/Language facilitation;Internal/external aids   Daily Session  Skilled Therapeutic Interventions: Pt was seen for skilled ST targeting cognitive goals.  Pt was asleep upon arrival and only briefly arousable to voice and light touch.  [name] reports that pt got Seroquel at 10:30 last night which could  be contributing to fatigue.  Pt was transferred to recliner in the hopes of improving alertness; however pt remained alert for only brief periods of time before needing max cues and stimulation to awaken.  Pt grimacing in pain with movement and indicated via head nod that he wanted pain meds. RN made aware.  Pt needed max assist to focus his attention briefly on self feeding tasks due to lethargy, but  ultimately self feeding was stopped for pt's safety.  Discussed waiting for pt to demonstrate improved arousal before giving him anything to eat or drink with pt's wife who verbalized understanding.  Goals updated on this date to reflect current progress and plan of care.        Function:   Eating Eating                 Cognition Comprehension Comprehension assist level: Understands basic 75 - 89% of the time/ requires cueing 10 - 24% of the time  Expression   Expression assist level: Expresses basic 25 - 49% of the time/requires cueing 50 - 75% of the time. Uses single words/gestures.  Social Interaction Social Interaction assist level: Interacts appropriately 25 - 49% of time - Needs frequent redirection.  Problem Solving Problem solving assist level: Solves basic 25 - 49% of the time - needs direction more than half the time to initiate, plan or complete simple activities  Memory Memory assist level: Recognizes or recalls less than 25% of the time/requires cueing greater than 75% of the time   General    Pain Pain Assessment Pain Assessment: Faces Faces Pain Scale: Hurts little more Pain Type: Acute pain Pain Location: Generalized Pain Descriptors / Indicators: Aching Pain Intervention(s): RN made aware;Repositioned  Therapy/Group: Individual Therapy  Johari Pinney, Selinda Orion 05/25/2017, 12:34 PM

## 2017-05-26 ENCOUNTER — Encounter (HOSPITAL_COMMUNITY): Payer: Self-pay

## 2017-05-26 ENCOUNTER — Inpatient Hospital Stay (HOSPITAL_COMMUNITY): Payer: PPO | Admitting: Physical Therapy

## 2017-05-26 ENCOUNTER — Other Ambulatory Visit: Payer: Self-pay

## 2017-05-26 LAB — GLUCOSE, CAPILLARY
Glucose-Capillary: 136 mg/dL — ABNORMAL HIGH (ref 65–99)
Glucose-Capillary: 157 mg/dL — ABNORMAL HIGH (ref 65–99)
Glucose-Capillary: 165 mg/dL — ABNORMAL HIGH (ref 65–99)
Glucose-Capillary: 170 mg/dL — ABNORMAL HIGH (ref 65–99)
Glucose-Capillary: 170 mg/dL — ABNORMAL HIGH (ref 65–99)

## 2017-05-26 MED ORDER — GLUCERNA SHAKE PO LIQD
237.0000 mL | Freq: Three times a day (TID) | ORAL | Status: DC
  Administered 2017-05-26 – 2017-06-09 (×38): 237 mL via ORAL

## 2017-05-26 NOTE — Progress Notes (Signed)
Physical Therapy Weekly Progress Note  Patient Details  Name: Brendan [name] MRN: [id number] Date of Birth: 07/28/1940  Beginning of progress report period: May 19, 2017 End of progress report period: May 26, 2017  Today's Date: 05/26/2017 PT Individual Time: 0900-1000 PT Individual Time Calculation (min): 60 min   Patient has met 0 of 4 short term goals.  Pt is making slow but steady progress towards goals. Pt is currently Max to total assist for transfers and bed mobility, is able to ambulate short distances with RW (8 ft), and is able to manually propel w/c about 25 ft with minimal assistance. Barriers to patient's progress include increased fatigue/lethargy resulting in decreased ability to engage in therapy session as well as cognitive deficits limiting ability to perform functional therapy tasks. Pt has good family support and family has been present during most therapy sessions.  Patient continues to demonstrate the following deficits muscle weakness, impaired timing and sequencing and decreased motor planning, decreased attention to right and decreased initiation, decreased attention, decreased awareness, decreased problem solving, decreased safety awareness, decreased memory and delayed processing and therefore will continue to benefit from skilled PT intervention to increase functional independence with mobility.  Patient progressing toward long term goals..  Continue plan of care.  PT Short Term Goals Week 1:  PT Short Term Goal 1 (Week 1): Pt will increase bed mobility to mod A. PT Short Term Goal 1 - Progress (Week 1): Progressing toward goal PT Short Term Goal 2 (Week 1): Pt will increase transfers to mod A.  PT Short Term Goal 2 - Progress (Week 1): Progressing toward goal PT Short Term Goal 3 (Week 1): Pt will ambulate with LRAD about 25 feet with mod A.  PT Short Term Goal 3 - Progress (Week 1): Progressing toward goal PT Short Term Goal 4 (Week 1): Pt will  ascend/descend 4 stairs with B rails and max A.  PT Short Term Goal 4 - Progress (Week 1): Progressing toward goal PT Short Term Goal 5 (Week 1): Pt will propel w/c about 25 feet with min A.  PT Short Term Goal 5 - Progress (Week 1): Progressing toward goal Week 2:  PT Short Term Goal 1 (Week 2): Pt will increase bed mobility to Mod A PT Short Term Goal 2 (Week 2): Pt will increase transfers to Mod A PT Short Term Goal 3 (Week 2): Pt will ambulate with LRAD about 25 feet with mod A.  PT Short Term Goal 4 (Week 2): Pt will ascend/descend 4 stairs with B rails and max A.  PT Short Term Goal 5 (Week 2): Pt will propel w/c x 50 ft with Min A  Skilled Therapeutic Interventions/Progress Updates:  Ambulation/gait training;Balance/vestibular training;Community reintegration;DME/adaptive equipment instruction;Functional electrical stimulation;Neuromuscular re-education;Functional mobility training;Patient/family education;Stair training;Splinting/orthotics;Therapeutic Activities;Therapeutic Exercise;UE/LE Coordination activities;UE/LE Strength taining/ROM;Wheelchair propulsion/positioning   Pt supine in bed upon therapist arrival, reports no pain this AM. Pt has had incontinence of bowel, rolling L/R with Min Assist, dependent for pericare, donning brief and pants. Supine to sit Max Assist with HOB elevated. Attempt squat pivot transfer to the L from bed to w/c, pt unable to follow cues to properly initiate and participate in transfer. Sit to stand Max Assist to RW with L orthosis, stand pivot transfer bed to w/c with max cues for LE placement and safety during transfer. Assist pt with donning shirt in sitting with use of mirror for upright posture and visual feedback. Reaching outside BOS with LUE while WBing through RLE,  focus on naming colors of objects and scanning visual field to find objects, use of mirror for visual feedback for upright posture. Pt left seated in w/c in room with needs in reach, daughter  present.  Therapy Documentation Precautions:  Precautions Precautions: Fall Precaution Comments: Lt gaze preference, Rt hemi, aphasic, R inattention Restrictions Weight Bearing Restrictions: No  See Function Navigator for Current Functional Status.  Therapy/Group: Individual Therapy  Excell Seltzer, PT, DPT  05/26/2017, 12:12 PM

## 2017-05-26 NOTE — Progress Notes (Signed)
Initial Nutrition Assessment  DOCUMENTATION CODES:   Obesity unspecified  INTERVENTION:    Continue Ensure Enlive po BID, each supplement provides 350 kcal and 20 grams of protein  NUTRITION DIAGNOSIS:   Inadequate oral intake related to poor appetite as evidenced by meal completion < 50%  GOAL:   Patient will meet greater than or equal to 90% of their needs  MONITOR:   PO intake, Supplement acceptance, Labs, Skin, Weight trends  REASON FOR ASSESSMENT:   Malnutrition Screening Tool  ASSESSMENT:   77 y.o.right handed male with history of CAD with stenting as well as CABG, hypertension, memory loss and bilateral deep brain stimulator electrode placement per Dr. Venetia MaxonStern 05/04/2017 for essential tremors.  Pt unavailable at time of RD visit. Was being followed per Nutrition Team during acute hospitalization. Pt with hx of poor PO intake. Likes Ensure Enlive supplements.  Speech Path following for dysphagia treatment. PO intake currently 25-40% per flowsheet records. Has Ensure Enlive ordered BID.  Weight has fluctuated per readings below. Labs and medications reviewed. CBG's [id number].  NUTRITION - FOCUSED PHYSICAL EXAM:  Unable to complete at this time.  Diet Order:  Fall precautions DIET DYS 2 Room service appropriate? Yes; Fluid consistency: Thin  EDUCATION NEEDS:   Not appropriate for education at this time  Skin:  Skin Assessment: Skin Integrity Issues: Skin Integrity Issues:: DTI DTI: buttocks  Last BM:  2/8  Height:   Ht Readings from Last 1 Encounters:  05/18/17 5\' 9"  (1.753 m)   Weight:   Wt Readings from Last 1 Encounters:  05/18/17 202 lb 12.8 oz (92 kg)   Wt Readings from Last 10 Encounters:  05/18/17 202 lb 12.8 oz (92 kg)  05/16/17 195 lb 15.8 oz (88.9 kg)  05/04/17 211 lb (95.7 kg)  04/26/17 211 lb 6.4 oz (95.9 kg)  04/25/17 211 lb 6.4 oz (95.9 kg)  04/20/17 202 lb (91.6 kg)  03/06/17 203 lb (92.1 kg)  02/13/17 207 lb (93.9 kg)   11/28/16 212 lb 6.4 oz (96.3 kg)  10/20/16 212 lb (96.2 kg)   Ideal Body Weight:     BMI:  Body mass index is 29.95 kg/m.  Estimated Nutritional Needs:   Kcal:  1900-2100  Protein:  95-110 gm  Fluid:  1.9-2.1 L  Maureen ChattersKatie Corwin Kuiken, RD, LDN Pager #: [phone] After-Hours Pager #: [phone]

## 2017-05-26 NOTE — Progress Notes (Signed)
Subjective/Complaints: Patient seen lying in bed this morning. Daughter at bedside. Per daughter, patient slept well overnight. He is more alert this a.m. He did have significant amount of lethargy yesterday, potentially due to medications. Also discussed case with insurance company.  ROS: Unable to assess due to aphasia.  Objective: Vital Signs: Blood pressure (!) 104/56, pulse (!) 54, temperature 97.6 F (36.4 C), temperature source Oral, resp. rate 18, height 5' 9" (1.753 m), weight 92 kg (202 lb 12.8 oz), SpO2 100 %. No results found. Results for orders placed or performed during the hospital encounter of 05/18/17 (from the past 72 hour(s))  Glucose, capillary     Status: Abnormal   Collection Time: 05/23/17  4:49 PM  Result Value Ref Range   Glucose-Capillary 132 (H) 65 - 99 mg/dL  Glucose, capillary     Status: Abnormal   Collection Time: 05/23/17  9:09 PM  Result Value Ref Range   Glucose-Capillary 184 (H) 65 - 99 mg/dL  Glucose, capillary     Status: Abnormal   Collection Time: 05/24/17  6:48 AM  Result Value Ref Range   Glucose-Capillary 183 (H) 65 - 99 mg/dL  Glucose, capillary     Status: Abnormal   Collection Time: 05/24/17 11:34 AM  Result Value Ref Range   Glucose-Capillary 206 (H) 65 - 99 mg/dL  Glucose, capillary     Status: Abnormal   Collection Time: 05/24/17  4:43 PM  Result Value Ref Range   Glucose-Capillary 161 (H) 65 - 99 mg/dL  Glucose, capillary     Status: Abnormal   Collection Time: 05/24/17  8:48 PM  Result Value Ref Range   Glucose-Capillary 155 (H) 65 - 99 mg/dL  Basic metabolic panel     Status: Abnormal   Collection Time: 05/25/17  5:06 AM  Result Value Ref Range   Sodium 135 135 - 145 mmol/L   Potassium 4.9 3.5 - 5.1 mmol/L   Chloride 100 (L) 101 - 111 mmol/L   CO2 25 22 - 32 mmol/L   Glucose, Bld 151 (H) 65 - 99 mg/dL   BUN 26 (H) 6 - 20 mg/dL   Creatinine, Ser 1.17 0.61 - 1.24 mg/dL   Calcium 8.8 (L) 8.9 - 10.3 mg/dL   GFR calc non  Af Amer 60 (L) >60 mL/min   GFR calc Af Amer >60 >60 mL/min    Comment: (NOTE) The eGFR has been calculated using the CKD EPI equation. This calculation has not been validated in all clinical situations. eGFR's persistently <60 mL/min signify possible Chronic Kidney Disease.    Anion gap 10 5 - 15    Comment: Performed at [facility] [address] [place], [place] [address]  Glucose, capillary     Status: Abnormal   Collection Time: 05/25/17  6:34 AM  Result Value Ref Range   Glucose-Capillary 156 (H) 65 - 99 mg/dL  Glucose, capillary     Status: Abnormal   Collection Time: 05/25/17 11:23 AM  Result Value Ref Range   Glucose-Capillary 140 (H) 65 - 99 mg/dL  Glucose, capillary     Status: Abnormal   Collection Time: 05/25/17  4:56 PM  Result Value Ref Range   Glucose-Capillary 133 (H) 65 - 99 mg/dL  Glucose, capillary     Status: Abnormal   Collection Time: 05/26/17  1:58 AM  Result Value Ref Range   Glucose-Capillary 136 (H) 65 - 99 mg/dL  Glucose, capillary     Status: Abnormal  Collection Time: 05/26/17  6:47 AM  Result Value Ref Range   Glucose-Capillary 157 (H) 65 - 99 mg/dL  Glucose, capillary     Status: Abnormal   Collection Time: 05/26/17 11:30 AM  Result Value Ref Range   Glucose-Capillary 165 (H) 65 - 99 mg/dL   Comment 1 Notify RN     General: NAD. Well-developed.  HEENT: Normocephalic, atraumatic. Cardio: RRR and no JVD. Resp: CTA B/L and Unlabored GI: BS positive and ND Musc/Skel:  No edema, no tenderness. Neuro: Alert,  Expressive >> Receptive aphasia Motor: LUE/LLE: Grossly 5/5 proximal to distal RUE/RLE: 0/5 proximal to distal (stable), increase tone UE and LE LUE tremor Dysarthria Skin:   Intact. Warm and dry   Assessment/Plan: 1. Functional deficits secondary to left frontal parietal intraparenchymal hemorrhage with right hemiparesis, aphasia and cognitive deficits which require 3+ hours per day of interdisciplinary therapy in a  comprehensive inpatient rehab setting. Physiatrist is providing close team supervision and 24 hour management of active medical problems listed below. Physiatrist and rehab team continue to assess barriers to discharge/monitor patient progress toward functional and medical goals. FIM: Function - Bathing Position: Wheelchair/chair at sink Body parts bathed by patient: Left arm, Chest, Abdomen, Front perineal area, Right upper leg, Left upper leg, Right lower leg, Left lower leg Body parts bathed by helper: Buttocks, Back, Right arm Assist Level: Touching or steadying assistance(Pt > 75%)  Function- Upper Body Dressing/Undressing What is the patient wearing?: Button up shirt Button up shirt - Perfomed by patient: Thread/unthread left sleeve Button up shirt - Perfomed by helper: Thread/unthread right sleeve, Pull shirt around back, Button/unbutton shirt Assist Level: (MaxA) Function - Lower Body Dressing/Undressing What is the patient wearing?: Pants, Non-skid slipper socks, Ted Hose Position: Wheelchair/chair at sink Pants- Performed by patient: Thread/unthread left pants leg Pants- Performed by helper: Pull pants up/down, Thread/unthread right pants leg Non-skid slipper socks- Performed by helper: Don/doff right sock, Don/doff left sock TED Hose - Performed by helper: Don/doff right TED hose, Don/doff left TED hose Assist for footwear: Dependant Assist for lower body dressing: (1 helper)  Function - Toileting Toileting activity did not occur: No continent bowel/bladder event Toileting steps completed by helper: Adjust clothing prior to toileting, Performs perineal hygiene, Adjust clothing after toileting Assist level: Two helpers(per Jaime Bobbitt, NT report)  Function - Toilet Transfers Toilet transfer activity did not occur: (not attempted) Assist level to toilet: 2 helpers(per Jaime Bobbitt, NT report)  Function - Chair/bed transfer Chair/bed transfer method: Squat  pivot Chair/bed transfer assist level: dependent (Pt equals 0%) Chair/bed transfer assistive device: Walker, Armrests Chair/bed transfer details: Manual facilitation for placement, Manual facilitation for weight shifting, Verbal cues for safe use of DME/AE, Verbal cues for precautions/safety  Function - Locomotion: Wheelchair Type: Manual Wheelchair activity did not occur: Safety/medical concerns Max wheelchair distance: 100 ft Assist Level: Touching or steadying assistance (Pt > 75%) Wheel 50 feet with 2 turns activity did not occur: Safety/medical concerns Assist Level: Touching or steadying assistance (Pt > 75%) Wheel 150 feet activity did not occur: Safety/medical concerns Function - Locomotion: Ambulation Assistive device: Walker-rolling Max distance: 8 Assist level: Maximal assist (Pt 25 - 49%) Walk 10 feet activity did not occur: Safety/medical concerns Assist level: Moderate assist (Pt 50 - 74%) Walk 50 feet with 2 turns activity did not occur: Safety/medical concerns Assist level: Moderate assist (Pt 50 - 74%) Walk 150 feet activity did not occur: Safety/medical concerns Walk 10 feet on uneven surfaces activity did not occur:   Safety/medical concerns  Function - Comprehension Comprehension: Auditory Comprehension assist level: Understands basic 75 - 89% of the time/ requires cueing 10 - 24% of the time  Function - Expression Expression: Verbal Expression assist level: Expresses basic 25 - 49% of the time/requires cueing 50 - 75% of the time. Uses single words/gestures.  Function - Social Interaction Social Interaction assist level: Interacts appropriately 25 - 49% of time - Needs frequent redirection.  Function - Problem Solving Problem solving assist level: Solves basic 25 - 49% of the time - needs direction more than half the time to initiate, plan or complete simple activities  Function - Memory Memory assist level: Recognizes or recalls less than 25% of the  time/requires cueing greater than 75% of the time Patient normally able to recall (first 3 days only): That he or she is in a hospital  Medical Problem List and Plan: 1.Decreased functional mobilitysecondary to left frontal intraparenchymal hemorrhage with recent deep brain stimulator for tremor.    Cont CIR   WHO/PRAFO in place 2. DVT Prophylaxis/Anticoagulation: SCDs. Monitor for any signs of DVT 3. Pain Management:Neurontin 300 mg 3 times a day, Tylenol as needed 4. Mood:Provide emotional support 5. Neuropsych: This patientisnot capable of making decisions onhisown behalf.   limit neuro-sedating meds when possible   Will consider stimulant 6. Skin/Wound Care:Routine skin checks 7. Fluids/Electrolytes/Nutrition:Routine I&O's 8.Hypertension.    Cozaar 100 mg daily, decreased to 50 on 2/5   Lasix 20 mg daily d/ced on 2/5. Vitals:   05/25/17 2041 05/26/17 0455  BP: (!) 115/53 (!) 104/56  Pulse: (!) 56 (!) 54  Resp:  18  Temp:  97.6 F (36.4 C)  SpO2:  100%    Relatively controlled on 2/9 9.CAD with stenting/CABG. No chest pain or shortness of breath 10.Hyperlipidemia. Lipitor,Lovaza 11. Tremors:   mysoline and inderal ongoing   simulator not operational yet 12.  Dysphagia   Cont D2 thins    Advance diet as tolerated   CXR reviewed, unremarkable for acute process 13.  Hypoalb- Prostat 14. Prediabetes   Diet change to cut modified, CBGs and SSI started on 2/6   Relatively controlled on 2/9 15. ABLA   Hb 11.3 on 2/4   Labs ordered for Monday   Cont to monitor 16. Confusion  Multifactorial due hypotension and sleep/wake changes and intracranial bleed/aphasia   CXR reviewed, unremarkable for acute changes   UA unremarkable, urine culture NG 17. Sleep disturbance  Trazodone 50 qhs scheduled on 2/5, changed to Seroquel 12.5 on 2/7   Improving   Educated wife on environment modifications 18. Spastic hemiplegia  Baclofen 5 TID started on 2/7  Cont ROM  Cont  bracing 19. AKI  Cr 1.17 on 2/8  Encourage fluids   Labs ordered for Monday  Will consider IVF if necessary  >35 minutes spent, with >30 minutes in counseling and coordination of care with team regarding deficits, progress, limitation, and discharge planning  LOS (Days) 8 A FACE TO FACE EVALUATION WAS PERFORMED  Brendan [name] Lorie Phenix 05/26/2017, 11:52 AM

## 2017-05-26 NOTE — Progress Notes (Signed)
Occupational Therapy Session Note  Patient Details  Name: Brendan [name] MRN: [id number] Date of Birth: 09/01/1940  Today's Date: 05/26/2017 OT Individual Time: 1103-1130 OT Individual Time Calculation (min): 27 min   Short Term Goals: Week 1:  OT Short Term Goal 1 (Week 1): Pt will complete 1/4 components of donning shirt OT Short Term Goal 2 (Week 1): Pt will complete toilet transfer with Mod A 1 helper OT Short Term Goal 3 (Week 1): Pt will scan to the Rt for 1 ADL item with mod vcs OT Short Term Goal 4 (Week 1): Pt will initiate washing 3/11 body areas during bathing     Skilled Therapeutic Interventions/Progress Updates:    Pt greeted in w/c with daughter [name] present. Alert, and when asked if he wanted to participate in makeup therapy, responded with "please." Tx focus on Rt NMR, Rt attention, and following 1 step instruction. Gentle AAROM/PROM completed to Rt arm at elbow, wrist, and digit joints. Pt externally distracted and required max multimodal cues to assist therapist with movement. Worked on sustained attention while he applied lotion to Rt forearm, and also during shoulder shrugs and rolls for improving shoulder approximation. Dtr present and provided education regarding subluxation and positioning strategies to protect Rt shoulder. At end of tx pt was left with daughter and all needs.   Therapy Documentation Precautions:  Precautions Precautions: Fall Precaution Comments: Lt gaze preference, Rt hemi, aphasic, R inattention Restrictions Weight Bearing Restrictions: No Pain: Pain Assessment Pain Assessment: No/denies pain ADL: ADL ADL Comments: Please see functional navigator for ADL status     See Function Navigator for Current Functional Status.   Therapy/Group: Individual Therapy  Bryanah Sidell A Chalise Pe 05/26/2017, 12:28 PM

## 2017-05-27 ENCOUNTER — Inpatient Hospital Stay (HOSPITAL_COMMUNITY): Payer: PPO

## 2017-05-27 LAB — GLUCOSE, CAPILLARY
Glucose-Capillary: 148 mg/dL — ABNORMAL HIGH (ref 65–99)
Glucose-Capillary: 158 mg/dL — ABNORMAL HIGH (ref 65–99)
Glucose-Capillary: 144 mg/dL — ABNORMAL HIGH (ref 65–99)
Glucose-Capillary: 195 mg/dL — ABNORMAL HIGH (ref 65–99)

## 2017-05-27 NOTE — Progress Notes (Signed)
Occupational Therapy Session Note  Patient Details  Name: Brendan [name] MRN: [id number] Date of Birth: [birth year]  Today's Date: 05/27/2017 OT Individual Time: 2162-4469 OT Individual Time Calculation (min): 55 min    Short Term Goals: Week 1:  OT Short Term Goal 1 (Week 1): Pt will complete 1/4 components of donning shirt OT Short Term Goal 2 (Week 1): Pt will complete toilet transfer with Mod A 1 helper OT Short Term Goal 3 (Week 1): Pt will scan to the Rt for 1 ADL item with mod vcs OT Short Term Goal 4 (Week 1): Pt will initiate washing 3/11 body areas during bathing   Skilled Therapeutic Interventions/Progress Updates:    1:1. Pt and wife present with no c/o pain. OT bathes BLE at bed level with VC for LE elevation when washing, rinsing and drying feet as well as threading BLE into pants, donning teds and donning socks. Pt rolls R with CGA and VC for attending to R bed rail and L with MOD A for RUE management to place brief. Pt brisdges hips with A to advance pants past hips. Pt supine to sitting with A for trunk elevation. Pt sits EOB for ~ 20 min while bathing UB with initially min A for sitting balance and VC for midline orientation increasing up to MAX A for sitting balance and placing hand to target on L to facilitate weight shifting to L. Pt requires MAX encouragement to reach for bathing items on L outside BOS to encourage midline orientation. Pt able to wash chest, stomach, and RUE sitting EOB. OT washes back and LUE. Pt ultimately requires total A for UB dressing for button up shirt. Exited session with pt supine in bed, wife present, bed exit alarm on and all needs met.   Therapy Documentation Precautions:  Precautions Precautions: Fall Precaution Comments: Lt gaze preference, Rt hemi, aphasic, R inattention Restrictions Weight Bearing Restrictions: No  See Function Navigator for Current Functional Status.   Therapy/Group: Individual Therapy  Tonny Branch 05/27/2017, 9:57 AM

## 2017-05-27 NOTE — Plan of Care (Signed)
  Not Progressing RH BLADDER ELIMINATION RH STG MANAGE BLADDER WITH ASSISTANCE Description STG Manage Bladder With max Assistance I&O cath  05/27/2017 1456 - Not Progressing by Lindsie Simar, Danella MaiersAshley M, RN RH SKIN INTEGRITY RH STG SKIN FREE OF INFECTION/BREAKDOWN Description With min assist  05/27/2017 1456 - Not Progressing by Deby Adger, Danella MaiersAshley M, RN  I/O cath -total assist  RH STG MAINTAIN SKIN INTEGRITY WITH ASSISTANCE Description STG Maintain Skin Integrity With min Assistance.  05/27/2017 1456 - Not Progressing by Yasmene Salomone, Danella MaiersAshley M, RN  Max assist  RH KNOWLEDGE DEFICIT RH STG INCREASE KNOWLEDGE OF DIABETES Description Patient will be able to direct caregivers using resources/reminders   05/27/2017 1456 - Not Progressing by Dajha Urquilla, Danella MaiersAshley M, RN

## 2017-05-27 NOTE — Progress Notes (Signed)
Subjective/Complaints: Patient seen lying in bed this morning.  Wife states patient slept well overnight.  Per daughter, patient had a good day in therapy yesterday.  His spontaneous speech appears improved this morning.  ROS: Unable to assess due to aphasia.  Objective: Vital Signs: Blood pressure (!) 105/43, pulse (!) 58, temperature 98.1 F (36.7 C), temperature source Oral, resp. rate 18, height _0  (1.753 m), weight 92 kg (202 lb 12.8 oz), SpO2 98 %. No results found. Results for orders placed or performed during the hospital encounter of 05/18/17 (from the past 72 hour(s))  Glucose, capillary     Status: Abnormal   Collection Time: 05/24/17  4:43 PM  Result Value Ref Range   Glucose-Capillary 161 (H) 65 - 99 mg/dL  Glucose, capillary     Status: Abnormal   Collection Time: 05/24/17  8:48 PM  Result Value Ref Range   Glucose-Capillary 155 (H) 65 - 99 mg/dL  Basic metabolic panel     Status: Abnormal   Collection Time: 05/25/17  5:06 AM  Result Value Ref Range   Sodium 135 135 - 145 mmol/L   Potassium 4.9 3.5 - 5.1 mmol/L   Chloride 100 (L) 101 - 111 mmol/L   CO2 25 22 - 32 mmol/L   Glucose, Bld 151 (H) 65 - 99 mg/dL   BUN 26 (H) 6 - 20 mg/dL   Creatinine, Ser 1.17 0.61 - 1.24 mg/dL   Calcium 8.8 (L) 8.9 - 10.3 mg/dL   GFR calc non Af Amer 60 (L) >60 mL/min   GFR calc Af Amer >60 >60 mL/min    Comment: (NOTE) The eGFR has been calculated using the CKD EPI equation. This calculation has not been validated in all clinical situations. eGFR's persistently <60 mL/min signify possible Chronic Kidney Disease.    Anion gap 10 5 - 15    Comment: Performed at [facility] [address], [place], [place] [address]  Glucose, capillary     Status: Abnormal   Collection Time: 05/25/17  6:34 AM  Result Value Ref Range   Glucose-Capillary 156 (H) 65 - 99 mg/dL  Glucose, capillary     Status: Abnormal   Collection Time: 05/25/17 11:23 AM  Result Value Ref Range   Glucose-Capillary 140 (H) 65 - 99 mg/dL  Glucose, capillary     Status: Abnormal   Collection Time: 05/25/17  4:56 PM  Result Value Ref Range   Glucose-Capillary 133 (H) 65 - 99 mg/dL  Glucose, capillary     Status: Abnormal   Collection Time: 05/26/17  1:58 AM  Result Value Ref Range   Glucose-Capillary 136 (H) 65 - 99 mg/dL  Glucose, capillary     Status: Abnormal   Collection Time: 05/26/17  6:47 AM  Result Value Ref Range   Glucose-Capillary 157 (H) 65 - 99 mg/dL  Glucose, capillary     Status: Abnormal   Collection Time: 05/26/17 11:30 AM  Result Value Ref Range   Glucose-Capillary 165 (H) 65 - 99 mg/dL   Comment 1 Notify RN   Glucose, capillary     Status: Abnormal   Collection Time: 05/26/17  4:53 PM  Result Value Ref Range   Glucose-Capillary 170 (H) 65 - 99 mg/dL  Glucose, capillary     Status: Abnormal   Collection Time: 05/26/17  8:58 PM  Result Value Ref Range   Glucose-Capillary 170 (H) 65 - 99 mg/dL  Glucose, capillary     Status: Abnormal   Collection  Time: 05/27/17  6:41 AM  Result Value Ref Range   Glucose-Capillary 148 (H) 65 - 99 mg/dL  Glucose, capillary     Status: Abnormal   Collection Time: 05/27/17 11:55 AM  Result Value Ref Range   Glucose-Capillary 158 (H) 65 - 99 mg/dL    General: NAD. Well-developed.  HEENT: Normocephalic, atraumatic. Cardio: RRR and no JVD. Resp: CTA B/L and unlabored GI: BS positive and ND Musc/Skel:  No edema, no tenderness. Neuro: Alert,  Expressive >> Receptive aphasia Motor: LUE/LLE: Grossly 5/5 proximal to distal RUE/RLE: 0/5 proximal to distal (unchanged), increase tone UE and LE LUE tremor Dysarthria Skin:   Intact. Warm and dry   Assessment/Plan: 1. Functional deficits secondary to left frontal parietal intraparenchymal hemorrhage with right hemiparesis, aphasia and cognitive deficits which require 3+ hours per day of interdisciplinary therapy in a comprehensive inpatient rehab setting. Physiatrist is providing  close team supervision and 24 hour management of active medical problems listed below. Physiatrist and rehab team continue to assess barriers to discharge/monitor patient progress toward functional and medical goals. FIM: Function - Bathing Position: Wheelchair/chair at sink Body parts bathed by patient: Left arm, Chest, Abdomen, Front perineal area, Right upper leg, Left upper leg, Right lower leg, Left lower leg Body parts bathed by helper: Buttocks, Back, Right arm Assist Level: Touching or steadying assistance(Pt > 75%)  Function- Upper Body Dressing/Undressing What is the patient wearing?: Button up shirt Button up shirt - Perfomed by patient: Thread/unthread left sleeve Button up shirt - Perfomed by helper: Thread/unthread right sleeve, Pull shirt around back, Button/unbutton shirt Assist Level: (MaxA) Function - Lower Body Dressing/Undressing What is the patient wearing?: Pants, Non-skid slipper socks, Ted Hose Position: Wheelchair/chair at [employer]- Performed by patient: Thread/unthread left pants leg Pants- Performed by helper: Pull pants up/down, Thread/unthread right pants leg Non-skid slipper socks- Performed by helper: Don/doff right sock, Don/doff left sock TED Hose - Performed by helper: Don/doff right TED hose, Don/doff left TED hose Assist for footwear: Dependant Assist for lower body dressing: (1 helper)  Function - Toileting Toileting activity did not occur: No continent bowel/bladder event Toileting steps completed by helper: Adjust clothing prior to toileting, Performs perineal hygiene, Adjust clothing after toileting Assist level: Two helpers(per Thea Alken, NT report)  Function - Air cabin crew transfer activity did not occur: (not attempted) Assist level to toilet: 2 helpers(per Thea Alken, NT report)  Function - Chair/bed transfer Chair/bed transfer method: Stand pivot Chair/bed transfer assist level: Maximal assist (Pt 25 - 49%/lift and  lower) Chair/bed transfer assistive device: Walker Chair/bed transfer details: Manual facilitation for placement, Manual facilitation for weight shifting, Verbal cues for safe use of DME/AE, Verbal cues for precautions/safety  Function - Locomotion: Wheelchair Type: Manual Wheelchair activity did not occur: Safety/medical concerns Max wheelchair distance: 100 ft Assist Level: Touching or steadying assistance (Pt > 75%) Wheel 50 feet with 2 turns activity did not occur: Safety/medical concerns Assist Level: Touching or steadying assistance (Pt > 75%) Wheel 150 feet activity did not occur: Safety/medical concerns Function - Locomotion: Ambulation Assistive device: Walker-rolling Max distance: 8 Assist level: Maximal assist (Pt 25 - 49%) Walk 10 feet activity did not occur: Safety/medical concerns Assist level: Moderate assist (Pt 50 - 74%) Walk 50 feet with 2 turns activity did not occur: Safety/medical concerns Assist level: Moderate assist (Pt 50 - 74%) Walk 150 feet activity did not occur: Safety/medical concerns Walk 10 feet on uneven surfaces activity did not occur: Safety/medical concerns  Function -  Comprehension Comprehension: Auditory Comprehension assist level: Understands basic 75 - 89% of the time/ requires cueing 10 - 24% of the time  Function - Expression Expression: Verbal Expression assist level: Expresses basic 25 - 49% of the time/requires cueing 50 - 75% of the time. Uses single words/gestures.  Function - Social Interaction Social Interaction assist level: Interacts appropriately 25 - 49% of time - Needs frequent redirection.  Function - Problem Solving Problem solving assist level: Solves basic 25 - 49% of the time - needs direction more than half the time to initiate, plan or complete simple activities  Function - Memory Memory assist level: Recognizes or recalls less than 25% of the time/requires cueing greater than 75% of the time Patient normally able to  recall (first 3 days only): That he or she is in a hospital  Medical Problem List and Plan: 1.Decreased functional mobilitysecondary to left frontal intraparenchymal hemorrhage with recent deep brain stimulator for tremor.    Cont CIR   WHO/PRAFO in place 2. DVT Prophylaxis/Anticoagulation: SCDs. Monitor for any signs of DVT 3. Pain Management:Neurontin 300 mg 3 times a day, Tylenol as needed 4. Mood:Provide emotional support 5. Neuropsych: This patientisnot capable of making decisions onhisown behalf.   limit neuro-sedating meds when possible   Will consider stimulant, however appears to be improving at present 6. Skin/Wound Care:Routine skin checks 7. Fluids/Electrolytes/Nutrition:Routine I&O's 8.Hypertension.    Cozaar 100 mg daily, decreased to 50 on 2/5   Lasix 20 mg daily d/ced on 2/5. Vitals:   05/26/17 2026 05/27/17 0425  BP: (!) 108/54 (!) 105/43  Pulse: 99 (!) 58  Resp:  18  Temp:  98.1 F (36.7 C)  SpO2:      Relatively controlled on 2/10 9.CAD with stenting/CABG. No chest pain or shortness of breath 10.Hyperlipidemia. Lipitor,Lovaza 11. Tremors:   mysoline and inderal ongoing   simulator not operational yet 12.  Dysphagia   Cont D2 thins    Advance diet as tolerated   CXR reviewed, unremarkable for acute process 13.  Hypoalb- Prostat 14. Prediabetes   Diet change to cut modified, CBGs and SSI started on 2/6   Relatively controlled on 2/10 15. ABLA   Hb 11.3 on 2/4   Labs ordered for tomorrow   Cont to monitor 16. Confusion  Multifactorial due hypotension and sleep/wake changes and intracranial bleed/aphasia   CXR reviewed, unremarkable for acute changes   UA unremarkable, urine culture NG 17. Sleep disturbance  Trazodone 50 qhs scheduled on 2/5, changed to Seroquel 12.5 on 2/7   Improvin continues to improve g   Educated wife on environment modifications 18. Spastic hemiplegia  Baclofen 5 TID started on 2/7  Cont ROM  Cont bracing 19.  AKI  Cr 1.17 on 2/8  Encourage fluids   Labs ordered for tomorrow  Will consider IVF if necessary  LOS (Days) 9 A FACE TO FACE EVALUATION WAS PERFORMED  Brendan [name] Phenix 05/27/2017, 2:42 PM

## 2017-05-28 ENCOUNTER — Inpatient Hospital Stay (HOSPITAL_COMMUNITY): Payer: PPO | Admitting: Occupational Therapy

## 2017-05-28 ENCOUNTER — Inpatient Hospital Stay (HOSPITAL_COMMUNITY): Payer: PPO | Admitting: Speech Pathology

## 2017-05-28 ENCOUNTER — Inpatient Hospital Stay (HOSPITAL_COMMUNITY): Payer: PPO

## 2017-05-28 ENCOUNTER — Inpatient Hospital Stay (HOSPITAL_COMMUNITY): Payer: PPO | Admitting: Physical Therapy

## 2017-05-28 DIAGNOSIS — N319 Neuromuscular dysfunction of bladder, unspecified: Secondary | ICD-10-CM

## 2017-05-28 LAB — BASIC METABOLIC PANEL
Anion gap: 11 (ref 5–15)
BUN: 25 mg/dL — ABNORMAL HIGH (ref 6–20)
CO2: 23 mmol/L (ref 22–32)
Calcium: 9.2 mg/dL (ref 8.9–10.3)
Chloride: 100 mmol/L — ABNORMAL LOW (ref 101–111)
Creatinine, Ser: 1.24 mg/dL (ref 0.61–1.24)
GFR calc Af Amer: >60 mL/min (ref >60)
GFR calc non Af Amer: 56 mL/min — ABNORMAL LOW (ref >60)
Glucose, Bld: 205 mg/dL — ABNORMAL HIGH (ref 65–99)
Potassium: 5.4 mmol/L — ABNORMAL HIGH (ref 3.5–5.1)
Sodium: 134 mmol/L — ABNORMAL LOW (ref 135–145)

## 2017-05-28 LAB — CBC WITH DIFFERENTIAL/PLATELET
Basophils Absolute: 0 10*3/uL (ref 0.0–0.1)
Basophils Relative: 0 %
Eosinophils Absolute: 0.2 10*3/uL (ref 0.0–0.7)
Eosinophils Relative: 3 %
HCT: 35.6 % — ABNORMAL LOW (ref 39.0–52.0)
Hemoglobin: 11.9 g/dL — ABNORMAL LOW (ref 13.0–17.0)
Lymphocytes Relative: 20 %
Lymphs Abs: 1.6 10*3/uL (ref 0.7–4.0)
MCH: 33.2 pg (ref 26.0–34.0)
MCHC: 33.4 g/dL (ref 30.0–36.0)
MCV: 99.4 fL (ref 78.0–100.0)
Monocytes Absolute: 0.5 10*3/uL (ref 0.1–1.0)
Monocytes Relative: 6 %
Neutro Abs: 5.4 10*3/uL (ref 1.7–7.7)
Neutrophils Relative %: 71 %
Platelets: 190 10*3/uL (ref 150–400)
RBC: 3.58 MIL/uL — ABNORMAL LOW (ref 4.22–5.81)
RDW: 14 % (ref 11.5–15.5)
WBC: 7.6 10*3/uL (ref 4.0–10.5)

## 2017-05-28 LAB — GLUCOSE, CAPILLARY
Glucose-Capillary: 172 mg/dL — ABNORMAL HIGH (ref 65–99)
Glucose-Capillary: 189 mg/dL — ABNORMAL HIGH (ref 65–99)
Glucose-Capillary: 145 mg/dL — ABNORMAL HIGH (ref 65–99)
Glucose-Capillary: 187 mg/dL — ABNORMAL HIGH (ref 65–99)

## 2017-05-28 MED ORDER — LOSARTAN POTASSIUM 50 MG PO TABS
50.0000 mg | ORAL_TABLET | Freq: Every day | ORAL | Status: DC
  Administered 2017-05-30 – 2017-06-09 (×10): 50 mg via ORAL
  Filled 2017-05-28 (×11): qty 1

## 2017-05-28 MED ORDER — QUETIAPINE FUMARATE 25 MG PO TABS
12.5000 mg | ORAL_TABLET | Freq: Every day | ORAL | Status: DC
  Administered 2017-05-28: 12.5 mg via ORAL
  Filled 2017-05-28: qty 1

## 2017-05-28 MED ORDER — TAMSULOSIN HCL 0.4 MG PO CAPS
0.8000 mg | ORAL_CAPSULE | Freq: Every day | ORAL | Status: DC
  Administered 2017-05-28 – 2017-06-08 (×12): 0.8 mg via ORAL
  Filled 2017-05-28 (×12): qty 2

## 2017-05-28 MED ORDER — MELATONIN 3 MG PO TABS
1.5000 mg | ORAL_TABLET | Freq: Every day | ORAL | Status: DC
  Administered 2017-05-28 – 2017-05-29 (×2): 1.5 mg via ORAL
  Filled 2017-05-28 (×2): qty 0.5

## 2017-05-28 MED ORDER — NON FORMULARY: 1.5000 mg | Freq: Every day | Status: DC

## 2017-05-28 NOTE — Progress Notes (Signed)
Occupational Therapy Weekly Progress Note  Patient Details  Name: Brendan [name] MRN: [id number] Date of Birth: 01-Apr-1941  Beginning of progress report period: May 19, 2017 End of progress report period: May 28, 2017  Today's Date: 05/28/2017 OT Individual Time: 3903-0092 OT Individual Time Calculation (min): 60 min    Patient has met 2 of 4 short term goals.  Pt is making slow progress towards goals.  Pt currently limited due to decreased arousal, requiring multimodal cues and frequent redirection to promote arousal to engage in treatment sessions. Pt continues to demonstrate Rt lean in sitting and standing.  Pt currently requires mod-max assist for self-care tasks with +2 for safety for LB bathing/dressing at sit > stand level and +2 for transfers for safety due to decreased initiation, Rt attention, and initiation.  Patient continues to demonstrate the following deficits: muscle weakness, muscle joint tightness and muscle paralysis, decreased cardiorespiratoy endurance, impaired timing and sequencing, abnormal tone, unbalanced muscle activation, motor apraxia, decreased coordination and decreased motor planning, decreased midline orientation, decreased attention to right, right side neglect and decreased motor planning, decreased initiation, decreased attention, decreased awareness, decreased problem solving, decreased safety awareness, decreased memory and delayed processing and decreased sitting balance, decreased standing balance, decreased postural control and hemiplegia and therefore will continue to benefit from skilled OT intervention to enhance overall performance with BADL and Reduce care partner burden.  Patient progressing toward long term goals..  Continue plan of care.  OT Short Term Goals Week 1:  OT Short Term Goal 1 (Week 1): Pt will complete 1/4 components of donning shirt OT Short Term Goal 1 - Progress (Week 1): Met OT Short Term Goal 2 (Week 1): Pt will complete  toilet transfer with Mod A 1 helper OT Short Term Goal 2 - Progress (Week 1): Progressing toward goal OT Short Term Goal 3 (Week 1): Pt will scan to the Rt for 1 ADL item with mod vcs OT Short Term Goal 3 - Progress (Week 1): Progressing toward goal OT Short Term Goal 4 (Week 1): Pt will initiate washing 3/11 body areas during bathing  OT Short Term Goal 4 - Progress (Week 1): Met Week 2:  OT Short Term Goal 1 (Week 2): Pt will complete toilet transfer with Mod A 1 helper OT Short Term Goal 2 (Week 2): Pt will scan to the Rt for 1 ADL item with mod vcs OT Short Term Goal 3 (Week 2): Pt will complete UB dressing with Mod assist OT Short Term Goal 4 (Week 2): Pt will complete bathing with mod assist and min cues for initiation/sequencing  Skilled Therapeutic Interventions/Progress Updates:    Treatment session with focus on visual scanning, trunk control, and sit > stand.  Pt received upright in w/c with noted improvements in sitting posture.  Engaged pt in visual scanning activity utilizing playing cards.  Pt able to locate items in Lt visual field 100% and at midline 50% of time.  Max cues and tapping to visually scan Rt of midline for sequencing of matching cards.  Graded activity to decrease challenge to matching only one number with field of 2 choices, continuing to require max cues.  Pt reports need to toilet.  Completed transfer to toilet with Stedy and +2 for safety.  Pt with improved upright trunk with cues to visually attend to therapist placed in front of pt.  Pt able to pull up in to standing with mod assist.  Returned to bed at end of session due to  fatigue, again utilizing [facility] for safety.  Left supine in bed with nurse tech present to assess vitals.  Therapy Documentation Precautions:  Precautions Precautions: Fall Precaution Comments: Lt gaze preference, Rt hemi, aphasic, R inattention Restrictions Weight Bearing Restrictions: No Pain: Pain Assessment Pain Assessment: No/denies  pain Pain Score: 0-No pain  See Function Navigator for Current Functional Status.   Therapy/Group: Individual Therapy  Simonne Come 05/28/2017, 9:47 AM

## 2017-05-28 NOTE — Progress Notes (Addendum)
Physical Therapy Note  Patient Details  Name: Brendan [name] MRN: [id number] Date of Birth: 12/31/1940 Today's Date: 05/28/2017  1300-1405, 65 min individual tx Pain: none per pt  neuromuscular re-education via forced use, positioning and multimodal cues for R attention, RLE motor control, using Kinetron in sitting with bil LEs alternating reciprocal movement x 20 cycles x 2, and with RLE only, resistance provided by PT, x 15 cycles x 2.  Sit> stand , pulling up with L hand on railing.  Pt tolerated standing x 2 minutes  X 2 with bil UE support, during wt shifting L><R.  R knee remained flexed throughout activity.    Pt noted to have blood seeping from L craniotomoty site.  RN informed. Charisse MarchJeanna, RN informed.  Gait training with weighted grocery cart, min assist to stand, pushing up with L hand on armrest of w/c, x 10' on level tile with mod assist and continuous cues for upright posture forward gaze, and assistance for RLE progression 50% of step.     Pt left resting in w/c with quick release belt applied, seat alarm set and all needs within reach. Dtr [name] present.  See function navigator for current status. [name] 05/28/2017, 11:32 AM

## 2017-05-28 NOTE — Progress Notes (Signed)
Speech Language Pathology Daily Session Note  Patient Details  Name: Brendan [name] MRN: [id number] Date of Birth: 06/06/1940  Today's Date: 05/28/2017 SLP Individual Time: [id number] SLP Individual Time Calculation (min): 45 min  Short Term Goals: Week 2: SLP Short Term Goal 1 (Week 2): Pt will sustain attention to basic familiar task  for ~ 15 minutes with Mod A cues.  SLP Short Term Goal 2 (Week 2): Pt will convey immediate needs/wants to caregivers with mod assist verbal cues.   SLP Short Term Goal 3 (Week 2): Pt will increase speech intelligibility at the simple phrase level to achieve ~ 75% intelligibility given Mod A cues for use of speech intelligibility strategies.  SLP Short Term Goal 4 (Week 2): Pt will complete basic familiar problem solving tasks related to ADLs with Mod A cues.  SLP Short Term Goal 5 (Week 2): Pt will consume therapeutic trials of thin liquids with minimal overt s/s of aspiration and Min A cues for use of compensatory swallow strategies.   Skilled Therapeutic Interventions: Skilled treatment session focused on communication and dysphagia goals. SLP received pt eating breakfast. Pt with dentures in place. SLP supplemented food items to include diced peaches and graham crackers. Pt insistent on wetting graham crackers in peach juice before consuming. On one occasion SLP able to convince pt to consume graham cracker dry. Pt with delayed throat clear. Don't recommend diet upgrade at this time. Pt able to sustain attention to MD for ~ 5 minutes while daughter asking questions. Pt required Max A cues to convey phrase level wants and needs d/t word finding deficits and decreased speech intelligibility. Pt was left in bed with daughter present at end of session. Continue per current plan of care.      Function:  Eating Eating   Modified Consistency Diet: Yes Eating Assist Level: Help with picking up utensils   Eating Set Up Assist For: Opening containers Helper Scoops  Food on Utensil: Occasionally Helper Brings Food to Mouth: Occasionally   Cognition Comprehension Comprehension assist level: Understands basic 75 - 89% of the time/ requires cueing 10 - 24% of the time  Expression   Expression assist level: Expresses basic 25 - 49% of the time/requires cueing 50 - 75% of the time. Uses single words/gestures.  Social Interaction Social Interaction assist level: Interacts appropriately 25 - 49% of time - Needs frequent redirection.  Problem Solving Problem solving assist level: Solves basic 25 - 49% of the time - needs direction more than half the time to initiate, plan or complete simple activities  Memory Memory assist level: Recognizes or recalls less than 25% of the time/requires cueing greater than 75% of the time    Pain    Therapy/Group: Individual Therapy  [name] 05/28/2017, 2:39 PM

## 2017-05-28 NOTE — Progress Notes (Addendum)
Physical Therapy Session Note  Patient Details  Name: Brendan [name] MRN: [id number] Date of Birth: 02/24/1941  Today's Date: 05/28/2017 PT Individual Time: 1004-1030 PT Individual Time Calculation (min): 26 min   Short Term Goals: Week 2:  PT Short Term Goal 1 (Week 2): Pt will increase bed mobility to Mod A PT Short Term Goal 2 (Week 2): Pt will increase transfers to Mod A PT Short Term Goal 3 (Week 2): Pt will ambulate with LRAD about 25 feet with mod A.  PT Short Term Goal 4 (Week 2): Pt will ascend/descend 4 stairs with B rails and max A.  PT Short Term Goal 5 (Week 2): Pt will propel w/c x 50 ft with Min A  Skilled Therapeutic Interventions/Progress Updates:  Pt received in bed with daughter present for session. Pt requires total assistance with hooking RLE with LLE to transfer to EOB. Pt completes supine>sidelying>sitting EOB with max assist with therapist providing cues to turn to look at R rail and to hold bed rail. Pt requires manual facilitation to come to upright sitting. Pt unable to recall age & attempted to use calendar to orient to date to calculate current age. Pt's daughter provided cuing for correctly verbalizing current day & date. Pt maintains balance sitting at EOB with steady assist. Pt agreeable to transferring to chair and completed bed>recliner via stand pivot with mod assist. Pt requires cuing for anterior weight shifting to allow pt to stand and to scoot back in chair once sitting. Once in recliner therapist performed PROM to RUE & RLE with some tightness noted in R hip & ankle. Therapist provided verbal education & demonstration to daughter regarding performing PROM to RLE.  Pt grimacing in pain when therapist performed PROM to RLE. At end of session pt reported aching in chest & RN made aware. SpO2 = 98% on room air, HR = 62 bpm. Pt left in recliner with BLE elevated, QRB donned, daughter present & in care of RN.  Therapy Documentation Precautions:   Precautions Precautions: Fall Precaution Comments: Lt gaze preference, Rt hemi, aphasic, R inattention Restrictions Weight Bearing Restrictions: No   See Function Navigator for Current Functional Status.   Therapy/Group: Individual Therapy  Sandi MariscalVictoria M [name] 05/28/2017, 12:25 PM

## 2017-05-28 NOTE — Progress Notes (Signed)
Physical Therapy Session Note  Patient Details  Name: Brendan [name] MRN: [id number] Date of Birth: 01/22/1941  Today's Date: 05/28/2017 PT Individual Time: 1135-1208 PT Individual Time Calculation (min): 33 min   Short Term Goals: Week 2:  PT Short Term Goal 1 (Week 2): Pt will increase bed mobility to Mod A PT Short Term Goal 2 (Week 2): Pt will increase transfers to Mod A PT Short Term Goal 3 (Week 2): Pt will ambulate with LRAD about 25 feet with mod A.  PT Short Term Goal 4 (Week 2): Pt will ascend/descend 4 stairs with B rails and max A.  PT Short Term Goal 5 (Week 2): Pt will propel w/c x 50 ft with Min A  Skilled Therapeutic Interventions/Progress Updates:    no c/o pain.  Session focus on R attention and functional transfers.    Pt transitions from recliner>standing with RW and min assist, requires min assist and max multimodal cues for turning to the L for pivot to w/c.  Pt engaged in graded peg board activity with a moderately complex design.  Pt initially needing only min cues for advancing to the next peg and to locate pegs in the bin on his R side, however after about 10 minutes had progressed to needing total assist 2/2 mental fatigue.  Returned to room in w/c total assist, positioned upright in w/c for lunch with QRB in place and needs met.   Therapy Documentation Precautions:  Precautions Precautions: Fall Precaution Comments: Lt gaze preference, Rt hemi, aphasic, R inattention Restrictions Weight Bearing Restrictions: No   See Function Navigator for Current Functional Status.   Therapy/Group: Individual Therapy   E  05/28/2017, 12:10 PM  

## 2017-05-28 NOTE — Progress Notes (Signed)
Subjective/Complaints: Patient seen lying in bed this morning, working with SLP.  Per daughter, patient did not sleep well overnight.  Daughter has several questions.  ROS: Unable to assess due to aphasia.  Objective: Vital Signs: Blood pressure (!) 103/52, pulse 63, temperature 98 F (36.7 C), temperature source Oral, resp. rate 18, height 5\' 9"  (1.753 m), weight 92 kg (202 lb 12.8 oz), SpO2 98 %. No results found. Results for orders placed or performed during the hospital encounter of 05/18/17 (from the past 72 hour(s))  Glucose, capillary     Status: Abnormal   Collection Time: 05/25/17 11:23 AM  Result Value Ref Range   Glucose-Capillary 140 (H) 65 - 99 mg/dL  Glucose, capillary     Status: Abnormal   Collection Time: 05/25/17  4:56 PM  Result Value Ref Range   Glucose-Capillary 133 (H) 65 - 99 mg/dL  Glucose, capillary     Status: Abnormal   Collection Time: 05/26/17  1:58 AM  Result Value Ref Range   Glucose-Capillary 136 (H) 65 - 99 mg/dL  Glucose, capillary     Status: Abnormal   Collection Time: 05/26/17  6:47 AM  Result Value Ref Range   Glucose-Capillary 157 (H) 65 - 99 mg/dL  Glucose, capillary     Status: Abnormal   Collection Time: 05/26/17 11:30 AM  Result Value Ref Range   Glucose-Capillary 165 (H) 65 - 99 mg/dL   Comment 1 Notify RN   Glucose, capillary     Status: Abnormal   Collection Time: 05/26/17  4:53 PM  Result Value Ref Range   Glucose-Capillary 170 (H) 65 - 99 mg/dL  Glucose, capillary     Status: Abnormal   Collection Time: 05/26/17  8:58 PM  Result Value Ref Range   Glucose-Capillary 170 (H) 65 - 99 mg/dL  Glucose, capillary     Status: Abnormal   Collection Time: 05/27/17  6:41 AM  Result Value Ref Range   Glucose-Capillary 148 (H) 65 - 99 mg/dL  Glucose, capillary     Status: Abnormal   Collection Time: 05/27/17 11:55 AM  Result Value Ref Range   Glucose-Capillary 158 (H) 65 - 99 mg/dL  Glucose, capillary     Status: Abnormal   Collection Time: 05/27/17  4:37 PM  Result Value Ref Range   Glucose-Capillary 144 (H) 65 - 99 mg/dL  Glucose, capillary     Status: Abnormal   Collection Time: 05/27/17  9:19 PM  Result Value Ref Range   Glucose-Capillary 195 (H) 65 - 99 mg/dL  Glucose, capillary     Status: Abnormal   Collection Time: 05/28/17  6:35 AM  Result Value Ref Range   Glucose-Capillary 172 (H) 65 - 99 mg/dL    General: NAD. Well-developed.  HEENT: Normocephalic, atraumatic. Cardio: RRR and no JVD. Resp: CTA B/L and Unlabored GI: BS positive and ND Musc/Skel:  No edema, no tenderness. Neuro: Alert  Expressive >> Receptive aphasia Motor: LUE/LLE: Grossly 5/5 proximal to distal RUE/RLE: 0/5 proximal to distal (stable), increase tone UE and LE LUE tremor Dysarthria Skin:   Intact. Warm and dry   Assessment/Plan: 1. Functional deficits secondary to left frontal parietal intraparenchymal hemorrhage with right hemiparesis, aphasia and cognitive deficits which require 3+ hours per day of interdisciplinary therapy in a comprehensive inpatient rehab setting. Physiatrist is providing close team supervision and 24 hour management of active medical problems listed below. Physiatrist and rehab team continue to assess barriers to discharge/monitor patient progress toward functional and medical goals.  FIM: Function - Bathing Position: Wheelchair/chair at sink Body parts bathed by patient: Left arm, Chest, Abdomen, Front perineal area, Right upper leg, Left upper leg, Right lower leg, Left lower leg Body parts bathed by helper: Buttocks, Back, Right arm Assist Level: Touching or steadying assistance(Pt > 75%)  Function- Upper Body Dressing/Undressing What is the patient wearing?: Button up shirt Button up shirt - Perfomed by patient: Thread/unthread left sleeve Button up shirt - Perfomed by helper: Thread/unthread right sleeve, Pull shirt around back, Button/unbutton shirt Assist Level: (MaxA) Function - Lower  Body Dressing/Undressing What is the patient wearing?: Pants, Non-skid slipper socks, Ted Hose Position: Wheelchair/chair at [employer]- Performed by patient: Thread/unthread left pants leg Pants- Performed by helper: Pull pants up/down, Thread/unthread right pants leg Non-skid slipper socks- Performed by helper: Don/doff right sock, Don/doff left sock TED Hose - Performed by helper: Don/doff right TED hose, Don/doff left TED hose Assist for footwear: Dependant Assist for lower body dressing: (1 helper)  Function - Toileting Toileting activity did not occur: No continent bowel/bladder event Toileting steps completed by helper: Adjust clothing prior to toileting, Performs perineal hygiene, Adjust clothing after toileting Assist level: Two helpers(per Maryagnes Amos, NT report)  Function - Archivist transfer activity did not occur: (not attempted) Assist level to toilet: 2 helpers(per Maryagnes Amos, NT report)  Function - Chair/bed transfer Chair/bed transfer method: Stand pivot Chair/bed transfer assist level: Maximal assist (Pt 25 - 49%/lift and lower) Chair/bed transfer assistive device: Walker Chair/bed transfer details: Manual facilitation for placement, Manual facilitation for weight shifting, Verbal cues for safe use of DME/AE, Verbal cues for precautions/safety  Function - Locomotion: Wheelchair Type: Manual Wheelchair activity did not occur: Safety/medical concerns Max wheelchair distance: 100 ft Assist Level: Touching or steadying assistance (Pt > 75%) Wheel 50 feet with 2 turns activity did not occur: Safety/medical concerns Assist Level: Touching or steadying assistance (Pt > 75%) Wheel 150 feet activity did not occur: Safety/medical concerns Function - Locomotion: Ambulation Assistive device: Walker-rolling Max distance: 8 Assist level: Maximal assist (Pt 25 - 49%) Walk 10 feet activity did not occur: Safety/medical concerns Assist level: Moderate assist  (Pt 50 - 74%) Walk 50 feet with 2 turns activity did not occur: Safety/medical concerns Assist level: Moderate assist (Pt 50 - 74%) Walk 150 feet activity did not occur: Safety/medical concerns Walk 10 feet on uneven surfaces activity did not occur: Safety/medical concerns  Function - Comprehension Comprehension: Auditory Comprehension assist level: Understands basic 75 - 89% of the time/ requires cueing 10 - 24% of the time  Function - Expression Expression: Verbal Expression assist level: Expresses basic 25 - 49% of the time/requires cueing 50 - 75% of the time. Uses single words/gestures.  Function - Social Interaction Social Interaction assist level: Interacts appropriately 25 - 49% of time - Needs frequent redirection.  Function - Problem Solving Problem solving assist level: Solves basic 25 - 49% of the time - needs direction more than half the time to initiate, plan or complete simple activities  Function - Memory Memory assist level: Recognizes or recalls less than 25% of the time/requires cueing greater than 75% of the time Patient normally able to recall (first 3 days only): That he or she is in a hospital  Medical Problem List and Plan: 1.Decreased functional mobilitysecondary to left frontal intraparenchymal hemorrhage with recent deep brain stimulator for tremor.    Cont CIR   WHO/PRAFO in place 2. DVT Prophylaxis/Anticoagulation: SCDs. Monitor for any signs of DVT 3.  Pain Management:Neurontin 300 mg 3 times a day, Tylenol as needed 4. Mood:Provide emotional support 5. Neuropsych: This patientisnot capable of making decisions onhisown behalf.   limit neuro-sedating meds when possible   Will consider stimulant, however appears to be improving at present 6. Skin/Wound Care:Routine skin checks 7. Fluids/Electrolytes/Nutrition:Routine I&O's 8.Hypertension.    Cozaar 100 mg daily, decreased to 50 on 2/5   Lasix 20 mg daily d/ced on 2/5. Vitals:   05/27/17  2047 05/28/17 0411  BP: (!) 117/58 (!) 103/52  Pulse: 79 63  Resp:  18  Temp:  98 F (36.7 C)  SpO2: 97% 98%    Relatively controlled on 2/11 9.CAD with stenting/CABG. No chest pain or shortness of breath 10.Hyperlipidemia. Lipitor,Lovaza 11. Tremors:   mysoline and inderal ongoing   simulator not operational yet 12.  Dysphagia   Cont D2 thins    Advance diet as tolerated   CXR reviewed, unremarkable for acute process 13.  Hypoalb- Prostat 14. Prediabetes   Diet change to cut modified, CBGs and SSI started on 2/6   Elevated on 2/11   Will consider metformin after lab results 15. ABLA   Hb 11.3 on 2/4   Labs pending   Cont to monitor 16. Confusion  Multifactorial due hypotension and sleep/wake changes and intracranial bleed/aphasia   CXR reviewed, unremarkable for acute changes   UA unremarkable, urine culture NG 17. Sleep disturbance  Trazodone 50 qhs scheduled on 2/5, changed to Seroquel 12.5 on 2/7   Melatonin started on 2/11   Educated wife on environment modifications 18. Spastic hemiplegia  Baclofen 5 TID started on 2/7  Cont ROM  Cont bracing 19. AKI  Cr 1.17 on 2/8  Encourage fluids   Labs pending   Will consider IVF if necessary 20.  Neurogenic bladder   Flomax increased to 0.8 on 2/11   Continue to monitor  >35 spent with patient and daughter with greater than 35 minutes in counseling and coordinating of care regarding prognosis, medications, discharge planning, sleep, agitation, bladder, therapies, DBS, etc.  LOS (Days) 10 A FACE TO FACE EVALUATION WAS PERFORMED  Ankit Karis Juba 05/28/2017, 9:27 AM

## 2017-05-28 NOTE — Progress Notes (Signed)
Called to room by PT, pt reported mid chest left pain (pinching) when he got oob. No radiation and pain relieved once seated and resting. VSS. No SOB, no other symptoms. RN made aware and PA notified of event. Pamelia HoitSharp, Forrest Jaroszewski B

## 2017-05-28 NOTE — Progress Notes (Signed)
Contacts Made to neurosurgery Dr. Hebert SohoJody Stern/Brian Poteat RN in regards to recent placement of deep brain stimulator and plan of care at the request of family

## 2017-05-29 ENCOUNTER — Inpatient Hospital Stay (HOSPITAL_COMMUNITY): Payer: PPO

## 2017-05-29 ENCOUNTER — Inpatient Hospital Stay (HOSPITAL_COMMUNITY): Payer: PPO | Admitting: Occupational Therapy

## 2017-05-29 ENCOUNTER — Inpatient Hospital Stay (HOSPITAL_COMMUNITY): Payer: PPO | Admitting: Physical Therapy

## 2017-05-29 ENCOUNTER — Inpatient Hospital Stay (HOSPITAL_COMMUNITY): Payer: PPO | Admitting: Speech Pathology

## 2017-05-29 DIAGNOSIS — N390 Urinary tract infection, site not specified: Secondary | ICD-10-CM

## 2017-05-29 DIAGNOSIS — B952 Enterococcus as the cause of diseases classified elsewhere: Secondary | ICD-10-CM

## 2017-05-29 LAB — BASIC METABOLIC PANEL
Anion gap: 11 (ref 5–15)
BUN: 27 mg/dL — ABNORMAL HIGH (ref 6–20)
CO2: 26 mmol/L (ref 22–32)
Calcium: 9.2 mg/dL (ref 8.9–10.3)
Chloride: 100 mmol/L — ABNORMAL LOW (ref 101–111)
Creatinine, Ser: 1.2 mg/dL (ref 0.61–1.24)
GFR calc Af Amer: >60 mL/min (ref >60)
GFR calc non Af Amer: 58 mL/min — ABNORMAL LOW (ref >60)
Glucose, Bld: 167 mg/dL — ABNORMAL HIGH (ref 65–99)
Potassium: 5.1 mmol/L (ref 3.5–5.1)
Sodium: 137 mmol/L (ref 135–145)

## 2017-05-29 LAB — GLUCOSE, CAPILLARY
Glucose-Capillary: 156 mg/dL — ABNORMAL HIGH (ref 65–99)
Glucose-Capillary: 148 mg/dL — ABNORMAL HIGH (ref 65–99)
Glucose-Capillary: 118 mg/dL — ABNORMAL HIGH (ref 65–99)
Glucose-Capillary: 195 mg/dL — ABNORMAL HIGH (ref 65–99)

## 2017-05-29 MED ORDER — GLIMEPIRIDE 1 MG PO TABS
1.0000 mg | ORAL_TABLET | Freq: Every day | ORAL | Status: DC
Start: 2017-05-29 — End: 2017-06-09
  Administered 2017-05-29 – 2017-06-09 (×12): 1 mg via ORAL
  Filled 2017-05-29 (×12): qty 1

## 2017-05-29 MED ORDER — BETHANECHOL CHLORIDE 10 MG PO TABS
10.0000 mg | ORAL_TABLET | Freq: Three times a day (TID) | ORAL | Status: DC
  Administered 2017-05-29 – 2017-06-06 (×27): 10 mg via ORAL
  Filled 2017-05-29 (×28): qty 1

## 2017-05-29 MED ORDER — QUETIAPINE FUMARATE 25 MG PO TABS
25.0000 mg | ORAL_TABLET | Freq: Every day | ORAL | Status: DC
  Administered 2017-05-29: 25 mg via ORAL
  Filled 2017-05-29: qty 1

## 2017-05-29 NOTE — Progress Notes (Signed)
Wife tearful after pt had a rough night, not sleeping much and pt scratching and picking apart his brief, wife states, "I don't know what I'm going to do at home with him", wife states she cannot perform I&O cath and that she doesn't think he could have a foley catheter at home because he would pull it out, "Like he did on the other unit last week". Emotional support given, encouraged pt to talk to family and church to devise a plan for pt's care.

## 2017-05-29 NOTE — Progress Notes (Signed)
Physical Therapy Session Note  Patient Details  Name: Brendan [name] MRN: [id number] Date of Birth: 10/06/1940  Today's Date: 05/29/2017 PT Individual Time: 1400-1430 PT Individual Time Calculation (min): 30 min   Short Term Goals: Week 2:  PT Short Term Goal 1 (Week 2): Pt will increase bed mobility to Mod A PT Short Term Goal 2 (Week 2): Pt will increase transfers to Mod A PT Short Term Goal 3 (Week 2): Pt will ambulate with LRAD about 25 feet with mod A.  PT Short Term Goal 4 (Week 2): Pt will ascend/descend 4 stairs with [name] rails and max A.  PT Short Term Goal 5 (Week 2): Pt will propel w/c x 50 ft with Min A  Skilled Therapeutic Interventions/Progress Updates:    Pt very lethargic during session requiring multimodal cues to maintain alertness for functional participation. Focused on NMR to address bed mobility, functional sitting balance and postural control re-training, and sit <> stands and standing postural control/tolerance. Pt requiring overall max assist throughout session for bed mobility and sit <> stands with min to max assist for sitting balance. Pt with poor postural control during dynamic or static task EOB even with UE support on L and no self correction noted with tactile and verbal cues. Multiple sit <> stands from EOB with slightly elevated surface with max assist and poor ability to maintain upright posture. Maintains flexed position, difficulty achieving full standing, and difficulty sustaining muscle activation in BLE and trunk to maintain position despite multimodal cues. [name] present during session and encouraging patient as able. End of session returned to supine with RUE supported on pillow and all needs in reach and wife at bedside.   Therapy Documentation Precautions:  Precautions Precautions: Fall Precaution Comments: Lt gaze preference, Rt hemi, aphasic, R inattention Restrictions Weight Bearing Restrictions: No   Pain:  Does not report pain.    See  Function Navigator for Current Functional Status.   Therapy/Group: Individual Therapy  Brendan [name], Brendan [name]  Brendan [name]. Brendan [name], PT, DPT  05/29/2017, 2:59 PM

## 2017-05-29 NOTE — Progress Notes (Signed)
Speech Language Pathology Daily Session Note  Patient Details  Name: Brendan [name] MRN: [id number] Date of Birth: 05/12/1940  Today's Date: 05/29/2017 SLP Individual Time: 0902-1000 SLP Individual Time Calculation (min): 58 min  Short Term Goals: Week 2: SLP Short Term Goal 1 (Week 2): Pt will sustain attention to basic familiar task  for ~ 15 minutes with Mod A cues.  SLP Short Term Goal 2 (Week 2): Pt will convey immediate needs/wants to caregivers with mod assist verbal cues.   SLP Short Term Goal 3 (Week 2): Pt will increase speech intelligibility at the simple phrase level to achieve ~ 75% intelligibility given Mod A cues for use of speech intelligibility strategies.  SLP Short Term Goal 4 (Week 2): Pt will complete basic familiar problem solving tasks related to ADLs with Mod A cues.  SLP Short Term Goal 5 (Week 2): Pt will consume therapeutic trials of thin liquids with minimal overt s/s of aspiration and Min A cues for use of compensatory swallow strategies.   Skilled Therapeutic Interventions:   Pt was seen for skilled ST targeting family education with pt's wife.  SLP facilitated the session with skilled education regarding rationale behind pt's currently prescribed diet and swallowing precautions.  SLP also discussed recommended dys 2 textures for in the home.  SLP discussed techniques to maximize pt's functional independence for communication and elicit word finding.  Furthermore, therapist provided skilled strategy instruction regarding cognition and overall safety at home, emphasizing techniques to help with sundowning and restlessness overnight as well as distraction management.  All questions were answered to pt's satisfaction at this time.  Pt was left in wheelchair with quick release belt donned and call bell within reach.  Wife at bedside. Continue per current plan of care.     Function:  Eating Eating                 Cognition Comprehension Comprehension assist  level: Understands basic 75 - 89% of the time/ requires cueing 10 - 24% of the time  Expression   Expression assist level: Expresses basic 50 - 74% of the time/requires cueing 25 - 49% of the time. Needs to repeat parts of sentences.  Social Interaction Social Interaction assist level: Interacts appropriately 50 - 74% of the time - May be physically or verbally inappropriate.  Problem Solving Problem solving assist level: Solves basic 25 - 49% of the time - needs direction more than half the time to initiate, plan or complete simple activities  Memory Memory assist level: Recognizes or recalls 25 - 49% of the time/requires cueing 50 - 75% of the time    Pain Pain Assessment Pain Assessment: No/denies pain  Therapy/Group: Individual Therapy  Kynzlee Hucker, Melanee SpryNicole L 05/29/2017, 10:07 AM

## 2017-05-29 NOTE — Plan of Care (Signed)
Pt requires total A with all care.

## 2017-05-29 NOTE — Progress Notes (Signed)
Occupational Therapy Session Note  Patient Details  Name: Brendan [name] W Siple MRN: [id number] Date of Birth: 11/05/1940  Today's Date: 05/29/2017 OT Individual Time: 1102-1200 OT Individual Time Calculation (min): 58 min    Short Term Goals: Week 2:  OT Short Term Goal 1 (Week 2): Pt will complete toilet transfer with Mod A 1 helper OT Short Term Goal 2 (Week 2): Pt will scan to the Rt for 1 ADL item with mod vcs OT Short Term Goal 3 (Week 2): Pt will complete UB dressing with Mod assist OT Short Term Goal 4 (Week 2): Pt will complete bathing with mod assist and min cues for initiation/sequencing  Skilled Therapeutic Interventions/Progress Updates:    Treatment session with focus on Rt attention, RUE NMR, and functional mobility.  Pt received supine in bed with wife present and reporting that he was restless most of the night.  Engaged in [facility] to [facility] with [facility]UE - pt able to initiate elbow extension with and without resistance.  Discussed providing family with PROM and self-ROM exercises for increased attention to and functional return of RUE.  Completed bed mobility with min assist and min cues for sequencing.  Squat pivot to Lt with focus on visual attention to destination with pt able to complete with min-mod assist for transfer.  Engaged in sit > stand outside parallel bars to provide UE support while utilizing mirror for visual feedback.  Engaged in sit <> stand throughout task with pt completing with min -mod assist depending on level of fatigue and attention.  Utilized mirror for pt to locate midline initially upon standing, therefore eliciting upright trunk posture and improving ability to scan to midline and just Rt of midline.  While standing pt placed items from table top in Rt and Lt field to container at midline.  Therapist provided blocking at Rt knee and manual assist to maintain Rt hand placement on grab bar while pt would push up from w/c and reach with Lt hand.  Pt demonstrated improved  arousal and participation throughout session.  Returned to room and left upright in w/c with quick release belt intact and wife present to assist with lunch.  Therapy Documentation Precautions:  Precautions Precautions: Fall Precaution Comments: Lt gaze preference, Rt hemi, aphasic, R inattention Restrictions Weight Bearing Restrictions: No Pain: Pain Assessment Pain Assessment: No/denies pain  See Function Navigator for Current Functional Status.   Therapy/Group: Individual Therapy  Rosalio LoudHOXIE, Lelaina Oatis 05/29/2017, 12:43 PM

## 2017-05-29 NOTE — Consult Note (Signed)
   [facility]   05/29/2017  Brendan BoJerry W [name] 01/22/1941 [id number]    Made aware of [facility] from inpatient rehab LCSW Valentina Gu([name]). [name] will need additional support when he returns home. Will follow up with patient and family regarding [facility]. Also Clinical research associatewriter is faxing referral information to [facility] to assist with aide services once [name] goes home. Made [name] aware that the aide services will not be more than 30 days. These services will not interfere or replace services provided by home health. Awaiting call back from [facility]' RN to discuss further and to request that they contact inpatient rehab LCSW to discuss details of aide services.   Will continue to follow and collaborate.   Raiford NobleAtika Kendryck Lacroix, MSN-Ed, RN,BSN [facility] Liaison [phone]

## 2017-05-29 NOTE — Progress Notes (Addendum)
Subjective/Complaints: Pt seen sitting up in bed this AM.  Wife states he did not sleep well overnight  She has questions about timing of Seroquel.  Discussed with nursing.   ROS: Unable to assess due to aphasia.  Objective: Vital Signs: Blood pressure (!) 113/52, pulse (!) 59, temperature 97.9 F (36.6 C), temperature source Oral, resp. rate 18, height _0  (1.753 m), weight 92 kg (202 lb 12.8 oz), SpO2 95 %. No results found. Results for orders placed or performed during the hospital encounter of 05/18/17 (from the past 72 hour(s))  Glucose, capillary     Status: Abnormal   Collection Time: 05/26/17 11:30 AM  Result Value Ref Range   Glucose-Capillary 165 (H) 65 - 99 mg/dL   Comment 1 Notify RN   Glucose, capillary     Status: Abnormal   Collection Time: 05/26/17  4:53 PM  Result Value Ref Range   Glucose-Capillary 170 (H) 65 - 99 mg/dL  Glucose, capillary     Status: Abnormal   Collection Time: 05/26/17  8:58 PM  Result Value Ref Range   Glucose-Capillary 170 (H) 65 - 99 mg/dL  Glucose, capillary     Status: Abnormal   Collection Time: 05/27/17  6:41 AM  Result Value Ref Range   Glucose-Capillary 148 (H) 65 - 99 mg/dL  Glucose, capillary     Status: Abnormal   Collection Time: 05/27/17 11:55 AM  Result Value Ref Range   Glucose-Capillary 158 (H) 65 - 99 mg/dL  Glucose, capillary     Status: Abnormal   Collection Time: 05/27/17  4:37 PM  Result Value Ref Range   Glucose-Capillary 144 (H) 65 - 99 mg/dL  Glucose, capillary     Status: Abnormal   Collection Time: 05/27/17  9:19 PM  Result Value Ref Range   Glucose-Capillary 195 (H) 65 - 99 mg/dL  Glucose, capillary     Status: Abnormal   Collection Time: 05/28/17  6:35 AM  Result Value Ref Range   Glucose-Capillary 172 (H) 65 - 99 mg/dL  CBC with Differential/Platelet     Status: Abnormal   Collection Time: 05/28/17 11:03 AM  Result Value Ref Range   WBC 7.6 4.0 - 10.5 K/uL   RBC 3.58 (L) 4.22 - 5.81 MIL/uL    Hemoglobin 11.9 (L) 13.0 - 17.0 g/dL   HCT 35.6 (L) 39.0 - 52.0 %   MCV 99.4 78.0 - 100.0 fL   MCH 33.2 26.0 - 34.0 pg   MCHC 33.4 30.0 - 36.0 g/dL   RDW 14.0 11.5 - 15.5 %   Platelets 190 150 - 400 K/uL   Neutrophils Relative % 71 %   Neutro Abs 5.4 1.7 - 7.7 K/uL   Lymphocytes Relative 20 %   Lymphs Abs 1.6 0.7 - 4.0 K/uL   Monocytes Relative 6 %   Monocytes Absolute 0.5 0.1 - 1.0 K/uL   Eosinophils Relative 3 %   Eosinophils Absolute 0.2 0.0 - 0.7 K/uL   Basophils Relative 0 %   Basophils Absolute 0.0 0.0 - 0.1 K/uL    Comment: Performed at [facility], [address], [place],  [address]  Basic metabolic panel     Status: Abnormal   Collection Time: 05/28/17 11:03 AM  Result Value Ref Range   Sodium 134 (L) 135 - 145 mmol/L   Potassium 5.4 (H) 3.5 - 5.1 mmol/L   Chloride 100 (L) 101 - 111 mmol/L   CO2 23 22 - 32 mmol/L  Glucose, Bld 205 (H) 65 - 99 mg/dL   BUN 25 (H) 6 - 20 mg/dL   Creatinine, Ser 1.24 0.61 - 1.24 mg/dL   Calcium 9.2 8.9 - 10.3 mg/dL   GFR calc non Af Amer 56 (L) >60 mL/min   GFR calc Af Amer >60 >60 mL/min    Comment: (NOTE) The eGFR has been calculated using the CKD EPI equation. This calculation has not been validated in all clinical situations. eGFR's persistently <60 mL/min signify possible Chronic Kidney Disease.    Anion gap 11 5 - 15    Comment: Performed at [facility] [address], [place], [place] [address]  Glucose, capillary     Status: Abnormal   Collection Time: 05/28/17 11:41 AM  Result Value Ref Range   Glucose-Capillary 189 (H) 65 - 99 mg/dL  Glucose, capillary     Status: Abnormal   Collection Time: 05/28/17  4:39 PM  Result Value Ref Range   Glucose-Capillary 145 (H) 65 - 99 mg/dL  Glucose, capillary     Status: Abnormal   Collection Time: 05/28/17  9:03 PM  Result Value Ref Range   Glucose-Capillary 187 (H) 65 - 99 mg/dL  Basic metabolic panel     Status: Abnormal   Collection Time: 05/29/17  5:32  AM  Result Value Ref Range   Sodium 137 135 - 145 mmol/L   Potassium 5.1 3.5 - 5.1 mmol/L   Chloride 100 (L) 101 - 111 mmol/L   CO2 26 22 - 32 mmol/L   Glucose, Bld 167 (H) 65 - 99 mg/dL   BUN 27 (H) 6 - 20 mg/dL   Creatinine, Ser 1.20 0.61 - 1.24 mg/dL   Calcium 9.2 8.9 - 10.3 mg/dL   GFR calc non Af Amer 58 (L) >60 mL/min   GFR calc Af Amer >60 >60 mL/min    Comment: (NOTE) The eGFR has been calculated using the CKD EPI equation. This calculation has not been validated in all clinical situations. eGFR's persistently <60 mL/min signify possible Chronic Kidney Disease.    Anion gap 11 5 - 15    Comment: Performed at [facility] [address] [place], [place] [address]  Glucose, capillary     Status: Abnormal   Collection Time: 05/29/17  6:33 AM  Result Value Ref Range   Glucose-Capillary 156 (H) 65 - 99 mg/dL    General: NAD. Well-developed.  HEENT: Normocephalic, atraumatic. Cardio: RRR and no JVD. Resp: CTA B/L and Unlabored GI: BS positive and ND Musc/Skel:  No edema, no tenderness. Neuro: Alert  Expressive >> Receptive aphasia Motor: LUE/LLE: Grossly 5/5 proximal to distal RUE/RLE: 0/5 proximal to distal (stable), increase tone UE and LE LUE tremor, unchanged Dysarthria Skin:   Intact. Warm and dry   Assessment/Plan: 1. Functional deficits secondary to left frontal parietal intraparenchymal hemorrhage with right hemiparesis, aphasia and cognitive deficits which require 3+ hours per day of interdisciplinary therapy in a comprehensive inpatient rehab setting. Physiatrist is providing close team supervision and 24 hour management of active medical problems listed below. Physiatrist and rehab team continue to assess barriers to discharge/monitor patient progress toward functional and medical goals. FIM: Function - Bathing Position: Wheelchair/chair at sink Body parts bathed by patient: Left arm, Chest, Abdomen, Front perineal area, Right upper leg, Left upper  leg, Right lower leg, Left lower leg Body parts bathed by helper: Buttocks, Back, Right arm Assist Level: Touching or steadying assistance(Pt > 75%)  Function- Upper Body Dressing/Undressing  What is the patient wearing?: Hospital gown Button up shirt - Perfomed by patient: Thread/unthread left sleeve Button up shirt - Perfomed by helper: Thread/unthread right sleeve, Pull shirt around back, Button/unbutton shirt Assist Level: (Max A) Function - Lower Body Dressing/Undressing What is the patient wearing?: Pants, Non-skid slipper socks, Ted Hose Position: Wheelchair/chair at [employer]- Performed by patient: Thread/unthread left pants leg Pants- Performed by helper: Pull pants up/down, Thread/unthread right pants leg Non-skid slipper socks- Performed by helper: Don/doff right sock, Don/doff left sock TED Hose - Performed by helper: Don/doff right TED hose, Don/doff left TED hose Assist for footwear: Dependant Assist for lower body dressing: (1 helper)  Function - Toileting Toileting activity did not occur: No continent bowel/bladder event Toileting steps completed by helper: Adjust clothing prior to toileting, Performs perineal hygiene, Adjust clothing after toileting Assist level: Two helpers(per Thea Alken, NT report)  Function - Air cabin crew transfer activity did not occur: (not attempted) Assist level to toilet: 2 helpers(per Thea Alken, NT report)  Function - Chair/bed transfer Chair/bed transfer method: Stand pivot Chair/bed transfer assist level: Moderate assist (Pt 50 - 74%/lift or lower) Chair/bed transfer assistive device: Armrests Chair/bed transfer details: Tactile cues for initiation, Tactile cues for sequencing, Tactile cues for weight shifting, Tactile cues for posture, Tactile cues for placement, Verbal cues for technique, Verbal cues for precautions/safety, Manual facilitation for weight shifting, Manual facilitation for placement  Function -  Locomotion: Wheelchair Type: Manual Wheelchair activity did not occur: Safety/medical concerns Max wheelchair distance: 100 ft Assist Level: Touching or steadying assistance (Pt > 75%) Wheel 50 feet with 2 turns activity did not occur: Safety/medical concerns Assist Level: Touching or steadying assistance (Pt > 75%) Wheel 150 feet activity did not occur: Safety/medical concerns Function - Locomotion: Ambulation Assistive device: Other (comment)(weighted grocery cart) Max distance: 10 Assist level: Moderate assist (Pt 50 - 74%) Walk 10 feet activity did not occur: Safety/medical concerns Assist level: Moderate assist (Pt 50 - 74%) Walk 50 feet with 2 turns activity did not occur: Safety/medical concerns Assist level: Moderate assist (Pt 50 - 74%) Walk 150 feet activity did not occur: Safety/medical concerns Walk 10 feet on uneven surfaces activity did not occur: Safety/medical concerns  Function - Comprehension Comprehension: Auditory Comprehension assist level: Understands basic 50 - 74% of the time/ requires cueing 25 - 49% of the time  Function - Expression Expression: Verbal Expression assist level: Expresses basic 25 - 49% of the time/requires cueing 50 - 75% of the time. Uses single words/gestures.  Function - Social Interaction Social Interaction assist level: Interacts appropriately 25 - 49% of time - Needs frequent redirection.  Function - Problem Solving Problem solving assist level: Solves basic 25 - 49% of the time - needs direction more than half the time to initiate, plan or complete simple activities  Function - Memory Memory assistive device: Other (Comment)(Calendar ) Memory assist level: Recognizes or recalls less than 25% of the time/requires cueing greater than 75% of the time Patient normally able to recall (first 3 days only): That he or she is in a hospital  Medical Problem List and Plan: 1.Decreased functional mobilitysecondary to left frontal  intraparenchymal hemorrhage with recent deep brain stimulator for tremor.    Cont CIR, discussed case with case manager   WHO/PRAFO in place 2. DVT Prophylaxis/Anticoagulation: SCDs. Monitor for any signs of DVT 3. Pain Management:Neurontin 300 mg 3 times a day, Tylenol as needed 4. Mood:Provide emotional support 5. Neuropsych: This patientisnot capable of making  decisions onhisown behalf.   limit neuro-sedating meds when possible   Will consider stimulant, however appears to be improving at present 6. Skin/Wound Care:Routine skin checks 7. Fluids/Electrolytes/Nutrition:Routine I&O's 8.Hypertension.    Cozaar 100 mg daily, decreased to 50 on 2/5   Lasix 20 mg daily d/ced on 2/5.   Propranolol 80 BID Vitals:   05/28/17 1959 05/29/17 0528  BP: (!) 148/115 (!) 113/52  Pulse: 66 (!) 59  Resp:  18  Temp:  97.9 F (36.6 C)  SpO2: 97% 95%    Relatively controlled on 2/12 9.CAD with stenting/CABG. No chest pain or shortness of breath 10.Hyperlipidemia. Lipitor,Lovaza 11. Tremors:   mysoline and inderal ongoing   simulator not operational yet, no plans to initiate in near future after discussion with Neuro 12.  Dysphagia   Cont D2 thins    Advance diet as tolerated   CXR reviewed, unremarkable for acute process 13.  Hypoalb- Prostat 14. Prediabetes   Diet change to cut modified, CBGs and SSI started on 2/6  Unable to start metformin due to renal function and ?ability to maintain PO fluids   Amaryl started on 2/12 15. ABLA   Hb 11.9 on 2/12   Cont to monitor 16. Confusion  Multifactorial due hypotension and sleep/wake changes and intracranial bleed/aphasia   CXR reviewed, unremarkable for acute changes   UA unremarkable, urine culture NG 17. Sleep disturbance with agitation  Trazodone 50 qhs scheduled on 2/5, changed to Seroquel 12.5 on 2/7, increased to 25 on 2/12   Melatonin started on 2/11   Will consider Risperdal PRN   Educated wife on environment  modifications 18. Spastic hemiplegia  Baclofen 5 TID started on 2/7  Cont ROM  Cont bracing 19. AKI  Cr 1.20 on 2/12  Encourage fluids  Will consider IVF if necessary 20.  Neurogenic bladder   Flomax increased to 0.8 on 2/11   Bethanechol started on 2/12   Continue to monitor  LOS (Days) 11 A FACE TO FACE EVALUATION WAS PERFORMED  Ankit Lorie Phenix 05/29/2017, 8:50 AM

## 2017-05-29 NOTE — Progress Notes (Signed)
Physical Therapy Note  Patient Details  Name: [name] W Hatchell MRN: [id number] Date of Birth: 01/03/1941 Today's Date: 05/29/2017    Time: 800-855 55 minutes  1:1 No c/o pain.  Pt performs supine to sit with mod A, mod cuing to turn to Rt.  Seated edge of bed with min A with max A for upper body dressing.  Max A to stand with total A to don pants, ted hose and shoes.  Stand pivot transfer with max A.  Gait training with RW with Rt hand splint 20', 30' with mod A for balance, cues for cadence to continue stepping, pt requires max A for turning to sit due to LOB.  Sit to stand repetitions with card matching task with mod A for sit to stand and for forward wt shift and pt tends to lose balance backwards in standing.  Step ups to 4'' step with RW with focus on forward wt shift with max A due to Rt LE weakness and posterior LOB.  W/c propulsion with bilat LEs for reciprocal movement and LE strengthening with min A and min multimodal cuing for technique.  Pt left in room with quick release belt donned, wife present.   DONAWERTH,KAREN 05/29/2017, 9:01 AM

## 2017-05-30 ENCOUNTER — Inpatient Hospital Stay (HOSPITAL_COMMUNITY): Payer: PPO | Admitting: Occupational Therapy

## 2017-05-30 ENCOUNTER — Encounter: Payer: Self-pay | Admitting: *Deleted

## 2017-05-30 ENCOUNTER — Inpatient Hospital Stay (HOSPITAL_COMMUNITY): Payer: PPO | Admitting: Physical Therapy

## 2017-05-30 ENCOUNTER — Encounter: Payer: Self-pay | Admitting: Psychology

## 2017-05-30 ENCOUNTER — Inpatient Hospital Stay (HOSPITAL_COMMUNITY): Payer: PPO | Admitting: Speech Pathology

## 2017-05-30 LAB — GLUCOSE, CAPILLARY
Glucose-Capillary: 139 mg/dL — ABNORMAL HIGH (ref 65–99)
Glucose-Capillary: 195 mg/dL — ABNORMAL HIGH (ref 65–99)
Glucose-Capillary: 114 mg/dL — ABNORMAL HIGH (ref 65–99)
Glucose-Capillary: 154 mg/dL — ABNORMAL HIGH (ref 65–99)

## 2017-05-30 MED ORDER — MELATONIN 3 MG PO TABS
3.0000 mg | ORAL_TABLET | Freq: Every day | ORAL | Status: DC
  Administered 2017-05-30 – 2017-06-08 (×10): 3 mg via ORAL
  Filled 2017-05-30 (×10): qty 1

## 2017-05-30 MED ORDER — RISPERIDONE 0.25 MG PO TABS
0.2500 mg | ORAL_TABLET | Freq: Every evening | ORAL | Status: DC | PRN
  Administered 2017-06-02 – 2017-06-04 (×3): 0.25 mg via ORAL
  Filled 2017-05-30 (×4): qty 1

## 2017-05-30 MED ORDER — QUETIAPINE FUMARATE 25 MG PO TABS
12.5000 mg | ORAL_TABLET | Freq: Every day | ORAL | Status: DC
  Administered 2017-05-30 – 2017-06-08 (×10): 12.5 mg via ORAL
  Filled 2017-05-30 (×10): qty 1

## 2017-05-30 NOTE — Progress Notes (Signed)
Subjective/Complaints: Patient seen sitting up in bed this morning. Daughter at bedside. She states the patient slept well overnight. Daughter with concerns about patient's tremors, discharge planning, bracing at night. Discussed applying bracing with no static.  ROS: Unable to assess due to aphasia.  Objective: Vital Signs: Blood pressure (!) 107/45, pulse (!) 58, temperature 98.4 F (36.9 C), temperature source Oral, resp. rate 16, height _0  (1.753 m), weight 92 kg (202 lb 12.8 oz), SpO2 97 %. No results found. Results for orders placed or performed during the hospital encounter of 05/18/17 (from the past 72 hour(s))  Glucose, capillary     Status: Abnormal   Collection Time: 05/27/17 11:55 AM  Result Value Ref Range   Glucose-Capillary 158 (H) 65 - 99 mg/dL  Glucose, capillary     Status: Abnormal   Collection Time: 05/27/17  4:37 PM  Result Value Ref Range   Glucose-Capillary 144 (H) 65 - 99 mg/dL  Glucose, capillary     Status: Abnormal   Collection Time: 05/27/17  9:19 PM  Result Value Ref Range   Glucose-Capillary 195 (H) 65 - 99 mg/dL  Glucose, capillary     Status: Abnormal   Collection Time: 05/28/17  6:35 AM  Result Value Ref Range   Glucose-Capillary 172 (H) 65 - 99 mg/dL  CBC with Differential/Platelet     Status: Abnormal   Collection Time: 05/28/17 11:03 AM  Result Value Ref Range   WBC 7.6 4.0 - 10.5 K/uL   RBC 3.58 (L) 4.22 - 5.81 MIL/uL   Hemoglobin 11.9 (L) 13.0 - 17.0 g/dL   HCT 35.6 (L) 39.0 - 52.0 %   MCV 99.4 78.0 - 100.0 fL   MCH 33.2 26.0 - 34.0 pg   MCHC 33.4 30.0 - 36.0 g/dL   RDW 14.0 11.5 - 15.5 %   Platelets 190 150 - 400 K/uL   Neutrophils Relative % 71 %   Neutro Abs 5.4 1.7 - 7.7 K/uL   Lymphocytes Relative 20 %   Lymphs Abs 1.6 0.7 - 4.0 K/uL   Monocytes Relative 6 %   Monocytes Absolute 0.5 0.1 - 1.0 K/uL   Eosinophils Relative 3 %   Eosinophils Absolute 0.2 0.0 - 0.7 K/uL   Basophils Relative 0 %   Basophils Absolute 0.0 0.0 -  0.1 K/uL    Comment: Performed at [facility], [address], [place], [place] [address]  Basic metabolic panel     Status: Abnormal   Collection Time: 05/28/17 11:03 AM  Result Value Ref Range   Sodium 134 (L) 135 - 145 mmol/L   Potassium 5.4 (H) 3.5 - 5.1 mmol/L   Chloride 100 (L) 101 - 111 mmol/L   CO2 23 22 - 32 mmol/L   Glucose, Bld 205 (H) 65 - 99 mg/dL   BUN 25 (H) 6 - 20 mg/dL   Creatinine, Ser 1.24 0.61 - 1.24 mg/dL   Calcium 9.2 8.9 - 10.3 mg/dL   GFR calc non Af Amer 56 (L) >60 mL/min   GFR calc Af Amer >60 >60 mL/min    Comment: (NOTE) The eGFR has been calculated using the CKD EPI equation. This calculation has not been validated in all clinical situations. eGFR's persistently <60 mL/min signify possible Chronic Kidney Disease.    Anion gap 11 5 - 15    Comment: Performed at [facility] [address] [place], [place] [address]  Glucose, capillary     Status: Abnormal  Collection Time: 05/28/17 11:41 AM  Result Value Ref Range   Glucose-Capillary 189 (H) 65 - 99 mg/dL  Glucose, capillary     Status: Abnormal   Collection Time: 05/28/17  4:39 PM  Result Value Ref Range   Glucose-Capillary 145 (H) 65 - 99 mg/dL  Glucose, capillary     Status: Abnormal   Collection Time: 05/28/17  9:03 PM  Result Value Ref Range   Glucose-Capillary 187 (H) 65 - 99 mg/dL  Basic metabolic panel     Status: Abnormal   Collection Time: 05/29/17  5:32 AM  Result Value Ref Range   Sodium 137 135 - 145 mmol/L   Potassium 5.1 3.5 - 5.1 mmol/L   Chloride 100 (L) 101 - 111 mmol/L   CO2 26 22 - 32 mmol/L   Glucose, Bld 167 (H) 65 - 99 mg/dL   BUN 27 (H) 6 - 20 mg/dL   Creatinine, Ser 1.20 0.61 - 1.24 mg/dL   Calcium 9.2 8.9 - 10.3 mg/dL   GFR calc non Af Amer 58 (L) >60 mL/min   GFR calc Af Amer >60 >60 mL/min    Comment: (NOTE) The eGFR has been calculated using the CKD EPI equation. This calculation has not been validated in all clinical situations. eGFR's  persistently <60 mL/min signify possible Chronic Kidney Disease.    Anion gap 11 5 - 15    Comment: Performed at [facility] [address], [place], [place] [address]  Glucose, capillary     Status: Abnormal   Collection Time: 05/29/17  6:33 AM  Result Value Ref Range   Glucose-Capillary 156 (H) 65 - 99 mg/dL  Glucose, capillary     Status: Abnormal   Collection Time: 05/29/17 11:20 AM  Result Value Ref Range   Glucose-Capillary 148 (H) 65 - 99 mg/dL  Glucose, capillary     Status: Abnormal   Collection Time: 05/29/17  4:34 PM  Result Value Ref Range   Glucose-Capillary 118 (H) 65 - 99 mg/dL  Glucose, capillary     Status: Abnormal   Collection Time: 05/29/17  8:55 PM  Result Value Ref Range   Glucose-Capillary 195 (H) 65 - 99 mg/dL   Comment 1 Notify RN   Glucose, capillary     Status: Abnormal   Collection Time: 05/30/17  6:39 AM  Result Value Ref Range   Glucose-Capillary 139 (H) 65 - 99 mg/dL   Comment 1 Notify RN     General: NAD. Well-developed.  HEENT: Normocephalic, atraumatic. Cardio: RRR and no JVD. Resp: CTA B/L and Unlabored GI: BS positive and ND Musc/Skel:  No edema, no tenderness. Neuro: Alert, fair basic spontaneous speech  Expressive >> Receptive aphasia Motor: LUE/LLE: Grossly 5/5 proximal to distal RUE/RLE: 0/5 proximal to distal (stable), increase tone UE and LE, stable LUE tremor Dysarthria Skin:   Intact. Warm and dry   Assessment/Plan: 1. Functional deficits secondary to left frontal parietal intraparenchymal hemorrhage with right hemiparesis, aphasia and cognitive deficits which require 3+ hours per day of interdisciplinary therapy in a comprehensive inpatient rehab setting. Physiatrist is providing close team supervision and 24 hour management of active medical problems listed below. Physiatrist and rehab team continue to assess barriers to discharge/monitor patient progress toward functional and medical goals. FIM: Function -  Bathing Position: Wheelchair/chair at sink Body parts bathed by patient: Left arm, Chest, Abdomen, Front perineal area, Right upper leg, Left upper leg, Right lower leg, Left lower leg Body parts bathed by helper: Buttocks,  Back, Right arm Assist Level: Touching or steadying assistance(Pt > 75%)  Function- Upper Body Dressing/Undressing What is the patient wearing?: Hospital gown Button up shirt - Perfomed by patient: Thread/unthread left sleeve Button up shirt - Perfomed by helper: Thread/unthread right sleeve, Pull shirt around back, Button/unbutton shirt Assist Level: (Max A) Function - Lower Body Dressing/Undressing What is the patient wearing?: Pants, Non-skid slipper socks, Ted Hose Position: Wheelchair/chair at [employer]- Performed by patient: Thread/unthread left pants leg Pants- Performed by helper: Pull pants up/down, Thread/unthread right pants leg Non-skid slipper socks- Performed by helper: Don/doff right sock, Don/doff left sock TED Hose - Performed by helper: Don/doff right TED hose, Don/doff left TED hose Assist for footwear: Dependant Assist for lower body dressing: (1 helper)  Function - Toileting Toileting activity did not occur: No continent bowel/bladder event Toileting steps completed by helper: Adjust clothing prior to toileting, Performs perineal hygiene, Adjust clothing after toileting Assist level: Two helpers(per Thea Alken, NT report)  Function - Air cabin crew transfer activity did not occur: (not attempted) Assist level to toilet: 2 helpers(per Thea Alken, NT report)  Function - Chair/bed transfer Chair/bed transfer method: Stand pivot Chair/bed transfer assist level: Moderate assist (Pt 50 - 74%/lift or lower) Chair/bed transfer assistive device: Armrests Chair/bed transfer details: Tactile cues for initiation, Tactile cues for sequencing, Tactile cues for weight shifting, Tactile cues for posture, Tactile cues for placement, Verbal  cues for technique, Verbal cues for precautions/safety, Manual facilitation for weight shifting, Manual facilitation for placement  Function - Locomotion: Wheelchair Type: Manual Wheelchair activity did not occur: Safety/medical concerns Max wheelchair distance: 100 ft Assist Level: Touching or steadying assistance (Pt > 75%) Wheel 50 feet with 2 turns activity did not occur: Safety/medical concerns Assist Level: Touching or steadying assistance (Pt > 75%) Wheel 150 feet activity did not occur: Safety/medical concerns Function - Locomotion: Ambulation Assistive device: Other (comment)(weighted grocery cart) Max distance: 10 Assist level: Moderate assist (Pt 50 - 74%) Walk 10 feet activity did not occur: Safety/medical concerns Assist level: Moderate assist (Pt 50 - 74%) Walk 50 feet with 2 turns activity did not occur: Safety/medical concerns Assist level: Moderate assist (Pt 50 - 74%) Walk 150 feet activity did not occur: Safety/medical concerns Walk 10 feet on uneven surfaces activity did not occur: Safety/medical concerns  Function - Comprehension Comprehension: Auditory Comprehension assist level: Understands basic 75 - 89% of the time/ requires cueing 10 - 24% of the time  Function - Expression Expression: Verbal Expression assist level: Expresses basic 50 - 74% of the time/requires cueing 25 - 49% of the time. Needs to repeat parts of sentences.  Function - Social Interaction Social Interaction assist level: Interacts appropriately 50 - 74% of the time - May be physically or verbally inappropriate.  Function - Problem Solving Problem solving assist level: Solves basic 25 - 49% of the time - needs direction more than half the time to initiate, plan or complete simple activities  Function - Memory Memory assistive device: Other (Comment)(Calendar ) Memory assist level: Recognizes or recalls 25 - 49% of the time/requires cueing 50 - 75% of the time Patient normally able to  recall (first 3 days only): That he or she is in a hospital  Medical Problem List and Plan: 1.Decreased functional mobilitysecondary to left frontal intraparenchymal hemorrhage with recent deep brain stimulator for tremor.    Cont CIR   WHO/PRAFO in place 2. DVT Prophylaxis/Anticoagulation: SCDs. Monitor for any signs of DVT 3. Pain Management:Neurontin 300 mg  3 times a day, Tylenol as needed 4. Mood:Provide emotional support 5. Neuropsych: This patientisnot capable of making decisions onhisown behalf.   limit neuro-sedating meds when possible   Will consider stimulant, however appears to be improving at present 6. Skin/Wound Care:Routine skin checks 7. Fluids/Electrolytes/Nutrition:Routine I&O's 8.Hypertension.    Cozaar 100 mg daily, decreased to 50 on 2/5   Lasix 20 mg daily d/ced on 2/5.   Propranolol 80 BID Vitals:   05/29/17 2034 05/30/17 0500  BP: (!) 100/52 (!) 107/45  Pulse: 66 (!) 58  Resp:  16  Temp:  98.4 F (36.9 C)  SpO2: 97% 97%    Relatively controlled on 2/13 9.CAD with stenting/CABG. No chest pain or shortness of breath 10.Hyperlipidemia. Lipitor,Lovaza 11. Tremors:   mysoline and inderal ongoing   simulator not operational yet, no plans to initiate in near future after discussion with Neuro 12.  Dysphagia   Cont D2 thins    Advance diet as tolerated   CXR reviewed, unremarkable for acute process 13.  Hypoalb- Prostat 14. Prediabetes   Diet change to cut modified, CBGs and SSI started on 2/6  Unable to start metformin due to renal function and ?ability to maintain PO fluids   Amaryl started on 2/12   Slightly improved on 2/13, will continue to monitor 15. ABLA   Hb 11.9 on 2/11   Cont to monitor 16. Confusion  Multifactorial due hypotension and sleep/wake changes and intracranial bleed/aphasia   CXR reviewed, unremarkable for acute changes   UA unremarkable, urine culture NG 17. Sleep disturbance with agitation  Trazodone 50 qhs  scheduled on 2/5, changed to Seroquel 12.5 on 2/7, increased to 25 on 2/12   Melatonin started on 2/11   Will consider Risperdal PRN   Improving   Educated wife on environment modifications 18. Spastic hemiplegia  Baclofen 5 TID started on 2/7  Cont ROM  Cont bracing 19. AKI  Cr 1.20 on 2/12  Encourage fluids  Will consider IVF if necessary 20.  Neurogenic bladder   Flomax increased to 0.8 on 2/11   Bethanechol started on 2/12   PVRs appear to be improving, will consider further medication increase tomorrow if necessary.   Continue to monitor  LOS (Days) 12 A FACE TO FACE EVALUATION WAS PERFORMED  Aryon Nham Lorie Phenix 05/30/2017, 8:31 AM

## 2017-05-30 NOTE — Consult Note (Addendum)
   [facility]   05/30/2017  [name] 09/16/1940 [id number]     [facility].  Went to bedside to speak with patient's daughter, Brendan DavenportSandra "[name]"  Waynetta [name], about [facility]. [name] is agreeable and written consent was obtained [facility] on patient's behalf. [name] has aphasia.  [name] states patient lives with his wife, Brendan [name]. [name] (daughter) lives in [place]. Discussed [facility] follow up services. [name] reports [name] needs a ramp at home. Endorses transportation is also an issue.  Discussed additional aide services with [facility] Care Providers. Made her aware will be contacting her regarding these services.   Explained in detail that [facility] or [facility]  will not interfere or replace services provided by home health.  Provided [facility] with contact information.  Will make referral to [facility] RNCM and [facility] LCSW for follow up upon discharge home.  [name] is currently on the inpatient rehab unit. Unsure of discharge date at this time.   Brendan [name] (daughter) [phone] Brendan PiedraMary Coey (wife cell) [phone]  Raiford NobleAtika Hall, MSN-Ed, RN,BSN [facility] Care [facility] Hospital Liaison [phone]

## 2017-05-30 NOTE — Patient Care Conference (Signed)
Inpatient RehabilitationTeam Conference and Plan of Care Update Date: 05/30/2017   Time: 2:40 PM    Patient Name: Brendan [name]      Medical Record Number: [id number]  Date of Birth: 04/28/1940 Sex: Male         Room/Bed: 4M06C/4M06C-01 Payor Info: Payor: [employer]na [employer]AGE / Plan: [employer] / Product Type: *No Product type* /    Admitting Diagnosis: L Frontal IPH  Admit Date/Time:  05/18/2017  3:49 PM Admission Comments: No comment available   Primary Diagnosis:  <principal problem not specified> Principal Problem: <principal problem not specified>  Patient Active Problem List   Diagnosis Date Noted  . Neurogenic bladder   . AKI (acute kidney injury) (HCC)   . Dizziness   . Hemiplegia (HCC)   . Sleep disturbance   . Confusion, postoperative   . Hypotension due to drugs   . Acute blood loss anemia   . Prediabetes   . Dysphagia   . Benign essential HTN   . Intraparenchymal hemorrhage of brain (HCC) 05/18/2017  . Tremors of nervous system 05/18/2017  . Right sided weakness   . Aphasia   . Intracerebral hemorrhage 05/16/2017  . Cerebral edema (HCC)   . Weakness 05/15/2017  . Coronary artery disease involving native coronary artery of native heart without angina pectoris   . Glaucoma   . Anxiety state   . Cognitive disorder   . Urinary retention   . Traumatic hemorrhage of left cerebrum without loss of consciousness (HCC)   . Pressure injury of skin 05/05/2017  . Seborrheic dermatitis of scalp 10/05/2016  . Tremor, essential 10/03/2016  . Cough 06/27/2016  . Rhinitis, allergic 01/12/2014  . CHRONIC STABLE ANGINA 08/06/2013  . Obesity (BMI 30-39.9) 01/30/2013  . Lower extremity edema 01/30/2013  . S/P CABG x 3   . Atherosclerotic heart disease of native coronary artery with angina pectoris (HCC)   . Hyperlipidemia   . Memory problem 07/01/2012  . RSD (reflex sympathetic dystrophy) 05/24/2012  . Pleural plaque with presence of asbestos 05/15/2012  .  Anemia, B12 deficiency 03/19/2012  . Atherosclerotic heart disease of artery bypass graft 09/16/2011  . BPH (benign prostatic hyperplasia) 08/08/2011  . Microscopic hematuria 02/27/2011  . Anxiety 10/03/2010  . Incontinence 07/21/2010  . ANGINA, STABLE 04/29/2008  . GERD 10/28/2007  . Essential tremor 08/30/2006  . Essential hypertension 07/06/2006    Expected Discharge Date: Expected Discharge Date: 06/09/17  Team Members Present: Physician leading conference: Dr. Maryla MorrowAnkit Patel Social Worker Present: Amada JupiterLucy Jabrea Kallstrom, LCSW Nurse Present: Tennis MustLuz Rosero, RN PT Present: Judieth KeensKaren Donaworth, PT OT Present: Rosalio LoudSarah Hoxie, OT SLP Present: Jackalyn LombardNicole Page, SLP PPS Coordinator present : Tora DuckMarie Noel, RN, CRRN     Current Status/Progress Goal Weekly Team Focus  Medical   Decreased functional mobility secondary to left frontal intraparenchymal hemorrhage with recent deep brain stimulator for tremor.  Improve mobility, safety, confusion, prediabtes, HTN, sleep disturbance, neurogenic bladder  See above   Bowel/Bladder   occasional incontinence in brief, however, usually I&O cath q 6-8 hours, with flomax and urocholine, volumes 350-53050ml, incontinent of bowel, LBM 2/12, colace bid  goals downgraded to total assist  monitor b/b q shift and prn, I&O cath q 6-8 hours, pt free of contstipation    Swallow/Nutrition/ Hydration   Dys 2, thin liquids; full supervision   supervision   continue working towards advancement of solids    ADL's   Mod A bathing, Max assist dressing, min-mod assist squat pivot transfers.  Focus on  Rt attention and arousal during treatment sessions.  Improved initiation with transfers and therapeutic activities 2/12  MinA overall   ADL retraining, functional transfers, NMR, cognitive remediation, family education   Mobility   fluctuates between min-max A depending on fatigue and time of day  min A  family ed, d/c planning   Communication   mod assist  min assist   naming, word finding,  expression of needs and wants to caregivers; family education   Safety/Cognition/ Behavioral Observations  mod-max assist   min assist   continue to address basic problem solving, visual scanning, attention; family education   Pain   rare s/s of pain, norco and tylenol prn  pain <3  assess pain q shift and prn   Skin   purple area to right buttock, barrier cream, skin improved rash with hypoallergenic sheets  monitor treat skin with total assist  assess skin q shift and prn    Rehab Goals Patient on target to meet rehab goals: Yes *See Care Plan and progress notes for long and short-term goals.     Barriers to Discharge  Current Status/Progress Possible Resolutions Date Resolved   Physician    Medical stability;Decreased caregiver support;Lack of/limited family support;Other (comments)  Cognition  See above  Therapies, adjust sleep/wake, optimize BP/CBG/bladder meds      Nursing                  PT                    OT                  SLP                SW                Discharge Planning/Teaching Needs:  Plan for pt to d/c home with wife who can provide only supervision;  daughter who can assist, however, short-term and local agency who can provide a couple of hours/day for ~ 30 days of CNA support.  Teaching to be ongoing.   Team Discussion:  MD has begun meds for pre-DM;  Adjustments made to meds to aide in sleep, however, pt much worse today with new agitation; flat affect and required much more assist.  MD aware and plans to adjust dosing again and to add risperidal.  Team with concerns about sleep hygiene as this has a direct, immediate effect on his functional abilities during the day.  Yesterday pt had done really well in therapies and was min assist with some ADLs.  The team does feel family should anticipate fluctuations in function at home regardless simply due to pt's medical issues and sensitivity to fatigue.  ST does note she was able to upgrade diet to D3, thin  today.  Need ongoing education with wife and daughter and will need to set up some home care schedule given caregiver physical limitations.  Revisions to Treatment Plan:  None    Continued Need for Acute Rehabilitation Level of Care: The patient requires daily medical management by a physician with specialized training in physical medicine and rehabilitation for the following conditions: Daily direction of a multidisciplinary physical rehabilitation program to ensure safe treatment while eliciting the highest outcome that is of practical value to the patient.: Yes Daily medical management of patient stability for increased activity during participation in an intensive rehabilitation regime.: Yes Daily analysis of laboratory values and/or radiology reports with any  subsequent need for medication adjustment of medical intervention for : Neurological problems;Post surgical problems;Diabetes problems;Blood pressure problems;Other  Marcile Fuquay 05/30/2017, 3:54 PM

## 2017-05-30 NOTE — Progress Notes (Signed)
Speech Language Pathology Daily Session Note  Patient Details  Name: Brendan [name] W Vaden MRN: [id number] Date of Birth: 03/04/1941  Today's Date: 05/30/2017 SLP Individual Time: 1340-1350 SLP Individual Time Calculation (min): 10 min  Short Term Goals: Week 2: SLP Short Term Goal 1 (Week 2): Pt will sustain attention to basic familiar task  for ~ 15 minutes with Mod A cues.  SLP Short Term Goal 2 (Week 2): Pt will convey immediate needs/wants to caregivers with mod assist verbal cues.   SLP Short Term Goal 3 (Week 2): Pt will increase speech intelligibility at the simple phrase level to achieve ~ 75% intelligibility given Mod A cues for use of speech intelligibility strategies.  SLP Short Term Goal 4 (Week 2): Pt will complete basic familiar problem solving tasks related to ADLs with Mod A cues.  SLP Short Term Goal 5 (Week 2): Pt will consume therapeutic trials of dys 3 textures with minimal overt s/s of aspiration and Min A cues for use of compensatory swallow strategies.  SLP Short Term Goal 5 - Progress (Week 2): Other (comment)(revised due to error)  Skilled Therapeutic Interventions:  Pt was seen for skilled ST targeting communication goals.  Pt was much clearer today in comparison to morning therapy session.  Pt was being transferred back to bed with assistance from OT.  He could convey that he wanted something to drink with min-mod assist question cues but could not verbally specify what drink he wanted.  Instead, he gestured towards the closet in his room where his daughter keeps Diet Dr. Reino KentPepper.  Pt very emphatically verified his selection stating "there you go."  Pt also smiled and laughed in response to humor.  Pt left in bed with bed alarm set and call bell within reach.  Continue per current plan of care.    Function:  Eating Eating                 Cognition Comprehension Comprehension assist level: Understands basic 75 - 89% of the time/ requires cueing 10 - 24% of the time   Expression   Expression assist level: Expresses basis less than 25% of the time/requires cueing >75% of the time.  Social Interaction Social Interaction assist level: Interacts appropriately 25 - 49% of time - Needs frequent redirection.  Problem Solving Problem solving assist level: Solves basic 25 - 49% of the time - needs direction more than half the time to initiate, plan or complete simple activities  Memory Memory assist level: Recognizes or recalls less than 25% of the time/requires cueing greater than 75% of the time    Pain Pain Assessment Pain Assessment: No/denies pain  Therapy/Group: Individual Therapy  Jakolby Sedivy, Melanee SpryNicole L 05/30/2017, 2:49 PM

## 2017-05-30 NOTE — Progress Notes (Signed)
Physical Therapy Note  Patient Details  Name: Brendan [name] MRN: [id number] Date of Birth: 10/05/1940 Today's Date: 05/30/2017    Time: [id number] 55 minutes  1:1 No c/o pain. Pt more drowsy this session and requires cues to hold head up and keep eyes open during session.  Attempt standing and gait training with RW, pt max A to stand due to Rt lean, unable to sequence and take steps with RW despite max A and max cuing.   Sit to stand x 5 with max A, pt unable to extend trunk, increasingly forward flexed posture.  Pt requires min A to maintain sitting balance due to Rt lean.  Max A transfer to nustep with pt able to perform x 6 minutes level 3.  W/c mobility with bilat LEs with min/mod A, cues for initiation and attention to task.   [name] 05/30/2017, 10:29 AM

## 2017-05-30 NOTE — Progress Notes (Signed)
Occupational Therapy Session Note  Patient Details  Name: Brendan [name] MRN: [id number] Date of Birth: 03/24/1941  Today's Date: 05/30/2017 OT Individual Time: [id number] OT Individual Time Calculation (min): 30 min    Short Term Goals: Week 2:  OT Short Term Goal 1 (Week 2): Pt will complete toilet transfer with Mod A 1 helper OT Short Term Goal 2 (Week 2): Pt will scan to the Rt for 1 ADL item with mod vcs OT Short Term Goal 3 (Week 2): Pt will complete UB dressing with Mod assist OT Short Term Goal 4 (Week 2): Pt will complete bathing with mod assist and min cues for initiation/sequencing  Skilled Therapeutic Interventions/Progress Updates:    Treatment session with focus on RUE NMR with PROM and self-ROM.  Issued pt self-ROM handouts with focus on shoulder flexion, elbow flexion/extension, and wrist flexion/extension and ulnar/radial deviation.  Pt required multimodal cues and hand over hand to complete each exercise with proper technique.  Engaged in discussion with pt and pt's daughter regarding PROM and self-ROM as well as positioning of RUE to increase awareness of UE as well as promote visual scanning to midline and eventually to Rt side of body and environment.  Pt returned to bed at end of session via squat pivot to Rt with max assist.  Pt demonstrating increased initiation with activities this session, continuing to require frequent cues for sustained attention and active participation with exercises and mobility.    Therapy Documentation Precautions:  Precautions Precautions: Fall Precaution Comments: Lt gaze preference, Rt hemi, aphasic, R inattention Restrictions Weight Bearing Restrictions: No Pain: Pain Assessment Pain Assessment: No/denies pain  See Function Navigator for Current Functional Status.   Therapy/Group: Individual Therapy  Rosalio LoudHOXIE, Kari Montero 05/30/2017, 2:39 PM

## 2017-05-30 NOTE — Progress Notes (Signed)
Occupational Therapy Session Note  Patient Details  Name: Brendan [name] MRN: [id number] Date of Birth: 09/05/1940  Today's Date: 05/30/2017 OT Individual Time: [id number] OT Individual Time Calculation (min): 55 min    Short Term Goals: Week 2:  OT Short Term Goal 1 (Week 2): Pt will complete toilet transfer with Mod A 1 helper OT Short Term Goal 2 (Week 2): Pt will scan to the Rt for 1 ADL item with mod vcs OT Short Term Goal 3 (Week 2): Pt will complete UB dressing with Mod assist OT Short Term Goal 4 (Week 2): Pt will complete bathing with mod assist and min cues for initiation/sequencing  Skilled Therapeutic Interventions/Progress Updates:    Treatment session with focus on beginning family education with daughter.  Pt received supine in bed, reporting "tired".  Engaged in bed mobility with focus on hand placement for both pt and caregiver to increase pt participation.  Noted increased Rt lean and disengagement during mobility and self-care tasks.  Completed squat pivot transfer with max assist and 2nd person present for safety due to decreased initiation due to ?lethargy from increased dose of seroquel.  Engaged in bathing and dressing at sit > stand level at sink with mod cues for sequencing and hand over hand assist to utilize RUE when washing Lt arm.  Pt demonstrating good sequencing with UB bathing, requiring increased cues for LB bathing.  +2 assist for LB bathing and dressing in standing due to increased Rt lean and withdrawn demeanor.  Discussed with daughter recommendation for bed level for bathing/dressing when pt at this level.  Discussed fluctuations due to sleep and medications.   Pt with increased tone, decreased arousal, and increased Rt lean in sitting and standing this session.  Required +2 for LB bathing and dressing at sit > stand level due to decreased participation ?from increased dose of seroquel yesterday.  Therapy Documentation Precautions:  Precautions Precautions:  Fall Precaution Comments: Lt gaze preference, Rt hemi, aphasic, R inattention Restrictions Weight Bearing Restrictions: No Pain:  Pt with no c/o pain  See Function Navigator for Current Functional Status.   Therapy/Group: Individual Therapy  Rosalio LoudHOXIE, [name] 05/30/2017, 9:54 AM

## 2017-05-30 NOTE — Progress Notes (Signed)
Speech Language Pathology Daily Session Note  Patient Details  Name: Brendan [name] MRN: [id number] Date of Birth: 05/29/1940  Today's Date: 05/30/2017 SLP Individual Time: 1105-1200 SLP Individual Time Calculation (min): 55 min  Short Term Goals: Week 2: SLP Short Term Goal 1 (Week 2): Pt will sustain attention to basic familiar task  for ~ 15 minutes with Mod A cues.  SLP Short Term Goal 2 (Week 2): Pt will convey immediate needs/wants to caregivers with mod assist verbal cues.   SLP Short Term Goal 3 (Week 2): Pt will increase speech intelligibility at the simple phrase level to achieve ~ 75% intelligibility given Mod A cues for use of speech intelligibility strategies.  SLP Short Term Goal 4 (Week 2): Pt will complete basic familiar problem solving tasks related to ADLs with Mod A cues.  SLP Short Term Goal 5 (Week 2): Pt will consume therapeutic trials of dys 3 textures with minimal overt s/s of aspiration and Min A cues for use of compensatory swallow strategies.  SLP Short Term Goal 5 - Progress (Week 2): Other (comment)(revised due to error)  Skilled Therapeutic Interventions:   Pt was seen for skilled ST targeting cognitive goals.  Per report, Seroquel dosage was doubled last night to improve pt sleep.  Pt reportedly slept well last night but was very lethargic and disengaged throughout therapy session.  Pt essentially refused to participate in language tasks by acting withdrawn, turning head away from therapist and avoiding eye contact, but was agreeable to participating in a basic, familiar card game.  Pt needed mod assist verbal and visual cues to maintain turn taking throughout task due to decreased sustained attention.  SLP provided skilled education to pt's daughter regarding strategies for communication and cognition that were reviewed with pt's wife yesterday.  A handout was provided to facilitate carryover in the home environment.  Pt was transferred back to bed with assistance  from daughter.  During transfer, pt swatted at daughter stating "just go away."  Episode passed as quickly as it began with minimal gentle redirection.  Discussed techniques to de-escalate during moments of agitation.  Pt was left resting comfortably in bed with daughter at bedside and bed alarm set.  Discussed concerns regarding medications with PA and will also bring them up in team conference with MD this afternoon.  Continue per current plan of care  Function:  Eating Eating                 Cognition Comprehension Comprehension assist level: Understands basic 75 - 89% of the time/ requires cueing 10 - 24% of the time  Expression   Expression assist level: Expresses basis less than 25% of the time/requires cueing >75% of the time.  Social Interaction Social Interaction assist level: Interacts appropriately 25 - 49% of time - Needs frequent redirection.  Problem Solving Problem solving assist level: Solves basic 25 - 49% of the time - needs direction more than half the time to initiate, plan or complete simple activities  Memory Memory assist level: Recognizes or recalls less than 25% of the time/requires cueing greater than 75% of the time    Pain Pain Assessment Pain Assessment: No/denies pain  Therapy/Group: Individual Therapy  Yonatan Guitron, Melanee SpryNicole L 05/30/2017, 12:26 PM

## 2017-05-30 NOTE — Plan of Care (Signed)
  Consults [facility] STROKE PATIENT EDUCATION Description See Patient Education module for education specifics  05/30/2017 0455 - Progressing by Mason JimKooy, Suzy Kugel, RN Nutrition Consult-if indicated 05/30/2017 0455 - Progressing by Mason JimKooy, Gloris Shiroma, RN Diabetes Guidelines if Diabetic/Glucose > 140 Description If diabetic or lab glucose is > 140 mg/dl - Initiate Diabetes/Hyperglycemia Guidelines & Document Interventions  05/30/2017 0455 - Progressing by Mason JimKooy, Edi Gorniak, RN with total assist

## 2017-05-31 ENCOUNTER — Inpatient Hospital Stay (HOSPITAL_COMMUNITY): Payer: PPO | Admitting: Speech Pathology

## 2017-05-31 ENCOUNTER — Inpatient Hospital Stay (HOSPITAL_COMMUNITY): Payer: PPO | Admitting: Occupational Therapy

## 2017-05-31 ENCOUNTER — Inpatient Hospital Stay (HOSPITAL_COMMUNITY): Payer: PPO

## 2017-05-31 DIAGNOSIS — E861 Hypovolemia: Secondary | ICD-10-CM

## 2017-05-31 DIAGNOSIS — I9589 Other hypotension: Secondary | ICD-10-CM

## 2017-05-31 DIAGNOSIS — Z951 Presence of aortocoronary bypass graft: Secondary | ICD-10-CM

## 2017-05-31 LAB — BASIC METABOLIC PANEL
Anion gap: 12 (ref 5–15)
BUN: 35 mg/dL — ABNORMAL HIGH (ref 6–20)
CO2: 24 mmol/L (ref 22–32)
Calcium: 9 mg/dL (ref 8.9–10.3)
Chloride: 101 mmol/L (ref 101–111)
Creatinine, Ser: 1.76 mg/dL — ABNORMAL HIGH (ref 0.61–1.24)
GFR calc Af Amer: 42 mL/min — ABNORMAL LOW (ref >60)
GFR calc non Af Amer: 36 mL/min — ABNORMAL LOW (ref >60)
Glucose, Bld: 129 mg/dL — ABNORMAL HIGH (ref 65–99)
Potassium: 5.2 mmol/L — ABNORMAL HIGH (ref 3.5–5.1)
Sodium: 137 mmol/L (ref 135–145)

## 2017-05-31 LAB — GLUCOSE, CAPILLARY
Glucose-Capillary: 116 mg/dL — ABNORMAL HIGH (ref 65–99)
Glucose-Capillary: 133 mg/dL — ABNORMAL HIGH (ref 65–99)
Glucose-Capillary: 105 mg/dL — ABNORMAL HIGH (ref 65–99)
Glucose-Capillary: 159 mg/dL — ABNORMAL HIGH (ref 65–99)

## 2017-05-31 MED ORDER — SODIUM CHLORIDE 0.9% FLUSH
10.0000 mL | INTRAVENOUS | Status: DC | PRN
  Administered 2017-06-05: 10 mL
  Filled 2017-05-31: qty 40

## 2017-05-31 MED ORDER — SODIUM CHLORIDE 0.45 % IV SOLN
INTRAVENOUS | Status: DC
  Administered 2017-05-31: 07:00:00 via INTRAVENOUS

## 2017-05-31 MED ORDER — SODIUM CHLORIDE 0.9 % IV SOLN
INTRAVENOUS | Status: DC
  Administered 2017-05-31 (×2): via INTRAVENOUS

## 2017-05-31 NOTE — Progress Notes (Signed)
Subjective/Complaints: Patient seen lying in bed this morning. Overnight nursing with concerns regarding blood pressure, lethargy, urine output. However, patient's wife and patient denied concerns. Per patient and wife patient slept well overnight. Also discussed case with PA. IVF initiated.  ROS: Unable to assess due to aphasia.  Objective: Vital Signs: Blood pressure (!) 88/46, pulse (!) 58, temperature 98.1 F (36.7 C), temperature source Oral, resp. rate 17, height '5\' 9"'$  (1.753 m), weight 83.9 kg (184 lb 15.5 oz), SpO2 98 %. No results found. Results for orders placed or performed during the hospital encounter of 05/18/17 (from the past 72 hour(s))  CBC with Differential/Platelet     Status: Abnormal   Collection Time: 05/28/17 11:03 AM  Result Value Ref Range   WBC 7.6 4.0 - 10.5 K/uL   RBC 3.58 (L) 4.22 - 5.81 MIL/uL   Hemoglobin 11.9 (L) 13.0 - 17.0 g/dL   HCT 35.6 (L) 39.0 - 52.0 %   MCV 99.4 78.0 - 100.0 fL   MCH 33.2 26.0 - 34.0 pg   MCHC 33.4 30.0 - 36.0 g/dL   RDW 14.0 11.5 - 15.5 %   Platelets 190 150 - 400 K/uL   Neutrophils Relative % 71 %   Neutro Abs 5.4 1.7 - 7.7 K/uL   Lymphocytes Relative 20 %   Lymphs Abs 1.6 0.7 - 4.0 K/uL   Monocytes Relative 6 %   Monocytes Absolute 0.5 0.1 - 1.0 K/uL   Eosinophils Relative 3 %   Eosinophils Absolute 0.2 0.0 - 0.7 K/uL   Basophils Relative 0 %   Basophils Absolute 0.0 0.0 - 0.1 K/uL    Comment: Performed at [facility], [address], [place], [place] [address]  Basic metabolic panel     Status: Abnormal   Collection Time: 05/28/17 11:03 AM  Result Value Ref Range   Sodium 134 (L) 135 - 145 mmol/L   Potassium 5.4 (H) 3.5 - 5.1 mmol/L   Chloride 100 (L) 101 - 111 mmol/L   CO2 23 22 - 32 mmol/L   Glucose, Bld 205 (H) 65 - 99 mg/dL   BUN 25 (H) 6 - 20 mg/dL   Creatinine, Ser 1.24 0.61 - 1.24 mg/dL   Calcium 9.2 8.9 - 10.3 mg/dL   GFR calc non Af Amer 56 (L) >60 mL/min   GFR calc Af Amer >60 >60 mL/min     Comment: (NOTE) The eGFR has been calculated using the CKD EPI equation. This calculation has not been validated in all clinical situations. eGFR's persistently <60 mL/min signify possible Chronic Kidney Disease.    Anion gap 11 5 - 15    Comment: Performed at [facility] [address], [place], [place] [address]  Glucose, capillary     Status: Abnormal   Collection Time: 05/28/17 11:41 AM  Result Value Ref Range   Glucose-Capillary 189 (H) 65 - 99 mg/dL  Glucose, capillary     Status: Abnormal   Collection Time: 05/28/17  4:39 PM  Result Value Ref Range   Glucose-Capillary 145 (H) 65 - 99 mg/dL  Glucose, capillary     Status: Abnormal   Collection Time: 05/28/17  9:03 PM  Result Value Ref Range   Glucose-Capillary 187 (H) 65 - 99 mg/dL  Basic metabolic panel     Status: Abnormal   Collection Time: 05/29/17  5:32 AM  Result Value Ref Range   Sodium 137 135 - 145 mmol/L   Potassium 5.1 3.5 - 5.1  mmol/L   Chloride 100 (L) 101 - 111 mmol/L   CO2 26 22 - 32 mmol/L   Glucose, Bld 167 (H) 65 - 99 mg/dL   BUN 27 (H) 6 - 20 mg/dL   Creatinine, Ser 1.20 0.61 - 1.24 mg/dL   Calcium 9.2 8.9 - 10.3 mg/dL   GFR calc non Af Amer 58 (L) >60 mL/min   GFR calc Af Amer >60 >60 mL/min    Comment: (NOTE) The eGFR has been calculated using the CKD EPI equation. This calculation has not been validated in all clinical situations. eGFR's persistently <60 mL/min signify possible Chronic Kidney Disease.    Anion gap 11 5 - 15    Comment: Performed at [facility] [address] [place], [place] [address]  Glucose, capillary     Status: Abnormal   Collection Time: 05/29/17  6:33 AM  Result Value Ref Range   Glucose-Capillary 156 (H) 65 - 99 mg/dL  Glucose, capillary     Status: Abnormal   Collection Time: 05/29/17 11:20 AM  Result Value Ref Range   Glucose-Capillary 148 (H) 65 - 99 mg/dL  Glucose, capillary     Status: Abnormal   Collection Time: 05/29/17  4:34 PM  Result  Value Ref Range   Glucose-Capillary 118 (H) 65 - 99 mg/dL  Glucose, capillary     Status: Abnormal   Collection Time: 05/29/17  8:55 PM  Result Value Ref Range   Glucose-Capillary 195 (H) 65 - 99 mg/dL   Comment 1 Notify RN   Glucose, capillary     Status: Abnormal   Collection Time: 05/30/17  6:39 AM  Result Value Ref Range   Glucose-Capillary 139 (H) 65 - 99 mg/dL   Comment 1 Notify RN   Glucose, capillary     Status: Abnormal   Collection Time: 05/30/17 12:00 PM  Result Value Ref Range   Glucose-Capillary 195 (H) 65 - 99 mg/dL  Glucose, capillary     Status: Abnormal   Collection Time: 05/30/17  4:57 PM  Result Value Ref Range   Glucose-Capillary 114 (H) 65 - 99 mg/dL  Glucose, capillary     Status: Abnormal   Collection Time: 05/30/17  9:00 PM  Result Value Ref Range   Glucose-Capillary 154 (H) 65 - 99 mg/dL   Comment 1 Notify RN   Basic metabolic panel     Status: Abnormal   Collection Time: 05/31/17  6:25 AM  Result Value Ref Range   Sodium 137 135 - 145 mmol/L   Potassium 5.2 (H) 3.5 - 5.1 mmol/L   Chloride 101 101 - 111 mmol/L   CO2 24 22 - 32 mmol/L   Glucose, Bld 129 (H) 65 - 99 mg/dL   BUN 35 (H) 6 - 20 mg/dL   Creatinine, Ser 1.76 (H) 0.61 - 1.24 mg/dL   Calcium 9.0 8.9 - 10.3 mg/dL   GFR calc non Af Amer 36 (L) >60 mL/min   GFR calc Af Amer 42 (L) >60 mL/min    Comment: (NOTE) The eGFR has been calculated using the CKD EPI equation. This calculation has not been validated in all clinical situations. eGFR's persistently <60 mL/min signify possible Chronic Kidney Disease.    Anion gap 12 5 - 15    Comment: Performed at [facility] [address] [place], [place] [address]  Glucose, capillary     Status: Abnormal   Collection Time: 05/31/17  6:59 AM  Result Value Ref Range  Glucose-Capillary 116 (H) 65 - 99 mg/dL   Comment 1 Notify RN     General: NAD. Well-developed.  HEENT: Normocephalic, atraumatic. Cardio: RRR and no JVD. Resp: CTA B/L  and Unlabored GI: BS positive and ND Musc/Skel:  No edema, no tenderness. Neuro: Alert, fair basic spontaneous speech  Expressive >> Receptive aphasia Motor:  RUE/RLE: 0/5 proximal to distal (stable), increase tone UE and LE (unchanged) LUE tremor Dysarthria Skin:   Intact. Warm and dry   Assessment/Plan: 1. Functional deficits secondary to left frontal parietal intraparenchymal hemorrhage with right hemiparesis, aphasia and cognitive deficits which require 3+ hours per day of interdisciplinary therapy in a comprehensive inpatient rehab setting. Physiatrist is providing close team supervision and 24 hour management of active medical problems listed below. Physiatrist and rehab team continue to assess barriers to discharge/monitor patient progress toward functional and medical goals. FIM: Function - Bathing Position: Wheelchair/chair at sink Body parts bathed by patient: Left arm, Chest, Abdomen, Right upper leg, Left upper leg Body parts bathed by helper: Buttocks, Back, Right arm, Front perineal area Bathing not applicable: Left lower leg, Right lower leg Assist Level: 2 helpers(sit > stand level)  Function- Upper Body Dressing/Undressing What is the patient wearing?: Button up shirt Button up shirt - Perfomed by patient: Thread/unthread left sleeve Button up shirt - Perfomed by helper: Thread/unthread right sleeve, Pull shirt around back, Button/unbutton shirt Assist Level: (Max assist) Function - Lower Body Dressing/Undressing What is the patient wearing?: Pants, Non-skid slipper socks Position: Wheelchair/chair at sink Pants- Performed by patient: Thread/unthread left pants leg Pants- Performed by helper: Thread/unthread right pants leg, Pull pants up/down Non-skid slipper socks- Performed by helper: Don/doff right sock, Don/doff left sock TED Hose - Performed by helper: Don/doff right TED hose, Don/doff left TED hose Assist for footwear: Dependant Assist for lower body  dressing: 2 Helpers(sit > stand)  Function - Toileting Toileting activity did not occur: No continent bowel/bladder event Toileting steps completed by helper: Adjust clothing prior to toileting, Performs perineal hygiene, Adjust clothing after toileting Assist level: Two helpers(per Thea Alken, NT report)  Function - Air cabin crew transfer activity did not occur: (not attempted) Assist level to toilet: 2 helpers(per Thea Alken, NT report)  Function - Chair/bed transfer Chair/bed transfer method: Squat pivot Chair/bed transfer assist level: Maximal assist (Pt 25 - 49%/lift and lower) Chair/bed transfer assistive device: Armrests Chair/bed transfer details: Tactile cues for initiation, Tactile cues for sequencing, Tactile cues for weight shifting, Tactile cues for posture, Tactile cues for placement, Verbal cues for technique, Verbal cues for precautions/safety, Manual facilitation for weight shifting, Manual facilitation for placement  Function - Locomotion: Wheelchair Type: Manual Wheelchair activity did not occur: Safety/medical concerns Max wheelchair distance: 100 ft Assist Level: Touching or steadying assistance (Pt > 75%) Wheel 50 feet with 2 turns activity did not occur: Safety/medical concerns Assist Level: Touching or steadying assistance (Pt > 75%) Wheel 150 feet activity did not occur: Safety/medical concerns Function - Locomotion: Ambulation Assistive device: Other (comment)(weighted grocery cart) Max distance: 10 Assist level: Moderate assist (Pt 50 - 74%) Walk 10 feet activity did not occur: Safety/medical concerns Assist level: Moderate assist (Pt 50 - 74%) Walk 50 feet with 2 turns activity did not occur: Safety/medical concerns Assist level: Moderate assist (Pt 50 - 74%) Walk 150 feet activity did not occur: Safety/medical concerns Walk 10 feet on uneven surfaces activity did not occur: Safety/medical concerns  Function -  Comprehension Comprehension: Auditory Comprehension assist level: Understands basic 75 -  89% of the time/ requires cueing 10 - 24% of the time  Function - Expression Expression: Verbal Expression assist level: Expresses basis less than 25% of the time/requires cueing >75% of the time.  Function - Social Interaction Social Interaction assist level: Interacts appropriately 25 - 49% of time - Needs frequent redirection.  Function - Problem Solving Problem solving assist level: Solves basic 25 - 49% of the time - needs direction more than half the time to initiate, plan or complete simple activities  Function - Memory Memory assistive device: Other (Comment)(Calendar ) Memory assist level: Recognizes or recalls less than 25% of the time/requires cueing greater than 75% of the time Patient normally able to recall (first 3 days only): That he or she is in a hospital  Medical Problem List and Plan: 1.Decreased functional mobilitysecondary to left frontal intraparenchymal hemorrhage with recent deep brain stimulator for tremor.    Cont CIR   WHO/PRAFO in place 2. DVT Prophylaxis/Anticoagulation: SCDs. Monitor for any signs of DVT 3. Pain Management:Neurontin 300 mg 3 times a day, Tylenol as needed 4. Mood:Provide emotional support 5. Neuropsych: This patientisnot capable of making decisions onhisown behalf.   limit neuro-sedating meds when possible   Will consider stimulant if necessary 6. Skin/Wound Care:Routine skin checks 7. Fluids/Electrolytes/Nutrition:Routine I&O's 8.Hypertension.    Cozaar 100 mg daily, decreased to 50 on 2/5   Lasix 20 mg daily d/ced on 2/5.   Propranolol 80 BID, will consider decreasing Vitals:   05/31/17 0247 05/31/17 0321  BP: (!) 83/48 (!) 88/46  Pulse: (!) 58   Resp: 17   Temp: 98.1 F (36.7 C)   SpO2:      Hypotensive on 2/14   IVF started, changed on 2/14 9.CAD with stenting/CABG. No chest pain or shortness of  breath 10.Hyperlipidemia. Lipitor,Lovaza 11. Tremors:   mysoline and inderal ongoing   simulator not operational yet, no plans to initiate in near future after discussion with Neuro 12.  Dysphagia   Advanced to D3 thins    Advance diet as tolerated   CXR reviewed, unremarkable for acute process 13.  Hypoalb- Prostat 14. Prediabetes   Diet change to carb modified, CBGs and SSI started on 2/6  Unable to start metformin due to renal function and ?ability to maintain PO fluids   Amaryl started on 2/12   Will consider medication increase tomorrow 15. ABLA   Hb 11.9 on 2/11   Cont to monitor 16. Confusion  Multifactorial due hypotension and sleep/wake changes and intracranial bleed/aphasia   CXR reviewed, unremarkable for acute changes   UA unremarkable, urine culture NG 17. Sleep disturbance with agitation  Trazodone 50 qhs scheduled on 2/5, changed to Seroquel 12.5 on 2/7, increased to 25 on 2/12   Melatonin started on 2/11   Will consider Risperdal PRN   Improving, but labile at times   Educated wife on environment modifications 18. Spastic hemiplegia  Baclofen 5 TID started on 2/7  Cont ROM  Cont bracing 19. AKI  Cr 1.76 on 2/14   Labs ordered for tomorrow  Encourage fluids  IVF started on 2/14 20.  Neurogenic bladder   Flomax increased to 0.8 on 2/11   Bethanechol started on 2/12   PVRs appear to be improving   Continue to monitor  LOS (Days) 13 A FACE TO FACE EVALUATION WAS PERFORMED  Earnie Rockhold Lorie Phenix 05/31/2017, 8:21 AM

## 2017-05-31 NOTE — Progress Notes (Signed)
This morning, nurse tech alerted that patients blood pressure was low. Blood pressure was rechecked & confirmed. Patient was very sleepy. He was easily aroused but did not stay awake. Fluids were encouraged, but could only get him to take a few sips. No signs & symptoms noted. He had also not urinated in 8 hrs. The last scan was very low at 6 hrs, he was scanned again at 8hr & it was still very low. Patient was catheterized, but met with resistance. A coude catheter was used, it passed but only 60 ml was released. His prior scan was for 109. On call was called at approximately 5:15am & was told to recheck in 30 minutes & to consult his physician assistant when he comes in. Physician assistant came in about 15 minutes later & ordered IV 0.45% of normal saline at 34m/hr, and some labs. IV team came, started the IV & drew the labs. His IV fluids are running, no acute distress noted. Wife is at bedside. Dr. PPosey Pronto & oncoming nurses informed.

## 2017-05-31 NOTE — Progress Notes (Signed)
Physical Therapy Session Note  Patient Details  Name: Brendan [name] MRN: [id number] Date of Birth: 09/27/1940  Today's Date: 05/31/2017 PT Individual Time: [id number] PT Individual Time Calculation (min): 75 min   Short Term Goals: Week 2:  PT Short Term Goal 1 (Week 2): Pt will increase bed mobility to Mod A PT Short Term Goal 2 (Week 2): Pt will increase transfers to Mod A PT Short Term Goal 3 (Week 2): Pt will ambulate with LRAD about 25 feet with mod A.  PT Short Term Goal 4 (Week 2): Pt will ascend/descend 4 stairs with B rails and max A.  PT Short Term Goal 5 (Week 2): Pt will propel w/c x 50 ft with Min A  Skilled Therapeutic Interventions/Progress Updates:    Patient in supine wife present.  Reports now getting IV fluids, but still not urinating much.  States pt sleeps until later in the day at home.  Questioning about d/c plans.  Patient supine to don pants with total assist as well as TED's.  Supine to sit mod A for trunk, able to move R leg with increased time and guidance, but assist to lift trunk upright.  Wife in session participating to assist pt as well with sitting balance. Supine to sit with cues for technique.  Patient worked on sitting balance at EOB max cues for head upright, min to mod A at times for balance due to anterior lean, posterior lean, R lateral lean.  Patient transferred to w/c mod to max A cues for technique via squat pivot to R.  Max cues for repositioning in w/c.  Attempted to engage pt in w/c mobility, but was unsafe due to placement of L UE IV site.  Pushed to therapy gym and pt sit to stand to tall table with checker board.  Mod to max A for balance due to leaning to R and max cues for head up, tall posture, R side awareness pt moving checkers in no specific pattern on board taking turns with his wife and only focused on L side of board.  Two standing trials about 1-2 minutes each.  Better upright with blocking R knee, but pt complains of pain.  Gait about 2-3'  with RW max A and pt max cues for L lateral weight shift to allow R foot progression.  Sat on PT knee and had to bring w/c to pt.  Patient c/o neck pain so applied moist head to cervical spine x 10 min.  Also PROM R UE and LE prior to OOB.  Left in w/c with wife in room.   Therapy Documentation Precautions:  Precautions Precautions: Fall Precaution Comments: Lt gaze preference, Rt hemi, aphasic, R inattention Restrictions Weight Bearing Restrictions: No Pain:  Pain Assessment Pain Assessment: 0-10 Pain Score: 10-Worst pain ever Pain Type: Chronic pain Pain Location: Neck Pain Orientation: Posterior Pain Descriptors / Indicators: Aching Pain Frequency: Intermittent Pain Onset: On-going Patients Stated Pain Goal: 2 Pain Intervention(s): RN made aware;Repositioned;Heat applied Multiple Pain Sites: No   See Function Navigator for Current Functional Status.   Therapy/Group: Individual Therapy  Elray McgregorCynthia Zakariyya Helfman 05/31/2017, 12:21 PM

## 2017-05-31 NOTE — Progress Notes (Signed)
Occupational Therapy Session Note  Patient Details  Name: Brendan [name] MRN: [id number] Date of Birth: 07/31/1940  Today's Date: 05/31/2017 OT Individual Time: 1100-1200 OT Individual Time Calculation (min): 60 min    Short Term Goals: Week 2:  OT Short Term Goal 1 (Week 2): Pt will complete toilet transfer with Mod A 1 helper OT Short Term Goal 2 (Week 2): Pt will scan to the Rt for 1 ADL item with mod vcs OT Short Term Goal 3 (Week 2): Pt will complete UB dressing with Mod assist OT Short Term Goal 4 (Week 2): Pt will complete bathing with mod assist and min cues for initiation/sequencing  Skilled Therapeutic Interventions/Progress Updates:    Pt received in w/c with his wife stating pt was very fatigued from working so hard in earlier therapy sessions.  She stated his UB needed to be bathed and she wanted to just do it for him, but agreeable to having therapist assist with pt engaging in task.  Pt bathed self with A needed for R arm and back. Donned a gown as he was receiving IV treatment.   Pt taken to therapy gym. He worked on Print production plannerstand pivot transfer to mat to his R side with max A due to delayed initiation with moving R foot.  On mat pt immediately had a posterior lean needing mod - max A to maintain upright.  Attempted mat work for 20 min but it was very difficult having pt hold his sitting balance.  With balance support, had pt work on table top towel slides and UE rangers. Slight active shoulder retraction/ extension.  Pt becoming sleepy, +2 for safety max a sq pivot back to w/c. From w/c, hand over hand grasp/release facilitation with cones.  Increased tone noted in finger flexors.  PROM to wrist extensors.  Pt taken back to room and transferred to bed max A and adjusted in bed. Wife in room with pt.   Therapy Documentation Precautions:  Precautions Precautions: Fall Precaution Comments: Lt gaze preference, Rt hemi, aphasic, R inattention Restrictions Weight Bearing Restrictions:  No    Vital Signs: Therapy Vitals Pulse Rate: (!) 59 BP: (!) 88/44   Pain: No c/o pain during OT session  ADL: ADL ADL Comments: Please see functional navigator for ADL status  See Function Navigator for Current Functional Status.   Therapy/Group: Individual Therapy  Meara Wiechman 05/31/2017, 1:06 PM

## 2017-05-31 NOTE — Progress Notes (Addendum)
On Monday I met with the patient's wife, Brendan [name].  I told her I was just checking in as an extension of support from the Parkinson's clinic and not interfering with the work that the rehab social worker, case Freight forwarder and/or [facility] will provide.  The patient's wife seems very overwhelmed, but determined to plan to bring her husband home after he is discharged from the hospital.  She is most concerned about transportation.  She stated that her brother-in-law is going to be building a ramp.  I talked with the patient's wife also about self-care and gave her some tools on sleep hygiene and how to manage anxiety and stress that interferes with sleep.  On Tuesday, I met with the patient's daughter, Brendan [name].  She wanted me to share the concern of the tremor with the hand that he is using to eat. Apparently, the food is not making it to his mouth as he has tremor and then larger jerk type movements that causes the food to fall off. We talked about OT at home would be helpful to address some adaptations. I shared that I would pass this information on to Dr. Carles Collet.   The patient's daughter expressed some concern about a ramp and was unclear about when it would be able to be built.  She talked about focusing on maximizing the time that she has off to help prepare the home for her father's arrival there.  She is taken time through [facility], but is aware that this time will run out. Self care was discussed as she is very torn on what she can do vs her other life responsibilities.   I brought up LTC as an option if they exhaust all efforts. Trying to take him home is her moms priority, but there are concerns of the ability to care for him.   I have been researching some resources.  If the family is unable to build a ramp, that [employer] has a program where they will build ramps.  In addition, [facility] may be able provide some transportation for the patient.  I will continue to research information/ resources that may  benefit the family the role of this point the and I will documented strategy would ever.  The plan is that I will go back Th/ Fri am  afternoon with some of the resources I find.

## 2017-05-31 NOTE — Progress Notes (Signed)
Speech Language Pathology Daily Session Note  Patient Details  Name: Brendan [name] MRN: [id number] Date of Birth: 03/18/1941  Today's Date: 05/31/2017 SLP Individual Time: 1005-1100 SLP Individual Time Calculation (min): 55 min  Short Term Goals: Week 2: SLP Short Term Goal 1 (Week 2): Pt will sustain attention to basic familiar task  for ~ 15 minutes with Mod A cues.  SLP Short Term Goal 2 (Week 2): Pt will convey immediate needs/wants to caregivers with mod assist verbal cues.   SLP Short Term Goal 3 (Week 2): Pt will increase speech intelligibility at the simple phrase level to achieve ~ 75% intelligibility given Mod A cues for use of speech intelligibility strategies.  SLP Short Term Goal 4 (Week 2): Pt will complete basic familiar problem solving tasks related to ADLs with Mod A cues.  SLP Short Term Goal 5 (Week 2): Pt will consume therapeutic trials of dys 3 textures with minimal overt s/s of aspiration and Min A cues for use of compensatory swallow strategies.  SLP Short Term Goal 5 - Progress (Week 2): Other (comment)(revised due to error)  Skilled Therapeutic Interventions:  Pt was seen for skilled ST targeting goals for communication and family education.  Pt was much more alert and participatory today in comparison to yesterday's therapy session.  Seroquel dose back to 12.5 mg last night.  Pt was able to identify object when named from a field of three for 100% accuracy with supervision cues but needed mod assist to match object to word from a field of  two.  He could name basic objects when also provided with a visual model for ~75% accuracy with min-mod assist verbal cues to recognize and correct perseverative errors.  Pt needed mod assist verbal and visual cues during phrase closure tasks.  He was also able to receptively name basic, familiar objects when given a choice of three words with min-mod assist. Pt making good progress towards goals.    Wife had questions regarding  activities for at home to address pt's communication.  Therapist discussed ideas for both structured and unstructured tasks to continue to address communication compensation and remediation in the home environment.  Pt was left sitting in wheelchair with wife at bedside.  Function:  Eating Eating                 Cognition Comprehension Comprehension assist level: Understands basic 75 - 89% of the time/ requires cueing 10 - 24% of the time  Expression   Expression assist level: Expresses basic 50 - 74% of the time/requires cueing 25 - 49% of the time. Needs to repeat parts of sentences.  Social Interaction Social Interaction assist level: Interacts appropriately 50 - 74% of the time - May be physically or verbally inappropriate.  Problem Solving Problem solving assist level: Solves basic 25 - 49% of the time - needs direction more than half the time to initiate, plan or complete simple activities  Memory Memory assist level: Recognizes or recalls 25 - 49% of the time/requires cueing 50 - 75% of the time    Pain Pain Assessment Pain Assessment: No/denies pain   Therapy/Group: Individual Therapy  Shaquavia Whisonant, Melanee SpryNicole L 05/31/2017, 1:06 PM

## 2017-06-01 ENCOUNTER — Inpatient Hospital Stay (HOSPITAL_COMMUNITY): Payer: PPO | Admitting: Occupational Therapy

## 2017-06-01 ENCOUNTER — Inpatient Hospital Stay (HOSPITAL_COMMUNITY): Payer: PPO | Admitting: Physical Therapy

## 2017-06-01 ENCOUNTER — Encounter: Payer: Self-pay | Admitting: Psychology

## 2017-06-01 ENCOUNTER — Ambulatory Visit (HOSPITAL_COMMUNITY): Payer: PPO | Admitting: Speech Pathology

## 2017-06-01 LAB — BASIC METABOLIC PANEL
Anion gap: 9 (ref 5–15)
BUN: 32 mg/dL — ABNORMAL HIGH (ref 6–20)
CO2: 24 mmol/L (ref 22–32)
Calcium: 8.8 mg/dL — ABNORMAL LOW (ref 8.9–10.3)
Chloride: 104 mmol/L (ref 101–111)
Creatinine, Ser: 1.3 mg/dL — ABNORMAL HIGH (ref 0.61–1.24)
GFR calc Af Amer: >60 mL/min (ref >60)
GFR calc non Af Amer: 52 mL/min — ABNORMAL LOW (ref >60)
Glucose, Bld: 139 mg/dL — ABNORMAL HIGH (ref 65–99)
Potassium: 4.6 mmol/L (ref 3.5–5.1)
Sodium: 137 mmol/L (ref 135–145)

## 2017-06-01 LAB — URINALYSIS, ROUTINE W REFLEX MICROSCOPIC
Bilirubin Urine: NEGATIVE
Glucose, UA: NEGATIVE mg/dL
Hgb urine dipstick: NEGATIVE
Ketones, ur: NEGATIVE mg/dL
Leukocytes, UA: NEGATIVE
Nitrite: NEGATIVE
Protein, ur: NEGATIVE mg/dL
Specific Gravity, Urine: 1.009 (ref 1.005–1.030)
pH: 5 (ref 5.0–8.0)

## 2017-06-01 LAB — GLUCOSE, CAPILLARY
Glucose-Capillary: 123 mg/dL — ABNORMAL HIGH (ref 65–99)
Glucose-Capillary: 153 mg/dL — ABNORMAL HIGH (ref 65–99)

## 2017-06-01 MED ORDER — SODIUM CHLORIDE 0.9 % IV SOLN
INTRAVENOUS | Status: DC
  Administered 2017-06-01 – 2017-06-03 (×3): via INTRAVENOUS
  Administered 2017-06-04: 900 mL via INTRAVENOUS

## 2017-06-01 MED ORDER — PROPRANOLOL HCL 40 MG PO TABS
40.0000 mg | ORAL_TABLET | Freq: Two times a day (BID) | ORAL | Status: DC
  Administered 2017-06-01 – 2017-06-09 (×15): 40 mg via ORAL
  Filled 2017-06-01 (×16): qty 1

## 2017-06-01 MED ORDER — SODIUM CHLORIDE 0.45 % IV SOLN: INTRAVENOUS | Status: DC

## 2017-06-01 NOTE — Progress Notes (Signed)
Patient ID: Brendan [name], male   DOB: 04/15/1941, 77 y.o.   MRN: [id number] Social visit...mutual patient...' Alert, smiling, working with therapist. Family present report "best day yet" as pt stands with minimal assistance x2. Right hand grip improved today. He continues to require frequent cues for focus and safety.  Dr. Venetia MaxonStern will dictate letter for wife/daughter in their efforts to obtain medical/legal/financial Power of Attorney.  Target discharge to home is Feb 23rd.

## 2017-06-01 NOTE — Progress Notes (Signed)
Subjective/Complaints: Pt seen lying in bed this AM.  He slept well overnight per pt and daughter.  Daughter with questions about blood pressure and lack of bracing.  Need for bracing discussed with nursing.    ROS: Unable to assess due to aphasia.  Objective: Vital Signs: Blood pressure (!) 110/51, pulse (!) 55, temperature 98.2 F (36.8 C), temperature source Oral, resp. rate 18, height '5\' 9"'  (1.753 m), weight 83.9 kg (184 lb 15.5 oz), SpO2 98 %. No results found. Results for orders placed or performed during the hospital encounter of 05/18/17 (from the past 72 hour(s))  Glucose, capillary     Status: Abnormal   Collection Time: 05/29/17 11:20 AM  Result Value Ref Range   Glucose-Capillary 148 (H) 65 - 99 mg/dL  Glucose, capillary     Status: Abnormal   Collection Time: 05/29/17  4:34 PM  Result Value Ref Range   Glucose-Capillary 118 (H) 65 - 99 mg/dL  Glucose, capillary     Status: Abnormal   Collection Time: 05/29/17  8:55 PM  Result Value Ref Range   Glucose-Capillary 195 (H) 65 - 99 mg/dL   Comment 1 Notify RN   Glucose, capillary     Status: Abnormal   Collection Time: 05/30/17  6:39 AM  Result Value Ref Range   Glucose-Capillary 139 (H) 65 - 99 mg/dL   Comment 1 Notify RN   Glucose, capillary     Status: Abnormal   Collection Time: 05/30/17 12:00 PM  Result Value Ref Range   Glucose-Capillary 195 (H) 65 - 99 mg/dL  Glucose, capillary     Status: Abnormal   Collection Time: 05/30/17  4:57 PM  Result Value Ref Range   Glucose-Capillary 114 (H) 65 - 99 mg/dL  Glucose, capillary     Status: Abnormal   Collection Time: 05/30/17  9:00 PM  Result Value Ref Range   Glucose-Capillary 154 (H) 65 - 99 mg/dL   Comment 1 Notify RN   Basic metabolic panel     Status: Abnormal   Collection Time: 05/31/17  6:25 AM  Result Value Ref Range   Sodium 137 135 - 145 mmol/L   Potassium 5.2 (H) 3.5 - 5.1 mmol/L   Chloride 101 101 - 111 mmol/L   CO2 24 22 - 32 mmol/L   Glucose,  Bld 129 (H) 65 - 99 mg/dL   BUN 35 (H) 6 - 20 mg/dL   Creatinine, Ser 1.76 (H) 0.61 - 1.24 mg/dL   Calcium 9.0 8.9 - 10.3 mg/dL   GFR calc non Af Amer 36 (L) >60 mL/min   GFR calc Af Amer 42 (L) >60 mL/min    Comment: (NOTE) The eGFR has been calculated using the CKD EPI equation. This calculation has not been validated in all clinical situations. eGFR's persistently <60 mL/min signify possible Chronic Kidney Disease.    Anion gap 12 5 - 15    Comment: Performed at [facility] [address] [place], [place] [address]  Glucose, capillary     Status: Abnormal   Collection Time: 05/31/17  6:59 AM  Result Value Ref Range   Glucose-Capillary 116 (H) 65 - 99 mg/dL   Comment 1 Notify RN   Glucose, capillary     Status: Abnormal   Collection Time: 05/31/17 12:10 PM  Result Value Ref Range   Glucose-Capillary 133 (H) 65 - 99 mg/dL  Glucose, capillary     Status: Abnormal   Collection Time: 05/31/17  4:45 PM  Result Value Ref Range   Glucose-Capillary 105 (H) 65 - 99 mg/dL  Glucose, capillary     Status: Abnormal   Collection Time: 05/31/17  9:01 PM  Result Value Ref Range   Glucose-Capillary 159 (H) 65 - 99 mg/dL  Basic metabolic panel     Status: Abnormal   Collection Time: 06/01/17  4:01 AM  Result Value Ref Range   Sodium 137 135 - 145 mmol/L   Potassium 4.6 3.5 - 5.1 mmol/L   Chloride 104 101 - 111 mmol/L   CO2 24 22 - 32 mmol/L   Glucose, Bld 139 (H) 65 - 99 mg/dL   BUN 32 (H) 6 - 20 mg/dL   Creatinine, Ser 1.30 (H) 0.61 - 1.24 mg/dL   Calcium 8.8 (L) 8.9 - 10.3 mg/dL   GFR calc non Af Amer 52 (L) >60 mL/min   GFR calc Af Amer >60 >60 mL/min    Comment: (NOTE) The eGFR has been calculated using the CKD EPI equation. This calculation has not been validated in all clinical situations. eGFR's persistently <60 mL/min signify possible Chronic Kidney Disease.    Anion gap 9 5 - 15    Comment: Performed at [facility] [address] [place], [place]  [address]  Glucose, capillary     Status: Abnormal   Collection Time: 06/01/17  6:53 AM  Result Value Ref Range   Glucose-Capillary 123 (H) 65 - 99 mg/dL   Comment 1 Notify RN     General: NAD. Well-developed.  HEENT: Normocephalic, atraumatic. Cardio: RRR and no JVD. Resp: CTA B/L and Unlabored GI: BS positive and ND Musc/Skel:  No edema, no tenderness. Neuro: Alert, fair basic spontaneous speech  Expressive >> Receptive aphasia, able to select between choices Motor:  RUE/RLE: 0/5 proximal to distal (stable), increase tone UE and LE (stable) LUE tremor Dysarthria Skin:   Intact. Warm and dry   Assessment/Plan: 1. Functional deficits secondary to left frontal parietal intraparenchymal hemorrhage with right hemiparesis, aphasia and cognitive deficits which require 3+ hours per day of interdisciplinary therapy in a comprehensive inpatient rehab setting. Physiatrist is providing close team supervision and 24 hour management of active medical problems listed below. Physiatrist and rehab team continue to assess barriers to discharge/monitor patient progress toward functional and medical goals. FIM: Function - Bathing Position: Wheelchair/chair at sink Body parts bathed by patient: Left arm, Chest, Abdomen, Right upper leg, Left upper leg Body parts bathed by helper: Buttocks, Back, Right arm, Front perineal area Bathing not applicable: Left lower leg, Right lower leg Assist Level: 2 helpers(sit > stand level)  Function- Upper Body Dressing/Undressing What is the patient wearing?: Button up shirt Button up shirt - Perfomed by patient: Thread/unthread left sleeve Button up shirt - Perfomed by helper: Thread/unthread right sleeve, Pull shirt around back, Button/unbutton shirt Assist Level: (Max assist) Function - Lower Body Dressing/Undressing What is the patient wearing?: Pants, Non-skid slipper socks Position: Wheelchair/chair at sink Pants- Performed by patient: Thread/unthread left  pants leg Pants- Performed by helper: Thread/unthread right pants leg, Pull pants up/down Non-skid slipper socks- Performed by helper: Don/doff right sock, Don/doff left sock TED Hose - Performed by helper: Don/doff right TED hose, Don/doff left TED hose Assist for footwear: Dependant Assist for lower body dressing: 2 Helpers(sit > stand)  Function - Toileting Toileting activity did not occur: No continent bowel/bladder event Toileting steps completed by patient: Performs perineal hygiene Toileting steps completed by helper: Adjust clothing prior to toileting, Adjust clothing after  toileting Toileting Assistive Devices: Grab bar or rail Assist level: Two helpers(per Thea Alken, NT report)  Function - Air cabin crew transfer activity did not occur: (not attempted) Assist level to toilet: 2 helpers(per Thea Alken, NT report)  Function - Chair/bed transfer Chair/bed transfer method: Squat pivot Chair/bed transfer assist level: Maximal assist (Pt 25 - 49%/lift and lower) Chair/bed transfer assistive device: Armrests Chair/bed transfer details: Verbal cues for technique, Verbal cues for precautions/safety, Verbal cues for sequencing, Verbal cues for safe use of DME/AE  Function - Locomotion: Wheelchair Type: Manual Wheelchair activity did not occur: Safety/medical concerns Max wheelchair distance: 100 ft Assist Level: Touching or steadying assistance (Pt > 75%) Wheel 50 feet with 2 turns activity did not occur: Safety/medical concerns Assist Level: Touching or steadying assistance (Pt > 75%) Wheel 150 feet activity did not occur: Safety/medical concerns Function - Locomotion: Ambulation Assistive device: Walker-platform Max distance: 3 Assist level: Maximal assist (Pt 25 - 49%) Walk 10 feet activity did not occur: Safety/medical concerns Assist level: Moderate assist (Pt 50 - 74%) Walk 50 feet with 2 turns activity did not occur: Safety/medical concerns Assist level:  Moderate assist (Pt 50 - 74%) Walk 150 feet activity did not occur: Safety/medical concerns Walk 10 feet on uneven surfaces activity did not occur: Safety/medical concerns  Function - Comprehension Comprehension: Auditory Comprehension assist level: Understands basic 75 - 89% of the time/ requires cueing 10 - 24% of the time  Function - Expression Expression: Verbal Expression assist level: Expresses basic 50 - 74% of the time/requires cueing 25 - 49% of the time. Needs to repeat parts of sentences.  Function - Social Interaction Social Interaction assist level: Interacts appropriately 50 - 74% of the time - May be physically or verbally inappropriate.  Function - Problem Solving Problem solving assist level: Solves basic 25 - 49% of the time - needs direction more than half the time to initiate, plan or complete simple activities  Function - Memory Memory assistive device: Other (Comment)(Calendar ) Memory assist level: Recognizes or recalls 25 - 49% of the time/requires cueing 50 - 75% of the time Patient normally able to recall (first 3 days only): That he or she is in a hospital  Medical Problem List and Plan: 1.Decreased functional mobilitysecondary to left frontal intraparenchymal hemorrhage with recent deep brain stimulator for tremor.    Cont CIR   WHO/PRAFO in place 2. DVT Prophylaxis/Anticoagulation: SCDs. Monitor for any signs of DVT 3. Pain Management:Neurontin 300 mg 3 times a day, Tylenol as needed 4. Mood:Provide emotional support 5. Neuropsych: This patientisnot capable of making decisions onhisown behalf.   limit neuro-sedating meds when possible 6. Skin/Wound Care:Routine skin checks 7. Fluids/Electrolytes/Nutrition:Routine I&O's 8.Hypertension.    Cozaar 100 mg daily, decreased to 50 on 2/5   Lasix 20 mg daily d/ced on 2/5.   Propranolol 80 BID, decreased to 40 BID on 2/15 Vitals:   06/01/17 0454 06/01/17 0819  BP: (!) 104/42 (!) 110/51  Pulse:   (!) 55  Resp:    Temp:    SpO2:      IVF started, changed on 2/14, changed to 0.45% on 2/15 9.CAD with stenting/CABG. No chest pain or shortness of breath 10.Hyperlipidemia. Lipitor,Lovaza 11. Tremors:   mysoline and inderal ongoing   simulator not operational yet, no plans to initiate in near future after discussion with Neuro 12.  Dysphagia   Advanced to D3 thins    Advance diet as tolerated   CXR reviewed, unremarkable for acute process  13.  Hypoalb- Prostat 14. Prediabetes   Diet change to carb modified, CBGs and SSI started on 2/6  Unable to start metformin due to renal function and ?ability to maintain PO fluids   Amaryl started on 2/12   Will consider medication increase if persistently elevated 15. ABLA   Hb 11.9 on 2/11   Labs ordered for Monday   Cont to monitor 16. Confusion  Multifactorial due hypotension and sleep/wake changes and intracranial bleed/aphasia   CXR reviewed, unremarkable for acute changes   UA unremarkable, urine culture NG 17. Sleep disturbance with agitation  Trazodone 50 qhs scheduled on 2/5, changed to Seroquel 12.5 on 2/7, increased to 25 on 2/12, decreased back to 12.5 due to lethargy on 2/14   Melatonin started on 2/11, increased on 2/14   Risperdal PRN   Improving, but labile at times   Educated wife on environment modifications 18. Spastic hemiplegia  Baclofen 5 TID started on 2/7  Cont ROM  Cont bracing 19. AKI  Cr 1.30 on 2/15   Encourage fluids  IVF started on 2/14, changed to 0.45% on 2/15   Labs ordered for tomorrow 20.  Neurogenic bladder   Flomax increased to 0.8 on 2/11   Bethanechol started on 2/12   PVRs appear to be improving   Continue to monitor  LOS (Days) 14 A FACE TO FACE EVALUATION WAS PERFORMED  Tee Richeson Lorie Phenix 06/01/2017, 8:35 AM

## 2017-06-01 NOTE — Progress Notes (Signed)
Speech Language Pathology Weekly Progress and Session Note  Patient Details  Name: Brendan [name] MRN: [id number] Date of Birth: 30-Aug-1940  Beginning of progress report period: May 25, 2017  End of progress report period:  June 01, 2017   Today's Date: 06/01/2017 SLP Individual Time: 1300-1355 SLP Individual Time Calculation (min): 55 min  Short Term Goals: Week 2: SLP Short Term Goal 1 (Week 2): Pt will sustain attention to basic familiar task  for ~ 15 minutes with Mod A cues.  SLP Short Term Goal 1 - Progress (Week 2): Met SLP Short Term Goal 2 (Week 2): Pt will convey immediate needs/wants to caregivers with mod assist verbal cues.   SLP Short Term Goal 2 - Progress (Week 2): Met SLP Short Term Goal 3 (Week 2): Pt will increase speech intelligibility at the simple phrase level to achieve ~ 75% intelligibility given Mod A cues for use of speech intelligibility strategies.  SLP Short Term Goal 3 - Progress (Week 2): Progressing toward goal SLP Short Term Goal 4 (Week 2): Pt will complete basic familiar problem solving tasks related to ADLs with Mod A cues.  SLP Short Term Goal 4 - Progress (Week 2): Progressing toward goal SLP Short Term Goal 5 (Week 2): Pt will consume therapeutic trials of dys 3 textures with minimal overt s/s of aspiration and Min A cues for use of compensatory swallow strategies.  SLP Short Term Goal 5 - Progress (Week 2): Met    New Short Term Goals: Week 3: SLP Short Term Goal 1 (Week 3): Pt will sustain attention to basic familiar task  for ~ 15 minutes with Min A cues.  SLP Short Term Goal 2 (Week 3): Pt will convey immediate needs/wants to caregivers with min assist verbal cues.   SLP Short Term Goal 3 (Week 3): Pt will increase speech intelligibility at the phrase level to achieve ~ 75% intelligibility given Min A cues for use of speech intelligibility strategies.  SLP Short Term Goal 4 (Week 3): Pt will complete basic familiar problem solving tasks  related to ADLs with Mod A cues.  SLP Short Term Goal 5 (Week 3): Pt will consume therapeutic trials of dys 3 textures with minimal overt s/s of aspiration and supervision cues for use of compensatory swallow strategies.   Weekly Progress Updates:   Pt has made xxx gains this reporting period and has met 4 out of 5 short term goals.  Pt is currently mod assist for communication due to dysarthria and expressive language deficits.  Pt also needs mod assist for cognitive tasks due to moderate impairments.  Pt has demonstrated improved naming and initiation of verbal expression during structured and unstructured tasks.  Pt is consuming a dys 3 diet and thin liquids with min-supervision cues for use of swallowing precautions.  Pt and family education is ongoing.  Pt would continue to benefit from skilled ST while inpatient in order to maximize functional independence and reduce burden of care prior to discharge.  Anticipate that pt will need 24/7 supervision at discharge in addition to [place] follow at next level of care.     Intensity: Minumum of 1-2 x/day, 30 to 90 minutes Frequency: 3 to 5 out of 7 days Duration/Length of Stay: 21 to 28 days Treatment/Interventions: Cognitive remediation/compensation;Cueing hierarchy;Dysphagia/aspiration precaution training;Functional tasks;Patient/family education;Therapeutic Activities;Speech/Language facilitation;Internal/external aids   Daily Session  Skilled Therapeutic Interventions:  Pt was seen for skilled ST targeting dysphagia and communication.  Pt was sitting up in wheelchair upon  therapist's arrival, bright, alert, and pleasantly interactive.  Pt was initiating clear, intelligible, and appropriate verbal expression at the phrase-sentence level upon therapist's arrival and was able to convey needs/wants to therapist with supervision when given a limited number of options.  SLP facilitated the session with skilled observation completed during a meal tray of dys  3 textures and thin liquids (therapist had upgraded pt's diet per family reports of pt's progress but had up until this point been unable to see pt with a meal due to scheduling conflicts).  Pt consumed dys 3 textures and thin liquids with supervision cues for use of swallowing precautions.  No overt s/s of aspiration were evident with solids or liquids.    Therapist also facilitated the session with structured generative naming tasks.  Pt completed task with mod assist semantic and written cues for word finding.  Pt was returned to room and left in bed with bed alarm set and call bell within reach.  Goals updated on this date to reflect current progress and plan of care.        Function:   Eating Eating   Modified Consistency Diet: Yes Eating Assist Level: Set up assist for;Supervision or verbal cues   Eating Set Up Assist For: Opening containers Helper Scoops Food on Utensil: Occasionally     Cognition Comprehension Comprehension assist level: Understands basic 90% of the time/cues < 10% of the time  Expression   Expression assist level: Expresses basic 75 - 89% of the time/requires cueing 10 - 24% of the time. Needs helper to occlude trach/needs to repeat words.  Social Interaction Social Interaction assist level: Interacts appropriately 90% of the time - Needs monitoring or encouragement for participation or interaction.  Problem Solving Problem solving assist level: Solves basic 50 - 74% of the time/requires cueing 25 - 49% of the time  Memory Memory assist level: Recognizes or recalls 25 - 49% of the time/requires cueing 50 - 75% of the time   General    Pain Pain Assessment Pain Assessment: No/denies pain  Therapy/Group: Individual Therapy  Karrington Studnicka, Selinda Orion 06/01/2017, 1:59 PM

## 2017-06-01 NOTE — Progress Notes (Signed)
Occupational Therapy Session Note  Patient Details  Name: Brendan [name] MRN: [id number] Date of Birth: 11/19/1940  Today's Date: 06/01/2017 OT Individual Time: [id number] and 1447-1530 OT Individual Time Calculation (min): 45 min and 43 min   Short Term Goals: Week 2:  OT Short Term Goal 1 (Week 2): Pt will complete toilet transfer with Mod A 1 helper OT Short Term Goal 2 (Week 2): Pt will scan to the Rt for 1 ADL item with mod vcs OT Short Term Goal 3 (Week 2): Pt will complete UB dressing with Mod assist OT Short Term Goal 4 (Week 2): Pt will complete bathing with mod assist and min cues for initiation/sequencing  Skilled Therapeutic Interventions/Progress Updates:    1) Treatment session with focus on adaptive equipment to assist with self-feeding and LB dressing at bed level.  Pt received in bed eating breakfast.  Noted "jerky" tremors in LUE impacting success with eating.  Issued pt a metal spoon (instead of plastic) as pt declined attempting to use weighted spoon.  Applied wrist weight to decrease tremors.  Pt demonstrating increased success with metal spoon and wrist weight.  Pt demonstrating difficulty with bringing styrofoam cup to mouth due to tremors, provided pt with a weighted mug to increase success with pt demonstrating improvement.  Pt able to locate all food on his plate with min cues to scan to Rt to locate remaining eggs.  Due to time constraints and increased assist in standing, donned pants at bed level with cues for rolling and setup to increase body positioning to increase success.  Total assist for donning pants this session. Pt left upright in bed with daughter present.  2) Treatment session with focus on bed mobility, sit <> stand, and RUE ROM.  Pt received supine in bed with wife and daughter present. Completed bed mobility with mod cues and min assist to roll to Rt, then requiring min-mod assist to sit up to EOB.  Min-mod assist for sitting balance on EOB with increasing  assist when pt distracted or joking.  Engaged in sit <> stand x5 with min assist, RW placed in front to promote full upright standing posture.  Pt spontaneously moved Rt arm from bed towards therapist leg next to him, therefore engaged in reaching activity with pt able to elicit slight abduction/adduction.  Pt also able to elicit moderate squeeze with Rt hand this session.  [name] and daughter very encouraged by pt's improved participation, interactions, and physical performance during session.  Engaged in lateral scoots along bed for strengthening and to prepare to return to bed.  Pt able to bridge in bed and move RLE up in bed to scoot towards head of bed.  Pt overall more engaged this session.  Therapy Documentation Precautions:  Precautions Precautions: Fall Precaution Comments: Lt gaze preference, Rt hemi, aphasic, R inattention Restrictions Weight Bearing Restrictions: No Pain: Pain Assessment Pain Assessment: No/denies pain  See Function Navigator for Current Functional Status.   Therapy/Group: Individual Therapy  Rosalio LoudHOXIE, Ayza Ripoll 06/01/2017, 12:49 PM

## 2017-06-01 NOTE — Progress Notes (Signed)
Nutrition Follow-up  DOCUMENTATION CODES:   Obesity unspecified  INTERVENTION:  Continue Glucerna Shake po TID, each supplement provides 220 kcal and 10 grams of protein.  Encourage adequate PO intake.   NUTRITION DIAGNOSIS:   Inadequate oral intake related to poor appetite as evidenced by meal completion < 50%; ongoing  GOAL:   Patient will meet greater than or equal to 90% of their needs; met  MONITOR:   PO intake, Supplement acceptance, Labs, Skin, Weight trends  REASON FOR ASSESSMENT:   Malnutrition Screening Tool    ASSESSMENT:   77 y.o.right handed male with history of CAD with stenting as well as CABG, hypertension, memory loss and bilateral deep brain stimulator electrode placement per Dr. Vertell Limber 05/04/2017 for essential tremors.  Meal completion has been 100%. Pt reports good appetite with no other difficulties. Pt currently has Glucerna Shake and has been consuming them. RD to continue with current orders. Pt encouraged to eat his food a meals.    NUTRITION - FOCUSED PHYSICAL EXAM:    Most Recent Value  Orbital Region  No depletion  Upper Arm Region  No depletion  Thoracic and Lumbar Region  No depletion  Buccal Region  No depletion  Temple Region  No depletion  Clavicle Bone Region  No depletion  Clavicle and Acromion Bone Region  No depletion  Scapular Bone Region  No depletion  Dorsal Hand  No depletion  Patellar Region  No depletion  Anterior Thigh Region  No depletion  Posterior Calf Region  No depletion  Edema (RD Assessment)  Mild  Hair  Reviewed  Eyes  Reviewed  Mouth  Reviewed  Skin  Reviewed  Nails  Reviewed       Diet Order:  Fall precautions DIET DYS 3 Room service appropriate? Yes; Fluid consistency: Thin  EDUCATION NEEDS:   Not appropriate for education at this time  Skin:  Skin Assessment: Skin Integrity Issues: Skin Integrity Issues:: DTI, Incisions DTI: buttocks Incisions: head  Last BM:  2/13  Height:   Ht Readings  from Last 1 Encounters:  05/18/17 '5\' 9"'  (1.753 m)    Weight:   Wt Readings from Last 1 Encounters:  05/31/17 184 lb 15.5 oz (83.9 kg)    Ideal Body Weight:  72.72 kg  BMI:  Body mass index is 27.31 kg/m.  Estimated Nutritional Needs:   Kcal:  1900-2100  Protein:  95-110 gm  Fluid:  1.9-2.1 L    Corrin Parker, MS, RD, LDN Pager # [phone] After hours/ weekend pager # [phone]

## 2017-06-01 NOTE — Progress Notes (Signed)
Social Work Patient ID: Brendan [name], male   DOB: 07/26/1940, 77 y.o.   MRN: [id number]   Have reviewed team conference with pt and family.  They are aware that we have received continued auth for CIR from insurance to cover through to targeted d/c date of 06/09/17.  Focus ongoing will include family education and coordination of firm d/c care plan.  Nuh Lipton, LCSW

## 2017-06-01 NOTE — Progress Notes (Signed)
Stopped by the patient's room this morning to drop off some resources.  Neither patient or family members in the room.  Left a large white envelope for the patient's family.  It included 2 copies of resources for [facility] for areas of possible need that have been identified.    Resources included:  . [facility].  It appears that this serves all of [facility] [place] for medical appointments. . Home delivered meals for the aging population. . Regional consolidated services.  They are all for some assistance with respite care as well as a light home modifications which includes help with building a ramp.  [employer]. [facility].  They have a ramp program.  They do not pay for materials but arrange for volunteers to build a ramp. . [facility] [place].  I am aware that the daughter has a meeting around 1.  I will attempt to call or go to visit the patient's wife or daughter later this afternoon to go over any questions about the resources are provided.

## 2017-06-01 NOTE — Progress Notes (Signed)
Physical Therapy Weekly Progress Note  Patient Details  Name: Brendan [name] MRN: [id number] Date of Birth: 06-Jan-1941  Beginning of progress report period: May 26, 2017 End of progress report period: June 01, 2017  Today's Date: 06/01/2017 PT Individual Time: 1030-1127 PT Individual Time Calculation (min): 57 min   Pt with no c/o pain.  Pt performs bed mobility with mod A, mod multimodal cuing.  Sitting balance with min A with max A to don shirt, total A to don ted hose and shoes.  Gait 25', 15' x 2 with RW and min A.  Mod A for sit to stand repetitions with cues for upright posture.  Pt c/o "dizzy'.  BP 117/61. Supine NMR with AAROM bridging, LTR, hip/knee flex/ext with LEs on theraball, piriformis and hamstring stretching with pt with improved upright posture in sitting after stretching. Pt left in room with wife/daughter present, chair alarm and quick release belt donned.   Patient has met 3 of 5 short term goals.  Pt with fluctuations in physical abilities ranging from min-max A depending on time of day, fatigue level and meds.  Pt's goals downgraded to mod A to take into account fluctuations in functional abilities.  Family ed has been initiated with pt's wife.  Patient continues to demonstrate the following deficits muscle weakness, abnormal tone, decreased coordination and decreased motor planning, decreased initiation, decreased problem solving and delayed processing and decreased sitting balance, decreased standing balance, decreased postural control, hemiplegia and decreased balance strategies and therefore will continue to benefit from skilled PT intervention to increase functional independence with mobility.  Patient not progressing toward long term goals.  See goal revision..  Continue plan of care.  PT Short Term Goals Week 2:  PT Short Term Goal 1 (Week 2): Pt will increase bed mobility to Mod A PT Short Term Goal 1 - Progress (Week 2): Met PT Short Term Goal 2 (Week 2):  Pt will increase transfers to Mod A PT Short Term Goal 2 - Progress (Week 2): Partly met PT Short Term Goal 3 (Week 2): Pt will ambulate with LRAD about 25 feet with mod A.  PT Short Term Goal 3 - Progress (Week 2): Partly met PT Short Term Goal 4 (Week 2): Pt will ascend/descend 4 stairs with B rails and max A.  PT Short Term Goal 4 - Progress (Week 2): Progressing toward goal PT Short Term Goal 5 (Week 2): Pt will propel w/c x 50 ft with Min A PT Short Term Goal 5 - Progress (Week 2): Partly met Week 3:  PT Short Term Goal 1 (Week 3): =LTG  Skilled Therapeutic Interventions/Progress Updates:  Ambulation/gait training;Balance/vestibular training;Community reintegration;DME/adaptive equipment instruction;Functional electrical stimulation;Neuromuscular re-education;Functional mobility training;Patient/family education;Stair training;Splinting/orthotics;Therapeutic Activities;Therapeutic Exercise;UE/LE Coordination activities;UE/LE Strength taining/ROM;Wheelchair propulsion/positioning;Discharge planning;Cognitive remediation/compensation;Pain management     See Function Navigator for Current Functional Status.  Therapy/Group: Individual Therapy  Promise Weldin 06/01/2017, 11:31 AM

## 2017-06-02 ENCOUNTER — Inpatient Hospital Stay (HOSPITAL_COMMUNITY): Payer: PPO | Admitting: Physical Therapy

## 2017-06-02 ENCOUNTER — Inpatient Hospital Stay (HOSPITAL_COMMUNITY): Payer: PPO | Admitting: Occupational Therapy

## 2017-06-02 DIAGNOSIS — N390 Urinary tract infection, site not specified: Secondary | ICD-10-CM

## 2017-06-02 DIAGNOSIS — A499 Bacterial infection, unspecified: Secondary | ICD-10-CM

## 2017-06-02 LAB — GLUCOSE, CAPILLARY
Glucose-Capillary: 120 mg/dL — ABNORMAL HIGH (ref 65–99)
Glucose-Capillary: 198 mg/dL — ABNORMAL HIGH (ref 65–99)
Glucose-Capillary: 95 mg/dL (ref 65–99)
Glucose-Capillary: 115 mg/dL — ABNORMAL HIGH (ref 65–99)

## 2017-06-02 MED ORDER — CEPHALEXIN 250 MG PO CAPS
250.0000 mg | ORAL_CAPSULE | Freq: Three times a day (TID) | ORAL | Status: DC
  Administered 2017-06-02 – 2017-06-04 (×6): 250 mg via ORAL
  Filled 2017-06-02 (×6): qty 1

## 2017-06-02 NOTE — Plan of Care (Signed)
  Not Progressing RH BLADDER ELIMINATION RH STG MANAGE BLADDER WITH ASSISTANCE Description STG Manage Bladder With total Assistance I&O cath   06/02/2017 [id number] - Not Progressing by Khaza Blansett, Danella MaiersAshley M, RN

## 2017-06-02 NOTE — Progress Notes (Signed)
Physical Therapy Session Note  Patient Details  Name: Brendan [name] MRN: [id number] Date of Birth: 1940-08-17  Today's Date: 06/02/2017 PT Individual Time: 1630-1700 PT Individual Time Calculation (min): 30 min   Short Term Goals: Week 1:  PT Short Term Goal 1 (Week 1): Pt will increase bed mobility to mod A. PT Short Term Goal 1 - Progress (Week 1): Progressing toward goal PT Short Term Goal 2 (Week 1): Pt will increase transfers to mod A.  PT Short Term Goal 2 - Progress (Week 1): Progressing toward goal PT Short Term Goal 3 (Week 1): Pt will ambulate with LRAD about 25 feet with mod A.  PT Short Term Goal 3 - Progress (Week 1): Progressing toward goal PT Short Term Goal 4 (Week 1): Pt will ascend/descend 4 stairs with B rails and max A.  PT Short Term Goal 4 - Progress (Week 1): Progressing toward goal PT Short Term Goal 5 (Week 1): Pt will propel w/c about 25 feet with min A.  PT Short Term Goal 5 - Progress (Week 1): Progressing toward goal  Skilled Therapeutic Interventions/Progress Updates:   Pt in w/c and agreeable to therapy, denies pain. Total assist w/c transport to/from gym for time management. Worked on standing tolerance this session while performing cognitive tasks. Performed multiple sit<>stand transfers to high/low table w/ min assist to min guard. Mod verbal and tactile cues for posture and to utilize LUE for tasks in standing including sorting and sequencing tasks. Verbal cues to attend to R environment. Able to stand for 2-3 min before needing brief rest break. Returned to room and ended session sitting in w/c and in care of wife, all needs met.   Therapy Documentation Precautions:  Precautions Precautions: Fall Precaution Comments: Lt gaze preference, Rt hemi, aphasic, R inattention Restrictions Weight Bearing Restrictions: No  See Function Navigator for Current Functional Status.   Therapy/Group: Individual Therapy  Collins Dimaria K Arnette 06/02/2017, 7:36 PM

## 2017-06-02 NOTE — Progress Notes (Signed)
Subjective/Complaints: No new issues. Had a good night. Wife at bedside and asked about pros and cons of I/o cath vs foley.   ROS: pt denies nausea, vomiting, diarrhea, cough, shortness of breath or chest pain    Objective: Vital Signs: Blood pressure 140/60, pulse 70, temperature 98.5 F (36.9 C), temperature source Oral, resp. rate 18, height _0  (1.753 m), weight 83.9 kg (184 lb 15.5 oz), SpO2 98 %. No results found. Results for orders placed or performed during the hospital encounter of 05/18/17 (from the past 72 hour(s))  Glucose, capillary     Status: Abnormal   Collection Time: 05/30/17 12:00 PM  Result Value Ref Range   Glucose-Capillary 195 (H) 65 - 99 mg/dL  Glucose, capillary     Status: Abnormal   Collection Time: 05/30/17  4:57 PM  Result Value Ref Range   Glucose-Capillary 114 (H) 65 - 99 mg/dL  Glucose, capillary     Status: Abnormal   Collection Time: 05/30/17  9:00 PM  Result Value Ref Range   Glucose-Capillary 154 (H) 65 - 99 mg/dL   Comment 1 Notify RN   Basic metabolic panel     Status: Abnormal   Collection Time: 05/31/17  6:25 AM  Result Value Ref Range   Sodium 137 135 - 145 mmol/L   Potassium 5.2 (H) 3.5 - 5.1 mmol/L   Chloride 101 101 - 111 mmol/L   CO2 24 22 - 32 mmol/L   Glucose, Bld 129 (H) 65 - 99 mg/dL   BUN 35 (H) 6 - 20 mg/dL   Creatinine, Ser 1.76 (H) 0.61 - 1.24 mg/dL   Calcium 9.0 8.9 - 10.3 mg/dL   GFR calc non Af Amer 36 (L) >60 mL/min   GFR calc Af Amer 42 (L) >60 mL/min    Comment: (NOTE) The eGFR has been calculated using the CKD EPI equation. This calculation has not been validated in all clinical situations. eGFR's persistently <60 mL/min signify possible Chronic Kidney Disease.    Anion gap 12 5 - 15    Comment: Performed at [facility] [address] [place], [place] [address]  Glucose, capillary     Status: Abnormal   Collection Time: 05/31/17  6:59 AM  Result Value Ref Range   Glucose-Capillary 116 (H) 65 -  99 mg/dL   Comment 1 Notify RN   Glucose, capillary     Status: Abnormal   Collection Time: 05/31/17 12:10 PM  Result Value Ref Range   Glucose-Capillary 133 (H) 65 - 99 mg/dL  Urinalysis, Routine w reflex microscopic     Status: None   Collection Time: 05/31/17  2:19 PM  Result Value Ref Range   Color, Urine YELLOW YELLOW   APPearance CLEAR CLEAR   Specific Gravity, Urine 1.009 1.005 - 1.030   pH 5.0 5.0 - 8.0   Glucose, UA NEGATIVE NEGATIVE mg/dL   Hgb urine dipstick NEGATIVE NEGATIVE   Bilirubin Urine NEGATIVE NEGATIVE   Ketones, ur NEGATIVE NEGATIVE mg/dL   Protein, ur NEGATIVE NEGATIVE mg/dL   Nitrite NEGATIVE NEGATIVE   Leukocytes, UA NEGATIVE NEGATIVE    Comment: Performed at [facility] [address] [place], [place] [address]  Glucose, capillary     Status: Abnormal   Collection Time: 05/31/17  4:45 PM  Result Value Ref Range   Glucose-Capillary 105 (H) 65 - 99 mg/dL  Glucose, capillary     Status: Abnormal   Collection Time: 05/31/17  9:01 PM  Result Value Ref Range   Glucose-Capillary 159 (H) 65 - 99 mg/dL  Basic metabolic panel     Status: Abnormal   Collection Time: 06/01/17  4:01 AM  Result Value Ref Range   Sodium 137 135 - 145 mmol/L   Potassium 4.6 3.5 - 5.1 mmol/L   Chloride 104 101 - 111 mmol/L   CO2 24 22 - 32 mmol/L   Glucose, Bld 139 (H) 65 - 99 mg/dL   BUN 32 (H) 6 - 20 mg/dL   Creatinine, Ser 1.30 (H) 0.61 - 1.24 mg/dL   Calcium 8.8 (L) 8.9 - 10.3 mg/dL   GFR calc non Af Amer 52 (L) >60 mL/min   GFR calc Af Amer >60 >60 mL/min    Comment: (NOTE) The eGFR has been calculated using the CKD EPI equation. This calculation has not been validated in all clinical situations. eGFR's persistently <60 mL/min signify possible Chronic Kidney Disease.    Anion gap 9 5 - 15    Comment: Performed at [facility] [address] [place], [place] [address]  Glucose, capillary     Status: Abnormal   Collection Time: 06/01/17  6:53 AM  Result  Value Ref Range   Glucose-Capillary 123 (H) 65 - 99 mg/dL   Comment 1 Notify RN   Urine Culture     Status: Abnormal (Preliminary result)   Collection Time: 06/01/17  2:19 PM  Result Value Ref Range   Specimen Description URINE, CATHETERIZED    Special Requests NONE    Culture (A)     >=100,000 COLONIES/mL UNIDENTIFIED ORGANISM Performed at [facility], [place] [address], [place], [place] [address]    Report Status PENDING   Glucose, capillary     Status: Abnormal   Collection Time: 06/01/17  9:07 PM  Result Value Ref Range   Glucose-Capillary 153 (H) 65 - 99 mg/dL  Glucose, capillary     Status: Abnormal   Collection Time: 06/02/17  6:47 AM  Result Value Ref Range   Glucose-Capillary 120 (H) 65 - 99 mg/dL  Glucose, capillary     Status: Abnormal   Collection Time: 06/02/17 11:39 AM  Result Value Ref Range   Glucose-Capillary 198 (H) 65 - 99 mg/dL    General: NAD. Well-developed.  HEENT: Normocephalic, atraumatic. Cardio: RRR without murmur. No JVD . Resp: CTA Bilaterally without wheezes or rales. Normal effort  GI: BS positive and ND Musc/Skel:  No edema, no tenderness. Neuro: Alert, fair basic spontaneous speech  Expressive >> Receptive aphasia, follows simple commands Motor:  RUE/RLE: 0/5 proximal to distal (stable), increase tone UE and LE (stable) LUE tremor Dysarthria Skin:   Intact. Warm and dry   Assessment/Plan: 1. Functional deficits secondary to left frontal parietal intraparenchymal hemorrhage with right hemiparesis, aphasia and cognitive deficits which require 3+ hours per day of interdisciplinary therapy in a comprehensive inpatient rehab setting. Physiatrist is providing close team supervision and 24 hour management of active medical problems listed below. Physiatrist and rehab team continue to assess barriers to discharge/monitor patient progress toward functional and medical goals. FIM: Function - Bathing Position: Wheelchair/chair at sink Body  parts bathed by patient: Left arm, Chest, Abdomen, Right upper leg, Left upper leg Body parts bathed by helper: Buttocks, Back, Right arm, Front perineal area Bathing not applicable: Left lower leg, Right lower leg Assist Level: 2 helpers(sit > stand level)  Function- Upper Body Dressing/Undressing What is the patient wearing?: Button up shirt Button up shirt - Perfomed  by patient: Thread/unthread left sleeve Button up shirt - Perfomed by helper: Thread/unthread right sleeve, Pull shirt around back, Button/unbutton shirt Assist Level: (Max assist) Function - Lower Body Dressing/Undressing What is the patient wearing?: Pants Position: Bed Pants- Performed by patient: Thread/unthread left pants leg Pants- Performed by helper: Thread/unthread right pants leg, Pull pants up/down, Thread/unthread left pants leg Non-skid slipper socks- Performed by helper: Don/doff right sock, Don/doff left sock TED Hose - Performed by helper: Don/doff right TED hose, Don/doff left TED hose Assist for footwear: Dependant Assist for lower body dressing: (Total assist)  Function - Toileting Toileting activity did not occur: No continent bowel/bladder event Toileting steps completed by patient: Performs perineal hygiene Toileting steps completed by helper: Adjust clothing prior to toileting, Adjust clothing after toileting Toileting Assistive Devices: Grab bar or rail Assist level: Two helpers(per Thea Alken, NT report)  Function - Air cabin crew transfer activity did not occur: (not attempted) Assist level to toilet: 2 helpers(per [employer], NT report)  Function - Chair/bed transfer Chair/bed transfer method: Squat pivot Chair/bed transfer assist level: Maximal assist (Pt 25 - 49%/lift and lower) Chair/bed transfer assistive device: Armrests Chair/bed transfer details: Verbal cues for technique, Verbal cues for precautions/safety, Verbal cues for sequencing, Verbal cues for safe use of  DME/AE  Function - Locomotion: Wheelchair Type: Manual Wheelchair activity did not occur: Safety/medical concerns Max wheelchair distance: 100 ft Assist Level: Touching or steadying assistance (Pt > 75%) Wheel 50 feet with 2 turns activity did not occur: Safety/medical concerns Assist Level: Touching or steadying assistance (Pt > 75%) Wheel 150 feet activity did not occur: Safety/medical concerns Function - Locomotion: Ambulation Assistive device: Walker-platform Max distance: 3 Assist level: Maximal assist (Pt 25 - 49%) Walk 10 feet activity did not occur: Safety/medical concerns Assist level: Moderate assist (Pt 50 - 74%) Walk 50 feet with 2 turns activity did not occur: Safety/medical concerns Assist level: Moderate assist (Pt 50 - 74%) Walk 150 feet activity did not occur: Safety/medical concerns Walk 10 feet on uneven surfaces activity did not occur: Safety/medical concerns  Function - Comprehension Comprehension: Auditory Comprehension assist level: Understands basic 90% of the time/cues < 10% of the time  Function - Expression Expression: Verbal Expression assist level: Expresses basic 75 - 89% of the time/requires cueing 10 - 24% of the time. Needs helper to occlude trach/needs to repeat words.  Function - Social Interaction Social Interaction assist level: Interacts appropriately 90% of the time - Needs monitoring or encouragement for participation or interaction.  Function - Problem Solving Problem solving assist level: Solves basic 50 - 74% of the time/requires cueing 25 - 49% of the time  Function - Memory Memory assistive device: Other (Comment)(Calendar ) Memory assist level: Recognizes or recalls 25 - 49% of the time/requires cueing 50 - 75% of the time Patient normally able to recall (first 3 days only): That he or she is in a hospital  Medical Problem List and Plan: 1.Decreased functional mobilitysecondary to left frontal intraparenchymal hemorrhage with  recent deep brain stimulator for tremor.    Cont CIR   WHO/PRAFO in place 2. DVT Prophylaxis/Anticoagulation: SCDs. Monitor for any signs of DVT 3. Pain Management:Neurontin 300 mg 3 times a day, Tylenol as needed 4. Mood:Provide emotional support 5. Neuropsych: This patientisnot capable of making decisions onhisown behalf.   limit neuro-sedating meds when possible 6. Skin/Wound Care:Routine skin checks 7. Fluids/Electrolytes/Nutrition:Routine I&O's 8.Hypertension.    Cozaar 100 mg daily, decreased to 50 on 2/5   Lasix  20 mg daily d/ced on 2/5.   Propranolol 80 BID, decreased to 40 BID on 2/15 Vitals:   06/01/17 2111 06/02/17 0519  BP: (!) 138/54 140/60  Pulse: 68 70  Resp: 20 18  Temp: 98.5 F (36.9 C) 98.5 F (36.9 C)  SpO2: 98% 98%     -good control 9.CAD with stenting/CABG. No chest pain or shortness of breath 10.Hyperlipidemia. Lipitor,Lovaza 11. Tremors:   mysoline and inderal ongoing   simulator not operational yet, no plans to initiate in near future after discussion with Neuro 12.  Dysphagia   Advanced to D3 thins    Advance diet as tolerated   CXR reviewed, unremarkable for acute process 13.  Hypoalb- Prostat 14. Prediabetes   Diet change to carb modified, CBGs and SSI started on 2/6  Unable to start metformin due to renal function and ?ability to maintain PO fluids   Amaryl started on 2/12   Improving control 15. ABLA   Hb 11.9 on 2/11   Labs ordered for Monday   Cont to monitor 16. Confusion  Multifactorial due hypotension and sleep/wake changes and intracranial bleed/aphasia   CXR reviewed, unremarkable for acute changes   UA unremarkable, urine culture NG 17. Sleep disturbance with agitation  Trazodone 50 qhs scheduled on 2/5, changed to Seroquel 12.5 on 2/7, increased to 25 on 2/12, decreased back to 12.5 due to lethargy on 2/14   Melatonin started on 2/11, increased on 2/14   Risperdal PRN   Improving, but labile at times   Educated wife  on environment modifications 18. Spastic hemiplegia  Baclofen 5 TID started on 2/7  Cont ROM  Cont bracing 19. AKI  Cr 1.30 on 2/15   Encourage fluids  IVF started on 2/14, changed to 0.45% on 2/15---continue   Labs pending for today? 20.  Neurogenic bladder   Flomax increased to 0.8 on 2/11   Bethanechol started on 2/12     but req caths still   UCX with 100k "undentified organism"---begin empiric keflex given ongoing retention. Await UCS  LOS (Days) 15 A FACE TO FACE EVALUATION WAS PERFORMED  Atalia Litzinger T 06/02/2017, 11:41 AM

## 2017-06-02 NOTE — Progress Notes (Signed)
Occupational Therapy Session Note  Patient Details  Name: Brendan [name] MRN: [id number] Date of Birth: 05/24/1940  Today's Date: 06/02/2017 OT Individual Time: 1345-1430 OT Individual Time Calculation (min): 45 min    Short Term Goals: Week 2:  OT Short Term Goal 1 (Week 2): Pt will complete toilet transfer with Mod A 1 helper OT Short Term Goal 2 (Week 2): Pt will scan to the Rt for 1 ADL item with mod vcs OT Short Term Goal 3 (Week 2): Pt will complete UB dressing with Mod assist OT Short Term Goal 4 (Week 2): Pt will complete bathing with mod assist and min cues for initiation/sequencing  Skilled Therapeutic Interventions/Progress Updates:    Treatment session with focus on visual scanning to Rt, sit <> stand, standing balance, and self-care retraining.  Pt received from nursing staff post I/O cath.  Completed bed mobility with mod assist, donned pants with assist from therapist to thread pant legs in sitting.  Pt able to complete sit > stand from EOB with min assist and attempted to pull left side of pants up, however required assist to pull pants over Rt hip.  Completed UB dressing by donning button up shirt over head like pull over with improved sequencing and success.  Transferred bed > w/c with mod assist via squat pivot.  Engaged in sit > stand in therapy gym at mirror to provide visual feedback for midline orientation and upright posture.  Engaged in visual scanning activity in standing with pt able to locate items in Rt visual field with increased success and min cues.  As fatigue increased, pt demonstrating increased Rt lean in standing, however able to correct with mod multimodal cues.  Noted improved RUE ROM with pt able to reach for parallel bars in sitting, achieving nearly 90* shoulder flexion.  Pt demonstrating improved attention and initiation this session.   Therapy Documentation Precautions:  Precautions Precautions: Fall Precaution Comments: Lt gaze preference, Rt hemi,  aphasic, R inattention Restrictions Weight Bearing Restrictions: No General:   Vital Signs: Therapy Vitals Temp: 98.4 F (36.9 C) Temp Source: Oral Pulse Rate: (!) 58 Resp: 18 BP: (!) 126/53 Patient Position (if appropriate): Lying Oxygen Therapy SpO2: 99 % O2 Device: Not Delivered Pain:  Pt with no c/o pain  See Function Navigator for Current Functional Status.   Therapy/Group: Individual Therapy  Rosalio LoudHOXIE, Anniebelle Devore 06/02/2017, 3:54 PM

## 2017-06-03 LAB — GLUCOSE, CAPILLARY
Glucose-Capillary: 109 mg/dL — ABNORMAL HIGH (ref 65–99)
Glucose-Capillary: 117 mg/dL — ABNORMAL HIGH (ref 65–99)
Glucose-Capillary: 129 mg/dL — ABNORMAL HIGH (ref 65–99)
Glucose-Capillary: 130 mg/dL — ABNORMAL HIGH (ref 65–99)

## 2017-06-03 NOTE — Progress Notes (Signed)
Subjective/Complaints: Patient lying in bed.  About to be catheterized.  Denies any new problems.  He says he slept pretty well.n  ROS: pt denies nausea, vomiting, diarrhea, cough, shortness of breath or chest pain    Objective: Vital Signs: Blood pressure (!) 112/50, pulse (!) 59, temperature 97.8 F (36.6 C), temperature source Oral, resp. rate 18, height '5\' 9"'  (1.753 m), weight 83.9 kg (184 lb 15.5 oz), SpO2 98 %. No results found. Results for orders placed or performed during the hospital encounter of 05/18/17 (from the past 72 hour(s))  Glucose, capillary     Status: Abnormal   Collection Time: 05/31/17 12:10 PM  Result Value Ref Range   Glucose-Capillary 133 (H) 65 - 99 mg/dL  Urinalysis, Routine w reflex microscopic     Status: None   Collection Time: 05/31/17  2:19 PM  Result Value Ref Range   Color, Urine YELLOW YELLOW   APPearance CLEAR CLEAR   Specific Gravity, Urine 1.009 1.005 - 1.030   pH 5.0 5.0 - 8.0   Glucose, UA NEGATIVE NEGATIVE mg/dL   Hgb urine dipstick NEGATIVE NEGATIVE   Bilirubin Urine NEGATIVE NEGATIVE   Ketones, ur NEGATIVE NEGATIVE mg/dL   Protein, ur NEGATIVE NEGATIVE mg/dL   Nitrite NEGATIVE NEGATIVE   Leukocytes, UA NEGATIVE NEGATIVE    Comment: Performed at [facility] [address], [place], [place] [address]  Glucose, capillary     Status: Abnormal   Collection Time: 05/31/17  4:45 PM  Result Value Ref Range   Glucose-Capillary 105 (H) 65 - 99 mg/dL  Glucose, capillary     Status: Abnormal   Collection Time: 05/31/17  9:01 PM  Result Value Ref Range   Glucose-Capillary 159 (H) 65 - 99 mg/dL  Basic metabolic panel     Status: Abnormal   Collection Time: 06/01/17  4:01 AM  Result Value Ref Range   Sodium 137 135 - 145 mmol/L   Potassium 4.6 3.5 - 5.1 mmol/L   Chloride 104 101 - 111 mmol/L   CO2 24 22 - 32 mmol/L   Glucose, Bld 139 (H) 65 - 99 mg/dL   BUN 32 (H) 6 - 20 mg/dL   Creatinine, Ser 1.30 (H) 0.61 - 1.24 mg/dL   Calcium 8.8 (L) 8.9 - 10.3 mg/dL   GFR calc non Af Amer 52 (L) >60 mL/min   GFR calc Af Amer >60 >60 mL/min    Comment: (NOTE) The eGFR has been calculated using the CKD EPI equation. This calculation has not been validated in all clinical situations. eGFR's persistently <60 mL/min signify possible Chronic Kidney Disease.    Anion gap 9 5 - 15    Comment: Performed at [facility] [address], [place], [place] [address]  Glucose, capillary     Status: Abnormal   Collection Time: 06/01/17  6:53 AM  Result Value Ref Range   Glucose-Capillary 123 (H) 65 - 99 mg/dL   Comment 1 Notify RN   Urine Culture     Status: Abnormal (Preliminary result)   Collection Time: 06/01/17  2:19 PM  Result Value Ref Range   Specimen Description URINE, CATHETERIZED    Special Requests NONE    Culture (A)     >=100,000 COLONIES/mL ENTEROCOCCUS FAECALIS SUSCEPTIBILITIES TO FOLLOW Performed at [facility], [place] [address] [place], [place] [address]    Report Status PENDING   Glucose, capillary     Status: Abnormal   Collection Time: 06/01/17  9:07 PM  Result Value Ref Range   Glucose-Capillary 153 (H) 65 - 99 mg/dL  Glucose, capillary     Status: Abnormal   Collection Time: 06/02/17  6:47 AM  Result Value Ref Range   Glucose-Capillary 120 (H) 65 - 99 mg/dL  Glucose, capillary     Status: Abnormal   Collection Time: 06/02/17 11:39 AM  Result Value Ref Range   Glucose-Capillary 198 (H) 65 - 99 mg/dL  Glucose, capillary     Status: None   Collection Time: 06/02/17  4:36 PM  Result Value Ref Range   Glucose-Capillary 95 65 - 99 mg/dL  Glucose, capillary     Status: Abnormal   Collection Time: 06/02/17  8:43 PM  Result Value Ref Range   Glucose-Capillary 115 (H) 65 - 99 mg/dL  Glucose, capillary     Status: Abnormal   Collection Time: 06/03/17  6:36 AM  Result Value Ref Range   Glucose-Capillary 109 (H) 65 - 99 mg/dL    General: NAD. Well-developed.  HEENT: Normocephalic,  atraumatic. Cardio: RRR without murmur. No JVD  . Resp: CTA Bilaterally without wheezes or rales. Normal effort   GI: BS positive and ND Musc/Skel:  No edema, no tenderness. Neuro: Alert, fair basic spontaneous speech  Expressive >> Receptive aphasia, follows simple commands Motor:  RUE/RLE: 0/5 proximal to distal (unchanged), increased tone UE and LE (unchanged) LUE tremor Dysarthria Skin:   Intact. Warm and dry   Assessment/Plan: 1. Functional deficits secondary to left frontal parietal intraparenchymal hemorrhage with right hemiparesis, aphasia and cognitive deficits which require 3+ hours per day of interdisciplinary therapy in a comprehensive inpatient rehab setting. Physiatrist is providing close team supervision and 24 hour management of active medical problems listed below. Physiatrist and rehab team continue to assess barriers to discharge/monitor patient progress toward functional and medical goals. FIM: Function - Bathing Position: Wheelchair/chair at sink Body parts bathed by patient: Left arm, Chest, Abdomen, Right upper leg, Left upper leg Body parts bathed by helper: Buttocks, Back, Right arm, Front perineal area Bathing not applicable: Left lower leg, Right lower leg Assist Level: 2 helpers(sit > stand level)  Function- Upper Body Dressing/Undressing What is the patient wearing?: Pull over shirt/dress Pull over shirt/dress - Perfomed by patient: Thread/unthread left sleeve, Pull shirt over trunk Pull over shirt/dress - Perfomed by helper: Thread/unthread right sleeve, Put head through opening Button up shirt - Perfomed by patient: Thread/unthread left sleeve Button up shirt - Perfomed by helper: Thread/unthread right sleeve, Pull shirt around back, Button/unbutton shirt Assist Level: (Mod assist) Function - Lower Body Dressing/Undressing What is the patient wearing?: Pants Position: Sitting EOB Pants- Performed by patient: Thread/unthread left pants leg Pants-  Performed by helper: Thread/unthread right pants leg, Pull pants up/down, Thread/unthread left pants leg Non-skid slipper socks- Performed by helper: Don/doff right sock, Don/doff left sock TED Hose - Performed by helper: Don/doff right TED hose, Don/doff left TED hose Assist for footwear: Dependant Assist for lower body dressing: (Total assist)  Function - Toileting Toileting activity did not occur: No continent bowel/bladder event Toileting steps completed by patient: Performs perineal hygiene Toileting steps completed by helper: Adjust clothing prior to toileting, Adjust clothing after toileting Toileting Assistive Devices: Grab bar or rail Assist level: Two helpers(per [employer], NT report)  Function - Air cabin crew transfer activity did not occur: (not attempted) Assist level to toilet: 2 helpers(per Thea Alken, NT report)  Function - Chair/bed transfer Chair/bed transfer method: Squat pivot Chair/bed transfer assist level:  Moderate assist (Pt 50 - 74%/lift or lower) Chair/bed transfer assistive device: Armrests Chair/bed transfer details: Verbal cues for technique, Verbal cues for precautions/safety, Verbal cues for sequencing, Verbal cues for safe use of DME/AE  Function - Locomotion: Wheelchair Type: Manual Wheelchair activity did not occur: Safety/medical concerns Max wheelchair distance: 100 ft Assist Level: Touching or steadying assistance (Pt > 75%) Wheel 50 feet with 2 turns activity did not occur: Safety/medical concerns Assist Level: Touching or steadying assistance (Pt > 75%) Wheel 150 feet activity did not occur: Safety/medical concerns Function - Locomotion: Ambulation Assistive device: Walker-platform Max distance: 3 Assist level: Maximal assist (Pt 25 - 49%) Walk 10 feet activity did not occur: Safety/medical concerns Assist level: Moderate assist (Pt 50 - 74%) Walk 50 feet with 2 turns activity did not occur: Safety/medical concerns Assist  level: Moderate assist (Pt 50 - 74%) Walk 150 feet activity did not occur: Safety/medical concerns Walk 10 feet on uneven surfaces activity did not occur: Safety/medical concerns  Function - Comprehension Comprehension: Auditory Comprehension assist level: Understands basic 90% of the time/cues < 10% of the time  Function - Expression Expression: Verbal Expression assist level: Expresses basic 75 - 89% of the time/requires cueing 10 - 24% of the time. Needs helper to occlude trach/needs to repeat words.  Function - Social Interaction Social Interaction assist level: Interacts appropriately 90% of the time - Needs monitoring or encouragement for participation or interaction.  Function - Problem Solving Problem solving assist level: Solves basic 50 - 74% of the time/requires cueing 25 - 49% of the time  Function - Memory Memory assistive device: Other (Comment)(Calendar ) Memory assist level: Recognizes or recalls 25 - 49% of the time/requires cueing 50 - 75% of the time Patient normally able to recall (first 3 days only): That he or she is in a hospital  Medical Problem List and Plan: 1.Decreased functional mobilitysecondary to left frontal intraparenchymal hemorrhage with recent deep brain stimulator for tremor.    Cont CIR   WHO/PRAFO in place 2. DVT Prophylaxis/Anticoagulation: SCDs. Monitor for any signs of DVT 3. Pain Management:Neurontin 300 mg 3 times a day, Tylenol as needed 4. Mood:Provide emotional support 5. Neuropsych: This patientisnot capable of making decisions onhisown behalf.   limit neuro-sedating meds when possible 6. Skin/Wound Care:Routine skin checks 7. Fluids/Electrolytes/Nutrition:Routine I&O's 8.Hypertension.    Cozaar 100 mg daily, decreased to 50 on 2/5   Lasix 20 mg daily d/ced on 2/5.   Propranolol 80 BID, decreased to 40 BID on 2/15 Vitals:   06/02/17 2040 06/03/17 0515  BP: 128/61 (!) 112/50  Pulse: 64 (!) 59  Resp:  18  Temp:   97.8 F (36.6 C)  SpO2:  98%     -good control 9.CAD with stenting/CABG. No chest pain or shortness of breath 10.Hyperlipidemia. Lipitor,Lovaza 11. Tremors:   mysoline and inderal ongoing   simulator not operational yet, no plans to initiate in near future after discussion with Neuro 12.  Dysphagia   Advanced to D3 thins    Advance diet as tolerated   CXR reviewed, unremarkable for acute process 13.  Hypoalb- Prostat 14. Prediabetes   Diet change to carb modified, CBGs and SSI started on 2/6  Unable to start metformin due to renal function and ?ability to maintain PO fluids   Amaryl started on 2/12   Improved control 15. ABLA   Hb 11.9 on 2/11   Labs ordered for Monday   Cont to monitor 16. Confusion  Multifactorial due hypotension  and sleep/wake changes and intracranial bleed/aphasia   CXR reviewed, unremarkable for acute changes   UA unremarkable, urine culture NG 17. Sleep disturbance with agitation  Trazodone 50 qhs scheduled on 2/5, changed to Seroquel 12.5 on 2/7, increased to 25 on 2/12, decreased back to 12.5 due to lethargy on 2/14   Melatonin started on 2/11, increased on 2/14   Risperdal PRN   Improving, but labile at times   Educated wife on environment modifications 18. Spastic hemiplegia  Baclofen 5 TID started on 2/7  Cont ROM  Cont bracing 19. AKI  Cr 1.30 on 2/15   Encourage fluids  IVF started on 2/14, changed to 0.45% on 2/15---continue   Check labs monday 20.  Neurogenic bladder   Flomax increased to 0.8 on 2/11   Bethanechol started on 2/12 without much impact     but req caths still   UCX with 100k "undentified organism"-which has now been identified as enterococcus    -continue empiric keflex    LOS (Days) 16 A FACE TO FACE EVALUATION WAS PERFORMED  SWARTZ,ZACHARY T 06/03/2017, 8:47 AM

## 2017-06-04 ENCOUNTER — Ambulatory Visit: Payer: PPO | Admitting: Neurology

## 2017-06-04 ENCOUNTER — Inpatient Hospital Stay (HOSPITAL_COMMUNITY): Payer: PPO | Admitting: Occupational Therapy

## 2017-06-04 ENCOUNTER — Inpatient Hospital Stay (HOSPITAL_COMMUNITY): Payer: PPO | Admitting: Physical Therapy

## 2017-06-04 ENCOUNTER — Inpatient Hospital Stay (HOSPITAL_COMMUNITY): Payer: PPO | Admitting: Speech Pathology

## 2017-06-04 LAB — BASIC METABOLIC PANEL
Anion gap: 7 (ref 5–15)
BUN: 15 mg/dL (ref 6–20)
CO2: 25 mmol/L (ref 22–32)
Calcium: 8.7 mg/dL — ABNORMAL LOW (ref 8.9–10.3)
Chloride: 108 mmol/L (ref 101–111)
Creatinine, Ser: 1.07 mg/dL (ref 0.61–1.24)
GFR calc Af Amer: >60 mL/min (ref >60)
GFR calc non Af Amer: >60 mL/min (ref >60)
Glucose, Bld: 117 mg/dL — ABNORMAL HIGH (ref 65–99)
Potassium: 4.6 mmol/L (ref 3.5–5.1)
Sodium: 140 mmol/L (ref 135–145)

## 2017-06-04 LAB — CBC WITH DIFFERENTIAL/PLATELET
Basophils Absolute: 0 10*3/uL (ref 0.0–0.1)
Basophils Relative: 0 %
Eosinophils Absolute: 0.1 10*3/uL (ref 0.0–0.7)
Eosinophils Relative: 3 %
HCT: 30.2 % — ABNORMAL LOW (ref 39.0–52.0)
Hemoglobin: 9.8 g/dL — ABNORMAL LOW (ref 13.0–17.0)
Lymphocytes Relative: 33 %
Lymphs Abs: 1.5 10*3/uL (ref 0.7–4.0)
MCH: 32.3 pg (ref 26.0–34.0)
MCHC: 32.5 g/dL (ref 30.0–36.0)
MCV: 99.7 fL (ref 78.0–100.0)
Monocytes Absolute: 0.3 10*3/uL (ref 0.1–1.0)
Monocytes Relative: 6 %
Neutro Abs: 2.8 10*3/uL (ref 1.7–7.7)
Neutrophils Relative %: 58 %
Platelets: 126 10*3/uL — ABNORMAL LOW (ref 150–400)
RBC: 3.03 MIL/uL — ABNORMAL LOW (ref 4.22–5.81)
RDW: 13.8 % (ref 11.5–15.5)
WBC: 4.7 10*3/uL (ref 4.0–10.5)

## 2017-06-04 LAB — GLUCOSE, CAPILLARY
Glucose-Capillary: 115 mg/dL — ABNORMAL HIGH (ref 65–99)
Glucose-Capillary: 210 mg/dL — ABNORMAL HIGH (ref 65–99)
Glucose-Capillary: 74 mg/dL (ref 65–99)
Glucose-Capillary: 128 mg/dL — ABNORMAL HIGH (ref 65–99)

## 2017-06-04 LAB — URINE CULTURE: Culture: >=100000 — AB

## 2017-06-04 MED ORDER — AMOXICILLIN 500 MG PO CAPS
500.0000 mg | ORAL_CAPSULE | Freq: Two times a day (BID) | ORAL | Status: DC
  Administered 2017-06-04 – 2017-06-09 (×10): 500 mg via ORAL
  Filled 2017-06-04 (×10): qty 1

## 2017-06-04 NOTE — Progress Notes (Signed)
Subjective/Complaints: Pt seen lying in bed this AM.  Wife at bedside.  She states pt slept well overnight.  Pt confirms.  He spontaneous speech has improvement this with increased awareness.   ROS: Denies nausea, vomiting, diarrhea, shortness of breath or chest pain    Objective: Vital Signs: Blood pressure (!) 129/56, pulse (!) 59, temperature 97.8 F (36.6 C), temperature source Oral, resp. rate 18, height _0  (1.753 m), weight 83.9 kg (184 lb 15.5 oz), SpO2 99 %. No results found. Results for orders placed or performed during the hospital encounter of 05/18/17 (from the past 72 hour(s))  Urine Culture     Status: Abnormal (Preliminary result)   Collection Time: 06/01/17  2:19 PM  Result Value Ref Range   Specimen Description URINE, CATHETERIZED    Special Requests NONE    Culture (A)     >=100,000 COLONIES/mL ENTEROCOCCUS FAECALIS SUSCEPTIBILITIES TO FOLLOW Performed at [facility], [address], [place], [place] [address]    Report Status PENDING   Glucose, capillary     Status: Abnormal   Collection Time: 06/01/17  9:07 PM  Result Value Ref Range   Glucose-Capillary 153 (H) 65 - 99 mg/dL  Glucose, capillary     Status: Abnormal   Collection Time: 06/02/17  6:47 AM  Result Value Ref Range   Glucose-Capillary 120 (H) 65 - 99 mg/dL  Glucose, capillary     Status: Abnormal   Collection Time: 06/02/17 11:39 AM  Result Value Ref Range   Glucose-Capillary 198 (H) 65 - 99 mg/dL  Glucose, capillary     Status: None   Collection Time: 06/02/17  4:36 PM  Result Value Ref Range   Glucose-Capillary 95 65 - 99 mg/dL  Glucose, capillary     Status: Abnormal   Collection Time: 06/02/17  8:43 PM  Result Value Ref Range   Glucose-Capillary 115 (H) 65 - 99 mg/dL  Glucose, capillary     Status: Abnormal   Collection Time: 06/03/17  6:36 AM  Result Value Ref Range   Glucose-Capillary 109 (H) 65 - 99 mg/dL  Glucose, capillary     Status: Abnormal   Collection Time:  06/03/17 11:24 AM  Result Value Ref Range   Glucose-Capillary 117 (H) 65 - 99 mg/dL  Glucose, capillary     Status: Abnormal   Collection Time: 06/03/17  4:23 PM  Result Value Ref Range   Glucose-Capillary 129 (H) 65 - 99 mg/dL  Glucose, capillary     Status: Abnormal   Collection Time: 06/03/17  8:47 PM  Result Value Ref Range   Glucose-Capillary 130 (H) 65 - 99 mg/dL  Basic metabolic panel     Status: Abnormal   Collection Time: 06/04/17  4:00 AM  Result Value Ref Range   Sodium 140 135 - 145 mmol/L   Potassium 4.6 3.5 - 5.1 mmol/L   Chloride 108 101 - 111 mmol/L   CO2 25 22 - 32 mmol/L   Glucose, Bld 117 (H) 65 - 99 mg/dL   BUN 15 6 - 20 mg/dL   Creatinine, Ser 1.07 0.61 - 1.24 mg/dL   Calcium 8.7 (L) 8.9 - 10.3 mg/dL   GFR calc non Af Amer >60 >60 mL/min   GFR calc Af Amer >60 >60 mL/min    Comment: (NOTE) The eGFR has been calculated using the CKD EPI equation. This calculation has not been validated in all clinical situations. eGFR's persistently <60 mL/min signify possible Chronic Kidney Disease.  Anion gap 7 5 - 15    Comment: Performed at [facility] [address], [place], [place] [address]  CBC with Differential/Platelet     Status: Abnormal   Collection Time: 06/04/17  4:00 AM  Result Value Ref Range   WBC 4.7 4.0 - 10.5 K/uL   RBC 3.03 (L) 4.22 - 5.81 MIL/uL   Hemoglobin 9.8 (L) 13.0 - 17.0 g/dL   HCT 30.2 (L) 39.0 - 52.0 %   MCV 99.7 78.0 - 100.0 fL   MCH 32.3 26.0 - 34.0 pg   MCHC 32.5 30.0 - 36.0 g/dL   RDW 13.8 11.5 - 15.5 %   Platelets 126 (L) 150 - 400 K/uL   Neutrophils Relative % 58 %   Neutro Abs 2.8 1.7 - 7.7 K/uL   Lymphocytes Relative 33 %   Lymphs Abs 1.5 0.7 - 4.0 K/uL   Monocytes Relative 6 %   Monocytes Absolute 0.3 0.1 - 1.0 K/uL   Eosinophils Relative 3 %   Eosinophils Absolute 0.1 0.0 - 0.7 K/uL   Basophils Relative 0 %   Basophils Absolute 0.0 0.0 - 0.1 K/uL    Comment: Performed at [facility] [address] [place], [place] [address]  Glucose, capillary     Status: Abnormal   Collection Time: 06/04/17  6:05 AM  Result Value Ref Range   Glucose-Capillary 115 (H) 65 - 99 mg/dL    General: NAD. Well-developed.  HEENT: Normocephalic, atraumatic. Cardio: RRR. No JVD. Resp: CTA Bilaterally. Normal effort   GI: BS positive and ND Musc/Skel:  No edema, no tenderness. Neuro: Alert, improving spontaneous speech  Expressive >> Receptive aphasia Motor:  RUE/RLE: 0/5 proximal to distal (unchanged), increased tone UE and LE (stable) LUE tremor Dysarthria Skin:   Intact. Warm and dry   Assessment/Plan: 1. Functional deficits secondary to left frontal parietal intraparenchymal hemorrhage with right hemiparesis, aphasia and cognitive deficits which require 3+ hours per day of interdisciplinary therapy in a comprehensive inpatient rehab setting. Physiatrist is providing close team supervision and 24 hour management of active medical problems listed below. Physiatrist and rehab team continue to assess barriers to discharge/monitor patient progress toward functional and medical goals. FIM: Function - Bathing Position: Wheelchair/chair at sink Body parts bathed by patient: Left arm, Chest, Abdomen, Right upper leg, Left upper leg Body parts bathed by helper: Buttocks, Back, Right arm, Front perineal area Bathing not applicable: Left lower leg, Right lower leg Assist Level: 2 helpers(sit > stand level)  Function- Upper Body Dressing/Undressing What is the patient wearing?: Pull over shirt/dress Pull over shirt/dress - Perfomed by patient: Thread/unthread left sleeve, Pull shirt over trunk Pull over shirt/dress - Perfomed by helper: Thread/unthread right sleeve, Put head through opening Button up shirt - Perfomed by patient: Thread/unthread left sleeve Button up shirt - Perfomed by helper: Thread/unthread right sleeve, Pull shirt around back, Button/unbutton shirt Assist Level: (Mod assist) Function -  Lower Body Dressing/Undressing What is the patient wearing?: Pants Position: Sitting EOB Pants- Performed by patient: Thread/unthread left pants leg Pants- Performed by helper: Thread/unthread right pants leg, Pull pants up/down, Thread/unthread left pants leg Non-skid slipper socks- Performed by helper: Don/doff right sock, Don/doff left sock TED Hose - Performed by helper: Don/doff right TED hose, Don/doff left TED hose Assist for footwear: Dependant Assist for lower body dressing: (Total assist)  Function - Toileting Toileting activity did not occur: No continent bowel/bladder event Toileting steps completed by patient: Performs perineal hygiene Toileting  steps completed by helper: Adjust clothing prior to toileting, Adjust clothing after toileting Toileting Assistive Devices: Grab bar or rail Assist level: Two helpers(per Thea Alken, NT report)  Function - Air cabin crew transfer activity did not occur: (not attempted) Assist level to toilet: 2 helpers(per Thea [employer]ken, NT report)  Function - Chair/bed transfer Chair/bed transfer method: Squat pivot Chair/bed transfer assist level: Moderate assist (Pt 50 - 74%/lift or lower) Chair/bed transfer assistive device: Armrests Chair/bed transfer details: Verbal cues for technique, Verbal cues for precautions/safety, Verbal cues for sequencing, Verbal cues for safe use of DME/AE  Function - Locomotion: Wheelchair Type: Manual Wheelchair activity did not occur: Safety/medical concerns Max wheelchair distance: 100 ft Assist Level: Touching or steadying assistance (Pt > 75%) Wheel 50 feet with 2 turns activity did not occur: Safety/medical concerns Assist Level: Touching or steadying assistance (Pt > 75%) Wheel 150 feet activity did not occur: Safety/medical concerns Function - Locomotion: Ambulation Assistive device: Walker-platform Max distance: 3 Assist level: Maximal assist (Pt 25 - 49%) Walk 10 feet activity did not  occur: Safety/medical concerns Assist level: Moderate assist (Pt 50 - 74%) Walk 50 feet with 2 turns activity did not occur: Safety/medical concerns Assist level: Moderate assist (Pt 50 - 74%) Walk 150 feet activity did not occur: Safety/medical concerns Walk 10 feet on uneven surfaces activity did not occur: Safety/medical concerns  Function - Comprehension Comprehension: Auditory Comprehension assist level: Understands basic 90% of the time/cues < 10% of the time  Function - Expression Expression: Verbal Expression assist level: Expresses basic 75 - 89% of the time/requires cueing 10 - 24% of the time. Needs helper to occlude trach/needs to repeat words.  Function - Social Interaction Social Interaction assist level: Interacts appropriately 90% of the time - Needs monitoring or encouragement for participation or interaction.  Function - Problem Solving Problem solving assist level: Solves basic 50 - 74% of the time/requires cueing 25 - 49% of the time  Function - Memory Memory assistive device: Other (Comment)(Calendar ) Memory assist level: Recognizes or recalls 25 - 49% of the time/requires cueing 50 - 75% of the time Patient normally able to recall (first 3 days only): That he or she is in a hospital  Medical Problem List and Plan: 1.Decreased functional mobilitysecondary to left frontal intraparenchymal hemorrhage with recent deep brain stimulator for tremor.    Cont CIR   WHO/PRAFO 2. DVT Prophylaxis/Anticoagulation: SCDs. Monitor for any signs of DVT 3. Pain Management:Neurontin 300 mg 3 times a day, Tylenol as needed 4. Mood:Provide emotional support 5. Neuropsych: This patientisnot capable of making decisions onhisown behalf.   limit neuro-sedating meds when possible 6. Skin/Wound Care:Routine skin checks 7. Fluids/Electrolytes/Nutrition:Routine I&O's 8.Hypertension.    Cozaar 100 mg daily, decreased to 50 on 2/5   Lasix 20 mg daily d/ced on 2/5.    Propranolol 80 BID, decreased to 40 BID on 2/15 Vitals:   06/03/17 2134 06/04/17 0523  BP: (!) 153/60 (!) 129/56  Pulse: 75 (!) 59  Resp:  18  Temp:  97.8 F (36.6 C)  SpO2:  99%    Labile on 2/18 9.CAD with stenting/CABG. No chest pain or shortness of breath 10.Hyperlipidemia. Lipitor,Lovaza 11. Tremors:   mysoline and inderal ongoing   simulator not operational yet, no plans to initiate in near future after discussion with Neuro 12.  Dysphagia   Advanced to D3 thins    Advance diet as tolerated   CXR reviewed, unremarkable for acute process 13.  Hypoalb- Prostat 14.  Prediabetes   Diet change to carb modified, CBGs and SSI started on 2/6  Unable to start metformin due to renal function and ?ability to maintain PO fluids   Amaryl started on 2/12   Relatively controlled on 2/18 15. ABLA   Hb 9.8 on 2/18   Labs ordered for tomorrow   Cont to monitor 16. Confusion  Multifactorial due hypotension and sleep/wake changes and intracranial bleed/aphasia   CXR reviewed, unremarkable for acute changes   UA unremarkable, urine culture NG 17. Sleep disturbance with agitation  Trazodone 50 qhs scheduled on 2/5, changed to Seroquel 12.5 on 2/7, increased to 25 on 2/12, decreased back to 12.5 due to lethargy on 2/14   Melatonin started on 2/11, increased on 2/14   Risperdal PRN   Improving, but labile at times   Educated wife on environment modifications 18. Spastic hemiplegia  Baclofen 5 TID started on 2/7  Cont ROM  Cont bracing 19. AKI  Cr 1.07 on 2/18   Encourage fluids  IVF qhs started on 2/15, will consider d/cing   Labs ordered for tomorrow 20.  Neurogenic bladder   Flomax increased to 0.8 on 2/11   Bethanechol started on 2/12   UCX with enterococcus, sensitivities pending   Continue empiric keflex    LOS (Days) 17 A FACE TO FACE EVALUATION WAS PERFORMED  Ankit Lorie Phenix 06/04/2017, 8:36 AM

## 2017-06-04 NOTE — Progress Notes (Signed)
Occupational Therapy Weekly Progress Note  Patient Details  Name: Brendan [name] MRN: [id number] Date of Birth: 23-Sep-1940  Beginning of progress report period: May 28, 2017 End of progress report period: June 04, 2017  Today's Date: 06/04/2017 OT Individual Time: 1100-1200 OT Individual Time Calculation (min): 60 min    Patient has met 4 of 4 short term goals.  Pt is making steady progress towards goals.  Pt has demonstrated much improvement since last reporting period. Pt currently requires min-mod assist with transfers depending on level of fatigue and attention to task.  Min assist for stand pivot transfers when fully alert and attentive.  Addressing modified strategies for dressing to decrease burden of care.  Pt is demonstrating improved motor return in RUE, in functional tasks with ability to reach approx 50* shoulder flexion and improving grasp.  Pt will benefit from continued therapy to decrease burden of care and continue family education.  Patient continues to demonstrate the following deficits: muscle weakness, muscle joint tightness and muscle paralysis,decreased cardiorespiratoy endurance,impaired timing and sequencing, abnormal tone, unbalanced muscle activation, motor apraxia, decreased coordination and decreased motor planning,decreased midline orientation, decreased attention to right, right side neglect and decreased motor planning,decreased initiation, decreased attention, decreased awareness, decreased problem solving, decreased safety awareness, decreased memory and delayed processingand decreased sitting balance, decreased standing balance, decreased postural control and hemiplegia and therefore will continue to benefit from skilled OT intervention to enhance overall performance with BADL and Reduce care partner burden.  Patient progressing toward long term goals..  Plan of care revisions: downgraded dynamic standing balance to mod assist and transfers to mod  assist to be more consistent with other goals..  OT Short Term Goals Week 2:  OT Short Term Goal 1 (Week 2): Pt will complete toilet transfer with Mod A 1 helper OT Short Term Goal 1 - Progress (Week 2): Met OT Short Term Goal 2 (Week 2): Pt will scan to the Rt for 1 ADL item with mod vcs OT Short Term Goal 2 - Progress (Week 2): Met OT Short Term Goal 3 (Week 2): Pt will complete UB dressing with Mod assist OT Short Term Goal 3 - Progress (Week 2): Met OT Short Term Goal 4 (Week 2): Pt will complete bathing with mod assist and min cues for initiation/sequencing OT Short Term Goal 4 - Progress (Week 2): Met Week 3:  OT Short Term Goal 1 (Week 3): STG = LTGs due to remaining LOS  Skilled Therapeutic Interventions/Progress Updates:    Treatment session with focus on functional transfers, dynamic standing balance, and RUE NMR.  Pt received on toilet with nurse tech present.  Nursing staff utilizing lift equipment for transfers.  Pt assisted with pulling pants up on Lt side while standing in [place].  Engaged in transfer training bed <> BSC with pt able to complete stand pivot transfer with RW with min assist.  Discussed recommendation for [facility] either next to bed at home or over the toilet depending on level of assist and arousal.  Stand pivot transfer bed > w/c with min assist with RW.  Towel glides on table with visual target to increase activation and attention to task.  Engaged in sit > stand at high/low table with focus on transitional movements, standing balance, and visual scanning to Rt.  Engaged in card matching activity incorporating horizontal and vertical scanning while challenging weight shifting to obtain cards.  Pt able to reach approx 50* shoulder flexion with RUE to knock over pegs.  Pt  demonstrating improved visual scanning to Rt as well as decreased Rt lean this session.  Returned to room and left upright in w/c with quick release belt and call bell in reach.  Therapy  Documentation Precautions:  Precautions Precautions: Fall Precaution Comments: Lt gaze preference, Rt hemi, aphasic, R inattention Restrictions Weight Bearing Restrictions: No General:   Vital Signs: Therapy Vitals Pulse Rate: 63 BP: 131/60 Pain:  Pt with no c/o pain  See Function Navigator for Current Functional Status.   Therapy/Group: Individual Therapy  Simonne Come 06/04/2017, 12:09 PM

## 2017-06-04 NOTE — Progress Notes (Signed)
Speech Language Pathology Daily Session Note  Patient Details  Name: [name] W Myler MRN: [id number] Date of Birth: 01/04/1941  Today's Date: 06/04/2017 SLP Individual Time: 1200-1300 SLP Individual Time Calculation (min): 60 min  Short Term Goals: Week 3: SLP Short Term Goal 1 (Week 3): Pt will sustain attention to basic familiar task  for ~ 15 minutes with Min A cues.  SLP Short Term Goal 2 (Week 3): Pt will convey immediate needs/wants to caregivers with min assist verbal cues.   SLP Short Term Goal 3 (Week 3): Pt will increase speech intelligibility at the phrase level to achieve ~ 75% intelligibility given Min A cues for use of speech intelligibility strategies.  SLP Short Term Goal 4 (Week 3): Pt will complete basic familiar problem solving tasks related to ADLs with Mod A cues.  SLP Short Term Goal 5 (Week 3): Pt will consume therapeutic trials of dys 3 textures with minimal overt s/s of aspiration and supervision cues for use of compensatory swallow strategies.   Skilled Therapeutic Interventions: Skilled treatment session focused on dysphagia and cognition goals. SLP facilitated session by providing skilled observation of pt consuming dysphagia 3 lunch tray with thin liquids. Pt with mild throat clear x 1 throughout consumption. Pt with good oral clearing. SLP further facilitated session by providing Mod A cues to initiate tray set up with his left hand and Mod A cues to locate items on the right of his tray. Pt was supervision when sustaining his attention to task in mildly distracting environment. Pt was returned to room, left upright in wheelchair with safety belt donned and all needs within reach. Continue per current plan of care.      Function:  Eating Eating   Modified Consistency Diet: Yes Eating Assist Level: Set up assist for;Supervision or verbal cues   Eating Set Up Assist For: Opening containers Helper Scoops Food on Utensil: Occasionally     Cognition Comprehension  Comprehension assist level: Understands basic 75 - 89% of the time/ requires cueing 10 - 24% of the time  Expression   Expression assist level: Expresses basic 75 - 89% of the time/requires cueing 10 - 24% of the time. Needs helper to occlude trach/needs to repeat words.  Social Interaction Social Interaction assist level: Interacts appropriately 90% of the time - Needs monitoring or encouragement for participation or interaction.  Problem Solving Problem solving assist level: Solves basic 50 - 74% of the time/requires cueing 25 - 49% of the time  Memory Memory assist level: Recognizes or recalls 50 - 74% of the time/requires cueing 25 - 49% of the time    Pain    Therapy/Group: Individual Therapy  Kegan Shepardson 06/04/2017, 3:11 PM

## 2017-06-04 NOTE — Consult Note (Signed)
   [facility]   06/04/2017  [name] 09/10/1940 [id number]    [facility].  Mr. Brendan RoughenKirkman remains on inpatient rehab unit. Spoke with inpatient rehab social worker Valentina Gu([name]). Discharge date slated for February 23rd. [facility] post [facility].     Raiford NobleAtika Pattricia Weiher, MSN-Ed, RN,BSN [facility] Hospital Liaison [phone]

## 2017-06-04 NOTE — Progress Notes (Signed)
Physical Therapy Note  Patient Details  Name: [name] W Claxton MRN: [id number] Date of Birth: 01/10/1941 Today's Date: 06/04/2017    Time: 1400-1508 68 minutes  1:1 No c/o pain. Session focused on pt/family education with hands on training for pt's wife.  Pt/wife performed at min A level: bed mobility, furniture transfers, bed<>w/c transfers, simulated car transfer.  Pt/wife plan to practice real car transfer tomorrow.  gait with RW with PT 120' x 2 with min A.  Stair negotiation x 4 stairs with 1 handrail with mod A.  Pt with much improved affect, posture and activity tolerance today.  Discussed home set up, safety and d/c recommendations with pt/wife who both verbalize understanding.   Deveney Bayon 06/04/2017, 3:12 PM

## 2017-06-05 ENCOUNTER — Inpatient Hospital Stay (HOSPITAL_COMMUNITY): Payer: PPO | Admitting: Occupational Therapy

## 2017-06-05 ENCOUNTER — Inpatient Hospital Stay (HOSPITAL_COMMUNITY): Payer: PPO | Admitting: Physical Therapy

## 2017-06-05 ENCOUNTER — Inpatient Hospital Stay (HOSPITAL_COMMUNITY): Payer: PPO | Admitting: Speech Pathology

## 2017-06-05 DIAGNOSIS — R7309 Other abnormal glucose: Secondary | ICD-10-CM

## 2017-06-05 DIAGNOSIS — D696 Thrombocytopenia, unspecified: Secondary | ICD-10-CM

## 2017-06-05 DIAGNOSIS — R451 Restlessness and agitation: Secondary | ICD-10-CM

## 2017-06-05 LAB — CBC WITH DIFFERENTIAL/PLATELET
Basophils Absolute: 0 10*3/uL (ref 0.0–0.1)
Basophils Relative: 0 %
Eosinophils Absolute: 0.1 10*3/uL (ref 0.0–0.7)
Eosinophils Relative: 2 %
HCT: 31.2 % — ABNORMAL LOW (ref 39.0–52.0)
Hemoglobin: 10.2 g/dL — ABNORMAL LOW (ref 13.0–17.0)
Lymphocytes Relative: 16 %
Lymphs Abs: 1.1 10*3/uL (ref 0.7–4.0)
MCH: 33 pg (ref 26.0–34.0)
MCHC: 32.7 g/dL (ref 30.0–36.0)
MCV: 101 fL — ABNORMAL HIGH (ref 78.0–100.0)
Monocytes Absolute: 0.5 10*3/uL (ref 0.1–1.0)
Monocytes Relative: 7 %
Neutro Abs: 5.4 10*3/uL (ref 1.7–7.7)
Neutrophils Relative %: 75 %
Platelets: 115 10*3/uL — ABNORMAL LOW (ref 150–400)
RBC: 3.09 MIL/uL — ABNORMAL LOW (ref 4.22–5.81)
RDW: 14.2 % (ref 11.5–15.5)
WBC: 7.1 10*3/uL (ref 4.0–10.5)

## 2017-06-05 LAB — BASIC METABOLIC PANEL
Anion gap: 11 (ref 5–15)
BUN: 13 mg/dL (ref 6–20)
CO2: 23 mmol/L (ref 22–32)
Calcium: 9.1 mg/dL (ref 8.9–10.3)
Chloride: 107 mmol/L (ref 101–111)
Creatinine, Ser: 1.12 mg/dL (ref 0.61–1.24)
GFR calc Af Amer: >60 mL/min (ref >60)
GFR calc non Af Amer: >60 mL/min (ref >60)
Glucose, Bld: 116 mg/dL — ABNORMAL HIGH (ref 65–99)
Potassium: 4.6 mmol/L (ref 3.5–5.1)
Sodium: 141 mmol/L (ref 135–145)

## 2017-06-05 LAB — GLUCOSE, CAPILLARY
Glucose-Capillary: 108 mg/dL — ABNORMAL HIGH (ref 65–99)
Glucose-Capillary: 163 mg/dL — ABNORMAL HIGH (ref 65–99)
Glucose-Capillary: 121 mg/dL — ABNORMAL HIGH (ref 65–99)
Glucose-Capillary: 129 mg/dL — ABNORMAL HIGH (ref 65–99)

## 2017-06-05 MED ORDER — BACLOFEN 10 MG PO TABS
10.0000 mg | ORAL_TABLET | Freq: Every day | ORAL | Status: DC
  Administered 2017-06-05 – 2017-06-08 (×4): 10 mg via ORAL
  Filled 2017-06-05 (×4): qty 1

## 2017-06-05 MED ORDER — BACLOFEN 5 MG HALF TABLET
5.0000 mg | ORAL_TABLET | Freq: Two times a day (BID) | ORAL | Status: DC
  Administered 2017-06-05 – 2017-06-09 (×8): 5 mg via ORAL
  Filled 2017-06-05 (×8): qty 1

## 2017-06-05 NOTE — Progress Notes (Signed)
Patient ID: Brendan BoJerry W Buehl, male   DOB: 02/26/1941, 77 y.o.   MRN: [id number] Social visit..mutual patient... Alert, smiling, working with Therapist. Transfers from bed to w/c. Continues to require frequent reminders/cues to remain focused and safe. Discharge planned for 2/23. Dr. Venetia MaxonStern is writing a letter to facilitate POA for wife and daughter.

## 2017-06-05 NOTE — Progress Notes (Signed)
Speech Language Pathology Daily Session Note  Patient Details  Name: Brendan [name] Forinash MRN: [id number] Date of Birth: 06/16/1940  Today's Date: 06/05/2017 SLP Individual Time: 1118-1201 SLP Individual Time Calculation (min): 43 min  Short Term Goals: Week 3: SLP Short Term Goal 1 (Week 3): Pt will sustain attention to basic familiar task  for ~ 15 minutes with Min A cues.  SLP Short Term Goal 2 (Week 3): Pt will convey immediate needs/wants to caregivers with min assist verbal cues.   SLP Short Term Goal 3 (Week 3): Pt will increase speech intelligibility at the phrase level to achieve ~ 75% intelligibility given Min A cues for use of speech intelligibility strategies.  SLP Short Term Goal 4 (Week 3): Pt will complete basic familiar problem solving tasks related to ADLs with Mod A cues.  SLP Short Term Goal 5 (Week 3): Pt will consume therapeutic trials of dys 3 textures with minimal overt s/s of aspiration and supervision cues for use of compensatory swallow strategies.   Skilled Therapeutic Interventions:   Pt was seen for skilled ST targeting goals for dysphagia and communication.  Pt's daughter was present and SLP facilitated the session with skilled education regarding pt's recommended diet.  SLP discussed textures to avoid and ways to make foods softer and easier to chew to minimize oral residue.  A handout was provided to facilitate carryover in the home environment.  All questions were answered to pt's daughter's satisfaction  SLP also facilitated the session with structured naming practice.  Pt completed phrases with a targeted word with min assist-supervision for word finding.  Pt could even match targeted word from a field of 5 for >80% accuracy with min assist-supervision cues.   Pt was  left in wheelchair with daughter at bedside and call bell within reach.  Continue per current plan of care.      Function:  Eating Eating                 Cognition Comprehension Comprehension  assist level: Understands basic 90% of the time/cues < 10% of the time  Expression   Expression assist level: Expresses basic 75 - 89% of the time/requires cueing 10 - 24% of the time. Needs helper to occlude trach/needs to repeat words.  Social Interaction Social Interaction assist level: Interacts appropriately 90% of the time - Needs monitoring or encouragement for participation or interaction.  Problem Solving Problem solving assist level: Solves basic 75 - 89% of the time/requires cueing 10 - 24% of the time  Memory Memory assist level: Recognizes or recalls 50 - 74% of the time/requires cueing 25 - 49% of the time    Pain Pain Assessment Pain Assessment: No/denies pain  Therapy/Group: Individual Therapy  Megumi Treaster, Melanee SpryNicole L 06/05/2017, 3:45 PM

## 2017-06-05 NOTE — Progress Notes (Signed)
   Subjective:    Patient ID: Brendan [name], male    DOB: 03/03/1941, 77 y.o.   MRN: [id number]  HPI:  Pt seen in the hospital today.  Daughter is present today.  Patient reports that he is doing well.  Reports that he is eating well (was sitting up and eating when I entered the room).  States that he is working hard with therapy.      Review of Systems: Denies any pain.  Denies shortness of breath.     Objective:   Physical Exam  Vitals:   06/05/17 0034 06/05/17 0942  BP: (!) 109/46 (!) 129/51  Pulse: (!) 57 73  Resp: 17   Temp: 98.8 F (37.1 C)   SpO2: 98%     Gen:  Appears stated age and in NAD.  Alert and interactive with me today. HEENT:  Normocephalic, atraumatic. The mucous membranes are moist.   NEUROLOGICAL:  Orientation:  The patient is alert and oriented x 2. Cranial nerves: There is mild right facial droop.  Extraocular muscles are intact.  Speech is fluent, clear but dysphasic but is much improved from last visit. Tone: Tone is good throughout.   Sensation: Sensation is intact to light touch x 4 Motor: Patient actually had very good grip strength on the right today.  He is able to move his fingers.  He has motion in the forearm and just a little bit of motion in the right shoulder.  He is able to lift the right leg from the antigravity.  Strength on the left is at least antigravity in the upper and lower extremities. Abnormal movements: There is some tremor in the left upper extremity when he eats.  Labs:  Lab Results  Component Value Date   WBC 7.1 06/05/2017   HGB 10.2 (L) 06/05/2017   HCT 31.2 (L) 06/05/2017   MCV 101.0 (H) 06/05/2017   PLT 115 (L) 06/05/2017      Assessment & Plan:   1.  S/p intraparenchmal hemmorhage, post op DBS  -I had a long talk with the patient and daughter.  Discussed prognosis.  Discussed the importance of interacting and working hard with therapy, including when he is discharged at the end of the week.  He has made a  significant amount of progress since I last saw the patient.  Discussed the importance of trying to use the right hand for common tasks, instead of just using the left.  Patient will follow up with me after discharge.  He already has an appointment, but can move the appointment further out if he is unable to attend that appointment.

## 2017-06-05 NOTE — Progress Notes (Signed)
Physical Therapy Session Note  Patient Details  Name: Brendan [name] MRN: [id number] Date of Birth: [birth year]  Today's Date: 06/05/2017 PT Individual Time: 1530-1600 PT Individual Time Calculation (min): 30 min   Short Term Goals: Week 2:  PT Short Term Goal 1 (Week 2): Pt will increase bed mobility to Mod A PT Short Term Goal 1 - Progress (Week 2): Met PT Short Term Goal 2 (Week 2): Pt will increase transfers to Mod A PT Short Term Goal 2 - Progress (Week 2): Partly met PT Short Term Goal 3 (Week 2): Pt will ambulate with LRAD about 25 feet with mod A.  PT Short Term Goal 3 - Progress (Week 2): Partly met PT Short Term Goal 4 (Week 2): Pt will ascend/descend 4 stairs with B rails and max A.  PT Short Term Goal 4 - Progress (Week 2): Progressing toward goal PT Short Term Goal 5 (Week 2): Pt will propel w/c x 50 ft with Min A PT Short Term Goal 5 - Progress (Week 2): Partly met  Skilled Therapeutic Interventions/Progress Updates:   Pt toileting upon arrival, assisted w/ stand pivot back to w/c w/ min-mod assist w/ cues for UE support on rails and posture. RN present providing pericare. Pt agreeable to therapy, no c/o pain. Total assist w/c transport to gym. Worked on endurance w/ gait and negotiating obstacles in home-like environment. Min guard for gait, min-mod cues for obstacle negotiation. Ambulated 70' and 50' in home-like environment. Ambulated 100' back to room w/ RW. Ended session in supine, call bell within reach and all needs met.   Therapy Documentation Precautions:  Precautions Precautions: Fall Precaution Comments: Lt gaze preference, Rt hemi, aphasic, R inattention Restrictions Weight Bearing Restrictions: No Pain: Pain Assessment Pain Assessment: No/denies pain  See Function Navigator for Current Functional Status.   Therapy/Group: Individual Therapy  Lavina Resor K Arnette 06/05/2017, 5:02 PM

## 2017-06-05 NOTE — Progress Notes (Signed)
Pt wife able to demonstrate CIC technique with supervision of this nurse and Estate manager/land agentAwndrea RN. Able to demonstrate donning sterile gloves, cleansing penis with iodine swabs, inserting catheter into penis while maintaining sterile technique, able to recognize signs of emptied bladder. Understands home technique may not be the same as hospital technique. [name] adamant she wants to continue CIC at home instead of having a foley.

## 2017-06-05 NOTE — Progress Notes (Signed)
Physical Therapy Note  Patient Details  Name: Brendan [name] MRN: [id number] Date of Birth: 07/25/1940 Today's Date: 06/05/2017    Time: 1315-1412 57 minutes  1:1 No c/o pain. Pt performs bed mobility with increased time and supervision.  Squat pivot transfer to w/c with min A.  W/c mobility with bilat LEs with initial min A, then supervision for propulsion, mod/max cuing for Rt attention.  Simulated car transfer with squat pivot with min A to enter car, mod A to exit car.  Stand pivot transfer to car with RW with min A for entry and exit.  gait training in home and controlled environments with min A with RW 100' x 2.  Furniture transfer to low couch with pt able to stand with min guard.  Stair negotiation 4 stairs x 2 with 1 handrail with min A, min cuing for sequencing.  UE NMR with tabletop AAROM with pt with much improved Rt UE function this week.  Pt left in bed with alarm on, daughter present, needs at hand.   Brendan [name] 06/05/2017, 2:15 PM

## 2017-06-05 NOTE — Progress Notes (Signed)
Subjective/Complaints: Patient seen lying this morning. Daughter at bedside states patient woke up with 3 AM this morning restless and has not slept since. Patient however appears much more alert, interactive, M.D. This morning. Discussed with nursing Risperdal administration.  ROS: Denies nausea, vomiting, diarrhea, shortness of breath or chest pain    Objective: Vital Signs: Blood pressure (!) 109/46, pulse (!) 57, temperature 98.8 F (37.1 C), temperature source Oral, resp. rate 17, height _0  (1.753 m), weight 83.9 kg (184 lb 15.5 oz), SpO2 98 %. No results found. Results for orders placed or performed during the hospital encounter of 05/18/17 (from the past 72 hour(s))  Glucose, capillary     Status: Abnormal   Collection Time: 06/02/17 11:39 AM  Result Value Ref Range   Glucose-Capillary 198 (H) 65 - 99 mg/dL  Glucose, capillary     Status: None   Collection Time: 06/02/17  4:36 PM  Result Value Ref Range   Glucose-Capillary 95 65 - 99 mg/dL  Glucose, capillary     Status: Abnormal   Collection Time: 06/02/17  8:43 PM  Result Value Ref Range   Glucose-Capillary 115 (H) 65 - 99 mg/dL  Glucose, capillary     Status: Abnormal   Collection Time: 06/03/17  6:36 AM  Result Value Ref Range   Glucose-Capillary 109 (H) 65 - 99 mg/dL  Glucose, capillary     Status: Abnormal   Collection Time: 06/03/17 11:24 AM  Result Value Ref Range   Glucose-Capillary 117 (H) 65 - 99 mg/dL  Glucose, capillary     Status: Abnormal   Collection Time: 06/03/17  4:23 PM  Result Value Ref Range   Glucose-Capillary 129 (H) 65 - 99 mg/dL  Glucose, capillary     Status: Abnormal   Collection Time: 06/03/17  8:47 PM  Result Value Ref Range   Glucose-Capillary 130 (H) 65 - 99 mg/dL  Basic metabolic panel     Status: Abnormal   Collection Time: 06/04/17  4:00 AM  Result Value Ref Range   Sodium 140 135 - 145 mmol/L   Potassium 4.6 3.5 - 5.1 mmol/L   Chloride 108 101 - 111 mmol/L   CO2 25 22 -  32 mmol/L   Glucose, Bld 117 (H) 65 - 99 mg/dL   BUN 15 6 - 20 mg/dL   Creatinine, Ser 1.07 0.61 - 1.24 mg/dL   Calcium 8.7 (L) 8.9 - 10.3 mg/dL   GFR calc non Af Amer >60 >60 mL/min   GFR calc Af Amer >60 >60 mL/min    Comment: (NOTE) The eGFR has been calculated using the CKD EPI equation. This calculation has not been validated in all clinical situations. eGFR's persistently <60 mL/min signify possible Chronic Kidney Disease.    Anion gap 7 5 - 15    Comment: Performed at [facility] [address], [place], [place] [address]  CBC with Differential/Platelet     Status: Abnormal   Collection Time: 06/04/17  4:00 AM  Result Value Ref Range   WBC 4.7 4.0 - 10.5 K/uL   RBC 3.03 (L) 4.22 - 5.81 MIL/uL   Hemoglobin 9.8 (L) 13.0 - 17.0 g/dL   HCT 30.2 (L) 39.0 - 52.0 %   MCV 99.7 78.0 - 100.0 fL   MCH 32.3 26.0 - 34.0 pg   MCHC 32.5 30.0 - 36.0 g/dL   RDW 13.8 11.5 - 15.5 %   Platelets 126 (L) 150 - 400 K/uL   Neutrophils Relative %  58 %   Neutro Abs 2.8 1.7 - 7.7 K/uL   Lymphocytes Relative 33 %   Lymphs Abs 1.5 0.7 - 4.0 K/uL   Monocytes Relative 6 %   Monocytes Absolute 0.3 0.1 - 1.0 K/uL   Eosinophils Relative 3 %   Eosinophils Absolute 0.1 0.0 - 0.7 K/uL   Basophils Relative 0 %   Basophils Absolute 0.0 0.0 - 0.1 K/uL    Comment: Performed at [facility] [address] [place], [place] [address]  Glucose, capillary     Status: Abnormal   Collection Time: 06/04/17  6:05 AM  Result Value Ref Range   Glucose-Capillary 115 (H) 65 - 99 mg/dL  Glucose, capillary     Status: Abnormal   Collection Time: 06/04/17 12:01 PM  Result Value Ref Range   Glucose-Capillary 210 (H) 65 - 99 mg/dL  Glucose, capillary     Status: None   Collection Time: 06/04/17  5:08 PM  Result Value Ref Range   Glucose-Capillary 74 65 - 99 mg/dL  Glucose, capillary     Status: Abnormal   Collection Time: 06/04/17  9:19 PM  Result Value Ref Range   Glucose-Capillary 128 (H) 65 - 99  mg/dL   Comment 1 Notify RN   CBC with Differential/Platelet     Status: Abnormal   Collection Time: 06/05/17  5:02 AM  Result Value Ref Range   WBC 7.1 4.0 - 10.5 K/uL   RBC 3.09 (L) 4.22 - 5.81 MIL/uL   Hemoglobin 10.2 (L) 13.0 - 17.0 g/dL   HCT 31.2 (L) 39.0 - 52.0 %   MCV 101.0 (H) 78.0 - 100.0 fL   MCH 33.0 26.0 - 34.0 pg   MCHC 32.7 30.0 - 36.0 g/dL   RDW 14.2 11.5 - 15.5 %   Platelets 115 (L) 150 - 400 K/uL    Comment: PLATELET COUNT CONFIRMED BY SMEAR   Neutrophils Relative % 75 %   Neutro Abs 5.4 1.7 - 7.7 K/uL   Lymphocytes Relative 16 %   Lymphs Abs 1.1 0.7 - 4.0 K/uL   Monocytes Relative 7 %   Monocytes Absolute 0.5 0.1 - 1.0 K/uL   Eosinophils Relative 2 %   Eosinophils Absolute 0.1 0.0 - 0.7 K/uL   Basophils Relative 0 %   Basophils Absolute 0.0 0.0 - 0.1 K/uL    Comment: Performed at [facility], [address] [place], [place] [address]  Basic metabolic panel     Status: Abnormal   Collection Time: 06/05/17  5:02 AM  Result Value Ref Range   Sodium 141 135 - 145 mmol/L   Potassium 4.6 3.5 - 5.1 mmol/L   Chloride 107 101 - 111 mmol/L   CO2 23 22 - 32 mmol/L   Glucose, Bld 116 (H) 65 - 99 mg/dL   BUN 13 6 - 20 mg/dL   Creatinine, Ser 1.12 0.61 - 1.24 mg/dL   Calcium 9.1 8.9 - 10.3 mg/dL   GFR calc non Af Amer >60 >60 mL/min   GFR calc Af Amer >60 >60 mL/min    Comment: (NOTE) The eGFR has been calculated using the CKD EPI equation. This calculation has not been validated in all clinical situations. eGFR's persistently <60 mL/min signify possible Chronic Kidney Disease.    Anion gap 11 5 - 15    Comment: Performed at [facility] [address], [place], [place] [address]  Glucose, capillary     Status: Abnormal  Collection Time: 06/05/17  6:37 AM  Result Value Ref Range   Glucose-Capillary 108 (H) 65 - 99 mg/dL   Comment 1 Notify RN     General: NAD. Well-developed.  HEENT: Normocephalic, atraumatic. Cardio: RRR. No JVD. Resp: CTA  Bilaterally. Normal effort   GI: BS positive and ND Musc/Skel:  No edema, no tenderness. Neuro: Alert, improving spontaneous speech  Expressive >> Receptive aphasia Motor:  RUE/RLE: 0/5 proximal to distal, increased tone UE and LE (unchanged) LUE tremor Dysarthria Skin:   Intact. Warm and dry   Assessment/Plan: 1. Functional deficits secondary to left frontal parietal intraparenchymal hemorrhage with right hemiparesis, aphasia and cognitive deficits which require 3+ hours per day of interdisciplinary therapy in a comprehensive inpatient rehab setting. Physiatrist is providing close team supervision and 24 hour management of active medical problems listed below. Physiatrist and rehab team continue to assess barriers to discharge/monitor patient progress toward functional and medical goals. FIM: Function - Bathing Position: Wheelchair/chair at sink Body parts bathed by patient: Left arm, Chest, Abdomen, Right upper leg, Left upper leg Body parts bathed by helper: Buttocks, Back, Right arm, Front perineal area Bathing not applicable: Left lower leg, Right lower leg Assist Level: 2 helpers(sit > stand level)  Function- Upper Body Dressing/Undressing What is the patient wearing?: Pull over shirt/dress Pull over shirt/dress - Perfomed by patient: Thread/unthread left sleeve, Pull shirt over trunk Pull over shirt/dress - Perfomed by helper: Thread/unthread right sleeve, Put head through opening Button up shirt - Perfomed by patient: Thread/unthread left sleeve Button up shirt - Perfomed by helper: Thread/unthread right sleeve, Pull shirt around back, Button/unbutton shirt Assist Level: (Mod assist) Function - Lower Body Dressing/Undressing What is the patient wearing?: Pants Position: Sitting EOB Pants- Performed by patient: Thread/unthread left pants leg Pants- Performed by helper: Thread/unthread right pants leg, Pull pants up/down, Thread/unthread left pants leg Non-skid slipper  socks- Performed by helper: Don/doff right sock, Don/doff left sock TED Hose - Performed by helper: Don/doff right TED hose, Don/doff left TED hose Assist for footwear: Dependant Assist for lower body dressing: (Total assist)  Function - Toileting Toileting activity did not occur: No continent bowel/bladder event Toileting steps completed by patient: Performs perineal hygiene Toileting steps completed by helper: Adjust clothing prior to toileting, Performs perineal hygiene, Adjust clothing after toileting Toileting Assistive Devices: Grab bar or rail Assist level: Supervision or verbal cues  Function - Air cabin crew transfer activity did not occur: (not attempted) Toilet transfer assistive device: Bedside commode, Walker Assist level to toilet: 2 helpers(per [employer], NT report) Assist level to bedside commode (at bedside): Touching or steadying assistance (Pt > 75%) Assist level from bedside commode (at bedside): Touching or steadying assistance (Pt > 75%)  Function - Chair/bed transfer Chair/bed transfer method: Squat pivot Chair/bed transfer assist level: Moderate assist (Pt 50 - 74%/lift or lower) Chair/bed transfer assistive device: Armrests Chair/bed transfer details: Verbal cues for technique, Verbal cues for precautions/safety, Verbal cues for sequencing, Verbal cues for safe use of DME/AE  Function - Locomotion: Wheelchair Type: Manual Wheelchair activity did not occur: Safety/medical concerns Max wheelchair distance: 100 ft Assist Level: Touching or steadying assistance (Pt > 75%) Wheel 50 feet with 2 turns activity did not occur: Safety/medical concerns Assist Level: Touching or steadying assistance (Pt > 75%) Wheel 150 feet activity did not occur: Safety/medical concerns Function - Locomotion: Ambulation Assistive device: Walker-platform Max distance: 3 Assist level: Maximal assist (Pt 25 - 49%) Walk 10 feet activity did not occur: Safety/medical  concerns Assist level: Moderate assist (Pt 50 - 74%) Walk 50 feet with 2 turns activity did not occur: Safety/medical concerns Assist level: Moderate assist (Pt 50 - 74%) Walk 150 feet activity did not occur: Safety/medical concerns Walk 10 feet on uneven surfaces activity did not occur: Safety/medical concerns  Function - Comprehension Comprehension: Auditory Comprehension assist level: Understands basic 75 - 89% of the time/ requires cueing 10 - 24% of the time  Function - Expression Expression: Verbal Expression assist level: Expresses basic 75 - 89% of the time/requires cueing 10 - 24% of the time. Needs helper to occlude trach/needs to repeat words.  Function - Social Interaction Social Interaction assist level: Interacts appropriately 75 - 89% of the time - Needs redirection for appropriate language or to initiate interaction.  Function - Problem Solving Problem solving assist level: Solves basic 50 - 74% of the time/requires cueing 25 - 49% of the time  Function - Memory Memory assistive device: Other (Comment)(Calendar ) Memory assist level: Recognizes or recalls 50 - 74% of the time/requires cueing 25 - 49% of the time Patient normally able to recall (first 3 days only): That he or she is in a hospital, Location of own room, Staff names and faces  Medical Problem List and Plan: 1.Decreased functional mobilitysecondary to left frontal intraparenchymal hemorrhage with recent deep brain stimulator for tremor.    Cont CIR   WHO/PRAFO 2. DVT Prophylaxis/Anticoagulation: SCDs. Monitor for any signs of DVT 3. Pain Management:Neurontin 300 mg 3 times a day, Tylenol as needed 4. Mood:Provide emotional support 5. Neuropsych: This patientisnot capable of making decisions onhisown behalf.   limit neuro-sedating meds when possible 6. Skin/Wound Care:Routine skin checks 7. Fluids/Electrolytes/Nutrition:Routine I&O's 8.Hypertension.    Cozaar 100 mg daily, decreased to 50  on 2/5   Lasix 20 mg daily d/ced on 2/5.   Propranolol 80 BID, decreased to 40 BID on 2/15 Vitals:   06/04/17 1350 06/05/17 0034  BP: (!) 106/48 (!) 109/46  Pulse: 60 (!) 57  Resp: 18 17  Temp: 98.5 F (36.9 C) 98.8 F (37.1 C)  SpO2: 99% 98%    Labile on 2/19 9.CAD with stenting/CABG. No chest pain or shortness of breath 10.Hyperlipidemia. Lipitor,Lovaza 11. Tremors:   mysoline and inderal ongoing   simulator not operational yet, no plans to initiate in near future after discussion with Neuro 12.  Dysphagia   Advanced to D3 thins    Advance diet as tolerated   CXR reviewed, unremarkable for acute process 13. Hypoalb- Prostat 14. Prediabetes   Diet change to carb modified, CBGs and SSI started on 2/6  Unable to start metformin due to renal function and ?ability to maintain PO fluids   Amaryl started on 2/12   Labile on 2/19 15. ABLA   Hb 10.2 on 2/19   Cont to monitor 16. Confusion  Multifactorial due hypotension and sleep/wake changes and intracranial bleed/aphasia   CXR reviewed, unremarkable for acute changes   UA unremarkable, urine culture NG 17. Sleep disturbance with agitation  Trazodone 50 qhs scheduled on 2/5, changed to Seroquel 12.5 on 2/7, increased to 25 on 2/12, decreased back to 12.5 due to lethargy on 2/14   Melatonin started on 2/11, increased on 2/14   Risperdal PRN   Improving overall, but labile at times   Educated wife on environment modifications 18. Spastic hemiplegia  Baclofen 5 TID started on 2/7, changed to 5 mg BID and 18m qhs  Cont ROM  Cont bracing 19. AKI  Cr 1.12 on 2/19   Encourage fluids  IVF qhs started on 2/15, DC'd on 2/19 20.  Neurogenic bladder   Flomax increased to 0.8 on 2/11   Bethanechol started on 2/12   UCX with enterococcus   Empiric keflex changed to amoxicillin after sensitivities on 2/18 21. Thromboctopenia  Plts 115 on 2/19   Will discuss with pharmacy potential medication culprits  LOS (Days) 18 A FACE TO  FACE EVALUATION WAS PERFORMED  Brendan [name] Brendan [name] 06/05/2017, 8:38 AM

## 2017-06-05 NOTE — Progress Notes (Signed)
Occupational Therapy Session Note  Patient Details  Name: Brendan [name] MRN: [id number] Date of Birth: 04/19/1940  Today's Date: 06/05/2017 OT Individual Time: 1000-1100 OT Individual Time Calculation (min): 60 min    Short Term Goals: Week 3:  OT Short Term Goal 1 (Week 3): STG = LTGs due to remaining LOS  Skilled Therapeutic Interventions/Progress Updates:    Treatment session with focus on self-care retraining, dynamic standing balance, and functional use of RUE.  Pt received supine in bed reporting he did not sleep well, but willing to get OOB for treatment session.  Completed bed mobility and stand step transfer to w/c with RW with min assist.  Engaged in bathing and dressing at sit > stand level at sink with setup for items and min cues for use of RUE as gross assist.  Pt able to incorporate RUE into bathing with and over hand assist.  Donned button up shirt in pull over style to decrease burden of care.  Pt able to thread Lt pant leg and required 50% assist when threading Rt pant leg.  Pt able to pull pants over hips on Lt side in standing, requiring assist to fully pull over Rt hip.  Finished eating breakfast during session.  Pt demonstrating increased tremors in RUE, ?if fatigue impacting tremors.  Provided pt with wrist weight and metal spoon to decrease tremors.  Daughter present at beginning of session, leaving to make phone calls, discussed recommendation for DME and asking for additional information about measurements for home, provided with home measurement sheet.  Therapy Documentation Precautions:  Precautions Precautions: Fall Precaution Comments: Lt gaze preference, Rt hemi, aphasic, R inattention Restrictions Weight Bearing Restrictions: No General:   Vital Signs: Therapy Vitals Pulse Rate: 73 BP: (!) 129/51 Pain:  Pt with no c/o pain  See Function Navigator for Current Functional Status.   Therapy/Group: Individual Therapy  Rosalio LoudHOXIE, Allison Silva 06/05/2017, 12:04 PM

## 2017-06-06 ENCOUNTER — Inpatient Hospital Stay (HOSPITAL_COMMUNITY): Payer: PPO | Admitting: Occupational Therapy

## 2017-06-06 ENCOUNTER — Inpatient Hospital Stay (HOSPITAL_COMMUNITY): Payer: PPO | Admitting: Speech Pathology

## 2017-06-06 ENCOUNTER — Ambulatory Visit: Payer: Self-pay | Admitting: *Deleted

## 2017-06-06 ENCOUNTER — Inpatient Hospital Stay (HOSPITAL_COMMUNITY): Payer: PPO | Admitting: Physical Therapy

## 2017-06-06 ENCOUNTER — Inpatient Hospital Stay (HOSPITAL_COMMUNITY): Payer: PPO

## 2017-06-06 LAB — GLUCOSE, CAPILLARY
Glucose-Capillary: 121 mg/dL — ABNORMAL HIGH (ref 65–99)
Glucose-Capillary: 105 mg/dL — ABNORMAL HIGH (ref 65–99)
Glucose-Capillary: 133 mg/dL — ABNORMAL HIGH (ref 65–99)

## 2017-06-06 NOTE — Progress Notes (Signed)
Physical Therapy Note  Patient Details  Name: Brendan [name] MRN: [id number] Date of Birth: 02/15/1941 Today's Date: 06/06/2017  0900-1000, 60 min, indiviidual tx Pain: none per pt  [name] reported that pt needed to use toilet for BM. Toilet transfer using RW for continent voiding, no BM. Family ed with wife for w/c parts mgt, toilet transfer, donning thigh high TEDS, use of RW R hand splint.  Gait on level tile with RW with wife guarding pt, x 75' iwht min guard assist.  Up/down 4 steps 2 rails with min guard from PT , min cues for sequencing. Wife also performed transfer w/c> low sofa in ADL room, using RW.  PT also instructed wife on appropriate amount of cueing.  [name] would benefit from additional time guarding pt during ambulation in crowded, home-type setting and to/from toilet.  Pt left resting in w/c with quick release belt applied and all needs within reach, wife present.  See function navigator for current status.   Tryphena Perkovich 06/06/2017, 7:49 AM

## 2017-06-06 NOTE — Progress Notes (Signed)
Physical Therapy Note  Patient Details  Name: Brendan [name] MRN: [id number] Date of Birth: 07/06/1940 Today's Date: 06/06/2017    Time: 1300-1356 56 minutes  1:1 No c/o pain. Pt performs bed mobility with supervision with increased time.  Stand pivot transfers with and without RW with min A.  Gait with RW 50' x 2, 100' with supervision.  Stair negotiation with wife observing but not comfortable performing, PT provides min A for 4 stairs with 2 handrails.  Simulated car transfer with pt's wife performing with RW with min A.  Supine NMR with LTR, bridging, hip/knee flex/ext with LEs on theraball, UE diagonals with AAROM for core strengthening and stretching.  Seated UE NMR with grasp and release task with bean bags with pt improving Rt UE strength.  Pt left in bed with alarm on, wife present.   Linnaea Ahn 06/06/2017, 1:57 PM

## 2017-06-06 NOTE — Consult Note (Signed)
   [facility]   06/06/2017  Brendan [name] 05/28/1940 [id number]    [facility].  Spoke with [name] and his wife, Brendan [name], at bedside earlier. Discussed [facility] follow up post hospital discharge. Corrie DandyMary endorses she has the [facility] and information from writer's previous visit. Mrs. Brendan [name] expresses appreciation of the additional support. Also made Mrs. Brendan [name] aware that [facility] will be contacting her about the additional aide services. Reiterated [facility] and [facility] will not interfere or replace services provided by home health.  Mrs. Brendan [name] endorses discharge date is for 06/09/17. Writer contacted [facility] to make aware of writer's conversation with wife and her contact information.  Brendan PiedraMary Seldon cell number is [phone].    Brendan NobleAtika Labrittany Wechter, MSN-Ed, RN,BSN [facility] Care Management Hospital Liaison [phone]

## 2017-06-06 NOTE — Progress Notes (Signed)
Subjective/Complaints: Pt seen lying in bed this AM.  Wife at bedside states pt slept the best he has slept since being in the hospital.  Pt is interactive.    ROS: Appears to deny CP, SOB, N/V/D.  Objective: Vital Signs: Blood pressure 117/66, pulse 64, temperature 98 F (36.7 C), temperature source Oral, resp. rate 18, height '5\' 9"'  (1.753 m), weight 91 kg (200 lb 9.9 oz), SpO2 100 %. No results found. Results for orders placed or performed during the hospital encounter of 05/18/17 (from the past 72 hour(s))  Glucose, capillary     Status: Abnormal   Collection Time: 06/03/17 11:24 AM  Result Value Ref Range   Glucose-Capillary 117 (H) 65 - 99 mg/dL  Glucose, capillary     Status: Abnormal   Collection Time: 06/03/17  4:23 PM  Result Value Ref Range   Glucose-Capillary 129 (H) 65 - 99 mg/dL  Glucose, capillary     Status: Abnormal   Collection Time: 06/03/17  8:47 PM  Result Value Ref Range   Glucose-Capillary 130 (H) 65 - 99 mg/dL  Basic metabolic panel     Status: Abnormal   Collection Time: 06/04/17  4:00 AM  Result Value Ref Range   Sodium 140 135 - 145 mmol/L   Potassium 4.6 3.5 - 5.1 mmol/L   Chloride 108 101 - 111 mmol/L   CO2 25 22 - 32 mmol/L   Glucose, Bld 117 (H) 65 - 99 mg/dL   BUN 15 6 - 20 mg/dL   Creatinine, Ser 1.07 0.61 - 1.24 mg/dL   Calcium 8.7 (L) 8.9 - 10.3 mg/dL   GFR calc non Af Amer >60 >60 mL/min   GFR calc Af Amer >60 >60 mL/min    Comment: (NOTE) The eGFR has been calculated using the CKD EPI equation. This calculation has not been validated in all clinical situations. eGFR's persistently <60 mL/min signify possible Chronic Kidney Disease.    Anion gap 7 5 - 15    Comment: Performed at [facility] [address] [place], [place] [address]  CBC with Differential/Platelet     Status: Abnormal   Collection Time: 06/04/17  4:00 AM  Result Value Ref Range   WBC 4.7 4.0 - 10.5 K/uL   RBC 3.03 (L) 4.22 - 5.81 MIL/uL   Hemoglobin 9.8  (L) 13.0 - 17.0 g/dL   HCT 30.2 (L) 39.0 - 52.0 %   MCV 99.7 78.0 - 100.0 fL   MCH 32.3 26.0 - 34.0 pg   MCHC 32.5 30.0 - 36.0 g/dL   RDW 13.8 11.5 - 15.5 %   Platelets 126 (L) 150 - 400 K/uL   Neutrophils Relative % 58 %   Neutro Abs 2.8 1.7 - 7.7 K/uL   Lymphocytes Relative 33 %   Lymphs Abs 1.5 0.7 - 4.0 K/uL   Monocytes Relative 6 %   Monocytes Absolute 0.3 0.1 - 1.0 K/uL   Eosinophils Relative 3 %   Eosinophils Absolute 0.1 0.0 - 0.7 K/uL   Basophils Relative 0 %   Basophils Absolute 0.0 0.0 - 0.1 K/uL    Comment: Performed at [facility] [address] [place], [place] [address]  Glucose, capillary     Status: Abnormal   Collection Time: 06/04/17  6:05 AM  Result Value Ref Range   Glucose-Capillary 115 (H) 65 - 99 mg/dL  Glucose, capillary     Status: Abnormal   Collection Time: 06/04/17 12:01 PM  Result Value  Ref Range   Glucose-Capillary 210 (H) 65 - 99 mg/dL  Glucose, capillary     Status: None   Collection Time: 06/04/17  5:08 PM  Result Value Ref Range   Glucose-Capillary 74 65 - 99 mg/dL  Glucose, capillary     Status: Abnormal   Collection Time: 06/04/17  9:19 PM  Result Value Ref Range   Glucose-Capillary 128 (H) 65 - 99 mg/dL   Comment 1 Notify RN   CBC with Differential/Platelet     Status: Abnormal   Collection Time: 06/05/17  5:02 AM  Result Value Ref Range   WBC 7.1 4.0 - 10.5 K/uL   RBC 3.09 (L) 4.22 - 5.81 MIL/uL   Hemoglobin 10.2 (L) 13.0 - 17.0 g/dL   HCT 31.2 (L) 39.0 - 52.0 %   MCV 101.0 (H) 78.0 - 100.0 fL   MCH 33.0 26.0 - 34.0 pg   MCHC 32.7 30.0 - 36.0 g/dL   RDW 14.2 11.5 - 15.5 %   Platelets 115 (L) 150 - 400 K/uL    Comment: PLATELET COUNT CONFIRMED BY SMEAR   Neutrophils Relative % 75 %   Neutro Abs 5.4 1.7 - 7.7 K/uL   Lymphocytes Relative 16 %   Lymphs Abs 1.1 0.7 - 4.0 K/uL   Monocytes Relative 7 %   Monocytes Absolute 0.5 0.1 - 1.0 K/uL   Eosinophils Relative 2 %   Eosinophils Absolute 0.1 0.0 - 0.7 K/uL   Basophils  Relative 0 %   Basophils Absolute 0.0 0.0 - 0.1 K/uL    Comment: Performed at [facility], [address] [place], [place] [address]  Basic metabolic panel     Status: Abnormal   Collection Time: 06/05/17  5:02 AM  Result Value Ref Range   Sodium 141 135 - 145 mmol/L   Potassium 4.6 3.5 - 5.1 mmol/L   Chloride 107 101 - 111 mmol/L   CO2 23 22 - 32 mmol/L   Glucose, Bld 116 (H) 65 - 99 mg/dL   BUN 13 6 - 20 mg/dL   Creatinine, Ser 1.12 0.61 - 1.24 mg/dL   Calcium 9.1 8.9 - 10.3 mg/dL   GFR calc non Af Amer >60 >60 mL/min   GFR calc Af Amer >60 >60 mL/min    Comment: (NOTE) The eGFR has been calculated using the CKD EPI equation. This calculation has not been validated in all clinical situations. eGFR's persistently <60 mL/min signify possible Chronic Kidney Disease.    Anion gap 11 5 - 15    Comment: Performed at [facility] [address], [place], [place] [address]  Glucose, capillary     Status: Abnormal   Collection Time: 06/05/17  6:37 AM  Result Value Ref Range   Glucose-Capillary 108 (H) 65 - 99 mg/dL   Comment 1 Notify RN   Glucose, capillary     Status: Abnormal   Collection Time: 06/05/17 11:22 AM  Result Value Ref Range   Glucose-Capillary 163 (H) 65 - 99 mg/dL  Glucose, capillary     Status: Abnormal   Collection Time: 06/05/17  5:16 PM  Result Value Ref Range   Glucose-Capillary 121 (H) 65 - 99 mg/dL  Glucose, capillary     Status: Abnormal   Collection Time: 06/05/17  8:48 PM  Result Value Ref Range   Glucose-Capillary 129 (H) 65 - 99 mg/dL  Glucose, capillary     Status: Abnormal   Collection Time: 06/06/17  6:49 AM  Result  Value Ref Range   Glucose-Capillary 121 (H) 65 - 99 mg/dL    General: NAD. Well-developed.  HEENT: Normocephalic, atraumatic. Cardio: RRR. No JVD. Resp: CTA Bilaterally. Normal effort   GI: BS positive and ND Musc/Skel:  No edema, no tenderness. Neuro: Alert, Good spontaneous speech  Expressive >> Receptive  aphasia Motor:  RUE/RLE: 0/5 proximal to distal, increased tone UE and LE (stable) LUE tremor Dysarthria Skin:   Intact. Warm and dry   Assessment/Plan: 1. Functional deficits secondary to left frontal parietal intraparenchymal hemorrhage with right hemiparesis, aphasia and cognitive deficits which require 3+ hours per day of interdisciplinary therapy in a comprehensive inpatient rehab setting. Physiatrist is providing close team supervision and 24 hour management of active medical problems listed below. Physiatrist and rehab team continue to assess barriers to discharge/monitor patient progress toward functional and medical goals. FIM: Function - Bathing Position: Wheelchair/chair at sink Body parts bathed by patient: Left arm, Chest, Abdomen, Right upper leg, Left upper leg, Front perineal area Body parts bathed by helper: Buttocks, Right lower leg, Left lower leg, Back Bathing not applicable: Left lower leg, Right lower leg Assist Level: (Mod assist)  Function- Upper Body Dressing/Undressing What is the patient wearing?: Pull over shirt/dress Pull over shirt/dress - Perfomed by patient: Put head through opening, Pull shirt over trunk Pull over shirt/dress - Perfomed by helper: Thread/unthread right sleeve, Thread/unthread left sleeve Button up shirt - Perfomed by patient: Thread/unthread left sleeve Button up shirt - Perfomed by helper: Thread/unthread right sleeve, Pull shirt around back, Button/unbutton shirt Assist Level: (Mod assist) Function - Lower Body Dressing/Undressing What is the patient wearing?: Pants Position: Wheelchair/chair at sink Pants- Performed by patient: Thread/unthread left pants leg Pants- Performed by helper: Thread/unthread right pants leg, Pull pants up/down Non-skid slipper socks- Performed by helper: Don/doff right sock, Don/doff left sock TED Hose - Performed by helper: Don/doff right TED hose, Don/doff left TED hose Assist for footwear:  Dependant Assist for lower body dressing: (Max assist)  Function - Toileting Toileting activity did not occur: No continent bowel/bladder event Toileting steps completed by patient: Performs perineal hygiene Toileting steps completed by helper: Adjust clothing prior to toileting, Performs perineal hygiene, Adjust clothing after toileting Toileting Assistive Devices: Grab bar or rail Assist level: Supervision or verbal cues  Function - Air cabin crew transfer activity did not occur: (not attempted) Toilet transfer assistive device: Bedside commode, Walker Assist level to toilet: 2 helpers(per [employer], NT report) Assist level to bedside commode (at bedside): Touching or steadying assistance (Pt > 75%) Assist level from bedside commode (at bedside): Touching or steadying assistance (Pt > 75%)  Function - Chair/bed transfer Chair/bed transfer method: Ambulatory, Stand pivot Chair/bed transfer assist level: Touching or steadying assistance (Pt > 75%) Chair/bed transfer assistive device: Walker Chair/bed transfer details: Verbal cues for technique, Verbal cues for precautions/safety, Verbal cues for sequencing, Verbal cues for safe use of DME/AE  Function - Locomotion: Wheelchair Type: Manual Wheelchair activity did not occur: Safety/medical concerns Max wheelchair distance: 100 ft Assist Level: Touching or steadying assistance (Pt > 75%) Wheel 50 feet with 2 turns activity did not occur: Safety/medical concerns Assist Level: Touching or steadying assistance (Pt > 75%) Wheel 150 feet activity did not occur: Safety/medical concerns Function - Locomotion: Ambulation Assistive device: Walker-rolling Max distance: 100' Assist level: Touching or steadying assistance (Pt > 75%) Walk 10 feet activity did not occur: Safety/medical concerns Assist level: Touching or steadying assistance (Pt > 75%) Walk 50 feet with 2 turns  activity did not occur: Safety/medical concerns Assist  level: Touching or steadying assistance (Pt > 75%) Walk 150 feet activity did not occur: Safety/medical concerns Walk 10 feet on uneven surfaces activity did not occur: Safety/medical concerns  Function - Comprehension Comprehension: Auditory Comprehension assist level: Understands basic 90% of the time/cues < 10% of the time  Function - Expression Expression: Verbal Expression assist level: Expresses basic 75 - 89% of the time/requires cueing 10 - 24% of the time. Needs helper to occlude trach/needs to repeat words.  Function - Social Interaction Social Interaction assist level: Interacts appropriately 90% of the time - Needs monitoring or encouragement for participation or interaction.  Function - Problem Solving Problem solving assist level: Solves basic 75 - 89% of the time/requires cueing 10 - 24% of the time  Function - Memory Memory assistive device: Other (Comment)(Calendar ) Memory assist level: Recognizes or recalls 50 - 74% of the time/requires cueing 25 - 49% of the time Patient normally able to recall (first 3 days only): That he or she is in a hospital, Location of own room, Staff names and faces  Medical Problem List and Plan: 1.Decreased functional mobilitysecondary to left frontal intraparenchymal hemorrhage with recent deep brain stimulator for tremor.    Cont CIR   WHO/PRAFO 2. DVT Prophylaxis/Anticoagulation: SCDs. Monitor for any signs of DVT 3. Pain Management:Neurontin 300 mg 3 times a day, Tylenol as needed 4. Mood:Provide emotional support 5. Neuropsych: This patientisnot capable of making decisions onhisown behalf.   limit neuro-sedating meds when possible 6. Skin/Wound Care:Routine skin checks 7. Fluids/Electrolytes/Nutrition:Routine I&O's 8.Hypertension.    Cozaar 100 mg daily, decreased to 50 on 2/5   Lasix 20 mg daily d/ced on 2/5.   Propranolol 80 BID, decreased to 40 BID on 2/15 Vitals:   06/05/17 1300 06/06/17 0541  BP: (!) 109/57  117/66  Pulse: 71 64  Resp: 15 18  Temp: 98.3 F (36.8 C) 98 F (36.7 C)  SpO2: 98% 100%    Relatively controlled on 2/20 9.CAD with stenting/CABG. No chest pain or shortness of breath 10.Hyperlipidemia. Lipitor,Lovaza 11. Tremors:   mysoline and inderal ongoing   simulator not operational yet, no plans to initiate in near future after discussion with Neuro 12.  Dysphagia   Advanced to D3 thins    Advance diet as tolerated   CXR reviewed, unremarkable for acute process 13. Hypoalb- Prostat 14. Prediabetes   Diet change to carb modified, CBGs and SSI started on 2/6  Unable to start metformin due to renal function and ?ability to maintain PO fluids   Amaryl started on 2/12   Slightly labile on 2/20 15. ABLA   Hb 10.2 on 2/19   Cont to monitor 16. Confusion  Multifactorial due hypotension and sleep/wake changes and intracranial bleed/aphasia   CXR reviewed, unremarkable for acute changes   UA unremarkable, urine culture NG 17. Sleep disturbance with agitation  Trazodone 50 qhs scheduled on 2/5, changed to Seroquel 12.5 on 2/7, increased to 25 on 2/12, decreased back to 12.5 due to lethargy on 2/14   Melatonin started on 2/11, increased on 2/14   Risperdal PRN   Improved overall, but labile at times   Educated wife on environment modifications 18. Spastic hemiplegia  Baclofen 5 TID started on 2/7, changed to 5 mg BID and 27m qhs  Cont ROM  Cont bracing 19. AKI  Cr 1.12 on 2/19   Encourage fluids  IVF qhs started on 2/15, DC'd on 2/19   Labs ordered  for tomorrow 20.  Neurogenic bladder   Flomax increased to 0.8 on 2/11   Bethanechol started on 2/12   UCX with enterococcus   Keflex changed to amoxicillin after sensitivities on 2/18   Labs ordered for tomorrow 21. Thromboctopenia  Plts 115 on 2/19   Will discuss with pharmacy potential medication culprits, d/ced PPI   Labs ordered for tomorrow  LOS (Days) 19 A FACE TO FACE EVALUATION WAS PERFORMED  Ankit Lorie Phenix 06/06/2017, 7:49 AM

## 2017-06-06 NOTE — Progress Notes (Signed)
Speech Language Pathology Daily Session Note  Patient Details  Name: Brendan [name] MRN: [id number] Date of Birth: 06/16/1940  Today's Date: 06/06/2017 SLP Individual Time: 0802-0858 SLP Individual Time Calculation (min): 56 min  Short Term Goals: Week 3: SLP Short Term Goal 1 (Week 3): Pt will sustain attention to basic familiar task  for ~ 15 minutes with Min A cues.  SLP Short Term Goal 2 (Week 3): Pt will convey immediate needs/wants to caregivers with min assist verbal cues.   SLP Short Term Goal 3 (Week 3): Pt will increase speech intelligibility at the phrase level to achieve ~ 75% intelligibility given Min A cues for use of speech intelligibility strategies.  SLP Short Term Goal 4 (Week 3): Pt will complete basic familiar problem solving tasks related to ADLs with Mod A cues.  SLP Short Term Goal 5 (Week 3): Pt will consume therapeutic trials of dys 3 textures with minimal overt s/s of aspiration and supervision cues for use of compensatory swallow strategies.   Skilled Therapeutic Interventions:  Pt was seen for skilled ST targeting goals for dysphagia and communication.   Pt was able to complete phrase closure tasks with supervision for >80% accuracy.  He could name objects from description for 75% accuracy with min assist for word finding. Pt could also participate in conversational exchanges with therapist during loosely structured conversations regarding personal, biographical information with min assist.  Pt consumed a functional snack of regular textures and thin liquids with mod I use of swallowing precautions and no overt s/s of aspiration with solids or liquids.  Pt was returned to room and left in wheelchair with wife at bedside and call bell within reach.  Continue per current plan of care.     Function:  Eating Eating   Modified Consistency Diet: Yes Eating Assist Level: More than reasonable amount of time           Cognition Comprehension Comprehension assist  level: Understands basic 90% of the time/cues < 10% of the time  Expression   Expression assist level: Expresses basic 75 - 89% of the time/requires cueing 10 - 24% of the time. Needs helper to occlude trach/needs to repeat words.  Social Interaction Social Interaction assist level: Interacts appropriately 90% of the time - Needs monitoring or encouragement for participation or interaction.  Problem Solving Problem solving assist level: Solves basic 75 - 89% of the time/requires cueing 10 - 24% of the time  Memory Memory assist level: Recognizes or recalls 50 - 74% of the time/requires cueing 25 - 49% of the time    Pain Pain Assessment Pain Assessment: No/denies pain  Therapy/Group: Individual Therapy  Brendan [name], Brendan [name] 06/06/2017, 11:58 AM

## 2017-06-06 NOTE — Progress Notes (Signed)
Occupational Therapy Session Note  Patient Details  Name: Brendan [name] MRN: [id number] Date of Birth: 11/05/1940  Today's Date: 06/06/2017 OT Individual Time: 1100-1200 OT Individual Time Calculation (min): 60 min    Short Term Goals: Week 3:  OT Short Term Goal 1 (Week 3): STG = LTGs due to remaining LOS  Skilled Therapeutic Interventions/Progress Updates:    Treatment session with focus on family education during self-care tasks and RUE NMR.  Pt received upright in w/c with wife present.  Engaged in bathing and dressing at sit > stand level at sink with pt completing sit <> stand with supervision.  Min assist - min guard during standing based on challenge level of activity.  Pt able to doff and don pants this session with min assist for standing balance to allow pt to focus on dressing task.  Educated on figure 4 position to increase success with threading pant legs.  Discussed DME with pt and wife, recommending shower seat and drop arm BSC.  [name] reports they plan to borrow seat for shower from family, discussed not completing shower transfers until able to complete with HHOT.  Engaged in RUE NMR with focus on increased active movement.  Pt able to obtain 80* shoulder flexion during reaching activity this session with obtaining pegs and placing in bucket.  Increased challenge to removing pegs from resistive foam board with good success, however unable to manipulate peg to return to upright in hole.  Left upright in w/c with lunch tray with wife to provide supervision/assist.  Therapy Documentation Precautions:  Precautions Precautions: Fall Precaution Comments: Lt gaze preference, Rt hemi, aphasic, R inattention Restrictions Weight Bearing Restrictions: No Pain: Pain Assessment Pain Assessment: No/denies pain  See Function Navigator for Current Functional Status.   Therapy/Group: Individual Therapy  Rosalio LoudHOXIE, [name] 06/06/2017, 12:13 PM

## 2017-06-07 ENCOUNTER — Inpatient Hospital Stay (HOSPITAL_COMMUNITY): Payer: PPO | Admitting: Occupational Therapy

## 2017-06-07 ENCOUNTER — Inpatient Hospital Stay (HOSPITAL_COMMUNITY): Payer: PPO

## 2017-06-07 ENCOUNTER — Inpatient Hospital Stay (HOSPITAL_COMMUNITY): Payer: PPO | Admitting: Speech Pathology

## 2017-06-07 DIAGNOSIS — G8110 Spastic hemiplegia affecting unspecified side: Secondary | ICD-10-CM

## 2017-06-07 DIAGNOSIS — R0989 Other specified symptoms and signs involving the circulatory and respiratory systems: Secondary | ICD-10-CM

## 2017-06-07 LAB — BASIC METABOLIC PANEL
Anion gap: 9 (ref 5–15)
BUN: 9 mg/dL (ref 6–20)
CO2: 24 mmol/L (ref 22–32)
Calcium: 8.9 mg/dL (ref 8.9–10.3)
Chloride: 108 mmol/L (ref 101–111)
Creatinine, Ser: 0.95 mg/dL (ref 0.61–1.24)
GFR calc Af Amer: >60 mL/min (ref >60)
GFR calc non Af Amer: >60 mL/min (ref >60)
Glucose, Bld: 130 mg/dL — ABNORMAL HIGH (ref 65–99)
Potassium: 4.6 mmol/L (ref 3.5–5.1)
Sodium: 141 mmol/L (ref 135–145)

## 2017-06-07 LAB — CBC WITH DIFFERENTIAL/PLATELET
Basophils Absolute: 0 10*3/uL (ref 0.0–0.1)
Basophils Relative: 0 %
Eosinophils Absolute: 0.2 10*3/uL (ref 0.0–0.7)
Eosinophils Relative: 4 %
HCT: 29.8 % — ABNORMAL LOW (ref 39.0–52.0)
Hemoglobin: 9.8 g/dL — ABNORMAL LOW (ref 13.0–17.0)
Lymphocytes Relative: 32 %
Lymphs Abs: 1.6 10*3/uL (ref 0.7–4.0)
MCH: 33.2 pg (ref 26.0–34.0)
MCHC: 32.9 g/dL (ref 30.0–36.0)
MCV: 101 fL — ABNORMAL HIGH (ref 78.0–100.0)
Monocytes Absolute: 0.4 10*3/uL (ref 0.1–1.0)
Monocytes Relative: 7 %
Neutro Abs: 2.9 10*3/uL (ref 1.7–7.7)
Neutrophils Relative %: 57 %
Platelets: 117 10*3/uL — ABNORMAL LOW (ref 150–400)
RBC: 2.95 MIL/uL — ABNORMAL LOW (ref 4.22–5.81)
RDW: 14.2 % (ref 11.5–15.5)
WBC: 5.1 10*3/uL (ref 4.0–10.5)

## 2017-06-07 LAB — GLUCOSE, CAPILLARY
Glucose-Capillary: 122 mg/dL — ABNORMAL HIGH (ref 65–99)
Glucose-Capillary: 99 mg/dL (ref 65–99)
Glucose-Capillary: 202 mg/dL — ABNORMAL HIGH (ref 65–99)
Glucose-Capillary: 95 mg/dL (ref 65–99)

## 2017-06-07 MED ORDER — BETHANECHOL CHLORIDE 25 MG PO TABS
25.0000 mg | ORAL_TABLET | Freq: Three times a day (TID) | ORAL | Status: DC
  Administered 2017-06-07 – 2017-06-09 (×6): 25 mg via ORAL
  Filled 2017-06-07 (×6): qty 1

## 2017-06-07 NOTE — Progress Notes (Signed)
Speech Language Pathology Daily Session Note  Patient Details  Name: Brendan [name] MRN: [id number] Date of Birth: 11/17/1940  Today's Date: 06/07/2017 SLP Individual Time: 1102-1200 SLP Individual Time Calculation (min): 58 min  Short Term Goals: Week 3: SLP Short Term Goal 1 (Week 3): Pt will sustain attention to basic familiar task  for ~ 15 minutes with Min A cues.  SLP Short Term Goal 2 (Week 3): Pt will convey immediate needs/wants to caregivers with min assist verbal cues.   SLP Short Term Goal 3 (Week 3): Pt will increase speech intelligibility at the phrase level to achieve ~ 75% intelligibility given Min A cues for use of speech intelligibility strategies.  SLP Short Term Goal 4 (Week 3): Pt will complete basic familiar problem solving tasks related to ADLs with Mod A cues.  SLP Short Term Goal 5 (Week 3): Pt will consume therapeutic trials of dys 3 textures with minimal overt s/s of aspiration and supervision cues for use of compensatory swallow strategies.   Skilled Therapeutic Interventions:   Pt was seen for skilled ST targeting cognitive goals.  SLP facilitated the session with a pipe tree task to address problem solving goals.  Pt completed task with min assist for task organization and error awareness.  Pt is making significant progress in both his cognition and his spontaneous communication for conveying novel concepts to caregivers.  Pt was returned to room and left in bed with bed alarm set, daughter at bedside, and call bell within reach.  Continue per current plan of care.     Function:  Eating Eating     Eating Assist Level: More than reasonable amount of time;Set up assist for   Eating Set Up Assist For: Opening containers       Cognition Comprehension Comprehension assist level: Understands basic 90% of the time/cues < 10% of the time  Expression   Expression assist level: Expresses basic 90% of the time/requires cueing < 10% of the time.  Social Interaction  Social Interaction assist level: Interacts appropriately 90% of the time - Needs monitoring or encouragement for participation or interaction.  Problem Solving Problem solving assist level: Solves basic 75 - 89% of the time/requires cueing 10 - 24% of the time  Memory Memory assist level: Recognizes or recalls 50 - 74% of the time/requires cueing 25 - 49% of the time    Pain Pain Assessment Pain Assessment: No/denies pain  Therapy/Group: Individual Therapy  Brendan [name], Brendan SpryNicole [name] 06/07/2017, 12:23 PM

## 2017-06-07 NOTE — Progress Notes (Signed)
Subjective/Complaints: Patient seen sitting up in bed this morning. Daughter at bedside. Patient complains of a sore throat. Daughter has questions regarding urology consult, catheterization, throat.  ROS: + Sore throat. Denies CP, SOB, N/V/D.  Objective: Vital Signs: Blood pressure (!) 115/56, pulse 61, temperature 98.1 F (36.7 C), temperature source Oral, resp. rate 18, height '5\' 9"'  (1.753 m), weight 91.7 kg (202 lb 2.6 oz), SpO2 98 %. No results found. Results for orders placed or performed during the hospital encounter of 05/18/17 (from the past 72 hour(s))  Glucose, capillary     Status: Abnormal   Collection Time: 06/04/17 12:01 PM  Result Value Ref Range   Glucose-Capillary 210 (H) 65 - 99 mg/dL  Glucose, capillary     Status: None   Collection Time: 06/04/17  5:08 PM  Result Value Ref Range   Glucose-Capillary 74 65 - 99 mg/dL  Glucose, capillary     Status: Abnormal   Collection Time: 06/04/17  9:19 PM  Result Value Ref Range   Glucose-Capillary 128 (H) 65 - 99 mg/dL   Comment 1 Notify RN   CBC with Differential/Platelet     Status: Abnormal   Collection Time: 06/05/17  5:02 AM  Result Value Ref Range   WBC 7.1 4.0 - 10.5 K/uL   RBC 3.09 (L) 4.22 - 5.81 MIL/uL   Hemoglobin 10.2 (L) 13.0 - 17.0 g/dL   HCT 31.2 (L) 39.0 - 52.0 %   MCV 101.0 (H) 78.0 - 100.0 fL   MCH 33.0 26.0 - 34.0 pg   MCHC 32.7 30.0 - 36.0 g/dL   RDW 14.2 11.5 - 15.5 %   Platelets 115 (L) 150 - 400 K/uL    Comment: PLATELET COUNT CONFIRMED BY SMEAR   Neutrophils Relative % 75 %   Neutro Abs 5.4 1.7 - 7.7 K/uL   Lymphocytes Relative 16 %   Lymphs Abs 1.1 0.7 - 4.0 K/uL   Monocytes Relative 7 %   Monocytes Absolute 0.5 0.1 - 1.0 K/uL   Eosinophils Relative 2 %   Eosinophils Absolute 0.1 0.0 - 0.7 K/uL   Basophils Relative 0 %   Basophils Absolute 0.0 0.0 - 0.1 K/uL    Comment: Performed at [facility], [address], [place], [place] [address]  Basic metabolic panel     Status:  Abnormal   Collection Time: 06/05/17  5:02 AM  Result Value Ref Range   Sodium 141 135 - 145 mmol/L   Potassium 4.6 3.5 - 5.1 mmol/L   Chloride 107 101 - 111 mmol/L   CO2 23 22 - 32 mmol/L   Glucose, Bld 116 (H) 65 - 99 mg/dL   BUN 13 6 - 20 mg/dL   Creatinine, Ser 1.12 0.61 - 1.24 mg/dL   Calcium 9.1 8.9 - 10.3 mg/dL   GFR calc non Af Amer >60 >60 mL/min   GFR calc Af Amer >60 >60 mL/min    Comment: (NOTE) The eGFR has been calculated using the CKD EPI equation. This calculation has not been validated in all clinical situations. eGFR's persistently <60 mL/min signify possible Chronic Kidney Disease.    Anion gap 11 5 - 15    Comment: Performed at [facility] [address], [place], [place] [address]  Glucose, capillary     Status: Abnormal   Collection Time: 06/05/17  6:37 AM  Result Value Ref Range   Glucose-Capillary 108 (H) 65 - 99 mg/dL   Comment 1 Notify RN  Glucose, capillary     Status: Abnormal   Collection Time: 06/05/17 11:22 AM  Result Value Ref Range   Glucose-Capillary 163 (H) 65 - 99 mg/dL  Glucose, capillary     Status: Abnormal   Collection Time: 06/05/17  5:16 PM  Result Value Ref Range   Glucose-Capillary 121 (H) 65 - 99 mg/dL  Glucose, capillary     Status: Abnormal   Collection Time: 06/05/17  8:48 PM  Result Value Ref Range   Glucose-Capillary 129 (H) 65 - 99 mg/dL  Glucose, capillary     Status: Abnormal   Collection Time: 06/06/17  6:49 AM  Result Value Ref Range   Glucose-Capillary 121 (H) 65 - 99 mg/dL  Glucose, capillary     Status: Abnormal   Collection Time: 06/06/17  4:45 PM  Result Value Ref Range   Glucose-Capillary 105 (H) 65 - 99 mg/dL   Comment 1 Notify RN   Glucose, capillary     Status: Abnormal   Collection Time: 06/06/17  8:59 PM  Result Value Ref Range   Glucose-Capillary 133 (H) 65 - 99 mg/dL  CBC with Differential/Platelet     Status: Abnormal   Collection Time: 06/07/17  5:05 AM  Result Value Ref Range   WBC  5.1 4.0 - 10.5 K/uL   RBC 2.95 (L) 4.22 - 5.81 MIL/uL   Hemoglobin 9.8 (L) 13.0 - 17.0 g/dL   HCT 29.8 (L) 39.0 - 52.0 %   MCV 101.0 (H) 78.0 - 100.0 fL   MCH 33.2 26.0 - 34.0 pg   MCHC 32.9 30.0 - 36.0 g/dL   RDW 14.2 11.5 - 15.5 %   Platelets 117 (L) 150 - 400 K/uL    Comment: CONSISTENT WITH PREVIOUS RESULT   Neutrophils Relative % 57 %   Neutro Abs 2.9 1.7 - 7.7 K/uL   Lymphocytes Relative 32 %   Lymphs Abs 1.6 0.7 - 4.0 K/uL   Monocytes Relative 7 %   Monocytes Absolute 0.4 0.1 - 1.0 K/uL   Eosinophils Relative 4 %   Eosinophils Absolute 0.2 0.0 - 0.7 K/uL   Basophils Relative 0 %   Basophils Absolute 0.0 0.0 - 0.1 K/uL    Comment: Performed at [facility], [address] [place], [place] [address]  Glucose, capillary     Status: Abnormal   Collection Time: 06/07/17  6:13 AM  Result Value Ref Range   Glucose-Capillary 122 (H) 65 - 99 mg/dL    General: NAD. Well-developed.  HEENT: Normocephalic, atraumatic. Cardio: RRR. No JVD. Resp: CTA Bilaterally. Normal effort   GI: BS positive and ND Musc/Skel:  No edema, no tenderness. Neuro: Alert and oriented 1 Expressive >> Receptive aphasia, improving Motor:  RUE: 2 -/5 proximal to distal RLE: 2/5 proximal to distal Increased tone UE and LE (stable) LUE tremor Dysarthria Skin:   Intact. Warm and dry   Assessment/Plan: 1. Functional deficits secondary to left frontal parietal intraparenchymal hemorrhage with right hemiparesis, aphasia and cognitive deficits which require 3+ hours per day of interdisciplinary therapy in a comprehensive inpatient rehab setting. Physiatrist is providing close team supervision and 24 hour management of active medical problems listed below. Physiatrist and rehab team continue to assess barriers to discharge/monitor patient progress toward functional and medical goals. FIM: Function - Bathing Position: Wheelchair/chair at sink Body parts bathed by patient: Chest, Abdomen, Front  perineal area, Right arm, Buttocks Body parts bathed by helper: Buttocks, Back, Left arm Bathing not applicable: Left lower leg,  Right lower leg, Right upper leg, Left upper leg Assist Level: (Mod assist)  Function- Upper Body Dressing/Undressing What is the patient wearing?: Button up shirt Pull over shirt/dress - Perfomed by patient: Put head through opening, Pull shirt over trunk Pull over shirt/dress - Perfomed by helper: Thread/unthread right sleeve, Thread/unthread left sleeve Button up shirt - Perfomed by patient: Thread/unthread left sleeve, Thread/unthread right sleeve Button up shirt - Perfomed by helper: Pull shirt around back, Button/unbutton shirt Assist Level: (Mod assist) Function - Lower Body Dressing/Undressing What is the patient wearing?: Pants, Shoes Position: Wheelchair/chair at sink Pants- Performed by patient: Thread/unthread right pants leg, Thread/unthread left pants leg, Pull pants up/down Pants- Performed by helper: Thread/unthread right pants leg, Pull pants up/down Non-skid slipper socks- Performed by helper: Don/doff right sock, Don/doff left sock Shoes - Performed by patient: Don/doff right shoe, Don/doff left shoe, Fasten left, Fasten right(doffed) TED Hose - Performed by helper: Don/doff right TED hose, Don/doff left TED hose Assist for footwear: Supervision/touching assist Assist for lower body dressing: Touching or steadying assistance (Pt > 75%)  Function - Toileting Toileting activity did not occur: No continent bowel/bladder event Toileting steps completed by patient: Performs perineal hygiene Toileting steps completed by helper: Adjust clothing prior to toileting, Adjust clothing after toileting Toileting Assistive Devices: Grab bar or rail Assist level: Supervision or verbal cues  Function - Air cabin crew transfer activity did not occur: (not attempted) Toilet transfer assistive device: Walker, Grab bar Assist level to toilet:  Touching or steadying assistance (Pt > 75%) Assist level from toilet: Touching or steadying assistance (Pt > 75%) Assist level to bedside commode (at bedside): Touching or steadying assistance (Pt > 75%) Assist level from bedside commode (at bedside): Touching or steadying assistance (Pt > 75%)  Function - Chair/bed transfer Chair/bed transfer method: Ambulatory, Stand pivot Chair/bed transfer assist level: Touching or steadying assistance (Pt > 75%) Chair/bed transfer assistive device: Walker Chair/bed transfer details: Verbal cues for technique, Verbal cues for precautions/safety, Verbal cues for sequencing, Verbal cues for safe use of DME/AE  Function - Locomotion: Wheelchair Type: Manual Wheelchair activity did not occur: Safety/medical concerns Max wheelchair distance: 100 ft Assist Level: Touching or steadying assistance (Pt > 75%) Wheel 50 feet with 2 turns activity did not occur: Safety/medical concerns Assist Level: Touching or steadying assistance (Pt > 75%) Wheel 150 feet activity did not occur: Safety/medical concerns Function - Locomotion: Ambulation Assistive device: Walker-rolling Max distance: 50 Assist level: Touching or steadying assistance (Pt > 75%) Walk 10 feet activity did not occur: Safety/medical concerns Assist level: Touching or steadying assistance (Pt > 75%) Walk 50 feet with 2 turns activity did not occur: Safety/medical concerns Assist level: Touching or steadying assistance (Pt > 75%) Walk 150 feet activity did not occur: Safety/medical concerns Walk 10 feet on uneven surfaces activity did not occur: Safety/medical concerns  Function - Comprehension Comprehension: Auditory Comprehension assist level: Understands basic 90% of the time/cues < 10% of the time  Function - Expression Expression: Verbal Expression assist level: Expresses basic 75 - 89% of the time/requires cueing 10 - 24% of the time. Needs helper to occlude trach/needs to repeat  words.  Function - Social Interaction Social Interaction assist level: Interacts appropriately 75 - 89% of the time - Needs redirection for appropriate language or to initiate interaction.  Function - Problem Solving Problem solving assist level: Solves basic 75 - 89% of the time/requires cueing 10 - 24% of the time  Function - Memory Memory assistive device:  Other (Comment)(Calendar ) Memory assist level: Recognizes or recalls 50 - 74% of the time/requires cueing 25 - 49% of the time Patient normally able to recall (first 3 days only): That he or she is in a hospital, Location of own room, Staff names and faces  Medical Problem List and Plan: 1.Decreased functional mobilitysecondary to left frontal intraparenchymal hemorrhage with recent deep brain stimulator for tremor.    Cont CIR   WHO/PRAFO 2. DVT Prophylaxis/Anticoagulation: SCDs. Monitor for any signs of DVT 3. Pain Management:Neurontin 300 mg 3 times a day, Tylenol as needed 4. Mood:Provide emotional support 5. Neuropsych: This patientisnot capable of making decisions onhisown behalf.   limit neuro-sedating meds when possible 6. Skin/Wound Care:Routine skin checks 7. Fluids/Electrolytes/Nutrition:Routine I&O's 8.Hypertension.    Cozaar 100 mg daily, decreased to 50 on 2/5   Lasix 20 mg daily d/ced on 2/5.   Propranolol 80 BID, decreased to 40 BID on 2/15 Vitals:   06/06/17 2130 06/07/17 0444  BP: (!) 130/57 (!) 115/56  Pulse: 70 61  Resp:  18  Temp:  98.1 F (36.7 C)  SpO2:  98%    Slightly labile on 2/21 9.CAD with stenting/CABG. No chest pain or shortness of breath 10.Hyperlipidemia. Lipitor,Lovaza 11. Tremors:   mysoline and inderal ongoing   simulator not operational yet, no plans to initiate in near future after discussion with Neuro 12.  Dysphagia   Advanced to D3 thins    Advance diet as tolerated   CXR reviewed, unremarkable for acute process 13. Hypoalb- Prostat 14. Prediabetes   Diet  change to carb modified, CBGs and SSI started on 2/6  Unable to start metformin due to renal function and ?ability to maintain PO fluids   Amaryl started on 2/12   Slightly elevated on 2/21 15. ABLA   Hb 9.8 on 2/21   Cont to monitor 16. Confusion  Multifactorial due hypotension and sleep/wake changes and intracranial bleed/aphasia   CXR reviewed, unremarkable for acute changes   UA unremarkable, urine culture NG 17. Sleep disturbance with agitation  Trazodone 50 qhs scheduled on 2/5, changed to Seroquel 12.5 on 2/7, increased to 25 on 2/12, decreased back to 12.5 due to lethargy on 2/14   Melatonin started on 2/11, increased on 2/14   Risperdal PRN   Improved overall, but labile at times   Educated wife on environment modifications 18. Spastic hemiplegia  Baclofen 5 TID started on 2/7, changed to 5 mg BID and 5m qhs  Cont ROM  Cont bracing 19. AKI  Cr 1.12 on 2/19   Encourage fluids  IVF qhs started on 2/15, DC'd on 2/19   Labs pending 20.  Neurogenic bladder   Flomax increased to 0.8 on 2/11   Bethanechol started on 2/12, increased on 2/21   UCX with enterococcus   Keflex changed to amoxicillin after sensitivities on 2/18 21. Thromboctopenia  Plts 117 on 2/21   Will discuss with pharmacy potential medication culprits, d/ced PPI on 2/19  LOS (Days) 20 A FACE TO FACE EVALUATION WAS PERFORMED  Everlee Quakenbush ALorie Phenix2/21/2019, 8:47 AM

## 2017-06-07 NOTE — Patient Care Conference (Signed)
Inpatient RehabilitationTeam Conference and Plan of Care Update Date: 06/06/2017   Time: 2:30 PM    Patient Name: Brendan [name]      Medical Record Number: [id number]  Date of Birth: 1941/02/16 Sex: Male         Room/Bed: 4M06C/4M06C-01 Payor Info: Payor: Jed [employer]imerick ADVANTAGE / Plan: [employer] Must / Product Type: *No Product type* /    Admitting Diagnosis: L Frontal IPH  Admit Date/Time:  05/18/2017  3:49 PM Admission Comments: No comment available   Primary Diagnosis:  <principal problem not specified> Principal Problem: <principal problem not specified>  Patient Active Problem List   Diagnosis Date Noted  . Labile blood pressure   . Spastic hemiparesis affecting dominant side ([place])   . Thrombocytopenia ([place])   . Agitation   . Labile blood glucose   . Hypotension due to hypovolemia   . Neurogenic bladder   . AKI (acute kidney injury) ([place])   . Dizziness   . Hemiplegia ([place])   . Sleep disturbance   . Confusion, postoperative   . Hypotension due to drugs   . Acute blood loss anemia   . Prediabetes   . Dysphagia   . Benign essential HTN   . Intraparenchymal hemorrhage of brain ([place]) 05/18/2017  . Tremors of nervous system 05/18/2017  . Right sided weakness   . Aphasia   . Intracerebral hemorrhage 05/16/2017  . Cerebral edema ([facility])   . Weakness 05/15/2017  . Coronary artery disease involving native coronary artery of native heart without angina pectoris   . Glaucoma   . Anxiety state   . Cognitive disorder   . Urinary retention   . Traumatic hemorrhage of left cerebrum without loss of consciousness ([place])   . Pressure injury of skin 05/05/2017  . Seborrheic dermatitis of scalp 10/05/2016  . Tremor, essential 10/03/2016  . Cough 06/27/2016  . Rhinitis, allergic 01/12/2014  . CHRONIC STABLE ANGINA 08/06/2013  . Obesity (BMI 30-39.9) 01/30/2013  . Lower extremity edema 01/30/2013  . S/P CABG x 3   . Atherosclerotic heart disease of native coronary artery with  angina pectoris ([place])   . Hyperlipidemia   . Memory problem 07/01/2012  . RSD (reflex sympathetic dystrophy) 05/24/2012  . Pleural plaque with presence of asbestos 05/15/2012  . Anemia, B12 deficiency 03/19/2012  . Atherosclerotic heart disease of artery bypass graft 09/16/2011  . BPH (benign prostatic hyperplasia) 08/08/2011  . Microscopic hematuria 02/27/2011  . Anxiety 10/03/2010  . Incontinence 07/21/2010  . ANGINA, STABLE 04/29/2008  . GERD 10/28/2007  . Essential tremor 08/30/2006  . Essential hypertension 07/06/2006    Expected Discharge Date: Expected Discharge Date: 06/09/17  Team Members Present: Physician leading conference: Dr. Delice Lesch Social Worker Present: Lennart Pall, LCSW Nurse Present: Rayetta Humphrey, RN;Awndrea Troxler, RN PT Present: Roderic Ovens, PT OT Present: Simonne Come, OT SLP Present: Windell Moulding, SLP PPS Coordinator present : Daiva Nakayama, RN, CRRN     Current Status/Progress Goal Weekly Team Focus  Medical   Decreased functional mobility secondary to left frontal intraparenchymal hemorrhage with recent deep brain stimulator for tremor  Improve mobility, safety, cognition, sleep, neurogenic bladder, thrombocytopenia  See above   Bowel/Bladder   incont bowel; lbm 2/18; i&O cath q 8hrs per order; urecholine; pt retains high levels; IV fluids 7p-7a  effective bladder emptying with q8hr cath; offer toileting q 3hr while awake  cath q 8hr for no void per order; toilet q3hr while awake   Swallow/Nutrition/ Hydration   Dys 3, thin  liquids; full supervision   supervision   completion of family education    ADL's   Mod assist bathing and dressing, min assist stand pivot transfers and sit <> stand.  Improved arousal, attention to task, and visual scanning to Rt.  Improved RUE ROM and grasp, however not functional  Min assist overall, dowgnraded LB dressing and dynamic standing to mod assist  dynamic standing balance, LB dressing, family education, transfers    Mobility   min/mod A  mod A  family ed, d/c planning   Communication   min assist  min assist   completion of family education   Safety/Cognition/ Behavioral Observations  mod assist for safety awareness and recall  min assist   completion of family education    Pain   denies  maintain  assess q shift and prn   Skin   MASD to buttocks; DTI to buttocks; barrier cream  free from further breakdown/ infection  assess skin q shift and prn      *See Care Plan and progress notes for long and short-term goals.     Barriers to Discharge  Current Status/Progress Possible Resolutions Date Resolved   Physician    Medical stability;Other (comments)  Cognition  See above  Therapies, adjust sleep/wake, optimize BP/CBG/bladder meds, follow labs      Nursing                  PT                    OT                  SLP                SW                Discharge Planning/Teaching Needs:  Plan for pt to d/c home with wife who can provide only supervision;  daughter who can assist, however, short-term and local agency who can provide a couple of hours/day for ~ 30 days of CNA support.  Teaching to be ongoing.   Team Discussion:  Increased alertness;  Pharm monitoring platelets. Wife actively working with team for family ed and learning caths as well.  PT planning to try "real" car tf on [place].  Has met communication goals.  Good return in UE strength.  Team feels ready for d/c end of week.  Revisions to Treatment Plan:  None    Continued Need for Acute Rehabilitation Level of Care: The patient requires daily medical management by a physician with specialized training in physical medicine and rehabilitation for the following conditions: Daily direction of a multidisciplinary physical rehabilitation program to ensure safe treatment while eliciting the highest outcome that is of practical value to the patient.: Yes Daily medical management of patient stability for increased activity  during participation in an intensive rehabilitation regime.: Yes Daily analysis of laboratory values and/or radiology reports with any subsequent need for medication adjustment of medical intervention for : Neurological problems;Post surgical problems;Diabetes problems;Blood pressure problems;Other  Eulanda Dorion 06/07/2017, 11:58 AM

## 2017-06-07 NOTE — Progress Notes (Addendum)
Occupational Therapy Session Note  Patient Details  Name: Brendan [name] W Kreitz MRN: [id number] Date of Birth: 09/05/1940  Today's Date: 06/07/2017 OT Individual Time: 1005-1105 and 1345-1430 OT Individual Time Calculation (min): 60 min and 45 min   Short Term Goals: Week 3:  OT Short Term Goal 1 (Week 3): STG = LTGs due to remaining LOS  Skilled Therapeutic Interventions/Progress Updates:    1) Treatment session with focus on functional transfers, sit <> stand, standing balance, during self-care tasks of bathing and dressing.  Pt received supine in bed, completed bed mobility with min assist and stand pivot transfer to w/c min assist with RW.  Engaged in bathing/dressing at sit > stand level at sink with pt demonstrating increased functional use of RUE to wash Lt arm, requiring occasional physical assist to ensure thoroughness.  Pt demonstrated ability to wash lower legs, don pants, socks, and shoes in figure 4 position.  Therapist issued elastic shoelaces and pt able to don shoes with increased success this session.  Engaged in hands on transfer training with pt's daughter, she demonstrated safety with transferring pt to/from bathroom with RW with min guard assist. Engaged in furniture transfers, as pt reports sleeping in recliner.  Pt able to complete transfers in/out of recliner with RW with min guard - supervision.  Discussed recommended DME, with plan to follow up with pt's wife this afternoon.  2) Treatment session with focus on sit <> stand, standing tolerance, and functional use of RUE.  Pt completed bed mobility with supervision, donned shoes seated EOB in figure 4 position with increased time.  Stand pivot transfers with RW with min assist and min cues for hand placement.  Engaged in sit <> stand and standing activity at high-low table to promote increased standing tolerance and midline awareness.  Pt completed puzzle in standing for 15 mins before requiring seated rest break.  Incorporated visual  scanning to Rt and use of RUE to obtain puzzle pieces in Rt visual field.  Pt demonstrating improved functional use of RUE, however utilized Lt as assist for fine motor tasks.  Pt propelled w/c with BLE back towards room for strengthening and endurance, then ambulated 50' with RW with min assist.  Pt avoiding obstacles on Rt without cues.  [name] and daughter present and pleased with progress.  Therapy Documentation Precautions:  Precautions Precautions: Fall Precaution Comments: Lt gaze preference, Rt hemi, aphasic, R inattention Restrictions Weight Bearing Restrictions: No Pain: Pain Assessment Pain Assessment: No/denies pain Pain Score: 2   See Function Navigator for Current Functional Status.   Therapy/Group: Individual Therapy  Rosalio LoudHOXIE, Lido Maske 06/07/2017, 12:11 PM

## 2017-06-07 NOTE — Progress Notes (Signed)
Physical Therapy Note  Patient Details  Name: Brendan [name] MRN: [id number] Date of Birth: 09/08/1940 Today's Date: 06/07/2017  1500-1545, 45 min individual tx Pain: none per pt  Pt's LUE tremor apparent this tx.  [name] and dtr [name] present.  Wife stated that ramp will not be completed until next week; she is having a new porch built with ramp. Discussed need for family ed for 4 STE home; dtr BrewsterSandy plans to attend training for that tomorrow.  [facility] with PT, using rolling walker: min assist to enter, mod assist when stepping to R when exiting, due to slightly sloping ground.  Wife performed, with mod cues from PT for simplifying her verbal instructions to him.  Dtr observed and will be present at d/c Sat.  PT demonstrated folding w/c and lifting it.    Pt left resting in w/c with quick release belt applied, alarm set and all needs within reach.  See function navigator for current status.  Marene Gilliam 06/07/2017, 12:28 PM

## 2017-06-08 ENCOUNTER — Inpatient Hospital Stay (HOSPITAL_COMMUNITY): Payer: PPO | Admitting: Occupational Therapy

## 2017-06-08 ENCOUNTER — Inpatient Hospital Stay (HOSPITAL_COMMUNITY): Payer: PPO | Admitting: Physical Therapy

## 2017-06-08 ENCOUNTER — Inpatient Hospital Stay (HOSPITAL_COMMUNITY): Payer: PPO | Admitting: Speech Pathology

## 2017-06-08 LAB — GLUCOSE, CAPILLARY
Glucose-Capillary: 112 mg/dL — ABNORMAL HIGH (ref 65–99)
Glucose-Capillary: 88 mg/dL (ref 65–99)
Glucose-Capillary: 106 mg/dL — ABNORMAL HIGH (ref 65–99)
Glucose-Capillary: 94 mg/dL (ref 65–99)

## 2017-06-08 MED ORDER — QUETIAPINE FUMARATE 25 MG PO TABS: 12.5000 mg | ORAL_TABLET | Freq: Every day | ORAL | 0 refills | Status: DC

## 2017-06-08 MED ORDER — OMEPRAZOLE 20 MG PO CPDR: 20.0000 mg | DELAYED_RELEASE_CAPSULE | Freq: Every day | ORAL | 3 refills | Status: DC | PRN

## 2017-06-08 MED ORDER — PRIMIDONE 50 MG PO TABS: 50.0000 mg | ORAL_TABLET | Freq: Two times a day (BID) | ORAL | 0 refills | Status: DC

## 2017-06-08 MED ORDER — LOSARTAN POTASSIUM 50 MG PO TABS: 50.0000 mg | ORAL_TABLET | Freq: Every day | ORAL | 0 refills | Status: DC

## 2017-06-08 MED ORDER — HYDROCODONE-ACETAMINOPHEN 5-325 MG PO TABS: 1.0000 | ORAL_TABLET | ORAL | 0 refills | Status: DC | PRN

## 2017-06-08 MED ORDER — MELATONIN 3 MG PO TABS: 3.0000 mg | ORAL_TABLET | Freq: Every day | ORAL | 0 refills | Status: DC

## 2017-06-08 MED ORDER — BACLOFEN 10 MG PO TABS: 10.0000 mg | ORAL_TABLET | Freq: Every day | ORAL | 0 refills | Status: DC

## 2017-06-08 MED ORDER — GLIMEPIRIDE 1 MG PO TABS: 1.0000 mg | ORAL_TABLET | Freq: Every day | ORAL | 0 refills | Status: DC

## 2017-06-08 MED ORDER — AMOXICILLIN 500 MG PO CAPS: 500.0000 mg | ORAL_CAPSULE | Freq: Two times a day (BID) | ORAL | 0 refills | Status: DC

## 2017-06-08 MED ORDER — GABAPENTIN 600 MG PO TABS: 300.0000 mg | ORAL_TABLET | Freq: Three times a day (TID) | ORAL | 0 refills | Status: DC

## 2017-06-08 MED ORDER — BACLOFEN 5 MG PO TABS: 5.0000 mg | ORAL_TABLET | Freq: Two times a day (BID) | ORAL | 1 refills | Status: DC

## 2017-06-08 MED ORDER — BETHANECHOL CHLORIDE 25 MG PO TABS: 25.0000 mg | ORAL_TABLET | Freq: Three times a day (TID) | ORAL | 0 refills | Status: DC

## 2017-06-08 MED ORDER — PROPRANOLOL HCL 40 MG PO TABS: 40.0000 mg | ORAL_TABLET | Freq: Two times a day (BID) | ORAL | 0 refills | Status: DC

## 2017-06-08 MED ORDER — DOCUSATE SODIUM 100 MG PO CAPS: 100.0000 mg | ORAL_CAPSULE | Freq: Two times a day (BID) | ORAL | 0 refills | Status: DC

## 2017-06-08 MED ORDER — ATORVASTATIN CALCIUM 40 MG PO TABS: 40.0000 mg | ORAL_TABLET | Freq: Every day | ORAL | 3 refills | Status: DC

## 2017-06-08 MED ORDER — TAMSULOSIN HCL 0.4 MG PO CAPS: 0.8000 mg | ORAL_CAPSULE | Freq: Every day | ORAL | 0 refills | Status: DC

## 2017-06-08 NOTE — Progress Notes (Signed)
Occupational Therapy Discharge Summary  Patient Details  Name: Brendan [name] MRN: [id number] Date of Birth: June 16, 1940  Patient has met 9 of 9 long term goals due to improved activity tolerance, improved balance, postural control, ability to compensate for deficits, functional use of  RIGHT upper and RIGHT lower extremity, improved attention and improved awareness.  Patient to discharge at [facility] Assist level.  Patient's care partner is independent to provide the necessary physical and cognitive assistance at discharge.  Patient has demonstrated tremendous improvement over last few days to overall min assist and even supervision with some self-care tasks.  [name] and daughter have demonstrated ability to provide min assist level care and appropriate cues to increase pt independence and recall of techniques.  Reasons goals not met: N/A  Recommendation:  Patient will benefit from ongoing skilled OT services in home health setting to continue to advance functional skills in the area of BADL and Reduce care partner burden.  Equipment: drop arm BSC  Reasons for discharge: treatment goals met and discharge from hospital  Patient/family agrees with progress made and goals achieved: Yes  OT Discharge Precautions/Restrictions  Precautions Precautions: Fall Restrictions Weight Bearing Restrictions: No General   Vital Signs Therapy Vitals Pulse Rate: (!) 57 BP: (!) 92/43 Pain Pain Assessment Pain Assessment: No/denies pain ADL ADL ADL Comments: Please see functional navigator for ADL status Vision Baseline Vision/History: Glaucoma Additional Comments: Lt gaze preference much improved, pt able to attend to visual stimulus on Rt without cues Perception  Comments: Rt inattention improved, able to visually attend ot stimulus on Rt without cues Cognition Orientation Level: Oriented to person;Oriented to place;Disoriented to situation Sensation Sensation Light Touch: Impaired  Detail Light Touch Impaired Details: Impaired RUE Stereognosis: Not tested Hot/Cold: Not tested Proprioception: Impaired Detail Proprioception Impaired Details: Impaired RUE Additional Comments: Able to identify sensation in Rt arm, however unable to identify specific location to light touch Coordination Gross Motor Movements are Fluid and Coordinated: No Fine Motor Movements are Fluid and Coordinated: No Coordination and Movement Description: tremors in LUE, evolving tremors in Rt Finger Nose Finger Test: Able to approximate movement, unable to touch nose, but able to reach mouth and then out to touch therapist's finger Extremity/Trunk Assessment RUE Assessment RUE Assessment: Exceptions to WFL(Shoulder flexion to 80*, full elbow and wrist ROM, able to complete thumb/finger opposition, loose gross grasp.  Strength grossly 2+/5 in shoulder and 4-/5 at elbow ) LUE Assessment LUE Assessment: Within Functional Limits(tremors)   See Function Navigator for Current Functional Status.  Simonne Come 06/08/2017, 9:28 AM

## 2017-06-08 NOTE — Progress Notes (Signed)
Speech Language Pathology Discharge Summary  Patient Details  Name: Brendan [name] MRN: [id number] Date of Birth: Sep 17, 1940  Today's Date: 06/08/2017 SLP Individual Time: 1005-1100 SLP Individual Time Calculation (min): 55 min   Skilled Therapeutic Interventions:    Pt was seen for skilled ST targeting grad day activities in preparation of tomorrow's discharge.  Upon entering the room, pt was able to spontaneously report at least 3 activities that he completed in OT therapy session from this morning.  Overall, pt's spontaneous expression was significantly improved with pt able to convey both immediate/concrete and more abstract(albeit) simple concepts with supervision cues for word finding.  SLP facilitated the session with a structured picture description task to further address word finding, during which pt need at most min assist verbal cues.  Pt also consumed a therapeutic snack of regular textures and thin liquids with no overt s/s of aspiration with solids or liquids and mod I use of swallowing precautions.  Pt may return to eating a regular diet at home.  Pt was returned to room and left in bed with call bell within reach and wife at bedside.    All wife's questions were answered to her satisfaction at this time.  Pt is ready for discharge tomorrow.     Patient has met 5 of 7 long term goals.  Patient to discharge at [facility] level.  Reasons goals not met:  mod assist needed for recall and problem solving  Clinical Impression/Discharge Summary:   Pt has made excellent gains while inpatient and is discharging having met 5 out of 7 short term goals.  Pt is currently min for tasks due to mild word finding deficits and mild-moderate cognitive deficits and is consuming a regular diet and thin liquids with mod I use of swallowing precautions.  Pt and family education is complete at this time.  Pt has demonstrated improved spontaneous verbal expression up to the phrase/sentence level.  Pt is  discharging home with recommendations for ST follow up at next level of care.  Care Partner:  Caregiver Able to Provide Assistance: Yes  Type of Caregiver Assistance: Physical;Cognitive  Recommendation:  24 hour supervision/assistance;Home Health SLP;Outpatient SLP  Rationale for SLP Follow Up: Maximize cognitive function and independence;Maximize functional communication;Reduce caregiver burden   Equipment: none recommended by SLP    Reasons for discharge: Discharged from hospital   Patient/Family Agrees with Progress Made and Goals Achieved: Yes   Function:  Eating Eating   Modified Consistency Diet: No Eating Assist Level: Set up assist for;More than reasonable amount of time   Eating Set Up Assist For: Opening containers       Cognition Comprehension Comprehension assist level: Understands basic 90% of the time/cues < 10% of the time  Expression   Expression assist level: Expresses basic 90% of the time/requires cueing < 10% of the time.  Social Interaction Social Interaction assist level: Interacts appropriately 90% of the time - Needs monitoring or encouragement for participation or interaction.  Problem Solving Problem solving assist level: Solves basic 75 - 89% of the time/requires cueing 10 - 24% of the time  Memory Memory assist level: Recognizes or recalls 50 - 74% of the time/requires cueing 25 - 49% of the time   Emilio Math 06/08/2017, 11:49 AM

## 2017-06-08 NOTE — Progress Notes (Signed)
Social Work  Discharge Note  The overall goal for the admission was met for:   Discharge location: Yes - home with wife who will be primary caregiver.  Daughter, Lovey Newcomer, also involved and will support, however, only a short amount of FMLA remains for her.  Length of Stay: Yes - 22 days (with discharge on 2/23)  Discharge activity level: Yes - met UPGRADED goals of min assist overall  Home/community participation: Yes  Services provided included: MD, RD, PT, OT, SLP, RN, TR, Pharmacy and [place]: Private Insurance: [employer]  Follow-up services arranged: Home Health: RN, PT, OT, ST via [place], DME: 18x18 lightweight w/c, cushion, rolling walker, drop arm commode via [place], Other: transitional [place] aide services (<30 days) arranged via [facility];  [facility] to follow for care management services;  mail order cath supplies being provided via [place]. and Patient/Family has no preference for HH/DME agencies  Comments (or additional information):  Patient/Family verbalized understanding of follow-up arrangements: Yes  Individual responsible for coordination of the follow-up plan: pt/wife  Confirmed correct DME delivered: [name] 06/08/2017    Callie Facey

## 2017-06-08 NOTE — Progress Notes (Signed)
Late entry Wife, performed I/O catherization with supervision of this nurse and NT w/o difficulties, reassurance provided and support. Reinforced home technique stating she understand.

## 2017-06-08 NOTE — Progress Notes (Signed)
Nutrition Follow-up  DOCUMENTATION CODES:   Obesity unspecified  INTERVENTION:  Recommend continuation of nutritional supplements post discharge especially if po intake becomes inadequate.   NUTRITION DIAGNOSIS:   Inadequate oral intake related to poor appetite as evidenced by meal completion < 50%; improved  GOAL:   Patient will meet greater than or equal to 90% of their needs; met  MONITOR:   PO intake, Supplement acceptance, Labs, Skin, Weight trends  REASON FOR ASSESSMENT:   Malnutrition Screening Tool    ASSESSMENT:   77 y.o.right handed male with history of CAD with stenting as well as CABG, hypertension, memory loss and bilateral deep brain stimulator electrode placement per Dr. Vertell Limber 05/04/2017 for essential tremors.  Meal completion has been varied from 25-100% with 80-100% intake today. Pt currently has Glucerna Shake ordered and has been consuming them. Plans for discharge tomorrow.  Recommend continuation of nutritional supplements post discharge especially if po intake becomes inadequate. Labs and medications reviewed.   Diet Order:  Fall precautions DIET DYS 3 Room service appropriate? Yes; Fluid consistency: Thin  EDUCATION NEEDS:   Not appropriate for education at this time  Skin:  Skin Assessment: Skin Integrity Issues: Skin Integrity Issues:: DTI DTI: buttocks Incisions: head  Last BM:  2/20  Height:   Ht Readings from Last 1 Encounters:  05/18/17 '5\' 9"'$  (1.753 m)    Weight:   Wt Readings from Last 1 Encounters:  06/08/17 195 lb 15.8 oz (88.9 kg)    Ideal Body Weight:  72.72 kg  BMI:  Body mass index is 28.94 kg/m.  Estimated Nutritional Needs:   Kcal:  1900-2100  Protein:  95-110 gm  Fluid:  1.9-2.1 L    Brendan Parker, MS, RD, LDN Pager # [phone] After hours/ weekend pager # [phone]

## 2017-06-08 NOTE — Progress Notes (Signed)
Occupational Therapy Session Note  Patient Details  Name: Brendan [name] MRN: [id number] Date of Birth: 01-Aug-1940  Today's Date: 06/08/2017 OT Individual Time: 1400-1430 OT Individual Time Calculation (min): 30 min    Short Term Goals: Week 2:  OT Short Term Goal 1 (Week 2): Pt will complete toilet transfer with Mod A 1 helper OT Short Term Goal 1 - Progress (Week 2): Met OT Short Term Goal 2 (Week 2): Pt will scan to the Rt for 1 ADL item with mod vcs OT Short Term Goal 2 - Progress (Week 2): Met OT Short Term Goal 3 (Week 2): Pt will complete UB dressing with Mod assist OT Short Term Goal 3 - Progress (Week 2): Met OT Short Term Goal 4 (Week 2): Pt will complete bathing with mod assist and min cues for initiation/sequencing OT Short Term Goal 4 - Progress (Week 2): Met Week 3:  OT Short Term Goal 1 (Week 3): STG = LTGs due to remaining LOS  Skilled Therapeutic Interventions/Progress Updates:    1:1 NMR with focus on functional use of right hand in tasks that including functional reach to encourage shoulder flexion to 90 against gravity, lateral key pinch, grasp strength, FMC to pick up small items, and over hand throwing for coordination and strengthening  (of bean bags). Close supervision w/c to recliner. Left up in recliner with wife present.   Therapy Documentation Precautions:  Precautions Precautions: Fall Precaution Comments: Lt gaze preference, Rt hemi, aphasic, R inattention Restrictions Weight Bearing Restrictions: No General:   Vital Signs: Therapy Vitals Temp: 98.4 F (36.9 C) Temp Source: Oral Pulse Rate: 70 Resp: 18 BP: (!) 125/48 Patient Position (if appropriate): Sitting Oxygen Therapy SpO2: 97 % O2 Device: Not Delivered Pain: Pain Assessment Pain Assessment: No/denies pain ADL: ADL ADL Comments: Please see functional navigator for ADL status  See Function Navigator for Current Functional Status.   Therapy/Group: Individual Therapy  Willeen Cass [facility] 06/08/2017, 2:58 PM

## 2017-06-08 NOTE — Discharge Summary (Signed)
Discharge summary job [id number]

## 2017-06-08 NOTE — Progress Notes (Signed)
Subjective/Complaints: Patient seen lying in bed this morning. When to bedside. She is very happy. She states there is been significant improvement and she is pleased with how well he is doing. She states that he slept well overnight. He appears more interactive as well.  ROS:  Denies CP, SOB, N/V/D.  Objective: Vital Signs: Blood pressure (!) 99/55, pulse (!) 57, temperature 98.3 F (36.8 C), temperature source Oral, resp. rate 16, height '5\' 9"'  (1.753 m), weight 88.9 kg (195 lb 15.8 oz), SpO2 98 %. No results found. Results for orders placed or performed during the hospital encounter of 05/18/17 (from the past 72 hour(s))  Glucose, capillary     Status: Abnormal   Collection Time: 06/05/17 11:22 AM  Result Value Ref Range   Glucose-Capillary 163 (H) 65 - 99 mg/dL  Glucose, capillary     Status: Abnormal   Collection Time: 06/05/17  5:16 PM  Result Value Ref Range   Glucose-Capillary 121 (H) 65 - 99 mg/dL  Glucose, capillary     Status: Abnormal   Collection Time: 06/05/17  8:48 PM  Result Value Ref Range   Glucose-Capillary 129 (H) 65 - 99 mg/dL  Glucose, capillary     Status: Abnormal   Collection Time: 06/06/17  6:49 AM  Result Value Ref Range   Glucose-Capillary 121 (H) 65 - 99 mg/dL  Glucose, capillary     Status: Abnormal   Collection Time: 06/06/17  4:45 PM  Result Value Ref Range   Glucose-Capillary 105 (H) 65 - 99 mg/dL   Comment 1 Notify RN   Glucose, capillary     Status: Abnormal   Collection Time: 06/06/17  8:59 PM  Result Value Ref Range   Glucose-Capillary 133 (H) 65 - 99 mg/dL  CBC with Differential/Platelet     Status: Abnormal   Collection Time: 06/07/17  5:05 AM  Result Value Ref Range   WBC 5.1 4.0 - 10.5 K/uL   RBC 2.95 (L) 4.22 - 5.81 MIL/uL   Hemoglobin 9.8 (L) 13.0 - 17.0 g/dL   HCT 29.8 (L) 39.0 - 52.0 %   MCV 101.0 (H) 78.0 - 100.0 fL   MCH 33.2 26.0 - 34.0 pg   MCHC 32.9 30.0 - 36.0 g/dL   RDW 14.2 11.5 - 15.5 %   Platelets 117 (L) 150 -  400 K/uL    Comment: CONSISTENT WITH PREVIOUS RESULT   Neutrophils Relative % 57 %   Neutro Abs 2.9 1.7 - 7.7 K/uL   Lymphocytes Relative 32 %   Lymphs Abs 1.6 0.7 - 4.0 K/uL   Monocytes Relative 7 %   Monocytes Absolute 0.4 0.1 - 1.0 K/uL   Eosinophils Relative 4 %   Eosinophils Absolute 0.2 0.0 - 0.7 K/uL   Basophils Relative 0 %   Basophils Absolute 0.0 0.0 - 0.1 K/uL    Comment: Performed at [facility], [address] [place], [place] [address]  Glucose, capillary     Status: Abnormal   Collection Time: 06/07/17  6:13 AM  Result Value Ref Range   Glucose-Capillary 122 (H) 65 - 99 mg/dL  Glucose, capillary     Status: None   Collection Time: 06/07/17 11:45 AM  Result Value Ref Range   Glucose-Capillary 99 65 - 99 mg/dL   Comment 1 Notify RN   Basic metabolic panel     Status: Abnormal   Collection Time: 06/07/17  1:41 PM  Result Value Ref Range   Sodium 141 135 -  145 mmol/L   Potassium 4.6 3.5 - 5.1 mmol/L   Chloride 108 101 - 111 mmol/L   CO2 24 22 - 32 mmol/L   Glucose, Bld 130 (H) 65 - 99 mg/dL   BUN 9 6 - 20 mg/dL   Creatinine, Ser 0.95 0.61 - 1.24 mg/dL   Calcium 8.9 8.9 - 10.3 mg/dL   GFR calc non Af Amer >60 >60 mL/min   GFR calc Af Amer >60 >60 mL/min    Comment: (NOTE) The eGFR has been calculated using the CKD EPI equation. This calculation has not been validated in all clinical situations. eGFR's persistently <60 mL/min signify possible Chronic Kidney Disease.    Anion gap 9 5 - 15    Comment: Performed at [facility] [address] [place], [place] [address]  Glucose, capillary     Status: Abnormal   Collection Time: 06/07/17  4:45 PM  Result Value Ref Range   Glucose-Capillary 202 (H) 65 - 99 mg/dL   Comment 1 Notify RN   Glucose, capillary     Status: None   Collection Time: 06/07/17  9:14 PM  Result Value Ref Range   Glucose-Capillary 95 65 - 99 mg/dL   Comment 1 Notify RN   Glucose, capillary     Status: Abnormal   Collection  Time: 06/08/17  6:34 AM  Result Value Ref Range   Glucose-Capillary 112 (H) 65 - 99 mg/dL   Comment 1 Notify RN     General: NAD. Well-developed.  HEENT: Normocephalic, atraumatic. Cardio: RRR. No JVD. Resp: CTA Bilaterally. Normal effort   GI: BS positive and ND Musc/Skel:  No edema, no tenderness. Neuro: Alert and oriented Expressive >> Receptive aphasia, continues to improve Motor:  RUE: 4--4/5 proximal to distal RLE: 4/5 proximal to distal Increased tone UE and LE (improving) LUE tremor Dysarthria Skin:   Intact. Warm and dry   Assessment/Plan: 1. Functional deficits secondary to left frontal parietal intraparenchymal hemorrhage with right hemiparesis, aphasia and cognitive deficits which require 3+ hours per day of interdisciplinary therapy in a comprehensive inpatient rehab setting. Physiatrist is providing close team supervision and 24 hour management of active medical problems listed below. Physiatrist and rehab team continue to assess barriers to discharge/monitor patient progress toward functional and medical goals. FIM: Function - Bathing Position: Wheelchair/chair at sink Body parts bathed by patient: Chest, Abdomen, Front perineal area, Right arm, Buttocks, Right upper leg, Left upper leg, Right lower leg, Left lower leg Body parts bathed by helper: Left arm, Back Bathing not applicable: Left lower leg, Right lower leg, Right upper leg, Left upper leg Assist Level: Touching or steadying assistance(Pt > 75%)  Function- Upper Body Dressing/Undressing What is the patient wearing?: Button up shirt Pull over shirt/dress - Perfomed by patient: Put head through opening, Pull shirt over trunk Pull over shirt/dress - Perfomed by helper: Thread/unthread right sleeve, Thread/unthread left sleeve Button up shirt - Perfomed by patient: Thread/unthread right sleeve, Thread/unthread left sleeve, Pull shirt around back Button up shirt - Perfomed by helper: Button/unbutton  shirt Assist Level: Touching or steadying assistance(Pt > 75%) Function - Lower Body Dressing/Undressing What is the patient wearing?: Pants, Socks, Shoes Position: Wheelchair/chair at sink Pants- Performed by patient: Thread/unthread right pants leg, Thread/unthread left pants leg, Pull pants up/down Pants- Performed by helper: Thread/unthread right pants leg, Pull pants up/down Non-skid slipper socks- Performed by helper: Don/doff right sock, Don/doff left sock Socks - Performed by patient: Don/doff right sock, Don/doff left sock Shoes -  Performed by patient: Don/doff right shoe, Don/doff left shoe, Fasten right, Fasten left TED Hose - Performed by helper: Don/doff right TED hose, Don/doff left TED hose Assist for footwear: Supervision/touching assist Assist for lower body dressing: Touching or steadying assistance (Pt > 75%)  Function - Toileting Toileting activity did not occur: No continent bowel/bladder event Toileting steps completed by patient: Performs perineal hygiene Toileting steps completed by helper: Adjust clothing prior to toileting, Adjust clothing after toileting Toileting Assistive Devices: Grab bar or rail Assist level: Supervision or verbal cues  Function - Air cabin crew transfer activity did not occur: (not attempted) Toilet transfer assistive device: Walker, Grab bar Assist level to toilet: Touching or steadying assistance (Pt > 75%) Assist level from toilet: Touching or steadying assistance (Pt > 75%) Assist level to bedside commode (at bedside): Touching or steadying assistance (Pt > 75%) Assist level from bedside commode (at bedside): Touching or steadying assistance (Pt > 75%)  Function - Chair/bed transfer Chair/bed transfer method: Ambulatory, Stand pivot Chair/bed transfer assist level: Touching or steadying assistance (Pt > 75%) Chair/bed transfer assistive device: Walker Chair/bed transfer details: Verbal cues for technique, Verbal cues for  precautions/safety, Verbal cues for sequencing, Verbal cues for safe use of DME/AE  Function - Locomotion: Wheelchair Type: Manual Wheelchair activity did not occur: Safety/medical concerns Max wheelchair distance: 100 ft Assist Level: Touching or steadying assistance (Pt > 75%) Wheel 50 feet with 2 turns activity did not occur: Safety/medical concerns Assist Level: Touching or steadying assistance (Pt > 75%) Wheel 150 feet activity did not occur: Safety/medical concerns Function - Locomotion: Ambulation Assistive device: Walker-rolling Max distance: 50 Assist level: Touching or steadying assistance (Pt > 75%) Walk 10 feet activity did not occur: Safety/medical concerns Assist level: Touching or steadying assistance (Pt > 75%) Walk 50 feet with 2 turns activity did not occur: Safety/medical concerns Assist level: Touching or steadying assistance (Pt > 75%) Walk 150 feet activity did not occur: Safety/medical concerns Walk 10 feet on uneven surfaces activity did not occur: Safety/medical concerns  Function - Comprehension Comprehension: Auditory Comprehension assist level: Understands basic 90% of the time/cues < 10% of the time  Function - Expression Expression: Verbal Expression assist level: Expresses basic 90% of the time/requires cueing < 10% of the time.  Function - Social Interaction Social Interaction assist level: Interacts appropriately 90% of the time - Needs monitoring or encouragement for participation or interaction.  Function - Problem Solving Problem solving assist level: Solves basic 75 - 89% of the time/requires cueing 10 - 24% of the time  Function - Memory Memory assistive device: Other (Comment)(Calendar ) Memory assist level: Recognizes or recalls 50 - 74% of the time/requires cueing 25 - 49% of the time Patient normally able to recall (first 3 days only): That he or she is in a hospital, Location of own room, Staff names and faces  Medical Problem List  and Plan: 1.Decreased functional mobilitysecondary to left frontal intraparenchymal hemorrhage with recent deep brain stimulator for tremor.    Cont CIR, plan for DC tomorrow   Will see patient for transitional care management in 1-2 weeks   WHO/PRAFO 2. DVT Prophylaxis/Anticoagulation: SCDs. Monitor for any signs of DVT 3. Pain Management:Neurontin 300 mg 3 times a day, Tylenol as needed 4. Mood:Provide emotional support 5. Neuropsych: This patientisnot capable of making decisions onhisown behalf.   limit neuro-sedating meds when possible 6. Skin/Wound Care:Routine skin checks 7. Fluids/Electrolytes/Nutrition:Routine I&O's 8.Hypertension.    Cozaar 100 mg daily, decreased to 50  on 2/5   Lasix 20 mg daily d/ced on 2/5.   Propranolol 80 BID, decreased to 40 BID on 2/15, consider further decreases as outpatient Vitals:   06/07/17 1500 06/08/17 0523  BP: (!) 115/51 (!) 99/55  Pulse: 65 (!) 57  Resp: 19 16  Temp: 98.2 F (36.8 C) 98.3 F (36.8 C)  SpO2: 98% 98%    Labile on 2/22 9.CAD with stenting/CABG. No chest pain or shortness of breath 10.Hyperlipidemia. Lipitor,Lovaza 11. Tremors:   mysoline and inderal ongoing   simulator not operational yet, no plans to initiate in near future after discussion with Neuro 12.  Dysphagia   Advanced to D3 thins    Advance diet as tolerated   CXR reviewed, unremarkable for acute process 13. Hypoalb- Prostat 14. Prediabetes   Diet change to carb modified, CBGs and SSI started on 2/6  Unable to start metformin due to renal function and ?ability to maintain PO fluids   Amaryl started on 2/12   Labile on 2/22 15. ABLA   Hb 9.8 on 2/21   Cont to monitor 16. Confusion  Multifactorial due hypotension and sleep/wake changes and intracranial bleed/aphasia   CXR reviewed, unremarkable for acute changes   UA unremarkable, urine culture NG 17. Sleep disturbance with agitation  Trazodone 50 qhs scheduled on 2/5, changed to Seroquel  12.5 on 2/7, increased to 25 on 2/12, decreased back to 12.5 due to lethargy on 2/14   Melatonin started on 2/11, increased on 2/14   Risperdal PRN   Improved overall, but labile at times   Educated wife on environment modifications 18. Spastic hemiplegia  Baclofen 5 TID started on 2/7, changed to 5 mg BID and 63m qhs  Cont ROM  Cont bracing 19. AKI  Cr 0.95 on 2/21   Encourage fluids  IVF qhs started on 2/15, DC'd on 2/19 20.  Neurogenic bladder   Flomax increased to 0.8 on 2/11   Bethanechol started on 2/12, increased on 2/21   UCX with enterococcus   Keflex changed to amoxicillin after sensitivities on 2/18 21. Thromboctopenia  Plts 117 on 2/21   Will discuss with pharmacy potential medication culprits, d/ced PPI on 2/19   Follow-up as outpatient  LOS (Days) 21 A FACE TO FACE EVALUATION WAS PERFORMED  Ulyana Pitones ALorie Phenix2/22/2019, 8:14 AM

## 2017-06-08 NOTE — Progress Notes (Signed)
Due to patient's BPH which is felt to be prolonged and plan follow-up urology currently performing in an out catheterizations 3 times a day until otherwise advised per urology services.

## 2017-06-08 NOTE — Discharge Instructions (Signed)
Inpatient Rehab Discharge Instructions  [facility] Dis[facility] date and time: No discharge date for patient encounter.   Activities/Precautions/ Functional Status: Activity: activity as tolerated Diet: soft Wound Care: none needed Functional status:  ___ No restrictions     ___ Walk up steps independently ___ 24/7 supervision/assistance   ___ Walk up steps with assistance ___ Intermittent supervision/assistance  ___ Bathe/dress independently ___ Walk with walker     _x__ Bathe/dress with assistance ___ Walk Independently    ___ Shower independently ___ Walk with assistance    ___ Shower with assistance ___ No alcohol     ___ Return to work/school ________      COMMUNITY REFERRALS UPON DISCHARGE:    Home Health:   PT     OT     ST    RN                      Agency: [facility] Phone: [phone]   Medical Equipment/Items Ordered:  Wheelchair, cushion, rolling walker, drop arm commode                                                      Agency/Supplier:  [facility] @ [phone]  Other:  Catheter Supplies via [employer] @ [phone]  Other:  Home health aide via [facility] - contact, Jaquita FoldsHolt Walker @ [phone]       Special Instructions:  In And out catheterizations as directed   My questions have been answered and I understand these instructions. I will adhere to these goals and the provided educational materials after my discharge from the hospital.  Patient/Caregiver Signature _______________________________ Date __________  Clinician Signature _______________________________________ Date __________  Please bring this form and your medication list with you to all your follow-up doctor's appointments.

## 2017-06-08 NOTE — Progress Notes (Signed)
Occupational Therapy Session Note  Patient Details  Name: Brendan [name] MRN: [id number] Date of Birth: 04/03/1941  Today's Date: 06/08/2017 OT Individual Time: 4098-11910835-0930 OT Individual Time Calculation (min): 55 min    Short Term Goals: Week 3:  OT Short Term Goal 1 (Week 3): STG = LTGs due to remaining LOS  Skilled Therapeutic Interventions/Progress Updates:    Completed ADL retraining at overall min assist - supervision level.  Pt received seated upright in w/c reporting need to toilet.  Wife provided min assist with ambulation with RW into bathroom and provided verbal cues for hand placement with sit <> stand.  Bathing and dressing completed at sit > stand level at sink with min cues for technique to increase independence.  Pt supervision for all sit <> stand during LB dressing with improved standing balance and functional use of RUE to pull pants over hips.  Pt utilized RUE to wash Lt arm and to apply deodorant, demonstrating increased range as grasp functionally.  Pt and wife pleased with continued progress with RUE.  Pt and wife report no further questions/concerns prior to d/c tomorrow.  Therapy Documentation Precautions:  Precautions Precautions: Fall Precaution Comments: Lt gaze preference, Rt hemi, aphasic, R inattention Restrictions Weight Bearing Restrictions: No General:   Vital Signs: Therapy Vitals Pulse Rate: (!) 57 BP: (!) 92/43 Pain: Pain Assessment Pain Assessment: No/denies pain  See Function Navigator for Current Functional Status.   Therapy/Group: Individual Therapy  Rosalio LoudHOXIE, Alexios Keown 06/08/2017, 9:28 AM

## 2017-06-08 NOTE — Progress Notes (Signed)
Physical Therapy Discharge Summary  Patient Details  Name: Brendan [name] MRN: [id number] Date of Birth: 05/19/1940  Today's Date: 06/08/2017 PT Individual Time: 4098-1191 PT Individual Time Calculation (min): 55 min   Pt performs bed mobility with min A, supervision for sitting balance with min A to don shoes.  Stand pivot transfers throughout session with RW with supervision.  Gait with RW 150' x 2 with min A.  Gait with obstacle negotiation with stepping over and around obstacles with min A for balance, min/mod multimodal cues for technique and sequencing.  Stair negotiation 4 stairs with 1 railing with min A.  Ramp/curb negotiation and walking in mulch with min A, improved balance strategies noted.  Pt and wife state that they feel comfortable for pt to d/c home tomorrow at this level of care.  W/c mobility with bilat LEs x 150' with supervision. Pt left in room with quick release belt donned,needs at hand.  Patient has met 7 of 7 long term goals due to improved activity tolerance, improved balance, improved postural control, increased strength, ability to compensate for deficits and functional use of  right upper extremity and right lower extremity.  Patient to discharge at an ambulatory level Brendan [name].   Patient's care partner is independent to provide the necessary physical and cognitive assistance at discharge.  Reasons goals not met: n/a  Recommendation:  Patient will benefit from ongoing skilled PT services in home health setting to continue to advance safe functional mobility, address ongoing impairments in strength, balance, gait, and minimize fall risk.  Equipment: w/c, RW, hosptial bed  Reasons for discharge: treatment goals met and discharge from hospital  Patient/family agrees with progress made and goals achieved: Yes  PT Discharge Precautions/Restrictions Precautions Precautions: Fall Restrictions Weight Bearing Restrictions: No Pain Pain Assessment Pain  Assessment: No/denies pain  Cognition Overall Cognitive Status: Impaired/Different from baseline Arousal/Alertness: Awake/alert Orientation Level: Oriented X4 Attention: Selective Selective Attention: Impaired Selective Attention Impairment: Functional basic Memory: Impaired Memory Impairment: Storage deficit Awareness: Impaired Awareness Impairment: Emergent impairment Problem Solving: Impaired Problem Solving Impairment: Functional basic Safety/Judgment: Appears intact Sensation Sensation Light Touch Impaired Details: Impaired RUE Proprioception Impaired Details: Impaired RUE Additional Comments: Able to identify sensation in Rt arm, however unable to identify specific location to light touch Motor  Motor Motor: Abnormal tone;Abnormal postural alignment and control;Hemiplegia Motor - Discharge Observations: Rt lean in sitting, Rt hemiplegia UE>LE, tremulous   Trunk/Postural Assessment  Cervical Assessment Cervical Assessment: (fwd head) Thoracic Assessment Thoracic Assessment: (kyphosis) Lumbar Assessment Lumbar Assessment: (posterior pelvic tilt) Postural Control Postural Control: Deficits on evaluation Righting Reactions: delayed  Balance Static Sitting Balance Static Sitting - Level of Assistance: 6: Modified independent (Device/Increase time) Dynamic Sitting Balance Dynamic Sitting - Level of Assistance: 6: Modified independent (Device/Increase time) Static Standing Balance Static Standing - Level of Assistance: 4: Min assist Dynamic Standing Balance Dynamic Standing - Level of Assistance: 4: Min assist Extremity Assessment  RUE Assessment RUE Assessment: Exceptions to WFL(Shoulder flexion to 80*, full elbow and wrist ROM, able to complete thumb/finger opposition, loose gross grasp.  ) LUE Assessment LUE Assessment: Within Functional Limits(tremors) RLE Strength RLE Overall Strength Comments: grossly 3/5 LLE Assessment LLE Assessment: Within Functional  Limits   See Function Navigator for Current Functional Status.  DONAWERTH,KAREN 06/08/2017, 1:24 PM

## 2017-06-09 LAB — GLUCOSE, CAPILLARY: Glucose-Capillary: 107 mg/dL — ABNORMAL HIGH (ref 65–99)

## 2017-06-09 NOTE — Progress Notes (Addendum)
   Subjective/Complaints: Patient feels well no complaints.   Objective: Vital Signs: Blood pressure (!) 114/56, pulse 60, temperature 97.8 F (36.6 C), temperature source Oral, resp. rate 18, height 5\' 9"  (1.753 m), weight 200 lb 2.8 oz (90.8 kg), SpO2 99 %.  Exam: Well-developed well-nourished in no acute distress. HEENT exam atraumatic, normocephalic Cardiac exam S1 and S2 are regular Chest clear to auscultation Cardiac exam active bowel sounds, soft, nontender Neurologic exam she is alert Extremities without significant edema Skin:   Intact. Warm and dry   Assessment/Plan: 1. Functional deficits secondary to left frontal parietal intraparenchymal hemorrhage with right hemiparesis, aphasia and cognitive deficits   Medical Problem List and Plan: 1.Decreased functional mobilitysecondary to left frontal intraparenchymal hemorrhage with recent deep brain stimulator for tremor.    Cont CIR, plan for DC tomorrow   Will see patient for transitional care management in 1-2 weeks   [facility]  eager to go [facility].  Discharge orders written.   Home health: patient needs  Hospital bed for safe transfers and ability of family to asist at home.  Also needs urinary catheters for I/O cath  See orders LOS (Days) 22 A FACE TO FACE EVALUATION WAS PERFORMED  Bruce H Swords 06/09/2017, 6:41 AM

## 2017-06-09 NOTE — Care Management (Addendum)
[facility] from [facility], Director of Case Management to request assistance with [name]'s discharge planning needs.  Brendan [name] was contacted by Dr. Cato MulliganSwords this morning for assistance with d/c planning.   Patient was reportedly offered hospital bed this week by Brendan [name], CSW and declined.  [name] has decided this am that they do need a hospital bed.    This patient requires a DME hospital bed at home due to intraparenchymal hemorrhage and right hemiparesis.  The head of the patient's bed needs to be elevated to 30 degrees or more at times and the bed needs to be elevated and lowered as patient receives care and repositioning.  Bed ordered from [facility] and will be delivered today or tomorrow.    Update 10:00- Order placed for hospital bed.  Family states they can make arrangements for tonight if bed is not delivered today.    Also, discussed cath supplies with family and bedside RN.  Pt will not receive supplies from company until Tuesday.  Wife has 9 kits.  Bedside RN, Brendan [name], explained procedure and that they should have enough to last until Tuesday.    [facility].   [name]  He states HH has been arranged previously this week with orders on paper.  Left VM for [name], CSW to update.

## 2017-06-09 NOTE — Progress Notes (Signed)
Patient discharged at 1100.

## 2017-06-09 NOTE — Progress Notes (Signed)
RN observed patient's wife preforming patient's straight cath at bedside. All questions answered. Satisfactory cath complete. Dan PA went over discharge instructions yesterday with family. All questions answered. Equipment delivered to room. Patient's wife reports having 9 cath kits which should last until new caths are delivered on Tuesday. Patient stable. Patient to discharge home with all belongings and equipment with wife.

## 2017-06-10 DIAGNOSIS — Z7982 Long term (current) use of aspirin: Secondary | ICD-10-CM | POA: Diagnosis not present

## 2017-06-10 DIAGNOSIS — Z6832 Body mass index (BMI) 32.0-32.9, adult: Secondary | ICD-10-CM | POA: Diagnosis not present

## 2017-06-10 DIAGNOSIS — G8111 Spastic hemiplegia affecting right dominant side: Secondary | ICD-10-CM | POA: Diagnosis not present

## 2017-06-10 DIAGNOSIS — I110 Hypertensive heart disease with heart failure: Secondary | ICD-10-CM | POA: Diagnosis not present

## 2017-06-10 DIAGNOSIS — N319 Neuromuscular dysfunction of bladder, unspecified: Secondary | ICD-10-CM | POA: Diagnosis not present

## 2017-06-10 DIAGNOSIS — I25110 Atherosclerotic heart disease of native coronary artery with unstable angina pectoris: Secondary | ICD-10-CM | POA: Diagnosis not present

## 2017-06-10 DIAGNOSIS — L89320 Pressure ulcer of left buttock, unstageable: Secondary | ICD-10-CM | POA: Diagnosis not present

## 2017-06-10 DIAGNOSIS — D649 Anemia, unspecified: Secondary | ICD-10-CM | POA: Diagnosis not present

## 2017-06-10 DIAGNOSIS — F411 Generalized anxiety disorder: Secondary | ICD-10-CM | POA: Diagnosis not present

## 2017-06-10 DIAGNOSIS — M48061 Spinal stenosis, lumbar region without neurogenic claudication: Secondary | ICD-10-CM | POA: Diagnosis not present

## 2017-06-10 DIAGNOSIS — N453 Epididymo-orchitis: Secondary | ICD-10-CM | POA: Diagnosis not present

## 2017-06-10 DIAGNOSIS — Z969 Presence of functional implant, unspecified: Secondary | ICD-10-CM | POA: Diagnosis not present

## 2017-06-10 DIAGNOSIS — T85190S Other mechanical complication of implanted electronic neurostimulator (electrode) of brain, sequela: Secondary | ICD-10-CM | POA: Diagnosis not present

## 2017-06-10 DIAGNOSIS — L89310 Pressure ulcer of right buttock, unstageable: Secondary | ICD-10-CM | POA: Diagnosis not present

## 2017-06-10 DIAGNOSIS — E785 Hyperlipidemia, unspecified: Secondary | ICD-10-CM | POA: Diagnosis not present

## 2017-06-10 DIAGNOSIS — I25118 Atherosclerotic heart disease of native coronary artery with other forms of angina pectoris: Secondary | ICD-10-CM | POA: Diagnosis not present

## 2017-06-10 DIAGNOSIS — Z951 Presence of aortocoronary bypass graft: Secondary | ICD-10-CM | POA: Diagnosis not present

## 2017-06-10 DIAGNOSIS — K219 Gastro-esophageal reflux disease without esophagitis: Secondary | ICD-10-CM | POA: Diagnosis not present

## 2017-06-10 DIAGNOSIS — F419 Anxiety disorder, unspecified: Secondary | ICD-10-CM | POA: Diagnosis not present

## 2017-06-10 DIAGNOSIS — I509 Heart failure, unspecified: Secondary | ICD-10-CM | POA: Diagnosis not present

## 2017-06-10 DIAGNOSIS — I25798 Atherosclerosis of other coronary artery bypass graft(s) with other forms of angina pectoris: Secondary | ICD-10-CM | POA: Diagnosis not present

## 2017-06-10 DIAGNOSIS — R1310 Dysphagia, unspecified: Secondary | ICD-10-CM | POA: Diagnosis not present

## 2017-06-10 DIAGNOSIS — G250 Essential tremor: Secondary | ICD-10-CM | POA: Diagnosis not present

## 2017-06-10 DIAGNOSIS — R41841 Cognitive communication deficit: Secondary | ICD-10-CM | POA: Diagnosis not present

## 2017-06-10 DIAGNOSIS — E669 Obesity, unspecified: Secondary | ICD-10-CM | POA: Diagnosis not present

## 2017-06-10 DIAGNOSIS — Z77090 Contact with and (suspected) exposure to asbestos: Secondary | ICD-10-CM | POA: Diagnosis not present

## 2017-06-10 DIAGNOSIS — Z87891 Personal history of nicotine dependence: Secondary | ICD-10-CM | POA: Diagnosis not present

## 2017-06-11 ENCOUNTER — Other Ambulatory Visit: Payer: Self-pay | Admitting: *Deleted

## 2017-06-11 ENCOUNTER — Other Ambulatory Visit: Payer: Self-pay

## 2017-06-11 ENCOUNTER — Ambulatory Visit: Payer: PPO | Admitting: Neurology

## 2017-06-11 NOTE — Discharge Summary (Signed)
NAME:  Brendan [name], Brendan [name]                    ACCOUNT NO.:  MEDICAL RECORD NO.:  [id number]  LOCATION:                                 FACILITY:  PHYSICIAN:  Brendan MorrowAnkit Patel, MD        DATE OF BIRTH:  [birth year]  DATE OF ADMISSION:  05/18/2017 DATE OF DISCHARGE:  06/09/2017                              DISCHARGE SUMMARY   DISCHARGE DIAGNOSES: 1. Left frontal intraparenchymal hemorrhage with recent deep brain     stimulator for tremors. 2. SCDs for deep venous thrombosis prophylaxis. 3. Pain management. 4. Hypertension. 5. Coronary artery disease with stenting. 6. Hyperlipidemia. 7. Dysphagia. 8. Prediabetes. 9. Acute blood loss anemia. 10.Sleep disturbance with agitation. 11.Spastic hemiplegia. 12.Acute kidney injury. 13.Neurogenic bladder. 14.Thrombocytopenia.  HISTORY OF PRESENT ILLNESS:  This is a 77 year old right-handed male with history of CAD and stenting as well as CABG, memory loss with bilateral deep brain stimulator electrode placement on May 04, 2017, per Dr. Venetia [name] for tremors.  The patient lives with his wife and needed assistance with ADLs prior to admission.  [facility], ambulating with minimal assist. Presented on May 15, 2017, with increasing right-sided weakness.  CT of the head showed interval evolution of recently seen intraparenchymal hematoma position within the left frontal lobe along the left deep brain stimulator with increased regional edema.  Negative CTA, EEG, no seizure, echocardiogram with ejection fraction of 60%.  No wall motion abnormalities.  Followup MRI on May 17, 2017, with small postoperative hematoma after deep brain stimulator implant and continued to monitored.  He was maintained on a regular diet.  Physical and occupational therapy ongoing.  The patient was admitted for comprehensive rehab program.  PAST MEDICAL HISTORY:  See discharge diagnoses.  SOCIAL HISTORY:   Lives with wife, had recently been in a [facility].  FUNCTIONAL STATUS:  Upon admission, to Rehab Services was moderate assist, 15 feet rolling walker, moderate assist sit to stand, total assist for activities of daily living.  PHYSICAL EXAMINATION:  VITAL SIGNS:  Blood pressure 136/59, pulse 66, temperature 98, and respirations 18. GENERAL:  This is a 77 year old right-handed male, somewhat somnolent, followed simple commands.  Appropriate for simple yes, no's.  He had a mild left-sided tremor. EYES:  EOMs intact. NECK:  Supple.  Nontender.  No JVD. CARDIAC:  Rate controlled. ABDOMEN:  Soft, nontender.  Good bowel sounds. LUNGS:  Clear to auscultation without wheeze.  REHABILITATION AND HOSPITAL COURSE:  The patient was admitted to Inpatient Rehab Services.  Therapies initiated on a 3-hour daily basis, consisting of physical therapy, occupational therapy, speech therapy, and rehabilitation nursing.  The following issues were addressed during the patient's rehabilitation stay.  Pertaining to [name] left frontal intraparenchymal hemorrhage, the patient would follow up with Dr. Venetia [name] of Neurosurgery, SCDs for DVT prophylaxis.  Pain management use of Neurontin 300 mg t.i.d. and monitored.  Blood pressure is controlled.  He had occasional bouts of orthostasis antihypertensive medications, with Cozaar decreased to 50 mg daily.  No chest pain or shortness of breath.  Noted history of CAD with stenting.  His diet  had been advanced to a mechanical soft.  Prediabetes, unable to take Glucophage due to renal dysfunction, placed on low-dose Amaryl and monitored, blood sugar is acceptable.  Acute blood loss anemia felt to be chronic, latest hemoglobin 9.8, no bleeding episodes.  The patient's multifocal bouts of confusion felt to be related to sleep disturbance, intraparenchymal bleed, aphasia.  He was able to attend to his therapies.  Sleep disturbance, he is continued on  Seroquel at bedtime, Risperdal as needed.  He had also been using melatonin.  AKI, creatinine improved to 1.12.  He did receive IV fluids for short time.  Neurogenic bladder, he remained on Flomax, increased to 0.8 mg daily.  Monitoring of blood pressure.  Enterococcus UTI, completing a course of amoxicillin.  He is also continued on Urecholine.  His wife had been instructed on intermittent catheterizations.  He would receive ambulatory referral to Urology Services.  The patient received weekly collaborative interdisciplinary team conferences to discuss estimated length of stay, family teaching, any barriers to discharge.  The patient performed bed mobility with supervision increased time, stand pivot transfers with and without a rolling walker minimal assist, ambulated up to 100 feet supervision rolling walker.  Stair negotiation with his wife.  Minimal assist to supervision simulated car transfers with a rolling walker and minimal assistance.  Activities of daily living and homemaking, sit to stand, standing balance during care and self tasks with minimal assistance.  Working with energy conservation techniques. Full family teaching was completed and plan discharge to home.  DISCHARGE MEDICATIONS: 1. Amoxicillin 500 mg p.o. every 12 hours.  Completing course for UTI. 2. Aspirin 81 mg p.o. daily. 3. Lipitor 40 mg p.o. daily. 4. Baclofen 10 mg p.o. at bedtime, 5 mg p.o. b.i.d. 5. Urecholine 25 mg p.o. t.i.d. 6. Colace 100 mg p.o. b.i.d. 7. Neurontin 300 mg p.o. t.i.d. 8. Amaryl 1 mg at breakfast. 9. Claritin 10 mg daily. 10.Cozaar 50 mg p.o. daily. 11.Melatonin 3 mg p.o. at bedtime. 12.Mysoline 50 mg p.o. b.i.d. 13.Inderal 40 mg p.o. b.i.d. 14.Seroquel 12.5 mg at supper. 15.Flomax 0.8 mg daily at supper. 16.Hydrocodone 1 tablet every 4 hours as needed.  DIET:  Mechanical soft.  FOLLOWUP:  The patient would follow up with Dr. Maryla [name] at the Outpatient Rehab Service office  as directed; Dr. Maeola [name] call for appointment; Dr. Bryan [name], Cardiology Service, call for appointment; Dr. Nelson [name], medical management.  SPECIAL INSTRUCTIONS:  Continue in and out catheterization as directed, ambulatory referral, obtain Urology Services.     Mariam Dollar, P.A.   ______________________________ Brendan Morrow, MD    DA/MEDQ  D:  06/08/2017  T:  06/08/2017  Job:  [id number]  cc:   Brendan Morrow, MD Danae Orleans. Brendan Maxon, M.D. Landry Corporal, MD

## 2017-06-11 NOTE — Patient Outreach (Signed)
Transition of care: Placed call to patient for initial contact. Spoke with wife, who reports that patient is doing great. Reports the is walking without a walker right now. Confirms that he has received his hospital bed.  Wife reports no problems with medications at this time.  Wife states patient has a follow up planned with primary MD this week.   Reviewed transition of care program. Offered home visit for tomorrow and wife has accepted.   PLAN: Home visit tomorrow. Will establish care plan after talking with patient.Rowe PavyAmanda Cook, RN, BSN, CEN [facility] Care Coordinator [phone]

## 2017-06-12 ENCOUNTER — Telehealth: Payer: Self-pay

## 2017-06-12 ENCOUNTER — Other Ambulatory Visit: Payer: Self-pay

## 2017-06-12 NOTE — Patient Outreach (Signed)
[facility]) Care Management  06/12/2017  [name] W Bynum 05/22/1940 [id number]   CSW attempted to contact patient at home on 06/11/17. Per daughter, he is in the bathroom and wife is assisting. CSW provided return contact info and will await callback from patient/wife or try again 06/12/17.  Reece LevyJanet Monnie Anspach, MSW, LCSW Clinical Social Worker  [facility] [phone]

## 2017-06-12 NOTE — Patient Outreach (Signed)
[facility]) Care Management  [facility] Care [facility]  06/12/2017   [name] W Geremia 05/22/1940 [id number]  Subjective: Wife reports,  "he is doing so well, I am proud of him"  Objective: [facility] CSW to assist patient and family with community based resources to aide in their well-being, quality of life and overall safety and needs.    Encounter Medications:  Outpatient Encounter Medications as of 06/11/2017  Medication Sig Note  . amoxicillin (AMOXIL) 500 MG capsule Take 1 capsule (500 mg total) by mouth every 12 (twelve) hours.   [name]. aspirin 81 MG chewable tablet Chew 1 tablet (81 mg total) by mouth daily.   [name]. atorvastatin (LIPITOR) 40 MG tablet Take 1 tablet (40 mg total) by mouth daily.   . baclofen (LIORESAL) 10 MG tablet Take 1 tablet (10 mg total) by mouth at bedtime.   . Baclofen 5 MG TABS Take 5 mg by mouth 2 (two) times daily.   . bethanechol (URECHOLINE) 25 MG tablet Take 1 tablet (25 mg total) by mouth 3 (three) times daily.   [name]. docusate sodium (COLACE) 100 MG capsule Take 1 capsule (100 mg total) by mouth 2 (two) times daily.   . fish oil-omega-3 fatty acids 1000 MG capsule Take 1 g by mouth 2 (two) times daily.   [name]. gabapentin (NEURONTIN) 600 MG tablet Take 0.5 tablets (300 mg total) by mouth 3 (three) times daily.   [name]. glimepiride (AMARYL) 1 MG tablet Take 1 tablet (1 mg total) by mouth daily with breakfast.   . HYDROcodone-acetaminophen (NORCO/VICODIN) 5-325 MG tablet Take 1 tablet by mouth every 4 (four) hours as needed for severe pain.   [name]. ketoconazole (NIZORAL) 2 % shampoo Apply 1 application topically 2 (two) times a week. (Patient taking differently: Apply 1 application topically once a week. ) 05/15/2017: WEDNESDAYS after whirlpool  . loratadine (CLARITIN) 10 MG tablet Take 1 tablet (10 mg total) by mouth daily.   [name]. losartan (COZAAR) 50 MG tablet Take 1 tablet (50 mg total) by mouth daily.   . Melatonin 3 MG TABS Take 1 tablet (3 mg total) by mouth at bedtime.   .  nitroGLYCERIN (NITROSTAT) 0.4 MG SL tablet Place 1 tablet (0.4 mg total) under the tongue every 5 (five) minutes as needed for chest pain. call 911 if chest pain not better   . omeprazole (PRILOSEC) 20 MG capsule Take 1 capsule (20 mg total) by mouth daily as needed (acid reflux).   . primidone (MYSOLINE) 50 MG tablet Take 1 tablet (50 mg total) by mouth 2 (two) times daily.   . propranolol (INDERAL) 40 MG tablet Take 1 tablet (40 mg total) by mouth 2 (two) times daily.   [name]. Propylene Glycol (SYSTANE BALANCE) 0.6 % SOLN Place 1 drop into both eyes daily as needed (for dry eyes).   . QUEtiapine (SEROQUEL) 25 MG tablet Take 0.5 tablets (12.5 mg total) by mouth daily after supper.   . tamsulosin (FLOMAX) 0.4 MG CAPS capsule Take 2 capsules (0.8 mg total) by mouth daily after supper.    Facility-Administered Encounter Medications as of 06/11/2017  Medication  . 0.9 %  sodium chloride infusion  . sodium chloride 0.9 % injection 3 mL    Functional Status:  In your present state of health, do you have any difficulty performing the following activities: 05/26/2017 05/19/2017  Hearing? - Y  Vision? - N  Difficulty concentrating or making decisions? - Y  Walking or climbing stairs? Y -  Dressing or  bathing? - Y  Doing errands, shopping? Y -  Some recent data might be hidden    Fall/Depression Screening: Fall Risk  04/20/2017 03/06/2017 02/13/2017  Falls in the past year? Yes Yes No  Number falls in past yr: 1 1 -  Injury with Fall? No No -  Risk for fall due to : - - -  Follow up Falls evaluation completed Falls prevention discussed -   PHQ 2/9 Scores 03/06/2017 11/28/2016 10/05/2016 08/24/2016 01/05/2016 11/30/2015 06/08/2015  PHQ - 2 Score 0 0 0 0 0 0 0    Assessment: CSW was able to make contact with patient and wife today by phone. CSW introduced self, explained role and types of services provided through [employer] Care Management [facility] Care Management).  CSW further explained the  reason for the call for follow up post hospital and Rehab stay.  Per patient's wife, he is progressing and doing well. She has made arrangements for ramp to be built and has "lots of people involved and helping".  She shared that they have home health and bathing assistance services involved and already engaged.  CSW offered a home visit to assess and provide CSW support and resources and wife pleasantly declines stating no needs or concerns.  CSW advised that they may also hear from [facility] and/or [facility] and offered to follow up in one week to check in again before closing CSW referral.    Plan: phone f/u call next week.   Reece Levy, MSW, LCSW Clinical Social Worker  [facility] [phone]

## 2017-06-12 NOTE — Patient Outreach (Signed)
[facility]) Care Management   06/12/2017  Brendan [name] 1941-02-25 [id number]  Brendan [name] is an 77 y.o. male Arrived for home visit. Wife and daughter [name] present.  Subjective: Patient reports that he was having the second part of a stimulator placed and had a brain bleed.  Reports bleeding and swelling affected his right side.  Reports he has been home from rehab for 4 days. Reports that he is doing well. Walking unassisted.  Reports that he has some difficulty with bathing. Reports the he is unable to empty his bladder and has to be catheterized.  Reports that he is eating and sleeping well. Has a hospital bed.  Per daughter and wife home health PT and OT has not contacted patient.   Objective:  Awake and pleasant.   Tremors noted during home visit involving hands and legs.  Ambulating well without any assistive devices. Able to walk up and down outside steps without difficulty.   Vitals:   06/12/17 1354  BP: (!) 132/58  Pulse: 64  Resp: 18  SpO2: 98%  Weight: 195 lb (88.5 kg)  Height: 1.676 m (5\' 6" )   Review of Systems  Constitutional: Negative.   Eyes:       Wears glasses.   Respiratory: Negative.   Cardiovascular: Positive for leg swelling.  Gastrointestinal: Negative.   Genitourinary:       In and out cath 3 times per day.  Able to void some on his own.   Skin:       Deep tissue bruising for 2 years.   Neurological: Positive for dizziness, tremors and focal weakness.  Endo/Heme/Allergies: Negative.   Psychiatric/Behavioral: Negative.     Physical Exam  Constitutional: He appears well-developed and well-nourished.  Cardiovascular: Normal rate, normal heart sounds and intact distal pulses.  Respiratory: Effort normal and breath sounds normal.  GI: Soft. Bowel sounds are normal.  Musculoskeletal: Normal range of motion. He exhibits edema.  1 plus edema  Neurological: He is alert.  Obama,  Able to verbalize date of birth,   Unable to verbalize  date, month, year.  Able to verbalize address. .   Right sided hand weakness in comparison to the left.  Able to wiggle toes upon command.   Skin: Skin is warm and dry.  Feet without open sores and lesions.   Right sideded buttock with discoloration. No skin break down.   Psychiatric: He has a normal mood and affect. His behavior is normal. Judgment and thought content normal.    Encounter Medications:   Outpatient Encounter Medications as of 06/12/2017  Medication Sig Note  . amoxicillin (AMOXIL) 500 MG capsule Take 1 capsule (500 mg total) by mouth every 12 (twelve) hours.   [name] aspirin 81 MG chewable tablet Chew 1 tablet (81 mg total) by mouth daily.   [name] atorvastatin (LIPITOR) 40 MG tablet Take 1 tablet (40 mg total) by mouth daily.   . baclofen (LIORESAL) 10 MG tablet Take 1 tablet (10 mg total) by mouth at bedtime.   . Baclofen 5 MG TABS Take 5 mg by mouth 2 (two) times daily.   . bethanechol (URECHOLINE) 25 MG tablet Take 1 tablet (25 mg total) by mouth 3 (three) times daily.   [name] gabapentin (NEURONTIN) 600 MG tablet Take 0.5 tablets (300 mg total) by mouth 3 (three) times daily.   [name] glimepiride (AMARYL) 1 MG tablet Take 1 tablet (1 mg total) by mouth daily with breakfast.   . HYDROcodone-acetaminophen (NORCO/VICODIN)  5-325 MG tablet Take 1 tablet by mouth every 4 (four) hours as needed for severe pain.   [name] ketoconazole (NIZORAL) 2 % shampoo Apply 1 application topically 2 (two) times a week. (Patient taking differently: Apply 1 application topically once a week. ) 05/15/2017: WEDNESDAYS after whirlpool  . loratadine (CLARITIN) 10 MG tablet Take 1 tablet (10 mg total) by mouth daily.   [name] losartan (COZAAR) 50 MG tablet Take 1 tablet (50 mg total) by mouth daily.   . Melatonin 3 MG TABS Take 1 tablet (3 mg total) by mouth at bedtime.   [name] omeprazole (PRILOSEC) 20 MG capsule Take 1 capsule (20 mg total) by mouth daily as needed (acid reflux).   . primidone (MYSOLINE) 50 MG tablet Take 1 tablet  (50 mg total) by mouth 2 (two) times daily.   . propranolol (INDERAL) 40 MG tablet Take 1 tablet (40 mg total) by mouth 2 (two) times daily.   [name] Propylene Glycol (SYSTANE BALANCE) 0.6 % SOLN Place 1 drop into both eyes daily as needed (for dry eyes).   . QUEtiapine (SEROQUEL) 25 MG tablet Take 0.5 tablets (12.5 mg total) by mouth daily after supper.   . tamsulosin (FLOMAX) 0.4 MG CAPS capsule Take 2 capsules (0.8 mg total) by mouth daily after supper.   . docusate sodium (COLACE) 100 MG capsule Take 1 capsule (100 mg total) by mouth 2 (two) times daily. 06/12/2017: Takes as needed.   . fish oil-omega-3 fatty acids 1000 MG capsule Take 1 g by mouth 2 (two) times daily.   . nitroGLYCERIN (NITROSTAT) 0.4 MG SL tablet Place 1 tablet (0.4 mg total) under the tongue every 5 (five) minutes as needed for chest pain. call 911 if chest pain not better (Patient not taking: Reported on 06/12/2017) 06/12/2017: Uses as needed. None since hospital discharge   Facility-Administered Encounter Medications as of 06/12/2017  Medication  . 0.9 %  sodium chloride infusion  . sodium chloride 0.9 % injection 3 mL    Functional Status:   In your present state of health, do you have any difficulty performing the following activities: 06/12/2017 05/26/2017  Hearing? Y -  Vision? N -  Difficulty concentrating or making decisions? Y -  Walking or climbing stairs? N Y  Dressing or bathing? Y -  Doing errands, shopping? Malvin Johns  Preparing Food and eating ? N -  Using the Toilet? N -  In the past six months, have you accidently leaked urine? Y -  Do you have problems with loss of bowel control? Y -  Managing your Medications? N -  Managing your Finances? N -  Housekeeping or managing your Housekeeping? N -  Some recent data might be hidden    Fall/Depression Screening:    Fall Risk  06/12/2017 04/20/2017 03/06/2017  Falls in the past year? Yes Yes Yes  Number falls in past yr: 1 1 1   Injury with Fall? No No No  Risk for  fall due to : - - -  Follow up Falls prevention discussed Falls evaluation completed Falls prevention discussed   PHQ 2/9 Scores 06/12/2017 03/06/2017 11/28/2016 10/05/2016 08/24/2016 01/05/2016 11/30/2015  PHQ - 2 Score 0 0 0 0 0 0 0    Assessment:   (1) Reviewed [facility] program and transition of care. Patient has welcome packet that was provided in the hospital.  Reviewed consent and patient declines any needed changes.  Provided [facility] calendar,  Health team advantage magnet and my contact card. (2) good  family support with wife and daughter ( who is a Engineer, civil (consulting)nurse) (3) follow up appointments made and patient has transportation. (4) Home health PT and OT has not contacted patient.   (5) has a health care power or attorney but no living will.  (6) right sided weakness and tremors from recent complications from surgery.  (7) reviewed with patient and family when to call 911 for changes in condition. (8) new to needing in and out catheterizations.   Plan:  (1) consent in medical record. Will contact patient in 1 week. Encouraged patient and family to call sooner if needed. (2)  Encouraged wife and daughter to continue to offer support but also allow patient to do things himself. Like walking outside up and down the steps which he is able to do independently.  (3) Encouraged patient to attend all follow up appointments. (4) placed call to [facility] and was informed therapy would not start for 1 week. I informed [facility] that they needed to reassign case to start therapy immediately due to no therapy for 4 days.  Spoke to Casimiro NeedleMichael who will have a therapist call patient and schedule for 06/13/2017.  Informed patient and family to call me if they did not hear back from [facility] about appointment.  (5) provided advanced directive packet and reviewed with patient and family. Encouraged completion of living will.  (6) Encouraged patient to remain active and do home exercises.  (7)  reviewed with patient and family risk of MI and CVA. Reviewed plan of care for patient to call 911 for any symptoms of heart attack or stroke. (8) Reviewed sterile technique.  Reviewed signs and symptoms of infections to inslude fever, chills, cloudy urine, foul odor or urine, back pain and or confusion.  Encouraged wife to call MD for any changes in condition.   Next follow up in 1 week. Reviewed with patient and or family to call sooner if needed.  This note and barrier letter sent to MD.  [facility] CM Care Plan Problem One     Most Recent Value  Care Plan Problem One  Complications from brain stimulator insertion as evidence by focal deficits  Role Documenting the Problem One  Care Management Coordinator  Care Plan for Problem One  Active  [facility] Long [facility] Goal   Patient will report increased strength to the right side in the next 60 days.   [facility] Long Term Goal Start Date  06/12/17  Interventions for Problem One Long Term Goal  Home visit completed. Reviewed transition of care program. Reviewed signs and symptoms of stroke and heart attack.   [facility] CM Short Term Goal #1   Patient and or family will recognize signs and symptoms of UTI in the enxt 30 days.   [facility] CM Short Term Goal #1 Start Date  06/12/17  Interventions for Short Term Goal #1  Reviewed sterile technique and its importance with in and out catheterizations.  Reviewed with patient when to call MD.   [facility] Short Term Goal #2   Patient will report arrival of home health PT and OT in 1 week.   [facility] CM Short Term Goal #2 Start Date  06/12/17  Interventions for Short Term Goal #2  Placed  call to [facility] to enusre orders for services. Reviewed urgency of starting treatment plan. Reviewed with patient and family to call me back if thet are not contacted.      Rowe PavyAmanda Finlee Milo, RN, BSN, CEN [facility]  [facility] Care Coordinator [phone]

## 2017-06-12 NOTE — Telephone Encounter (Signed)
Transitional Care call  Patient name: Brendan [name], [name]) DOB: (11/20/1940) 1. Are you/is patient experiencing any problems since coming home? (NO) a. Are there any questions regarding any aspect of care? (NO) 2. Are there any questions regarding medications administration/dosing? (NO) a. Are meds being taken as prescribed? (YES) b. "Patient should review meds with caller to confirm"  3. Have there been any falls? (NO) 4. Has Home Health been to the house and/or have they contacted you? (YES) a. If not, have you tried to contact them? (NA) b. Can we help you contact them? (NA) 5. Are bowels and bladder emptying properly? (NO = STILL USING IN/OUT CATHS / HAS TO USE STOOL SOFTNER PERIODICALY FOR BOWEL MOVEMENTS) a. Are there any unexpected incontinence issues? (NO) b. If applicable, is patient following bowel/bladder programs? (NA) 6. Any fevers, problems with breathing, unexpected pain? (NO) 7. Are there any skin problems or new areas of breakdown? (STILL RED/BRUISED LOOKING, IS IMPROVING, USING BARRIOR CREAM) 8. Has the patient/family member arranged specialty MD follow up (ie cardiology/neurology/renal/surgical/etc.)?  (YES) a. Can we help arrange? (NO) 9. Does the patient need any other services or support that we can help arrange? (NO) 10. Are caregivers following through as expected in assisting the patient? (YES) 11. Has the patient quit smoking, drinking alcohol, or using drugs as recommended? (NA)  Appointment date/time (06-21-2017 / 220PM), arrive time (200PM) and who it is with here Brendan Lefevre(EUNICE THOMAS NP) [phone]

## 2017-06-13 ENCOUNTER — Telehealth: Payer: Self-pay | Admitting: Family Medicine

## 2017-06-13 NOTE — Telephone Encounter (Signed)
Called Marylene LandAngela with [facility] and gave verbal orders.

## 2017-06-13 NOTE — Telephone Encounter (Signed)
Marylene LandAngela with [facility] called needing verbal orders for wound care for this patient He has a deep tissue wound on his  bilateral buttock. The wound care nurse recommended using a secure protective ointment twice a day and to cleanse with soap and water. Please call Marylene Landngela at [phone] to give verbal orders.

## 2017-06-14 ENCOUNTER — Encounter: Payer: Self-pay | Admitting: Family Medicine

## 2017-06-14 ENCOUNTER — Telehealth: Payer: Self-pay | Admitting: Psychology

## 2017-06-14 ENCOUNTER — Telehealth: Payer: Self-pay | Admitting: Neurology

## 2017-06-14 ENCOUNTER — Other Ambulatory Visit: Payer: Self-pay

## 2017-06-14 ENCOUNTER — Ambulatory Visit (INDEPENDENT_AMBULATORY_CARE_PROVIDER_SITE_OTHER): Payer: PPO | Admitting: Family Medicine

## 2017-06-14 ENCOUNTER — Telehealth: Payer: Self-pay

## 2017-06-14 VITALS — BP 114/62 | HR 61 | Temp 97.6°F | Ht 66.0 in | Wt 201.6 lb

## 2017-06-14 DIAGNOSIS — R7303 Prediabetes: Secondary | ICD-10-CM

## 2017-06-14 DIAGNOSIS — S06350D Traumatic hemorrhage of left cerebrum without loss of consciousness, subsequent encounter: Secondary | ICD-10-CM

## 2017-06-14 DIAGNOSIS — G250 Essential tremor: Secondary | ICD-10-CM | POA: Diagnosis not present

## 2017-06-14 DIAGNOSIS — N179 Acute kidney failure, unspecified: Secondary | ICD-10-CM | POA: Diagnosis not present

## 2017-06-14 DIAGNOSIS — Z09 Encounter for follow-up examination after completed treatment for conditions other than malignant neoplasm: Secondary | ICD-10-CM

## 2017-06-14 DIAGNOSIS — I619 Nontraumatic intracerebral hemorrhage, unspecified: Secondary | ICD-10-CM | POA: Diagnosis not present

## 2017-06-14 DIAGNOSIS — G8110 Spastic hemiplegia affecting unspecified side: Secondary | ICD-10-CM | POA: Diagnosis not present

## 2017-06-14 DIAGNOSIS — R339 Retention of urine, unspecified: Secondary | ICD-10-CM | POA: Diagnosis not present

## 2017-06-14 NOTE — Telephone Encounter (Signed)
Brendan [facility]Julie ST [facility] requesting verbal orders for ST for 1xwk X 1wk followed by 2xwk X 3wks followed by 1xwk X 1wk.   Called her back and approved verbal orders.

## 2017-06-14 NOTE — Telephone Encounter (Signed)
Called patient to see how doing and wife answered.  They were at doctors office.  Stated pt is doing remarkably well and they expressed appreciation for the call.  Will be here Monday.

## 2017-06-14 NOTE — Progress Notes (Signed)
Subjective:   Patient ID: Brendan BoJerry W Hartsfield    DOB: 05/16/1940, 77 y.o. male   MRN: [id number]  CC: hospital follow-up   HPI: Brendan [name] is a 77 y.o. male who presents to clinic today for hospital follow up.  Patient is present with his wife and daughter at today's visit.  He was recently admitted to the hospital on May 04, 2017 for deep brain stimulation surgery and had a prolonged hospital course.  Postoperatively, patient experiencing some confusion and CT brain revealed a small parenchymal hemorrhage along left DBS lead.  MRI on 05/17/17 demonstrated probable postop hematoma.  Although he went to subacute nursing facility for rehab, he  was readmitted to the hospital and completed a comprehensive rehab program.   Strength and speech has significantly improved with rehabilitation, per wife and daughter.  Discharged home on 06/09/17.   Physical, occupational and speech therapy are currently ongoing at home.  I have signed orders for wound care yesterday with [facility].  Overall, he and his family are pleased with his progress.  No fevers, chills, nausea, vomiting.  No lightheadedness, dizziness or headache.  He endorses some difficulty with reading and states his vision is blurry, but otherwise feels he is doing well. Gait is somewhat unsteady -  he has a wheelchair at home that he rarely needs, and is using a walker PRN.   Wife states he is still having tremors because he has not IPG placed due to complications of surgery.  Per Dr. Venetia MaxonStern, plan to place IPG in May.   Additionally, he was started on antibiotics for UTI during hospitalization.  Course of amoxicillin has been completed and urinary symptoms have resolved.  Wife has been cathing him for the past 6 days.  He denies any burning with urination.  Denies frequency and urgency.  Wife states his urine is no longer cloudy and does not have an odor.  He has urology follow-up as outpatient with Dr. Clent RidgesBudzen.    ROS:  Denies fever, chills,  nausea, vomiting.  Denies lightheadedness, dizziness, headache. +tremor.   Social: Patient is a former smoker, quit 1970.  Medications reviewed. Objective:   BP 114/62   Pulse 61   Temp 97.6 F (36.4 C) (Oral)   Ht 5\' 6"  (1.676 m)   Wt 201 lb 9.6 oz (91.4 kg)   SpO2 96%   BMI 32.54 kg/m  Vitals and nursing note reviewed.  General: pleasant 77 yo male, NAD  HEENT: NCAT, bilateral frontal incisions with good healing, EOMI, PERRL, MMM, o/p clear  Neck: supple, no LAD CV: RRR no MRG  Lungs: CTAB, normal effort  Skin: warm, dry, no rash Extremities: warm and well perfused Neuro: alert, oriented x3, facial symmetry present, CN II-XII intact grossly, R upper extremity strength 4/5 vs left 5/5 , lower extremity strength 5/5 bilaterally, mild tremor present speech normal, normal tone  Assessment & Plan:   Essential tremor S/p DBS surgery on last hospitalization with complication of postop hematoma.  Tremor remains present at this time. Following with Neurology and Dr. Venetia MaxonStern as outpatient with plan to place IPG in May  -continue to monitor   Prediabetes A1c borderline at 5.8 on 1/30. During hospitalization he was on insulin and discharged on Amaryl.  No symptoms of hypoglycemia.  Given his age and comorbidities, will hold off on tight control.  Discussed discontinuing for now with plan to recheck in 3 months - pt and family agreeable to this.    -f/u  A1c 3 mo  AKI (acute kidney injury) (HCC) BMP today, AKI resolved to SCr 1.03   Traumatic hemorrhage of left cerebrum without loss of consciousness (HCC) Will obtain f/u CBC today to ensure Hgb stable  Orders Placed This Encounter  Procedures  . CBC  . Basic Metabolic Panel   Follow up: 3 months  [name], MD [facility], PGY-2 06/19/2017 8:39 PM

## 2017-06-14 NOTE — Patient Instructions (Addendum)
You were seen in clinic today for hospital follow-up.  We reviewed your discharge instructions and I have ordered some labs to check your kidney function and your hemoglobin.  I am glad you are slowly regaining strength and would like you to continue working with physical therapy, occupational and speech therapy.  Please make sure you keep your follow-up appointments with urology and neurology to manage your new medications.  Call clinic if you have any questions.  Be well, Freddrick MarchYashika Kilo Eshelman, MD

## 2017-06-14 NOTE — Telephone Encounter (Signed)
Telephone call to patient's wife to check in and see how they are doing.   She reports patient is doing well.  She reports that at this time they are very well supported with home health services and then personal care services through [facility]. We will see them next week at her husband's appointment with Dr. Arbutus Leasat.

## 2017-06-15 LAB — BASIC METABOLIC PANEL
BUN/Creatinine Ratio: 11 (ref 10–24)
BUN: 11 mg/dL (ref 8–27)
CO2: 23 mmol/L (ref 20–29)
Calcium: 9.1 mg/dL (ref 8.6–10.2)
Chloride: 104 mmol/L (ref 96–106)
Creatinine, Ser: 1.03 mg/dL (ref 0.76–1.27)
GFR calc Af Amer: 82 mL/min/{1.73_m2} (ref >59)
GFR calc non Af Amer: 71 mL/min/{1.73_m2} (ref >59)
Glucose: 88 mg/dL (ref 65–99)
Potassium: 5.2 mmol/L (ref 3.5–5.2)
Sodium: 141 mmol/L (ref 134–144)

## 2017-06-15 LAB — CBC
Hematocrit: 32 % — ABNORMAL LOW (ref 37.5–51.0)
Hemoglobin: 10.6 g/dL — ABNORMAL LOW (ref 13.0–17.7)
MCH: 33.3 pg — ABNORMAL HIGH (ref 26.6–33.0)
MCHC: 33.1 g/dL (ref 31.5–35.7)
MCV: 101 fL — ABNORMAL HIGH (ref 79–97)
Platelets: 153 10*3/uL (ref 150–379)
RBC: 3.18 x10E6/uL — ABNORMAL LOW (ref 4.14–5.80)
RDW: 15.1 % (ref 12.3–15.4)
WBC: 4.2 10*3/uL (ref 3.4–10.8)

## 2017-06-15 NOTE — Progress Notes (Signed)
Subjective:   Brendan [name] was seen in consultation in the movement disorder clinic at the request of Dr. [name].  His PCP is [name], MD.  The evaluation is for possible DBS for essential tremor. The records that were made available to me were reviewed. This patient is accompanied in the office by his spouse who supplements the history.  Tremor started approximately 25 years ago and involves the bilateral hands and head.   It started in the L hand but about 5 years ago he noted some tremor in the right hand.   Wife thinks that he has had head tremor for 25 years.  He is L hand dominant.   Tremor is most noticeable when he tries to eat.  Wife does note some rest tremor.   There is a family hx of tremor in paternal uncle, now deceased.    Affected by caffeine: doesn't know (doesn't drink caffeine) Affected by alcohol:  Rarely drinks so doesn't know Affected by stress:  Yes.  , per wife Affected by fatigue:  Yes.   Spills soup if on spoon:  Yes.   Spills glass of liquid if full:  Yes.   Affects ADL's (tying shoes, brushing teeth, etc):  Yes.   (does okay with teeth and shoes but trouble combing hair; shaves with electric)  Current/Previously tried tremor medications: propranolol (80 mg bid), primidone (250 mg bid), gabapentin (600 mg tid)  Current medications that may exacerbate tremor:  n/a  Outside reports reviewed: historical medical records, office notes and referral letter/letters. Reviewed patients most recent CT brain done in 2014 and there was atrophy but otherwise unremarkable.  Any other sx's: Voice: no change Sleep: trouble staying asleep (sleeps in recliner because the tremor wakes him up less)  Vivid Dreams:  No.  Acting out dreams:  No. Wet Pillows: No. Postural symptoms:  Yes.  , when first wakes up  Falls?  No. Bradykinesia symptoms: trouble getting out of chair or couch if low Loss of smell:  No. Loss of taste:  No. Urinary Incontinence:  No. Difficulty  Swallowing:  Yes.   Handwriting, micrographia: unknown - cannot write at all because of tremor Depression:  No. Memory changes:  No. Hallucinations:  No.  visual distortions: No. N/V:  No. Lightheaded:  No.  Syncope: No. Diplopia:  No. Dyskinesia:  No.  02/13/17 update: Patient presents today in follow-up for his essential tremor.  He is accompanied by his wife and daughter who supplements the history.  He had neurocognitive testing with Dr. [name] on October 26, 2016.  There is evidence of mild cognitive impairment but no evidence of dementia.  Saw Dr. [name] 01/31/17.    At that point in time, the patient was unsure if he wanted to proceed with DBS.  He was told by Dr. [name] that he would need cardiac clearance.  He did have an MRI of the brain, but the T2 images did not have proper spacing.    04/20/17 update: Patient is seen today for essential tremor for preop video and any last minute questions.  He is accompanied by his wife who supplements the history.  The patient is scheduled for fiducial placement on April 26, 2017.  He is scheduled for bilateral VIM DBS on May 04, 2017.  The patient is currently on primidone, 250 mg twice daily.  He is also on gabapentin, 600 mg 3 times per day.  06/18/17 update: Patient is seen today in follow-up for essential tremor.  He  is accompanied by his daughter who supplements the history.  Much has happened since our last visit.  Records are reviewed.  The patient was admitted to the hospital on May 04, 2017 for DBS surgery.  Postoperative images that same day indicated that the patient had pneumocephalus, but no blood was present at the time.  Postoperatively, the patient experienced confusion and had a CT of the brain demonstrating a small parenchymal hemorrhage along the left DBS lead.  The patient experienced right hemiparesis and confusion and a dysphasia.  He had another MRI of the brain on May 17, 2017 demonstrating a probable postoperative  hematoma.  The patient went to subacute nursing facility for rehab, but quickly bounced back to the hospital and subsequently ended up in inpatient rehab.  When he was admitted to inpatient rehab, strength was 0/5, he was lethargic and had significant difficulties with speech.  He was admitted to rehab from May 18, 2017 to June 09, 2017.  He made marked recovery and progress while in rehab.  When discharged, strength was 4/5 in the right upper and lower extremities and he was transferring with and without a rolling walker with minimal assist.  Overall, the patient and daughter are pleased with his progress.  He is doing very well at home and rarely uses the walker within the house.  But is not confused during the day.  He has some word finding troubles. daughter states that wakes up in the middle of the night confused.  On seroquel for sleep.  Having trouble reading - vision too blurry to read.    Allergies  Allergen Reactions  . Lisinopril Cough    Outpatient Encounter Medications as of 06/18/2017  Medication Sig  . aspirin 81 MG chewable tablet Chew 1 tablet (81 mg total) by mouth daily.  [name]. atorvastatin (LIPITOR) 40 MG tablet Take 1 tablet (40 mg total) by mouth daily.  . baclofen (LIORESAL) 10 MG tablet Take 1 tablet (10 mg total) by mouth at bedtime.  . Baclofen 5 MG TABS Take 5 mg by mouth 2 (two) times daily.  . bethanechol (URECHOLINE) 25 MG tablet Take 1 tablet (25 mg total) by mouth 3 (three) times daily.  [name]. docusate sodium (COLACE) 100 MG capsule Take 1 capsule (100 mg total) by mouth 2 (two) times daily.  . fish oil-omega-3 fatty acids 1000 MG capsule Take 1 g by mouth 2 (two) times daily.  [name]. gabapentin (NEURONTIN) 600 MG tablet Take 0.5 tablets (300 mg total) by mouth 3 (three) times daily.  [name]. glimepiride (AMARYL) 1 MG tablet Take 1 tablet (1 mg total) by mouth daily with breakfast.  . HYDROcodone-acetaminophen (NORCO/VICODIN) 5-325 MG tablet Take 1 tablet by mouth every 4 (four)  hours as needed for severe pain.  [name]. ketoconazole (NIZORAL) 2 % shampoo Apply 1 application topically 2 (two) times a week. (Patient taking differently: Apply 1 application topically once a week. )  . loratadine (CLARITIN) 10 MG tablet Take 1 tablet (10 mg total) by mouth daily.  [name]. losartan (COZAAR) 50 MG tablet Take 1 tablet (50 mg total) by mouth daily.  . Melatonin 3 MG TABS Take 1 tablet (3 mg total) by mouth at bedtime.  . nitroGLYCERIN (NITROSTAT) 0.4 MG SL tablet Place 1 tablet (0.4 mg total) under the tongue every 5 (five) minutes as needed for chest pain. call 911 if chest pain not better (Patient not taking: Reported on 06/12/2017)  . omeprazole (PRILOSEC) 20 MG capsule Take 1 capsule (20 mg  total) by mouth daily as needed (acid reflux).  . primidone (MYSOLINE) 50 MG tablet Take 1 tablet (50 mg total) by mouth 2 (two) times daily.  . propranolol (INDERAL) 40 MG tablet Take 1 tablet (40 mg total) by mouth 2 (two) times daily.  [name] Propylene Glycol (SYSTANE BALANCE) 0.6 % SOLN Place 1 drop into both eyes daily as needed (for dry eyes).  . QUEtiapine (SEROQUEL) 25 MG tablet Take 0.5 tablets (12.5 mg total) by mouth daily after supper.  . tamsulosin (FLOMAX) 0.4 MG CAPS capsule Take 2 capsules (0.8 mg total) by mouth daily after supper.   Facility-Administered Encounter Medications as of 06/18/2017  Medication  . 0.9 %  sodium chloride infusion  . sodium chloride 0.9 % injection 3 mL    Past Medical History:  Diagnosis Date  . Anxiety disorder   . Benign enlargement of prostate   . CAD in native artery 1997   Referred for CABG x3 in 2004 for LAD diagonal bifurcation lesion  . Cervical spinal stenosis   . Complex regional pain syndrome of right upper extremity    Right wrist; L arm  . Coronary atherosclerosis of artery bypass graft June 2013   Occluded SVG-D1; Cardiologist Dr. Herbie Baltimore  . Gait disorder   . Gastroesophageal reflux disease   . Glaucoma   . Hiatal hernia    GI: Dr Evette Cristal    . Hyperlipidemia LDL goal <70   . Hypertension   . Memory loss    Mild  . Obesity   . Peripheral neuropathy    possible peripheral neuropathy  . S/P CABG x 3 2004    LIMA-LAD, SVG to diagonal, SVG to OM  . Tremor, essential    On Primidone  . Unstable angina pectoris [facility])  June 2013   Cardiac cath: occluded SVG-DI. Patent LIMA-LAD and SVG-OM. EF 45% with apical inferior HK.    Past Surgical History:  Procedure Laterality Date  . CARDIAC CATHETERIZATION    . CATARACT EXTRACTION, BILATERAL    . CORONARY ARTERY BYPASS GRAFT  06/2002   LIMA-LAD, SVG-D1, SVG-OM  . EYE SURGERY    . KNEE SURGERY Left   . LEFT HEART CATHETERIZATION WITH CORONARY/GRAFT ANGIOGRAM N/A 10/13/2011   Procedure: LEFT HEART CATHETERIZATION WITH Isabel Caprice;  Surgeon: Marykay Lex, MD;  Location[facility] C[facility] LAB;  Service: Cardiovascular;  Laterality: N/A;  . MINOR PLACEMENT OF FIDUCIAL N/A 04/26/2017   Procedure: Fiducial placement;  Surgeon: Maeola Harman, MD;  Location: [facility] OR;  Service: Neurosurgery;  Laterality: N/A;  Fiducial placement  . NM MYOCAR PERF WALL MOTION  03/23/2009   protocol:Bruce, normal perfusion in all regions, post-stress EF 72%, exercise capacity , EKG negative for ischemia.  . Shoulder Orthoscopic Surgery   06/2003  . SUBTHALAMIC STIMULATOR INSERTION Bilateral 05/04/2017   Procedure: Bilateral Deep brain stimulator placement;  Surgeon: Maeola Harman, MD;  Location: [facility] OR;  Service: Neurosurgery;  Laterality: Bilateral;  Bilateral deep brain stimulator placement  . TRANSTHORACIC ECHOCARDIOGRAM  April 2014   EF 55-60%; no regional wall motion abnormalities. Relatively normal study normal diastolic parameters/abnormal relaxation  . wrist ganglion cyst Left     Social History   Socioeconomic History  . Marital status: Married    Spouse name: [name]  . Number of children: 2  . Years of education: 10  . Highest education level: Not on file  Social Needs  . Financial  resource strain: Not hard at all  . Food insecurity - worry: Never true  .  Food insecurity - inability: Never true  . Transportation needs - medical: No  . Transportation needs - non-medical: No  Occupational History  . Occupation: retired    Comment: Geneticist, molecular - facilities management x 30 years  Tobacco Use  . Smoking status: Former Smoker    Packs/day: 3.00    Years: 20.00    Pack years: 60.00    Types: Cigarettes    Last attempt to quit: 04/17/1968    Years since quitting: 49.2  . Smokeless tobacco: Never Used  Substance and Sexual Activity  . Alcohol use: No    Frequency: Never    Comment: Occasional 2 mixed drinks or 3 beers.  . Drug use: No  . Sexual activity: No  Other Topics Concern  . Not on file  Social History Narrative   Married.  Wife is Royal Piedra.   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   Former smoker, quit 20 years ago. Does not drink alcohol.   Does not get routine exercise, walks sometimes with a cane.   Caffeine use: diet-soda   Left handed     Family Status  Relation Name Status  . Mother  Deceased at age 4       CHF  . Father  Deceased at age 61       following a fall  . Sister  Alive  . Brother  Alive  . [employer]  Alive  . Brother  Alive       pt. has four brothers  . Sister  Alive  . Daughter  Alive  . Daughter  Alive  . Brother  Alive  . Brother  Alive    Review of Systems A complete 10 system ROS was obtained and was negative apart from what is mentioned.   Objective:   VITALS:   Vitals:   06/18/17 1446  BP: 132/66  Pulse: 62  SpO2: 92%  Weight: 200 lb (90.7 kg)  Height: 5\' 6"  (1.676 m)   Gen:  Appears stated age and in NAD. HEENT:  Normocephalic, atraumatic. The mucous membranes are moist. The superficial temporal arteries are without ropiness or tenderness.  He has tongue tremor.   Cardiovascular: Regular rate and rhythm. Lungs: Clear to auscultation bilaterally. Neck: There are no carotid bruits noted  bilaterally.  NEUROLOGICAL:  Orientation:  The patient is alert and oriented x 3.  Cranial nerves: There is good facial symmetry. Extraocular muscles are intact and VFF to confrontational testing.  However, when given a magazine with medium size print, he states that he cannot read that. Speech is fluent and clear but occasionally answers with the wrong word or wrong phrase.  He has mild vocal tremor.  Soft palate rises symmetrically and there is no tongue deviation. Hearing is intact to conversational tone. Tone: Tone is good throughout. Sensation: Sensation is intact to light touch throughout Coordination:  The patient has no dysdiadichokinesia or dysmetria. Motor: Strength is 5/5 in the bilateral upper and lower extremities with the exception of finger abduction on the right and strength there is 4+/5.  Shoulder shrug is equal bilaterally.  There is no pronator drift.  There are no fasciculations noted. Gait and Station: The patient ambulates in a wide-based fashion.  He has a mildly stooped posture.  He is mildly unstable.  He has less arm sing on the right  MOVEMENT EXAM: Tremor: There is bilateral upper extremity postural tremor, left much more severe than right (same as prior to sx)   labs:  Lab Results  Component Value Date   HGBA1C 5.8 (H) 05/16/2017     Chemistry      Component Value Date/Time   NA 141 06/14/2017 1433   K 5.2 06/14/2017 1433   CL 104 06/14/2017 1433   CO2 23 06/14/2017 1433   BUN 11 06/14/2017 1433   CREATININE 1.03 06/14/2017 1433   CREATININE 1.02 05/01/2016 1118      Component Value Date/Time   CALCIUM 9.1 06/14/2017 1433   ALKPHOS 88 05/21/2017 0431   AST 21 05/21/2017 0431   ALT 33 05/21/2017 0431   BILITOT 0.8 05/21/2017 0431       Assessment/Plan:   1.  Essential Tremor.  -Patient is status post bilateral DBS to the bilateral VIM on May 04, 2017.  He suffered a postoperative bleed on the left, with resultant hematoma causing initial  paralysis of the right hemi-soma and now mild paresis on the right.  He has not had his IPG placed because of these complications.  He has made a marked recovery and looks much better than in the past.  He saw primary care last Thursday, but notes are incomplete at this time.  -decrease baclofen from 5 mg bid and 10 at night to 5mg  in AM and keep 10 mg at night.  He has no spasticity, which was why he was started on it in the hospital.  His arm is moving well now.  I am leaving on the 10 at night, primarily because of sleep.  -Talked with Dr. [name] and updated him on the patient.  He would like to see the patient in April and likely will put his IPG in in May.  Talked to the patient about anesthesia and potential complications.  -continue primidone 50 mg bid.    -continue seroquel, 12.5 mg q hs for now.  Using mostly for sleep but awakens in the middle of the night confused.  Talked to him about making sure he stays active during the day.  2.  vision change  -will make appt with [facility] ophthalmolgy as don't think that this is primary brain etiology.  He has no visual field cut and has no issues with counting fingers.  3.  Hyperglycemia  -Was on insulin in the hospital and was discharged on Amaryl. would like his primary care physician to comment on this as his A1c was only 5.8 in the hospital.  Does not appear this was addressed at his primary care visit, but notes are incomplete.  4.  Follow up is anticipated in the next few months, sooner should new neurologic issues arise.  Will plan on programming battery in June   CC:  [name], MD

## 2017-06-18 ENCOUNTER — Encounter: Payer: Self-pay | Admitting: Neurology

## 2017-06-18 ENCOUNTER — Ambulatory Visit (INDEPENDENT_AMBULATORY_CARE_PROVIDER_SITE_OTHER): Payer: PPO | Admitting: Neurology

## 2017-06-18 VITALS — BP 132/66 | HR 62 | Ht 66.0 in | Wt 200.0 lb

## 2017-06-18 DIAGNOSIS — R739 Hyperglycemia, unspecified: Secondary | ICD-10-CM | POA: Diagnosis not present

## 2017-06-18 DIAGNOSIS — H539 Unspecified visual disturbance: Secondary | ICD-10-CM | POA: Diagnosis not present

## 2017-06-18 DIAGNOSIS — G250 Essential tremor: Secondary | ICD-10-CM | POA: Diagnosis not present

## 2017-06-18 NOTE — Patient Instructions (Addendum)
App[facility] with [facility] with Dr. Cathey EndowBowen 08/30/17 at 3:15 pm. They are located at [address] [place]. If this is not a good date/time please call [phone] to reschedule.

## 2017-06-19 ENCOUNTER — Other Ambulatory Visit: Payer: Self-pay | Admitting: *Deleted

## 2017-06-19 ENCOUNTER — Other Ambulatory Visit: Payer: Self-pay

## 2017-06-19 ENCOUNTER — Telehealth: Payer: Self-pay

## 2017-06-19 DIAGNOSIS — R338 Other retention of urine: Secondary | ICD-10-CM | POA: Diagnosis not present

## 2017-06-19 NOTE — Assessment & Plan Note (Addendum)
A1c borderline at 5.8 on 1/30. During hospitalization he was on insulin and discharged on Amaryl.  No symptoms of hypoglycemia.  Given his age and comorbidities, will hold off on tight control.  Discussed discontinuing for now with plan to recheck in 3 months - pt and family agreeable to this.    -f/u A1c 3 mo

## 2017-06-19 NOTE — Telephone Encounter (Signed)
Kathlene NovemberMike Mowers OT [facility] called requesting verbal orders for 1xwk X 1wk followed by 2xwk X 2wks.   Called him back and approved verbal orders.

## 2017-06-19 NOTE — Patient Outreach (Signed)
Transition of care call:  Placed call to patient and spoke with wife.  Mrs. [name] reports that patient is doing well. Reports he has had a lot of MD appointments. Reports home health is active now. Reports no new concerns today.   PLAN: reviewed with Mrs. [name] that I would call back in 1 week. Encouraged her to call me sooner if needed.  Rowe PavyAmanda Dequincy Born, RN, BSN, CEN [facility] Care Coordinator [phone]

## 2017-06-19 NOTE — Assessment & Plan Note (Signed)
Will obtain f/u CBC today to ensure Hgb stable

## 2017-06-19 NOTE — Patient Outreach (Signed)
[facility] Care Management  06/19/2017  Brendan [name] 03/12/1941 [id number]   CSW contacted patient at home and spoke with his wife and daughter.  They report he went to kidney doctor today and will f/u again in 3 months.  HH has been initiated and they are awaiting a call from one of the therapists, "Brendan [name]", for a follow up call regarding appointment.  Wife is a bit confused and overwhelmed by all the individuals and agency's working with him; "it's hard to keep up with whose who".  CSW encouraged [facility] to inquire about the therapy and all they have been expecting. CSW confirmed they have [facility].  Family reports he is doing good and no CSW needs identified.  CSW will update [facility] RNCM and plan to close CSW referral.   Reece LevyJanet Cheetara Hoge, MSW, LCSW Clinical Social Worker  [facility] [phone]

## 2017-06-19 NOTE — Assessment & Plan Note (Signed)
BMP today, AKI resolved to SCr 1.03

## 2017-06-19 NOTE — Assessment & Plan Note (Addendum)
S/p DBS surgery on last hospitalization with complication of postop hematoma.  Tremor remains present at this time. Following with Neurology and Dr. [name] as outpatient with plan to place IPG in May  -continue to monitor

## 2017-06-20 DIAGNOSIS — G8110 Spastic hemiplegia affecting unspecified side: Secondary | ICD-10-CM | POA: Diagnosis not present

## 2017-06-20 DIAGNOSIS — I619 Nontraumatic intracerebral hemorrhage, unspecified: Secondary | ICD-10-CM | POA: Diagnosis not present

## 2017-06-20 DIAGNOSIS — R339 Retention of urine, unspecified: Secondary | ICD-10-CM | POA: Diagnosis not present

## 2017-06-21 ENCOUNTER — Encounter: Payer: Self-pay | Admitting: Physical Medicine & Rehabilitation

## 2017-06-21 ENCOUNTER — Encounter: Payer: PPO | Admitting: Registered Nurse

## 2017-06-21 ENCOUNTER — Encounter: Payer: PPO | Attending: Registered Nurse | Admitting: Physical Medicine & Rehabilitation

## 2017-06-21 VITALS — BP 151/66 | HR 64

## 2017-06-21 DIAGNOSIS — R339 Retention of urine, unspecified: Secondary | ICD-10-CM

## 2017-06-21 DIAGNOSIS — N319 Neuromuscular dysfunction of bladder, unspecified: Secondary | ICD-10-CM | POA: Diagnosis not present

## 2017-06-21 DIAGNOSIS — R1310 Dysphagia, unspecified: Secondary | ICD-10-CM | POA: Insufficient documentation

## 2017-06-21 DIAGNOSIS — I619 Nontraumatic intracerebral hemorrhage, unspecified: Secondary | ICD-10-CM | POA: Diagnosis not present

## 2017-06-21 DIAGNOSIS — G479 Sleep disorder, unspecified: Secondary | ICD-10-CM

## 2017-06-21 DIAGNOSIS — R451 Restlessness and agitation: Secondary | ICD-10-CM | POA: Insufficient documentation

## 2017-06-21 DIAGNOSIS — R4701 Aphasia: Secondary | ICD-10-CM | POA: Diagnosis not present

## 2017-06-21 DIAGNOSIS — Z87891 Personal history of nicotine dependence: Secondary | ICD-10-CM | POA: Insufficient documentation

## 2017-06-21 DIAGNOSIS — I10 Essential (primary) hypertension: Secondary | ICD-10-CM

## 2017-06-21 DIAGNOSIS — F419 Anxiety disorder, unspecified: Secondary | ICD-10-CM | POA: Insufficient documentation

## 2017-06-21 DIAGNOSIS — Z9889 Other specified postprocedural states: Secondary | ICD-10-CM | POA: Insufficient documentation

## 2017-06-21 DIAGNOSIS — Z8249 Family history of ischemic heart disease and other diseases of the circulatory system: Secondary | ICD-10-CM | POA: Insufficient documentation

## 2017-06-21 DIAGNOSIS — G8110 Spastic hemiplegia affecting unspecified side: Secondary | ICD-10-CM

## 2017-06-21 DIAGNOSIS — F09 Unspecified mental disorder due to known physiological condition: Secondary | ICD-10-CM | POA: Diagnosis not present

## 2017-06-21 DIAGNOSIS — I25810 Atherosclerosis of coronary artery bypass graft(s) without angina pectoris: Secondary | ICD-10-CM | POA: Insufficient documentation

## 2017-06-21 DIAGNOSIS — G250 Essential tremor: Secondary | ICD-10-CM

## 2017-06-21 DIAGNOSIS — Z955 Presence of coronary angioplasty implant and graft: Secondary | ICD-10-CM | POA: Diagnosis not present

## 2017-06-21 NOTE — Progress Notes (Signed)
Subjective:    Patient ID: Brendan [name], male    DOB: 06/13/1940, 77 y.o.   MRN: [id number]  HPI 77 year old right-handed male with history of CAD and stenting as well as CABG, memory loss with bilateral deep brain stimulator electrode placement on May 04, 2017, per Dr. Venetia MaxonStern for tremors presents for transitional care management after receiving CIR for left frontal intraparenchymal hemorrhage.    DATE OF ADMISSION:  05/18/2017 DATE OF DISCHARGE:  06/09/2017  Present with family who provides history.  He has been doing well since discharge. At discharge, he was instructed to perform I/O caths, which he has ben doing.  He saw Urology and meds were adjusted.  He seen Neursurg next month.  He saw Neurology and plan is to proceed with 3rd part of Surgery. He sees Cards next month.  Saw PCP last week. Pain has been controlled. BP is slightly elevated.  Tremors are intermittent.  Denies swallowing difficulties eating regular foods. Sleep is intermittent.    Therapies: 2/week - had trouble communicating with them, but does not want to switch Mobility: Walker in community DME: Hospital bed, bedside commode  Pain Inventory Average Pain 0 Pain Right Now 0 My pain is na  In the last 24 hours, has pain interfered with the following? General activity 0 Relation with others 0 Enjoyment of life 0 What TIME of day is your pain at its worst? na Sleep (in general) Fair  Pain is worse with: na Pain improves with: na Relief from Meds: na  Mobility use a walker ability to climb steps?  yes do you drive?  no  Function retired  Neuro/Psych bladder control problems weakness tremor trouble walking dizziness confusion  Prior Studies Any changes since last visit?  no  Physicians involved in your care Any changes since last visit?  no   Family History  Problem Relation Age of Onset  . Heart disease Mother   . Heart disease Brother   . Tremor Paternal Uncle   . Heart disease  Brother   . Drug abuse Daughter   . Hepatitis C Daughter    Social History   Socioeconomic History  . Marital status: Married    Spouse name: [name]  . Number of children: 2  . Years of education: 10  . Highest education level: None  Social Needs  . Financial resource strain: Not hard at all  . Food insecurity - worry: Never true  . Food insecurity - inability: Never true  . Transportation needs - medical: No  . Transportation needs - non-medical: No  Occupational History  . Occupation: retired    Comment: Geneticist, [employer] - facilities management x 30 years  Tobacco Use  . Smoking status: Former Smoker    Packs/day: 3.00    Years: 20.00    Pack years: 60.00    Types: Cigarettes    Last attempt to quit: 04/17/1968    Years since quitting: 49.2  . Smokeless tobacco: Never Used  Substance and Sexual Activity  . Alcohol use: No    Frequency: Never  . Drug use: No  . Sexual activity: No  Other Topics Concern  . None  Social History Narrative   Married.  Wife is Royal PiedraMary Saran.   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   Former smoker, quit 20 years ago. Does not drink alcohol.   Does not get routine exercise, walks sometimes with a cane.   Caffeine use: diet-soda   Left handed    Past Surgical  History:  Procedure Laterality Date  . CARDIAC CATHETERIZATION    . CATARACT EXTRACTION, BILATERAL    . CORONARY ARTERY BYPASS GRAFT  06/2002   LIMA-LAD, SVG-D1, SVG-OM  . EYE SURGERY    . KNEE SURGERY Left   . LEFT HEART CATHETERIZATION WITH CORONARY/GRAFT ANGIOGRAM N/A 10/13/2011   Procedure: LEFT HEART CATHETERIZATION WITH Isabel Caprice;  Surgeon: Marykay Lex, MD;  Location: [facility] CATH LAB;  Service: Cardiovascular;  Laterality: N/A;  . MINOR PLACEMENT OF FIDUCIAL N/A 04/26/2017   Procedure: Fiducial placement;  Surgeon: Maeola Harman, MD;  Location: [facility] OR;  Service: Neurosurgery;  Laterality: N/A;  Fiducial placement  . NM MYOCAR PERF WALL MOTION  03/23/2009    protocol:Bruce, normal perfusion in all regions, post-stress EF 72%, exercise capacity , EKG negative for ischemia.  . Shoulder Orthoscopic Surgery   06/2003  . SUBTHALAMIC STIMULATOR INSERTION Bilateral 05/04/2017   Procedure: Bilateral Deep brain stimulator placement;  Surgeon: Maeola Harman, MD;  Location: [facility] OR;  Service: Neurosurgery;  Laterality: Bilateral;  Bilateral deep brain stimulator placement  . TRANSTHORACIC ECHOCARDIOGRAM  April 2014   EF 55-60%; no regional wall motion abnormalities. Relatively normal study normal diastolic parameters/abnormal relaxation  . wrist ganglion cyst Left    Past Medical History:  Diagnosis Date  . Anxiety disorder   . Benign enlargement of prostate   . CAD in native artery 1997   Referred for CABG x3 in 2004 for LAD diagonal bifurcation lesion  . Cervical spinal stenosis   . Complex regional pain syndrome of right upper extremity    Right wrist; L arm  . Coronary atherosclerosis of artery bypass graft June 2013   Occluded SVG-D1; Cardiologist Dr. Herbie Baltimore  . Gait disorder   . Gastroesophageal reflux disease   . Glaucoma   . Hiatal hernia    GI: Dr Evette Cristal  . Hyperlipidemia LDL goal <70   . Hypertension   . Memory loss    Mild  . Obesity   . Peripheral neuropathy    possible peripheral neuropathy  . S/P CABG x 3 2004    LIMA-LAD, SVG to diagonal, SVG to OM  . Tremor, essential    On Primidone  . Unstable angina pectoris [facility])  June 2013   Cardiac cath: occluded SVG-DI. Patent LIMA-LAD and SVG-OM. EF 45% with apical inferior HK.   BP (!) 151/66   Pulse 64   SpO2 95%   Opioid Risk Score:   Fall Risk Score:  `1  Depression screen PHQ 2/9  Depression screen [facility] 2/9 06/14/2017 06/12/2017 03/06/2017 11/28/2016 10/05/2016 08/24/2016 01/05/2016  Decreased Interest 0 0 0 0 0 0 0  Down, Depressed, Hopeless 0 0 0 0 0 0 0  PHQ - 2 Score 0 0 0 0 0 0 0  Some recent data might be hidden     Review of Systems  Constitutional: Positive for  diaphoresis.  HENT: Negative.   Eyes: Negative.   Respiratory: Negative.   Cardiovascular: Negative.   Gastrointestinal: Negative.   Endocrine: Negative.   Genitourinary: Negative.   Musculoskeletal: Positive for gait problem.  Skin: Negative.   Allergic/Immunologic: Negative.   Neurological: Positive for dizziness, tremors and weakness.  Hematological: Negative.   Psychiatric/Behavioral: Positive for confusion.  All other systems reviewed and are negative.      Objective:   Physical Exam General: NAD. Well-developed.  HEENT: Normocephalic, atraumatic. Cardio: RRR. No JVD. Resp: CTA Bilaterally. Normal  effort   GI: BS positive and ND Musc/Skel:  No  edema, no tenderness. Neuro: Alert and oriented Aphasia improving Motor:  RUE: 4+/5 proximal to distal RLE: 4+/5 proximal to distal LUE tremor Dysarthria Skin:   Intact. Warm and dry    Assessment & Plan:  77 year old right-handed male with history of CAD and stenting as well as CABG, memory loss with bilateral deep brain stimulator electrode placement on May 04, 2017, per Dr. [name] for tremors presents for transitional care management after receiving CIR for left frontal intraparenchymal hemorrhage.    1. Decreased functional mobility secondary to left frontal intraparenchymal hemorrhage with recent deep brain stimulator for tremor.    Cont therapies  Follow up with Neurology, Neurosurg  2. Pain Management:    Improving  Will d/c Neurontin 300 mg 3 times a day, Tylenol as needed  3. Hypertension.   Elevated today and trending up per family  Cont meds  Follow up with PCP  4. Tremors:  Cont meds  Follow up for 3rd part of surgery for DBS in ?May per Neurosurg/Neurology  5.  Dysphagia  Appears to have resolved  Cont to monitor for signs/symptoms of aspiration  6. Sleep disturbance with agitation  Encouraged trial of weaning Seroquel             Melatonin started on 2/11, increased on 2/14  7. Spastic  hemiplegia             Baclofen now being managed by Neurology  8.  Neurogenic bladder  Cont Flomax   Follow up with Urology  Cont I/O caths  9. Gait abnormality  Cont walker for safety  Meds reviewed Referrals reviewed All questions answered

## 2017-06-22 DIAGNOSIS — R41841 Cognitive communication deficit: Secondary | ICD-10-CM | POA: Diagnosis not present

## 2017-06-22 DIAGNOSIS — M48061 Spinal stenosis, lumbar region without neurogenic claudication: Secondary | ICD-10-CM | POA: Diagnosis not present

## 2017-06-22 DIAGNOSIS — F419 Anxiety disorder, unspecified: Secondary | ICD-10-CM | POA: Diagnosis not present

## 2017-06-22 DIAGNOSIS — E785 Hyperlipidemia, unspecified: Secondary | ICD-10-CM | POA: Diagnosis not present

## 2017-06-22 DIAGNOSIS — K219 Gastro-esophageal reflux disease without esophagitis: Secondary | ICD-10-CM | POA: Diagnosis not present

## 2017-06-22 DIAGNOSIS — R1310 Dysphagia, unspecified: Secondary | ICD-10-CM | POA: Diagnosis not present

## 2017-06-22 DIAGNOSIS — G8111 Spastic hemiplegia affecting right dominant side: Secondary | ICD-10-CM | POA: Diagnosis not present

## 2017-06-25 ENCOUNTER — Other Ambulatory Visit: Payer: Self-pay

## 2017-06-25 NOTE — Patient Outreach (Signed)
Transition of care:  Placed call to patient and spoke with wife, Corrie DandyMary.  Mrs. [name] reports patient is not feeling well. Developed UTI symptoms over the weekend. Wife called on call MD and Bactrim was called in. Wife reports some bloody urine on brief, fever and cloudy urine. Reports fever over the weekend which is now gone. Reports patient is drinking well.  Speech therapy out to see patient today.   PLAN: Will continue transition of care calls weekly. Reviewed with wife when to call MD.  Rowe PavyAmanda Ariana Juul, RN, BSN, CEN [facility] Coordinator [phone]

## 2017-06-27 DIAGNOSIS — N453 Epididymo-orchitis: Secondary | ICD-10-CM | POA: Diagnosis not present

## 2017-06-27 DIAGNOSIS — N3000 Acute cystitis without hematuria: Secondary | ICD-10-CM | POA: Diagnosis not present

## 2017-06-27 DIAGNOSIS — R338 Other retention of urine: Secondary | ICD-10-CM | POA: Diagnosis not present

## 2017-06-28 ENCOUNTER — Telehealth: Payer: Self-pay

## 2017-06-28 DIAGNOSIS — I110 Hypertensive heart disease with heart failure: Secondary | ICD-10-CM | POA: Diagnosis not present

## 2017-06-28 DIAGNOSIS — K219 Gastro-esophageal reflux disease without esophagitis: Secondary | ICD-10-CM | POA: Diagnosis not present

## 2017-06-28 DIAGNOSIS — G8111 Spastic hemiplegia affecting right dominant side: Secondary | ICD-10-CM | POA: Diagnosis not present

## 2017-06-28 DIAGNOSIS — L89320 Pressure ulcer of left buttock, unstageable: Secondary | ICD-10-CM | POA: Diagnosis not present

## 2017-06-28 DIAGNOSIS — I25110 Atherosclerotic heart disease of native coronary artery with unstable angina pectoris: Secondary | ICD-10-CM | POA: Diagnosis not present

## 2017-06-28 DIAGNOSIS — G250 Essential tremor: Secondary | ICD-10-CM | POA: Diagnosis not present

## 2017-06-28 DIAGNOSIS — R1310 Dysphagia, unspecified: Secondary | ICD-10-CM | POA: Diagnosis not present

## 2017-06-28 DIAGNOSIS — L89310 Pressure ulcer of right buttock, unstageable: Secondary | ICD-10-CM | POA: Diagnosis not present

## 2017-06-28 DIAGNOSIS — E785 Hyperlipidemia, unspecified: Secondary | ICD-10-CM | POA: Diagnosis not present

## 2017-06-28 DIAGNOSIS — R41841 Cognitive communication deficit: Secondary | ICD-10-CM | POA: Diagnosis not present

## 2017-06-28 DIAGNOSIS — F419 Anxiety disorder, unspecified: Secondary | ICD-10-CM | POA: Diagnosis not present

## 2017-06-28 DIAGNOSIS — T85190S Other mechanical complication of implanted electronic neurostimulator (electrode) of brain, sequela: Secondary | ICD-10-CM | POA: Diagnosis not present

## 2017-06-28 NOTE — Telephone Encounter (Signed)
Cari given verbal orders. Xyla Leisner Bruna PotterBlount, CMA

## 2017-06-28 NOTE — Telephone Encounter (Signed)
Cari- [facility] with [facility] calling requesting verbal orders to continue seeing patient for UTI and recent hydrocele. Cari call back [phone] Shawna OrleansMeredith B Tedd Cottrill, RN

## 2017-06-28 NOTE — Telephone Encounter (Signed)
Verbal orders given, please inform Cari, thanks

## 2017-06-29 ENCOUNTER — Encounter: Payer: Self-pay | Admitting: Cardiology

## 2017-06-29 ENCOUNTER — Telehealth: Payer: Self-pay | Admitting: Neurology

## 2017-06-29 ENCOUNTER — Ambulatory Visit: Payer: PPO | Admitting: Cardiology

## 2017-06-29 VITALS — BP 113/61 | HR 65 | Ht 66.0 in | Wt 200.0 lb

## 2017-06-29 DIAGNOSIS — I619 Nontraumatic intracerebral hemorrhage, unspecified: Secondary | ICD-10-CM | POA: Diagnosis not present

## 2017-06-29 DIAGNOSIS — I25119 Atherosclerotic heart disease of native coronary artery with unspecified angina pectoris: Secondary | ICD-10-CM

## 2017-06-29 DIAGNOSIS — I952 Hypotension due to drugs: Secondary | ICD-10-CM

## 2017-06-29 DIAGNOSIS — I208 Other forms of angina pectoris: Secondary | ICD-10-CM | POA: Diagnosis not present

## 2017-06-29 DIAGNOSIS — Z01810 Encounter for preprocedural cardiovascular examination: Secondary | ICD-10-CM | POA: Diagnosis not present

## 2017-06-29 DIAGNOSIS — I10 Essential (primary) hypertension: Secondary | ICD-10-CM

## 2017-06-29 DIAGNOSIS — G8110 Spastic hemiplegia affecting unspecified side: Secondary | ICD-10-CM | POA: Diagnosis not present

## 2017-06-29 DIAGNOSIS — R339 Retention of urine, unspecified: Secondary | ICD-10-CM | POA: Diagnosis not present

## 2017-06-29 MED ORDER — PROPRANOLOL HCL 40 MG PO TABS: 40.0000 mg | ORAL_TABLET | Freq: Two times a day (BID) | ORAL | 11 refills | Status: DC

## 2017-06-29 MED ORDER — LOSARTAN POTASSIUM 50 MG PO TABS: 50.0000 mg | ORAL_TABLET | Freq: Every day | ORAL | 11 refills | Status: DC

## 2017-06-29 MED ORDER — BACLOFEN 10 MG PO TABS: ORAL_TABLET | ORAL | 1 refills | Status: DC

## 2017-06-29 NOTE — Telephone Encounter (Signed)
Note was a little confusing.   It looks like he was on Baclofen 5 mg BID and 10 mg at night and you are changing to 5 mg in the day and 10 at night.   Is this correct?

## 2017-06-29 NOTE — Progress Notes (Signed)
PCP: Freddrick MarchAmin, [name], MD  NeuroSgx: Dr. Venetia MaxonStern Neurologist: Dr. Arbutus Leasat  Clinic Note: Chief Complaint  Patient presents with  . Hospitalization Follow-up    complicated DBS implant post-op  . Pre-op Exam    DBS _Battery Implant  . Coronary Artery Disease    No angina    HPI: Brendan [name] is a 77 y.o. male with a PMH below who presents today for hospital follow-up and preoperative evaluation  At the request of Dr. Venetia MaxonStern. Brendan [name] is a long-term patient of Dr. Julieanne MansonAlfred Little, who then began following up with me in 2013-2014 timeframe. He is a long-standing history of CAD s/p CABG, HLD, HTN, GERD  CAD -CABGx3 in 1997.   Unstable Angina in June 2013 - Occluded SVG-Diag w/Patent LIMA-LAD & SVG-OM treated medically. He does have chronic stable angina.   He is on Inderal as BB to help Rx tremors along with CAD.   Last Myoview 09/2015: EF 55-65%. No ischemia or infarction noted. LOW RISK   Brendan BoJerry W Bohan was last seen postoperatively as a follow-up preop consult May 04, 2017.  He had just completed stage I of bilateral deep brain stimulator, and stage II was going to require general anesthesia.  Because he had mentioned having chest discomfort and had to take nitroglycerin, we were asked to see him prior to the second stage.  As it turns out, he had not used nitroglycerin since June (he was not a very good historian postoperatively and I did not family members nearby).  Because of his significant essential tremor, he was not able to have a Myoview stress test done during this hospitalization, but went through the neck surgery without any difficulty from a cardiac standpoint.  Although not from a cardiac standpoint, his postop course was quite complicated.  Recent Hospitalizations:  January 10: Initial step for brain stimulator   January 18-25: Bilateral Deep Brain Stimulator Placement  He had confusion postop thought to be likely pneumocephalus however POD 1 and 2 he had a head CT  revealing pneumocephalus with small right intracerebral hemorrhage.  He was then unable to be [facility] because of insurance issues.  Expressive dysphasia continued.    January 29: Admitted for worsening right-sided weakness.  Interval evolution of of intraparenchymal hematoma with increased vasogenic edema.  No new bleed found.  [facility]  February 1-23, 2019: [facility] for prolonged rehab.   Studies Personally Reviewed - (if available, images/films reviewed: From Epic Chart or Care Everywhere)  Echo Jan 2019: Normal LV size and function.  EF 55-60%.  No or W MA.  Mild LA dilation.  Interval History: Brendan [name] returns today for follow-up from the hospitalization and for reassessment for preoperative clearance.  He is much more lucid today, but a lot of his history is still provided by his daughter.  As best I can elucidate from his family, he really has not used nitroglycerin since last June.  My concern during his inpatient consult was based on what seems like possibly chart lore & misinterpretation of his symptoms.  He has not had any anginal symptoms that he can recall since last June.  He is very limited as far as mobility, but during his rehab stay he had no chest tightness or pressure and probably did more walking and ambulation during that phase and he has in a long time. He still has the battery attachment portion of the Bilateral Deep Brain Stimulator Placement procedure to  go. He remains on Inderal for his tremor as it worked better than other beta blockers.  He is on moderate dose losartan which I think would be safe to hold perioperatively to avoid hypotension.  Because of the need for adequate cerebral perfusion, I would say hold losartan if his pressures are less than 110 mmHg. He is on aspirin and statin.  He has not been on Plavix or other antiplatelet agent.  He has mild intermittent edema, but no PND or orthopnea.  He does not really do a  lot of activity to determine exertional dyspnea, but he denies any resting symptoms.  No melena, hematochezia, hematuria or epistaxis.  No rapid irregular heartbeats or palpitations.  No syncope/near syncope.  Recovering neurologic function now left hand tremors a little bit in the right, but still present.  His daughter noted that his mental status is improved as his urinary tract infection has been treated.  No claudication.  ROS: A comprehensive was performed. Review of Systems  Constitutional: Positive for malaise/fatigue (Starting to get some energy back, but still lethargic after prolonged hospital stay l). Negative for chills and fever.  HENT: Negative for congestion and nosebleeds.   Respiratory: Negative for cough, shortness of breath and wheezing.   Cardiovascular: Positive for leg swelling (Relatively controlled).  Gastrointestinal: Negative for abdominal pain, constipation, heartburn and vomiting.  Genitourinary: Positive for dysuria (As a result of recent UTI.  Now cleared up).  Musculoskeletal: Positive for joint pain.  Neurological: Positive for dizziness, tremors, weakness (Generalized) and headaches.  Psychiatric/Behavioral: Positive for memory loss. The patient is not nervous/anxious and does not have insomnia.   All other systems reviewed and are negative.    I have reviewed and (if needed) personally updated the patient's problem list, medications, allergies, past medical and surgical history, social and family history.   Past Medical History:  Diagnosis Date  . Anxiety disorder   . Benign enlargement of prostate   . CAD in native artery 1997   Referred for CABG x3 in 2004 for LAD diagonal bifurcation lesion  . Cervical spinal stenosis   . Complex regional pain syndrome of right upper extremity    Right wrist; L arm  . Coronary atherosclerosis of artery bypass graft June 2013   Occluded SVG-D1; Cardiologist Dr. Herbie Baltimore  . Gait disorder   . Gastroesophageal  reflux disease   . Glaucoma   . Hiatal hernia    GI: Dr Evette Cristal  . Hyperlipidemia LDL goal <70   . Hypertension   . Memory loss    Mild  . Obesity   . Peripheral neuropathy    possible peripheral neuropathy  . S/P CABG x 3 2004    LIMA-LAD, SVG to diagonal, SVG to OM  . Tremor, essential    On Primidone  . Unstable angina pectoris [facility])  June 2013   Cardiac cath: occluded SVG-DI. Patent LIMA-LAD and SVG-OM. EF 45% with apical inferior HK.    Past Surgical History:  Procedure Laterality Date  . CARDIAC CATHETERIZATION    . CATARACT EXTRACTION, BILATERAL    . CORONARY ARTERY BYPASS GRAFT  06/2002   LIMA-LAD, SVG-D1, SVG-OM  . EYE SURGERY    . KNEE SURGERY Left   . LEFT HEART CATHETERIZATION WITH CORONARY/GRAFT ANGIOGRAM N/A 10/13/2011   Procedure: LEFT HEART CATHETERIZATION WITH Isabel Caprice;  Surgeon: Marykay Lex, MD;  Location: [facility] CATH LAB;  Service: Cardiovascular;  Laterality: N/A;  . MINOR PLACEMENT OF FIDUCIAL N/A 04/26/2017   Procedure: Fiducial  placement;  Surgeon: Maeola Harman, MD;  Location: [facility] OR;  Service: Neurosurgery;  Laterality: N/A;  Fiducial placement  . NM MYOCAR PERF WALL MOTION  03/23/2009   protocol:Bruce, normal perfusion in all regions, post-stress EF 72%, exercise capacity , EKG negative for ischemia.  . Shoulder Orthoscopic Surgery   06/2003  . SUBTHALAMIC STIMULATOR INSERTION Bilateral 05/04/2017   Procedure: Bilateral Deep brain stimulator placement;  Surgeon: Maeola Harman, MD;  Location: [facility] OR;  Service: Neurosurgery;  Laterality: Bilateral;  Bilateral deep brain stimulator placement  . TRANSTHORACIC ECHOCARDIOGRAM  04/2017   Jan 2019: Normal LV size and function.  EF 55-60%.  No or W MA.  Mild LA dilation.  . wrist ganglion cyst Left     Current Meds  Medication Sig  . aspirin 81 MG chewable tablet Chew 1 tablet (81 mg total) by mouth daily.  [name] atorvastatin (LIPITOR) 40 MG tablet Take 1 tablet (40 mg total) by mouth daily.  .  baclofen (LIORESAL) 10 MG tablet Take 10 mg by mouth every evening.  . Baclofen 5 MG TABS Take 1 tablet by mouth every morning.  . docusate sodium (COLACE) 100 MG capsule Take 1 capsule (100 mg total) by mouth 2 (two) times daily.  [name] gabapentin (NEURONTIN) 600 MG tablet Take 0.5 tablets (300 mg total) by mouth 3 (three) times daily.  [name] glimepiride (AMARYL) 1 MG tablet Take 1 tablet (1 mg total) by mouth daily with breakfast.  . HYDROcodone-acetaminophen (NORCO/VICODIN) 5-325 MG tablet Take 1 tablet by mouth every 4 (four) hours as needed for severe pain.  [name] ketoconazole (NIZORAL) 2 % shampoo Apply 1 application topically 2 (two) times a week. (Patient taking differently: Apply 1 application topically once a week. )  . loratadine (CLARITIN) 10 MG tablet Take 1 tablet (10 mg total) by mouth daily.  [name] losartan (COZAAR) 50 MG tablet Take 1 tablet (50 mg total) by mouth daily.  . Melatonin 3 MG TABS Take 1 tablet (3 mg total) by mouth at bedtime.  . nitroGLYCERIN (NITROSTAT) 0.4 MG SL tablet Place 1 tablet (0.4 mg total) under the tongue every 5 (five) minutes as needed for chest pain. call 911 if chest pain not better  . omeprazole (PRILOSEC) 20 MG capsule Take 1 capsule (20 mg total) by mouth daily as needed (acid reflux).  . primidone (MYSOLINE) 50 MG tablet Take 1 tablet (50 mg total) by mouth 2 (two) times daily.  . propranolol (INDERAL) 40 MG tablet Take 1 tablet (40 mg total) by mouth 2 (two) times daily.  [name] Propylene Glycol (SYSTANE BALANCE) 0.6 % SOLN Place 1 drop into both eyes daily as needed (for dry eyes).  . QUEtiapine (SEROQUEL) 25 MG tablet Take 0.5 tablets (12.5 mg total) by mouth daily after supper.  . tamsulosin (FLOMAX) 0.4 MG CAPS capsule Take 2 capsules (0.8 mg total) by mouth daily after supper.  . [DISCONTINUED] losartan (COZAAR) 50 MG tablet Take 1 tablet (50 mg total) by mouth daily.  . [DISCONTINUED] propranolol (INDERAL) 40 MG tablet Take 1 tablet (40 mg total) by mouth 2  (two) times daily.    Allergies  Allergen Reactions  . Lisinopril Cough    Social History   Tobacco Use  . Smoking status: Former Smoker    Packs/day: 3.00    Years: 20.00    Pack years: 60.00    Types: Cigarettes    Last attempt to quit: 04/17/1968    Years since quitting: 49.2  . Smokeless tobacco: Never Used  Substance Use Topics  . Alcohol use: No    Frequency: Never  . Drug use: No   Social History   Social History Narrative   Married.  Wife is Royal Piedra.   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   Former smoker, quit 20 years ago. Does not drink alcohol.   Does not get routine exercise, walks sometimes with a cane.   Caffeine use: diet-soda   Left handed     family history includes Drug abuse in his daughter; Heart disease in his brother, brother, and mother; Hepatitis C in his daughter; Tremor in his paternal uncle.  Wt Readings from Last 3 Encounters:  06/29/17 200 lb (90.7 kg)  06/18/17 200 lb (90.7 kg)  06/14/17 201 lb 9.6 oz (91.4 kg)    PHYSICAL EXAM BP 113/61   Pulse 65   Ht 5\' 6"  (1.676 m)   Wt 200 lb (90.7 kg)   BMI 32.28 kg/m  Physical Exam  Constitutional: He is oriented to person, place, and time. He appears well-developed and well-nourished. No distress.  Somewhat ill-appearing.  Fatigue looking.  No acute distress.  Well-groomed.  HENT:  Head: Atraumatic.  Mouth/Throat: No oropharyngeal exudate.  His hair is starting to grow back, but he has well healing suture lines on his head.  Eyes: EOM are normal.  Neck: No hepatojugular reflux and no JVD present. Carotid bruit is not present.  Cardiovascular: Normal rate, regular rhythm, S1 normal, S2 normal, normal heart sounds and intact distal pulses.  No extrasystoles are present. PMI is not displaced. Exam reveals no gallop and no friction rub.  No murmur heard. Pulmonary/Chest: Effort normal and breath sounds normal. No respiratory distress. He has no wheezes. He has no rales. He  exhibits no tenderness.  Abdominal: Soft. Bowel sounds are normal. He exhibits no distension. There is no tenderness.  Musculoskeletal: Normal range of motion. He exhibits edema (Trivial bilateral lower 70).  Neurological: He is alert and oriented to person, place, and time.  Skin: He is not diaphoretic.  Psychiatric: Thought content normal.  Somewhat slowed mentation.  Slower than usual.  Answer some questions, but usually defers to his wife or daughter to answer questions.     Adult ECG Report  Rate: 69;  Rhythm: normal sinus rhythm and Normal axis, intervals and durations.;   Narrative Interpretation: Normal EKG   Other studies Reviewed: Additional studies/ records that were reviewed today include:  Recent Labs:   Lab Results  Component Value Date   CREATININE 1.03 06/14/2017   BUN 11 06/14/2017   NA 141 06/14/2017   K 5.2 06/14/2017   CL 104 06/14/2017   CO2 23 06/14/2017   Lab Results  Component Value Date   CHOL 115 05/16/2017   HDL 29 (L) 05/16/2017   LDLCALC 73 05/16/2017   LDLDIRECT 84 08/30/2006   TRIG 64 05/16/2017   CHOLHDL 4.0 05/16/2017   Lab Results  Component Value Date   WBC 4.2 06/14/2017   HGB 10.6 (L) 06/14/2017   HCT 32.0 (L) 06/14/2017   MCV 101 (H) 06/14/2017   PLT 153 06/14/2017   Lab Results  Component Value Date   HGBA1C 5.8 (H) 05/16/2017    ASSESSMENT / PLAN: Problem List Items Addressed This Visit    Preoperative cardiovascular examination - Primary    English has known CAD, but with with no active anginal or heart failure symptoms.  Echocardiogram showed preserved EF. He has normal renal function.  Although he had neurologic findings  concerning for stroke, he was not thought to have had a stroke.  He does had diabetes, but does not take insulin. His plan surgery is ~Intermediate Risk from a cardiac standpoint as far as stress on the heart.  Based on ACC-AHA guidelines, in the absence of active cardiac symptoms of angina or heart  failure, I would recommend proceeding to the OR without further evaluation especially knowing that his echocardiogram is normal.    This is a procedure that needs to be done, and should not be delayed for further ischemic evaluation as all this would do his delay his procedure and potentially complicate issues if ischemic lesions were found.    With no active CAD symptoms, I would not otherwise consider Myoview ST -- findings would not change management.  Overwhelming data would suggest that he would do better going to his surgery and addressing cardiac issues if they come up afterwards. He tolerated relatively complicated hospitalization and procedures just recently without a cardiac complication.    PREOPERATIVE CARDIAC RISK ASSESSMENT   Revised Cardiac Risk Index:  High Risk Surgery: no; intracranial. Intermediate Risk  Defined as Intraperitoneal, intrathoracic or suprainguinal vascular  Active CAD: no; no recent NTG use (stable Sx)  CHF: no; Normal Echo  Cerebrovascular Disease: no; current neurologic symptoms are not related to ischemic stroke.  Diabetes: yes; On Insulin: no  CKD (Cr >~ 2): no; Cr 1.3  Total: 0 Estimated Risk of Adverse Outcome: With his underlying co-morbidities ~Low-Intermediate Risk, however reduced to LOW RISK with long-standing Beta Blocker Dose  Estimated Risk of MI, PE, VF/VT (Cardiac Arrest), Complete Heart Block: 1.3-3 %   Justification as follows:   ACC/AHA Guidelines for "Clearance":  Step 1 - Need for Emergency Surgery: No: but needs to complete BDBS procedure for Sx relief.  If Yes - go straight to OR with perioperative surveillance  Step 2 - Active Cardiac Conditions (Unstable Angina, Decompensated HF, Significant  Arrhytmias - Complete HB, Mobitz II, Symptomatic VT or SVT, Severe Aortic Stenosis - mean gradient > 40 mmHg, Valve area < 1.0 cm2):   No: Not active -- no NTG use since 09/2015  If Yes - Evaluate & Treat per ACC/AHA  Guidelines - Angina adequately treated (recent Myoview 09/2015 Non-ischemic   Step 3 -  Low Risk Surgery: Yes - from a Cardiac Standpoint (see above)  If Yes --> proceed to OR  If No --> Step 4  Step 4 - Functional Capacity >= 4 METS without symptoms: No: b/c tremor, etc.  If Yes --> proceed to OR  If No --> Step 5  Step 5 --  Clinical Risk Factors (CRF) - see above - Total 0.  3 or more: No: 0  If Yes -- assess Surgical Risk, --   (High Risk Non-cardiac), Intraabdominal or thoracic vascular surgery consider testing if it will change management.  Intermediate Risk: Proceed to OR with HR control, or consider testing if it will change management  1-2 or more CRFs: No: but does have co-morbidities  If Yes -- assess Surgical Risk, --   (High Risk Non-cardiac), Intraabdominal or thoracic vascular surgery --> Proceed to OR, or consider testing if it will change management.  Intermediate Risk: Proceed to OR with HR control, or consider testing if it will change management  No CRFs: Yes  If Yes --> Proceed to OR        Hypotension due to drugs    Blood pressure is little more stabilized today on current dose of  beta-blocker and losartan.  However I am leery of him having low blood pressures because of nerve issues.  If his pressures are less than 110, I recommended that they hold losartan.  Would simply hold it 2 days preop and restart once safe postop.      Relevant Medications   losartan (COZAAR) 50 MG tablet   propranolol (INDERAL) 40 MG tablet   Essential hypertension (Chronic)    Clearly not an issue on low-dose ARB and propranolol .  He is now only using Lasix as needed.  Is not currently listed on his med list.      Relevant Medications   losartan (COZAAR) 50 MG tablet   propranolol (INDERAL) 40 MG tablet   CHRONIC STABLE ANGINA (Chronic)    As noted elsewhere.  He has not had chest pain requiring nitroglycerin since June 2017.  No longer on long-acting nitrate.   Remains stable on ARB plus propranolol.      Relevant Medications   losartan (COZAAR) 50 MG tablet   propranolol (INDERAL) 40 MG tablet   Atherosclerotic heart disease of native coronary artery with angina pectoris (HCC) (Chronic)    He does have chronic stable angina with occluded SVG having diagonal.  Other grafts were patent.  His angina has been pretty well controlled with a combination of propranolol and previously nitrate, that is no longer present and he is not having active anginal symptoms at this point. He has not used nitroglycerin since June 2018.  His last Myoview was June 2017 and it was nonischemic.  In the absence of active anginal symptoms, I would not proceed with a Myoview at this time.  The initial consult  in which I recommended checking a Myoview was simply because he was in the hospital, and there was mention of him frequently using nitroglycerin.  However on my reassessment here today, that is not the case.  He has not been using nitroglycerin.  Plan: Continue current dose of beta-blocker aspirin and statin.  Aspirin may be stopped perioperatively. He is on losartan for afterload reduction.  I would recommend holding this preop day 1 and perioperatively until he proves to have stable blood pressure.  Would not want to avoid hypotension.  Otherwise I told him to hold it for systolic pressures less than 110.      Relevant Medications   losartan (COZAAR) 50 MG tablet   propranolol (INDERAL) 40 MG tablet      Current medicines are reviewed at length with the patient today. (+/- concerns) n/a The following changes have been made: see below   Patient Instructions  Medication Instructions: Your physician recommends that you continue on your current medications as directed. Please refer to the Current Medication list given to you today.  If you need a refill on your cardiac medications before your next appointment, please call your pharmacy.    Follow-Up: Your  physician wants you to follow-up in August with Dr. Herbie Baltimore.   Special Instructions:  You have been "Cleared" for surgery Please hold your Losartan 2 days prior to your procedure.    Studies Ordered:   No orders of the defined types were placed in this encounter.     Bryan Lemma, M.D., M.S. Interventional Cardiologist   Pager # [phone] Phone # [phone] [address] [place], [place] [address]   Thank you for choosing [facility] at [facility]!!

## 2017-06-29 NOTE — Telephone Encounter (Signed)
LMOM for patient's daughter to call back 

## 2017-06-29 NOTE — Telephone Encounter (Signed)
Spoke with patient's daughter. She states not having sleepiness. Would like to stay on 1/2 in the day and 1 at night. RX sent to pharmacy.

## 2017-06-29 NOTE — Patient Instructions (Addendum)
Medication Instructions: Your physician recommends that you continue on your current medications as directed. Please refer to the Current Medication list given to you today.  If you need a refill on your cardiac medications before your next appointment, please call your pharmacy.    Follow-Up: Your physician wants you to follow-up in August with Dr. Herbie BaltimoreHarding.   Special Instructions:  You have been "Cleared" for surgery Please hold your Losartan 2 days prior to your procedure.

## 2017-06-29 NOTE — Telephone Encounter (Signed)
Patient's daughter called regarding medication Baclofen and stopping it? Do you want him to stop the 1/2 tablet in the morning and then continue 10 mg in the evening? If so he will need refill. Please call. Thanks

## 2017-06-29 NOTE — Telephone Encounter (Signed)
I was.  If doing okay and they want to continue to reduce (esp if sleepy) we can go to 5 mg bid

## 2017-07-02 ENCOUNTER — Encounter: Payer: Self-pay | Admitting: Cardiology

## 2017-07-02 ENCOUNTER — Other Ambulatory Visit: Payer: Self-pay

## 2017-07-02 DIAGNOSIS — Z0000 Encounter for general adult medical examination without abnormal findings: Secondary | ICD-10-CM | POA: Insufficient documentation

## 2017-07-02 DIAGNOSIS — Z01810 Encounter for preprocedural cardiovascular examination: Secondary | ICD-10-CM | POA: Insufficient documentation

## 2017-07-02 NOTE — Assessment & Plan Note (Signed)
Brendan SorrowJerry has known CAD, but with with no active anginal or heart failure symptoms.  Echocardiogram showed preserved EF. He has normal renal function.  Although he had neurologic findings concerning for stroke, he was not thought to have had a stroke.  He does had diabetes, but does not take insulin. His plan surgery is ~Intermediate Risk from a cardiac standpoint as far as stress on the heart.  Based on ACC-AHA guidelines, in the absence of active cardiac symptoms of angina or heart failure, I would recommend proceeding to the OR without further evaluation especially knowing that his echocardiogram is normal.    This is a procedure that needs to be done, and should not be delayed for further ischemic evaluation as all this would do his delay his procedure and potentially complicate issues if ischemic lesions were found.    With no active CAD symptoms, I would not otherwise consider Myoview ST -- findings would not change management.  Overwhelming data would suggest that he would do better going to his surgery and addressing cardiac issues if they come up afterwards. He tolerated relatively complicated hospitalization and procedures just recently without a cardiac complication.    PREOPERATIVE CARDIAC RISK ASSESSMENT   Revised Cardiac Risk Index:  High Risk Surgery: no; intracranial. Intermediate Risk  Defined as Intraperitoneal, intrathoracic or suprainguinal vascular  Active CAD: no; no recent NTG use (stable Sx)  CHF: no; Normal Echo  Cerebrovascular Disease: no; current neurologic symptoms are not related to ischemic stroke.  Diabetes: yes; On Insulin: no  CKD (Cr >~ 2): no; Cr 1.3  Total: 0 Estimated Risk of Adverse Outcome: With his underlying co-morbidities ~Low-Intermediate Risk, however reduced to LOW RISK with long-standing Beta Blocker Dose  Estimated Risk of MI, PE, VF/VT (Cardiac Arrest), Complete Heart Block: 1.3-3 %   Justification as follows:   ACC/AHA Guidelines  for "Clearance":  Step 1 - Need for Emergency Surgery: No: but needs to complete BDBS procedure for Sx relief.  If Yes - go straight to OR with perioperative surveillance  Step 2 - Active Cardiac Conditions (Unstable Angina, Decompensated HF, Significant  Arrhytmias - Complete HB, Mobitz II, Symptomatic VT or SVT, Severe Aortic Stenosis - mean gradient > 40 mmHg, Valve area < 1.0 cm2):   No: Not active -- no NTG use since 09/2015  If Yes - Evaluate & Treat per ACC/AHA Guidelines - Angina adequately treated (recent Myoview 09/2015 Non-ischemic   Step 3 -  Low Risk Surgery: Yes - from a Cardiac Standpoint (see above)  If Yes --> proceed to OR  If No --> Step 4  Step 4 - Functional Capacity >= 4 METS without symptoms: No: b/c tremor, etc.  If Yes --> proceed to OR  If No --> Step 5  Step 5 --  Clinical Risk Factors (CRF) - see above - Total 0.  3 or more: No: 0  If Yes -- assess Surgical Risk, --   (High Risk Non-cardiac), Intraabdominal or thoracic vascular surgery consider testing if it will change management.  Intermediate Risk: Proceed to OR with HR control, or consider testing if it will change management  1-2 or more CRFs: No: but does have co-morbidities  If Yes -- assess Surgical Risk, --   (High Risk Non-cardiac), Intraabdominal or thoracic vascular surgery --> Proceed to OR, or consider testing if it will change management.  Intermediate Risk: Proceed to OR with HR control, or consider testing if it will change management  No CRFs: Yes  If Yes -->  Proceed to OR

## 2017-07-02 NOTE — Assessment & Plan Note (Signed)
As noted elsewhere.  He has not had chest pain requiring nitroglycerin since June 2017.  No longer on long-acting nitrate.  Remains stable on ARB plus propranolol.

## 2017-07-02 NOTE — Assessment & Plan Note (Signed)
Blood pressure is little more stabilized today on current dose of beta-blocker and losartan.  However I am leery of him having low blood pressures because of nerve issues.  If his pressures are less than 110, I recommended that they hold losartan.  Would simply hold it 2 days preop and restart once safe postop.

## 2017-07-02 NOTE — Assessment & Plan Note (Addendum)
Clearly not an issue on low-dose ARB and propranolol .  He is now only using Lasix as needed.  Is not currently listed on his med list.

## 2017-07-02 NOTE — Patient Outreach (Signed)
Transition of care:  Placed call to patient who is currently working with speech therapy.  Wife reports that patient is doing well.He was able to go to church with her yesterday. Reports that he continues to take his medications without problems. Reports that he will finish his antibiotics tomorrow.  Wife reports he no longer needs hospital bed and inquired about returning it. We discuss how to accomplish this task. Reviewed with wife about how she is doing with the in and out catheters . She reports okay. They just wish he could pee on his own.  Reports this is frustrating for him.   Wife reports surgeon is considering implanting the battery device in May.   PLAN: will contact patient again in 1 week. Encouraged patient to continue to do therapy daily. Reviewed with wife how to return bed.  Rowe PavyAmanda Jaeanna Mccomber, RN, BSN, CEN [facility] Care Coordinator [phone]

## 2017-07-02 NOTE — Assessment & Plan Note (Signed)
He does have chronic stable angina with occluded SVG having diagonal.  Other grafts were patent.  His angina has been pretty well controlled with a combination of propranolol and previously nitrate, that is no longer present and he is not having active anginal symptoms at this point. He has not used nitroglycerin since June 2018.  His last Myoview was June 2017 and it was nonischemic.  In the absence of active anginal symptoms, I would not proceed with a Myoview at this time.  The initial consult  in which I recommended checking a Myoview was simply because he was in the hospital, and there was mention of him frequently using nitroglycerin.  However on my reassessment here today, that is not the case.  He has not been using nitroglycerin.  Plan: Continue current dose of beta-blocker aspirin and statin.  Aspirin may be stopped perioperatively. He is on losartan for afterload reduction.  I would recommend holding this preop day 1 and perioperatively until he proves to have stable blood pressure.  Would not want to avoid hypotension.  Otherwise I told him to hold it for systolic pressures less than 110.

## 2017-07-05 DIAGNOSIS — G8110 Spastic hemiplegia affecting unspecified side: Secondary | ICD-10-CM | POA: Diagnosis not present

## 2017-07-05 DIAGNOSIS — I619 Nontraumatic intracerebral hemorrhage, unspecified: Secondary | ICD-10-CM | POA: Diagnosis not present

## 2017-07-09 ENCOUNTER — Other Ambulatory Visit: Payer: Self-pay

## 2017-07-10 NOTE — Patient Outreach (Signed)
Transition of care: Placed call to patient and spoke with wife. Mrs. Brendan [name] reports that patient is not himself today. Reports feels weak.  Reports therapist was out to see patient today. Wife reports patient will see urologist on Wednesday 07/11/2017.  Wife reports daughter will drive patient to appointment.   Reviewed reasons why wife feels patient is not himself. She reports that he is sluggish. Reports urine is darker. Denies any fever.   PLAN: reviewed signs of UTI and encourage wife to MD for any symptoms.  Encouraged wife to have sure patient is eating and drinking well. Confirmed with wife hospital bed was returned.  Reviewed with wife I would be back in touch in 1 month. Encouraged wife to call me sooner if needed.  Patient has successfully complete 30 day transition of care program with no readmissions.  Rowe PavyAmanda Cook, RN, BSN, CEN [facility] Care Coordinator [phone]

## 2017-07-11 DIAGNOSIS — N453 Epididymo-orchitis: Secondary | ICD-10-CM | POA: Diagnosis not present

## 2017-07-11 DIAGNOSIS — R338 Other retention of urine: Secondary | ICD-10-CM | POA: Diagnosis not present

## 2017-07-12 DIAGNOSIS — R339 Retention of urine, unspecified: Secondary | ICD-10-CM | POA: Diagnosis not present

## 2017-07-12 DIAGNOSIS — G8110 Spastic hemiplegia affecting unspecified side: Secondary | ICD-10-CM | POA: Diagnosis not present

## 2017-07-12 DIAGNOSIS — I619 Nontraumatic intracerebral hemorrhage, unspecified: Secondary | ICD-10-CM | POA: Diagnosis not present

## 2017-07-16 ENCOUNTER — Other Ambulatory Visit: Payer: Self-pay

## 2017-07-16 ENCOUNTER — Encounter (HOSPITAL_COMMUNITY): Payer: Self-pay

## 2017-07-16 ENCOUNTER — Emergency Department (HOSPITAL_COMMUNITY): Payer: PPO

## 2017-07-16 ENCOUNTER — Telehealth: Payer: Self-pay | Admitting: *Deleted

## 2017-07-16 ENCOUNTER — Inpatient Hospital Stay (HOSPITAL_COMMUNITY)
Admission: EM | Admit: 2017-07-16 | Discharge: 2017-07-19 | DRG: 872 | Disposition: A | Discharge status: Home / Self Care | Payer: PPO | Attending: Family Medicine | Admitting: Family Medicine

## 2017-07-16 DIAGNOSIS — G250 Essential tremor: Secondary | ICD-10-CM | POA: Diagnosis not present

## 2017-07-16 DIAGNOSIS — I2510 Atherosclerotic heart disease of native coronary artery without angina pectoris: Secondary | ICD-10-CM | POA: Diagnosis not present

## 2017-07-16 DIAGNOSIS — R338 Other retention of urine: Secondary | ICD-10-CM | POA: Diagnosis not present

## 2017-07-16 DIAGNOSIS — I10 Essential (primary) hypertension: Secondary | ICD-10-CM | POA: Diagnosis present

## 2017-07-16 DIAGNOSIS — K449 Diaphragmatic hernia without obstruction or gangrene: Secondary | ICD-10-CM | POA: Diagnosis not present

## 2017-07-16 DIAGNOSIS — R310 Gross hematuria: Secondary | ICD-10-CM | POA: Diagnosis not present

## 2017-07-16 DIAGNOSIS — G9050 Complex regional pain syndrome I, unspecified: Secondary | ICD-10-CM | POA: Diagnosis not present

## 2017-07-16 DIAGNOSIS — D696 Thrombocytopenia, unspecified: Secondary | ICD-10-CM | POA: Diagnosis not present

## 2017-07-16 DIAGNOSIS — N432 Other hydrocele: Secondary | ICD-10-CM | POA: Diagnosis not present

## 2017-07-16 DIAGNOSIS — J309 Allergic rhinitis, unspecified: Secondary | ICD-10-CM | POA: Diagnosis not present

## 2017-07-16 DIAGNOSIS — A419 Sepsis, unspecified organism: Principal | ICD-10-CM | POA: Diagnosis present

## 2017-07-16 DIAGNOSIS — N401 Enlarged prostate with lower urinary tract symptoms: Secondary | ICD-10-CM | POA: Diagnosis not present

## 2017-07-16 DIAGNOSIS — Z8249 Family history of ischemic heart disease and other diseases of the circulatory system: Secondary | ICD-10-CM

## 2017-07-16 DIAGNOSIS — N5082 Scrotal pain: Secondary | ICD-10-CM | POA: Diagnosis present

## 2017-07-16 DIAGNOSIS — B965 Pseudomonas (aeruginosa) (mallei) (pseudomallei) as the cause of diseases classified elsewhere: Secondary | ICD-10-CM | POA: Diagnosis not present

## 2017-07-16 DIAGNOSIS — R062 Wheezing: Secondary | ICD-10-CM | POA: Diagnosis not present

## 2017-07-16 DIAGNOSIS — I25119 Atherosclerotic heart disease of native coronary artery with unspecified angina pectoris: Secondary | ICD-10-CM

## 2017-07-16 DIAGNOSIS — R509 Fever, unspecified: Secondary | ICD-10-CM | POA: Diagnosis not present

## 2017-07-16 DIAGNOSIS — N5089 Other specified disorders of the male genital organs: Secondary | ICD-10-CM

## 2017-07-16 DIAGNOSIS — Z888 Allergy status to other drugs, medicaments and biological substances status: Secondary | ICD-10-CM

## 2017-07-16 DIAGNOSIS — Z79899 Other long term (current) drug therapy: Secondary | ICD-10-CM

## 2017-07-16 DIAGNOSIS — N451 Epididymitis: Secondary | ICD-10-CM

## 2017-07-16 DIAGNOSIS — Z9689 Presence of other specified functional implants: Secondary | ICD-10-CM | POA: Diagnosis not present

## 2017-07-16 DIAGNOSIS — N400 Enlarged prostate without lower urinary tract symptoms: Secondary | ICD-10-CM | POA: Diagnosis present

## 2017-07-16 DIAGNOSIS — H409 Unspecified glaucoma: Secondary | ICD-10-CM | POA: Diagnosis present

## 2017-07-16 DIAGNOSIS — Z7982 Long term (current) use of aspirin: Secondary | ICD-10-CM

## 2017-07-16 DIAGNOSIS — R600 Localized edema: Secondary | ICD-10-CM

## 2017-07-16 DIAGNOSIS — G479 Sleep disorder, unspecified: Secondary | ICD-10-CM | POA: Diagnosis not present

## 2017-07-16 DIAGNOSIS — N319 Neuromuscular dysfunction of bladder, unspecified: Secondary | ICD-10-CM | POA: Diagnosis present

## 2017-07-16 DIAGNOSIS — N433 Hydrocele, unspecified: Secondary | ICD-10-CM | POA: Diagnosis present

## 2017-07-16 DIAGNOSIS — N453 Epididymo-orchitis: Secondary | ICD-10-CM | POA: Diagnosis not present

## 2017-07-16 DIAGNOSIS — K219 Gastro-esophageal reflux disease without esophagitis: Secondary | ICD-10-CM | POA: Diagnosis present

## 2017-07-16 DIAGNOSIS — F411 Generalized anxiety disorder: Secondary | ICD-10-CM | POA: Diagnosis not present

## 2017-07-16 DIAGNOSIS — Z87891 Personal history of nicotine dependence: Secondary | ICD-10-CM

## 2017-07-16 DIAGNOSIS — J984 Other disorders of lung: Secondary | ICD-10-CM | POA: Diagnosis not present

## 2017-07-16 DIAGNOSIS — E669 Obesity, unspecified: Secondary | ICD-10-CM | POA: Diagnosis present

## 2017-07-16 DIAGNOSIS — J61 Pneumoconiosis due to asbestos and other mineral fibers: Secondary | ICD-10-CM

## 2017-07-16 DIAGNOSIS — E785 Hyperlipidemia, unspecified: Secondary | ICD-10-CM | POA: Diagnosis present

## 2017-07-16 DIAGNOSIS — D649 Anemia, unspecified: Secondary | ICD-10-CM | POA: Diagnosis present

## 2017-07-16 DIAGNOSIS — Z6832 Body mass index (BMI) 32.0-32.9, adult: Secondary | ICD-10-CM

## 2017-07-16 DIAGNOSIS — I69151 Hemiplegia and hemiparesis following nontraumatic intracerebral hemorrhage affecting right dominant side: Secondary | ICD-10-CM

## 2017-07-16 DIAGNOSIS — R339 Retention of urine, unspecified: Secondary | ICD-10-CM | POA: Diagnosis present

## 2017-07-16 DIAGNOSIS — B952 Enterococcus as the cause of diseases classified elsewhere: Secondary | ICD-10-CM

## 2017-07-16 DIAGNOSIS — Z951 Presence of aortocoronary bypass graft: Secondary | ICD-10-CM

## 2017-07-16 DIAGNOSIS — N390 Urinary tract infection, site not specified: Secondary | ICD-10-CM

## 2017-07-16 DIAGNOSIS — J929 Pleural plaque without asbestos: Secondary | ICD-10-CM | POA: Diagnosis not present

## 2017-07-16 LAB — I-STAT CG4 LACTIC ACID, ED
Lactic Acid, Venous: 1.58 mmol/L (ref 0.5–1.9)
Lactic Acid, Venous: 1.29 mmol/L (ref 0.5–1.9)

## 2017-07-16 LAB — URINALYSIS, ROUTINE W REFLEX MICROSCOPIC
Bilirubin Urine: NEGATIVE
Glucose, UA: NEGATIVE mg/dL
Ketones, ur: NEGATIVE mg/dL
Leukocytes, UA: NEGATIVE
Nitrite: NEGATIVE
Protein, ur: 30 mg/dL — AB
Specific Gravity, Urine: 1.013 (ref 1.005–1.030)
Squamous Epithelial / LPF: NONE SEEN
pH: 5 (ref 5.0–8.0)

## 2017-07-16 LAB — COMPREHENSIVE METABOLIC PANEL
ALT: 17 U/L (ref 17–63)
AST: 17 U/L (ref 15–41)
Albumin: 3.8 g/dL (ref 3.5–5.0)
Alkaline Phosphatase: 82 U/L (ref 38–126)
Anion gap: 10 (ref 5–15)
BUN: 21 mg/dL — ABNORMAL HIGH (ref 6–20)
CO2: 25 mmol/L (ref 22–32)
Calcium: 9 mg/dL (ref 8.9–10.3)
Chloride: 102 mmol/L (ref 101–111)
Creatinine, Ser: 1.2 mg/dL (ref 0.61–1.24)
GFR calc Af Amer: >60 mL/min (ref >60)
GFR calc non Af Amer: 58 mL/min — ABNORMAL LOW (ref >60)
Glucose, Bld: 183 mg/dL — ABNORMAL HIGH (ref 65–99)
Potassium: 5.1 mmol/L (ref 3.5–5.1)
Sodium: 137 mmol/L (ref 135–145)
Total Bilirubin: 1.2 mg/dL (ref 0.3–1.2)
Total Protein: 7.6 g/dL (ref 6.5–8.1)

## 2017-07-16 LAB — CBC WITH DIFFERENTIAL/PLATELET
Basophils Absolute: 0 10*3/uL (ref 0.0–0.1)
Basophils Relative: 0 %
Eosinophils Absolute: 0 10*3/uL (ref 0.0–0.7)
Eosinophils Relative: 0 %
HCT: 31.2 % — ABNORMAL LOW (ref 39.0–52.0)
Hemoglobin: 10.3 g/dL — ABNORMAL LOW (ref 13.0–17.0)
Lymphocytes Relative: 6 %
Lymphs Abs: 0.6 10*3/uL — ABNORMAL LOW (ref 0.7–4.0)
MCH: 32.8 pg (ref 26.0–34.0)
MCHC: 33 g/dL (ref 30.0–36.0)
MCV: 99.4 fL (ref 78.0–100.0)
Monocytes Absolute: 0.6 10*3/uL (ref 0.1–1.0)
Monocytes Relative: 6 %
Neutro Abs: 8.7 10*3/uL — ABNORMAL HIGH (ref 1.7–7.7)
Neutrophils Relative %: 88 %
Platelets: 156 10*3/uL (ref 150–400)
RBC: 3.14 MIL/uL — ABNORMAL LOW (ref 4.22–5.81)
RDW: 13.8 % (ref 11.5–15.5)
WBC: 9.9 10*3/uL (ref 4.0–10.5)

## 2017-07-16 LAB — PROTIME-INR
INR: 1.27
Prothrombin Time: 15.8 seconds — ABNORMAL HIGH (ref 11.4–15.2)

## 2017-07-16 MED ORDER — SODIUM CHLORIDE 0.9 % IV SOLN: 1.0000 g | Freq: Once | INTRAVENOUS | Status: DC

## 2017-07-16 MED ORDER — SODIUM CHLORIDE 0.9 % IV BOLUS
500.0000 mL | Freq: Once | INTRAVENOUS | Status: AC
  Administered 2017-07-16: 500 mL via INTRAVENOUS

## 2017-07-16 MED ORDER — SODIUM CHLORIDE 0.9 % IV SOLN
2.0000 g | Freq: Once | INTRAVENOUS | Status: AC
  Administered 2017-07-16: 2 g via INTRAVENOUS
  Filled 2017-07-16: qty 20

## 2017-07-16 MED ORDER — ORAL CARE MOUTH RINSE
15.0000 mL | Freq: Two times a day (BID) | OROMUCOSAL | Status: DC
  Administered 2017-07-17 – 2017-07-19 (×4): 15 mL via OROMUCOSAL

## 2017-07-16 MED ORDER — ACETAMINOPHEN 325 MG PO TABS
650.0000 mg | ORAL_TABLET | Freq: Once | ORAL | Status: AC | PRN
  Administered 2017-07-16: 650 mg via ORAL
  Filled 2017-07-16: qty 2

## 2017-07-16 NOTE — Telephone Encounter (Signed)
Raynelle FanningJulie ST [facility] get [facility] for 1wk3 to work on cognition.  Approval given.

## 2017-07-16 NOTE — ED Triage Notes (Signed)
[facility] was [facility] for fever and swelling of the scrotum. Patient is lethargic, has hematuria, and family unable to get fever below 101.7  Since yesterday. Patient normally walks, but is non ambulatory and can not slide himself up in the wheelchair.

## 2017-07-16 NOTE — Progress Notes (Signed)
[facility]

## 2017-07-16 NOTE — H&P (Addendum)
Family Medicine Teaching [facility] Admission History and Physical Service Pager: [phone]  Patient name: Brendan [name] Medical record number: [id number] Date of birth: Jun 14, 1940 Age: 77 y.o. Gender: male  Primary Care Provider: [name], MD Consultants: Urology Code Status: Full  Chief Complaint: scrotal pain, fever  Assessment and Plan: Brendan [name] is a 77 y.o. male presenting with fever in addition to scrotal pain and swelling. PMH is significant for HTN, h/o CAD with CABG x3, HLD, essential tremor s/p deep brain stimulator placement, GERD, BPH, anxiety.   Scrotal swelling Patient referred to ED from urology office for evaluation due to increasing pain and swelling.  On arrival to [facility] ED, patient meeting sepsis criteria with tachycardia to low 100s, tachypnea and fever to 104.19F.  Blood and urine cultures obtained and sepsis protocol initiated. Patient was given IV NS 500 mL bolus  x2 in addition to Tylenol and 2g ceftriaxone x1 dose.  He is without oxygen requirement at home, however found to desat to 87% on RA and was placed on 2 L per [place] with improvement in respiratory status. Labs largely unremarkable and without elevated white count.  Prothrombin time elevated to 15.8 and INR of 1.27.  Lactic acid is 1.58 and within normal range.  EKG with sinus tachycardia and CXR negative. UA with moderate hemoglobin, rare bacteria and 30 protein, negative for nitrite and leukocytes.  Likely due to developing epididymal orchitis.  Physical exam without evidence of surrounding involvement or cellulitis.  Scrotal swelling is more extensive on the L>R region.  Scrotal ultrasound obtained in ED which revealed significant soft tissue edema, however normal sonographic appearance of bilateral testes without evidence of drainable fluid collection or abscess.  Small to moderate bilateral hydroceles found.  He was transferred to [facility] and urology consulted by ED provider.    -Admit to telemetry,  attending Dr. McDiarmid - F/u urology consulted, appreciate recs -Continuous cardiac monitoring -s/p Rocephin x1 in ED; discussed with pharmacy and will opt to not continue in setting of negative UA and no urinary symptoms -Continue IV Levaquin 500 mg Q24h x 10 days for treatment of epididymal orchitis; transition to oral when able -Will add vancomycin for MRSA coverage -IV NS @75  cc.hr x 12h -Trend lactic acid -Follow blood and urine cx -Supplemental O2 per [place]; wean to RA when able -I/O -Tylenol 650 mg Q6 PRN -Tramadol 50 mg Q6 PRN if pain not controlled with Tylenol -Vitals per unit routine - Pulse ox with vitals  HTN On admission with BP 150/74.  At home on losartan 50 mg with good compliance.  Blood pressures further elevated in ED to systolic 180's but now appears to be controlled. - Continue home losartan  H/o CAD with CABG x3.  Patient denies any chest pain or current symptoms.  At home on daily aspirin 81 mg and Lipitor 40 mg. -Continue daily ASA and Lipitor  HLD.  At home on Lipitor 40 mg daily and well controlled.  Recent lipid panel on 04/2017 with LDL 73, HDL 29, TG 64. -Continue home Lipitor  Essential tremor.  Patient s/p deep brain stimulator placement January 2019, however with prolonged hospital course secondary to postop hematoma requiring comprehensive rehab program.   Follows with Dr. [name], neurosurgery, as outpatient and has an appointment scheduled for this Wednesday.  Per chart review, plan is to place IPG in May.  Patient takes propranolol 40 mg BID and primidone 50 mg BID at home.   -Continue home propranolol and primidone -  Discussed with daughter may need to change appointment with neurosurgery if he is hospitalized at that time and still requiring IV antibiotics  GERD.  Stable.  At home on Prilosec 20 mg daily. - Will replace with Protonix 40 mg daily while inpatient  BPH.  Follows as outpatient with [facility].  At home on Flomax.  Patient has been  in and out cathing at home and notes he sometimes notices blood in his urine.  He feels this may likely be secondary to trauma from cath. -Continue home Flomax -Foley in place -Monitor I's and O's -Urology recs  Sleep disturbance.  Stable.  At home on melatonin 3 mg tablets. - Continue home melatonin  Anxiety.  Stable, controlled.  At home on Seroquel 12.5 mg daily.  Patient denies mood disturbances or new symptoms. - Continue home Seroquel  Chronic pain.  Stable.  Patient with history of cervical spinal stenosis and CRPS of right upper extremity.  At home on gabapentin 300 mg TID, and baclofen 10 mg daily. - Continue home gabapentin and baclofen   FEN/GI: Heart healthy diet, IV NS @75  cc.hr x 12 hours  Prophylaxis: Lovenox  Disposition: Admit to telemetry, attending Dr. McDiarmid  History of Present Illness:  Brendan [name] is a 77 y.o. male presenting with fever and left-sided scrotal swelling.   Seen on Wednesday by [facility] for follow-up of scrotal swelling x2-3 weeks.  He was told that it appears to have improved and was started on doxycycline to cover for possible UTI.  He then had a fever over a few days to Tmax 104F;  wife gave Tylenol and called the urology office to inform them. Today when she called, he was instructed to come in to the office where he was given a dose of Levaquin and referred to the ED.  Pt presented to [facility] ED this evening.  Daughter states today pt had been acting unlike himself and more sleepier than usual.  Pain initially localized to right scrotum and then spread to left region.  Endorsing chills and some confusion and weakness.  No cough, congestion.  No CP or SOB.  Denies dysuria, frequency and urgency.  He has noticed some blood in his urine every now and then. He feels the blood in urine may be due to in and out cathing at home.  Endorses some diarrhea likely from recent antibiotics.  Took his morning medications.  Review Of Systems: Per HPI with the  following additions:   Review of Systems  Constitutional: Positive for chills and fever.  HENT: Negative for congestion.   Respiratory: Negative for cough, shortness of breath and wheezing.   Cardiovascular: Negative for chest pain and leg swelling.  Gastrointestinal: Negative for abdominal pain, nausea and vomiting.  Genitourinary: Positive for hematuria. Negative for dysuria, frequency and urgency.  Skin: Negative for rash.  Neurological: Positive for tremors and weakness.   Patient Active Problem List   Diagnosis Date Noted  . Sepsis (HCC) 07/16/2017  . Preoperative cardiovascular examination 07/02/2017  . Labile blood pressure   . Spastic hemiparesis affecting dominant side (HCC)   . Thrombocytopenia (HCC)   . Agitation   . Labile blood glucose   . Neurogenic bladder   . AKI (acute kidney injury) (HCC)   . Dizziness   . Hemiplegia (HCC)   . Sleep disturbance   . Confusion, postoperative   . Hypotension due to drugs   . Acute blood loss anemia   . Prediabetes   . Dysphagia   .  Intraparenchymal hemorrhage of brain (HCC) 05/18/2017  . Tremors of nervous system 05/18/2017  . Right sided weakness   . Aphasia   . Intracerebral hemorrhage 05/16/2017  . Cerebral edema (HCC)   . Weakness 05/15/2017  . Glaucoma   . Anxiety state   . Cognitive disorder   . Urinary retention   . Traumatic hemorrhage of left cerebrum without loss of consciousness (HCC)   . Pressure injury of skin 05/05/2017  . Seborrheic dermatitis of scalp 10/05/2016  . Tremor, essential 10/03/2016  . Cough 06/27/2016  . Rhinitis, allergic 01/12/2014  . CHRONIC STABLE ANGINA 08/06/2013  . Obesity (BMI 30-39.9) 01/30/2013  . Lower extremity edema 01/30/2013  . S/P CABG x 3   . Atherosclerotic heart disease of native coronary artery with angina pectoris (HCC)   . Hyperlipidemia   . Memory problem 07/01/2012  . RSD (reflex sympathetic dystrophy) 05/24/2012  . Pleural plaque with presence of asbestos  05/15/2012  . Anemia, B12 deficiency 03/19/2012  . Atherosclerotic heart disease of artery bypass graft 09/16/2011  . BPH (benign prostatic hyperplasia) 08/08/2011  . Microscopic hematuria 02/27/2011  . Anxiety 10/03/2010  . Incontinence 07/21/2010  . GERD 10/28/2007  . Essential tremor 08/30/2006  . Essential hypertension 07/06/2006   Past Medical History: Past Medical History:  Diagnosis Date  . Anxiety disorder   . Benign enlargement of prostate   . CAD in native artery 1997   Referred for CABG x3 in 2004 for LAD diagonal bifurcation lesion  . Cervical spinal stenosis   . Complex regional pain syndrome of right upper extremity    Right wrist; L arm  . Coronary atherosclerosis of artery bypass graft June 2013   Occluded SVG-D1; Cardiologist Dr. Herbie BaltimoreHarding  . Gait disorder   . Gastroesophageal reflux disease   . Glaucoma   . Hiatal hernia    GI: Dr Evette CristalGanem  . Hyperlipidemia LDL goal <70   . Hypertension   . Memory loss    Mild  . Obesity   . Peripheral neuropathy    possible peripheral neuropathy  . S/P CABG x 3 2004    LIMA-LAD, SVG to diagonal, SVG to OM  . Tremor, essential    On Primidone  . Unstable angina pectoris [facility](HCC)  June 2013   Cardiac cath: occluded SVG-DI. Patent LIMA-LAD and SVG-OM. EF 45% with apical inferior HK.   Past Surgical History: Past Surgical History:  Procedure Laterality Date  . CARDIAC CATHETERIZATION    . CATARACT EXTRACTION, BILATERAL    . CORONARY ARTERY BYPASS GRAFT  06/2002   LIMA-LAD, SVG-D1, SVG-OM  . EYE SURGERY    . KNEE SURGERY Left   . LEFT HEART CATHETERIZATION WITH CORONARY/GRAFT ANGIOGRAM N/A 10/13/2011   Procedure: LEFT HEART CATHETERIZATION WITH Isabel CapriceORONARY/GRAFT ANGIOGRAM;  Surgeon: Marykay Lexavid W Harding, MD;  [facility];  Service: Cardiovascular;  Laterality: N/A;  . MINOR PLACEMENT OF FIDUCIAL N/A 04/26/2017   Procedure: Fiducial placement;  Surgeon: Maeola HarmanStern, Joseph, MD;  [facility];  Service: Neurosurgery;  Laterality:  N/A;  Fiducial placement  . NM MYOCAR PERF WALL MOTION  03/23/2009   protocol:[place], normal perfusion in all regions, post-stress EF 72%, exercise capacity 7METS, EKG negative for ischemia.  . Shoulder Orthoscopic Surgery   06/2003  . SUBTHALAMIC STIMULATOR INSERTION Bilateral 05/04/2017   Procedure: Bilateral Deep brain stimulator placement;  Surgeon: Maeola HarmanStern, Joseph, MD;  [facility];  Service: Neurosurgery;  Laterality: Bilateral;  Bilateral deep brain stimulator placement  . TRANSTHORACIC ECHOCARDIOGRAM  04/2017  Jan 2019: Normal LV size and function.  EF 55-60%.  No or W MA.  Mild LA dilation.  . wrist ganglion cyst Left     Social History: Social History   Tobacco Use  . Smoking status: Former Smoker    Packs/day: 3.00    Years: 20.00    Pack years: 60.00    Types: Cigarettes    Last attempt to quit: 04/17/1968    Years since quitting: 49.2  . Smokeless tobacco: Never Used  Substance Use Topics  . Alcohol use: No    Frequency: Never  . Drug use: No   Additional social history: Lives at home with his wife.  Former smoker, quit about 40 years ago.  No alcohol or illicit drug use.   Please also refer to relevant sections of EMR.  Family History: Family History  Problem Relation Age of Onset  . Heart disease Mother   . Heart disease Brother   . Tremor Paternal Uncle   . Heart disease Brother   . Drug abuse Daughter   . Hepatitis C Daughter    Allergies and Medications: Allergies  Allergen Reactions  . Lisinopril Cough   Current Facility-Administered Medications on File Prior to Encounter  Medication Dose Route Frequency Provider Last Rate Last Dose  . 0.9 %  sodium chloride infusion   Intravenous Continuous Marykay Lex, MD      . sodium chloride 0.9 % injection 3 mL  3 mL Intravenous PRN Marykay Lex, MD       Current Outpatient Medications on File Prior to Encounter  Medication Sig Dispense Refill  . aspirin 81 MG chewable tablet Chew 1 tablet (81 mg  total) by mouth daily. 30 tablet 0  . atorvastatin (LIPITOR) 40 MG tablet Take 1 tablet (40 mg total) by mouth daily. 90 tablet 3  . baclofen (LIORESAL) 10 MG tablet Take 10 mg by mouth every evening.    . Baclofen 5 MG TABS Take 1 tablet by mouth every morning.    . gabapentin (NEURONTIN) 600 MG tablet Take 0.5 tablets (300 mg total) by mouth 3 (three) times daily. 90 tablet 0  . ketoconazole (NIZORAL) 2 % shampoo Apply 1 application topically 2 (two) times a week. (Patient taking differently: Apply 1 application topically once a week. ) 120 mL 1  . levofloxacin (LEVAQUIN) 500 MG tablet Take 500 mg by mouth daily.    [name] loratadine (CLARITIN) 10 MG tablet Take 1 tablet (10 mg total) by mouth daily. 30 tablet 11  . losartan (COZAAR) 50 MG tablet Take 1 tablet (50 mg total) by mouth daily. 30 tablet 11  . Melatonin 3 MG TABS Take 1 tablet (3 mg total) by mouth at bedtime. 30 tablet 0  . omeprazole (PRILOSEC) 20 MG capsule Take 1 capsule (20 mg total) by mouth daily as needed (acid reflux). 90 capsule 3  . primidone (MYSOLINE) 50 MG tablet Take 50 mg by mouth 2 (two) times daily.    . propranolol (INDERAL) 40 MG tablet Take 1 tablet (40 mg total) by mouth 2 (two) times daily. 60 tablet 11  . QUEtiapine (SEROQUEL) 25 MG tablet Take 0.5 tablets (12.5 mg total) by mouth daily after supper. 30 tablet 0  . tamsulosin (FLOMAX) 0.4 MG CAPS capsule Take 2 capsules (0.8 mg total) by mouth daily after supper. 30 capsule 0  . docusate sodium (COLACE) 100 MG capsule Take 1 capsule (100 mg total) by mouth 2 (two) times daily. (Patient not taking:  Reported on 07/16/2017) 10 capsule 0  . glimepiride (AMARYL) 1 MG tablet Take 1 tablet (1 mg total) by mouth daily with breakfast. (Patient not taking: Reported on 07/16/2017) 30 tablet 0  . HYDROcodone-acetaminophen (NORCO/VICODIN) 5-325 MG tablet Take 1 tablet by mouth every 4 (four) hours as needed for severe pain. (Patient not taking: Reported on 07/16/2017) 20 tablet 0  .  nitroGLYCERIN (NITROSTAT) 0.4 MG SL tablet Place 1 tablet (0.4 mg total) under the tongue every 5 (five) minutes as needed for chest pain. call 911 if chest pain not better 30 tablet 3  . primidone (MYSOLINE) 50 MG tablet Take 1 tablet (50 mg total) by mouth 2 (two) times daily. 60 tablet 0  . Propylene Glycol (SYSTANE BALANCE) 0.6 % SOLN Place 1 drop into both eyes daily as needed (for dry eyes).      Objective: BP (!) 111/57 (BP Location: Right Arm)   Pulse 80   Temp 98.5 F (36.9 C) (Oral)   Resp 18   Ht 5\' 6"  (1.676 m)   Wt 200 lb (90.7 kg)   SpO2 100%   BMI 32.28 kg/m  Exam: General: Pleasant 77 year old male, NAD, [place] in place Eyes: EOMI, PERRLA ENTM: MMM, oropharynx is clear Neck: Supple, no rigidity Cardiovascular: RRR no MRG, 2+ pedal and femoral pulses Respiratory: CTA B, appears comfortable with normal effort on 2 L per [place] Gastrointestinal: Soft, NT ND, + BS Genitourinary: Moderate erythema and edema of scrotum with induration L>R, tender to palpation, no surrounding involvement concerning for cellulitis MSK: No edema or tenderness Derm: Warm, dry, no rash Neuro: Alert, oriented x3, resting tremor present in bilateral upper extremities (this is his baseline) Psych: Normal mood and affect  Labs and Imaging: CBC BMET  Recent Labs  Lab 07/16/17 1528  WBC 9.9  HGB 10.3*  HCT 31.2*  PLT 156   Recent Labs  Lab 07/16/17 1528  NA 137  K 5.1  CL 102  CO2 25  BUN 21*  CREATININE 1.20  GLUCOSE 183*  CALCIUM 9.0     Dg Chest 2 View  Result Date: 07/16/2017 CLINICAL DATA:  Fever and swelling of the scrotum.  Hematuria. EXAM: CHEST - 2 VIEW COMPARISON:  05/21/2017 FINDINGS: Stable postsurgical changes from CABG. Mildly enlarged heart. There is no evidence of lobar airspace consolidation, pleural effusion or pneumothorax. Bilateral densities representing pleural plaques are again seen, the most prominent in the right upper lobe. Low lung volumes. Osseous structures are  without acute abnormality. Soft tissues are grossly normal. IMPRESSION: Low lung volumes with bilateral pleural plaques, stable radiographically. No evidence of lobar consolidation. Electronically Signed   By: Ted Mcalpine M.D.   On: 07/16/2017 16:13   US Scrotum W/doppler  Result Date: 07/16/2017 CLINICAL DATA:  Scrotal redness/swelling x6 weeks, fever EXAM: SCROTAL ULTRASOUND DOPPLER ULTRASOUND OF THE TESTICLES TECHNIQUE: Complete ultrasound examination of the testicles, epididymis, and other scrotal structures was performed. Color and spectral Doppler ultrasound were also utilized to evaluate blood flow to the testicles. COMPARISON:  None. FINDINGS: Right testicle Measurements: 4.2 x 2.7 x 2.3 cm. No mass or microlithiasis visualized. Left testicle Measurements: 4.7 x 3.5 x 2.5 cm. No mass or microlithiasis visualized. Right epididymis:  Normal in size and appearance. Left epididymis:  Normal in size and appearance. Hydrocele:  Small to moderate bilateral hydroceles. Varicocele:  None visualized. Pulsed Doppler interrogation of both testes demonstrates normal low resistance arterial and venous waveforms bilaterally. Additional comments: Scrotal wall thickening/edema. No drainable fluid collection/abscess.  IMPRESSION: Normal sonographic appearance of the bilateral testes. No evidence of testicular torsion. Small to moderate bilateral hydroceles. Scrotal wall thickening/edema. No drainable fluid collection/abscess. Electronically Signed   By: Charline Bills M.D.   On: 07/16/2017 18:51    [name], MD 07/17/2017, 12:03 AM PGY-2, [facility] FPTS Intern pager: [phone], text pages welcome

## 2017-07-16 NOTE — ED Notes (Signed)
Report given to [facility], will be here in about 5 minutes, attempted to call report to RN she will call back and is aware that [facility] is on the way.

## 2017-07-16 NOTE — ED Notes (Signed)
ED TO INPATIENT HANDOFF REPORT  Name/Age/Gender Brendan [name] 77 y.o. male  Code Status Code Status History    Date Active Date Inactive Code Status Order ID Comments User Context   05/18/2017 1612 06/09/2017 1445 Full Code [id number]  Flora Lipps Inpatient   05/18/2017 1612 05/18/2017 1612 Full Code [id number]  Bary Leriche, PA-C Inpatient   05/18/2017 1612 05/18/2017 1612 Full Code [id number]  Cathlyn Parsons, PA-C Inpatient   05/18/2017 1612 05/18/2017 1612 Full Code [id number]  Cathlyn Parsons, PA-C Inpatient   05/15/2017 2348 05/18/2017 1550 Full Code [id number]  Guadalupe Dawn, MD ED   05/04/2017 1642 05/11/2017 1800 Full Code [id number]  Erline Levine, MD Inpatient    Advance Directive Documentation     Most Recent Value  Type of Advance Directive  Healthcare Power of Attorney, Living will [wife is HCPOA]  Pre-existing out of facility DNR order (yellow form or pink MOST form)  -  "MOST" Form in Place?  -      Home/SNF/Other Home  Chief Complaint sent by urologist  Level of Care/Admitting Diagnosis ED Disposition    ED Disposition Condition [place]: [place] [[id number]]  Level of Care: Telemetry [5]  Diagnosis: Sepsis [facility]) [[id number]]  Admitting Physician: Acquanetta Sit Stacy.Beals  Attending Physician: MCDIARMID, TODD D [[id number]]  Estimated length of stay: past midnight tomorrow  Certification:: I certify this patient will need inpatient services for at least 2 midnights  PT Class (Do Not Modify): Inpatient [101]  PT Acc Code (Do Not Modify): Private [1]       Medical History Past Medical History:  Diagnosis Date  . Anxiety disorder   . Benign enlargement of prostate   . CAD in native artery 1997   Referred for CABG x3 in 2004 for LAD diagonal bifurcation lesion  . Cervical spinal stenosis   . Complex regional pain syndrome of right upper extremity    Right wrist; L arm  . Coronary atherosclerosis of artery bypass graft June  2013   Occluded SVG-D1; Cardiologist Dr. Ellyn Hack  . Gait disorder   . Gastroesophageal reflux disease   . Glaucoma   . Hiatal hernia    GI: Dr Penelope Coop  . Hyperlipidemia LDL goal <70   . Hypertension   . Memory loss    Mild  . Obesity   . Peripheral neuropathy    possible peripheral neuropathy  . S/P CABG x 3 2004    LIMA-LAD, SVG to diagonal, SVG to OM  . Tremor, essential    On Primidone  . Unstable angina pectoris [facility])  June 2013   Cardiac cath: occluded SVG-DI. Patent LIMA-LAD and SVG-OM. EF 45% with apical inferior HK.    Allergies Allergies  Allergen Reactions  . Lisinopril Cough    IV Location/Drains/Wounds Patient Lines/Drains/Airways Status   Active Line/Drains/Airways    Name:   Placement date:   Placement time:   Site:   Days:   Peripheral IV 07/16/17 Left Antecubital   07/16/17    -    Antecubital   less than 1   Urethral Catheter Kaitlin, RN Latex;Straight-tip 16 Fr.   07/16/17    1551    Latex;Straight-tip   less than 1   Incision (Closed) 05/04/17 Head Other (Comment)   05/04/17    1153     73   Pressure Injury 05/04/17 Deep Tissue Injury - Purple or maroon localized area of discolored intact skin or blood-filled  blister due to damage of underlying soft tissue from pressure and/or shear.   05/04/17    2025     73          Labs/Imaging Results for orders placed or performed during the hospital encounter of 07/16/17 (from the past 48 hour(s))  Comprehensive metabolic panel     Status: Abnormal   Collection Time: 07/16/17  3:28 PM  Result Value Ref Range   Sodium 137 135 - 145 mmol/L   Potassium 5.1 3.5 - 5.1 mmol/L   Chloride 102 101 - 111 mmol/L   CO2 25 22 - 32 mmol/L   Glucose, Bld 183 (H) 65 - 99 mg/dL   BUN 21 (H) 6 - 20 mg/dL   Creatinine, Ser 1.20 0.61 - 1.24 mg/dL   Calcium 9.0 8.9 - 10.3 mg/dL   Total Protein 7.6 6.5 - 8.1 g/dL   Albumin 3.8 3.5 - 5.0 g/dL   AST 17 15 - 41 U/L   ALT 17 17 - 63 U/L   Alkaline Phosphatase 82 38 - 126 U/L    Total Bilirubin 1.2 0.3 - 1.2 mg/dL   GFR calc non Af Amer 58 (L) >60 mL/min   GFR calc Af Amer >60 >60 mL/min    Comment: (NOTE) The eGFR has been calculated using the CKD EPI equation. This calculation has not been validated in all clinical situations. eGFR's persistently <60 mL/min signify possible Chronic Kidney Disease.    Anion gap 10 5 - 15    Comment: Performed at [facility], [place] [address], [place], [place] [address]  CBC with Differential     Status: Abnormal   Collection Time: 07/16/17  3:28 PM  Result Value Ref Range   WBC 9.9 4.0 - 10.5 K/uL   RBC 3.14 (L) 4.22 - 5.81 MIL/uL   Hemoglobin 10.3 (L) 13.0 - 17.0 g/dL   HCT 31.2 (L) 39.0 - 52.0 %   MCV 99.4 78.0 - 100.0 fL   MCH 32.8 26.0 - 34.0 pg   MCHC 33.0 30.0 - 36.0 g/dL   RDW 13.8 11.5 - 15.5 %   Platelets 156 150 - 400 K/uL   Neutrophils Relative % 88 %   Neutro Abs 8.7 (H) 1.7 - 7.7 K/uL   Lymphocytes Relative 6 %   Lymphs Abs 0.6 (L) 0.7 - 4.0 K/uL   Monocytes Relative 6 %   Monocytes Absolute 0.6 0.1 - 1.0 K/uL   Eosinophils Relative 0 %   Eosinophils Absolute 0.0 0.0 - 0.7 K/uL   Basophils Relative 0 %   Basophils Absolute 0.0 0.0 - 0.1 K/uL    Comment: Performed at [facility], [place] [address] [place], [place] [address]  Protime-INR     Status: Abnormal   Collection Time: 07/16/17  3:28 PM  Result Value Ref Range   Prothrombin Time 15.8 (H) 11.4 - 15.2 seconds   INR 1.27     Comment: Performed at [facility], [place] [address] [place], [place] [address]  I-Stat CG4 Lactic Acid, ED     Status: None   Collection Time: 07/16/17  3:35 PM  Result Value Ref Range   Lactic Acid, Venous 1.58 0.5 - 1.9 mmol/L  Urinalysis, Routine w reflex microscopic     Status: Abnormal   Collection Time: 07/16/17  3:48 PM  Result Value Ref Range   Color, Urine YELLOW YELLOW   APPearance CLEAR CLEAR   Specific Gravity, Urine 1.013 1.005 - 1.030  pH 5.0 5.0 - 8.0    Glucose, UA NEGATIVE NEGATIVE mg/dL   Hgb urine dipstick MODERATE (A) NEGATIVE   Bilirubin Urine NEGATIVE NEGATIVE   Ketones, ur NEGATIVE NEGATIVE mg/dL   Protein, ur 30 (A) NEGATIVE mg/dL   Nitrite NEGATIVE NEGATIVE   Leukocytes, UA NEGATIVE NEGATIVE   RBC / HPF 0-5 0 - 5 RBC/hpf   WBC, UA 0-5 0 - 5 WBC/hpf   Bacteria, UA RARE (A) NONE SEEN   Squamous Epithelial / LPF NONE SEEN NONE SEEN    Comment: Performed at [facility], [place] [address], [place], [place] [address]  I-Stat CG4 Lactic Acid, ED     Status: None   Collection Time: 07/16/17  9:20 PM  Result Value Ref Range   Lactic Acid, Venous 1.29 0.5 - 1.9 mmol/L   Dg Chest 2 View  Result Date: 07/16/2017 CLINICAL DATA:  Fever and swelling of the scrotum.  Hematuria. EXAM: CHEST - 2 VIEW COMPARISON:  05/21/2017 FINDINGS: Stable postsurgical changes from CABG. Mildly enlarged heart. There is no evidence of lobar airspace consolidation, pleural effusion or pneumothorax. Bilateral densities representing pleural plaques are again seen, the most prominent in the right upper lobe. Low lung volumes. Osseous structures are without acute abnormality. Soft tissues are grossly normal. IMPRESSION: Low lung volumes with bilateral pleural plaques, stable radiographically. No evidence of lobar consolidation. Electronically Signed   By: Fidela Salisbury M.D.   On: 07/16/2017 16:13   US Scrotum W/doppler  Result Date: 07/16/2017 CLINICAL DATA:  Scrotal redness/swelling x6 weeks, fever EXAM: SCROTAL ULTRASOUND DOPPLER ULTRASOUND OF THE TESTICLES TECHNIQUE: Complete ultrasound examination of the testicles, epididymis, and other scrotal structures was performed. Color and spectral Doppler ultrasound were also utilized to evaluate blood flow to the testicles. COMPARISON:  None. FINDINGS: Right testicle Measurements: 4.2 x 2.7 x 2.3 cm. No mass or microlithiasis visualized. Left testicle Measurements: 4.7 x 3.5 x 2.5 cm. No mass or  microlithiasis visualized. Right epididymis:  Normal in size and appearance. Left epididymis:  Normal in size and appearance. Hydrocele:  Small to moderate bilateral hydroceles. Varicocele:  None visualized. Pulsed Doppler interrogation of both testes demonstrates normal low resistance arterial and venous waveforms bilaterally. Additional comments: Scrotal wall thickening/edema. No drainable fluid collection/abscess. IMPRESSION: Normal sonographic appearance of the bilateral testes. No evidence of testicular torsion. Small to moderate bilateral hydroceles. Scrotal wall thickening/edema. No drainable fluid collection/abscess. Electronically Signed   By: Julian Hy M.D.   On: 07/16/2017 18:51    Pending Labs Unresulted Labs (From admission, onward)   Start     Ordered   07/16/17 1536  Urine culture  STAT,   STAT     07/16/17 1537   07/16/17 1514  Culture, blood (Routine x 2)  BLOOD CULTURE X 2,   STAT     07/16/17 1514      Vitals/Pain Today's Vitals   07/16/17 1945 07/16/17 2000 07/16/17 2030 07/16/17 2122  BP:  123/60 103/68   Pulse: (!) 103 97 91   Resp: (!) 23 (!) 21 17   Temp:    99.4 F (37.4 C)  TempSrc:    Oral  SpO2: 99% 99% 99%   Weight:      Height:      PainSc:        Isolation Precautions No active isolations  Medications Medications  sodium chloride 0.9 % bolus 500 mL (500 mLs Intravenous New Bag/Given 07/16/17 2120)  acetaminophen (TYLENOL) tablet 650 mg (650 mg  Oral Given 07/16/17 1808)  cefTRIAXone (ROCEPHIN) 2 g in sodium chloride 0.9 % 100 mL IVPB (0 g Intravenous Stopped 07/16/17 1644)  sodium chloride 0.9 % bolus 500 mL (0 mLs Intravenous Stopped 07/16/17 1805)    Mobility non-ambulatory

## 2017-07-16 NOTE — Progress Notes (Signed)
[facility] Pt lives at home with wife. Pt's scrotum is swollen and red, sacrum is red but blanchable, bilateral legs have abrasions ota. [facility] Pt placed on telemetry #13. Pt is A&Ox3. Pt instructed to call for assistance before getting up, pt stated understanding. Admitting MD notified that pt was here and needed admission orders. Will continue to monitor pt. Nelda MarseilleJenny Thacker, RN

## 2017-07-16 NOTE — ED Provider Notes (Signed)
[name] PROGRESSIVE CARE Provider Note   CSN: [id number] Arrival date & time: 07/16/17  1454     History   Chief Complaint Chief Complaint  Patient presents with  . Fever  . swelling of scrotum  . lethargic    HPI Brendan [name] is a 77 y.o. male.  The history is provided by the patient, the spouse and medical records. No language interpreter was used.  Fever      Brendan [name] is a 77 y.o. male who presents to the Emergency Department complaining of fever. Level V caveat due to confusion. History is provided by the patient's wife. She reports that fevers at home for the last two days to 103. She also reports increased confusion and generalized weakness starting today. Two months ago he had a deep brain stimulator placed and had a six week hospitalization. He is been at home for the last four weeks and has been doing well until the last several days. He saw his urologist three days ago and was started on doxycycline for possible urinary tract infection. Today he was given a dose of Levaquin for infection and referred to the emergency department for further evaluation. No recent falls. He had diarrhea yesterday, not now. Per wife he is been having occasional cough with wheeze as well as testicular swelling and pain since hospital discharge. Patient denies any abdominal pain, vomiting.  Past Medical History:  Diagnosis Date  . Anxiety disorder   . Benign enlargement of prostate   . CAD in native artery 1997   Referred for CABG x3 in 2004 for LAD diagonal bifurcation lesion  . Cervical spinal stenosis   . Complex regional pain syndrome of right upper extremity    Right wrist; L arm  . Coronary atherosclerosis of artery bypass graft June 2013   Occluded SVG-D1; Cardiologist Dr. Herbie Baltimore  . Gait disorder   . Gastroesophageal reflux disease   . Glaucoma   . Hiatal hernia    GI: Dr Evette Cristal  . Hyperlipidemia LDL goal <70   . Hypertension   . Memory loss    Mild  . Obesity    . Peripheral neuropathy    possible peripheral neuropathy  . S/P CABG x 3 2004    LIMA-LAD, SVG to diagonal, SVG to OM  . Tremor, essential    On Primidone  . Unstable angina pectoris [facility])  June 2013   Cardiac cath: occluded SVG-DI. Patent LIMA-LAD and SVG-OM. EF 45% with apical inferior HK.    Patient Active Problem List   Diagnosis Date Noted  . Sepsis (HCC) 07/16/2017  . Preoperative cardiovascular examination 07/02/2017  . Labile blood pressure   . Spastic hemiparesis affecting dominant side (HCC)   . Thrombocytopenia (HCC)   . Agitation   . Labile blood glucose   . Neurogenic bladder   . AKI (acute kidney injury) (HCC)   . Dizziness   . Hemiplegia (HCC)   . Sleep disturbance   . Confusion, postoperative   . Hypotension due to drugs   . Acute blood loss anemia   . Prediabetes   . Dysphagia   . Intraparenchymal hemorrhage of brain (HCC) 05/18/2017  . Tremors of nervous system 05/18/2017  . Right sided weakness   . Aphasia   . Intracerebral hemorrhage 05/16/2017  . Cerebral edema (HCC)   . Weakness 05/15/2017  . Glaucoma   . Anxiety state   . Cognitive disorder   . Urinary retention   . Traumatic hemorrhage  of left cerebrum without loss of consciousness (HCC)   . Pressure injury of skin 05/05/2017  . Seborrheic dermatitis of scalp 10/05/2016  . Tremor, essential 10/03/2016  . Cough 06/27/2016  . Rhinitis, allergic 01/12/2014  . CHRONIC STABLE ANGINA 08/06/2013  . Obesity (BMI 30-39.9) 01/30/2013  . Lower extremity edema 01/30/2013  . S/P CABG x 3   . Atherosclerotic heart disease of native coronary artery with angina pectoris (HCC)   . Hyperlipidemia   . Memory problem 07/01/2012  . RSD (reflex sympathetic dystrophy) 05/24/2012  . Pleural plaque with presence of asbestos 05/15/2012  . Anemia, B12 deficiency 03/19/2012  . Atherosclerotic heart disease of artery bypass graft 09/16/2011  . BPH (benign prostatic hyperplasia) 08/08/2011  . Microscopic  hematuria 02/27/2011  . Anxiety 10/03/2010  . Incontinence 07/21/2010  . GERD 10/28/2007  . Essential tremor 08/30/2006  . Essential hypertension 07/06/2006    Past Surgical History:  Procedure Laterality Date  . CARDIAC CATHETERIZATION    . CATARACT EXTRACTION, BILATERAL    . CORONARY ARTERY BYPASS GRAFT  06/2002   LIMA-LAD, SVG-D1, SVG-OM  . EYE SURGERY    . KNEE SURGERY Left   . LEFT HEART CATHETERIZATION WITH CORONARY/GRAFT ANGIOGRAM N/A 10/13/2011   Procedure: LEFT HEART CATHETERIZATION WITH Isabel Caprice;  Surgeon: Marykay Lex, MD;  Location: [facility] CATH LAB;  Service: Cardiovascular;  Laterality: N/A;  . MINOR PLACEMENT OF FIDUCIAL N/A 04/26/2017   Procedure: Fiducial placement;  Surgeon: Maeola Harman, MD;  Location: [facility] OR;  Service: Neurosurgery;  Laterality: N/A;  Fiducial placement  . NM MYOCAR PERF WALL MOTION  03/23/2009   protocol:Bruce, normal perfusion in all regions, post-stress EF 72%, exercise capacity , EKG negative for ischemia.  . Shoulder Orthoscopic Surgery   06/2003  . SUBTHALAMIC STIMULATOR INSERTION Bilateral 05/04/2017   Procedure: Bilateral Deep brain stimulator placement;  Surgeon: Maeola Harman, MD;  Location: [facility] OR;  Service: Neurosurgery;  Laterality: Bilateral;  Bilateral deep brain stimulator placement  . TRANSTHORACIC ECHOCARDIOGRAM  04/2017   Jan 2019: Normal LV size and function.  EF 55-60%.  No or W MA.  Mild LA dilation.  . wrist ganglion cyst Left         Home Medications    Prior to Admission medications   Medication Sig Start Date End Date Taking? Authorizing Provider  aspirin 81 MG chewable tablet Chew 1 tablet (81 mg total) by mouth daily. 05/19/17  Yes Garnette Gunner, MD  atorvastatin (LIPITOR) 40 MG tablet Take 1 tablet (40 mg total) by mouth daily. 06/08/17  Yes Angiulli, Mcarthur Rossetti, PA-C  baclofen (LIORESAL) 10 MG tablet Take 10 mg by mouth every evening.   Yes [provider]  Baclofen 5 MG TABS Take 1 tablet  by mouth every morning.   Yes [provider]  gabapentin (NEURONTIN) 600 MG tablet Take 0.5 tablets (300 mg total) by mouth 3 (three) times daily. 06/08/17  Yes Angiulli, Mcarthur Rossetti, PA-C  ketoconazole (NIZORAL) 2 % shampoo Apply 1 application topically 2 (two) times a week. Patient taking differently: Apply 1 application topically once a week.  10/05/16  Yes Bacigalupo, Marzella Schlein, MD  levofloxacin (LEVAQUIN) 500 MG tablet Take 500 mg by mouth daily.   Yes [provider]  loratadine (CLARITIN) 10 MG tablet Take 1 tablet (10 mg total) by mouth daily. 06/27/16  Yes Leslye Peer, MD  losartan (COZAAR) 50 MG tablet Take 1 tablet (50 mg total) by mouth daily. 06/29/17  Yes Marykay Lex,  MD  Melatonin 3 MG TABS Take 1 tablet (3 mg total) by mouth at bedtime. 06/08/17  Yes Angiulli, Mcarthur Rossetti, PA-C  omeprazole (PRILOSEC) 20 MG capsule Take 1 capsule (20 mg total) by mouth daily as needed (acid reflux). 06/08/17  Yes Angiulli, Mcarthur Rossetti, PA-C  primidone (MYSOLINE) 50 MG tablet Take 50 mg by mouth 2 (two) times daily.   Yes [provider]  propranolol (INDERAL) 40 MG tablet Take 1 tablet (40 mg total) by mouth 2 (two) times daily. 06/29/17  Yes Marykay Lex, MD  QUEtiapine (SEROQUEL) 25 MG tablet Take 0.5 tablets (12.5 mg total) by mouth daily after supper. 06/08/17  Yes Angiulli, Mcarthur Rossetti, PA-C  tamsulosin (FLOMAX) 0.4 MG CAPS capsule Take 2 capsules (0.8 mg total) by mouth daily after supper. 06/08/17  Yes Angiulli, Mcarthur Rossetti, PA-C  docusate sodium (COLACE) 100 MG capsule Take 1 capsule (100 mg total) by mouth 2 (two) times daily. Patient not taking: Reported on 07/16/2017 06/08/17   Angiulli, Mcarthur Rossetti, PA-C  glimepiride (AMARYL) 1 MG tablet Take 1 tablet (1 mg total) by mouth daily with breakfast. Patient not taking: Reported on 07/16/2017 06/08/17   Angiulli, Mcarthur Rossetti, PA-C  HYDROcodone-acetaminophen (NORCO/VICODIN) 5-325 MG tablet Take 1 tablet by mouth every 4 (four) hours as needed  for severe pain. Patient not taking: Reported on 07/16/2017 06/08/17   Angiulli, Mcarthur Rossetti, PA-C  nitroGLYCERIN (NITROSTAT) 0.4 MG SL tablet Place 1 tablet (0.4 mg total) under the tongue every 5 (five) minutes as needed for chest pain. call 911 if chest pain not better 05/01/16   Bacigalupo, Marzella Schlein, MD  primidone (MYSOLINE) 50 MG tablet Take 1 tablet (50 mg total) by mouth 2 (two) times daily. 06/08/17 07/08/17  Angiulli, Mcarthur Rossetti, PA-C  Propylene Glycol (SYSTANE BALANCE) 0.6 % SOLN Place 1 drop into both eyes daily as needed (for dry eyes).    [provider]    Family History Family History  Problem Relation Age of Onset  . Heart disease Mother   . Heart disease Brother   . Tremor Paternal Uncle   . Heart disease Brother   . Drug abuse Daughter   . Hepatitis C Daughter     Social History Social History   Tobacco Use  . Smoking status: Former Smoker    Packs/day: 3.00    Years: 20.00    Pack years: 60.00    Types: Cigarettes    Last attempt to quit: 04/17/1968    Years since quitting: 49.2  . Smokeless tobacco: Never Used  Substance Use Topics  . Alcohol use: No    Frequency: Never  . Drug use: No     Allergies   Lisinopril   Review of Systems Review of Systems  Constitutional: Positive for fever.  All other systems reviewed and are negative.    Physical Exam Updated Vital Signs BP (!) 111/57 (BP Location: Right Arm)   Pulse 80   Temp 98.5 F (36.9 C) (Oral)   Resp 18   Ht 5\' 6"  (1.676 m)   Wt 90.7 kg (200 lb)   SpO2 100%   BMI 32.28 kg/m   Physical Exam  Constitutional: He appears well-developed and well-nourished.  HENT:  Head: Normocephalic and atraumatic.  Cardiovascular: Regular rhythm.  No murmur heard. Tachycardic  Pulmonary/Chest: Effort normal. No respiratory distress.  Decreased air movement and bilateral bases  Abdominal: Soft. There is no rebound and no guarding.  Mild lower abdominal tenderness  Genitourinary:  Genitourinary  Comments: Moderate erythema and edema of scrotum with induration and tenderness of left scrotum.  Musculoskeletal: He exhibits no edema or tenderness.  Neurological: He is alert.  Confused. So to answer questions and slow to follow commands. Resting tremor of bilateral upper extremities and face. Disoriented to time.  Skin: Skin is warm and dry.  Psychiatric:  Unable to assess  Nursing note and vitals reviewed.    ED Treatments / Results  Labs (all labs ordered are listed, but only abnormal results are displayed) Labs Reviewed  COMPREHENSIVE METABOLIC PANEL - Abnormal; Notable for the following components:      Result Value   Glucose, Bld 183 (*)    BUN 21 (*)    GFR calc non Af Amer 58 (*)    All other components within normal limits  CBC WITH DIFFERENTIAL/PLATELET - Abnormal; Notable for the following components:   RBC 3.14 (*)    Hemoglobin 10.3 (*)    HCT 31.2 (*)    Neutro Abs 8.7 (*)    Lymphs Abs 0.6 (*)    All other components within normal limits  PROTIME-INR - Abnormal; Notable for the following components:   Prothrombin Time 15.8 (*)    All other components within normal limits  URINALYSIS, ROUTINE W REFLEX MICROSCOPIC - Abnormal; Notable for the following components:   Hgb urine dipstick MODERATE (*)    Protein, ur 30 (*)    Bacteria, UA RARE (*)    All other components within normal limits  CULTURE, BLOOD (ROUTINE X 2)  CULTURE, BLOOD (ROUTINE X 2)  URINE CULTURE  BASIC METABOLIC PANEL  CBC  I-STAT CG4 LACTIC ACID, ED  I-STAT CG4 LACTIC ACID, ED  I-STAT CG4 LACTIC ACID, ED  I-STAT CG4 LACTIC ACID, ED    EKG EKG Interpretation  Date/Time:  Monday July 16 2017 15:26:13 EDT Ventricular Rate:  104 PR Interval:    QRS Duration: 111 QT Interval:  329 QTC Calculation: 435 R Axis:   -159 Text Interpretation:  Sinus tachycardia Low voltage with right axis deviation Abnormal R-wave progression, late transition Confirmed by Tilden Fossaees, Sasha Rogel [phone]([id number]) on  07/16/2017 5:42:42 PM   Radiology Dg Chest 2 View  Result Date: 07/16/2017 CLINICAL DATA:  Fever and swelling of the scrotum.  Hematuria. EXAM: CHEST - 2 VIEW COMPARISON:  05/21/2017 FINDINGS: Stable postsurgical changes from CABG. Mildly enlarged heart. There is no evidence of lobar airspace consolidation, pleural effusion or pneumothorax. Bilateral densities representing pleural plaques are again seen, the most prominent in the right upper lobe. Low lung volumes. Osseous structures are without acute abnormality. Soft tissues are grossly normal. IMPRESSION: Low lung volumes with bilateral pleural plaques, stable radiographically. No evidence of lobar consolidation. Electronically Signed   By: Ted Mcalpineobrinka  Dimitrova M.D.   On: 07/16/2017 16:13   [place] Scrotum W/doppler  Result Date: 07/16/2017 CLINICAL DATA:  Scrotal redness/swelling x6 weeks, fever EXAM: SCROTAL ULTRASOUND DOPPLER ULTRASOUND OF THE TESTICLES TECHNIQUE: Complete ultrasound examination of the testicles, epididymis, and other scrotal structures was performed. Color and spectral Doppler ultrasound were also utilized to evaluate blood flow to the testicles. COMPARISON:  None. FINDINGS: Right testicle Measurements: 4.2 x 2.7 x 2.3 cm. No mass or microlithiasis visualized. Left testicle Measurements: 4.7 x 3.5 x 2.5 cm. No mass or microlithiasis visualized. Right epididymis:  Normal in size and appearance. Left epididymis:  Normal in size and appearance. Hydrocele:  Small to moderate bilateral hydroceles. Varicocele:  None visualized. Pulsed Doppler interrogation of both testes demonstrates normal low resistance  arterial and venous waveforms bilaterally. Additional comments: Scrotal wall thickening/edema. No drainable fluid collection/abscess. IMPRESSION: Normal sonographic appearance of the bilateral testes. No evidence of testicular torsion. Small to moderate bilateral hydroceles. Scrotal wall thickening/edema. No drainable fluid collection/abscess.  Electronically Signed   By: Charline Bills M.D.   On: 07/16/2017 18:51    Procedures Procedures (including critical care time)  Medications Ordered in ED Medications  MEDLINE mouth rinse (has no administration in time range)  aspirin chewable tablet 81 mg (has no administration in time range)  atorvastatin (LIPITOR) tablet 40 mg (has no administration in time range)  baclofen (LIORESAL) tablet 10 mg (has no administration in time range)  gabapentin (NEURONTIN) capsule 300 mg (has no administration in time range)  loratadine (CLARITIN) tablet 10 mg (has no administration in time range)  losartan (COZAAR) tablet 50 mg (has no administration in time range)  Melatonin TABS 3 mg (3 mg Oral Given 07/17/17 0109)  pantoprazole (PROTONIX) EC tablet 40 mg (has no administration in time range)  primidone (MYSOLINE) tablet 50 mg (50 mg Oral Given 07/17/17 0108)  propranolol (INDERAL) tablet 40 mg (40 mg Oral Given 07/17/17 0109)  QUEtiapine (SEROQUEL) tablet 12.5 mg (has no administration in time range)  tamsulosin (FLOMAX) capsule 0.8 mg (has no administration in time range)  enoxaparin (LOVENOX) injection 40 mg (has no administration in time range)  0.9 %  sodium chloride infusion ( Intravenous New Bag/Given 07/17/17 0109)  acetaminophen (TYLENOL) tablet 650 mg (650 mg Oral Given 07/17/17 0108)    Or  acetaminophen (TYLENOL) suppository 650 mg ( Rectal See Alternative 07/17/17 0108)  traMADol (ULTRAM) tablet 50 mg (has no administration in time range)  polyethylene glycol (MIRALAX / GLYCOLAX) packet 17 g (has no administration in time range)  levofloxacin (LEVAQUIN) IVPB 500 mg (has no administration in time range)  acetaminophen (TYLENOL) tablet 650 mg (650 mg Oral Given 07/16/17 1808)  cefTRIAXone (ROCEPHIN) 2 g in sodium chloride 0.9 % 100 mL IVPB (0 g Intravenous Stopped 07/16/17 1644)  sodium chloride 0.9 % bolus 500 mL (0 mLs Intravenous Stopped 07/16/17 1805)  sodium chloride 0.9 % bolus 500 mL (500  mLs Intravenous New Bag/Given 07/16/17 2120)     Initial Impression / Assessment and Plan / ED Course  I have reviewed the triage vital signs and the nursing notes.  Pertinent labs & imaging results that were available during my care of the patient were reviewed by me and considered in my medical decision making (see chart for details).     Patient here for evaluation of fever, scrotal swelling. He does intermittently cath at home. He is ill appearing on evaluation, febrile and tachycardic. Treated with oral Levaquin prior to ED arrival. Rocephin was added to his coverage. He was treated with IV fluids. UA with no evidence of UTI. Scrotal ultrasound does demonstrate significant soft tissue edema with no evidence of abscess or fluid collection. Discussed with family medicine, who agrees to admit the patient at [facility]. Repeat assessment after Tylenol, antibiotics and fluids patient is feeling improved.  Final Clinical Impressions(s) / ED Diagnoses   Final diagnoses:  Sepsis, due to unspecified organism [facility])  Orchitis and epididymitis    ED Discharge Orders    None       Tilden Fossa, MD 07/17/17 0116

## 2017-07-17 ENCOUNTER — Telehealth: Payer: Self-pay | Admitting: Neurology

## 2017-07-17 DIAGNOSIS — G250 Essential tremor: Secondary | ICD-10-CM

## 2017-07-17 DIAGNOSIS — N451 Epididymitis: Secondary | ICD-10-CM

## 2017-07-17 DIAGNOSIS — Z9689 Presence of other specified functional implants: Secondary | ICD-10-CM

## 2017-07-17 DIAGNOSIS — R509 Fever, unspecified: Secondary | ICD-10-CM | POA: Diagnosis present

## 2017-07-17 DIAGNOSIS — N453 Epididymo-orchitis: Secondary | ICD-10-CM

## 2017-07-17 DIAGNOSIS — D649 Anemia, unspecified: Secondary | ICD-10-CM | POA: Diagnosis present

## 2017-07-17 DIAGNOSIS — D696 Thrombocytopenia, unspecified: Secondary | ICD-10-CM

## 2017-07-17 DIAGNOSIS — R3914 Feeling of incomplete bladder emptying: Secondary | ICD-10-CM

## 2017-07-17 DIAGNOSIS — A419 Sepsis, unspecified organism: Principal | ICD-10-CM

## 2017-07-17 DIAGNOSIS — N5082 Scrotal pain: Secondary | ICD-10-CM | POA: Diagnosis present

## 2017-07-17 DIAGNOSIS — I10 Essential (primary) hypertension: Secondary | ICD-10-CM

## 2017-07-17 DIAGNOSIS — R339 Retention of urine, unspecified: Secondary | ICD-10-CM

## 2017-07-17 DIAGNOSIS — N433 Hydrocele, unspecified: Secondary | ICD-10-CM | POA: Diagnosis present

## 2017-07-17 DIAGNOSIS — N401 Enlarged prostate with lower urinary tract symptoms: Secondary | ICD-10-CM

## 2017-07-17 DIAGNOSIS — N5089 Other specified disorders of the male genital organs: Secondary | ICD-10-CM

## 2017-07-17 LAB — BASIC METABOLIC PANEL
Anion gap: 10 (ref 5–15)
BUN: 21 mg/dL — ABNORMAL HIGH (ref 6–20)
CO2: 23 mmol/L (ref 22–32)
Calcium: 8.3 mg/dL — ABNORMAL LOW (ref 8.9–10.3)
Chloride: 103 mmol/L (ref 101–111)
Creatinine, Ser: 1.19 mg/dL (ref 0.61–1.24)
GFR calc Af Amer: >60 mL/min (ref >60)
GFR calc non Af Amer: 58 mL/min — ABNORMAL LOW (ref >60)
Glucose, Bld: 154 mg/dL — ABNORMAL HIGH (ref 65–99)
Potassium: 4.2 mmol/L (ref 3.5–5.1)
Sodium: 136 mmol/L (ref 135–145)

## 2017-07-17 LAB — LACTIC ACID, PLASMA
Lactic Acid, Venous: 2.4 mmol/L (ref 0.5–1.9)
Lactic Acid, Venous: 1.2 mmol/L (ref 0.5–1.9)

## 2017-07-17 LAB — FERRITIN: Ferritin: 164 ng/mL (ref 24–336)

## 2017-07-17 LAB — CBC
HCT: 27.2 % — ABNORMAL LOW (ref 39.0–52.0)
Hemoglobin: 8.5 g/dL — ABNORMAL LOW (ref 13.0–17.0)
MCH: 31.3 pg (ref 26.0–34.0)
MCHC: 31.3 g/dL (ref 30.0–36.0)
MCV: 100 fL (ref 78.0–100.0)
Platelets: 118 10*3/uL — ABNORMAL LOW (ref 150–400)
RBC: 2.72 MIL/uL — ABNORMAL LOW (ref 4.22–5.81)
RDW: 14.3 % (ref 11.5–15.5)
WBC: 12.8 10*3/uL — ABNORMAL HIGH (ref 4.0–10.5)

## 2017-07-17 LAB — PHENOBARBITAL LEVEL: Phenobarbital: <5 ug/mL — ABNORMAL LOW (ref 15.0–30.0)

## 2017-07-17 LAB — URINE CULTURE: Culture: NO GROWTH

## 2017-07-17 LAB — RETICULOCYTES
RBC.: 2.76 MIL/uL — ABNORMAL LOW (ref 4.22–5.81)
Retic Count, Absolute: 41.4 10*3/uL (ref 19.0–186.0)
Retic Ct Pct: 1.5 % (ref 0.4–3.1)

## 2017-07-17 LAB — FOLATE: Folate: 14.3 ng/mL (ref >5.9)

## 2017-07-17 LAB — IRON AND TIBC
Iron: 12 ug/dL — ABNORMAL LOW (ref 45–182)
Saturation Ratios: 4 % — ABNORMAL LOW (ref 17.9–39.5)
TIBC: 280 ug/dL (ref 250–450)
UIBC: 268 ug/dL

## 2017-07-17 LAB — PSA: Prostatic Specific Antigen: 2.41 ng/mL (ref 0.00–4.00)

## 2017-07-17 LAB — VITAMIN B12: Vitamin B-12: 184 pg/mL (ref 180–914)

## 2017-07-17 MED ORDER — LEVOFLOXACIN 500 MG PO TABS
500.0000 mg | ORAL_TABLET | Freq: Every day | ORAL | Status: DC
  Administered 2017-07-17 – 2017-07-19 (×3): 500 mg via ORAL
  Filled 2017-07-17 (×3): qty 1

## 2017-07-17 MED ORDER — BACLOFEN 10 MG PO TABS
10.0000 mg | ORAL_TABLET | Freq: Every evening | ORAL | Status: DC
  Administered 2017-07-17 – 2017-07-18 (×2): 10 mg via ORAL
  Filled 2017-07-17 (×2): qty 1

## 2017-07-17 MED ORDER — SODIUM CHLORIDE 0.9 % IV SOLN
INTRAVENOUS | Status: DC
  Administered 2017-07-17 – 2017-07-18 (×3): via INTRAVENOUS

## 2017-07-17 MED ORDER — PRIMIDONE 50 MG PO TABS
50.0000 mg | ORAL_TABLET | Freq: Two times a day (BID) | ORAL | Status: DC
  Administered 2017-07-17 – 2017-07-19 (×6): 50 mg via ORAL
  Filled 2017-07-17 (×7): qty 1

## 2017-07-17 MED ORDER — LEVOFLOXACIN 500 MG PO TABS: 500.0000 mg | ORAL_TABLET | Freq: Every day | ORAL | Status: DC

## 2017-07-17 MED ORDER — PROPRANOLOL HCL 10 MG PO TABS
40.0000 mg | ORAL_TABLET | Freq: Two times a day (BID) | ORAL | Status: DC
  Administered 2017-07-17 – 2017-07-19 (×6): 40 mg via ORAL
  Filled 2017-07-17 (×6): qty 4

## 2017-07-17 MED ORDER — VANCOMYCIN HCL IN DEXTROSE 750-5 MG/150ML-% IV SOLN: 750.0000 mg | Freq: Two times a day (BID) | INTRAVENOUS | Status: DC

## 2017-07-17 MED ORDER — TRAMADOL HCL 50 MG PO TABS
50.0000 mg | ORAL_TABLET | Freq: Four times a day (QID) | ORAL | Status: DC | PRN
  Administered 2017-07-17 – 2017-07-19 (×3): 50 mg via ORAL
  Filled 2017-07-17 (×3): qty 1

## 2017-07-17 MED ORDER — GABAPENTIN 300 MG PO CAPS
300.0000 mg | ORAL_CAPSULE | Freq: Three times a day (TID) | ORAL | Status: DC
  Administered 2017-07-17 – 2017-07-19 (×8): 300 mg via ORAL
  Filled 2017-07-17 (×8): qty 1

## 2017-07-17 MED ORDER — ATORVASTATIN CALCIUM 40 MG PO TABS
40.0000 mg | ORAL_TABLET | Freq: Every day | ORAL | Status: DC
  Administered 2017-07-17 – 2017-07-19 (×3): 40 mg via ORAL
  Filled 2017-07-17 (×3): qty 1

## 2017-07-17 MED ORDER — LEVOFLOXACIN IN D5W 500 MG/100ML IV SOLN
500.0000 mg | INTRAVENOUS | Status: DC
  Filled 2017-07-17: qty 100

## 2017-07-17 MED ORDER — TAMSULOSIN HCL 0.4 MG PO CAPS
0.8000 mg | ORAL_CAPSULE | Freq: Every day | ORAL | Status: DC
  Administered 2017-07-17 – 2017-07-18 (×2): 0.8 mg via ORAL
  Filled 2017-07-17 (×2): qty 2

## 2017-07-17 MED ORDER — MELATONIN 3 MG PO TABS
3.0000 mg | ORAL_TABLET | Freq: Every day | ORAL | Status: DC
  Administered 2017-07-17 (×2): 3 mg via ORAL
  Filled 2017-07-17 (×2): qty 1

## 2017-07-17 MED ORDER — ASPIRIN 81 MG PO CHEW
81.0000 mg | CHEWABLE_TABLET | Freq: Every day | ORAL | Status: DC
  Administered 2017-07-17 – 2017-07-19 (×3): 81 mg via ORAL
  Filled 2017-07-17 (×3): qty 1

## 2017-07-17 MED ORDER — ENOXAPARIN SODIUM 40 MG/0.4ML ~~LOC~~ SOLN
40.0000 mg | SUBCUTANEOUS | Status: DC
  Administered 2017-07-17 – 2017-07-19 (×3): 40 mg via SUBCUTANEOUS
  Filled 2017-07-17 (×3): qty 0.4

## 2017-07-17 MED ORDER — ACETAMINOPHEN 650 MG RE SUPP: 650.0000 mg | Freq: Four times a day (QID) | RECTAL | Status: DC | PRN

## 2017-07-17 MED ORDER — LORATADINE 10 MG PO TABS
10.0000 mg | ORAL_TABLET | Freq: Every day | ORAL | Status: DC
  Administered 2017-07-17 – 2017-07-19 (×3): 10 mg via ORAL
  Filled 2017-07-17 (×3): qty 1

## 2017-07-17 MED ORDER — VANCOMYCIN HCL 10 G IV SOLR
1500.0000 mg | Freq: Once | INTRAVENOUS | Status: DC
Start: 2017-07-17 — End: 2017-07-17
  Administered 2017-07-17: 1500 mg via INTRAVENOUS
  Filled 2017-07-17: qty 1500

## 2017-07-17 MED ORDER — PANTOPRAZOLE SODIUM 40 MG PO TBEC
40.0000 mg | DELAYED_RELEASE_TABLET | Freq: Every day | ORAL | Status: DC
  Administered 2017-07-17 – 2017-07-19 (×3): 40 mg via ORAL
  Filled 2017-07-17 (×3): qty 1

## 2017-07-17 MED ORDER — ACETAMINOPHEN 325 MG PO TABS
650.0000 mg | ORAL_TABLET | Freq: Four times a day (QID) | ORAL | Status: DC | PRN
  Administered 2017-07-17 – 2017-07-19 (×2): 650 mg via ORAL
  Filled 2017-07-17 (×2): qty 2

## 2017-07-17 MED ORDER — SODIUM CHLORIDE 0.9 % IV SOLN
INTRAVENOUS | Status: DC
  Administered 2017-07-17: 01:00:00 via INTRAVENOUS

## 2017-07-17 MED ORDER — POLYETHYLENE GLYCOL 3350 17 G PO PACK: 17.0000 g | PACK | Freq: Every day | ORAL | Status: DC | PRN

## 2017-07-17 MED ORDER — QUETIAPINE FUMARATE 25 MG PO TABS
12.5000 mg | ORAL_TABLET | Freq: Every day | ORAL | Status: DC
  Administered 2017-07-17 – 2017-07-18 (×2): 12.5 mg via ORAL
  Filled 2017-07-17 (×3): qty 1

## 2017-07-17 MED ORDER — LOSARTAN POTASSIUM 50 MG PO TABS
50.0000 mg | ORAL_TABLET | Freq: Every day | ORAL | Status: DC
  Administered 2017-07-17: 50 mg via ORAL
  Filled 2017-07-17: qty 1

## 2017-07-17 NOTE — Progress Notes (Addendum)
Family Medicine Teaching Service Daily Progress Note Intern Pager: [phone]  Patient name: Brendan [name] Medical record number: [id number] Date of birth: [birth year] Age: 77 y.o. Gender: male  Primary Care Provider: [name], MD Consultants: Urology Code Status: Full  Pt Overview and Major Events to Date:  Brendan [name] is a 77 y.o. male presenting with fever in addition to scrotal pain and swelling. PMH is significant for HTN, h/o CAD with CABG x3, HLD, essential tremor s/p deep brain stimulator placement, GERD, BPH, anxiety.   Assessment and Plan:  Epididymal orchitis Scrotal US showed "Scrotal wall thickening/edema" w/o evidence of torsion or Patient fever defervesce to normal temperature. Tolerating PO. Pain well controlled on oral pain medication. Will transition to PO levaquin today. LA trending down. Blood and urine cultures pending -Urology consulted in ED, appreciate recommendations -Cont levoquin for full ten day course, transfer to oral today -Tylenol and tramadol q6h prn  HTN, stable  -cont home losartan  H/o CAD and CABG x3. Stable -cont home lipitor, ASA  ET s/p deep brain stimulator implant. Stable -cont home propranalol and primidone, likely need to reschedule apt on Wed -PT/OT c/s  BPH.  -On home Flomax -Follow I/O -Urology recommendations appreciated  Chronic Pain - Stable Cont home gabapentin, baclofen  Normocytic Anemia. Hg 10.8 on admit. At baseline of Hg 10-11. Moderate hemoglobin in urine. Likely due Last iron panel on 2015 values wnl.  - anemia panel.  FEN/GI: HHD PPx: Lovenox  Disposition: Inpatient for IV abx  Subjective:  Feels much better. Pain in left testicle is sore, but better  Objective: Temp:  [97.8 F (36.6 C)-104.3 F (40.2 C)] 97.8 F (36.6 C) (04/02 0547) Pulse Rate:  [63-115] 63 (04/02 0547) Resp:  [17-28] 18 (04/02 0547) BP: (103-189)/(57-74) 104/59 (04/02 0547) SpO2:  [87 %-100 %] 100 % (04/02 0547) Weight:   [200 lb (90.7 kg)] 200 lb (90.7 kg) (04/01 1510) Physical Exam: General: NAD, well appearing CVS: RRR, no MRG Lungs: CTAB, no increased work of breathing Abdomen: Soft, nontender, nondistended MSK: No lower extremity edema, 2+ dp GU: Bilateral testicle tenderness, erythema, swelling, L>R. No signs of overlying cellulitis. Foley in place Neuro: Bilateral tremor in upper extremities and head. A&Ox3.  Laboratory: Recent Labs  Lab 07/16/17 1528  WBC 9.9  HGB 10.3*  HCT 31.2*  PLT 156   Recent Labs  Lab 07/16/17 1528  NA 137  K 5.1  CL 102  CO2 25  BUN 21*  CREATININE 1.20  CALCIUM 9.0  PROT 7.6  BILITOT 1.2  ALKPHOS 82  ALT 17  AST 17  GLUCOSE 183*    Imaging/Diagnostic Tests: Dg Chest 2 View  Result Date: 07/16/2017 CLINICAL DATA:  Fever and swelling of the scrotum.  Hematuria. EXAM: CHEST - 2 VIEW COMPARISON:  05/21/2017 FINDINGS: Stable postsurgical changes from CABG. Mildly enlarged heart. There is no evidence of lobar airspace consolidation, pleural effusion or pneumothorax. Bilateral densities representing pleural plaques are again seen, the most prominent in the right upper lobe. Low lung volumes. Osseous structures are without acute abnormality. Soft tissues are grossly normal. IMPRESSION: Low lung volumes with bilateral pleural plaques, stable radiographically. No evidence of lobar consolidation. Electronically Signed   By: Ted Mcalpine M.D.   On: 07/16/2017 16:13   US Scrotum W/doppler  Result Date: 07/16/2017 CLINICAL DATA:  Scrotal redness/swelling x6 weeks, fever EXAM: SCROTAL ULTRASOUND DOPPLER ULTRASOUND OF THE TESTICLES TECHNIQUE: Complete ultrasound examination of the testicles, epididymis, and other scrotal structures was performed.  Color and spectral Doppler ultrasound were also utilized to evaluate blood flow to the testicles. COMPARISON:  None. FINDINGS: Right testicle Measurements: 4.2 x 2.7 x 2.3 cm. No mass or microlithiasis visualized. Left  testicle Measurements: 4.7 x 3.5 x 2.5 cm. No mass or microlithiasis visualized. Right epididymis:  Normal in size and appearance. Left epididymis:  Normal in size and appearance. Hydrocele:  Small to moderate bilateral hydroceles. Varicocele:  None visualized. Pulsed Doppler interrogation of both testes demonstrates normal low resistance arterial and venous waveforms bilaterally. Additional comments: Scrotal wall thickening/edema. No drainable fluid collection/abscess. IMPRESSION: Normal sonographic appearance of the bilateral testes. No evidence of testicular torsion. Small to moderate bilateral hydroceles. Scrotal wall thickening/edema. No drainable fluid collection/abscess. Electronically Signed   By: Charline BillsSriyesh  Krishnan M.D.   On: 07/16/2017 18:51    Garnette Gunnerhompson, Aaron B, MD 07/17/2017, 6:50 AM PGY-1, [facility] FPTS Intern pager: [phone], text pages welcome

## 2017-07-17 NOTE — Progress Notes (Signed)
[facility]  Patient Status: Active (receiving services up to time of hospitalization)  [facility] is providing the following services: RN and ST  If patient discharges after hours, please call [phone].   Brendan FurnishDonna [name] 07/17/2017, 3:23 PM

## 2017-07-17 NOTE — Telephone Encounter (Signed)
Dr. Tat - FYI. 

## 2017-07-17 NOTE — Evaluation (Signed)
Physical Therapy Evaluation Patient Details Name: Brendan [name] MRN: [id number] DOB: 04/24/1940 Today's Date: 07/17/2017   History of Present Illness  Brendan [name] is a 77 y.o. male presenting with fever in addition to scrotal pain and swelling. PMH is significant for HTN, h/o CAD with CABG x3, HLD, essential tremor s/p deep brain stimulator placement, GERD, BPH, anxiety  Clinical Impression  Pt functioning near baseline. Pt with baseline tremor but was able to ambulate and transfer at min guard level. Pt sleeps in a recliner and currently received home health services. Recommend pt to con't use of RW for ALL ambulation and con't with HHPT. Acute PT to con't to follow.     Follow Up Recommendations Home health PT;Supervision/Assistance - 24 hour(con't with current services with advance)    Equipment Recommendations  None recommended by PT    Recommendations for Other Services       Precautions / Restrictions Precautions Precautions: Fall Precaution Comments: constant tremor, h/o of deep brain stimulator Restrictions Weight Bearing Restrictions: No      Mobility  Bed Mobility Overal bed mobility: Needs Assistance Bed Mobility: Supine to Sit     Supine to sit: Min assist     General bed mobility comments: pt reports sleeping in reclines not a bed  Transfers Overall transfer level: Needs assistance Equipment used: Rolling walker (2 wheeled) Transfers: Sit to/from Stand Sit to Stand: Min guard         General transfer comment: pt with good technique pushing up from bed, increased time but able to complete without physical assist  Ambulation/Gait Ambulation/Gait assistance: Min guard Ambulation Distance (Feet): 120 Feet Assistive device: Rolling walker (2 wheeled) Gait Pattern/deviations: Step-to pattern;Decreased stride length;Wide base of support Gait velocity: slow Gait velocity interpretation: Below normal speed for age/gender General Gait Details: pt with  tremor (which is baseline). Pt with wide base of support due to swollen scrotum. mild SOB and onset of fatigue but no episodes of LOB or instability  Stairs            Wheelchair Mobility    Modified Rankin (Stroke Patients Only)       Balance                                             Pertinent Vitals/Pain Pain Assessment: 0-10 Pain Score: 5  Pain Location: scrotum Pain Descriptors / Indicators: Sore    Home Living Family/patient expects to be discharged to:: Private residence Living Arrangements: Spouse/significant other Available Help at Discharge: Family;Available 24 hours/day Type of Home: House Home Access: Stairs to enter Entrance Stairs-Rails: Doctor, general practiceight;Left Entrance Stairs-Number of Steps: 4 Home Layout: One level Home Equipment: Walker - 2 wheels;Cane - single point      Prior Function Level of Independence: Needs assistance   Gait / Transfers Assistance Needed: dtr states he doesn't use walker in house but uses it for long distances  ADL's / Homemaking Assistance Needed: minA for ADLs sometimes due to tremor        Hand Dominance   Dominant Hand: Left    Extremity/Trunk Assessment   Upper Extremity Assessment Upper Extremity Assessment: Generalized weakness    Lower Extremity Assessment Lower Extremity Assessment: Generalized weakness    Cervical / Trunk Assessment Cervical / Trunk Assessment: Kyphotic  Communication   Communication: No difficulties  Cognition Arousal/Alertness: Awake/alert Behavior During Therapy: [facility] for  tasks assessed/performed Overall Cognitive Status: Within Functional Limits for tasks assessed                                        General Comments      Exercises     Assessment/Plan    PT Assessment Patient needs continued PT services  PT Problem List Decreased strength;Decreased activity tolerance;Decreased balance;Decreased mobility       PT Treatment Interventions  DME instruction;Gait training;Stair training;Functional mobility training;Therapeutic activities;Therapeutic exercise;Balance training    PT Goals (Current goals can be found in the Care Plan section)  Acute Rehab PT Goals Patient Stated Goal: home ASAP PT Goal Formulation: With patient Time For Goal Achievement: 07/31/17 Potential to Achieve Goals: Good    Frequency Min 3X/week   Barriers to discharge        Co-evaluation               AM-PAC PT "6 Clicks" Daily Activity  Outcome Measure Difficulty turning over in bed (including adjusting bedclothes, sheets and blankets)?: Unable Difficulty moving from lying on back to sitting on the side of the bed? : Unable Difficulty sitting down on and standing up from a chair with arms (e.g., wheelchair, bedside commode, etc,.)?: A Little Help needed moving to and from a bed to chair (including a wheelchair)?: A Little Help needed walking in hospital room?: A Little Help needed climbing 3-5 steps with a railing? : A Little 6 Click Score: 14    End of Session Equipment Utilized During Treatment: Gait belt Activity Tolerance: Patient tolerated treatment well Patient left: in chair;with call bell/phone within reach;with chair alarm set;with family/visitor present Nurse Communication: Mobility status PT Visit Diagnosis: Unsteadiness on feet (R26.81)    Time: 9562-1308 PT Time Calculation (min) (ACUTE ONLY): 23 min   Charges:   PT Evaluation $PT Eval Moderate Complexity: 1 Mod PT Treatments $Gait Training: 8-22 mins   PT G Codes:        Lewis Shock, PT, DPT Pager #: [phone] Office #: [phone]   [name] 07/17/2017, 2:20 PM

## 2017-07-17 NOTE — Progress Notes (Signed)
Pharmacy Antibiotic Note  [name] W Logue is a 77 y.o. male admitted on 07/16/2017 with scrotal swelling/epididymitis.  Pharmacy has been consulted for Vancomycin dosing.  Plan: Vancomycin 1500 mg IV now then vancomycin 750 mg IV q12h  Height: 5\' 6"  (167.6 cm) Weight: 200 lb (90.7 kg) IBW/kg (Calculated) : 63.8  Temp (24hrs), Avg:101 F (38.3 C), Min:97.8 F (36.6 C), Max:104.3 F (40.2 C)  Recent Labs  Lab 07/16/17 1528 07/16/17 1535 07/16/17 2120  WBC 9.9  --   --   CREATININE 1.20  --   --   LATICACIDVEN  --  1.58 1.29    Estimated Creatinine Clearance: 57 mL/min (by C-G formula based on SCr of 1.2 mg/dL).    Allergies  Allergen Reactions  . Lisinopril Cough     Eddie Candlebbott, Yalanda Soderman [place] 07/17/2017 7:34 AM

## 2017-07-17 NOTE — Telephone Encounter (Signed)
[facility] and  Had to cancel his appt with Dr Venetia MaxonStern for tomorrow. She just wanted to let you know what was going on. Patient had DBS surgery in Jan. [facility]  [phone]

## 2017-07-18 LAB — BASIC METABOLIC PANEL
Anion gap: 8 (ref 5–15)
BUN: 18 mg/dL (ref 6–20)
CO2: 22 mmol/L (ref 22–32)
Calcium: 8.5 mg/dL — ABNORMAL LOW (ref 8.9–10.3)
Chloride: 107 mmol/L (ref 101–111)
Creatinine, Ser: 1.11 mg/dL (ref 0.61–1.24)
GFR calc Af Amer: >60 mL/min (ref >60)
GFR calc non Af Amer: >60 mL/min (ref >60)
Glucose, Bld: 152 mg/dL — ABNORMAL HIGH (ref 65–99)
Potassium: 3.8 mmol/L (ref 3.5–5.1)
Sodium: 137 mmol/L (ref 135–145)

## 2017-07-18 LAB — CBC
HCT: 28.2 % — ABNORMAL LOW (ref 39.0–52.0)
Hemoglobin: 8.8 g/dL — ABNORMAL LOW (ref 13.0–17.0)
MCH: 31.4 pg (ref 26.0–34.0)
MCHC: 31.2 g/dL (ref 30.0–36.0)
MCV: 100.7 fL — ABNORMAL HIGH (ref 78.0–100.0)
Platelets: 140 10*3/uL — ABNORMAL LOW (ref 150–400)
RBC: 2.8 MIL/uL — ABNORMAL LOW (ref 4.22–5.81)
RDW: 14.2 % (ref 11.5–15.5)
WBC: 9.3 10*3/uL (ref 4.0–10.5)

## 2017-07-18 MED ORDER — ALBUTEROL SULFATE (2.5 MG/3ML) 0.083% IN NEBU
2.5000 mg | INHALATION_SOLUTION | RESPIRATORY_TRACT | Status: AC
  Administered 2017-07-18: 2.5 mg via RESPIRATORY_TRACT
  Filled 2017-07-18: qty 3

## 2017-07-18 MED ORDER — MELATONIN 3 MG PO TABS
3.0000 mg | ORAL_TABLET | Freq: Every day | ORAL | Status: DC
  Administered 2017-07-18: 3 mg via ORAL
  Filled 2017-07-18 (×2): qty 1

## 2017-07-18 MED ORDER — FUROSEMIDE 10 MG/ML IJ SOLN
40.0000 mg | Freq: Once | INTRAMUSCULAR | Status: AC
  Administered 2017-07-18: 40 mg via INTRAVENOUS
  Filled 2017-07-18: qty 4

## 2017-07-18 MED ORDER — VANCOMYCIN HCL IN DEXTROSE 1-5 GM/200ML-% IV SOLN
1000.0000 mg | Freq: Two times a day (BID) | INTRAVENOUS | Status: DC
  Administered 2017-07-18 – 2017-07-19 (×2): 1000 mg via INTRAVENOUS
  Filled 2017-07-18 (×3): qty 200

## 2017-07-18 NOTE — Evaluation (Signed)
Occupational Therapy Evaluation Patient Details Name: Brendan [name] MRN: [id number] DOB: 04-May-1940 Today's Date: 07/18/2017    History of Present Illness Brendan [name] is a 77 y.o. male presenting with fever in addition to scrotal pain and swelling. PMH is significant for HTN, h/o CAD with CABG x3, HLD, essential tremor s/p deep brain stimulator placement, GERD, BPH, anxiety   Clinical Impression   This 77 y/o M presents with the above. At baseline pt is mod independent with functional mobility using RW for longer distances, receiving intermittent minA from family for ADL completion. Pt currently requiring minA for room level functional mobility using RW, MinA for LB ADLs; presenting with generalized weakness and decreased activity tolerance. Pt will benefit from continued acute OT services, recommend follow up [facility] services after discharge home to maximize his overall safety and independence with ADLs and mobility.     Follow Up Recommendations  Home health OT;Supervision/Assistance - 24 hour    Equipment Recommendations  None recommended by OT;Other (comment)(Pt's DME needs are met )           Precautions / Restrictions Precautions Precautions: Fall Precaution Comments: constant tremor, h/o of deep brain stimulator Restrictions Weight Bearing Restrictions: No      Mobility Bed Mobility Overal bed mobility: Needs Assistance Bed Mobility: Supine to Sit     Supine to sit: Min assist     General bed mobility comments: minA to support trunk upright into sitting  Transfers Overall transfer level: Needs assistance Equipment used: Rolling walker (2 wheeled) Transfers: Sit to/from Stand Sit to Stand: Min assist;Min guard         General transfer comment: stood from EOBx2, initially with minA, progressed to MinGuard; pt initially requires VCs for safe hand placement, demonstrates good carryover during session     Balance Overall balance assessment: Needs  assistance Sitting-balance support: Feet supported Sitting balance-Leahy Scale: Good Sitting balance - Comments: static sitting EOB    Standing balance support: Bilateral upper extremity supported;Single extremity supported Standing balance-Leahy Scale: Poor Standing balance comment: reliant on UE support                            ADL either performed or assessed with clinical judgement   ADL Overall ADL's : Needs assistance/impaired Eating/Feeding: Set up;Sitting   Grooming: Set up;Sitting   Upper Body Bathing: Min guard;Sitting   Lower Body Bathing: Min guard;Sit to/from stand   Upper Body Dressing : Min guard;Sitting   Lower Body Dressing: Minimal assistance;Sit to/from stand   Toilet Transfer: Minimal assistance;Ambulation Toilet Transfer Details (indicate cue type and reason): simulated in transfer to recliner  Toileting- Clothing Manipulation and Hygiene: Minimal assistance;Sit to/from stand       Functional mobility during ADLs: Minimal assistance;Rolling walker General ADL Comments: pt completing room level functional mobility using RW; assisted with cleansing buttocks during LB bathing and setup for UB bathing; HR up to 126 with room level activity; pt noted to have increased SOB with activity, SpO2 99% on RA when sitting in recliner after room level ambulation      Vision   Vision Assessment?: No apparent visual deficits                Pertinent Vitals/Pain Pain Assessment: Faces Faces Pain Scale: Hurts little more Pain Location: scrotum Pain Descriptors / Indicators: Sore Pain Intervention(s): Limited activity within patient's tolerance;Monitored during session     Hand Dominance Left   Extremity/Trunk Assessment  Upper Extremity Assessment Upper Extremity Assessment: Generalized weakness;LUE deficits/detail;RUE deficits/detail RUE Deficits / Details: baseline essential tremors LUE Deficits / Details: baseline essential tremors   Lower  Extremity Assessment Lower Extremity Assessment: Defer to PT evaluation   Cervical / Trunk Assessment Cervical / Trunk Assessment: Kyphotic   Communication Communication Communication: No difficulties   Cognition Arousal/Alertness: Awake/alert Behavior During Therapy: WFL for tasks assessed/performed Overall Cognitive Status: Within Functional Limits for tasks assessed                                     General Comments  pt spouse present during session                Home Living Family/patient expects to be discharged to:: Private residence Living Arrangements: Spouse/significant other Available Help at Discharge: Family;Available 24 hours/day Type of Home: House Home Access: Stairs to enter [employer] of Steps: 4 Entrance Stairs-Rails: Right;Left Home Layout: One level     Bathroom Shower/Tub: Tub/shower unit;Walk-in shower   Bathroom Toilet: Standard Bathroom Accessibility: No   Home Equipment: Environmental consultant - 2 wheels;Cane - single point;Bedside commode;Shower seat          Prior Functioning/Environment Level of Independence: Needs assistance  Gait / Transfers Assistance Needed: dtr states he doesn't use walker in house but uses it for long distances ADL's / Homemaking Assistance Needed: minA for ADLs sometimes due to tremor; assist for foley care             OT Problem List: Decreased strength;Decreased activity tolerance;Impaired balance (sitting and/or standing)      OT Treatment/Interventions: Self-care/ADL training;Balance training;Therapeutic activities;DME and/or AE instruction;Therapeutic exercise;Energy conservation;Patient/family education    OT Goals(Current goals can be found in the care plan section) Acute Rehab OT Goals Patient Stated Goal: home ASAP OT Goal Formulation: With patient Time For Goal Achievement: 08/01/17 Potential to Achieve Goals: Good  OT Frequency: Min 2X/week                              AM-PAC PT "6 Clicks" Daily Activity     Outcome Measure Help from another person eating meals?: None Help from another person taking care of personal grooming?: A Little Help from another person toileting, which includes using toliet, bedpan, or urinal?: A Little Help from another person bathing (including washing, rinsing, drying)?: A Little Help from another person to put on and taking off regular upper body clothing?: None Help from another person to put on and taking off regular lower body clothing?: A Little 6 Click Score: 20   End of Session Equipment Utilized During Treatment: Gait belt;Rolling walker Nurse Communication: Mobility status  Activity Tolerance: Patient tolerated treatment well Patient left: in chair;with call bell/phone within reach;with family/visitor present;with nursing/sitter in room(lab present for blood draw)  OT Visit Diagnosis: Muscle weakness (generalized) (M62.81);Unsteadiness on feet (R26.81)                Time: 0826(-34mn when MD present )-0900 OT Time Calculation (min): 34 min Charges:  OT General Charges $OT Visit: 1 Visit OT Evaluation $OT Eval Moderate Complexity: 1 Mod OT Treatments $Self Care/Home Management : 8-22 mins G-Codes:     BLou Cal OT Pager [phone]/06/2017   BRaymondo Band4/06/2017, 9:40 AM

## 2017-07-18 NOTE — Progress Notes (Signed)
Pharmacy Antibiotic Note  Brendan [name] is a 77 y.o. male admitted on 07/16/2017 with scrotal swelling/epididymitis.  Pharmacy has been re-consulted for Vancomycin dosing.  Pt received  IV Vanc x 1 on 4/2 at ~ 1000.  Plan: Start Vancomycin 1000 mg IV q12h Follow-up cx data, renal function, clinical progress  Height:  (167.6 cm) Weight: 200 lb (90.7 kg) IBW/kg (Calculated) : 63.8  Temp (24hrs), Avg:98.9 F (37.2 C), Min:98 F (36.7 C), Max:99.6 F (37.6 C)  Recent Labs  Lab 07/16/17 1528 07/16/17 1535 07/16/17 2120 07/17/17 0641 07/17/17 1507 07/17/17 1934 07/18/17 0904  WBC 9.9  --   --  12.8*  --   --  9.3  CREATININE 1.20  --   --  1.19  --   --  1.11  LATICACIDVEN  --  1.58 1.29  --  2.4* 1.2  --     Estimated Creatinine Clearance: 61.6 mL/min (by C-G formula based on SCr of 1.11 mg/dL).    Allergies  Allergen Reactions  . Lisinopril Cough    [employer], Pharm.D., BCPS Clinical Pharmacist Pager: [phone] Clinical phone for 07/18/2017 from 8:30-4:00 is [phone]. After 4pm, please call Main [facility] [phone]) for assistance. 07/18/2017 11:51 AM

## 2017-07-18 NOTE — Progress Notes (Signed)
Physical Therapy Treatment Patient Details Name: Brendan [name] MRN: [id number] DOB: 01/08/1941 Today's Date: 07/18/2017    History of Present Illness Brendan [name] is a 77 y.o. male presenting with fever in addition to scrotal pain and swelling. PMH is significant for HTN, h/o CAD with CABG x3, HLD, essential tremor s/p deep brain stimulator placement, GERD, BPH, anxiety    PT Comments    Patient is progressing very well towards their physical therapy goals. Patient and patient wife state he is close to functional baseline and have no further questions/concerns. Patient stating, "he just feels slower than normal." Patient spouse seems supportive and interactive with treatment. Climbed 3 steps with bilateral railings and min guard assist to prepare for discharge home. Continue to progress endurance.    Follow Up Recommendations  Home health PT;Supervision/Assistance - 24 hour(con't with current services with advance)     Equipment Recommendations  None recommended by PT    Recommendations for Other Services       Precautions / Restrictions Precautions Precautions: Fall Precaution Comments: constant tremor, h/o of deep brain stimulator Restrictions Weight Bearing Restrictions: No    Mobility  Bed Mobility Overal bed mobility: Needs Assistance Bed Mobility: Supine to Sit     Supine to sit: Min assist     General bed mobility comments: Increased time and effort. Min assist at shoulder to sit upright. Patient sleeps in a recliner at home.  Transfers Overall transfer level: Needs assistance Equipment used: Rolling walker (2 wheeled) Transfers: Sit to/from Stand Sit to Stand: Min assist         General transfer comment: Min assist to help power up from lowered bed height  Ambulation/Gait Ambulation/Gait assistance: Min guard Ambulation Distance (Feet): 120 Feet Assistive device: Rolling walker (2 wheeled) Gait Pattern/deviations: Step-to pattern;Decreased stride  length;Wide base of support Gait velocity: slow Gait velocity interpretation: Below normal speed for age/gender General Gait Details: pt with tremor (which is baseline). Pt with wide base of support due to swollen scrotum. Min VC's for RW proximity. DOE 2/4   Stairs Stairs: Yes   Stair Management: Two rails Number of Stairs: 3 General stair comments: Patient assuming wider BOS for descending steps.   Wheelchair Mobility    Modified Rankin (Stroke Patients Only)       Balance Overall balance assessment: Needs assistance Sitting-balance support: Feet supported Sitting balance-Leahy Scale: Good Sitting balance - Comments: static sitting EOB    Standing balance support: Bilateral upper extremity supported Standing balance-Leahy Scale: Poor Standing balance comment: reliant on UE support                             Cognition Arousal/Alertness: Awake/alert Behavior During Therapy: WFL for tasks assessed/performed Overall Cognitive Status: Within Functional Limits for tasks assessed                                        Exercises      General Comments General comments (skin integrity, edema, etc.): Patient spouse present during session      Pertinent Vitals/Pain Pain Assessment: No/denies pain Faces Pain Scale: Hurts little more Pain Location: scrotum Pain Descriptors / Indicators: Sore Pain Intervention(s): Limited activity within patient's tolerance;Monitored during session    Home Living               Home Equipment: Walker - 2  wheels;Cane - single point;Bedside commode;Shower seat      Prior Function      ADL's / Homemaking Assistance Needed: minA for ADLs sometimes due to tremor; assist for foley care      PT Goals (current goals can now be found in the care plan section) Acute Rehab PT Goals Patient Stated Goal: home ASAP PT Goal Formulation: With patient Time For Goal Achievement: 07/31/17 Potential to Achieve Goals:  Good Progress towards PT goals: Progressing toward goals    Frequency    Min 3X/week      PT Plan Current plan remains appropriate    Co-evaluation              AM-PAC PT "6 Clicks" Daily Activity  Outcome Measure  Difficulty turning over in bed (including adjusting bedclothes, sheets and blankets)?: Unable Difficulty moving from lying on back to sitting on the side of the bed? : Unable Difficulty sitting down on and standing up from a chair with arms (e.g., wheelchair, bedside commode, etc,.)?: A Little Help needed moving to and from a bed to chair (including a wheelchair)?: A Little Help needed walking in hospital room?: A Little Help needed climbing 3-5 steps with a railing? : A Little 6 Click Score: 14    End of Session Equipment Utilized During Treatment: Gait belt Activity Tolerance: Patient tolerated treatment well Patient left: with call bell/phone within reach;with family/visitor present;in bed;with bed alarm set   PT Visit Diagnosis: Unsteadiness on feet (R26.81)     Time: [id number] PT Time Calculation (min) (ACUTE ONLY): 23 min  Charges:  $Gait Training: 8-22 mins $Therapeutic Activity: 8-22 mins                    G Codes:      Laurina Bustlearoline Adreena Willits, PT, DPT Acute Rehabilitation Services  Pager: [phone]    Vanetta MuldersCarloine H Adra Shepler 07/18/2017, 12:55 PM

## 2017-07-18 NOTE — Consult Note (Signed)
Urology Consult  Consulting MD: Peterson Lombard, MD  CC: Epididymitis  HPI: This is a Brendan [name] recently admitted to the medicine service for further management of epididymitis.  The patient had deep brain stimulator placed several weeks ago.  He suffered a postoperative bleed in his left hemisphere postoperatively and has right-sided hemiplegia.  Following the procedure, the patient has had problems urinating.  He has incomplete emptying, and has been on in and out catheterization at least twice a day for over a month.  He is on 2 Flomax capsules at night.  The patient has experienced left scrotal swelling and was treated on an outpatient basis through our office with antibiotics.  The patient was initially placed on Bactrim, switched to Levaquin once his culture came back positive for Pseudomonas.  He was last seen on 4/1 with significant mental status changes and swelling getting worse in the scrotal area.  He was subsequently sent to the emergency room for further management.  In the hospital, he has been on dual medical therapy including Levaquin and vancomycin.  He has had improvement of the scrotal swelling, as well as his pain.  He has been afebrile and has a decreasing trend to his white blood cell count.  Blood cultures have been negative.  PMH: Past Medical History:  Diagnosis Date  . Anxiety disorder   . Benign enlargement of prostate   . CAD in native artery 1997   Referred for CABG x3 in 2004 for LAD diagonal bifurcation lesion  . Cervical spinal stenosis   . Complex regional pain syndrome of right upper extremity    Right wrist; L arm  . Coronary atherosclerosis of artery bypass graft June 2013   Occluded SVG-D1; Cardiologist Dr. Herbie Baltimore  . Gait disorder   . Gastroesophageal reflux disease   . Glaucoma   . Hiatal hernia    GI: Dr Evette Cristal  . Hyperlipidemia LDL goal <70   . Hypertension   . Memory loss    Mild  . Obesity   . Peripheral neuropathy    possible peripheral  neuropathy  . S/P CABG x 3 2004    LIMA-LAD, SVG to diagonal, SVG to OM  . Tremor, essential    On Primidone  . Unstable angina pectoris [facility])  June 2013   Cardiac cath: occluded SVG-DI. Patent LIMA-LAD and SVG-OM. EF 45% with apical inferior HK.    PSH: Past Surgical History:  Procedure Laterality Date  . CARDIAC CATHETERIZATION    . CATARACT EXTRACTION, BILATERAL    . CORONARY ARTERY BYPASS GRAFT  06/2002   LIMA-LAD, SVG-D1, SVG-OM  . EYE SURGERY    . KNEE SURGERY Left   . LEFT HEART CATHETERIZATION WITH CORONARY/GRAFT ANGIOGRAM N/A 10/13/2011   Procedure: LEFT HEART CATHETERIZATION WITH Isabel Caprice;  Surgeon: Marykay Lex, MD;  Location: [facility] CATH LAB;  Service: Cardiovascular;  Laterality: N/A;  . MINOR PLACEMENT OF FIDUCIAL N/A 04/26/2017   Procedure: Fiducial placement;  Surgeon: Maeola Harman, MD;  Location: [facility] OR;  Service: Neurosurgery;  Laterality: N/A;  Fiducial placement  . NM MYOCAR PERF WALL MOTION  03/23/2009   protocol:Bruce, normal perfusion in all regions, post-stress EF 72%, exercise capacity , EKG negative for ischemia.  . Shoulder Orthoscopic Surgery   06/2003  . SUBTHALAMIC STIMULATOR INSERTION Bilateral 05/04/2017   Procedure: Bilateral Deep brain stimulator placement;  Surgeon: Maeola Harman, MD;  Location: [facility] OR;  Service: Neurosurgery;  Laterality: Bilateral;  Bilateral deep brain stimulator placement  . TRANSTHORACIC ECHOCARDIOGRAM  04/2017   Jan 2019: Normal LV size and function.  EF 55-60%.  No or W MA.  Mild LA dilation.  . wrist ganglion cyst Left     Allergies: Allergies  Allergen Reactions  . Lisinopril Cough    Medications: Medications Prior to Admission  Medication Sig Dispense Refill Last Dose  . aspirin 81 MG chewable tablet Chew 1 tablet (81 mg total) by mouth daily. 30 tablet 0 07/16/2017 at Unknown time  . atorvastatin (LIPITOR) 40 MG tablet Take 1 tablet (40 mg total) by mouth daily. 90 tablet 3 07/16/2017 at Unknown time   . baclofen (LIORESAL) 10 MG tablet Take 10 mg by mouth every evening.   07/15/2017 at Unknown time  . Baclofen 5 MG TABS Take 1 tablet by mouth every morning.   07/16/2017 at Unknown time  . gabapentin (NEURONTIN) 600 MG tablet Take 0.5 tablets (300 mg total) by mouth 3 (three) times daily. 90 tablet 0 07/16/2017 at Unknown time  . ketoconazole (NIZORAL) 2 % shampoo Apply 1 application topically 2 (two) times a week. (Patient taking differently: Apply 1 application topically once a week. ) 120 mL 1 Past Week at Unknown time  . levofloxacin (LEVAQUIN) 500 MG tablet Take 500 mg by mouth daily.   07/16/2017 at Unknown time  . loratadine (CLARITIN) 10 MG tablet Take 1 tablet (10 mg total) by mouth daily. 30 tablet 11 07/16/2017 at Unknown time  . losartan (COZAAR) 50 MG tablet Take 1 tablet (50 mg total) by mouth daily. 30 tablet 11 07/16/2017 at Unknown time  . Melatonin 3 MG TABS Take 1 tablet (3 mg total) by mouth at bedtime. 30 tablet 0 07/15/2017 at Unknown time  . omeprazole (PRILOSEC) 20 MG capsule Take 1 capsule (20 mg total) by mouth daily as needed (acid reflux). 90 capsule 3 Past Week at Unknown time  . primidone (MYSOLINE) 50 MG tablet Take 50 mg by mouth 2 (two) times daily.   07/16/2017 at Unknown time  . propranolol (INDERAL) 40 MG tablet Take 1 tablet (40 mg total) by mouth 2 (two) times daily. 60 tablet 11 07/16/2017 at 0800  . QUEtiapine (SEROQUEL) 25 MG tablet Take 0.5 tablets (12.5 mg total) by mouth daily after supper. 30 tablet 0 07/15/2017 at Unknown time  . tamsulosin (FLOMAX) 0.4 MG CAPS capsule Take 2 capsules (0.8 mg total) by mouth daily after supper. 30 capsule 0 07/15/2017 at Unknown time  . docusate sodium (COLACE) 100 MG capsule Take 1 capsule (100 mg total) by mouth 2 (two) times daily. (Patient not taking: Reported on 07/16/2017) 10 capsule 0 Not Taking at Unknown time  . glimepiride (AMARYL) 1 MG tablet Take 1 tablet (1 mg total) by mouth daily with breakfast. (Patient not taking: Reported  on 07/16/2017) 30 tablet 0 Not Taking at Unknown time  . HYDROcodone-acetaminophen (NORCO/VICODIN) 5-325 MG tablet Take 1 tablet by mouth every 4 (four) hours as needed for severe pain. (Patient not taking: Reported on 07/16/2017) 20 tablet 0 Completed Course at Unknown time  . nitroGLYCERIN (NITROSTAT) 0.4 MG SL tablet Place 1 tablet (0.4 mg total) under the tongue every 5 (five) minutes as needed for chest pain. call 911 if chest pain not better 30 tablet 3 unknown  . primidone (MYSOLINE) 50 MG tablet Take 1 tablet (50 mg total) by mouth 2 (two) times daily. 60 tablet 0 Taking  . Propylene Glycol (SYSTANE BALANCE) 0.6 % SOLN Place 1 drop into both eyes daily as needed (for dry eyes).  unknown     Social History: Social History   Socioeconomic History  . Marital status: Married    Spouse name: [name]  . Number of children: 2  . Years of education: 10  . Highest education level: Not on file  Occupational History  . Occupation: retired    Comment: Geneticist, molecular - Event organiser x 30 years  Social Needs  . Financial resource strain: Not hard at all  . Food insecurity:    Worry: Never true    Inability: Never true  . Transportation needs:    Medical: No    Non-medical: No  Tobacco Use  . Smoking status: Former Smoker    Packs/day: 3.00    Years: 20.00    Pack years: 60.00    Types: Cigarettes    Last attempt to quit: 04/17/1968    Years since quitting: 49.2  . Smokeless tobacco: Never Used  Substance and Sexual Activity  . Alcohol use: No    Frequency: Never  . Drug use: No  . Sexual activity: Never  Lifestyle  . Physical activity:    Days per week: 0 days    Minutes per session: 0 min  . Stress: Only a little  Relationships  . Social connections:    Talks on phone: More than three times a week    Gets together: Three times a week    Attends religious service: Patient refused    Active member of club or organization: Patient refused    Attends meetings of clubs or  organizations: Patient refused    Relationship status: Married  . Intimate partner violence:    Fear of current or ex partner: No    Emotionally abused: No    Physically abused: No    Forced sexual activity: No  Other Topics Concern  . Not on file  Social History Narrative   Married.  Wife is Royal Piedra.   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   Former smoker, quit 20 years ago. Does not drink alcohol.   Does not get routine exercise, walks sometimes with a cane.   Caffeine use: diet-soda   Left handed     Family History: Family History  Problem Relation Age of Onset  . Heart disease Mother   . Heart disease Brother   . Tremor Paternal Uncle   . Heart disease Brother   . Drug abuse Daughter   . Hepatitis C Daughter     Review of Systems: Positive: Scrotal swelling, bilateral testicular pain, fever, altered mental status, all improving Negative: * A further 10 point review of systems was negative except what is listed in the HPI.  Physical Exam: @VITALS2 @ General: No acute distress.  Awake. Head:  Normocephalic.  Atraumatic. ENT:  EOMI.  Mucous membranes moist Neck:  Supple.  No lymphadenopathy. CV:  S1 present. S2 present. Regular rate. Pulmonary: Equal effort bilaterally.  Clear to auscultation bilaterally. Abdomen: Soft.  Non tender to palpation. Skin:  Normal turgor.  No visible rash. Extremity: No gross deformity of bilateral upper extremities.  No gross deformity of    bilateral lower extremities. Neurologic: Alert. Appropriate mood.  Penis:  Uncircumcised.  No lesions. Scrotum: Moderate generalized edema.  No erythema/induration of the skin.  Bilateral testicular enlargement/firmness bilaterally, left greater than right.  Minimal tenderness.  No fluctuance or crepitus present on scrotum.  Studies:  Recent Labs    07/17/17 0641 07/18/17 0904  HGB 8.5* 8.8*  WBC 12.8* 9.3  PLT 118* 140*  Recent Labs    07/17/17 0641 07/18/17 0904  NA  136 137  K 4.2 3.8  CL 103 107  CO2 23 22  BUN 21* 18  CREATININE 1.19 1.11  CALCIUM 8.3* 8.5*  GFRNONAA 58* >60  GFRAA >60 >60     Recent Labs    07/16/17 1528  INR 1.27     Invalid input(s): ABG    Assessment: 1.  Urinary retention, following CVA on the left.  He is currently managed with an indwelling Foley, but had been doing adequately at home with in and out catheterization.  I do not think he needs a catheter anymore other than for convenience purposes, although he does have swelling of his foreskin  2.  Epididymitis, pseudomonal in nature.  This is improving, and he is no longer having significant pain.  The organism grown out of his urine in our office on 4/1 revealed Pseudomonas sensitive to Levaquin.  Plan: 1.  I think the catheter can come out at any time, and would suggest this be removed on 4/4.  I wrote an order for that  2.  Continue Levaquin for 1 full week postoperatively  3.  Reinstitute in and out catheterization once the Foley catheter is removed  4.  I did recommend that they try to back down to 1 Flomax a day, as the patient still has significant retention despite taking 2 of these.  Risk of orthostasis in this gentleman with neurologic condition is greater than the improvement that he gets from taking this.  5.  I will arrange for the patient to see me in the office within the next 2-3 weeks    Pager:[phone]

## 2017-07-18 NOTE — Progress Notes (Signed)
Family Medicine Teaching Service Daily Progress Note Intern Pager: [phone]  Patient name: Brendan [name] Medical record number: [id number] Date of birth: [birth year] Age: 77 y.o. Gender: male  Primary Care Provider: [name], MD Consultants: Urology Code Status: Full  Pt Overview and Major Events to Date:  Brendan [name] is a 77 y.o. male presenting with fever in addition to scrotal pain and swelling. PMH is significant for HTN, h/o CAD with CABG x3, HLD, essential tremor s/p deep brain stimulator placement, GERD, BPH, anxiety.   Assessment and Plan:  Scrotal Pain/Edema, ?Cellulitis Scrotal US showed "Scrotal wall thickening/edema" w/o evidence of torsion. Normal appearing epidiymis and testicle. Patient initially with fever in the outpatient setting, remains afebrile throughout hospitalization. WBC 9.9>12.8>9.3 after IVF. Transitioned to PO Levaquin yesterday, continues on Vancomycin. Pain well controlled on oral pain medication. LA trended up yesterday afternoon, received IVF, trended down. - Urology consulted in ED, appreciate recommendations - s/p CTX x1 in ED. - Cont levaquin for full ten day course (4/2 - 4/12) - continue Vancomycin for MRSA coverage (4/2 - ) -Tylenol and tramadol q6h prn - blood cx NG<24h - urine cultures no growth  HTN, low normal BP. 96/59 this am. - hold home losartan  H/o CAD and CABG x3. Stable -cont home lipitor, ASA   ET s/p deep brain stimulator implant. Stable -cont home propranalol and primidone, likely need to reschedule apt on Wed -PT/OT c/s - HH  BPH.  -On home Flomax -Follow I/O -Urology recommendations appreciated  Chronic Pain - Stable Cont home gabapentin, baclofen  Normocytic Anemia. Hg 10.8 on admission>8.8 today s/p IVF. Baseline of Hg 10-11. Moderate hemoglobin in urine. Likely due to traumatic catheterization. Iron panel with low iron.  - will monitor, plan to defer treatment with PO iron until after acute illness  subsides  FEN/GI: HHD PPx: Lovenox  Disposition: continue inpatient management of scrotal swelling and pain  Subjective:  Patient feels ok today, feels pain when L scrotum elevated.   Objective: Temp:  [98 F (36.7 C)-99.6 F (37.6 C)] 99.1 F (37.3 C) (04/03 0525) Pulse Rate:  [70-87] 76 (04/03 0525) Resp:  [16-18] 16 (04/03 0525) BP: (98-118)/(45-59) 98/59 (04/03 0525) SpO2:  [96 %-97 %] 97 % (04/03 0525) Physical Exam: General: NAD, well appearing CVS: RRR, no MRG Lungs: CTAB, normal WOB on room air Abdomen: obese abdomen, Soft, nontender, nondistended, no suprapubic pain MSK: No lower extremity edema, 2+ dp GU: Tenderness on elevation of L scrotum, swelling noted to scrotum bilaterally. Slight erythema to L testicle with induration to medial side. No erythema or induration noted to adjacent thigh. Foley in place. Neuro: Bilateral tremor in upper extremities and head. A&Ox3.  Laboratory: Recent Labs  Lab 07/16/17 1528 07/17/17 0641 07/18/17 0904  WBC 9.9 12.8* 9.3  HGB 10.3* 8.5* 8.8*  HCT 31.2* 27.2* 28.2*  PLT 156 118* 140*   Recent Labs  Lab 07/16/17 1528 07/17/17 0641 07/18/17 0904  NA 137 136 137  K 5.1 4.2 3.8  CL 102 103 107  CO2 25 23 22   BUN 21* 21* 18  CREATININE 1.20 1.19 1.11  CALCIUM 9.0 8.3* 8.5*  PROT 7.6  --   --   BILITOT 1.2  --   --   ALKPHOS 82  --   --   ALT 17  --   --   AST 17  --   --   GLUCOSE 183* 154* 152*    Imaging/Diagnostic Tests: Dg Chest 2 View  Result Date: 07/16/2017 CLINICAL DATA:  Fever and swelling of the scrotum.  Hematuria. EXAM: CHEST - 2 VIEW COMPARISON:  05/21/2017 FINDINGS: Stable postsurgical changes from CABG. Mildly enlarged heart. There is no evidence of lobar airspace consolidation, pleural effusion or pneumothorax. Bilateral densities representing pleural plaques are again seen, the most prominent in the right upper lobe. Low lung volumes. Osseous structures are without acute abnormality. Soft tissues  are grossly normal. IMPRESSION: Low lung volumes with bilateral pleural plaques, stable radiographically. No evidence of lobar consolidation. Electronically Signed   By: Ted Mcalpineobrinka  Dimitrova M.D.   On: 07/16/2017 16:13   [place] Scrotum W/doppler  Result Date: 07/16/2017 CLINICAL DATA:  Scrotal redness/swelling x6 weeks, fever EXAM: SCROTAL ULTRASOUND DOPPLER ULTRASOUND OF THE TESTICLES TECHNIQUE: Complete ultrasound examination of the testicles, epididymis, and other scrotal structures was performed. Color and spectral Doppler ultrasound were also utilized to evaluate blood flow to the testicles. COMPARISON:  None. FINDINGS: Right testicle Measurements: 4.2 x 2.7 x 2.3 cm. No mass or microlithiasis visualized. Left testicle Measurements: 4.7 x 3.5 x 2.5 cm. No mass or microlithiasis visualized. Right epididymis:  Normal in size and appearance. Left epididymis:  Normal in size and appearance. Hydrocele:  Small to moderate bilateral hydroceles. Varicocele:  None visualized. Pulsed Doppler interrogation of both testes demonstrates normal low resistance arterial and venous waveforms bilaterally. Additional comments: Scrotal wall thickening/edema. No drainable fluid collection/abscess. IMPRESSION: Normal sonographic appearance of the bilateral testes. No evidence of testicular torsion. Small to moderate bilateral hydroceles. Scrotal wall thickening/edema. No drainable fluid collection/abscess. Electronically Signed   By: Charline BillsSriyesh  Krishnan M.D.   On: 07/16/2017 18:51    Ellwood Denseumball, Annina Piotrowski, DO 07/18/2017, 12:14 PM PGY-1, [facility] FPTS Intern pager: [phone], text pages welcome

## 2017-07-19 DIAGNOSIS — N451 Epididymitis: Secondary | ICD-10-CM

## 2017-07-19 DIAGNOSIS — J929 Pleural plaque without asbestos: Secondary | ICD-10-CM

## 2017-07-19 LAB — CBC
HCT: 24.7 % — ABNORMAL LOW (ref 39.0–52.0)
HCT: 30.9 % — ABNORMAL LOW (ref 39.0–52.0)
Hemoglobin: 7.9 g/dL — ABNORMAL LOW (ref 13.0–17.0)
Hemoglobin: 9.6 g/dL — ABNORMAL LOW (ref 13.0–17.0)
MCH: 32.1 pg (ref 26.0–34.0)
MCH: 30.7 pg (ref 26.0–34.0)
MCHC: 32 g/dL (ref 30.0–36.0)
MCHC: 31.1 g/dL (ref 30.0–36.0)
MCV: 100.4 fL — ABNORMAL HIGH (ref 78.0–100.0)
MCV: 98.7 fL (ref 78.0–100.0)
Platelets: 121 10*3/uL — ABNORMAL LOW (ref 150–400)
Platelets: 184 10*3/uL (ref 150–400)
RBC: 2.46 MIL/uL — ABNORMAL LOW (ref 4.22–5.81)
RBC: 3.13 MIL/uL — ABNORMAL LOW (ref 4.22–5.81)
RDW: 14.2 % (ref 11.5–15.5)
RDW: 13.9 % (ref 11.5–15.5)
WBC: 4.8 10*3/uL (ref 4.0–10.5)
WBC: 6.3 10*3/uL (ref 4.0–10.5)

## 2017-07-19 LAB — BASIC METABOLIC PANEL
Anion gap: 8 (ref 5–15)
BUN: 17 mg/dL (ref 6–20)
CO2: 24 mmol/L (ref 22–32)
Calcium: 8.3 mg/dL — ABNORMAL LOW (ref 8.9–10.3)
Chloride: 105 mmol/L (ref 101–111)
Creatinine, Ser: 1.15 mg/dL (ref 0.61–1.24)
GFR calc Af Amer: >60 mL/min (ref >60)
GFR calc non Af Amer: >60 mL/min (ref >60)
Glucose, Bld: 129 mg/dL — ABNORMAL HIGH (ref 65–99)
Potassium: 3.6 mmol/L (ref 3.5–5.1)
Sodium: 137 mmol/L (ref 135–145)

## 2017-07-19 MED ORDER — LEVOFLOXACIN 500 MG PO TABS: 500.0000 mg | ORAL_TABLET | Freq: Every day | ORAL | 0 refills | Status: AC

## 2017-07-19 MED ORDER — FUROSEMIDE 40 MG PO TABS: 40.0000 mg | ORAL_TABLET | ORAL | 0 refills | Status: DC

## 2017-07-19 MED ORDER — TAMSULOSIN HCL 0.4 MG PO CAPS: 0.4000 mg | ORAL_CAPSULE | Freq: Every day | ORAL | Status: DC

## 2017-07-19 MED ORDER — POLYETHYLENE GLYCOL 3350 17 G PO PACK: 17.0000 g | PACK | Freq: Every day | ORAL | 0 refills | Status: DC | PRN

## 2017-07-19 MED ORDER — TAMSULOSIN HCL 0.4 MG PO CAPS: 0.4000 mg | ORAL_CAPSULE | Freq: Every day | ORAL | 0 refills | Status: DC

## 2017-07-19 NOTE — Progress Notes (Signed)
Patient discharge teaching given, including activity, diet, follow-up appoints, and medications. Patient verbalized understanding of all discharge instructions. IV access was d/c'd. Vitals are stable. Skin is intact except as charted in most recent assessments. Pt to be escorted out by NT, to be driven home by family. 

## 2017-07-19 NOTE — Discharge Summary (Addendum)
Family Medicine Teaching [facility] Discharge Summary  Patient name: Brendan [name] Medical record number: [id number] Date of birth: June 30, 1940 Age: 77 y.o. Gender: male Date of Admission: 07/16/2017  Date of Discharge: 07/19/2017  Admitting Physician: Leighton Roach McDiarmid, MD  Primary Care Provider: [name], MD Consultants: Urology  Indication for Hospitalization: Pseudomonas Epiditimytis   Discharge Diagnoses/Problem List:  Patient Active Problem List   Diagnosis Date Noted  . Scrotal pain 07/17/2017  . Hydrocele, bilateral 07/17/2017  . Scrotal wall edema 07/17/2017  . Fever 07/17/2017  . Anemia 07/17/2017  . S/P deep brain stimulator placement   . Epididymitis   . Sepsis (HCC) 07/16/2017  . Preoperative cardiovascular examination 07/02/2017  . Labile blood pressure   . Spastic hemiparesis affecting dominant side (HCC)   . Thrombocytopenia (HCC)   . Agitation   . Labile blood glucose   . History of Enterococcus UTI 05/29/2017  . Neurogenic bladder   . AKI (acute kidney injury) (HCC)   . Dizziness   . Hemiplegia (HCC)   . Sleep disturbance   . Confusion, postoperative   . Hypotension due to drugs   . Acute blood loss anemia   . Prediabetes   . Dysphagia   . Intraparenchymal hemorrhage of brain (HCC) 05/18/2017  . Right sided weakness   . Aphasia   . Intracerebral hemorrhage 05/16/2017  . Cerebral edema (HCC)   . Weakness 05/15/2017  . Glaucoma   . Anxiety state   . Cognitive disorder   . Urinary retention   . Traumatic hemorrhage of left cerebrum without loss of consciousness (HCC)   . Pressure injury of skin 05/05/2017  . Seborrheic dermatitis of scalp 10/05/2016  . Cough 06/27/2016  . Rhinitis, allergic 01/12/2014  . CHRONIC STABLE ANGINA 08/06/2013  . Obesity (BMI 30-39.9) 01/30/2013  . Lower extremity edema 01/30/2013  . S/P CABG x 3   . Atherosclerotic heart disease of native coronary artery with angina pectoris (HCC)   . Hyperlipidemia   .  Memory problem 07/01/2012  . RSD (reflex sympathetic dystrophy) 05/24/2012  . Pleural plaque 05/15/2012  . Anemia, B12 deficiency 03/19/2012  . Atherosclerotic heart disease of artery bypass graft 09/16/2011  . BPH (benign prostatic hyperplasia) 08/08/2011  . Microscopic hematuria 02/27/2011  . Anxiety 10/03/2010  . Incontinence 07/21/2010  . GERD 10/28/2007  . Essential tremor 08/30/2006  . Essential hypertension 07/06/2006   Disposition: Home with HHPT/OT  Discharge Condition: Improved  Discharge Exam:  General: NAD, well appearing CVS: RRR, no MRG Lungs: CTAB, normal WOB on room air Abdomen: obese abdomen, Soft, nontender, nondistended, no suprapubic pain MSK: No lower extremity edema, 2+ dp GU: Tenderness on elevation of L scrotum, swelling noted to scrotum bilaterally. Slight erythema to L testicle with induration to medial side. No erythema or induration noted to adjacent thigh. Foley in place. Neuro: Bilateral tremor in upper extremities and head. A&Ox3.  Brief Hospital Course:  Pseudomonal Epididymitis  Brendan [name] is a 77 y.o. male who presented to the ED from his urologist office febrile to 104F with bilateral testicular edema. He received one dose of levaquin at his office. Patient's urine culture grew pseudomonas from urology lab. Labs on admission were significant for mild leukocytosis of 12.8 and Hg 8.5. UA had Moderate hemoglobin without leukocytes or nitrites. Scrotal ultrasound showed scrotal wall thickening/edema without evidence of torsion or abscess. In ED, patient was started on broad spectrum antibiotics CTX and Vancomycin. CTX was discontinued after one dose due to normal appearing  UA. Vancomycin was continued for 4 days. Urology was consulted and narrowed patient to 7 day course of Levaquin 500 mg QD.   BPH with urinary retention Patient arrived with indwelling foley catheter. Urology discontinued this and placed patient on I/O prn. Urology also decreased  patient flomax dose to 0.4 mg qd due to increased risk of orthostatic hypotension.   SOB with wheeze Patient developed SOB and mild wheezing during in patient. Patient remained stable on RA, but WOB improved s/p one dose of IV lasix and albuterol. CXR on admission showed Low lung volumes with stable bilateral pleural plaques. This finding ins consistant with chronic lung disease with possible hazardous material exposure such as asbestos.   Issues for Follow Up:  1. Follow up with Urology 2-3 weeks after discharge 2. Patient being discharged with albuterol inhaler prn. Please follow lung exam and work up as needed.  3. Patient had macrocytic anemia. Likely due to trauma from placing foley. Iron panel showed low iron. Consider starting oral iron after resolution of symptoms.   Significant Procedures:   Significant Labs and Imaging:  Recent Labs  Lab 07/18/17 0904 07/19/17 0416 07/19/17 1422  WBC 9.3 4.8 6.3  HGB 8.8* 7.9* 9.6*  HCT 28.2* 24.7* 30.9*  PLT 140* 121* 184   Recent Labs  Lab 07/17/17 0641 07/18/17 0904 07/19/17 0416  NA 136 137 137  K 4.2 3.8 3.6  CL 103 107 105  CO2 23 22 24   GLUCOSE 154* 152* 129*  BUN 21* 18 17  CREATININE 1.19 1.11 1.15  CALCIUM 8.3* 8.5* 8.3*    Results/Tests Pending at Time of Discharge: None  Discharge Medications:  Allergies as of 07/19/2017      Reactions   Lisinopril Cough      Medication List    STOP taking these medications   docusate sodium 100 MG capsule Commonly known as:  COLACE   glimepiride 1 MG tablet Commonly known as:  AMARYL   HYDROcodone-acetaminophen 5-325 MG tablet Commonly known as:  NORCO/VICODIN     TAKE these medications   aspirin 81 MG chewable tablet Chew 1 tablet (81 mg total) by mouth daily.   atorvastatin 40 MG tablet Commonly known as:  LIPITOR Take 1 tablet (40 mg total) by mouth daily.   baclofen 10 MG tablet Commonly known as:  LIORESAL Take 10 mg by mouth every evening.   Baclofen 5  MG Tabs Take 1 tablet by mouth every morning.   furosemide 40 MG tablet Commonly known as:  LASIX Take 1 tablet (40 mg total) by mouth once a week. Take 1 dose on 4/5, 4/8, then take one pill every Wednesday.   gabapentin 600 MG tablet Commonly known as:  NEURONTIN Take 0.5 tablets (300 mg total) by mouth 3 (three) times daily.   ketoconazole 2 % shampoo Commonly known as:  NIZORAL Apply 1 application topically 2 (two) times a week. What changed:  when to take this   levofloxacin 500 MG tablet Commonly known as:  LEVAQUIN Take 1 tablet (500 mg total) by mouth daily for 4 days.   loratadine 10 MG tablet Commonly known as:  CLARITIN Take 1 tablet (10 mg total) by mouth daily.   losartan 50 MG tablet Commonly known as:  COZAAR Take 1 tablet (50 mg total) by mouth daily.   Melatonin 3 MG Tabs Take 1 tablet (3 mg total) by mouth at bedtime.   nitroGLYCERIN 0.4 MG SL tablet Commonly known as:  NITROSTAT Place 1 tablet (  0.4 mg total) under the tongue every 5 (five) minutes as needed for chest pain. call 911 if chest pain not better   omeprazole 20 MG capsule Commonly known as:  PRILOSEC Take 1 capsule (20 mg total) by mouth daily as needed (acid reflux).   polyethylene glycol packet Commonly known as:  MIRALAX / GLYCOLAX Take 17 g by mouth daily as needed for mild constipation.   primidone 50 MG tablet Commonly known as:  MYSOLINE Take 1 tablet (50 mg total) by mouth 2 (two) times daily. What changed:  Another medication with the same name was removed. Continue taking this medication, and follow the directions you see here.   propranolol 40 MG tablet Commonly known as:  INDERAL Take 1 tablet (40 mg total) by mouth 2 (two) times daily.   QUEtiapine 25 MG tablet Commonly known as:  SEROQUEL Take 0.5 tablets (12.5 mg total) by mouth daily after supper.   SYSTANE BALANCE 0.6 % Soln Generic drug:  Propylene Glycol Place 1 drop into both eyes daily as needed (for dry  eyes).   tamsulosin 0.4 MG Caps capsule Commonly known as:  FLOMAX Take 1 capsule (0.4 mg total) by mouth daily after supper. What changed:  how much to take       Discharge Instructions: Please refer to Patient Instructions section of EMR for full details.  Patient was counseled important signs and symptoms that should prompt return to medical care, changes in medications, dietary instructions, activity restrictions, and follow up appointments.   Follow-Up Appointments: Follow-up Information    Marcine Matar, MD. Schedule an appointment as soon as possible for a visit in 3 week(s).   Specialty:  Urology Contact information: [address] [place] [address] [phone]        Marykay Lex, MD .   Specialty:  Cardiology Contact information: [address] [place] [address] [phone]        Ellwood Dense, DO. Go on 07/24/2017.   Specialty:  Family Medicine Why:  9:45AM Contact information: [address] [place] [address] [phone]           Garnette Gunner, MD 07/23/2017, 6:04 PM PGY-1, [facility] Family Medicine

## 2017-07-19 NOTE — Consult Note (Signed)
   [facility] Inpatient Consult   07/19/2017  Brendan [name] 1941-01-18 [id number]  Patient is currently active with [facility] for chronic disease management services.  Patient has been engaged by a [employer] and HX with CSW.  Met with the patient and wife at bedside.  Patient was eating lunch. Introduced self and [facility].  She states, "I don't know, it's just been so many calls and everything. But, I did speak with Butch Penny from [facility] while I was here." Expressed post hospital follow up and she said, "I guess so." IV was beeping and she seemed distracted. Patient with a HX of recent stroke with a HX of a deep brain simulator, now with UTI with Epdidymitis.   Patient will be followed for post hospital calls.She did accept a brochure and contact information.  Made Inpatient Case Manager aware that [facility] following. Of note, [facility] Care Management services does not replace or interfere with any services that are needed or arranged by inpatient case management or social work.  For additional questions or referrals please contact:  Natividad Brood, RN BSN [facility] Liaison  [phone] business mobile phone Toll free office [phone]

## 2017-07-19 NOTE — Discharge Instructions (Signed)
Epididymitis Epididymitis is swelling (inflammation) of the epididymis. The epididymis is a cord-like structure that is located along the top and back part of the testicle. It collects and stores sperm from the testicle. This condition can also cause pain and swelling of the testicle and scrotum. Symptoms usually start suddenly (acute epididymitis). Sometimes epididymitis starts gradually and lasts for a while (chronic epididymitis). This type may be harder to treat. What are the causes? In men 35 and younger, this condition is usually caused by a bacterial infection or sexually transmitted disease (STD), such as:  Gonorrhea.  Chlamydia.  In men 35 and older who do not have anal sex, this condition is usually caused by bacteria from a blockage or abnormalities in the urinary system. These can result from:  Having a tube placed into the bladder (urinary catheter).  Having an enlarged or inflamed prostate gland.  Having recent urinary tract surgery.  In men who have a condition that weakens the body's defense system (immune system), such as HIV, this condition can be caused by:  Other bacteria, including tuberculosis and syphilis.  Viruses.  Fungi.  Sometimes this condition occurs without infection. That may happen if urine flows backward into the epididymis after heavy lifting or straining. What increases the risk? This condition is more likely to develop in men:  Who have unprotected sex with more than one partner.  Who have anal sex.  Who have recently had surgery.  Who have a urinary catheter.  Who have urinary problems.  Who have a suppressed immune system.  What are the signs or symptoms? This condition usually begins suddenly with chills, fever, and pain behind the scrotum and in the testicle. Other symptoms include:  Swelling of the scrotum, testicle, or both.  Pain whenejaculatingor urinating.  Pain in the back or belly.  Nausea.  Itching and discharge  from the penis.  Frequent need to pass urine.  Redness and tenderness of the scrotum.  How is this diagnosed? Your health care provider can diagnose this condition based on your symptoms and medical history. Your health care provider will also do a physical exam to ask about your symptoms and check your scrotum and testicle for swelling, pain, and redness. You may also have other tests, including:  Examination of discharge from the penis.  Urine tests for infections, such as STDs.  Your health care provider may test you for other STDs, including HIV. How is this treated? Treatment for this condition depends on the cause. If your condition is caused by a bacterial infection, oral antibiotic medicine may be prescribed. If the bacterial infection has spread to your blood, you may need to receive IV antibiotics. Nonbacterial epididymitis is treated with home care that includes bed rest and elevation of the scrotum. Surgery may be needed to treat:  Bacterial epididymitis that causes pus to build up in the scrotum (abscess).  Chronic epididymitis that has not responded to other treatments.  Follow these instructions at home: Medicines  Take over-the-counter and prescription medicines only as told by your health care provider.  If you were prescribed an antibiotic medicine, take it as told by your health care provider. Do not stop taking the antibiotic even if your condition improves. Sexual Activity  If your epididymitis was caused by an STD, avoid sexual activity until your treatment is complete.  Inform your sexual partner or partners if you test positive for an STD. They may need to be treated.Do not engage in sexual activity with your partner or   partners until their treatment is completed. General instructions  Return to your normal activities as told by your health care provider. Ask your health care provider what activities are safe for you.  Keep your scrotum elevated and  supported while resting. Ask your health care provider if you should wear a scrotal support, such as a jockstrap. Wear it as told by your health care provider.  If directed, apply ice to the affected area: ? Put ice in a plastic bag. ? Place a towel between your skin and the bag. ? Leave the ice on for 20 minutes, 2-3 times per day.  Try taking a sitz bath to help with discomfort. This is a warm water bath that is taken while you are sitting down. The water should only come up to your hips and should cover your buttocks. Do this 3-4 times per day or as told by your health care provider.  Keep all follow-up visits as told by your health care provider. This is important. Contact a health care provider if:  You have a fever.  Your pain medicine is not helping.  Your pain is getting worse.  Your symptoms do not improve within three days. This information is not intended to replace advice given to you by your health care provider. Make sure you discuss any questions you have with your health care provider. Document Released: 03/31/2000 Document Revised: 09/09/2015 Document Reviewed: 08/19/2014 Elsevier Interactive Patient Education  2018 Elsevier Inc.  

## 2017-07-19 NOTE — Progress Notes (Signed)
Family Medicine Teaching Service Daily Progress Note Intern Pager: [phone]  Patient name: Brendan BoJerry W Canul Medical record number: [id number] Date of birth: 03/28/1941 Age: 77 y.o. Gender: male  Primary Care Provider: Freddrick MarchAmin, [name], MD Consultants: Urology Code Status: Full  Pt Overview and Major Events to Date:  Brendan [name] is a 77 y.o. male presenting with fever in addition to scrotal pain and swelling. PMH is significant for HTN, h/o CAD with CABG x3, HLD, essential tremor s/p deep brain stimulator placement, GERD, BPH, anxiety.   Assessment and Plan:  Psuedomonal Epididymitis Evaluated by Urology. Office culture grew pseudomonas. Urology recommended Levaquin for 1 week and outpatient follow up - levaquin for 7 day course (4/2 - 4/9) - stop Vancomycin - Tylenol and tramadol q6h prn - blood cx NG48 - urine cultures no growth   Difficulty breathing, improved Patient had difficulty breathing on exam with some wheezing. This is improved after lasix and albuterol. Satting well on RA. No history of COPD or Asthma.  -will d/c with albuterol PRN  -recommend follow up with PCP for further work up  HTN, BP normal. 116/65 - hold home losartan, can restart on d/c  H/o CAD and CABG x3. Stable -cont home lipitor, ASA    ET s/p deep brain stimulator implant. Stable -cont home propranalol and primidone -PT/OT recommend HHPT/OT  BPH with urinary retention Per urology, decrease flomax to 40 mg daily due to hypotension risk, stop foley catheter, resume in/out  Chronic Pain - Stable Cont home gabapentin, baclofen  Normocytic Anemia. 8.8>7.9. Baseline of Hg 10-11. Moderate hemoglobin in urine. Likely due to traumatic catheterization. Iron panel with low iron.  - will monitor, plan to defer treatment with PO iron until after acute illness subsides  FEN/GI: HHD PPx: Lovenox  Disposition: Discharge home  Subjective:  Testicular pain and breathing are better. Patient is eager to go  home.   Objective: Temp:  [98.2 F (36.8 C)-98.9 F (37.2 C)] 98.2 F (36.8 C) (04/04 0502) Pulse Rate:  [69-78] 72 (04/04 0502) Resp:  [16-18] 17 (04/04 0502) BP: (114-124)/(53-65) 116/65 (04/04 0502) SpO2:  [97 %-99 %] 98 % (04/04 0502) Physical Exam: General: NAD, well appearing CVS: RRR, no MRG Lungs: mild wheezing throughout all lung fields, no crackles, normal WOB on room air Abdomen: obese abdomen, Soft, nontender, nondistended, no suprapubic pain MSK: No lower extremity edema, 2+ dp GU: Mild Tenderness on elevation of L scrotum, improved swelling. Slight erythema to medial side. No erythema or induration noted to adjacent thigh. Foley in place. Neuro: Bilateral tremor in upper extremities and head. A&Ox3.  Laboratory: Recent Labs  Lab 07/17/17 0641 07/18/17 0904 07/19/17 0416  WBC 12.8* 9.3 4.8  HGB 8.5* 8.8* 7.9*  HCT 27.2* 28.2* 24.7*  PLT 118* 140* 121*   Recent Labs  Lab 07/16/17 1528 07/17/17 0641 07/18/17 0904 07/19/17 0416  NA 137 136 137 137  K 5.1 4.2 3.8 3.6  CL 102 103 107 105  CO2 25 23 22 24   BUN 21* 21* 18 17  CREATININE 1.20 1.19 1.11 1.15  CALCIUM 9.0 8.3* 8.5* 8.3*  PROT 7.6  --   --   --   BILITOT 1.2  --   --   --   ALKPHOS 82  --   --   --   ALT 17  --   --   --   AST 17  --   --   --   GLUCOSE 183* 154* 152* 129*  Garnette Gunner, MD 07/19/2017, 5:44 AM PGY-1, [facility] FPTS Intern pager: [phone], text pages welcome

## 2017-07-21 LAB — CULTURE, BLOOD (ROUTINE X 2)
Culture: NO GROWTH
Culture: NO GROWTH

## 2017-07-23 ENCOUNTER — Telehealth: Payer: Self-pay

## 2017-07-23 ENCOUNTER — Other Ambulatory Visit: Payer: Self-pay

## 2017-07-23 NOTE — Telephone Encounter (Signed)
Verbal orders given per office protocol. 

## 2017-07-23 NOTE — Telephone Encounter (Signed)
[name] requesting an order to evaluate and treat for ST. Pt previous having ST before going into the hospital.

## 2017-07-23 NOTE — Patient Outreach (Signed)
Transition of care: Placed call to patient for transition of care. No answer.  PLAN: will continue outreach efforts.  Rowe PavyAmanda Onix Jumper, RN, BSN, CEN [facility] Coordinator [phone]

## 2017-07-23 NOTE — Telephone Encounter (Signed)
Freida BusmanAllen [facility] requesting verbal orders for an onsite RN eval for a scrotal infection for this patient.   Called him back and approved verbal orders.

## 2017-07-24 ENCOUNTER — Encounter: Payer: Self-pay | Admitting: Family Medicine

## 2017-07-24 ENCOUNTER — Ambulatory Visit (INDEPENDENT_AMBULATORY_CARE_PROVIDER_SITE_OTHER): Payer: PPO | Admitting: Family Medicine

## 2017-07-24 ENCOUNTER — Other Ambulatory Visit: Payer: Self-pay

## 2017-07-24 DIAGNOSIS — D62 Acute posthemorrhagic anemia: Secondary | ICD-10-CM

## 2017-07-24 DIAGNOSIS — R600 Localized edema: Secondary | ICD-10-CM | POA: Diagnosis not present

## 2017-07-24 DIAGNOSIS — N5089 Other specified disorders of the male genital organs: Secondary | ICD-10-CM | POA: Diagnosis not present

## 2017-07-24 MED ORDER — ALBUTEROL SULFATE 108 (90 BASE) MCG/ACT IN AEPB: 2.0000 | INHALATION_SPRAY | Freq: Four times a day (QID) | RESPIRATORY_TRACT | 1 refills | Status: DC | PRN

## 2017-07-24 NOTE — Progress Notes (Signed)
Subjective  Patient is presenting for hospital follow up for  Scrotum Infection Thinks is slowly better.  Less red and tender a little.  No fever or chills or vomiting.  Finishing levaquin today.  Has follow up appointment with urology  Leg Edema About the same since left hospital.  Taking lasix 40 mg for 4 doses.  Was on 20 mg weekly in past.  No new shortness of breath but leg edema not improving. Sleeps in recliner wears support hose during day sometimes.  Wife does IO cath three times a day and urinates on his own  Wheezing Feels like he wheezes sometimes.  Was to have albuterol when left hospital but thinks was not prescribed.  No fever or sputum.  Some shortness of breath when he reclines and his hiatal hernia presses on his chest.  No new DOE or chest pain with exertion   Issues for Follow Up: (From DC summary) 1. Follow up with Urology 2-3 weeks after discharge 2. Patient being discharged with albuterol inhaler prn. Please follow lung exam and work up as needed.  3. Patient had macrocytic anemia. Likely due to trauma from placing foley. Iron panel showed low iron. Consider starting oral iron after resolution of symptoms.   Chief Complaint noted Review of Symptoms - see HPI PMH - Smoking status noted.     Objective Vital Signs reviewed Alert nad Walking around office without problems Able to get up on exam table with balance assist Heart - Regular rate and rhythm.  No murmurs, gallops or rubs.    Lungs:  Normal respiratory effort, chest expands symmetrically.breath sounds decreased at bases no crackles or wheezes  Extrem - 2+ edema at mid shin    Assessments/Plans  Acute blood loss anemia Stable Recent ferritin greater than 100.  Would recheck cbc and ferritin in a week to see if needs iron   Lower extremity edema Unchanged. Weight is not significantly changed and exam seems about the same as at discharge.  Will alternate lasix 40 mg with 20 mg qod and follow up in one  week for weight and bmet check   Scrotal wall edema Seems improved.  Finishing antibiotics.  Monitor for worsening    See after visit summary for details of patient instuctions

## 2017-07-24 NOTE — Assessment & Plan Note (Signed)
Seems improved.  Finishing antibiotics.  Monitor for worsening

## 2017-07-24 NOTE — Assessment & Plan Note (Signed)
Unchanged. Weight is not significantly changed and exam seems about the same as at discharge.  Will alternate lasix 40 mg with 20 mg qod and follow up in one week for weight and bmet check

## 2017-07-24 NOTE — Patient Instructions (Signed)
Good to see you today!  Thanks for coming in.  For the swelling/edema  Wear the support hose all the time when up   Take 40 mg even days and 20 mg odd days of Lasix  Weigh yourself - call us if it is going up   For the Scrotum  If it gets worse then call Urology espically if you have fever or more redness  For the Breathing  Use the albuterol only as needed.  If this is getting worse then call us    Come back to see Dr Nelson ChimesAmin in 1 week for a blood test and to look at the edema

## 2017-07-24 NOTE — Assessment & Plan Note (Signed)
Stable Recent ferritin greater than 100.  Would recheck cbc and ferritin in a week to see if needs iron

## 2017-07-25 ENCOUNTER — Other Ambulatory Visit: Payer: Self-pay

## 2017-07-25 ENCOUNTER — Other Ambulatory Visit: Payer: Self-pay | Admitting: Emergency Medicine

## 2017-07-25 ENCOUNTER — Telehealth: Payer: Self-pay

## 2017-07-25 NOTE — Patient Outreach (Signed)
Transition of care: Placed call to patient, wife answered phone and report that patient is doing well. Reports that patient continues to need to be in and out catheterized. Reports unable to void very much on his own. Wife reports decreasing scrotal edema with lasix.  Wife reports that patient continues to be active with home health. Wife denies any current needs from me today.  PLAN: will continue weekly transition of care calls. Encouraged wife to call sooner if needed. Reviewed signs of infection.  Rowe PavyAmanda Conrado Nance, RN, BSN, CEN [facility] Care Coordinator [phone]

## 2017-07-25 NOTE — Telephone Encounter (Signed)
Verbal orders given to [facility]. Ples SpecterAlisa Brake, RN [facility])

## 2017-07-25 NOTE — Telephone Encounter (Signed)
Please give verbal orders for this, thanks!

## 2017-07-25 NOTE — Telephone Encounter (Signed)
Lyla Sonarrie, [facility], left voice message stating that upon discharge, orders were written for PT, OT but not to resume skilled nursing visits.  Requests verbal order for skilled nursing visits for education about meds, self-caths, hydrocele.  Call back number is [phone].  Ples SpecterAlisa Brake, RN [facility] RN)

## 2017-07-26 ENCOUNTER — Telehealth: Payer: Self-pay

## 2017-07-26 NOTE — Telephone Encounter (Signed)
Tamsen SniderJulie [facility] called requesting verbal orders for 1xwk X 2wks then, after reverification of orders, 1xwk X 4wks for speech cognition.  Called her back and approved verbal orders.

## 2017-08-01 ENCOUNTER — Encounter: Payer: Self-pay | Admitting: Family Medicine

## 2017-08-01 ENCOUNTER — Ambulatory Visit (INDEPENDENT_AMBULATORY_CARE_PROVIDER_SITE_OTHER): Payer: PPO | Admitting: Family Medicine

## 2017-08-01 ENCOUNTER — Ambulatory Visit: Payer: PPO | Admitting: Family Medicine

## 2017-08-01 VITALS — BP 127/60 | HR 59 | Temp 97.7°F | Wt 199.8 lb

## 2017-08-01 DIAGNOSIS — D649 Anemia, unspecified: Secondary | ICD-10-CM

## 2017-08-01 DIAGNOSIS — N5089 Other specified disorders of the male genital organs: Secondary | ICD-10-CM

## 2017-08-01 DIAGNOSIS — R600 Localized edema: Secondary | ICD-10-CM | POA: Diagnosis not present

## 2017-08-01 DIAGNOSIS — D62 Acute posthemorrhagic anemia: Secondary | ICD-10-CM

## 2017-08-01 DIAGNOSIS — Z1211 Encounter for screening for malignant neoplasm of colon: Secondary | ICD-10-CM | POA: Diagnosis not present

## 2017-08-01 DIAGNOSIS — I10 Essential (primary) hypertension: Secondary | ICD-10-CM

## 2017-08-01 NOTE — Assessment & Plan Note (Signed)
Improving.

## 2017-08-01 NOTE — Progress Notes (Signed)
Subjective  Brendan [name] is a 77 y.o. male is presenting with the following  Scrotum Infection Feels is slowly better.  Less red and tender a little.  No fever or chills or vomiting.  Taking levaquin 1/2 daily per urology .    Leg Edema Feels is much better.  Taking lasix 20 mg every day.  Sleeps in recliner wears support hose during day sometimes.  Wife does IO cath three times a day and urinates on his own.  PVRs are around 300  Wheezing No wheezing since last visit.  Has not used inhaler.  No shortness of breath with exertion   Anemia -  No lightheadness or chest pain or shortness of breath with exertion. Has not noticed any bleeding.  Not taking iron   Chief Complaint noted Review of Symptoms - see HPI PMH - Smoking status noted.    Objective Vital Signs reviewed BP 127/60 (BP Location: Left Arm, Patient Position: Sitting, Cuff Size: Normal)   Pulse (!) 59   Temp 97.7 F (36.5 C) (Oral)   Wt 199 lb 12.8 oz (90.6 kg)   SpO2 95%   BMI 32.25 kg/m   Heart - Regular rate and rhythm.  No murmurs, gallops or rubs.    Lungs:  Normal respiratory effort, chest expands symmetrically. Lungs are clear to auscultation, no crackles or wheezes. Extrem - traces to 1+ edema at ankles  Scrotum - no erythema still large mass in left scrotum   Assessments/Plans  See after visit summary for details of patient instuctions  Acute blood loss anemia Stable check labs Needs colon cancer screen does not want to do colonoscopy - send cards   Lower extremity edema Improved check labs on chronic lasix.  May attempt to wean at next visit if anemia is resolved   Scrotal wall edema Improving

## 2017-08-01 NOTE — Assessment & Plan Note (Signed)
Improved check labs on chronic lasix.  May attempt to wean at next visit if anemia is resolved

## 2017-08-01 NOTE — Patient Instructions (Addendum)
Keep doing what you are doing  I will call you if your tests are not good.  Otherwise I will send you a letter.  If you do not hear from me with in 2 weeks please call our office.     Send in your stool cards  See Dr Nelson ChimesAmin in 2-3 months

## 2017-08-01 NOTE — Assessment & Plan Note (Signed)
Stable check labs Needs colon cancer screen does not want to do colonoscopy - send cards

## 2017-08-02 ENCOUNTER — Encounter: Payer: Self-pay | Admitting: Family Medicine

## 2017-08-02 ENCOUNTER — Other Ambulatory Visit: Payer: Self-pay

## 2017-08-02 LAB — BASIC METABOLIC PANEL
BUN/Creatinine Ratio: 14 (ref 10–24)
BUN: 10 mg/dL (ref 8–27)
CO2: 29 mmol/L (ref 20–29)
Calcium: 9 mg/dL (ref 8.6–10.2)
Chloride: 103 mmol/L (ref 96–106)
Creatinine, Ser: 0.69 mg/dL — ABNORMAL LOW (ref 0.76–1.27)
GFR calc Af Amer: 108 mL/min/{1.73_m2} (ref >59)
GFR calc non Af Amer: 94 mL/min/{1.73_m2} (ref >59)
Glucose: 152 mg/dL — ABNORMAL HIGH (ref 65–99)
Potassium: 4 mmol/L (ref 3.5–5.2)
Sodium: 144 mmol/L (ref 134–144)

## 2017-08-02 LAB — CBC
Hematocrit: 32.5 % — ABNORMAL LOW (ref 37.5–51.0)
Hemoglobin: 10.1 g/dL — ABNORMAL LOW (ref 13.0–17.7)
MCH: 30.6 pg (ref 26.6–33.0)
MCHC: 31.1 g/dL — ABNORMAL LOW (ref 31.5–35.7)
MCV: 99 fL — ABNORMAL HIGH (ref 79–97)
Platelets: 152 10*3/uL (ref 150–379)
RBC: 3.3 x10E6/uL — ABNORMAL LOW (ref 4.14–5.80)
RDW: 15 % (ref 12.3–15.4)
WBC: 4.4 10*3/uL (ref 3.4–10.8)

## 2017-08-02 LAB — FERRITIN: Ferritin: 44 ng/mL (ref 30–400)

## 2017-08-02 NOTE — Patient Outreach (Signed)
Transition of care: Placed call to patient and spoke with wife who reports that patient is doing well. Continues to be on antibiotics for infection. Reports need for continued in and out catheterizing.  Wife reports patient has follow up appointment with urologist next week.   Denies any new problems or concerns today.  PLAN: transition of care call in 1 week. Encouraged wife to sooner if needed.  Rowe PavyAmanda Cook, RN, BSN, CEN [facility] Coordinator [phone]

## 2017-08-05 DIAGNOSIS — G8110 Spastic hemiplegia affecting unspecified side: Secondary | ICD-10-CM | POA: Diagnosis not present

## 2017-08-05 DIAGNOSIS — I619 Nontraumatic intracerebral hemorrhage, unspecified: Secondary | ICD-10-CM | POA: Diagnosis not present

## 2017-08-06 ENCOUNTER — Telehealth: Payer: Self-pay | Admitting: Neurology

## 2017-08-06 ENCOUNTER — Other Ambulatory Visit: Payer: Self-pay | Admitting: Emergency Medicine

## 2017-08-06 DIAGNOSIS — R3912 Poor urinary stream: Secondary | ICD-10-CM | POA: Diagnosis not present

## 2017-08-06 DIAGNOSIS — R338 Other retention of urine: Secondary | ICD-10-CM | POA: Diagnosis not present

## 2017-08-06 DIAGNOSIS — N401 Enlarged prostate with lower urinary tract symptoms: Secondary | ICD-10-CM | POA: Diagnosis not present

## 2017-08-06 DIAGNOSIS — N453 Epididymo-orchitis: Secondary | ICD-10-CM | POA: Diagnosis not present

## 2017-08-06 MED ORDER — PRIMIDONE 50 MG PO TABS: 50.0000 mg | ORAL_TABLET | Freq: Two times a day (BID) | ORAL | 1 refills | Status: DC

## 2017-08-06 NOTE — Telephone Encounter (Signed)
RX sent to pharmacy  

## 2017-08-06 NOTE — Telephone Encounter (Signed)
If patient is at the pharmacy, call can be transferred to refill team.  1.     Which medications need to be refilled? (please list name of each medication and dose if know) Primidone 50 MG  2.     Which pharmacy/location (including street and city if local pharmacy) is medication to be sent to? [facility]  3.     Do they need a 30 or 90 day supply? Patient's daughter lmom she did not say either.

## 2017-08-07 ENCOUNTER — Telehealth: Payer: Self-pay | Admitting: Neurology

## 2017-08-07 ENCOUNTER — Other Ambulatory Visit: Payer: Self-pay | Admitting: Neurology

## 2017-08-07 MED ORDER — PRIMIDONE 50 MG PO TABS: 50.0000 mg | ORAL_TABLET | Freq: Two times a day (BID) | ORAL | 1 refills | Status: DC

## 2017-08-07 NOTE — Telephone Encounter (Signed)
*  STAT* If patient is at the pharmacy, call can be transferred to refill team.  1.     Which medications need to be refilled? (please list name of each medication and dose if know) Primidone 50mg   2.     Which pharmacy/location (including street and city if local pharmacy) [facility]  3.     Do they need a 30 or 90 day supply Did not state if it was 30 or 90    Pt's daughter left a VM message asking for the refill

## 2017-08-07 NOTE — Telephone Encounter (Signed)
This has been sent

## 2017-08-08 ENCOUNTER — Telehealth: Payer: Self-pay

## 2017-08-08 NOTE — Telephone Encounter (Signed)
Called Brendan [name] and verbal orders given, thanks

## 2017-08-08 NOTE — Telephone Encounter (Signed)
[facility]. Pt due for recertification. Would like to see pt 2x week for 2 weeks, 1x week for 1 week. Her call back for VO [phone] Shawna OrleansMeredith B Thomsen, RN

## 2017-08-09 ENCOUNTER — Other Ambulatory Visit: Payer: Self-pay

## 2017-08-09 NOTE — Patient Outreach (Signed)
Transition of care call:  Placed call to patient and spoke with Mrs. [name] who reports patient is doing better. Reports he has a follow up planned at the [facility] next week and then again in 3 weeks. Wife reports urine is clear now.  Reports no problems with medications. Reports patient is sleeping well. Denies any new problems or concerns today.   PLAN: Will continue weekly transition of care calls.  Rowe PavyAmanda Cook, RN, BSN, CEN [facility] Coordinator [phone]

## 2017-08-10 ENCOUNTER — Ambulatory Visit: Payer: Self-pay

## 2017-08-13 DIAGNOSIS — N453 Epididymo-orchitis: Secondary | ICD-10-CM | POA: Diagnosis not present

## 2017-08-13 DIAGNOSIS — N401 Enlarged prostate with lower urinary tract symptoms: Secondary | ICD-10-CM | POA: Diagnosis not present

## 2017-08-13 DIAGNOSIS — R338 Other retention of urine: Secondary | ICD-10-CM | POA: Diagnosis not present

## 2017-08-14 ENCOUNTER — Other Ambulatory Visit: Payer: Self-pay

## 2017-08-15 ENCOUNTER — Ambulatory Visit: Payer: Self-pay

## 2017-08-15 NOTE — Patient Outreach (Signed)
Transition of care: Placed call to patient who answered the phone. Patient reports that he is doing well. Reports that he is walking well. Denies any urinary problems at this time.    PLAN:  Reviewed plan with patient to follow up in 1 week.  Rowe Pavy, RN, BSN, CEN [facility] [employer] [phone]

## 2017-08-16 DIAGNOSIS — E785 Hyperlipidemia, unspecified: Secondary | ICD-10-CM | POA: Diagnosis not present

## 2017-08-16 DIAGNOSIS — M48061 Spinal stenosis, lumbar region without neurogenic claudication: Secondary | ICD-10-CM | POA: Diagnosis not present

## 2017-08-16 DIAGNOSIS — R1310 Dysphagia, unspecified: Secondary | ICD-10-CM | POA: Diagnosis not present

## 2017-08-16 DIAGNOSIS — G8111 Spastic hemiplegia affecting right dominant side: Secondary | ICD-10-CM | POA: Diagnosis not present

## 2017-08-16 DIAGNOSIS — R41841 Cognitive communication deficit: Secondary | ICD-10-CM | POA: Diagnosis not present

## 2017-08-16 DIAGNOSIS — K219 Gastro-esophageal reflux disease without esophagitis: Secondary | ICD-10-CM | POA: Diagnosis not present

## 2017-08-16 DIAGNOSIS — F419 Anxiety disorder, unspecified: Secondary | ICD-10-CM | POA: Diagnosis not present

## 2017-08-17 ENCOUNTER — Encounter: Payer: PPO | Attending: Registered Nurse | Admitting: Physical Medicine & Rehabilitation

## 2017-08-17 ENCOUNTER — Encounter: Payer: Self-pay | Admitting: Physical Medicine & Rehabilitation

## 2017-08-17 ENCOUNTER — Other Ambulatory Visit: Payer: Self-pay

## 2017-08-17 VITALS — BP 148/73 | HR 70 | Ht 64.0 in | Wt 197.0 lb

## 2017-08-17 DIAGNOSIS — F419 Anxiety disorder, unspecified: Secondary | ICD-10-CM | POA: Diagnosis not present

## 2017-08-17 DIAGNOSIS — Z955 Presence of coronary angioplasty implant and graft: Secondary | ICD-10-CM | POA: Diagnosis not present

## 2017-08-17 DIAGNOSIS — N319 Neuromuscular dysfunction of bladder, unspecified: Secondary | ICD-10-CM | POA: Diagnosis not present

## 2017-08-17 DIAGNOSIS — Z8249 Family history of ischemic heart disease and other diseases of the circulatory system: Secondary | ICD-10-CM | POA: Diagnosis not present

## 2017-08-17 DIAGNOSIS — I619 Nontraumatic intracerebral hemorrhage, unspecified: Secondary | ICD-10-CM | POA: Diagnosis not present

## 2017-08-17 DIAGNOSIS — R339 Retention of urine, unspecified: Secondary | ICD-10-CM | POA: Diagnosis not present

## 2017-08-17 DIAGNOSIS — R451 Restlessness and agitation: Secondary | ICD-10-CM | POA: Diagnosis not present

## 2017-08-17 DIAGNOSIS — I10 Essential (primary) hypertension: Secondary | ICD-10-CM

## 2017-08-17 DIAGNOSIS — I25810 Atherosclerosis of coronary artery bypass graft(s) without angina pectoris: Secondary | ICD-10-CM | POA: Insufficient documentation

## 2017-08-17 DIAGNOSIS — Z87891 Personal history of nicotine dependence: Secondary | ICD-10-CM | POA: Insufficient documentation

## 2017-08-17 DIAGNOSIS — G479 Sleep disorder, unspecified: Secondary | ICD-10-CM

## 2017-08-17 DIAGNOSIS — F09 Unspecified mental disorder due to known physiological condition: Secondary | ICD-10-CM | POA: Diagnosis not present

## 2017-08-17 DIAGNOSIS — R1310 Dysphagia, unspecified: Secondary | ICD-10-CM | POA: Diagnosis not present

## 2017-08-17 DIAGNOSIS — G8110 Spastic hemiplegia affecting unspecified side: Secondary | ICD-10-CM | POA: Insufficient documentation

## 2017-08-17 DIAGNOSIS — G250 Essential tremor: Secondary | ICD-10-CM | POA: Diagnosis not present

## 2017-08-17 DIAGNOSIS — Z9889 Other specified postprocedural states: Secondary | ICD-10-CM | POA: Insufficient documentation

## 2017-08-17 MED ORDER — METHOCARBAMOL 500 MG PO TABS: 500.0000 mg | ORAL_TABLET | Freq: Two times a day (BID) | ORAL | 1 refills | Status: DC | PRN

## 2017-08-17 MED ORDER — GABAPENTIN 600 MG PO TABS: 600.0000 mg | ORAL_TABLET | Freq: Three times a day (TID) | ORAL | 1 refills | Status: DC

## 2017-08-17 MED ORDER — TRAZODONE HCL 50 MG PO TABS: 25.0000 mg | ORAL_TABLET | Freq: Every day | ORAL | 1 refills | Status: DC

## 2017-08-17 NOTE — Progress Notes (Signed)
Subjective:    Patient ID: Brendan [name], male    DOB: 24-Nov-1940, 77 y.o.   MRN: [id number]  HPI 77 year old right-handed male with history of CAD and stenting as well as CABG, memory loss with bilateral deep brain stimulator placement presents for follow up for left frontal intraparenchymal hemorrhage, DBS, debility.  Last clinic visit 06/21/17.  Wife provides majority of history. Since that time, pt's wife states pt has right leg pain, he points to his muscles. Patient was readmitted to the hospital for sepsis since that time, discussed with daughter at that time. Completed therapies. Tremors are the same.  He has had several infections and is followed by Neurology.  He is looking to potentially have batteries placed for DBS in the next few months. BP is slightly elevated.  He is following up with his PCP. No swallowing issues.  Does not sleep well. He is getting I/O caths 3/day. No assisstive device needed at present.  Denies falls.  Pain Inventory Average Pain 7 Pain Right Now 4 My pain is sharp and aching  In the last 24 hours, has pain interfered with the following? General activity 4 Relation with others 0 Enjoyment of life 4 What TIME of day is your pain at its worst? night Sleep (in general) Poor  Pain is worse with: walking, sitting and some activites Pain improves with: medication Relief from Meds: 2  Mobility use a walker ability to climb steps?  yes do you drive?  no  Function retired  Neuro/Psych bladder control problems weakness tremor trouble walking dizziness confusion  Prior Studies Any changes since last visit?  no  Physicians involved in your care Any changes since last visit?  no   Family History  Problem Relation Age of Onset  . Heart disease Mother   . Heart disease Brother   . Tremor Paternal Uncle   . Heart disease Brother   . Drug abuse Daughter   . Hepatitis C Daughter    Social History   Socioeconomic History  . Marital status:  Married    Spouse name: [name]  . Number of children: 2  . Years of education: 10  . Highest education level: Not on file  Occupational History  . Occupation: retired    Comment: Geneticist, molecular - Event organiser x 30 years  Social Needs  . Financial resource strain: Not hard at all  . Food insecurity:    Worry: Never true    Inability: Never true  . Transportation needs:    Medical: No    Non-medical: No  Tobacco Use  . Smoking status: Former Smoker    Packs/day: 3.00    Years: 20.00    Pack years: 60.00    Types: Cigarettes    Last attempt to quit: 04/17/1968    Years since quitting: 49.3  . Smokeless tobacco: Never Used  Substance and Sexual Activity  . Alcohol use: No    Frequency: Never  . Drug use: No  . Sexual activity: Never  Lifestyle  . Physical activity:    Days per week: 0 days    Minutes per session: 0 min  . Stress: Only a little  Relationships  . Social connections:    Talks on phone: More than three times a week    Gets together: Three times a week    Attends religious service: Patient refused    Active member of club or organization: Patient refused    Attends meetings of clubs or organizations: Patient refused  Relationship status: Married  Other Topics Concern  . Not on file  Social History Narrative   Married.  Wife is Royal Piedra.   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   Former smoker, quit 20 years ago. Does not drink alcohol.   Does not get routine exercise, walks sometimes with a cane.   Caffeine use: diet-soda   Left handed    Past Surgical History:  Procedure Laterality Date  . CARDIAC CATHETERIZATION    . CATARACT EXTRACTION, BILATERAL    . CORONARY ARTERY BYPASS GRAFT  06/2002   LIMA-LAD, SVG-D1, SVG-OM  . EYE SURGERY    . KNEE SURGERY Left   . LEFT HEART CATHETERIZATION WITH CORONARY/GRAFT ANGIOGRAM N/A 10/13/2011   Procedure: LEFT HEART CATHETERIZATION WITH Isabel Caprice;  Surgeon: Marykay Lex, MD;   Location: [facility] CATH LAB;  Service: Cardiovascular;  Laterality: N/A;  . MINOR PLACEMENT OF FIDUCIAL N/A 04/26/2017   Procedure: Fiducial placement;  Surgeon: Maeola Harman, MD;  Location: [facility] OR;  Service: Neurosurgery;  Laterality: N/A;  Fiducial placement  . NM MYOCAR PERF WALL MOTION  03/23/2009   protocol:Bruce, normal perfusion in all regions, post-stress EF 72%, exercise capacity , EKG negative for ischemia.  . Shoulder Orthoscopic Surgery   06/2003  . SUBTHALAMIC STIMULATOR INSERTION Bilateral 05/04/2017   Procedure: Bilateral Deep brain stimulator placement;  Surgeon: Maeola Harman, MD;  Location: [facility] OR;  Service: Neurosurgery;  Laterality: Bilateral;  Bilateral deep brain stimulator placement  . TRANSTHORACIC ECHOCARDIOGRAM  04/2017   Jan 2019: Normal LV size and function.  EF 55-60%.  No or W MA.  Mild LA dilation.  . wrist ganglion cyst Left    Past Medical History:  Diagnosis Date  . Anxiety disorder   . Benign enlargement of prostate   . CAD in native artery 1997   Referred for CABG x3 in 2004 for LAD diagonal bifurcation lesion  . Cervical spinal stenosis   . Complex regional pain syndrome of right upper extremity    Right wrist; L arm  . Coronary atherosclerosis of artery bypass graft June 2013   Occluded SVG-D1; Cardiologist Dr. Herbie Baltimore  . Gait disorder   . Gastroesophageal reflux disease   . Glaucoma   . Hiatal hernia    GI: Dr Evette Cristal  . Hyperlipidemia LDL goal <70   . Hypertension   . Memory loss    Mild  . Obesity   . Peripheral neuropathy    possible peripheral neuropathy  . S/P CABG x 3 2004    LIMA-LAD, SVG to diagonal, SVG to OM  . Tremor, essential    On Primidone  . Unstable angina pectoris [facility])  June 2013   Cardiac cath: occluded SVG-DI. Patent LIMA-LAD and SVG-OM. EF 45% with apical inferior HK.   BP (!) 148/73   Pulse 70   Ht  (1.626 m)   Wt 197 lb (89.4 kg)   SpO2 92%   BMI 33.81 kg/m   Opioid Risk Score:   Fall Risk Score:   `1  Depression screen PHQ 2/9  Depression screen [facility] 2/9 08/17/2017 07/24/2017 06/14/2017 06/12/2017 03/06/2017 11/28/2016 10/05/2016  Decreased Interest 0 0 0 0 0 0 0  Down, Depressed, Hopeless 0 0 0 0 0 0 0  PHQ - 2 Score 0 0 0 0 0 0 0  Some recent data might be hidden     Review of Systems  Constitutional: Positive for diaphoresis.  HENT: Negative.   Eyes: Negative.   Respiratory: Negative.  Cardiovascular: Negative.   Gastrointestinal: Negative.   Endocrine: Negative.   Genitourinary: Negative.   Musculoskeletal: Positive for gait problem.  Skin: Negative.   Allergic/Immunologic: Negative.   Neurological: Positive for dizziness, tremors and weakness.  Hematological: Negative.   Psychiatric/Behavioral: Positive for confusion.  All other systems reviewed and are negative.      Objective:   Physical Exam General: NAD. Well-developed.  HEENT: Normocephalic, atraumatic. Cardio: RRR. No JVD. Resp: CTA Bilaterally. Normal effort   GI: BS positive and ND Musc/Skel:  No edema, no tenderness. Neuro: Alert and oriented Aphasia continues to improve Motor:  RUE: 4+/5 proximal to distal RLE: 4+/5 proximal to distal LUE tremor Dysarthria Skin:   Intact. Warm and dry    Assessment & Plan:  77 year old right-handed male with history of CAD and stenting as well as CABG, memory loss with bilateral deep brain stimulator placement presents for follow up for left frontal intraparenchymal hemorrhage, DBS, debility.  1. Decreased functional mobility secondary to left frontal intraparenchymal hemorrhage with recent deep brain stimulator for tremor.    Cont HEP  Cont follow up with Neurology, Neurosurg  2. Pain Management:    Mainly in Right leg at present  Will increase Neurontin 600 mg 3 times a day  Robaxin 500 BID PRN ordered  Cont tylenol as needed  3. Hypertension.   Elevated today  Cont to follow up with PCP  4. Tremors:  Cont meds  Cont to follow up for battery placement  for DBS ?in next few months  5. Sleep disturbance    Cont Melatonin   Will order Trazodone 25 qhs  6.  Neurogenic bladder  Cont Flomax   Cont follow up with Urology  Cont I/O caths

## 2017-08-20 ENCOUNTER — Other Ambulatory Visit: Payer: Self-pay

## 2017-08-21 DIAGNOSIS — I619 Nontraumatic intracerebral hemorrhage, unspecified: Secondary | ICD-10-CM | POA: Diagnosis not present

## 2017-08-21 DIAGNOSIS — G8110 Spastic hemiplegia affecting unspecified side: Secondary | ICD-10-CM | POA: Diagnosis not present

## 2017-08-21 DIAGNOSIS — R339 Retention of urine, unspecified: Secondary | ICD-10-CM | POA: Diagnosis not present

## 2017-08-21 NOTE — Patient Outreach (Signed)
Transition of care call on 08/20/2017  Placed call to patient and spoke with wife who reports that patient is doing better. Reports recent leg pain and was placed on a muscle relaxer and something to aid in sleep.   Wife reports clear urine at this time. Reports that patient has followed up with Dr. Allena Katz last week. Wife states decreased swelling to scrotum and a recent yeast infection that she is using a powder for..  Wife denies any new problems or concerns.  Plan:Reviewed with wife that patient has completed transition of care program and that I would follow up in 1 month via phone. Encouraged wife to call sooner if needed.  Rowe Pavy, RN, BSN, CEN [facility] [employer] [phone]

## 2017-08-29 ENCOUNTER — Other Ambulatory Visit: Payer: Self-pay

## 2017-08-29 ENCOUNTER — Ambulatory Visit (INDEPENDENT_AMBULATORY_CARE_PROVIDER_SITE_OTHER): Payer: PPO | Admitting: Family Medicine

## 2017-08-29 ENCOUNTER — Ambulatory Visit (HOSPITAL_COMMUNITY)
Admission: RE | Admit: 2017-08-29 | Discharge: 2017-08-29 | Disposition: A | Discharge status: Home / Self Care | Payer: PPO | Source: Ambulatory Visit | Attending: Family Medicine | Admitting: Family Medicine

## 2017-08-29 ENCOUNTER — Encounter: Payer: Self-pay | Admitting: Family Medicine

## 2017-08-29 VITALS — BP 136/70 | HR 70 | Temp 98.9°F | Ht 64.0 in | Wt 201.0 lb

## 2017-08-29 DIAGNOSIS — R600 Localized edema: Secondary | ICD-10-CM | POA: Diagnosis not present

## 2017-08-29 DIAGNOSIS — R05 Cough: Secondary | ICD-10-CM | POA: Insufficient documentation

## 2017-08-29 DIAGNOSIS — R059 Cough, unspecified: Secondary | ICD-10-CM

## 2017-08-29 DIAGNOSIS — D509 Iron deficiency anemia, unspecified: Secondary | ICD-10-CM | POA: Diagnosis not present

## 2017-08-29 DIAGNOSIS — J929 Pleural plaque without asbestos: Secondary | ICD-10-CM | POA: Diagnosis not present

## 2017-08-29 DIAGNOSIS — R0602 Shortness of breath: Secondary | ICD-10-CM | POA: Diagnosis not present

## 2017-08-29 MED ORDER — BENZONATATE 200 MG PO CAPS: 200.0000 mg | ORAL_CAPSULE | Freq: Two times a day (BID) | ORAL | 0 refills | Status: DC | PRN

## 2017-08-29 MED ORDER — ALBUTEROL SULFATE 108 (90 BASE) MCG/ACT IN AEPB: 2.0000 | INHALATION_SPRAY | Freq: Four times a day (QID) | RESPIRATORY_TRACT | 1 refills | Status: DC | PRN

## 2017-08-29 MED ORDER — IPRATROPIUM BROMIDE 0.02 % IN SOLN
0.5000 mg | Freq: Once | RESPIRATORY_TRACT | Status: AC
  Administered 2017-08-29: 0.5 mg via RESPIRATORY_TRACT

## 2017-08-29 MED ORDER — IPRATROPIUM-ALBUTEROL 0.5-2.5 (3) MG/3ML IN SOLN: 3.0000 mL | Freq: Once | RESPIRATORY_TRACT | Status: DC

## 2017-08-29 MED ORDER — ALBUTEROL SULFATE (2.5 MG/3ML) 0.083% IN NEBU
2.5000 mg | INHALATION_SOLUTION | Freq: Once | RESPIRATORY_TRACT | Status: AC
  Administered 2017-08-29: 2.5 mg via RESPIRATORY_TRACT

## 2017-08-29 NOTE — Progress Notes (Signed)
Subjective: Chief Complaint  Patient presents with  . URI     HPI: Brendan [name] is a 77 y.o. presenting to clinic today to discuss the following:  Cough Patient is being seen today due to productive cough with clear sputum. It has been getting progressively worse and on 5/11 and EMS was called due to SOB and inability to stop coughing. EMS advised going to the hospital via ambulance but patient refused. He has associated rhinorrhea, congestion, and SOB today.   He denies fever, nausea, vomiting, diarrhea, constipation, hemoptosis. No blood in the stool, no weight loss or gain, and appetite still normal.  Health Maintenance: None today     ROS noted in HPI.   Past Medical, Surgical, Social, and Family History Reviewed & Updated per EMR.   Pertinent Historical Findings include:   Social History   Tobacco Use  Smoking Status Former Smoker  . Packs/day: 3.00  . Years: 20.00  . Pack years: 60.00  . Types: Cigarettes  . Last attempt to quit: 04/17/1968  . Years since quitting: 49.4  Smokeless Tobacco Never Used    Objective: BP 136/70   Pulse 70   Temp 98.9 F (37.2 C) (Oral)   Ht  (1.626 m)   Wt 201 lb (91.2 kg)   SpO2 (!) 88%   BMI 34.50 kg/m  Vitals and nursing notes reviewed  Physical Exam  Constitutional: He is oriented to person, place, and time. He appears well-developed and well-nourished. No distress.  HENT:  Head: Normocephalic and atraumatic.  Right Ear: External ear normal.  Left Ear: External ear normal.  Mouth/Throat: Oropharynx is clear and moist. No oropharyngeal exudate.  Boggy swollen turbinates  Eyes: Pupils are equal, round, and reactive to light. Conjunctivae and EOM are normal.  Neck: Normal range of motion. Neck supple.  Cardiovascular: Normal rate, regular rhythm, normal heart sounds and intact distal pulses.  No murmur heard. Pulmonary/Chest: Effort normal and breath sounds normal. No respiratory distress. He has no wheezes.  He has no rales. He exhibits no tenderness.  Abdominal: Soft. Bowel sounds are normal. He exhibits no distension. There is no tenderness. There is no guarding.  Musculoskeletal: Normal range of motion. He exhibits no edema.  Lymphadenopathy:    He has no cervical adenopathy.  Neurological: He is alert and oriented to person, place, and time.  Skin: Skin is warm. Capillary refill takes less than 2 seconds. No rash noted. No erythema.   No results found for this or any previous visit (from the past 72 hour(s)).  Assessment/Plan:  Cough Many possible etiologies for cough Could be CAP vs COPD exacerbation vs CHF vs viral URI  Viral URI is most likely. However, will obtain CBC and CXR to rule out CAP. CBC will also be helpful to rule out any anemia as patient has a history of it and is having significant SOB.  Could be CHF so CXR will be helpful to see if heart is enlarged. CHF is unlikely however as he has no history.  COPD exacerbation is another possibility but he has no history of COPD.  Patient does have asthma and he may be having increased asthma exacerbation brought on by viral URI. Will give Tessalon pearls and refilled Albuterol and Atrovent nebulizer.    PATIENT EDUCATION PROVIDED: See AVS    Diagnosis and plan along with any newly prescribed medication(s) were discussed in detail with this patient today. The patient verbalized understanding and agreed with the plan.  Patient advised if symptoms worsen return to clinic or ER.   Health Maintainance:   Orders Placed This Encounter  Procedures  . DG Chest 2 View    Standing Status:   Future    Number of Occurrences:   1    Standing Expiration Date:   11/29/2017    Order Specific Question:   Reason for Exam (SYMPTOM  OR DIAGNOSIS REQUIRED)    Answer:   shortness of breath, cough    Order Specific Question:   Preferred imaging location?    Answer:   [facility]    Order Specific Question:   Call Results- Best  Contact Number?    Answer:   SOB cough    Order Specific Question:   Radiology Contrast Protocol - do NOT remove file path    Answer:   [email or web address]  . CBC with Differential  . Basic Metabolic Panel    Meds ordered this encounter  Medications  . DISCONTD: ipratropium-albuterol (DUONEB) 0.5-2.5 (3) MG/3ML nebulizer solution 3 mL  . Albuterol Sulfate (PROAIR RESPICLICK) 108 (90 Base) MCG/ACT AEPB    Sig: Inhale 2 Inhalers into the lungs 4 (four) times daily as needed.    Dispense:  1 each    Refill:  1  . benzonatate (TESSALON) 200 MG capsule    Sig: Take 1 capsule (200 mg total) by mouth 2 (two) times daily as needed for cough.    Dispense:  20 capsule    Refill:  0  . albuterol (PROVENTIL) (2.5 MG/3ML) 0.083% nebulizer solution 2.5 mg  . ipratropium (ATROVENT) nebulizer solution 0.5 mg     Jules Schick, DO 08/29/2017, 3:00 PM PGY-1, [facility]

## 2017-08-29 NOTE — Patient Instructions (Signed)
It was great to meet you today! Thank you for letting me participate in your care!  Today, we discussed your recent cough and shortness of breath. It could be due to several things. It could be a combination of an acute infection (viral vs bacterial). It could also be related to your heart or your anemia or a combination of those things.   I will call you with lab results if anything is abnormal. We will see you back in the office in one week.  Be well, Jules Schick, DO PGY-1, [facility]

## 2017-08-30 ENCOUNTER — Encounter: Payer: Self-pay | Admitting: Family Medicine

## 2017-08-30 LAB — CBC WITH DIFFERENTIAL/PLATELET
Basophils Absolute: 0 10*3/uL (ref 0.0–0.2)
Basos: 0 %
EOS (ABSOLUTE): 0.2 10*3/uL (ref 0.0–0.4)
Eos: 3 %
Hematocrit: 34.7 % — ABNORMAL LOW (ref 37.5–51.0)
Hemoglobin: 10.9 g/dL — ABNORMAL LOW (ref 13.0–17.7)
Immature Grans (Abs): 0 10*3/uL (ref 0.0–0.1)
Immature Granulocytes: 0 %
Lymphocytes Absolute: 1.4 10*3/uL (ref 0.7–3.1)
Lymphs: 31 %
MCH: 29.8 pg (ref 26.6–33.0)
MCHC: 31.4 g/dL — ABNORMAL LOW (ref 31.5–35.7)
MCV: 95 fL (ref 79–97)
Monocytes Absolute: 0.6 10*3/uL (ref 0.1–0.9)
Monocytes: 12 %
Neutrophils Absolute: 2.4 10*3/uL (ref 1.4–7.0)
Neutrophils: 54 %
Platelets: 128 10*3/uL — ABNORMAL LOW (ref 150–379)
RBC: 3.66 x10E6/uL — ABNORMAL LOW (ref 4.14–5.80)
RDW: 14.6 % (ref 12.3–15.4)
WBC: 4.5 10*3/uL (ref 3.4–10.8)

## 2017-08-30 LAB — BASIC METABOLIC PANEL
BUN/Creatinine Ratio: 11 (ref 10–24)
BUN: 12 mg/dL (ref 8–27)
CO2: 24 mmol/L (ref 20–29)
Calcium: 9.1 mg/dL (ref 8.6–10.2)
Chloride: 97 mmol/L (ref 96–106)
Creatinine, Ser: 1.13 mg/dL (ref 0.76–1.27)
GFR calc Af Amer: 74 mL/min/{1.73_m2} (ref >59)
GFR calc non Af Amer: 64 mL/min/{1.73_m2} (ref >59)
Glucose: 111 mg/dL — ABNORMAL HIGH (ref 65–99)
Potassium: 4.9 mmol/L (ref 3.5–5.2)
Sodium: 138 mmol/L (ref 134–144)

## 2017-08-30 NOTE — Progress Notes (Signed)
Called patient and left message about results. Will mail letter or lab results.

## 2017-08-30 NOTE — Progress Notes (Signed)
Sending lab results and CXR results to patient. Called patient and left voicemail.

## 2017-08-31 ENCOUNTER — Telehealth: Payer: Self-pay

## 2017-08-31 NOTE — Telephone Encounter (Signed)
Brendan [name]-ST from [facility] called asking for extension on ST 1wk4. Verbal order given for extension.

## 2017-09-04 ENCOUNTER — Telehealth: Payer: Self-pay

## 2017-09-04 DIAGNOSIS — G8110 Spastic hemiplegia affecting unspecified side: Secondary | ICD-10-CM | POA: Diagnosis not present

## 2017-09-04 DIAGNOSIS — I619 Nontraumatic intracerebral hemorrhage, unspecified: Secondary | ICD-10-CM | POA: Diagnosis not present

## 2017-09-04 NOTE — Telephone Encounter (Signed)
Cari- RN [facility] calling regarding patient. Pt was recently seen for URI. RN states patient is still coughing, bilateral wheezes, cough is productive- yellow green. RN would like to know if he should come back in to be seen or if MD wants to call in prednisone for pt. Pt has appt with pulmonology next month. Call back number [phone] Shawna Orleans, RN

## 2017-09-04 NOTE — Telephone Encounter (Signed)
lmovm of Lyla Son for a return call if she feels he needs to be seen before his scheduled appt for 09/11/17.  I attempted to call patient, but no answer. Shateka Petrea, Maryjo Rochester, CMA

## 2017-09-04 NOTE — Telephone Encounter (Signed)
Brendan [name] should be seen in our office whenever they can make an appointment so that we can evaluate whether other medications would be appropriate for him.  Thanks!

## 2017-09-05 ENCOUNTER — Ambulatory Visit: Payer: PPO | Admitting: Acute Care

## 2017-09-05 ENCOUNTER — Encounter: Payer: Self-pay | Admitting: Acute Care

## 2017-09-05 ENCOUNTER — Telehealth: Payer: Self-pay

## 2017-09-05 VITALS — BP 124/68 | HR 62 | Temp 97.2°F | Ht 66.0 in | Wt 201.0 lb

## 2017-09-05 DIAGNOSIS — R059 Cough, unspecified: Secondary | ICD-10-CM

## 2017-09-05 DIAGNOSIS — R05 Cough: Secondary | ICD-10-CM | POA: Diagnosis not present

## 2017-09-05 DIAGNOSIS — J929 Pleural plaque without asbestos: Secondary | ICD-10-CM | POA: Diagnosis not present

## 2017-09-05 MED ORDER — PREDNISONE 10 MG PO TABS: ORAL_TABLET | ORAL | 0 refills | Status: DC

## 2017-09-05 MED ORDER — HYDROCODONE-HOMATROPINE 5-1.5 MG/5ML PO SYRP: 5.0000 mL | ORAL_SOLUTION | Freq: Four times a day (QID) | ORAL | 0 refills | Status: DC | PRN

## 2017-09-05 NOTE — Patient Instructions (Addendum)
It is nice to meet you today. We will add Mucinex 1200 mg once daily with a full glass of water to help with secretions. Prednisone taper; 10 mg tablets: 4 tabs x 2 days, 3 tabs x 2 days, 2 tabs x 2 days 1 tab x 2 days then stop. Delsym cough syrup 5 cc's in the morning. Hydromet cough syrup 5 cc's at bedtime for cough. Do not drive if sleepy. Continue the Benzonatate caplets during the day as prescribed. Sips of water instead of throat clearing Throat soothing with sugar free Archie Balboa ranchers or [employer]. Follow up with Maralyn Sago NP or Dr. Delton Coombes  In 2 weeks or sooner if needed. Please contact office for sooner follow up if symptoms do not improve or worsen or seek emergency care

## 2017-09-05 NOTE — Progress Notes (Signed)
History of Present Illness Brendan [name] is a 77 y.o. male former heavy smoker with asbestos exposure with pleural plaques on CT scan of his chest. History of coronary artery disease and CABG. He is followed by Dr. Delton Coombes.   09/05/2017 Acute OV: Pt. Presents for acute visit. He states he has had worsening of his coughing and breathing. He is no longer taking Lisinopril and he is compliant with his protonix. He was last seen by Rubye Oaks, NP 07/2016. He had a cough flare at that time and was treated with PPI and antihistamine for PND. He returns today for follow up. He states his cough has worsened over the last  Week. Per his wife he was coughing up clear secretions. He was having a hard time getting secretions up. He would have coughing fits and he said he couldn't breath. No fever. He saw his family doctor last week. He was given a breathing treatment by his PCP. He stated that this did help.Rescue Inhaler was changed from powder to [facility]. To HFA.  CXR was clear 08/29/2017. He has a follow up appointment next week. He states his cough makes him breathless at times.Marland KitchenHe feels his cough may be worse after he eats.He is compliant with his omeprazole. He states he feels his secretions get caught in his throat.He states he does not get choked on his food or on liquids.He denies fever, chest pain, orthopnea or hemoptysis.   Test Results: FENO 09/05/2017>>24 ppb   CXR 09/05/2017>> FINDINGS:>> personally reviewed by me Normal heart size. Status post CABG. Mildly low lung volumes with interstitial crowding. Blunting of the lateral left costophrenic sulcus and left posterior pleural thickening attributed to scarring/pleural plaques. There is also hazy opacity at the right apex from pleural plaques by 2018 chest CT. There is no edema, consolidation, effusion, or pneumothorax.  IMPRESSION: 1. No evidence of active disease. 2. Pleural plaques.  CT chest 07/14/16 >showed stable areas of pleural  plaques. Mild scarring and atelectasis in the L  base without significant change.   CBC Latest Ref Rng & Units 08/29/2017 08/01/2017 07/19/2017  WBC 3.4 - 10.8 x10E3/uL 4.5 4.4 6.3  Hemoglobin 13.0 - 17.7 g/dL 10.9(L) 10.1(L) 9.6(L)  Hematocrit 37.5 - 51.0 % 34.7(L) 32.5(L) 30.9(L)  Platelets 150 - 379 x10E3/uL 128(L) 152 184    BMP Latest Ref Rng & Units 08/29/2017 08/01/2017 07/19/2017  Glucose 65 - 99 mg/dL 161(W) 960(A) 540(J)  BUN 8 - 27 mg/dL Creatinine 0.76 - 1.27 mg/dL 8.11 9.14(N) 8.29  BUN/Creat Ratio 10 - -  Sodium 134 - 144 mmol/L 138 144 137  Potassium 3.5 - 5.2 mmol/L 4.9 4.0 3.6  Chloride 96 - 106 mmol/L 97 103 105  CO2 20 - 29 mmol/L Calcium 8.6 - 10.2 mg/dL 9.1 9.0 5.6(O)    BNP No results found for: BNP  ProBNP    Component Value Date/Time   PROBNP 252.8 (H) 06/26/2012 1628    PFT No results found for: FEV1PRE, FEV1POST, FVCPRE, FVCPOST, TLC, DLCOUNC, PREFEV1FVCRT, PSTFEV1FVCRT  Dg Chest 2 View  Result Date: 08/30/2017 CLINICAL DATA:  Shortness of breath and cough EXAM: CHEST - 2 VIEW COMPARISON:  07/16/2017 FINDINGS: Normal heart size. Status post CABG. Mildly low lung volumes with interstitial crowding. Blunting of the lateral left costophrenic sulcus and left posterior pleural thickening attributed to scarring/pleural plaques. There is also hazy opacity at the right apex from pleural plaques by 2018 chest CT.  There is no edema, consolidation, effusion, or pneumothorax. IMPRESSION: 1. No evidence of active disease. 2. Pleural plaques. Electronically Signed   By: Marnee Spring M.D.   On: 08/30/2017 09:05     Past medical hx Past Medical History:  Diagnosis Date  . Anxiety disorder   . Benign enlargement of prostate   . CAD in native artery 1997   Referred for CABG x3 in 2004 for LAD diagonal bifurcation lesion  . Cervical spinal stenosis   . Complex regional pain syndrome of right upper extremity    Right wrist; L arm  .  Coronary atherosclerosis of artery bypass graft June 2013   Occluded SVG-D1; Cardiologist Dr. Herbie Baltimore  . Gait disorder   . Gastroesophageal reflux disease   . Glaucoma   . Hiatal hernia    GI: Dr Evette Cristal  . Hyperlipidemia LDL goal <70   . Hypertension   . Memory loss    Mild  . Obesity   . Peripheral neuropathy    possible peripheral neuropathy  . S/P CABG x 3 2004    LIMA-LAD, SVG to diagonal, SVG to OM  . Tremor, essential    On Primidone  . Unstable angina pectoris [facility])  June 2013   Cardiac cath: occluded SVG-DI. Patent LIMA-LAD and SVG-OM. EF 45% with apical inferior HK.     Social History   Tobacco Use  . Smoking status: Former Smoker    Packs/day: 3.00    Years: 20.00    Pack years: 60.00    Types: Cigarettes    Last attempt to quit: 04/17/1968    Years since quitting: 49.4  . Smokeless tobacco: Never Used  Substance Use Topics  . Alcohol use: No    Frequency: Never  . Drug use: No    Mr.Woldt reports that he quit smoking about 49 years ago. His smoking use included cigarettes. He has a 60.00 pack-year smoking history. He has never used smokeless tobacco. He reports that he does not drink alcohol or use drugs.  Tobacco Cessation: Former smoker, Quit in 1970  Past surgical hx, Family hx, Social hx all reviewed.  Current Outpatient Medications on File Prior to Visit  Medication Sig  . ALLERGY 10 MG tablet TAKE 1 TABLET BY MOUTH DAILY  . aspirin 81 MG chewable tablet Chew 1 tablet (81 mg total) by mouth daily.  [name] atorvastatin (LIPITOR) 40 MG tablet Take 1 tablet (40 mg total) by mouth daily.  . baclofen (LIORESAL) 10 MG tablet Take 10 mg by mouth every evening.  . Baclofen 5 MG TABS Take 1 tablet by mouth every morning.  . benzonatate (TESSALON) 200 MG capsule Take 1 capsule (200 mg total) by mouth 2 (two) times daily as needed for cough.  . furosemide (LASIX) 20 MG tablet Take 20 mg by mouth daily.  [name] gabapentin (NEURONTIN) 600 MG tablet Take 1 tablet (600 mg  total) by mouth 3 (three) times daily.  [name] ketoconazole (NIZORAL) 2 % shampoo Apply 1 application topically 2 (two) times a week. (Patient taking differently: Apply 1 application topically once a week. )  . losartan (COZAAR) 50 MG tablet Take 1 tablet (50 mg total) by mouth daily.  . Melatonin 3 MG TABS Take 1 tablet (3 mg total) by mouth at bedtime.  . methocarbamol (ROBAXIN) 500 MG tablet Take 1 tablet (500 mg total) by mouth 2 (two) times daily as needed for muscle spasms.  . nitroGLYCERIN (NITROSTAT) 0.4 MG SL tablet Place 1 tablet (0.4 mg total)  under the tongue every 5 (five) minutes as needed for chest pain. call 911 if chest pain not better  . omeprazole (PRILOSEC) 20 MG capsule Take 1 capsule (20 mg total) by mouth daily as needed (acid reflux).  . primidone (MYSOLINE) 50 MG tablet Take 1 tablet (50 mg total) by mouth 2 (two) times daily.  [name] PROAIR HFA 108 (90 Base) MCG/ACT inhaler Take 2 puffs by mouth every 6 (six) hours as needed.  . propranolol (INDERAL) 40 MG tablet Take 1 tablet (40 mg total) by mouth 2 (two) times daily.  [name] Propylene Glycol (SYSTANE BALANCE) 0.6 % SOLN Place 1 drop into both eyes daily as needed (for dry eyes).  . tamsulosin (FLOMAX) 0.4 MG CAPS capsule Take 1 capsule (0.4 mg total) by mouth daily after supper.  . traZODone (DESYREL) 50 MG tablet Take 0.5 tablets (25 mg total) by mouth at bedtime.   Current Facility-Administered Medications on File Prior to Visit  Medication  . 0.9 %  sodium chloride infusion  . sodium chloride 0.9 % injection 3 mL     Allergies  Allergen Reactions  . Lisinopril Cough    Review Of Systems:  Constitutional:   No  weight loss, night sweats,  Fevers, chills, +fatigue, or  lassitude.  HEENT:   No headaches,  Difficulty swallowing,  Tooth/dental problems, or  Sore throat,                No sneezing, itching, ear ache, nasal congestion,+ post nasal drip,   CV:  No chest pain,  Orthopnea, PND, swelling in lower extremities,  anasarca, dizziness, palpitations, syncope.   GI  No heartburn, indigestion, abdominal pain, nausea, vomiting, diarrhea, change in bowel habits, loss of appetite, bloody stools.   Resp: No shortness of breath with exertion or at rest.  No excess mucus, no productive cough,  + non-productive cough,  No coughing up of blood.  No change in color of mucus.  + wheezing.  No chest wall deformity  Skin: no rash or lesions.  GU: no dysuria, change in color of urine, no urgency or frequency.  No flank pain, no hematuria   MS:  No joint pain or swelling.  No decreased range of motion.  No back pain.  Psych:  No change in mood or affect. No depression or anxiety.  No memory loss.   Vital Signs BP 124/68 (BP Location: Left Arm, Cuff Size: Normal)   Pulse 62   Temp (!) 97.2 F (36.2 C) (Oral)   Ht  (1.676 m)   Wt 201 lb (91.2 kg)   SpO2 95%   BMI 32.44 kg/m    Physical Exam:  General- No distress,  A&Ox3, pleasant elderly male with tremor ENT: No sinus tenderness, TM clear, pale nasal mucosa, no oral exudate+ post nasal drip, no LAN Cardiac: S1, S2, regular rate and rhythm, no murmur Chest:  + Faint  wheeze/ No rales/ dullness; no accessory muscle use, no nasal flaring, no sternal retractions Abd.: Soft Non-tender, ND, Obese Ext: No clubbing cyanosis, edema Neuro:  Very deconditioned, essential tremor, MAE x 4, A&O x 3 Skin: No rashes, warm and dry Psych: normal mood and behavior   Assessment/Plan  Pleural plaque Flare with cough Pt is unaware of trigger Plan: We will add Mucinex 1200 mg once daily with a full glass of water to help with secretions. Prednisone taper; 10 mg tablets: 4 tabs x 2 days, 3 tabs x 2 days, 2 tabs x 2 days  1 tab x 2 days then stop. Delsym cough syrup 5 cc's in the morning. Hydromet cough syrup 5 cc's at bedtime for cough. Do not drive if sleepy. Continue the Benzonatate caplets during the day as prescribed. Sips of water instead of throat  clearing Throat soothing with sugar free [employer] or [employer]. Follow up with Maralyn Sago NP or Dr. Delton Coombes  In 2 weeks or sooner if needed. Please contact office for sooner follow up if symptoms do not improve or worsen or seek emergency care       Bevelyn Ngo, NP 09/05/2017  10:53 PM

## 2017-09-05 NOTE — Assessment & Plan Note (Signed)
Flare with cough Pt is unaware of trigger Plan: We will add Mucinex 1200 mg once daily with a full glass of water to help with secretions. Prednisone taper; 10 mg tablets: 4 tabs x 2 days, 3 tabs x 2 days, 2 tabs x 2 days 1 tab x 2 days then stop. Delsym cough syrup 5 cc's in the morning. Hydromet cough syrup 5 cc's at bedtime for cough. Do not drive if sleepy. Continue the Benzonatate caplets during the day as prescribed. Sips of water instead of throat clearing Throat soothing with sugar free [employer] or [employer]. Follow up with Maralyn Sago NP or Dr. Delton Coombes  In 2 weeks or sooner if needed. Please contact office for sooner follow up if symptoms do not improve or worsen or seek emergency care

## 2017-09-05 NOTE — Telephone Encounter (Signed)
Lyla Son, RN with [facility], left message that she will tell patient to make appointment. She also needs verbal orders to extend [facility] visits 1x/week x 4 more weeks.  Call back is [phone].  Ples Specter, RN [facility] RN)

## 2017-09-05 NOTE — Assessment & Plan Note (Signed)
Many possible etiologies for cough Could be CAP vs COPD exacerbation vs CHF vs viral URI  Viral URI is most likely. However, will obtain CBC and CXR to rule out CAP. CBC will also be helpful to rule out any anemia as patient has a history of it and is having significant SOB.  Could be CHF so CXR will be helpful to see if heart is enlarged. CHF is unlikely however as he has no history.  COPD exacerbation is another possibility but he has no history of COPD.  Patient does have asthma and he may be having increased asthma exacerbation brought on by viral URI. Will give Tessalon pearls and refilled Albuterol and Atrovent nebulizer.

## 2017-09-06 ENCOUNTER — Ambulatory Visit: Payer: PPO | Admitting: Internal Medicine

## 2017-09-06 NOTE — Telephone Encounter (Signed)
Attempted to call Brendan [name] with [facility] to give verbal orders, but there was no answer and VM was full.  Will try again later.

## 2017-09-07 ENCOUNTER — Telehealth: Payer: Self-pay | Admitting: Acute Care

## 2017-09-07 NOTE — Telephone Encounter (Signed)
Attempted to call to give verbal orders again on 5/24 with no answer and full VM.

## 2017-09-07 NOTE — Telephone Encounter (Addendum)
Brendan [name] with [facility] calling to report interaction with hydrocodone cough syrup and gabapentin, baclofen and methocarbamol  May cause sedation  Per TP- tell pt to hold baclofen and methocarbamol while on cough syrup  Gabapentin should be fine, but warn pt may cause sleepiness  I called and spoke with Brendan [name] and notified her of recs and she verbalized understanding and will inform the pt  Nothing further needed

## 2017-09-07 NOTE — Telephone Encounter (Signed)
Got in touch with Lyla Son, verbal orders given.

## 2017-09-07 NOTE — Telephone Encounter (Signed)
Lyla Son- RN with [facility] called again requesting verbal orders for extension of visits. Left same call back as before [phone] Shawna Orleans, RN

## 2017-09-11 ENCOUNTER — Ambulatory Visit: Payer: PPO | Admitting: Family Medicine

## 2017-09-12 ENCOUNTER — Other Ambulatory Visit: Payer: Self-pay | Admitting: Family Medicine

## 2017-09-17 DIAGNOSIS — N401 Enlarged prostate with lower urinary tract symptoms: Secondary | ICD-10-CM | POA: Diagnosis not present

## 2017-09-17 DIAGNOSIS — R3912 Poor urinary stream: Secondary | ICD-10-CM | POA: Diagnosis not present

## 2017-09-17 DIAGNOSIS — N453 Epididymo-orchitis: Secondary | ICD-10-CM | POA: Diagnosis not present

## 2017-09-19 ENCOUNTER — Encounter: Payer: Self-pay | Admitting: Acute Care

## 2017-09-19 ENCOUNTER — Ambulatory Visit: Payer: PPO | Admitting: Acute Care

## 2017-09-19 DIAGNOSIS — D649 Anemia, unspecified: Secondary | ICD-10-CM | POA: Diagnosis not present

## 2017-09-19 DIAGNOSIS — R05 Cough: Secondary | ICD-10-CM | POA: Diagnosis not present

## 2017-09-19 DIAGNOSIS — R059 Cough, unspecified: Secondary | ICD-10-CM

## 2017-09-19 MED ORDER — BENZONATATE 200 MG PO CAPS: 200.0000 mg | ORAL_CAPSULE | Freq: Three times a day (TID) | ORAL | 1 refills | Status: DC | PRN

## 2017-09-19 NOTE — Patient Instructions (Addendum)
It is good to see you today. Please continue taking Claritin every day. We will renew your Tessalon 200 mg Take twice daily as needed for cough Use Pro Air as needed for breakthrough shortness of breath  Follow up with Dr. Delton CoombesByrum for yearly check up 10/11/2017 at 1:30. Please contact office for sooner follow up if symptoms do not improve or worsen or seek emergency care

## 2017-09-19 NOTE — Assessment & Plan Note (Signed)
Monitor for decrease in baseline HGB Check CBC prn with increased dyspnea

## 2017-09-19 NOTE — Progress Notes (Signed)
History of Present Illness Brendan [name] is a 77 y.o. male former heavy smoker with asbestos exposure with pleural plaques on CT scan of his chest. History of coronary artery disease and CABG. He is followed by Dr. Delton CoombesByrum.   09/19/2017  Pt was seen 09/05/2017 with cough/ flare. Pt. Was unaware of his trigger. Plan after visit was as follows: Mucinex, prednisone taper, Delsym cough syrup during the day, Hydromet at night, Continue Benzonatate caplets, sips of water instead of throat clearing, throat soothing with sugar free hard candy, and close interval follow up. Pt. States he has been compliant with the above plan. He states he is  better . His cough has improved. He states he is not coughing . His wife states he is much better after his treatment with prednisone.She is asking for mild sedative for him. She states he has anxiety.He has never been on medication for anxiety. I  I have referred him to his PCP for for follow up and possible treatment..He has not had to use his rescue inhaler at all.He is compliant with his Prilosec. He has finished his Tessalon, but would like some for use if his cough recurs.. He denies fever, chest pain , orthopnea or hemoptysis.     Test Results: FENO 09/05/2017>>24 ppb  CXR 09/05/2017>> FINDINGS:>>  Normal heart size. Status post CABG. Mildly low lung volumes with interstitial crowding. Blunting of the lateral left costophrenic sulcus and left posterior pleural thickening attributed to scarring/pleural plaques. There is also hazy opacity at the right apex from pleural plaques by 2018 chest CT. There is no edema, consolidation, effusion, or pneumothorax.  IMPRESSION: 1. No evidence of active disease. 2. Pleural plaques.  CT chest 07/14/16 >showed stable areas of pleural plaques. Mild scarring and atelectasis in theLbase without significant change.   CBC Latest Ref Rng & Units 08/29/2017 08/01/2017 07/19/2017  WBC 3.4 - 10.8 x10E3/uL 4.5 4.4 6.3    Hemoglobin 13.0 - 17.7 g/dL 10.9(L) 10.1(L) 9.6(L)  Hematocrit 37.5 - 51.0 % 34.7(L) 32.5(L) 30.9(L)  Platelets 150 - 379 x10E3/uL 128(L) 152 184    BMP Latest Ref Rng & Units 08/29/2017 08/01/2017 07/19/2017  Glucose 65 - 99 mg/dL 099(I111(H) 338(S152(H) 505(L129(H)  BUN 8 - 27 mg/dL 12 10 17   Creatinine 0.76 - 1.27 mg/dL 9.761.13 7.34(L0.69(L) 9.371.15  BUN/Creat Ratio 10 - 24 11 14  -  Sodium 134 - 144 mmol/L 138 144 137  Potassium 3.5 - 5.2 mmol/L 4.9 4.0 3.6  Chloride 96 - 106 mmol/L 97 103 105  CO2 20 - 29 mmol/L 24 29 24   Calcium 8.6 - 10.2 mg/dL 9.1 9.0 9.0(W8.3(L)    BNP No results found for: BNP  ProBNP    Component Value Date/Time   PROBNP 252.8 (H) 06/26/2012 1628    PFT No results found for: FEV1PRE, FEV1POST, FVCPRE, FVCPOST, TLC, DLCOUNC, PREFEV1FVCRT, PSTFEV1FVCRT  Dg Chest 2 View  Result Date: 08/30/2017 CLINICAL DATA:  Shortness of breath and cough EXAM: CHEST - 2 VIEW COMPARISON:  07/16/2017 FINDINGS: Normal heart size. Status post CABG. Mildly low lung volumes with interstitial crowding. Blunting of the lateral left costophrenic sulcus and left posterior pleural thickening attributed to scarring/pleural plaques. There is also hazy opacity at the right apex from pleural plaques by 2018 chest CT. There is no edema, consolidation, effusion, or pneumothorax. IMPRESSION: 1. No evidence of active disease. 2. Pleural plaques. Electronically Signed   By: Marnee SpringJonathon  Watts M.D.   On: 08/30/2017 09:05     Past medical  hx Past Medical History:  Diagnosis Date  . Anxiety disorder   . Benign enlargement of prostate   . CAD in native artery 1997   Referred for CABG x3 in 2004 for LAD diagonal bifurcation lesion  . Cervical spinal stenosis   . Complex regional pain syndrome of right upper extremity    Right wrist; L arm  . Coronary atherosclerosis of artery bypass graft June 2013   Occluded SVG-D1; Cardiologist Dr. Herbie Baltimore  . Gait disorder   . Gastroesophageal reflux disease   . Glaucoma   . Hiatal  hernia    GI: Dr Evette Cristal  . Hyperlipidemia LDL goal <70   . Hypertension   . Memory loss    Mild  . Obesity   . Peripheral neuropathy    possible peripheral neuropathy  . S/P CABG x 3 2004    LIMA-LAD, SVG to diagonal, SVG to OM  . Tremor, essential    On Primidone  . Unstable angina pectoris [facility])  June 2013   Cardiac cath: occluded SVG-DI. Patent LIMA-LAD and SVG-OM. EF 45% with apical inferior HK.     Social History   Tobacco Use  . Smoking status: Former Smoker    Packs/day: 3.00    Years: 20.00    Pack years: 60.00    Types: Cigarettes    Last attempt to quit: 04/17/1968    Years since quitting: 49.4  . Smokeless tobacco: Never Used  Substance Use Topics  . Alcohol use: No    Frequency: Never  . Drug use: No    Brendan [name] reports that he quit smoking about 49 years ago. His smoking use included cigarettes. He has a 60.00 pack-year smoking history. He has never used smokeless tobacco. He reports that he does not drink alcohol or use drugs.  Tobacco Cessation: Former smoker with a 60 pack year smoking history quit 1970  Past surgical hx, Family hx, Social hx all reviewed.  Current Outpatient Medications on File Prior to Visit  Medication Sig  . ALLERGY 10 MG tablet TAKE 1 TABLET BY MOUTH DAILY  . aspirin 81 MG chewable tablet Chew 1 tablet (81 mg total) by mouth daily.  [name] atorvastatin (LIPITOR) 40 MG tablet Take 1 tablet (40 mg total) by mouth daily.  . baclofen (LIORESAL) 10 MG tablet Take 10 mg by mouth every evening.  . Baclofen 5 MG TABS Take 1 tablet by mouth every morning.  . furosemide (LASIX) 20 MG tablet Take 20 mg by mouth daily.  [name] HYDROcodone-homatropine (HYDROMET) 5-1.5 MG/5ML syrup Take 5 mLs by mouth every 6 (six) hours as needed for cough.  [name] ketoconazole (NIZORAL) 2 % shampoo Apply 1 application topically 2 (two) times a week. (Patient taking differently: Apply 1 application topically once a week. )  . losartan (COZAAR) 50 MG tablet Take 1 tablet (50  mg total) by mouth daily.  . Melatonin 3 MG TABS Take 1 tablet (3 mg total) by mouth at bedtime.  . methocarbamol (ROBAXIN) 500 MG tablet Take 1 tablet (500 mg total) by mouth 2 (two) times daily as needed for muscle spasms.  . nitroGLYCERIN (NITROSTAT) 0.4 MG SL tablet Place 1 tablet (0.4 mg total) under the tongue every 5 (five) minutes as needed for chest pain. call 911 if chest pain not better  . omeprazole (PRILOSEC) 20 MG capsule Take 1 capsule (20 mg total) by mouth daily as needed (acid reflux).  . primidone (MYSOLINE) 50 MG tablet Take 1 tablet (50 mg total) by mouth  2 (two) times daily.  [name] PROAIR HFA 108 (90 Base) MCG/ACT inhaler Take 2 puffs by mouth every 6 (six) hours as needed.  . propranolol (INDERAL) 40 MG tablet Take 1 tablet (40 mg total) by mouth 2 (two) times daily.  [name] Propylene Glycol (SYSTANE BALANCE) 0.6 % SOLN Place 1 drop into both eyes daily as needed (for dry eyes).  . tamsulosin (FLOMAX) 0.4 MG CAPS capsule Take 1 capsule (0.4 mg total) by mouth daily after supper.  . traZODone (DESYREL) 50 MG tablet Take 0.5 tablets (25 mg total) by mouth at bedtime.  . gabapentin (NEURONTIN) 600 MG tablet Take 1 tablet (600 mg total) by mouth 3 (three) times daily.   Current Facility-Administered Medications on File Prior to Visit  Medication  . 0.9 %  sodium chloride infusion  . sodium chloride 0.9 % injection 3 mL     Allergies  Allergen Reactions  . Lisinopril Cough    Review Of Systems:  Constitutional:   No  weight loss, night sweats,  Fevers, chills, fatigue, or  lassitude.  HEENT:   No headaches,  Difficulty swallowing,  Tooth/dental problems, or  Sore throat,                No sneezing, itching, ear ache, nasal congestion, ? post nasal drip,   CV:  No chest pain,  Orthopnea, PND, swelling in lower extremities, anasarca, dizziness, palpitations, syncope.   GI  No heartburn, indigestion, abdominal pain, nausea, vomiting, diarrhea, change in bowel habits, loss of  appetite, bloody stools.   Resp: Occasional  shortness of breath with exertion or at rest.  No excess mucus, no productive cough,  No non-productive cough,  No coughing up of blood.  No change in color of mucus.  No wheezing.  No chest wall deformity  Skin: no rash or lesions.  GU: no dysuria, change in color of urine, no urgency or frequency.  No flank pain, no hematuria   MS:  No joint pain or swelling.  No decreased range of motion.  No back pain.  Psych:  No change in mood or affect. No depression or anxiety.  No memory loss.   Vital Signs BP (!) 122/54 (BP Location: Right Arm, Cuff Size: Normal)   Pulse 60   Ht 5\' 6"  (1.676 m)   Wt 205 lb 3.2 oz (93.1 kg)   SpO2 95%   BMI 33.12 kg/m    Physical Exam:  General- No distress,  A&Ox3, pleasant ENT: No sinus tenderness, TM clear, pale nasal mucosa, no oral exudate,no post nasal drip, no LAN Cardiac: S1, S2, regular rate and rhythm, no murmur Chest: No wheeze/ rales/ dullness; no accessory muscle use, no nasal flaring, no sternal retractions, diminished per bases Abd.: Soft Non-tender Ext: No clubbing cyanosis, edema Neuro:  Deconditioned at baseline, MAE x 4, A&O x 3 Skin: No rashes, warm and dry Psych: normal mood and behavior, appears calm   Assessment/Plan  Cough Resolved with pred taper Suspect multifactorial etiology ( COPD, Allergic rhinitis with PND)  Plan Please continue taking Claritin every day. We will renew your Tessalon 200 mg Take twice daily as needed for cough Use Pro Air as needed for breakthrough shortness of breath  Sips of water instead of throat clearing Throat soothing with sugar free [employer] or [employer]. Follow up with Dr. Delton Coombes for yearly check up 10/11/2017 at 1:30. Please contact office for sooner follow up if symptoms do not improve or worsen or seek emergency care  Anemia Monitor for decrease in baseline HGB Check CBC prn with increased dyspnea     Bevelyn Ngo, NP 09/19/2017  7:45 PM

## 2017-09-19 NOTE — Assessment & Plan Note (Addendum)
Resolved with pred taper Suspect multifactorial etiology ( COPD, Allergic rhinitis with PND)  Plan Please continue taking Claritin every day. We will renew your Tessalon 200 mg Take twice daily as needed for cough Use Pro Air as needed for breakthrough shortness of breath  Sips of water instead of throat clearing Throat soothing with sugar free Archie [employer]boaJolly ranchers or [employer] originals. Follow up with Dr. Delton CoombesByrum for yearly check up 10/11/2017 at 1:30. Please contact office for sooner follow up if symptoms do not improve or worsen or seek emergency care

## 2017-09-20 ENCOUNTER — Telehealth: Payer: Self-pay | Admitting: Neurology

## 2017-09-20 NOTE — Telephone Encounter (Signed)
Patient has had multiple UTI's and lung issues. He just has chronic cough at the moment. No fevers. Has not had battery placed. Do you want him to keep appt on Monday?

## 2017-09-20 NOTE — Telephone Encounter (Signed)
Its okay to cancel if he doesn't feel that he needs appt.  Just make appointment for a few months so it is on schedule if needed.

## 2017-09-20 NOTE — Telephone Encounter (Signed)
Patient's wife made aware. She wants to hold off on appt. I tried to reschedule, but she wanted to call back to do that. Made her aware we are booking out pretty far and should call asap to make follow up.

## 2017-09-20 NOTE — Telephone Encounter (Signed)
Pt's spouse called and said pt has been really sick and she does not know if she should keep pt's appointment on Monday, has asked for a call back

## 2017-09-21 DIAGNOSIS — I619 Nontraumatic intracerebral hemorrhage, unspecified: Secondary | ICD-10-CM | POA: Diagnosis not present

## 2017-09-21 DIAGNOSIS — R339 Retention of urine, unspecified: Secondary | ICD-10-CM | POA: Diagnosis not present

## 2017-09-21 DIAGNOSIS — G8110 Spastic hemiplegia affecting unspecified side: Secondary | ICD-10-CM | POA: Diagnosis not present

## 2017-09-24 ENCOUNTER — Other Ambulatory Visit: Payer: Self-pay

## 2017-09-24 ENCOUNTER — Telehealth: Payer: Self-pay | Admitting: Neurology

## 2017-09-24 ENCOUNTER — Ambulatory Visit: Payer: PPO

## 2017-09-24 ENCOUNTER — Encounter: Payer: PPO | Admitting: Neurology

## 2017-09-24 NOTE — Telephone Encounter (Signed)
Patient scheduled 11/19/17 at 2:30 pm.

## 2017-09-24 NOTE — Telephone Encounter (Signed)
Patient's wife called regarding needing to reschedule Brendan [name]'s appointment. The next follow up is in September for Dr. Arbutus Leasat. They had to cancel 10/01/17.  Is there another spot available or work in spot for him before September? Please Advise. Thanks

## 2017-09-24 NOTE — Patient Outreach (Signed)
Telephone assessment: Placed call to patient and spoke with wife. Wife states that patient is doing well, reports an appointment scheduled with Dr. Vertell Limber next month to discuss the battery placement. Also wife states patient has had recent wheezing and is being treated with an inhaler. Reports no recent UTI and wife states she thinks patient will have to be in and out catherterized for long term.  Wife states patient is sleeping per his normal.  Wife denies any new problems or concerns today.   Reviewed with wife patient has completed his goals. Reviewed case closure with wife and she is in agreement. Encouraged wife to call me for needs in the future.  PLAN: case closed. Goals met. MD and patient letter sent.  Tomasa Rand, RN, BSN, CEN [facility] [employer] [phone]

## 2017-09-24 NOTE — Telephone Encounter (Signed)
Patient's wife made aware of appts.

## 2017-09-28 ENCOUNTER — Encounter: Payer: Self-pay | Admitting: Physical Medicine & Rehabilitation

## 2017-09-28 ENCOUNTER — Encounter: Payer: PPO | Attending: Registered Nurse | Admitting: Physical Medicine & Rehabilitation

## 2017-09-28 VITALS — BP 111/67 | HR 62 | Ht 66.0 in | Wt 206.6 lb

## 2017-09-28 DIAGNOSIS — Z87891 Personal history of nicotine dependence: Secondary | ICD-10-CM | POA: Diagnosis not present

## 2017-09-28 DIAGNOSIS — F09 Unspecified mental disorder due to known physiological condition: Secondary | ICD-10-CM

## 2017-09-28 DIAGNOSIS — I25810 Atherosclerosis of coronary artery bypass graft(s) without angina pectoris: Secondary | ICD-10-CM | POA: Diagnosis not present

## 2017-09-28 DIAGNOSIS — N319 Neuromuscular dysfunction of bladder, unspecified: Secondary | ICD-10-CM | POA: Insufficient documentation

## 2017-09-28 DIAGNOSIS — I619 Nontraumatic intracerebral hemorrhage, unspecified: Secondary | ICD-10-CM

## 2017-09-28 DIAGNOSIS — R451 Restlessness and agitation: Secondary | ICD-10-CM | POA: Diagnosis not present

## 2017-09-28 DIAGNOSIS — I10 Essential (primary) hypertension: Secondary | ICD-10-CM | POA: Insufficient documentation

## 2017-09-28 DIAGNOSIS — G8110 Spastic hemiplegia affecting unspecified side: Secondary | ICD-10-CM | POA: Diagnosis not present

## 2017-09-28 DIAGNOSIS — Z955 Presence of coronary angioplasty implant and graft: Secondary | ICD-10-CM | POA: Diagnosis not present

## 2017-09-28 DIAGNOSIS — R339 Retention of urine, unspecified: Secondary | ICD-10-CM | POA: Diagnosis not present

## 2017-09-28 DIAGNOSIS — G250 Essential tremor: Secondary | ICD-10-CM | POA: Insufficient documentation

## 2017-09-28 DIAGNOSIS — Z8249 Family history of ischemic heart disease and other diseases of the circulatory system: Secondary | ICD-10-CM | POA: Diagnosis not present

## 2017-09-28 DIAGNOSIS — G479 Sleep disorder, unspecified: Secondary | ICD-10-CM

## 2017-09-28 DIAGNOSIS — R1310 Dysphagia, unspecified: Secondary | ICD-10-CM | POA: Insufficient documentation

## 2017-09-28 DIAGNOSIS — Z9889 Other specified postprocedural states: Secondary | ICD-10-CM | POA: Diagnosis not present

## 2017-09-28 DIAGNOSIS — F419 Anxiety disorder, unspecified: Secondary | ICD-10-CM | POA: Diagnosis not present

## 2017-09-28 MED ORDER — TRAZODONE HCL 50 MG PO TABS: 50.0000 mg | ORAL_TABLET | Freq: Every day | ORAL | 1 refills | Status: DC

## 2017-09-28 NOTE — Progress Notes (Addendum)
Subjective:    Patient ID: Brendan [name], male    DOB: September 16, 1940, 77 y.o.   MRN: [id number]  HPI 77 year old right-handed male with history of CAD and stenting as well as CABG, memory loss with bilateral deep brain stimulator placement presents for follow up for left frontal intraparenchymal hemorrhage, DBS, debility.  Last clinic visit 08/17/17.  Wife provides majority of history. Since that time, he states he is doing some HEP. He is following up with Neurology/Neurosurg. He had a lung infection and had to delay DBS battery hook up surgery.  He tolerated increase in Gabapentin with benefit. BP is controlled. He is taking Robaxin ~1/day. He is still not sleeping well.  He is still requiring I/O caths. Denies falls.  Pain Inventory Average Pain 3 Pain Right Now 3 My pain is sharp and aching  In the last 24 hours, has pain interfered with the following? General activity 7 Relation with others 3 Enjoyment of life 1 What TIME of day is your pain at its worst? night Sleep (in general) Poor  Pain is worse with: walking, sitting and some activites Pain improves with: medication Relief from Meds: 2  Mobility use a walker ability to climb steps?  yes do you drive?  no  Function retired  Neuro/Psych bladder control problems weakness tremor trouble walking dizziness confusion  Prior Studies Any changes since last visit?  no  Physicians involved in your care Any changes since last visit?  no   Family History  Problem Relation Age of Onset  . Heart disease Mother   . Heart disease Brother   . Tremor Paternal Uncle   . Heart disease Brother   . Drug abuse Daughter   . Hepatitis C Daughter    Social History   Socioeconomic History  . Marital status: Married    Spouse name: [name]  . Number of children: 2  . Years of education: 10  . Highest education level: Not on file  Occupational History  . Occupation: retired    Comment: Geneticist, molecular - Event organiser x 30  years  Social Needs  . Financial resource strain: Not hard at all  . Food insecurity:    Worry: Never true    Inability: Never true  . Transportation needs:    Medical: No    Non-medical: No  Tobacco Use  . Smoking status: Former Smoker    Packs/day: 3.00    Years: 20.00    Pack years: 60.00    Types: Cigarettes    Last attempt to quit: 04/17/1968    Years since quitting: 49.4  . Smokeless tobacco: Never Used  Substance and Sexual Activity  . Alcohol use: No    Frequency: Never  . Drug use: No  . Sexual activity: Never  Lifestyle  . Physical activity:    Days per week: 0 days    Minutes per session: 0 min  . Stress: Only a little  Relationships  . Social connections:    Talks on phone: More than three times a week    Gets together: Three times a week    Attends religious service: Patient refused    Active member of club or organization: Patient refused    Attends meetings of clubs or organizations: Patient refused    Relationship status: Married  Other Topics Concern  . Not on file  Social History Narrative   Married.  Wife is Royal Piedra.   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   Former smoker, quit  20 years ago. Does not drink alcohol.   Does not get routine exercise, walks sometimes with a cane.   Caffeine use: diet-soda   Left handed    Past Surgical History:  Procedure Laterality Date  . CARDIAC CATHETERIZATION    . CATARACT EXTRACTION, BILATERAL    . CORONARY ARTERY BYPASS GRAFT  06/2002   LIMA-LAD, SVG-D1, SVG-OM  . EYE SURGERY    . KNEE SURGERY Left   . LEFT HEART CATHETERIZATION WITH CORONARY/GRAFT ANGIOGRAM N/A 10/13/2011   Procedure: LEFT HEART CATHETERIZATION WITH Isabel Caprice;  Surgeon: Marykay Lex, MD;  Location: [facility] CATH LAB;  Service: Cardiovascular;  Laterality: N/A;  . MINOR PLACEMENT OF FIDUCIAL N/A 04/26/2017   Procedure: Fiducial placement;  Surgeon: Maeola Harman, MD;  Location: [facility] OR;  Service: Neurosurgery;   Laterality: N/A;  Fiducial placement  . NM MYOCAR PERF WALL MOTION  03/23/2009   protocol:Bruce, normal perfusion in all regions, post-stress EF 72%, exercise capacity , EKG negative for ischemia.  . Shoulder Orthoscopic Surgery   06/2003  . SUBTHALAMIC STIMULATOR INSERTION Bilateral 05/04/2017   Procedure: Bilateral Deep brain stimulator placement;  Surgeon: Maeola Harman, MD;  Location: [facility] OR;  Service: Neurosurgery;  Laterality: Bilateral;  Bilateral deep brain stimulator placement  . TRANSTHORACIC ECHOCARDIOGRAM  04/2017   Jan 2019: Normal LV size and function.  EF 55-60%.  No or W MA.  Mild LA dilation.  . wrist ganglion cyst Left    Past Medical History:  Diagnosis Date  . Anxiety disorder   . Benign enlargement of prostate   . CAD in native artery 1997   Referred for CABG x3 in 2004 for LAD diagonal bifurcation lesion  . Cervical spinal stenosis   . Complex regional pain syndrome of right upper extremity    Right wrist; L arm  . Coronary atherosclerosis of artery bypass graft June 2013   Occluded SVG-D1; Cardiologist Dr. Herbie Baltimore  . Gait disorder   . Gastroesophageal reflux disease   . Glaucoma   . Hiatal hernia    GI: Dr Evette Cristal  . Hyperlipidemia LDL goal <70   . Hypertension   . Memory loss    Mild  . Obesity   . Peripheral neuropathy    possible peripheral neuropathy  . S/P CABG x 3 2004    LIMA-LAD, SVG to diagonal, SVG to OM  . Tremor, essential    On Primidone  . Unstable angina pectoris [facility])  June 2013   Cardiac cath: occluded SVG-DI. Patent LIMA-LAD and SVG-OM. EF 45% with apical inferior HK.   BP 111/67   Pulse 62   Ht 5\' 6"  (1.676 m)   Wt 206 lb 9.6 oz (93.7 kg)   SpO2 94%   BMI 33.35 kg/m   Opioid Risk Score:   Fall Risk Score:  `1  Depression screen PHQ 2/9  Depression screen [facility] 2/9 08/29/2017 08/17/2017 07/24/2017 06/14/2017 06/12/2017 03/06/2017 11/28/2016  Decreased Interest 0 0 0 0 0 0 0  Down, Depressed, Hopeless 0 0 0 0 0 0 0  PHQ - 2 Score 0  0 0 0 0 0 0  Some recent data might be hidden     Review of Systems  Constitutional: Positive for diaphoresis.  HENT: Negative.   Eyes: Negative.   Respiratory: Negative.   Cardiovascular: Negative.   Gastrointestinal: Negative.   Endocrine: Negative.   Genitourinary: Negative.   Musculoskeletal: Positive for gait problem.  Skin: Negative.   Allergic/Immunologic: Negative.   Neurological: Positive for dizziness,  tremors and weakness.  Hematological: Negative.   Psychiatric/Behavioral: Positive for confusion.  All other systems reviewed and are negative.      Objective:   Physical Exam General: NAD. Well-developed.  HEENT: Normocephalic, atraumatic. Cardio: RRR. No JVD. Resp: CTA Bilaterally. Normal  effort   GI: BS positive and ND Musc/Skel:  No edema, no tenderness. Neuro: Alert and oriented Aphasia continues to improve Motor:  RUE: 4+/5 proximal to distal RLE: 4+/5 proximal to distal LUE tremor Dysarthria Skin:   Intact. Warm and dry    Assessment & Plan:  77 year old right-handed male with history of CAD and stenting as well as CABG, memory loss with bilateral deep brain stimulator placement presents for follow up for left frontal intraparenchymal hemorrhage, DBS, debility.  1. Decreased functional mobility secondary to left frontal intraparenchymal hemorrhage with recent deep brain stimulator for tremor.    Cont HEP  Cont follow up with Neurology, Neurosurg  2. Pain Management:    Mainly in Right leg at present  Cont Neurontin 600 mg 3 times a day  Cont Robaxin 500 BID PRN   Cont tylenol as needed  3. Tremors:  Cont meds  Cont to follow up for battery placement for DBS, maybe next month  4. Sleep disturbance    Cont Melatonin   Will increase Trazodone to 50 qhs  5.  Neurogenic bladder  Cont Flomax   Cont follow up with Urology  Cont I/O caths

## 2017-10-01 ENCOUNTER — Encounter: Payer: PPO | Admitting: Neurology

## 2017-10-05 DIAGNOSIS — G8110 Spastic hemiplegia affecting unspecified side: Secondary | ICD-10-CM | POA: Diagnosis not present

## 2017-10-05 DIAGNOSIS — I619 Nontraumatic intracerebral hemorrhage, unspecified: Secondary | ICD-10-CM | POA: Diagnosis not present

## 2017-10-10 ENCOUNTER — Encounter: Payer: Self-pay | Admitting: Cardiology

## 2017-10-10 ENCOUNTER — Ambulatory Visit (INDEPENDENT_AMBULATORY_CARE_PROVIDER_SITE_OTHER): Payer: PPO | Admitting: Cardiology

## 2017-10-10 VITALS — BP 126/70 | HR 61 | Ht 66.0 in | Wt 210.2 lb

## 2017-10-10 DIAGNOSIS — E785 Hyperlipidemia, unspecified: Secondary | ICD-10-CM

## 2017-10-10 DIAGNOSIS — R600 Localized edema: Secondary | ICD-10-CM | POA: Diagnosis not present

## 2017-10-10 DIAGNOSIS — I25119 Atherosclerotic heart disease of native coronary artery with unspecified angina pectoris: Secondary | ICD-10-CM | POA: Diagnosis not present

## 2017-10-10 DIAGNOSIS — I10 Essential (primary) hypertension: Secondary | ICD-10-CM | POA: Diagnosis not present

## 2017-10-10 DIAGNOSIS — E669 Obesity, unspecified: Secondary | ICD-10-CM | POA: Diagnosis not present

## 2017-10-10 DIAGNOSIS — Z9689 Presence of other specified functional implants: Secondary | ICD-10-CM | POA: Diagnosis not present

## 2017-10-10 DIAGNOSIS — Z01810 Encounter for preprocedural cardiovascular examination: Secondary | ICD-10-CM | POA: Diagnosis not present

## 2017-10-10 NOTE — Progress Notes (Signed)
PCP: Brendan MarchAmin, [name], MD  NeuroSgx: Dr. Venetia MaxonStern Neurologist: Dr. Arbutus [name]  Clinic Note: Chief Complaint  Patient presents with  . Follow-up    NO active symptom  . Coronary Artery Disease    HPI: Brendan [name] is a 77 y.o. male with a PMH below who presents today for 5219-month follow-up.  Brendan [name] is a long-term patient of Dr. Julieanne MansonAlfred [name], who then began following up with me in 2013-2014 timeframe. He is a long-standing history of CAD s/p CABG, HLD, HTN, GERD  CAD -CABGx3 in 1997.   Unstable Angina in June 2013 - Occluded SVG-Diag w/Patent LIMA-LAD & SVG-OM treated medically. He does have chronic stable angina.   He is on Inderal as BB to help Rx tremors along with CAD.   Last Myoview 09/2015: EF 55-65%. No ischemia or infarction noted. LOW RISK   He has been going through a long & drawn out process of having a Deep Brain Stimulator installed - has had all but 1 of the procedures (having the batteries hooked up) done.  I actually saw him in consultation for actually post-op as a follow-up preop consult May 04, 2017 following stage I of bilateral deep brain stimulator with stage II requiring general anesthesia.  Apparently, he mentioned a episodes of Chest Pain for which he took NTG (was several months earlier).  -- We had originally planned to try for a Myoview ST, but decided against it b/c image quality would not be good enough due to his tremor.  He also confirmed that the last time he took NTG was ~6 months prior to this hospitalization. --> he did complete the planned surgery & had an uneventful post-op course from a cardiac standpoint.  Recent Hospitalizations:  none since last visit.   Studies Personally Reviewed - (if available, images/films reviewed: From Epic Chart or Care Everywhere)  No new studies.   Interval History: Brendan [name] returns having stage of his surgery.  There is been just several small issues one a creatinine that have kept this from happening.  In  truth, Brendan [name] remains relatively inactive debilitated from his Parkinson's.  However he does still try to walk some.  He has not had to use an occlusion since last time I saw him indicating no recurrent chest tightness or pressure with rest or with any type of exertion.  He has always had some right-sided lower extremity edema or than left, but has not noticed any change dose of Lasix.  He does not sleep lying down because of breathing issues, denies any PND. He denies any sensation of rapid irregular heartbeats palpitations. No syncope or near syncope, TIA or amaurosis fugax symptoms. No claudication.  He is back on aspirin but still on Plavix.  He remains on propranolol for his tremor, other beta-blockers have not been effective.   ROS: A comprehensive was performed. Review of Systems  Constitutional: Positive for malaise/fatigue (still a bit lethargic after prolonged hospital stay in [name]). Negative for chills and fever.  HENT: Negative for congestion and nosebleeds.   Respiratory: Negative for cough, shortness of breath and wheezing.   Cardiovascular: Positive for leg swelling (Relatively controlled).  Gastrointestinal: Negative for abdominal pain, constipation, heartburn and vomiting.  Genitourinary: Positive for dysuria (As a result of recent UTI.  Now cleared up).  Musculoskeletal: Positive for joint pain.  Neurological: Positive for dizziness, tremors, weakness (Generalized) and headaches.  Psychiatric/Behavioral: Positive for memory loss. The patient is not nervous/anxious and does not have insomnia.   All  other systems reviewed and are negative.  I have reviewed and (if needed) personally updated the patient's problem list, medications, allergies, past medical and surgical history, social and family history.   Past Medical History:  Diagnosis Date  . Anxiety disorder   . Benign enlargement of prostate   . CAD in native artery 1997   Referred for CABG x3 in 2004 for LAD diagonal  bifurcation lesion  . Cervical spinal stenosis   . Complex regional pain syndrome of right upper extremity    Right wrist; L arm  . Coronary atherosclerosis of artery bypass graft June 2013   Occluded SVG-D1; Cardiologist Dr. Herbie Baltimore  . Gait disorder   . Gastroesophageal reflux disease   . Glaucoma   . Hiatal hernia    GI: Dr Evette Cristal  . Hyperlipidemia LDL goal <70   . Hypertension   . Memory loss    Mild  . Obesity   . Peripheral neuropathy    possible peripheral neuropathy  . S/P CABG x 3 2004    LIMA-LAD, SVG to diagonal, SVG to OM  . Tremor, essential    On Primidone  . Unstable angina pectoris [facility])  June 2013   Cardiac cath: occluded SVG-DI. Patent LIMA-LAD and SVG-OM. EF 45% with apical inferior HK.    Past Surgical History:  Procedure Laterality Date  . CARDIAC CATHETERIZATION    . CATARACT EXTRACTION, BILATERAL    . CORONARY ARTERY BYPASS GRAFT  06/2002   LIMA-LAD, SVG-D1, SVG-OM  . EYE SURGERY    . KNEE SURGERY Left   . LEFT HEART CATHETERIZATION WITH CORONARY/GRAFT ANGIOGRAM N/A 10/13/2011   Procedure: LEFT HEART CATHETERIZATION WITH Isabel Caprice;  Surgeon: Marykay Lex, MD;  Location: [facility] CATH LAB;  Service: Cardiovascular;  Laterality: N/A;  . MINOR PLACEMENT OF FIDUCIAL N/A 04/26/2017   Procedure: Fiducial placement;  Surgeon: Maeola Harman, MD;  Location: [facility] OR;  Service: Neurosurgery;  Laterality: N/A;  Fiducial placement  . NM MYOCAR PERF WALL MOTION  03/23/2009   protocol:Bruce, normal perfusion in all regions, post-stress EF 72%, exercise capacity , EKG negative for ischemia.  . Shoulder Orthoscopic Surgery   06/2003  . SUBTHALAMIC STIMULATOR INSERTION Bilateral 05/04/2017   Procedure: Bilateral Deep brain stimulator placement;  Surgeon: Maeola Harman, MD;  Location: [facility] OR;  Service: Neurosurgery;  Laterality: Bilateral;  Bilateral deep brain stimulator placement  . TRANSTHORACIC ECHOCARDIOGRAM  04/2017   Jan 2019: Normal LV size and function.   EF 55-60%.  No or W MA.  Mild LA dilation.  . wrist ganglion cyst Left     Current Meds  Medication Sig  . ALLERGY 10 MG tablet TAKE 1 TABLET BY MOUTH DAILY  . aspirin 81 MG chewable tablet Chew 1 tablet (81 mg total) by mouth daily.  [name] atorvastatin (LIPITOR) 40 MG tablet Take 1 tablet (40 mg total) by mouth daily.  . baclofen (LIORESAL) 10 MG tablet Take 10 mg by mouth every evening.  . Baclofen 5 MG TABS Take 1 tablet by mouth every morning.  . benzonatate (TESSALON) 200 MG capsule Take 1 capsule (200 mg total) by mouth 3 (three) times daily as needed for cough.  . furosemide (LASIX) 20 MG tablet Take 20 mg by mouth daily.  [name] ketoconazole (NIZORAL) 2 % shampoo Apply 1 application topically 2 (two) times a week. (Patient taking differently: Apply 1 application topically once a week. )  . losartan (COZAAR) 50 MG tablet Take 1 tablet (50 mg total) by mouth daily.  [name]  Melatonin 3 MG TABS Take 1 tablet (3 mg total) by mouth at bedtime.  . methocarbamol (ROBAXIN) 500 MG tablet Take 1 tablet (500 mg total) by mouth 2 (two) times daily as needed for muscle spasms.  . nitroGLYCERIN (NITROSTAT) 0.4 MG SL tablet Place 1 tablet (0.4 mg total) under the tongue every 5 (five) minutes as needed for chest pain. call 911 if chest pain not better  . omeprazole (PRILOSEC) 20 MG capsule Take 1 capsule (20 mg total) by mouth daily as needed (acid reflux).  . primidone (MYSOLINE) 50 MG tablet Take 1 tablet (50 mg total) by mouth 2 (two) times daily.  [name] PROAIR HFA 108 (90 Base) MCG/ACT inhaler Take 2 puffs by mouth every 6 (six) hours as needed.  . propranolol (INDERAL) 40 MG tablet Take 1 tablet (40 mg total) by mouth 2 (two) times daily.  [name] Propylene Glycol (SYSTANE BALANCE) 0.6 % SOLN Place 1 drop into both eyes daily as needed (for dry eyes).  . tamsulosin (FLOMAX) 0.4 MG CAPS capsule Take 1 capsule (0.4 mg total) by mouth daily after supper.  . traZODone (DESYREL) 50 MG tablet Take 1 tablet (50 mg total) by  mouth at bedtime.    Allergies  Allergen Reactions  . Lisinopril Cough    Social History   Tobacco Use  . Smoking status: Former Smoker    Packs/day: 3.00    Years: 20.00    Pack years: 60.00    Types: Cigarettes    Last attempt to quit: 04/17/1968    Years since quitting: 49.5  . Smokeless tobacco: Never Used  Substance Use Topics  . Alcohol use: No    Frequency: Never  . Drug use: No   Social History   Social History Narrative   Married.  Wife is Royal Piedra.   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   Former smoker, quit 20 years ago. Does not drink alcohol.   Does not get routine exercise, walks sometimes with a cane.   Caffeine use: diet-soda   Left handed     family history includes Drug abuse in his daughter; Heart disease in his brother, brother, and mother; Hepatitis C in his daughter; Tremor in his paternal uncle.  Wt Readings from Last 3 Encounters:  10/11/17 211 lb (95.7 kg)  10/10/17 210 lb 4 oz (95.4 kg)  09/28/17 206 lb 9.6 oz (93.7 kg)    PHYSICAL EXAM BP 126/70   Pulse 61   Ht 5\' 6"  (1.676 m)   Wt 210 lb 4 oz (95.4 kg)   SpO2 96%   BMI 33.94 kg/m  Physical Exam  Constitutional: He is oriented to person, place, and time. He appears well-developed and well-nourished. No distress.  Somewhat ill-appearing.  Fatigue looking.  No acute distress.  Well-groomed.  HENT:  Head: Atraumatic.  Mouth/Throat: No oropharyngeal exudate.  His hair is starting to grow back, but he has well healing suture lines on his head.  Eyes: EOM are normal.  Neck: No hepatojugular reflux and no JVD present. Carotid bruit is not present.  Cardiovascular: Normal rate, regular rhythm, S1 normal, S2 normal, normal heart sounds and intact distal pulses.  No extrasystoles are present. PMI is not displaced. Exam reveals no gallop and no friction rub.  No murmur heard. Pulmonary/Chest: Effort normal and breath sounds normal. No respiratory distress. He has no wheezes.  He has no rales. He exhibits no tenderness.  Abdominal: Soft. Bowel sounds are normal. He exhibits no distension. There is  no tenderness.  Musculoskeletal: Normal range of motion. He exhibits edema (RLE 1-2+, LLE trace).  Neurological: He is alert and oriented to person, place, and time.  Skin: He is not diaphoretic.  Psychiatric: He has a normal mood and affect. His behavior is normal. Judgment and thought content normal.  Still a bit slow with responses, but better than last visit    Adult ECG Report N/a  Other studies Reviewed: Additional studies/ records that were reviewed today include:  Recent Labs:   Lab Results  Component Value Date   CREATININE 1.13 08/29/2017   BUN 12 08/29/2017   NA 138 08/29/2017   K 4.9 08/29/2017   CL 97 08/29/2017   CO2 24 08/29/2017   Lab Results  Component Value Date   CHOL 115 05/16/2017   HDL 29 (L) 05/16/2017   LDLCALC 73 05/16/2017   LDLDIRECT 84 08/30/2006   TRIG 64 05/16/2017   CHOLHDL 4.0 05/16/2017   Lab Results  Component Value Date   WBC 4.5 08/29/2017   HGB 10.9 (L) 08/29/2017   HCT 34.7 (L) 08/29/2017   MCV 95 08/29/2017   PLT 128 (L) 08/29/2017   Lab Results  Component Value Date   HGBA1C 5.8 (H) 05/16/2017    ASSESSMENT / PLAN: Problem List Items Addressed This Visit    S/P deep brain stimulator placement   Preoperative cardiovascular examination    Please see detailed notation in 3/18 for details.  Based upon ACC-AHA Guidelines - in the absence of active Angina or CHF symptoms, would not recommend additional ischemic evaluation as this would not alter current management.  Despite all of his Non-Cardiac conditions, from a strictly Cardiac standpoint, he would be considered LOW RISK by the Revised Cardiac Risk Index.      RESOLVED: Obesity (BMI 30-39.9) (Chronic)    Pretty much limited to dietary adjustment - not really able to exercise.      Lower extremity edema (Chronic)    Still has unitlateral LE edema --  continue lasix (would not try to wean, but can consider making it PRN). If able, would recommend support stockings as there is likely a neurogenic component.      Hyperlipidemia LDL goal <70 (Chronic)    Now on atorvastatin - after converting from simvastatin.  Tolerating well, but is not interested in taking any additional medications.      Essential hypertension (Chronic)    Blood pressure is well controlled on current dose of Losartan and propranolol.      Atherosclerosis of native coronary artery of native heart with angina pectoris (HCC) - Primary (Chronic)    He does have chronic stable angina but is well controlled now actually his current dose of propranolol.  He is on moderate dose losartan for afterload reduction. Remains on aspirin and statin.  No longer on Imdur.  No recurrent use of nitroglycerin in the past 3 months.         Current medicines are reviewed at length with the patient today. (+/- concerns) n/a The following changes have been made: see below   Patient Instructions  NO CHANGES WITH MEDICATIONS   Your physician wants you to follow-up in 6 MONTHS WITH DR HARDING. You will receive a reminder letter in the mail two months in advance. If you don't receive a letter, please call our office to schedule the follow-up appointment.   If you need a refill on your cardiac medications before your next appointment, please call your pharmacy.    Studies  Ordered:   No orders of the defined types were placed in this encounter.     Bryan Lemma, M.D., M.S. Interventional Cardiologist   Pager # [phone] Phone # [phone] [address] [place], [place] [address]   Thank you for choosing [facility] at [facility]!!

## 2017-10-10 NOTE — Patient Instructions (Signed)
NO CHANGES WITH MEDICATIONS    Your physician wants you to follow-up in 6 MONTHS WITH DR HARDING. You will receive a reminder letter in the mail two months in advance. If you don't receive a letter, please call our office to schedule the follow-up appointment.    If you need a refill on your cardiac medications before your next appointment, please call your pharmacy.  

## 2017-10-11 ENCOUNTER — Encounter: Payer: Self-pay | Admitting: Emergency Medicine

## 2017-10-11 ENCOUNTER — Ambulatory Visit: Payer: PPO | Admitting: Emergency Medicine

## 2017-10-11 VITALS — BP 138/80 | HR 60 | Ht 66.0 in | Wt 211.0 lb

## 2017-10-11 DIAGNOSIS — J920 Pleural plaque with presence of asbestos: Secondary | ICD-10-CM | POA: Diagnosis not present

## 2017-10-11 DIAGNOSIS — R05 Cough: Secondary | ICD-10-CM | POA: Diagnosis not present

## 2017-10-11 DIAGNOSIS — R059 Cough, unspecified: Secondary | ICD-10-CM

## 2017-10-11 NOTE — Patient Instructions (Addendum)
We will plan to repeat your CT chest in March 2020 to follow your pleural plaques and asbestos changes.  Continue your allergy medication every day Use omeprazole 20mg  as needed for reflux symptoms.  Follow with Dr Delton CoombesByrum in March 2020 to review, or sooner if you have any problems.

## 2017-10-11 NOTE — Progress Notes (Signed)
Subjective:    Patient ID: Brendan [name], male    DOB: [birth year], 77 y.o.   MRN: [id number]  HPI  ROV 10/11/17 --77 year old male with a history of tobacco use, asbestos exposure with pleural plaques on CT chronic cough in the setting of some GERD and allergic rhinitis, also possible intermittent aspiration.  He was recently seen here in May for a flare of his cough.  This got better with prednisone and confirmation of his maintenance therapy. He feels at baseline, cough is better.                                                                                                                                                                                                 Review of Systems As per history of present illness Past Medical History:  Diagnosis Date  . Anxiety disorder   . Benign enlargement of prostate   . CAD in native artery 1997   Referred for CABG x3 in 2004 for LAD diagonal bifurcation lesion  . Cervical spinal stenosis   . Complex regional pain syndrome of right upper extremity    Right wrist; L arm  . Coronary atherosclerosis of artery bypass graft June 2013   Occluded SVG-D1; Cardiologist Dr. Herbie Baltimore  . Gait disorder   . Gastroesophageal reflux disease   . Glaucoma   . Hiatal hernia    GI: Dr Evette Cristal  . Hyperlipidemia LDL goal <70   . Hypertension   . Memory loss    Mild  . Obesity   . Peripheral neuropathy    possible peripheral neuropathy  . S/P CABG x 3 2004    LIMA-LAD, SVG to diagonal, SVG to OM  . Tremor, essential    On Primidone  . Unstable angina pectoris [facility])  June 2013   Cardiac cath: occluded SVG-DI. Patent LIMA-LAD and SVG-OM. EF 45% with apical inferior HK.     Family History  Problem Relation Age of Onset  . Heart disease Mother   . Heart disease Brother   . Tremor Paternal Uncle   . Heart disease Brother   . Drug abuse Daughter   . Hepatitis C Daughter      Social History   Socioeconomic History  . Marital status: Married   Spouse name: [name]  . Number of children: 2  . Years of education: 10  . Highest education level: Not on file  Occupational History  . Occupation: retired    Comment: Geneticist, molecular - Event organiser x 30 years  Social Needs  . Financial resource strain: Not hard at all  . Food insecurity:  Worry: Never true    Inability: Never true  . Transportation needs:    Medical: No    Non-medical: No  Tobacco Use  . Smoking status: Former Smoker    Packs/day: 3.00    Years: 20.00    Pack years: 60.00    Types: Cigarettes    Last attempt to quit: 04/17/1968    Years since quitting: 49.5  . Smokeless tobacco: Never Used  Substance and Sexual Activity  . Alcohol use: No    Frequency: Never  . Drug use: No  . Sexual activity: Never  Lifestyle  . Physical activity:    Days per week: 0 days    Minutes per session: 0 min  . Stress: Only a little  Relationships  . Social connections:    Talks on phone: More than three times a week    Gets together: Three times a week    Attends religious service: Patient refused    Active member of club or organization: Patient refused    Attends meetings of clubs or organizations: Patient refused    Relationship status: Married  . Intimate partner violence:    Fear of current or ex partner: No    Emotionally abused: No    Physically abused: No    Forced sexual activity: No  Other Topics Concern  . Not on file  Social History Narrative   Married.  Wife is Royal PiedraMary Riles.   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   Former smoker, quit 20 years ago. Does not drink alcohol.   Does not get routine exercise, walks sometimes with a cane.   Caffeine use: diet-soda   Left handed      Allergies  Allergen Reactions  . Lisinopril Cough     Outpatient Medications Prior to Visit  Medication Sig Dispense Refill  . ALLERGY 10 MG tablet TAKE 1 TABLET BY MOUTH DAILY 30 tablet 5  . aspirin 81 MG chewable tablet Chew 1 tablet (81 mg total) by  mouth daily. 30 tablet 0  . atorvastatin (LIPITOR) 40 MG tablet Take 1 tablet (40 mg total) by mouth daily. 90 tablet 3  . baclofen (LIORESAL) 10 MG tablet Take 10 mg by mouth every evening.    . Baclofen 5 MG TABS Take 1 tablet by mouth every morning.    . benzonatate (TESSALON) 200 MG capsule Take 1 capsule (200 mg total) by mouth 3 (three) times daily as needed for cough. 30 capsule 1  . furosemide (LASIX) 20 MG tablet Take 20 mg by mouth daily.    [name]. ketoconazole (NIZORAL) 2 % shampoo Apply 1 application topically 2 (two) times a week. (Patient taking differently: Apply 1 application topically once a week. ) 120 mL 1  . losartan (COZAAR) 50 MG tablet Take 1 tablet (50 mg total) by mouth daily. 30 tablet 11  . Melatonin 3 MG TABS Take 1 tablet (3 mg total) by mouth at bedtime. 30 tablet 0  . methocarbamol (ROBAXIN) 500 MG tablet Take 1 tablet (500 mg total) by mouth 2 (two) times daily as needed for muscle spasms. 60 tablet 1  . nitroGLYCERIN (NITROSTAT) 0.4 MG SL tablet Place 1 tablet (0.4 mg total) under the tongue every 5 (five) minutes as needed for chest pain. call 911 if chest pain not better 30 tablet 3  . omeprazole (PRILOSEC) 20 MG capsule Take 1 capsule (20 mg total) by mouth daily as needed (acid reflux). 90 capsule 3  . primidone (MYSOLINE) 50 MG tablet  Take 1 tablet (50 mg total) by mouth 2 (two) times daily. 180 tablet 1  . PROAIR HFA 108 (90 Base) MCG/ACT inhaler Take 2 puffs by mouth every 6 (six) hours as needed.    . propranolol (INDERAL) 40 MG tablet Take 1 tablet (40 mg total) by mouth 2 (two) times daily. 60 tablet 11  . Propylene Glycol (SYSTANE BALANCE) 0.6 % SOLN Place 1 drop into both eyes daily as needed (for dry eyes).    . tamsulosin (FLOMAX) 0.4 MG CAPS capsule Take 1 capsule (0.4 mg total) by mouth daily after supper. 30 capsule 0  . traZODone (DESYREL) 50 MG tablet Take 1 tablet (50 mg total) by mouth at bedtime. 30 tablet 1  . gabapentin (NEURONTIN) 600 MG tablet  Take 1 tablet (600 mg total) by mouth 3 (three) times daily. 90 tablet 1   Facility-Administered Medications Prior to Visit  Medication Dose Route Frequency Provider Last Rate Last Dose  . 0.9 %  sodium chloride infusion   Intravenous Continuous Marykay Lex, MD      . sodium chloride 0.9 % injection 3 mL  3 mL Intravenous PRN Marykay Lex, MD             Objective:   Physical Exam Vitals:   10/11/17 1322  BP: 138/80  Pulse: 60  SpO2: 96%  Weight: 211 lb (95.7 kg)  Height: 5\' 6"  (1.676 m)   Gen: Pleasant, well-nourished, in no distress,  normal affect  ENT: No lesions,  mouth clear,  oropharynx clear, no postnasal drip  Neck: No JVD, no stridor  Lungs: No use of accessory muscles, distant, especially L upper  Cardiovascular: RRR, heart sounds normal, no murmur or gallops, no peripheral edema  Musculoskeletal: No deformities, no cyanosis or clubbing  Neuro: alert, non focal  Skin: Warm, no lesions or rashes     Assessment & Plan:  Asbestosis (HCC) With pleural plaque formation.  He needs repeat CT scan of the chest March 2020 to look for interval stability  Cough Multifactorial cough with contribution from GERD, allergic rhinitis.  He also probably has some intermittent aspiration.  He has not wanted to be on every day PPI due to the potential for side effects.  We talked about this today and will leave him on PPI as needed.  Continue his allergy regimen.  If his cough worsens then a repeat swallowing evaluation might be indicated  Levy Pupa, MD, PhD 10/11/2017, 1:48 PM [facility] [phone] or if no answer [phone]

## 2017-10-11 NOTE — Assessment & Plan Note (Signed)
Multifactorial cough with contribution from GERD, allergic rhinitis.  He also probably has some intermittent aspiration.  He has not wanted to be on every day PPI due to the potential for side effects.  We talked about this today and will leave him on PPI as needed.  Continue his allergy regimen.  If his cough worsens then a repeat swallowing evaluation might be indicated

## 2017-10-11 NOTE — Assessment & Plan Note (Signed)
With pleural plaque formation.  He needs repeat CT scan of the chest March 2020 to look for interval stability

## 2017-10-14 ENCOUNTER — Encounter: Payer: Self-pay | Admitting: Cardiology

## 2017-10-14 NOTE — Assessment & Plan Note (Signed)
Chronic stable angina - with no recent episodes requirn

## 2017-10-14 NOTE — Assessment & Plan Note (Signed)
He does have chronic stable angina but is well controlled now actually his current dose of propranolol.  He is on moderate dose losartan for afterload reduction. Remains on aspirin and statin.  No longer on Imdur.  No recurrent use of nitroglycerin in the past 3 months.

## 2017-10-14 NOTE — Assessment & Plan Note (Signed)
Now on atorvastatin - after converting from simvastatin.  Tolerating well, but is not interested in taking any additional medications.

## 2017-10-14 NOTE — Assessment & Plan Note (Signed)
Please see detailed notation in 3/18 for details.  Based upon ACC-AHA Guidelines - in the absence of active Angina or CHF symptoms, would not recommend additional ischemic evaluation as this would not alter current management.  Despite all of his Non-Cardiac conditions, from a strictly Cardiac standpoint, he would be considered LOW RISK by the Revised Cardiac Risk Index.

## 2017-10-14 NOTE — Assessment & Plan Note (Signed)
Blood pressure is well controlled on current dose of Losartan and propranolol.

## 2017-10-14 NOTE — Assessment & Plan Note (Signed)
Still has unitlateral LE edema -- continue lasix (would not try to wean, but can consider making it PRN). If able, would recommend support stockings as there is likely a neurogenic component.

## 2017-10-14 NOTE — Assessment & Plan Note (Signed)
Pretty much limited to dietary adjustment - not really able to exercise.

## 2017-10-15 ENCOUNTER — Telehealth: Payer: Self-pay | Admitting: Neurology

## 2017-10-15 DIAGNOSIS — Z6833 Body mass index (BMI) 33.0-33.9, adult: Secondary | ICD-10-CM | POA: Diagnosis not present

## 2017-10-15 DIAGNOSIS — G250 Essential tremor: Secondary | ICD-10-CM | POA: Diagnosis not present

## 2017-10-15 NOTE — Telephone Encounter (Signed)
Put pt on my schedule on 8/12 and 8/19, both at 2:30 for first and 2nd post op DBS programming.  Both are 1 hour visits.  Let pt/wife know.  Thank you

## 2017-10-19 ENCOUNTER — Other Ambulatory Visit: Payer: Self-pay | Admitting: Physical Medicine & Rehabilitation

## 2017-10-20 ENCOUNTER — Other Ambulatory Visit: Payer: Self-pay | Admitting: Cardiology

## 2017-10-22 ENCOUNTER — Other Ambulatory Visit: Payer: Self-pay | Admitting: Neurosurgery

## 2017-10-22 ENCOUNTER — Telehealth: Payer: Self-pay

## 2017-10-22 NOTE — Telephone Encounter (Signed)
Reorder pending.

## 2017-10-24 DIAGNOSIS — G8110 Spastic hemiplegia affecting unspecified side: Secondary | ICD-10-CM | POA: Diagnosis not present

## 2017-10-24 DIAGNOSIS — I619 Nontraumatic intracerebral hemorrhage, unspecified: Secondary | ICD-10-CM | POA: Diagnosis not present

## 2017-10-24 DIAGNOSIS — R339 Retention of urine, unspecified: Secondary | ICD-10-CM | POA: Diagnosis not present

## 2017-11-02 NOTE — Pre-Procedure Instructions (Signed)
Brendan [name]  11/02/2017      [facility] - [place] - [address] [address] [address] [address] [name] Phone: [phone] Fax: [phone]    Your procedure is scheduled on Tuesday July 30.  Report [facility] Admitting at 2:00 P.M.  Call this number if you have problems the morning of surgery:  [phone]   Remember:  Do not eat or drink after midnight.   Take these medicines the morning of surgery with A SIP OF WATER:  Primidone (mysoline) Gabapentin (neurontin) Omeprazole (prilosec) Robaxin if needed  7 days prior to surgery STOP taking any Aspirin(unless otherwise instructed by your surgeon), Aleve, Naproxen, Ibuprofen, Motrin, Advil, Goody's, BC's, all herbal medications, fish oil, and all vitamins     Do not wear jewelry, make-up or nail polish.  Do not wear lotions, powders, or perfumes, or deodorant.  Do not shave 48 hours prior to surgery.  Men may shave face and neck.  Do not bring valuables to the hospital.  [facility] is not responsible for any belongings or valuables.  Contacts, dentures or bridgework may not be worn into surgery.  Leave your suitcase in the car.  After surgery it may be brought to your room.  For patients admitted to the hospital, discharge time will be determined by your treatment team.  Patients discharged the day of surgery will not be allowed to drive home.   Special instructions:    [name]- Preparing For Surgery  Before surgery, you can play an important role. Because skin is not sterile, your skin needs to be as free of germs as possible. You can reduce the number of germs on your skin by washing with CHG (chlorahexidine gluconate) Soap before surgery.  CHG is an antiseptic cleaner which kills germs and bonds with the skin to continue killing germs even after washing.    Oral Hygiene is also important to reduce your risk of infection.  Remember - BRUSH  YOUR TEETH THE MORNING OF SURGERY WITH YOUR REGULAR TOOTHPASTE  Please do not use if you have an allergy to CHG or antibacterial soaps. If your skin becomes reddened/irritated stop using the CHG.  Do not shave (including legs and underarms) for at least 48 hours prior to first CHG shower. It is OK to shave your face.  Please follow these instructions carefully.   1. Shower the NIGHT BEFORE SURGERY and the MORNING OF SURGERY with CHG.   2. If you chose to wash your hair, wash your hair first as usual with your normal shampoo.  3. After you shampoo, rinse your hair and body thoroughly to remove the shampoo.  4. Use CHG as you would any other liquid soap. You can apply CHG directly to the skin and wash gently with a scrungie or a clean washcloth.   5. Apply the CHG Soap to your body ONLY FROM THE NECK DOWN.  Do not use on open wounds or open sores. Avoid contact with your eyes, ears, mouth and genitals (private parts). Wash Face and genitals (private parts)  with your normal soap.  6. Wash thoroughly, paying special attention to the area where your surgery will be performed.  7. Thoroughly rinse your body with warm water from the neck down.  8. DO NOT shower/wash with your normal soap after using and rinsing off the CHG Soap.  9. Pat yourself dry with a CLEAN TOWEL.  10. Wear CLEAN PAJAMAS  to bed the night before surgery, wear comfortable clothes the morning of surgery  11. Place CLEAN SHEETS on your bed the night of your first shower and DO NOT SLEEP WITH PETS.    Day of Surgery:  Do not apply any deodorants/lotions.  Please wear clean clothes to the hospital/surgery center.   Remember to brush your teeth WITH YOUR REGULAR TOOTHPASTE.    Please read over the following fact sheets that you were given. Coughing and Deep Breathing and Surgical Site Infection Prevention

## 2017-11-05 ENCOUNTER — Other Ambulatory Visit: Payer: Self-pay

## 2017-11-05 ENCOUNTER — Encounter (HOSPITAL_COMMUNITY)
Admission: RE | Admit: 2017-11-05 | Discharge: 2017-11-05 | Disposition: A | Discharge status: Home / Self Care | Payer: PPO | Source: Ambulatory Visit | Attending: Neurosurgery | Admitting: Neurosurgery

## 2017-11-05 ENCOUNTER — Encounter (HOSPITAL_COMMUNITY): Payer: Self-pay

## 2017-11-05 DIAGNOSIS — G250 Essential tremor: Secondary | ICD-10-CM | POA: Diagnosis not present

## 2017-11-05 DIAGNOSIS — Z01818 Encounter for other preprocedural examination: Secondary | ICD-10-CM | POA: Diagnosis not present

## 2017-11-05 LAB — CBC
HCT: 36.1 % — ABNORMAL LOW (ref 39.0–52.0)
Hemoglobin: 11.3 g/dL — ABNORMAL LOW (ref 13.0–17.0)
MCH: 30.9 pg (ref 26.0–34.0)
MCHC: 31.3 g/dL (ref 30.0–36.0)
MCV: 98.6 fL (ref 78.0–100.0)
Platelets: 160 10*3/uL (ref 150–400)
RBC: 3.66 MIL/uL — ABNORMAL LOW (ref 4.22–5.81)
RDW: 14.1 % (ref 11.5–15.5)
WBC: 6.1 10*3/uL (ref 4.0–10.5)

## 2017-11-05 LAB — BASIC METABOLIC PANEL
Anion gap: 10 (ref 5–15)
BUN: 12 mg/dL (ref 8–23)
CO2: 26 mmol/L (ref 22–32)
Calcium: 9.3 mg/dL (ref 8.9–10.3)
Chloride: 102 mmol/L (ref 98–111)
Creatinine, Ser: 1.2 mg/dL (ref 0.61–1.24)
GFR calc Af Amer: >60 mL/min (ref >60)
GFR calc non Af Amer: 57 mL/min — ABNORMAL LOW (ref >60)
Glucose, Bld: 170 mg/dL — ABNORMAL HIGH (ref 70–99)
Potassium: 4.5 mmol/L (ref 3.5–5.1)
Sodium: 138 mmol/L (ref 135–145)

## 2017-11-05 LAB — SURGICAL PCR SCREEN
MRSA, PCR: NEGATIVE
Staphylococcus aureus: POSITIVE — AB

## 2017-11-05 NOTE — Progress Notes (Signed)
PCP - Dr. Nelson ChimesAmin Cardiologist - Dr. Bryan Lemmaavid Harding  Chest x-ray - 08/29/2017 EKG - 07/17/17 Stress Test - 09/28/15 ECHO - 05/16/17 Cardiac Cath - 2013  Sleep Study - Pt states having a negative sleep study done over 10 years ago.   Aspirin Instructions: Per patient, instructed by surgeons office to hold 7 days prior to surgery. Last dose was 11/02/2017.  Anesthesia review: Yes  Patient denies shortness of breath, fever, cough and chest pain at PAT appointment   Patient verbalized understanding of instructions that were given to them at the PAT appointment. Patient was also instructed that they will need to review over the PAT instructions again at home before surgery.

## 2017-11-05 NOTE — Pre-Procedure Instructions (Addendum)
[name] W Waddell  11/05/2017      [facility] - [place] - [address] [place] [id number] Phone: [phone] Fax: [phone]    Your procedure is scheduled on Tuesday July 30.  [facility] Admitting at 2:00 P.M.  Call this number if you have problems the morning of surgery:  [phone]   Remember:  Do not eat or drink after midnight.   Take these medicines the morning of surgery with A SIP OF WATER:  propranolol (INDERAL)  Primidone (mysoline) Gabapentin (neurontin) Omeprazole (prilosec) Robaxin if needed PROAIR inhaler-please bring with you the day of surgery.   7 days prior to surgery STOP taking any Aspirin(unless otherwise instructed by your surgeon), Aleve, Naproxen, Ibuprofen, Motrin, Advil, Goody's, BC's, all herbal medications, fish oil, and all vitamins     Do not wear jewelry, make-up or nail polish.  Do not wear lotions, powders, or perfumes, or deodorant.  Do not shave 48 hours prior to surgery.  Men may shave face and neck.  Do not bring valuables to the hospital.  [facility] is not responsible for any belongings or valuables.  Contacts, dentures or bridgework may not be worn into surgery.  Leave your suitcase in the car.  After surgery it may be brought to your room.  For patients admitted to the hospital, discharge time will be determined by your treatment team.  Patients discharged the day of surgery will not be allowed to drive home.   Special instructions:    [place]- Preparing For Surgery  Before surgery, you can play an important role. Because skin is not sterile, your skin needs to be as free of germs as possible. You can reduce the number of germs on your skin by washing with CHG (chlorahexidine gluconate) Soap before surgery.  CHG is an antiseptic cleaner which kills germs and bonds with the skin to continue killing germs even after washing.     Oral Hygiene is also important to reduce your risk of infection.  Remember - BRUSH YOUR TEETH THE MORNING OF SURGERY WITH YOUR REGULAR TOOTHPASTE  Please do not use if you have an allergy to CHG or antibacterial soaps. If your skin becomes reddened/irritated stop using the CHG.  Do not shave (including legs and underarms) for at least 48 hours prior to first CHG shower. It is OK to shave your face.  Please follow these instructions carefully.   1. Shower the NIGHT BEFORE SURGERY and the MORNING OF SURGERY with CHG.   2. If you chose to wash your hair, wash your hair first as usual with your normal shampoo.  3. After you shampoo, rinse your hair and body thoroughly to remove the shampoo.  4. Use CHG as you would any other liquid soap. You can apply CHG directly to the skin and wash gently with a scrungie or a clean washcloth.   5. Apply the CHG Soap to your body ONLY FROM THE NECK DOWN.  Do not use on open wounds or open sores. Avoid contact with your eyes, ears, mouth and genitals (private parts). Wash Face and genitals (private parts)  with your normal soap.  6. Wash thoroughly, paying special attention to the area where your surgery will be performed.  7. Thoroughly rinse your body with warm water from the neck down.  8. DO NOT shower/wash with your normal soap after using and rinsing off the CHG Soap.  9. Pat yourself dry with a CLEAN TOWEL.  10. Wear CLEAN PAJAMAS to bed the night before surgery, wear comfortable clothes the morning of surgery  11. Place CLEAN SHEETS on your bed the night of your first shower and DO NOT SLEEP WITH PETS.    Day of Surgery:  Do not apply any deodorants/lotions.  Please wear clean clothes to the hospital/surgery center.   Remember to brush your teeth WITH YOUR REGULAR TOOTHPASTE.    Please read over the following fact sheets that you were given. Coughing and Deep Breathing and Surgical Site Infection Prevention

## 2017-11-05 NOTE — Progress Notes (Signed)
Subjective:   Brendan [name] was seen in consultation in the movement disorder clinic at the request of Dr. [name].  His PCP is [name], MD.  The evaluation is for possible DBS for essential tremor. The records that were made available to me were reviewed. This patient is accompanied in the office by his spouse who supplements the history.  Tremor started approximately 25 years ago and involves the bilateral hands and head.   It started in the L hand but about 5 years ago he noted some tremor in the right hand.   Wife thinks that he has had head tremor for 25 years.  He is L hand dominant.   Tremor is most noticeable when he tries to eat.  Wife does note some rest tremor.   There is a family hx of tremor in paternal uncle, now deceased.    Affected by caffeine: doesn't know (doesn't drink caffeine) Affected by alcohol:  Rarely drinks so doesn't know Affected by stress:  Yes.  , per wife Affected by fatigue:  Yes.   Spills soup if on spoon:  Yes.   Spills glass of liquid if full:  Yes.   Affects ADL's (tying shoes, brushing teeth, etc):  Yes.   (does okay with teeth and shoes but trouble combing hair; shaves with electric)  Current/Previously tried tremor medications: propranolol (80 mg bid), primidone (250 mg bid), gabapentin (600 mg tid)  Current medications that may exacerbate tremor:  n/a  Outside reports reviewed: historical medical records, office notes and referral letter/letters. Reviewed patients most recent CT brain done in 2014 and there was atrophy but otherwise unremarkable.  Any other sx's: Voice: no change Sleep: trouble staying asleep (sleeps in recliner because the tremor wakes him up less)  Vivid Dreams:  No.  Acting out dreams:  No. Wet Pillows: No. Postural symptoms:  Yes.  , when first wakes up  Falls?  No. Bradykinesia symptoms: trouble getting out of chair or couch if low Loss of smell:  No. Loss of taste:  No. Urinary Incontinence:  No. Difficulty  Swallowing:  Yes.   Handwriting, micrographia: unknown - cannot write at all because of tremor Depression:  No. Memory changes:  No. Hallucinations:  No.  visual distortions: No. N/V:  No. Lightheaded:  No.  Syncope: No. Diplopia:  No. Dyskinesia:  No.  02/13/17 update: Patient presents today in follow-up for his essential tremor.  He is accompanied by his wife and daughter who supplements the history.  He had neurocognitive testing with Dr. [name] on October 26, 2016.  There is evidence of mild cognitive impairment but no evidence of dementia.  Saw Dr. [name] 01/31/17.    At that point in time, the patient was unsure if he wanted to proceed with DBS.  He was told by Dr. [name] that he would need cardiac clearance.  He did have an MRI of the brain, but the T2 images did not have proper spacing.    04/20/17 update: Patient is seen today for essential tremor for preop video and any last minute questions.  He is accompanied by his wife who supplements the history.  The patient is scheduled for fiducial placement on April 26, 2017.  He is scheduled for bilateral VIM DBS on May 04, 2017.  The patient is currently on primidone, 250 mg twice daily.  He is also on gabapentin, 600 mg 3 times per day.  06/18/17 update: Patient is seen today in follow-up for essential tremor.  He  is accompanied by his daughter who supplements the history.  Much has happened since our last visit.  Records are reviewed.  The patient was admitted to the hospital on May 04, 2017 for DBS surgery.  Postoperative images that same day indicated that the patient had pneumocephalus, but no blood was present at the time.  Postoperatively, the patient experienced confusion and had a CT of the brain demonstrating a small parenchymal hemorrhage along the left DBS lead.  The patient experienced right hemiparesis and confusion and a dysphasia.  He had another MRI of the brain on May 17, 2017 demonstrating a probable postoperative  hematoma.  The patient went to subacute nursing facility for rehab, but quickly bounced back to the hospital and subsequently ended up in inpatient rehab.  When he was admitted to inpatient rehab, strength was 0/5, he was lethargic and had significant difficulties with speech.  He was admitted to rehab from May 18, 2017 to June 09, 2017.  He made marked recovery and progress while in rehab.  When discharged, strength was 4/5 in the right upper and lower extremities and he was transferring with and without a rolling walker with minimal assist.  Overall, the patient and daughter are pleased with his progress.  He is doing very well at home and rarely uses the walker within the house.  But is not confused during the day.  He has some word finding troubles. daughter states that wakes up in the middle of the night confused.  On seroquel for sleep.  Having trouble reading - vision too blurry to read.    11/19/17 update: Patient is seen today in follow-up for essential tremor and for turning on his DBS.   Pt is accompanied by his grandson and wife who supplement the history.   He is still on primidone, 50 mg twice per day. Also on trazodone 50 mg q hs for sleep but still has some sleep trouble.  Much has happened since our last visit.  Numerous records have been reviewed.  Patient was in the hospital in April with Pseudomonas epididymitis.  He has also been treated for multiple urinary tract infections since last visit and has also been treated by pulmonary for cough.  He was finally able to have his battery placed on November 13, 2017.  Dr. Fredrich Birks nurse contacted me this morning and stated that the patient had a few falls on Friday and some confusion.  He had trouble manipulating the walker.  However, this resolved over the weekend.  When asked about this today, confusion is denied by pt/wife but stated that he had episode of "jerking" and that caused him to let go of/throw the walker and he fell because of that.   Feeling good. Wife states that he is not 100% physically back to where he was before surgery but mentally he is.  Has some word finding trouble but had that issue prior to sx per wife.  Allergies  Allergen Reactions  . Lisinopril Cough    Outpatient Encounter Medications as of 11/19/2017  Medication Sig  . ALLERGY 10 MG tablet TAKE 1 TABLET BY MOUTH DAILY  . aspirin EC 81 MG tablet Take 81 mg by mouth daily.  [name] atorvastatin (LIPITOR) 40 MG tablet Take 1 tablet (40 mg total) by mouth daily.  . baclofen (LIORESAL) 10 MG tablet Take 5-10 mg by mouth See admin instructions. Take 5 mg by mouth in the morning and 10 mg by mouth at bedtime  . furosemide (LASIX) 20  MG tablet TAKE 1 TABLET BY MOUTH DAILY, TAKE AN EXTRA DOSE IF NEEDED  . HYDROcodone-acetaminophen (NORCO/VICODIN) 5-325 MG tablet Take 1 tablet by mouth every 4 (four) hours as needed for severe pain ((score 7 to 10)).  [name] ketoconazole (NIZORAL) 2 % shampoo Apply 1 application topically 2 (two) times a week. (Patient taking differently: Apply 1 application topically once a week. )  . losartan (COZAAR) 50 MG tablet Take 1 tablet (50 mg total) by mouth daily.  . Melatonin 3 MG TABS Take 1 tablet (3 mg total) by mouth at bedtime.  . methocarbamol (ROBAXIN) 500 MG tablet TAKE 1 TABLET BY MOUTH TWICE DAILY AS NEEDED FOR MUSCLE SPASMS  . omeprazole (PRILOSEC) 20 MG capsule Take 1 capsule (20 mg total) by mouth daily as needed (acid reflux).  . primidone (MYSOLINE) 50 MG tablet Take 1 tablet (50 mg total) by mouth 2 (two) times daily.  [name] PROAIR HFA 108 (90 Base) MCG/ACT inhaler Take 2 puffs by mouth every 6 (six) hours as needed for wheezing or shortness of breath.   . propranolol (INDERAL) 40 MG tablet Take 1 tablet (40 mg total) by mouth 2 (two) times daily.  [name] Propylene Glycol (SYSTANE BALANCE) 0.6 % SOLN Place 1 drop into both eyes daily as needed (for dry eyes).  . tamsulosin (FLOMAX) 0.4 MG CAPS capsule Take 1 capsule (0.4 mg total) by mouth  daily after supper.  . traZODone (DESYREL) 50 MG tablet Take 1 tablet (50 mg total) by mouth at bedtime.  . gabapentin (NEURONTIN) 600 MG tablet Take 1 tablet (600 mg total) by mouth 3 (three) times daily.  . nitroGLYCERIN (NITROSTAT) 0.4 MG SL tablet Place 1 tablet (0.4 mg total) under the tongue every 5 (five) minutes as needed for chest pain. call 911 if chest pain not better (Patient not taking: Reported on 11/19/2017)  . [DISCONTINUED] aspirin 81 MG chewable tablet Chew 1 tablet (81 mg total) by mouth daily. (Patient not taking: Reported on 10/31/2017)  . [DISCONTINUED] benzonatate (TESSALON) 200 MG capsule Take 1 capsule (200 mg total) by mouth 3 (three) times daily as needed for cough. (Patient not taking: Reported on 10/31/2017)  . [DISCONTINUED] furosemide (LASIX) 20 MG tablet Take 20 mg by mouth daily.   Facility-Administered Encounter Medications as of 11/19/2017  Medication  . 0.9 %  sodium chloride infusion  . sodium chloride 0.9 % injection 3 mL    Past Medical History:  Diagnosis Date  . Anxiety disorder   . Benign enlargement of prostate   . CAD in native artery 1997   Referred for CABG x3 in 2004 for LAD diagonal bifurcation lesion  . Cervical spinal stenosis   . Complex regional pain syndrome of right upper extremity    Right wrist; L arm  . Coronary atherosclerosis of artery bypass graft June 2013   Occluded SVG-D1; Cardiologist Dr. Herbie Baltimore  . Gait disorder   . Gastroesophageal reflux disease   . Glaucoma   . Hiatal hernia    GI: Dr Evette Cristal  . Hyperlipidemia LDL goal <70   . Hypertension   . Memory loss    Mild  . Obesity   . Peripheral neuropathy    possible peripheral neuropathy  . S/P CABG x 3 2004    LIMA-LAD, SVG to diagonal, SVG to OM  . Tremor, essential    On Primidone  . Unstable angina pectoris [facility])  June 2013   Cardiac cath: occluded SVG-DI. Patent LIMA-LAD and SVG-OM. EF 45% with apical  inferior HK.    Past Surgical History:  Procedure Laterality  Date  . CARDIAC CATHETERIZATION    . CATARACT EXTRACTION, BILATERAL    . CORONARY ARTERY BYPASS GRAFT  06/2002   LIMA-LAD, SVG-D1, SVG-OM  . EYE SURGERY    . KNEE SURGERY Left   . LEFT HEART CATHETERIZATION WITH CORONARY/GRAFT ANGIOGRAM N/A 10/13/2011   Procedure: LEFT HEART CATHETERIZATION WITH Isabel Caprice;  Surgeon: Marykay Lex, MD;  Location: [facility] CATH LAB;  Service: Cardiovascular;  Laterality: N/A;  . MINOR PLACEMENT OF FIDUCIAL N/A 04/26/2017   Procedure: Fiducial placement;  Surgeon: Maeola Harman, MD;  Location[facility] OR;  Service: Neurosurgery;  Laterality: N/A;  Fiducial placement  . NM MYOCAR PERF WALL MOTION  03/23/2009   protocol:Bruce, normal perfusion in all regions, post-stress EF 72%, exercise capacity , EKG negative for ischemia.  [name] PULSE GENERATOR IMPLANT Bilateral 11/13/2017   Procedure: Bilateral Implantable pulse generator;  Surgeon: Maeola Harman, MD;  Location: [facility] OR;  Service: Neurosurgery;  Laterality: Bilateral;  Bilateral Implantable pulse generator  . Shoulder Orthoscopic Surgery   06/2003  . SUBTHALAMIC STIMULATOR INSERTION Bilateral 05/04/2017   Procedure: Bilateral Deep brain stimulator placement;  Surgeon: Maeola Harman, MD;  Location: [facility] OR;  Service: Neurosurgery;  Laterality: Bilateral;  Bilateral deep brain stimulator placement  . TRANSTHORACIC ECHOCARDIOGRAM  04/2017   Jan 2019: Normal LV size and function.  EF 55-60%.  No or W MA.  Mild LA dilation.  . wrist ganglion cyst Left     Social History   Socioeconomic History  . Marital status: Married    Spouse name: [name]  . Number of children: 2  . Years of education: 10  . Highest education level: Not on file  Occupational History  . Occupation: retired    Comment: Geneticist, molecular - Event organiser x 30 years  Social Needs  . Financial resource strain: Not hard at all  . Food insecurity:    Worry: Never true    Inability: Never true  . Transportation needs:    Medical: No     Non-medical: No  Tobacco Use  . Smoking status: Former Smoker    Packs/day: 3.00    Years: 20.00    Pack years: 60.00    Types: Cigarettes    Last attempt to quit: 04/17/1968    Years since quitting: 49.6  . Smokeless tobacco: Never Used  Substance and Sexual Activity  . Alcohol use: No    Frequency: Never  . Drug use: No  . Sexual activity: Never  Lifestyle  . Physical activity:    Days per week: 0 days    Minutes per session: 0 min  . Stress: Only a little  Relationships  . Social connections:    Talks on phone: More than three times a week    Gets together: Three times a week    Attends religious service: Patient refused    Active member of club or organization: Patient refused    Attends meetings of clubs or organizations: Patient refused    Relationship status: Married  . Intimate partner violence:    Fear of current or ex partner: No    Emotionally abused: No    Physically abused: No    Forced sexual activity: No  Other Topics Concern  . Not on file  Social History Narrative   Married.  Wife is Royal Piedra.   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   Former smoker, quit 20 years ago. Does not drink alcohol.  Does not get routine exercise, walks sometimes with a cane.   Caffeine use: diet-soda   Left handed     Family Status  Relation Name Status  . Mother  Deceased at age 75       CHF  . Father  Deceased at age 75       following a fall  . Sister  Alive  . Brother  Alive  . [employer] Uncle  Alive  . Brother  Alive       pt. has four brothers  . Sister  Alive  . Daughter  Alive  . Daughter  Alive  . Brother  Alive  . Brother  Alive    Review of Systems A complete 10 system ROS was obtained and was negative apart from what is mentioned.   Objective:   VITALS:   Vitals:   11/19/17 1424  BP: 114/68  Pulse: 60  SpO2: 90%  Weight: 217 lb (98.4 kg)  Height: 5\' 6"  (1.676 m)   GEN:  The patient appears stated age and is in NAD. HEENT:   Normocephalic, atraumatic.  The mucous membranes are moist. The superficial temporal arteries are without ropiness or tenderness. CV:  RRR Lungs:  CTAB Neck/HEME:  There are no carotid bruits bilaterally.  Neurological examination:  Orientation: The patient is alert and oriented x3. Cranial nerves: There is good facial symmetry. The speech is fluent and clear. Soft palate rises symmetrically and there is no tongue deviation. Hearing is intact to conversational tone. Sensation: Sensation is intact to light touch throughout Motor: Strength is 5/5 in the bilateral upper and lower extremities.   Shoulder shrug is equal and symmetric.  There is no pronator drift.  Movement examination: Tone: There is normal tone in the UE/LE Abnormal movements: prior to programming there is mild RUE postural tremor and mod to severe LUE postural tremor.  Following programming, tremor had resolved bilaterally.  He was able then to pour water from one glass to another without spilling it. Coordination:  There is no decremation with RAM's, with any form of RAMS, including alternating supination and pronation of the forearm, hand opening and closing, finger taps, heel taps and toe taps. Gait and Station: The patient pushes off of the chair to arise.  He uses the walker and is steady with that but unsteady without it    labs: Lab Results  Component Value Date   HGBA1C 5.8 (H) 05/16/2017     Chemistry      Component Value Date/Time   NA 138 11/05/2017 1137   NA 138 08/29/2017 1547   K 4.5 11/05/2017 1137   CL 102 11/05/2017 1137   CO2 26 11/05/2017 1137   BUN 12 11/05/2017 1137   BUN 12 08/29/2017 1547   CREATININE 1.20 11/05/2017 1137   CREATININE 1.02 05/01/2016 1118      Component Value Date/Time   CALCIUM 9.3 11/05/2017 1137   ALKPHOS 82 07/16/2017 1528   AST 17 07/16/2017 1528   ALT 17 07/16/2017 1528   BILITOT 1.2 07/16/2017 1528       Assessment/Plan:   1.  Essential Tremor.  -Patient is  status post bilateral DBS to the bilateral VIM on May 04, 2017.  He suffered a postoperative bleed on the left, with resultant hematoma causing initial paralysis of the right hemi-soma and then mild paresis on the right.  Because of these complications, along with multiple other infections, the patient's battery was not placed until November 13, 2017.  His device was activated today with resolution of tremor  -baclofen - 10 mg q hs.  Will likely reduce next visit  -continue primidone 50 mg bid.  Will likely wean off next time  -his seroquel has been discontinued  2.  Possible myoclonus  -on gabapentin 600 mg bid.  Reduce to 300 mg bid.  3.  F/u 1 week.   CC:  [name], MD

## 2017-11-06 NOTE — Progress Notes (Signed)
Anesthesia Chart Review:  Case:  [id number] Date/Time:  11/13/17 1550   Procedure:  Bilateral Implantable pulse generator (Bilateral ) - Bilateral Implantable pulse generator   Anesthesia type:  General   Pre-op diagnosis:  Essential tremor   Location:  [facility] ROOM 21 / [facility]   Surgeon:  Maeola HarmanStern, Joseph, MD      DISCUSSION:77yo male former smoker for above procedure. Pertinent medical hx includes HTN, COPD, Anemia, GERD, Hiatal hernia, Essential tremor, CRPS of RUE, Peripheral neuropathy, Anxiety, HLD, CAD (CABG x 3 in 1997. Unstable Angina in June 2013 - Occluded SVG-Diag w/Patent LIMA-LAD & SVG-OM treated medically. He does have chronic stable angina.), s/p Subthalamic stimulator insertion 05/04/2017 - complicated by postop hematoma causing right hemiparesis, confusion, and dysphagia requiring stay at subacute rehab facility, pt ultimately returned to near baseline function.  Cardiac clearance per Dr. Herbie BaltimoreHarding 10/09/2017: "Based upon ACC-AHA Guidelines - in the absence of active Angina or CHF symptoms, would not recommend additional ischemic evaluation as this would not alter current management. Despite all of his Non-Cardiac conditions, from a strictly Cardiac standpoint, he would be considered LOW RISK by the Revised Cardiac Risk Index."  Anticipate he can proceed with surgery as planned barring acute status change.  VS: BP (!) 127/54   Pulse 63   Temp 36.7 C   Resp 20   Ht 5' 6.5" (1.689 m)   Wt 212 lb 4.8 oz (96.3 kg)   SpO2 95%   BMI 33.75 kg/m   PROVIDERS: Freddrick MarchAmin, [name], MD Bryan LemmaHarding, David, MD is Cardiologist last seen 10/10/2017 Levy PupaByrum, Robert, MD is Pulmonologist last seen 10/11/2017 Tat, Lurena Joinerebecca, DO is Neurologist last seen 06/18/2017  LABS: Labs reviewed: Acceptable for surgery. (all labs ordered are listed, but only abnormal results are displayed)  Labs Reviewed  SURGICAL PCR SCREEN - Abnormal; Notable for the following components:      Result Value   Staphylococcus aureus  POSITIVE (*)    All other components within normal limits  BASIC METABOLIC PANEL - Abnormal; Notable for the following components:   Glucose, Bld 170 (*)    GFR calc non Af Amer 57 (*)    All other components within normal limits  CBC - Abnormal; Notable for the following components:   RBC 3.66 (*)    Hemoglobin 11.3 (*)    HCT 36.1 (*)    All other components within normal limits     IMAGES: CHEST - 2 VIEW 08/29/2017  COMPARISON:  07/16/2017  FINDINGS: Normal heart size. Status post CABG. Mildly low lung volumes with interstitial crowding. Blunting of the lateral left costophrenic sulcus and left posterior pleural thickening attributed to scarring/pleural plaques. There is also hazy opacity at the right apex from pleural plaques by 2018 chest CT. There is no edema, consolidation, effusion, or pneumothorax.  IMPRESSION: 1. No evidence of active disease. 2. Pleural plaques.    EKG: 07/17/2017: Normal sinus rhythm. Left axis deviation. Septal infarct , age undetermined  CV: ECHO 05/16/2017: Study Conclusions  - Left ventricle: The cavity size was normal. Systolic function was   normal. The estimated ejection fraction was in the range of 55%   to 60%. Wall motion was normal; there were no regional wall   motion abnormalities. Left ventricular diastolic function   parameters were normal. - Left atrium: The atrium was mildly dilated. - Atrial septum: No defect or patent foramen ovale was identified.  Lexiscan 09/28/2015:  The left ventricular ejection fraction is normal (55-65%). No wall motion abnormalities.  Nuclear stress EF: 59%.  There was no ST segment deviation noted during stress.  The study is normal. There is no ischemia identified. Normal perfusion.  This is a low risk study.  Cath 09/16/2011: Cardiac cath: occluded SVG-DI. Patent LIMA-LAD and SVG-OM. EF 45% with apical inferior HK. Left Ventriculography:  EF: 40-45%  Wall Motion: Global hypokinesis  with mid to apical inferior hypokinesis  Coronary Angiographic Data:   Left Main: Large caliber vessel, bifurcates into the LAD and Left Circumflex with a very small Ramus Intermedius.   Left Anterior Descending (LAD): Large caliber vessel with 2 small caliber proximal Diagonal branches, the native vessel then tapers off with to-and-fro flow from the LIMA graft. The distal LAD fills via the LIMA with minimal luminal irregularities.   The grafted Diagonal was not visualized.   Circumflex (LCx): Large caliber vessel, 2 small OMBs (not numbered) then bifurcates into the main OM tunck and the AV Groove Circumflex.   1st obtuse marginal: This is actually the small caliber superior branch of the OM trunk, angiographically normal.   2nd obtuse marginal: Moderate to small caliber inferior branch of the OM Trunk - to & fro flow from the SVG.   posterior lateral branch: The remainder of the AVGroove Circumflex courses around to give off a moderate caliber LPL. Angiographyically normal.   Right Coronary Artery: Moderate sized dominant vessel, SA nodal and atrial branches followed by 2 RV marginal branches. Bifurcates distally into RPDA and RPAV. Minimal luminal irregularities   posterior descending artery: Small to moderate caliber vessel, reaches ~2/3 of the way to the apex. Minimal luminal irregularities.   Posterior-atrioventricular branch: Small caliber vessel that gives off the AV Nodal artery as well as 4 small Posterolateral branches.  Grafts   LIMA - LAD: Widely patent   SVG - DIAG: Unable to find Aorto-anastomosis with direct or indirect engagement   SVG - OM: Widely patent, antegrade flow fills large bifurcating OM, retrograde flow to the native Left Circumflex fills OM1 then down the native AV Groove Cx to the LPL branches.    Past Medical History:  Diagnosis Date  . Anxiety disorder   . Benign enlargement of prostate   . CAD in native artery 1997   Referred for CABG x3 in 2004  for LAD diagonal bifurcation lesion  . Cervical spinal stenosis   . Complex regional pain syndrome of right upper extremity    Right wrist; L arm  . Coronary atherosclerosis of artery bypass graft June 2013   Occluded SVG-D1; Cardiologist Dr. Herbie Baltimore  . Gait disorder   . Gastroesophageal reflux disease   . Glaucoma   . Hiatal hernia    GI: Dr Evette Cristal  . Hyperlipidemia LDL goal <70   . Hypertension   . Memory loss    Mild  . Obesity   . Peripheral neuropathy    possible peripheral neuropathy  . S/P CABG x 3 2004    LIMA-LAD, SVG to diagonal, SVG to OM  . Tremor, essential    On Primidone  . Unstable angina pectoris [facility])  June 2013   Cardiac cath: occluded SVG-DI. Patent LIMA-LAD and SVG-OM. EF 45% with apical inferior HK.    Past Surgical History:  Procedure Laterality Date  . CARDIAC CATHETERIZATION    . CATARACT EXTRACTION, BILATERAL    . CORONARY ARTERY BYPASS GRAFT  06/2002   LIMA-LAD, SVG-D1, SVG-OM  . EYE SURGERY    . KNEE SURGERY Left   . LEFT HEART CATHETERIZATION WITH CORONARY/GRAFT  ANGIOGRAM N/A 10/13/2011   Procedure: LEFT HEART CATHETERIZATION WITH Isabel Caprice;  Surgeon: Marykay Lex, MD;  Location: [facility] CATH LAB;  Service: Cardiovascular;  Laterality: N/A;  . MINOR PLACEMENT OF FIDUCIAL N/A 04/26/2017   Procedure: Fiducial placement;  Surgeon: Maeola Harman, MD;  Location: [facility] OR;  Service: Neurosurgery;  Laterality: N/A;  Fiducial placement  . NM MYOCAR PERF WALL MOTION  03/23/2009   protocol:Bruce, normal perfusion in all regions, post-stress EF 72%, exercise capacity , EKG negative for ischemia.  . Shoulder Orthoscopic Surgery   06/2003  . SUBTHALAMIC STIMULATOR INSERTION Bilateral 05/04/2017   Procedure: Bilateral Deep brain stimulator placement;  Surgeon: Maeola Harman, MD;  Location: [facility] OR;  Service: Neurosurgery;  Laterality: Bilateral;  Bilateral deep brain stimulator placement  . TRANSTHORACIC ECHOCARDIOGRAM  04/2017   Jan 2019: Normal LV  size and function.  EF 55-60%.  No or W MA.  Mild LA dilation.  . wrist ganglion cyst Left     MEDICATIONS: . ALLERGY 10 MG tablet  . aspirin 81 MG chewable tablet  . aspirin EC 81 MG tablet  . atorvastatin (LIPITOR) 40 MG tablet  . baclofen (LIORESAL) 10 MG tablet  . benzonatate (TESSALON) 200 MG capsule  . furosemide (LASIX) 20 MG tablet  . furosemide (LASIX) 20 MG tablet  . gabapentin (NEURONTIN) 600 MG tablet  . ketoconazole (NIZORAL) 2 % shampoo  . losartan (COZAAR) 50 MG tablet  . Melatonin 3 MG TABS  . methocarbamol (ROBAXIN) 500 MG tablet  . nitroGLYCERIN (NITROSTAT) 0.4 MG SL tablet  . omeprazole (PRILOSEC) 20 MG capsule  . primidone (MYSOLINE) 50 MG tablet  . PROAIR HFA 108 (90 Base) MCG/ACT inhaler  . propranolol (INDERAL) 40 MG tablet  . Propylene Glycol (SYSTANE BALANCE) 0.6 % SOLN  . tamsulosin (FLOMAX) 0.4 MG CAPS capsule  . traZODone (DESYREL) 50 MG tablet   No current facility-administered medications for this encounter.    [name] 0.9 %  sodium chloride infusion  . sodium chloride 0.9 % injection 3 mL    [facility]annie [facility] Short Stay Center/Anesthesiology Phone [phone] 11/06/2017 12:49 PM

## 2017-11-09 ENCOUNTER — Telehealth: Payer: Self-pay | Admitting: Neurology

## 2017-11-09 NOTE — Telephone Encounter (Signed)
Not completely sure but if Dr. Arbutus Leasat would not be in the procedure, I would say continue Primidone. thanks

## 2017-11-09 NOTE — Telephone Encounter (Signed)
Patient's wife made aware.

## 2017-11-09 NOTE — Telephone Encounter (Signed)
Patient's wife Brendan [name] called and wants to know should he stop his Primidone Medication on 11/13/2017 when he has his Stimulator hooked up. Please Call. Thanks

## 2017-11-09 NOTE — Telephone Encounter (Signed)
I am unsure if he needs to stop Primidone for this procedure. Please advise.

## 2017-11-13 ENCOUNTER — Observation Stay (HOSPITAL_COMMUNITY)
Admission: RE | Admit: 2017-11-13 | Discharge: 2017-11-14 | Disposition: A | Discharge status: Home / Self Care | Payer: PPO | Source: Ambulatory Visit | Attending: Neurosurgery | Admitting: Neurosurgery

## 2017-11-13 ENCOUNTER — Encounter (HOSPITAL_COMMUNITY)
Admission: RE | Disposition: A | Discharge status: Home / Self Care | Payer: Self-pay | Source: Ambulatory Visit | Attending: Neurosurgery

## 2017-11-13 ENCOUNTER — Ambulatory Visit (HOSPITAL_COMMUNITY): Payer: PPO | Admitting: Physician Assistant

## 2017-11-13 ENCOUNTER — Encounter (HOSPITAL_COMMUNITY): Payer: Self-pay | Admitting: Certified Registered"

## 2017-11-13 ENCOUNTER — Ambulatory Visit (HOSPITAL_COMMUNITY): Payer: PPO | Admitting: Certified Registered"

## 2017-11-13 DIAGNOSIS — I2510 Atherosclerotic heart disease of native coronary artery without angina pectoris: Secondary | ICD-10-CM | POA: Diagnosis not present

## 2017-11-13 DIAGNOSIS — R2689 Other abnormalities of gait and mobility: Secondary | ICD-10-CM | POA: Insufficient documentation

## 2017-11-13 DIAGNOSIS — E785 Hyperlipidemia, unspecified: Secondary | ICD-10-CM | POA: Insufficient documentation

## 2017-11-13 DIAGNOSIS — Z87891 Personal history of nicotine dependence: Secondary | ICD-10-CM | POA: Diagnosis not present

## 2017-11-13 DIAGNOSIS — K219 Gastro-esophageal reflux disease without esophagitis: Secondary | ICD-10-CM | POA: Insufficient documentation

## 2017-11-13 DIAGNOSIS — Z79899 Other long term (current) drug therapy: Secondary | ICD-10-CM | POA: Diagnosis not present

## 2017-11-13 DIAGNOSIS — Z951 Presence of aortocoronary bypass graft: Secondary | ICD-10-CM | POA: Insufficient documentation

## 2017-11-13 DIAGNOSIS — G250 Essential tremor: Secondary | ICD-10-CM | POA: Diagnosis not present

## 2017-11-13 DIAGNOSIS — I10 Essential (primary) hypertension: Secondary | ICD-10-CM | POA: Diagnosis not present

## 2017-11-13 DIAGNOSIS — Z7982 Long term (current) use of aspirin: Secondary | ICD-10-CM | POA: Insufficient documentation

## 2017-11-13 DIAGNOSIS — R251 Tremor, unspecified: Secondary | ICD-10-CM | POA: Diagnosis present

## 2017-11-13 HISTORY — PX: PULSE GENERATOR IMPLANT: SHX5370

## 2017-11-13 SURGERY — BILATERAL PULSE GENERATOR IMPLANT
Anesthesia: General | Site: Head | Laterality: Bilateral

## 2017-11-13 MED ORDER — VANCOMYCIN HCL 1000 MG IV SOLR
INTRAVENOUS | Status: AC
  Filled 2017-11-13: qty 1000

## 2017-11-13 MED ORDER — FUROSEMIDE 20 MG PO TABS
20.0000 mg | ORAL_TABLET | Freq: Every day | ORAL | Status: DC
Start: 2017-11-13 — End: 2017-11-14
  Administered 2017-11-13: 20 mg via ORAL
  Filled 2017-11-13: qty 1

## 2017-11-13 MED ORDER — KCL IN DEXTROSE-NACL 20-5-0.45 MEQ/L-%-% IV SOLN: INTRAVENOUS | Status: DC

## 2017-11-13 MED ORDER — GLYCOPYRROLATE PF 0.2 MG/ML IJ SOSY
PREFILLED_SYRINGE | INTRAMUSCULAR | Status: AC
  Filled 2017-11-13: qty 1

## 2017-11-13 MED ORDER — BACLOFEN 5 MG HALF TABLET: 5.0000 mg | ORAL_TABLET | ORAL | Status: DC

## 2017-11-13 MED ORDER — PROPRANOLOL HCL 40 MG PO TABS
40.0000 mg | ORAL_TABLET | Freq: Two times a day (BID) | ORAL | Status: DC
  Administered 2017-11-13: 40 mg via ORAL
  Filled 2017-11-13 (×2): qty 1

## 2017-11-13 MED ORDER — OXYCODONE HCL 5 MG PO TABS: 5.0000 mg | ORAL_TABLET | ORAL | Status: DC | PRN

## 2017-11-13 MED ORDER — HYDROCODONE-ACETAMINOPHEN 5-325 MG PO TABS: 2.0000 | ORAL_TABLET | ORAL | Status: DC | PRN

## 2017-11-13 MED ORDER — ROCURONIUM BROMIDE 10 MG/ML (PF) SYRINGE
PREFILLED_SYRINGE | INTRAVENOUS | Status: DC | PRN
  Administered 2017-11-13: 50 mg via INTRAVENOUS

## 2017-11-13 MED ORDER — FENTANYL CITRATE (PF) 250 MCG/5ML IJ SOLN
INTRAMUSCULAR | Status: AC
  Filled 2017-11-13: qty 5

## 2017-11-13 MED ORDER — FENTANYL CITRATE (PF) 250 MCG/5ML IJ SOLN
INTRAMUSCULAR | Status: DC | PRN
  Administered 2017-11-13: 50 ug via INTRAVENOUS
  Administered 2017-11-13: 100 ug via INTRAVENOUS

## 2017-11-13 MED ORDER — ACETAMINOPHEN 650 MG RE SUPP: 650.0000 mg | RECTAL | Status: DC | PRN

## 2017-11-13 MED ORDER — DOCUSATE SODIUM 100 MG PO CAPS
100.0000 mg | ORAL_CAPSULE | Freq: Two times a day (BID) | ORAL | Status: DC
  Administered 2017-11-13: 100 mg via ORAL
  Filled 2017-11-13: qty 1

## 2017-11-13 MED ORDER — CHLORHEXIDINE GLUCONATE CLOTH 2 % EX PADS: 6.0000 | MEDICATED_PAD | Freq: Once | CUTANEOUS | Status: DC

## 2017-11-13 MED ORDER — PROPOFOL 10 MG/ML IV BOLUS
INTRAVENOUS | Status: DC | PRN
  Administered 2017-11-13: 160 mg via INTRAVENOUS

## 2017-11-13 MED ORDER — KETOCONAZOLE 2 % EX SHAM: 1.0000 "application " | MEDICATED_SHAMPOO | CUTANEOUS | Status: DC

## 2017-11-13 MED ORDER — ONDANSETRON HCL 4 MG PO TABS: 4.0000 mg | ORAL_TABLET | Freq: Four times a day (QID) | ORAL | Status: DC | PRN

## 2017-11-13 MED ORDER — LORATADINE 10 MG PO TABS
10.0000 mg | ORAL_TABLET | Freq: Every day | ORAL | Status: DC
  Administered 2017-11-13: 10 mg via ORAL
  Filled 2017-11-13: qty 1

## 2017-11-13 MED ORDER — 0.9 % SODIUM CHLORIDE (POUR BTL) OPTIME
TOPICAL | Status: DC | PRN
  Administered 2017-11-13: 1000 mL

## 2017-11-13 MED ORDER — FLEET ENEMA 7-19 GM/118ML RE ENEM: 1.0000 | ENEMA | Freq: Once | RECTAL | Status: DC | PRN

## 2017-11-13 MED ORDER — METHOCARBAMOL 500 MG PO TABS: 500.0000 mg | ORAL_TABLET | Freq: Four times a day (QID) | ORAL | Status: DC | PRN

## 2017-11-13 MED ORDER — GABAPENTIN 600 MG PO TABS
600.0000 mg | ORAL_TABLET | Freq: Three times a day (TID) | ORAL | Status: DC
  Administered 2017-11-13: 600 mg via ORAL
  Filled 2017-11-13: qty 1

## 2017-11-13 MED ORDER — SODIUM CHLORIDE 0.9 % IV SOLN: 250.0000 mL | INTRAVENOUS | Status: DC

## 2017-11-13 MED ORDER — VANCOMYCIN HCL 1000 MG IV SOLR
INTRAVENOUS | Status: DC | PRN
  Administered 2017-11-13: 1000 mg via TOPICAL

## 2017-11-13 MED ORDER — TRAZODONE HCL 50 MG PO TABS
50.0000 mg | ORAL_TABLET | Freq: Every day | ORAL | Status: DC
  Administered 2017-11-13: 50 mg via ORAL
  Filled 2017-11-13: qty 1

## 2017-11-13 MED ORDER — PHENOL 1.4 % MT LIQD: 1.0000 | OROMUCOSAL | Status: DC | PRN

## 2017-11-13 MED ORDER — VANCOMYCIN HCL 1000 MG IV SOLR
INTRAVENOUS | Status: DC | PRN
  Administered 2017-11-13: 1000 mg via INTRAVENOUS

## 2017-11-13 MED ORDER — DEXAMETHASONE SODIUM PHOSPHATE 10 MG/ML IJ SOLN
INTRAMUSCULAR | Status: AC
  Filled 2017-11-13: qty 1

## 2017-11-13 MED ORDER — BUPIVACAINE HCL (PF) 0.5 % IJ SOLN
INTRAMUSCULAR | Status: DC | PRN
  Administered 2017-11-13: 8.5 mL
  Administered 2017-11-13: 10 mL

## 2017-11-13 MED ORDER — SENNOSIDES-DOCUSATE SODIUM 8.6-50 MG PO TABS: 1.0000 | ORAL_TABLET | Freq: Every evening | ORAL | Status: DC | PRN

## 2017-11-13 MED ORDER — NITROGLYCERIN 0.4 MG SL SUBL: 0.4000 mg | SUBLINGUAL_TABLET | SUBLINGUAL | Status: DC | PRN

## 2017-11-13 MED ORDER — LOSARTAN POTASSIUM 50 MG PO TABS
50.0000 mg | ORAL_TABLET | Freq: Every day | ORAL | Status: DC
  Administered 2017-11-13: 50 mg via ORAL
  Filled 2017-11-13: qty 1

## 2017-11-13 MED ORDER — DEXAMETHASONE SODIUM PHOSPHATE 10 MG/ML IJ SOLN
INTRAMUSCULAR | Status: DC | PRN
  Administered 2017-11-13: 10 mg via INTRAVENOUS

## 2017-11-13 MED ORDER — ATORVASTATIN CALCIUM 20 MG PO TABS
40.0000 mg | ORAL_TABLET | Freq: Every day | ORAL | Status: DC
  Administered 2017-11-13: 40 mg via ORAL

## 2017-11-13 MED ORDER — BACITRACIN ZINC 500 UNIT/GM EX OINT
TOPICAL_OINTMENT | CUTANEOUS | Status: DC | PRN
  Administered 2017-11-13: 1 via TOPICAL

## 2017-11-13 MED ORDER — ACETAMINOPHEN 325 MG PO TABS
650.0000 mg | ORAL_TABLET | ORAL | Status: DC | PRN
  Administered 2017-11-13 – 2017-11-14 (×2): 650 mg via ORAL
  Filled 2017-11-13 (×2): qty 2

## 2017-11-13 MED ORDER — FUROSEMIDE 20 MG PO TABS: 10.0000 mg | ORAL_TABLET | Freq: Every day | ORAL | Status: DC

## 2017-11-13 MED ORDER — TAMSULOSIN HCL 0.4 MG PO CAPS
0.4000 mg | ORAL_CAPSULE | Freq: Every day | ORAL | Status: DC
  Administered 2017-11-13: 0.4 mg via ORAL
  Filled 2017-11-13: qty 1

## 2017-11-13 MED ORDER — LIDOCAINE 2% (20 MG/ML) 5 ML SYRINGE
INTRAMUSCULAR | Status: AC
  Filled 2017-11-13: qty 5

## 2017-11-13 MED ORDER — CEFAZOLIN SODIUM-DEXTROSE 2-4 GM/100ML-% IV SOLN
2.0000 g | INTRAVENOUS | Status: AC
  Administered 2017-11-13: 2 g via INTRAVENOUS
  Filled 2017-11-13: qty 100

## 2017-11-13 MED ORDER — PANTOPRAZOLE SODIUM 40 MG PO TBEC: 40.0000 mg | DELAYED_RELEASE_TABLET | Freq: Every day | ORAL | Status: DC

## 2017-11-13 MED ORDER — MELATONIN 3 MG PO TABS
3.0000 mg | ORAL_TABLET | Freq: Every day | ORAL | Status: DC
  Administered 2017-11-13: 3 mg via ORAL
  Filled 2017-11-13: qty 1

## 2017-11-13 MED ORDER — SODIUM CHLORIDE 0.9% FLUSH: 3.0000 mL | INTRAVENOUS | Status: DC | PRN

## 2017-11-13 MED ORDER — LIDOCAINE 2% (20 MG/ML) 5 ML SYRINGE
INTRAMUSCULAR | Status: DC | PRN
  Administered 2017-11-13: 40 mg via INTRAVENOUS

## 2017-11-13 MED ORDER — SUGAMMADEX SODIUM 200 MG/2ML IV SOLN
INTRAVENOUS | Status: DC | PRN
Start: 2017-11-13 — End: 2017-11-13
  Administered 2017-11-13: 200 mg via INTRAVENOUS

## 2017-11-13 MED ORDER — BISACODYL 10 MG RE SUPP: 10.0000 mg | Freq: Every day | RECTAL | Status: DC | PRN

## 2017-11-13 MED ORDER — BUPIVACAINE HCL (PF) 0.5 % IJ SOLN
INTRAMUSCULAR | Status: AC
  Filled 2017-11-13: qty 30

## 2017-11-13 MED ORDER — PRIMIDONE 50 MG PO TABS
50.0000 mg | ORAL_TABLET | Freq: Two times a day (BID) | ORAL | Status: DC
  Administered 2017-11-13: 50 mg via ORAL
  Filled 2017-11-13 (×2): qty 1

## 2017-11-13 MED ORDER — ONDANSETRON HCL 4 MG/2ML IJ SOLN
INTRAMUSCULAR | Status: DC | PRN
  Administered 2017-11-13: 4 mg via INTRAVENOUS

## 2017-11-13 MED ORDER — LACTATED RINGERS IV SOLN
INTRAVENOUS | Status: DC
Start: 2017-11-13 — End: 2017-11-13
  Administered 2017-11-13 (×2): via INTRAVENOUS

## 2017-11-13 MED ORDER — GLYCOPYRROLATE PF 0.2 MG/ML IJ SOSY
PREFILLED_SYRINGE | INTRAMUSCULAR | Status: DC | PRN
  Administered 2017-11-13: .2 mg via INTRAVENOUS

## 2017-11-13 MED ORDER — FENTANYL CITRATE (PF) 100 MCG/2ML IJ SOLN
25.0000 ug | INTRAMUSCULAR | Status: DC | PRN
Start: 2017-11-13 — End: 2017-11-13

## 2017-11-13 MED ORDER — ONDANSETRON HCL 4 MG/2ML IJ SOLN: 4.0000 mg | Freq: Four times a day (QID) | INTRAMUSCULAR | Status: DC | PRN

## 2017-11-13 MED ORDER — PROPOFOL 10 MG/ML IV BOLUS
INTRAVENOUS | Status: AC
  Filled 2017-11-13: qty 20

## 2017-11-13 MED ORDER — CEFAZOLIN SODIUM-DEXTROSE 2-4 GM/100ML-% IV SOLN
2.0000 g | Freq: Three times a day (TID) | INTRAVENOUS | Status: AC
  Administered 2017-11-13 – 2017-11-14 (×2): 2 g via INTRAVENOUS
  Filled 2017-11-13 (×2): qty 100

## 2017-11-13 MED ORDER — BACLOFEN 10 MG PO TABS
10.0000 mg | ORAL_TABLET | Freq: Every day | ORAL | Status: DC
  Administered 2017-11-13: 10 mg via ORAL
  Filled 2017-11-13: qty 1

## 2017-11-13 MED ORDER — PHENYLEPHRINE 40 MCG/ML (10ML) SYRINGE FOR IV PUSH (FOR BLOOD PRESSURE SUPPORT)
PREFILLED_SYRINGE | INTRAVENOUS | Status: AC
  Filled 2017-11-13: qty 20

## 2017-11-13 MED ORDER — SUGAMMADEX SODIUM 200 MG/2ML IV SOLN
INTRAVENOUS | Status: AC
  Filled 2017-11-13: qty 2

## 2017-11-13 MED ORDER — MENTHOL 3 MG MT LOZG: 1.0000 | LOZENGE | OROMUCOSAL | Status: DC | PRN

## 2017-11-13 MED ORDER — POLYVINYL ALCOHOL 1.4 % OP SOLN
1.0000 [drp] | Freq: Every day | OPHTHALMIC | Status: DC | PRN
  Filled 2017-11-13: qty 15

## 2017-11-13 MED ORDER — MORPHINE SULFATE (PF) 2 MG/ML IV SOLN: 2.0000 mg | INTRAVENOUS | Status: DC | PRN

## 2017-11-13 MED ORDER — LIDOCAINE-EPINEPHRINE 1 %-1:100000 IJ SOLN
INTRAMUSCULAR | Status: DC | PRN
  Administered 2017-11-13: 10 mL
  Administered 2017-11-13: 8.5 mL

## 2017-11-13 MED ORDER — LACTATED RINGERS IV SOLN: INTRAVENOUS | Status: DC

## 2017-11-13 MED ORDER — PHENYLEPHRINE 40 MCG/ML (10ML) SYRINGE FOR IV PUSH (FOR BLOOD PRESSURE SUPPORT)
PREFILLED_SYRINGE | INTRAVENOUS | Status: DC | PRN
  Administered 2017-11-13 (×5): 80 ug via INTRAVENOUS

## 2017-11-13 MED ORDER — ALBUTEROL SULFATE (2.5 MG/3ML) 0.083% IN NEBU
2.5000 mg | INHALATION_SOLUTION | Freq: Four times a day (QID) | RESPIRATORY_TRACT | Status: DC | PRN
Start: 2017-11-13 — End: 2017-11-14

## 2017-11-13 MED ORDER — BACITRACIN ZINC 500 UNIT/GM EX OINT
TOPICAL_OINTMENT | CUTANEOUS | Status: AC
  Filled 2017-11-13: qty 28.35

## 2017-11-13 MED ORDER — ONDANSETRON HCL 4 MG/2ML IJ SOLN
INTRAMUSCULAR | Status: AC
  Filled 2017-11-13: qty 2

## 2017-11-13 MED ORDER — SODIUM CHLORIDE 0.9% FLUSH: 3.0000 mL | Freq: Two times a day (BID) | INTRAVENOUS | Status: DC

## 2017-11-13 MED ORDER — LIDOCAINE-EPINEPHRINE 1 %-1:100000 IJ SOLN
INTRAMUSCULAR | Status: AC
  Filled 2017-11-13: qty 1

## 2017-11-13 MED ORDER — BACLOFEN 5 MG HALF TABLET
5.0000 mg | ORAL_TABLET | Freq: Every day | ORAL | Status: DC
  Administered 2017-11-14: 5 mg via ORAL
  Filled 2017-11-13: qty 1

## 2017-11-13 MED ORDER — VANCOMYCIN HCL IN DEXTROSE 1-5 GM/200ML-% IV SOLN
INTRAVENOUS | Status: AC
  Filled 2017-11-13: qty 200

## 2017-11-13 SURGICAL SUPPLY — 61 items
ADH SKN CLS APL DERMABOND .7 (GAUZE/BANDAGES/DRESSINGS) ×2
CABLE BIPOLOR RESECTION CORD (MISCELLANEOUS) ×4 IMPLANT
CANISTER SUCT 3000ML PPV (MISCELLANEOUS) ×2 IMPLANT
CARTRIDGE OIL MAESTRO DRILL (MISCELLANEOUS) IMPLANT
COIL EXT DBS STRETCH 40 (Lead) ×2 IMPLANT
DECANTER SPIKE VIAL GLASS SM (MISCELLANEOUS) ×2 IMPLANT
DERMABOND ADVANCED (GAUZE/BANDAGES/DRESSINGS) ×2
DERMABOND ADVANCED .7 DNX12 (GAUZE/BANDAGES/DRESSINGS) IMPLANT
DIFFUSER DRILL AIR PNEUMATIC (MISCELLANEOUS) IMPLANT
DRAPE CAMERA VIDEO/LASER (DRAPES) ×2 IMPLANT
DRAPE INCISE IOBAN 66X45 STRL (DRAPES) ×2 IMPLANT
DRAPE ORTHO SPLIT 77X108 STRL (DRAPES) ×4
DRAPE POUCH INSTRU U-SHP 10X18 (DRAPES) ×3 IMPLANT
DRAPE SURG ORHT 6 SPLT 77X108 (DRAPES) ×4 IMPLANT
DRSG OPSITE POSTOP 3X4 (GAUZE/BANDAGES/DRESSINGS) ×1 IMPLANT
DRSG OPSITE POSTOP 4X6 (GAUZE/BANDAGES/DRESSINGS) ×2 IMPLANT
DRSG TEGADERM 2-3/8X2-3/4 SM (GAUZE/BANDAGES/DRESSINGS) ×4 IMPLANT
DRSG TELFA 3X8 NADH (GAUZE/BANDAGES/DRESSINGS) ×4 IMPLANT
DURAPREP 26ML APPLICATOR (WOUND CARE) ×5 IMPLANT
ELECT CAUTERY BLADE 6.4 (BLADE) ×2 IMPLANT
ELECT REM PT RETURN 9FT ADLT (ELECTROSURGICAL) ×2
ELECTRODE REM PT RTRN 9FT ADLT (ELECTROSURGICAL) ×1 IMPLANT
GAUZE SPONGE 4X4 16PLY XRAY LF (GAUZE/BANDAGES/DRESSINGS) ×4 IMPLANT
GLOVE BIO SURGEON STRL SZ8 (GLOVE) ×6 IMPLANT
GLOVE BIOGEL PI IND STRL 8 (GLOVE) ×1 IMPLANT
GLOVE BIOGEL PI IND STRL 8.5 (GLOVE) ×2 IMPLANT
GLOVE BIOGEL PI INDICATOR 8 (GLOVE) ×2
GLOVE BIOGEL PI INDICATOR 8.5 (GLOVE) ×2
GLOVE ECLIPSE 8.0 STRL XLNG CF (GLOVE) ×5 IMPLANT
GLOVE EXAM NITRILE LRG STRL (GLOVE) IMPLANT
GLOVE EXAM NITRILE XL STR (GLOVE) IMPLANT
GLOVE EXAM NITRILE XS STR PU (GLOVE) IMPLANT
GOWN STRL REUS W/ TWL LRG LVL3 (GOWN DISPOSABLE) ×1 IMPLANT
GOWN STRL REUS W/ TWL XL LVL3 (GOWN DISPOSABLE) IMPLANT
GOWN STRL REUS W/TWL 2XL LVL3 (GOWN DISPOSABLE) ×4 IMPLANT
GOWN STRL REUS W/TWL LRG LVL3 (GOWN DISPOSABLE) ×2
GOWN STRL REUS W/TWL XL LVL3 (GOWN DISPOSABLE) ×4
KIT BASIN OR (CUSTOM PROCEDURE TRAY) ×2 IMPLANT
KIT REMOVER STAPLE SKIN (MISCELLANEOUS) ×1 IMPLANT
KIT TURNOVER KIT B (KITS) ×2 IMPLANT
NEEDLE HYPO 25X1 1.5 SAFETY (NEEDLE) ×2 IMPLANT
NEUROSTIM OCTOPOLAR ~~LOC~~ 60X55 (Neuro Prosthesis/Implant) ×2 IMPLANT
NEUROSTIM PROGRAMMER 2.2X3.7 (NEUROSURGERY SUPPLIES) ×2 IMPLANT
NS IRRIG 1000ML POUR BTL (IV SOLUTION) ×2 IMPLANT
OIL CARTRIDGE MAESTRO DRILL (MISCELLANEOUS)
PACK LAMINECTOMY NEURO (CUSTOM PROCEDURE TRAY) ×2 IMPLANT
PAD ARMBOARD 7.5X6 YLW CONV (MISCELLANEOUS) ×6 IMPLANT
PAD DRESSING TELFA 3X8 NADH (GAUZE/BANDAGES/DRESSINGS) IMPLANT
PENCIL BUTTON HOLSTER BLD 10FT (ELECTRODE) ×2 IMPLANT
STAPLER SKIN PROX WIDE 3.9 (STAPLE) ×4 IMPLANT
SUT ETHILON 3 0 PS 1 (SUTURE) ×4 IMPLANT
SUT SILK 2 0 TIES 10X30 (SUTURE) ×2 IMPLANT
SUT VIC AB 2-0 CP2 18 (SUTURE) ×4 IMPLANT
SUT VIC AB 3-0 SH 8-18 (SUTURE) ×3 IMPLANT
TAPE CLOTH SURG 4X10 WHT LF (GAUZE/BANDAGES/DRESSINGS) ×1 IMPLANT
TOOL TUNNELING (INSTRUMENTS) ×1 IMPLANT
TOWEL GREEN STERILE (TOWEL DISPOSABLE) ×3 IMPLANT
TOWEL GREEN STERILE FF (TOWEL DISPOSABLE) ×4 IMPLANT
TUBE CONNECTING 12X1/4 (SUCTIONS) ×1 IMPLANT
UNDERPAD 30X30 (UNDERPADS AND DIAPERS) ×2 IMPLANT
WATER STERILE IRR 1000ML POUR (IV SOLUTION) ×2 IMPLANT

## 2017-11-13 NOTE — Anesthesia Preprocedure Evaluation (Addendum)
Anesthesia Evaluation  Patient identified by MRN, date of birth, ID band Patient awake    Reviewed: Allergy & Precautions, NPO status , Patient's Chart, lab work & pertinent test results  Airway Mallampati: III  TM Distance: >3 FB Neck ROM: Full    Dental  (+) Edentulous Upper, Edentulous Lower   Pulmonary former smoker,     + decreased breath sounds      Cardiovascular hypertension, Pt. on medications and Pt. on home beta blockers + angina + CAD and + CABG   Rhythm:Regular Rate:Normal - Systolic murmurs    Neuro/Psych PSYCHIATRIC DISORDERS Anxiety  Neuromuscular disease    GI/Hepatic hiatal hernia, GERD  Medicated,  Endo/Other  negative endocrine ROS  Renal/GU Renal disease     Musculoskeletal   Abdominal (+) + obese,   Peds  Hematology   Anesthesia Other Findings - HLD  Reproductive/Obstetrics                            Lab Results  Component Value Date   WBC 6.1 11/05/2017   HGB 11.3 (L) 11/05/2017   HCT 36.1 (L) 11/05/2017   MCV 98.6 11/05/2017   PLT 160 11/05/2017   Lab Results  Component Value Date   CREATININE 1.20 11/05/2017   BUN 12 11/05/2017   NA 138 11/05/2017   K 4.5 11/05/2017   CL 102 11/05/2017   CO2 26 11/05/2017   Lab Results  Component Value Date   INR 1.27 07/16/2017   INR 1.05 05/15/2017    Echo: Left ventricle: The cavity size was normal. Systolic function was   normal. The estimated ejection fraction was in the range of 55% to 60%. Wall motion was normal; there were no regional wall motion abnormalities. Left ventricular diastolic function   parameters were normal. - Left atrium: The atrium was mildly dilated. - Atrial septum: No defect or patent foramen ovale was identified.  Anesthesia Physical Anesthesia Plan  ASA: III  Anesthesia Plan: General   Post-op Pain Management:    Induction: Intravenous  PONV Risk Score and Plan: 3 and  Ondansetron and Dexamethasone  Airway Management Planned: Oral ETT  Additional Equipment: None  Intra-op Plan:   Post-operative Plan: Extubation in OR  Informed Consent: I have reviewed the patients History and Physical, chart, labs and discussed the procedure including the risks, benefits and alternatives for the proposed anesthesia with the patient or authorized representative who has indicated his/her understanding and acceptance.   Dental advisory given  Plan Discussed with: CRNA  Anesthesia Plan Comments:        Anesthesia Quick Evaluation

## 2017-11-13 NOTE — Anesthesia Postprocedure Evaluation (Signed)
Anesthesia Post Note  Patient: [name] W Rozenberg  Procedure(s) Performed: Bilateral Implantable pulse generator (Bilateral Head)     Patient location during evaluation: PACU Anesthesia Type: General Level of consciousness: awake and alert Pain management: pain level controlled Vital Signs Assessment: post-procedure vital signs reviewed and stable Respiratory status: spontaneous breathing, nonlabored ventilation, respiratory function stable and patient connected to nasal cannula oxygen Cardiovascular status: blood pressure returned to baseline and stable Postop Assessment: no apparent nausea or vomiting Anesthetic complications: no    Last Vitals:  Vitals:   11/13/17 1940 11/13/17 2005  BP: (!) 154/62 (!) 163/78  Pulse: 61 (!) 58  Resp: 10 18  Temp:  36.4 C  SpO2: 98% 99%    Last Pain:  Vitals:   11/13/17 2005  TempSrc: Oral  PainSc:                  Shelton SilvasKevin D Onofre Gains

## 2017-11-13 NOTE — Interval H&P Note (Signed)
History and Physical Interval Note:  11/13/2017 3:46 PM  Brendan [name]  has presented today for surgery, with the diagnosis of Essential tremor  The various methods of treatment have been discussed with the patient and family. After consideration of risks, benefits and other options for treatment, the patient has consented to  Procedure(s) with comments: Bilateral Implantable pulse generator (Bilateral) - Bilateral Implantable pulse generator as a surgical intervention .  The patient's history has been reviewed, patient examined, no change in status, stable for surgery.  I have reviewed the patient's chart and labs.  Questions were answered to the patient's satisfaction.     [name]

## 2017-11-13 NOTE — Brief Op Note (Addendum)
11/13/2017  6:23 PM  PATIENT:  Brendan [name]  77 y.o. male  PRE-OPERATIVE DIAGNOSIS:  Essential tremor  POST-OPERATIVE DIAGNOSIS:  Essential tremor  PROCEDURE:  Procedure(s) with comments: Bilateral Implantable pulse generator (Bilateral) - Bilateral Implantable pulse generator with extensions  SURGEON:  Surgeon(s) and Role:    * Bernardina Cacho, MD - Primary  PHYSICIAN ASSISTANT:   ASSISTANTS: Poteat, RN   ANESTHESIA:   general  EBL:  40 mL   BLOOD ADMINISTERED:none  DRAINS: none   LOCAL MEDICATIONS USED:  LIDOCAINE   SPECIMEN:  No Specimen  DISPOSITION OF SPECIMEN:  N/A  COUNTS:  YES  TOURNIQUET:  * No tourniquets in log *  DICTATION: Patient has implanted bilateral VIM thalamus stimulator electrodes having  completed DBS Stage I and now presents for placement of lead extensions and IPG implantation.  PROCEDURE: Patient was brought to the operating room and GETA anesthesia was induced.  Right upper chest, scalp, neck were prepped with betadine scrub and Duraprep.  Area of planned incision was infiltrated with lidocaine.  Scalp incision was made and the lead extensions were exposed. An incision was made in the right upper chest and a pocket was created.  Extension tunnel was made from scalp to pocket.  [name] was placed and attached to lead extension, which in turn were connected to cranial lead and torqued appropriately.  Covering boots was placed.  The IPG  was placed in the pocket.  Wounds were irrigated with vancomycin.  Incisions were closed with 2-0 Vicryl and 3-0 vicryl sutures at the pocket and 2-0 vicryl at the scalp with staples. and dressed with a sterile occlusive dressing.  The patient was then repositioned and the left sided lead and IPG were placed. Left upper chest, scalp, neck were prepped with betadine scrub and Duraprep.  Area of planned incision was infiltrated with lidocaine.  Scalp incision was made and the lead extension was exposed. An incision was made  in the left upper chest and a pocket was created.  Extension tunnel was made from scalp to pocket.  [name] was placed and attached to lead extension, which in turn were connected to cranial lead and torqued appropriately.  Covering boots was placed.  The IPG  was placed in the pocket.  Wounds were irrigated with vancomycin.  Incisions were closed with 2-0 Vicryl and 3-0 vicryl sutures at the pocket and 2-0 vicryl at the scalp with staples. and dressed with a sterile occlusive dressing. Counts were correct at the end of the case.   PLAN OF CARE: Admit to inpatient   PATIENT DISPOSITION:  PACU - hemodynamically stable.   Delay start of Pharmacological VTE agent (>24hrs) due to surgical blood loss or risk of bleeding: yes  

## 2017-11-13 NOTE — Transfer of Care (Signed)
Immediate Anesthesia Transfer of Care Note  Patient: Brendan [name]  Procedure(s) Performed: Bilateral Implantable pulse generator (Bilateral Head)  Patient Location: PACU  Anesthesia Type:General  Level of Consciousness: awake and alert   Airway & Oxygen Therapy: Patient Spontanous Breathing and Patient connected to nasal cannula oxygen  Post-op Assessment: Report given to RN and Post -op Vital signs reviewed and stable  Post vital signs: Reviewed and stable  Last Vitals:  Vitals Value Taken Time  BP    Temp    Pulse    Resp    SpO2      Last Pain:  Vitals:   11/13/17 1412  TempSrc:   PainSc: 0-No pain         Complications: No apparent anesthesia complications

## 2017-11-13 NOTE — H&P (Signed)
Patient ID:   [id number] Patient: Brendan [name]  Date of Birth: 1940/07/27 Visit Type: Office Visit   Date: 10/15/2017 01:30 PM Provider: Danae Orleans. [name] MD   This 77 year old male presents for brain tumor.  HISTORY OF PRESENT ILLNESS:  1.  brain tumor  05/04/17  bilateral deep brain stimulator for tremor  Patient returns to discuss placement of DBS extensions and pulse generators.  Following his discharge from inpatient rehab, he was readmitted to [facility] in April.  By May, he was being treated for a scrotal infection, urinary retention, and upper respiratory infection.  Scrotal infection has resolved, as has respiratory infection.  He is off all antibiotics.  Catheterization is continue for urinary retention, followed by Dr. Hillis Range.  He was seen recently by his cardiologist Dr. Herbie Baltimore and pulmonologist Dr. Ortencia Kick, who are both pleased with his current status.  Patient hopes to proceed in treatment of his tremor.         Medical/Surgical/Interim History Reviewed, no change.  Last detailed document date:01/31/2017.     PAST MEDICAL HISTORY, SURGICAL HISTORY, FAMILY HISTORY, SOCIAL HISTORY AND REVIEW OF SYSTEMS I have reviewed the patient's past medical, surgical, family and social history as well as the comprehensive review of systems as included on the [facility] history form dated 01/31/2017, which I have signed.  Family History:  Reviewed, no changes.  Last detailed document date:01/31/2017.   Social History: Reviewed, no changes. Last detailed document date: 01/31/2017.    MEDICATIONS: (added, continued or stopped this visit) Started Medication Directions Instruction Stopped   Aspirin Low Dose 81 mg tablet,delayed release take 1 tablet by oral route  every day     atorvastatin 40 mg tablet take 1 tablet by oral route  every day     fish oil  ORAL      gabapentin 300 mg capsule take 1 capsule by oral route 3 times every day      loratadine 10 mg capsule      losartan 100 mg tablet take 1 tablet by oral route  every day     nitroglycerin 0.4 mg sublingual tablet place 1 tablet by sublingual route at 1st sign of attack; may repeat every 5 minutes up to 3 tabs; if norelief seek medical help     primidone 250 mg tablet take 1 tablet by oral route 3 times every day for maintenance     propanolol ER 60 mg ORAL        ALLERGIES: Ingredient Reaction Medication Name Comment  NO KNOWN ALLERGIES     No known allergies. Reviewed, no changes.    PHYSICAL EXAM:   Vitals Date Temp F BP Pulse Ht In Wt Lb BMI BSA Pain Score  10/15/2017  113/62 62 66 210 33.89  0/10      IMPRESSION:   Tremor is still present, but otherwise patient is doing well. Plan to place bilateral extensions and IPG.  He has made tremendous improvement with therapies.  PLAN:  1.) Schedule surgery to place IPG 2.) Follow-up after surgery  Orders: Instruction(s)/Education: Assessment Instruction  [phone] Dietary management education, guidance, and counseling   Completed Orders (this encounter) Order Details Reason Side Interpretation Result Initial Treatment Date Region  Dietary management education, guidance, and counseling patient encouraged to eat a well balanced diet         Assessment/Plan   # Detail Type Description   1. Assessment Essential tremor (G25.0).       2.  Assessment Body mass index (BMI) 33.0-33.9, adult (Z61.09(Z68.33).   Plan Orders Today's instructions / counseling include(s) Dietary management education, guidance, and counseling. Clinical information/comments: patient encouraged to eat a well balanced diet.         Pain Management Plan Pain Scale: 0/10. Method: Numeric Pain Intensity Scale.              Provider:  Danae OrleansJoseph D. Venetia MaxonStern MD  10/15/2017 06:24 PM Dictation edited by: Philis Kendallavid Townes Fuhs    CC Providers: Freddrick MarchYashika  Amin Ku [facility] [address] [place],  [place]  [id number]-   Lurena JoinerRebecca Tat   [address] [place], [place] [id number]             3   Electronically signed by Danae OrleansJoseph D. Venetia MaxonStern MD on 10/20/2017 12:32 PM

## 2017-11-13 NOTE — Discharge Instructions (Signed)
Wound Care Leave incision open to air. You may shower. Do not scrub directly on incision.  Do not put any creams, lotions, or ointments on incision. Activity Walk each and every day, increasing distance each day. No lifting greater than 5 lbs.   Diet Resume your normal diet.   Call Your Doctor If Any of These Occur Redness, drainage, swelling at the wound, or consistent headache Temperature greater than 101 degrees. Severe pain not relieved by pain medication. Incision starts to come apart. Follow Up Appt Call today for appointment  [phone]) or for problems.

## 2017-11-13 NOTE — Progress Notes (Signed)
Pt has 4 incisions; 2 on head right and left side and 2 on the chest right and left side. Scant drainage on both head incisions and no drainage on chest incisions.

## 2017-11-13 NOTE — Anesthesia Procedure Notes (Signed)
Procedure Name: Intubation Date/Time: 11/13/2017 4:12 PM Performed by: Elliot DallyHuggins, Merle Cirelli, CRNA Pre-anesthesia Checklist: Patient identified, Emergency Drugs available, Suction available and Patient being monitored Patient Re-evaluated:Patient Re-evaluated prior to induction Oxygen Delivery Method: Circle System Utilized Preoxygenation: Pre-oxygenation with 100% oxygen Induction Type: IV induction Ventilation: Mask ventilation without difficulty and Oral airway inserted - appropriate to patient size Laryngoscope Size: Miller and 3 Grade View: Grade I Tube type: Oral Tube size: 7.5 mm Number of attempts: 1 Airway Equipment and Method: Stylet and Oral airway Placement Confirmation: ETT inserted through vocal cords under direct vision,  positive ETCO2 and breath sounds checked- equal and bilateral Secured at: 21 cm Tube secured with: Tape Dental Injury: Teeth and Oropharynx as per pre-operative assessment

## 2017-11-13 NOTE — Op Note (Signed)
11/13/2017  6:23 PM  PATIENT:  Brendan [name]  77 y.o. male  PRE-OPERATIVE DIAGNOSIS:  Essential tremor  POST-OPERATIVE DIAGNOSIS:  Essential tremor  PROCEDURE:  Procedure(s) with comments: Bilateral Implantable pulse generator (Bilateral) - Bilateral Implantable pulse generator with extensions  SURGEON:  Surgeon(s) and Role:    Maeola Harman* Jonathan Corpus, MD - Primary  PHYSICIAN ASSISTANT:   ASSISTANTS: Poteat, RN   ANESTHESIA:   general  EBL:  40 mL   BLOOD ADMINISTERED:none  DRAINS: none   LOCAL MEDICATIONS USED:  LIDOCAINE   SPECIMEN:  No Specimen  DISPOSITION OF SPECIMEN:  N/A  COUNTS:  YES  TOURNIQUET:  * No tourniquets in log *  DICTATION: Patient has implanted bilateral VIM thalamus stimulator electrodes having  completed DBS Stage I and now presents for placement of lead extensions and IPG implantation.  PROCEDURE: Patient was brought to the operating room and GETA anesthesia was induced.  Right upper chest, scalp, neck were prepped with betadine scrub and Duraprep.  Area of planned incision was infiltrated with lidocaine.  Scalp incision was made and the lead extensions were exposed. An incision was made in the right upper chest and a pocket was created.  Extension tunnel was made from scalp to pocket.  [name] IPG was placed and attached to lead extension, which in turn were connected to cranial lead and torqued appropriately.  Covering boots was placed.  The IPG  was placed in the pocket.  Wounds were irrigated with vancomycin.  Incisions were closed with 2-0 Vicryl and 3-0 vicryl sutures at the pocket and 2-0 vicryl at the scalp with staples. and dressed with a sterile occlusive dressing.  The patient was then repositioned and the left sided lead and IPG were placed. Left upper chest, scalp, neck were prepped with betadine scrub and Duraprep.  Area of planned incision was infiltrated with lidocaine.  Scalp incision was made and the lead extension was exposed. An incision was made  in the left upper chest and a pocket was created.  Extension tunnel was made from scalp to pocket.  [name] IPG was placed and attached to lead extension, which in turn were connected to cranial lead and torqued appropriately.  Covering boots was placed.  The IPG  was placed in the pocket.  Wounds were irrigated with vancomycin.  Incisions were closed with 2-0 Vicryl and 3-0 vicryl sutures at the pocket and 2-0 vicryl at the scalp with staples. and dressed with a sterile occlusive dressing. Counts were correct at the end of the case.   PLAN OF CARE: Admit to inpatient   PATIENT DISPOSITION:  PACU - hemodynamically stable.   Delay start of Pharmacological VTE agent (>24hrs) due to surgical blood loss or risk of bleeding: yes

## 2017-11-14 ENCOUNTER — Encounter (HOSPITAL_COMMUNITY): Payer: Self-pay | Admitting: Neurosurgery

## 2017-11-14 DIAGNOSIS — G250 Essential tremor: Secondary | ICD-10-CM | POA: Diagnosis not present

## 2017-11-14 MED ORDER — HYDROCODONE-ACETAMINOPHEN 5-325 MG PO TABS: 1.0000 | ORAL_TABLET | ORAL | 0 refills | Status: DC | PRN

## 2017-11-14 NOTE — Evaluation (Addendum)
Occupational Therapy Evaluation and Discharge Patient Details Name: Brendan [name] MRN: [id number] DOB: 11/07/1940 Today's Date: 11/14/2017    History of Present Illness Pt is a 77 y.o. male s/p bilateral implantable pulse generator placement. He underwent deep brain stimulator placement 05/04/17. PMH significant for HTN, CAD with CABG x3, HLD, GERD, BPH, and anxiety.    Clinical Impression   PTA, pt had returned to modified independent level with ADL and functional mobility using RW for community mobility. He is highly motivated to maintain his independence. Pt currently requiring min guard assist for toilet and walk-in shower transfers and supervision for LB ADL. He will have 24 hour assistance available post-acute D/C. Pt and daughter educated concerning safety precautions as well as activity modifications to maximize safety in the home. Recommend pt utilize shower seat in shower once cleared to shower by MD. Pt and daughter verbalize understanding of all precautions. Pt planning to discharge this am. No further acute OT needs identified. OT will sign off.     Follow Up Recommendations  No OT follow up;Supervision/Assistance - 24 hour    Equipment Recommendations  None recommended by OT    Recommendations for Other Services       Precautions / Restrictions Precautions Precautions: Fall Precaution Comments: some instability on feet Restrictions Weight Bearing Restrictions: No      Mobility Bed Mobility               General bed mobility comments: Seated at EOB on my arrival.   Transfers Overall transfer level: Needs assistance Equipment used: None Transfers: Sit to/from Stand Sit to Stand: Min guard         General transfer comment: Min guard assist for stability with dynamic tasks.     Balance Overall balance assessment: Needs assistance Sitting-balance support: No upper extremity supported;Feet supported Sitting balance-Leahy Scale: Good     Standing  balance support: No upper extremity supported;Single extremity supported;During functional activity Standing balance-Leahy Scale: Fair Standing balance comment: Min guard assist for safety.                            ADL either performed or assessed with clinical judgement   ADL Overall ADL's : Needs assistance/impaired Eating/Feeding: Set up;Sitting   Grooming: Supervision/safety;Standing   Upper Body Bathing: Set up;Sitting   Lower Body Bathing: Supervison/ safety;Sit to/from stand   Upper Body Dressing : Set up;Standing   Lower Body Dressing: Supervision/safety;Sit to/from stand   Toilet Transfer: Min guard;Ambulation Toilet Transfer Details (indicate cue type and reason): simulated with sit<>stand followed by functional mobility in room and hallway Toileting- Clothing Manipulation and Hygiene: Min guard;Sit to/from stand   Tub/ Shower Transfer: Min guard;Ambulation;Shower seat;Grab bars   Functional mobility during ADLs: Min guard General ADL Comments: Pt and daughter instructed concerning compensatory strategies as well as safety precautions for home. Pt requiring overall min guard assist for ADL transfers.      Vision Patient Visual Report: No change from baseline Vision Assessment?: No apparent visual deficits     Perception     Praxis      Pertinent Vitals/Pain Pain Assessment: No/denies pain     Hand Dominance     Extremity/Trunk Assessment Upper Extremity Assessment Upper Extremity Assessment: RUE deficits/detail;LUE deficits/detail RUE Deficits / Details: Tremor present but AROM/strength functional.  RUE Coordination: decreased fine motor(but functional for buttons) LUE Deficits / Details: Tremor present but AROM/strength functional.  LUE Coordination: decreased fine motor(but functional  for buttons)   Lower Extremity Assessment Lower Extremity Assessment: Defer to PT evaluation       Communication Communication Communication: No  difficulties   Cognition Arousal/Alertness: Awake/alert Behavior During Therapy: WFL for tasks assessed/performed Overall Cognitive Status: Within Functional Limits for tasks assessed                                 General Comments: Some difficulty with word finding but overall approrpiate for following commands, attention, and safety.    General Comments  Pt's daughter present during session. Noted some presence of tremor but not impacting functional and fine motor tasks today.     Exercises     Shoulder Instructions      Home Living Family/patient expects to be discharged to:: Private residence Living Arrangements: Spouse/significant other Available Help at Discharge: Family;Available 24 hours/day Type of Home: House Home Access: Stairs to enter [employer] of Steps: 5 Entrance Stairs-Rails: Right;Left Home Layout: One level     Bathroom Shower/Tub: Producer, television/film/video: ("one of them is taller")     Home Equipment: Environmental consultant - 2 wheels;Cane - single point;Shower seat;Bedside commode          Prior Functioning/Environment Level of Independence: Needs assistance  Gait / Transfers Assistance Needed: Uses walker for community mobility ADL's / Homemaking Assistance Needed: Independent with ADL            OT Problem List: Decreased activity tolerance;Impaired balance (sitting and/or standing);Decreased coordination      OT Treatment/Interventions:      OT Goals(Current goals can be found in the care plan section) Acute Rehab OT Goals Patient Stated Goal: "to go home today and get tremors controlled" OT Goal Formulation: With patient  OT Frequency:     Barriers to D/C:            Co-evaluation              AM-PAC PT "6 Clicks" Daily Activity     Outcome Measure Help from another person eating meals?: None Help from another person taking care of personal grooming?: None Help from another person toileting, which  includes using toliet, bedpan, or urinal?: A Little Help from another person bathing (including washing, rinsing, drying)?: A Little Help from another person to put on and taking off regular upper body clothing?: None Help from another person to put on and taking off regular lower body clothing?: A Little 6 Click Score: 21   End of Session Equipment Utilized During Treatment: Gait belt;Rolling walker Nurse Communication: Mobility status  Activity Tolerance: Patient tolerated treatment well Patient left: in bed;with call bell/phone within reach;with family/visitor present(seated at EOB)  OT Visit Diagnosis: Other abnormalities of gait and mobility (R26.89)                Time: 1610-9604 OT Time Calculation (min): 31 min Charges:  OT General Charges $OT Visit: 1 Visit OT Evaluation $OT Eval Moderate Complexity: 1 Mod OT Treatments $Self Care/Home Management : 8-22 mins  Doristine Section, MS OTR/L  Pager: [phone]   Raphael Espe A Alonso Gapinski 11/14/2017, 9:11 AM

## 2017-11-14 NOTE — Care Management CC44 (Signed)
Condition Code 44 Documentation Completed  Patient Details  Name: [name] W Winchell MRN: [id number] Date of Birth: 08/30/1940   Condition Code 44 given:  Yes Patient signature on Condition Code 44 notice:  Yes Documentation of 2 MD's agreement:  Yes Code 44 added to claim:  Yes    Durenda GuthrieBrady, Takeila Thayne Naomi, RN 11/14/2017, 8:50 AM

## 2017-11-14 NOTE — Discharge Summary (Signed)
Physician Discharge Summary  Patient ID: Brendan [name] MRN: [id number] DOB/AGE: 75-Jun-1942 75 y.o.  Admit date: 11/13/2017   Discharge date: 11/14/2017  Admission Diagnoses:Essential tremor    Discharge Diagnoses: Essential tremor s/p Bilateral Implantable pulse generator (Bilateral) - Bilateral Implantable pulse generator with extensions     Active Problems:   Tremor   Discharged Condition: good  Hospital Course: Brendan [name] was admitted for surgery with dx essential tremor. Following placement of implanted pulse generators and extensions, pt recovered nicely and transferred to [facility] for nursing observation. He is doing well.   Consults: None  Significant Diagnostic Studies: n/a  Treatments: surgery: Bilateral Implantable pulse generator (Bilateral) - Bilateral Implantable pulse generator with extensions    Discharge Exam: Blood pressure 119/64, pulse 66, temperature 97.9 F (36.6 C), temperature source Oral, resp. rate 18, height 5' 6.5" (1.689 m), weight 96.2 kg (212 lb), SpO2 94 %. Alert, conversant. Daughter present and attentive. Pt reports minimal scalp pain overnight. Incisions without erythema, swelling, or drainage. IPG sites bilat chest without swelling or fluid collection, nontender.    Disposition:  Discharge to home. Office f/u for wound check next week, with staple removal in 2 weeks. Ok to remove chest drsgs tomorrow. Scalp drsgs difficult to adhere - family will keep scalp clean and dry, applying antibiotic ointment next two days, leaving clean and dry on third day. Norco 5/325 for prn home use.       Allergies as of 11/14/2017      Reactions   Lisinopril Cough      Medication List    TAKE these medications   ALLERGY 10 MG tablet Generic drug:  loratadine TAKE 1 TABLET BY MOUTH DAILY   aspirin EC 81 MG tablet Take 81 mg by mouth daily.   aspirin 81 MG chewable tablet Chew 1 tablet (81 mg total) by mouth daily.    atorvastatin 40 MG tablet Commonly known as:  LIPITOR Take 1 tablet (40 mg total) by mouth daily.   baclofen 10 MG tablet Commonly known as:  LIORESAL Take 5-10 mg by mouth See admin instructions. Take 5 mg by mouth in the morning and 10 mg by mouth at bedtime   benzonatate 200 MG capsule Commonly known as:  TESSALON Take 1 capsule (200 mg total) by mouth 3 (three) times daily as needed for cough.   furosemide 20 MG tablet Commonly known as:  LASIX Take 20 mg by mouth daily.   furosemide 20 MG tablet Commonly known as:  LASIX TAKE 1 TABLET BY MOUTH DAILY, TAKE AN EXTRA DOSE IF NEEDED   gabapentin 600 MG tablet Commonly known as:  NEURONTIN Take 1 tablet (600 mg total) by mouth 3 (three) times daily.   HYDROcodone-acetaminophen 5-325 MG tablet Commonly known as:  NORCO/VICODIN Take 1 tablet by mouth every 4 (four) hours as needed for severe pain ((score 7 to 10)).   ketoconazole 2 % shampoo Commonly known as:  NIZORAL Apply 1 application topically 2 (two) times a week. What changed:  when to take this   losartan 50 MG tablet Commonly known as:  COZAAR Take 1 tablet (50 mg total) by mouth daily.   Melatonin 3 MG Tabs Take 1 tablet (3 mg total) by mouth at bedtime.   methocarbamol 500 MG tablet Commonly known as:  ROBAXIN TAKE 1 TABLET BY MOUTH TWICE DAILY AS NEEDED FOR MUSCLE SPASMS   nitroGLYCERIN 0.4 MG SL tablet Commonly known as:  NITROSTAT Place 1 tablet (0.4 mg total) under the  tongue every 5 (five) minutes as needed for chest pain. call 911 if chest pain not better   omeprazole 20 MG capsule Commonly known as:  PRILOSEC Take 1 capsule (20 mg total) by mouth daily as needed (acid reflux).   primidone 50 MG tablet Commonly known as:  MYSOLINE Take 1 tablet (50 mg total) by mouth 2 (two) times daily.   PROAIR HFA 108 (90 Base) MCG/ACT inhaler Generic drug:  albuterol Take 2 puffs by mouth every 6 (six) hours as needed for wheezing or shortness of  breath.   propranolol 40 MG tablet Commonly known as:  INDERAL Take 1 tablet (40 mg total) by mouth 2 (two) times daily.   SYSTANE BALANCE 0.6 % Soln Generic drug:  Propylene Glycol Place 1 drop into both eyes daily as needed (for dry eyes).   tamsulosin 0.4 MG Caps capsule Commonly known as:  FLOMAX Take 1 capsule (0.4 mg total) by mouth daily after supper.   traZODone 50 MG tablet Commonly known as:  DESYREL Take 1 tablet (50 mg total) by mouth at bedtime.      Follow-up Information    Maeola HarmanStern, Dasean Brow, MD Follow up.   Specialty:  Neurosurgery Contact information: [address] [place] [address] [phone]           Signed: Dorian HeckleSTERN,Kathyrn Warmuth D, MD 11/14/2017, 8:22 AM

## 2017-11-14 NOTE — Progress Notes (Addendum)
Subjective: Patient reports "Karn CassisHey, Brian! I'm doing good"  Objective: Vital signs in last 24 hours: Temp:  [97.3 F (36.3 C)-98.3 F (36.8 C)] 97.9 F (36.6 C) (07/31 0358) Pulse Rate:  [56-66] 66 (07/31 0358) Resp:  [10-18] 18 (07/31 0358) BP: (119-163)/(59-78) 119/64 (07/31 0358) SpO2:  [94 %-99 %] 94 % (07/31 0358) Weight:  [96.2 kg (212 lb)] 96.2 kg (212 lb) (07/30 1359)  Intake/Output from previous day: 07/30 0701 - 07/31 0700 In: 1300 [I.V.:1000; IV Piggyback:300] Out: 40 [Blood:40] Intake/Output this shift: No intake/output data recorded.  Alert, conversant. Daughter present and attentive. Pt reports minimal scalp pain overnight. Incisions without erythema, swelling, or drainage. IPG sites bilat chest without swelling or fluid collection, nontender.   Lab Results: No results for input(s): WBC, HGB, HCT, PLT in the last 72 hours. BMET No results for input(s): NA, K, CL, CO2, GLUCOSE, BUN, CREATININE, CALCIUM in the last 72 hours.  Studies/Results: No results found.  Assessment/Plan: Stable  LOS: 1 day  Per DrStern, d/c to home. Office f/u for wound check next week, with staple removal in 2 weeks. Ok to remove chest drsgs tomorrow. Scalp drsgs difficult to adhere - family will keep scalp clean and dry, applying antibiotic ointment next two days, leaving clean and dry on third day. Norco 5/325 for prn home use.    Georgiann Cockeroteat, Brian 11/14/2017, 7:47 AM    Patient is progressing well.  Discharge home.

## 2017-11-14 NOTE — Progress Notes (Signed)
Patient alert and oriented, mae's well, voiding adequate amount of urine, swallowing without difficulty, no c/o pain or headache at time of discharge. Patient discharged home with family. Script and discharged instructions given to patient. Patient and family stated understanding of instructions given. Patient has an appointment with Dr. Venetia MaxonStern

## 2017-11-14 NOTE — Evaluation (Signed)
Physical Therapy Evaluation and Discharge Patient Details Name: Brendan [name] MRN: [id number] DOB: 30-Oct-1940 Today's Date: 11/14/2017   History of Present Illness  Pt is a 77 y.o. male s/p bilateral implantable pulse generator placement. He underwent deep brain stimulator placement 05/04/17. PMH significant for HTN, CAD with CABG x3, HLD, GERD, BPH, and anxiety.   Clinical Impression  Patient evaluated by Physical Therapy with no further acute PT needs identified. All education has been completed and the patient has no further questions. At the time of PT eval pt was able to perform transfers and ambulation with gross supervision for safety. Min guard assist provided initially for stair training however pt progressed to supervision by end of session as well. Pt and daughter were educated on safety with activity progression, car transfer, and general precautions. Pt reports he is anticipating return home this morning. See below for any follow-up Physical Therapy or equipment needs. PT is signing off. Thank you for this referral.     Follow Up Recommendations No PT follow up;Supervision for mobility/OOB    Equipment Recommendations  None recommended by PT    Recommendations for Other Services       Precautions / Restrictions Precautions Precautions: Fall Precaution Comments: some instability on feet Restrictions Weight Bearing Restrictions: No      Mobility  Bed Mobility               General bed mobility comments: Seated at EOB on my arrival.   Transfers Overall transfer level: Needs assistance Equipment used: None Transfers: Sit to/from Stand Sit to Stand: Supervision         General transfer comment: Supervision for safety. No hands on assist required for sit<>stand.   Ambulation/Gait Ambulation/Gait assistance: Supervision Gait Distance (Feet): 400 Feet Assistive device: None Gait Pattern/deviations: Step-through pattern;Decreased stride length;Drifts  right/left Gait velocity: Decreased Gait velocity interpretation: 1.31 - 2.62 ft/sec, indicative of limited community ambulator General Gait Details: Minor drifting in hallway with mild unsteadiness noted however no assist required to recover. Light supervision provided for safety throughout gait training.   Stairs Stairs: Yes Stairs assistance: Min guard;Supervision Stair Management: Two rails;Step to pattern;Forwards Number of Stairs: 10 General stair comments: VC's for sequencing and general safety. Hands-on guarding provided initially with progression to supervision by end of stair training.   Wheelchair Mobility    Modified Rankin (Stroke Patients Only)       Balance Overall balance assessment: Needs assistance Sitting-balance support: No upper extremity supported;Feet supported Sitting balance-Leahy Scale: Good     Standing balance support: No upper extremity supported;Single extremity supported;During functional activity Standing balance-Leahy Scale: Fair Standing balance comment: Min guard assist for safety.                              Pertinent Vitals/Pain Pain Assessment: No/denies pain    Home Living Family/patient expects to be discharged to:: Private residence Living Arrangements: Spouse/significant other Available Help at Discharge: Family;Available 24 hours/day Type of Home: House Home Access: Stairs to enter Entrance Stairs-Rails: Doctor, general practice of Steps: 5 Home Layout: One level Home Equipment: Walker - 2 wheels;Cane - single point;Shower seat;Bedside commode Additional Comments: Per dtr, pt was admitted to [facility] for a few days in January. Prior to admission, he was "furniture walking" in home, with supervision-Min A from spouse for self feeding and ADLs due to tremors    Prior Function Level of Independence: Needs assistance   Gait /  Transfers Assistance Needed: Uses walker for community mobility  ADL's / Homemaking  Assistance Needed: Independent with ADL        Hand Dominance   Dominant Hand: Left    Extremity/Trunk Assessment   Upper Extremity Assessment Upper Extremity Assessment: Defer to OT evaluation RUE Deficits / Details: Tremor present but AROM/strength functional.  RUE Coordination: decreased fine motor(but functional for buttons) LUE Deficits / Details: Tremor present but AROM/strength functional.  LUE Coordination: decreased fine motor(but functional for buttons)    Lower Extremity Assessment Lower Extremity Assessment: Generalized weakness(But generally functional for ambulation/stairs)    Cervical / Trunk Assessment Cervical / Trunk Assessment: Other exceptions Cervical / Trunk Exceptions: Forward head posture with rounded shoulders  Communication   Communication: No difficulties  Cognition Arousal/Alertness: Awake/alert Behavior During Therapy: WFL for tasks assessed/performed Overall Cognitive Status: Within Functional Limits for tasks assessed                                 General Comments: Some difficulty with word finding but overall approrpiate for following commands, attention, and safety.       General Comments General comments (skin integrity, edema, etc.): Pt's daughter present during session. Noted some presence of tremor but not impacting functional and fine motor tasks today.     Exercises     Assessment/Plan    PT Assessment Patent does not need any further PT services  PT Problem List         PT Treatment Interventions      PT Goals (Current goals can be found in the Care Plan section)  Acute Rehab PT Goals Patient Stated Goal: "to go home today and get tremors controlled" PT Goal Formulation: All assessment and education complete, DC therapy    Frequency     Barriers to discharge        Co-evaluation               AM-PAC PT "6 Clicks" Daily Activity  Outcome Measure Difficulty turning over in bed (including  adjusting bedclothes, sheets and blankets)?: None Difficulty moving from lying on back to sitting on the side of the bed? : A Little Difficulty sitting down on and standing up from a chair with arms (e.g., wheelchair, bedside commode, etc,.)?: A Little Help needed moving to and from a bed to chair (including a wheelchair)?: A Little Help needed walking in hospital room?: A Little Help needed climbing 3-5 steps with a railing? : A Little 6 Click Score: 19    End of Session Equipment Utilized During Treatment: Gait belt Activity Tolerance: Patient tolerated treatment well Patient left: with call bell/phone within reach;with family/visitor present(Sitting EOB) Nurse Communication: Mobility status PT Visit Diagnosis: Unsteadiness on feet (R26.81);Pain Pain - part of body: (head)    Time: [id number] PT Time Calculation (min) (ACUTE ONLY): 11 min   Charges:   PT Evaluation $PT Eval Moderate Complexity: 1 Mod          Conni SlipperLaura Dunkleberger, PT, DPT Acute Rehabilitation Services Pager: [phone]   Marylynn PearsonLaura D Mumme 11/14/2017, 10:26 AM

## 2017-11-19 ENCOUNTER — Encounter: Payer: Self-pay | Admitting: Neurology

## 2017-11-19 ENCOUNTER — Ambulatory Visit (INDEPENDENT_AMBULATORY_CARE_PROVIDER_SITE_OTHER): Payer: PPO | Admitting: Neurology

## 2017-11-19 VITALS — BP 114/68 | HR 60 | Ht 66.0 in | Wt 217.0 lb

## 2017-11-19 DIAGNOSIS — G253 Myoclonus: Secondary | ICD-10-CM | POA: Diagnosis not present

## 2017-11-19 DIAGNOSIS — Z9689 Presence of other specified functional implants: Secondary | ICD-10-CM

## 2017-11-19 DIAGNOSIS — G250 Essential tremor: Secondary | ICD-10-CM

## 2017-11-19 NOTE — Procedures (Signed)
DBS Programming was performed.    Type of device:  [employer]onic  Total time spent programming was 25 minutes.  Device was turned on.  Soft start was confirmed to be on.  Impedences were checked and were within normal limits.  Battery was checked and was determined to be functioning normally and not near the end of life.  Final settings were as follows:  Left brain electrode:     1-0+           ; Amplitude  1.5   V   ; Pulse width 90 microseconds;   Frequency   160   Hz.  Right brain electrode:     1-0+          ; Amplitude   1.5  V ;  Pulse width 90  microseconds;  Frequency   160    Hz.  Pt/wife/grandson shown how to use patient controller and demonstrated proficiency and understanding.

## 2017-11-23 NOTE — Progress Notes (Signed)
Subjective:   Brendan [name] was seen in consultation in the movement disorder clinic at the request of Dr. [name].  His PCP is Brendan March, MD.  The evaluation is for possible DBS for essential tremor. The records that were made available to me were reviewed. This patient is accompanied in the office by his spouse who supplements the history.  Tremor started approximately 25 years ago and involves the bilateral hands and head.   It started in the L hand but about 5 years ago he noted some tremor in the right hand.   Wife thinks that he has had head tremor for 25 years.  He is L hand dominant.   Tremor is most noticeable when he tries to eat.  Wife does note some rest tremor.   There is a family hx of tremor in paternal uncle, now deceased.    Affected by caffeine: doesn't know (doesn't drink caffeine) Affected by alcohol:  Rarely drinks so doesn't know Affected by stress:  Yes.  , per wife Affected by fatigue:  Yes.   Spills soup if on spoon:  Yes.   Spills glass of liquid if full:  Yes.   Affects ADL's (tying shoes, brushing teeth, etc):  Yes.   (does okay with teeth and shoes but trouble combing hair; shaves with electric)  Current/Previously tried tremor medications: propranolol (80 mg bid), primidone (250 mg bid), gabapentin (600 mg tid)  Current medications that may exacerbate tremor:  n/a  Outside reports reviewed: historical medical records, office notes and referral letter/letters. Reviewed patients most recent CT brain done in 2014 and there was atrophy but otherwise unremarkable.  Any other sx's: Voice: no change Sleep: trouble staying asleep (sleeps in recliner because the tremor wakes him up less)  Vivid Dreams:  No.  Acting out dreams:  No. Wet Pillows: No. Postural symptoms:  Yes.  , when first wakes up  Falls?  No. Bradykinesia symptoms: trouble getting out of chair or couch if low Loss of smell:  No. Loss of taste:  No. Urinary Incontinence:  No. Difficulty  Swallowing:  Yes.   Handwriting, micrographia: unknown - cannot write at all because of tremor Depression:  No. Memory changes:  No. Hallucinations:  No.  visual distortions: No. N/V:  No. Lightheaded:  No.  Syncope: No. Diplopia:  No. Dyskinesia:  No.  02/13/17 update: Patient presents today in follow-up for his essential tremor.  He is accompanied by his wife and daughter who supplements the history.  He had neurocognitive testing with Dr. Alinda [name] on October 26, 2016.  There is evidence of mild cognitive impairment but no evidence of dementia.  Saw Dr. Venetia [name] 01/31/17.    At that point in time, the patient was unsure if he wanted to proceed with DBS.  He was told by Dr. Venetia [name] that he would need cardiac clearance.  He did have an MRI of the brain, but the T2 images did not have proper spacing.    04/20/17 update: Patient is seen today for essential tremor for preop video and any last minute questions.  He is accompanied by his wife who supplements the history.  The patient is scheduled for fiducial placement on April 26, 2017.  He is scheduled for bilateral VIM DBS on May 04, 2017.  The patient is currently on primidone, 250 mg twice daily.  He is also on gabapentin, 600 mg 3 times per day.  06/18/17 update: Patient is seen today in follow-up for essential tremor.  He  is accompanied by his daughter who supplements the history.  Much has happened since our last visit.  Records are reviewed.  The patient was admitted to the hospital on May 04, 2017 for DBS surgery.  Postoperative images that same day indicated that the patient had pneumocephalus, but no blood was present at the time.  Postoperatively, the patient experienced confusion and had a CT of the brain demonstrating a small parenchymal hemorrhage along the left DBS lead.  The patient experienced right hemiparesis and confusion and a dysphasia.  He had another MRI of the brain on May 17, 2017 demonstrating a probable postoperative  hematoma.  The patient went to subacute nursing facility for rehab, but quickly bounced back to the hospital and subsequently ended up in inpatient rehab.  When he was admitted to inpatient rehab, strength was 0/5, he was lethargic and had significant difficulties with speech.  He was admitted to rehab from May 18, 2017 to June 09, 2017.  He made marked recovery and progress while in rehab.  When discharged, strength was 4/5 in the right upper and lower extremities and he was transferring with and without a rolling walker with minimal assist.  Overall, the patient and daughter are pleased with his progress.  He is doing very well at home and rarely uses the walker within the house.  But is not confused during the day.  He has some word finding troubles. daughter states that wakes up in the middle of the night confused.  On seroquel for sleep.  Having trouble reading - vision too blurry to read.    11/19/17 update: Patient is seen today in follow-up for essential tremor and for turning on his DBS.   Pt is accompanied by his grandson and wife who supplement the history.   He is still on primidone, 50 mg twice per day. Also on trazodone 50 mg q hs for sleep but still has some sleep trouble.  Much has happened since our last visit.  Numerous records have been reviewed.  Patient was in the hospital in April with Pseudomonas epididymitis.  He has also been treated for multiple urinary tract infections since last visit and has also been treated by pulmonary for cough.  He was finally able to have his battery placed on November 13, 2017.  Dr. Fredrich BirksStern's nurse contacted me this morning and stated that the patient had a few falls on Friday and some confusion.  He had trouble manipulating the walker.  However, this resolved over the weekend.  When asked about this today, confusion is denied by pt/wife but stated that he had episode of "jerking" and that caused him to let go of/throw the walker and he fell because of that.   Feeling good. Wife states that he is not 100% physically back to where he was before surgery but mentally he is.  Has some word finding trouble but had that issue prior to sx per wife.  11/26/17 update: Patient is seen today for follow-up essential tremor, status post activation of DBS device 1 week ago.  Patient is seen accompanied by his wife who supplements the history.  He remains on primidone, 50 mg twice per day.  He reports that tremor has been improved over the last week but still present.  Wife is not sure that the stimulator is really helping, but the patient states it is some.  Tremor seems to come and go.  No falls since last visit.  Saw Dr. Venetia MaxonStern and he will take staples  out this Friday.  Wife asks me to show her how to use patient programmer again.  She also asks me to look at his left foot as he felt quite some time ago (prior to last visit) and has a wound on the foot.  Allergies  Allergen Reactions  . Lisinopril Cough    Outpatient Encounter Medications as of 11/26/2017  Medication Sig  . ALLERGY 10 MG tablet TAKE 1 TABLET BY MOUTH DAILY  . aspirin EC 81 MG tablet Take 81 mg by mouth daily.  [name] atorvastatin (LIPITOR) 40 MG tablet Take 1 tablet (40 mg total) by mouth daily.  . baclofen (LIORESAL) 10 MG tablet Take 5-10 mg by mouth See admin instructions. Take 5 mg by mouth in the morning and 10 mg by mouth at bedtime  . furosemide (LASIX) 20 MG tablet TAKE 1 TABLET BY MOUTH DAILY, TAKE AN EXTRA DOSE IF NEEDED  . HYDROcodone-acetaminophen (NORCO/VICODIN) 5-325 MG tablet Take 1 tablet by mouth every 4 (four) hours as needed for severe pain ((score 7 to 10)).  [name] ketoconazole (NIZORAL) 2 % shampoo Apply 1 application topically 2 (two) times a week. (Patient taking differently: Apply 1 application topically once a week. )  . losartan (COZAAR) 50 MG tablet Take 1 tablet (50 mg total) by mouth daily.  . Melatonin 3 MG TABS Take 1 tablet (3 mg total) by mouth at bedtime.  . methocarbamol  (ROBAXIN) 500 MG tablet TAKE 1 TABLET BY MOUTH TWICE DAILY AS NEEDED FOR MUSCLE SPASMS  . nitroGLYCERIN (NITROSTAT) 0.4 MG SL tablet Place 1 tablet (0.4 mg total) under the tongue every 5 (five) minutes as needed for chest pain. call 911 if chest pain not better  . omeprazole (PRILOSEC) 20 MG capsule Take 1 capsule (20 mg total) by mouth daily as needed (acid reflux).  . primidone (MYSOLINE) 50 MG tablet Take 1 tablet (50 mg total) by mouth 2 (two) times daily.  [name] PROAIR HFA 108 (90 Base) MCG/ACT inhaler Take 2 puffs by mouth every 6 (six) hours as needed for wheezing or shortness of breath.   . propranolol (INDERAL) 40 MG tablet Take 1 tablet (40 mg total) by mouth 2 (two) times daily.  [name] Propylene Glycol (SYSTANE BALANCE) 0.6 % SOLN Place 1 drop into both eyes daily as needed (for dry eyes).  . tamsulosin (FLOMAX) 0.4 MG CAPS capsule Take 1 capsule (0.4 mg total) by mouth daily after supper.  . traZODone (DESYREL) 50 MG tablet Take 1 tablet (50 mg total) by mouth at bedtime.  . gabapentin (NEURONTIN) 600 MG tablet Take 1 tablet (600 mg total) by mouth 3 (three) times daily.   Facility-Administered Encounter Medications as of 11/26/2017  Medication  . 0.9 %  sodium chloride infusion  . sodium chloride 0.9 % injection 3 mL    Past Medical History:  Diagnosis Date  . Anxiety disorder   . Benign enlargement of prostate   . CAD in native artery 1997   Referred for CABG x3 in 2004 for LAD diagonal bifurcation lesion  . Cervical spinal stenosis   . Complex regional pain syndrome of right upper extremity    Right wrist; L arm  . Coronary atherosclerosis of artery bypass graft June 2013   Occluded SVG-D1; Cardiologist Dr. Herbie Baltimore  . Gait disorder   . Gastroesophageal reflux disease   . Glaucoma   . Hiatal hernia    GI: Dr Evette Cristal  . Hyperlipidemia LDL goal <70   . Hypertension   .  Memory loss    Mild  . Obesity   . Peripheral neuropathy    possible peripheral neuropathy  . S/P CABG x 3  2004    LIMA-LAD, SVG to diagonal, SVG to OM  . Tremor, essential    On Primidone  . Unstable angina pectoris [facility])  June 2013   Cardiac cath: occluded SVG-DI. Patent LIMA-LAD and SVG-OM. EF 45% with apical inferior HK.    Past Surgical History:  Procedure Laterality Date  . CARDIAC CATHETERIZATION    . CATARACT EXTRACTION, BILATERAL    . CORONARY ARTERY BYPASS GRAFT  06/2002   LIMA-LAD, SVG-D1, SVG-OM  . EYE SURGERY    . KNEE SURGERY Left   . LEFT HEART CATHETERIZATION WITH CORONARY/GRAFT ANGIOGRAM N/A 10/13/2011   Procedure: LEFT HEART CATHETERIZATION WITH Isabel Caprice;  Surgeon: Marykay Lex, MD;  Location: [facility] CATH LAB;  Service: Cardiovascular;  Laterality: N/A;  . MINOR PLACEMENT OF FIDUCIAL N/A 04/26/2017   Procedure: Fiducial placement;  Surgeon: Maeola Harman, MD;  Location: [facility] OR;  Service: Neurosurgery;  Laterality: N/A;  Fiducial placement  . NM MYOCAR PERF WALL MOTION  03/23/2009   protocol:Bruce, normal perfusion in all regions, post-stress EF 72%, exercise capacity , EKG negative for ischemia.  [name] PULSE GENERATOR IMPLANT Bilateral 11/13/2017   Procedure: Bilateral Implantable pulse generator;  Surgeon: Maeola Harman, MD;  Location: [facility] OR;  Service: Neurosurgery;  Laterality: Bilateral;  Bilateral Implantable pulse generator  . Shoulder Orthoscopic Surgery   06/2003  . SUBTHALAMIC STIMULATOR INSERTION Bilateral 05/04/2017   Procedure: Bilateral Deep brain stimulator placement;  Surgeon: Maeola Harman, MD;  Location: [facility] OR;  Service: Neurosurgery;  Laterality: Bilateral;  Bilateral deep brain stimulator placement  . TRANSTHORACIC ECHOCARDIOGRAM  04/2017   Jan 2019: Normal LV size and function.  EF 55-60%.  No or W MA.  Mild LA dilation.  . wrist ganglion cyst Left     Social History   Socioeconomic History  . Marital status: Married    Spouse name: [name]  . Number of children: 2  . Years of education: 10  . Highest education level: Not on file    Occupational History  . Occupation: retired    Comment: Geneticist, molecular - Event organiser x 30 years  Social Needs  . Financial resource strain: Not hard at all  . Food insecurity:    Worry: Never true    Inability: Never true  . Transportation needs:    Medical: No    Non-medical: No  Tobacco Use  . Smoking status: Former Smoker    Packs/day: 3.00    Years: 20.00    Pack years: 60.00    Types: Cigarettes    Last attempt to quit: 04/17/1968    Years since quitting: 49.6  . Smokeless tobacco: Never Used  Substance and Sexual Activity  . Alcohol use: No    Frequency: Never  . Drug use: No  . Sexual activity: Never  Lifestyle  . Physical activity:    Days per week: 0 days    Minutes per session: 0 min  . Stress: Only a little  Relationships  . Social connections:    Talks on phone: More than three times a week    Gets together: Three times a week    Attends religious service: Patient refused    Active member of club or organization: Patient refused    Attends meetings of clubs or organizations: Patient refused    Relationship status: Married  . Intimate partner violence:  Fear of current or ex partner: No    Emotionally abused: No    Physically abused: No    Forced sexual activity: No  Other Topics Concern  . Not on file  Social History Narrative   Married.  Wife is Royal Piedra.   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   Former smoker, quit 20 years ago. Does not drink alcohol.   Does not get routine exercise, walks sometimes with a cane.   Caffeine use: diet-soda   Left handed     Family Status  Relation Name Status  . Mother  Deceased at age 17       CHF  . Father  Deceased at age 2       following a fall  . Sister  Alive  . Brother  Alive  . [employer]  Alive  . Brother  Alive       pt. has four brothers  . Sister  Alive  . Daughter  Alive  . Daughter  Alive  . Brother  Alive  . Brother  Alive    Review of Systems A complete 10  system ROS was obtained and was negative apart from what is mentioned.   Objective:   VITALS:   Vitals:   11/26/17 1426  BP: 106/62  Pulse: 60  SpO2: 92%  Weight: 214 lb (97.1 kg)  Height: 5\' 6"  (1.676 m)   GEN:  The patient appears stated age and is in NAD. HEENT:  Normocephalic, atraumatic.  The mucous membranes are moist. The superficial temporal arteries are without ropiness or tenderness. CV:  RRR Lungs:  CTAB Neck/HEME:  There are no carotid bruits bilaterally. Skin: There is a wound on the heel of the left foot, the center of which is very dark (black).  Neurological examination:  Orientation: The patient is alert and oriented x3. Cranial nerves: There is good facial symmetry. The speech is fluent and clear. Soft palate rises symmetrically and there is no tongue deviation. Hearing is intact to conversational tone. Sensation: Sensation is intact to light touch throughout Motor: Strength is 5/5 in the bilateral upper and lower extremities.   Shoulder shrug is equal and symmetric.  There is no pronator drift.  Movement examination: Tone: There is normal tone in the UE/LE Abnormal movements: Prior programming today, there was just mild pointer finger tremor on the right.  There was mild to moderate tremor on the left.  Following program, tremor resolved completely.   Coordination:  There is no decremation with RAM's, with any form of RAMS, including alternating supination and pronation of the forearm, hand opening and closing, finger taps, heel taps and toe taps. Gait and Station: The patient pushes off of the chair to arise.  He uses the walker and is steady with that but unsteady without it    labs: Lab Results  Component Value Date   HGBA1C 5.8 (H) 05/16/2017     Chemistry      Component Value Date/Time   NA 138 11/05/2017 1137   NA 138 08/29/2017 1547   K 4.5 11/05/2017 1137   CL 102 11/05/2017 1137   CO2 26 11/05/2017 1137   BUN 12 11/05/2017 1137   BUN 12  08/29/2017 1547   CREATININE 1.20 11/05/2017 1137   CREATININE 1.02 05/01/2016 1118      Component Value Date/Time   CALCIUM 9.3 11/05/2017 1137   ALKPHOS 82 07/16/2017 1528   AST 17 07/16/2017 1528   ALT 17 07/16/2017 1528  BILITOT 1.2 07/16/2017 1528     DBS programming was performed today which is described in more detail on a separate programming procedure notes.  In brief, there is complete resolution of tremor following programming.  He was able to pour water from one glass to another without difficulty.   Assessment/Plan:   1.  Essential Tremor.  -Patient is status post bilateral DBS to the bilateral VIM on May 04, 2017.  He suffered a postoperative bleed on the left, with resultant hematoma causing initial paralysis of the right hemi-soma and then mild paresis on the right.  Because of these complications, along with multiple other infections, the patient's battery was not placed until November 13, 2017.  DBS programming was performed today and resulted in resolution of tremor.  Postoperative video was performed today.  -baclofen - 10 mg q hs.  Was planning on taking him off of this and primidone, but ultimately decided to hold that until we are sure that patient is adequately program with DBS.  -continue primidone 50 mg bid.  We will likely wean him off next time if tremor is controlled.  -Patient wife had to use patient DBS controller.  -his seroquel has been discontinued  2.  Possible myoclonus  -Better on lower dose of gabapentin, 300 mg twice per day.  3.  Wound, left foot  -This needs wound care.  Told the patient/wife that he needs an immediate appointment with primary care.  Offered to call and make that appointment, but wife states that she really would like to make herself to avoid conflicts in their schedule.  4.  I will see the patient back in 6 weeks   CC:  Brendan March, MD

## 2017-11-26 ENCOUNTER — Ambulatory Visit (INDEPENDENT_AMBULATORY_CARE_PROVIDER_SITE_OTHER): Payer: PPO | Admitting: Neurology

## 2017-11-26 ENCOUNTER — Encounter: Payer: Self-pay | Admitting: Neurology

## 2017-11-26 VITALS — BP 106/62 | HR 60 | Ht 66.0 in | Wt 214.0 lb

## 2017-11-26 DIAGNOSIS — I619 Nontraumatic intracerebral hemorrhage, unspecified: Secondary | ICD-10-CM | POA: Diagnosis not present

## 2017-11-26 DIAGNOSIS — S91302A Unspecified open wound, left foot, initial encounter: Secondary | ICD-10-CM | POA: Diagnosis not present

## 2017-11-26 DIAGNOSIS — G250 Essential tremor: Secondary | ICD-10-CM | POA: Diagnosis not present

## 2017-11-26 DIAGNOSIS — G8110 Spastic hemiplegia affecting unspecified side: Secondary | ICD-10-CM | POA: Diagnosis not present

## 2017-11-26 DIAGNOSIS — R339 Retention of urine, unspecified: Secondary | ICD-10-CM | POA: Diagnosis not present

## 2017-11-26 NOTE — Procedures (Signed)
DBS Programming was performed.    Type of device:  Medtronic  Total time spent programming was 30 minutes.  Device was turned on.  Soft start was confirmed to be on.  Impedences were checked and were within normal limits.  Battery was checked and was determined to be functioning normally and not near the end of life.  Final settings were as follows:  Left brain electrode:     1-0+           ; Amplitude  2.0  V   ; Pulse width 90 microseconds;   Frequency   160   Hz.  Right brain electrode:     1-0+          ; Amplitude   2.2  V ;  Pulse width 90  microseconds;  Frequency   160    Hz.

## 2017-11-27 ENCOUNTER — Ambulatory Visit: Payer: PPO

## 2017-11-28 ENCOUNTER — Other Ambulatory Visit: Payer: Self-pay

## 2017-11-28 ENCOUNTER — Ambulatory Visit (INDEPENDENT_AMBULATORY_CARE_PROVIDER_SITE_OTHER): Payer: PPO | Admitting: Family Medicine

## 2017-11-28 ENCOUNTER — Ambulatory Visit
Admission: RE | Admit: 2017-11-28 | Discharge: 2017-11-28 | Disposition: A | Discharge status: Home / Self Care | Payer: PPO | Source: Ambulatory Visit | Attending: Family Medicine | Admitting: Family Medicine

## 2017-11-28 ENCOUNTER — Encounter: Payer: Self-pay | Admitting: Family Medicine

## 2017-11-28 VITALS — BP 128/68 | HR 63 | Temp 98.5°F | Wt 212.0 lb

## 2017-11-28 DIAGNOSIS — M79671 Pain in right foot: Secondary | ICD-10-CM | POA: Diagnosis not present

## 2017-11-28 DIAGNOSIS — S90811A Abrasion, right foot, initial encounter: Secondary | ICD-10-CM

## 2017-11-28 DIAGNOSIS — S99921A Unspecified injury of right foot, initial encounter: Secondary | ICD-10-CM | POA: Diagnosis not present

## 2017-11-28 DIAGNOSIS — M7989 Other specified soft tissue disorders: Secondary | ICD-10-CM | POA: Diagnosis not present

## 2017-11-28 MED ORDER — BACITRACIN-NEOMYCIN-POLYMYXIN 400-5-5000 EX OINT: 1.0000 "application " | TOPICAL_OINTMENT | Freq: Two times a day (BID) | CUTANEOUS | 0 refills | Status: DC

## 2017-11-28 MED ORDER — AMOXICILLIN 500 MG PO CAPS
500.0000 mg | ORAL_CAPSULE | Freq: Three times a day (TID) | ORAL | 0 refills | Status: AC
Start: 2017-11-28 — End: 2017-12-03

## 2017-11-28 MED ORDER — DOXYCYCLINE HYCLATE 100 MG PO TABS
100.0000 mg | ORAL_TABLET | Freq: Two times a day (BID) | ORAL | 0 refills | Status: DC
Start: 2017-11-28 — End: 2018-01-07

## 2017-11-28 NOTE — Progress Notes (Signed)
    Subjective:  Brendan [name] is a 77 y.o. male who [facility] with a chief complaint of right heel wound.   HPI: Patient referred by neurologist for right heel wound.  Patient says wound has been there for 2 weeks.  Says that it developed from the blister after the patient fell.  Says that it is painful and getting worse.  Denies fevers or chills.  Denies any purulent drainage.  Denies falling since the event.   ROS: Per HPI  PMH: Smoking history reviewed.    Objective:  Physical Exam: BP 128/68   Pulse 63   Temp 98.5 F (36.9 C) (Oral)   Wt 212 lb (96.2 kg)   SpO2 96%   BMI 34.22 kg/m   Gen: NAD, resting comfortably CV: RRR with no murmurs appreciated Pulm: NWOB, CTAB with no crackles, wheezes, or rhonchi MSK: Right heel has stage II ulcer with black eschar, surrounding erythema tenderness and swelling , chronic edema bilaterally Neuro: Difficulty ambulating, slowly, uses a walker to walk      No results found for this or any previous visit (from the past 72 hour(s)).   Assessment/Plan:  Abrasion of right heel Right heel, appears to be chronic, concern for deeper infection.  Likely caused by friction from abnormal gait given history of spastic hemiparesis, the CVA, tremor etc. we will start antibiotic with MRSA coverage given recent hospitalization for deep brain stimulator implant.:  Amoxicillin 500 mg 3 times daily and doxycycline 100 mg twice daily.  Recommend triple antibiotic ointment coverage with nonadhesive bandage, change daily.  We will get x-ray to evaluate for possible osteomyelitis.  Further referrals to wound center and podiatry for further evaluation.  Patient return in 1 week for reassessment.   Lab Orders  No laboratory test(s) ordered today    Meds ordered this encounter  Medications  . amoxicillin (AMOXIL) 500 MG capsule    Sig: Take 1 capsule (500 mg total) by mouth 3 (three) times daily for 5 days.    Dispense:  15 capsule   Refill:  0  . doxycycline (VIBRA-TABS) 100 MG tablet    Sig: Take 1 tablet (100 mg total) by mouth 2 (two) times daily.    Dispense:  20 tablet    Refill:  0  . neomycin-bacitracin-polymyxin (NEOSPORIN) ointment    Sig: Apply 1 application topically every 12 (twelve) hours.    Dispense:  15 g    Refill:  0    Thomes DinningBrad Gari Trovato, MD, MS FAMILY MEDICINE RESIDENT - PGY1 11/28/2017 4:38 PM

## 2017-11-28 NOTE — Assessment & Plan Note (Signed)
Right heel, appears to be chronic, concern for deeper infection.  Likely caused by friction from abnormal gait given history of spastic hemiparesis, the CVA, tremor etc. we will start antibiotic with MRSA coverage given recent hospitalization for deep brain stimulator implant.:  Amoxicillin 500 mg 3 times daily and doxycycline 100 mg twice daily.  Recommend triple antibiotic ointment coverage with nonadhesive bandage, change daily.  We will get x-ray to evaluate for possible osteomyelitis.  Further referrals to wound center and podiatry for further evaluation.  Patient return in 1 week for reassessment.

## 2017-11-28 NOTE — Patient Instructions (Signed)

## 2017-11-29 ENCOUNTER — Encounter: Payer: Self-pay | Admitting: Family Medicine

## 2017-12-05 ENCOUNTER — Other Ambulatory Visit: Payer: Self-pay

## 2017-12-05 ENCOUNTER — Ambulatory Visit (INDEPENDENT_AMBULATORY_CARE_PROVIDER_SITE_OTHER): Payer: PPO | Admitting: Family Medicine

## 2017-12-05 ENCOUNTER — Encounter: Payer: Self-pay | Admitting: Family Medicine

## 2017-12-05 VITALS — BP 112/62 | HR 57 | Temp 98.3°F | Ht 66.0 in | Wt 210.4 lb

## 2017-12-05 DIAGNOSIS — S90811D Abrasion, right foot, subsequent encounter: Secondary | ICD-10-CM

## 2017-12-05 MED ORDER — "GAUZE BANDAGE 3"" MISC": 1.0000 "application " | Freq: Every day | 0 refills | Status: DC

## 2017-12-05 MED ORDER — TRAMADOL HCL 50 MG PO TABS: 50.0000 mg | ORAL_TABLET | Freq: Three times a day (TID) | ORAL | 0 refills | Status: AC | PRN

## 2017-12-05 MED ORDER — ADHESIVE TAPE TAPE: 1.0000 | MEDICATED_TAPE | Freq: Every day | 3 refills | Status: DC

## 2017-12-05 NOTE — Progress Notes (Signed)
    Subjective:  Brendan [name] is a 77 y.o. male [facility]up on wound on right heel.  HPI: Wound is improving.  Still mildly tender.  Patient reports taking ibuprofen as needed for pain, but overall the pain is not getting worse.  Denies any fevers or chills.  Denies any purulent drainage.  Has been changing dressing daily and keeping it clean and applying bacitracin ointment.  Recent x-ray showed no evidence of osteomyelitis.  Patient has wound center appointment on September 9, cannot get in any earlier due to que.  Patient reports completing antibiotics.   ROS: Per HPI   Objective:  Physical Exam: BP 112/62   Pulse (!) 57   Temp 98.3 F (36.8 C) (Oral)   Ht 5\' 6"  (1.676 m)   Wt 210 lb 6.4 oz (95.4 kg)   SpO2 94%   BMI 33.96 kg/m   Gen: NAD, resting comfortably Right foot: Stage II ulcer with granulation tissue, evidence of good blood flow to the region, eschar is replaced with granulation tissue, no purulent drainage, surrounding with minimal erythema  No results found for this or any previous visit (from the past 72 hour(s)).   Assessment/Plan:  Abrasion of right heel Improved.  No signs of systemic infection, continue bacitracin ointment daily bandage changes.  Patient return in 1 week to follow-up.  Encourage patient to keep appointment with the wound center.  Will have patient stop taking ibuprofen, and trial tramadol for pain.   Lab Orders  No laboratory test(s) ordered today    Meds ordered this encounter  Medications  . Gauze Pads & Dressings (GAUZE BANDAGE 3") MISC    Sig: 1 application by Does not apply route daily.    Dispense:  30 each    Refill:  0    Non-adherent/non-stick  . Adhesive Tape TAPE    Sig: 1 each by Does not apply route daily.    Dispense:  1 each    Refill:  3  . traMADol (ULTRAM) 50 MG tablet    Sig: Take 1 tablet (50 mg total) by mouth every 8 (eight) hours as needed for up to 7 days.    Dispense:  20  tablet    Refill:  0    Thomes DinningBrad Kameko Hukill, MD, MS FAMILY MEDICINE RESIDENT - PGY1 12/05/2017 12:04 PM

## 2017-12-05 NOTE — Assessment & Plan Note (Signed)
Improved.  No signs of systemic infection, continue bacitracin ointment daily bandage changes.  Patient return in 1 week to follow-up.  Encourage patient to keep appointment with the wound center.  Will have patient stop taking ibuprofen, and trial tramadol for pain.

## 2017-12-05 NOTE — Patient Instructions (Addendum)
It was a pleasure to see you today! Thank you for choosing [facility] for your primary care. Brendan [name] was seen for right heal wound. It is improving. Come back to clinic if its bleeding,   Continue to change to dressing daily, keep it clean, and apply the ointment. Take tramadol and tylenol as needed for pain.    Best,  Thomes DinningBrad Casten Floren, MD, MS FAMILY MEDICINE RESIDENT - PGY2 12/05/2017 11:13 AM

## 2017-12-06 ENCOUNTER — Ambulatory Visit: Payer: PPO | Admitting: Podiatry

## 2017-12-07 ENCOUNTER — Other Ambulatory Visit: Payer: Self-pay | Admitting: Family Medicine

## 2017-12-07 ENCOUNTER — Other Ambulatory Visit: Payer: Self-pay | Admitting: Physical Medicine & Rehabilitation

## 2017-12-12 ENCOUNTER — Ambulatory Visit (INDEPENDENT_AMBULATORY_CARE_PROVIDER_SITE_OTHER): Payer: PPO | Admitting: Family Medicine

## 2017-12-12 ENCOUNTER — Encounter: Payer: Self-pay | Admitting: Family Medicine

## 2017-12-12 DIAGNOSIS — S90811D Abrasion, right foot, subsequent encounter: Secondary | ICD-10-CM

## 2017-12-12 NOTE — Progress Notes (Signed)
    Subjective:  Brendan [name] is a 77 y.o. male who [facility] follow up on wound care of right foot.   HPI: Right foot continues to improve.  Patient denies any fevers or chills.  He has been cleaning the area was gently with soap and water.  He has been changing the dressing daily with nonstick gauze and applying bacitracin.  Pain is better controlled.  Denies any overt bleeding.  Denies any worsening pain in his foot.  Patient has scheduled follow-up with wound care on September 9.  Plans to follow-up with podiatry after that.  ROS: Per HPI   Objective:  Physical Exam: There were no vitals taken for this visit.  Right heel: Abrasion wound healing, granulation tissue, improved eschar, no erythema or streaking on the periphery  No results found for this or any previous visit (from the past 72 hour(s)).     Assessment/Plan:  Abrasion of right heel Wound continues to heal.  Recommend continued nonstick dressing changes with bacitracin change daily.  Strongly encouraged to keep appointment with wound care September 9.  Patient follow-up with PCP in 3 weeks to follow wound treatment.   Lab Orders  No laboratory test(s) ordered today    No orders of the defined types were placed in this encounter.   Brendan DinningBrad Wilmont Olund, MD, MS FAMILY MEDICINE RESIDENT - PGY2 12/12/2017 2:38 PM

## 2017-12-12 NOTE — Assessment & Plan Note (Signed)
Wound continues to heal.  Recommend continued nonstick dressing changes with bacitracin change daily.  Strongly encouraged to keep appointment with wound care September 9.  Patient follow-up with PCP in 3 weeks to follow wound treatment.

## 2017-12-19 DIAGNOSIS — R338 Other retention of urine: Secondary | ICD-10-CM | POA: Diagnosis not present

## 2017-12-19 DIAGNOSIS — R3912 Poor urinary stream: Secondary | ICD-10-CM | POA: Diagnosis not present

## 2017-12-19 DIAGNOSIS — N401 Enlarged prostate with lower urinary tract symptoms: Secondary | ICD-10-CM | POA: Diagnosis not present

## 2017-12-24 ENCOUNTER — Encounter: Payer: PPO | Attending: Physician Assistant | Admitting: Physician Assistant

## 2017-12-24 DIAGNOSIS — L89613 Pressure ulcer of right heel, stage 3: Secondary | ICD-10-CM | POA: Insufficient documentation

## 2017-12-24 DIAGNOSIS — L89612 Pressure ulcer of right heel, stage 2: Secondary | ICD-10-CM | POA: Diagnosis not present

## 2017-12-24 DIAGNOSIS — Z951 Presence of aortocoronary bypass graft: Secondary | ICD-10-CM | POA: Insufficient documentation

## 2017-12-24 DIAGNOSIS — J449 Chronic obstructive pulmonary disease, unspecified: Secondary | ICD-10-CM | POA: Insufficient documentation

## 2017-12-24 DIAGNOSIS — I25810 Atherosclerosis of coronary artery bypass graft(s) without angina pectoris: Secondary | ICD-10-CM | POA: Insufficient documentation

## 2017-12-24 DIAGNOSIS — E785 Hyperlipidemia, unspecified: Secondary | ICD-10-CM | POA: Insufficient documentation

## 2017-12-24 DIAGNOSIS — I252 Old myocardial infarction: Secondary | ICD-10-CM | POA: Insufficient documentation

## 2017-12-24 DIAGNOSIS — Z9689 Presence of other specified functional implants: Secondary | ICD-10-CM | POA: Diagnosis not present

## 2017-12-24 DIAGNOSIS — I10 Essential (primary) hypertension: Secondary | ICD-10-CM | POA: Diagnosis not present

## 2017-12-24 DIAGNOSIS — G629 Polyneuropathy, unspecified: Secondary | ICD-10-CM | POA: Diagnosis not present

## 2017-12-25 NOTE — Progress Notes (Signed)
MESSIAH, AHR ([id number]) Visit Report for 12/24/2017 Abuse/Suicide Risk Screen Details Patient Name: Brendan [name], Brendan [name] Date of Service: 12/24/2017 10:30 AM Medical Record Number: [id number] Patient Account Number: [id number] Date of Birth/Sex: 1940/10/28 (77 y.o. Male) Treating RN: Curtis Sites Primary Care Ashlinn Hemrick: [name] Other Clinician: Referring Tijana Walder: Fanny Bien Treating Shaune Malacara/Extender: Linwood Dibbles, HOYT Weeks in Treatment: 0 Abuse/Suicide Risk Screen Items Answer ABUSE/SUICIDE RISK SCREEN: Has anyone close to you tried to hurt or harm you recentlyo No Do you feel uncomfortable with anyone in your familyo No Has anyone forced you do things that you didnot want to doo No Do you have any thoughts of harming yourselfo No Patient displays signs or symptoms of abuse and/or neglect. No Electronic Signature(s) Signed: 12/24/2017 5:04:31 PM By: Curtis Sites Entered By: Curtis Sites on 12/24/2017 10:29:45 Brendan [name] ([id number]) -------------------------------------------------------------------------------- Activities of Daily Living Details Patient Name: Brendan [name] Date of Service: 12/24/2017 10:30 AM Medical Record Number: [id number] Patient Account Number: [id number] Date of Birth/Sex: 01/29/1941 (77 y.o. Male) Treating RN: Curtis Sites Primary Care Nellie Pester: [name] Other Clinician: Referring Montre Harbor: Fanny Bien Treating Qasim Diveley/Extender: Linwood Dibbles, HOYT Weeks in Treatment: 0 Activities of Daily Living Items Answer Activities of Daily Living (Please select one for each item) Drive Automobile Not Able Take Medications Completely Able Use Telephone Completely Able Care for Appearance Completely Able Use Toilet Completely Able Bath / Shower Need Assistance Dress Self Completely Able Feed Self Completely Able Walk Need Assistance Get In / Out Bed Completely Able Housework Need Assistance Prepare Meals Need Assistance Handle  Money Completely Able Shop for Self Need Assistance Electronic Signature(s) Signed: 12/24/2017 5:04:31 PM By: Curtis Sites Entered By: Curtis Sites on 12/24/2017 10:30:26 Brendan [name] ([id number]) -------------------------------------------------------------------------------- Education Assessment Details Patient Name: Brendan [name] Date of Service: 12/24/2017 10:30 AM Medical Record Number: [id number] Patient Account Number: [id number] Date of Birth/Sex: [birth year] (77 y.o. Male) Treating RN: Curtis Sites Primary Care Brexlee Heberlein: [name] Other Clinician: Referring Kelyse Pask: Fanny Bien Treating Jeno Calleros/Extender: Linwood Dibbles, HOYT Weeks in Treatment: 0 Primary Learner Assessed: Caregiver wife Reason Patient is not Primary Learner: wound location Learning Preferences/Education Level/Primary Language Learning Preference: Explanation, Demonstration Highest Education Level: High School Preferred Language: English Cognitive Barrier Assessment/Beliefs Language Barrier: No Translator Needed: No Memory Deficit: No Emotional Barrier: No Cultural/Religious Beliefs Affecting Medical Care: No Physical Barrier Assessment Impaired Vision: No Impaired Hearing: No Decreased Hand dexterity: No Knowledge/Comprehension Assessment Knowledge Level: Medium Comprehension Level: Medium Ability to understand written Medium instructions: Ability to understand verbal Medium instructions: Motivation Assessment Anxiety Level: Calm Cooperation: Cooperative Education Importance: Acknowledges Need Interest in Health Problems: Asks Questions Perception: Coherent Willingness to Engage in Self- Medium Management Activities: Readiness to Engage in Self- Medium Management Activities: Electronic Signature(s) Signed: 12/24/2017 5:04:31 PM By: Curtis Sites Entered By: Curtis Sites on 12/24/2017 10:30:59 Brendan [name]  ([id number]) -------------------------------------------------------------------------------- Fall Risk Assessment Details Patient Name: Brendan [name] Date of Service: 12/24/2017 10:30 AM Medical Record Number: [id number] Patient Account Number: [id number] Date of Birth/Sex: 1940-07-04 (77 y.o. Male) Treating RN: Curtis Sites Primary Care Dashea Mcmullan: [name] Other Clinician: Referring Arabela Basaldua: Fanny Bien Treating Lucindy Borel/Extender: Linwood Dibbles, HOYT Weeks in Treatment: 0 Fall Risk Assessment Items Have you had 2 or more falls in the last 12 monthso 0 No Have you had any fall that resulted in injury in the last 12 monthso 0 Yes FALL RISK ASSESSMENT: History of falling - immediate or within 3 months 25 Yes Secondary diagnosis 0 No Ambulatory aid  None/bed rest/wheelchair/nurse 0 No Crutches/cane/walker 15 Yes Furniture 0 No IV Access/Saline Lock 0 No Gait/Training Normal/bed rest/immobile 0 No Weak 10 Yes Impaired 20 Yes Mental Status Oriented to own ability 0 Yes Electronic Signature(s) Signed: 12/24/2017 5:04:31 PM By: Curtis Sites Entered By: Curtis Sites on 12/24/2017 10:31:13 Brendan [name] ([id number]) -------------------------------------------------------------------------------- Foot Assessment Details Patient Name: Brendan [name] Date of Service: 12/24/2017 10:30 AM Medical Record Number: [id number] Patient Account Number: [id number] Date of Birth/Sex: 1940/05/24 (77 y.o. Male) Treating RN: Curtis Sites Primary Care Clifford Benninger: [name] Other Clinician: Referring Maida Widger: Fanny Bien Treating Sakib Noguez/Extender: STONE III, HOYT Weeks in Treatment: 0 Foot Assessment Items Site Locations + = Sensation present, - = Sensation absent, C = Callus, U = Ulcer R = Redness, W = Warmth, M = Maceration, PU = Pre-ulcerative lesion F = Fissure, S = Swelling, D = Dryness Assessment Right: Left: Other Deformity: No No Prior Foot Ulcer: No No Prior  Amputation: No No Charcot Joint: No No Ambulatory Status: Ambulatory With Help Assistance Device: Walker Gait: Steady Electronic Signature(s) Signed: 12/24/2017 5:04:31 PM By: Curtis Sites Entered By: Curtis Sites on 12/24/2017 10:31:57 Brendan [name] ([id number]) -------------------------------------------------------------------------------- Nutrition Risk Assessment Details Patient Name: Brendan [name] Date of Service: 12/24/2017 10:30 AM Medical Record Number: [id number] Patient Account Number: [id number] Date of Birth/Sex: 05/28/1940 (77 y.o. Male) Treating RN: Curtis Sites Primary Care Dalaney Needle: [name] Other Clinician: Referring Keiko Myricks: Fanny Bien Treating Alexio Sroka/Extender: STONE III, HOYT Weeks in Treatment: 0 Height (in): 66 Weight (lbs): 210 Body Mass Index (BMI): 33.9 Nutrition Risk Assessment Items NUTRITION RISK SCREEN: I have an illness or condition that made me change the kind and/or amount of 0 No food I eat I eat fewer than two meals per day 0 No I eat few fruits and vegetables, or milk products 0 No I have three or more drinks of beer, liquor or wine almost every day 0 No I have tooth or mouth problems that make it hard for me to eat 0 No I don't always have enough money to buy the food I need 0 No I eat alone most of the time 0 No I take three or more different prescribed or over-the-counter drugs a day 1 Yes Without wanting to, I have lost or gained 10 pounds in the last six months 0 No I am not always physically able to shop, cook and/or feed myself 0 No Nutrition Protocols Good Risk Protocol 0 No interventions needed Moderate Risk Protocol Electronic Signature(s) Signed: 12/24/2017 5:04:31 PM By: Curtis Sites Entered By: Curtis Sites on 12/24/2017 10:31:21

## 2017-12-25 NOTE — Progress Notes (Signed)
DALBERT, STILLINGS ([id number]) Visit Report for 12/24/2017 Chief Complaint Document Details Patient Name: Brendan [name], Brendan [name] Date of Service: 12/24/2017 10:30 AM Medical Record Number: [id number] Patient Account Number: [id number] Date of Birth/Sex: [birth year] (77 y.o. Male) Treating RN: Renne Crigler Primary Care Provider: [name] Other Clinician: Referring Provider: Fanny Bien Treating Provider/Extender: Linwood Dibbles, HOYT Weeks in Treatment: 0 Information Obtained from: Patient Chief Complaint Right heel ulcer Electronic Signature(s) Signed: 12/24/2017 12:06:38 PM By: Lenda Kelp PA-C Entered By: Lenda Kelp on 12/24/2017 11:04:10 Brendan [name] ([id number]) -------------------------------------------------------------------------------- Debridement Details Patient Name: Brendan [name] Date of Service: 12/24/2017 10:30 AM Medical Record Number: [id number] Patient Account Number: [id number] Date of Birth/Sex: 07-May-1940 (77 y.o. Male) Treating RN: Renne Crigler Primary Care Provider: [name] Other Clinician: Referring Provider: Fanny Bien Treating Provider/Extender: Linwood Dibbles, HOYT Weeks in Treatment: 0 Debridement Performed for Wound #1 Right Calcaneus Assessment: Performed By: Physician STONE III, HOYT E., PA-C Debridement Type: Debridement Pre-procedure Verification/Time Yes - 11:11 Out Taken: Start Time: 11:11 Pain Control: Other : lidocaine 4% Total Area Debrided (L x W): 2.3 (cm) x 2.2 (cm) = 5.06 (cm) Tissue and other material Viable, Non-Viable, Slough, Subcutaneous, Biofilm, Slough debrided: Level: Skin/Subcutaneous Tissue Debridement Description: Excisional Instrument: Curette Bleeding: Minimum Hemostasis Achieved: Pressure End Time: 11:13 Procedural Pain: 0 Post Procedural Pain: 0 Response to Treatment: Procedure was tolerated well Level of Consciousness: Awake and Alert Post Debridement Measurements of Total Wound Length:  (cm) 2.3 Stage: Category/Stage II Width: (cm) 2.2 Depth: (cm) 0.1 Volume: (cm) 0.397 Character of Wound/Ulcer Post Stable Debridement: Post Procedure Diagnosis Same as Pre-procedure Electronic Signature(s) Signed: 12/24/2017 12:06:38 PM By: Lenda Kelp PA-C Signed: 12/24/2017 4:39:16 PM By: Renne Crigler Entered By: Renne Crigler on 12/24/2017 11:13:24 Brendan [name] ([id number]) -------------------------------------------------------------------------------- HPI Details Patient Name: Brendan [name] Date of Service: 12/24/2017 10:30 AM Medical Record Number: [id number] Patient Account Number: [id number] Date of Birth/Sex: 07/06/1940 (77 y.o. Male) Treating RN: Renne Crigler Primary Care Provider: [name] Other Clinician: Referring Provider: Fanny Bien Treating Provider/Extender: Linwood Dibbles, HOYT Weeks in Treatment: 0 History of Present Illness HPI Description: 12/24/17 on evaluation today patient's is seen for initial evaluation concerning issues he's been having with his right heel for one month. According to the patient and his wife he sustained a fall which led to this injury. With that being said the appearance of the wound as well as the location of the wound seems to indicate more a pressure type injury in my pinion. Nonetheless whether pressure or otherwise abrasion the patient does have depth to subcutaneous tissue there is some Slough noted as well. He fortunately has no significant discomfort although he does have peripheral vascular disease as evidenced by the x-ray which was performed and showed no acute bone abnormality but there was purple vascular calcification noted. He has had a previous heart attack with bypass surgery subsequent and has a current ejection fraction of 42 for the 5% on the last check. He does have a history of hypertension as well as COPD as well. The patient states overall things seem to be doing a little bit better but  nonetheless they did want to come in to be checked out. Unfortunately they could not get into [place] in a sufficient amount of time they live in [place] therefore they are coming to [place] to see me here. I do believe we may want to send for vascular testing in that case they would like to go to [facility]  for that. His ABI's performed here in the office were noncompressible at 1.54 left in 1.46 right. No fevers, chills, nausea, or vomiting noted at this time. Patient does have a brain stimulator due to trimmers that he was experiencing. According to the patient's wife this is MRI compatible according to their physician. They just have to turn off the stimulation. Electronic Signature(s) Signed: 12/24/2017 12:06:38 PM By: Lenda Kelp PA-C Entered By: Lenda Kelp on 12/24/2017 12:01:54 Brendan [name] ([id number]) -------------------------------------------------------------------------------- Physical Exam Details Patient Name: Brendan [name] Date of Service: 12/24/2017 10:30 AM Medical Record Number: [id number] Patient Account Number: [id number] Date of Birth/Sex: [birth year] (77 y.o. Male) Treating RN: Renne Crigler Primary Care Provider: [name] Other Clinician: Referring Provider: Fanny Bien Treating Provider/Extender: Linwood Dibbles, HOYT Weeks in Treatment: 0 Constitutional sitting or standing blood pressure is within target range for patient.. pulse regular and within target range for patient.[name] respirations regular, non-labored and within target range for patient.[name] temperature within target range for patient.. Well- nourished and well-hydrated in no acute distress. Eyes conjunctiva clear no eyelid edema noted. pupils equal round and reactive to light and accommodation. Ears, Nose, Mouth, and Throat no gross abnormality of ear auricles or external auditory canals. normal hearing noted during conversation. mucus membranes moist. Respiratory normal  breathing without difficulty. clear to auscultation bilaterally. Cardiovascular regular rate and rhythm with normal S1, S2. 2+ dorsalis pedis/posterior tibialis pulses. no clubbing, cyanosis, significant edema, <3 sec cap refill. Gastrointestinal (GI) soft, non-tender, non-distended, +BS. no ventral hernia noted. Musculoskeletal Patient unable to walk without assistance. no significant deformity or arthritic changes, no loss or range of motion, no clubbing. Psychiatric this patient is able to make decisions and demonstrates good insight into disease process. Alert and Oriented x 3. pleasant and cooperative. Notes On inspection today patient's wound itself actually had evidence of slough noted on the surface of the wound. His blood pressure seem to be doing fairly well which is good news. He did have 2+ pulses in the dorsalis Peterson posterior tibial locations bilaterally. With that being said though he has good pulses he does have calcifications and there's a question as to whether or not this will indicate sufficient blood flow or not. His foot nonetheless was warm to palpation with no erythema noted and he had less than three second capillary refill which was also good news. Overall everything on physical exam would seem to indicate that he has good blood flow but again I do not want to take the chance with significant debridement at this point. Therefore I did perform debridement but this was more of a light debridement with some subcutaneous involvement but nothing to truly clear away all of the necrotic tissue overlying the subcutaneous tissue. I wanted to get the surface off so that we could work on the deeper levels with medication at least until we get confirmation that he has good arterial flow. Electronic Signature(s) Signed: 12/24/2017 12:06:38 PM By: Lenda Kelp PA-C Entered By: Lenda Kelp on 12/24/2017 12:01:24 Brendan [name]  ([id number]) -------------------------------------------------------------------------------- Physician Orders Details Patient Name: Brendan [name] Date of Service: 12/24/2017 10:30 AM Medical Record Number: [id number] Patient Account Number: [id number] Date of Birth/Sex: 06-Aug-1940 (78 y.o. Male) Treating RN: Renne Crigler Primary Care Provider: [name] Other Clinician: Referring Provider: Fanny Bien Treating Provider/Extender: Linwood Dibbles, HOYT Weeks in Treatment: 0 Verbal / Phone Orders: No Diagnosis Coding ICD-10 Coding Code Description L89.613 Pressure ulcer of right heel, stage 3 I10 Essential (primary)  hypertension I25.810 Atherosclerosis of coronary artery bypass graft(s) without angina pectoris Z96.89 Presence of other specified functional implants Wound Cleansing Wound #1 Right Calcaneus o Clean wound with Normal Saline. Anesthetic (add to Medication List) o Topical Lidocaine 4% cream applied to wound bed prior to debridement (In Clinic Only). Skin Barriers/Peri-Wound Care Wound #1 Right Calcaneus o Skin Prep Primary Wound Dressing Wound #1 Right Calcaneus o Iodoflex Secondary Dressing Wound #1 Right Calcaneus o Boardered Foam Dressing Dressing Change Frequency Wound #1 Right Calcaneus o Change dressing every other day. Follow-up Appointments Wound #1 Right Calcaneus o Return Appointment in 1 week. Consults o Vascular - [place] [facility] referral for bilateral TBI and ABI Electronic Signature(s) Signed: 12/24/2017 12:06:38 PM By: Lenda Kelp PA-C Signed: 12/24/2017 4:39:16 PM By: Artemio Aly, Landis Martins ([id number]) Entered By: Renne Crigler on 12/24/2017 12:05:43 Brendan [name] ([id number]) -------------------------------------------------------------------------------- Problem List Details Patient Name: Brendan [name] Date of Service: 12/24/2017 10:30 AM Medical Record Number: [id number] Patient Account Number:  [id number] Date of Birth/Sex: 03-22-1941 (77 y.o. Male) Treating RN: Renne Crigler Primary Care Provider: [name] Other Clinician: Referring Provider: Fanny Bien Treating Provider/Extender: Linwood Dibbles, HOYT Weeks in Treatment: 0 Active Problems ICD-10 Evaluated Encounter Code Description Active Date Today Diagnosis [phone] Pressure ulcer of right heel, stage 3 12/24/2017 No Yes I10 Essential (primary) hypertension 12/24/2017 No Yes I25.810 Atherosclerosis of coronary artery bypass graft(s) without 12/24/2017 No Yes angina pectoris Z96.89 Presence of other specified functional implants 12/24/2017 No Yes Inactive Problems Resolved Problems Electronic Signature(s) Signed: 12/24/2017 12:06:38 PM By: Lenda Kelp PA-C Entered By: Lenda Kelp on 12/24/2017 11:03:59 Brendan [name] ([id number]) -------------------------------------------------------------------------------- Progress Note Details Patient Name: Brendan [name] Date of Service: 12/24/2017 10:30 AM Medical Record Number: [id number] Patient Account Number: [id number] Date of Birth/Sex: 22-May-1940 (77 y.o. Male) Treating RN: Renne Crigler Primary Care Provider: [name] Other Clinician: Referring Provider: Fanny Bien Treating Provider/Extender: Linwood Dibbles, HOYT Weeks in Treatment: 0 Subjective Chief Complaint Information obtained from Patient Right heel ulcer History of Present Illness (HPI) 12/24/17 on evaluation today patient's is seen for initial evaluation concerning issues he's been having with his right heel for one month. According to the patient and his wife he sustained a fall which led to this injury. With that being said the appearance of the wound as well as the location of the wound seems to indicate more a pressure type injury in my pinion. Nonetheless whether pressure or otherwise abrasion the patient does have depth to subcutaneous tissue there is some Slough noted as well. He fortunately  has no significant discomfort although he does have peripheral vascular disease as evidenced by the x-ray which was performed and showed no acute bone abnormality but there was purple vascular calcification noted. He has had a previous heart attack with bypass surgery subsequent and has a current ejection fraction of 42 for the 5% on the last check. He does have a history of hypertension as well as COPD as well. The patient states overall things seem to be doing a little bit better but nonetheless they did want to come in to be checked out. Unfortunately they could not get into [place] in a sufficient amount of time they live in [place] therefore they are coming to [place] to see me here. I do believe we may want to send for vascular testing in that case they would like to go to [facility] for that. His ABI's performed here in the office were noncompressible at 1.54  left in 1.46 right. No fevers, chills, nausea, or vomiting noted at this time. Patient does have a brain stimulator due to trimmers that he was experiencing. According to the patient's wife this is MRI compatible according to their physician. They just have to turn off the stimulation. Wound History Patient presents with 1 open wound that has been present for approximately 1 month. Patient has been treating wound in the following manner: neosporin. Laboratory tests have been performed in the last month. Patient reportedly has not tested positive for an antibiotic resistant organism. Patient reportedly has not tested positive for osteomyelitis. Patient reportedly has not had testing performed to evaluate circulation in the legs. Patient experiences the following problems associated with their wounds: swelling. Patient History Information obtained from Patient. Allergies No Known Allergies Family History Heart Disease - Siblings, Hypertension - Siblings, No family history of Cancer, Diabetes, Hereditary Spherocytosis,  Kidney Disease, Lung Disease, Seizures, Stroke, Thyroid Problems, Tuberculosis. Social History Never smoker, Marital Status - Married, Alcohol Use - Never, Drug Use - No History, Caffeine Use - Rarely. Medical History Eyes Denies history of Cataracts, Glaucoma, Optic Neuritis Brendan [name], Brendan [name] ([id number]) Ear/Nose/Mouth/Throat Denies history of Chronic sinus problems/congestion, Middle ear problems Hematologic/Lymphatic Patient has history of Anemia Denies history of Hemophilia, Human Immunodeficiency Virus, Lymphedema, Sickle Cell Disease Respiratory Patient has history of Chronic Obstructive Pulmonary Disease (COPD) Denies history of Aspiration, Asthma, Pneumothorax, Sleep Apnea, Tuberculosis Cardiovascular Patient has history of Coronary Artery Disease, Hypertension Gastrointestinal Denies history of Cirrhosis , Colitis, Crohn s, Hepatitis A, Hepatitis B, Hepatitis C Endocrine Denies history of Type I Diabetes, Type II Diabetes Genitourinary Denies history of End Stage Renal Disease Immunological Denies history of Lupus Erythematosus, Raynaud s, Scleroderma Integumentary (Skin) Denies history of History of Burn, History of pressure wounds Musculoskeletal Patient has history of Osteoarthritis Denies history of Gout, Rheumatoid Arthritis, Osteomyelitis Neurologic Patient has history of Neuropathy Denies history of Dementia, Quadriplegia, Paraplegia, Seizure Disorder Oncologic Denies history of Received Chemotherapy, Received Radiation Psychiatric Denies history of Anorexia/bulimia, Confinement Anxiety Medical And Surgical History Notes Cardiovascular HLD Genitourinary self cath twice daily r/t retention Neurologic 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, 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 Review of Systems (ROS) Constitutional Symptoms (General Health) Denies complaints or symptoms of Fatigue, Fever, Chills, Marked Weight Change. Eyes Denies complaints or symptoms of  Dry Eyes, Vision Changes, Glasses / Contacts. Ear/Nose/Mouth/Throat Denies complaints or symptoms of Difficult clearing ears, Sinusitis. Hematologic/Lymphatic The patient has no complaints or symptoms. Respiratory Denies complaints or symptoms of Chronic or frequent coughs, Shortness of Breath. Cardiovascular Complains or has symptoms of LE edema. Denies complaints or symptoms of Chest pain. Gastrointestinal Denies complaints or symptoms of Frequent diarrhea, Nausea, Vomiting. Endocrine Denies complaints or symptoms of Hepatitis, Thyroid disease, Polydypsia (Excessive Thirst). Genitourinary Denies complaints or symptoms of Kidney failure/ Dialysis, Incontinence/dribbling. Immunological DHAIRYA, CORALES ([id number]) Denies complaints or symptoms of Hives, Itching. Integumentary (Skin) Complains or has symptoms of Wounds, Swelling. Denies complaints or symptoms of Bleeding or bruising tendency, Breakdown. Musculoskeletal Denies complaints or symptoms of Muscle Pain, Muscle Weakness. Neurologic Denies complaints or symptoms of Numbness/parasthesias, Focal/Weakness. Psychiatric Denies complaints or symptoms of Anxiety, Claustrophobia. Objective Constitutional sitting or standing blood pressure is within target range for patient.. pulse regular and within target range for patient.[name] respirations regular, non-labored and within target range for patient.[name] temperature within target range for patient.. Well- nourished and well-hydrated in no acute distress. Vitals Time Taken: 10:28 AM, Height: 66 in, Source: Measured, Weight: 210  lbs, Source: Measured, BMI: 33.9, Temperature: 98.0 F, Pulse: 64 bpm, Respiratory Rate: 18 breaths/min, Blood Pressure: 110/82 mmHg. Eyes conjunctiva clear no eyelid edema noted. pupils equal round and reactive to light and accommodation. Ears, Nose, Mouth, and Throat no gross abnormality of ear auricles or external auditory canals. normal hearing noted during  conversation. mucus membranes moist. Respiratory normal breathing without difficulty. clear to auscultation bilaterally. Cardiovascular regular rate and rhythm with normal S1, S2. 2+ dorsalis pedis/posterior tibialis pulses. no clubbing, cyanosis, significant edema, Gastrointestinal (GI) soft, non-tender, non-distended, +BS. no ventral hernia noted. Musculoskeletal Patient unable to walk without assistance. no significant deformity or arthritic changes, no loss or range of motion, no clubbing. Psychiatric this patient is able to make decisions and demonstrates good insight into disease process. Alert and Oriented x 3. pleasant and cooperative. General Notes: On inspection today patient's wound itself actually had evidence of slough noted on the surface of the wound. His blood pressure seem to be doing fairly well which is good news. He did have 2+ pulses in the dorsalis Peterson posterior tibial locations bilaterally. With that being said though he has good pulses he does have calcifications and there's a question as to whether or not this will indicate sufficient blood flow or not. His foot nonetheless was warm to palpation with no erythema noted and he had less than three second capillary refill which was also good news. Overall everything on physical exam DEAGLAN, REISER. ([id number]) would seem to indicate that he has good blood flow but again I do not want to take the chance with significant debridement at this point. Therefore I did perform debridement but this was more of a light debridement with some subcutaneous involvement but nothing to truly clear away all of the necrotic tissue overlying the subcutaneous tissue. I wanted to get the surface off so that we could work on the deeper levels with medication at least until we get confirmation that he has good arterial flow. Integumentary (Hair, Skin) Wound #1 status is Open. Original cause of wound was Pressure Injury. The wound is  located on the Right Calcaneus. The wound measures 2.3cm length x 2.2cm width x 0.1cm depth; 3.974cm^2 area and 0.397cm^3 volume. There is Fat Layer (Subcutaneous Tissue) Exposed exposed. There is no tunneling or undermining noted. There is a large amount of serous drainage noted. The wound margin is flat and intact. There is medium (34-66%) pink granulation within the wound bed. There is a medium (34-66%) amount of necrotic tissue within the wound bed including Adherent Slough. The periwound skin appearance exhibited: Maceration. The periwound skin appearance did not exhibit: Callus, Crepitus, Excoriation, Induration, Rash, Scarring, Dry/Scaly, Atrophie Blanche, Cyanosis, Ecchymosis, Hemosiderin Staining, Mottled, Pallor, Rubor, Erythema. Periwound temperature was noted as No Abnormality. The periwound has tenderness on palpation. Assessment Active Problems ICD-10 Pressure ulcer of right heel, stage 3 Essential (primary) hypertension Atherosclerosis of coronary artery bypass graft(s) without angina pectoris Presence of other specified functional implants Procedures Wound #1 Pre-procedure diagnosis of Wound #1 is a Pressure Ulcer located on the Right Calcaneus . There was a Excisional Skin/Subcutaneous Tissue Debridement with a total area of 5.06 sq cm performed by STONE III, HOYT E., PA-C. With the following instrument(s): Curette to remove Viable and Non-Viable tissue/material. Material removed includes Subcutaneous Tissue, Slough, and Biofilm after achieving pain control using Other (lidocaine 4%). No specimens were taken. A time out was conducted at 11:11, prior to the start of the procedure. A Minimum amount of bleeding was controlled  with Pressure. The procedure was tolerated well with a pain level of 0 throughout and a pain level of 0 following the procedure. Patient s Level of Consciousness post procedure was recorded as Awake and Alert. Post Debridement Measurements: 2.3cm length x  2.2cm width x 0.1cm depth; 0.397cm^3 volume. Post debridement Stage noted as Category/Stage II. Character of Wound/Ulcer Post Debridement is stable. Post procedure Diagnosis Wound #1: Same as Pre-Procedure Plan Wound Cleansing: Wound #1 Right Calcaneus: Clean wound with Normal Saline. Anesthetic (add to Medication List): CURTIS, CAIN ([id number]) Topical Lidocaine 4% cream applied to wound bed prior to debridement (In Clinic Only). Skin Barriers/Peri-Wound Care: Wound #1 Right Calcaneus: Skin Prep Primary Wound Dressing: Wound #1 Right Calcaneus: Iodoflex Secondary Dressing: Wound #1 Right Calcaneus: Boardered Foam Dressing Dressing Change Frequency: Wound #1 Right Calcaneus: Change dressing every other day. Follow-up Appointments: Wound #1 Right Calcaneus: Return Appointment in 1 week. Consults ordered were: Vascular -  VVS referral for bilateral TBI and ABI At this point my suggestion was gonna be for [place] to go ahead and make the referral for the bilateral arterial studies with ABI and TBI. The patient and his wife are in agreement the plan. Hermenia Fiscal subsequently see him back for reevaluation in one weeks time and in the meantime we will utilize Iodoflex to continue with debridement. In the meantime if anything changes they will let me know. Please see above for specific wound care orders. We will see patient for re-evaluation in 1 week(s) here in the clinic. If anything worsens or changes patient will contact our office for additional recommendations. Electronic Signature(s) Signed: 12/24/2017 12:06:38 PM By: Lenda Kelp PA-C Entered By: Lenda Kelp on 12/24/2017 12:06:19 Brendan [name] ([id number]) -------------------------------------------------------------------------------- ROS/PFSH Details Patient Name: Brendan [name] Date of Service: 12/24/2017 10:30 AM Medical Record Number: [id number] Patient Account Number: [id number] Date of Birth/Sex:  28-Aug-1940 (77 y.o. Male) Treating RN: Curtis Sites Primary Care Provider: [name] Other Clinician: Referring Provider: Fanny Bien Treating Provider/Extender: Linwood Dibbles, HOYT Weeks in Treatment: 0 Information Obtained From Patient Wound History Do you currently have one or more open woundso Yes How many open wounds do you currently haveo 1 Approximately how long have you had your woundso 1 month How have you been treating your wound(s) until nowo neosporin Has your wound(s) ever healed and then re-openedo No Have you had any lab work done in the past montho Yes Who ordered the lab work doneo [facility] Have you tested positive for an antibiotic resistant organism (MRSA, VRE)o No Have you tested positive for osteomyelitis (bone infection)o No Have you had any tests for circulation on your legso No Have you had other problems associated with your woundso Swelling Constitutional Symptoms (General Health) Complaints and Symptoms: Negative for: Fatigue; Fever; Chills; Marked Weight Change Eyes Complaints and Symptoms: Negative for: Dry Eyes; Vision Changes; Glasses / Contacts Medical History: Negative for: Cataracts; Glaucoma; Optic Neuritis Ear/Nose/Mouth/Throat Complaints and Symptoms: Negative for: Difficult clearing ears; Sinusitis Medical History: Negative for: Chronic sinus problems/congestion; Middle ear problems Hematologic/Lymphatic Complaints and Symptoms: No Complaints or Symptoms Complaints and Symptoms: Negative for: Bleeding / Clotting Disorders; Human Immunodeficiency Virus Medical History: Positive for: Anemia Negative for: Hemophilia; Human Immunodeficiency Virus; Lymphedema; Sickle Cell Disease Respiratory BRAYTEN, KOMAR. ([id number]) Complaints and Symptoms: Negative for: Chronic or frequent coughs; Shortness of Breath Medical History: Positive for: Chronic Obstructive Pulmonary Disease (COPD) Negative for: Aspiration; Asthma; Pneumothorax; Sleep  Apnea; Tuberculosis Cardiovascular Complaints and Symptoms: Positive for: LE edema  Negative for: Chest pain Medical History: Positive for: Coronary Artery Disease; Hypertension Past Medical History Notes: HLD Gastrointestinal Complaints and Symptoms: Negative for: Frequent diarrhea; Nausea; Vomiting Medical History: Negative for: Cirrhosis ; Colitis; Crohnos; Hepatitis A; Hepatitis B; Hepatitis C Endocrine Complaints and Symptoms: Negative for: Hepatitis; Thyroid disease; Polydypsia (Excessive Thirst) Medical History: Negative for: Type I Diabetes; Type II Diabetes Genitourinary Complaints and Symptoms: Negative for: Kidney failure/ Dialysis; Incontinence/dribbling Medical History: Negative for: End Stage Renal Disease Past Medical History Notes: self cath twice daily r/t retention Immunological Complaints and Symptoms: Negative for: Hives; Itching Medical History: Negative for: Lupus Erythematosus; Raynaudos; Scleroderma Integumentary (Skin) Complaints and Symptoms: Positive for: Wounds; Swelling Negative for: Bleeding or bruising tendency; Breakdown Medical History: JAMEIS, [place] ([id number]) Negative for: History of Burn; History of pressure wounds Musculoskeletal Complaints and Symptoms: Negative for: Muscle Pain; Muscle Weakness Medical History: Positive for: Osteoarthritis Negative for: Gout; Rheumatoid Arthritis; Osteomyelitis Neurologic Complaints and Symptoms: Negative for: Numbness/parasthesias; Focal/Weakness Medical History: Positive for: Neuropathy Negative for: Dementia; Quadriplegia; Paraplegia; Seizure Disorder Past Medical History Notes: 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, 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 Psychiatric Complaints and Symptoms: Negative for: Anxiety; Claustrophobia Medical History: Negative for: Anorexia/bulimia; Confinement Anxiety Oncologic Medical History: Negative for: Received Chemotherapy; Received  Radiation Immunizations Pneumococcal Vaccine: Received Pneumococcal Vaccination: Yes Implantable Devices Family and Social History Cancer: No; Diabetes: No; Heart Disease: Yes - Siblings; Hereditary Spherocytosis: No; Hypertension: Yes - Siblings; Kidney Disease: No; Lung Disease: No; Seizures: No; Stroke: No; Thyroid Problems: No; Tuberculosis: No; Never smoker; Marital Status - Married; Alcohol Use: Never; Drug Use: No History; Caffeine Use: Rarely; Financial Concerns: No; Food, Clothing or Shelter Needs: No; Support System Lacking: No; Transportation Concerns: No; Advanced Directives: No; Patient does not want information on Advanced Directives Electronic Signature(s) Signed: 12/24/2017 12:06:38 PM By: Lenda Kelp PA-C Signed: 12/24/2017 5:04:31 PM By: Curtis Sites Entered By: Curtis Sites on 12/24/2017 10:45:12 Brendan [name] ([id number]) -------------------------------------------------------------------------------- SuperBill Details Patient Name: Brendan [name] Date of Service: 12/24/2017 Medical Record Number: [id number] Patient Account Number: [id number] Date of Birth/Sex: 1940-10-20 (77 y.o. 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 Z96.89 Presence of other specified functional implants Facility Procedures CPT4 Code: 41324401 Description: 02725 - WOUND CARE VISIT-LEV 2 EST PT Modifier: Quantity: 1 CPT4 Code: 36644034 Description: 11042 - DEB SUBQ TISSUE 20 SQ CM/< ICD-10 Diagnosis Description L89.613 Pressure ulcer of right heel, stage 3 Modifier: Quantity: 1 Physician Procedures CPT4 Code:  7425956 Description: 99204 - WC PHYS LEVEL 4 - NEW PT ICD-10 Diagnosis Description L89.613 Pressure ulcer of right heel, stage 3 I10 Essential (primary) hypertension I25.810 Atherosclerosis of coronary artery bypass graft(s) without a Z96.89 Presence of other  specified functional implants Modifier: 25 ngina pectoris Quantity: 1 CPT4 Code: 3875643 Description: 11042 - WC PHYS SUBQ TISS 20 SQ CM ICD-10 Diagnosis Description L89.613 Pressure ulcer of right heel, stage 3 Modifier: Quantity: 1 Electronic Signature(s) Signed: 12/24/2017 12:06:38 PM By: Lenda Kelp PA-C Entered By: Lenda Kelp on 12/24/2017 12:03:14

## 2017-12-25 NOTE — Progress Notes (Signed)
HERSHALL, BENKERT ([id number]) Visit Report for 12/24/2017 Allergy List Details Patient Name: Brendan [name], Brendan [name] Date of Service: 12/24/2017 10:30 AM Medical Record Number: [id number] Patient Account Number: [id number] Date of Birth/Sex: 08/20/1940 (77 y.o. Male) Treating RN: Curtis Sites Primary Care Bubber Rothert: [name] Other Clinician: Referring Akiba Melfi: Fanny Bien Treating Gorgeous Newlun/Extender: STONE III, HOYT Weeks in Treatment: 0 Allergies Active Allergies No Known Allergies Allergy Notes Electronic Signature(s) Signed: 12/24/2017 5:04:31 PM By: Curtis Sites Entered By: Curtis Sites on 12/24/2017 10:29:35 Brendan [name] ([id number]) -------------------------------------------------------------------------------- Arrival Information Details Patient Name: Brendan [name] Date of Service: 12/24/2017 10:30 AM Medical Record Number: [id number] Patient Account Number: [id number] Date of Birth/Sex: 08/03/1940 (77 y.o. Male) Treating RN: Curtis Sites Primary Care Krisandra Bueno: [name] Other Clinician: Referring Avleen Bordwell: Fanny Bien Treating Shyhiem Beeney/Extender: Linwood Dibbles, HOYT Weeks in Treatment: 0 Visit Information Patient Arrived: Walker Arrival Time: 10:23 Accompanied By: wife Transfer Assistance: None Patient Identification Verified: Yes Secondary Verification Process Completed: Yes Electronic Signature(s) Signed: 12/24/2017 5:04:31 PM By: Curtis Sites Entered By: Curtis Sites on 12/24/2017 10:24:47 Brendan [name] ([id number]) -------------------------------------------------------------------------------- Clinic Level of Care Assessment Details Patient Name: Brendan [name] Date of Service: 12/24/2017 10:30 AM Medical Record Number: [id number] Patient Account Number: [id number] Date of Birth/Sex: [birth year] (77 y.o. Male) Treating RN: Renne Crigler Primary Care Dorsie Sethi: [name] Other Clinician: Referring Anastaisa Wooding: Fanny Bien Treating  Harleigh Civello/Extender: Linwood Dibbles, HOYT Weeks in Treatment: 0 Clinic Level of Care Assessment Items TOOL 1 Quantity Score X - Use when EandM and Procedure is performed on INITIAL visit 1 0 ASSESSMENTS - Nursing Assessment / Reassessment X - General Physical Exam (combine w/ comprehensive assessment (listed just below) when 1 20 performed on new pt. evals) X- 1 25 Comprehensive Assessment (HX, ROS, Risk Assessments, Wounds Hx, etc.) ASSESSMENTS - Wound and Skin Assessment / Reassessment []  - Dermatologic / Skin Assessment (not related to wound area) 0 ASSESSMENTS - Ostomy and/or Continence Assessment and Care []  - Incontinence Assessment and Management 0 []  - 0 Ostomy Care Assessment and Management (repouching, etc.) PROCESS - Coordination of Care X - Simple Patient / Family Education for ongoing care 1 15 []  - 0 Complex (extensive) Patient / Family Education for ongoing care []  - 0 Staff obtains Chiropractor, Records, Test Results / Process Orders []  - 0 Staff telephones HHA, Nursing Homes / Clarify orders / etc []  - 0 Routine Transfer to another Facility (non-emergent condition) []  - 0 Routine Hospital Admission (non-emergent condition) []  - 0 New Admissions / Manufacturing engineer / Ordering NPWT, Apligraf, etc. []  - 0 Emergency Hospital Admission (emergent condition) PROCESS - Special Needs []  - Pediatric / Minor Patient Management 0 []  - 0 Isolation Patient Management []  - 0 Hearing / Language / Visual special needs []  - 0 Assessment of Community assistance (transportation, D/C planning, etc.) []  - 0 Additional assistance / Altered mentation []  - 0 Support Surface(s) Assessment (bed, cushion, seat, etc.) Brendan [name] ([id number]) INTERVENTIONS - Miscellaneous []  - External ear exam 0 []  - 0 Patient Transfer (multiple staff / Nurse, adult / Similar devices) []  - 0 Simple Staple / Suture removal (25 or less) []  - 0 Complex Staple / Suture removal (26 or more) []  -  0 Hypo/Hyperglycemic Management (do not check if billed separately) X- 1 15 Ankle / Brachial Index (ABI) - do not check if billed separately Has the patient been seen at the hospital within the last three years: Yes Total Score: 75 Level Of Care: New/Established - Level 2  Electronic Signature(s) Signed: 12/24/2017 4:39:16 PM By: Renne Crigler Entered By: Renne Crigler on 12/24/2017 11:16:03 Brendan [name] ([id number]) -------------------------------------------------------------------------------- Encounter Discharge Information Details Patient Name: Brendan [name] Date of Service: 12/24/2017 10:30 AM Medical Record Number: [id number] Patient Account Number: [id number] Date of Birth/Sex: 19-Mar-1941 (77 y.o. Male) Treating RN: Curtis Sites Primary Care Caitlyn Buchanan: [name] Other Clinician: Referring Audrionna Lampton: Fanny Bien Treating Stephannie Broner/Extender: Linwood Dibbles, HOYT Weeks in Treatment: 0 Encounter Discharge Information Items Discharge Condition: Stable Ambulatory Status: Ambulatory Discharge Destination: Home Transportation: Private Auto Accompanied By: spouse Schedule Follow-up Appointment: Yes Clinical Summary of Care: Electronic Signature(s) Signed: 12/24/2017 12:08:51 PM By: Curtis Sites Entered By: Curtis Sites on 12/24/2017 12:08:51 Brendan [name] ([id number]) -------------------------------------------------------------------------------- Lower Extremity Assessment Details Patient Name: Brendan [name] Date of Service: 12/24/2017 10:30 AM Medical Record Number: [id number] Patient Account Number: [id number] Date of Birth/Sex: 12-26-1940 (77 y.o. Male) Treating RN: Curtis Sites Primary Care Kalayah Leske: [name] Other Clinician: Referring Aliece Honold: Fanny Bien Treating Pete Merten/Extender: Linwood Dibbles, HOYT Weeks in Treatment: 0 Edema Assessment Assessed: [Left: No] [Right: No] [Left: Edema] [Right: :] Calf Left: Right: Point of Measurement: 35 cm  From Medial Instep 37 cm 38 cm Ankle Left: Right: Point of Measurement: 12 cm From Medial Instep 23 cm 24 cm Vascular Assessment Claudication: Claudication Assessment [Left:None] [Right:None] Pulses: Dorsalis Pedis Palpable: [Left:Yes] [Right:Yes] Posterior Tibial Extremity colors, hair growth, and conditions: Extremity Color: [Left:Normal] [Right:Pale] Hair Growth on Extremity: [Left:No] [Right:No] Temperature of Extremity: [Left:Cool] [Right:Cool] Capillary Refill: [Left:< 3 seconds] [Right:< 3 seconds] Blood Pressure: Brachial: [Left:158] Dorsalis Pedis: 188 [Left:Dorsalis Pedis: 226] Ankle: Posterior Tibial: 244 [Left:Posterior Tibial: 230 1.54] [Right:1.46] Toe Nail Assessment Left: Right: Thick: No No Discolored: No No Deformed: No No Improper Length and Hygiene: No No Electronic Signature(s) Signed: 12/24/2017 5:04:31 PM By: Curtis Sites Entered By: Curtis Sites on 12/24/2017 10:56:58 Brendan [name] ([id number]) -------------------------------------------------------------------------------- Multi Wound Chart Details Patient Name: Brendan [name] Date of Service: 12/24/2017 10:30 AM Medical Record Number: [id number] Patient Account Number: [id number] Date of Birth/Sex: 03/07/1941 (77 y.o. Male) Treating RN: Renne Crigler Primary Care Kamrin Sibley: [name] Other Clinician: Referring Jemery Stacey: Fanny Bien Treating Xitlali Kastens/Extender: STONE III, HOYT Weeks in Treatment: 0 Vital Signs Height(in): 66 Pulse(bpm): 64 Weight(lbs): 210 Blood Pressure(mmHg): 110/82 Body Mass Index(BMI): 34 Temperature(F): 98.0 Respiratory Rate 18 (breaths/min): Photos: [1:No Photos] [N/A:N/A] Wound Location: [1:Right Calcaneus] [N/A:N/A] Wounding Event: [1:Pressure Injury] [N/A:N/A] Primary Etiology: [1:Pressure Ulcer] [N/A:N/A] Comorbid History: [1:Anemia, Chronic Obstructive Pulmonary Disease (COPD), Coronary Artery Disease, Hypertension, Osteoarthritis, Neuropathy]  [N/A:N/A] Date Acquired: [1:11/19/2017] [N/A:N/A] Weeks of Treatment: [1:0] [N/A:N/A] Wound Status: [1:Open] [N/A:N/A] Measurements L x W x D [1:2.3x2.2x0.1] [N/A:N/A] (cm) Area (cm) : [1:3.974] [N/A:N/A] Volume (cm) : [1:0.397] [N/A:N/A] Classification: [1:Category/Stage II] [N/A:N/A] Exudate Amount: [1:Large] [N/A:N/A] Exudate Type: [1:Serous] [N/A:N/A] Exudate Color: [1:amber] [N/A:N/A] Wound Margin: [1:Flat and Intact] [N/A:N/A] Granulation Amount: [1:Medium (34-66%)] [N/A:N/A] Granulation Quality: [1:Pink] [N/A:N/A] Necrotic Amount: [1:Medium (34-66%)] [N/A:N/A] Exposed Structures: [1:Fat Layer (Subcutaneous Tissue) Exposed: Yes Fascia: No Tendon: No Muscle: No Joint: No Bone: No] [N/A:N/A] Epithelialization: [1:None] [N/A:N/A] Periwound Skin Texture: [1:Excoriation: No Induration: No Callus: No Crepitus: No] [N/A:N/A] Rash: No Scarring: No Periwound Skin Moisture: Maceration: Yes N/A N/A Dry/Scaly: No Periwound Skin Color: Atrophie Blanche: No N/A N/A Cyanosis: No Ecchymosis: No Erythema: No Hemosiderin Staining: No Mottled: No Pallor: No Rubor: No Temperature: No Abnormality N/A N/A Tenderness on Palpation: Yes N/A N/A Wound Preparation: Ulcer Cleansing: N/A N/A Rinsed/Irrigated with Saline Topical Anesthetic Applied: Other: lidocaine 4% Treatment Notes Electronic  Signature(s) Signed: 12/24/2017 4:39:16 PM By: Renne Crigler Entered By: Renne Crigler on 12/24/2017 11:10:04 Brendan [name] ([id number]) -------------------------------------------------------------------------------- Multi-Disciplinary Care Plan Details Patient Name: Brendan [name] Date of Service: 12/24/2017 10:30 AM Medical Record Number: [id number] Patient Account Number: [id number] Date of Birth/Sex: 1940/12/18 (77 y.o. Male) Treating RN: Renne Crigler Primary Care Benjamin Casanas: [name] Other Clinician: Referring Saniyah Mondesir: Fanny Bien Treating Daily Crate/Extender: Linwood Dibbles,  HOYT Weeks in Treatment: 0 Active Inactive ` Orientation to the Wound Care Program Nursing Diagnoses: Knowledge deficit related to the wound healing center program Goals: Patient/caregiver will verbalize understanding of the Wound Healing Center Program Date Initiated: 12/24/2017 Target Resolution Date: 01/14/2018 Goal Status: Active Interventions: Provide education on orientation to the wound center Notes: ` Wound/Skin Impairment Nursing Diagnoses: Impaired tissue integrity Goals: Patient/caregiver will verbalize understanding of skin care regimen Date Initiated: 12/24/2017 Target Resolution Date: 01/14/2018 Goal Status: Active Ulcer/skin breakdown will have a volume reduction of 30% by week 4 Date Initiated: 12/24/2017 Target Resolution Date: 01/14/2018 Goal Status: Active Interventions: Assess patient/caregiver ability to obtain necessary supplies Assess patient/caregiver ability to perform ulcer/skin care regimen upon admission and as needed Assess ulceration(s) every visit Treatment Activities: Skin care regimen initiated : 12/24/2017 Notes: Electronic Signature(s) Signed: 12/24/2017 4:39:16 PM By: Artemio Aly, Landis Martins ([id number]) Entered By: Renne Crigler on 12/24/2017 11:09:50 Brendan [name] ([id number]) -------------------------------------------------------------------------------- Pain Assessment Details Patient Name: Brendan [name] Date of Service: 12/24/2017 10:30 AM Medical Record Number: [id number] Patient Account Number: [id number] Date of Birth/Sex: Feb 16, 1941 (77 y.o. Male) Treating RN: Curtis Sites Primary Care Satoya Feeley: [name] Other Clinician: Referring Eric Morganti: Fanny Bien Treating Jhana Giarratano/Extender: STONE III, HOYT Weeks in Treatment: 0 Active Problems Location of Pain Severity and Description of Pain Patient Has Paino Yes Site Locations Pain Location: Pain in Ulcers Pain Management and Medication Current Pain  Management: Electronic Signature(s) Signed: 12/24/2017 5:04:31 PM By: Curtis Sites Entered By: Curtis Sites on 12/24/2017 10:25:44 Brendan [name] ([id number]) -------------------------------------------------------------------------------- Patient/Caregiver Education Details Patient Name: Brendan [name] Date of Service: 12/24/2017 10:30 AM Medical Record Number: [id number] Patient Account Number: [id number] Date of Birth/Gender: Dec 13, 1940 (77 y.o. Male) Treating RN: Curtis Sites Primary Care Physician: [name] Other Clinician: Referring Physician: Fanny Bien Treating Physician/Extender: Linwood Dibbles, HOYT Weeks in Treatment: 0 Education Assessment Education Provided To: Patient and Caregiver Education Topics Provided Wound/Skin Impairment: Handouts: Other: wound care as ordered Methods: Demonstration, Explain/Verbal Responses: State content correctly Electronic Signature(s) Signed: 12/24/2017 5:04:31 PM By: Curtis Sites Entered By: Curtis Sites on 12/24/2017 12:09:10 Brendan [name] ([id number]) -------------------------------------------------------------------------------- Wound Assessment Details Patient Name: Brendan [name] Date of Service: 12/24/2017 10:30 AM Medical Record Number: [id number] Patient Account Number: [id number] Date of Birth/Sex: 05/03/1940 (77 y.o. Male) Treating RN: Curtis Sites Primary Care Fount Bahe: [name] Other Clinician: Referring Jashad Depaula: Fanny Bien Treating Ritchie Klee/Extender: STONE III, HOYT Weeks in Treatment: 0 Wound Status Wound Number: 1 Primary Pressure Ulcer Etiology: Wound Location: Right Calcaneus Wound Open Wounding Event: Pressure Injury Status: Date Acquired: 11/19/2017 Comorbid Anemia, Chronic Obstructive Pulmonary Weeks Of Treatment: 0 History: Disease (COPD), Coronary Artery Disease, Clustered Wound: No Hypertension, Osteoarthritis, Neuropathy Photos Photo Uploaded By: Curtis Sites on  12/24/2017 17:05:19 Wound Measurements Length: (cm) 2.3 Width: (cm) 2.2 Depth: (cm) 0.1 Area: (cm) 3.974 Volume: (cm) 0.397 % Reduction in Area: 0% % Reduction in Volume: 0% Epithelialization: None Tunneling: No Undermining: No Wound Description Classification: Category/Stage III Wound Margin: Flat and Intact Exudate Amount: Large Exudate Type: Serous Exudate Color: amber Foul Odor After Cleansing:  No Slough/Fibrino Yes Wound Bed Granulation Amount: Medium (34-66%) Exposed Structure Granulation Quality: Pink Fascia Exposed: No Necrotic Amount: Medium (34-66%) Fat Layer (Subcutaneous Tissue) Exposed: Yes Necrotic Quality: Adherent Slough Tendon Exposed: No Muscle Exposed: No Joint Exposed: No Bone Exposed: No Periwound Skin Texture Brendan [name], DIVIN. ([id number]) Texture Color No Abnormalities Noted: No No Abnormalities Noted: No Callus: No Atrophie Blanche: No Crepitus: No Cyanosis: No Excoriation: No Ecchymosis: No Induration: No Erythema: No Rash: No Hemosiderin Staining: No Scarring: No Mottled: No Pallor: No Moisture Rubor: No No Abnormalities Noted: No Dry / Scaly: No Temperature / Pain Maceration: Yes Temperature: No Abnormality Tenderness on Palpation: Yes Wound Preparation Ulcer Cleansing: Rinsed/Irrigated with Saline Topical Anesthetic Applied: Other: lidocaine 4%, Treatment Notes Wound #1 (Right Calcaneus) 1. Cleansed with: Clean wound with Normal Saline 2. Anesthetic Topical Lidocaine 4% cream to wound bed prior to debridement 3. Peri-wound Care: Skin Prep 4. Dressing Applied: Iodoflex 5. Secondary Dressing Applied Bordered Foam Dressing Notes netting Electronic Signature(s) Signed: 12/24/2017 4:39:16 PM By: Renne Crigler Signed: 12/24/2017 5:04:31 PM By: Curtis Sites Entered By: Renne Crigler on 12/24/2017 11:13:55 Brendan [name]  ([id number]) -------------------------------------------------------------------------------- Vitals Details Patient Name: Brendan [name] Date of Service: 12/24/2017 10:30 AM Medical Record Number: [id number] Patient Account Number: [id number] Date of Birth/Sex: 1940/12/31 (77 y.o. Male) Treating RN: Curtis Sites Primary Care Namine Beahm: [name] Other Clinician: Referring Corney Knighton: Fanny Bien Treating Jaylenne Hamelin/Extender: STONE III, HOYT Weeks in Treatment: 0 Vital Signs Time Taken: 10:28 Temperature (F): 98.0 Height (in): 66 Pulse (bpm): 64 Source: Measured Respiratory Rate (breaths/min): 18 Weight (lbs): 210 Blood Pressure (mmHg): 110/82 Source: Measured Reference Range: 80 - 120 mg / dl Body Mass Index (BMI): 33.9 Electronic Signature(s) Signed: 12/24/2017 5:04:31 PM By: Curtis Sites Entered By: Curtis Sites on 12/24/2017 40:76:80

## 2017-12-26 ENCOUNTER — Telehealth: Payer: Self-pay | Admitting: Neurology

## 2017-12-26 NOTE — Telephone Encounter (Signed)
Patient's wife is calling back and left a voicemail needing to speak with a nurse. Please call her at [phone]. Thanks!

## 2017-12-26 NOTE — Telephone Encounter (Signed)
No.  That will be fine.

## 2017-12-26 NOTE — Telephone Encounter (Signed)
Patient wife would like to talk to Dr Tat. She needs to know if she needs to have his DBS unhook for some test that he is having done

## 2017-12-26 NOTE — Telephone Encounter (Signed)
Patient is having a test done at the urologist.  (EMG patch electrode)  Does wife need to turn off DBS?

## 2017-12-27 NOTE — Telephone Encounter (Signed)
Patient's wife notified that she does not need to turn the DBS off.

## 2017-12-28 ENCOUNTER — Encounter: Payer: Self-pay | Admitting: Physical Medicine & Rehabilitation

## 2017-12-28 ENCOUNTER — Encounter: Payer: PPO | Attending: Registered Nurse | Admitting: Physical Medicine & Rehabilitation

## 2017-12-28 ENCOUNTER — Other Ambulatory Visit: Payer: Self-pay

## 2017-12-28 VITALS — BP 121/71 | HR 61 | Ht 66.0 in | Wt 212.8 lb

## 2017-12-28 DIAGNOSIS — R451 Restlessness and agitation: Secondary | ICD-10-CM | POA: Insufficient documentation

## 2017-12-28 DIAGNOSIS — F419 Anxiety disorder, unspecified: Secondary | ICD-10-CM | POA: Diagnosis not present

## 2017-12-28 DIAGNOSIS — Z87891 Personal history of nicotine dependence: Secondary | ICD-10-CM | POA: Insufficient documentation

## 2017-12-28 DIAGNOSIS — G479 Sleep disorder, unspecified: Secondary | ICD-10-CM

## 2017-12-28 DIAGNOSIS — Z8249 Family history of ischemic heart disease and other diseases of the circulatory system: Secondary | ICD-10-CM | POA: Insufficient documentation

## 2017-12-28 DIAGNOSIS — Z955 Presence of coronary angioplasty implant and graft: Secondary | ICD-10-CM | POA: Diagnosis not present

## 2017-12-28 DIAGNOSIS — G250 Essential tremor: Secondary | ICD-10-CM | POA: Diagnosis not present

## 2017-12-28 DIAGNOSIS — Z9889 Other specified postprocedural states: Secondary | ICD-10-CM | POA: Diagnosis not present

## 2017-12-28 DIAGNOSIS — I619 Nontraumatic intracerebral hemorrhage, unspecified: Secondary | ICD-10-CM

## 2017-12-28 DIAGNOSIS — I25810 Atherosclerosis of coronary artery bypass graft(s) without angina pectoris: Secondary | ICD-10-CM | POA: Insufficient documentation

## 2017-12-28 DIAGNOSIS — F09 Unspecified mental disorder due to known physiological condition: Secondary | ICD-10-CM | POA: Diagnosis not present

## 2017-12-28 DIAGNOSIS — R339 Retention of urine, unspecified: Secondary | ICD-10-CM

## 2017-12-28 DIAGNOSIS — I10 Essential (primary) hypertension: Secondary | ICD-10-CM | POA: Diagnosis not present

## 2017-12-28 DIAGNOSIS — N319 Neuromuscular dysfunction of bladder, unspecified: Secondary | ICD-10-CM | POA: Diagnosis not present

## 2017-12-28 DIAGNOSIS — R1310 Dysphagia, unspecified: Secondary | ICD-10-CM | POA: Diagnosis not present

## 2017-12-28 DIAGNOSIS — G8110 Spastic hemiplegia affecting unspecified side: Secondary | ICD-10-CM | POA: Diagnosis not present

## 2017-12-28 MED ORDER — METHOCARBAMOL 500 MG PO TABS: ORAL_TABLET | ORAL | 1 refills | Status: DC

## 2017-12-28 MED ORDER — TRAZODONE HCL 50 MG PO TABS: 50.0000 mg | ORAL_TABLET | Freq: Every day | ORAL | 1 refills | Status: DC

## 2017-12-28 NOTE — Progress Notes (Addendum)
Subjective:    Patient ID: Brendan [name], male    DOB: 11-14-1940, 77 y.o.   MRN: [id number]  HPI 77 year old right-handed male with history of CAD and stenting as well as CABG, memory loss with bilateral deep brain stimulator placement presents for follow up for left frontal intraparenchymal hemorrhage, DBS, debility.  Last clinic visit 09/28/17.  Wife provides majority of history. Since that time, he had his battery hooked up end of July.  He is not doing exercises.  He sees Neurosurg next week. He sees Neurology in 2 weeks. He no longer has pain. His tremor has nearly resolved. He has been taking Trazodone and muscle relaxer with benefit. He is following up with Urology, still requiring I/O caths, but less frequently.    Pain Inventory Average Pain 0 Pain Right Now 0 My pain is sharp and aching  In the last 24 hours, has pain interfered with the following? General activity 5 Relation with others 0 Enjoyment of life 3 What TIME of day is your pain at its worst? night Sleep (in general) Poor  Pain is worse with: walking, sitting and some activites Pain improves with: medication Relief from Meds: 2  Mobility use a walker ability to climb steps?  yes do you drive?  no  Function retired  Neuro/Psych bladder control problems weakness tremor trouble walking dizziness confusion  Prior Studies Any changes since last visit?  no  Physicians involved in your care Any changes since last visit?  no   Family History  Problem Relation Age of Onset  . Heart disease Mother   . Heart disease Brother   . Tremor Paternal Uncle   . Heart disease Brother   . Drug abuse Daughter   . Hepatitis C Daughter    Social History   Socioeconomic History  . Marital status: Married    Spouse name: [name]  . Number of children: 2  . Years of education: 10  . Highest education level: Not on file  Occupational History  . Occupation: retired    Comment: Geneticist, molecular - Midwife x 30 years  Social Needs  . Financial resource strain: Not hard at all  . Food insecurity:    Worry: Never true    Inability: Never true  . Transportation needs:    Medical: No    Non-medical: No  Tobacco Use  . Smoking status: Former Smoker    Packs/day: 3.00    Years: 20.00    Pack years: 60.00    Types: Cigarettes    Last attempt to quit: 04/17/1968    Years since quitting: 49.7  . Smokeless tobacco: Never Used  Substance and Sexual Activity  . Alcohol use: No    Frequency: Never  . Drug use: No  . Sexual activity: Never  Lifestyle  . Physical activity:    Days per week: 0 days    Minutes per session: 0 min  . Stress: Only a little  Relationships  . Social connections:    Talks on phone: More than three times a week    Gets together: Three times a week    Attends religious service: Patient refused    Active member of club or organization: Patient refused    Attends meetings of clubs or organizations: Patient refused    Relationship status: Married  Other Topics Concern  . Not on file  Social History Narrative   Married.  Wife is Royal Piedra.   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  Former smoker, quit 20 years ago. Does not drink alcohol.   Does not get routine exercise, walks sometimes with a cane.   Caffeine use: diet-soda   Left handed    Past Surgical History:  Procedure Laterality Date  . CARDIAC CATHETERIZATION    . CATARACT EXTRACTION, BILATERAL    . CORONARY ARTERY BYPASS GRAFT  06/2002   LIMA-LAD, SVG-D1, SVG-OM  . EYE SURGERY    . KNEE SURGERY Left   . LEFT HEART CATHETERIZATION WITH CORONARY/GRAFT ANGIOGRAM N/A 10/13/2011   Procedure: LEFT HEART CATHETERIZATION WITH Isabel CapriceORONARY/GRAFT ANGIOGRAM;  Surgeon: Marykay Lexavid W Harding, MD;  [facility];  Service: Cardiovascular;  Laterality: N/A;  . MINOR PLACEMENT OF FIDUCIAL N/A 04/26/2017   Procedure: Fiducial placement;  Surgeon: Maeola HarmanStern, Joseph, MD;  [facility];  Service:  Neurosurgery;  Laterality: N/A;  Fiducial placement  . NM MYOCAR PERF WALL MOTION  03/23/2009   protocol:Bruce, normal perfusion in all regions, post-stress EF 72%, exercise capacity 7METS, EKG negative for ischemia.  [name]. PULSE GENERATOR IMPLANT Bilateral 11/13/2017   Procedure: Bilateral Implantable pulse generator;  Surgeon: Maeola HarmanStern, Joseph, MD;  [name];  Service: Neurosurgery;  Laterality: Bilateral;  Bilateral Implantable pulse generator  . Shoulder Orthoscopic Surgery   06/2003  . SUBTHALAMIC STIMULATOR INSERTION Bilateral 05/04/2017   Procedure: Bilateral Deep brain stimulator placement;  Surgeon: Maeola HarmanStern, Joseph, MD;  [facility];  Service: Neurosurgery;  Laterality: Bilateral;  Bilateral deep brain stimulator placement  . TRANSTHORACIC ECHOCARDIOGRAM  04/2017   Jan 2019: Normal LV size and function.  EF 55-60%.  No or W MA.  Mild LA dilation.  . wrist ganglion cyst Left    Past Medical History:  Diagnosis Date  . Anxiety disorder   . Benign enlargement of prostate   . CAD in native artery 1997   Referred for CABG x3 in 2004 for LAD diagonal bifurcation lesion  . Cervical spinal stenosis   . Complex regional pain syndrome of right upper extremity    Right wrist; L arm  . Coronary atherosclerosis of artery bypass graft June 2013   Occluded SVG-D1; Cardiologist Dr. Herbie BaltimoreHarding  . Gait disorder   . Gastroesophageal reflux disease   . Glaucoma   . Hiatal hernia    GI: Dr Evette CristalGanem  . Hyperlipidemia LDL goal <70   . Hypertension   . Memory loss    Mild  . Obesity   . Peripheral neuropathy    possible peripheral neuropathy  . S/P CABG x 3 2004    LIMA-LAD, SVG to diagonal, SVG to OM  . Tremor, essential    On Primidone  . Unstable angina pectoris [facility](HCC)  June 2013   Cardiac cath: occluded SVG-DI. Patent LIMA-LAD and SVG-OM. EF 45% with apical inferior HK.   BP 121/71   Pulse 61   Ht 5\' 6"  (1.676 m)   Wt 212 lb 12.8 oz (96.5 kg)   SpO2 93%   BMI 34.35 kg/m   Opioid Risk  Score:   Fall Risk Score:  `1  Depression screen PHQ 2/9  Depression screen [facility] 12/28/2017 12/05/2017 11/28/2017 08/29/2017 08/17/2017 07/24/2017 06/14/2017  Decreased Interest 0 0 0 0 0 0 0  Down, Depressed, Hopeless 0 0 0 0 0 0 0  PHQ - 2 Score 0 0 0 0 0 0 0  Some recent data might be hidden     Review of Systems  Constitutional: Positive for diaphoresis.  HENT: Negative.   Eyes: Negative.   Respiratory:  Negative.   Cardiovascular: Negative.   Gastrointestinal: Negative.   Endocrine: Negative.   Genitourinary: Negative.   Musculoskeletal: Positive for gait problem.  Skin: Negative.   Allergic/Immunologic: Negative.   Neurological: Positive for dizziness, tremors and weakness.  Hematological: Negative.   Psychiatric/Behavioral: Positive for confusion.  All other systems reviewed and are negative.      Objective:   Physical Exam General: NAD. Well-developed.  HEENT: Normocephalic, atraumatic. Cardio: RRR. No JVD. Resp: CTA bilaterally. Normal effort   GI: BS+ and ND Musc/Skel:  No edema, no tenderness. Gait: Wide based Neuro: Alert and oriented Aphasia continues to improve Motor:  RUE: 5/5 proximal to distal RLE: 5/5 proximal to distal No ataxia b/l UE Minimal tremor Skin:   Intact. Warm and dry    Assessment & Plan:  77 year old right-handed male with history of CAD and stenting as well as CABG, memory loss with bilateral deep brain stimulator placement presents for follow up for left frontal intraparenchymal hemorrhage, DBS, debility.  1. Decreased functional mobility secondary to left frontal intraparenchymal hemorrhage with recent deep brain stimulator for tremor.    Encouraged HEP  Cont follow up with Neurology, Neurosurg  Significantly improved with functional DBS - long process with complications, patient believes it was all worth it, wife disagrees  2. Pain : Resolved   Mainly in Right leg at present  Neurontin being weaned by Neurology  Cont Robaxin  500 BID PRN  Cont tylenol as needed  3. Tremors:  Significantly improved after DBS with battery  4. Sleep disturbance    Cont Melatonin   Increased Trazodone to 50 qhs, reminded  5.  Neurogenic bladder  Improving  Cont Flomax   Cont follow up with Urology  Cont I/O caths, less frequently

## 2017-12-29 DIAGNOSIS — R339 Retention of urine, unspecified: Secondary | ICD-10-CM | POA: Diagnosis not present

## 2017-12-29 DIAGNOSIS — I619 Nontraumatic intracerebral hemorrhage, unspecified: Secondary | ICD-10-CM | POA: Diagnosis not present

## 2017-12-29 DIAGNOSIS — G8110 Spastic hemiplegia affecting unspecified side: Secondary | ICD-10-CM | POA: Diagnosis not present

## 2017-12-31 ENCOUNTER — Ambulatory Visit (INDEPENDENT_AMBULATORY_CARE_PROVIDER_SITE_OTHER): Payer: PPO

## 2017-12-31 DIAGNOSIS — G250 Essential tremor: Secondary | ICD-10-CM | POA: Diagnosis not present

## 2017-12-31 DIAGNOSIS — Z23 Encounter for immunization: Secondary | ICD-10-CM

## 2017-12-31 DIAGNOSIS — Z6834 Body mass index (BMI) 34.0-34.9, adult: Secondary | ICD-10-CM | POA: Diagnosis not present

## 2017-12-31 DIAGNOSIS — I10 Essential (primary) hypertension: Secondary | ICD-10-CM | POA: Diagnosis not present

## 2017-12-31 NOTE — Progress Notes (Signed)
Patient ID: Brendan [name], male   DOB: 03/09/1941, 77 y.o.   MRN: [id number] Patient here for flu shot. On his way out he tripped over the pavement and fell. He denies dizziness or pain, no headache. He hit his face against the graveled floor and some bruises on his left knee and hand. We called EMS for ED evaluation. He was awake and alert with stable vitals. ED evaluation recommended.

## 2017-12-31 NOTE — Progress Notes (Signed)
Flu shot documented on Immunization clinic encounter. Ples SpecterAlisa Brake, RN [facility])

## 2018-01-02 ENCOUNTER — Ambulatory Visit (HOSPITAL_COMMUNITY)
Admission: RE | Admit: 2018-01-02 | Discharge: 2018-01-02 | Disposition: A | Discharge status: Home / Self Care | Payer: PPO | Source: Ambulatory Visit | Attending: Vascular Surgery | Admitting: Vascular Surgery

## 2018-01-02 ENCOUNTER — Ambulatory Visit (INDEPENDENT_AMBULATORY_CARE_PROVIDER_SITE_OTHER)
Admission: RE | Admit: 2018-01-02 | Discharge: 2018-01-02 | Disposition: A | Discharge status: Home / Self Care | Payer: PPO | Source: Ambulatory Visit | Attending: Vascular Surgery | Admitting: Vascular Surgery

## 2018-01-02 ENCOUNTER — Other Ambulatory Visit: Payer: Self-pay | Admitting: Physician Assistant

## 2018-01-02 DIAGNOSIS — L89613 Pressure ulcer of right heel, stage 3: Secondary | ICD-10-CM | POA: Diagnosis not present

## 2018-01-04 ENCOUNTER — Encounter: Payer: PPO | Admitting: Physician Assistant

## 2018-01-04 DIAGNOSIS — I25810 Atherosclerosis of coronary artery bypass graft(s) without angina pectoris: Secondary | ICD-10-CM | POA: Diagnosis not present

## 2018-01-04 DIAGNOSIS — Z9689 Presence of other specified functional implants: Secondary | ICD-10-CM | POA: Diagnosis not present

## 2018-01-04 DIAGNOSIS — G629 Polyneuropathy, unspecified: Secondary | ICD-10-CM | POA: Diagnosis not present

## 2018-01-04 DIAGNOSIS — L89613 Pressure ulcer of right heel, stage 3: Secondary | ICD-10-CM | POA: Diagnosis not present

## 2018-01-04 DIAGNOSIS — I10 Essential (primary) hypertension: Secondary | ICD-10-CM | POA: Diagnosis not present

## 2018-01-04 NOTE — Progress Notes (Signed)
Subjective:   Brendan [name] was seen in consultation in the movement disorder clinic at the request of Dr. [name].  His PCP is [name], MD.  The evaluation is for possible DBS for essential tremor. The records that were made available to me were reviewed. This patient is accompanied in the office by his spouse who supplements the history.  Tremor started approximately 25 years ago and involves the bilateral hands and head.   It started in the L hand but about 5 years ago he noted some tremor in the right hand.   Wife thinks that he has had head tremor for 25 years.  He is L hand dominant.   Tremor is most noticeable when he tries to eat.  Wife does note some rest tremor.   There is a family hx of tremor in paternal uncle, now deceased.    Affected by caffeine: doesn't know (doesn't drink caffeine) Affected by alcohol:  Rarely drinks so doesn't know Affected by stress:  Yes.  , per wife Affected by fatigue:  Yes.   Spills soup if on spoon:  Yes.   Spills glass of liquid if full:  Yes.   Affects ADL's (tying shoes, brushing teeth, etc):  Yes.   (does okay with teeth and shoes but trouble combing hair; shaves with electric)  Current/Previously tried tremor medications: propranolol (80 mg bid), primidone (250 mg bid), gabapentin (600 mg tid)  Current medications that may exacerbate tremor:  n/a  Outside reports reviewed: historical medical records, office notes and referral letter/letters. Reviewed patients most recent CT brain done in 2014 and there was atrophy but otherwise unremarkable.  Any other sx's: Voice: no change Sleep: trouble staying asleep (sleeps in recliner because the tremor wakes him up less)  Vivid Dreams:  No.  Acting out dreams:  No. Wet Pillows: No. Postural symptoms:  Yes.  , when first wakes up  Falls?  No. Bradykinesia symptoms: trouble getting out of chair or couch if low Loss of smell:  No. Loss of taste:  No. Urinary Incontinence:  No. Difficulty  Swallowing:  Yes.   Handwriting, micrographia: unknown - cannot write at all because of tremor Depression:  No. Memory changes:  No. Hallucinations:  No.  visual distortions: No. N/V:  No. Lightheaded:  No.  Syncope: No. Diplopia:  No. Dyskinesia:  No.  02/13/17 update: Patient presents today in follow-up for his essential tremor.  He is accompanied by his wife and daughter who supplements the history.  He had neurocognitive testing with Dr. [name] on October 26, 2016.  There is evidence of mild cognitive impairment but no evidence of dementia.  Saw Dr. [name] 01/31/17.    At that point in time, the patient was unsure if he wanted to proceed with DBS.  He was told by Dr. [name] that he would need cardiac clearance.  He did have an MRI of the brain, but the T2 images did not have proper spacing.    04/20/17 update: Patient is seen today for essential tremor for preop video and any last minute questions.  He is accompanied by his wife who supplements the history.  The patient is scheduled for fiducial placement on April 26, 2017.  He is scheduled for bilateral VIM DBS on May 04, 2017.  The patient is currently on primidone, 250 mg twice daily.  He is also on gabapentin, 600 mg 3 times per day.  06/18/17 update: Patient is seen today in follow-up for essential tremor.  He  is accompanied by his daughter who supplements the history.  Much has happened since our last visit.  Records are reviewed.  The patient was admitted to the hospital on May 04, 2017 for DBS surgery.  Postoperative images that same day indicated that the patient had pneumocephalus, but no blood was present at the time.  Postoperatively, the patient experienced confusion and had a CT of the brain demonstrating a small parenchymal hemorrhage along the left DBS lead.  The patient experienced right hemiparesis and confusion and a dysphasia.  He had another MRI of the brain on May 17, 2017 demonstrating a probable postoperative  hematoma.  The patient went to subacute nursing facility for rehab, but quickly bounced back to the hospital and subsequently ended up in inpatient rehab.  When he was admitted to inpatient rehab, strength was 0/5, he was lethargic and had significant difficulties with speech.  He was admitted to rehab from May 18, 2017 to June 09, 2017.  He made marked recovery and progress while in rehab.  When discharged, strength was 4/5 in the right upper and lower extremities and he was transferring with and without a rolling walker with minimal assist.  Overall, the patient and daughter are pleased with his progress.  He is doing very well at home and rarely uses the walker within the house.  But is not confused during the day.  He has some word finding troubles. daughter states that wakes up in the middle of the night confused.  On seroquel for sleep.  Having trouble reading - vision too blurry to read.    11/19/17 update: Patient is seen today in follow-up for essential tremor and for turning on his DBS.   Pt is accompanied by his grandson and wife who supplement the history.   He is still on primidone, 50 mg twice per day. Also on trazodone 50 mg q hs for sleep but still has some sleep trouble.  Much has happened since our last visit.  Numerous records have been reviewed.  Patient was in the hospital in April with Pseudomonas epididymitis.  He has also been treated for multiple urinary tract infections since last visit and has also been treated by pulmonary for cough.  He was finally able to have his battery placed on November 13, 2017.  Dr. Fredrich BirksStern's nurse contacted me this morning and stated that the patient had a few falls on Friday and some confusion.  He had trouble manipulating the walker.  However, this resolved over the weekend.  When asked about this today, confusion is denied by pt/wife but stated that he had episode of "jerking" and that caused him to let go of/throw the walker and he fell because of that.   Feeling good. Wife states that he is not 100% physically back to where he was before surgery but mentally he is.  Has some word finding trouble but had that issue prior to sx per wife.  11/26/17 update: Patient is seen today for follow-up essential tremor, status post activation of DBS device 1 week ago.  Patient is seen accompanied by his wife who supplements the history.  He remains on primidone, 50 mg twice per day.  He reports that tremor has been improved over the last week but still present.  Wife is not sure that the stimulator is really helping, but the patient states it is some.  Tremor seems to come and go.  No falls since last visit.  Saw Dr. Venetia MaxonStern and he will take staples  out this Friday.  Wife asks me to show her how to use patient programmer again.  She also asks me to look at his left foot as he felt quite some time ago (prior to last visit) and has a wound on the foot.  01/07/18 update: Patient is seen today in follow-up for essential tremor.  Patient remains on primidone, 50 mg twice per day.  Reports that tremor has been good until a recent fall and noted a little more tremor since then.  Now on robaxin by PMR.  Still on gabapentin.   Fell last Monday.  Was coming out of physician office and fell off curb outside.  No other falls.  Allergies  Allergen Reactions  . Lisinopril Cough    Outpatient Encounter Medications as of 01/07/2018  Medication Sig  . ALLERGY 10 MG tablet TAKE 1 TABLET BY MOUTH DAILY  . aspirin EC 81 MG tablet Take 81 mg by mouth daily.  [name] atorvastatin (LIPITOR) 40 MG tablet Take 1 tablet (40 mg total) by mouth daily.  . furosemide (LASIX) 20 MG tablet TAKE 1 TABLET BY MOUTH DAILY, TAKE AN EXTRA DOSE IF NEEDED  . gabapentin (NEURONTIN) 600 MG tablet TAKE 1 TABLET BY MOUTH 3 TIMES DAILY (Patient taking differently: Take 300 mg by mouth 2 (two) times daily. )  . ketoconazole (NIZORAL) 2 % shampoo Apply 1 application topically 2 (two) times a week. (Patient taking  differently: Apply 1 application topically once a week. )  . losartan (COZAAR) 100 MG tablet TAKE 1 TABLET BY MOUTH DAILY  . losartan (COZAAR) 50 MG tablet Take 1 tablet (50 mg total) by mouth daily.  . Melatonin 3 MG TABS Take 1 tablet (3 mg total) by mouth at bedtime.  . methocarbamol (ROBAXIN) 500 MG tablet TAKE 1 TABLET BY MOUTH TWICE DAILY AS NEEDED FOR MUSCLE SPASMS  . omeprazole (PRILOSEC) 20 MG capsule Take 1 capsule (20 mg total) by mouth daily as needed (acid reflux).  . primidone (MYSOLINE) 50 MG tablet Take 1 tablet (50 mg total) by mouth 2 (two) times daily.  [name] PROAIR HFA 108 (90 Base) MCG/ACT inhaler Take 2 puffs by mouth every 6 (six) hours as needed for wheezing or shortness of breath.   . propranolol (INDERAL) 40 MG tablet Take 1 tablet (40 mg total) by mouth 2 (two) times daily.  [name] Propylene Glycol (SYSTANE BALANCE) 0.6 % SOLN Place 1 drop into both eyes daily as needed (for dry eyes).  . tamsulosin (FLOMAX) 0.4 MG CAPS capsule Take 1 capsule (0.4 mg total) by mouth daily after supper.  . traMADol (ULTRAM) 50 MG tablet Take by mouth every 6 (six) hours as needed.  . traZODone (DESYREL) 50 MG tablet Take 1 tablet (50 mg total) by mouth at bedtime.  . [DISCONTINUED] Adhesive Tape TAPE 1 each by Does not apply route daily.  . nitroGLYCERIN (NITROSTAT) 0.4 MG SL tablet Place 1 tablet (0.4 mg total) under the tongue every 5 (five) minutes as needed for chest pain. call 911 if chest pain not better (Patient not taking: Reported on 01/07/2018)  . [DISCONTINUED] doxycycline (VIBRA-TABS) 100 MG tablet Take 1 tablet (100 mg total) by mouth 2 (two) times daily.  . [DISCONTINUED] Gauze Pads & Dressings (GAUZE BANDAGE 3") MISC 1 application by Does not apply route daily.  . [DISCONTINUED] HYDROcodone-acetaminophen (NORCO/VICODIN) 5-325 MG tablet Take 1 tablet by mouth every 4 (four) hours as needed for severe pain ((score 7 to 10)).  . [DISCONTINUED] neomycin-bacitracin-polymyxin (NEOSPORIN)  ointment Apply 1 application topically every 12 (twelve) hours.   Facility-Administered Encounter Medications as of 01/07/2018  Medication  . 0.9 %  sodium chloride infusion  . sodium chloride 0.9 % injection 3 mL    Past Medical History:  Diagnosis Date  . Anxiety disorder   . Benign enlargement of prostate   . CAD in native artery 1997   Referred for CABG x3 in 2004 for LAD diagonal bifurcation lesion  . Cervical spinal stenosis   . Complex regional pain syndrome of right upper extremity    Right wrist; L arm  . Coronary atherosclerosis of artery bypass graft June 2013   Occluded SVG-D1; Cardiologist Dr. Herbie BaltimoreHarding  . Gait disorder   . Gastroesophageal reflux disease   . Glaucoma   . Hiatal hernia    GI: Dr Evette CristalGanem  . Hyperlipidemia LDL goal <70   . Hypertension   . Memory loss    Mild  . Obesity   . Peripheral neuropathy    possible peripheral neuropathy  . S/P CABG x 3 2004    LIMA-LAD, SVG to diagonal, SVG to OM  . Tremor, essential    On Primidone  . Unstable angina pectoris [facility])  June 2013   Cardiac cath: occluded SVG-DI. Patent LIMA-LAD and SVG-OM. EF 45% with apical inferior HK.    Past Surgical History:  Procedure Laterality Date  . CARDIAC CATHETERIZATION    . CATARACT EXTRACTION, BILATERAL    . CORONARY ARTERY BYPASS GRAFT  06/2002   LIMA-LAD, SVG-D1, SVG-OM  . EYE SURGERY    . KNEE SURGERY Left   . LEFT HEART CATHETERIZATION WITH CORONARY/GRAFT ANGIOGRAM N/A 10/13/2011   Procedure: LEFT HEART CATHETERIZATION WITH Isabel CapriceORONARY/GRAFT ANGIOGRAM;  Surgeon: Marykay Lexavid W Harding, MD;  [facility];  Service: Cardiovascular;  Laterality: N/A;  . MINOR PLACEMENT OF FIDUCIAL N/A 04/26/2017   Procedure: Fiducial placement;  Surgeon: Maeola HarmanStern, Joseph, MD;  [facility];  Service: Neurosurgery;  Laterality: N/A;  Fiducial placement  . NM MYOCAR PERF WALL MOTION  03/23/2009   protocol:Bruce, normal perfusion in all regions, post-stress EF 72%, exercise capacity 7METS, EKG  negative for ischemia.  [name]. PULSE GENERATOR IMPLANT Bilateral 11/13/2017   Procedure: Bilateral Implantable pulse generator;  Surgeon: Maeola HarmanStern, Joseph, MD;  [facility];  Service: Neurosurgery;  Laterality: Bilateral;  Bilateral Implantable pulse generator  . Shoulder Orthoscopic Surgery   06/2003  . SUBTHALAMIC STIMULATOR INSERTION Bilateral 05/04/2017   Procedure: Bilateral Deep brain stimulator placement;  Surgeon: Maeola HarmanStern, Joseph, MD;  [facility];  Service: Neurosurgery;  Laterality: Bilateral;  Bilateral deep brain stimulator placement  . TRANSTHORACIC ECHOCARDIOGRAM  04/2017   Jan 2019: Normal LV size and function.  EF 55-60%.  No or W MA.  Mild LA dilation.  . wrist ganglion cyst Left     Social History   Socioeconomic History  . Marital status: Married    Spouse name: [name]  . Number of children: 2  . Years of education: 10  . Highest education level: Not on file  Occupational History  . 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 Financial resource strain: Not hard at all  . Food insecurity:    Worry: Never true    Inability: Never true  . Transportation needs:    Medical: No    Non-medical: No  Tobacco Use  . Smoking status: Former Smoker    Packs/day: 3.00    Years: 20.00  Pack years: 60.00    Types: Cigarettes    Last attempt to quit: 04/17/1968    Years since quitting: 49.7  . Smokeless tobacco: Never Used  Substance and Sexual Activity  . Alcohol use: No    Frequency: Never  . Drug use: No  . Sexual activity: Never  Lifestyle  . Physical activity:    Days per week: 0 days    Minutes per session: 0 min  . Stress: Only a little  Relationships  . Social connections:    Talks on phone: More than three times a week    Gets together: Three times a week    Attends religious service: Patient refused    Active member of club or organization: Patient refused    Attends meetings of clubs or organizations:  Patient refused    Relationship status: Married  . Intimate partner violence:    Fear of current or ex partner: No    Emotionally abused: No    Physically abused: No    Forced sexual activity: No  Other Topics Concern  . Not on file  Social History Narrative   Married.  Wife is Royal Piedra.   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   Former smoker, quit 20 years ago. Does not drink alcohol.   Does not get routine exercise, walks sometimes with a cane.   Caffeine use: diet-soda   Left handed     Family Status  Relation Name Status  . Mother  Deceased at age 19       CHF  . Father  Deceased at age 76       following a fall  . Sister  Alive  . Brother  Alive  . [employer]  Alive  . Brother  Alive       pt. has four brothers  . Sister  Alive  . Daughter  Alive  . Daughter  Alive  . Brother  Alive  . Brother  Alive    Review of Systems Review of Systems  Constitutional: Negative.   HENT: Negative.   Eyes: Negative.   Respiratory: Negative.   Cardiovascular: Negative.   Gastrointestinal: Negative.   Genitourinary: Negative.   Musculoskeletal: Negative.   Skin: Negative.       Objective:   VITALS:   Vitals:   01/07/18 1438  BP: 130/80  Pulse: (!) 58  SpO2: 95%  Weight: 211 lb (95.7 kg)  Height: 5\' 6"  (1.676 m)   GEN:  The patient appears stated age and is in NAD. HEENT:  Normocephalic, atraumatic.  The mucous membranes are moist. The superficial temporal arteries are without ropiness or tenderness. CV:  Bradycardic.  regular Lungs:  CTAB Neck/HEME:  There are no carotid bruits bilaterally. Skin: he has swelling of the L hand and bruising most significantly over the L pinky finger, PIP joint  Neurological examination:  Orientation: The patient is alert and oriented x3. Cranial nerves: There is good facial symmetry. The speech is fluent and clear. Soft palate rises symmetrically and there is no tongue deviation. Hearing is intact to  conversational tone. Sensation: Sensation is intact to light touch throughout Motor: Strength is 5/5 in the bilateral upper and lower extremities.  Movement examination: Tone: There is normal tone in the UE/LE Abnormal movements: Prior programming today, there was almost imperceptible tremor of the left hand, but the patient does state that it can get worse. Coordination:  There is no decremation with RAM's, with any form of RAMS, including  alternating supination and pronation of the forearm, hand opening and closing, finger taps, heel taps and toe taps. Gait and Station: The patient pushes off of the chair to arise.  He uses the walker and is steady with that but unsteady without it    labs: Lab Results  Component Value Date   HGBA1C 5.8 (H) 05/16/2017     Chemistry      Component Value Date/Time   NA 138 11/05/2017 1137   NA 138 08/29/2017 1547   K 4.5 11/05/2017 1137   CL 102 11/05/2017 1137   CO2 26 11/05/2017 1137   BUN 12 11/05/2017 1137   BUN 12 08/29/2017 1547   CREATININE 1.20 11/05/2017 1137   CREATININE 1.02 05/01/2016 1118      Component Value Date/Time   CALCIUM 9.3 11/05/2017 1137   ALKPHOS 82 07/16/2017 1528   AST 17 07/16/2017 1528   ALT 17 07/16/2017 1528   BILITOT 1.2 07/16/2017 1528     DBS programming was performed today which is described in more detail on a separate programming procedure notes.     Assessment/Plan:   1.  Essential Tremor.  -Patient is status post bilateral DBS to the bilateral VIM on May 04, 2017.  He suffered a postoperative bleed on the left, with resultant hematoma causing initial paralysis of the right hemi-soma and then mild paresis on the right.  Because of these complications, along with multiple other infections, the patient's battery was not placed until November 13, 2017.    -Decrease primidone to 50 mg once per day for a week and then discontinue the medication.  2.  Possible myoclonus  -Decrease gabapentin to 300 mg once  per day.  Myoclonus had already gone away after we initially decreased the dose, but family would like to try and get him off of this if possible.  3.  Wound, left foot  -He is in wound care weekly.[name]  Use walker all the time.  4. L hand trauma  -after fall.  Will do x-ray today  5.  F/u 5-6 months   CC:  [name], MD

## 2018-01-07 ENCOUNTER — Ambulatory Visit
Admission: RE | Admit: 2018-01-07 | Discharge: 2018-01-07 | Disposition: A | Discharge status: Home / Self Care | Payer: PPO | Source: Ambulatory Visit | Attending: Neurology | Admitting: Neurology

## 2018-01-07 ENCOUNTER — Encounter: Payer: Self-pay | Admitting: Neurology

## 2018-01-07 ENCOUNTER — Ambulatory Visit (INDEPENDENT_AMBULATORY_CARE_PROVIDER_SITE_OTHER): Payer: PPO | Admitting: Neurology

## 2018-01-07 VITALS — BP 130/80 | HR 58 | Ht 66.0 in | Wt 211.0 lb

## 2018-01-07 DIAGNOSIS — I10 Essential (primary) hypertension: Secondary | ICD-10-CM | POA: Diagnosis not present

## 2018-01-07 DIAGNOSIS — S6992XA Unspecified injury of left wrist, hand and finger(s), initial encounter: Secondary | ICD-10-CM

## 2018-01-07 DIAGNOSIS — Z9689 Presence of other specified functional implants: Secondary | ICD-10-CM | POA: Diagnosis not present

## 2018-01-07 DIAGNOSIS — G253 Myoclonus: Secondary | ICD-10-CM | POA: Diagnosis not present

## 2018-01-07 DIAGNOSIS — G250 Essential tremor: Secondary | ICD-10-CM

## 2018-01-07 DIAGNOSIS — S60222A Contusion of left hand, initial encounter: Secondary | ICD-10-CM | POA: Diagnosis not present

## 2018-01-07 DIAGNOSIS — L89613 Pressure ulcer of right heel, stage 3: Secondary | ICD-10-CM | POA: Diagnosis not present

## 2018-01-07 DIAGNOSIS — I25810 Atherosclerosis of coronary artery bypass graft(s) without angina pectoris: Secondary | ICD-10-CM | POA: Diagnosis not present

## 2018-01-07 NOTE — Patient Instructions (Addendum)
1.  Decrease primidone - 50 mg - 1 tablet in the AM for a week and then stop the medication 2.  In one week (once you are off of the primidone) - decrease gabapentin to 300 mg daily.  We can decide in the future if we should stop that all together

## 2018-01-07 NOTE — Procedures (Signed)
DBS Programming was performed.    Type of device:  [employer]  Total time spent programming was 12 minutes.  Device was turned on.  Soft start was confirmed to be on.  Impedences were checked and were within normal limits.  Battery was checked and was determined to be functioning normally and not near the end of life.  Final settings were as follows:  Left brain electrode:     1-0+           ; Amplitude  2.2  V   ; Pulse width 90 microseconds;   Frequency   160   Hz.  Right brain electrode:     1-0+          ; Amplitude   2.6  V ;  Pulse width 90  microseconds;  Frequency   160    Hz.

## 2018-01-08 ENCOUNTER — Telehealth: Payer: Self-pay | Admitting: Neurology

## 2018-01-08 NOTE — Telephone Encounter (Signed)
-----   Message from Octaviano Battyebecca S Tat, DO sent at 01/08/2018  7:21 AM EDT ----- Let pt/wife know that hand xray was okay

## 2018-01-08 NOTE — Progress Notes (Signed)
Brendan [name], Loy W. ([id number]) Visit Report for 01/04/2018 Arrival Information Details Patient Name: Brendan [name], Brendan W. Date of Service: 01/04/2018 12:30 PM Medical Record Number: [id number] Patient Account Number: [id number] Date of Birth/Sex: 09/30/1940 (77 y.o. M) Treating RN: Rema JasmineNg, Wendi Primary Care Merritt Kibby: Freddrick MarchAMIN, [name] Other Clinician: Referring Quavion Boule: Freddrick MarchAMIN, [name] Treating Darriona Dehaas/Extender: Linwood DibblesSTONE III, HOYT Weeks in Treatment: 1 Visit Information History Since Last Visit Added or deleted any medications: No Patient Arrived: Walker Any new allergies or adverse reactions: No Arrival Time: 12:31 Had a fall or experienced change in No Accompanied By: spouse activities of daily living that may affect Transfer Assistance: None risk of falls: Patient Identification Verified: Yes Signs or symptoms of abuse/neglect since last visito No Secondary Verification Process Completed: Yes Hospitalized since last visit: No Implantable device outside of the clinic excluding No cellular tissue based products placed in the center since last visit: Has Dressing in Place as Prescribed: Yes Pain Present Now: Yes Electronic Signature(s) Signed: 01/04/2018 4:01:10 PM By: Rema JasmineNg, Wendi Entered By: Rema JasmineNg, Wendi on 01/04/2018 12:35:19 Brendan [name], Brendan W. ([id number]) -------------------------------------------------------------------------------- Encounter Discharge Information Details Patient Name: Brendan [name], Brendan W. Date of Service: 01/04/2018 12:30 PM Medical Record Number: [id number] Patient Account Number: [id number] Date of Birth/Sex: 06/25/1940 (77 y.o. M) Treating RN: Huel CoventryWoody, Kim Primary Care Georgann Bramble: Freddrick MarchAMIN, [name] Other Clinician: Referring Princeston Blizzard: Freddrick MarchAMIN, [name] Treating Bostyn Bogie/Extender: Linwood DibblesSTONE III, HOYT Weeks in Treatment: 1 Encounter Discharge Information Items Discharge Condition: Stable Ambulatory Status: Walker Discharge Destination: Home Transportation: Private Auto Accompanied By:  self Schedule Follow-up Appointment: Yes Clinical Summary of Care: Post Procedure Vitals: Temperature (F): 97.7 Pulse (bpm): 65 Respiratory Rate (breaths/min): 18 Blood Pressure (mmHg): 135/56 Electronic Signature(s) Signed: 01/04/2018 5:43:30 PM By: Elliot GurneyWoody, BSN, RN, CWS, Kim RN, BSN Entered By: Elliot GurneyWoody, BSN, RN, CWS, Kim on 01/04/2018 13:46:33 Brendan [name], Brendan W. ([id number]) -------------------------------------------------------------------------------- Lower Extremity Assessment Details Patient Name: Brendan [name], Numa W. Date of Service: 01/04/2018 12:30 PM Medical Record Number: [id number] Patient Account Number: [id number] Date of Birth/Sex: 03/23/1941 (77 y.o. M) Treating RN: Rema JasmineNg, Wendi Primary Care Keslyn Teater: Freddrick MarchAMIN, [name] Other Clinician: Referring Chavie Kolinski: Freddrick MarchAMIN, [name] Treating Advika Mclelland/Extender: Linwood DibblesSTONE III, HOYT Weeks in Treatment: 1 Edema Assessment Assessed: [Left: No] [Right: No] Edema: [Left: N] [Right: o] Calf Left: Right: Point of Measurement: 35 cm From Medial Instep cm 36.5 cm Ankle Left: Right: Point of Measurement: 12 cm From Medial Instep cm 24.5 cm Vascular Assessment Claudication: Claudication Assessment [Right:None] Pulses: Dorsalis Pedis Palpable: [Right:Yes] Posterior Tibial Extremity colors, hair growth, and conditions: Extremity Color: [Right:Normal] Hair Growth on Extremity: [Right:No] Temperature of Extremity: [Right:Warm] Capillary Refill: [Right:< 3 seconds] Toe Nail Assessment Left: Right: Thick: No Discolored: No Deformed: No Improper Length and Hygiene: No Electronic Signature(s) Signed: 01/04/2018 4:01:10 PM By: Rema JasmineNg, Wendi Entered By: Rema JasmineNg, Wendi on 01/04/2018 12:44:10 Brendan [name], Brendan W. ([id number]) -------------------------------------------------------------------------------- Multi Wound Chart Details Patient Name: Brendan [name], Brendan W. Date of Service: 01/04/2018 12:30 PM Medical Record Number: [id number] Patient Account Number:  [id number] Date of Birth/Sex: 09/11/1940 [age] y.o. M) Treating RN: Curtis Sitesorthy, Joanna Primary Care Blondine Hottel: Freddrick MarchAMIN, [name] Other Clinician: Referring Hellen Shanley: Freddrick MarchAMIN, [name] Treating Tamella Tuccillo/Extender: Linwood DibblesSTONE III, HOYT Weeks in Treatment: 1 Vital Signs Height(in): 66 Pulse(bpm): 65 Weight(lbs): 210 Blood Pressure(mmHg): 136/56 Body Mass Index(BMI): 34 Temperature(F): 97.7 Respiratory Rate 18 (breaths/min): Photos: [N/A:N/A] Wound Location: Right Calcaneus N/A N/A Wounding Event: Pressure Injury N/A N/A Primary Etiology: Pressure Ulcer N/A N/A Comorbid History: Anemia, Chronic Obstructive N/A N/A Pulmonary Disease (COPD), Coronary Artery Disease, Hypertension, Osteoarthritis, Neuropathy Date Acquired: 11/19/2017 N/A N/A Weeks of Treatment: 1  N/A N/A Wound Status: Open N/A N/A Measurements L x W x D 2x1.8x0.1 N/A N/A (cm) Area (cm) : 2.827 N/A N/A Volume (cm) : 0.283 N/A N/A % Reduction in Area: 28.90% N/A N/A % Reduction in Volume: 28.70% N/A N/A Classification: Category/Stage III N/A N/A Exudate Amount: Medium N/A N/A Exudate Type: Serous N/A N/A Exudate Color: amber N/A N/A Wound Margin: Flat and Intact N/A N/A Granulation Amount: None Present (0%) N/A N/A Necrotic Amount: Medium (34-66%) N/A N/A Necrotic Tissue: Eschar, Adherent Slough N/A N/A Exposed Structures: Fat Layer (Subcutaneous N/A N/A Tissue) Exposed: Yes Fascia: No Tendon: No Brendan [name], FELDNER. ([id number]) Muscle: No Joint: No Bone: No Epithelialization: None N/A N/A Periwound Skin Texture: Excoriation: No N/A N/A Induration: No Callus: No Crepitus: No Rash: No Scarring: No Periwound Skin Moisture: Maceration: No N/A N/A Dry/Scaly: No Periwound Skin Color: Atrophie Blanche: No N/A N/A Cyanosis: No Ecchymosis: No Erythema: No Hemosiderin Staining: No Mottled: No Pallor: No Rubor: No Temperature: No Abnormality N/A N/A Tenderness on Palpation: Yes N/A N/A Wound Preparation: Ulcer  Cleansing: N/A N/A Rinsed/Irrigated with Saline Topical Anesthetic Applied: Other: lidocaine 4% Treatment Notes Electronic Signature(s) Signed: 01/04/2018 5:18:43 PM By: Curtis Sites Entered By: Curtis Sites on 01/04/2018 13:32:38 Brendan [name] ([id number]) -------------------------------------------------------------------------------- Multi-Disciplinary Care Plan Details Patient Name: Brendan [name] Date of Service: 01/04/2018 12:30 PM Medical Record Number: [id number] Patient Account Number: [id number] Date of Birth/Sex: 07-17-1940 (77 y.o. M) Treating RN: Curtis Sites Primary Care Sione Baumgarten: [name] Other Clinician: Referring Teyonna Plaisted: [name] Treating Saveon Plant/Extender: Linwood Dibbles, HOYT Weeks in Treatment: 1 Active Inactive ` Orientation to the Wound Care Program Nursing Diagnoses: Knowledge deficit related to the wound healing center program Goals: Patient/caregiver will verbalize understanding of the Wound Healing Center Program Date Initiated: 12/24/2017 Target Resolution Date: 01/14/2018 Goal Status: Active Interventions: Provide education on orientation to the wound center Notes: ` Wound/Skin Impairment Nursing Diagnoses: Impaired tissue integrity Goals: Patient/caregiver will verbalize understanding of skin care regimen Date Initiated: 12/24/2017 Target Resolution Date: 01/14/2018 Goal Status: Active Ulcer/skin breakdown will have a volume reduction of 30% by week 4 Date Initiated: 12/24/2017 Target Resolution Date: 01/14/2018 Goal Status: Active Interventions: Assess patient/caregiver ability to obtain necessary supplies Assess patient/caregiver ability to perform ulcer/skin care regimen upon admission and as needed Assess ulceration(s) every visit Treatment Activities: Skin care regimen initiated : 12/24/2017 Notes: Electronic Signature(s) Signed: 01/04/2018 5:18:43 PM By: Rolm Bookbinder ([id number]) Entered By: Curtis Sites  on 01/04/2018 13:32:31 Brendan [name] ([id number]) -------------------------------------------------------------------------------- Pain Assessment Details Patient Name: Brendan [name] Date of Service: 01/04/2018 12:30 PM Medical Record Number: [id number] Patient Account Number: [id number] Date of Birth/Sex: 06-26-1940 (77 y.o. M) Treating RN: Rema Jasmine Primary Care Geoffery Aultman: [name] Other Clinician: Referring Giulio Bertino: [name] Treating Safal Halderman/Extender: Linwood Dibbles, HOYT Weeks in Treatment: 1 Active Problems Location of Pain Severity and Description of Pain Patient Has Paino Yes Site Locations Rate the pain. Current Pain Level: 6 Worst Pain Level: 10 Least Pain Level: 2 Character of Pain Describe the Pain: Aching Pain Management and Medication Current Pain Management: Goals for Pain Management pt stated fell this Monday while at doctors office. enc. to see primary PRN for pain management. Electronic Signature(s) Signed: 01/04/2018 4:01:10 PM By: Rema Jasmine Entered By: Rema Jasmine on 01/04/2018 12:36:56 Brendan [name] ([id number]) -------------------------------------------------------------------------------- Patient/Caregiver Education Details Patient Name: Brendan [name] Date of Service: 01/04/2018 12:30 PM Medical Record Number: [id number] Patient Account Number: [id number] Date of Birth/Gender: Mar 28, 1941 (77 y.o. M)  Treating RN: Huel Coventry Primary Care Physician: [name] Other Clinician: Referring Physician: [name] Treating Physician/Extender: Skeet Simmer in Treatment: 1 Education Assessment Education Provided To: Patient Education Topics Provided Wound/Skin Impairment: Handouts: Caring for Your Ulcer, Other: continue wound care as prescribed Methods: Demonstration Responses: State content correctly Electronic Signature(s) Signed: 01/04/2018 5:43:30 PM By: Elliot Gurney, BSN, RN, CWS, Kim RN, BSN Entered By: Elliot Gurney, BSN, RN, CWS, Kim  on 01/04/2018 13:47:00 Brendan [name]) -------------------------------------------------------------------------------- Wound Assessment Details Patient Name: Brendan [name] Date of Service: 01/04/2018 12:30 PM Medical Record Number: [id number] Patient Account Number: [id number] Date of Birth/Sex: [birth year] (77 y.o. M) Treating RN: Rema Jasmine Primary Care Magnolia Mattila: [name] Other Clinician: Referring Jesua Tamblyn: [name] Treating Sanora Cunanan/Extender: Linwood Dibbles, HOYT Weeks in Treatment: 1 Wound Status Wound Number: 1 Primary Pressure Ulcer Etiology: Wound Location: Right Calcaneus Wound Open Wounding Event: Pressure Injury Status: Date Acquired: 11/19/2017 Comorbid Anemia, Chronic Obstructive Pulmonary Weeks Of Treatment: 1 History: Disease (COPD), Coronary Artery Disease, Clustered Wound: No Hypertension, Osteoarthritis, Neuropathy Photos Photo Uploaded By: Rema Jasmine on 01/04/2018 12:48:08 Wound Measurements Length: (cm) 2 Width: (cm) 1.8 Depth: (cm) 0.1 Area: (cm) 2.827 Volume: (cm) 0.283 % Reduction in Area: 28.9% % Reduction in Volume: 28.7% Epithelialization: None Tunneling: No Undermining: No Wound Description Classification: Category/Stage III Wound Margin: Flat and Intact Exudate Amount: Medium Exudate Type: Serous Exudate Color: amber Foul Odor After Cleansing: No Slough/Fibrino Yes Wound Bed Granulation Amount: None Present (0%) Exposed Structure Necrotic Amount: Medium (34-66%) Fascia Exposed: No Necrotic Quality: Eschar, Adherent Slough Fat Layer (Subcutaneous Tissue) Exposed: Yes Tendon Exposed: No Muscle Exposed: No Joint Exposed: No Bone Exposed: No Periwound Skin Texture AQUILLA, VOILES. ([id number]) Texture Color No Abnormalities Noted: No No Abnormalities Noted: No Callus: No Atrophie Blanche: No Crepitus: No Cyanosis: No Excoriation: No Ecchymosis: No Induration: No Erythema: No Rash: No Hemosiderin  Staining: No Scarring: No Mottled: No Pallor: No Moisture Rubor: No No Abnormalities Noted: No Dry / Scaly: No Temperature / Pain Maceration: No Temperature: No Abnormality Tenderness on Palpation: Yes Wound Preparation Ulcer Cleansing: Rinsed/Irrigated with Saline Topical Anesthetic Applied: Other: lidocaine 4%, Treatment Notes Wound #1 (Right Calcaneus) 1. Cleansed with: Clean wound with Normal Saline 4. Dressing Applied: Iodoflex 5. Secondary Dressing Applied Bordered Foam Dressing Notes netting Electronic Signature(s) Signed: 01/04/2018 4:01:10 PM By: Rema Jasmine Entered By: Rema Jasmine on 01/04/2018 12:42:40 Brendan [name] ([id number]) -------------------------------------------------------------------------------- Vitals Details Patient Name: Brendan [name] Date of Service: 01/04/2018 12:30 PM Medical Record Number: [id number] Patient Account Number: [id number] Date of Birth/Sex: December 06, 1940 (77 y.o. M) Treating RN: Rema Jasmine Primary Care Maylene Crocker: [name] Other Clinician: Referring Jewelz Ricklefs: [name] Treating Tyse Auriemma/Extender: Linwood Dibbles, HOYT Weeks in Treatment: 1 Vital Signs Time Taken: 12:30 Temperature (F): 97.7 Height (in): 66 Pulse (bpm): 65 Weight (lbs): 210 Respiratory Rate (breaths/min): 18 Body Mass Index (BMI): 33.9 Blood Pressure (mmHg): 136/56 Reference Range: 80 - 120 mg / dl Electronic Signature(s) Signed: 01/04/2018 4:01:10 PM By: Rema Jasmine Entered By: Rema Jasmine on 01/04/2018 12:38:00

## 2018-01-08 NOTE — Progress Notes (Signed)
Brendan [name], Brendan [name] ([id number]) Visit Report for 01/04/2018 Chief Complaint Document Details Patient Name: Brendan, [name] Date of Service: 01/04/2018 12:30 PM Medical Record Number: [id number] Patient Account Number: [id number] Date of Birth/Sex: [birth year] (77 y.o. M) Treating RN: Curtis Sites Primary Care Provider: Freddrick Brendan [name] Other Clinician: Referring Provider: Freddrick Brendan [name] Treating Provider/Extender: Linwood Dibbles, HOYT Weeks in Treatment: 1 Information Obtained from: Patient Chief Complaint Right heel ulcer Electronic Signature(s) Signed: 01/07/2018 8:08:51 AM By: Lenda Kelp PA-C Entered By: Lenda Kelp on 01/04/2018 12:51:21 Brendan [name] ([id number]) -------------------------------------------------------------------------------- Debridement Details Patient Name: Brendan [name] Date of Service: 01/04/2018 12:30 PM Medical Record Number: [id number] Patient Account Number: [id number] Date of Birth/Sex: 03/24/1941 (77 y.o. M) Treating RN: Curtis Sites Primary Care Provider: Freddrick Brendan [name] Other Clinician: Referring Provider: Freddrick Brendan [name] Treating Provider/Extender: Linwood Dibbles, HOYT Weeks in Treatment: 1 Debridement Performed for Wound #1 Right Calcaneus Assessment: Performed By: Physician STONE III, HOYT E., PA-C Debridement Type: Debridement Level of Consciousness (Pre- Awake and Alert procedure): Pre-procedure Verification/Time Yes - 13:33 Out Taken: Start Time: 13:33 Pain Control: Lidocaine 4% Topical Solution Total Area Debrided (L x W): 2 (cm) x 1.8 (cm) = 3.6 (cm) Tissue and other material Viable, Non-Viable, Slough, Subcutaneous, Skin: Dermis , Skin: Epidermis, Slough debrided: Level: Skin/Subcutaneous Tissue Debridement Description: Excisional Instrument: Curette Bleeding: Moderate Hemostasis Achieved: Pressure End Time: 13:37 Procedural Pain: 0 Post Procedural Pain: 0 Response to Treatment: Procedure was tolerated well Level of  Consciousness Awake and Alert (Post-procedure): Post Debridement Measurements of Total Wound Length: (cm) 2 Stage: Category/Stage III Width: (cm) 1.8 Depth: (cm) 0.3 Volume: (cm) 0.848 Character of Wound/Ulcer Post Improved Debridement: Post Procedure Diagnosis Same as Pre-procedure Electronic Signature(s) Signed: 01/04/2018 5:18:43 PM By: Curtis Sites Signed: 01/07/2018 8:08:51 AM By: Lenda Kelp PA-C Entered By: Curtis Sites on 01/04/2018 13:36:29 Brendan [name] ([id number]) -------------------------------------------------------------------------------- HPI Details Patient Name: Brendan [name] Date of Service: 01/04/2018 12:30 PM Medical Record Number: [id number] Patient Account Number: [id number] Date of Birth/Sex: 04/09/1941 (77 y.o. M) Treating RN: Curtis Sites Primary Care Provider: Freddrick Brendan [name] Other Clinician: Referring Provider: Freddrick Brendan [name] Treating Provider/Extender: Linwood Dibbles, HOYT Weeks in Treatment: 1 History of Present Illness HPI Description: 12/24/17 on evaluation today patient's is seen for initial evaluation concerning issues he's been having with his right heel for one month. According to the patient and his wife he sustained a fall which led to this injury. With that being said the appearance of the wound as well as the location of the wound seems to indicate more a pressure type injury in my pinion. Nonetheless whether pressure or otherwise abrasion the patient does have depth to subcutaneous tissue there is some Slough noted as well. He fortunately has no significant discomfort although he does have peripheral vascular disease as evidenced by the x-ray which was performed and showed no acute bone abnormality but there was purple vascular calcification noted. He has had a previous heart attack with bypass surgery subsequent and has a current ejection fraction of 42 for the 5% on the last check. He does have a history of hypertension as well as  COPD as well. The patient states overall things seem to be doing a little bit better but nonetheless they did want to come in to be checked out. Unfortunately they could not get into [place] in a sufficient amount of time they live in [place] therefore they are coming to [place] to see me here. I do believe we may want to send  for vascular testing in that case they would like to go to [facility] for that. His ABI's performed here in the office were noncompressible at 1.54 left in 1.46 right. No fevers, chills, nausea, or vomiting noted at this time. Patient does have a brain stimulator due to trimmers that he was experiencing. According to the patient's wife this is MRI compatible according to their physician. They just have to turn off the stimulation. 01/04/18 on evaluation today patient is seen for reevaluation concerning his right heel ulcer. He has had the arterial studies with ABI and TBI performed and this was very good. The right ABI was 1.55 with a TBI of 1.03 the left ABI was 1.28 with a TBI of 0.95. The patient's wound does have some necrotic tissue still overlying the surface of the wound. Fortunately there does not appear to be any evidence of infection at this time. Electronic Signature(s) Signed: 01/07/2018 8:08:51 AM By: Lenda Kelp PA-C Entered By: Lenda Kelp on 01/04/2018 13:48:56 Brendan [name] ([id number]) -------------------------------------------------------------------------------- Physical Exam Details Patient Name: Brendan [name] Date of Service: 01/04/2018 12:30 PM Medical Record Number: [id number] Patient Account Number: [id number] Date of Birth/Sex: 1940/05/22 (77 y.o. M) Treating RN: Curtis Sites Primary Care Provider: Freddrick Brendan [name] Other Clinician: Referring Provider: Freddrick Brendan [name] Treating Provider/Extender: STONE III, HOYT Weeks in Treatment: 1 Constitutional Well-nourished and well-hydrated in no acute distress. Respiratory normal  breathing without difficulty. Psychiatric this patient is able to make decisions and demonstrates good insight into disease process. Alert and Oriented x 3. pleasant and cooperative. Notes Patient's wound bed currently did require sharp debridement post debridement he actually had a much better surface to the wound. In fact this appears to be rather healthy I still want to continue with the Iodoflex to prevent any future biofilm/slough buildup. They are in agreement with the plan. Otherwise being that he does have such good blood flow I'm hoping that things will heal quite nicely. Electronic Signature(s) Signed: 01/07/2018 8:08:51 AM By: Lenda Kelp PA-C Entered By: Lenda Kelp on 01/04/2018 13:49:27 Brendan [name] ([id number]) -------------------------------------------------------------------------------- Physician Orders Details Patient Name: Brendan [name] Date of Service: 01/04/2018 12:30 PM Medical Record Number: [id number] Patient Account Number: [id number] Date of Birth/Sex: 1940/07/06 (77 y.o. M) Treating RN: Curtis Sites Primary Care Provider: Freddrick Brendan [name] Other Clinician: Referring Provider: Freddrick Brendan [name] Treating Provider/Extender: Linwood Dibbles, HOYT Weeks in Treatment: 1 Verbal / Phone Orders: No Diagnosis Coding ICD-10 Coding Code Description L89.613 Pressure ulcer of right heel, stage 3 I10 Essential (primary) hypertension I25.810 Atherosclerosis of coronary artery bypass graft(s) without angina pectoris Z96.89 Presence of other specified functional implants Wound Cleansing Wound #1 Right Calcaneus o Clean wound with Normal Saline. Anesthetic (add to Medication List) o Topical Lidocaine 4% cream applied to wound bed prior to debridement (In Clinic Only). Skin Barriers/Peri-Wound Care Wound #1 Right Calcaneus o Skin Prep Primary Wound Dressing Wound #1 Right Calcaneus o Iodoflex Secondary Dressing Wound #1 Right Calcaneus o Boardered Foam  Dressing Dressing Change Frequency Wound #1 Right Calcaneus o Change dressing every other day. Follow-up Appointments Wound #1 Right Calcaneus o Return Appointment in 1 week. Electronic Signature(s) Signed: 01/04/2018 5:18:43 PM By: Curtis Sites Signed: 01/07/2018 8:08:51 AM By: Lenda Kelp PA-C Entered By: Curtis Sites on 01/04/2018 13:36:55 Brendan [name] ([id number]) -------------------------------------------------------------------------------- Problem List Details Patient Name: Brendan [name] Date of Service: 01/04/2018 12:30 PM Medical Record Number: [id number] Patient Account Number: [id number] Date of Birth/Sex: 04-12-1941 (77 y.o. M) Treating RN:  Curtis Sites Primary Care Provider: Freddrick Brendan [name] Other Clinician: Referring Provider: Freddrick Brendan [name] Treating Provider/Extender: Linwood Dibbles, HOYT Weeks in Treatment: 1 Active Problems ICD-10 Evaluated Encounter Code Description Active Date Today Diagnosis L89.613 Pressure ulcer of right heel, stage 3 12/24/2017 No Yes I10 Essential (primary) hypertension 12/24/2017 No Yes I25.810 Atherosclerosis of coronary artery bypass graft(s) without 12/24/2017 No Yes angina pectoris Z96.89 Presence of other specified functional implants 12/24/2017 No Yes Inactive Problems Resolved Problems Electronic Signature(s) Signed: 01/07/2018 8:08:51 AM By: Lenda Kelp PA-C Entered By: Lenda Kelp on 01/04/2018 12:51:16 Brendan [name] ([id number]) -------------------------------------------------------------------------------- Progress Note Details Patient Name: Brendan [name] Date of Service: 01/04/2018 12:30 PM Medical Record Number: [id number] Patient Account Number: [id number] Date of Birth/Sex: 10/09/1940 (77 y.o. M) Treating RN: Curtis Sites Primary Care Provider: Freddrick Brendan [name] Other Clinician: Referring Provider: Freddrick Brendan [name] Treating Provider/Extender: Linwood Dibbles, HOYT Weeks in Treatment: 1 Subjective Chief  Complaint Information obtained from Patient Right heel ulcer History of Present Illness (HPI) 12/24/17 on evaluation today patient's is seen for initial evaluation concerning issues he's been having with his right heel for one month. According to the patient and his wife he sustained a fall which led to this injury. With that being said the appearance of the wound as well as the location of the wound seems to indicate more a pressure type injury in my pinion. Nonetheless whether pressure or otherwise abrasion the patient does have depth to subcutaneous tissue there is some Slough noted as well. He fortunately has no significant discomfort although he does have peripheral vascular disease as evidenced by the x-ray which was performed and showed no acute bone abnormality but there was purple vascular calcification noted. He has had a previous heart attack with bypass surgery subsequent and has a current ejection fraction of 42 for the 5% on the last check. He does have a history of hypertension as well as COPD as well. The patient states overall things seem to be doing a little bit better but nonetheless they did want to come in to be checked out. Unfortunately they could not get into [place] in a sufficient amount of time they live in [place] therefore they are coming to [place] to see me here. I do believe we may want to send for vascular testing in that case they would like to go to [facility] for that. His ABI's performed here in the office were noncompressible at 1.54 left in 1.46 right. No fevers, chills, nausea, or vomiting noted at this time. Patient does have a brain stimulator due to trimmers that he was experiencing. According to the patient's wife this is MRI compatible according to their physician. They just have to turn off the stimulation. 01/04/18 on evaluation today patient is seen for reevaluation concerning his right heel ulcer. He has had the arterial studies with ABI  and TBI performed and this was very good. The right ABI was 1.55 with a TBI of 1.03 the left ABI was 1.28 with a TBI of 0.95. The patient's wound does have some necrotic tissue still overlying the surface of the wound. Fortunately there does not appear to be any evidence of infection at this time. Patient History Information obtained from Patient. Family History Heart Disease - Siblings, Hypertension - Siblings, No family history of Cancer, Diabetes, Hereditary Spherocytosis, Kidney Disease, Lung Disease, Seizures, Stroke, Thyroid Problems, Tuberculosis. Social History Never smoker, Marital Status - Married, Alcohol Use - Never, Drug Use - No History, Caffeine Use - Rarely. Medical  And Surgical History Notes Cardiovascular HLD Genitourinary self cath twice daily r/t retention Neurologic 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, 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 Review of Systems (ROS) Brendan [name], Brendan [name]. ([id number]) Constitutional Symptoms (General Health) Denies complaints or symptoms of Fever, Chills. Respiratory The patient has no complaints or symptoms. Cardiovascular The patient has no complaints or symptoms. Psychiatric The patient has no complaints or symptoms. Objective Constitutional Well-nourished and well-hydrated in no acute distress. Vitals Time Taken: 12:30 PM, Height: 66 in, Weight: 210 lbs, BMI: 33.9, Temperature: 97.7 F, Pulse: 65 bpm, Respiratory Rate: 18 breaths/min, Blood Pressure: 136/56 mmHg. Respiratory normal breathing without difficulty. Psychiatric this patient is able to make decisions and demonstrates good insight into disease process. Alert and Oriented x 3. pleasant and cooperative. General Notes: Patient's wound bed currently did require sharp debridement post debridement he actually had a much better surface to the wound. In fact this appears to be rather healthy I still want to continue with the Iodoflex to prevent any future biofilm/slough  buildup. They are in agreement with the plan. Otherwise being that he does have such good blood flow I'm hoping that things will heal quite nicely. Integumentary (Hair, Skin) Wound #1 status is Open. Original cause of wound was Pressure Injury. The wound is located on the Right Calcaneus. The wound measures 2cm length x 1.8cm width x 0.1cm depth; 2.827cm^2 area and 0.283cm^3 volume. There is Fat Layer (Subcutaneous Tissue) Exposed exposed. There is no tunneling or undermining noted. There is a medium amount of serous drainage noted. The wound margin is flat and intact. There is no granulation within the wound bed. There is a medium (34- 66%) amount of necrotic tissue within the wound bed including Eschar and Adherent Slough. The periwound skin appearance did not exhibit: Callus, Crepitus, Excoriation, Induration, Rash, Scarring, Dry/Scaly, Maceration, Atrophie Blanche, Cyanosis, Ecchymosis, Hemosiderin Staining, Mottled, Pallor, Rubor, Erythema. Periwound temperature was noted as No Abnormality. The periwound has tenderness on palpation. Assessment Active Problems ICD-10 Pressure ulcer of right heel, stage 3 Brendan [name], Brendan [name] ([id number]) Essential (primary) hypertension Atherosclerosis of coronary artery bypass graft(s) without angina pectoris Presence of other specified functional implants Procedures Wound #1 Pre-procedure diagnosis of Wound #1 is a Pressure Ulcer located on the Right Calcaneus . There was a Excisional Skin/Subcutaneous Tissue Debridement with a total area of 3.6 sq cm performed by STONE III, HOYT E., PA-C. With the following instrument(s): Curette to remove Viable and Non-Viable tissue/material. Material removed includes Subcutaneous Tissue, Slough, Skin: Dermis, and Skin: Epidermis after achieving pain control using Lidocaine 4% Topical Solution. No specimens were taken. A time out was conducted at 13:33, prior to the start of the procedure. A Moderate amount of  bleeding was controlled with Pressure. The procedure was tolerated well with a pain level of 0 throughout and a pain level of 0 following the procedure. Post Debridement Measurements: 2cm length x 1.8cm width x 0.3cm depth; 0.848cm^3 volume. Post debridement Stage noted as Category/Stage III. Character of Wound/Ulcer Post Debridement is improved. Post procedure Diagnosis Wound #1: Same as Pre-Procedure Plan Wound Cleansing: Wound #1 Right Calcaneus: Clean wound with Normal Saline. Anesthetic (add to Medication List): Topical Lidocaine 4% cream applied to wound bed prior to debridement (In Clinic Only). Skin Barriers/Peri-Wound Care: Wound #1 Right Calcaneus: Skin Prep Primary Wound Dressing: Wound #1 Right Calcaneus: Iodoflex Secondary Dressing: Wound #1 Right Calcaneus: Boardered Foam Dressing Dressing Change Frequency: Wound #1 Right Calcaneus: Change dressing every other day. Follow-up Appointments: Wound #1  Right Calcaneus: Return Appointment in 1 week. I am going to suggest at this point that we continue with the above wound care measures for the next week. The patient is in agreement the plan. If anything changes in the interim they will let me know. Brendan [name], Brendan W. ([id number]) Please see above for specific wound care orders. We will see patient for re-evaluation in 1 week(s) here in the clinic. If anything worsens or changes patient will contact our office for additional recommendations. Electronic Signature(s) Signed: 01/07/2018 8:08:51 AM By: Lenda KelpStone III, Hoyt PA-C Entered By: Lenda KelpStone III, Hoyt on 01/04/2018 13:49:47 Brendan [name], Brendan W. ([id number]) -------------------------------------------------------------------------------- ROS/PFSH Details Patient Name: Brendan [name], Brendan W. Date of Service: 01/04/2018 12:30 PM Medical Record Number: [id number] Patient Account Number: [id number] Date of Birth/Sex: 01/23/1941 (77 y.o. M) Treating RN: Curtis Sitesorthy, Joanna Primary Care Provider:  Freddrick MarchAMIN, [name] Other Clinician: Referring Provider: Freddrick MarchAMIN, [name] Treating Provider/Extender: Linwood DibblesSTONE III, HOYT Weeks in Treatment: 1 Information Obtained From Patient Wound History Do you currently have one or more open woundso Yes How many open wounds do you currently haveo 1 Approximately how long have you had your woundso 1 month How have you been treating your wound(s) until nowo neosporin Has your wound(s) ever healed and then re-openedo No Have you had any lab work done in the past montho Yes [facility] Have you tested positive for an antibiotic resistant organism (MRSA, VRE)o No Have you tested positive for osteomyelitis (bone infection)o No Have you had any tests for circulation on your legso No Have you had other problems associated with your woundso Swelling Constitutional Symptoms (General Health) Complaints and Symptoms: Negative for: Fever; Chills Eyes Medical History: Negative for: Cataracts; Glaucoma; Optic Neuritis Ear/Nose/Mouth/Throat Medical History: Negative for: Chronic sinus problems/congestion; Middle ear problems Hematologic/Lymphatic Medical History: Positive for: Anemia Negative for: Hemophilia; Human Immunodeficiency Virus; Lymphedema; Sickle Cell Disease Respiratory Complaints and Symptoms: No Complaints or Symptoms Medical History: Positive for: Chronic Obstructive Pulmonary Disease (COPD) Negative for: Aspiration; Asthma; Pneumothorax; Sleep Apnea; Tuberculosis Cardiovascular Complaints and Symptoms: No Complaints or Symptoms Brendan [name], Brendan W. ([id number]) Medical History: Positive for: Coronary Artery Disease; Hypertension Past Medical History Notes: HLD Gastrointestinal Medical History: Negative for: Cirrhosis ; Colitis; Crohnos; Hepatitis A; Hepatitis B; Hepatitis C Endocrine Medical History: Negative for: Type I Diabetes; Type II Diabetes Genitourinary Medical History: Negative for: End Stage Renal  Disease Past Medical History Notes: self cath twice daily r/t retention Immunological Medical History: Negative for: Lupus Erythematosus; Raynaudos; Scleroderma Integumentary (Skin) Medical History: Negative for: History of Burn; History of pressure wounds Musculoskeletal Medical History: Positive for: Osteoarthritis Negative for: Gout; Rheumatoid Arthritis; Osteomyelitis Neurologic Medical History: Positive for: Neuropathy Negative for: Dementia; Quadriplegia; Paraplegia; Seizure Disorder Past Medical History Notes: 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, 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 Oncologic Medical History: Negative for: Received Chemotherapy; Received Radiation Psychiatric Complaints and Symptoms: No Complaints or Symptoms Medical History: Negative for: Anorexia/bulimia; Confinement Anxiety Brendan [name], Brendan W. ([id number]) Immunizations Pneumococcal Vaccine: Received Pneumococcal Vaccination: Yes Implantable Devices Family and Social History Cancer: No; Diabetes: No; Heart Disease: Yes - Siblings; Hereditary Spherocytosis: No; Hypertension: Yes - Siblings; Kidney Disease: No; Lung Disease: No; Seizures: No; Stroke: No; Thyroid Problems: No; Tuberculosis: No; Never smoker; Marital Status - Married; Alcohol Use: Never; Drug Use: No History; Caffeine Use: Rarely; Financial Concerns: No; Food, Clothing or Shelter Needs: No; Support System Lacking: No; Transportation Concerns: No; Advanced Directives: No; Patient does not want information on Advanced Directives Physician Affirmation  I have reviewed and agree with the above information. Electronic Signature(s) Signed: 01/04/2018 5:18:43 PM By: Curtis Sites Signed: 01/07/2018 8:08:51 AM By: Lenda Kelp PA-C Entered By: Lenda Kelp on 01/04/2018 13:49:14 Brendan [name] ([id number]) -------------------------------------------------------------------------------- SuperBill Details Patient Name: Brendan [name] Date of Service: 01/04/2018 Medical Record Number: [id number] Patient Account Number: [id number] Date of Birth/Sex: 02/06/1941 (77 y.o. M) Treating RN: Curtis Sites Primary Care Provider: Freddrick Brendan [name] Other Clinician: Referring Provider: Freddrick Brendan [name] Treating Provider/Extender: Linwood Dibbles, HOYT Weeks in Treatment: 1 Diagnosis Coding ICD-10 Codes Code Description [phone] Pressure ulcer of right heel, stage 3 I10 Essential (primary) hypertension I25.810 Atherosclerosis of coronary artery bypass graft(s) without angina pectoris Z96.89 Presence of other specified functional implants Facility Procedures CPT4 Code: [id number] Description: 11042 - DEB SUBQ TISSUE 20 SQ CM/< ICD-10 Diagnosis Description L89.613 Pressure ulcer of right heel, stage 3 Modifier: Quantity: 1 Physician Procedures CPT4 Code: [id number] Description: 11042 - WC PHYS SUBQ TISS 20 SQ CM ICD-10 Diagnosis Description L89.613 Pressure ulcer of right heel, stage 3 Modifier: Quantity: 1 Electronic Signature(s) Signed: 01/07/2018 8:08:51 AM By: Lenda Kelp PA-C Entered By: Lenda Kelp on 01/04/2018 13:49:55

## 2018-01-08 NOTE — Telephone Encounter (Signed)
Patient made aware of results and  Follow up appt on 06/11/17 at 10:45 am.

## 2018-01-11 ENCOUNTER — Encounter: Payer: PPO | Admitting: Physician Assistant

## 2018-01-11 DIAGNOSIS — L89613 Pressure ulcer of right heel, stage 3: Secondary | ICD-10-CM | POA: Diagnosis not present

## 2018-01-11 DIAGNOSIS — G629 Polyneuropathy, unspecified: Secondary | ICD-10-CM | POA: Diagnosis not present

## 2018-01-13 NOTE — Progress Notes (Signed)
ADOLPH, CLUTTER ([id number]) Visit Report for 01/11/2018 Arrival Information Details Patient Name: Brendan [name], Brendan [name] Date of Service: 01/11/2018 12:30 PM Medical Record Number: [id number] Patient Account Number: [id number] Date of Birth/Sex: 1941-01-18 (77 y.o. M) Treating RN: Curtis Sites Primary Care Dalessandro Baldyga: [name] Other Clinician: Referring Esterlene Atiyeh: [name] Treating Lakiah Dhingra/Extender: Linwood Dibbles, HOYT Weeks in Treatment: 2 Visit Information History Since Last Visit Added or deleted any medications: No Patient Arrived: Walker Any new allergies or adverse reactions: No Arrival Time: 12:37 Had a fall or experienced change in No Accompanied By: wife activities of daily living that may affect Transfer Assistance: None risk of falls: Patient Identification Verified: Yes Signs or symptoms of abuse/neglect since last visito No Secondary Verification Process Yes Hospitalized since last visit: No Completed: Implantable device outside of the clinic excluding No Patient Has Alerts: Yes cellular tissue based products placed in the center Patient Alerts: ABI 01/02/18 L 1.28 R since last visit: 1.55 Has Dressing in Place as Prescribed: Yes TBI L 1.03 R 0.95 Pain Present Now: Yes Electronic Signature(s) Signed: 01/11/2018 5:07:50 PM By: Curtis Sites Entered By: Curtis Sites on 01/11/2018 13:02:24 Brendan [name] ([id number]) -------------------------------------------------------------------------------- Encounter Discharge Information Details Patient Name: Brendan [name] Date of Service: 01/11/2018 12:30 PM Medical Record Number: [id number] Patient Account Number: [id number] Date of Birth/Sex: 1941/01/18 (77 y.o. M) Treating RN: Curtis Sites Primary Care Royelle Hinchman: [name] Other Clinician: Referring Mithra Spano: [name] Treating Tamara Monteith/Extender: Linwood Dibbles, HOYT Weeks in Treatment: 2 Encounter Discharge Information Items Discharge Condition:  Stable Ambulatory Status: Walker Discharge Destination: Home Transportation: Private Auto Accompanied By: wife Schedule Follow-up Appointment: Yes Clinical Summary of Care: Post Procedure Vitals: Temperature (F): 98.2 Pulse (bpm): 57 Respiratory Rate (breaths/min): 16 Blood Pressure (mmHg): 143/61 Electronic Signature(s) Signed: 01/11/2018 5:07:50 PM By: Curtis Sites Entered By: Curtis Sites on 01/11/2018 13:07:36 Brendan [name] ([id number]) -------------------------------------------------------------------------------- Lower Extremity Assessment Details Patient Name: Brendan [name] Date of Service: 01/11/2018 12:30 PM Medical Record Number: [id number] Patient Account Number: [id number] Date of Birth/Sex: 1941/03/14 (77 y.o. M) Treating RN: Renne Crigler Primary Care Math Brazie: [name] Other Clinician: Referring Amen Staszak: [name] Treating Cornell Bourbon/Extender: Linwood Dibbles, HOYT Weeks in Treatment: 2 Edema Assessment Assessed: [Left: No] [Right: No] [Left: Edema] [Right: :] Calf Left: Right: Point of Measurement: 35 cm From Medial Instep cm 38.5 cm Ankle Left: Right: Point of Measurement: 12 cm From Medial Instep cm 23.5 cm Vascular Assessment Claudication: Claudication Assessment [Right:None] Pulses: Dorsalis Pedis Palpable: [Right:Yes] Posterior Tibial Extremity colors, hair growth, and conditions: Extremity Color: [Right:Normal] Hair Growth on Extremity: [Right:No] Temperature of Extremity: [Right:Warm] Capillary Refill: [Right:< 3 seconds] Toe Nail Assessment Left: Right: Thick: No Discolored: No Deformed: No Improper Length and Hygiene: No Electronic Signature(s) Signed: 01/11/2018 4:22:48 PM By: Renne Crigler Entered By: Renne Crigler on 01/11/2018 12:46:29 Brendan [name] ([id number]) -------------------------------------------------------------------------------- Multi Wound Chart Details Patient Name: Brendan [name] Date of  Service: 01/11/2018 12:30 PM Medical Record Number: [id number] Patient Account Number: [id number] Date of Birth/Sex: 1940/10/19 (77 y.o. M) Treating RN: Curtis Sites Primary Care Theador Jezewski: [name] Other Clinician: Referring Jil Penland: [name] Treating Jb Dulworth/Extender: Linwood Dibbles, HOYT Weeks in Treatment: 2 Vital Signs Height(in): 66 Pulse(bpm): 57 Weight(lbs): 210 Blood Pressure(mmHg): 143/61 Body Mass Index(BMI): 34 Temperature(F): 98.2 Respiratory Rate 18 (breaths/min): Photos: [1:No Photos] [N/A:N/A] Wound Location: [1:Right Calcaneus] [N/A:N/A] Wounding Event: [1:Pressure Injury] [N/A:N/A] Primary Etiology: [1:Pressure Ulcer] [N/A:N/A] Comorbid History: [1:Anemia, Chronic Obstructive Pulmonary Disease (COPD), Coronary Artery Disease, Hypertension, Osteoarthritis, Neuropathy] [N/A:N/A] Date Acquired: [1:11/19/2017] [  N/A:N/A] Weeks of Treatment: [1:2] [N/A:N/A] Wound Status: [1:Open] [N/A:N/A] Measurements L x W x D [1:1.8x1.6x0.1] [N/A:N/A] (cm) Area (cm) : [1:2.262] [N/A:N/A] Volume (cm) : [1:0.226] [N/A:N/A] % Reduction in Area: [1:43.10%] [N/A:N/A] % Reduction in Volume: [1:43.10%] [N/A:N/A] Classification: [1:Category/Stage III] [N/A:N/A] Exudate Amount: [1:Medium] [N/A:N/A] Exudate Type: [1:Serous] [N/A:N/A] Exudate Color: [1:amber] [N/A:N/A] Wound Margin: [1:Flat and Intact] [N/A:N/A] Granulation Amount: [1:Medium (34-66%)] [N/A:N/A] Granulation Quality: [1:Red, Pink] [N/A:N/A] Necrotic Amount: [1:Medium (34-66%)] [N/A:N/A] Necrotic Tissue: [1:Eschar, Adherent Slough] [N/A:N/A] Exposed Structures: [1:Fat Layer (Subcutaneous Tissue) Exposed: Yes Fascia: No Tendon: No Muscle: No Joint: No Bone: No] [N/A:N/A] Epithelialization: [1:None] [N/A:N/A] Periwound Skin Texture: [1:Excoriation: No Induration: No] [N/A:N/A] Callus: No Crepitus: No Rash: No Scarring: No Periwound Skin Moisture: Maceration: No N/A N/A Dry/Scaly: No Periwound Skin  Color: Atrophie Blanche: No N/A N/A Cyanosis: No Ecchymosis: No Erythema: No Hemosiderin Staining: No Mottled: No Pallor: No Rubor: No Temperature: No Abnormality N/A N/A Tenderness on Palpation: Yes N/A N/A Wound Preparation: Ulcer Cleansing: N/A N/A Rinsed/Irrigated with Saline Topical Anesthetic Applied: Other: lidocaine 4% Treatment Notes Electronic Signature(s) Signed: 01/11/2018 5:07:50 PM By: Curtis Sites Entered By: Curtis Sites on 01/11/2018 12:59:12 Brendan [name] ([id number]) -------------------------------------------------------------------------------- Multi-Disciplinary Care Plan Details Patient Name: Brendan [name] Date of Service: 01/11/2018 12:30 PM Medical Record Number: [id number] Patient Account Number: [id number] Date of Birth/Sex: 1941/01/19 (77 y.o. M) Treating RN: Curtis Sites Primary Care Myles Mallicoat: [name] Other Clinician: Referring Saundra Gin: [name] Treating Trennon Torbeck/Extender: Linwood Dibbles, HOYT Weeks in Treatment: 2 Active Inactive ` Orientation to the Wound Care Program Nursing Diagnoses: Knowledge deficit related to the wound healing center program Goals: Patient/caregiver will verbalize understanding of the Wound Healing Center Program Date Initiated: 12/24/2017 Target Resolution Date: 01/14/2018 Goal Status: Active Interventions: Provide education on orientation to the wound center Notes: ` Wound/Skin Impairment Nursing Diagnoses: Impaired tissue integrity Goals: Patient/caregiver will verbalize understanding of skin care regimen Date Initiated: 12/24/2017 Target Resolution Date: 01/14/2018 Goal Status: Active Ulcer/skin breakdown will have a volume reduction of 30% by week 4 Date Initiated: 12/24/2017 Target Resolution Date: 01/14/2018 Goal Status: Active Interventions: Assess patient/caregiver ability to obtain necessary supplies Assess patient/caregiver ability to perform ulcer/skin care regimen upon admission and as  needed Assess ulceration(s) every visit Treatment Activities: Skin care regimen initiated : 12/24/2017 Notes: Electronic Signature(s) Signed: 01/11/2018 5:07:50 PM By: Alene Mires, Landis Martins ([id number]) Entered By: Curtis Sites on 01/11/2018 12:59:07 Brendan [name] ([id number]) -------------------------------------------------------------------------------- Pain Assessment Details Patient Name: Brendan [name] Date of Service: 01/11/2018 12:30 PM Medical Record Number: [id number] Patient Account Number: [id number] Date of Birth/Sex: 1941-04-10 (77 y.o. M) Treating RN: Curtis Sites Primary Care Dalores Weger: [name] Other Clinician: Referring Janis Sol: [name] Treating Siyah Mault/Extender: Linwood Dibbles, HOYT Weeks in Treatment: 2 Active Problems Location of Pain Severity and Description of Pain Patient Has Paino Yes Site Locations Rate the pain. Current Pain Level: 4 Pain Management and Medication Current Pain Management: Electronic Signature(s) Signed: 01/11/2018 2:59:28 PM By: Dayton Martes RCP, RRT, CHT Signed: 01/11/2018 5:07:50 PM By: Curtis Sites Entered By: Dayton Martes on 01/11/2018 12:38:13 Brendan [name] ([id number]) -------------------------------------------------------------------------------- Patient/Caregiver Education Details Patient Name: Brendan [name] Date of Service: 01/11/2018 12:30 PM Medical Record Number: [id number] Patient Account Number: [id number] Date of Birth/Gender: 1940/08/06 (77 y.o. M) Treating RN: Curtis Sites Primary Care Physician: [name] Other Clinician: Referring Physician: [name] Treating Physician/Extender: Skeet Simmer in Treatment: 2 Education Assessment Education Provided To: Patient and Caregiver Education Topics Provided Wound/Skin Impairment:  Handouts: Other: wound care as ordered Methods: Demonstration, Explain/Verbal Responses: State content  correctly Electronic Signature(s) Signed: 01/11/2018 5:07:50 PM By: Curtis Sites Entered By: Curtis Sites on 01/11/2018 13:06:36 Brendan [name] ([id number]) -------------------------------------------------------------------------------- Wound Assessment Details Patient Name: Brendan [name] Date of Service: 01/11/2018 12:30 PM Medical Record Number: [id number] Patient Account Number: [id number] Date of Birth/Sex: 1940/10/20 (77 y.o. M) Treating RN: Renne Crigler Primary Care Konnor Jorden: [name] Other Clinician: Referring Salia Cangemi: [name] Treating Rhodes Calvert/Extender: Linwood Dibbles, HOYT Weeks in Treatment: 2 Wound Status Wound Number: 1 Primary Pressure Ulcer Etiology: Wound Location: Right Calcaneus Wound Open Wounding Event: Pressure Injury Status: Date Acquired: 11/19/2017 Comorbid Anemia, Chronic Obstructive Pulmonary Weeks Of Treatment: 2 History: Disease (COPD), Coronary Artery Disease, Clustered Wound: No Hypertension, Osteoarthritis, Neuropathy Photos Photo Uploaded By: Renne Crigler on 01/11/2018 16:20:26 Wound Measurements Length: (cm) 1.8 Width: (cm) 1.6 Depth: (cm) 0.1 Area: (cm) 2.262 Volume: (cm) 0.226 % Reduction in Area: 43.1% % Reduction in Volume: 43.1% Epithelialization: None Tunneling: No Undermining: No Wound Description Classification: Category/Stage III Wound Margin: Flat and Intact Exudate Amount: Medium Exudate Type: Serous Exudate Color: amber Foul Odor After Cleansing: No Slough/Fibrino Yes Wound Bed Granulation Amount: Medium (34-66%) Exposed Structure Granulation Quality: Red, Pink Fascia Exposed: No Necrotic Amount: Medium (34-66%) Fat Layer (Subcutaneous Tissue) Exposed: Yes Necrotic Quality: Eschar, Adherent Slough Tendon Exposed: No Muscle Exposed: No Joint Exposed: No Bone Exposed: No Periwound Skin Texture GAELAN, GLENNON. ([id number]) Texture Color No Abnormalities Noted: No No Abnormalities Noted:  No Callus: No Atrophie Blanche: No Crepitus: No Cyanosis: No Excoriation: No Ecchymosis: No Induration: No Erythema: No Rash: No Hemosiderin Staining: No Scarring: No Mottled: No Pallor: No Moisture Rubor: No No Abnormalities Noted: No Dry / Scaly: No Temperature / Pain Maceration: No Temperature: No Abnormality Tenderness on Palpation: Yes Wound Preparation Ulcer Cleansing: Rinsed/Irrigated with Saline Topical Anesthetic Applied: Other: lidocaine 4%, Treatment Notes Wound #1 (Right Calcaneus) 1. Cleansed with: Clean wound with Normal Saline 2. Anesthetic Topical Lidocaine 4% cream to wound bed prior to debridement 3. Peri-wound Care: Skin Prep 4. Dressing Applied: Prisma Ag 5. Secondary Dressing Applied Bordered Foam Dressing Notes netting Electronic Signature(s) Signed: 01/11/2018 4:22:48 PM By: Renne Crigler Entered By: Renne Crigler on 01/11/2018 12:45:11 Brendan [name] ([id number]) -------------------------------------------------------------------------------- Vitals Details Patient Name: Brendan [name] Date of Service: 01/11/2018 12:30 PM Medical Record Number: [id number] Patient Account Number: [id number] Date of Birth/Sex: [birth year] (77 y.o. M) Treating RN: Curtis Sites Primary Care Katalena Malveaux: [name] Other Clinician: Referring Westlynn Fifer: [name] Treating Kamaron Deskins/Extender: Linwood Dibbles, HOYT Weeks in Treatment: 2 Vital Signs Time Taken: 12:35 Temperature (F): 98.2 Height (in): 66 Pulse (bpm): 57 Weight (lbs): 210 Respiratory Rate (breaths/min): 18 Body Mass Index (BMI): 33.9 Blood Pressure (mmHg): 143/61 Reference Range: 80 - 120 mg / dl Electronic Signature(s) Signed: 01/11/2018 2:59:28 PM By: Dayton Martes RCP, RRT, CHT Entered By: Dayton Martes on 01/11/2018 12:40:36

## 2018-01-13 NOTE — Progress Notes (Signed)
SHAYN, Brendan [name] ([id number]) Visit Report for 01/11/2018 Chief Complaint Document Details Patient Name: Brendan [name], Brendan [name] Date of Service: 01/11/2018 12:30 PM Medical Record Number: [id number] Patient Account Number: [id number] Date of Birth/Sex: 08-15-1940 (77 y.o. M) Treating RN: Curtis Sites Primary Care Provider: [name] Other Clinician: Referring Provider: [name] Treating Provider/Extender: Linwood Dibbles, HOYT Weeks in Treatment: 2 Information Obtained from: Patient Chief Complaint Right heel ulcer Electronic Signature(s) Signed: 01/11/2018 5:13:23 PM By: Lenda Kelp PA-C Entered By: Lenda Kelp on 01/11/2018 12:56:04 Brendan [name] ([id number]) -------------------------------------------------------------------------------- Debridement Details Patient Name: Brendan [name] Date of Service: 01/11/2018 12:30 PM Medical Record Number: [id number] Patient Account Number: [id number] Date of Birth/Sex: 28-Jun-1940 (77 y.o. M) Treating RN: Curtis Sites Primary Care Provider: [name] Other Clinician: Referring Provider: [name] Treating Provider/Extender: Linwood Dibbles, HOYT Weeks in Treatment: 2 Debridement Performed for Wound #1 Right Calcaneus Assessment: Performed By: Physician STONE III, HOYT E., PA-C Debridement Type: Debridement Level of Consciousness (Pre- Awake and Alert procedure): Pre-procedure Verification/Time Yes - 13:01 Out Taken: Start Time: 13:01 Pain Control: Lidocaine 4% Topical Solution Total Area Debrided (L x W): 1.8 (cm) x 1.6 (cm) = 2.88 (cm) Tissue and other material Viable, Non-Viable, Slough, Subcutaneous, Slough debrided: Level: Skin/Subcutaneous Tissue Debridement Description: Excisional Instrument: Curette Bleeding: Minimum Hemostasis Achieved: Pressure End Time: 13:04 Procedural Pain: 0 Post Procedural Pain: 0 Response to Treatment: Procedure was tolerated well Level of Consciousness Awake and  Alert (Post-procedure): Post Debridement Measurements of Total Wound Length: (cm) 1.8 Stage: Category/Stage III Width: (cm) 1.6 Depth: (cm) 0.2 Volume: (cm) 0.452 Character of Wound/Ulcer Post Improved Debridement: Post Procedure Diagnosis Same as Pre-procedure Electronic Signature(s) Signed: 01/11/2018 5:07:50 PM By: Curtis Sites Signed: 01/11/2018 5:13:23 PM By: Lenda Kelp PA-C Entered By: Curtis Sites on 01/11/2018 13:05:47 Brendan [name] ([id number]) -------------------------------------------------------------------------------- HPI Details Patient Name: Brendan [name] Date of Service: 01/11/2018 12:30 PM Medical Record Number: [id number] Patient Account Number: [id number] Date of Birth/Sex: 15-Oct-1940 (77 y.o. M) Treating RN: Curtis Sites Primary Care Provider: [name] Other Clinician: Referring Provider: [name] Treating Provider/Extender: Linwood Dibbles, HOYT Weeks in Treatment: 2 History of Present Illness HPI Description: 12/24/17 on evaluation today patient's is seen for initial evaluation concerning issues he's been having with his right heel for one month. According to the patient and his wife he sustained a fall which led to this injury. With that being said the appearance of the wound as well as the location of the wound seems to indicate more a pressure type injury in my pinion. Nonetheless whether pressure or otherwise abrasion the patient does have depth to subcutaneous tissue there is some Slough noted as well. He fortunately has no significant discomfort although he does have peripheral vascular disease as evidenced by the x-ray which was performed and showed no acute bone abnormality but there was purple vascular calcification noted. He has had a previous heart attack with bypass surgery subsequent and has a current ejection fraction of 42 for the 5% on the last check. He does have a history of hypertension as well as COPD as well. The  patient states overall things seem to be doing a little bit better but nonetheless they did want to come in to be checked out. Unfortunately they could not get into [place] in a sufficient amount of time they live in [place] therefore they are coming to [place] to see me here. I do believe we may want to send for vascular testing in that  case they would like to go to [facility] for that. His ABI's performed here in the office were noncompressible at 1.54 left in 1.46 right. No fevers, chills, nausea, or vomiting noted at this time. Patient does have a brain stimulator due to trimmers that he was experiencing. According to the patient's wife this is MRI compatible according to their physician. They just have to turn off the stimulation. 01/04/18 on evaluation today patient is seen for reevaluation concerning his right heel ulcer. He has had the arterial studies with ABI and TBI performed and this was very good. The right ABI was 1.55 with a TBI of 1.03 the left ABI was 1.28 with a TBI of 0.95. The patient's wound does have some necrotic tissue still overlying the surface of the wound. Fortunately there does not appear to be any evidence of infection at this time. 01/11/18 on evaluation today patient actually appears to be doing fairly well in regard to his heel ulcer. He has been tolerating the dressing changes without complication. Fortunately there's no evidence of infection. He does have some Slough on the surface of the wound. Electronic Signature(s) Signed: 01/11/2018 5:13:23 PM By: Lenda Kelp PA-C Entered By: Lenda Kelp on 01/11/2018 17:05:32 Brendan [name] ([id number]) -------------------------------------------------------------------------------- Physical Exam Details Patient Name: Brendan [name] Date of Service: 01/11/2018 12:30 PM Medical Record Number: [id number] Patient Account Number: [id number] Date of Birth/Sex: January 05, 1941 (77 y.o. M) Treating RN: Curtis Sites Primary Care Provider: [name] Other Clinician: Referring Provider: [name] Treating Provider/Extender: STONE III, HOYT Weeks in Treatment: 2 Constitutional Well-nourished and well-hydrated in no acute distress. Respiratory normal breathing without difficulty. Psychiatric this patient is able to make decisions and demonstrates good insight into disease process. Alert and Oriented x 3. pleasant and cooperative. Notes At this point patient's wound bed did require some sharp debridement which he agreed to today. I was able to actually debride away the necrotic material from the surface of the wing without complication post debridement the wound bed appears to be doing much better. In general I'm very pleased with how things are at this time. Electronic Signature(s) Signed: 01/11/2018 5:13:23 PM By: Lenda Kelp PA-C Entered By: Lenda Kelp on 01/11/2018 17:06:08 Brendan [name] ([id number]) -------------------------------------------------------------------------------- Physician Orders Details Patient Name: Brendan [name] Date of Service: 01/11/2018 12:30 PM Medical Record Number: [id number] Patient Account Number: [id number] Date of Birth/Sex: 02/08/1941 (77 y.o. M) Treating RN: Curtis Sites Primary Care Provider: [name] Other Clinician: Referring Provider: [name] Treating Provider/Extender: Linwood Dibbles, HOYT Weeks in Treatment: 2 Verbal / Phone Orders: No Diagnosis Coding ICD-10 Coding Code Description L89.613 Pressure ulcer of right heel, stage 3 I10 Essential (primary) hypertension I25.810 Atherosclerosis of coronary artery bypass graft(s) without angina pectoris Z96.89 Presence of other specified functional implants Wound Cleansing Wound #1 Right Calcaneus o Clean wound with Normal Saline. Anesthetic (add to Medication List) o Topical Lidocaine 4% cream applied to wound bed prior to debridement (In Clinic Only). Skin  Barriers/Peri-Wound Care Wound #1 Right Calcaneus o Skin Prep Primary Wound Dressing Wound #1 Right Calcaneus o Silver Collagen Secondary Dressing Wound #1 Right Calcaneus o Boardered Foam Dressing Dressing Change Frequency Wound #1 Right Calcaneus o Change dressing every other day. Follow-up Appointments Wound #1 Right Calcaneus o Return Appointment in 1 week. Electronic Signature(s) Signed: 01/11/2018 5:07:50 PM By: Curtis Sites Signed: 01/11/2018 5:13:23 PM By: Lenda Kelp PA-C Entered By: Curtis Sites on 01/11/2018 13:06:12 Brendan [name] ([id number]) -------------------------------------------------------------------------------- Problem  List Details Patient Name: Brendan [name], Brendan [name] Date of Service: 01/11/2018 12:30 PM Medical Record Number: [id number] Patient Account Number: [id number] Date of Birth/Sex: Jan 13, 1941 (77 y.o. M) Treating RN: Curtis Sites Primary Care Provider: [name] Other Clinician: Referring Provider: [name] Treating Provider/Extender: Linwood Dibbles, HOYT Weeks in Treatment: 2 Active Problems ICD-10 Evaluated Encounter Code Description Active Date Today Diagnosis L89.613 Pressure ulcer of right heel, stage 3 12/24/2017 No Yes I10 Essential (primary) hypertension 12/24/2017 No Yes I25.810 Atherosclerosis of coronary artery bypass graft(s) without 12/24/2017 No Yes angina pectoris Z96.89 Presence of other specified functional implants 12/24/2017 No Yes Inactive Problems Resolved Problems Electronic Signature(s) Signed: 01/11/2018 5:13:23 PM By: Lenda Kelp PA-C Entered By: Lenda Kelp on 01/11/2018 12:55:51 Brendan [name] ([id number]) -------------------------------------------------------------------------------- Progress Note Details Patient Name: Brendan [name] Date of Service: 01/11/2018 12:30 PM Medical Record Number: [id number] Patient Account Number: [id number] Date of Birth/Sex: Apr 19, 1940 (77 y.o. M) Treating  RN: Curtis Sites Primary Care Provider: [name] Other Clinician: Referring Provider: [name] Treating Provider/Extender: Linwood Dibbles, HOYT Weeks in Treatment: 2 Subjective Chief Complaint Information obtained from Patient Right heel ulcer History of Present Illness (HPI) 12/24/17 on evaluation today patient's is seen for initial evaluation concerning issues he's been having with his right heel for one month. According to the patient and his wife he sustained a fall which led to this injury. With that being said the appearance of the wound as well as the location of the wound seems to indicate more a pressure type injury in my pinion. Nonetheless whether pressure or otherwise abrasion the patient does have depth to subcutaneous tissue there is some Slough noted as well. He fortunately has no significant discomfort although he does have peripheral vascular disease as evidenced by the x-ray which was performed and showed no acute bone abnormality but there was purple vascular calcification noted. He has had a previous heart attack with bypass surgery subsequent and has a current ejection fraction of 42 for the 5% on the last check. He does have a history of hypertension as well as COPD as well. The patient states overall things seem to be doing a little bit better but nonetheless they did want to come in to be checked out. Unfortunately they could not get into [place] in a sufficient amount of time they live in [place] therefore they are coming to [place] to see me here. I do believe we may want to send for vascular testing in that case they would like to go to [facility] for that. His ABI's performed here in the office were noncompressible at 1.54 left in 1.46 right. No fevers, chills, nausea, or vomiting noted at this time. Patient does have a brain stimulator due to trimmers that he was experiencing. According to the patient's wife this is MRI compatible according to  their physician. They just have to turn off the stimulation. 01/04/18 on evaluation today patient is seen for reevaluation concerning his right heel ulcer. He has had the arterial studies with ABI and TBI performed and this was very good. The right ABI was 1.55 with a TBI of 1.03 the left ABI was 1.28 with a TBI of 0.95. The patient's wound does have some necrotic tissue still overlying the surface of the wound. Fortunately there does not appear to be any evidence of infection at this time. 01/11/18 on evaluation today patient actually appears to be doing fairly well in regard to his heel ulcer. He has been tolerating the dressing  changes without complication. Fortunately there's no evidence of infection. He does have some Slough on the surface of the wound. Patient History Information obtained from Patient. Family History Heart Disease - Siblings, Hypertension - Siblings, No family history of Cancer, Diabetes, Hereditary Spherocytosis, Kidney Disease, Lung Disease, Seizures, Stroke, Thyroid Problems, Tuberculosis. Social History Never smoker, Marital Status - Married, Alcohol Use - Never, Drug Use - No History, Caffeine Use - Rarely. Medical And Surgical History Notes Cardiovascular HLD Genitourinary self cath twice daily r/t retention Brendan [name], Brendan [name] ([id number]) Neurologic 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, 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 Review of Systems (ROS) Constitutional Symptoms (General Health) Denies complaints or symptoms of Fever, Chills. Respiratory The patient has no complaints or symptoms. Cardiovascular The patient has no complaints or symptoms. Psychiatric The patient has no complaints or symptoms. Objective Constitutional Well-nourished and well-hydrated in no acute distress. Vitals Time Taken: 12:35 PM, Height: 66 in, Weight: 210 lbs, BMI: 33.9, Temperature: 98.2 F, Pulse: 57 bpm, Respiratory Rate: 18 breaths/min, Blood Pressure: 143/61  mmHg. Respiratory normal breathing without difficulty. Psychiatric this patient is able to make decisions and demonstrates good insight into disease process. Alert and Oriented x 3. pleasant and cooperative. General Notes: At this point patient's wound bed did require some sharp debridement which he agreed to today. I was able to actually debride away the necrotic material from the surface of the wing without complication post debridement the wound bed appears to be doing much better. In general I'm very pleased with how things are at this time. Integumentary (Hair, Skin) Wound #1 status is Open. Original cause of wound was Pressure Injury. The wound is located on the Right Calcaneus. The wound measures 1.8cm length x 1.6cm width x 0.1cm depth; 2.262cm^2 area and 0.226cm^3 volume. There is Fat Layer (Subcutaneous Tissue) Exposed exposed. There is no tunneling or undermining noted. There is a medium amount of serous drainage noted. The wound margin is flat and intact. There is medium (34-66%) red, pink granulation within the wound bed. There is a medium (34-66%) amount of necrotic tissue within the wound bed including Eschar and Adherent Slough. The periwound skin appearance did not exhibit: Callus, Crepitus, Excoriation, Induration, Rash, Scarring, Dry/Scaly, Maceration, Atrophie Blanche, Cyanosis, Ecchymosis, Hemosiderin Staining, Mottled, Pallor, Rubor, Erythema. Periwound temperature was noted as No Abnormality. The periwound has tenderness on palpation. Assessment Brendan [name], Brendan [name] ([id number]) Active Problems ICD-10 Pressure ulcer of right heel, stage 3 Essential (primary) hypertension Atherosclerosis of coronary artery bypass graft(s) without angina pectoris Presence of other specified functional implants Procedures Wound #1 Pre-procedure diagnosis of Wound #1 is a Pressure Ulcer located on the Right Calcaneus . There was a Excisional Skin/Subcutaneous Tissue Debridement with a  total area of 2.88 sq cm performed by STONE III, HOYT E., PA-C. With the following instrument(s): Curette to remove Viable and Non-Viable tissue/material. Material removed includes Subcutaneous Tissue and Slough and after achieving pain control using Lidocaine 4% Topical Solution. No specimens were taken. A time out was conducted at 13:01, prior to the start of the procedure. A Minimum amount of bleeding was controlled with Pressure. The procedure was tolerated well with a pain level of 0 throughout and a pain level of 0 following the procedure. Post Debridement Measurements: 1.8cm length x 1.6cm width x 0.2cm depth; 0.452cm^3 volume. Post debridement Stage noted as Category/Stage III. Character of Wound/Ulcer Post Debridement is improved. Post procedure Diagnosis Wound #1: Same as Pre-Procedure Plan Wound Cleansing: Wound #1 Right Calcaneus: Clean wound with  Normal Saline. Anesthetic (add to Medication List): Topical Lidocaine 4% cream applied to wound bed prior to debridement (In Clinic Only). Skin Barriers/Peri-Wound Care: Wound #1 Right Calcaneus: Skin Prep Primary Wound Dressing: Wound #1 Right Calcaneus: Silver Collagen Secondary Dressing: Wound #1 Right Calcaneus: Boardered Foam Dressing Dressing Change Frequency: Wound #1 Right Calcaneus: Change dressing every other day. Follow-up Appointments: Wound #1 Right Calcaneus: Return Appointment in 1 week. Brendan [name] ([id number]) I am going to suggest that we continue with the above wound care measures for the next week. The patient is in agreement the plan. If anything changes or worsens in the meantime he will contact the office for additional recommendations. Please see above for specific wound care orders. We will see patient for re-evaluation in 1 week(s) here in the clinic. If anything worsens or changes patient will contact our office for additional recommendations. Electronic Signature(s) Signed: 01/11/2018 5:13:23  PM By: Lenda Kelp PA-C Entered By: Lenda Kelp on 01/11/2018 17:06:30 Brendan [name] ([id number]) -------------------------------------------------------------------------------- ROS/PFSH Details Patient Name: Brendan [name] Date of Service: 01/11/2018 12:30 PM Medical Record Number: [id number] Patient Account Number: [id number] Date of Birth/Sex: 1940-12-24 (77 y.o. M) Treating RN: Curtis Sites Primary Care Provider: [name] Other Clinician: Referring Provider: [name] Treating Provider/Extender: Linwood Dibbles, HOYT Weeks in Treatment: 2 Information Obtained From Patient Wound History Do you currently have one or more open woundso Yes How many open wounds do you currently haveo 1 Approximately how long have you had your woundso 1 month How have you been treating your wound(s) until nowo neosporin Has your wound(s) ever healed and then re-openedo No Have you had any lab work done in the past montho Yes Who ordered the lab work [facility]o [facility] Have you tested positive for an antibiotic resistant organism (MRSA, VRE)o No Have you tested positive for osteomyelitis (bone infection)o No Have you had any tests for circulation on your legso No Have you had other problems associated with your woundso Swelling Constitutional Symptoms (General Health) Complaints and Symptoms: Negative for: Fever; Chills Eyes Medical History: Negative for: Cataracts; Glaucoma; Optic Neuritis Ear/Nose/Mouth/Throat Medical History: Negative for: Chronic sinus problems/congestion; Middle ear problems Hematologic/Lymphatic Medical History: Positive for: Anemia Negative for: Hemophilia; Human Immunodeficiency Virus; Lymphedema; Sickle Cell Disease Respiratory Complaints and Symptoms: No Complaints or Symptoms Medical History: Positive for: Chronic Obstructive Pulmonary Disease (COPD) Negative for: Aspiration; Asthma; Pneumothorax; Sleep Apnea; Tuberculosis Cardiovascular Complaints and  Symptoms: No Complaints or Symptoms Brendan [name], Brendan [name] ([id number]) Medical History: Positive for: Coronary Artery Disease; Hypertension Past Medical History Notes: HLD Gastrointestinal Medical History: Negative for: Cirrhosis ; Colitis; Crohnos; Hepatitis A; Hepatitis B; Hepatitis C Endocrine Medical History: Negative for: Type I Diabetes; Type II Diabetes Genitourinary Medical History: Negative for: End Stage Renal Disease Past Medical History Notes: self cath twice daily r/t retention Immunological Medical History: Negative for: Lupus Erythematosus; Raynaudos; Scleroderma Integumentary (Skin) Medical History: Negative for: History of Burn; History of pressure wounds Musculoskeletal Medical History: Positive for: Osteoarthritis Negative for: Gout; Rheumatoid Arthritis; Osteomyelitis Neurologic Medical History: Positive for: Neuropathy Negative for: Dementia; Quadriplegia; Paraplegia; Seizure Disorder Past Medical History Notes: 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, 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 Oncologic Medical History: Negative for: Received Chemotherapy; Received Radiation Psychiatric Complaints and Symptoms: No Complaints or Symptoms Medical History: Negative for: Anorexia/bulimia; Confinement Anxiety Brendan [name], Brendan [name] ([id number]) Immunizations Pneumococcal Vaccine: Received Pneumococcal Vaccination: Yes Implantable Devices Family and Social History Cancer: No; Diabetes: No; Heart Disease: Yes - Siblings; Hereditary  Spherocytosis: No; Hypertension: Yes - Siblings; Kidney Disease: No; Lung Disease: No; Seizures: No; Stroke: No; Thyroid Problems: No; Tuberculosis: No; Never smoker; Marital Status - Married; Alcohol Use: Never; Drug Use: No History; Caffeine Use: Rarely; Financial Concerns: No; Food, Clothing or Shelter Needs: No; Support System Lacking: No; Transportation Concerns: No; Advanced Directives: No; Patient does not want information on Advanced  Directives Physician Affirmation I have reviewed and agree with the above information. Electronic Signature(s) Signed: 01/11/2018 5:07:50 PM By: Curtis Sites Signed: 01/11/2018 5:13:23 PM By: Lenda Kelp PA-C Entered By: Lenda Kelp on 01/11/2018 17:05:50 Brendan [name] ([id number]) -------------------------------------------------------------------------------- SuperBill Details Patient Name: Brendan [name] Date of Service: 01/11/2018 Medical Record Number: [id number] Patient Account Number: [id number] Date of Birth/Sex: 02/05/1941 (77 y.o. M) Treating RN: Curtis Sites Primary Care Provider: [name] Other Clinician: Referring Provider: [name] Treating Provider/Extender: Linwood Dibbles, HOYT Weeks in Treatment: 2 Diagnosis Coding ICD-10 Codes Code Description [phone] Pressure ulcer of right heel, stage 3 I10 Essential (primary) hypertension I25.810 Atherosclerosis of coronary artery bypass graft(s) without angina pectoris Z96.89 Presence of other specified functional implants Facility Procedures CPT4 Code: [id number] Description: 11042 - DEB SUBQ TISSUE 20 SQ CM/< ICD-10 Diagnosis Description L89.613 Pressure ulcer of right heel, stage 3 Modifier: Quantity: 1 Physician Procedures CPT4 Code: [id number] Description: 11042 - WC PHYS SUBQ TISS 20 SQ CM ICD-10 Diagnosis Description L89.613 Pressure ulcer of right heel, stage 3 Modifier: Quantity: 1 Electronic Signature(s) Signed: 01/11/2018 5:13:23 PM By: Lenda Kelp PA-C Entered By: Lenda Kelp on 01/11/2018 17:06:39

## 2018-01-14 DIAGNOSIS — I10 Essential (primary) hypertension: Secondary | ICD-10-CM | POA: Diagnosis not present

## 2018-01-14 DIAGNOSIS — I25810 Atherosclerosis of coronary artery bypass graft(s) without angina pectoris: Secondary | ICD-10-CM | POA: Diagnosis not present

## 2018-01-14 DIAGNOSIS — L89613 Pressure ulcer of right heel, stage 3: Secondary | ICD-10-CM | POA: Diagnosis not present

## 2018-01-14 DIAGNOSIS — N401 Enlarged prostate with lower urinary tract symptoms: Secondary | ICD-10-CM | POA: Diagnosis not present

## 2018-01-14 DIAGNOSIS — Z9689 Presence of other specified functional implants: Secondary | ICD-10-CM | POA: Diagnosis not present

## 2018-01-14 DIAGNOSIS — R338 Other retention of urine: Secondary | ICD-10-CM | POA: Diagnosis not present

## 2018-01-18 ENCOUNTER — Encounter: Payer: PPO | Attending: Physician Assistant | Admitting: Physician Assistant

## 2018-01-18 DIAGNOSIS — I25810 Atherosclerosis of coronary artery bypass graft(s) without angina pectoris: Secondary | ICD-10-CM | POA: Diagnosis not present

## 2018-01-18 DIAGNOSIS — G629 Polyneuropathy, unspecified: Secondary | ICD-10-CM | POA: Insufficient documentation

## 2018-01-18 DIAGNOSIS — I10 Essential (primary) hypertension: Secondary | ICD-10-CM | POA: Diagnosis not present

## 2018-01-18 DIAGNOSIS — J449 Chronic obstructive pulmonary disease, unspecified: Secondary | ICD-10-CM | POA: Diagnosis not present

## 2018-01-18 DIAGNOSIS — L89613 Pressure ulcer of right heel, stage 3: Secondary | ICD-10-CM | POA: Diagnosis not present

## 2018-01-23 ENCOUNTER — Telehealth: Payer: Self-pay | Admitting: Neurology

## 2018-01-23 ENCOUNTER — Other Ambulatory Visit: Payer: Self-pay | Admitting: Emergency Medicine

## 2018-01-23 MED ORDER — GABAPENTIN 300 MG PO CAPS: 300.0000 mg | ORAL_CAPSULE | Freq: Every day | ORAL | 1 refills | Status: DC

## 2018-01-23 NOTE — Telephone Encounter (Signed)
Patients wife states she has to break up pts 600mg  gabapentin medication and they are getting stuck in patients throat. Patients wife wanting to know if Dr.Tat can just prescribed the 300mg  tablets for the patient.

## 2018-01-23 NOTE — Telephone Encounter (Signed)
Gabapentin reduced to 300 mg daily last visit. RX sent for 300 mg tablets since trouble swallowing the pill cut in half. Patient's wife made aware.

## 2018-01-24 NOTE — Progress Notes (Signed)
NICKOLUS, WADDING ([id number]) Visit Report for 01/18/2018 Arrival Information Details Patient Name: Brendan [name], Brendan [name] Date of Service: 01/18/2018 3:15 PM Medical Record Number: [id number] Patient Account Number: [id number] Date of Birth/Sex: 1940/09/14 (77 y.o. M) Treating RN: Curtis Sites Primary Care Ebba Goll: [name] Other Clinician: Referring Coralie Stanke: [name] Treating Hala Narula/Extender: Linwood Dibbles, HOYT Weeks in Treatment: 3 Visit Information History Since Last Visit Added or deleted any medications: No Patient Arrived: Ambulatory Any new allergies or adverse reactions: No Arrival Time: 15:36 Had a fall or experienced change in No Accompanied By: wife activities of daily living that may affect Transfer Assistance: None risk of falls: Patient Identification Verified: Yes Signs or symptoms of abuse/neglect since last visito No Secondary Verification Process Yes Hospitalized since last visit: No Completed: Implantable device outside of the clinic excluding No Patient Has Alerts: Yes cellular tissue based products placed in the center Patient Alerts: ABI 01/02/18 L 1.28 R since last visit: 1.55 Has Dressing in Place as Prescribed: Yes TBI L 1.03 R 0.95 Pain Present Now: Yes Electronic Signature(s) Signed: 01/18/2018 4:03:51 PM By: Dayton Martes RCP, RRT, CHT Entered By: Dayton Martes on 01/18/2018 15:37:33 Brendan [name] ([id number]) -------------------------------------------------------------------------------- Clinic Level of Care Assessment Details Patient Name: Brendan [name] Date of Service: 01/18/2018 3:15 PM Medical Record Number: [id number] Patient Account Number: [id number] Date of Birth/Sex: 01-22-1941 (77 y.o. M) Treating RN: Renne Crigler Primary Care Araf Clugston: [name] Other Clinician: Referring Davit Vassar: [name] Treating Kensli Bowley/Extender: Linwood Dibbles, HOYT Weeks in Treatment: 3 Clinic Level of Care  Assessment Items TOOL 4 Quantity Score X - Use when only an EandM is performed on FOLLOW-UP visit 1 0 ASSESSMENTS - Nursing Assessment / Reassessment X - Reassessment of Co-morbidities (includes updates in patient status) 1 10 X- 1 5 Reassessment of Adherence to Treatment Plan ASSESSMENTS - Wound and Skin Assessment / Reassessment X - Simple Wound Assessment / Reassessment - one wound 1 5 []  - 0 Complex Wound Assessment / Reassessment - multiple wounds []  - 0 Dermatologic / Skin Assessment (not related to wound area) ASSESSMENTS - Focused Assessment []  - Circumferential Edema Measurements - multi extremities 0 []  - 0 Nutritional Assessment / Counseling / Intervention []  - 0 Lower Extremity Assessment (monofilament, tuning fork, pulses) []  - 0 Peripheral Arterial Disease Assessment (using hand held doppler) ASSESSMENTS - Ostomy and/or Continence Assessment and Care []  - Incontinence Assessment and Management 0 []  - 0 Ostomy Care Assessment and Management (repouching, etc.) PROCESS - Coordination of Care X - Simple Patient / Family Education for ongoing care 1 15 []  - 0 Complex (extensive) Patient / Family Education for ongoing care []  - 0 Staff obtains Chiropractor, Records, Test Results / Process Orders []  - 0 Staff telephones HHA, Nursing Homes / Clarify orders / etc []  - 0 Routine Transfer to another Facility (non-emergent condition) []  - 0 Routine Hospital Admission (non-emergent condition) []  - 0 New Admissions / Manufacturing engineer / Ordering NPWT, Apligraf, etc. []  - 0 Emergency Hospital Admission (emergent condition) X- 1 10 Simple Discharge Coordination THELMER, LEGLER ([id number]) []  - 0 Complex (extensive) Discharge Coordination PROCESS - Special Needs []  - Pediatric / Minor Patient Management 0 []  - 0 Isolation Patient Management []  - 0 Hearing / Language / Visual special needs []  - 0 Assessment of Community assistance (transportation, D/C planning,  etc.) []  - 0 Additional assistance / Altered mentation []  - 0 Support Surface(s) Assessment (bed, cushion, seat, etc.) INTERVENTIONS - Wound Cleansing / Measurement X -  Simple Wound Cleansing - one wound 1 5 []  - 0 Complex Wound Cleansing - multiple wounds X- 1 5 Wound Imaging (photographs - any number of wounds) []  - 0 Wound Tracing (instead of photographs) X- 1 5 Simple Wound Measurement - one wound []  - 0 Complex Wound Measurement - multiple wounds INTERVENTIONS - Wound Dressings X - Small Wound Dressing one or multiple wounds 1 10 []  - 0 Medium Wound Dressing one or multiple wounds []  - 0 Large Wound Dressing one or multiple wounds []  - 0 Application of Medications - topical []  - 0 Application of Medications - injection INTERVENTIONS - Miscellaneous []  - External ear exam 0 []  - 0 Specimen Collection (cultures, biopsies, blood, body fluids, etc.) []  - 0 Specimen(s) / Culture(s) sent or taken to Lab for analysis []  - 0 Patient Transfer (multiple staff / Nurse, adult / Similar devices) []  - 0 Simple Staple / Suture removal (25 or less) []  - 0 Complex Staple / Suture removal (26 or more) []  - 0 Hypo / Hyperglycemic Management (close monitor of Blood Glucose) []  - 0 Ankle / Brachial Index (ABI) - do not check if billed separately X- 1 5 Vital Signs JUN, OSMENT ([id number]) Has the patient been seen at the hospital within the last three years: Yes Total Score: 75 Level Of Care: New/Established - Level 2 Electronic Signature(s) Signed: 01/21/2018 4:35:19 PM By: Renne Crigler Entered By: Renne Crigler on 01/18/2018 16:35:46 Brendan [name] ([id number]) -------------------------------------------------------------------------------- Encounter Discharge Information Details Patient Name: Brendan [name] Date of Service: 01/18/2018 3:15 PM Medical Record Number: [id number] Patient Account Number: [id number] Date of Birth/Sex: [birth year] (77 y.o. M) Treating  RN: Renne Crigler Primary Care Sumayah Bearse: [name] Other Clinician: Referring Katilin Raynes: [name] Treating Beadie Matsunaga/Extender: Linwood Dibbles, HOYT Weeks in Treatment: 3 Encounter Discharge Information Items Discharge Condition: Stable Ambulatory Status: Walker Discharge Destination: Home Transportation: Private Auto Schedule Follow-up Appointment: Yes Clinical Summary of Care: Electronic Signature(s) Signed: 01/21/2018 4:35:19 PM By: Renne Crigler Entered By: Renne Crigler on 01/18/2018 16:36:41 Brendan [name] ([id number]) -------------------------------------------------------------------------------- Lower Extremity Assessment Details Patient Name: Brendan [name] Date of Service: 01/18/2018 3:15 PM Medical Record Number: [id number] Patient Account Number: [id number] Date of Birth/Sex: [birth year] (77 y.o. M) Treating RN: Renne Crigler Primary Care Hero Mccathern: [name] Other Clinician: Referring Willa Brocks: [name] Treating Josph Norfleet/Extender: Linwood Dibbles, HOYT Weeks in Treatment: 3 Edema Assessment Assessed: [Left: No] [Right: No] Edema: [Left: Ye] [Right: s] Calf Left: Right: Point of Measurement: 35 cm From Medial Instep cm 39 cm Ankle Left: Right: Point of Measurement: 12 cm From Medial Instep cm 25 cm Vascular Assessment Claudication: Claudication Assessment [Right:None] Pulses: Dorsalis Pedis Palpable: [Right:Yes] Posterior Tibial Extremity colors, hair growth, and conditions: Extremity Color: [Right:Normal] Hair Growth on Extremity: [Right:Yes] Temperature of Extremity: [Right:Warm] Capillary Refill: [Right:< 3 seconds] Toe Nail Assessment Left: Right: Thick: No Discolored: No Deformed: No Improper Length and Hygiene: No Electronic Signature(s) Signed: 01/18/2018 3:59:43 PM By: Renne Crigler Entered By: Renne Crigler on 01/18/2018 15:47:19 Brendan [name]  ([id number]) -------------------------------------------------------------------------------- Multi Wound Chart Details Patient Name: Brendan [name] Date of Service: 01/18/2018 3:15 PM Medical Record Number: [id number] Patient Account Number: [id number] Date of Birth/Sex: 12/30/1940 (77 y.o. M) Treating RN: Renne Crigler Primary Care Shaelynn Dragos: [name] Other Clinician: Referring Jovane Foutz: [name] Treating Jaydis Duchene/Extender: Linwood Dibbles, HOYT Weeks in Treatment: 3 Vital Signs Height(in): 66 Pulse(bpm): 61 Weight(lbs): 210 Blood Pressure(mmHg): 135/57 Body Mass Index(BMI): 34 Temperature(F): 98.2 Respiratory Rate 18 (breaths/min): Photos: [N/A:N/A] Wound  Location: Right Calcaneus N/A N/A Wounding Event: Pressure Injury N/A N/A Primary Etiology: Pressure Ulcer N/A N/A Comorbid History: Anemia, Chronic Obstructive N/A N/A Pulmonary Disease (COPD), Coronary Artery Disease, Hypertension, Osteoarthritis, Neuropathy Date Acquired: 11/19/2017 N/A N/A Weeks of Treatment: 3 N/A N/A Wound Status: Open N/A N/A Measurements L x W x D 1.2x1x0.1 N/A N/A (cm) Area (cm) : 0.942 N/A N/A Volume (cm) : 0.094 N/A N/A % Reduction in Area: 76.30% N/A N/A % Reduction in Volume: 76.30% N/A N/A Classification: Category/Stage III N/A N/A Exudate Amount: Medium N/A N/A Exudate Type: Serous N/A N/A Exudate Color: amber N/A N/A Wound Margin: Flat and Intact N/A N/A Granulation Amount: Large (67-100%) N/A N/A Granulation Quality: Red, Pink N/A N/A Necrotic Amount: Small (1-33%) N/A N/A Necrotic Tissue: Eschar, Adherent Slough N/A N/A Exposed Structures: Fat Layer (Subcutaneous N/A N/A Tissue) Exposed: Yes Fascia: No Brendan [name] ([id number]) Tendon: No Muscle: No Joint: No Bone: No Epithelialization: None N/A N/A Periwound Skin Texture: Excoriation: No N/A N/A Induration: No Callus: No Crepitus: No Rash: No Scarring: No Periwound Skin Moisture: Maceration: No N/A  N/A Dry/Scaly: No Periwound Skin Color: Atrophie Blanche: No N/A N/A Cyanosis: No Ecchymosis: No Erythema: No Hemosiderin Staining: No Mottled: No Pallor: No Rubor: No Temperature: No Abnormality N/A N/A Tenderness on Palpation: Yes N/A N/A Wound Preparation: Ulcer Cleansing: N/A N/A Rinsed/Irrigated with Saline Topical Anesthetic Applied: Other: lidocaine 4% Treatment Notes Electronic Signature(s) Signed: 01/21/2018 4:35:19 PM By: Renne Crigler Entered By: Renne Crigler on 01/18/2018 16:27:42 Brendan [name] ([id number]) -------------------------------------------------------------------------------- Multi-Disciplinary Care Plan Details Patient Name: Brendan [name] Date of Service: 01/18/2018 3:15 PM Medical Record Number: [id number] Patient Account Number: [id number] Date of Birth/Sex: [birth year] (77 y.o. M) Treating RN: Renne Crigler Primary Care Kamilia Carollo: [name] Other Clinician: Referring Eshani Maestre: [name] Treating Iyanna Drummer/Extender: Linwood Dibbles, HOYT Weeks in Treatment: 3 Active Inactive ` Orientation to the Wound Care Program Nursing Diagnoses: Knowledge deficit related to the wound healing center program Goals: Patient/caregiver will verbalize understanding of the Wound Healing Center Program Date Initiated: 12/24/2017 Target Resolution Date: 01/14/2018 Goal Status: Active Interventions: Provide education on orientation to the wound center Notes: ` Wound/Skin Impairment Nursing Diagnoses: Impaired tissue integrity Goals: Patient/caregiver will verbalize understanding of skin care regimen Date Initiated: 12/24/2017 Target Resolution Date: 01/14/2018 Goal Status: Active Ulcer/skin breakdown will have a volume reduction of 30% by week 4 Date Initiated: 12/24/2017 Target Resolution Date: 01/14/2018 Goal Status: Active Interventions: Assess patient/caregiver ability to obtain necessary supplies Assess patient/caregiver ability to perform  ulcer/skin care regimen upon admission and as needed Assess ulceration(s) every visit Treatment Activities: Skin care regimen initiated : 12/24/2017 Notes: Electronic Signature(s) Signed: 01/21/2018 4:35:19 PM By: Eloisa Northern ([id number]) Entered By: Renne Crigler on 01/18/2018 16:27:36 Brendan [name] ([id number]) -------------------------------------------------------------------------------- Pain Assessment Details Patient Name: Brendan [name] Date of Service: 01/18/2018 3:15 PM Medical Record Number: [id number] Patient Account Number: [id number] Date of Birth/Sex: November 05, 1940 (77 y.o. M) Treating RN: Curtis Sites Primary Care Kataleia Quaranta: [name] Other Clinician: Referring Rayni Nemitz: [name] Treating Kemi Gell/Extender: Linwood Dibbles, HOYT Weeks in Treatment: 3 Active Problems Location of Pain Severity and Description of Pain Patient Has Paino Yes Site Locations Rate the pain. Current Pain Level: 5 Pain Management and Medication Current Pain Management: Electronic Signature(s) Signed: 01/18/2018 4:03:51 PM By: Dayton Martes RCP, RRT, CHT Signed: 01/18/2018 5:05:51 PM By: Curtis Sites Entered By: Dayton Martes on 01/18/2018 15:37:44 Brendan [name] ([id number]) -------------------------------------------------------------------------------- Patient/Caregiver Education Details Patient Name: Cory Roughen,  Landis Martins Date of Service: 01/18/2018 3:15 PM Medical Record Number: [id number] Patient Account Number: [id number] Date of Birth/Gender: 11-10-1940 (77 y.o. M) Treating RN: Renne Crigler Primary Care Physician: [name] Other Clinician: Referring Physician: [name] Treating Physician/Extender: Skeet Simmer in Treatment: 3 Education Assessment Education Provided To: Patient and Caregiver Education Topics Provided Wound/Skin Impairment: Handouts: Caring for Your Ulcer Methods:  Explain/Verbal Responses: State content correctly Electronic Signature(s) Signed: 01/21/2018 4:35:19 PM By: Renne Crigler Entered By: Renne Crigler on 01/18/2018 16:36:52 Brendan [name] ([id number]) -------------------------------------------------------------------------------- Wound Assessment Details Patient Name: Brendan [name] Date of Service: 01/18/2018 3:15 PM Medical Record Number: [id number] Patient Account Number: [id number] Date of Birth/Sex: Dec 04, 1940 (77 y.o. M) Treating RN: Renne Crigler Primary Care Lexy Meininger: [name] Other Clinician: Referring Terryl Molinelli: [name] Treating Manya Balash/Extender: Linwood Dibbles, HOYT Weeks in Treatment: 3 Wound Status Wound Number: 1 Primary Pressure Ulcer Etiology: Wound Location: Right Calcaneus Wound Open Wounding Event: Pressure Injury Status: Date Acquired: 11/19/2017 Comorbid Anemia, Chronic Obstructive Pulmonary Weeks Of Treatment: 3 History: Disease (COPD), Coronary Artery Disease, Clustered Wound: No Hypertension, Osteoarthritis, Neuropathy Photos Photo Uploaded By: Renne Crigler on 01/18/2018 15:55:06 Wound Measurements Length: (cm) 1.2 Width: (cm) 1 Depth: (cm) 0.1 Area: (cm) 0.942 Volume: (cm) 0.094 % Reduction in Area: 76.3% % Reduction in Volume: 76.3% Epithelialization: None Tunneling: No Undermining: No Wound Description Classification: Category/Stage III Wound Margin: Flat and Intact Exudate Amount: Medium Exudate Type: Serous Exudate Color: amber Foul Odor After Cleansing: No Slough/Fibrino Yes Wound Bed Granulation Amount: Large (67-100%) Exposed Structure Granulation Quality: Red, Pink Fascia Exposed: No Necrotic Amount: Small (1-33%) Fat Layer (Subcutaneous Tissue) Exposed: Yes Necrotic Quality: Eschar, Adherent Slough Tendon Exposed: No Muscle Exposed: No Joint Exposed: No Bone Exposed: No Periwound Skin Texture CAROLYN, MANISCALCO. ([id number]) Texture Color No  Abnormalities Noted: No No Abnormalities Noted: No Callus: No Atrophie Blanche: No Crepitus: No Cyanosis: No Excoriation: No Ecchymosis: No Induration: No Erythema: No Rash: No Hemosiderin Staining: No Scarring: No Mottled: No Pallor: No Moisture Rubor: No No Abnormalities Noted: No Dry / Scaly: No Temperature / Pain Maceration: No Temperature: No Abnormality Tenderness on Palpation: Yes Wound Preparation Ulcer Cleansing: Rinsed/Irrigated with Saline Topical Anesthetic Applied: Other: lidocaine 4%, Treatment Notes Wound #1 (Right Calcaneus) 1. Cleansed with: Clean wound with Normal Saline 2. Anesthetic Topical Lidocaine 4% cream to wound bed prior to debridement 4. Dressing Applied: Prisma Ag 5. Secondary Dressing Applied Bordered Foam Dressing Notes netting Electronic Signature(s) Signed: 01/18/2018 3:59:43 PM By: Renne Crigler Entered By: Renne Crigler on 01/18/2018 15:45:48 Brendan [name] ([id number]) -------------------------------------------------------------------------------- Vitals Details Patient Name: Brendan [name] Date of Service: 01/18/2018 3:15 PM Medical Record Number: [id number] Patient Account Number: [id number] Date of Birth/Sex: 04-16-1941 (77 y.o. M) Treating RN: Curtis Sites Primary Care Lynnetta Tom: [name] Other Clinician: Referring Nyanna Heideman: [name] Treating Cristopher Ciccarelli/Extender: Linwood Dibbles, HOYT Weeks in Treatment: 3 Vital Signs Time Taken: 15:38 Temperature (F): 98.2 Height (in): 66 Pulse (bpm): 61 Weight (lbs): 210 Respiratory Rate (breaths/min): 18 Body Mass Index (BMI): 33.9 Blood Pressure (mmHg): 135/57 Reference Range: 80 - 120 mg / dl Electronic Signature(s) Signed: 01/18/2018 4:03:51 PM By: Dayton Martes RCP, RRT, CHT Entered By: Dayton Martes on 01/18/2018 15:40:37

## 2018-01-24 NOTE — [place] [facility] (Signed)
DOV, DILL ([id number]) Visit Report for 01/18/2018 Chief Complaint Document Details Patient Name: Brendan [name], Brendan [name] Date of Service: 01/18/2018 3:15 PM Medical Record Number: [id number] Patient Account Number: [id number] Date of Birth/Sex: 1941-01-13 (77 y.o. M) Treating RN: Curtis Sites Primary Care Provider: [name] Other Clinician: Referring Provider: [name] Treating Provider/Extender: Linwood Dibbles, Alan Drummer Weeks in Treatment: 3 Information Obtained from: Patient Chief Complaint Right heel ulcer Electronic Signature(s) Signed: 01/21/2018 1:32:29 PM By: Lenda Kelp PA-C Entered By: Lenda Kelp on 01/18/2018 16:26:16 Brendan [name] ([id number]) -------------------------------------------------------------------------------- HPI Details Patient Name: Brendan [name] Date of Service: 01/18/2018 3:15 PM Medical Record Number: [id number] Patient Account Number: [id number] Date of Birth/Sex: Jun 28, 1940 (77 y.o. M) Treating RN: Curtis Sites Primary Care Provider: [name] Other Clinician: Referring Provider: [name] Treating Provider/Extender: Linwood Dibbles, Marcille Barman Weeks in Treatment: 3 History of Present Illness HPI Description: 12/24/17 on evaluation today patient's is seen for initial evaluation concerning issues he's been having with his right heel for one month. According to the patient and his wife he sustained a fall which led to this injury. With that being said the appearance of the wound as well as the location of the wound seems to indicate more a pressure type injury in my pinion. Nonetheless whether pressure or otherwise abrasion the patient does have depth to subcutaneous tissue there is some Slough noted as well. He fortunately has no significant discomfort although he does have peripheral vascular disease as evidenced by the x-ray which was performed and showed no acute bone abnormality but there was purple vascular calcification noted. He has  had a previous heart attack with bypass surgery subsequent and has a current ejection fraction of 42 for the 5% on the last check. He does have a history of hypertension as well as COPD as well. The patient states overall things seem to be doing a little bit better but nonetheless they did want to come in to be checked out. Unfortunately they could not get into [place] in a sufficient amount of time they live in [place] therefore they are coming to [place] to see me here. I do believe we may want to send for vascular testing in that case they would like to go to [facility] for that. His ABI's performed here in the office were noncompressible at 1.54 left in 1.46 right. No fevers, chills, nausea, or vomiting noted at this time. Patient does have a brain stimulator due to trimmers that he was experiencing. According to the patient's wife this is MRI compatible according to their physician. They just have to turn off the stimulation. 01/04/18 on evaluation today patient is seen for reevaluation concerning his right heel ulcer. He has had the arterial studies with ABI and TBI performed and this was very good. The right ABI was 1.55 with a TBI of 1.03 the left ABI was 1.28 with a TBI of 0.95. The patient's wound does have some necrotic tissue still overlying the surface of the wound. Fortunately there does not appear to be any evidence of infection at this time. 01/11/18 on evaluation today patient actually appears to be doing fairly well in regard to his heel ulcer. He has been tolerating the dressing changes without complication. Fortunately there's no evidence of infection. He does have some Slough on the surface of the wound. 01/18/18 on evaluation today patient actually appears to be doing very well in regard to his heel ulcer. This impact is showing signs of excellent improvement at this  time. There does not appear to be the evidence of infection which is great news. Overall I'm very  pleased the wound is about half the size it was at the last evaluation. Electronic Signature(s) Signed: 01/21/2018 1:32:29 PM By: Lenda Kelp PA-C Entered By: Lenda Kelp on 01/18/2018 16:38:12 Brendan [name] ([id number]) -------------------------------------------------------------------------------- Physical Exam Details Patient Name: Brendan [name] Date of Service: 01/18/2018 3:15 PM Medical Record Number: [id number] Patient Account Number: [id number] Date of Birth/Sex: 1940/10/17 (77 y.o. M) Treating RN: Curtis Sites Primary Care Provider: [name] Other Clinician: Referring Provider: [name] Treating Provider/Extender: STONE III, Lenae Wherley Weeks in Treatment: 3 Constitutional Well-nourished and well-hydrated in no acute distress. Respiratory normal breathing without difficulty. clear to auscultation bilaterally. Cardiovascular regular rate and rhythm with normal S1, S2. Psychiatric this patient is able to make decisions and demonstrates good insight into disease process. Alert and Oriented x 3. pleasant and cooperative. Notes Currently patient's wound bed shows great granulation there's no need for sharp debridement today. This was discussed with the patient as well. Overall I'm extremely pleased with how things have gone with this ulcer. Electronic Signature(s) Signed: 01/21/2018 1:32:29 PM By: Lenda Kelp PA-C Entered By: Lenda Kelp on 01/18/2018 16:38:50 Brendan [name] ([id number]) -------------------------------------------------------------------------------- Physician Orders Details Patient Name: Brendan [name] Date of Service: 01/18/2018 3:15 PM Medical Record Number: [id number] Patient Account Number: [id number] Date of Birth/Sex: [birth year] (77 y.o. M) Treating RN: Renne Crigler Primary Care Provider: [name] Other Clinician: Referring Provider: [name] Treating Provider/Extender: Linwood Dibbles, Zariana Strub Weeks in Treatment:  3 Verbal / Phone Orders: No Diagnosis Coding ICD-10 Coding Code Description L89.613 Pressure ulcer of right heel, stage 3 I10 Essential (primary) hypertension I25.810 Atherosclerosis of coronary artery bypass graft(s) without angina pectoris Z96.89 Presence of other specified functional implants Wound Cleansing Wound #1 Right Calcaneus o Clean wound with Normal Saline. Anesthetic (add to Medication List) o Topical Lidocaine 4% cream applied to wound bed prior to debridement (In Clinic Only). Skin Barriers/Peri-Wound Care Wound #1 Right Calcaneus o Skin Prep Primary Wound Dressing Wound #1 Right Calcaneus o Silver Collagen Secondary Dressing Wound #1 Right Calcaneus o Boardered Foam Dressing Dressing Change Frequency Wound #1 Right Calcaneus o Change dressing every other day. Follow-up Appointments Wound #1 Right Calcaneus o Return Appointment in 2 weeks. Electronic Signature(s) Signed: 01/21/2018 1:32:29 PM By: Lenda Kelp PA-C Signed: 01/21/2018 4:35:19 PM By: Renne Crigler Entered By: Renne Crigler on 01/18/2018 16:29:01 Brendan [name] ([id number]) -------------------------------------------------------------------------------- Problem List Details Patient Name: Brendan [name] Date of Service: 01/18/2018 3:15 PM Medical Record Number: [id number] Patient Account Number: [id number] Date of Birth/Sex: [birth year] (77 y.o. M) Treating RN: Curtis Sites Primary Care Provider: [name] Other Clinician: Referring Provider: [name] Treating Provider/Extender: Linwood Dibbles, Basha Krygier Weeks in Treatment: 3 Active Problems ICD-10 Evaluated Encounter Code Description Active Date Today Diagnosis L89.613 Pressure ulcer of right heel, stage 3 12/24/2017 No Yes I10 Essential (primary) hypertension 12/24/2017 No Yes I25.810 Atherosclerosis of coronary artery bypass graft(s) without 12/24/2017 No Yes angina pectoris Z96.89 Presence of other specified  functional implants 12/24/2017 No Yes Inactive Problems Resolved Problems Electronic Signature(s) Signed: 01/21/2018 1:32:29 PM By: Lenda Kelp PA-C Entered By: Lenda Kelp on 01/18/2018 16:26:09 Brendan [name] ([id number]) -------------------------------------------------------------------------------- [place] Note Details Patient Name: Brendan [name] Date of Service: 01/18/2018 3:15 PM Medical Record Number: [id number] Patient Account Number: [id number] Date of Birth/Sex: 1940/06/06 (77 y.o. M) Treating RN: Curtis Sites Primary Care Provider: Nelson Chimes,  Murray Hodgkins Other Clinician: Referring Provider: [name] Treating Provider/Extender: Linwood Dibbles, Liley Rake Weeks in Treatment: 3 Subjective Chief Complaint Information obtained from Patient Right heel ulcer History of Present Illness (HPI) 12/24/17 on evaluation today patient's is seen for initial evaluation concerning issues he's been having with his right heel for one month. According to the patient and his wife he sustained a fall which led to this injury. With that being said the appearance of the wound as well as the location of the wound seems to indicate more a pressure type injury in my pinion. Nonetheless whether pressure or otherwise abrasion the patient does have depth to subcutaneous tissue there is some Slough noted as well. He fortunately has no significant discomfort although he does have peripheral vascular disease as evidenced by the x-ray which was performed and showed no acute bone abnormality but there was purple vascular calcification noted. He has had a previous heart attack with bypass surgery subsequent and has a current ejection fraction of 42 for the 5% on the last check. He does have a history of hypertension as well as COPD as well. The patient states overall things seem to be doing a little bit better but nonetheless they did want to come in to be checked out. Unfortunately they could not get into  [place] in a sufficient amount of time they live in [place] therefore they are coming to [place] to see me here. I do believe we may want to send for vascular testing in that case they would like to go to [facility] for that. His ABI's performed here in the office were noncompressible at 1.54 left in 1.46 right. No fevers, chills, nausea, or vomiting noted at this time. Patient does have a brain stimulator due to trimmers that he was experiencing. According to the patient's wife this is MRI compatible according to their physician. They just have to turn off the stimulation. 01/04/18 on evaluation today patient is seen for reevaluation concerning his right heel ulcer. He has had the arterial studies with ABI and TBI performed and this was very good. The right ABI was 1.55 with a TBI of 1.03 the left ABI was 1.28 with a TBI of 0.95. The patient's wound does have some necrotic tissue still overlying the surface of the wound. Fortunately there does not appear to be any evidence of infection at this time. 01/11/18 on evaluation today patient actually appears to be doing fairly well in regard to his heel ulcer. He has been tolerating the dressing changes without complication. Fortunately there's no evidence of infection. He does have some Slough on the surface of the wound. 01/18/18 on evaluation today patient actually appears to be doing very well in regard to his heel ulcer. This impact is showing signs of excellent improvement at this time. There does not appear to be the evidence of infection which is great news. Overall I'm very pleased the wound is about half the size it was at the last evaluation. Patient History Information obtained from Patient. Family History Heart Disease - Siblings, Hypertension - Siblings, No family history of Cancer, Diabetes, Hereditary Spherocytosis, Kidney Disease, Lung Disease, Seizures, Stroke, Thyroid Problems, Tuberculosis. Social History Never  smoker, Marital Status - Married, Alcohol Use - Never, Drug Use - No History, Caffeine Use - Rarely. Medical And Surgical History Notes Brendan [name], Brendan [name] ([id number]) Cardiovascular HLD Genitourinary self cath twice daily r/t retention Neurologic 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, 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 Review of Systems (ROS)  Constitutional Symptoms (General Health) Denies complaints or symptoms of Fever, Chills. Respiratory The patient has no complaints or symptoms. Cardiovascular The patient has no complaints or symptoms. Psychiatric The patient has no complaints or symptoms. Objective Constitutional Well-nourished and well-hydrated in no acute distress. Vitals Time Taken: 3:38 PM, Height: 66 in, Weight: 210 lbs, BMI: 33.9, Temperature: 98.2 F, Pulse: 61 bpm, Respiratory Rate: 18 breaths/min, Blood Pressure: 135/57 mmHg. Respiratory normal breathing without difficulty. clear to auscultation bilaterally. Cardiovascular regular rate and rhythm with normal S1, S2. Psychiatric this patient is able to make decisions and demonstrates good insight into disease process. Alert and Oriented x 3. pleasant and cooperative. General Notes: Currently patient's wound bed shows great granulation there's no need for sharp debridement today. This was discussed with the patient as well. Overall I'm extremely pleased with how things have gone with this ulcer. Integumentary (Hair, Skin) Wound #1 status is Open. Original cause of wound was Pressure Injury. The wound is located on the Right Calcaneus. The wound measures 1.2cm length x 1cm width x 0.1cm depth; 0.942cm^2 area and 0.094cm^3 volume. There is Fat Layer (Subcutaneous Tissue) Exposed exposed. There is no tunneling or undermining noted. There is a medium amount of serous drainage noted. The wound margin is flat and intact. There is large (67-100%) red, pink granulation within the wound bed. There is a small (1-33%) amount of  necrotic tissue within the wound bed including Eschar and Adherent Slough. The periwound skin appearance did not exhibit: Callus, Crepitus, Excoriation, Induration, Rash, Scarring, Dry/Scaly, Maceration, Atrophie Blanche, Cyanosis, Ecchymosis, Hemosiderin Staining, Mottled, Pallor, Rubor, Erythema. Periwound temperature was noted as No Abnormality. The periwound has tenderness on palpation. Brendan [name] ([id number]) Assessment Active Problems ICD-10 Pressure ulcer of right heel, stage 3 Essential (primary) hypertension Atherosclerosis of coronary artery bypass graft(s) without angina pectoris Presence of other specified functional implants Plan Wound Cleansing: Wound #1 Right Calcaneus: Clean wound with Normal Saline. Anesthetic (add to Medication List): Topical Lidocaine 4% cream applied to wound bed prior to debridement (In Clinic Only). Skin Barriers/Peri-Wound Care: Wound #1 Right Calcaneus: Skin Prep Primary Wound Dressing: Wound #1 Right Calcaneus: Silver Collagen Secondary Dressing: Wound #1 Right Calcaneus: Boardered Foam Dressing Dressing Change Frequency: Wound #1 Right Calcaneus: Change dressing every other day. Follow-up Appointments: Wound #1 Right Calcaneus: Return Appointment in 2 weeks. I'm going to suggest currently that we continue with the above wound care measures for the next week. The patient is in agreement with the plan. We will subtly see were things stand at follow-up. Please see above for specific wound care orders. We will see patient for re-evaluation in 2 week(s) here in the clinic. If anything worsens or changes patient will contact our office for additional recommendations. Electronic Signature(s) Signed: 01/21/2018 1:32:29 PM By: Lenda Kelp PA-C Entered By: Lenda Kelp on 01/18/2018 16:39:20 Brendan [name] ([id number]) -------------------------------------------------------------------------------- ROS/PFSH Details Patient  Name: Brendan [name] Date of Service: 01/18/2018 3:15 PM Medical Record Number: [id number] Patient Account Number: [id number] Date of Birth/Sex: [birth year] (77 y.o. M) Treating RN: Curtis Sites Primary Care Provider: [name] Other Clinician: Referring Provider: [name] Treating Provider/Extender: Linwood Dibbles, Stellarose Cerny Weeks in Treatment: 3 Information Obtained From Patient Wound History Do you currently have one or more open woundso Yes How many open wounds do you currently haveo 1 Approximately how long have you had your woundso 1 month How have you been treating your wound(s) until nowo neosporin Has your wound(s) ever healed and then re-openedo No  Have you had any lab work done in the past montho Yes Who ordered the lab work [employer] Have you tested positive for an antibiotic resistant organism (MRSA, VRE)o No Have you tested positive for osteomyelitis (bone infection)o No Have you had any tests for circulation on your legso No Have you had other problems associated with your woundso Swelling Constitutional Symptoms (General Health) Complaints and Symptoms: Negative for: Fever; Chills Eyes Medical History: Negative for: Cataracts; Glaucoma; Optic Neuritis Ear/Nose/Mouth/Throat Medical History: Negative for: Chronic sinus problems/congestion; Middle ear problems Hematologic/Lymphatic Medical History: Positive for: Anemia Negative for: Hemophilia; Human Immunodeficiency Virus; Lymphedema; Sickle Cell Disease Respiratory Complaints and Symptoms: No Complaints or Symptoms Medical History: Positive for: Chronic Obstructive Pulmonary Disease (COPD) Negative for: Aspiration; Asthma; Pneumothorax; Sleep Apnea; Tuberculosis Cardiovascular Complaints and Symptoms: No Complaints or Symptoms Brendan [name], Brendan [name] ([id number]) Medical History: Positive for: Coronary Artery Disease; Hypertension Past Medical History Notes: HLD Gastrointestinal Medical History: Negative  for: Cirrhosis ; Colitis; Crohnos; Hepatitis A; Hepatitis B; Hepatitis C Endocrine Medical History: Negative for: Type I Diabetes; Type II Diabetes Genitourinary Medical History: Negative for: End Stage Renal Disease Past Medical History Notes: self cath twice daily r/t retention Immunological Medical History: Negative for: Lupus Erythematosus; Raynaudos; Scleroderma Integumentary (Skin) Medical History: Negative for: History of Burn; History of pressure wounds Musculoskeletal Medical History: Positive for: Osteoarthritis Negative for: Gout; Rheumatoid Arthritis; Osteomyelitis Neurologic Medical History: Positive for: Neuropathy Negative for: Dementia; Quadriplegia; Paraplegia; Seizure Disorder Past Medical History Notes: 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, 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 Oncologic Medical History: Negative for: Received Chemotherapy; Received Radiation Psychiatric Complaints and Symptoms: No Complaints or Symptoms Medical History: Negative for: Anorexia/bulimia; Confinement Anxiety Brendan [name], Brendan [name] ([id number]) Immunizations Pneumococcal Vaccine: Received Pneumococcal Vaccination: Yes Implantable Devices Family and Social History Cancer: No; Diabetes: No; Heart Disease: Yes - Siblings; Hereditary Spherocytosis: No; Hypertension: Yes - Siblings; Kidney Disease: No; Lung Disease: No; Seizures: No; Stroke: No; Thyroid Problems: No; Tuberculosis: No; Never smoker; Marital Status - Married; Alcohol Use: Never; Drug Use: No History; Caffeine Use: Rarely; Financial Concerns: No; Food, Clothing or Shelter Needs: No; Support System Lacking: No; Transportation Concerns: No; Advanced Directives: No; Patient does not want information on Advanced Directives Physician Affirmation I have reviewed and agree with the above information. Electronic Signature(s) Signed: 01/18/2018 5:05:51 PM By: Curtis Sites Signed: 01/21/2018 1:32:29 PM By: Lenda Kelp  PA-C Entered By: Lenda Kelp on 01/18/2018 16:38:26 Brendan [name] ([id number]) -------------------------------------------------------------------------------- SuperBill Details Patient Name: Brendan [name] Date of Service: 01/18/2018 Medical Record Number: [id number] Patient Account Number: [id number] Date of Birth/Sex: 08/23/1940 (77 y.o. M) Treating RN: Curtis Sites Primary Care Provider: [name] Other Clinician: Referring Provider: [name] Treating Provider/Extender: Linwood Dibbles, Zarif Rathje Weeks in Treatment: 3 Diagnosis Coding ICD-10 Codes Code Description [phone] Pressure ulcer of right heel, stage 3 I10 Essential (primary) hypertension I25.810 Atherosclerosis of coronary artery bypass graft(s) without angina pectoris Z96.89 Presence of other specified functional implants Facility Procedures CPT4 Code: [id number] Description: 02725 - WOUND CARE VISIT-LEV 2 EST PT Modifier: Quantity: 1 Physician Procedures CPT4 Code: [id number] Description: 99213 - WC PHYS LEVEL 3 - EST PT ICD-10 Diagnosis Description L89.613 Pressure ulcer of right heel, stage 3 I10 Essential (primary) hypertension I25.810 Atherosclerosis of coronary artery bypass graft(s) without a Z96.89 Presence of other  specified functional implants Modifier: ngina pectoris Quantity: 1 Electronic Signature(s) Signed: 01/21/2018 1:32:29 PM By: Lenda Kelp PA-C Entered By: Lenda Kelp on 01/18/2018 16:39:34

## 2018-02-01 ENCOUNTER — Ambulatory Visit: Payer: PPO | Admitting: Physician Assistant

## 2018-02-04 DIAGNOSIS — L89893 Pressure ulcer of other site, stage 3: Secondary | ICD-10-CM | POA: Diagnosis not present

## 2018-02-06 DIAGNOSIS — L89893 Pressure ulcer of other site, stage 3: Secondary | ICD-10-CM | POA: Diagnosis not present

## 2018-02-08 ENCOUNTER — Encounter: Payer: PPO | Admitting: Physician Assistant

## 2018-02-08 DIAGNOSIS — L89619 Pressure ulcer of right heel, unspecified stage: Secondary | ICD-10-CM | POA: Diagnosis not present

## 2018-02-08 DIAGNOSIS — L89613 Pressure ulcer of right heel, stage 3: Secondary | ICD-10-CM | POA: Diagnosis not present

## 2018-02-10 NOTE — Progress Notes (Signed)
JOTHAM, AHN ([id number]) Visit Report for 02/08/2018 Chief Complaint Document Details Patient Name: RONNEL, ZUERCHER Date of Service: 02/08/2018 12:30 PM Medical Record Number: [id number] Patient Account Number: [id number] Date of Birth/Sex: 1940/10/21 (77 y.o. M) Treating RN: Curtis Sites Primary Care Provider: [name] Other Clinician: Referring Provider: [name] Treating Provider/Extender: Linwood Dibbles, Diontay Rosencrans Weeks in Treatment: 6 Information Obtained from: Patient Chief Complaint Right heel ulcer Electronic Signature(s) Signed: 02/08/2018 6:09:48 PM By: Lenda Kelp PA-C Entered By: Lenda Kelp on 02/08/2018 12:58:05 [name] ([id number]) -------------------------------------------------------------------------------- HPI Details Patient Name: [name] Date of Service: 02/08/2018 12:30 PM Medical Record Number: [id number] Patient Account Number: [id number] Date of Birth/Sex: [birth year] (77 y.o. M) Treating RN: Curtis Sites Primary Care Provider: [name] Other Clinician: Referring Provider: [name] Treating Provider/Extender: Linwood Dibbles, Emaya Preston Weeks in Treatment: 6 History of Present Illness HPI Description: 12/24/17 on evaluation today patient's is seen for initial evaluation concerning issues he's been having with his right heel for one month. According to the patient and his wife he sustained a fall which led to this injury. With that being said the appearance of the wound as well as the location of the wound seems to indicate more a pressure type injury in my pinion. Nonetheless whether pressure or otherwise abrasion the patient does have depth to subcutaneous tissue there is some Slough noted as well. He fortunately has no significant discomfort although he does have peripheral vascular disease as evidenced by the x-ray which was performed and showed no acute bone abnormality but there was purple vascular calcification noted. He  has had a previous heart attack with bypass surgery subsequent and has a current ejection fraction of 42 for the 5% on the last check. He does have a history of hypertension as well as COPD as well. The patient states overall things seem to be doing a little bit better but nonetheless they did want to come in to be checked out. Unfortunately they could not get into [place] in a sufficient amount of time they live in [place] therefore they are coming to [place] to see me here. I do believe we may want to send for vascular testing in that case they would like to go to [facility] for that. His ABI's performed here in the office were noncompressible at 1.54 left in 1.46 right. No fevers, chills, nausea, or vomiting noted at this time. Patient does have a brain stimulator due to trimmers that he was experiencing. According to the patient's wife this is MRI compatible according to their physician. They just have to turn off the stimulation. 01/04/18 on evaluation today patient is seen for reevaluation concerning his right heel ulcer. He has had the arterial studies with ABI and TBI performed and this was very good. The right ABI was 1.55 with a TBI of 1.03 the left ABI was 1.28 with a TBI of 0.95. The patient's wound does have some necrotic tissue still overlying the surface of the wound. Fortunately there does not appear to be any evidence of infection at this time. 01/11/18 on evaluation today patient actually appears to be doing fairly well in regard to his heel ulcer. He has been tolerating the dressing changes without complication. Fortunately there's no evidence of infection. He does have some Slough on the surface of the wound. 01/18/18 on evaluation today patient actually appears to be doing very well in regard to his heel ulcer. This impact is showing signs of excellent improvement at this  time. There does not appear to be the evidence of infection which is great news. Overall I'm very  pleased the wound is about half the size it was at the last evaluation. 02/08/18 On inspection today patient's wound bed actually shows evidence of being completely healed at this time which is excellent news. There does not appear to be evidence of infection which is also great news. Overall I think he has done excellent with the Current wound care measures and shows complete epithelization. Electronic Signature(s) Signed: 02/08/2018 6:09:48 PM By: Lenda Kelp PA-C Entered By: Lenda Kelp on 02/08/2018 13:35:00 [name] ([id number]) -------------------------------------------------------------------------------- Physical Exam Details Patient Name: [name] Date of Service: 02/08/2018 12:30 PM Medical Record Number: [id number] Patient Account Number: [id number] Date of Birth/Sex: 1940-09-25 (77 y.o. M) Treating RN: Curtis Sites Primary Care Provider: [name] Other Clinician: Referring Provider: [name] Treating Provider/Extender: STONE III, Khyler Urda Weeks in Treatment: 6 Constitutional Well-nourished and well-hydrated in no acute distress. Respiratory normal breathing without difficulty. Psychiatric this patient is able to make decisions and demonstrates good insight into disease process. Alert and Oriented x 3. pleasant and cooperative. Notes Wound is completely healed. Electronic Signature(s) Signed: 02/08/2018 6:09:48 PM By: Lenda Kelp PA-C Entered By: Lenda Kelp on 02/08/2018 13:35:35 [name] ([id number]) -------------------------------------------------------------------------------- Physician Orders Details Patient Name: [name] Date of Service: 02/08/2018 12:30 PM Medical Record Number: [id number] Patient Account Number: [id number] Date of Birth/Sex: Oct 31, 1940 (77 y.o. M) Treating RN: Curtis Sites Primary Care Provider: [name] Other Clinician: Referring Provider: [name] Treating  Provider/Extender: Linwood Dibbles, Cable Fearn Weeks in Treatment: 6 Verbal / Phone Orders: No Diagnosis Coding ICD-10 Coding Code Description L89.613 Pressure ulcer of right heel, stage 3 I10 Essential (primary) hypertension I25.810 Atherosclerosis of coronary artery bypass graft(s) without angina pectoris Z96.89 Presence of other specified functional implants Discharge From [facility] o Discharge from Wound Care Center Electronic Signature(s) Signed: 02/08/2018 5:35:44 PM By: Curtis Sites Signed: 02/08/2018 6:09:48 PM By: Lenda Kelp PA-C Entered By: Curtis Sites on 02/08/2018 13:02:03 [name] ([id number]) -------------------------------------------------------------------------------- Problem List Details Patient Name: [name] Date of Service: 02/08/2018 12:30 PM Medical Record Number: [id number] Patient Account Number: [id number] Date of Birth/Sex: 09/09/1940 (77 y.o. M) Treating RN: Curtis Sites Primary Care Provider: [name] Other Clinician: Referring Provider: [name] Treating Provider/Extender: Linwood Dibbles, Kearra Calkin Weeks in Treatment: 6 Active Problems ICD-10 Evaluated Encounter Code Description Active Date Today Diagnosis L89.613 Pressure ulcer of right heel, stage 3 12/24/2017 No Yes I10 Essential (primary) hypertension 12/24/2017 No Yes I25.810 Atherosclerosis of coronary artery bypass graft(s) without 12/24/2017 No Yes angina pectoris Z96.89 Presence of other specified functional implants 12/24/2017 No Yes Inactive Problems Resolved Problems Electronic Signature(s) Signed: 02/08/2018 6:09:48 PM By: Lenda Kelp PA-C Entered By: Lenda Kelp on 02/08/2018 12:57:55 [name] ([id number]) -------------------------------------------------------------------------------- Progress Note Details Patient Name: [name] Date of Service: 02/08/2018 12:30 PM Medical Record Number: [id number] Patient Account Number: [id number] Date  of Birth/Sex: [birth year] (77 y.o. M) Treating RN: Curtis Sites Primary Care Provider: [name] Other Clinician: Referring Provider: [name] Treating Provider/Extender: Linwood Dibbles, Aliyana Dlugosz Weeks in Treatment: 6 Subjective Chief Complaint Information obtained from Patient Right heel ulcer History of Present Illness (HPI) 12/24/17 on evaluation today patient's is seen for initial evaluation concerning issues he's been having with his right heel for one month. According to the patient and his wife he sustained a fall which led to this  injury. With that being said the appearance of the wound as well as the location of the wound seems to indicate more a pressure type injury in my pinion. Nonetheless whether pressure or otherwise abrasion the patient does have depth to subcutaneous tissue there is some Slough noted as well. He fortunately has no significant discomfort although he does have peripheral vascular disease as evidenced by the x-ray which was performed and showed no acute bone abnormality but there was purple vascular calcification noted. He has had a previous heart attack with bypass surgery subsequent and has a current ejection fraction of 42 for the 5% on the last check. He does have a history of hypertension as well as COPD as well. The patient states overall things seem to be doing a little bit better but nonetheless they did want to come in to be checked out. Unfortunately they could not get into [place] in a sufficient amount of time they live in [place] therefore they are coming to [place] to see me here. I do believe we may want to send for vascular testing in that case they would like to go to [facility] for that. His ABI's performed here in the office were noncompressible at 1.54 left in 1.46 right. No fevers, chills, nausea, or vomiting noted at this time. Patient does have a brain stimulator due to trimmers that he was experiencing. According to the patient's  wife this is MRI compatible according to their physician. They just have to turn off the stimulation. 01/04/18 on evaluation today patient is seen for reevaluation concerning his right heel ulcer. He has had the arterial studies with ABI and TBI performed and this was very good. The right ABI was 1.55 with a TBI of 1.03 the left ABI was 1.28 with a TBI of 0.95. The patient's wound does have some necrotic tissue still overlying the surface of the wound. Fortunately there does not appear to be any evidence of infection at this time. 01/11/18 on evaluation today patient actually appears to be doing fairly well in regard to his heel ulcer. He has been tolerating the dressing changes without complication. Fortunately there's no evidence of infection. He does have some Slough on the surface of the wound. 01/18/18 on evaluation today patient actually appears to be doing very well in regard to his heel ulcer. This impact is showing signs of excellent improvement at this time. There does not appear to be the evidence of infection which is great news. Overall I'm very pleased the wound is about half the size it was at the last evaluation. 02/08/18 On inspection today patient's wound bed actually shows evidence of being completely healed at this time which is excellent news. There does not appear to be evidence of infection which is also great news. Overall I think he has done excellent with the Current wound care measures and shows complete epithelization. Patient History Information obtained from Patient. Family History Heart Disease - Siblings, Hypertension - Siblings, No family history of Cancer, Diabetes, Hereditary Spherocytosis, Kidney Disease, Lung Disease, Seizures, Stroke, Thyroid Problems, Tuberculosis. [name] ([id number]) Social History Never smoker, Marital Status - Married, Alcohol Use - Never, Drug Use - No History, Caffeine Use - Rarely. Medical And Surgical History  Notes Cardiovascular HLD Genitourinary self cath twice daily r/t retention Neurologic 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, 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 Review of Systems (ROS) Constitutional Symptoms (General Health) Denies complaints or symptoms of Fever, Chills. Respiratory The patient has no  complaints or symptoms. Cardiovascular The patient has no complaints or symptoms. Psychiatric The patient has no complaints or symptoms. Objective Constitutional Well-nourished and well-hydrated in no acute distress. Vitals Time Taken: 12:50 PM, Height: 66 in, Weight: 210 lbs, BMI: 33.9, Temperature: 97.5 F, Pulse: 55 bpm, Respiratory Rate: 16 breaths/min, Blood Pressure: 134/55 mmHg. Respiratory normal breathing without difficulty. Psychiatric this patient is able to make decisions and demonstrates good insight into disease process. Alert and Oriented x 3. pleasant and cooperative. General Notes: Wound is completely healed. Integumentary (Hair, Skin) Wound #1 status is Open. Original cause of wound was Pressure Injury. The wound is located on the Right Calcaneus. The wound measures 0cm length x 0cm width x 0cm depth; 0cm^2 area and 0cm^3 volume. Assessment TARYLL, REICHENBERGER ([id number]) Active Problems ICD-10 Pressure ulcer of right heel, stage 3 Essential (primary) hypertension Atherosclerosis of coronary artery bypass graft(s) without angina pectoris Presence of other specified functional implants Plan Discharge From [facility] Services: Discharge from Wound Care Center At this point I'm gonna recommend that we discontinue wound care services as the patient is completely healed. I did recommend that he where a protective bandage over the area for the next week in order to ensure that nothing reopens. The patient and his wife are in agreement with the plan. I'm very pleased that he has done so well. Electronic Signature(s) Signed: 02/08/2018 6:09:48 PM By: Lenda Kelp  PA-C Entered By: Lenda Kelp on 02/08/2018 13:52:51 [name] ([id number]) -------------------------------------------------------------------------------- ROS/PFSH Details Patient Name: [name] Date of Service: 02/08/2018 12:30 PM Medical Record Number: [id number] Patient Account Number: [id number] Date of Birth/Sex: Nov 26, 1940 (77 y.o. M) Treating RN: Curtis Sites Primary Care Provider: [name] Other Clinician: Referring Provider: [name] Treating Provider/Extender: Linwood Dibbles, Terris Bodin Weeks in Treatment: 6 Information Obtained From Patient Wound History Do you currently have one or more open woundso Yes How many open wounds do you currently haveo 1 Approximately how long have you had your woundso 1 month How have you been treating your wound(s) until nowo neosporin Has your wound(s) ever healed and then re-openedo No Have you had any lab work done in the past montho Yes Who ordered the lab work doneo [facility] Have you tested positive for an antibiotic resistant organism (MRSA, VRE)o No Have you tested positive for osteomyelitis (bone infection)o No Have you had any tests for circulation on your legso No Have you had other problems associated with your woundso Swelling Constitutional Symptoms (General Health) Complaints and Symptoms: Negative for: Fever; Chills Eyes Medical History: Negative for: Cataracts; Glaucoma; Optic Neuritis Ear/Nose/Mouth/Throat Medical History: Negative for: Chronic sinus problems/congestion; Middle ear problems Hematologic/Lymphatic Medical History: Positive for: Anemia Negative for: Hemophilia; Human Immunodeficiency Virus; Lymphedema; Sickle Cell Disease Respiratory Complaints and Symptoms: No Complaints or Symptoms Medical History: Positive for: Chronic Obstructive Pulmonary Disease (COPD) Negative for: Aspiration; Asthma; Pneumothorax; Sleep Apnea; Tuberculosis Cardiovascular Complaints and Symptoms: No  Complaints or Symptoms KAMAURY, CUTBIRTH ([id number]) Medical History: Positive for: Coronary Artery Disease; Hypertension Past Medical History Notes: HLD Gastrointestinal Medical History: Negative for: Cirrhosis ; Colitis; Crohnos; Hepatitis A; Hepatitis B; Hepatitis C Endocrine Medical History: Negative for: Type I Diabetes; Type II Diabetes Genitourinary Medical History: Negative for: End Stage Renal Disease Past Medical History Notes: self cath twice daily r/t retention Immunological Medical History: Negative for: Lupus Erythematosus; Raynaudos; Scleroderma Integumentary (Skin) Medical History: Negative for: History of Burn; History of pressure wounds Musculoskeletal Medical History: Positive for: Osteoarthritis Negative for: Gout; Rheumatoid Arthritis;  Osteomyelitis Neurologic Medical History: Positive for: Neuropathy Negative for: Dementia; Quadriplegia; Paraplegia; Seizure Disorder Past Medical History Notes: 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, 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 Oncologic Medical History: Negative for: Received Chemotherapy; Received Radiation Psychiatric Complaints and Symptoms: No Complaints or Symptoms Medical History: Negative for: Anorexia/bulimia; Confinement Anxiety BENTON, TOOKER ([id number]) Immunizations Pneumococcal Vaccine: Received Pneumococcal Vaccination: Yes Implantable Devices Family and Social History Cancer: No; Diabetes: No; Heart Disease: Yes - Siblings; Hereditary Spherocytosis: No; Hypertension: Yes - Siblings; Kidney Disease: No; Lung Disease: No; Seizures: No; Stroke: No; Thyroid Problems: No; Tuberculosis: No; Never smoker; Marital Status - Married; Alcohol Use: Never; Drug Use: No History; Caffeine Use: Rarely; Financial Concerns: No; Food, Clothing or Shelter Needs: No; Support System Lacking: No; Transportation Concerns: No; Advanced Directives: No; Patient does not want information on Advanced  Directives Physician Affirmation I have reviewed and agree with the above information. Electronic Signature(s) Signed: 02/08/2018 5:35:44 PM By: Curtis Sites Signed: 02/08/2018 6:09:48 PM By: Lenda Kelp PA-C Entered By: Lenda Kelp on 02/08/2018 13:35:17 [name] ([id number]) -------------------------------------------------------------------------------- SuperBill Details Patient Name: [name] Date of Service: 02/08/2018 Medical Record Number: [id number] Patient Account Number: [id number] Date of Birth/Sex: Aug 30, 1940 (77 y.o. M) Treating RN: Curtis Sites Primary Care Provider: [name] Other Clinician: Referring Provider: [name] Treating Provider/Extender: Linwood Dibbles, Mantaj Chamberlin Weeks in Treatment: 6 Diagnosis Coding ICD-10 Codes Code Description [phone] Pressure ulcer of right heel, stage 3 I10 Essential (primary) hypertension I25.810 Atherosclerosis of coronary artery bypass graft(s) without angina pectoris Z96.89 Presence of other specified functional implants Facility Procedures CPT4 Code: [id number] Description: 43329 - WOUND CARE VISIT-LEV 2 EST PT Modifier: Quantity: 1 Physician Procedures CPT4 Code: [id number] Description: 60630 - WC PHYS LEVEL 2 - EST PT ICD-10 Diagnosis Description L89.613 Pressure ulcer of right heel, stage 3 I10 Essential (primary) hypertension I25.810 Atherosclerosis of coronary artery bypass graft(s) without a Z96.89 Presence of other  specified functional implants Modifier: ngina pectoris Quantity: 1 Electronic Signature(s) Signed: 02/08/2018 6:09:48 PM By: Lenda Kelp PA-C Entered By: Lenda Kelp on 02/08/2018 13:54:21

## 2018-02-10 NOTE — Progress Notes (Signed)
RACE, LATOUR ([id number]) Visit Report for 02/08/2018 Arrival Information Details Patient Name: Brendan [name], Brendan [name] Date of Service: 02/08/2018 12:30 PM Medical Record Number: [id number] Patient Account Number: [id number] Date of Birth/Sex: [birth year] (77 y.o. M) Treating RN: Curtis Sites Primary Care Brayah Urquilla: [name] Other Clinician: Referring Ladarious Kresse: [name] Treating Ormond Lazo/Extender: Linwood Dibbles, HOYT Weeks in Treatment: 6 Visit Information History Since Last Visit Added or deleted any medications: No Patient Arrived: Walker Any new allergies or adverse reactions: No Arrival Time: 12:49 Had a fall or experienced change in No Accompanied By: spouse activities of daily living that may affect Transfer Assistance: None risk of falls: Patient Identification Verified: Yes Signs or symptoms of abuse/neglect since last visito No Secondary Verification Process Yes Hospitalized since last visit: No Completed: Implantable device outside of the clinic excluding No Patient Has Alerts: Yes cellular tissue based products placed in the center Patient Alerts: ABI 01/02/18 L 1.28 R since last visit: 1.55 Has Dressing in Place as Prescribed: Yes TBI L 1.03 R 0.95 Pain Present Now: No Electronic Signature(s) Signed: 02/08/2018 5:35:44 PM By: Curtis Sites Entered By: Curtis Sites on 02/08/2018 12:50:02 Brendan [name] ([id number]) -------------------------------------------------------------------------------- Clinic Level of Care Assessment Details Patient Name: Brendan [name] Date of Service: 02/08/2018 12:30 PM Medical Record Number: [id number] Patient Account Number: [id number] Date of Birth/Sex: [birth year] (77 y.o. M) Treating RN: Curtis Sites Primary Care Timmothy Baranowski: [name] Other Clinician: Referring Kennia Vanvorst: [name] Treating Roselee Tayloe/Extender: Linwood Dibbles, HOYT Weeks in Treatment: 6 Clinic Level of Care Assessment Items TOOL 4 Quantity Score []   - Use when only an EandM is performed on FOLLOW-UP visit 0 ASSESSMENTS - Nursing Assessment / Reassessment X - Reassessment of Co-morbidities (includes updates in patient status) 1 10 X- 1 5 Reassessment of Adherence to Treatment Plan ASSESSMENTS - Wound and Skin Assessment / Reassessment X - Simple Wound Assessment / Reassessment - one wound 1 5 []  - 0 Complex Wound Assessment / Reassessment - multiple wounds []  - 0 Dermatologic / Skin Assessment (not related to wound area) ASSESSMENTS - Focused Assessment []  - Circumferential Edema Measurements - multi extremities 0 []  - 0 Nutritional Assessment / Counseling / Intervention X- 1 5 Lower Extremity Assessment (monofilament, tuning fork, pulses) []  - 0 Peripheral Arterial Disease Assessment (using hand held doppler) ASSESSMENTS - Ostomy and/or Continence Assessment and Care []  - Incontinence Assessment and Management 0 []  - 0 Ostomy Care Assessment and Management (repouching, etc.) PROCESS - Coordination of Care X - Simple Patient / Family Education for ongoing care 1 15 []  - 0 Complex (extensive) Patient / Family Education for ongoing care []  - 0 Staff obtains Chiropractor, Records, Test Results / Process Orders []  - 0 Staff telephones HHA, Nursing Homes / Clarify orders / etc []  - 0 Routine Transfer to another Facility (non-emergent condition) []  - 0 Routine Hospital Admission (non-emergent condition) []  - 0 New Admissions / Manufacturing engineer / Ordering NPWT, Apligraf, etc. []  - 0 Emergency Hospital Admission (emergent condition) X- 1 10 Simple Discharge Coordination PERRI, LAMAGNA ([id number]) []  - 0 Complex (extensive) Discharge Coordination PROCESS - Special Needs []  - Pediatric / Minor Patient Management 0 []  - 0 Isolation Patient Management []  - 0 Hearing / Language / Visual special needs []  - 0 Assessment of Community assistance (transportation, D/C planning, etc.) []  - 0 Additional assistance / Altered  mentation []  - 0 Support Surface(s) Assessment (bed, cushion, seat, etc.) INTERVENTIONS - Wound Cleansing / Measurement X - Simple Wound Cleansing - one  wound 1 5 []  - 0 Complex Wound Cleansing - multiple wounds X- 1 5 Wound Imaging (photographs - any number of wounds) []  - 0 Wound Tracing (instead of photographs) X- 1 5 Simple Wound Measurement - one wound []  - 0 Complex Wound Measurement - multiple wounds INTERVENTIONS - Wound Dressings []  - Small Wound Dressing one or multiple wounds 0 []  - 0 Medium Wound Dressing one or multiple wounds []  - 0 Large Wound Dressing one or multiple wounds []  - 0 Application of Medications - topical []  - 0 Application of Medications - injection INTERVENTIONS - Miscellaneous []  - External ear exam 0 []  - 0 Specimen Collection (cultures, biopsies, blood, body fluids, etc.) []  - 0 Specimen(s) / Culture(s) sent or taken to Lab for analysis []  - 0 Patient Transfer (multiple staff / Nurse, adult / Similar devices) []  - 0 Simple Staple / Suture removal (25 or less) []  - 0 Complex Staple / Suture removal (26 or more) []  - 0 Hypo / Hyperglycemic Management (close monitor of Blood Glucose) []  - 0 Ankle / Brachial Index (ABI) - do not check if billed separately X- 1 5 Vital Signs HAYWARD, RYLANDER ([id number]) Has the patient been seen at the hospital within the last three years: Yes Total Score: 70 Level Of Care: New/Established - Level 2 Electronic Signature(s) Signed: 02/08/2018 5:35:44 PM By: Curtis Sites Entered By: Curtis Sites on 02/08/2018 13:02:23 Brendan [name] ([id number]) -------------------------------------------------------------------------------- Encounter Discharge Information Details Patient Name: Brendan [name] Date of Service: 02/08/2018 12:30 PM Medical Record Number: [id number] Patient Account Number: [id number] Date of Birth/Sex: 09-18-1940 (77 y.o. M) Treating RN: Curtis Sites Primary Care Kalie Cabral: [name] Other Clinician: Referring Morris Longenecker: [name] Treating Armando Bukhari/Extender: Linwood Dibbles, HOYT Weeks in Treatment: 6 Encounter Discharge Information Items Discharge Condition: Stable Ambulatory Status: Walker Discharge Destination: Home Transportation: Private Auto Accompanied By: spouse Schedule Follow-up Appointment: No Clinical Summary of Care: Electronic Signature(s) Signed: 02/08/2018 5:35:44 PM By: Curtis Sites Entered By: Curtis Sites on 02/08/2018 13:02:42 Brendan [name] ([id number]) -------------------------------------------------------------------------------- Lower Extremity Assessment Details Patient Name: Brendan [name] Date of Service: 02/08/2018 12:30 PM Medical Record Number: [id number] Patient Account Number: [id number] Date of Birth/Sex: [birth year] (77 y.o. M) Treating RN: Curtis Sites Primary Care Lindsee Labarre: [name] Other Clinician: Referring Zakari Bathe: [name] Treating Graziella Connery/Extender: Linwood Dibbles, HOYT Weeks in Treatment: 6 Vascular Assessment Pulses: Dorsalis Pedis Palpable: [Right:Yes] Posterior Tibial Extremity colors, hair growth, and conditions: Extremity Color: [Right:Normal] Hair Growth on Extremity: [Right:Yes] Temperature of Extremity: [Right:Warm] Capillary Refill: [Right:< 3 seconds] Toe Nail Assessment Left: Right: Thick: Yes Discolored: Yes Deformed: No Improper Length and Hygiene: No Electronic Signature(s) Signed: 02/08/2018 5:35:44 PM By: Curtis Sites Entered By: Curtis Sites on 02/08/2018 12:55:13 Brendan [name] ([id number]) -------------------------------------------------------------------------------- Multi-Disciplinary Care Plan Details Patient Name: Brendan [name] Date of Service: 02/08/2018 12:30 PM Medical Record Number: [id number] Patient Account Number: [id number] Date of Birth/Sex: 10-22-1940 (77 y.o. M) Treating RN: Curtis Sites Primary Care Adrien Dietzman: [name] Other  Clinician: Referring Tiny Rietz: [name] Treating Dhillon Comunale/Extender: Linwood Dibbles, HOYT Weeks in Treatment: 6 Active Inactive Electronic Signature(s) Signed: 02/08/2018 5:35:44 PM By: Curtis Sites Entered By: Curtis Sites on 02/08/2018 13:01:43 Brendan [name] ([id number]) -------------------------------------------------------------------------------- Pain Assessment Details Patient Name: Brendan [name] Date of Service: 02/08/2018 12:30 PM Medical Record Number: [id number] Patient Account Number: [id number] Date of Birth/Sex: 1940-04-28 (77 y.o. M) Treating RN: Curtis Sites Primary Care Lamika Connolly: [name] Other Clinician: Referring Thinh Cuccaro: [name] Treating Elis Rawlinson/Extender: Linwood Dibbles,  HOYT Weeks in Treatment: 6 Active Problems Location of Pain Severity and Description of Pain Patient Has Paino No Site Locations Pain Management and Medication Current Pain Management: Electronic Signature(s) Signed: 02/08/2018 5:35:44 PM By: Curtis Sites Entered By: Curtis Sites on 02/08/2018 12:50:08 Brendan [name] ([id number]) -------------------------------------------------------------------------------- Patient/Caregiver Education Details Patient Name: Brendan [name] Date of Service: 02/08/2018 12:30 PM Medical Record Number: [id number] Patient Account Number: [id number] Date of Birth/Gender: February 08, 1941 (77 y.o. M) Treating RN: Curtis Sites Primary Care Physician: [name] Other Clinician: Referring Physician: [name] Treating Physician/Extender: Skeet Simmer in Treatment: 6 Education Assessment Education Provided To: Patient and Caregiver Education Topics Provided Basic Hygiene: Handouts: Other: care of newly healed ulcer site Methods: Explain/Verbal Responses: State content correctly Electronic Signature(s) Signed: 02/08/2018 5:35:44 PM By: Curtis Sites Entered By: Curtis Sites on 02/08/2018 13:03:00 Brendan [name]  ([id number]) -------------------------------------------------------------------------------- Wound Assessment Details Patient Name: Brendan [name] Date of Service: 02/08/2018 12:30 PM Medical Record Number: [id number] Patient Account Number: [id number] Date of Birth/Sex: 06-24-1940 (77 y.o. M) Treating RN: Curtis Sites Primary Care Keawe Marcello: [name] Other Clinician: Referring Ruairi Stutsman: [name] Treating Anique Beckley/Extender: Linwood Dibbles, HOYT Weeks in Treatment: 6 Wound Status Wound Number: 1 Primary Etiology: Pressure Ulcer Wound Location: Right Calcaneus Wound Status: Open Wounding Event: Pressure Injury Date Acquired: 11/19/2017 Weeks Of Treatment: 6 Clustered Wound: No Wound Measurements Length: (cm) 0 % Width: (cm) 0 % Depth: (cm) 0 Area: (cm) 0 Volume: (cm) 0 Reduction in Area: 100% Reduction in Volume: 100% Wound Description Classification: Category/Stage III Periwound Skin Texture Texture Color No Abnormalities Noted: No No Abnormalities Noted: No Moisture No Abnormalities Noted: No Electronic Signature(s) Signed: 02/08/2018 5:35:44 PM By: Curtis Sites Entered By: Curtis Sites on 02/08/2018 12:54:56 Brendan [name] ([id number]) -------------------------------------------------------------------------------- Vitals Details Patient Name: Brendan [name] Date of Service: 02/08/2018 12:30 PM Medical Record Number: [id number] Patient Account Number: [id number] Date of Birth/Sex: [birth year] (77 y.o. M) Treating RN: Curtis Sites Primary Care Ashyia Schraeder: [name] Other Clinician: Referring Tarahji Ramthun: [name] Treating Jie Stickels/Extender: Linwood Dibbles, HOYT Weeks in Treatment: 6 Vital Signs Time Taken: 12:50 Temperature (F): 97.5 Height (in): 66 Pulse (bpm): 55 Weight (lbs): 210 Respiratory Rate (breaths/min): 16 Body Mass Index (BMI): 33.9 Blood Pressure (mmHg): 134/55 Reference Range: 80 - 120 mg / dl Electronic Signature(s) Signed:  02/08/2018 5:35:44 PM By: Curtis Sites Entered By: Curtis Sites on 02/08/2018 12:52:27

## 2018-02-12 DIAGNOSIS — I619 Nontraumatic intracerebral hemorrhage, unspecified: Secondary | ICD-10-CM | POA: Diagnosis not present

## 2018-02-12 DIAGNOSIS — R339 Retention of urine, unspecified: Secondary | ICD-10-CM | POA: Diagnosis not present

## 2018-02-12 DIAGNOSIS — G8110 Spastic hemiplegia affecting unspecified side: Secondary | ICD-10-CM | POA: Diagnosis not present

## 2018-02-27 ENCOUNTER — Other Ambulatory Visit: Payer: Self-pay | Admitting: Family Medicine

## 2018-03-04 DIAGNOSIS — R3912 Poor urinary stream: Secondary | ICD-10-CM | POA: Diagnosis not present

## 2018-03-04 DIAGNOSIS — N401 Enlarged prostate with lower urinary tract symptoms: Secondary | ICD-10-CM | POA: Diagnosis not present

## 2018-03-05 ENCOUNTER — Ambulatory Visit (INDEPENDENT_AMBULATORY_CARE_PROVIDER_SITE_OTHER): Payer: PPO | Admitting: Family Medicine

## 2018-03-05 DIAGNOSIS — L219 Seborrheic dermatitis, unspecified: Secondary | ICD-10-CM

## 2018-03-05 MED ORDER — NITROGLYCERIN 0.4 MG SL SUBL: 0.4000 mg | SUBLINGUAL_TABLET | SUBLINGUAL | 3 refills | Status: DC | PRN

## 2018-03-05 MED ORDER — TRAZODONE HCL 50 MG PO TABS: 50.0000 mg | ORAL_TABLET | Freq: Every day | ORAL | 1 refills | Status: DC

## 2018-03-05 MED ORDER — KETOCONAZOLE 2 % EX SHAM: 1.0000 "application " | MEDICATED_SHAMPOO | CUTANEOUS | 0 refills | Status: DC

## 2018-03-05 MED ORDER — TRAMADOL HCL 50 MG PO TABS: 50.0000 mg | ORAL_TABLET | Freq: Four times a day (QID) | ORAL | 1 refills | Status: DC | PRN

## 2018-03-05 MED ORDER — METHOCARBAMOL 750 MG PO TABS: ORAL_TABLET | ORAL | 2 refills | Status: DC

## 2018-03-05 MED ORDER — FLUTICASONE PROPIONATE 50 MCG/ACT NA SUSP: 2.0000 | Freq: Every day | NASAL | 2 refills | Status: DC

## 2018-03-05 NOTE — Progress Notes (Addendum)
   Subjective:   Patient ID: Brendan [name]    DOB: 03/31/1941, 77 y.o. male   MRN: [id number]  CC: seborrheic dermatitis, medication refill  HPI: Brendan BoJerry W Ruberg is a 77 y.o. male who presents to clinic today for the following issues.  Seborrheic dermatitis Patient present with his wife who states he has been having some flaking skin of his scalp. The skin flakes are larger than normal dandruff and sometimes involve eyebrow area bilaterally.  The area sometimes gets a little red but no itching or irritation noted.  No open lesions or breaks in the skin.  He has been using Selsun blue for this which helps a little.  No new lotions, detergents or bath products.  Only known allergy is lisinopril.  No other concerns at this time.   ROS: Denies fevers, chills, nausea, vomiting.  No abdominal pain, shortness of breath.   Social: Patient is a former smoker, quit 1970 Medications reviewed. Objective:   BP 135/60   Pulse 61   Temp (!) 97.5 F (36.4 C) (Oral)   Wt 205 lb 9.6 oz (93.3 kg)   SpO2 96%   BMI 33.18 kg/m  Vitals and nursing note reviewed.  General: 77 year old male, NAD HEENT: NCAT, EOMI, PERRLA, MMM Neck: supple CV: RRR no MRG  Lungs: CTAB, normal effort  Abdomen: soft, NTND, +bs  Skin: warm, dry, flaking skin of scalp consistent with seborrheic dermatitis noted over scalp, no annular appearance or central clearing Extremities: warm and well perfused  Assessment & Plan:   Seborrheic dermatitis of scalp Scalp flaking consistent with seborrheic dermatitis.  No eyebrow or facial involvement noted on exam however wife reports he does sometimes have this.  Advised to use of ketoconazole shampoo in addition to [facility] Encouraged patient to follow up and let me know if this is not working we may trial something else  Meds ordered this encounter  Medications  . traMADol (ULTRAM) 50 MG tablet    Sig: Take 1 tablet (50 mg total) by mouth every 6 (six) hours as needed for  moderate pain.    Dispense:  90 tablet    Refill:  1  . traZODone (DESYREL) 50 MG tablet    Sig: Take 1 tablet (50 mg total) by mouth at bedtime.    Dispense:  30 tablet    Refill:  1  . nitroGLYCERIN (NITROSTAT) 0.4 MG SL tablet    Sig: Place 1 tablet (0.4 mg total) under the tongue every 5 (five) minutes as needed for chest pain. call 911 if chest pain not better    Dispense:  30 tablet    Refill:  3  . methocarbamol (ROBAXIN) 750 MG tablet    Sig: TAKE 1 TABLET BY MOUTH TWICE DAILY AS NEEDED FOR MUSCLE SPASMS    Dispense:  30 tablet    Refill:  2  . ketoconazole (NIZORAL) 2 % shampoo    Sig: Apply 1 application topically once a week.    Dispense:  120 mL    Refill:  0  . fluticasone (FLONASE) 50 MCG/ACT nasal spray    Sig: Place 2 sprays into both nostrils daily.    Dispense:  16 g    Refill:  2   Freddrick MarchYashika Latesia Norrington, MD [facility]

## 2018-03-05 NOTE — Patient Instructions (Signed)
It was nice seeing you again today.  We reviewed your medication list and I have refilled your prescriptions.  Additionally, we discussed your dry flaking scalp which is likely seborrheic dermatitis.  I would recommend continuing the ketoconazole shampoo in addition to [facility].  If you feel this is not improving, please call clinic and I we will try sending something else in.   Be well, Freddrick MarchYashika Donis Pinder MD

## 2018-03-11 NOTE — Assessment & Plan Note (Signed)
Scalp flaking consistent with seborrheic dermatitis.  No eyebrow or facial involvement noted on exam however wife reports he does sometimes have this.  Advised to use of ketoconazole shampoo in addition to [facility] Encouraged patient to follow up and let me know if this is not working we may trial something else

## 2018-03-13 ENCOUNTER — Telehealth: Payer: Self-pay | Admitting: *Deleted

## 2018-03-13 NOTE — Telephone Encounter (Signed)
[facility] Losartan is on backorder and they are requesting an alternative. Casy Brunetto, Maryjo RochesterJessica Dawn, CMA

## 2018-03-18 ENCOUNTER — Other Ambulatory Visit: Payer: Self-pay | Admitting: Family Medicine

## 2018-03-18 MED ORDER — CANDESARTAN CILEXETIL 32 MG PO TABS: 32.0000 mg | ORAL_TABLET | Freq: Every day | ORAL | 1 refills | Status: DC

## 2018-03-18 NOTE — Telephone Encounter (Signed)
Called pharmacy and have switched him to candesartan 32 mg daily for now.   Pt will be informed once ready for pick up.

## 2018-03-23 DIAGNOSIS — R339 Retention of urine, unspecified: Secondary | ICD-10-CM | POA: Diagnosis not present

## 2018-03-23 DIAGNOSIS — G8110 Spastic hemiplegia affecting unspecified side: Secondary | ICD-10-CM | POA: Diagnosis not present

## 2018-03-23 DIAGNOSIS — I619 Nontraumatic intracerebral hemorrhage, unspecified: Secondary | ICD-10-CM | POA: Diagnosis not present

## 2018-03-27 ENCOUNTER — Telehealth: Payer: Self-pay | Admitting: Neurology

## 2018-03-27 NOTE — Telephone Encounter (Signed)
He will likely need to turn it off the day he has it done for a proper reading.  He can turn it off and back on the day he has it done

## 2018-03-27 NOTE — Telephone Encounter (Signed)
Does the DBS need turned off for an EKG?

## 2018-03-27 NOTE — Telephone Encounter (Signed)
Spoke with patient's wife and walked her through turning off DBS. She will call with any questions/problems.

## 2018-03-27 NOTE — Telephone Encounter (Signed)
Patient's wife called and would like you top please call he regarding Brendan [name]'s DBS and how to turn on and off. She said he is having an EKG next week. Please Call. Thanks

## 2018-04-01 DIAGNOSIS — I10 Essential (primary) hypertension: Secondary | ICD-10-CM | POA: Diagnosis not present

## 2018-04-01 DIAGNOSIS — G250 Essential tremor: Secondary | ICD-10-CM | POA: Diagnosis not present

## 2018-04-01 DIAGNOSIS — Z6832 Body mass index (BMI) 32.0-32.9, adult: Secondary | ICD-10-CM | POA: Diagnosis not present

## 2018-04-03 ENCOUNTER — Encounter: Payer: Self-pay | Admitting: Cardiology

## 2018-04-03 ENCOUNTER — Ambulatory Visit (INDEPENDENT_AMBULATORY_CARE_PROVIDER_SITE_OTHER): Payer: PPO | Admitting: Cardiology

## 2018-04-03 VITALS — BP 130/70 | HR 62 | Ht 66.0 in | Wt 199.0 lb

## 2018-04-03 DIAGNOSIS — I25119 Atherosclerotic heart disease of native coronary artery with unspecified angina pectoris: Secondary | ICD-10-CM | POA: Diagnosis not present

## 2018-04-03 DIAGNOSIS — R600 Localized edema: Secondary | ICD-10-CM | POA: Diagnosis not present

## 2018-04-03 DIAGNOSIS — I10 Essential (primary) hypertension: Secondary | ICD-10-CM

## 2018-04-03 DIAGNOSIS — E785 Hyperlipidemia, unspecified: Secondary | ICD-10-CM | POA: Diagnosis not present

## 2018-04-03 DIAGNOSIS — I25708 Atherosclerosis of coronary artery bypass graft(s), unspecified, with other forms of angina pectoris: Secondary | ICD-10-CM

## 2018-04-03 NOTE — Progress Notes (Signed)
PCP: [name], MD  NeuroSgx: Dr. [name] Neurologist: Dr. Arbutus Leas  Clinic Note: Chief Complaint  Patient presents with  . Follow-up    Overall doing much better.  . Coronary Artery Disease    No angina or heart failure    HPI: Brendan [name] is a 77 y.o. male with a CAD PMH noted below who presents today for stable 40-month follow-up.  Brendan [name] is a long-term patient of Dr. Julieanne Manson, who then began following up with me in 2013-2014 timeframe. He is a long-standing history of CAD s/p CABG, HLD, HTN, GERD  CAD -CABGx3 in 1997.   Unstable Angina in June 2013 - Occluded SVG-Diag w/Patent LIMA-LAD & SVG-OM treated medically. He does have chronic stable angina.   He is on Inderal as BB to help Rx tremors along with CAD.   Last Myoview 09/2015: EF 55-65%. No ischemia or infarction noted. LOW RISK   He has now completed a 3 stage process of deep brain stimulator installation.  Most recent procedure was having the batteries hooked up.  He now has 2 battery packs along both sides of his chest.  I saw him 6 months ago having had his final stage of surgery.  At that time he remained quite debilitated from his Parkinson's.  Not noticing any chest pain pressure.  Recent Hospitalizations:  none since last visit.  Studies Personally Reviewed - (if available, images/films reviewed: From Epic Chart or Care Everywhere)  12/2017: ABIs-right lower extremity arteries are noncompressible but triphasic waveforms.  Relatively normal left side.  12/2017: Right lower extremity Doppler shows concentric calcification in the right lower extremity.  No obstructive disease.  Interval History: Brendan [name] returns today actually walking in with a walker, but with much less prominent tremor.  It only started showing near the end of the visit when he was started to get little bit tired.  His wife actually thinks that she may have not switched the stimulator back on after the EKG. He is quite pleased with  the results of the brain stimulator.  He says that he really denies any cardiac symptoms.  He says every now and then he will wake up little bit short of breath, but denies any real or orthopnea or PND symptoms.  His leg swelling is pretty stable and minimal.  He is not able to do much and that he is still walks with a walker.  With the amount of activity he is able to, he denies any exertional chest pain or pressure.  He does get short of breath if he walks for too long, but this is if anything better than it was 6 months ago. He denies any prolonged rapid irregular heartbeats or palpitations.  He clearly every now and then feels some skipped beats, but can sometimes be confused with the generators in his chest causing discomfort or quivering.  He still has to do in and out catheterization.  Otherwise he denies any hematuria.  He denies any melena or hematochezia.   ROS: A comprehensive was performed. Review of Systems  Constitutional: Positive for malaise/fatigue (Actually seems to be getting back to more stable baseline less fatigue.). Negative for chills and fever.  HENT: Positive for congestion. Negative for nosebleeds.   Respiratory: Positive for cough. Negative for sputum production, shortness of breath and wheezing.        Recovering from a cold that was at his peak about 2 weeks ago.  Minimal coughing now.  Just a little congestion  Cardiovascular: Positive for leg swelling (Relatively controlled).  Gastrointestinal: Negative for abdominal pain, constipation, heartburn and vomiting.  Genitourinary: Positive for dysuria (As a result of recent UTI.  Now cleared up). Negative for flank pain, frequency and hematuria.       Still has to do I&O cath  Musculoskeletal: Positive for joint pain.  Neurological: Positive for dizziness (Positional only now.), tremors (Much better with brain stimulator), weakness (Generalized weakness improving) and headaches (Still has off-and-on headaches, but did  not mention them today without asking.).  Psychiatric/Behavioral: Positive for memory loss. The patient is not nervous/anxious and does not have insomnia.   All other systems reviewed and are negative.  I have reviewed and (if needed) personally updated the patient's problem list, medications, allergies, past medical and surgical history, social and family history.   Past Medical History:  Diagnosis Date  . Anxiety disorder   . Benign enlargement of prostate   . CAD in native artery 1997   Referred for CABG x3 in 2004 for LAD diagonal bifurcation lesion  . Cervical spinal stenosis   . Complex regional pain syndrome of right upper extremity    Right wrist; L arm  . Coronary atherosclerosis of artery bypass graft June 2013   Occluded SVG-D1; Cardiologist Dr. Herbie Baltimore  . Gait disorder   . Gastroesophageal reflux disease   . Glaucoma   . Hiatal hernia    GI: Dr Evette Cristal  . Hyperlipidemia LDL goal <70   . Hypertension   . Memory loss    Mild  . Obesity   . Peripheral neuropathy    possible peripheral neuropathy  . S/P CABG x 3 2004    LIMA-LAD, SVG to diagonal, SVG to OM  . Tremor, essential    On Primidone  . Unstable angina pectoris [facility])  June 2013   Cardiac cath: occluded SVG-DI. Patent LIMA-LAD and SVG-OM. EF 45% with apical inferior HK.    Past Surgical History:  Procedure Laterality Date  . CARDIAC CATHETERIZATION    . CATARACT EXTRACTION, BILATERAL    . CORONARY ARTERY BYPASS GRAFT  06/2002   LIMA-LAD, SVG-D1, SVG-OM  . EYE SURGERY    . KNEE SURGERY Left   . LEFT HEART CATHETERIZATION WITH CORONARY/GRAFT ANGIOGRAM N/A 10/13/2011   Procedure: LEFT HEART CATHETERIZATION WITH Isabel Caprice;  Surgeon: Marykay Lex, MD;  Location: [facility] CATH LAB;  Service: Cardiovascular;  Laterality: N/A;  . MINOR PLACEMENT OF FIDUCIAL N/A 04/26/2017   Procedure: Fiducial placement;  Surgeon: Maeola Harman, MD;  Location[facility] OR;  Service: Neurosurgery;  Laterality: N/A;  Fiducial  placement  . NM MYOCAR PERF WALL MOTION  03/23/2009   protocol:Bruce, normal perfusion in all regions, post-stress EF 72%, exercise capacity , EKG negative for ischemia.  [name] PULSE GENERATOR IMPLANT Bilateral 11/13/2017   Procedure: Bilateral Implantable pulse generator;  Surgeon: Maeola Harman, MD;  Location: [facility] OR;  Service: Neurosurgery;  Laterality: Bilateral;  Bilateral Implantable pulse generator  . Shoulder Orthoscopic Surgery   06/2003  . SUBTHALAMIC STIMULATOR INSERTION Bilateral 05/04/2017   Procedure: Bilateral Deep brain stimulator placement;  Surgeon: Maeola Harman, MD;  Location: [facility] OR;  Service: Neurosurgery;  Laterality: Bilateral;  Bilateral deep brain stimulator placement  . TRANSTHORACIC ECHOCARDIOGRAM  04/2017   Jan 2019: Normal LV size and function.  EF 55-60%.  No or W MA.  Mild LA dilation.  . wrist ganglion cyst Left     Current Meds  Medication Sig  . ALLERGY RELIEF 10 MG tablet TAKE 1  TABLET BY MOUTH DAILY  . aspirin EC 81 MG tablet Take 81 mg by mouth daily.  [name]. atorvastatin (LIPITOR) 40 MG tablet TAKE 1 TABLET BY MOUTH DAILY  . candesartan (ATACAND) 32 MG tablet Take 1 tablet (32 mg total) by mouth daily.  . fluticasone (FLONASE) 50 MCG/ACT nasal spray Place 2 sprays into both nostrils daily.  . furosemide (LASIX) 20 MG tablet TAKE 1 TABLET BY MOUTH DAILY, TAKE AN EXTRA DOSE IF NEEDED  . gabapentin (NEURONTIN) 300 MG capsule Take 1 capsule (300 mg total) by mouth at bedtime.  [name]. ketoconazole (NIZORAL) 2 % shampoo Apply 1 application topically once a week.  . Melatonin 3 MG TABS Take 1 tablet (3 mg total) by mouth at bedtime.  . methocarbamol (ROBAXIN) 750 MG tablet TAKE 1 TABLET BY MOUTH TWICE DAILY AS NEEDED FOR MUSCLE SPASMS  . nitroGLYCERIN (NITROSTAT) 0.4 MG SL tablet Place 1 tablet (0.4 mg total) under the tongue every 5 (five) minutes as needed for chest pain. call 911 if chest pain not better  . omeprazole (PRILOSEC) 20 MG capsule Take 1 capsule (20 mg  total) by mouth daily as needed (acid reflux).  [name]. PROAIR HFA 108 (90 Base) MCG/ACT inhaler Take 2 puffs by mouth every 6 (six) hours as needed for wheezing or shortness of breath.   . propranolol (INDERAL) 40 MG tablet Take 1 tablet (40 mg total) by mouth 2 (two) times daily.  [name]. Propylene Glycol (SYSTANE BALANCE) 0.6 % SOLN Place 1 drop into both eyes daily as needed (for dry eyes).  . tamsulosin (FLOMAX) 0.4 MG CAPS capsule Take 1 capsule (0.4 mg total) by mouth daily after supper.  . traMADol (ULTRAM) 50 MG tablet Take 1 tablet (50 mg total) by mouth every 6 (six) hours as needed for moderate pain.  . traZODone (DESYREL) 50 MG tablet Take 1 tablet (50 mg total) by mouth at bedtime.    Allergies  Allergen Reactions  . Lisinopril Cough    Social History   Tobacco Use  . Smoking status: Former Smoker    Packs/day: 3.00    Years: 20.00    Pack years: 60.00    Types: Cigarettes    Last attempt to quit: 04/17/1968    Years since quitting: 50.0  . Smokeless tobacco: Never Used  Substance Use Topics  . Alcohol use: No    Frequency: Never  . Drug use: No   Social History   Social History Narrative   Married.  Wife is Royal PiedraMary Shrader.   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   Former smoker, quit 20 years ago. Does not drink alcohol.   Does not get routine exercise, walks sometimes with a cane.   Caffeine use: diet-soda   Left handed     family history includes Drug abuse in his daughter; Heart disease in his brother, brother, and mother; Hepatitis C in his daughter; Tremor in his paternal uncle.  Wt Readings from Last 3 Encounters:  04/03/18 199 lb (90.3 kg)  03/05/18 205 lb 9.6 oz (93.3 kg)  01/07/18 211 lb (95.7 kg)    PHYSICAL EXAM BP 130/70   Pulse 62   Ht 5\' 6"  (1.676 m)   Wt 199 lb (90.3 kg)   BMI 32.12 kg/m  Physical Exam  Constitutional: He is oriented to person, place, and time. He appears well-developed and well-nourished. No distress.  Chronically  ill-appearing, but much proved compared to last visit.  Walking with a walker, but much less prominent tremor.  Walked in faster and more stable  HENT:  Head: Normocephalic and atraumatic.  Mouth/Throat: No oropharyngeal exudate.  His hair is starting to grow back, but he has well healing suture lines on his head.  Neck: No hepatojugular reflux and no JVD present. Carotid bruit is not present. No tracheal deviation present. No thyromegaly present.  Cardiovascular: Normal rate, regular rhythm, S1 normal, S2 normal, normal heart sounds and intact distal pulses.  No extrasystoles are present. PMI is not displaced. Exam reveals no gallop and no friction rub.  No murmur heard. Pulmonary/Chest: Effort normal and breath sounds normal. No respiratory distress. He has no wheezes. He has no rales. He exhibits no tenderness.  He has generators on both sides of the chest wall.  More medial than a pacemaker or defibrillator would be.  Abdominal: Soft. Bowel sounds are normal. He exhibits no distension. There is no abdominal tenderness.  Musculoskeletal: Normal range of motion.        General: Edema (Roughly 1+ right and trace left lower extremity edema.) present.  Neurological: He is alert and oriented to person, place, and time.  Skin: He is not diaphoretic.  Psychiatric: He has a normal mood and affect. His behavior is normal. Judgment and thought content normal.  Still a bit slow with responses, but better than last visit  Vitals reviewed.   Adult ECG Report N/a  Other studies Reviewed: Additional studies/ records that were reviewed today include:  Recent Labs:   Lab Results  Component Value Date   CREATININE 1.20 11/05/2017   BUN 12 11/05/2017   NA 138 11/05/2017   K 4.5 11/05/2017   CL 102 11/05/2017   CO2 26 11/05/2017   Lab Results  Component Value Date   CHOL 115 05/16/2017   HDL 29 (L) 05/16/2017   LDLCALC 73 05/16/2017   LDLDIRECT 84 08/30/2006   TRIG 64 05/16/2017   CHOLHDL 4.0  05/16/2017   Lab Results  Component Value Date   WBC 6.1 11/05/2017   HGB 11.3 (L) 11/05/2017   HCT 36.1 (L) 11/05/2017   MCV 98.6 11/05/2017   PLT 160 11/05/2017   Lab Results  Component Value Date   HGBA1C 5.8 (H) 05/16/2017    ASSESSMENT / PLAN: Problem List Items Addressed This Visit    Atherosclerosis of native coronary artery of native heart with angina pectoris (HCC) (Chronic)    I reviewed with CAD he has class I stable angina.  Remains on moderate dose losartan as well as propranolol as a beta-blocker.  Has not had to use nitroglycerin now in greater than 9 months.      Relevant Orders   Comprehensive metabolic panel   Atherosclerotic heart disease of artery bypass graft - Primary (Chronic)    2 of 3 grafts patent with 1 occluded.  Minimal anginal symptoms if any. We talked in the past about doing a stress test, however at this point, I think he is not having active symptoms, would probably not consider stress test. Remains stable on aspirin and statin beta-blocker and ARB.  No changes.      Relevant Orders   Comprehensive metabolic panel   Essential hypertension (Chronic)    Stable on current dose of losartan and propranolol.      Hyperlipidemia LDL goal <70 (Chronic)    LDL from last January looks pretty good.  He remains on atorvastatin with better lipid control since converting from simvastatin. Happily, we are now pretty close to goal as he would not be  insulin taking any additional medications.  Continue to monitor no myalgias.      Relevant Orders   Lipid panel   Comprehensive metabolic panel   Lower extremity edema (Chronic)    Mild unilateral edema.  Still taking his Lasix just about every day.  Not taking additional doses though.  I suspect there is a neurologic component and do recommend support stockings.         Current medicines are reviewed at length with the patient today. (+/- concerns) n/a The following changes have been made: see  below   Patient Instructions  Medication Instructions:  Not needed If you need a refill on your cardiac medications before your next appointment, please call your pharmacy.   Lab work: LIPID CMP- DO NOT EAT OR DRINK THE MORNING OF THE TEST  ASK PRIMARY ARE THERE ANY OTHER LABS  If you have labs (blood work) drawn today and your tests are completely normal, you will receive your results only by: [name] MyChart Message (if you have MyChart) OR . A paper copy in the mail If you have any lab test that is abnormal or we need to change your treatment, we will call you to review the results.  Testing/Procedures: Not needed   Follow-Up: At [facility], you and your health needs are our priority.  As part of our continuing mission to provide you with exceptional heart care, we have created designated Provider Care Teams.  These Care Teams include your primary Cardiologist (physician) and Advanced Practice Providers (APPs -  Physician Assistants and Nurse Practitioners) who all work together to provide you with the care you need, when you need it. You will need a follow up appointment in 12 months Dec 2020.  Please call our office 2 months in advance to schedule this appointment.  You may see Bryan Lemma, MD or one of the following Advanced Practice Providers on your designated Care Team:   Theodore Demark, PA-C . Joni Reining, DNP, ANP  Any Other Special Instructions Will Be Listed Below (If Applicable).     Studies Ordered:   Orders Placed This Encounter  Procedures  . Lipid panel  . Comprehensive metabolic panel      Bryan Lemma, M.D., M.S. Interventional Cardiologist   Pager # [phone] Phone # [phone] [address] [place], [place] [address]   Thank you for choosing Heartcare at [facility]!!

## 2018-04-03 NOTE — Patient Instructions (Signed)
Medication Instructions:  Not needed If you need a refill on your cardiac medications before your next appointment, please call your pharmacy.   Lab work: LIPID CMP- DO NOT EAT OR DRINK THE MORNING OF THE TEST  ASK PRIMARY ARE THERE ANY OTHER LABS  If you have labs (blood work) drawn today and your tests are completely normal, you will receive your results only by: [name]. MyChart Message (if you have MyChart) OR . A paper copy in the mail If you have any lab test that is abnormal or we need to change your treatment, we will call you to review the results.  Testing/Procedures: Not needed   Follow-Up: At [facility], you and your health needs are our priority.  As part of our continuing mission to provide you with exceptional heart care, we have created designated Provider Care Teams.  These Care Teams include your primary Cardiologist (physician) and Advanced Practice Providers (APPs -  Physician Assistants and Nurse Practitioners) who all work together to provide you with the care you need, when you need it. You will need a follow up appointment in 12 months Dec 2020.  Please call our office 2 months in advance to schedule this appointment.  You may see Brendan Lemmaavid Harding, MD or one of the following Advanced Practice Providers on your designated Care Team:   Brendan DemarkRhonda Barrett, PA-C . Brendan ReiningKathryn Lawrence, DNP, ANP  Any Other Special Instructions Will Be Listed Below (If Applicable).

## 2018-04-07 ENCOUNTER — Encounter: Payer: Self-pay | Admitting: Cardiology

## 2018-04-07 NOTE — Assessment & Plan Note (Signed)
I reviewed with CAD he has class I stable angina.  Remains on moderate dose losartan as well as propranolol as a beta-blocker.  Has not had to use nitroglycerin now in greater than 9 months.

## 2018-04-07 NOTE — Assessment & Plan Note (Signed)
Mild unilateral edema.  Still taking his Lasix just about every day.  Not taking additional doses though.  I suspect there is a neurologic component and do recommend support stockings.

## 2018-04-07 NOTE — Assessment & Plan Note (Signed)
LDL from last January looks pretty good.  He remains on atorvastatin with better lipid control since converting from simvastatin. Happily, we are now pretty close to goal as he would not be insulin taking any additional medications.  Continue to monitor no myalgias.

## 2018-04-07 NOTE — Assessment & Plan Note (Signed)
2 of 3 grafts patent with 1 occluded.  Minimal anginal symptoms if any. We talked in the past about doing a stress test, however at this point, I think he is not having active symptoms, would probably not consider stress test. Remains stable on aspirin and statin beta-blocker and ARB.  No changes.

## 2018-04-07 NOTE — Assessment & Plan Note (Signed)
Stable on current dose of losartan and propranolol.

## 2018-04-23 ENCOUNTER — Ambulatory Visit (INDEPENDENT_AMBULATORY_CARE_PROVIDER_SITE_OTHER): Payer: PPO | Admitting: Family Medicine

## 2018-04-23 ENCOUNTER — Encounter: Payer: Self-pay | Admitting: Family Medicine

## 2018-04-23 ENCOUNTER — Other Ambulatory Visit: Payer: Self-pay

## 2018-04-23 VITALS — BP 122/68 | HR 56 | Temp 97.7°F | Wt 199.6 lb

## 2018-04-23 DIAGNOSIS — R600 Localized edema: Secondary | ICD-10-CM

## 2018-04-23 DIAGNOSIS — R251 Tremor, unspecified: Secondary | ICD-10-CM

## 2018-04-23 DIAGNOSIS — E785 Hyperlipidemia, unspecified: Secondary | ICD-10-CM
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
## 2018-04-23 NOTE — Patient Instructions (Signed)
It was nice seeing you again today.  You were seen in clinic for routine physical.  You  had some blood work done today and we checked your cholesterol and kidney/liver function.  You will need to go to your pharmacy to get the tetanus shot as it would be more affordable there and I have provided you with this prescription.   I will fill out your DMV forms and have these ready by the end of the week. Please call clinic if any questions.  [name] MD

## 2018-04-24 LAB — COMPREHENSIVE METABOLIC PANEL WITH GFR
ALT: 13 IU/L (ref 0–44)
AST: 18 IU/L (ref 0–40)
Albumin/Globulin Ratio: 1.7 (ref 1.2–2.2)
Albumin: 4.3 g/dL (ref 3.5–4.8)
Alkaline Phosphatase: 169 IU/L — ABNORMAL HIGH (ref 39–117)
BUN/Creatinine Ratio: 9 — ABNORMAL LOW (ref 10–24)
BUN: 9 mg/dL (ref 8–27)
Bilirubin Total: 1.7 mg/dL — ABNORMAL HIGH (ref 0.0–1.2)
CO2: 25 mmol/L (ref 20–29)
Calcium: 9.5 mg/dL (ref 8.6–10.2)
Chloride: 101 mmol/L (ref 96–106)
Creatinine, Ser: 1.03 mg/dL (ref 0.76–1.27)
GFR calc Af Amer: 82 mL/min/1.73
GFR calc non Af Amer: 71 mL/min/1.73
Globulin, Total: 2.6 g/dL (ref 1.5–4.5)
Glucose: 197 mg/dL — ABNORMAL HIGH (ref 65–99)
Potassium: 4.8 mmol/L (ref 3.5–5.2)
Sodium: 142 mmol/L (ref 134–144)
Total Protein: 6.9 g/dL (ref 6.0–8.5)

## 2018-04-24 LAB — LIPID PANEL
Chol/HDL Ratio: 3.2 ratio (ref 0.0–5.0)
Cholesterol, Total: 109 mg/dL (ref 100–199)
HDL: 34 mg/dL — ABNORMAL LOW
LDL Calculated: 55 mg/dL (ref 0–99)
Triglycerides: 100 mg/dL (ref 0–149)
VLDL Cholesterol Cal: 20 mg/dL (ref 5–40)

## 2018-04-24 NOTE — Assessment & Plan Note (Signed)
Resolved, s/p bilateral implantable pulse generator Continues to follow w/ Dr. [name]

## 2018-04-29 ENCOUNTER — Telehealth: Payer: Self-pay | Admitting: Family Medicine

## 2018-04-29 NOTE — Telephone Encounter (Signed)
No worries, I have his DMV paperwork with me here.  Please inform her that I have not had a chance to fill this out over the weekend and am not in clinic today but will be doing so soon.  Thanks

## 2018-04-29 NOTE — Telephone Encounter (Signed)
I am leaving this form at the front office for them to pick up, please inform wife.   Of note, there are several sections that need to be filled out by the patient before it is considered complete, however I have completed my parts.

## 2018-04-29 NOTE — Telephone Encounter (Signed)
Pt wife informed. Sussan Meter, CMA  

## 2018-04-29 NOTE — Telephone Encounter (Signed)
Called patient to inquire as to what type of paperwork she is looking for.  No answer and no voicemail.  If patient or his wife calls back, please ask what paperwork they are looking for.  I have looked in Dr. Shanda Bumps box and red team folder and I see no tracking for any type of paperwork.  Glennie Hawk, CMA

## 2018-04-29 NOTE — Telephone Encounter (Signed)
[name] called and she want to know if Dr. Nelson ChimesAmin was able to sign her husband's paper works. Please call her back at  [phone] or [phone]. ad

## 2018-05-01 ENCOUNTER — Telehealth: Payer: Self-pay | Admitting: *Deleted

## 2018-05-01 NOTE — Telephone Encounter (Signed)
SPOKE TO WIFE Brendan [name] . INFORMED WIFE FORM IS READY FOR PICK UP  " PARTIAL HANDICAPPED PLACARD FORM " SHE STATES HE WILL PICK UP NO LATER THAN Friday. SHE VERBALIZED UNDERSTANDING

## 2018-05-03 ENCOUNTER — Other Ambulatory Visit: Payer: Self-pay

## 2018-05-03 ENCOUNTER — Encounter: Payer: PPO | Attending: Physical Medicine & Rehabilitation | Admitting: Physical Medicine & Rehabilitation

## 2018-05-03 ENCOUNTER — Encounter: Payer: Self-pay | Admitting: Physical Medicine & Rehabilitation

## 2018-05-03 VITALS — BP 150/79 | HR 60 | Ht 66.0 in | Wt 197.0 lb

## 2018-05-03 DIAGNOSIS — G479 Sleep disorder, unspecified: Secondary | ICD-10-CM | POA: Insufficient documentation

## 2018-05-03 DIAGNOSIS — G250 Essential tremor: Secondary | ICD-10-CM | POA: Diagnosis not present

## 2018-05-03 DIAGNOSIS — I10 Essential (primary) hypertension: Secondary | ICD-10-CM | POA: Diagnosis not present

## 2018-05-03 DIAGNOSIS — R339 Retention of urine, unspecified: Secondary | ICD-10-CM | POA: Diagnosis not present

## 2018-05-03 DIAGNOSIS — N319 Neuromuscular dysfunction of bladder, unspecified: Secondary | ICD-10-CM | POA: Diagnosis not present

## 2018-05-03 DIAGNOSIS — I619 Nontraumatic intracerebral hemorrhage, unspecified: Secondary | ICD-10-CM | POA: Diagnosis not present

## 2018-05-03 NOTE — Progress Notes (Signed)
Subjective:    Patient ID: Brendan [name], male    DOB: 08/17/1940, 78 y.o.   MRN: [id number]  HPI 78 year old right-handed male with history of CAD and stenting as well as CABG, memory loss with bilateral deep brain stimulator placement presents for follow up for left frontal intraparenchymal hemorrhage, DBS, debility.  Last clinic visit 12/28/17.  Wife provides majority of history. Since that time, he is doing HEP. He is following with Neurosurg/Neurology.  Wife notes improvement. He is having occasional pain in legs, managed with Robaxin ~1/day.  Tremors have resolved. Sleep is variable, using trazodone. He is still following up with Urology and requiring I/Ocaths, ~2/day  Pain Inventory Average Pain 0 Pain Right Now 0 My pain is sharp and aching  In the last 24 hours, has pain interfered with the following? General activity 5 Relation with others 0 Enjoyment of life 3 What TIME of day is your pain at its worst? night Sleep (in general) Poor  Pain is worse with: walking, sitting and some activites Pain improves with: medication Relief from Meds: 2  Mobility use a walker ability to climb steps?  yes do you drive?  no  Function retired  Neuro/Psych bladder control problems weakness tremor trouble walking dizziness confusion  Prior Studies Any changes since last visit?  no  Physicians involved in your care Any changes since last visit?  no   Family History  Problem Relation Age of Onset  . Heart disease Mother   . Heart disease Brother   . Tremor Paternal Uncle   . Heart disease Brother   . Drug abuse Daughter   . Hepatitis C Daughter    Social History   Socioeconomic History  . Marital status: Married    Spouse name: [name]  . Number of children: 2  . Years of education: 10  . Highest education level: Not on file  Occupational History  . Occupation: retired    Comment: Geneticist, molecular - Event organiser x 30 years  Social Needs  . Financial  resource strain: Not hard at all  . Food insecurity:    Worry: Never true    Inability: Never true  . Transportation needs:    Medical: No    Non-medical: No  Tobacco Use  . Smoking status: Former Smoker    Packs/day: 3.00    Years: 20.00    Pack years: 60.00    Types: Cigarettes    Last attempt to quit: 04/17/1968    Years since quitting: 50.0  . Smokeless tobacco: Never Used  Substance and Sexual Activity  . Alcohol use: No    Frequency: Never  . Drug use: No  . Sexual activity: Never  Lifestyle  . Physical activity:    Days per week: 0 days    Minutes per session: 0 min  . Stress: Only a little  Relationships  . Social connections:    Talks on phone: More than three times a week    Gets together: Three times a week    Attends religious service: Patient refused    Active member of club or organization: Patient refused    Attends meetings of clubs or organizations: Patient refused    Relationship status: Married  Other Topics Concern  . Not on file  Social History Narrative   Married.  Wife is [place].   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   Former smoker, quit 20 years ago. Does not drink alcohol.   Does not get routine exercise,  walks sometimes with a cane.   Caffeine use: diet-soda   Left handed    Past Surgical History:  Procedure Laterality Date  . CARDIAC CATHETERIZATION    . CATARACT EXTRACTION, BILATERAL    . CORONARY ARTERY BYPASS GRAFT  06/2002   LIMA-LAD, SVG-D1, SVG-OM  . EYE SURGERY    . KNEE SURGERY Left   . LEFT HEART CATHETERIZATION WITH CORONARY/GRAFT ANGIOGRAM N/A 10/13/2011   Procedure: LEFT HEART CATHETERIZATION WITH Isabel CapriceORONARY/GRAFT ANGIOGRAM;  Surgeon: Marykay Lexavid W Harding, MD;  [facility];  Service: Cardiovascular;  Laterality: N/A;  . MINOR PLACEMENT OF FIDUCIAL N/A 04/26/2017   Procedure: Fiducial placement;  Surgeon: Maeola HarmanStern, Joseph, MD;  [facility];  Service: Neurosurgery;  Laterality: N/A;  Fiducial placement  .  NM MYOCAR PERF WALL MOTION  03/23/2009   protocol:Bruce, normal perfusion in all regions, post-stress EF 72%, exercise capacity 7METS, EKG negative for ischemia.  [name]. PULSE GENERATOR IMPLANT Bilateral 11/13/2017   Procedure: Bilateral Implantable pulse generator;  Surgeon: Maeola HarmanStern, Joseph, MD;  [facility];  Service: Neurosurgery;  Laterality: Bilateral;  Bilateral Implantable pulse generator  . Shoulder Orthoscopic Surgery   06/2003  . SUBTHALAMIC STIMULATOR INSERTION Bilateral 05/04/2017   Procedure: Bilateral Deep brain stimulator placement;  Surgeon: Maeola HarmanStern, Joseph, MD;  [facility];  Service: Neurosurgery;  Laterality: Bilateral;  Bilateral deep brain stimulator placement  . TRANSTHORACIC ECHOCARDIOGRAM  04/2017   Jan 2019: Normal LV size and function.  EF 55-60%.  No or W MA.  Mild LA dilation.  . wrist ganglion cyst Left    Past Medical History:  Diagnosis Date  . Anxiety disorder   . Benign enlargement of prostate   . CAD in native artery 1997   Referred for CABG x3 in 2004 for LAD diagonal bifurcation lesion  . Cervical spinal stenosis   . Complex regional pain syndrome of right upper extremity    Right wrist; L arm  . Coronary atherosclerosis of artery bypass graft June 2013   Occluded SVG-D1; Cardiologist Dr. Herbie BaltimoreHarding  . Gait disorder   . Gastroesophageal reflux disease   . Glaucoma   . Hiatal hernia    GI: Dr Evette CristalGanem  . Hyperlipidemia LDL goal <70   . Hypertension   . Memory loss    Mild  . Obesity   . Peripheral neuropathy    possible peripheral neuropathy  . S/P CABG x 3 2004    LIMA-LAD, SVG to diagonal, SVG to OM  . Tremor, essential    On Primidone  . Unstable angina pectoris [facility])  June 2013   Cardiac cath: occluded SVG-DI. Patent LIMA-LAD and SVG-OM. EF 45% with apical inferior HK.   BP (!) 150/79   Pulse 60   Ht 5\' 6"  (1.676 m)   Wt 197 lb (89.4 kg)   SpO2 96%   BMI 31.80 kg/m   Opioid Risk Score:   Fall Risk Score:  `1  Depression screen PHQ  2/9  [facility] 05/03/2018 04/23/2018 12/28/2017 12/05/2017 11/28/2017 08/29/2017 08/17/2017  Decreased Interest 0 0 0 0 0 0 0  Down, Depressed, Hopeless 0 0 0 0 0 0 0  PHQ - 2 Score 0 0 0 0 0 0 0  Some recent data might be hidden     Review of Systems  Constitutional: Positive for diaphoresis.  HENT: Negative.   Eyes: Negative.   Respiratory: Negative.   Cardiovascular: Negative.   Gastrointestinal: Negative.   Endocrine: Negative.   Genitourinary: Negative.  Musculoskeletal: Positive for gait problem.  Skin: Negative.   Allergic/Immunologic: Negative.   Neurological: Positive for dizziness, tremors and weakness.  Hematological: Negative.   Psychiatric/Behavioral: Positive for confusion.  All other systems reviewed and are negative.      Objective:   Physical Exam General: NAD. Well-developed.  HEENT: Normocephalic, atraumatic. Cardio: RRR. No JVD. Resp: CTA bilaterally. Normal effort   GI: BS+ and ND Musc/Skel:  No edema, no tenderness. Gait: Wide based Neuro: Alert and oriented Aphasia continues to improve Motor:  RUE: 5/5 proximal to distal RLE: 5/5 proximal to distal No ataxia b/l UE No tremor Skin:   Intact. Warm and dry    Assessment & Plan:  78 year old right-handed male with history of CAD and stenting as well as CABG, memory loss with bilateral deep brain stimulator placement presents for follow up for left frontal intraparenchymal hemorrhage, DBS, debility.  1. Decreased functional mobility secondary to left frontal intraparenchymal hemorrhage with recent deep brain stimulator for tremor.    Cont HEP  Cont follow up with Neurology, Neurosurg  Tremor essentially resolved - long process with complications, patient believes it was all worth it, wife disagrees  Discussed gradual return to driving, although already driving.  2. Pain:    Mainly in b/l LE  Cont Robaxin 500 , ~1/day  Cont tylenol as needed  3. Tremors:  See #1  4. Sleep  disturbance    Cont Melatonin   Educated on sleep hygiene  Increased Trazodone to 100 qhs  5.  Neurogenic bladder  Improving  Cont Flomax   Cont follow up with Urology  Cont I/O caths, less frequently  6. HTN  Encouraged ambulatory monitoring  Encouraged follow up with PCP if persistently elevated

## 2018-05-06 ENCOUNTER — Other Ambulatory Visit: Payer: Self-pay | Admitting: Family Medicine

## 2018-05-06 ENCOUNTER — Telehealth: Payer: Self-pay | Admitting: Neurology

## 2018-05-06 ENCOUNTER — Telehealth: Payer: Self-pay

## 2018-05-06 NOTE — Telephone Encounter (Signed)
Attempted to call patient to inquire about Trazadone.  No answer and line is busy every time that I call. ( x 4).  Will try patient again tomorrow in the case that the phone is not working or is off the hook.  Glennie Hawk, CMA

## 2018-05-06 NOTE — Telephone Encounter (Signed)
From my knowledge, his trazodone dose was not increased to 100 mg and should remain 50 mg daily.

## 2018-05-06 NOTE — Telephone Encounter (Signed)
Spoke with patient's wife. She states DMV forms completed by PCP/ Cardiology. They need us to complete Neurology section. She will bring the forms by so that Dr. Arbutus Leasat can review.

## 2018-05-06 NOTE — Telephone Encounter (Signed)
Wife is calling about a paper that Dr.Tat needs to sign off on. It's 3 questions. She wanted to ask you a question about it. Please call her back at [phone]. Thanks!

## 2018-05-06 NOTE — Telephone Encounter (Signed)
Fax from pharmacy with note on it that says "pt's wife said new Rx needed for Trazadone for 100 mg".   Please send new Rx if appropriate.  Ples Specter, RN [facility] RN)

## 2018-05-07 NOTE — Telephone Encounter (Signed)
Attempted to call again. Still no answer. If patient calls again please relay message that per Dr. Nelson ChimesAmin she is not aware of an increase in strength of Trazadone to 100mg 's.  It should remain at 50mg .  .Glennie HawkSimpson, Kell Ferris R, CMA'

## 2018-05-07 NOTE — Telephone Encounter (Signed)
Attempted once again to call patient concerning Trazadone. Still no answer and a busy signal as before.  Glennie Hawk, CMA

## 2018-05-08 ENCOUNTER — Telehealth: Payer: Self-pay | Admitting: Neurology

## 2018-05-08 NOTE — Telephone Encounter (Signed)
Patient wife called to check on the paperwork they dropped off. She states that the office has to have them back by 05-16-18. She will need them in time to mail them off so they will have them by the 30th

## 2018-05-08 NOTE — Telephone Encounter (Signed)
Awaiting input from Dr. Arbutus Leas. I will contact patient's wife when the paperwork is complete.

## 2018-05-08 NOTE — Telephone Encounter (Signed)
Paperwork complete. Copy to chart. Patient's wife made aware and at front for pick up.

## 2018-05-13 ENCOUNTER — Other Ambulatory Visit: Payer: Self-pay | Admitting: Family Medicine

## 2018-05-20 ENCOUNTER — Other Ambulatory Visit: Payer: Self-pay | Admitting: Family Medicine

## 2018-05-21 ENCOUNTER — Telehealth: Payer: Self-pay

## 2018-05-21 NOTE — Telephone Encounter (Signed)
Received fax from pharmacy, PA needed on Methocarbamol. Clinical questions submitted via Cover My Meds.  Waiting on response, could take up to 72 hours.  Cover My Meds info: Key: [id number]  Ples Specter, RN [facility] RN)

## 2018-05-22 DIAGNOSIS — R339 Retention of urine, unspecified: Secondary | ICD-10-CM | POA: Diagnosis not present

## 2018-05-22 DIAGNOSIS — G8110 Spastic hemiplegia affecting unspecified side: Secondary | ICD-10-CM | POA: Diagnosis not present

## 2018-05-22 DIAGNOSIS — I619 Nontraumatic intracerebral hemorrhage, unspecified: Secondary | ICD-10-CM | POA: Diagnosis not present

## 2018-05-22 NOTE — Telephone Encounter (Signed)
Approved until 05/21/19.  Pharmacy informed. Kensie Susman, Maryjo Rochester, CMA

## 2018-06-10 ENCOUNTER — Ambulatory Visit: Payer: PPO | Admitting: Family Medicine

## 2018-06-10 NOTE — Progress Notes (Signed)
Subjective:   Brendan [name] was seen in consultation in the movement disorder clinic at the request of Dr. [name].  His PCP is [name], MD.  The evaluation is for possible DBS for essential tremor. The records that were made available to me were reviewed. This patient is accompanied in the office by his spouse who supplements the history.  Tremor started approximately 25 years ago and involves the bilateral hands and head.   It started in the L hand but about 5 years ago he noted some tremor in the right hand.   Wife thinks that he has had head tremor for 25 years.  He is L hand dominant.   Tremor is most noticeable when he tries to eat.  Wife does note some rest tremor.   There is a family hx of tremor in paternal uncle, now deceased.    Affected by caffeine: doesn't know (doesn't drink caffeine) Affected by alcohol:  Rarely drinks so doesn't know Affected by stress:  Yes.  , per wife Affected by fatigue:  Yes.   Spills soup if on spoon:  Yes.   Spills glass of liquid if full:  Yes.   Affects ADL's (tying shoes, brushing teeth, etc):  Yes.   (does okay with teeth and shoes but trouble combing hair; shaves with electric)  Current/Previously tried tremor medications: propranolol (80 mg bid), primidone (250 mg bid), gabapentin (600 mg tid)  Current medications that may exacerbate tremor:  n/a  Outside reports reviewed: historical medical records, office notes and referral letter/letters. Reviewed patients most recent CT brain done in 2014 and there was atrophy but otherwise unremarkable.  Any other sx's: Voice: no change Sleep: trouble staying asleep (sleeps in recliner because the tremor wakes him up less)  Vivid Dreams:  No.  Acting out dreams:  No. Wet Pillows: No. Postural symptoms:  Yes.  , when first wakes up  Falls?  No. Bradykinesia symptoms: trouble getting out of chair or couch if low Loss of smell:  No. Loss of taste:  No. Urinary Incontinence:  No. Difficulty  Swallowing:  Yes.   Handwriting, micrographia: unknown - cannot write at all because of tremor Depression:  No. Memory changes:  No. Hallucinations:  No.  visual distortions: No. N/V:  No. Lightheaded:  No.  Syncope: No. Diplopia:  No. Dyskinesia:  No.  02/13/17 update: Patient presents today in follow-up for his essential tremor.  He is accompanied by his wife and daughter who supplements the history.  He had neurocognitive testing with Dr. [name] on October 26, 2016.  There is evidence of mild cognitive impairment but no evidence of dementia.  Saw Dr. [name] 01/31/17.    At that point in time, the patient was unsure if he wanted to proceed with DBS.  He was told by Dr. [name] that he would need cardiac clearance.  He did have an MRI of the brain, but the T2 images did not have proper spacing.    04/20/17 update: Patient is seen today for essential tremor for preop video and any last minute questions.  He is accompanied by his wife who supplements the history.  The patient is scheduled for fiducial placement on April 26, 2017.  He is scheduled for bilateral VIM DBS on May 04, 2017.  The patient is currently on primidone, 250 mg twice daily.  He is also on gabapentin, 600 mg 3 times per day.  06/18/17 update: Patient is seen today in follow-up for essential tremor.  He  is accompanied by his daughter who supplements the history.  Much has happened since our last visit.  Records are reviewed.  The patient was admitted to the hospital on May 04, 2017 for DBS surgery.  Postoperative images that same day indicated that the patient had pneumocephalus, but no blood was present at the time.  Postoperatively, the patient experienced confusion and had a CT of the brain demonstrating a small parenchymal hemorrhage along the left DBS lead.  The patient experienced right hemiparesis and confusion and a dysphasia.  He had another MRI of the brain on May 17, 2017 demonstrating a probable postoperative  hematoma.  The patient went to subacute nursing facility for rehab, but quickly bounced back to the hospital and subsequently ended up in inpatient rehab.  When he was admitted to inpatient rehab, strength was 0/5, he was lethargic and had significant difficulties with speech.  He was admitted to rehab from May 18, 2017 to June 09, 2017.  He made marked recovery and progress while in rehab.  When discharged, strength was 4/5 in the right upper and lower extremities and he was transferring with and without a rolling walker with minimal assist.  Overall, the patient and daughter are pleased with his progress.  He is doing very well at home and rarely uses the walker within the house.  But is not confused during the day.  He has some word finding troubles. daughter states that wakes up in the middle of the night confused.  On seroquel for sleep.  Having trouble reading - vision too blurry to read.    11/19/17 update: Patient is seen today in follow-up for essential tremor and for turning on his DBS.   Pt is accompanied by his grandson and wife who supplement the history.   He is still on primidone, 50 mg twice per day. Also on trazodone 50 mg q hs for sleep but still has some sleep trouble.  Much has happened since our last visit.  Numerous records have been reviewed.  Patient was in the hospital in April with Pseudomonas epididymitis.  He has also been treated for multiple urinary tract infections since last visit and has also been treated by pulmonary for cough.  He was finally able to have his battery placed on November 13, 2017.  Dr. Fredrich BirksStern's nurse contacted me this morning and stated that the patient had a few falls on Friday and some confusion.  He had trouble manipulating the walker.  However, this resolved over the weekend.  When asked about this today, confusion is denied by pt/wife but stated that he had episode of "jerking" and that caused him to let go of/throw the walker and he fell because of that.   Feeling good. Wife states that he is not 100% physically back to where he was before surgery but mentally he is.  Has some word finding trouble but had that issue prior to sx per wife.  11/26/17 update: Patient is seen today for follow-up essential tremor, status post activation of DBS device 1 week ago.  Patient is seen accompanied by his wife who supplements the history.  He remains on primidone, 50 mg twice per day.  He reports that tremor has been improved over the last week but still present.  Wife is not sure that the stimulator is really helping, but the patient states it is some.  Tremor seems to come and go.  No falls since last visit.  Saw Dr. Venetia MaxonStern and he will take staples  out this Friday.  Wife asks me to show her how to use patient programmer again.  She also asks me to look at his left foot as he felt quite some time ago (prior to last visit) and has a wound on the foot.  01/07/18 update: Patient is seen today in follow-up for essential tremor.  Patient remains on primidone, 50 mg twice per day.  Reports that tremor has been good until a recent fall and noted a little more tremor since then.  Now on robaxin by PMR.  Still on gabapentin.   Fell last Monday.  Was coming out of physician office and fell off curb outside.  No other falls.  06/11/18 update: Patient is seen today in follow-up for essential tremor.  Last visit, I discontinued his primidone.  Reports that he did fine with that.  I also decreased his gabapentin because he had previously been having myoclonus.  I also decreased it as I was not sure he really needed it.  He is doing well with that.  He continues to follow with physiatry.  He last saw him on January 17.  I have been continuing Robaxin, 500 mg once per day.  they increased his trazodone to 100 mg at night for sleep but it looks like they called the PCP for the refill and she was unaware of the increase by physiatry so he is still on 50 mg daily.  They ask me about that today.   His memory has been good.  His wife agrees with that.  In fact, he is doing most of the driving.  His wife states that his driving is very good.  The DMV contacted him since our last visit and he had to take a driving evaluation yesterday.  There apparently some concerns about memory change, although patient/wife do not know where that was driven from.  They really do not have the same concerns.  Wife has noticed occasional word finding troubles, but otherwise does not notice issues.  He does not know the results of that.  He was told that they would contact him from [place] within the next week.  His balance has been much better.  He has not had any falls.    Allergies  Allergen Reactions  . Lisinopril Cough    Outpatient Encounter Medications as of 06/11/2018  Medication Sig  . ALLERGY RELIEF 10 MG tablet TAKE 1 TABLET BY MOUTH DAILY  . aspirin EC 81 MG tablet Take 81 mg by mouth daily.  [name]. atorvastatin (LIPITOR) 40 MG tablet TAKE 1 TABLET BY MOUTH DAILY  . candesartan (ATACAND) 32 MG tablet Take 1 tablet (32 mg total) by mouth daily.  . fluticasone (FLONASE) 50 MCG/ACT nasal spray Place 2 sprays into both nostrils daily.  . furosemide (LASIX) 20 MG tablet TAKE 1 TABLET BY MOUTH DAILY, TAKE AN EXTRA DOSE IF NEEDED  . gabapentin (NEURONTIN) 300 MG capsule Take 1 capsule (300 mg total) by mouth at bedtime.  [name]. ketoconazole (NIZORAL) 2 % shampoo Apply 1 application topically once a week.  . Melatonin 3 MG TABS Take 1 tablet (3 mg total) by mouth at bedtime.  . methocarbamol (ROBAXIN) 750 MG tablet TAKE 1 TABLET BY MOUTH TWICE DAILY AS NEEDED FOR MUSCLE SPASMS  . omeprazole (PRILOSEC) 20 MG capsule Take 1 capsule (20 mg total) by mouth daily as needed (acid reflux).  [name]. PROAIR HFA 108 (90 Base) MCG/ACT inhaler Take 2 puffs by mouth every 6 (six) hours as needed for  wheezing or shortness of breath.   . propranolol (INDERAL) 40 MG tablet Take 1 tablet (40 mg total) by mouth 2 (two) times daily.  [name]  Propylene Glycol (SYSTANE BALANCE) 0.6 % SOLN Place 1 drop into both eyes daily as needed (for dry eyes).  . tamsulosin (FLOMAX) 0.4 MG CAPS capsule Take 1 capsule (0.4 mg total) by mouth daily after supper.  . traMADol (ULTRAM) 50 MG tablet Take 1 tablet (50 mg total) by mouth every 6 (six) hours as needed for moderate pain.  . traZODone (DESYREL) 50 MG tablet TAKE 1 TABLET BY MOUTH EVERY NIGHT AT BEDTIME  . nitroGLYCERIN (NITROSTAT) 0.4 MG SL tablet Place 1 tablet (0.4 mg total) under the tongue every 5 (five) minutes as needed for chest pain. call 911 if chest pain not better (Patient not taking: Reported on 06/11/2018)   Facility-Administered Encounter Medications as of 06/11/2018  Medication  . 0.9 %  sodium chloride infusion  . sodium chloride 0.9 % injection 3 mL    Past Medical History:  Diagnosis Date  . Anxiety disorder   . Benign enlargement of prostate   . CAD in native artery 1997   Referred for CABG x3 in 2004 for LAD diagonal bifurcation lesion  . Cervical spinal stenosis   . Complex regional pain syndrome of right upper extremity    Right wrist; L arm  . Coronary atherosclerosis of artery bypass graft June 2013   Occluded SVG-D1; Cardiologist Dr. Herbie Baltimore  . Gait disorder   . Gastroesophageal reflux disease   . Glaucoma   . Hiatal hernia    GI: Dr Evette Cristal  . Hyperlipidemia LDL goal <70   . Hypertension   . Memory loss    Mild  . Obesity   . Peripheral neuropathy    possible peripheral neuropathy  . S/P CABG x 3 2004    LIMA-LAD, SVG to diagonal, SVG to OM  . Tremor, essential    On Primidone  . Unstable angina pectoris [facility])  June 2013   Cardiac cath: occluded SVG-DI. Patent LIMA-LAD and SVG-OM. EF 45% with apical inferior HK.    Past Surgical History:  Procedure Laterality Date  . CARDIAC CATHETERIZATION    . CATARACT EXTRACTION, BILATERAL    . CORONARY ARTERY BYPASS GRAFT  06/2002   LIMA-LAD, SVG-D1, SVG-OM  . EYE SURGERY    . KNEE SURGERY Left   . LEFT  HEART CATHETERIZATION WITH CORONARY/GRAFT ANGIOGRAM N/A 10/13/2011   Procedure: LEFT HEART CATHETERIZATION WITH Isabel Caprice;  Surgeon: Marykay Lex, MD;  Location: [facility] CATH LAB;  Service: Cardiovascular;  Laterality: N/A;  . MINOR PLACEMENT OF FIDUCIAL N/A 04/26/2017   Procedure: Fiducial placement;  Surgeon: Maeola Harman, MD;  Location[facility];  Service: Neurosurgery;  Laterality: N/A;  Fiducial placement  . NM MYOCAR PERF WALL MOTION  03/23/2009   protocol:Bruce, normal perfusion in all regions, post-stress EF 72%, exercise capacity , EKG negative for ischemia.  [name] PULSE GENERATOR IMPLANT Bilateral 11/13/2017   Procedure: Bilateral Implantable pulse generator;  Surgeon: Maeola Harman, MD;  Location: [facility] OR;  Service: Neurosurgery;  Laterality: Bilateral;  Bilateral Implantable pulse generator  . Shoulder Orthoscopic Surgery   06/2003  . SUBTHALAMIC STIMULATOR INSERTION Bilateral 05/04/2017   Procedure: Bilateral Deep brain stimulator placement;  Surgeon: Maeola Harman, MD;  Location: [facility] OR;  Service: Neurosurgery;  Laterality: Bilateral;  Bilateral deep brain stimulator placement  . TRANSTHORACIC ECHOCARDIOGRAM  04/2017   Jan 2019: Normal LV size and function.  EF  55-60%.  No or W MA.  Mild LA dilation.  . wrist ganglion cyst Left     Social History   Socioeconomic History  . Marital status: Married    Spouse name: [name]  . Number of children: 2  . Years of education: 10  . Highest education level: Not on file  Occupational History  . Occupation: retired    Comment: Geneticist, molecular - Event organiser x 30 years  Social Needs  . Financial resource strain: Not hard at all  . Food insecurity:    Worry: Never true    Inability: Never true  . Transportation needs:    Medical: No    Non-medical: No  Tobacco Use  . Smoking status: Former Smoker    Packs/day: 3.00    Years: 20.00    Pack years: 60.00    Types: Cigarettes    Last attempt to quit: 04/17/1968    Years since  quitting: 50.1  . Smokeless tobacco: Never Used  Substance and Sexual Activity  . Alcohol use: No    Frequency: Never  . Drug use: No  . Sexual activity: Never  Lifestyle  . Physical activity:    Days per week: 0 days    Minutes per session: 0 min  . Stress: Only a little  Relationships  . Social connections:    Talks on phone: More than three times a week    Gets together: Three times a week    Attends religious service: Patient refused    Active member of club or organization: Patient refused    Attends meetings of clubs or organizations: Patient refused    Relationship status: Married  . Intimate partner violence:    Fear of current or ex partner: No    Emotionally abused: No    Physically abused: No    Forced sexual activity: No  Other Topics Concern  . Not on file  Social History Narrative   Married.  Wife is Royal Piedra.   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   Former smoker, quit 20 years ago. Does not drink alcohol.   Does not get routine exercise, walks sometimes with a cane.   Caffeine use: diet-soda   Left handed     Family Status  Relation Name Status  . Mother  Deceased at age 13       CHF  . Father  Deceased at age 63       following a fall  . Sister  Alive  . Brother  Alive  . [employer]  Alive  . Brother  Alive       pt. has four brothers  . Sister  Alive  . Daughter  Alive  . Daughter  Alive  . Brother  Alive  . Brother  Alive    Review of Systems Review of Systems  Constitutional: Negative.   HENT: Negative.   Eyes: Negative.   Respiratory: Negative.   Cardiovascular: Negative.   Gastrointestinal: Negative.   Genitourinary: Negative.   Skin: Negative.       Objective:   VITALS:   Vitals:   06/11/18 1100  BP: (!) 142/66  Pulse: 62  SpO2: 96%  Weight: 198 lb (89.8 kg)  Height:  (1.702 m)   GEN:  The patient appears stated age and is in NAD. HEENT:  Normocephalic, atraumatic.  The mucous membranes are  moist. The superficial temporal arteries are without ropiness or tenderness. CV: Regular rate rhythm Lungs: Clear to auscultation bilaterally  Neck/HEME: No carotid bruits   Neurological examination:  Orientation:  Montreal Cognitive Assessment  06/11/2018  Visuospatial/ Executive (0/5) 3  Naming (0/3) 2  Attention: Read list of digits (0/2) 1  Attention: Read list of letters (0/1) 1  Attention: Serial 7 subtraction starting at 100 (0/3) 3  Language: Repeat phrase (0/2) 0  Language : Fluency (0/1) 0  Abstraction (0/2) 0  Delayed Recall (0/5) 0  Orientation (0/6) 6  Total 16  Adjusted Score (based on education) 17   Cranial nerves: There is good facial symmetry. The speech is fluent and clear. Soft palate rises symmetrically and there is no tongue deviation. Hearing is intact to conversational tone. Sensation: Sensation is intact to light touch throughout Motor: Strength is 5/5 in the bilateral upper and lower extremities.  Movement examination: Tone: There is normal tone in the UE/LE Abnormal movements: Prior programming today, there was very mild tremor of the right upper extremity. Coordination:  There is no decremation with RAM's, with any form of RAMS, including alternating supination and pronation of the forearm, hand opening and closing, finger taps, heel taps and toe taps. Gait and Station: The patient pushes off of the chair to arise.  He is wide-based, but walks well down the hall.  He does not use any ambulatory assistive device today.   labs: Lab Results  Component Value Date   HGBA1C 5.8 (H) 05/16/2017     Chemistry      Component Value Date/Time   NA 142 04/23/2018 1453   K 4.8 04/23/2018 1453   CL 101 04/23/2018 1453   CO2 25 04/23/2018 1453   BUN 9 04/23/2018 1453   CREATININE 1.03 04/23/2018 1453   CREATININE 1.02 05/01/2016 1118      Component Value Date/Time   CALCIUM 9.5 04/23/2018 1453   ALKPHOS 169 (H) 04/23/2018 1453   AST 18 04/23/2018 1453    ALT 13 04/23/2018 1453   BILITOT 1.7 (H) 04/23/2018 1453     DBS programming was performed today which is described in more detail on a separate programming procedure note.     Assessment/Plan:   1.  Essential Tremor.  -Patient is status post bilateral DBS to the bilateral VIM on May 04, 2017.  He suffered a postoperative bleed on the left, with resultant hematoma causing initial paralysis of the right hemi-soma and then mild paresis on the right.  Because of these complications, along with multiple other infections, the patient's battery was not placed until November 13, 2017.    -Patient is off of primidone and doing well.  Device was programmed today.  2.  Possible myoclonus  -This resolved once we decreased gabapentin.  He is only on 300 mg at night now.  He may be able to get off of this in the future.  3.  Insomnia  -Physiatry had increased his trazodone to 100 mg at night, but he called his primary care for refill and she was unaware that.  I told him to go ahead and call Dr. Allena Katz for his refill so that he can get the prescribed dosage.  4.  Memory change  -Moca was 17, but the patient did very well on Trail making, which will often predict driving.  He was also fully oriented and did well on his clock drawing.  He did well with serial sevens.  He had trouble with delayed recall and naming words with the letter "F."  I am not sure that he fully meets criteria for dementia  at this point in time, but he has recently had a driving evaluation through the state and is awaiting the results of that.  I do think that his cerebral hemorrhage affected him, but I have only seen improvement in memory and cognition over the course of time.  Safety discussed.  5.  I will see the patient back in the next 6 to 8 months, sooner should new neurologic issues arise.  Much greater than 50% of this visit was spent in counseling and coordinating care.  Total face to face time:  30 min, which was independent of  DBS time.   CC:  [name], MD

## 2018-06-11 ENCOUNTER — Ambulatory Visit (INDEPENDENT_AMBULATORY_CARE_PROVIDER_SITE_OTHER): Payer: PPO | Admitting: Neurology

## 2018-06-11 ENCOUNTER — Encounter: Payer: Self-pay | Admitting: Neurology

## 2018-06-11 VITALS — BP 142/66 | HR 62 | Ht 67.0 in | Wt 198.0 lb

## 2018-06-11 DIAGNOSIS — G250 Essential tremor: Secondary | ICD-10-CM

## 2018-06-11 DIAGNOSIS — G4700 Insomnia, unspecified: Secondary | ICD-10-CM | POA: Diagnosis not present

## 2018-06-11 DIAGNOSIS — R413 Other amnesia: Secondary | ICD-10-CM | POA: Diagnosis not present

## 2018-06-11 NOTE — Procedures (Signed)
DBS Programming was performed.    Type of device:  Medtronic  Total time spent programming was 10 minutes.  Device was on.  Soft start was confirmed to be on.  Impedences were checked and were within normal limits.  Battery was checked and was determined to be functioning normally and not near the end of life (2.98 on both sides).  Final settings were as follows:  Left brain electrode:     1-0+           ; Amplitude  2.3  V   ; Pulse width 90 microseconds;   Frequency   160   Hz.  Right brain electrode:     1-0+          ; Amplitude   2.6  V ;  Pulse width 90  microseconds;  Frequency   150    Hz.

## 2018-06-13 ENCOUNTER — Telehealth: Payer: Self-pay | Admitting: *Deleted

## 2018-06-13 MED ORDER — TRAZODONE HCL 50 MG PO TABS: 100.0000 mg | ORAL_TABLET | Freq: Every day | ORAL | 1 refills | Status: DC

## 2018-06-13 NOTE — Telephone Encounter (Signed)
Mrs Bonder called and said Dr Allena Katz increased Tobin's trazodone to 100 mg.  She is asking that we send the order to the pharmacy. Noted in last visit note from 05/03/18.  Is it ok to order this?

## 2018-06-13 NOTE — Addendum Note (Signed)
Addended by: Doreene Eland on: 06/13/2018 02:13 PM   Modules accepted: Orders

## 2018-06-13 NOTE — Telephone Encounter (Signed)
New order placed and Mr Ryals notified.

## 2018-06-13 NOTE — Telephone Encounter (Signed)
Yes please. Thanks. 

## 2018-06-28 ENCOUNTER — Encounter: Payer: PPO | Admitting: Physical Medicine & Rehabilitation

## 2018-06-28 ENCOUNTER — Other Ambulatory Visit: Payer: Self-pay

## 2018-06-28 ENCOUNTER — Ambulatory Visit (INDEPENDENT_AMBULATORY_CARE_PROVIDER_SITE_OTHER)
Admission: RE | Admit: 2018-06-28 | Discharge: 2018-06-28 | Disposition: A | Discharge status: Home / Self Care | Payer: PPO | Source: Ambulatory Visit | Attending: Emergency Medicine | Admitting: Emergency Medicine

## 2018-06-28 DIAGNOSIS — J920 Pleural plaque with presence of asbestos: Secondary | ICD-10-CM

## 2018-06-28 DIAGNOSIS — J929 Pleural plaque without asbestos: Secondary | ICD-10-CM | POA: Diagnosis not present

## 2018-06-28 DIAGNOSIS — J90 Pleural effusion, not elsewhere classified: Secondary | ICD-10-CM | POA: Diagnosis not present

## 2018-07-04 ENCOUNTER — Other Ambulatory Visit: Payer: Self-pay | Admitting: Cardiology

## 2018-08-01 DIAGNOSIS — G8110 Spastic hemiplegia affecting unspecified side: Secondary | ICD-10-CM | POA: Diagnosis not present

## 2018-08-01 DIAGNOSIS — R339 Retention of urine, unspecified: Secondary | ICD-10-CM | POA: Diagnosis not present

## 2018-08-01 DIAGNOSIS — I619 Nontraumatic intracerebral hemorrhage, unspecified: Secondary | ICD-10-CM | POA: Diagnosis not present

## 2018-08-13 ENCOUNTER — Other Ambulatory Visit: Payer: Self-pay | Admitting: Neurology

## 2018-08-23 ENCOUNTER — Other Ambulatory Visit: Payer: Self-pay | Admitting: Family Medicine

## 2018-08-26 ENCOUNTER — Other Ambulatory Visit: Payer: Self-pay | Admitting: Family Medicine

## 2018-09-02 ENCOUNTER — Other Ambulatory Visit: Payer: Self-pay | Admitting: Family Medicine

## 2018-09-02 NOTE — Telephone Encounter (Signed)
Patient needs Methocarbamol and Loratadine filled asap as he says he has been out for 4 weeks.  He cannot get thru to pharmacy to get refilled.  Pharmacy is [facility].

## 2018-09-07 ENCOUNTER — Other Ambulatory Visit: Payer: Self-pay | Admitting: Family Medicine

## 2018-09-10 ENCOUNTER — Other Ambulatory Visit: Payer: Self-pay | Admitting: Family Medicine

## 2018-09-10 MED ORDER — METHOCARBAMOL 750 MG PO TABS: ORAL_TABLET | ORAL | 2 refills | Status: DC

## 2018-09-10 MED ORDER — LORATADINE 10 MG PO TABS: 10.0000 mg | ORAL_TABLET | Freq: Every day | ORAL | 5 refills | Status: DC

## 2018-09-10 NOTE — Telephone Encounter (Signed)
I have provided refills of both -- please inform patient they were sent to his pharmacy, thanks

## 2018-09-11 NOTE — Telephone Encounter (Signed)
Called and left voice message for patient concerning RX's at pharmacy.  Glennie Hawk, CMA

## 2018-10-04 ENCOUNTER — Other Ambulatory Visit: Payer: Self-pay | Admitting: Family Medicine

## 2018-11-08 DIAGNOSIS — R339 Retention of urine, unspecified: Secondary | ICD-10-CM | POA: Diagnosis not present

## 2018-11-08 DIAGNOSIS — G8110 Spastic hemiplegia affecting unspecified side: Secondary | ICD-10-CM | POA: Diagnosis not present

## 2018-11-08 DIAGNOSIS — I619 Nontraumatic intracerebral hemorrhage, unspecified: Secondary | ICD-10-CM | POA: Diagnosis not present

## 2018-11-11 ENCOUNTER — Other Ambulatory Visit: Payer: Self-pay | Admitting: Neurology

## 2018-11-11 ENCOUNTER — Telehealth: Payer: Self-pay | Admitting: Neurology

## 2018-11-11 MED ORDER — PRIMIDONE 50 MG PO TABS: 50.0000 mg | ORAL_TABLET | Freq: Every day | ORAL | 1 refills | Status: DC

## 2018-11-11 NOTE — Telephone Encounter (Signed)
Looks like he was on primidone 50mg  BID in the past, would start low dose again, Primidone 50mg : take 1 tab at bedtime. Rx sent, pls notify patient, thanks

## 2018-11-11 NOTE — Telephone Encounter (Signed)
Wife is calling in about patient being shaky. Please call her back at [phone]. Thanks!

## 2018-11-11 NOTE — Telephone Encounter (Signed)
Pt c/o increasing tremor Current tremor meds AND times they are taken Gabapentin 300 mg at bedtime  Pts next appointment: 01/09/2019  Pt  spouse notice increase getting worst on yesterday today its bad. Pt spouse thinks he may need to adjust his DBS setting.     Office visit 06/11/18  Assessment/Plan:   1.  Essential Tremor.             -Patient is status post bilateral DBS to the bilateral VIM on May 04, 2017.  He suffered a postoperative bleed on the left, with resultant hematoma causing initial paralysis of the right hemi-soma and then mild paresis on the right.  Because of these complications, along with multiple other infections, the patient's battery was not placed until November 13, 2017.               -Patient is off of primidone and doing well.  Device was programmed today.  2.  Possible myoclonus             -This resolved once we decreased gabapentin.  He is only on 300 mg at night now.  He may be able to get off of this in the future.

## 2018-11-11 NOTE — Telephone Encounter (Signed)
Please inform patient that Dr. Carles Collet is out of the office this week and will return next Wednesday.  Would they be willing to wait until she can decide the next step?  If tremors are severe, the other option is adding low dose primidone, but it looks like he was tapered off this in the past in hopes to optimize DBS.

## 2018-11-11 NOTE — Telephone Encounter (Signed)
Called spoke with patient and patient spouse they are in agreement with adding a low dose piminodine  Pharmacy verified

## 2018-11-12 ENCOUNTER — Other Ambulatory Visit: Payer: Self-pay

## 2018-11-12 MED ORDER — TRAMADOL HCL 50 MG PO TABS: 50.0000 mg | ORAL_TABLET | Freq: Four times a day (QID) | ORAL | 0 refills | Status: DC | PRN

## 2018-11-12 NOTE — Telephone Encounter (Signed)
Called spoke with patient spouse she states that they pick up Rx yesterday he started last night

## 2018-11-12 NOTE — Telephone Encounter (Signed)
Received Rx for Tramadol--PMP appropriate, reviewed. Please schedule patient for an appointment.  He has had markedly elevated blood glucoses on his metabolic panels.  I am concerned he needs an agent for his diabetes.  When we determine who his next PCP is to be it is very important to note he has hereditary spherocytosis and this will make his A1c appear markedly well controlled when in fact he has diabetes

## 2018-12-02 ENCOUNTER — Telehealth: Payer: Self-pay | Admitting: Neurology

## 2018-12-02 NOTE — Telephone Encounter (Signed)
Pt is sch for Friday and I have him in a 60 min slot

## 2018-12-02 NOTE — Telephone Encounter (Signed)
Called spoke with Brendan [name] she states that he has had 2 shaking spells one when Dr. Carles Collet was on vacation the most recent was on Saturday night. Tremors in hands and arms  She thinks his device needs to be adjusted. Last adjustment was at his last office visit.  Current medication: gabapentin 300 mg at bedtime Primidone 50 mg at bedtime

## 2018-12-02 NOTE — Telephone Encounter (Signed)
You can bring him in office Friday AM.  Please put in a movement slot

## 2018-12-02 NOTE — Telephone Encounter (Signed)
Patient is having 2 shaking spells.and she asking if the DBS needs to be turned up. Thanks!

## 2018-12-03 DIAGNOSIS — N401 Enlarged prostate with lower urinary tract symptoms: Secondary | ICD-10-CM | POA: Diagnosis not present

## 2018-12-03 DIAGNOSIS — N3000 Acute cystitis without hematuria: Secondary | ICD-10-CM | POA: Diagnosis not present

## 2018-12-03 DIAGNOSIS — R351 Nocturia: Secondary | ICD-10-CM | POA: Diagnosis not present

## 2018-12-04 NOTE — Progress Notes (Signed)
Subjective:   Brendan [name] was seen in consultation in the movement disorder clinic at the request of Dr. Anne HahnWillis.  His PCP is Autry-Lott, Simone, DO.  The evaluation is for possible DBS for essential tremor. The records that were made available to me were reviewed. This patient is accompanied in the office by his spouse who supplements the history.  Tremor started approximately 25 years ago and involves the bilateral hands and head.   It started in the L hand but about 5 years ago he noted some tremor in the right hand.   Wife thinks that he has had head tremor for 25 years.  He is L hand dominant.   Tremor is most noticeable when he tries to eat.  Wife does note some rest tremor.   There is a family hx of tremor in paternal uncle, now deceased.    Affected by caffeine: doesn't know (doesn't drink caffeine) Affected by alcohol:  Rarely drinks so doesn't know Affected by stress:  Yes.  , per wife Affected by fatigue:  Yes.   Spills soup if on spoon:  Yes.   Spills glass of liquid if full:  Yes.   Affects ADL's (tying shoes, brushing teeth, etc):  Yes.   (does okay with teeth and shoes but trouble combing hair; shaves with electric)  Current/Previously tried tremor medications: propranolol (80 mg bid), primidone (250 mg bid), gabapentin (600 mg tid)  Current medications that may exacerbate tremor:  n/a  Outside reports reviewed: historical medical records, office notes and referral letter/letters. Reviewed patients most recent CT brain done in 2014 and there was atrophy but otherwise unremarkable.  Any other sx's: Voice: no change Sleep: trouble staying asleep (sleeps in recliner because the tremor wakes him up less)  Vivid Dreams:  No.  Acting out dreams:  No. Wet Pillows: No. Postural symptoms:  Yes.  , when first wakes up  Falls?  No. Bradykinesia symptoms: trouble getting out of chair or couch if low Loss of smell:  No. Loss of taste:  No. Urinary Incontinence:  No. Difficulty  Swallowing:  Yes.   Handwriting, micrographia: unknown - cannot write at all because of tremor Depression:  No. Memory changes:  No. Hallucinations:  No.  visual distortions: No. N/V:  No. Lightheaded:  No.  Syncope: No. Diplopia:  No. Dyskinesia:  No.  02/13/17 update: Patient presents today in follow-up for his essential tremor.  He is accompanied by his wife and daughter who supplements the history.  He had neurocognitive testing with Dr. Alinda DoomsBailar on October 26, 2016.  There is evidence of mild cognitive impairment but no evidence of dementia.  Saw Dr. Venetia MaxonStern 01/31/17.    At that point in time, the patient was unsure if he wanted to proceed with DBS.  He was told by Dr. Venetia MaxonStern that he would need cardiac clearance.  He did have an MRI of the brain, but the T2 images did not have proper spacing.    04/20/17 update: Patient is seen today for essential tremor for preop video and any last minute questions.  He is accompanied by his wife who supplements the history.  The patient is scheduled for fiducial placement on April 26, 2017.  He is scheduled for bilateral VIM DBS on May 04, 2017.  The patient is currently on primidone, 250 mg twice daily.  He is also on gabapentin, 600 mg 3 times per day.  06/18/17 update: Patient is seen today in follow-up for essential tremor.  He  is accompanied by his daughter who supplements the history.  Much has happened since our last visit.  Records are reviewed.  The patient was admitted to the hospital on May 04, 2017 for DBS surgery.  Postoperative images that same day indicated that the patient had pneumocephalus, but no blood was present at the time.  Postoperatively, the patient experienced confusion and had a CT of the brain demonstrating a small parenchymal hemorrhage along the left DBS lead.  The patient experienced right hemiparesis and confusion and a dysphasia.  He had another MRI of the brain on May 17, 2017 demonstrating a probable postoperative  hematoma.  The patient went to subacute nursing facility for rehab, but quickly bounced back to the hospital and subsequently ended up in inpatient rehab.  When he was admitted to inpatient rehab, strength was 0/5, he was lethargic and had significant difficulties with speech.  He was admitted to rehab from May 18, 2017 to June 09, 2017.  He made marked recovery and progress while in rehab.  When discharged, strength was 4/5 in the right upper and lower extremities and he was transferring with and without a rolling walker with minimal assist.  Overall, the patient and daughter are pleased with his progress.  He is doing very well at home and rarely uses the walker within the house.  But is not confused during the day.  He has some word finding troubles. daughter states that wakes up in the middle of the night confused.  On seroquel for sleep.  Having trouble reading - vision too blurry to read.    11/19/17 update: Patient is seen today in follow-up for essential tremor and for turning on his DBS.   Pt is accompanied by his grandson and wife who supplement the history.   He is still on primidone, 50 mg twice per day. Also on trazodone 50 mg q hs for sleep but still has some sleep trouble.  Much has happened since our last visit.  Numerous records have been reviewed.  Patient was in the hospital in April with Pseudomonas epididymitis.  He has also been treated for multiple urinary tract infections since last visit and has also been treated by pulmonary for cough.  He was finally able to have his battery placed on November 13, 2017.  Dr. Fredrich BirksStern's nurse contacted me this morning and stated that the patient had a few falls on Friday and some confusion.  He had trouble manipulating the walker.  However, this resolved over the weekend.  When asked about this today, confusion is denied by pt/wife but stated that he had episode of "jerking" and that caused him to let go of/throw the walker and he fell because of that.   Feeling good. Wife states that he is not 100% physically back to where he was before surgery but mentally he is.  Has some word finding trouble but had that issue prior to sx per wife.  11/26/17 update: Patient is seen today for follow-up essential tremor, status post activation of DBS device 1 week ago.  Patient is seen accompanied by his wife who supplements the history.  He remains on primidone, 50 mg twice per day.  He reports that tremor has been improved over the last week but still present.  Wife is not sure that the stimulator is really helping, but the patient states it is some.  Tremor seems to come and go.  No falls since last visit.  Saw Dr. Venetia MaxonStern and he will take staples  out this Friday.  Wife asks me to show her how to use patient programmer again.  She also asks me to look at his left foot as he felt quite some time ago (prior to last visit) and has a wound on the foot.  01/07/18 update: Patient is seen today in follow-up for essential tremor.  Patient remains on primidone, 50 mg twice per day.  Reports that tremor has been good until a recent fall and noted a little more tremor since then.  Now on robaxin by PMR.  Still on gabapentin.   Fell last Monday.  Was coming out of physician office and fell off curb outside.  No other falls.  06/11/18 update: Patient is seen today in follow-up for essential tremor.  Last visit, I discontinued his primidone.  Reports that he did fine with that.  I also decreased his gabapentin because he had previously been having myoclonus.  I also decreased it as I was not sure he really needed it.  He is doing well with that.  He continues to follow with physiatry.  He last saw him on January 17.  I have been continuing Robaxin, 500 mg once per day.  they increased his trazodone to 100 mg at night for sleep but it looks like they called the PCP for the refill and she was unaware of the increase by physiatry so he is still on 50 mg daily.  They ask me about that today.   His memory has been good.  His wife agrees with that.  In fact, he is doing most of the driving.  His wife states that his driving is very good.  The DMV contacted him since our last visit and he had to take a driving evaluation yesterday.  There apparently some concerns about memory change, although patient/wife do not know where that was driven from.  They really do not have the same concerns.  Wife has noticed occasional word finding troubles, but otherwise does not notice issues.  He does not know the results of that.  He was told that they would contact him from [place] within the next week.  His balance has been much better.  He has not had any falls.    12/06/18 update: Patient seen today for his tremor.  His wife called at the end of July, when I was on vacation, stating that the patient had increasing tremor.   It was only one day per pts wife.   one of my partners restarted his primidone, 50 mg nightly.  His wife just called recently and stated that he had another tremulous episode.  It does not sound as if tremor was constant, but she wanted me to look at the DBS device, which was appropriate, so he came in today.  Wife states today that the second day of tremor occurred on day that fever was 101 and turned out that he had UTI.  He did get a treatment.  He did fall the night of the fever.    Allergies  Allergen Reactions  . Lisinopril Cough    Outpatient Encounter Medications as of 12/06/2018  Medication Sig  . aspirin EC 81 MG tablet Take 81 mg by mouth daily.  [name] atorvastatin (LIPITOR) 40 MG tablet TAKE 1 TABLET BY MOUTH DAILY  . candesartan (ATACAND) 32 MG tablet TAKE 1 TABLET BY MOUTH DAILY  . fluticasone (FLONASE) 50 MCG/ACT nasal spray USE 2 SPRAYS IN BOTH NOSTRILS DAILY  . furosemide (LASIX) 20 MG tablet  TAKE 1 TABLET BY MOUTH DAILY, TAKE AN EXTRA DOSE IF NEEDED  . gabapentin (NEURONTIN) 300 MG capsule TAKE 1 CAPSULE BY MOUTH EACH NIGHT AT BEDTIME  . ketoconazole (NIZORAL) 2 % shampoo  Apply 1 application topically once a week.  . loratadine (ALLERGY RELIEF) 10 MG tablet Take 1 tablet (10 mg total) by mouth daily.  . Melatonin 3 MG TABS Take 1 tablet (3 mg total) by mouth at bedtime.  . methocarbamol (ROBAXIN) 750 MG tablet TAKE 1 TABLET BY MOUTH TWICE DAILY AS NEEDED FOR MUSCLE SPASMS  . nitroGLYCERIN (NITROSTAT) 0.4 MG SL tablet Place 1 tablet (0.4 mg total) under the tongue every 5 (five) minutes as needed for chest pain. call 911 if chest pain not better  . omeprazole (PRILOSEC) 20 MG capsule Take 1 capsule (20 mg total) by mouth daily as needed (acid reflux).  . primidone (MYSOLINE) 50 MG tablet Take 1 tablet (50 mg total) by mouth at bedtime.  [name] PROAIR HFA 108 (90 Base) MCG/ACT inhaler INHALE 2 PUFFS INTO THE LUNGS 4 TIMES DAILY AS NEEDED  . propranolol (INDERAL) 40 MG tablet TAKE 1 TABLET BY MOUTH TWICE DAILY  . Propylene Glycol (SYSTANE BALANCE) 0.6 % SOLN Place 1 drop into both eyes daily as needed (for dry eyes).  . tamsulosin (FLOMAX) 0.4 MG CAPS capsule Take 1 capsule (0.4 mg total) by mouth daily after supper.  . traMADol (ULTRAM) 50 MG tablet Take 1 tablet (50 mg total) by mouth every 6 (six) hours as needed for moderate pain.  . traZODone (DESYREL) 50 MG tablet TAKE 1 TABLET BY MOUTH EACH NIGHT AT BEDTIME   Facility-Administered Encounter Medications as of 12/06/2018  Medication  . 0.9 %  sodium chloride infusion  . sodium chloride 0.9 % injection 3 mL    Past Medical History:  Diagnosis Date  . Anxiety disorder   . Benign enlargement of prostate   . CAD in native artery 1997   Referred for CABG x3 in 2004 for LAD diagonal bifurcation lesion  . Cervical spinal stenosis   . Complex regional pain syndrome of right upper extremity    Right wrist; L arm  . Coronary atherosclerosis of artery bypass graft June 2013   Occluded SVG-D1; Cardiologist Dr. Herbie Baltimore  . Gait disorder   . Gastroesophageal reflux disease   . Glaucoma   . Hiatal hernia    GI: Dr  Evette Cristal  . Hyperlipidemia LDL goal <70   . Hypertension   . Memory loss    Mild  . Obesity   . Peripheral neuropathy    possible peripheral neuropathy  . S/P CABG x 3 2004    LIMA-LAD, SVG to diagonal, SVG to OM  . Tremor, essential    On Primidone  . Unstable angina pectoris [facility])  June 2013   Cardiac cath: occluded SVG-DI. Patent LIMA-LAD and SVG-OM. EF 45% with apical inferior HK.    Past Surgical History:  Procedure Laterality Date  . CARDIAC CATHETERIZATION    . CATARACT EXTRACTION, BILATERAL    . CORONARY ARTERY BYPASS GRAFT  06/2002   LIMA-LAD, SVG-D1, SVG-OM  . EYE SURGERY    . KNEE SURGERY Left   . LEFT HEART CATHETERIZATION WITH CORONARY/GRAFT ANGIOGRAM N/A 10/13/2011   Procedure: LEFT HEART CATHETERIZATION WITH Isabel Caprice;  Surgeon: Marykay Lex, MD;  Location[facility] CATH LAB;  Service: Cardiovascular;  Laterality: N/A;  . MINOR PLACEMENT OF FIDUCIAL N/A 04/26/2017   Procedure: Fiducial placement;  Surgeon: Maeola Harman, MD;  Location: [facility] OR;  Service: Neurosurgery;  Laterality: N/A;  Fiducial placement  . NM MYOCAR PERF WALL MOTION  03/23/2009   protocol:Bruce, normal perfusion in all regions, post-stress EF 72%, exercise capacity 7METS, EKG negative for ischemia.  [name]. PULSE GENERATOR IMPLANT Bilateral 11/13/2017   Procedure: Bilateral Implantable pulse generator;  Surgeon: Maeola HarmanStern, Joseph, MD;  [facility];  Service: Neurosurgery;  Laterality: Bilateral;  Bilateral Implantable pulse generator  . Shoulder Orthoscopic Surgery   06/2003  . SUBTHALAMIC STIMULATOR INSERTION Bilateral 05/04/2017   Procedure: Bilateral Deep brain stimulator placement;  Surgeon: Maeola HarmanStern, Joseph, MD;  [facility];  Service: Neurosurgery;  Laterality: Bilateral;  Bilateral deep brain stimulator placement  . TRANSTHORACIC ECHOCARDIOGRAM  04/2017   Jan 2019: Normal LV size and function.  EF 55-60%.  No or W MA.  Mild LA dilation.  . wrist ganglion cyst Left     Social History    Socioeconomic History  . Marital status: Married    Spouse name: [name]  . Number of children: 2  . Years of education: 10  . Highest education level: 10th grade  Occupational History  . 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 Financial resource strain: Not hard at all  . Food insecurity    Worry: Never true    Inability: Never true  . Transportation needs    Medical: No    Non-medical: No  Tobacco Use  . Smoking status: Former Smoker    Packs/day: 3.00    Years: 20.00    Pack years: 60.00    Types: Cigarettes    Quit date: 04/17/1968    Years since quitting: 50.6  . Smokeless tobacco: Never Used  Substance and Sexual Activity  . Alcohol use: No    Frequency: Never  . Drug use: No  . Sexual activity: Never  Lifestyle  . Physical activity    Days per week: 0 days    Minutes per session: 0 min  . Stress: Only a little  Relationships  . Social connections    Talks on phone: More than three times a week    Gets together: Three times a week    Attends religious service: Patient refused    Active member of club or organization: Patient refused    Attends meetings of clubs or organizations: Patient refused    Relationship status: Married  . Intimate partner violence    Fear of current or ex partner: No    Emotionally abused: No    Physically abused: No    Forced sexual activity: No  Other Topics Concern  . Not on file  Social History Narrative   Married.  Wife is Royal PiedraMary Tayler.   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   Former smoker, quit 20 years ago. Does not drink alcohol.   Does not get routine exercise, walks sometimes with a cane.   Caffeine use: diet-soda   Left handed     Family Status  Relation Name Status  . Mother  Deceased at age 482       CHF  . Father  Deceased at age 76       following a fall  . Sister  Alive  . Brother  Alive  . [employer] Uncle  Alive  . Brother  Alive       pt. has  four brothers  . Sister  Alive  . Daughter  Alive  . Daughter  Alive  . Brother  Alive  . Brother  Alive    Review of Systems Review of Systems  Constitutional: Positive for malaise/fatigue.  HENT: Negative.   Eyes: Negative.   Respiratory: Negative.   Cardiovascular: Negative.   Gastrointestinal: Negative.   Musculoskeletal: Negative.   Skin: Negative.       Objective:   VITALS:   Vitals:   12/06/18 0944  BP: (!) 158/69  Pulse: (!) 56  SpO2: 97%  Weight: 187 lb (84.8 kg)  Height: 5\' 7"  (1.702 m)   GEN:  The patient appears stated age and is in NAD. HEENT:  Normocephalic, atraumatic.  The mucous membranes are moist. The superficial temporal arteries are without ropiness or tenderness. CV: Regular rate rhythm Lungs: Clear to auscultation bilaterally Neck/HEME: No carotid bruits   Neurological examination:  Orientation:  Montreal Cognitive Assessment  06/11/2018 06/11/2018  Visuospatial/ Executive (0/5) 3 3  Naming (0/3) 2 2  Attention: Read list of digits (0/2) 1 1  Attention: Read list of letters (0/1) 1 1  Attention: Serial 7 subtraction starting at 100 (0/3) 3 3  Language: Repeat phrase (0/2) 0 0  Language : Fluency (0/1) 0 0  Abstraction (0/2) 0 0  Delayed Recall (0/5) 0 0  Orientation (0/6) 6 6  Total 16 16  Adjusted Score (based on education) 17 17   Cranial nerves: There is good facial symmetry. The speech is fluent and clear. Soft palate rises symmetrically and there is no tongue deviation. Hearing is intact to conversational tone. Sensation: Sensation is intact to light touch throughout Motor: Strength is 5/5 in the bilateral upper and lower extremities.  Movement examination: Tone: There is normal tone in the UE/LE Abnormal movements: Prior programming today, there was mild tremor of the RUE and very little noted on the left Coordination:  There is no decremation with RAM's, with any form of RAMS, including alternating supination and pronation of  the forearm, hand opening and closing, finger taps, heel taps and toe taps. Gait and Station: The patient pushes off of the chair to arise.  He is wide-based, but walks well down the hall.  He does not use any ambulatory assistive device today.   labs: Lab Results  Component Value Date   HGBA1C 5.8 (H) 05/16/2017     Chemistry      Component Value Date/Time   NA 142 04/23/2018 1453   K 4.8 04/23/2018 1453   CL 101 04/23/2018 1453   CO2 25 04/23/2018 1453   BUN 9 04/23/2018 1453   CREATININE 1.03 04/23/2018 1453   CREATININE 1.02 05/01/2016 1118      Component Value Date/Time   CALCIUM 9.5 04/23/2018 1453   ALKPHOS 169 (H) 04/23/2018 1453   AST 18 04/23/2018 1453   ALT 13 04/23/2018 1453   BILITOT 1.7 (H) 04/23/2018 1453     DBS programming was performed today which is described in more detail on a separate programming procedure note.     Assessment/Plan:   1.  Essential Tremor.  -Patient is status post bilateral DBS to the bilateral VIM on May 04, 2017.  He suffered a postoperative bleed on the left, with resultant hematoma causing initial paralysis of the right hemi-soma and then mild paresis on the right.  This has since completely resolved.  Because of these complications, along with multiple other infections, the patient's battery was not placed until November 13, 2017.    -d/c primidone  2.  Possible myoclonus  -This resolved once  we decreased gabapentin.  He is only on 300 mg at night now.  He may be able to get off of this in the future.  3.  Insomnia  -Physiatry had increased his trazodone to 100 mg at night, but he called his primary care for refill and she was unaware that.  I told him to go ahead and call Dr. Posey Pronto for his refill so that he can get the prescribed dosage.  4.  Memory change  -Moca was 17, but the patient did very well on Trail making, which will often predict driving.  He was also fully oriented and did well on his clock drawing.  He did well with  serial sevens.  He had trouble with delayed recall and naming words with the letter "F."  I am not sure that he fully meets criteria for dementia at this point in time.  He has had a driving evaluation.  I do think that his cerebral hemorrhage affected him, but I have only seen improvement in memory and cognition over the course of time.  Safety discussed.  5.  Follow-up 6 months, sooner should new neurologic issues arise.   CC:  Autry-Lott, Naaman Plummer, DO

## 2018-12-06 ENCOUNTER — Encounter: Payer: Self-pay | Admitting: Neurology

## 2018-12-06 ENCOUNTER — Ambulatory Visit (INDEPENDENT_AMBULATORY_CARE_PROVIDER_SITE_OTHER): Payer: PPO | Admitting: Neurology

## 2018-12-06 ENCOUNTER — Other Ambulatory Visit: Payer: Self-pay

## 2018-12-06 VITALS — BP 158/69 | HR 56 | Ht 67.0 in | Wt 187.0 lb

## 2018-12-06 DIAGNOSIS — Z9689 Presence of other specified functional implants: Secondary | ICD-10-CM | POA: Diagnosis not present

## 2018-12-06 DIAGNOSIS — G250 Essential tremor: Secondary | ICD-10-CM

## 2018-12-06 NOTE — Procedures (Signed)
DBS Programming was performed.    Type of device:  Medtronic  Total time spent programming was 20 minutes.  Device was on.  Soft start was confirmed to be on.  Impedences were checked and were within normal limits.  Battery was checked and was determined to be functioning normally and not near the end of life (2.98 on both sides -  Same as previous).  Final settings were as follows:  Left brain electrode:     1-0+           ; Amplitude  2.5  V   ; Pulse width 90 microseconds;   Frequency   160   Hz.  Right brain electrode:     1-0+          ; Amplitude   2.7  V ;  Pulse width 90  microseconds;  Frequency   150    Hz.

## 2018-12-06 NOTE — Patient Instructions (Signed)
1.  Stop primidone

## 2019-01-03 ENCOUNTER — Other Ambulatory Visit: Payer: Self-pay

## 2019-01-03 ENCOUNTER — Ambulatory Visit (INDEPENDENT_AMBULATORY_CARE_PROVIDER_SITE_OTHER): Payer: PPO | Admitting: Family Medicine

## 2019-01-03 ENCOUNTER — Ambulatory Visit: Payer: PPO | Admitting: Family Medicine

## 2019-01-03 VITALS — BP 120/60 | HR 63 | Wt 179.6 lb

## 2019-01-03 DIAGNOSIS — E875 Hyperkalemia: Secondary | ICD-10-CM | POA: Diagnosis not present

## 2019-01-03 DIAGNOSIS — G629 Polyneuropathy, unspecified: Secondary | ICD-10-CM | POA: Diagnosis not present

## 2019-01-03 DIAGNOSIS — E119 Type 2 diabetes mellitus without complications: Secondary | ICD-10-CM | POA: Diagnosis not present

## 2019-01-03 DIAGNOSIS — R7303 Prediabetes: Secondary | ICD-10-CM | POA: Diagnosis not present

## 2019-01-03 DIAGNOSIS — Z23 Encounter for immunization: Secondary | ICD-10-CM | POA: Diagnosis not present

## 2019-01-03 DIAGNOSIS — I10 Essential (primary) hypertension: Secondary | ICD-10-CM | POA: Diagnosis not present

## 2019-01-03 LAB — POCT GLYCOSYLATED HEMOGLOBIN (HGB A1C): HbA1c, POC (controlled diabetic range): 7 % (ref 0.0–7.0)

## 2019-01-03 MED ORDER — TRAMADOL HCL 50 MG PO TABS: 50.0000 mg | ORAL_TABLET | Freq: Four times a day (QID) | ORAL | 0 refills | Status: DC | PRN

## 2019-01-03 MED ORDER — METHOCARBAMOL 750 MG PO TABS: ORAL_TABLET | ORAL | 2 refills | Status: DC

## 2019-01-03 NOTE — Progress Notes (Signed)
Subjective:  Brendan [name] is a 78 y.o. male who [facility] today with a chief complaint of medication refill and establish care.   HPI: Patient is unable to have his establishing visit with his PCP because she was unable to be here today.  I discussed that I will address medication refills and health maintenance as possible.  Need for immunization against influenza Patient consents to flu shot, no symptoms at this time  Diabetes mellitus without complication [facility](HCC) Patient says he is prediabetic, diet controlled with no medications at this time.  Does not check sugars.  Benign essential HTN Currently well controlled 120/60, given history will check BMP.  No complaints of chest pain, shortness of breath, headaches or neurological changes.  Neuropathy No current changes in complaints, patient has been taking tramadol and Robaxin for pain.  We discussed that these are not generally great nerve pain medications but patient would like to continue at this time.  He can discuss with PCP.  Objective:  Physical Exam: BP 120/60   Pulse 63   Wt 179 lb 9.6 oz (81.5 kg)   SpO2 99%   BMI 28.13 kg/m   Gen: NAD, resting comfortably CV: RRR with no murmurs appreciated Pulm: NWOB, CTAB with no crackles, wheezes, or rhonchi GI: Normal bowel sounds present. Soft, Nontender, Nondistended. MSK: no edema, cyanosis, or clubbing noted Skin: warm, dry Psych: Normal affect and thought content  Results for orders placed or performed in visit on 01/03/19 (from the past 72 hour(s))  HgB A1c     Status: None   Collection Time: 01/03/19  2:15 PM  Result Value Ref Range   Hemoglobin A1C     HbA1c POC (<> result, manual entry)     HbA1c, POC (prediabetic range)     HbA1c, POC (controlled diabetic range) 7.0 0.0 - 7.0 %  Basic Metabolic Panel     Status: Abnormal   Collection Time: 01/03/19  2:23 PM  Result Value Ref Range   Glucose 147 (H) 65 - 99 mg/dL   BUN 11 8 - 27 mg/dL   Creatinine, Ser 1.611.14 0.76 - 1.27 mg/dL   GFR calc non Af Amer 62 >59 mL/min/1.73   GFR calc Af Amer 72 >59 mL/min/1.73   BUN/Creatinine Ratio 10 10 - 24   Sodium 141 134 - 144 mmol/L   Potassium 5.4 (H) 3.5 - 5.2 mmol/L   Chloride 103 96 - 106 mmol/L   CO2 23 20 - 29 mmol/L   Calcium 9.5 8.6 - 10.2 mg/dL     Assessment/Plan:  Need for immunization against influenza Patient consents to flu shot, no symptoms at this time  Diabetes mellitus without complication (HCC) A1c now at 7.0 up from 5.8.  This could still be within goal given patient's age.  We did not have time to discuss this during this visit so patient will be scheduled to see his PCP to discuss plan moving forward.  Benign essential HTN Currently well controlled 120/60, given history will check BMP which was notable only for mild hyperkalemia to 5.4.  It is possible that his ARB is contributing to this, called his wife and asked him to stop taking this until he can see his PCP.  She will continue to check his blood pressures at home, if they start going over 130/80 they will call into the office and get a new prescription for something else (potentially thiazide).  Follow-up with his PCP as scheduled on October 20.  Neuropathy No current changes in complaints, patient has been taking tramadol and Robaxin for pain.  We discussed that these are not generally great nerve pain medications but patient would like to continue at this time.  He can discuss with PCP.  Hyperkalemia Hyperkalemia to 5.4 on BMP.  Medication review shows that losartan may be possible contributor.   We will suggest that he discuss change in medication with his primary care doctor.  I have instructed them to stop the candesartan and check blood pressures daily.  If they start going over 130/80 they can call in and get a phone prescription for a new medication until he sees his PCP on October 20. (Likely a thiazide)   Sherene Sires, DO FAMILY MEDICINE RESIDENT -  PGY3 01/06/2019 7:05 AM

## 2019-01-03 NOTE — Patient Instructions (Signed)
It was a pleasure to see you today! Thank you for choosing [facility] for your primary care. Brendan [name] was seen for blood pressure. Come back to the clinic in a few months to see Dr. Marylene Buerger.   Today we talked with your blood pressure it seems to be doing well, we are not can make any medication changes there.  We are going to get a blood test to check how your kidneys are doing with your blood pressure.  We are also checking your A1c which should tell you how you are doing with your pre-diabetes  We talked about some ways to reduce the amount of time she wake up to go to the bathroom at night by reducing the amount of fluid you drink right before you go to sleep.  Were not going to change medication for that and do not think it would be appropriate to increase her trazodone at this time.  We also talked about your flu vaccine, pneumonia vaccine, shingles vaccine.   Please bring all your medications to every doctors visit   Sign up for My Chart to have easy access to your labs results, and communication with your Primary care physician.     Please check-out at the front desk before leaving the clinic.     Best,  Dr. Sherene Sires FAMILY MEDICINE RESIDENT - PGY3

## 2019-01-04 LAB — BASIC METABOLIC PANEL
BUN/Creatinine Ratio: 10 (ref 10–24)
BUN: 11 mg/dL (ref 8–27)
CO2: 23 mmol/L (ref 20–29)
Calcium: 9.5 mg/dL (ref 8.6–10.2)
Chloride: 103 mmol/L (ref 96–106)
Creatinine, Ser: 1.14 mg/dL (ref 0.76–1.27)
GFR calc Af Amer: 72 mL/min/{1.73_m2} (ref >59)
GFR calc non Af Amer: 62 mL/min/{1.73_m2} (ref >59)
Glucose: 147 mg/dL — ABNORMAL HIGH (ref 65–99)
Potassium: 5.4 mmol/L — ABNORMAL HIGH (ref 3.5–5.2)
Sodium: 141 mmol/L (ref 134–144)

## 2019-01-06 DIAGNOSIS — Z23 Encounter for immunization: Secondary | ICD-10-CM | POA: Insufficient documentation

## 2019-01-06 DIAGNOSIS — G629 Polyneuropathy, unspecified: Secondary | ICD-10-CM | POA: Insufficient documentation

## 2019-01-06 DIAGNOSIS — E875 Hyperkalemia: Secondary | ICD-10-CM | POA: Insufficient documentation

## 2019-01-06 NOTE — Assessment & Plan Note (Signed)
Patient consents to flu shot, no symptoms at this time 

## 2019-01-06 NOTE — Assessment & Plan Note (Addendum)
Hyperkalemia to 5.4 on BMP.  Medication review shows that losartan may be possible contributor.   We will suggest that he discuss change in medication with his primary care doctor.  I have instructed them to stop the candesartan and check blood pressures daily.  If they start going over 130/80 they can call in and get a phone prescription for a new medication until he sees his PCP on October 20. (Likely a thiazide)

## 2019-01-06 NOTE — Assessment & Plan Note (Signed)
No current changes in complaints, patient has been taking tramadol and Robaxin for pain.  We discussed that these are not generally great nerve pain medications but patient would like to continue at this time.  He can discuss with PCP.

## 2019-01-06 NOTE — Assessment & Plan Note (Addendum)
Currently well controlled 120/60, given history will check BMP which was notable only for mild hyperkalemia to 5.4.  It is possible that his ARB is contributing to this, called his wife and asked him to stop taking this until he can see his PCP.  She will continue to check his blood pressures at home, if they start going over 130/80 they will call into the office and get a new prescription for something else (potentially thiazide).  Follow-up with his PCP as scheduled on October 20.

## 2019-01-06 NOTE — Assessment & Plan Note (Signed)
A1c now at 7.0 up from 5.8.  This could still be within goal given patient's age.  We did not have time to discuss this during this visit so patient will be scheduled to see his PCP to discuss plan moving forward.

## 2019-01-09 ENCOUNTER — Encounter: Payer: PPO | Admitting: Neurology

## 2019-01-10 ENCOUNTER — Telehealth: Payer: Self-pay | Admitting: Cardiology

## 2019-01-10 NOTE — Telephone Encounter (Signed)
Returned call to wife (DPR) who is calling about candesartan. She reports his PCP told him that his potassium levels were "off"  - 5.4 per epic -- she wanted to know if candesartan could be causing this. He has been OFF candesartan since last week (BP readings of the medication: 104/55, 109/52, 96/51, 117/53, 115/58). He does eat a lot of bananas and potatoes per wife.   Will route to MD to review and advise

## 2019-01-10 NOTE — Telephone Encounter (Signed)
Yes, candesartan and medication like that do have a tendency to have potassium levels go up.  Based on the fact that his blood pressures look great off of candesartan.  I think we can just simply stay off it.  Bananas and potatoes also have less potassium so would probably hold off on eating those until the potassium level comes down.

## 2019-01-10 NOTE — Telephone Encounter (Signed)
New message    Pt c/o medication issue:  1. Name of Medication:candesartan (ATACAND) 32 MG tablet  2. How are you currently taking this medication (dosage and times per day)? 1 tablet by mouth daily  3. Are you having a reaction (difficulty breathing--STAT)? n/a  4. What is your medication issue? Per patient's wife wants to know if he should go off this medication? Please call.

## 2019-01-10 NOTE — Telephone Encounter (Signed)
Wife called w/MD advice/recommendation. She voiced understanding. Med list updated.

## 2019-01-20 ENCOUNTER — Other Ambulatory Visit: Payer: Self-pay

## 2019-01-21 MED ORDER — TRAZODONE HCL 50 MG PO TABS: ORAL_TABLET | ORAL | 3 refills | Status: DC

## 2019-01-27 ENCOUNTER — Other Ambulatory Visit: Payer: Self-pay | Admitting: Cardiology

## 2019-02-04 ENCOUNTER — Encounter: Payer: Self-pay | Admitting: Family Medicine

## 2019-02-04 ENCOUNTER — Ambulatory Visit (INDEPENDENT_AMBULATORY_CARE_PROVIDER_SITE_OTHER): Payer: PPO | Admitting: Family Medicine

## 2019-02-04 ENCOUNTER — Other Ambulatory Visit: Payer: Self-pay

## 2019-02-04 VITALS — BP 142/62 | HR 55 | Wt 183.8 lb

## 2019-02-04 DIAGNOSIS — E875 Hyperkalemia: Secondary | ICD-10-CM

## 2019-02-04 DIAGNOSIS — I10 Essential (primary) hypertension: Secondary | ICD-10-CM

## 2019-02-04 DIAGNOSIS — E119 Type 2 diabetes mellitus without complications: Secondary | ICD-10-CM

## 2019-02-04 NOTE — Progress Notes (Signed)
Subjective:    Patient ID: Brendan [name], male    DOB: [birth year], 78 y.o.   MRN: [id number]   CC: Blood pressure medication change due to hyperkalemia, and to discuss new onset diabetes.  HPI: Brendan [name] presents today for a follow-up visit on his hyperkalemia on basic metabolic panel 1 month prior.  This was believed to be drug-induced from taking an ARB and since then he has stopped taking 32 mg of candesartan and asked to keep a blood pressure log.  Today in office his blood pressure is 142/62, at home his wife has been taking his blood pressure log and says that it ranges from 112-115/50-60.  He denies any muscle weakness or palpitations.  Brendan [name] A1c last visit 1 month ago was 7.0 which was up from 5.8 he does endorse polyuria but can also be associated with Lasix medication.  He also has paresthesias of the hands and feet she attributes to be associated with prior deep brain stimulation operation for tremor.  Denies polyphagia or changes in vision.  Brendan [name] wife said he has had a ophthalmology exam this year and will have office fax papers to the office. Brendan [name] drinks 3 diet Dr. Alcus Dad a day.   Smoking status reviewed  Review of Systems Per HPI, also denies recent illness, fever, headache, changes in vision, chest pain, shortness of breath, abdominal pain, N/V/D, weakness   Patient Active Problem List   Diagnosis Date Noted  . Neuropathy 01/06/2019  . Hyperkalemia 01/06/2019  . Benign essential HTN 05/03/2018  . Abrasion of right heel 11/28/2017  . Tremor 11/13/2017  . Scrotal pain 07/17/2017  . Hydrocele, bilateral 07/17/2017  . Scrotal wall edema 07/17/2017  . Anemia 07/17/2017  . S/P deep brain stimulator placement   . Epididymitis   . Sepsis (HCC) 07/16/2017  . Preoperative cardiovascular examination 07/02/2017  . Labile blood pressure   . Spastic hemiparesis affecting dominant side (HCC)   . Thrombocytopenia (HCC)   . Agitation   . Labile blood  glucose   . History of Enterococcus UTI 05/29/2017  . Neurogenic bladder   . AKI (acute kidney injury) (HCC)   . Hemiplegia (HCC)   . Sleep disturbance   . Confusion, postoperative   . Hypotension due to drugs   . Acute blood loss anemia   . Diabetes mellitus without complication (HCC)   . Dysphagia   . Intraparenchymal hemorrhage of brain (HCC) 05/18/2017  . Right sided weakness   . Aphasia   . Intracerebral hemorrhage 05/16/2017  . Cerebral edema (HCC)   . Weakness 05/15/2017  . Atherosclerosis of native coronary artery of native heart with angina pectoris (HCC)   . Glaucoma   . Anxiety state   . Cognitive disorder   . Urinary retention   . Traumatic hemorrhage of left cerebrum without loss of consciousness (HCC)   . Pressure injury of skin 05/05/2017  . Seborrheic dermatitis of scalp 10/05/2016  . CHRONIC STABLE ANGINA 08/06/2013  . Lower extremity edema 01/30/2013  . S/P CABG x 3   . Hyperlipidemia LDL goal <70   . Memory problem 07/01/2012  . RSD (reflex sympathetic dystrophy) 05/24/2012  . Asbestosis (HCC) 05/15/2012  . Anemia, B12 deficiency 03/19/2012  . Atherosclerotic heart disease of artery bypass graft 09/16/2011  . BPH (benign prostatic hyperplasia) 08/08/2011  . Microscopic hematuria 02/27/2011  . Anxiety 10/03/2010  . Incontinence 07/21/2010  . GERD 10/28/2007  . Essential tremor 08/30/2006  . Essential hypertension 07/06/2006  Objective:  BP (!) 142/62   Pulse (!) 55   Wt 183 lb 12.8 oz (83.4 kg)   SpO2 97%   BMI 28.79 kg/m  Vitals and nursing note reviewed  General: NAD, pleasant Cardiac: RRR, normal heart sounds, no murmurs Respiratory: CTAB, normal effort Abdomen: soft, nontender, nondistended Extremities: no edema or cyanosis. WWP. Skin: warm and dry, no rashes noted Neuro: alert and oriented, no focal deficits Psych: normal affect  Diabetic Foot Exam - Simple   Simple Foot Form Diabetic Foot exam was performed with the following  findings: Yes 02/04/2019  3:53 PM  Visual Inspection No deformities, no ulcerations, no other skin breakdown bilaterally: Yes Sensation Testing Intact to touch and monofilament testing bilaterally: Yes Pulse Check Posterior Tibialis and Dorsalis pulse intact bilaterally: Yes Comments     Assessment & Plan:    Diabetes mellitus without complication ([place]) New onset. Visit from 1 month ago A1c 7.0, prior was 5.8. His A1c goal is <7.5. I think he would benefit from dietary changes, repeat A1c in 2 months and discuss medicinal management if necessary.  -Lifestyle and dietary changes discussed. Handout given -Diabetic foot exam today -Urine microalbumin -Opthalmology exam from this year; records to be faxed to office -Return in 2 months for repeat A1c   Essential hypertension Well controlled. Stopped candesartan 1 month ago due to hyperkalemia (5.4). Has cardiologist that agrees with stopping this medication. JNC 8 BP goal for this patient is <150/90. He is within in goal with home BP log and office BP today. No additional medication needed at this time. -Continue Lasix 20mg  -Continue home BP log -F/u PRN  Hyperkalemia Asymptomatic. Believed to be drug induced. Offending agent (candesartan) stopped. BMP 01/03/2019 K+ 5.4, Cr 1.14 (baseline 1.03-1.20) BUN 11 GFR 62. Today we will repeat BMP to check potassium levels to check for a decline with removal of the medication. Patient is on a loop diuretic and this will help the body with potassium removal.  -BMP -Dietary precautions. Handout given.  -Continue lasix 20mg  -f/u PRN or sooner if symptoms arise.     Gerlene Fee, [facility] PGY-1

## 2019-02-04 NOTE — Assessment & Plan Note (Addendum)
New onset. Visit from 1 month ago A1c 7.0, prior was 5.8. His A1c goal is <7.5. I think he would benefit from dietary changes, repeat A1c in 2 months and discuss medicinal management if necessary.  -Lifestyle and dietary changes discussed. Handout given -Diabetic foot exam today -Urine microalbumin -Opthalmology exam from this year; records to be faxed to office -Return in 2 months for repeat A1c

## 2019-02-04 NOTE — Patient Instructions (Addendum)
It was very nice to meet you today. Please enjoy the rest of your week. Today you were seen for your blood pressure and your goal is <150/90, new onset diabetes and your A1c goal is <7.0, and increased potassium. You had a blood draw and urinalysis. We will call you with results. Follow up in 3 months for repeat A1c or sooner if needed.   Please call the clinic at [phone] if your symptoms worsen or you have any concerns. It was our pleasure to serve you.  Diabetes Mellitus and Nutrition, Adult When you have diabetes (diabetes mellitus), it is very important to have healthy eating habits because your blood sugar (glucose) levels are greatly affected by what you eat and drink. Eating healthy foods in the appropriate amounts, at about the same times every day, can help you:  Control your blood glucose.  Lower your risk of heart disease.  Improve your blood pressure.  Reach or maintain a healthy weight. Every person with diabetes is different, and each person has different needs for a meal plan. Your health care provider may recommend that you work with a diet and nutrition specialist (dietitian) to make a meal plan that is best for you. Your meal plan may vary depending on factors such as:  The calories you need.  The medicines you take.  Your weight.  Your blood glucose, blood pressure, and cholesterol levels.  Your activity level.  Other health conditions you have, such as heart or kidney disease. How do carbohydrates affect me? Carbohydrates, also called carbs, affect your blood glucose level more than any other type of food. Eating carbs naturally raises the amount of glucose in your blood. Carb counting is a method for keeping track of how many carbs you eat. Counting carbs is important to keep your blood glucose at a healthy level, especially if you use insulin or take certain oral diabetes medicines. It is important to know how many carbs you can safely have in each meal. This  is different for every person. Your dietitian can help you calculate how many carbs you should have at each meal and for each snack. Foods that contain carbs include:  Bread, cereal, rice, pasta, and crackers.  Potatoes and corn.  Peas, beans, and lentils.  Milk and yogurt.  Fruit and juice.  Desserts, such as cakes, cookies, ice cream, and candy. How does alcohol affect me? Alcohol can cause a sudden decrease in blood glucose (hypoglycemia), especially if you use insulin or take certain oral diabetes medicines. Hypoglycemia can be a life-threatening condition. Symptoms of hypoglycemia (sleepiness, dizziness, and confusion) are similar to symptoms of having too much alcohol. If your health care provider says that alcohol is safe for you, follow these guidelines:  Limit alcohol intake to no more than 1 drink per day for nonpregnant women and 2 drinks per day for men. One drink equals 12 oz of beer, 5 oz of wine, or 1 oz of hard liquor.  Do not drink on an empty stomach.  Keep yourself hydrated with water, diet soda, or unsweetened iced tea.  Keep in mind that regular soda, juice, and other mixers may contain a lot of sugar and must be counted as carbs. What are tips for following this plan?  Reading food labels  Start by checking the serving size on the "Nutrition Facts" label of packaged foods and drinks. The amount of calories, carbs, fats, and other nutrients listed on the label is based on one serving of the item.  Many items contain more than one serving per package.  Check the total grams (g) of carbs in one serving. You can calculate the number of servings of carbs in one serving by dividing the total carbs by 15. For example, if a food has 30 g of total carbs, it would be equal to 2 servings of carbs.  Check the number of grams (g) of saturated and trans fats in one serving. Choose foods that have low or no amount of these fats.  Check the number of milligrams (mg) of salt  (sodium) in one serving. Most people should limit total sodium intake to less than 2,300 mg per day.  Always check the nutrition information of foods labeled as "low-fat" or "nonfat". These foods may be higher in added sugar or refined carbs and should be avoided.  Talk to your dietitian to identify your daily goals for nutrients listed on the label. Shopping  Avoid buying canned, premade, or processed foods. These foods tend to be high in fat, sodium, and added sugar.  Shop around the outside edge of the grocery store. This includes fresh fruits and vegetables, bulk grains, fresh meats, and fresh dairy. Cooking  Use low-heat cooking methods, such as baking, instead of high-heat cooking methods like deep frying.  Cook using healthy oils, such as olive, canola, or sunflower oil.  Avoid cooking with butter, cream, or high-fat meats. Meal planning  Eat meals and snacks regularly, preferably at the same times every day. Avoid going long periods of time without eating.  Eat foods high in fiber, such as fresh fruits, vegetables, beans, and whole grains. Talk to your dietitian about how many servings of carbs you can eat at each meal.  Eat 4-6 ounces (oz) of lean protein each day, such as lean meat, chicken, fish, eggs, or tofu. One oz of lean protein is equal to: ? 1 oz of meat, chicken, or fish. ? 1 egg. ?  cup of tofu.  Eat some foods each day that contain healthy fats, such as avocado, nuts, seeds, and fish. Lifestyle  Check your blood glucose regularly.  Exercise regularly as told by your health care provider. This may include: ? 150 minutes of moderate-intensity or vigorous-intensity exercise each week. This could be brisk walking, biking, or water aerobics. ? Stretching and doing strength exercises, such as yoga or weightlifting, at least 2 times a week.  Take medicines as told by your health care provider.  Do not use any products that contain nicotine or tobacco, such as  cigarettes and e-cigarettes. If you need help quitting, ask your health care provider.  Work with a Veterinary surgeon or diabetes educator to identify strategies to manage stress and any emotional and social challenges. Questions to ask a health care provider  Do I need to meet with a diabetes educator?  Do I need to meet with a dietitian?  What number can I call if I have questions?  When are the best times to check my blood glucose? Where to find more information:  American Diabetes Association: diabetes.org  Academy of Nutrition and Dietetics: www.eatright.[employer] of Diabetes and Digestive and Kidney Diseases (NIH): CarFlippers.tn Summary  A healthy meal plan will help you control your blood glucose and maintain a healthy lifestyle.  Working with a diet and nutrition specialist (dietitian) can help you make a meal plan that is best for you.  Keep in mind that carbohydrates (carbs) and alcohol have immediate effects on your blood glucose levels. It is  important to count carbs and to use alcohol carefully. This information is not intended to replace advice given to you by your health care provider. Make sure you discuss any questions you have with your health care provider. Document Released: 12/29/2004 Document Revised: 03/16/2017 Document Reviewed: 05/08/2016 Elsevier Patient Education  2020 [employer].

## 2019-02-04 NOTE — Addendum Note (Signed)
Addended by: Caralee Ates on: 02/04/2019 04:07 PM   Modules accepted: Level of Service

## 2019-02-04 NOTE — Assessment & Plan Note (Addendum)
Well controlled. Stopped candesartan 1 month ago due to hyperkalemia (5.4). Has cardiologist that agrees with stopping this medication. JNC 8 BP goal for this patient is <150/90. He is within in goal with home BP log and office BP today. No additional medication needed at this time. -Continue Lasix 20mg  -Continue home BP log -F/u PRN

## 2019-02-04 NOTE — Assessment & Plan Note (Addendum)
Asymptomatic. Believed to be drug induced. Offending agent (candesartan) stopped. BMP 01/03/2019 K+ 5.4, Cr 1.14 (baseline 1.03-1.20) BUN 11 GFR 62. Today we will repeat BMP to check potassium levels to check for a decline with removal of the medication. Patient is on a loop diuretic and this will help the body with potassium removal.  -BMP -Dietary precautions. Handout given.  -Continue lasix 20mg  -f/u PRN or sooner if symptoms arise.

## 2019-02-05 ENCOUNTER — Telehealth: Payer: Self-pay | Admitting: Family Medicine

## 2019-02-05 LAB — BASIC METABOLIC PANEL WITH GFR
BUN/Creatinine Ratio: 8 — ABNORMAL LOW (ref 10–24)
BUN: 9 mg/dL (ref 8–27)
CO2: 25 mmol/L (ref 20–29)
Calcium: 9.2 mg/dL (ref 8.6–10.2)
Chloride: 104 mmol/L (ref 96–106)
Creatinine, Ser: 1.09 mg/dL (ref 0.76–1.27)
GFR calc Af Amer: 76 mL/min/1.73
GFR calc non Af Amer: 66 mL/min/1.73
Glucose: 146 mg/dL — ABNORMAL HIGH (ref 65–99)
Potassium: 3.9 mmol/L (ref 3.5–5.2)
Sodium: 143 mmol/L (ref 134–144)

## 2019-02-05 NOTE — Telephone Encounter (Signed)
Called Mr. Brammer home phone, spoke with Mrs. Letizia about normal potassium results. He has follow up scheduled with me 03/28/2019.

## 2019-02-14 ENCOUNTER — Other Ambulatory Visit: Payer: Self-pay | Admitting: Neurology

## 2019-02-21 ENCOUNTER — Other Ambulatory Visit: Payer: Self-pay | Admitting: Neurology

## 2019-02-21 ENCOUNTER — Other Ambulatory Visit: Payer: Self-pay | Admitting: Family Medicine

## 2019-02-21 DIAGNOSIS — G629 Polyneuropathy, unspecified: Secondary | ICD-10-CM

## 2019-02-21 NOTE — Telephone Encounter (Signed)
Seems this medication was discontinued, would you like to refill?

## 2019-02-28 ENCOUNTER — Other Ambulatory Visit: Payer: Self-pay | Admitting: *Deleted

## 2019-03-03 MED ORDER — ATORVASTATIN CALCIUM 40 MG PO TABS: 40.0000 mg | ORAL_TABLET | Freq: Every day | ORAL | 3 refills | Status: DC

## 2019-03-05 DIAGNOSIS — R3912 Poor urinary stream: Secondary | ICD-10-CM | POA: Diagnosis not present

## 2019-03-05 DIAGNOSIS — I619 Nontraumatic intracerebral hemorrhage, unspecified: Secondary | ICD-10-CM | POA: Diagnosis not present

## 2019-03-05 DIAGNOSIS — N401 Enlarged prostate with lower urinary tract symptoms: Secondary | ICD-10-CM | POA: Diagnosis not present

## 2019-03-05 DIAGNOSIS — R339 Retention of urine, unspecified: Secondary | ICD-10-CM | POA: Diagnosis not present

## 2019-03-05 DIAGNOSIS — G8110 Spastic hemiplegia affecting unspecified side: Secondary | ICD-10-CM | POA: Diagnosis not present

## 2019-03-28 ENCOUNTER — Encounter: Payer: Self-pay | Admitting: Family Medicine

## 2019-03-28 ENCOUNTER — Other Ambulatory Visit: Payer: Self-pay

## 2019-03-28 ENCOUNTER — Ambulatory Visit (INDEPENDENT_AMBULATORY_CARE_PROVIDER_SITE_OTHER): Payer: PPO | Admitting: Family Medicine

## 2019-03-28 VITALS — BP 120/64 | HR 52 | Wt 176.0 lb

## 2019-03-28 DIAGNOSIS — Z0000 Encounter for general adult medical examination without abnormal findings: Secondary | ICD-10-CM

## 2019-03-28 DIAGNOSIS — J302 Other seasonal allergic rhinitis: Secondary | ICD-10-CM | POA: Diagnosis not present

## 2019-03-28 DIAGNOSIS — K219 Gastro-esophageal reflux disease without esophagitis: Secondary | ICD-10-CM

## 2019-03-28 DIAGNOSIS — E119 Type 2 diabetes mellitus without complications: Secondary | ICD-10-CM

## 2019-03-28 DIAGNOSIS — I10 Essential (primary) hypertension: Secondary | ICD-10-CM | POA: Diagnosis not present

## 2019-03-28 LAB — POCT GLYCOSYLATED HEMOGLOBIN (HGB A1C): HbA1c, POC (controlled diabetic range): 6.3 % (ref 0.0–7.0)

## 2019-03-28 MED ORDER — LORATADINE 10 MG PO TABS: 10.0000 mg | ORAL_TABLET | Freq: Every day | ORAL | 5 refills | Status: DC

## 2019-03-28 MED ORDER — OMEPRAZOLE 20 MG PO CPDR: 20.0000 mg | DELAYED_RELEASE_CAPSULE | Freq: Every day | ORAL | 3 refills | Status: DC | PRN

## 2019-03-28 NOTE — Assessment & Plan Note (Addendum)
Diet controlled. A1c goal <7.5-8. Patient below goal POCT A1c today 6.3. Last visit was 7.0. No medication needed at this time. He is doing so well we will extend our visits. Since he gets cath wife will bring urine sample back to office for a urine  Microalbumin/creatinine ratio. -Obtain mirco/cr ratio -Repeat A1c in 6 months -Continue physical activity and dieting practices.

## 2019-03-28 NOTE — Assessment & Plan Note (Signed)
Patient is doing well from a diabetes and hypertension standpoint. His wife is his caregiver and says since our last visit he has been more active and doing well. He got his flu vaccination in September and last diabetic foot exam was in September as well. Need to obtain microalbumin lab. With his h/o of BPH and brain injury he has to be catheterized 2x/day by his wife. She will obtain urine sample for lab. -urine specimen cup given -urine microalbumin/creatinine ratio lab future standing order put in -please bring urine sample before next visit in 6 months

## 2019-03-28 NOTE — Assessment & Plan Note (Signed)
JNC 8 BP goal is <150/90 he is below goal. -Continue Lasix 20mg   -f/u PRN

## 2019-03-28 NOTE — Patient Instructions (Signed)
It was very nice to see you today. Please enjoy the rest of your week. Today you were seen for your diabetes your are within in goal your A1c is 6.3 your goal is <8. I have re-prescribed your claritin and omeprazole. Follow up in 6 months for another A1c check or sooner if needed.   Please have wife drop off urine to lab for testing before next appointment. Thank you.  Please call the clinic at [phone] if your symptoms worsen or you have any concerns. It was our pleasure to serve you.

## 2019-03-28 NOTE — Progress Notes (Signed)
  Patient Name: Brendan [name] Date of Birth: 30-Jun-1940 Date of Visit: 03/28/19 PCP: Gerlene Fee, DO  Chief Complaint:   Subjective: [name] is a pleasant 78 y.o. with medical history significant for DM, HTN, CVA, BPH  presenting today for repeat A1c.    Diabetes Doing well without any concerns today. Not endorsing neuropathy at this time. He has been getting outside and doing more yard work since our last visit.   HTN No concerns today. Without headache or changes in vision. Has been doing well since stopping candesartan.  Health maintenance Would like refill on loratidine and omeprazole. Is no longer taking primidone or flomax. Wife caths [name] twice daily as he is not able to urinate due to BPH and brain injury.  ROS: Per HPI.   I have reviewed the patient's medical, surgical, family, and social history as appropriate.  Vitals:   03/28/19 1329  BP: 120/64  Pulse: (!) 52  SpO2: 98%   General: Appears well, no acute distress. Age appropriate. Sitting in chair. Wife at side. Cardiac: RRR, normal heart sounds, no murmurs Respiratory: CTAB, normal effort Skin: Warm and dry, no rashes noted Neuro: alert and oriented, no focal deficits Psych: normal affect   Essential hypertension JNC 8 BP goal is <150/90 he is below goal. -Continue Lasix 20mg   -f/u PRN  Diabetes mellitus without complication (HCC) Diet controlled. A1c goal <7.5-8. Patient below goal POCT A1c today 6.3. Last visit was 7.0. No medication needed at this time. He is doing so well we will extend our visits. Since he gets cath wife will bring urine sample back to office for a urine  Microalbumin/creatinine ratio. -Obtain mirco/cr ratio -Repeat A1c in 6 months -Continue physical activity and dieting practices.  Healthcare maintenance Patient is doing well from a diabetes and hypertension standpoint. His wife is his caregiver and says since our last visit he has been more active and doing  well. He got his flu vaccination in September and last diabetic foot exam was in September as well. Need to obtain microalbumin lab. With his h/o of BPH and brain injury he has to be catheterized 2x/day by his wife. She will obtain urine sample for lab. -urine specimen cup given -urine microalbumin/creatinine ratio lab future standing order put in -please bring urine sample before next visit in 6 months  Return to care in 6 months for repeat A1c or sooner if needed.   Gerlene Fee, [facility] PGY-1  *Discussed with OB Fellow Dr. Phill Myron

## 2019-04-03 ENCOUNTER — Encounter: Payer: Self-pay | Admitting: Cardiology

## 2019-04-03 ENCOUNTER — Ambulatory Visit (INDEPENDENT_AMBULATORY_CARE_PROVIDER_SITE_OTHER): Payer: PPO | Admitting: Cardiology

## 2019-04-03 ENCOUNTER — Other Ambulatory Visit: Payer: Self-pay

## 2019-04-03 VITALS — BP 134/60 | HR 58 | Temp 97.0°F | Ht 66.0 in | Wt 177.0 lb

## 2019-04-03 DIAGNOSIS — R600 Localized edema: Secondary | ICD-10-CM

## 2019-04-03 DIAGNOSIS — I25709 Atherosclerosis of coronary artery bypass graft(s), unspecified, with unspecified angina pectoris: Secondary | ICD-10-CM

## 2019-04-03 DIAGNOSIS — Z951 Presence of aortocoronary bypass graft: Secondary | ICD-10-CM | POA: Diagnosis not present

## 2019-04-03 DIAGNOSIS — I952 Hypotension due to drugs: Secondary | ICD-10-CM

## 2019-04-03 DIAGNOSIS — I208 Other forms of angina pectoris: Secondary | ICD-10-CM | POA: Diagnosis not present

## 2019-04-03 DIAGNOSIS — E785 Hyperlipidemia, unspecified: Secondary | ICD-10-CM

## 2019-04-03 DIAGNOSIS — I25119 Atherosclerotic heart disease of native coronary artery with unspecified angina pectoris: Secondary | ICD-10-CM | POA: Diagnosis not present

## 2019-04-03 NOTE — Patient Instructions (Addendum)
Medication Instructions:  No changes *If you need a refill on your cardiac medications before your next appointment, please call your pharmacy*  Lab Work: labs in jan 2021-- fasting LIPID ,HEPATIC PANEL  Testing/Procedures: Not needed  Follow-Up: At [facility], you and your health needs are our priority.  As part of our continuing mission to provide you with exceptional heart care, we have created designated Provider Care Teams.  These Care Teams include your primary Cardiologist (physician) and Advanced Practice Providers (APPs -  Physician Assistants and Nurse Practitioners) who all work together to provide you with the care you need, when you need it.  Your next appointment:   12 month(s)  The format for your next appointment:   In Person  Provider:   Glenetta Hew, MD  Other Instructions

## 2019-04-03 NOTE — Progress Notes (Signed)
Primary Care Provider: Lavonda Jumbo, DO Cardiologist: Bryan Lemma, MD Neurologist: Dr. Arbutus Leas,  Neurosurgeon, Dr. [name]  Clinic Note: Chief Complaint  Patient presents with  . Follow-up    12 months.  . Coronary Artery Disease    No angina    HPI:    Brendan [name] is a 78 y.o. male with a PMH  below who presents today for annual follow-up.  Brendan [name] is a long-term patient of Dr. Julieanne Manson, who then began following up with me in 2013-2014 timeframe.He is a long-standing history of CAD s/p CABG, HLD, HTN, GERD  CAD -CABGx3 in 1997.   Unstable Angina in June 2013 - Occluded SVG-Diag w/Patent LI  MA-LAD & SVG-OM treated medically. He does have chronic stable angina.   He is on Inderal as BB to help Rx tremors along with CAD.   Last Myoview 09/2015: EF 55-65%. No ischemia or infarction noted. LOW RISK   S/p 3 step Deep Brain Stimulator for Essential Tremor.  Brendan [name] was last seen on April 03, 2018.  At that time he was walking with a walker but had a much less prominent tremor.  Continue to get stronger but still getting tired with activity-recovering from his procedures.  Very pleased with the results of the brain stimulator though.  Tremor much improved.  Really no active cardiac symptoms.  Recent Hospitalizations: None  Reviewed  CV studies:    The following studies were reviewed today: (if available, images/films reviewed: From Epic Chart or Care Everywhere) . None:   Interval History:   Brendan [name] returns today amazingly happy.  He is in great spirits.  He has lost 22 pounds since his last visit.  He feels much better.  Energy level is better.  Tremor is almost gone with his deep brain stimulator.  He spends his days out in the garage restoring his car and cleaning out the garage afterwards.  Certainly if he spends too much time or works will bit harder he will get a bit tired but compared to what he was able to do this is a dramatic  improvement.  He actually feels better being off of the ARB and we have set a goal blood pressure less than 150/90.  He is well within that.  No heart failure symptoms.  In fact is only taking his Lasix every other day for edema.  Sometimes even forgets that.  No PND orthopnea  He has definitely picked up his level of activity, but is still limited.  No active anginal symptoms.  Has not required any nitroglycerin in the last year.  CV Review of Symptoms (Summary): positive for - He does have fatigue and dyspnea with increased exertion, but not improved with routine activity.  No chest pain. negative for - chest pain, irregular heartbeat, orthopnea, palpitations, paroxysmal nocturnal dyspnea, rapid heart rate, shortness of breath or Edema is well controlled with intermittent Lasix.  No syncope or near syncope, no TIA or amaurosis fugax.  No claudication.  The patient does not have symptoms concerning for COVID-19 infection (fever, chills, cough, or new shortness of breath).  The patient is practicing social distancing. ++ Masking when and if he goes out, but rarely goes out for in any other than appointments and occasionally for Groceries/shopping.    REVIEWED OF SYSTEMS   A comprehensive ROS was performed. Review of Systems  Constitutional: Positive for malaise/fatigue (Still has easy fatigue, but notably improved) and weight loss (22 pounds, intentional  with dietary adjustment.).  HENT: Negative for congestion (Only with allergies) and nosebleeds.   Respiratory: Negative for cough and wheezing.   Cardiovascular: Negative for leg swelling (See HPI).  Gastrointestinal: Negative for blood in stool, heartburn and melena.  Genitourinary: Negative for hematuria.  Musculoskeletal: Positive for joint pain. Negative for back pain and falls.  Neurological: Positive for dizziness (He intentionally takes his time getting up because he does have positional dizziness.), tremors (Almost nonexistent with  brain stimulator.) and weakness (Still has global weakness with unsteady gait.). Negative for headaches (Less noticeable).  Endo/Heme/Allergies: Positive for environmental allergies.  Psychiatric/Behavioral: Positive for memory loss (Stable). The patient is not nervous/anxious and does not have insomnia.   All other systems reviewed and are negative.  I have reviewed and (if needed) personally updated the patient's problem list, medications, allergies, past medical and surgical history, social and family history.   PAST MEDICAL HISTORY   Past Medical History:  Diagnosis Date  . Anxiety disorder   . Benign enlargement of prostate   . CAD in native artery 1997   Referred for CABG x3 in 2004 for LAD diagonal bifurcation lesion  . Cervical spinal stenosis   . Complex regional pain syndrome of right upper extremity    Right wrist; L arm  . Coronary atherosclerosis of artery bypass graft June 2013   Occluded SVG-D1; Cardiologist Dr. Ellyn Hack  . Diabetes mellitus without complication ([place])   . Gait disorder   . Gastroesophageal reflux disease   . Glaucoma   . Hiatal hernia    GI: Dr Penelope Coop  . Hyperlipidemia LDL goal <70   . Hypertension   . Memory loss    Mild  . Obesity   . Peripheral neuropathy    possible peripheral neuropathy  . S/P CABG x 3 2004    LIMA-LAD, SVG to diagonal, SVG to OM  . Tremor, essential    On Primidone  . Unstable angina pectoris [facility])  June 2013   Cardiac cath: occluded SVG-DI. Patent LIMA-LAD and SVG-OM. EF 45% with apical inferior HK.     PAST SURGICAL HISTORY   Past Surgical History:  Procedure Laterality Date  . CARDIAC CATHETERIZATION    . CATARACT EXTRACTION, BILATERAL    . CORONARY ARTERY BYPASS GRAFT  06/2002   LIMA-LAD, SVG-D1, SVG-OM  . EYE SURGERY    . KNEE SURGERY Left   . LEFT HEART CATHETERIZATION WITH CORONARY/GRAFT ANGIOGRAM N/A 10/13/2011   Procedure: LEFT HEART CATHETERIZATION WITH Beatrix Fetters;  Surgeon: Leonie Man, MD;  Location: [facility] CATH LAB;  Service: Cardiovascular;  Laterality: N/A;  . MINOR PLACEMENT OF FIDUCIAL N/A 04/26/2017   Procedure: Fiducial placement;  Surgeon: Erline Levine, MD;  Location: [facility];  Service: Neurosurgery;  Laterality: N/A;  Fiducial placement  . NM MYOCAR PERF WALL MOTION  03/23/2009   protocol:Bruce, normal perfusion in all regions, post-stress EF 72%, exercise capacity 7METS, EKG negative for ischemia.  [name] PULSE GENERATOR IMPLANT Bilateral 11/13/2017   Procedure: Bilateral Implantable pulse generator;  Surgeon: Erline Levine, MD;  Location: [facility];  Service: Neurosurgery;  Laterality: Bilateral;  Bilateral Implantable pulse generator  . Shoulder Orthoscopic Surgery   06/2003  . SUBTHALAMIC STIMULATOR INSERTION Bilateral 05/04/2017   Procedure: Bilateral Deep brain stimulator placement;  Surgeon: Erline Levine, MD;  Location: [facility];  Service: Neurosurgery;  Laterality: Bilateral;  Bilateral deep brain stimulator placement  . TRANSTHORACIC ECHOCARDIOGRAM  04/2017   Jan 2019: Normal LV size and function.  EF 55-60%.  No or W MA.  Mild LA dilation.  . wrist ganglion cyst Left      MEDICATIONS/ALLERGIES   Current Meds  Medication Sig  . aspirin EC 81 MG tablet Take 81 mg by mouth daily.  [name]. atorvastatin (LIPITOR) 40 MG tablet Take 1 tablet (40 mg total) by mouth daily.  . fluticasone (FLONASE) 50 MCG/ACT nasal spray USE 2 SPRAYS IN BOTH NOSTRILS DAILY  . furosemide (LASIX) 20 MG tablet TAKE 1 TABLET BY MOUTH DAILY, TAKE AN EXTRA DOSE IF NEEDED  . gabapentin (NEURONTIN) 300 MG capsule TAKE 1 CAPSULE BY MOUTH EACH NIGHT AT BEDTIME  . ketoconazole (NIZORAL) 2 % shampoo Apply 1 application topically once a week.  . loratadine (ALLERGY RELIEF) 10 MG tablet Take 1 tablet (10 mg total) by mouth daily.  . Melatonin 3 MG TABS Take 1 tablet (3 mg total) by mouth at bedtime.  . methocarbamol (ROBAXIN) 750 MG tablet TAKE 1 TABLET BY MOUTH TWICE DAILY AS NEEDED FOR MUSCLE SPASMS  .  nitroGLYCERIN (NITROSTAT) 0.4 MG SL tablet Place 1 tablet (0.4 mg total) under the tongue every 5 (five) minutes as needed for chest pain. call 911 if chest pain not better  . omeprazole (PRILOSEC) 20 MG capsule Take 1 capsule (20 mg total) by mouth daily as needed (acid reflux).  [name]. PROAIR HFA 108 (90 Base) MCG/ACT inhaler INHALE 2 PUFFS INTO THE LUNGS 4 TIMES DAILY AS NEEDED  . propranolol (INDERAL) 40 MG tablet TAKE 1 TABLET BY MOUTH TWICE DAILY  . traMADol (ULTRAM) 50 MG tablet TAKE 1 TABLET BY MOUTH EVERY 6 HOURS AS NEEDED FOR MODERATE PAIN  . traZODone (DESYREL) 50 MG tablet TAKE 1 TABLET BY MOUTH EACH NIGHT AT BEDTIME    Allergies  Allergen Reactions  . Lisinopril Cough    SOCIAL HISTORY/FAMILY HISTORY   Social History   Tobacco Use  . Smoking status: Former Smoker    Packs/day: 3.00    Years: 20.00    Pack years: 60.00    Types: Cigarettes    Quit date: 04/17/1968    Years since quitting: 50.9  . Smokeless tobacco: Never Used  Substance Use Topics  . Alcohol use: No  . Drug use: No   Social History   Social History Narrative   Married.  Wife is Royal PiedraMary Toole.   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   Former smoker, quit 20 years ago. Does not drink alcohol.   Does not get routine exercise, walks sometimes with a cane.   Caffeine use: diet-soda   Left handed     Family History family history includes Drug abuse in his daughter; Heart disease in his brother, brother, and mother; Hepatitis C in his daughter; Tremor in his paternal uncle.   OBJCTIVE -PE, EKG, labs   Wt Readings from Last 3 Encounters:  04/03/19 177 lb (80.3 kg)  03/28/19 176 lb (79.8 kg)  02/04/19 183 lb 12.8 oz (83.4 kg)    Physical Exam: BP 134/60 (BP Location: Left Arm, Patient Position: Sitting, Cuff Size: Normal)   Pulse (!) 58   Temp (!) 97 F (36.1 C)   Ht 5\' 6"  (1.676 m)   Wt 177 lb (80.3 kg)   BMI 28.57 kg/m  Physical Exam  Constitutional: He is oriented to person,  place, and time. He appears well-developed and well-nourished. No distress.  He looks great today.  Much healthier.  Well-groomed.  Great spirits  HENT:  Head: Normocephalic and atraumatic.  I his hair has  all grown back  Neck: No hepatojugular reflux and no JVD present. Carotid bruit is not present. No thyromegaly present.  Cardiovascular: Normal rate, regular rhythm, S1 normal, S2 normal and intact distal pulses.  No extrasystoles are present. PMI is not displaced. Exam reveals distant heart sounds. Exam reveals no gallop and no friction rub.  No murmur heard. Pulmonary/Chest: Effort normal and breath sounds normal. No respiratory distress.  Abdominal: Soft. Bowel sounds are normal. He exhibits no distension. There is no abdominal tenderness.  Musculoskeletal:        General: No edema. Normal range of motion.     Cervical back: Normal range of motion and neck supple.     Comments: Still has a slow wide-based gait  Neurological: He is alert and oriented to person, place, and time.  Answers questions much more fluidly and with more precision/speed.  Defers to his wife much less  Skin: Skin is warm and dry.  Psychiatric: He has a normal mood and affect. His behavior is normal. Judgment and thought content normal.  Vitals reviewed.    Adult ECG Report Not checked  Recent Labs:    Lab Results  Component Value Date   CHOL 109 04/23/2018   HDL 34 (L) 04/23/2018   LDLCALC 55 04/23/2018   LDLDIRECT 84 08/30/2006   TRIG 100 04/23/2018   CHOLHDL 3.2 04/23/2018   Lab Results  Component Value Date   CREATININE 1.09 02/04/2019   BUN 9 02/04/2019   NA 143 02/04/2019   K 3.9 02/04/2019   CL 104 02/04/2019   CO2 25 02/04/2019    ASSESSMENT/PLAN    Problem List Items Addressed This Visit    Coronary artery disease involving coronary bypass graft of native heart with angina pectoris (HCC) - Primary (Chronic)    2 of 3 grafts patent, one occluded.  No recurrent anginal symptoms.  He is  not really all that active, but now that he is lost weight he has become more mobile and is not noting any anginal symptoms.  No heart failure symptoms.  Plan: Continue current dose of beta-blocker (using Inderal for his tremor) along with aspirin and atorvastatin. No longer appears to be on ARB, and was stable blood pressures we can hold off.      S/P CABG x 3 (Chronic)    We will plan to check a Myoview a few years ago, this was before all of his brain stimulator implant.  However the study was very difficult because of his tremor.  Currently not having any symptoms.  He was reluctant to discuss stress testing at this point.  He is doing so well that I felt that we could wait and discuss another day.  My general understanding is that he would not want to do a stress test unless he was active having symptoms.      Relevant Orders   Hepatic function panel   Lipid panel   CHRONIC STABLE ANGINA (Chronic)    He is not noticing any more angina.  Partially because he is not doing that much, but with his weight loss, he has not required any nitroglycerin and is no longer on the long-acting nitrate.  Continue beta-blocker.  Would only restart ARB if blood pressures increase.      Atherosclerosis of native coronary artery of native heart with angina pectoris (HCC) (Chronic)   Hyperlipidemia LDL goal <70 (Chronic)    Lipids were last checked January of this year.  LDL was 55.  Total cholesterol  109.  Well-controlled on atorvastatin.  With all of his weight loss, I would hope that we may be able to reduce the dose if follow-up labs are adequately controlled.  Plan: Recheck lipids and LFTs.      Relevant Orders   Hepatic function panel   Lipid panel   Lower extremity edema (Chronic)    Minimal now.  Only taking Lasix every other day.  Does not seem like it is true heart failure.  Probably more like venous stasis.      Hypotension due to drugs    Was having issues with blood pressure, we  held his ARB.  Monitor now but would probably allow for borderline permissive hypertension unless he has heart failure symptoms.          COVID-19 Education: The signs and symptoms of COVID-19 were discussed with the patient and how to seek care for testing (follow up with PCP or arrange E-visit).   The importance of social distancing was discussed today.  I spent a total of 18 minutes with the patient and chart review. >  50% of the time was spent in direct patient consultation.  Additional time spent with chart review (studies, outside notes, etc): 6 Total Time: 24 min   Current medicines are reviewed at length with the patient today.  (+/- concerns) --taking Lasix every other day   Patient Instructions / Medication Changes & Studies & Tests Ordered   Patient Instructions  Medication Instructions:  No changes *If you need a refill on your cardiac medications before your next appointment, please call your pharmacy*  Lab Work: labs in [place] 2021-- fasting LIPID ,HEPATIC PANEL  Testing/Procedures: Not needed  Follow-Up: At [facility], you and your health needs are our priority.  As part of our continuing mission to provide you with exceptional heart care, we have created designated Provider Care Teams.  These Care Teams include your primary Cardiologist (physician) and Advanced Practice Providers (APPs -  Physician Assistants and Nurse Practitioners) who all work together to provide you with the care you need, when you need it.  Your next appointment:   12 month(s)  The format for your next appointment:   In Person  Provider:   Bryan Lemma, MD  Other Instructions     Studies Ordered:   Orders Placed This Encounter  Procedures  . Hepatic function panel  . Lipid panel     Bryan Lemma, M.D., M.S. Interventional Cardiologist   Pager # [phone] Phone # [phone] [address] [place], [place] [address]   Thank you for choosing  Heartcare at [facility]!!

## 2019-04-04 ENCOUNTER — Encounter: Payer: Self-pay | Admitting: Cardiology

## 2019-04-04 NOTE — Assessment & Plan Note (Signed)
2 of 3 grafts patent, one occluded.  No recurrent anginal symptoms.  He is not really all that active, but now that he is lost weight he has become more mobile and is not noting any anginal symptoms.  No heart failure symptoms.  Plan: Continue current dose of beta-blocker (using Inderal for his tremor) along with aspirin and atorvastatin. No longer appears to be on ARB, and was stable blood pressures we can hold off.

## 2019-04-04 NOTE — Assessment & Plan Note (Signed)
Was having issues with blood pressure, we held his ARB.  Monitor now but would probably allow for borderline permissive hypertension unless he has heart failure symptoms.

## 2019-04-04 NOTE — Assessment & Plan Note (Signed)
Minimal now.  Only taking Lasix every other day.  Does not seem like it is true heart failure.  Probably more like venous stasis.

## 2019-04-04 NOTE — Assessment & Plan Note (Addendum)
We will plan to check a Myoview a few years ago, this was before all of his brain stimulator implant.  However the study was very difficult because of his tremor.  Currently not having any symptoms.  He was reluctant to discuss stress testing at this point.  He is doing so well that I felt that we could wait and discuss another day.  My general understanding is that he would not want to do a stress test unless he was active having symptoms.

## 2019-04-04 NOTE — Assessment & Plan Note (Addendum)
Lipids were last checked January of this year.  LDL was 55.  Total cholesterol 109.  Well-controlled on atorvastatin.  With all of his weight loss, I would hope that we may be able to reduce the dose if follow-up labs are adequately controlled.  Plan: Recheck lipids and LFTs.

## 2019-04-04 NOTE — Assessment & Plan Note (Signed)
He is not noticing any more angina.  Partially because he is not doing that much, but with his weight loss, he has not required any nitroglycerin and is no longer on the long-acting nitrate.  Continue beta-blocker.  Would only restart ARB if blood pressures increase.

## 2019-04-22 ENCOUNTER — Other Ambulatory Visit: Payer: Self-pay | Admitting: Family Medicine

## 2019-04-22 DIAGNOSIS — G629 Polyneuropathy, unspecified: Secondary | ICD-10-CM

## 2019-04-29 DIAGNOSIS — G8110 Spastic hemiplegia affecting unspecified side: Secondary | ICD-10-CM | POA: Diagnosis not present

## 2019-04-29 DIAGNOSIS — R339 Retention of urine, unspecified: Secondary | ICD-10-CM | POA: Diagnosis not present

## 2019-04-29 DIAGNOSIS — I619 Nontraumatic intracerebral hemorrhage, unspecified: Secondary | ICD-10-CM | POA: Diagnosis not present

## 2019-05-06 ENCOUNTER — Encounter: Payer: PPO | Attending: Physical Medicine & Rehabilitation | Admitting: Physical Medicine & Rehabilitation

## 2019-05-06 ENCOUNTER — Other Ambulatory Visit: Payer: Self-pay

## 2019-05-06 ENCOUNTER — Encounter: Payer: Self-pay | Admitting: Physical Medicine & Rehabilitation

## 2019-05-06 VITALS — BP 146/71 | HR 55 | Temp 97.5°F | Ht 66.0 in | Wt 180.0 lb

## 2019-05-06 DIAGNOSIS — R339 Retention of urine, unspecified: Secondary | ICD-10-CM

## 2019-05-06 DIAGNOSIS — G479 Sleep disorder, unspecified: Secondary | ICD-10-CM | POA: Diagnosis not present

## 2019-05-06 DIAGNOSIS — G250 Essential tremor: Secondary | ICD-10-CM | POA: Diagnosis not present

## 2019-05-06 DIAGNOSIS — Z951 Presence of aortocoronary bypass graft: Secondary | ICD-10-CM | POA: Diagnosis not present

## 2019-05-06 DIAGNOSIS — I619 Nontraumatic intracerebral hemorrhage, unspecified: Secondary | ICD-10-CM | POA: Diagnosis not present

## 2019-05-06 DIAGNOSIS — F09 Unspecified mental disorder due to known physiological condition: Secondary | ICD-10-CM

## 2019-05-06 DIAGNOSIS — I10 Essential (primary) hypertension: Secondary | ICD-10-CM | POA: Diagnosis not present

## 2019-05-06 DIAGNOSIS — N319 Neuromuscular dysfunction of bladder, unspecified: Secondary | ICD-10-CM

## 2019-05-06 DIAGNOSIS — E785 Hyperlipidemia, unspecified: Secondary | ICD-10-CM | POA: Diagnosis not present

## 2019-05-06 NOTE — Progress Notes (Addendum)
Subjective:    Patient ID: Brendan [name], male    DOB: 1940/09/23, 79 y.o.   MRN: [id number]  HPI Right-handed male with history of CAD and stenting as well as CABG, memory loss with bilateral deep brain stimulator placement presents for follow up for left frontal intraparenchymal hemorrhage, DBS, debility.  Last clinic visit on 05/03/2018.  Since that time, pt saw PCP, Cards, and Neuro.  Notes reviewed.  Neuro adjusted meds for essential tremor and discussed driving.  Wife supplements history. Since that time, pt states he continues HEP.  He is driving.  Tremors have improved. He is taking Robaxin 1/day. Sleep is relatively poor. Wife notes good sleep hygiene. He has never slept well.  He still requiring caths 2/day. BP is relatively controlled. Released by Neurosurg.   Pain Inventory Average Pain 3 Pain Right Now 6 My pain is dull, tingling and aching  In the last 24 hours, has pain interfered with the following? General activity 2 Relation with others 0 Enjoyment of life 2 What TIME of day is your pain at its worst? night Sleep (in general) Poor  Pain is worse with: some activites Pain improves with: medication Relief from Meds: 8  Mobility walk without assistance ability to climb steps?  yes do you drive?  yes  Function retired  Neuro/Psych bladder control problems weakness  Prior Studies Any changes since last visit?  no  Physicians involved in your care Any changes since last visit?  no   Family History  Problem Relation Age of Onset  . Heart disease Mother   . Heart disease Brother   . Tremor Paternal Uncle   . Heart disease Brother   . Drug abuse Daughter   . Hepatitis C Daughter    Social History   Socioeconomic History  . Marital status: Married    Spouse name: [name]  . Number of children: 2  . Years of education: 10  . Highest education level: 10th grade  Occupational History  . Occupation: retired    Comment: Geneticist, molecular - facilities  management x 30 years  Tobacco Use  . Smoking status: Former Smoker    Packs/day: 3.00    Years: 20.00    Pack years: 60.00    Types: Cigarettes    Quit date: 04/17/1968    Years since quitting: 51.0  . Smokeless tobacco: Never Used  Substance and Sexual Activity  . Alcohol use: No  . Drug use: No  . Sexual activity: Never  Other Topics Concern  . Not on file  Social History Narrative   Married.  Wife is [place].   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   Former smoker, quit 20 years ago. Does not drink alcohol.   Does not get routine exercise, walks sometimes with a cane.   Caffeine use: diet-soda   Left handed    Social Determinants of Health   Financial Resource Strain:   . Difficulty of Paying Living Expenses: Not on file  Food Insecurity:   . Worried About Programme researcher, broadcasting/film/video in the Last Year: Not on file  . Ran Out of Food in the Last Year: Not on file  Transportation Needs:   . Lack of Transportation (Medical): Not on file  . Lack of Transportation (Non-Medical): Not on file  Physical Activity:   . Days of Exercise per Week: Not on file  . Minutes of Exercise per Session: Not on file  Stress:   . Feeling of Stress : Not  on file  Social Connections:   . Frequency of Communication with Friends and Family: Not on file  . Frequency of Social Gatherings with Friends and Family: Not on file  . Attends Religious Services: Not on file  . Active Member of Clubs or Organizations: Not on file  . Attends Banker Meetings: Not on file  . Marital Status: Not on file   Past Surgical History:  Procedure Laterality Date  . CARDIAC CATHETERIZATION    . CATARACT EXTRACTION, BILATERAL    . CORONARY ARTERY BYPASS GRAFT  06/2002   LIMA-LAD, SVG-D1, SVG-OM  . EYE SURGERY    . KNEE SURGERY Left   . LEFT HEART CATHETERIZATION WITH CORONARY/GRAFT ANGIOGRAM N/A 10/13/2011   Procedure: LEFT HEART CATHETERIZATION WITH Isabel Caprice;  Surgeon: Marykay Lex, MD;  Location: [facility] CATH LAB;  Service: Cardiovascular;  Laterality: N/A;  . MINOR PLACEMENT OF FIDUCIAL N/A 04/26/2017   Procedure: Fiducial placement;  Surgeon: Maeola Harman, MD;  Location: [facility] OR;  Service: Neurosurgery;  Laterality: N/A;  Fiducial placement  . NM MYOCAR PERF WALL MOTION  03/23/2009   protocol:Bruce, normal perfusion in all regions, post-stress EF 72%, exercise capacity , EKG negative for ischemia.  [name] PULSE GENERATOR IMPLANT Bilateral 11/13/2017   Procedure: Bilateral Implantable pulse generator;  Surgeon: Maeola Harman, MD;  Location: [facility] OR;  Service: Neurosurgery;  Laterality: Bilateral;  Bilateral Implantable pulse generator  . Shoulder Orthoscopic Surgery   06/2003  . SUBTHALAMIC STIMULATOR INSERTION Bilateral 05/04/2017   Procedure: Bilateral Deep brain stimulator placement;  Surgeon: Maeola Harman, MD;  Location: [facility] OR;  Service: Neurosurgery;  Laterality: Bilateral;  Bilateral deep brain stimulator placement  . TRANSTHORACIC ECHOCARDIOGRAM  04/2017   Jan 2019: Normal LV size and function.  EF 55-60%.  No or W MA.  Mild LA dilation.  . wrist ganglion cyst Left    Past Medical History:  Diagnosis Date  . Anxiety disorder   . Benign enlargement of prostate   . CAD in native artery 1997   Referred for CABG x3 in 2004 for LAD diagonal bifurcation lesion  . Cervical spinal stenosis   . Complex regional pain syndrome of right upper extremity    Right wrist; L arm  . Coronary atherosclerosis of artery bypass graft June 2013   Occluded SVG-D1; Cardiologist Dr. Herbie Baltimore  . Diabetes mellitus without complication (HCC)   . Gait disorder   . Gastroesophageal reflux disease   . Glaucoma   . Hiatal hernia    GI: Dr Evette Cristal  . Hyperlipidemia LDL goal <70   . Hypertension   . Memory loss    Mild  . Obesity   . Peripheral neuropathy    possible peripheral neuropathy  . S/P CABG x 3 2004    LIMA-LAD, SVG to diagonal, SVG to OM  . Tremor, essential    On  Primidone  . Unstable angina pectoris [facility])  June 2013   Cardiac cath: occluded SVG-DI. Patent LIMA-LAD and SVG-OM. EF 45% with apical inferior HK.   BP (!) 146/71   Pulse (!) 55   Temp (!) 97.5 F (36.4 C)   Ht 5\' 6"  (1.676 m)   Wt 180 lb (81.6 kg)   SpO2 95%   BMI 29.05 kg/m   Opioid Risk Score:   Fall Risk Score:  `1  Depression screen PHQ 2/9  Depression screen [facility] 2/9 03/28/2019 05/03/2018 04/23/2018 12/28/2017 12/05/2017 11/28/2017 08/29/2017  Decreased Interest 0 0 0 0 0 0 0  Down, Depressed, Hopeless 0 0 0 0 0 0 0  PHQ - 2 Score 0 0 0 0 0 0 0  Some recent data might be hidden   Review of Systems  Constitutional: Negative.   HENT: Negative.   Eyes: Negative.   Respiratory: Negative.   Cardiovascular: Negative.   Gastrointestinal: Negative.   Endocrine: Negative.   Genitourinary: Negative.   Skin: Negative.   Allergic/Immunologic: Negative.   Neurological: Positive for weakness.  Hematological: Negative.       Objective:   Physical Exam General: NAD. Well-developed.  HEENT: Normocephalic, atraumatic. Cardio: RRR. No JVD. Resp: CTA bilaterally. Normal effort   GI: BS+ and ND Musc/Skel:  No edema, no tenderness. Gait: Wide based Neuro: Alert Motor:  RUE: 5/5 proximal to distal RLE: 5/5 proximal to distal ?Minimal ataxia LUE No tremor Skin:   Intact. Warm and dry    Assessment & Plan:  79 year old right-handed male with history of CAD and stenting as well as CABG, memory loss with bilateral deep brain stimulator placement presents for follow up for left frontal intraparenchymal hemorrhage, DBS, debility.  1. Decreased functional mobility secondary to left frontal intraparenchymal hemorrhage with recent deep brain stimulator for tremor.    Cont HEP  Cont follow up with Neurology  Tremor essentially resolved - long process with complications, patient believes it was all worth it, wife disagrees. Only when patient grabs something with RUE.  He is driving  again  2. Pain:    Mainly in b/l LE  Cont Robaxin 500 , ~1/day  Cont tylenol as needed  3. Tremors:  See #1  4. Sleep disturbance    Cont Melatonin   Educated on sleep hygiene  Will consider increase in Trazodone to 100 qhs, currently taking 50mg , after ECG.  May d/c Trazodone all together based on ECG results and trial a different medication.   Recommended ECG to evaluation QTc as prolonged on 07/2017, through ?Dr. Carles Collet due to issues with DBS per wife.   5.  Neurogenic bladder  Cont Flomax   Cont follow up with Urology  Cont I/O caths, ~2/day  6. HTN  Relatively controlled

## 2019-05-07 ENCOUNTER — Ambulatory Visit: Payer: PPO | Admitting: Physical Medicine & Rehabilitation

## 2019-05-07 LAB — HEPATIC FUNCTION PANEL
ALT: 20 IU/L (ref 0–44)
AST: 20 IU/L (ref 0–40)
Albumin: 4.3 g/dL (ref 3.7–4.7)
Alkaline Phosphatase: 159 IU/L — ABNORMAL HIGH (ref 39–117)
Bilirubin Total: 1.1 mg/dL (ref 0.0–1.2)
Bilirubin, Direct: 0.3 mg/dL (ref 0.00–0.40)
Total Protein: 7.1 g/dL (ref 6.0–8.5)

## 2019-05-07 LAB — LIPID PANEL
Chol/HDL Ratio: 2.9 ratio (ref 0.0–5.0)
Cholesterol, Total: 115 mg/dL (ref 100–199)
HDL: 39 mg/dL — ABNORMAL LOW (ref >39)
LDL Chol Calc (NIH): 58 mg/dL (ref 0–99)
Triglycerides: 90 mg/dL (ref 0–149)
VLDL Cholesterol Cal: 18 mg/dL (ref 5–40)

## 2019-05-14 ENCOUNTER — Telehealth: Payer: Self-pay | Admitting: Cardiology

## 2019-05-14 NOTE — Telephone Encounter (Signed)
Patient's wife calling stating the patient was supposed to have an EKG back in decemeber but they could not turnoff his stimulator on his head. She said it has been turning off fine at home and would like to know if they should try again.

## 2019-05-14 NOTE — Telephone Encounter (Signed)
Should be okay.  We can just likely see him in follow-up.  Bryan Lemma, MD

## 2019-05-14 NOTE — Telephone Encounter (Signed)
Will route to MD to advise if patient should have EKG- last one on file was 07/2017. Thank you!

## 2019-05-15 NOTE — Telephone Encounter (Signed)
Called patient back and discussed that it was okay to wait until follow up- wife states that Dr.Patel wants to increase his trazodone but states it could have some issues with his heart and they did not have a recent EKG, but wanted to check with Dr.Harding. I advised I would notify MD of this, recent note from Dr.Patel in chart.

## 2019-05-17 NOTE — Telephone Encounter (Signed)
I think the concern is QT interval.  I understand his concern.  Perhaps we can have him come in for an EKG check.  Bryan Lemma, MD

## 2019-05-19 NOTE — Telephone Encounter (Signed)
Called patient, spoke to wife- she states they can do Friday at 2:00 PM. Notified patient that was fine.  Will notify front desk to open the nurse visit for that day and time only. Notify primary nurse as well, if she is able to do the EKG, if not we will have triage nurses complete, per Dr.Harding.   Thank you!

## 2019-05-22 ENCOUNTER — Telehealth: Payer: Self-pay | Admitting: *Deleted

## 2019-05-22 NOTE — Telephone Encounter (Signed)
-----   Message from Marykay Lex, MD sent at 05/17/2019  8:37 PM EST ----- Chemistry panel looks pretty good.  Alkaline phosphatase which is a liver marker remains elevated but stable compared to last year.  Otherwise kidney function is stable and no other liver function tests are abnormal.  Cholesterol levels look great with total cholesterol 115 and LDL of 59.  For now I think we can continue current dose of medicines.  Bryan Lemma, MD

## 2019-05-22 NOTE — Telephone Encounter (Signed)
CALLED - NO ANSWER , UNABLE TO LEAVE MESSAGE,NO  ANSWER MACHINE AVAILABLE

## 2019-05-23 ENCOUNTER — Encounter: Payer: Self-pay | Admitting: *Deleted

## 2019-05-23 ENCOUNTER — Ambulatory Visit (INDEPENDENT_AMBULATORY_CARE_PROVIDER_SITE_OTHER): Payer: PPO | Admitting: *Deleted

## 2019-05-23 ENCOUNTER — Other Ambulatory Visit: Payer: Self-pay

## 2019-05-23 ENCOUNTER — Other Ambulatory Visit: Payer: Self-pay | Admitting: Family Medicine

## 2019-05-23 VITALS — BP 140/60 | HR 61 | Ht 66.0 in

## 2019-05-23 DIAGNOSIS — G479 Sleep disorder, unspecified: Secondary | ICD-10-CM

## 2019-05-23 DIAGNOSIS — G629 Polyneuropathy, unspecified: Secondary | ICD-10-CM

## 2019-05-23 DIAGNOSIS — Z79899 Other long term (current) drug therapy: Secondary | ICD-10-CM | POA: Diagnosis not present

## 2019-05-23 DIAGNOSIS — I619 Nontraumatic intracerebral hemorrhage, unspecified: Secondary | ICD-10-CM

## 2019-05-23 DIAGNOSIS — G250 Essential tremor: Secondary | ICD-10-CM

## 2019-05-23 NOTE — Progress Notes (Cosign Needed)
1.) Reason for visit:  EKG only  2.) Name of MD requesting visit:  Dr  Monika Salk  And Dr Herbie Baltimore  3.) H&P: n/a  4.) ROS related to problem:  Dr Allena Katz would like to increase dose of Trazodne 50 mg to a higher dose. The patient needes an EKG to  Check  QT , and rhythm of heart. Patient arrive with wife. Brendan [name] turned off  Brain stimulator for an uninterrupted  EKG reading. EKG obtained  And await review by Dr Herbie Baltimore   5.) Assessment and plan per MD:   EKG  Obtained , awaiting for Dr Herbie Baltimore review.  Patient and wife aware Dr Herbie Baltimore will be in the office 05/28/19.  DR Allena Katz will be notified  About the result once reviewed

## 2019-05-23 NOTE — Patient Instructions (Signed)
Patient and wife aware once EKG is reviewed by Dr Herbie Baltimore, the information will be given to Dr Allena Katz

## 2019-05-28 NOTE — Progress Notes (Signed)
EKG reviewed from 05/23/2019: Shows sinus rhythm, rate 61 bpm.  Left axis deviation (-60).  There is now more notable QRS widening suggesting of incomplete LBBB.  Not really a significant change compared to prior EKG.  Previous EKG had some inferior T wave inversions in lead III that are no longer present.  Essentially this is stable EKG with no abnormal findings to be concerned of.  QTc was 422 PR interval is 186.  Should be okay for trazodone dose titration.  Bryan Lemma, MD

## 2019-06-07 NOTE — Progress Notes (Signed)
Brendan [name] was seen today in follow up for essential tremor.  Primidone was discontinued after last visit.  Wife states that she restarted it about a few weeks ago when he started shaking.  She is not sure if he was anxious.  He was only on it for a week.  He is still on low-dose gabapentin at bedtime (higher dosages caused myoclonus).  My previous records were reviewed prior to todays visit, as well as those of other providers that are available to me. Pt denies falls.  Pt denies lightheadedness, near syncope.  No hallucinations.  Mood has been good.  Patient last saw cardiology in December.  Notes from his visit indicate patient had lost 22 pounds and felt great.  He last saw PMR on January 19.  Patient indicates trouble sleeping.  States that Dr. Posey Pronto was going to ask me something about his medication.  It appears that EKG was done to assess for QT interval and the patient has a follow-up with Dr. Posey Pronto in a few weeks.  Records indicate from that visit that the patient was back driving.  PREVIOUS MEDICATIONS:  primidone  ALLERGIES:   Allergies  Allergen Reactions  . Lisinopril Cough    CURRENT MEDICATIONS:  Outpatient Encounter Medications as of 06/09/2019  Medication Sig  . aspirin EC 81 MG tablet Take 81 mg by mouth daily.  [name] atorvastatin (LIPITOR) 40 MG tablet Take 1 tablet (40 mg total) by mouth daily.  . fluticasone (FLONASE) 50 MCG/ACT nasal spray USE 2 SPRAYS IN BOTH NOSTRILS DAILY  . furosemide (LASIX) 20 MG tablet TAKE 1 TABLET BY MOUTH DAILY, TAKE AN EXTRA DOSE IF NEEDED  . gabapentin (NEURONTIN) 300 MG capsule TAKE 1 CAPSULE BY MOUTH EACH NIGHT AT BEDTIME  . ketoconazole (NIZORAL) 2 % shampoo Apply 1 application topically once a week.  . loratadine (ALLERGY RELIEF) 10 MG tablet Take 1 tablet (10 mg total) by mouth daily.  . Melatonin 3 MG TABS Take 1 tablet (3 mg total) by mouth at bedtime.  . methocarbamol (ROBAXIN) 750 MG tablet TAKE 1 TABLET BY MOUTH TWICE DAILY AS  NEEDED FOR MUSCLE SPASMS  . nitroGLYCERIN (NITROSTAT) 0.4 MG SL tablet Place 1 tablet (0.4 mg total) under the tongue every 5 (five) minutes as needed for chest pain. call 911 if chest pain not better  . omeprazole (PRILOSEC) 20 MG capsule Take 1 capsule (20 mg total) by mouth daily as needed (acid reflux).  [name] PROAIR HFA 108 (90 Base) MCG/ACT inhaler INHALE 2 PUFFS INTO THE LUNGS 4 TIMES DAILY AS NEEDED  . propranolol (INDERAL) 40 MG tablet TAKE 1 TABLET BY MOUTH TWICE DAILY  . traMADol (ULTRAM) 50 MG tablet TAKE 1 TABLET BY MOUTH EVERY 6 HOURS AS NEEDED FOR MODERATE PAIN  . traZODone (DESYREL) 50 MG tablet TAKE 1 TABLET BY MOUTH EACH NIGHT AT BEDTIME   Facility-Administered Encounter Medications as of 06/09/2019  Medication  . 0.9 %  sodium chloride infusion  . sodium chloride 0.9 % injection 3 mL    PHYSICAL EXAMINATION:    VITALS:   Vitals:   06/09/19 1304  BP: (!) 187/76  Pulse: 60  SpO2: 97%  Weight: 182 lb (82.6 kg)  Height: 5\' 6"  (1.676 m)    GEN:  The patient appears stated age and is in NAD. HEENT:  Normocephalic, atraumatic.  The mucous membranes are moist. The superficial temporal arteries are without ropiness or tenderness. CV:  RRR Lungs:  CTAB Neck/HEME:  There  are no carotid bruits bilaterally.  Neurological examination:  Orientation: The patient is alert and oriented x3. Cranial nerves: There is good facial symmetry. The speech is fluent and clear. Soft palate rises symmetrically and there is no tongue deviation. Hearing is intact to conversational tone. Sensation: Sensation is intact to light touch throughout Motor: Strength is at least antigravity x4.  Movement examination: Tone: There is normal tone in the UE/LE Abnormal movements: none seen Gait and Station: The patient ambulates well   ASSESSMENT/PLAN:  1.  Essential Tremor  Patient is status post bilateral DBS to the bilateral VIM on May 04, 2017.  Patient had postoperative bleed on the left,  with resultant hematoma causing paralysis on the right, which has since resolved.  Because of complications, the patient's IPG was not placed until July, 2019.  Tremor is well controlled now.  2.  History of gabapentin induced myoclonus  -Patient is only on 300 mg at night now.  I am going to try and stop this.  Patient does not remember why he was on the gabapentin.  He may be using it for some sleep, but Dr. Allena Katz is working with other medications in that regard.  3.  Insomnia  -Following with Dr. Allena Katz.  I reviewed his EKG.  I certainly have no objections to his trazodone.  4.  I will see the patient back in 1 year.  I think his battery has probably 2 or 3 years left.  He will let me know if he needs me before that visit, or if has any problems going off the gabapentin.  Total time spent on today's visit was  30 minutes, including both face-to-face time and nonface-to-face time.  Time included that spent on review of records (prior notes available to me/labs/imaging if pertinent), discussing treatment and goals, answering patient's questions and coordinating care.  Cc:  Lavonda Jumbo, DO

## 2019-06-09 ENCOUNTER — Ambulatory Visit (INDEPENDENT_AMBULATORY_CARE_PROVIDER_SITE_OTHER): Payer: PPO | Admitting: Neurology

## 2019-06-09 ENCOUNTER — Other Ambulatory Visit: Payer: Self-pay

## 2019-06-09 ENCOUNTER — Encounter: Payer: Self-pay | Admitting: Neurology

## 2019-06-09 VITALS — BP 187/76 | HR 60 | Ht 66.0 in | Wt 182.0 lb

## 2019-06-09 DIAGNOSIS — Z9689 Presence of other specified functional implants: Secondary | ICD-10-CM | POA: Diagnosis not present

## 2019-06-09 DIAGNOSIS — G4700 Insomnia, unspecified: Secondary | ICD-10-CM | POA: Diagnosis not present

## 2019-06-09 DIAGNOSIS — G253 Myoclonus: Secondary | ICD-10-CM | POA: Diagnosis not present

## 2019-06-09 DIAGNOSIS — G250 Essential tremor: Secondary | ICD-10-CM

## 2019-06-09 NOTE — Patient Instructions (Signed)
1.  Stop your nighttime gabapentin - 300 mg.  Let me know if you have trouble with this. 2.  I have no objection if Dr. Allena Katz wants to increase your trazodone.

## 2019-06-09 NOTE — Procedures (Signed)
DBS Programming was performed.    Manufacturer of DBS device: [employer]tronic  Total time spent programming was 5 minutes.  Device was confirmed to be on.  Soft start was confirmed to be on.  Impedences were checked and were within normal limits.  Battery was checked and was determined to be functioning normally and not near the end of life.  Final settings were as follows:   Active Contacts Amplitude (V) PW (ms) Frequency (hz) Battery  Left Brain       06/09/19 1-0+ 2.5 90 160 2.97                       Right Brain       06/09/19 1-0+ 2.7 90 150 2.97

## 2019-06-10 ENCOUNTER — Other Ambulatory Visit: Payer: Self-pay | Admitting: Family Medicine

## 2019-06-19 ENCOUNTER — Telehealth: Payer: Self-pay | Admitting: *Deleted

## 2019-06-19 NOTE — Telephone Encounter (Signed)
CALLED  Patient and wife. Spoke to wife . Informed Dr Herbie Baltimore  Reviewed  EKG. Dr Herbie Baltimore states it was stable  And should be okay to titrate Trazodone dose.  Placed copy of comment in  Encounter. Dr Allena Katz will be able t review note.  "EKG reviewed from 05/23/2019: Shows sinus rhythm, rate 61 bpm.  Left axis deviation (-60).  There is now more notable QRS widening suggesting of incomplete LBBB.  Not really a significant change compared to prior EKG.  Previous EKG had some inferior T wave inversions in lead III that are no longer present.  Essentially this is stable EKG with no abnormal findings to be concerned of.  QTc was 422 PR interval is 186.  Should be okay for trazodone dose titration.  Bryan Lemma, MD"

## 2019-06-30 ENCOUNTER — Encounter: Payer: PPO | Admitting: Physical Medicine & Rehabilitation

## 2019-07-12 ENCOUNTER — Ambulatory Visit: Payer: PPO | Attending: Internal Medicine

## 2019-07-12 DIAGNOSIS — Z23 Encounter for immunization: Secondary | ICD-10-CM

## 2019-07-12 NOTE — Progress Notes (Signed)
   Covid-19 Vaccination Clinic  Name:  Brendan [name]    MRN: [id number] DOB: [birth year]  07/12/2019  [name] was observed post Covid-19 immunization for 15 minutes without incident. He was provided with Vaccine Information Sheet and instruction to access the V-Safe system.   [name] was instructed to call 911 with any severe reactions post vaccine: [name] Difficulty breathing  . Swelling of face and throat  . A fast heartbeat  . A bad rash all over body  . Dizziness and weakness   Immunizations Administered    Name Date Dose VIS Date Route   Pfizer COVID-19 Vaccine 07/12/2019  4:46 PM 0.3 mL 03/28/2019 Intramuscular   Manufacturer: [employer], [employer]   Lot: [id number]   NDC: [address]-5

## 2019-07-21 ENCOUNTER — Other Ambulatory Visit: Payer: Self-pay | Admitting: Family Medicine

## 2019-07-21 DIAGNOSIS — G629 Polyneuropathy, unspecified: Secondary | ICD-10-CM

## 2019-07-28 ENCOUNTER — Telehealth: Payer: Self-pay | Admitting: Neurology

## 2019-07-28 ENCOUNTER — Other Ambulatory Visit: Payer: Self-pay | Admitting: Cardiology

## 2019-07-28 ENCOUNTER — Other Ambulatory Visit: Payer: Self-pay | Admitting: *Deleted

## 2019-07-28 MED ORDER — PROPRANOLOL HCL 40 MG PO TABS: 40.0000 mg | ORAL_TABLET | Freq: Two times a day (BID) | ORAL | 3 refills | Status: DC

## 2019-07-28 NOTE — Telephone Encounter (Signed)
Patient's wife called with concerns patient's DBS may need adjusted. She said, "His leg is twitching again."   Patient will be in [place] this afternoon and hopes to come by the office for a quick visit.

## 2019-07-28 NOTE — Telephone Encounter (Signed)
Pt is still taken Gabapentin, they are asking if he could be seen tomorrow? Because they would have to go home and then come back out to be seen at 3:30 and tomorrow he has an appointment somewhere else around 36, they are "wanting something convenient for them"

## 2019-07-28 NOTE — Telephone Encounter (Signed)
Before we do that, find out if he is still on the gabapentin?  I doubt it is his stimulator.  I can check it at 3:30 but it will be a fast visit if they want to come let me look at it.

## 2019-07-28 NOTE — Telephone Encounter (Signed)
Spoke to pt wife  She stated that she will take his Gabapentin out of his pill box, she said that they just kept getting it refilled so he kept taken it, pt advised that per dr tat at last visit wast to STOP the Gabapentin, if jerking doesn't stop after pt has been off the Gabapentin for a few weeks they will call back and make an appointment

## 2019-07-28 NOTE — Telephone Encounter (Signed)
 *  STAT* If patient is at the pharmacy, call can be transferred to refill team.   1. Which medications need to be refilled? (please list name of each medication and dose if known)  propranolol (INDERAL) 40 MG tablet  2. Which pharmacy/location (including street and city if local pharmacy) is medication to be sent to?  [facility] - [place] - [address] [address]   3. Do they need a 30 day or 90 day supply? 30  Wife of patient was calling to check on the status of this refill

## 2019-07-28 NOTE — Telephone Encounter (Signed)
See prior note.  He was supposed to stop the gabapentin last visit.  I think that is causing the jerking.  He doesn't therefore need a visit.  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

## 2019-07-29 ENCOUNTER — Encounter: Payer: PPO | Attending: Physical Medicine & Rehabilitation | Admitting: Physical Medicine & Rehabilitation

## 2019-07-29 ENCOUNTER — Encounter: Payer: Self-pay | Admitting: Physical Medicine & Rehabilitation

## 2019-07-29 ENCOUNTER — Other Ambulatory Visit: Payer: Self-pay

## 2019-07-29 VITALS — BP 162/74 | HR 60 | Temp 98.5°F | Ht 66.0 in | Wt 182.2 lb

## 2019-07-29 DIAGNOSIS — I619 Nontraumatic intracerebral hemorrhage, unspecified: Secondary | ICD-10-CM

## 2019-07-29 DIAGNOSIS — R339 Retention of urine, unspecified: Secondary | ICD-10-CM | POA: Insufficient documentation

## 2019-07-29 DIAGNOSIS — F09 Unspecified mental disorder due to known physiological condition: Secondary | ICD-10-CM | POA: Diagnosis not present

## 2019-07-29 DIAGNOSIS — I10 Essential (primary) hypertension: Secondary | ICD-10-CM | POA: Diagnosis not present

## 2019-07-29 DIAGNOSIS — G250 Essential tremor: Secondary | ICD-10-CM

## 2019-07-29 DIAGNOSIS — G479 Sleep disorder, unspecified: Secondary | ICD-10-CM | POA: Diagnosis not present

## 2019-07-29 DIAGNOSIS — N319 Neuromuscular dysfunction of bladder, unspecified: Secondary | ICD-10-CM | POA: Diagnosis not present

## 2019-07-29 MED ORDER — TRAZODONE HCL 100 MG PO TABS: 100.0000 mg | ORAL_TABLET | Freq: Every day | ORAL | 1 refills | Status: DC

## 2019-07-29 NOTE — Progress Notes (Addendum)
Subjective:    Patient ID: Brendan [name], male    DOB: 07/23/1940, 79 y.o.   MRN: [id number]  HPI Right-handed male with history of CAD and stenting as well as CABG, memory loss with bilateral deep brain stimulator placement presents for follow up for left frontal intraparenchymal hemorrhage, DBS, debility.  Last clinic visit on 05/06/19.  Wife supplements history. Since that time, he has been doing some HEP. He is taking Robaxin 1 at night. He obtained ECG. He is now cathing 1/day. BP is elevated, but within normal limits at home per wife. LUE tremor has increased.  He was told by Neuro to stop Gabapentin.   Pain Inventory Average Pain 2 Pain Right Now 2 My pain is dull, tingling and aching  In the last 24 hours, has pain interfered with the following? General activity 0 Relation with others 0 Enjoyment of life 0 What TIME of day is your pain at its worst? night Sleep (in general) Poor  Pain is worse with: some activites Pain improves with: medication Relief from Meds: 8  Mobility walk without assistance ability to climb steps?  yes do you drive?  yes  Function retired  Neuro/Psych bladder control problems weakness  Prior Studies Any changes since last visit?  no  Physicians involved in your care Primary care .   Family History  Problem Relation Age of Onset  . Heart disease Mother   . Heart disease Brother   . Tremor Paternal Uncle   . Heart disease Brother   . Drug abuse Daughter   . Hepatitis C Daughter    Social History   Socioeconomic History  . Marital status: Married    Spouse name: [name]  . Number of children: 2  . Years of education: 10  . Highest education level: 10th grade  Occupational History  . Occupation: retired    Comment: Geneticist, molecular - facilities management x 30 years  Tobacco Use  . Smoking status: Former Smoker    Packs/day: 3.00    Years: 20.00    Pack years: 60.00    Types: Cigarettes    Quit date: 04/17/1968    Years since  quitting: 51.3  . Smokeless tobacco: Never Used  Substance and Sexual Activity  . Alcohol use: No  . Drug use: No  . Sexual activity: Never  Other Topics Concern  . Not on file  Social History Narrative   Married.  Wife is Royal Piedra.   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   Former smoker, quit 20 years ago. Does not drink alcohol.   Does not get routine exercise, walks sometimes with a cane.   Caffeine use: diet-soda   Left handed    Social Determinants of Health   Financial Resource Strain:   . Difficulty of Paying Living Expenses:   Food Insecurity:   . Worried About Programme researcher, broadcasting/film/video in the Last Year:   . Barista in the Last Year:   Transportation Needs:   . Freight forwarder (Medical):   [name] Lack of Transportation (Non-Medical):   Physical Activity:   . Days of Exercise per Week:   . Minutes of Exercise per Session:   Stress:   . Feeling of Stress :   Social Connections:   . Frequency of Communication with Friends and Family:   . Frequency of Social Gatherings with Friends and Family:   . Attends Religious Services:   . Active Member of Clubs or Organizations:   .  Attends Banker Meetings:   [name] Marital Status:    Past Surgical History:  Procedure Laterality Date  . CARDIAC CATHETERIZATION    . CATARACT EXTRACTION, BILATERAL    . CORONARY ARTERY BYPASS GRAFT  06/2002   LIMA-LAD, SVG-D1, SVG-OM  . EYE SURGERY    . KNEE SURGERY Left   . LEFT HEART CATHETERIZATION WITH CORONARY/GRAFT ANGIOGRAM N/A 10/13/2011   Procedure: LEFT HEART CATHETERIZATION WITH Isabel Caprice;  Surgeon: Marykay Lex, MD;  Location: [facility] CATH LAB;  Service: Cardiovascular;  Laterality: N/A;  . MINOR PLACEMENT OF FIDUCIAL N/A 04/26/2017   Procedure: Fiducial placement;  Surgeon: Maeola Harman, MD;  Location: [facility] OR;  Service: Neurosurgery;  Laterality: N/A;  Fiducial placement  . NM MYOCAR PERF WALL MOTION  03/23/2009   protocol:Bruce, normal  perfusion in all regions, post-stress EF 72%, exercise capacity , EKG negative for ischemia.  [name] PULSE GENERATOR IMPLANT Bilateral 11/13/2017   Procedure: Bilateral Implantable pulse generator;  Surgeon: Maeola Harman, MD;  Location: [facility] OR;  Service: Neurosurgery;  Laterality: Bilateral;  Bilateral Implantable pulse generator  . Shoulder Orthoscopic Surgery   06/2003  . SUBTHALAMIC STIMULATOR INSERTION Bilateral 05/04/2017   Procedure: Bilateral Deep brain stimulator placement;  Surgeon: Maeola Harman, MD;  Location: [facility] OR;  Service: Neurosurgery;  Laterality: Bilateral;  Bilateral deep brain stimulator placement  . TRANSTHORACIC ECHOCARDIOGRAM  04/2017   Jan 2019: Normal LV size and function.  EF 55-60%.  No or W MA.  Mild LA dilation.  . wrist ganglion cyst Left    Past Medical History:  Diagnosis Date  . Anxiety disorder   . Benign enlargement of prostate   . CAD in native artery 1997   Referred for CABG x3 in 2004 for LAD diagonal bifurcation lesion  . Cervical spinal stenosis   . Complex regional pain syndrome of right upper extremity    Right wrist; L arm  . Coronary atherosclerosis of artery bypass graft June 2013   Occluded SVG-D1; Cardiologist Dr. Herbie Baltimore  . Diabetes mellitus without complication (HCC)   . Gait disorder   . Gastroesophageal reflux disease   . Glaucoma   . Hiatal hernia    GI: Dr Evette Cristal  . Hyperlipidemia LDL goal <70   . Hypertension   . Memory loss    Mild  . Obesity   . Peripheral neuropathy    possible peripheral neuropathy  . S/P CABG x 3 2004    LIMA-LAD, SVG to diagonal, SVG to OM  . Tremor, essential    On Primidone  . Unstable angina pectoris [facility])  June 2013   Cardiac cath: occluded SVG-DI. Patent LIMA-LAD and SVG-OM. EF 45% with apical inferior HK.   BP (!) 162/74   Pulse 60   Temp 98.5 F (36.9 C)   Ht 5\' 6"  (1.676 m)   Wt 182 lb 3.2 oz (82.6 kg)   SpO2 96%   BMI 29.41 kg/m   Opioid Risk Score:   Fall Risk Score:   `1  Depression screen PHQ 2/9  Depression screen [facility] 2/9 03/28/2019 05/03/2018 04/23/2018 12/28/2017 12/05/2017 11/28/2017 08/29/2017  Decreased Interest 0 0 0 0 0 0 0  Down, Depressed, Hopeless 0 0 0 0 0 0 0  PHQ - 2 Score 0 0 0 0 0 0 0  Some recent data might be hidden   Review of Systems  Constitutional: Negative.   HENT: Negative.   Eyes: Negative.   Respiratory: Negative.   Cardiovascular: Negative.   Gastrointestinal:  Negative.   Endocrine: Negative.   Genitourinary: Negative.   Skin: Negative.   Allergic/Immunologic: Negative.   Neurological: Positive for weakness.  Hematological: Negative.       Objective:   Physical Exam General: NAD. Well-developed.  Resp: CTA bilaterally. Normal effort   Gait: Wide based Neuro: Alert Motor: Strength normal LUE ataxia with tremor    Assessment & Plan:  Right-handed male with history of CAD and stenting as well as CABG, memory loss with bilateral deep brain stimulator placement presents for follow up for left frontal intraparenchymal hemorrhage, DBS, debility.  1. Decreased functional mobility secondary to left frontal intraparenchymal hemorrhage with recent deep brain stimulator for tremor.    Continue HEP  Continue follow up with Neurology  Tremor had essentially resolved - long process with complications, patient believes it was all worth it, wife disagrees. Only when patient grabs something with RUE, however now pronounced tremor again in LUE.  He is driving again  2. Pain:    Mainly in b/l LE  Continue Robaxin 500 , ~1/night  Cont tylenol as needed  3. Tremors:  See #1  Wife to follow up with Neuro if no improvement after stopping Gabapentin.  4. Sleep disturbance    Continue Melatonin   Educated on sleep hygiene  ECG personally reviewed, no longer prolonged Qtc  Will increase Trazodone to 100 qhs - rarely using tramadol (~1/week)  5.  Neurogenic bladder  Cont Flomax   Cont follow up with Urology  Cont I/O caths,  now, usually 1/day  6. HTN  Elevated today, however, relatively controlled at home per wife

## 2019-08-05 ENCOUNTER — Ambulatory Visit: Payer: PPO | Attending: Internal Medicine

## 2019-08-05 ENCOUNTER — Telehealth: Payer: Self-pay | Admitting: Neurology

## 2019-08-05 DIAGNOSIS — Z23 Encounter for immunization: Secondary | ICD-10-CM

## 2019-08-05 NOTE — Telephone Encounter (Signed)
Patients wife called stating that he is shaking badly. She was told to have him stop taking gabapentin, she stopped that last week but he is still shaking. Please call.

## 2019-08-05 NOTE — Telephone Encounter (Signed)
Please advise, Needs to be seen?

## 2019-08-05 NOTE — Telephone Encounter (Signed)
Will place on the wait list

## 2019-08-05 NOTE — Progress Notes (Signed)
   Covid-19 Vaccination Clinic  Name:  Brendan [name]    MRN: [id number] DOB: 10-18-1940  08/05/2019  [name] was observed post Covid-19 immunization for 15 minutes without incident. He was provided with Vaccine Information Sheet and instruction to access the V-Safe system.   [name] was instructed to call 911 with any severe reactions post vaccine: [name] Difficulty breathing  . Swelling of face and throat  . A fast heartbeat  . A bad rash all over body  . Dizziness and weakness   Immunizations Administered    Name Date Dose VIS Date Route   Pfizer COVID-19 Vaccine 08/05/2019 11:51 AM 0.3 mL 06/11/2018 Intramuscular   Manufacturer: [employer], [employer]   Lot: [id number]   NDC: [address]-8

## 2019-08-05 NOTE — Telephone Encounter (Signed)
Please let pt know that we have them on cx list for appt

## 2019-08-05 NOTE — Telephone Encounter (Signed)
They were also offered a same day appt but declined it.  Will need to be put on cx list for DBS appt.

## 2019-08-06 NOTE — Progress Notes (Signed)
Assessment/Plan:    1.   Essential Tremor             Patient is status post bilateral DBS to the bilateral VIM on May 04, 2017.  Patient had postoperative bleed on the left, with resultant hematoma causing paralysis on the right, which has since resolved.  Because of complications, the patient's IPG was not placed until July, 2019.  device was off on the L when he came in today.  Turned device back on and tremor resolved.    2.  History of myoclonus.             -Patient is off of gabapentin and told him to stay off of this.  No myoclonus noted  3.  Insomnia             -Following with Dr. Allena Katz.  I reviewed his EKG.  I certainly have no objections to his trazodone.  Subjective:   Brendan [name] was seen today in follow up for tremor.  Last visit, he was describing some myoclonus.  I told him to go ahead and stop the gabapentin.  It turns out that he did not do that.  They contacted me on April 12 about the movements.  He was offered a same-day appointment, but that was declined.  We did learn at that time that he was still on the gabapentin.  We told him to stop that.  He did, but they recently contacted me and stated that he was having tremor, so he was worked in today.  States today that tremor started about 3 weeks ago.  Wife checked DBS and it was on and seemed to be functioning normally.   ALLERGIES:   Allergies  Allergen Reactions  . Lisinopril Cough    CURRENT MEDICATIONS:  Outpatient Encounter Medications as of 08/07/2019  Medication Sig  . aspirin EC 81 MG tablet Take 81 mg by mouth daily.  [name] atorvastatin (LIPITOR) 40 MG tablet Take 1 tablet (40 mg total) by mouth daily.  . fluticasone (FLONASE) 50 MCG/ACT nasal spray USE 2 SPRAYS IN BOTH NOSTRILS DAILY  . furosemide (LASIX) 20 MG tablet TAKE 1 TABLET BY MOUTH DAILY, TAKE AN EXTRA DOSE IF NEEDED  . loratadine (ALLERGY RELIEF) 10 MG tablet Take 1 tablet (10 mg total) by mouth daily.  . Melatonin 3 MG TABS Take 1  tablet (3 mg total) by mouth at bedtime.  . methocarbamol (ROBAXIN) 750 MG tablet TAKE 1 TABLET BY MOUTH TWICE DAILY AS NEEDED FOR MUSCLE SPASMS  . nitroGLYCERIN (NITROSTAT) 0.4 MG SL tablet Place 1 tablet (0.4 mg total) under the tongue every 5 (five) minutes as needed for chest pain. call 911 if chest pain not better  . omeprazole (PRILOSEC) 20 MG capsule Take 1 capsule (20 mg total) by mouth daily as needed (acid reflux).  [name] PROAIR HFA 108 (90 Base) MCG/ACT inhaler INHALE 2 PUFFS INTO THE LUNGS 4 TIMES DAILY AS NEEDED  . propranolol (INDERAL) 40 MG tablet Take 1 tablet (40 mg total) by mouth 2 (two) times daily.  . traMADol (ULTRAM) 50 MG tablet TAKE 1 TABLET BY MOUTH EVERY 6 HOURS AS NEEDED FOR MODERATE PAIN  . traZODone (DESYREL) 100 MG tablet Take 1 tablet (100 mg total) by mouth at bedtime.  . [DISCONTINUED] ketoconazole (NIZORAL) 2 % shampoo Apply 1 application topically once a week. (Patient not taking: Reported on 08/07/2019)   Facility-Administered Encounter Medications as of 08/07/2019  Medication  . 0.9 %  sodium chloride infusion  . sodium chloride 0.9 % injection 3 mL     Objective:    PHYSICAL EXAMINATION:    VITALS:   Vitals:   08/07/19 0848  BP: (!) 147/69  Pulse: (!) 55  SpO2: 97%  Weight: 181 lb (82.1 kg)  Height: 5\' 6"  (1.676 m)    GEN:  The patient appears stated age and is in NAD. HEENT:  Normocephalic, atraumatic.  The mucous membranes are moist. The superficial temporal arteries are without ropiness or tenderness. CV:  RRR Lungs:  CTAB Neck/HEME:  There are no carotid bruits bilaterally.  Neurological examination:  Orientation: The patient is alert and oriented x3. Cranial nerves: There is good facial symmetry. The speech is fluent and clear. Soft palate rises symmetrically and there is no tongue deviation. Hearing is intact to conversational tone. Sensation: Sensation is intact to light touch throughout Motor: Strength is at least antigravity  x4.  Movement examination: Tone: There is normal tone in the UE/LE (does have trouble relaxing) Abnormal movements: Prior to programming today, there is rare rest tremor of the R thumb.  There is LUE rest tremor.  There is postural tremor on the L.  There is intention tremor on the L.  Post programming, there was no tremor on either side. Coordination:  There is no decremation with RAM's Gait and Station: The patient ambulates well.  I have reviewed and interpreted the following labs independently   Chemistry      Component Value Date/Time   NA 143 02/04/2019 1534   K 3.9 02/04/2019 1534   CL 104 02/04/2019 1534   CO2 25 02/04/2019 1534   BUN 9 02/04/2019 1534   CREATININE 1.09 02/04/2019 1534   CREATININE 1.02 05/01/2016 1118      Component Value Date/Time   CALCIUM 9.2 02/04/2019 1534   ALKPHOS 159 (H) 05/06/2019 1304   AST 20 05/06/2019 1304   ALT 20 05/06/2019 1304   BILITOT 1.1 05/06/2019 1304      Total time spent on today's visit was 20 minutes, including both face-to-face time and nonface-to-face time.  Time included that spent on review of records (prior notes available to me/labs/imaging if pertinent), discussing treatment and goals, answering patient's questions and coordinating care.  This didn't include dbs time  Cc:  Autry-Lott, Naaman Plummer, DO

## 2019-08-06 NOTE — Telephone Encounter (Signed)
Pt has an appointment for 08/07/19 with Dr Arbutus Leas

## 2019-08-07 ENCOUNTER — Encounter: Payer: Self-pay | Admitting: Neurology

## 2019-08-07 ENCOUNTER — Other Ambulatory Visit: Payer: Self-pay

## 2019-08-07 ENCOUNTER — Ambulatory Visit (INDEPENDENT_AMBULATORY_CARE_PROVIDER_SITE_OTHER): Payer: PPO | Admitting: Neurology

## 2019-08-07 VITALS — BP 147/69 | HR 55 | Ht 66.0 in | Wt 181.0 lb

## 2019-08-07 DIAGNOSIS — G250 Essential tremor: Secondary | ICD-10-CM | POA: Diagnosis not present

## 2019-08-07 DIAGNOSIS — G253 Myoclonus: Secondary | ICD-10-CM | POA: Diagnosis not present

## 2019-08-07 NOTE — Procedures (Signed)
DBS Programming was performed.    Manufacturer of DBS device: [employer]  Total time spent programming was 25 minutes.  Device was confirmed to be on on the left but it was OFF on the right.  Device was subsequently turned on.  Soft start was confirmed to be on.  Impedences were checked and were within normal limits.  Battery was checked and was determined to be functioning normally and not near the end of life.  Final settings were as follows:   Active Contacts Amplitude (V) PW (ms) Frequency (hz) Battery  Left Brain       08/07/19 1-0+ 3.3 90 160 2.97                       Right Brain       08/07/19 1-0+ 2.7 90 150 2.98

## 2019-08-13 ENCOUNTER — Other Ambulatory Visit: Payer: Self-pay

## 2019-08-13 ENCOUNTER — Ambulatory Visit (INDEPENDENT_AMBULATORY_CARE_PROVIDER_SITE_OTHER): Payer: PPO

## 2019-08-13 VITALS — Wt 187.0 lb

## 2019-08-13 DIAGNOSIS — Z0000 Encounter for general adult medical examination without abnormal findings: Secondary | ICD-10-CM | POA: Diagnosis not present

## 2019-08-13 NOTE — Progress Notes (Addendum)
I have reviewed this visit and agree with the documentation.  Lavonda Jumbo, DO  Family Medicine PGY-1     Subjective:   Brendan [name] is a 79 y.o. male who presents for Medicare Annual/Subsequent preventive examination.  The patient consented to a virtual visit.   Review of Systems: Defer to PCP.    Cardiac Risk Factors include: advanced age (>65men, >33 women);male gender  Objective:    Vitals: Wt 187 lb (84.8 kg)   BMI 30.18 kg/m   Body mass index is 30.18 kg/m.  Advanced Directives 08/13/2019 08/07/2019 06/09/2019 03/28/2019 12/06/2018 04/23/2018 12/05/2017  Does Patient Have a Medical Advance Directive? No No No No Yes No Yes  Type of Advance Directive - - - - Living will;Healthcare Power of Engineer, technical sales  Does patient want to make changes to medical advance directive? - - - - - - -  Copy of Midwife in Chart? - - - - - - No - copy requested  Would patient like information on creating a medical advance directive? Yes (MAU/Ambulatory/Procedural Areas - Information given) - - No - Patient declined - No - Patient declined -  Pre-existing out of facility DNR order (yellow form or pink MOST form) - - - - - - -   Tobacco Social History   Tobacco Use  Smoking Status Former Smoker  . Packs/day: 3.00  . Years: 20.00  . Pack years: 60.00  . Types: Cigarettes  . Quit date: 04/17/1968  . Years since quitting: 51.3  Smokeless Tobacco Never Used     Counseling given: no plans to restart.  Clinical Intake:  Pre-visit preparation completed: Yes  How often do you need to have someone help you when you read instructions, pamphlets, or other written materials from your doctor or pharmacy?: 2 - Rarely What is the last grade level you completed in school?: 10th grade  Past Medical History:  Diagnosis Date  . Anxiety disorder   . Benign enlargement of prostate   . CAD in native artery 1997   Referred for CABG x3 in 2004  for LAD diagonal bifurcation lesion  . Cervical spinal stenosis   . Complex regional pain syndrome of right upper extremity    Right wrist; L arm  . Coronary atherosclerosis of artery bypass graft June 2013   Occluded SVG-D1; Cardiologist Dr. Herbie Baltimore  . Diabetes mellitus without complication (HCC)   . Gait disorder   . Gastroesophageal reflux disease   . Glaucoma   . Hiatal hernia    GI: Dr Evette Cristal  . Hyperlipidemia LDL goal <70   . Hypertension   . Memory loss    Mild  . Obesity   . Peripheral neuropathy    possible peripheral neuropathy  . S/P CABG x 3 2004    LIMA-LAD, SVG to diagonal, SVG to OM  . Tremor, essential    On Primidone  . Unstable angina pectoris [facility])  June 2013   Cardiac cath: occluded SVG-DI. Patent LIMA-LAD and SVG-OM. EF 45% with apical inferior HK.   Past Surgical History:  Procedure Laterality Date  . CARDIAC CATHETERIZATION    . CATARACT EXTRACTION, BILATERAL    . CORONARY ARTERY BYPASS GRAFT  06/2002   LIMA-LAD, SVG-D1, SVG-OM  . EYE SURGERY    . KNEE SURGERY Left   . LEFT HEART CATHETERIZATION WITH CORONARY/GRAFT ANGIOGRAM N/A 10/13/2011   Procedure: LEFT HEART CATHETERIZATION WITH Isabel Caprice;  Surgeon: Marykay Lex, MD;  Location:  [facility] CATH LAB;  Service: Cardiovascular;  Laterality: N/A;  . MINOR PLACEMENT OF FIDUCIAL N/A 04/26/2017   Procedure: Fiducial placement;  Surgeon: Maeola Harman, MD;  Location[facility];  Service: Neurosurgery;  Laterality: N/A;  Fiducial placement  . NM MYOCAR PERF WALL MOTION  03/23/2009   protocol:Bruce, normal perfusion in all regions, post-stress EF 72%, exercise capacity , EKG negative for ischemia.  [name] PULSE GENERATOR IMPLANT Bilateral 11/13/2017   Procedure: Bilateral Implantable pulse generator;  Surgeon: Maeola Harman, MD;  Location: [facility] OR;  Service: Neurosurgery;  Laterality: Bilateral;  Bilateral Implantable pulse generator  . Shoulder Orthoscopic Surgery   06/2003  . SUBTHALAMIC STIMULATOR  INSERTION Bilateral 05/04/2017   Procedure: Bilateral Deep brain stimulator placement;  Surgeon: Maeola Harman, MD;  Location: [facility] OR;  Service: Neurosurgery;  Laterality: Bilateral;  Bilateral deep brain stimulator placement  . TRANSTHORACIC ECHOCARDIOGRAM  04/2017   Jan 2019: Normal LV size and function.  EF 55-60%.  No or W MA.  Mild LA dilation.  . wrist ganglion cyst Left    Family History  Problem Relation Age of Onset  . Heart disease Mother   . Heart disease Brother   . Tremor Paternal Uncle   . Heart disease Brother   . Drug abuse Daughter   . Hepatitis C Daughter    Social History   Socioeconomic History  . Marital status: Married    Spouse name: [name]  . Number of children: 2  . Years of education: 10  . Highest education level: 10th grade  Occupational History  . Occupation: retired    Comment: Geneticist, molecular - facilities management x 30 years  Tobacco Use  . Smoking status: Former Smoker    Packs/day: 3.00    Years: 20.00    Pack years: 60.00    Types: Cigarettes    Quit date: 04/17/1968    Years since quitting: 51.3  . Smokeless tobacco: Never Used  Substance and Sexual Activity  . Alcohol use: No  . Drug use: No  . Sexual activity: Not Currently  Other Topics Concern  . Not on file  Social History Narrative   Patient lives in [place] with his wife and one of his daughters.   Patients other daughter lives in [place].   Patient enjoys playing all sorts of games and spending time with his family.    Social Determinants of Health   Financial Resource Strain: Low Risk   . Difficulty of Paying Living Expenses: Not hard at all  Food Insecurity: No Food Insecurity  . Worried About Programme researcher, broadcasting/film/video in the Last Year: Never true  . Ran Out of Food in the Last Year: Never true  Transportation Needs: No Transportation Needs  . Lack of Transportation (Medical): No  . Lack of Transportation (Non-Medical): No  Physical Activity: Inactive  . Days of Exercise  per Week: 0 days  . Minutes of Exercise per Session: 0 min  Stress: No Stress Concern Present  . Feeling of Stress : Only a little  Social Connections: Slightly Isolated  . Frequency of Communication with Friends and Family: More than three times a week  . Frequency of Social Gatherings with Friends and Family: More than three times a week  . Attends Religious Services: More than 4 times per year  . Active Member of Clubs or Organizations: No  . Attends Banker Meetings: Never  . Marital Status: Married   Outpatient Encounter Medications as of 08/13/2019  Medication Sig  .  aspirin EC 81 MG tablet Take 81 mg by mouth daily.  [name] atorvastatin (LIPITOR) 40 MG tablet Take 1 tablet (40 mg total) by mouth daily.  . fluticasone (FLONASE) 50 MCG/ACT nasal spray USE 2 SPRAYS IN BOTH NOSTRILS DAILY  . furosemide (LASIX) 20 MG tablet TAKE 1 TABLET BY MOUTH DAILY, TAKE AN EXTRA DOSE IF NEEDED  . loratadine (ALLERGY RELIEF) 10 MG tablet Take 1 tablet (10 mg total) by mouth daily.  . Melatonin 3 MG TABS Take 1 tablet (3 mg total) by mouth at bedtime.  . methocarbamol (ROBAXIN) 750 MG tablet TAKE 1 TABLET BY MOUTH TWICE DAILY AS NEEDED FOR MUSCLE SPASMS  . nitroGLYCERIN (NITROSTAT) 0.4 MG SL tablet Place 1 tablet (0.4 mg total) under the tongue every 5 (five) minutes as needed for chest pain. call 911 if chest pain not better  . omeprazole (PRILOSEC) 20 MG capsule Take 1 capsule (20 mg total) by mouth daily as needed (acid reflux).  [name] PROAIR HFA 108 (90 Base) MCG/ACT inhaler INHALE 2 PUFFS INTO THE LUNGS 4 TIMES DAILY AS NEEDED  . propranolol (INDERAL) 40 MG tablet Take 1 tablet (40 mg total) by mouth 2 (two) times daily.  . traMADol (ULTRAM) 50 MG tablet TAKE 1 TABLET BY MOUTH EVERY 6 HOURS AS NEEDED FOR MODERATE PAIN  . traZODone (DESYREL) 100 MG tablet Take 1 tablet (100 mg total) by mouth at bedtime.   Facility-Administered Encounter Medications as of 08/13/2019  Medication  . 0.9 %   sodium chloride infusion  . sodium chloride 0.9 % injection 3 mL   Activities of Daily Living In your present state of health, do you have any difficulty performing the following activities: 08/13/2019  Hearing? Y  Vision? N  Difficulty concentrating or making decisions? N  Walking or climbing stairs? Y  Dressing or bathing? N  Doing errands, shopping? N  Preparing Food and eating ? N  Using the Toilet? N  In the past six months, have you accidently leaked urine? Y  Comment cath at bedtime  Do you have problems with loss of bowel control? N  Managing your Medications? N  Managing your Finances? N  Housekeeping or managing your Housekeeping? N  Some recent data might be hidden   Patient Care Team: Lavonda Jumbo, DO as PCP - General (Family Medicine) Marykay Lex, MD as PCP - Cardiology (Cardiology) Storm Frisk, MD as Consulting Physician (Pulmonary Disease) Elliot Cousin, OD as Referring Physician (Optometry) Marykay Lex, MD as Consulting Physician (Cardiology)   Assessment:  This is a routine wellness examination for Brendan [name].  Exercise Activities and Dietary recommendations Current Exercise Habits: The patient does not participate in regular exercise at present  Goals    . Blood Pressure < 150/90    . HEMOGLOBIN A1C < 7     6.3 03/2019      Fall Risk Fall Risk  08/13/2019 08/07/2019 07/29/2019 06/09/2019 05/06/2019  Falls in the past year? 0 0 0 0 0  Number falls in past yr: - 0 - 0 -  Injury with Fall? - 0 - 0 -  Risk for fall due to : - - - - -  Follow up - - - - -   Is the patient's home free of loose throw rugs in walkways, pet beds, electrical cords, etc?   yes      Grab bars in the bathroom? yes      Handrails on the stairs?   yes  Adequate lighting?   yes  Patient rating of health (0-10): 8  Depression Screen PHQ 2/9 Scores 08/13/2019 03/28/2019 05/03/2018 04/23/2018  PHQ - 2 Score 0 0 0 0   Cognitive Function  Montreal Cognitive  Assessment  06/11/2018 06/11/2018  Visuospatial/ Executive (0/5) 3 3  Naming (0/3) 2 2  Attention: Read list of digits (0/2) 1 1  Attention: Read list of letters (0/1) 1 1  Attention: Serial 7 subtraction starting at 100 (0/3) 3 3  Language: Repeat phrase (0/2) 0 0  Language : Fluency (0/1) 0 0  Abstraction (0/2) 0 0  Delayed Recall (0/5) 0 0  Orientation (0/6) 6 6  Total 16 16  Adjusted Score (based on education) 17 17   6CIT Screen 08/13/2019  What Year? 0 points  What month? 0 points  What time? 0 points  Count back from 20 0 points  Months in reverse 0 points  Repeat phrase 2 points  Total Score 2   Immunization History  Administered Date(s) Administered  . Fluad Quad(high Dose 65+) 01/03/2019  . Influenza Split 01/11/2011, 01/02/2012  . Influenza Whole 02/07/2008, 01/29/2009, 01/18/2010  . Influenza,inj,Quad PF,6+ Mos 01/31/2013, 01/12/2014, 01/06/2015, 01/05/2016, 01/10/2017, 12/31/2017  . PFIZER SARS-COV-2 Vaccination 07/12/2019, 08/05/2019  . Pneumococcal Conjugate-13 11/30/2015  . Pneumococcal Polysaccharide-23 11/15/1996, 02/27/2011, 05/08/2017  . Td 05/18/2002  . Tdap 04/23/2018  . Zoster 08/10/2011   Screening Tests Health Maintenance  Topic Date Due  . OPHTHALMOLOGY EXAM  Never done  . URINE MICROALBUMIN  Never done  . HEMOGLOBIN A1C  09/26/2019  . INFLUENZA VACCINE  11/16/2019  . FOOT EXAM  02/04/2020  . TETANUS/TDAP  04/23/2028  . COVID-19 Vaccine  Completed  . PNA vac Low Risk Adult  Completed   Cancer Screenings: Lung: Low Dose CT Chest recommended if Age 63-80 years, 30 pack-year currently smoking OR have quit w/in 15years. Patient does not qualify. Colorectal: Has done stool cards in the past 10 years.  Plan:  I am glad you got your Covid Vaccine! PCP visit scheduled for 6/15 @1 :45pm. I have mailed you an advance directive packet. Keep up the good work with your A1c!  I have personally reviewed and noted the following in the patient's chart:     . Medical and social history . Use of alcohol, tobacco or illicit drugs  . Current medications and supplements . Functional ability and status . Nutritional status . Physical activity . Advanced directives . List of other physicians . Hospitalizations, surgeries, and ER visits in previous 12 months . Vitals . Screenings to include cognitive, depression, and falls . Referrals and appointments  In addition, I have reviewed and discussed with patient certain preventive protocols, quality metrics, and best practice recommendations. A written personalized care plan for preventive services as well as general preventive health recommendations were provided to patient.  This visit was conducted virtually in the setting of the [place] pandemic.   Dorna Bloom, [place]  08/13/2019

## 2019-08-13 NOTE — Patient Instructions (Addendum)
You spoke to Brendan [name], [place] over the phone for your annual wellness visit.  We discussed goals: Goals    . Blood Pressure < 150/90    . HEMOGLOBIN A1C < 7     6.3 03/2019      We also discussed recommended health maintenance.  As discussed, you are pretty much up to date with everything. We can obtain a release of records for your reported eye exam.   Health Maintenance  Topic Date Due  . OPHTHALMOLOGY EXAM  Never done  . URINE MICROALBUMIN  Never done  . HEMOGLOBIN A1C  09/26/2019  . INFLUENZA VACCINE  11/16/2019  . FOOT EXAM  02/04/2020  . TETANUS/TDAP  04/23/2028  . COVID-19 Vaccine  Completed  . PNA vac Low Risk Adult  Completed   I am glad you got your Covid Vaccine! PCP visit scheduled for 6/15 '@1'$ :45pm. I have mailed you an advance directive packet. Keep up the good work with your A1c!  Preventive Care 85 Years and Older, Male Preventive care refers to lifestyle choices and visits with your health care provider that can promote health and wellness. This includes:  A yearly physical exam. This is also called an annual well check.  Regular dental and eye exams.  Immunizations.  Screening for certain conditions.  Healthy lifestyle choices, such as diet and exercise. What can I expect for my preventive care visit? Physical exam Your health care provider will check:  Height and weight. These may be used to calculate body mass index (BMI), which is a measurement that tells if you are at a healthy weight.  Heart rate and blood pressure.  Your skin for abnormal spots. Counseling Your health care provider may ask you questions about:  Alcohol, tobacco, and drug use.  Emotional well-being.  Home and relationship well-being.  Sexual activity.  Eating habits.  History of falls.  Memory and ability to understand (cognition).  Work and work Statistician. What immunizations do I need?  Influenza (flu) vaccine  This is recommended every year. Tetanus,  diphtheria, and pertussis (Tdap) vaccine  You may need a Td booster every 10 years. Varicella (chickenpox) vaccine  You may need this vaccine if you have not already been vaccinated. Zoster (shingles) vaccine  You may need this after age 13. Pneumococcal conjugate (PCV13) vaccine  One dose is recommended after age 42. Pneumococcal polysaccharide (PPSV23) vaccine  One dose is recommended after age 30. Measles, mumps, and rubella (MMR) vaccine  You may need at least one dose of MMR if you were born in 1957 or later. You may also need a second dose. Meningococcal conjugate (MenACWY) vaccine  You may need this if you have certain conditions. Hepatitis A vaccine  You may need this if you have certain conditions or if you travel or work in places where you may be exposed to hepatitis A. Hepatitis B vaccine  You may need this if you have certain conditions or if you travel or work in places where you may be exposed to hepatitis B. Haemophilus influenzae type b (Hib) vaccine  You may need this if you have certain conditions. You may receive vaccines as individual doses or as more than one vaccine together in one shot (combination vaccines). Talk with your health care provider about the risks and benefits of combination vaccines. What tests do I need? Blood tests  Lipid and cholesterol levels. These may be checked every 5 years, or more frequently depending on your overall health.  Hepatitis C  test.  Hepatitis B test. Screening  Lung cancer screening. You may have this screening every year starting at age 31 if you have a 30-pack-year history of smoking and currently smoke or have quit within the past 15 years.  Colorectal cancer screening. All adults should have this screening starting at age 67 and continuing until age 44. Your health care provider may recommend screening at age 69 if you are at increased risk. You will have tests every 1-10 years, depending on your results and  the type of screening test.  Prostate cancer screening. Recommendations will vary depending on your family history and other risks.  Diabetes screening. This is done by checking your blood sugar (glucose) after you have not eaten for a while (fasting). You may have this done every 1-3 years.  Abdominal aortic aneurysm (AAA) screening. You may need this if you are a current or former smoker.  Sexually transmitted disease (STD) testing. Follow these instructions at home: Eating and drinking  Eat a diet that includes fresh fruits and vegetables, whole grains, lean protein, and low-fat dairy products. Limit your intake of foods with high amounts of sugar, saturated fats, and salt.  Take vitamin and mineral supplements as recommended by your health care provider.  Do not drink alcohol if your health care provider tells you not to drink.  If you drink alcohol: ? Limit how much you have to 0-2 drinks a day. ? Be aware of how much alcohol is in your drink. In the [place] one drink equals one 12 oz bottle of beer (355 mL), one 5 oz glass of wine (148 mL), or one 1 oz glass of hard liquor (44 mL). Lifestyle  Take daily care of your teeth and gums.  Stay active. Exercise for at least 30 minutes on 5 or more days each week.  Do not use any products that contain nicotine or tobacco, such as cigarettes, e-cigarettes, and chewing tobacco. If you need help quitting, ask your health care provider.  If you are sexually active, practice safe sex. Use a condom or other form of protection to prevent STIs (sexually transmitted infections).  Talk with your health care provider about taking a low-dose aspirin or statin. What's next?  Visit your health care provider once a year for a well check visit.  Ask your health care provider how often you should have your eyes and teeth checked.  Stay up to date on all vaccines. This information is not intended to replace advice given to you by your health care  provider. Make sure you discuss any questions you have with your health care provider. Document Revised: 03/28/2018 Document Reviewed: 03/28/2018 Elsevier Patient Education  2020 [place] clinic's number is [phone]. Please call with questions or concerns about what we discussed today.

## 2019-08-15 DIAGNOSIS — I619 Nontraumatic intracerebral hemorrhage, unspecified: Secondary | ICD-10-CM | POA: Diagnosis not present

## 2019-08-15 DIAGNOSIS — R339 Retention of urine, unspecified: Secondary | ICD-10-CM | POA: Diagnosis not present

## 2019-08-15 DIAGNOSIS — G8110 Spastic hemiplegia affecting unspecified side: Secondary | ICD-10-CM | POA: Diagnosis not present

## 2019-08-18 ENCOUNTER — Other Ambulatory Visit: Payer: Self-pay | Admitting: Family Medicine

## 2019-08-18 DIAGNOSIS — K219 Gastro-esophageal reflux disease without esophagitis: Secondary | ICD-10-CM

## 2019-08-19 DIAGNOSIS — R339 Retention of urine, unspecified: Secondary | ICD-10-CM | POA: Diagnosis not present

## 2019-08-19 DIAGNOSIS — I619 Nontraumatic intracerebral hemorrhage, unspecified: Secondary | ICD-10-CM | POA: Diagnosis not present

## 2019-08-19 DIAGNOSIS — G8110 Spastic hemiplegia affecting unspecified side: Secondary | ICD-10-CM | POA: Diagnosis not present

## 2019-09-20 ENCOUNTER — Other Ambulatory Visit: Payer: Self-pay | Admitting: Physical Medicine & Rehabilitation

## 2019-09-30 ENCOUNTER — Encounter: Payer: Self-pay | Admitting: Family Medicine

## 2019-09-30 ENCOUNTER — Other Ambulatory Visit: Payer: Self-pay

## 2019-09-30 ENCOUNTER — Ambulatory Visit (INDEPENDENT_AMBULATORY_CARE_PROVIDER_SITE_OTHER): Payer: PPO | Admitting: Family Medicine

## 2019-09-30 VITALS — BP 122/66 | HR 70 | Wt 177.0 lb

## 2019-09-30 DIAGNOSIS — R600 Localized edema: Secondary | ICD-10-CM | POA: Diagnosis not present

## 2019-09-30 DIAGNOSIS — E119 Type 2 diabetes mellitus without complications: Secondary | ICD-10-CM

## 2019-09-30 DIAGNOSIS — Z0000 Encounter for general adult medical examination without abnormal findings: Secondary | ICD-10-CM

## 2019-09-30 DIAGNOSIS — I10 Essential (primary) hypertension: Secondary | ICD-10-CM | POA: Diagnosis not present

## 2019-09-30 LAB — POCT GLYCOSYLATED HEMOGLOBIN (HGB A1C): HbA1c, POC (controlled diabetic range): 7 % (ref 0.0–7.0)

## 2019-09-30 MED ORDER — FUROSEMIDE 20 MG PO TABS: ORAL_TABLET | ORAL | 3 refills | Status: DC

## 2019-09-30 NOTE — Assessment & Plan Note (Addendum)
-  Hep C negative -Obtain optho records -Obtain micro albumin at next visit

## 2019-09-30 NOTE — Progress Notes (Signed)
    SUBJECTIVE:   CHIEF COMPLAINT / HPI:   Diabetes mellitus, Type 2 No concerns today. Disease Monitoring             Does not check his blood sugars regularly at home.              Polyuria: Does not endorse; urinates regularly during the day and wife  cath x1 nightly d/t patient's neurogenic bladder             Visual problems: no   Urine Microalbumin Collected 02/04/19 (no result); ordered 03/27/2020  order expired; needs to be ordered at next follow up  Last A1C: 6.3 03/28/2019  Diet: Balanced  Medications: not on any medication  Preventitive Health Care             Eye Exam: Wife will send records from optho appt 2020             Foot Exam: 02/04/19; to be repeated at next follow up  Hypertension w/ hx of hypotension Medications: ASA 81, lipitor 40mg , lasix 20mg  twice weekly (for LE edema),  Propranolol 40 mg (for tremor) Compliance: Compliant with all medications Endorses mild dizziness with standing too fast that quickly resolves on its own Denies any SOB, CP, vision changes, LE edema   PERTINENT  PMH / PSH: Chronic stable angina, s/p CABG x3 2013 (sees Dr. ), Essential tremor, peripheral neuropathy, BPH, Hx of CVA, gait disorder (sees [facility])  OBJECTIVE:   BP 122/66   Pulse 70   Wt 80.3 kg   BMI 28.57 kg/m   General: Appears well, no acute distress. Age appropriate. Hard of hearing. Wife present and attentive Cardiac: RRR, normal but distant heart sounds, no murmurs Respiratory: CTAB, normal effort Extremities: No edema or cyanosis. No LE venous stasis symptoms  ASSESSMENT/PLAN:   Diabetes mellitus without complication (HCC) POCT A1c is 7.0 From 6.3. Goal is <8 -Continue current diet -Follow up in 6 months  -Return sooner if needed or if increase of hunger, thirst, or urination -Please obtain micro/albumin at follow up visit  Essential hypertension BP at goal. Has some dizziness with standing. Encourage to stand slower and feel balance  before walking. Could consider decreasing beta blocker if this continues to be an issue. -Follow up as needed, will continue to reassess with subsequent visits  Healthcare maintenance -Hep C negative -Obtain optho records -Obtain micro albumin at next visit   2014, DO [facility]

## 2019-09-30 NOTE — Assessment & Plan Note (Addendum)
BP at goal. Has some dizziness with standing. Encourage to stand slower and feel balance before walking. Could consider decreasing beta blocker if this continues to be an issue. -Follow up as needed, will continue to reassess with subsequent visits

## 2019-09-30 NOTE — Assessment & Plan Note (Addendum)
POCT A1c is 7.0 From 6.3. Goal is <8 -Continue current diet -Follow up in 6 months  -Return sooner if needed or if increase of hunger, thirst, or urination -Please obtain micro/albumin at follow up visit

## 2019-09-30 NOTE — Patient Instructions (Signed)
It was very nice to meet you today. Please enjoy the rest of your week. Today you were seen for your diabetes. Will call with abnormal results, if it is normal will send a letter.   Please call the clinic at [phone] if you have any concerns. It was our pleasure to serve you.

## 2019-10-01 LAB — HEPATITIS C ANTIBODY (REFLEX): HCV Ab: <0.1 s/co ratio (ref 0.0–0.9)

## 2019-10-01 LAB — HCV COMMENT:

## 2019-10-02 ENCOUNTER — Encounter: Payer: Self-pay | Admitting: Family Medicine

## 2019-10-16 ENCOUNTER — Other Ambulatory Visit: Payer: Self-pay | Admitting: Family Medicine

## 2019-10-16 DIAGNOSIS — J302 Other seasonal allergic rhinitis: Secondary | ICD-10-CM

## 2019-10-16 DIAGNOSIS — G629 Polyneuropathy, unspecified: Secondary | ICD-10-CM

## 2019-10-30 ENCOUNTER — Other Ambulatory Visit: Payer: Self-pay

## 2019-10-30 ENCOUNTER — Encounter: Payer: Self-pay | Admitting: Physical Medicine & Rehabilitation

## 2019-10-30 ENCOUNTER — Encounter: Payer: PPO | Attending: Physical Medicine & Rehabilitation | Admitting: Physical Medicine & Rehabilitation

## 2019-10-30 VITALS — BP 136/75 | HR 63 | Temp 98.4°F | Ht 66.0 in | Wt 180.0 lb

## 2019-10-30 DIAGNOSIS — N319 Neuromuscular dysfunction of bladder, unspecified: Secondary | ICD-10-CM | POA: Diagnosis not present

## 2019-10-30 DIAGNOSIS — G479 Sleep disorder, unspecified: Secondary | ICD-10-CM

## 2019-10-30 DIAGNOSIS — G250 Essential tremor: Secondary | ICD-10-CM | POA: Diagnosis not present

## 2019-10-30 DIAGNOSIS — F09 Unspecified mental disorder due to known physiological condition: Secondary | ICD-10-CM | POA: Diagnosis not present

## 2019-10-30 DIAGNOSIS — R339 Retention of urine, unspecified: Secondary | ICD-10-CM

## 2019-10-30 DIAGNOSIS — I619 Nontraumatic intracerebral hemorrhage, unspecified: Secondary | ICD-10-CM | POA: Diagnosis not present

## 2019-10-30 DIAGNOSIS — I10 Essential (primary) hypertension: Secondary | ICD-10-CM | POA: Diagnosis not present

## 2019-10-30 NOTE — Progress Notes (Signed)
Subjective:    Patient ID: Brendan [name], male    DOB: Jul 15, 1940, 79 y.o.   MRN: [id number]  HPI Right-handed male with history of CAD and stenting as well as CABG, memory loss with bilateral deep brain stimulator placement presents for follow up for left frontal intraparenchymal hemorrhage, DBS, debility.  Wife supplements history. Last clinic visit on 07/29/19.  Since that time, pt states he is doing limited exercises. States his foot got caught in a hole and he fell. He states he is getting better. He is following up with Neurology. Mild LUE tremor.  He is driving without issue.  Pain is controlled. No changed with tremor after d/cing Gabapentin per wife. Sleep overall is fair, improved. He is cathing 1/day. He complains of left shoulder pain.   Pain Inventory Average Pain 7 Pain Right Now 7 My pain is dull, tingling and aching  In the last 24 hours, has pain interfered with the following? General activity 0 Relation with others 0 Enjoyment of life 0 What TIME of day is your pain at its worst? anytime Sleep (in general) Poor  Pain is worse with: some activites Pain improves with: unsure Relief from Meds: ?  Mobility walk without assistance ability to climb steps?  yes do you drive?  yes  Function retired  Neuro/Psych weakness spasms  Prior Studies Any changes since last visit?  no  Physicians involved in your care Any changes since last visit?  no   Family History  Problem Relation Age of Onset   Heart disease Mother    Heart disease Brother    Tremor Paternal Uncle    Heart disease Brother    Drug abuse Daughter    Hepatitis C Daughter    Social History   Socioeconomic History   Marital status: Married    Spouse name: [name]   Number of children: 2   Years of education: 10   Highest education level: 10th grade  Occupational History   Occupation: retired    Comment: Geneticist, molecular - Event organiser x 30 years  Tobacco Use   Smoking  status: Former Smoker    Packs/day: 3.00    Years: 20.00    Pack years: 60.00    Types: Cigarettes    Quit date: 04/17/1968    Years since quitting: 51.5   Smokeless tobacco: Never Used  Vaping Use   Vaping Use: Never used  Substance and Sexual Activity   Alcohol use: No   Drug use: No   Sexual activity: Not Currently  Other Topics Concern   Not on file  Social History Narrative   Patient lives in [place] with his wife and one of his daughters.   Patients other daughter lives in [place].   Patient enjoys playing all sorts of games and spending time with his family.    Social Determinants of Health   Financial Resource Strain: Low Risk    Difficulty of Paying Living Expenses: Not hard at all  Food Insecurity: No Food Insecurity   Worried About Programme researcher, broadcasting/film/video in the Last Year: Never true   Ran Out of Food in the Last Year: Never true  Transportation Needs: No Transportation Needs   Lack of Transportation (Medical): No   Lack of Transportation (Non-Medical): No  Physical Activity: Inactive   Days of Exercise per Week: 0 days   Minutes of Exercise per Session: 0 min  Stress: No Stress Concern Present   Feeling of Stress : Only a little  Social Connections: Moderately Integrated   Frequency of Communication with Friends and Family: More than three times a week   Frequency of Social Gatherings with Friends and Family: More than three times a week   Attends Religious Services: More than 4 times per year   Active Member of [employer] or Organizations: No   Attends Banker Meetings: Never   Marital Status: Married   Past Surgical History:  Procedure Laterality Date   CARDIAC CATHETERIZATION     CATARACT EXTRACTION, BILATERAL     CORONARY ARTERY BYPASS GRAFT  06/2002   LIMA-LAD, SVG-D1, SVG-OM   EYE SURGERY     KNEE SURGERY Left    LEFT HEART CATHETERIZATION WITH CORONARY/GRAFT ANGIOGRAM N/A 10/13/2011   Procedure: LEFT HEART  CATHETERIZATION WITH Isabel Caprice;  Surgeon: Marykay Lex, MD;  Location: [facility] CATH LAB;  Service: Cardiovascular;  Laterality: N/A;   MINOR PLACEMENT OF FIDUCIAL N/A 04/26/2017   Procedure: Fiducial placement;  Surgeon: Maeola Harman, MD;  Location: [facility] OR;  Service: Neurosurgery;  Laterality: N/A;  Fiducial placement   NM MYOCAR PERF WALL MOTION  03/23/2009   protocol:Bruce, normal perfusion in all regions, post-stress EF 72%, exercise capacity , EKG negative for ischemia.   PULSE GENERATOR IMPLANT Bilateral 11/13/2017   Procedure: Bilateral Implantable pulse generator;  Surgeon: Maeola Harman, MD;  Location: [facility] OR;  Service: Neurosurgery;  Laterality: Bilateral;  Bilateral Implantable pulse generator   Shoulder Orthoscopic Surgery   06/2003   SUBTHALAMIC STIMULATOR INSERTION Bilateral 05/04/2017   Procedure: Bilateral Deep brain stimulator placement;  Surgeon: Maeola Harman, MD;  Location: [facility] OR;  Service: Neurosurgery;  Laterality: Bilateral;  Bilateral deep brain stimulator placement   TRANSTHORACIC ECHOCARDIOGRAM  04/2017   Jan 2019: Normal LV size and function.  EF 55-60%.  No or W MA.  Mild LA dilation.   wrist ganglion cyst Left    Past Medical History:  Diagnosis Date   Anxiety disorder    Benign enlargement of prostate    CAD in native artery 1997   Referred for CABG x3 in 2004 for LAD diagonal bifurcation lesion   Cervical spinal stenosis    Complex regional pain syndrome of right upper extremity    Right wrist; L arm   Coronary atherosclerosis of artery bypass graft June 2013   Occluded SVG-D1; Cardiologist Dr. Herbie Baltimore   Diabetes mellitus without complication [facility])    Gait disorder    Gastroesophageal reflux disease    Glaucoma    Hiatal hernia    GI: Dr Evette Cristal   Hyperlipidemia LDL goal <70    Hypertension    Memory loss    Mild   Obesity    Peripheral neuropathy    possible peripheral neuropathy   S/P CABG x 3 2004    LIMA-LAD,  SVG to diagonal, SVG to OM   Tremor, essential    On Primidone   Unstable angina pectoris [facility])  June 2013   Cardiac cath: occluded SVG-DI. Patent LIMA-LAD and SVG-OM. EF 45% with apical inferior HK.   BP 136/75    Pulse 63    Temp 98.4 F (36.9 C)    Ht 5\' 6"  (1.676 m)    Wt 180 lb (81.6 kg)    SpO2 95%    BMI 29.05 kg/m   Opioid Risk Score:   Fall Risk Score:  `1  Depression screen PHQ 2/9  Depression screen [facility] 2/9 09/30/2019 08/13/2019 03/28/2019 05/03/2018 04/23/2018 12/28/2017 12/05/2017  Decreased Interest 0 0 0 0  0 0 0  Down, Depressed, Hopeless 0 0 0 0 0 0 0  PHQ - 2 Score 0 0 0 0 0 0 0  Some recent data might be hidden   Review of Systems  Constitutional: Negative.   HENT: Negative.   Eyes: Negative.   Respiratory: Negative.   Cardiovascular: Negative.   Gastrointestinal: Negative.   Endocrine: Negative.   Genitourinary: Negative.   Musculoskeletal: Positive for arthralgias and back pain.       Spasms   Skin: Negative.   Allergic/Immunologic: Negative.   Neurological: Positive for weakness.  Hematological: Negative.   Psychiatric/Behavioral: Negative.       Objective:   Physical Exam  Physical Exam General: NAD. Well-developed.  Resp: CTA bilaterally. Normal effort   Gait: Wide based Neuro: Alert Motor: Strength normal LUE mild low freq tremor    Assessment & Plan:  Right-handed male with history of CAD and stenting as well as CABG, memory loss with bilateral deep brain stimulator placement presents for follow up for left frontal intraparenchymal hemorrhage, DBS, debility.   1. Decreased functional mobility secondary to left frontal intraparenchymal hemorrhage with recent deep brain stimulator for tremor.              Encouraged  HEP             Continue follow up with Neurology             Tremor had essentially resolved - long process with complications, patient believes it was all worth it, wife disagrees.             Driving without issue   2. Pain:                Mainly in b/l LE             Continue Robaxin 500 , ~1/night             Cont tylenol as needed   3. Tremors:             See #1             Improved/stable   4. Sleep disturbance                Continue Melatonin              Educated on sleep hygiene             ECG personally reviewed, no longer prolonged Qtc             Continue Trazodone to 100 qhs - rarely using tramadol (~1/week)  Improving   5.  Neurogenic bladder             Cont Flomax              Cont follow up with Urology             Continue I/O caths, now, usually 1/day  6. Chronic left shoulder pain   Voltaren gel  Encourage HEP

## 2019-11-25 ENCOUNTER — Other Ambulatory Visit: Payer: Self-pay | Admitting: Cardiology

## 2019-11-25 ENCOUNTER — Other Ambulatory Visit: Payer: Self-pay | Admitting: Physical Medicine & Rehabilitation

## 2019-11-25 ENCOUNTER — Other Ambulatory Visit: Payer: Self-pay | Admitting: Family Medicine

## 2019-11-25 DIAGNOSIS — G629 Polyneuropathy, unspecified: Secondary | ICD-10-CM

## 2019-12-26 ENCOUNTER — Other Ambulatory Visit: Payer: Self-pay | Admitting: Family Medicine

## 2019-12-26 DIAGNOSIS — G629 Polyneuropathy, unspecified: Secondary | ICD-10-CM

## 2019-12-31 ENCOUNTER — Other Ambulatory Visit: Payer: Self-pay | Admitting: *Deleted

## 2020-01-01 MED ORDER — FLUTICASONE PROPIONATE 50 MCG/ACT NA SUSP
NASAL | 2 refills | Status: DC
Start: 2020-01-01 — End: 2020-11-29

## 2020-01-08 ENCOUNTER — Ambulatory Visit: Payer: PPO | Admitting: Emergency Medicine

## 2020-01-15 DIAGNOSIS — I619 Nontraumatic intracerebral hemorrhage, unspecified: Secondary | ICD-10-CM | POA: Diagnosis not present

## 2020-01-15 DIAGNOSIS — R339 Retention of urine, unspecified: Secondary | ICD-10-CM | POA: Diagnosis not present

## 2020-01-15 DIAGNOSIS — G8110 Spastic hemiplegia affecting unspecified side: Secondary | ICD-10-CM | POA: Diagnosis not present

## 2020-01-26 ENCOUNTER — Other Ambulatory Visit: Payer: Self-pay | Admitting: Physical Medicine & Rehabilitation

## 2020-01-26 ENCOUNTER — Other Ambulatory Visit: Payer: Self-pay | Admitting: Family Medicine

## 2020-01-26 DIAGNOSIS — G629 Polyneuropathy, unspecified: Secondary | ICD-10-CM

## 2020-01-29 ENCOUNTER — Encounter: Payer: PPO | Attending: Physical Medicine & Rehabilitation | Admitting: Physical Medicine & Rehabilitation

## 2020-01-29 ENCOUNTER — Ambulatory Visit: Payer: PPO | Admitting: Emergency Medicine

## 2020-01-29 DIAGNOSIS — R339 Retention of urine, unspecified: Secondary | ICD-10-CM | POA: Insufficient documentation

## 2020-01-29 DIAGNOSIS — G479 Sleep disorder, unspecified: Secondary | ICD-10-CM | POA: Insufficient documentation

## 2020-01-29 DIAGNOSIS — N319 Neuromuscular dysfunction of bladder, unspecified: Secondary | ICD-10-CM | POA: Insufficient documentation

## 2020-01-29 DIAGNOSIS — G250 Essential tremor: Secondary | ICD-10-CM | POA: Insufficient documentation

## 2020-01-29 DIAGNOSIS — I10 Essential (primary) hypertension: Secondary | ICD-10-CM | POA: Insufficient documentation

## 2020-01-29 DIAGNOSIS — I619 Nontraumatic intracerebral hemorrhage, unspecified: Secondary | ICD-10-CM | POA: Insufficient documentation

## 2020-01-29 DIAGNOSIS — F09 Unspecified mental disorder due to known physiological condition: Secondary | ICD-10-CM | POA: Insufficient documentation

## 2020-02-04 ENCOUNTER — Ambulatory Visit: Payer: PPO | Admitting: Emergency Medicine

## 2020-02-04 ENCOUNTER — Encounter: Payer: Self-pay | Admitting: Emergency Medicine

## 2020-02-04 ENCOUNTER — Other Ambulatory Visit: Payer: Self-pay

## 2020-02-04 VITALS — BP 124/64 | HR 60 | Temp 97.1°F | Ht 70.0 in | Wt 182.6 lb

## 2020-02-04 DIAGNOSIS — K219 Gastro-esophageal reflux disease without esophagitis: Secondary | ICD-10-CM | POA: Diagnosis not present

## 2020-02-04 DIAGNOSIS — J61 Pneumoconiosis due to asbestos and other mineral fibers: Secondary | ICD-10-CM | POA: Diagnosis not present

## 2020-02-04 DIAGNOSIS — Z23 Encounter for immunization: Secondary | ICD-10-CM

## 2020-02-04 DIAGNOSIS — J301 Allergic rhinitis due to pollen: Secondary | ICD-10-CM

## 2020-02-04 NOTE — Progress Notes (Signed)
Subjective:    Patient ID: Brendan [name], male    DOB: 1941/03/19, 79 y.o.   MRN: [id number]  HPI  ROV 10/11/17 --79 year old male with a history of tobacco use, asbestos exposure with pleural plaques on CT chronic cough in the setting of some GERD and allergic rhinitis, also possible intermittent aspiration.  He was recently seen here in May for a flare of his cough.  This got better with prednisone and confirmation of his maintenance therapy. He feels at baseline, cough is better.   ROV 02/04/20 --79 year old former smoker (60 pack years) with asbestos lung disease and pleural plaques. He also has chronic cough in the setting of allergic rhinitis, GERD, possible intermittent aspiration. He has been on Flonase, loratadine, uses Prilosec approximately 1-2x a week. He still has some cough, not every day, does not seem to correlate with any particular time, sometimes clear mucous. Has a lot of nasal drainage. Sometimes gets choked up on food, drink. Has albuterol which he uses occasionally, about 3-4x a week. Last CT chest was 06/28/2018, showed stable bilateral calcified plaques and left pleural thickening, some mild subpleural reticulation. His last pulmonary function testing was in 2014                                                                                                                                                                                                 Review of Systems As per history of present illness      Objective:   Physical Exam Vitals:   02/04/20 1143  BP: 124/64  Pulse: 60  Temp: (!) 97.1 F (36.2 C)  TempSrc: Temporal  SpO2: 94%  Weight: 182 lb 9.6 oz (82.8 kg)  Height: 5\' 10"  (1.778 m)   Gen: Pleasant, well-nourished, in no distress,  normal affect  ENT: No lesions,  mouth clear,  oropharynx clear, no postnasal drip  Neck: No JVD, no stridor  Lungs: No use of accessory muscles, distant, especially L upper  Cardiovascular: RRR, heart sounds  normal, no murmur or gallops, no peripheral edema  Musculoskeletal: No deformities, no cyanosis or clubbing  Neuro: alert, non focal  Skin: Warm, no lesions or rashes     Assessment & Plan:  Asbestosis (HCC) We will repeat your CT chest without contrast to follow your pleural plaques and asbestosis We will repeat your pulmonary function testing at your next office visit Follow with Dr. next available with full pulmonary function testing on the same day.   GERD Consider taking your Prilosec every day for about 2 weeks to see if treating your reflux consistently decreases  your cough.  Take the medication about 1 hour around food  Allergic rhinitis Continue your fluticasone nasal spray and your allergy pill as you have been taking them.  Levy Pupa, MD, PhD 02/04/2020, 12:10 PM [facility] [phone] or if no answer [phone]

## 2020-02-04 NOTE — Patient Instructions (Addendum)
We will repeat your CT chest without contrast to follow your pleural plaques and asbestosis We will repeat your pulmonary function testing at your next office visit Keep albuterol available to use 2 puffs when needed for shortness of breath Continue your fluticasone nasal spray and your allergy pill as you have been taking them. Consider taking your Prilosec every day for about 2 weeks to see if treating your reflux consistently decreases your cough.  Take the medication about 1 hour around food Follow with Dr. Delton Coombes next available with full pulmonary function testing on the same day.

## 2020-02-04 NOTE — Addendum Note (Signed)
Addended by: Dorisann Frames R on: 02/04/2020 12:17 PM   Modules accepted: Orders

## 2020-02-04 NOTE — Assessment & Plan Note (Signed)
Consider taking your Prilosec every day for about 2 weeks to see if treating your reflux consistently decreases your cough.  Take the medication about 1 hour around food

## 2020-02-04 NOTE — Assessment & Plan Note (Signed)
We will repeat your CT chest without contrast to follow your pleural plaques and asbestosis We will repeat your pulmonary function testing at your next office visit Follow with Dr. Delton Coombes next available with full pulmonary function testing on the same day.

## 2020-02-04 NOTE — Assessment & Plan Note (Signed)
Continue your fluticasone nasal spray and your allergy pill as you have been taking them.

## 2020-02-11 ENCOUNTER — Telehealth: Payer: Self-pay | Admitting: Neurology

## 2020-02-11 NOTE — Telephone Encounter (Signed)
Patient's wife called in wanting to find out if she needs to turn off his DBS for his upcoming CT scan?

## 2020-02-12 NOTE — Telephone Encounter (Signed)
Spoke with patients wife and gave her Dr Don Perking recommendations. Patients wife voiced understanding.   Patients wife wanted to make Dr Tat aware that the patient is having CT on his lungs.

## 2020-02-12 NOTE — Telephone Encounter (Signed)
no

## 2020-02-16 ENCOUNTER — Telehealth: Payer: Self-pay | Admitting: Neurology

## 2020-02-16 NOTE — Telephone Encounter (Signed)
Spoke with patients wife who was calling to see if the patient needed to turn off his DBS prior to having his CT. Informed her that I spoke with Dr Tat about this matter last week and Dr Tat recommended the patient not turn off his DBS. She states she understands but her Daughter wanted her to contact the office to be sure since the patient had to turn off his DBS for a EKG.   She states speaking with me is reassuring and she will see me in the office at her husbands next appointment. She thanked me for calling.

## 2020-02-17 ENCOUNTER — Ambulatory Visit (INDEPENDENT_AMBULATORY_CARE_PROVIDER_SITE_OTHER)
Admission: RE | Admit: 2020-02-17 | Discharge: 2020-02-17 | Disposition: A | Discharge status: Home / Self Care | Payer: PPO | Source: Ambulatory Visit | Attending: Emergency Medicine | Admitting: Emergency Medicine

## 2020-02-17 ENCOUNTER — Other Ambulatory Visit: Payer: Self-pay

## 2020-02-17 DIAGNOSIS — J61 Pneumoconiosis due to asbestos and other mineral fibers: Secondary | ICD-10-CM | POA: Diagnosis not present

## 2020-02-17 DIAGNOSIS — J432 Centrilobular emphysema: Secondary | ICD-10-CM | POA: Diagnosis not present

## 2020-02-17 DIAGNOSIS — J90 Pleural effusion, not elsewhere classified: Secondary | ICD-10-CM | POA: Diagnosis not present

## 2020-02-17 DIAGNOSIS — J929 Pleural plaque without asbestos: Secondary | ICD-10-CM | POA: Diagnosis not present

## 2020-02-17 DIAGNOSIS — J984 Other disorders of lung: Secondary | ICD-10-CM | POA: Diagnosis not present

## 2020-02-26 ENCOUNTER — Other Ambulatory Visit: Payer: Self-pay | Admitting: *Deleted

## 2020-02-27 MED ORDER — ALBUTEROL SULFATE HFA 108 (90 BASE) MCG/ACT IN AERS
INHALATION_SPRAY | RESPIRATORY_TRACT | 2 refills | Status: DC
Start: 2020-02-27 — End: 2021-04-21

## 2020-03-10 ENCOUNTER — Other Ambulatory Visit: Payer: Self-pay | Admitting: Physical Medicine & Rehabilitation

## 2020-03-16 ENCOUNTER — Other Ambulatory Visit: Payer: Self-pay | Admitting: Family Medicine

## 2020-03-29 ENCOUNTER — Other Ambulatory Visit: Payer: Self-pay | Admitting: Family Medicine

## 2020-03-29 ENCOUNTER — Other Ambulatory Visit: Payer: Self-pay | Admitting: Physical Medicine & Rehabilitation

## 2020-03-29 DIAGNOSIS — G629 Polyneuropathy, unspecified: Secondary | ICD-10-CM

## 2020-04-01 ENCOUNTER — Other Ambulatory Visit: Payer: Self-pay

## 2020-04-01 ENCOUNTER — Encounter: Payer: Self-pay | Admitting: Emergency Medicine

## 2020-04-01 ENCOUNTER — Ambulatory Visit (INDEPENDENT_AMBULATORY_CARE_PROVIDER_SITE_OTHER): Payer: PPO | Admitting: Emergency Medicine

## 2020-04-01 ENCOUNTER — Ambulatory Visit: Payer: PPO | Admitting: Emergency Medicine

## 2020-04-01 DIAGNOSIS — J61 Pneumoconiosis due to asbestos and other mineral fibers: Secondary | ICD-10-CM

## 2020-04-01 DIAGNOSIS — J449 Chronic obstructive pulmonary disease, unspecified: Secondary | ICD-10-CM

## 2020-04-01 LAB — PULMONARY FUNCTION TEST
DL/VA % pred: 137 %
DL/VA: 5.84 ml/min/mmHg/L
DLCO cor % pred: 83 %
DLCO cor: 21.25 ml/min/mmHg
DLCO unc % pred: 83 %
DLCO unc: 21.25 ml/min/mmHg
FEF 25-75 Post: 1.93 L/sec
FEF 25-75 Pre: 1.23 L/sec
FEF2575-%Change-Post: 55 %
FEF2575-%Pred-Post: 101 %
FEF2575-%Pred-Pre: 65 %
FEV1-%Change-Post: 12 %
FEV1-%Pred-Post: 75 %
FEV1-%Pred-Pre: 67 %
FEV1-Post: 1.9 L
FEV1-Pre: 1.7 L
FEV1FVC-%Change-Post: 2 %
FEV1FVC-%Pred-Pre: 95 %
FEV6-%Change-Post: 9 %
FEV6-%Pred-Post: 82 %
FEV6-%Pred-Pre: 75 %
FEV6-Post: 2.56 L
FEV6-Pre: 2.34 L
FEV6FVC-%Pred-Post: 107 %
FEV6FVC-%Pred-Pre: 107 %
FVC-%Change-Post: 9 %
FVC-%Pred-Post: 76 %
FVC-%Pred-Pre: 70 %
FVC-Post: 2.56 L
FVC-Pre: 2.34 L
Post FEV1/FVC ratio: 74 %
Post FEV6/FVC ratio: 100 %
Pre FEV1/FVC ratio: 73 %
Pre FEV6/FVC Ratio: 100 %
RV % pred: 101 %
RV: 2.35 L
TLC % pred: 76 %
TLC: 4.61 L

## 2020-04-01 MED ORDER — PROPRANOLOL HCL 40 MG PO TABS
40.0000 mg | ORAL_TABLET | Freq: Two times a day (BID) | ORAL | 3 refills | Status: DC
Start: 2020-04-01 — End: 2020-08-05

## 2020-04-01 NOTE — Assessment & Plan Note (Signed)
Clinically, by CT chest and by PFT.  Plan to continue to follow.  Next CT chest in 1 year, follow-up in 1 year or sooner if needed

## 2020-04-01 NOTE — Assessment & Plan Note (Signed)
Stable obstruction noted on his pulmonary function testing.  He has albuterol, uses only rarely.  He has a decreased functional capacity and I talked to him today about starting long-acting bronchodilator.  He wants to hold off.  We will consider going forward if his symptoms become limiting

## 2020-04-01 NOTE — Patient Instructions (Signed)
Your CT scan is stable compared with priors.  This is good news Your pulmonary function testing shows stable COPD, unchanged compared with 2014.  This is good news. We will plan to repeat your Ct chest in November 2022.  Keep your albuterol available use 2 puffs if you need it for shortness of breath. We will hold off on starting an every day inhaler medication for now, but we could consider this if your breathing changes or worsens.  Please call us and let us know if your symptoms increase. Follow with Dr. Delton Coombes in 1 year.  We will review your CT scan of the chest at that time.

## 2020-04-01 NOTE — Progress Notes (Signed)
Full PFT performed today. °

## 2020-04-01 NOTE — Progress Notes (Signed)
Subjective:    Patient ID: Brendan [name], male    DOB: 1941-03-29, 79 y.o.   MRN: [id number]  HPI  ROV 10/11/17 --79 year old male with a history of tobacco use, asbestos exposure with pleural plaques on CT chronic cough in the setting of some GERD and allergic rhinitis, also possible intermittent aspiration.  He was recently seen here in May for a flare of his cough.  This got better with prednisone and confirmation of his maintenance therapy. He feels at baseline, cough is better.   ROV 02/04/20 --79 year old former smoker (60 pack years) with asbestos lung disease and pleural plaques. He also has chronic cough in the setting of allergic rhinitis, GERD, possible intermittent aspiration. He has been on Flonase, loratadine, uses Prilosec approximately 1-2x a week. He still has some cough, not every day, does not seem to correlate with any particular time, sometimes clear mucous. Has a lot of nasal drainage. Sometimes gets choked up on food, drink. Has albuterol which he uses occasionally, about 3-4x a week. Last CT chest was 06/28/2018, showed stable bilateral calcified plaques and left pleural thickening, some mild subpleural reticulation. His last pulmonary function testing was in 2014  ROV 04/01/20 --79 year old man with a history of tobacco use, asbestos lung disease with pleural plaques, chronic cough with allergic rhinitis and GERD, question intermittent aspiration.       We repeated the CT scan of the chest on 02/17/2020 and I have reviewed, shows stable calcified pleural plaques, chronic trace left pleural effusion and thickening without any significant interstitial disease or scar. Pulmonary function testing done today reviewed by me, show moderately severe obstruction with a positive bronchodilator response, coexisting restriction.  He had a difficult time doing lung volumes but the suggest restriction as well.  Diffusion capacity normal.  His FEV1 is 1.70 L stable compared with prior PFT  05/15/2012. He reports that he has exertional SOB, sometimes also when cold. He has albuterol, uses a few times a week.                                                                                                                                                                                        Review of Systems As per history of present illness     Objective:   Physical Exam Vitals:   04/01/20 1219  BP: 126/70  Pulse: (!) 56  Temp: (!) 97.2 F (36.2 C)  SpO2: 100%  Weight: 187 lb (84.8 kg)  Height: 5\' 5"  (1.651 m)   Gen: Pleasant, well-nourished, in no distress,  normal affect  ENT: No lesions,  mouth clear,  oropharynx clear, no postnasal drip  Neck: No JVD, no stridor  Lungs: No use of accessory muscles, distant, some basilar crackles   Cardiovascular: RRR, heart sounds normal, no murmur or gallops, no peripheral edema  Musculoskeletal: No deformities, no cyanosis or clubbing  Neuro: alert, non focal except poor hearing  Skin: Warm, no lesions or rashes     Assessment & Plan:  Asbestosis (HCC) Clinically, by CT chest and by PFT.  Plan to continue to follow.  Next CT chest in 1 year, follow-up in 1 year or sooner if needed  COPD (chronic obstructive pulmonary disease) (HCC) Stable obstruction noted on his pulmonary function testing.  He has albuterol, uses only rarely.  He has a decreased functional capacity and I talked to him today about starting long-acting bronchodilator.  He wants to hold off.  We will consider going forward if his symptoms become limiting  Levy Pupa, MD, PhD 04/01/2020, 1:26 PM [facility] [phone] or if no answer [phone]

## 2020-04-01 NOTE — Addendum Note (Signed)
Addended by: Dorisann Frames R on: 04/01/2020 01:58 PM   Modules accepted: Orders

## 2020-04-13 ENCOUNTER — Other Ambulatory Visit: Payer: Self-pay | Admitting: Family Medicine

## 2020-04-13 DIAGNOSIS — G629 Polyneuropathy, unspecified: Secondary | ICD-10-CM

## 2020-04-21 DIAGNOSIS — N401 Enlarged prostate with lower urinary tract symptoms: Secondary | ICD-10-CM | POA: Diagnosis not present

## 2020-04-21 DIAGNOSIS — R3912 Poor urinary stream: Secondary | ICD-10-CM | POA: Diagnosis not present

## 2020-04-21 DIAGNOSIS — R338 Other retention of urine: Secondary | ICD-10-CM | POA: Diagnosis not present

## 2020-05-27 ENCOUNTER — Other Ambulatory Visit: Payer: Self-pay | Admitting: Physical Medicine & Rehabilitation

## 2020-05-27 ENCOUNTER — Other Ambulatory Visit: Payer: Self-pay | Admitting: Family Medicine

## 2020-05-27 DIAGNOSIS — G629 Polyneuropathy, unspecified: Secondary | ICD-10-CM

## 2020-06-01 ENCOUNTER — Ambulatory Visit: Payer: PPO | Admitting: Cardiology

## 2020-06-05 ENCOUNTER — Other Ambulatory Visit: Payer: Self-pay | Admitting: Family Medicine

## 2020-06-05 DIAGNOSIS — J302 Other seasonal allergic rhinitis: Secondary | ICD-10-CM

## 2020-06-08 NOTE — Progress Notes (Signed)
Assessment/Plan:    1.   Essential Tremor             Patient is status post bilateral DBS to the bilateral VIM on May 04, 2017.  Patient had postoperative bleed on the left, with resultant hematoma causing paralysis on the right, which has since resolved.  Because of complications, the patient's IPG was not placed until July, 2019.    2.  History of myoclonus.             -This was related to gabapentin.  Resolved when gabapentin was discontinued.  3.  Memory loss  -Wife taking full-time care of the patient now.  Suspect that patient may have developed dementia with time.  Encouraged patient to stay active, both mentally and physically.  Subjective:   Brendan [name] was seen today in follow up for tremor, status post DBS surgery.  Patient was last seen in April, 2021.  Notes are reviewed.  Patient is present today with his wife who supplements the history.  His daughter is actually present as well (but they came in separately).  Tremor is well controlled.  No complaints from that standpoint.  Wife does state that patient does not do much all day long.   ALLERGIES:   Allergies  Allergen Reactions  . Lisinopril Cough    CURRENT MEDICATIONS:  Outpatient Encounter Medications as of 06/09/2020  Medication Sig  . albuterol (PROAIR HFA) 108 (90 Base) MCG/ACT inhaler INHALE 2 PUFFS INTO THE LUNGS 4 TIMES DAILY AS NEEDED  . ALLERGY RELIEF 10 MG tablet TAKE 1 TABLET BY MOUTH DAILY  . aspirin EC 81 MG tablet Take 81 mg by mouth daily.  [name] atorvastatin (LIPITOR) 40 MG tablet TAKE 1 TABLET BY MOUTH DAILY  . fluticasone (FLONASE) 50 MCG/ACT nasal spray USE 2 SPRAYS IN BOTH NOSTRILS DAILY  . furosemide (LASIX) 20 MG tablet Take 1 tablet twice a week (Sunday, Thursday)  . Melatonin 3 MG TABS Take 1 tablet (3 mg total) by mouth at bedtime.  . methocarbamol (ROBAXIN) 750 MG tablet TAKE 1 TABLET BY MOUTH TWICE DAILY AS NEEDED FOR MUSCLE SPASMS  . nitroGLYCERIN (NITROSTAT) 0.4 MG SL tablet  Place 1 tablet (0.4 mg total) under the tongue every 5 (five) minutes as needed for chest pain. call 911 if chest pain not better  . omeprazole (PRILOSEC) 20 MG capsule TAKE 1 CAPSULE BY MOUTH DAILY AS NEEDED FOR ACID REFLUX  . propranolol (INDERAL) 40 MG tablet Take 1 tablet (40 mg total) by mouth 2 (two) times daily.  . traMADol (ULTRAM) 50 MG tablet TAKE 1 TABLET BY MOUTH EVERY 6 HOURS AS NEEDED FOR MODERATE PAIN  . traZODone (DESYREL) 100 MG tablet TAKE 1 TABLET BY MOUTH ONCE DAILY AT BEDTIME  . [DISCONTINUED] ALLERGY RELIEF 10 MG tablet TAKE 1 TABLET BY MOUTH DAILY   Facility-Administered Encounter Medications as of 06/09/2020  Medication  . 0.9 %  sodium chloride infusion  . sodium chloride 0.9 % injection 3 mL     Objective:    PHYSICAL EXAMINATION:    VITALS:   Vitals:   06/09/20 1118  BP: 140/66  Pulse: 71  SpO2: 97%  Weight: 190 lb (86.2 kg)  Height: 5\' 6"  (1.676 m)    GEN:  The patient appears stated age and is in NAD. HEENT:  Normocephalic, atraumatic.  The mucous membranes are moist. The superficial temporal arteries are without ropiness or tenderness. CV:  RRR Lungs:  CTAB Neck/HEME:  There  are no carotid bruits bilaterally.  Neurological examination:  Orientation: The patient is alert and oriented to person and place.  He does look to his wife for finer aspects of the history. Cranial nerves: There is good facial symmetry. The speech is fluent and clear. Soft palate rises symmetrically and there is no tongue deviation. Hearing is intact to conversational tone. Sensation: Sensation is intact to light touch throughout Motor: Strength is at least antigravity x4.  Movement examination: Tone: There is normal tone in the UE/LE (does have trouble relaxing) Abnormal movements: No tremor noted while the device is active. Coordination:  There is no decremation with RAM's Gait and Station: The patient is somewhat unsteady when he first gets up.  He is just mildly  unsteady when ambulating.    I have reviewed and interpreted the following labs independently   Chemistry      Component Value Date/Time   NA 143 02/04/2019 1534   K 3.9 02/04/2019 1534   CL 104 02/04/2019 1534   CO2 25 02/04/2019 1534   BUN 9 02/04/2019 1534   CREATININE 1.09 02/04/2019 1534   CREATININE 1.02 05/01/2016 1118      Component Value Date/Time   CALCIUM 9.2 02/04/2019 1534   ALKPHOS 159 (H) 05/06/2019 1304   AST 20 05/06/2019 1304   ALT 20 05/06/2019 1304   BILITOT 1.1 05/06/2019 1304      Total time spent on today's visit was 20 minutes, including both face-to-face time and nonface-to-face time.  Time included that spent on review of records (prior notes available to me/labs/imaging if pertinent), discussing treatment and goals, answering patient's questions and coordinating care.  This didn't include dbs time  Cc:  Autry-Lott, Randa Evens, DO

## 2020-06-09 ENCOUNTER — Encounter: Payer: Self-pay | Admitting: Neurology

## 2020-06-09 ENCOUNTER — Other Ambulatory Visit: Payer: Self-pay

## 2020-06-09 ENCOUNTER — Ambulatory Visit: Payer: PPO | Admitting: Neurology

## 2020-06-09 VITALS — BP 140/66 | HR 71 | Ht 66.0 in | Wt 190.0 lb

## 2020-06-09 DIAGNOSIS — R413 Other amnesia: Secondary | ICD-10-CM | POA: Diagnosis not present

## 2020-06-09 DIAGNOSIS — G250 Essential tremor: Secondary | ICD-10-CM

## 2020-06-09 NOTE — Procedures (Signed)
DBS Programming was performed.    Manufacturer of DBS device: [employer]onic  Total time spent programming was 10 minutes.  Device was confirmed to be on.  Soft start was confirmed to be on.  Impedences were checked and were within normal limits.  Battery was checked and was determined to be functioning normally and not near the end of life.  Final settings were as follows:   Active Contacts Amplitude (V) PW (ms) Frequency (hz) Battery  Left Brain       08/07/19 1-0+ 3.3 90 160 2.97  06/09/20 1-0+ 3.3 90 160 2.93                Right Brain       08/07/19 1-0+ 2.7 90 150 2.98  06/09/20 1-0+ 2.7 90 150 2.95

## 2020-06-10 ENCOUNTER — Other Ambulatory Visit: Payer: Self-pay

## 2020-06-10 ENCOUNTER — Telehealth: Payer: Self-pay | Admitting: *Deleted

## 2020-06-10 NOTE — Telephone Encounter (Signed)
I let Brendan [name] know and she will try the primary care.

## 2020-06-10 NOTE — Telephone Encounter (Signed)
[name] called because she found out that Mr Brendan [name] trazodone had been denied and she is asking why and what can be done to get it refilled because he is out. Please advise.

## 2020-06-10 NOTE — Telephone Encounter (Signed)
I am not sure, but I saw him last over 6 months ago with plans to follow-up after 3 months.  Luke appointment was canceled and there is no follow-up appointment.  I cannot continue to prescribe medications with seen the patient, as such, I would recommend scheduling a follow-up appointment with me or, alternatively, may ask PCP to prescribe medication. Thanks.

## 2020-06-10 NOTE — Telephone Encounter (Signed)
Patient's wife calls nurse line regarding trazodone refill. Reports that this was previously being prescribed by Dr. Allena Katz, however, they are no longer seeing this provider.   Please advise if refill can be sent in.   Veronda Prude, RN

## 2020-06-11 ENCOUNTER — Encounter: Payer: Self-pay | Admitting: Cardiology

## 2020-06-11 ENCOUNTER — Other Ambulatory Visit: Payer: Self-pay

## 2020-06-11 ENCOUNTER — Ambulatory Visit: Payer: PPO | Admitting: Cardiology

## 2020-06-11 VITALS — BP 110/60 | HR 63 | Ht 66.0 in | Wt 188.0 lb

## 2020-06-11 DIAGNOSIS — Z951 Presence of aortocoronary bypass graft: Secondary | ICD-10-CM | POA: Diagnosis not present

## 2020-06-11 DIAGNOSIS — I25709 Atherosclerosis of coronary artery bypass graft(s), unspecified, with unspecified angina pectoris: Secondary | ICD-10-CM | POA: Diagnosis not present

## 2020-06-11 DIAGNOSIS — I25119 Atherosclerotic heart disease of native coronary artery with unspecified angina pectoris: Secondary | ICD-10-CM | POA: Diagnosis not present

## 2020-06-11 DIAGNOSIS — I10 Essential (primary) hypertension: Secondary | ICD-10-CM

## 2020-06-11 DIAGNOSIS — E785 Hyperlipidemia, unspecified: Secondary | ICD-10-CM | POA: Diagnosis not present

## 2020-06-11 MED ORDER — TRAZODONE HCL 100 MG PO TABS
100.0000 mg | ORAL_TABLET | Freq: Every day | ORAL | 0 refills | Status: DC
Start: 2020-06-11 — End: 2020-07-12

## 2020-06-11 NOTE — Telephone Encounter (Signed)
Will prescribe 30 days of trazodone. Recommended pt comes in for evaluation if he would like more refills after this.

## 2020-06-11 NOTE — Patient Instructions (Signed)
Medication Instructions:  No changes *If you need a refill on your cardiac medications before your next appointment, please call your pharmacy*   Lab Work: Not needed  Please have primary to send a copy of lipids results    Testing/Procedures: Not needed   Follow-Up: At [facility], you and your health needs are our priority.  As part of our continuing mission to provide you with exceptional heart care, we have created designated Provider Care Teams.  These Care Teams include your primary Cardiologist (physician) and Advanced Practice Providers (APPs -  Physician Assistants and Nurse Practitioners) who all work together to provide you with the care you need, when you need it.  We recommend signing up for the patient portal called "MyChart".  Sign up information is provided on this After Visit Summary.  MyChart is used to connect with patients for Virtual Visits (Telemedicine).  Patients are able to view lab/test results, encounter notes, upcoming appointments, etc.  Non-urgent messages can be sent to your provider as well.   To learn more about what you can do with MyChart, go to [email or web address].    Your next appointment:   12 month(s)  The format for your next appointment:   In Person  Provider:   Bryan Lemma, MD

## 2020-06-11 NOTE — Progress Notes (Signed)
Primary Care Provider: Lavonda Jumbo, DO Cardiologist: Bryan Lemma, MD Electrophysiologist: None  Clinic Note: Chief Complaint  Patient presents with  . Follow-up    Doing okay  . Coronary Artery Disease    No angina.  Able to walk to the mailbox   ===================================  ASSESSMENT/PLAN   Problem List Items Addressed This Visit    Coronary artery disease involving coronary bypass graft of native heart with angina pectoris (HCC) - Primary (Chronic)    2-3 patent grafts.  Diagonal graft to a very small caliber diagonal is occluded.  No active angina symptoms.  He was more mobile before, but has gained back some weight.  I encouraged him to continue to stay active.  Plan: Continue beta-blocker (he is using Inderal because of tremor), along with aspirin and statin.  We took him off the ARB because of low blood pressures.  Blood pressure looks great now.       Relevant Orders   EKG 12-Lead (Completed)   S/P CABG x 3 (Chronic)    We talked about whether or not we would recheck a surveillance Myoview.  At this point, if he is not having any symptoms, he would prefer to avoid surveillance testing.      Atherosclerosis of native coronary artery of native heart with angina pectoris (HCC) (Chronic)    May be mild stable angina, well controlled.  He is on losartan and propranolol.  Not requiring nitroglycerin or Lasix.  Well-controlled. Is on statin with labs being checked by PCP-  Previous good control.  On maintenance aspirin 81 mg      Essential hypertension (Chronic)    Blood pressure looks better off of ACE inhibitor.  With him having some dizziness leery of orthostatic hypotension.  As such, this current blood pressure is about as good as it should get.  Would not try to titrate up medications unless he has consistently elevated pressures.      Hyperlipidemia LDL goal <70 (Chronic)    Labs look good in January 2021.  Due for labs recheck soon.         ===================================  HPI:    Brendan [name] is a 80 y.o. male with a PMH notable for CAD-CABG x3 (1997), Parkinson's Disease, HLD, HTN and GERD who presents today for delayed annual follow-up.   CAD -CABGx3 in 1997.   Unstable Angina in June 2013 - Occluded SVG-Diag w/Patent LIMA-LAD &SVG-OM treated medically. He does have chronic stable angina.   He is on Inderal as BB to help Rx tremors along with CAD.   Last Myoview 09/2015: EF 55-65%. No ischemia or infarction noted. LOW RISK  S/p 3 step Deep Brain Stimulator for Essential Tremor.  Brendan [name] was last seen on 04/03/2019-> he had definitely picked up his level of activity.  Still limited but not doing much more.  Tremor was notably improved with the deep brain and stimulator working.  He had lost about 22 pounds.  Was off the ARB, feeling much better.  We were allowing his blood pressures to be mildly elevated-target less than 150/90.  Recent Hospitalizations: None  Reviewed  CV studies:    The following studies were reviewed today: (if available, images/films reviewed: From Epic Chart or Care Everywhere) . None:   Interval History:   Brendan [name] returns here today for delayed annual follow-up doing fairly well.  He indicates that he is able to walk to the mailbox and back without having to stop because  of dyspnea.  Sometimes is because his balance is off.  He denies any chest pain or pressure with this amount of exertion which is about as much she is easily do.  Otherwise he does not do a whole lot.  He has not had any resting symptoms of chest pain/pressure or dyspnea.  No PND, orthopnea with only trivial edema.  He rarely takes his furosemide.  He does have fatigue and will get short of breath if he pushes it beyond that distance to the mailbox and back, but this is more than he has been able to do.  As result of his last frequent and less intense exercise, he has been on weight-about 10  pounds.  CV Review of Symptoms (Summary): positive for - Stable exercise intolerance/fatigue with some exertional dyspnea, but notably improved. negative for - chest pain, irregular heartbeat, orthopnea, palpitations, paroxysmal nocturnal dyspnea, rapid heart rate, shortness of breath or Minimal edema; lightheadedness or dizziness, syncope/near syncope or TIA/amaurosis fugax.  Not having any claudication either.   The patient does not have symptoms concerning for COVID-19 infection (fever, chills, cough, or new shortness of breath).   REVIEWED OF SYSTEMS   Review of Systems  Constitutional: Positive for malaise/fatigue (Does not have a lot of energy to do much.). Negative for weight loss (Gained back some of the weight that he previously lost.).  HENT: Positive for congestion. Negative for nosebleeds.   Respiratory: Negative for shortness of breath (Only on occasion.  If he overdoes it.).   Gastrointestinal: Negative for blood in stool and melena.  Genitourinary: Negative for hematuria.  Musculoskeletal: Positive for joint pain.  Neurological: Positive for dizziness (Takes a long time for to stand up his orthostatic dizziness.), tremors (Significantly suppressed by a DBS) and weakness (Notably improved; still has some global weakness and unsteady gait.).  Endo/Heme/Allergies: Positive for environmental allergies.  Psychiatric/Behavioral: Positive for memory loss. Negative for depression. The patient is not nervous/anxious and does not have insomnia.    I have reviewed and (if needed) personally updated the patient's problem list, medications, allergies, past medical and surgical history, social and family history.   PAST MEDICAL HISTORY   Past Medical History:  Diagnosis Date  . Anxiety disorder   . Benign enlargement of prostate   . CAD in native artery 1997   Referred for CABG x3 in 2004 for LAD diagonal bifurcation lesion  . Cervical spinal stenosis   . Complex regional pain  syndrome of right upper extremity    Right wrist; L arm  . Coronary atherosclerosis of artery bypass graft June 2013   Occluded SVG-D1; Cardiologist Dr. Herbie Baltimore  . Diabetes mellitus without complication (HCC)   . Gait disorder   . Gastroesophageal reflux disease   . Glaucoma   . Hiatal hernia    GI: Dr Evette Cristal  . Hyperlipidemia LDL goal <70   . Hypertension   . Memory loss    Mild  . Obesity   . Peripheral neuropathy    possible peripheral neuropathy  . S/P CABG x 3 2004    LIMA-LAD, SVG to diagonal, SVG to OM  . Tremor, essential    On Primidone  . Unstable angina pectoris [facility])  June 2013   Cardiac cath: occluded SVG-DI. Patent LIMA-LAD and SVG-OM. EF 45% with apical inferior HK.    PAST SURGICAL HISTORY   Past Surgical History:  Procedure Laterality Date  . CATARACT EXTRACTION, BILATERAL    . CORONARY ARTERY BYPASS GRAFT  06/2002   LIMA-LAD,  SVG-D1, SVG-OM  . EYE SURGERY    . KNEE SURGERY Left   . LEFT HEART CATH AND CORONARY ANGIOGRAPHY  1997; 2004   In 2004 was referred for CABG due to a LAD-bifurcation lesion  . LEFT HEART CATHETERIZATION WITH CORONARY/GRAFT ANGIOGRAM N/A 10/13/2011   Procedure: LEFT HEART CATHETERIZATION WITH Isabel Caprice;  Surgeon: Marykay Lex, MD;  Location: [facility] CATH LAB;;  occluded SVG-DI. Patent LIMA-LAD and SVG-OM. EF 45% with apical inferior HK.;  . MINOR PLACEMENT OF FIDUCIAL N/A 04/26/2017   Procedure: Fiducial placement;  Surgeon: Maeola Harman, MD;  Location: [facility] OR;  Service: Neurosurgery;  Laterality: N/A;  Fiducial placement  . NM MYOCAR PERF WALL MOTION  03/23/2009   protocol:Bruce, normal perfusion in all regions, post-stress EF 72%, exercise capacity , EKG negative for ischemia.  [name] PULSE GENERATOR IMPLANT Bilateral 11/13/2017   Procedure: Bilateral Implantable pulse generator;  Surgeon: Maeola Harman, MD;  Location: [facility] OR;  Service: Neurosurgery;  Laterality: Bilateral;  Bilateral Implantable pulse generator  . Shoulder  Orthoscopic Surgery   06/2003  . SUBTHALAMIC STIMULATOR INSERTION Bilateral 05/04/2017   Procedure: Bilateral Deep brain stimulator placement;  Surgeon: Maeola Harman, MD;  Location: [facility] OR;  Service: Neurosurgery;  Laterality: Bilateral;  Bilateral deep brain stimulator placement  . TRANSTHORACIC ECHOCARDIOGRAM  04/2017   Jan 2019: Normal LV size and function.  EF 55-60%.  No or W MA.  Mild LA dilation.  . wrist ganglion cyst Left      Immunization History  Administered Date(s) Administered  . Fluad Quad(high Dose 65+) 01/03/2019, 02/04/2020  . Influenza Split 01/11/2011, 01/02/2012  . Influenza Whole 02/07/2008, 01/29/2009, 01/18/2010  . Influenza,inj,Quad PF,6+ Mos 01/31/2013, 01/12/2014, 01/06/2015, 01/05/2016, 01/10/2017, 12/31/2017  . PFIZER(Purple Top)SARS-COV-2 Vaccination 07/12/2019, 08/05/2019, 03/30/2020  . Pneumococcal Conjugate-13 11/30/2015  . Pneumococcal Polysaccharide-23 11/15/1996, 02/27/2011, 05/08/2017  . Td 05/18/2002  . Tdap 04/23/2018  . Zoster 08/10/2011    MEDICATIONS/ALLERGIES   Current Meds  Medication Sig  . albuterol (PROAIR HFA) 108 (90 Base) MCG/ACT inhaler INHALE 2 PUFFS INTO THE LUNGS 4 TIMES DAILY AS NEEDED  . ALLERGY RELIEF 10 MG tablet TAKE 1 TABLET BY MOUTH DAILY  . aspirin EC 81 MG tablet Take 81 mg by mouth daily.  [name] atorvastatin (LIPITOR) 40 MG tablet TAKE 1 TABLET BY MOUTH DAILY  . fluticasone (FLONASE) 50 MCG/ACT nasal spray USE 2 SPRAYS IN BOTH NOSTRILS DAILY  . furosemide (LASIX) 20 MG tablet Take 1 tablet twice a week (Sunday, Thursday) (Patient taking differently: Take 20 mg by mouth. Take 1 tablet twice a week (Sunday, Thursday). Pt takes PRN)  . Melatonin 3 MG TABS Take 1 tablet (3 mg total) by mouth at bedtime.  . methocarbamol (ROBAXIN) 750 MG tablet TAKE 1 TABLET BY MOUTH TWICE DAILY AS NEEDED FOR MUSCLE SPASMS  . nitroGLYCERIN (NITROSTAT) 0.4 MG SL tablet Place 1 tablet (0.4 mg total) under the tongue every 5 (five) minutes as needed  for chest pain. call 911 if chest pain not better  . omeprazole (PRILOSEC) 20 MG capsule TAKE 1 CAPSULE BY MOUTH DAILY AS NEEDED FOR ACID REFLUX  . propranolol (INDERAL) 40 MG tablet Take 1 tablet (40 mg total) by mouth 2 (two) times daily.  . traMADol (ULTRAM) 50 MG tablet TAKE 1 TABLET BY MOUTH EVERY 6 HOURS AS NEEDED FOR MODERATE PAIN  . traZODone (DESYREL) 100 MG tablet Take 1 tablet (100 mg total) by mouth at bedtime.    Allergies  Allergen Reactions  . Lisinopril Cough  SOCIAL HISTORY/FAMILY HISTORY   Reviewed in Epic:  Pertinent findings:  Social History   Tobacco Use  . Smoking status: Former Smoker    Packs/day: 3.00    Years: 20.00    Pack years: 60.00    Types: Cigarettes    Quit date: 04/17/1968    Years since quitting: 52.2  . Smokeless tobacco: Never Used  Vaping Use  . Vaping Use: Never used  Substance Use Topics  . Alcohol use: No  . Drug use: No   Social History   Social History Narrative   Patient lives in [place] with his wife and one of his daughters.   Patients other daughter lives in [place].   Patient enjoys playing all sorts of games and spending time with his family.     OBJCTIVE -PE, EKG, labs   Wt Readings from Last 3 Encounters:  06/22/20 188 lb 9.6 oz (85.5 kg)  06/11/20 188 lb (85.3 kg)  06/09/20 190 lb (86.2 kg)  04/03/2019 177 pounds  Physical Exam: BP 110/60 (BP Location: Left Arm, Patient Position: Standing, Cuff Size: Normal)   Pulse 63   Ht 5\' 6"  (1.676 m)   Wt 188 lb (85.3 kg)   BMI 30.34 kg/m  Physical Exam Vitals reviewed.  Constitutional:      General: He is not in acute distress.    Appearance: He is obese. He is not ill-appearing (Notably improved from before his deep brain stimulator) or toxic-appearing.     Comments: Well-groomed.  Seems healthy.  HENT:     Head: Normocephalic and atraumatic.  Neck:     Vascular: No carotid bruit, hepatojugular reflux or JVD.  Cardiovascular:     Rate and Rhythm:  Normal rate and regular rhythm. Occasional extrasystoles are present.    Chest Wall: PMI is not displaced.     Pulses: Normal pulses.     Heart sounds: S1 normal and S2 normal. Heart sounds are distant. No murmur heard. No friction rub. No gallop.   Pulmonary:     Effort: Pulmonary effort is normal. No respiratory distress.     Breath sounds: Normal breath sounds.  Musculoskeletal:        General: Swelling (Trivial ankle) present. Normal range of motion.     Cervical back: Normal range of motion and neck supple.  Neurological:     General: No focal deficit present.     Mental Status: He is alert and oriented to person, place, and time.     Comments: Pill-rolling tremor is minimal  Psychiatric:        Mood and Affect: Mood normal.        Behavior: Behavior normal.        Thought Content: Thought content normal.        Judgment: Judgment normal.     Comments: He does have some memory loss.  Speech is much more fluent and fluid.     Adult ECG Report  Rate: 63 ;  Rhythm: normal sinus rhythm and LAFB.  ST-T wave changes concerning for repolarization versus lateral ischemia.;  Otherwise normal intervals and durations.  Narrative Interpretation: Stable  Recent Labs: Reviewed.  Due to be checked this year. -> May 06, 2019: TC 115, TG 90, HDL 39, LDL 58. A1c July 2021 was 7.  Lab Results  Component Value Date   CREATININE 1.09 02/04/2019   BUN 9 02/04/2019   NA 143 02/04/2019   K 3.9 02/04/2019   CL 104 02/04/2019   CO2  25 02/04/2019   CBC Latest Ref Rng & Units 11/05/2017 08/29/2017 08/01/2017  WBC 4.0 - 10.5 K/uL 6.1 4.5 4.4  Hemoglobin 13.0 - 17.0 g/dL 11.3(L) 10.9(L) 10.1(L)  Hematocrit 39.0 - 52.0 % 36.1(L) 34.7(L) 32.5(L)  Platelets 150 - 400 K/uL 160 128(L) 152    Lab Results  Component Value Date   TSH 7.022 (H) 05/16/2017    ==================================================  COVID-19 Education: The signs and symptoms of COVID-19 were discussed with the patient  and how to seek care for testing (follow up with PCP or arrange E-visit).   The importance of social distancing and COVID-19 vaccination was discussed today. The patient is practicing social distancing & Masking.   I spent a total of 23minutes with the patient spent in direct patient consultation.  Additional time spent with chart review  / charting (studies, outside notes, etc): 12 min Total Time: 35 min   Current medicines are reviewed at length with the patient today.  (+/- concerns) N/A -> Rarely takes his PRN Lasix.  This visit occurred during the SARS-CoV-2 public health emergency.  Safety protocols were in place, including screening questions prior to the visit, additional usage of staff PPE, and extensive cleaning of exam room while observing appropriate contact time as indicated for disinfecting solutions.  Notice: This dictation was prepared with Dragon dictation along with smaller phrase technology. Any transcriptional errors that result from this process are unintentional and may not be corrected upon review.  Patient Instructions / Medication Changes & Studies & Tests Ordered   Patient Instructions  Medication Instructions:  No changes *If you need a refill on your cardiac medications before your next appointment, please call your pharmacy*   Lab Work: Not needed  Please have primary to send a copy of lipids results    Testing/Procedures: Not needed   Follow-Up: At [facility], you and your health needs are our priority.  As part of our continuing mission to provide you with exceptional heart care, we have created designated Provider Care Teams.  These Care Teams include your primary Cardiologist (physician) and Advanced Practice Providers (APPs -  Physician Assistants and Nurse Practitioners) who all work together to provide you with the care you need, when you need it.  We recommend signing up for the patient portal called "MyChart".  Sign up information is  provided on this After Visit Summary.  MyChart is used to connect with patients for Virtual Visits (Telemedicine).  Patients are able to view lab/test results, encounter notes, upcoming appointments, etc.  Non-urgent messages can be sent to your provider as well.   To learn more about what you can do with MyChart, go to [email or web address].    Your next appointment:   12 month(s)  The format for your next appointment:   In Person  Provider:   Bryan Lemmaavid Ferris Fielden, MD     Studies Ordered:   Orders Placed This Encounter  Procedures  . EKG 12-Lead     Bryan Lemmaavid Iolanda Folson, M.D., M.S. Interventional Cardiologist   Pager # [phone] Phone # [phone] [address] [place], [place] [address]   Thank you for choosing Heartcare at [facility]!!

## 2020-06-22 ENCOUNTER — Ambulatory Visit (INDEPENDENT_AMBULATORY_CARE_PROVIDER_SITE_OTHER): Payer: PPO | Admitting: Family Medicine

## 2020-06-22 ENCOUNTER — Other Ambulatory Visit: Payer: Self-pay

## 2020-06-22 ENCOUNTER — Encounter: Payer: Self-pay | Admitting: Family Medicine

## 2020-06-22 VITALS — BP 142/70 | HR 69 | Ht 68.0 in | Wt 188.6 lb

## 2020-06-22 DIAGNOSIS — E785 Hyperlipidemia, unspecified: Secondary | ICD-10-CM | POA: Diagnosis not present

## 2020-06-22 DIAGNOSIS — F09 Unspecified mental disorder due to known physiological condition: Secondary | ICD-10-CM | POA: Diagnosis not present

## 2020-06-22 DIAGNOSIS — I619 Nontraumatic intracerebral hemorrhage, unspecified: Secondary | ICD-10-CM | POA: Diagnosis not present

## 2020-06-22 DIAGNOSIS — E119 Type 2 diabetes mellitus without complications: Secondary | ICD-10-CM

## 2020-06-22 DIAGNOSIS — R339 Retention of urine, unspecified: Secondary | ICD-10-CM | POA: Diagnosis not present

## 2020-06-22 DIAGNOSIS — G8110 Spastic hemiplegia affecting unspecified side: Secondary | ICD-10-CM | POA: Diagnosis not present

## 2020-06-22 LAB — POCT GLYCOSYLATED HEMOGLOBIN (HGB A1C): Hemoglobin A1C: 6.9 % — AB (ref 4.0–5.6)

## 2020-06-22 NOTE — Patient Instructions (Signed)
It was nice to meet you today!  I do not think you should be driving at all, due to the concerns about your ability to think clearly and process directions.  A1c today is excellent Checking cholesterol today  Follow up with Dr. Salvadore Dom in a few months.  Be well, Dr. [name]

## 2020-06-23 ENCOUNTER — Encounter: Payer: Self-pay | Admitting: Family Medicine

## 2020-06-23 LAB — LIPID PANEL
Chol/HDL Ratio: 2.8 ratio (ref 0.0–5.0)
Cholesterol, Total: 110 mg/dL (ref 100–199)
HDL: 40 mg/dL (ref >39)
LDL Chol Calc (NIH): 53 mg/dL (ref 0–99)
Triglycerides: 89 mg/dL (ref 0–149)
VLDL Cholesterol Cal: 17 mg/dL (ref 5–40)

## 2020-06-23 NOTE — Progress Notes (Signed)
  Date of Visit: 06/22/2020   SUBJECTIVE:   HPI:  Brendan [name] presents today for completion of DMV paperwork so he can keep his drivers license. He is accompanied by his wife who provides much of the history.  Reviewed last neurology note, which mentions that there was concern for interval development of dementia. Wife and patient have no concerns about his memory. He does all his ADLs independently and they think he has been driving without any issues.  Elected to do [facility] today which demonstrated score of 8/30. Patient had difficulty understanding instructions, even when correcting for him being hard of hearing (I very loudly read the instructions into his ear). Response was extremely slow and often he could not even attempt to respond to the questions.  Diabetes - diet controlled. Due for A1c.  CAD - needs lipids checked today per patient/wife.  OBJECTIVE:   BP (!) 142/70   Pulse 69   Ht 5\' 8"  (1.727 m)   Wt 188 lb 9.6 oz (85.5 kg)   SpO2 96%   BMI 28.68 kg/m  Gen: no acute distress, pleasant, cooperative HEENT: normocephalic, atraumatic. Hard of hearing.  Heart: regular rate and rhythm, no murmur Lungs: clear to auscultation bilaterally, normal work of breathing  Neuro: alert, speech normal, grossly nonfocal. Oriented to month, place, and city, but not to year, date, or day of week. Ext: No appreciable lower extremity edema bilaterally   ASSESSMENT/PLAN:   Cognitive disorder MOCA testing today demonstrates score of 8, with profound cognitive slowing and difficulty understanding directions. Discussed explicitly with patient and wife that I do not think he should drive, and that him driving is a danger to himself and everyone else on the road. Advised I cannot complete DMV paperwork for him. They were understanding.  Hyperlipidemia LDL goal <70 Update lipids today  Diabetes mellitus without complication (HCC) Remains diet controlled with a1c of 6.9. continue to monitor A1c  periodically.  FOLLOW UP: Follow up in PCP in a few mos for chronic medical problems  J. [place], MD [facility]  Greater than 40 minutes were spent on this encounter on the day of service, including pre-visit planning, actual face to face time, coordination of care, and documentation of visit.

## 2020-06-23 NOTE — Assessment & Plan Note (Signed)
Update lipids today

## 2020-06-23 NOTE — Assessment & Plan Note (Signed)
Remains diet controlled with a1c of 6.9. continue to monitor A1c periodically.

## 2020-06-23 NOTE — Assessment & Plan Note (Signed)
MOCA testing today demonstrates score of 8, with profound cognitive slowing and difficulty understanding directions. Discussed explicitly with patient and wife that I do not think he should drive, and that him driving is a danger to himself and everyone else on the road. Advised I cannot complete DMV paperwork for him. They were understanding.

## 2020-06-28 ENCOUNTER — Telehealth: Payer: Self-pay | Admitting: Neurology

## 2020-06-28 NOTE — Telephone Encounter (Signed)
Patient wife would like to speak to someone about the patient's memory

## 2020-06-28 NOTE — Telephone Encounter (Signed)
Spoke with Mrs Chalfin Unfortunately, Dr Tat cannot assist.  It was a MoCA.  He only scored 8.  His PCP discussed this with me and Dr Tat agreed with her recommendation of no driving.  The test we would give him would be much longer and more involved than this

## 2020-06-28 NOTE — Telephone Encounter (Signed)
Unfortunately, I cannot assist.  It was a MoCA.  He only scored 8.  His PCP discussed this with me and I agree with her recommendation of no driving.  The test we would give her would be much longer and more involved than this.

## 2020-06-28 NOTE — Telephone Encounter (Signed)
Spoke with pt wife at 1st she did not want to talk to me she wanted Dr Tat to call her or for Rhae Hammock to call her.  Mr Hardaway got his DMV paper work went to the PCP she made MR Ferdig do a memory test before she would do the paper work and he did not "pass" it. Wife said that the test was every hard and that she would not of even passed it. She stated that he really needs to drive and that he is a good driver. She is asking if Dr Tat will please help them and do his DMV paper work.

## 2020-07-05 ENCOUNTER — Encounter: Payer: Self-pay | Admitting: Cardiology

## 2020-07-05 NOTE — Assessment & Plan Note (Signed)
2-3 patent grafts.  Diagonal graft to a very small caliber diagonal is occluded.  No active angina symptoms.  He was more mobile before, but has gained back some weight.  I encouraged him to continue to stay active.  Plan: Continue beta-blocker (he is using Inderal because of tremor), along with aspirin and statin.  We took him off the ARB because of low blood pressures.  Blood pressure looks great now.

## 2020-07-05 NOTE — Assessment & Plan Note (Signed)
Labs look good in January 2021.  Due for labs recheck soon.

## 2020-07-05 NOTE — Assessment & Plan Note (Signed)
We talked about whether or not we would recheck a surveillance Myoview.  At this point, if he is not having any symptoms, he would prefer to avoid surveillance testing.

## 2020-07-05 NOTE — Assessment & Plan Note (Signed)
Blood pressure looks better off of ACE inhibitor.  With him having some dizziness leery of orthostatic hypotension.  As such, this current blood pressure is about as good as it should get.  Would not try to titrate up medications unless he has consistently elevated pressures.

## 2020-07-05 NOTE — Assessment & Plan Note (Addendum)
May be mild stable angina, well controlled.  He is on losartan and propranolol.  Not requiring nitroglycerin or Lasix.  Well-controlled. Is on statin with labs being checked by PCP-  Previous good control.  On maintenance aspirin 81 mg

## 2020-07-10 ENCOUNTER — Other Ambulatory Visit: Payer: Self-pay | Admitting: Family Medicine

## 2020-07-10 DIAGNOSIS — G629 Polyneuropathy, unspecified: Secondary | ICD-10-CM

## 2020-07-14 ENCOUNTER — Other Ambulatory Visit: Payer: Self-pay | Admitting: Family Medicine

## 2020-07-14 DIAGNOSIS — G629 Polyneuropathy, unspecified: Secondary | ICD-10-CM

## 2020-07-15 ENCOUNTER — Other Ambulatory Visit: Payer: Self-pay

## 2020-07-15 DIAGNOSIS — G629 Polyneuropathy, unspecified: Secondary | ICD-10-CM

## 2020-07-16 MED ORDER — TRAMADOL HCL 50 MG PO TABS
50.0000 mg | ORAL_TABLET | Freq: Four times a day (QID) | ORAL | 0 refills | Status: DC | PRN
Start: 2020-07-16 — End: 2020-10-07

## 2020-07-22 ENCOUNTER — Other Ambulatory Visit: Payer: Self-pay

## 2020-07-22 ENCOUNTER — Ambulatory Visit (INDEPENDENT_AMBULATORY_CARE_PROVIDER_SITE_OTHER): Payer: PPO | Admitting: Family Medicine

## 2020-07-22 VITALS — BP 139/68 | HR 68 | Ht 68.0 in | Wt 189.8 lb

## 2020-07-22 DIAGNOSIS — G8110 Spastic hemiplegia affecting unspecified side: Secondary | ICD-10-CM | POA: Diagnosis not present

## 2020-07-22 DIAGNOSIS — R269 Unspecified abnormalities of gait and mobility: Secondary | ICD-10-CM

## 2020-07-22 NOTE — Assessment & Plan Note (Signed)
Based on a brief chart review, he has well-known CNS pathology extending back at least 8 years.  He is previously been seen for ambulation issues in 2014.  It sounds like he has not been seen recently (within the past year) for any difficulties with his ambulation.  It sounds like he does see his neurologist regularly but has not had any recent discussions regarding mobility.  His physical exam today shows clear evidence of CNS, likely cerebellar, pathology but I am hesitant to retrace the steps of previous significant work-ups.  Has had multiple brain MRIs in the past and I do not think that there are new, acute findings that warrant new brain imaging.  I discussed this with his wife who is agreeable to move forward with additional physical therapy for strength and conditioning and close follow-up with neurology who knows Brendan [name] well and would be better placed to determine what further work-up is warranted. -Placed referral to physical therapy

## 2020-07-22 NOTE — Progress Notes (Signed)
    SUBJECTIVE:   CHIEF COMPLAINT / HPI:   Chronic lower extremity pain and difficulty with ambulation Brendan [name] was brought into the office today by his wife who is concerned that he is having worsening of his chronic mobility issues.  His medical history is notable for spastic hemiparesis, hemiplegia, intention tremor requiring deep brain stimulation.  For the past 3-4 months his wife is noticed that he seems to have more trouble moving around.  He sometimes complains of diffuse pain over his entire lower extremities.  This discomfort does not seem to localize to any one place.  He also seems to walk more slowly around the house and need to stop for support more frequently.  Currently has both a cane and a walker at home.  PERTINENT  PMH / PSH: spastic hemiparesis, hemiplegia, intention tremor requiring deep brain stimulation, neuropathy, intracerebral hemorrhage, cognitive disorder  OBJECTIVE:   BP 139/68   Pulse 68   Ht 5\' 8"  (1.727 m)   Wt 189 lb 12.8 oz (86.1 kg)   SpO2 96%   BMI 28.86 kg/m    General: Alert and cooperative and appears to be in no acute distress.  Able to stand up from his chair independently and walk to the door and back.  His gait to the door was notably wide-based.  He moved with a slow, shuffling gait and turned around very slowly at the door.  It took him the better part of a minute to stand, moved to the door and return to his seat which was a total distance of about 8 feet. Cardio: Normal S1 and S2, no S3 or S4. Rhythm is regular. No murmurs or rubs.   Pulm: Clear to auscultation bilaterally, no crackles, wheezing, or diminished breath sounds. Normal respiratory effort Abdomen: Bowel sounds normal. Abdomen soft and non-tender.  Extremities: No peripheral edema. Warm/ well perfused.  Strong radial pulses. Neuro: Cranial nerves grossly intact.  Unable to perform heel-to-shin testing.  It was unclear if this was due to poor hip mobility or difficulty with  coordination.  Finger-to-nose testing was also notable for significant shaking and increased oscillations as he neared the target (the finger).  ASSESSMENT/PLAN:   Gait disorder Based on a brief chart review, he has well-known CNS pathology extending back at least 8 years.  He is previously been seen for ambulation issues in 2014.  It sounds like he has not been seen recently (within the past year) for any difficulties with his ambulation.  It sounds like he does see his neurologist regularly but has not had any recent discussions regarding mobility.  His physical exam today shows clear evidence of CNS, likely cerebellar, pathology but I am hesitant to retrace the steps of previous significant work-ups.  Has had multiple brain MRIs in the past and I do not think that there are new, acute findings that warrant new brain imaging.  I discussed this with his wife who is agreeable to move forward with additional physical therapy for strength and conditioning and close follow-up with neurology who knows Brendan [name] well and would be better placed to determine what further work-up is warranted. -Placed referral to physical therapy     Cory Roughen, MD [facility]

## 2020-07-22 NOTE — Patient Instructions (Signed)
I am sorry to hear that Brendan [name] has been having more difficulty with his walking recently.  I do think the best thing we can do today is getting connected to physical therapy.  I think that a lot of his problems seem to be neurological.  I highly recommend that you try to get another appointment with your neurologist to discuss any progressing neurological problems.  I understand that he does have other medical issues that are addressed with neurology but I do not see any recent neurology notes that if specifically addressed his walking.

## 2020-07-22 NOTE — Addendum Note (Signed)
Addended by: Nada Libman on: 07/22/2020 06:43 PM   Modules accepted: Orders

## 2020-07-23 NOTE — Progress Notes (Signed)
Assessment/Plan:    1.   Essential Tremor             Patient is status post bilateral DBS to the bilateral VIM on May 04, 2017.  Patient had postoperative bleed on the left, with resultant hematoma causing paralysis on the right, which has since resolved.  Because of complications, the patient's IPG was not placed until July, 2019.    -dbs changed today to help R tremor and lowered frequency to see if would help gait  -consider dat if other w/u unremarkable.  Has little bit of rest tremor on the right.  Otherwise doesn't look parkinsonian, although is off balance  2.  History of myoclonus.             -This was related to gabapentin.  Resolved when gabapentin was discontinued.  -they asked about gabapentin today (to start it for PN sx's).  Don't want to give it back right now d/t confusion and hx of myoclonus.  3.  Memory loss  -Wife taking full-time care of the patient now.  Suspect that patient may have developed dementia with time.  Encouraged patient to stay active, both mentally and physically.  -Think that the slowing down of gait is made worse by apraxia from memory change but he most certainly looks ataxic today  4.  Probable PN  -likely affecting gait  -labs today:  b12 (has hx of deficiency and not on supplement), folate, spep/upep with immunofix  -he reported he was not diabetic but A1C went from 5.8 three years ago to 6.9 one month ago.  Told them to f/u with PCP.  Suspect he is diabetic, whether or not he requires tx.  -has a bit more ataxia than one might expect so will do MRI brain.  Will try to do MRI c-spine (not sure if we will be able to do that at [facility] given dbs and if not, we decided to hold on it - he does have some neck pain).  Will need medtronic rep present  5.  blepharospasm  -discussed botox but decided to hold on that for right now.  They can let me know if they want to proceed.   Subjective:   Brendan [name] was seen today, accompanied by his  wife who supplements the history.  Last visit, I was concerned that patient had developed a dementia.  Wife is taking full-time care of the patient.  Since last visit, his primary care, Brendan [name], reached out to me that she was also concerned about the patients memory.  She stated that patient's family had asked her to fill out a form approving his driver's license, and she did a MoCA first and patient did not do well on it.  I agreed with her that he should not be driving.  His wife contacted me, as she felt that he should be driving, but I told her that I concurred with Brendan [name] findings.  I also told her that if we ordered testing (neurocognitive testing) it would be much longer and much more involved than a standard MoCA.  Just last Thursday, the patient followed up with another primary care physician, Brendan [name], who told the patient to contact me for a visit today.  Patient's wife expressed concern to Brendan [name] over the fact that patient was having trouble ambulating.  His primary care doctor referred him to physical therapy.  Today, his daughter accompanies him and states that he is having shoulder pain (which  wife is on the phone and recognizes is "rotary cuff.")  Pt states that he has feet paresthesias and pain from knees down.  He has some occasional hand paresthesias as well.  Some more trouble ambulating and using walker but no falls since our last visit.  Occasional headaches - 1 x per week.  They don't know location in the head.  Tylenol relieves.  Wife asks about looking at dbs - little more tremor   ALLERGIES:   Allergies  Allergen Reactions  . Lisinopril Cough    CURRENT MEDICATIONS:  Outpatient Encounter Medications as of 07/26/2020  Medication Sig  . albuterol (PROAIR HFA) 108 (90 Base) MCG/ACT inhaler INHALE 2 PUFFS INTO THE LUNGS 4 TIMES DAILY AS NEEDED  . ALLERGY RELIEF 10 MG tablet TAKE 1 TABLET BY MOUTH DAILY  . aspirin EC 81 MG tablet Take 81 mg by mouth daily.  [name]  atorvastatin (LIPITOR) 40 MG tablet TAKE 1 TABLET BY MOUTH DAILY  . fluticasone (FLONASE) 50 MCG/ACT nasal spray USE 2 SPRAYS IN BOTH NOSTRILS DAILY  . furosemide (LASIX) 20 MG tablet Take 1 tablet twice a week (Sunday, Thursday) (Patient taking differently: Take 20 mg by mouth. Take 1 tablet twice a week (Sunday, Thursday). Pt takes PRN)  . Melatonin 3 MG TABS Take 1 tablet (3 mg total) by mouth at bedtime.  . methocarbamol (ROBAXIN) 750 MG tablet TAKE 1 TABLET BY MOUTH TWICE DAILY AS NEEDED FOR MUSCLE SPASMS  . nitroGLYCERIN (NITROSTAT) 0.4 MG SL tablet Place 1 tablet (0.4 mg total) under the tongue every 5 (five) minutes as needed for chest pain. call 911 if chest pain not better  . omeprazole (PRILOSEC) 20 MG capsule TAKE 1 CAPSULE BY MOUTH DAILY AS NEEDED FOR ACID REFLUX  . propranolol (INDERAL) 40 MG tablet Take 1 tablet (40 mg total) by mouth 2 (two) times daily.  . traMADol (ULTRAM) 50 MG tablet Take 1 tablet (50 mg total) by mouth every 6 (six) hours as needed for moderate pain.  . traZODone (DESYREL) 100 MG tablet TAKE 1 TABLET BY MOUTH EACH NIGHT AT BEDTIME   Facility-Administered Encounter Medications as of 07/26/2020  Medication  . 0.9 %  sodium chloride infusion  . sodium chloride 0.9 % injection 3 mL     Objective:    PHYSICAL EXAMINATION:    VITALS:   Vitals:   07/26/20 0833  BP: 112/60  Pulse: 60  SpO2: 98%  Weight: 189 lb (85.7 kg)  Height: 5\' 8"  (1.727 m)    GEN:  The patient appears stated age and is in NAD. HEENT:  Normocephalic, atraumatic.  The mucous membranes are moist. The superficial temporal arteries are without ropiness or tenderness. CV:  RRR Lungs:  CTAB Neck/HEME:  There are no carotid bruits bilaterally.  Neurological examination:  Orientation: The patient is alert and oriented to person and place.  He does look to his family for finer aspects of hx.   Cranial nerves: There is good facial symmetry. The speech is fluent and clear. Soft palate  rises symmetrically and there is no tongue deviation. Hearing is intact to conversational tone.  He does have intermittent blepharospasm. Sensation: Sensation is intact to light touch throughout.  Pin ? Decreased in stocking distribution but equal bilaterally.  Has trouble with participating with DSS Motor: Strength is at least antigravity x4. DTR's:  2/4 at the bilateral biceps, triceps and patella  Movement examination: Tone: There is normal tone in the UE/LE (does have trouble relaxing) Abnormal movements:  he has mild rest tremor of the R thumb.  Mild postural tremor on the R (better after programming).  Coordination:  There is no decremation with RAM's Gait and Station: The patient pushes off of the chair to arise.  He almost appears spastic when he first gets up and then he grabs his walker.  He is wide-based and short stepped with the walker.  Without it, he becomes somewhat ataxic.  I have reviewed and interpreted the following labs independently   Chemistry      Component Value Date/Time   NA 143 02/04/2019 1534   K 3.9 02/04/2019 1534   CL 104 02/04/2019 1534   CO2 25 02/04/2019 1534   BUN 9 02/04/2019 1534   CREATININE 1.09 02/04/2019 1534   CREATININE 1.02 05/01/2016 1118      Component Value Date/Time   CALCIUM 9.2 02/04/2019 1534   ALKPHOS 159 (H) 05/06/2019 1304   AST 20 05/06/2019 1304   ALT 20 05/06/2019 1304   BILITOT 1.1 05/06/2019 1304     Lab Results  Component Value Date   VITAMINB12 184 07/17/2017    Total time spent on today's visit was 57 minutes, including both face-to-face time and nonface-to-face time.  Time included that spent on review of records (prior notes available to me/labs/imaging if pertinent), discussing treatment and goals, answering patient's questions and coordinating care.  This didn't include dbs time  Cc:  Autry-Lott, Randa Evens, DO

## 2020-07-26 ENCOUNTER — Ambulatory Visit: Payer: PPO | Admitting: Neurology

## 2020-07-26 ENCOUNTER — Encounter: Payer: Self-pay | Admitting: Neurology

## 2020-07-26 ENCOUNTER — Other Ambulatory Visit (INDEPENDENT_AMBULATORY_CARE_PROVIDER_SITE_OTHER): Payer: PPO

## 2020-07-26 ENCOUNTER — Other Ambulatory Visit: Payer: Self-pay

## 2020-07-26 VITALS — BP 112/60 | HR 60 | Ht 68.0 in | Wt 189.0 lb

## 2020-07-26 DIAGNOSIS — M542 Cervicalgia: Secondary | ICD-10-CM | POA: Diagnosis not present

## 2020-07-26 DIAGNOSIS — E538 Deficiency of other specified B group vitamins: Secondary | ICD-10-CM

## 2020-07-26 DIAGNOSIS — G250 Essential tremor: Secondary | ICD-10-CM

## 2020-07-26 DIAGNOSIS — R270 Ataxia, unspecified: Secondary | ICD-10-CM

## 2020-07-26 LAB — B12 AND FOLATE PANEL
Folate: 12.9 ng/mL (ref >5.9)
Vitamin B-12: 255 pg/mL (ref 211–911)

## 2020-07-26 NOTE — Procedures (Signed)
DBS Programming was performed.    Manufacturer of DBS device: [employer]  Total time spent programming was 8 minutes.  Device was confirmed to be on.  Soft start was confirmed to be on.  Impedences were checked and were within normal limits.  Battery was checked and was determined to be functioning normally and not near the end of life.  Final settings were as follows:   Active Contacts Amplitude (V) PW (ms) Frequency (hz) Battery  Left Brain       08/07/19 1-0+ 3.3 90 160 2.97  06/09/20 1-0+ 3.3 90 160 2.93  07/26/20 1-0+ 4.0 90 150 2.92         Right Brain       08/07/19 1-0+ 2.7 90 150 2.98  06/09/20 1-0+ 2.7 90 150 2.95  07/26/20 1-0+ 2.8 90 145 2.94

## 2020-07-26 NOTE — Patient Instructions (Addendum)
Your provider has requested that you have labwork completed today. Please go to [facility] ([id number]) on the second floor of this building before leaving the office today. You do not need to check in. If you are not called within 15 minutes please check with the front desk.   We have ordered a MRI on your Brendan [name] and Neck. Once these tests are approved through your insurance we will contact you to get these test scheduled.   Your physician recommends that you schedule a follow-up appointment in:  8-10 weeks for DBS (60 minute appointment)

## 2020-07-28 ENCOUNTER — Telehealth: Payer: Self-pay

## 2020-07-28 DIAGNOSIS — H409 Unspecified glaucoma: Secondary | ICD-10-CM | POA: Diagnosis not present

## 2020-07-28 DIAGNOSIS — I10 Essential (primary) hypertension: Secondary | ICD-10-CM | POA: Diagnosis not present

## 2020-07-28 DIAGNOSIS — I25708 Atherosclerosis of coronary artery bypass graft(s), unspecified, with other forms of angina pectoris: Secondary | ICD-10-CM | POA: Diagnosis not present

## 2020-07-28 DIAGNOSIS — M4802 Spinal stenosis, cervical region: Secondary | ICD-10-CM | POA: Diagnosis not present

## 2020-07-28 DIAGNOSIS — G8110 Spastic hemiplegia affecting unspecified side: Secondary | ICD-10-CM | POA: Diagnosis not present

## 2020-07-28 DIAGNOSIS — K449 Diaphragmatic hernia without obstruction or gangrene: Secondary | ICD-10-CM | POA: Diagnosis not present

## 2020-07-28 DIAGNOSIS — F419 Anxiety disorder, unspecified: Secondary | ICD-10-CM | POA: Diagnosis not present

## 2020-07-28 DIAGNOSIS — E785 Hyperlipidemia, unspecified: Secondary | ICD-10-CM | POA: Diagnosis not present

## 2020-07-28 DIAGNOSIS — N400 Enlarged prostate without lower urinary tract symptoms: Secondary | ICD-10-CM | POA: Diagnosis not present

## 2020-07-28 DIAGNOSIS — R413 Other amnesia: Secondary | ICD-10-CM | POA: Diagnosis not present

## 2020-07-28 DIAGNOSIS — K219 Gastro-esophageal reflux disease without esophagitis: Secondary | ICD-10-CM | POA: Diagnosis not present

## 2020-07-28 DIAGNOSIS — E1142 Type 2 diabetes mellitus with diabetic polyneuropathy: Secondary | ICD-10-CM | POA: Diagnosis not present

## 2020-07-28 DIAGNOSIS — G252 Other specified forms of tremor: Secondary | ICD-10-CM | POA: Diagnosis not present

## 2020-07-28 LAB — PROTEIN ELECTROPHORESIS, SERUM
Albumin ELP: 4 g/dL (ref 3.8–4.8)
Alpha 1: 0.3 g/dL (ref 0.2–0.3)
Alpha 2: 0.7 g/dL (ref 0.5–0.9)
Beta 2: 0.4 g/dL (ref 0.2–0.5)
Beta Globulin: 0.5 g/dL (ref 0.4–0.6)
Gamma Globulin: 1 g/dL (ref 0.8–1.7)
Total Protein: 7 g/dL (ref 6.1–8.1)

## 2020-07-28 LAB — IMMUNOFIXATION, SERUM
IgA/Immunoglobulin A, Serum: 476 mg/dL — ABNORMAL HIGH (ref 61–437)
IgG (Immunoglobin G), Serum: 1088 mg/dL (ref 603–1613)
IgM (Immunoglobulin M), Srm: 31 mg/dL (ref 15–143)

## 2020-07-28 NOTE — Telephone Encounter (Signed)
[name] calls nurse line requesting verbal orders for the following.  HH PT 2x a week for 4 weeks  Social work evaluation for transportation needs.   Verbal order given per [facility] protocol.

## 2020-07-29 ENCOUNTER — Telehealth: Payer: Self-pay

## 2020-07-29 DIAGNOSIS — M25512 Pain in left shoulder: Secondary | ICD-10-CM | POA: Diagnosis not present

## 2020-07-29 DIAGNOSIS — M5412 Radiculopathy, cervical region: Secondary | ICD-10-CM | POA: Diagnosis not present

## 2020-07-29 NOTE — Telephone Encounter (Signed)
Brendan [name] calls nurse line stating the patient is taking 1000mg  B12, however this medication is not on his medication list. reports he was recently in the hospital and unsure if this was added or taken away. Will forward to PCP for clarification as this is not on his updated medication list.

## 2020-08-03 ENCOUNTER — Telehealth: Payer: Self-pay

## 2020-08-03 NOTE — Telephone Encounter (Signed)
Peter with [facility] PT called asking if pt has B12 1000mg  on his medication list and if Dr Tat prescribed it, looking at notes the therapist as called pt PCP as well asking if pt is on this medication, B12 is not on his medication list

## 2020-08-03 NOTE — Telephone Encounter (Signed)
I'm not sure that [facility] needs to know that but yes, I told the patient to take it last week after we checked his lab and it was low

## 2020-08-04 MED ORDER — VITAMIN B 12 500 MCG PO TABS: 1000.0000 ug | ORAL_TABLET | Freq: Every day | ORAL | 0 refills | Status: DC

## 2020-08-04 NOTE — Telephone Encounter (Signed)
Spoke with pt wife she is unsure as to why [facility] PT is asking about the B12 but the pt is taken it daily. Pt med list is updated with B12 on it

## 2020-08-04 NOTE — Addendum Note (Signed)
Addended by: Dimas Chyle on: 08/04/2020 08:28 AM   Modules accepted: Orders

## 2020-08-05 ENCOUNTER — Other Ambulatory Visit: Payer: Self-pay | Admitting: Cardiology

## 2020-08-05 ENCOUNTER — Other Ambulatory Visit: Payer: Self-pay | Admitting: Family Medicine

## 2020-08-05 DIAGNOSIS — G629 Polyneuropathy, unspecified: Secondary | ICD-10-CM

## 2020-08-15 DIAGNOSIS — G252 Other specified forms of tremor: Secondary | ICD-10-CM | POA: Diagnosis not present

## 2020-08-15 DIAGNOSIS — N400 Enlarged prostate without lower urinary tract symptoms: Secondary | ICD-10-CM | POA: Diagnosis not present

## 2020-08-15 DIAGNOSIS — E785 Hyperlipidemia, unspecified: Secondary | ICD-10-CM | POA: Diagnosis not present

## 2020-08-15 DIAGNOSIS — F419 Anxiety disorder, unspecified: Secondary | ICD-10-CM | POA: Diagnosis not present

## 2020-08-15 DIAGNOSIS — M4802 Spinal stenosis, cervical region: Secondary | ICD-10-CM | POA: Diagnosis not present

## 2020-08-15 DIAGNOSIS — K449 Diaphragmatic hernia without obstruction or gangrene: Secondary | ICD-10-CM | POA: Diagnosis not present

## 2020-08-15 DIAGNOSIS — R413 Other amnesia: Secondary | ICD-10-CM | POA: Diagnosis not present

## 2020-08-15 DIAGNOSIS — I10 Essential (primary) hypertension: Secondary | ICD-10-CM | POA: Diagnosis not present

## 2020-08-15 DIAGNOSIS — K219 Gastro-esophageal reflux disease without esophagitis: Secondary | ICD-10-CM | POA: Diagnosis not present

## 2020-08-15 DIAGNOSIS — G8110 Spastic hemiplegia affecting unspecified side: Secondary | ICD-10-CM | POA: Diagnosis not present

## 2020-08-15 DIAGNOSIS — I25708 Atherosclerosis of coronary artery bypass graft(s), unspecified, with other forms of angina pectoris: Secondary | ICD-10-CM | POA: Diagnosis not present

## 2020-08-15 DIAGNOSIS — H409 Unspecified glaucoma: Secondary | ICD-10-CM | POA: Diagnosis not present

## 2020-08-15 DIAGNOSIS — E1142 Type 2 diabetes mellitus with diabetic polyneuropathy: Secondary | ICD-10-CM | POA: Diagnosis not present

## 2020-08-18 ENCOUNTER — Telehealth: Payer: Self-pay | Admitting: Neurology

## 2020-08-18 NOTE — Telephone Encounter (Signed)
I contacted patient regarding hearing test. Looking into why it is not scheduled daughters number sandy [phone]. [employer] serial number [id number] Phone number [phone].  Under [employer] [id number]. Contacted [place], awaiting Natasha simpson to contact office back. [place] [phone].

## 2020-08-18 NOTE — Telephone Encounter (Signed)
Aram Candela is currently working on this, she will follow up soon and let us know. 08/18/2020 at 4:06pm

## 2020-08-18 NOTE — Telephone Encounter (Signed)
1. Patient's daughter called and said the patient has a hearing test exam at 1:00 pm today.   She said he has a DBS and she wants to confirm there is no concern about hearing testing.  2. Any word on the MRI scheduling with [employer]?

## 2020-08-18 NOTE — Telephone Encounter (Signed)
Please advise on DBS. thanks

## 2020-08-18 NOTE — Telephone Encounter (Signed)
Hearing test should be fine You will need to check on MRI brain/cervical with radiology.  I see we ordered it.  Why hasn't it been scheduled.  Can [facility] do cervical on a post DBS pt?  The [employer] rep will need to be present when it is scheduled so you will need to contact cindy with appt day/time

## 2020-08-23 ENCOUNTER — Telehealth: Payer: Self-pay

## 2020-08-23 NOTE — Telephone Encounter (Signed)
Patient called and details given.

## 2020-08-23 NOTE — Telephone Encounter (Signed)
ok patient is scheduled for 09/07/20 at 2pm at [facility], patient will need to be there at 130pm with remote fully charged. Wife stated that Dr Tat usually calls [employer] to maker sure the Rep is there while patient is getting scanned. Wife also stated that if you could please call her once you all have spoken with Rep so that she will know that everything is done. Thanks. Mri is scheduled for the 09/07/2020. Dr.Tat please advise on details for [place] to do for the rep.

## 2020-08-30 ENCOUNTER — Telehealth: Payer: Self-pay

## 2020-08-30 NOTE — Telephone Encounter (Signed)
[name] calls nurse line requesting verbal orders to extend [facility] PT as follows.  2x a week for 2 weeks 1x a week for 3 weeks   Verbal order given per [facility] protocol.

## 2020-09-01 NOTE — Telephone Encounter (Signed)
This is scheduled, can close enounter

## 2020-09-01 NOTE — Telephone Encounter (Signed)
Has this been completed?  Sending to clinical staff for review: Okay to sign/close encounter or is further follow up needed? 

## 2020-09-03 ENCOUNTER — Other Ambulatory Visit: Payer: Self-pay | Admitting: Family Medicine

## 2020-09-03 DIAGNOSIS — G629 Polyneuropathy, unspecified: Secondary | ICD-10-CM

## 2020-09-07 ENCOUNTER — Ambulatory Visit (HOSPITAL_COMMUNITY)
Admission: RE | Admit: 2020-09-07 | Discharge: 2020-09-07 | Disposition: A | Discharge status: Home / Self Care | Payer: PPO | Source: Ambulatory Visit | Attending: Neurology | Admitting: Neurology

## 2020-09-07 ENCOUNTER — Other Ambulatory Visit: Payer: Self-pay

## 2020-09-07 DIAGNOSIS — M542 Cervicalgia: Secondary | ICD-10-CM | POA: Insufficient documentation

## 2020-09-07 DIAGNOSIS — E538 Deficiency of other specified B group vitamins: Secondary | ICD-10-CM | POA: Diagnosis not present

## 2020-09-07 DIAGNOSIS — G319 Degenerative disease of nervous system, unspecified: Secondary | ICD-10-CM | POA: Diagnosis not present

## 2020-09-07 DIAGNOSIS — R270 Ataxia, unspecified: Secondary | ICD-10-CM | POA: Insufficient documentation

## 2020-09-08 ENCOUNTER — Telehealth: Payer: Self-pay | Admitting: Neurology

## 2020-09-08 NOTE — Telephone Encounter (Signed)
LMOM for patient to call back to get on the schedule 09-20-20

## 2020-09-08 NOTE — Telephone Encounter (Signed)
Please call patient and put him in on 6/6 to discuss results of MRI.  VV okay if they can't get it.

## 2020-09-09 NOTE — Telephone Encounter (Signed)
Thx.  Tentatively put him on there so its not taken please.  thanks

## 2020-09-09 NOTE — Telephone Encounter (Signed)
Patient scheduled 09/20/20 at 10:15 AM.

## 2020-09-15 DIAGNOSIS — K449 Diaphragmatic hernia without obstruction or gangrene: Secondary | ICD-10-CM | POA: Diagnosis not present

## 2020-09-15 DIAGNOSIS — F419 Anxiety disorder, unspecified: Secondary | ICD-10-CM | POA: Diagnosis not present

## 2020-09-15 DIAGNOSIS — E1142 Type 2 diabetes mellitus with diabetic polyneuropathy: Secondary | ICD-10-CM | POA: Diagnosis not present

## 2020-09-15 DIAGNOSIS — N400 Enlarged prostate without lower urinary tract symptoms: Secondary | ICD-10-CM | POA: Diagnosis not present

## 2020-09-15 DIAGNOSIS — I10 Essential (primary) hypertension: Secondary | ICD-10-CM | POA: Diagnosis not present

## 2020-09-15 DIAGNOSIS — H409 Unspecified glaucoma: Secondary | ICD-10-CM | POA: Diagnosis not present

## 2020-09-15 DIAGNOSIS — R413 Other amnesia: Secondary | ICD-10-CM | POA: Diagnosis not present

## 2020-09-15 DIAGNOSIS — M4802 Spinal stenosis, cervical region: Secondary | ICD-10-CM | POA: Diagnosis not present

## 2020-09-15 DIAGNOSIS — E785 Hyperlipidemia, unspecified: Secondary | ICD-10-CM | POA: Diagnosis not present

## 2020-09-15 DIAGNOSIS — K219 Gastro-esophageal reflux disease without esophagitis: Secondary | ICD-10-CM | POA: Diagnosis not present

## 2020-09-15 DIAGNOSIS — I25708 Atherosclerosis of coronary artery bypass graft(s), unspecified, with other forms of angina pectoris: Secondary | ICD-10-CM | POA: Diagnosis not present

## 2020-09-15 DIAGNOSIS — G8110 Spastic hemiplegia affecting unspecified side: Secondary | ICD-10-CM | POA: Diagnosis not present

## 2020-09-15 DIAGNOSIS — G252 Other specified forms of tremor: Secondary | ICD-10-CM | POA: Diagnosis not present

## 2020-09-16 NOTE — Progress Notes (Signed)
Assessment/Plan:    1.   Essential Tremor             Patient is status post bilateral DBS to the bilateral VIM on May 04, 2017.  Patient had postoperative bleed on the left, with resultant hematoma causing paralysis on the right, which has since resolved.  Because of complications, the patient's IPG was not placed until July, 2019.     2.  History of myoclonus.             -This was related to gabapentin.  Resolved when gabapentin was discontinued.  -they asked about gabapentin today (to start it for PN sx's).  Don't want to give it back right now d/t confusion and hx of myoclonus.  3.  Probable dementia  -Wife taking full-time care of the patient now.  Suspect that patient may have developed dementia with time.    -Understands that we do not want him driving.  Declines neurocognitive testing.  4.  Ataxia  -Certainly, I think at the cord abnormalities in the cervical spinal cord are likely contributing.  While he has had them for a long time, he has become more ataxic the last few months.  Films personally shown and reviewed with pt/family  -Peripheral neuropathy can be contributing, but I do not think that this is the main reason.  -He does have a B12 deficiency and should be on supplementation, 1000 mcg daily.  -Do not think that the left shoulder pain is related to the abnormalities in the cervical cord.  The patient really looks like he has a rotator cuff issue (cannot raise arm above about 60 degrees on the left).  He is following with Dr. Thurston Hole   5.  blepharospasm  -discussed botox but decided to hold on that for right now.  They can let me know if they want to proceed.  6.  dizziness  -ask cardiology about the beta blocker.  He wrote in the last note that pt on beta blocker for tremor and that shouldn't be the case at all.  Will send note to Dr. Herbie Baltimore but asked family to f/u with him as well   Subjective:   Brendan [name] was seen today, accompanied by his wife  and daughter who supplements the history.  Last visit, I was concerned that the patient looked more ataxic than I had previously seen.  His MRI of the brain was stable.  MRI of the cervical spine, however, demonstrated severe central canal stenosis at C3-C4 through C6-C7, but it is most severe at C3-C4 and C4-C5.  There was some possible early cord signal abnormality at C3-C4.  The findings have worsened since his 2013 examination.  I asked the patient at last visit to make sure he followed up with his primary care physician, as his hemoglobin A1c had jumped from 5.8-6.9.    C/o dizzy but not sure if the dizzy is lightheadedness or off balance but seems to be possibly lightheaded  - pt will stop and shake his head and just state that "dizzy."  Having left shoulder pain.  Seeing Dr. Thurston Hole.   ALLERGIES:   Allergies  Allergen Reactions  . Lisinopril Cough    CURRENT MEDICATIONS:  Outpatient Encounter Medications as of 09/20/2020  Medication Sig  . albuterol (PROAIR HFA) 108 (90 Base) MCG/ACT inhaler INHALE 2 PUFFS INTO THE LUNGS 4 TIMES DAILY AS NEEDED  . ALLERGY RELIEF 10 MG tablet TAKE 1 TABLET BY MOUTH DAILY  . aspirin  EC 81 MG tablet Take 81 mg by mouth daily.  [name] atorvastatin (LIPITOR) 40 MG tablet TAKE 1 TABLET BY MOUTH DAILY  . Cyanocobalamin (VITAMIN B 12) 500 MCG TABS Take 1,000 mcg by mouth daily.  . fluticasone (FLONASE) 50 MCG/ACT nasal spray USE 2 SPRAYS IN BOTH NOSTRILS DAILY  . furosemide (LASIX) 20 MG tablet Take 1 tablet twice a week (Sunday, Thursday) (Patient taking differently: Patient takes as needed)  . Melatonin 3 MG TABS Take 1 tablet (3 mg total) by mouth at bedtime.  . methocarbamol (ROBAXIN) 750 MG tablet TAKE 1 TABLET BY MOUTH TWICE DAILY AS NEEDED FOR MUSCLE SPASMS  . nitroGLYCERIN (NITROSTAT) 0.4 MG SL tablet Place 1 tablet (0.4 mg total) under the tongue every 5 (five) minutes as needed for chest pain. call 911 if chest pain not better  . omeprazole (PRILOSEC) 20 MG  capsule TAKE 1 CAPSULE BY MOUTH DAILY AS NEEDED FOR ACID REFLUX  . propranolol (INDERAL) 40 MG tablet TAKE 1 TABLET BY MOUTH TWICE DAILY  . traMADol (ULTRAM) 50 MG tablet Take 1 tablet (50 mg total) by mouth every 6 (six) hours as needed for moderate pain.  . traZODone (DESYREL) 100 MG tablet TAKE 1 TABLET BY MOUTH EACH NIGHT AT BEDTIME   Facility-Administered Encounter Medications as of 09/20/2020  Medication  . 0.9 %  sodium chloride infusion  . sodium chloride 0.9 % injection 3 mL     Objective:    PHYSICAL EXAMINATION:    VITALS:   Vitals:   09/20/20 0950  BP: (!) 106/56  Pulse: 68  SpO2: 96%  Weight: 187 lb (84.8 kg)  Height: 5\' 6"  (1.676 m)    GEN:  The patient appears stated age and is in NAD. HEENT:  Normocephalic, atraumatic.  The mucous membranes are moist.   Neurological examination:  Orientation: The patient is alert and oriented to person and place.  He does look to his family for finer aspects of hx.   Cranial nerves: There is good facial symmetry. The speech is somewhat dysphasic, but he is clear. Soft palate rises symmetrically and there is no tongue deviation. Hearing is intact to conversational tone.  He does have intermittent blepharospasm. Motor: Strength is at least antigravity x4.  Has trouble raising the left arm above about 60 degrees.   I have reviewed and interpreted the following labs independently   Chemistry      Component Value Date/Time   NA 143 02/04/2019 1534   K 3.9 02/04/2019 1534   CL 104 02/04/2019 1534   CO2 25 02/04/2019 1534   BUN 9 02/04/2019 1534   CREATININE 1.09 02/04/2019 1534   CREATININE 1.02 05/01/2016 1118      Component Value Date/Time   CALCIUM 9.2 02/04/2019 1534   ALKPHOS 159 (H) 05/06/2019 1304   AST 20 05/06/2019 1304   ALT 20 05/06/2019 1304   BILITOT 1.1 05/06/2019 1304     Lab Results  Component Value Date   VITAMINB12 255 07/26/2020    Total time spent on today's visit was 30 minutes, including both  face-to-face time and nonface-to-face time.  Time included that spent on review of records (prior notes available to me/labs/imaging if pertinent), discussing treatment and goals, answering patient's questions and coordinating care.  This didn't include dbs time  Cc:  Autry-Lott, 09/25/2020, DO

## 2020-09-20 ENCOUNTER — Ambulatory Visit: Payer: PPO | Admitting: Neurology

## 2020-09-20 ENCOUNTER — Other Ambulatory Visit: Payer: Self-pay

## 2020-09-20 ENCOUNTER — Encounter: Payer: Self-pay | Admitting: Neurology

## 2020-09-20 VITALS — BP 106/56 | HR 68 | Ht 66.0 in | Wt 187.0 lb

## 2020-09-20 DIAGNOSIS — G729 Myopathy, unspecified: Secondary | ICD-10-CM | POA: Diagnosis not present

## 2020-09-20 NOTE — Patient Instructions (Signed)
1.  I will send referral to Dr. [name] but you can call their office as well 2.  Contact Dr. Herbie Baltimore regarding the need for the beta blocker

## 2020-09-21 ENCOUNTER — Encounter: Payer: PPO | Admitting: Neurology

## 2020-09-24 ENCOUNTER — Telehealth: Payer: Self-pay

## 2020-09-24 NOTE — Telephone Encounter (Signed)
Brendan [name] [facility] PT calls nurse line requesting verbal orders to extend [facility] PT as follows.   1x a week for 4 weeks.   Verbal order given per [facility] protocol.

## 2020-09-26 ENCOUNTER — Telehealth: Payer: Self-pay | Admitting: Neurology

## 2020-09-26 DIAGNOSIS — K219 Gastro-esophageal reflux disease without esophagitis: Secondary | ICD-10-CM | POA: Diagnosis not present

## 2020-09-26 DIAGNOSIS — G20 Parkinson's disease: Secondary | ICD-10-CM | POA: Diagnosis not present

## 2020-09-26 DIAGNOSIS — G8110 Spastic hemiplegia affecting unspecified side: Secondary | ICD-10-CM | POA: Diagnosis not present

## 2020-09-26 DIAGNOSIS — I25708 Atherosclerosis of coronary artery bypass graft(s), unspecified, with other forms of angina pectoris: Secondary | ICD-10-CM | POA: Diagnosis not present

## 2020-09-26 DIAGNOSIS — E785 Hyperlipidemia, unspecified: Secondary | ICD-10-CM | POA: Diagnosis not present

## 2020-09-26 DIAGNOSIS — H409 Unspecified glaucoma: Secondary | ICD-10-CM | POA: Diagnosis not present

## 2020-09-26 DIAGNOSIS — F419 Anxiety disorder, unspecified: Secondary | ICD-10-CM | POA: Diagnosis not present

## 2020-09-26 DIAGNOSIS — N400 Enlarged prostate without lower urinary tract symptoms: Secondary | ICD-10-CM | POA: Diagnosis not present

## 2020-09-26 DIAGNOSIS — I10 Essential (primary) hypertension: Secondary | ICD-10-CM | POA: Diagnosis not present

## 2020-09-26 DIAGNOSIS — K449 Diaphragmatic hernia without obstruction or gangrene: Secondary | ICD-10-CM | POA: Diagnosis not present

## 2020-09-26 DIAGNOSIS — M4802 Spinal stenosis, cervical region: Secondary | ICD-10-CM | POA: Diagnosis not present

## 2020-09-26 DIAGNOSIS — R413 Other amnesia: Secondary | ICD-10-CM | POA: Diagnosis not present

## 2020-09-26 DIAGNOSIS — E1142 Type 2 diabetes mellitus with diabetic polyneuropathy: Secondary | ICD-10-CM | POA: Diagnosis not present

## 2020-09-26 NOTE — Telephone Encounter (Signed)
Got a message from patients cardiologist that it would be okay to wean off of his beta blocker.  Had previously already told pt to f/u with him.  Just let him/daughter know that cardiology said okay to wean and he can call their office for further instructions

## 2020-09-27 ENCOUNTER — Telehealth: Payer: Self-pay | Admitting: Cardiology

## 2020-09-27 ENCOUNTER — Encounter: Payer: PPO | Admitting: Neurology

## 2020-09-27 NOTE — Telephone Encounter (Signed)
Pt c/o medication issue:  1. Name of Medication: propranolol (INDERAL) 40 MG tablet  2. How are you currently taking this medication (dosage and times per day)? As prescribed   3. Are you having a reaction (difficulty breathing--STAT)? No   4. What is your medication issue? Brendan [name] is calling stating they received a call from Brendan [name] requesting they reach out to discuss how Brendan [name] recommends Brendan [name] to decrease this medication to eventually stop taking it. She states it was originally prescribed to treat his trimers and regulate his BP which are both better at this time. Please advise.

## 2020-09-27 NOTE — Telephone Encounter (Signed)
Pt's daughter called stating Dr. Arbutus Leas is wanting to taper pt off propranolol but seeking instructions on how medication should be tapered down.  Will forward to MD for recommendations.

## 2020-09-27 NOTE — Telephone Encounter (Signed)
Patient notified and will contact cardiologist.

## 2020-09-28 NOTE — Telephone Encounter (Signed)
Attempted to contact daughter to provide MD's recommendations. Left message to call back.   Dr. Elissa Hefty note Need to wean it off.  Would switch to 1/2 tablet twice daily for a week, then 1/2 tablet daily for a week and then stop

## 2020-09-30 DIAGNOSIS — M5412 Radiculopathy, cervical region: Secondary | ICD-10-CM | POA: Diagnosis not present

## 2020-09-30 DIAGNOSIS — G992 Myelopathy in diseases classified elsewhere: Secondary | ICD-10-CM | POA: Diagnosis not present

## 2020-09-30 DIAGNOSIS — I10 Essential (primary) hypertension: Secondary | ICD-10-CM | POA: Diagnosis not present

## 2020-09-30 DIAGNOSIS — M4802 Spinal stenosis, cervical region: Secondary | ICD-10-CM | POA: Diagnosis not present

## 2020-10-01 ENCOUNTER — Telehealth: Payer: Self-pay | Admitting: *Deleted

## 2020-10-01 NOTE — Telephone Encounter (Signed)
   [facility] Pre-operative Risk Assessment    Patient Name: Brendan [name]  DOB: March 21, 1941  MRN: [id number]   Request for surgical clearance:  What type of surgery is being performed? C3-4 C4-5 Anterior cervical decompression/discectomy/fusion   When is this surgery scheduled? TBD   What type of clearance is required (medical clearance vs. Pharmacy clearance to hold med vs. Both)? Both   Are there any medications that need to be held prior to surgery and how long? ASA   Practice name and name of physician performing surgery? [facility] Dr. Vertell Limber   What is the office phone number? [phone]   7.   What is the office fax number? [phone] attn: Janett Billow  8.   Anesthesia type (None, local, MAC, general) ? general   Brendan [name] A Brendan [name] 10/01/2020, 5:00 PM  _________________________________________________________________   (provider comments below)

## 2020-10-04 NOTE — Telephone Encounter (Signed)
Attempted to contact daughter x 2. Left message to call back.

## 2020-10-04 NOTE — Telephone Encounter (Signed)
Hi Dr. Herbie Baltimore. Brendan [name] has upcoming spinal procedure planned (C3-C4 C4-C5 anterior cervical decompression) planned and will need to hold Aspirin. He has a history of CABG x3 in 2004 with 2/3 patent graft on last cardiac catheterization in 2013. Myoview in 2017 showed no ischemia. You last saw him in 05/2020 at which time he was not very active other than walking to the mail box and back but had no chest pain or shortness of breath with this. You did discuss with him possibly repeating a Myoview for surveillance but given patient was not having he symptoms he preferred to avoid this for now. Can you please comment on how long Aspirin can be held for upcoming spinal procedure?  Please route response back to P CV DIV PREOP.  Thank you! Kaicee Scarpino

## 2020-10-05 ENCOUNTER — Other Ambulatory Visit: Payer: Self-pay | Admitting: Family Medicine

## 2020-10-05 DIAGNOSIS — G629 Polyneuropathy, unspecified: Secondary | ICD-10-CM

## 2020-10-05 NOTE — Telephone Encounter (Signed)
Daughter returned call. Explained that I was calling to make sure patient was stable from a cardiac standpoint prior to upcoming surgery. Daughter is not currently with patient. She lives in [place]. She states patient is extremely hard of hearing and it will be very difficult to talk with him on the phone. However, she is coming into town tomorrow and will be with him. Therefore, we will try to call daughter tomorrow afternoon when she is with the patient so she can help [place] complete pre-op risk assessment. Daughter states she has to take her mother (patient's wife) to an appointment tomorrow afternoon at 2pm. I will send a message to APP covering pre-op tomorrow to see if we can call daughter around 3:30-4pm as she is not sure how long appointment will take. Daughter agreeable with plan and very thankful for our help.  Corrin Parker, PA-C 10/05/2020 3:37 PM

## 2020-10-05 NOTE — Telephone Encounter (Signed)
Left message on daughter Fermin Schwab number [phone]) which is the preferred number on file. OK per DPR. Asked patient to call back and ask to speak with pre-op team.  Corrin Parker, PA-C 10/05/2020 9:48 AM

## 2020-10-05 NOTE — Telephone Encounter (Signed)
CALLED LEFT MESSAGE TO CALL BACK ABOUT MEDICATION CHANGES  REQUESTED.

## 2020-10-06 NOTE — Telephone Encounter (Signed)
Dr. Herbie Baltimore, I was able to speak with Mr. Winegar and his daughter today. He is not having angina, but is unable to complete 4.0 METS. Last myoview in 2017, no recent ischemic eval.  It looks like you discussed a surveillance myoview at last visit 06/2020, but ultimately deferred due to lack of symptoms.  He now needs ACDF. Given that he is unable to complete 4.0 METS, I can't clear him using our typical protocol. Are you comfortable clearing him for surgery or would you prefer he complete a lexiscan myoview for risk stratification? It sounds like he is having symptoms/pain and would like to move forward with surgery ASAP. Any PCI at this point would further delay his surgery.  Thank you for your recs Angie

## 2020-10-11 NOTE — Telephone Encounter (Signed)
   Name: Brendan [name]  DOB: May 26, 1940  MRN: [id number]   Primary Cardiologist: Bryan Lemma, MD  Chart reviewed as part of pre-operative protocol coverage. Patient was contacted 10/11/2020 in reference to pre-operative risk assessment for pending surgery as outlined below.  Brendan [name] was last seen on 06/11/20 by Dr. Herbie [name].  Since that day, Brendan [name] has done well. He denies angina, but is unable to complete more than 4.0 METS. I reached out to Dr. Herbie [name] for further guidance: "...for this gentleman I am thinking that it is probably best for him just to go ahead and go forward with his procedure..."  OK to hold ASA for 7 days for spinal procedures.  Therefore, based on ACC/AHA guidelines, the patient would be at acceptable risk for the planned procedure without further cardiovascular testing.   The patient was advised that if he develops new symptoms prior to surgery to contact our office to arrange for a follow-up visit, and he verbalized understanding.  I will route this recommendation to the requesting party via Epic fax function and remove from pre-op pool. Please call with questions.  [name], PA 10/11/2020, 10:05 AM

## 2020-10-11 NOTE — Telephone Encounter (Signed)
I s/w pt's wife (DPR) pt has been cleared for upcoming spinal procedure with Dr. [name]. [name] has been informed the pt will need to hold ASA x 7 days prior to spinal procedure. I did also state that I saw ok to leave a message for their daughter [name] ([place]). Left detailed message for [name] pt has been cleared and he will need to hold ASA x 7 days prior to procedure. I will fax notes to surgeon's office.

## 2020-10-15 DIAGNOSIS — E1142 Type 2 diabetes mellitus with diabetic polyneuropathy: Secondary | ICD-10-CM | POA: Diagnosis not present

## 2020-10-15 DIAGNOSIS — E785 Hyperlipidemia, unspecified: Secondary | ICD-10-CM | POA: Diagnosis not present

## 2020-10-15 DIAGNOSIS — I25708 Atherosclerosis of coronary artery bypass graft(s), unspecified, with other forms of angina pectoris: Secondary | ICD-10-CM | POA: Diagnosis not present

## 2020-10-15 DIAGNOSIS — K219 Gastro-esophageal reflux disease without esophagitis: Secondary | ICD-10-CM | POA: Diagnosis not present

## 2020-10-15 DIAGNOSIS — I10 Essential (primary) hypertension: Secondary | ICD-10-CM | POA: Diagnosis not present

## 2020-10-15 DIAGNOSIS — G20 Parkinson's disease: Secondary | ICD-10-CM | POA: Diagnosis not present

## 2020-10-15 DIAGNOSIS — F419 Anxiety disorder, unspecified: Secondary | ICD-10-CM | POA: Diagnosis not present

## 2020-10-15 DIAGNOSIS — R413 Other amnesia: Secondary | ICD-10-CM | POA: Diagnosis not present

## 2020-10-15 DIAGNOSIS — N400 Enlarged prostate without lower urinary tract symptoms: Secondary | ICD-10-CM | POA: Diagnosis not present

## 2020-10-15 DIAGNOSIS — K449 Diaphragmatic hernia without obstruction or gangrene: Secondary | ICD-10-CM | POA: Diagnosis not present

## 2020-10-15 DIAGNOSIS — G8110 Spastic hemiplegia affecting unspecified side: Secondary | ICD-10-CM | POA: Diagnosis not present

## 2020-10-15 DIAGNOSIS — M4802 Spinal stenosis, cervical region: Secondary | ICD-10-CM | POA: Diagnosis not present

## 2020-10-15 DIAGNOSIS — H409 Unspecified glaucoma: Secondary | ICD-10-CM | POA: Diagnosis not present

## 2020-10-19 ENCOUNTER — Other Ambulatory Visit: Payer: Self-pay | Admitting: Neurosurgery

## 2020-11-01 DIAGNOSIS — M4802 Spinal stenosis, cervical region: Secondary | ICD-10-CM | POA: Diagnosis not present

## 2020-11-06 ENCOUNTER — Other Ambulatory Visit: Payer: Self-pay | Admitting: Family Medicine

## 2020-11-12 ENCOUNTER — Other Ambulatory Visit: Payer: Self-pay | Admitting: Family Medicine

## 2020-11-12 DIAGNOSIS — G629 Polyneuropathy, unspecified: Secondary | ICD-10-CM

## 2020-11-16 ENCOUNTER — Encounter (HOSPITAL_COMMUNITY): Payer: Self-pay

## 2020-11-16 ENCOUNTER — Other Ambulatory Visit: Payer: Self-pay

## 2020-11-16 ENCOUNTER — Encounter (HOSPITAL_COMMUNITY)
Admission: RE | Admit: 2020-11-16 | Discharge: 2020-11-16 | Disposition: A | Discharge status: Home / Self Care | Payer: PPO | Source: Ambulatory Visit | Attending: Neurosurgery | Admitting: Neurosurgery

## 2020-11-16 DIAGNOSIS — H409 Unspecified glaucoma: Secondary | ICD-10-CM | POA: Insufficient documentation

## 2020-11-16 DIAGNOSIS — Z951 Presence of aortocoronary bypass graft: Secondary | ICD-10-CM | POA: Diagnosis not present

## 2020-11-16 DIAGNOSIS — I10 Essential (primary) hypertension: Secondary | ICD-10-CM | POA: Diagnosis not present

## 2020-11-16 DIAGNOSIS — R7303 Prediabetes: Secondary | ICD-10-CM | POA: Diagnosis not present

## 2020-11-16 DIAGNOSIS — J439 Emphysema, unspecified: Secondary | ICD-10-CM | POA: Diagnosis not present

## 2020-11-16 DIAGNOSIS — E785 Hyperlipidemia, unspecified: Secondary | ICD-10-CM | POA: Diagnosis not present

## 2020-11-16 DIAGNOSIS — M4802 Spinal stenosis, cervical region: Secondary | ICD-10-CM | POA: Diagnosis not present

## 2020-11-16 DIAGNOSIS — G90511 Complex regional pain syndrome I of right upper limb: Secondary | ICD-10-CM | POA: Insufficient documentation

## 2020-11-16 DIAGNOSIS — J61 Pneumoconiosis due to asbestos and other mineral fibers: Secondary | ICD-10-CM | POA: Insufficient documentation

## 2020-11-16 DIAGNOSIS — G992 Myelopathy in diseases classified elsewhere: Secondary | ICD-10-CM | POA: Insufficient documentation

## 2020-11-16 DIAGNOSIS — Z79891 Long term (current) use of opiate analgesic: Secondary | ICD-10-CM | POA: Diagnosis not present

## 2020-11-16 DIAGNOSIS — I25118 Atherosclerotic heart disease of native coronary artery with other forms of angina pectoris: Secondary | ICD-10-CM | POA: Insufficient documentation

## 2020-11-16 DIAGNOSIS — Z79899 Other long term (current) drug therapy: Secondary | ICD-10-CM | POA: Insufficient documentation

## 2020-11-16 DIAGNOSIS — G629 Polyneuropathy, unspecified: Secondary | ICD-10-CM | POA: Insufficient documentation

## 2020-11-16 DIAGNOSIS — R413 Other amnesia: Secondary | ICD-10-CM | POA: Diagnosis not present

## 2020-11-16 DIAGNOSIS — Z955 Presence of coronary angioplasty implant and graft: Secondary | ICD-10-CM | POA: Diagnosis not present

## 2020-11-16 DIAGNOSIS — Z01812 Encounter for preprocedural laboratory examination: Secondary | ICD-10-CM | POA: Diagnosis not present

## 2020-11-16 DIAGNOSIS — Z7982 Long term (current) use of aspirin: Secondary | ICD-10-CM | POA: Insufficient documentation

## 2020-11-16 DIAGNOSIS — I700 Atherosclerosis of aorta: Secondary | ICD-10-CM | POA: Insufficient documentation

## 2020-11-16 HISTORY — DX: Prediabetes: R73.03

## 2020-11-16 LAB — TYPE AND SCREEN
ABO/RH(D): B POS
Antibody Screen: NEGATIVE

## 2020-11-16 LAB — BASIC METABOLIC PANEL
Anion gap: 6 (ref 5–15)
BUN: 7 mg/dL — ABNORMAL LOW (ref 8–23)
CO2: 28 mmol/L (ref 22–32)
Calcium: 9.2 mg/dL (ref 8.9–10.3)
Chloride: 104 mmol/L (ref 98–111)
Creatinine, Ser: 1.11 mg/dL (ref 0.61–1.24)
GFR, Estimated: >60 mL/min (ref >60)
Glucose, Bld: 180 mg/dL — ABNORMAL HIGH (ref 70–99)
Potassium: 3.8 mmol/L (ref 3.5–5.1)
Sodium: 138 mmol/L (ref 135–145)

## 2020-11-16 LAB — CBC
HCT: 39.5 % (ref 39.0–52.0)
Hemoglobin: 13.3 g/dL (ref 13.0–17.0)
MCH: 32.1 pg (ref 26.0–34.0)
MCHC: 33.7 g/dL (ref 30.0–36.0)
MCV: 95.4 fL (ref 80.0–100.0)
Platelets: 158 10*3/uL (ref 150–400)
RBC: 4.14 MIL/uL — ABNORMAL LOW (ref 4.22–5.81)
RDW: 13 % (ref 11.5–15.5)
WBC: 5.6 10*3/uL (ref 4.0–10.5)
nRBC: 0 % (ref 0.0–0.2)

## 2020-11-16 LAB — SURGICAL PCR SCREEN
MRSA, PCR: NEGATIVE
Staphylococcus aureus: POSITIVE — AB

## 2020-11-16 NOTE — Progress Notes (Signed)
Surgical Instructions    Your procedure is scheduled on 11/30/20.  Report to [facility] Main Entrance "A" at 9:20 A.M., then check in with the Admitting office.  Call this number if you have problems the morning of surgery:  [phone]   If you have any questions prior to your surgery date call [phone]: Open Monday-Friday 8am-4pm    Remember:  Do not eat after midnight the night before your surgery  You may drink clear liquids until 8:20 the morning of your surgery.   Clear liquids allowed are: Water, Non-Citrus Juices (without pulp), Carbonated Beverages, Clear Tea, Black Coffee Only, and Gatorade    Take these medicines the morning of surgery with A SIP OF WATER:   ALLERGY RELIEF  atorvastatin (LIPITOR)  fluticasone (FLONASE)  propranolol (INDERAL)   As Needed: albuterol (PROAIR HFA) methocarbamol (ROBAXIN) nitroGLYCERIN (NITROSTAT) omeprazole (PRILOSEC)   As of today, STOP taking any Aspirin (unless otherwise instructed by your surgeon) Aleve, Naproxen, Ibuprofen, Motrin, Advil, Goody's, BC's, all herbal medications, fish oil, and all vitamins.          Do not wear jewelry  Do not wear lotions, powders, colognes, or deodorant. Do not shave 48 hours prior to surgery.  Men may shave face and neck. Do not bring valuables to the hospital.              [facility] is not responsible for any belongings or valuables.  Do NOT Smoke (Tobacco/Vaping) or drink Alcohol 24 hours prior to your procedure If you use a CPAP at night, you may bring all equipment for your overnight stay.   Contacts, glasses, dentures or bridgework may not be worn into surgery, please bring cases for these belongings   For patients admitted to the hospital, discharge time will be determined by your treatment team.   Patients discharged the day of surgery will not be allowed to drive home, and someone needs to stay with them for 24 hours.  ONLY 1 SUPPORT PERSON MAY BE PRESENT WHILE YOU ARE IN  SURGERY. IF YOU ARE TO BE ADMITTED ONCE YOU ARE IN YOUR ROOM YOU WILL BE ALLOWED TWO (2) VISITORS.  Minor children may have two parents present. Special consideration for safety and communication needs will be reviewed on a case by case basis.  Special instructions:    Oral Hygiene is also important to reduce your risk of infection.  Remember - BRUSH YOUR TEETH THE MORNING OF SURGERY WITH YOUR REGULAR TOOTHPASTE   [facility]- Preparing For Surgery  Before surgery, you can play an important role. Because skin is not sterile, your skin needs to be as free of germs as possible. You can reduce the number of germs on your skin by washing with CHG (chlorahexidine gluconate) Soap before surgery.  CHG is an antiseptic cleaner which kills germs and bonds with the skin to continue killing germs even after washing.     Please do not use if you have an allergy to CHG or antibacterial soaps. If your skin becomes reddened/irritated stop using the CHG.  Do not shave (including legs and underarms) for at least 48 hours prior to first CHG shower. It is OK to shave your face.  Please follow these instructions carefully.     Shower the NIGHT BEFORE SURGERY and the MORNING OF SURGERY with CHG Soap.   If you chose to wash your hair, wash your hair first as usual with your normal shampoo. After you shampoo, rinse your hair and body thoroughly  to remove the shampoo.  Then [employer] and genitals (private parts) with your normal soap and rinse thoroughly to remove soap.  After that Use CHG Soap as you would any other liquid soap. You can apply CHG directly to the skin and wash gently with a scrungie or a clean washcloth.   Apply the CHG Soap to your body ONLY FROM THE NECK DOWN.  Do not use on open wounds or open sores. Avoid contact with your eyes, ears, mouth and genitals (private parts). Wash Face and genitals (private parts)  with your normal soap.   Wash thoroughly, paying special attention to the area where  your surgery will be performed.  Thoroughly rinse your body with warm water from the neck down.  DO NOT shower/wash with your normal soap after using and rinsing off the CHG Soap.  Pat yourself dry with a CLEAN TOWEL.  Wear CLEAN PAJAMAS to bed the night before surgery  Place CLEAN SHEETS on your bed the night before your surgery  DO NOT SLEEP WITH PETS.   Day of Surgery:  Take a shower with CHG soap. Wear Clean/Comfortable clothing the morning of surgery Do not apply any deodorants/lotions.   Remember to brush your teeth WITH YOUR REGULAR TOOTHPASTE.   Please read over the following fact sheets that you were given.

## 2020-11-16 NOTE — Progress Notes (Signed)
PCP: Lavonda Jumbo, Md Cardiologist: Bryan Lemma, MD  EKG: 06/11/20 CXR: 08/29/17 ECHO: 05/16/17 Stress Test: 09/28/15 Cardiac Cath: 10/13/11  Fasting Blood Sugar- does not check BG's.  Pre-diabetic Checks Blood Sugar__0_ times a day  OSA/CPAP: No  ASA: Last dose 11/23/20 Blood Thinner: No  Covid test 8/12 or 8/15.  Instructions, map and requisition given to patient and daughter  Anesthesia Review: Yes, cardiac history.  Abnormal EKG. Patient has brain stimulator and per daughter [employer] needs to turn off prior to surgery.  Called Dr. Fredrich Birks office and left message for CMA.  Spoke to [employer], [place] and he will follow up.  Patient will also be contacting Dr. [name].  Patient denies shortness of breath, fever, cough, and chest pain at PAT appointment.  Patient verbalized understanding of instructions provided today at the PAT appointment.  Patient asked to review instructions at home and day of surgery.

## 2020-11-18 NOTE — Anesthesia Preprocedure Evaluation (Addendum)
Anesthesia Evaluation  Patient identified by MRN, date of birth, ID band Patient awake    Reviewed: Allergy & Precautions, NPO status , Patient's Chart, lab work & pertinent test results  Airway Mallampati: II  TM Distance: >3 FB Neck ROM: Full    Dental no notable dental hx.    Pulmonary COPD, former smoker,    Pulmonary exam normal breath sounds clear to auscultation       Cardiovascular hypertension, + CAD  Normal cardiovascular exam Rhythm:Regular Rate:Normal     Neuro/Psych PSYCHIATRIC DISORDERS Anxiety Essential tremor Deep brain stimulator  Neuromuscular disease (CRPS right upper extremity)    GI/Hepatic GERD  ,  Endo/Other  diabetes  Renal/GU   negative genitourinary   Musculoskeletal negative musculoskeletal ROS (+)   Abdominal   Peds negative pediatric ROS (+)  Hematology negative hematology ROS (+)   Anesthesia Other Findings   Reproductive/Obstetrics negative OB ROS                          Anesthesia Physical Anesthesia Plan  ASA: 3  Anesthesia Plan: General   Post-op Pain Management:    Induction: Intravenous  PONV Risk Score and Plan: 2 and Treatment may vary due to age or medical condition and Ondansetron  Airway Management Planned: Oral ETT and Video Laryngoscope Planned  Additional Equipment: None  Intra-op Plan:   Post-operative Plan: Extubation in OR  Informed Consent: I have reviewed the patients History and Physical, chart, labs and discussed the procedure including the risks, benefits and alternatives for the proposed anesthesia with the patient or authorized representative who has indicated his/her understanding and acceptance.     Dental advisory given  Plan Discussed with: CRNA  Anesthesia Plan Comments: (PAT note written 11/18/2020 by Shonna Chock, PA-C.  He has a [employer] deep brain stimulator. Dr. Fredrich Birks office contacted regarding  perioperative instructions/management. See APP note. )      Anesthesia Quick Evaluation

## 2020-11-18 NOTE — Progress Notes (Signed)
Anesthesia Chart Review:  Case: [id number] Date/Time: 11/30/20 1108   Procedure: Cervical 3-4, Cervical 4-5 Anterior cervical decompression/discectomy/fusion - 3C/RM 21   Anesthesia type: General   Pre-op diagnosis: Stenosis of cervical spine with myelopathy   [facility]: [facility] ROOM [facility] / [facility]   Surgeons: Maeola Harman, MD       DISCUSSION: Patient is a 80 year old [name] scheduled for the above procedure.  History includes former smoker (quit 04/17/68), COPD, asbestosis, HTN, HLD, CAD (CABG x 3: LIMA-LAD, SVG-DIAG, SVG-OM2 06/30/02;  10/13/11 [facility] for unstable angina - Occluded SVG-Diag w/Patent LIMA-LAD & SVG-OM, no culprit lesion, treated medically; He does have chronic stable angina), pre-diabetes, anemia, GERD, hiatal hernia, essential tremor (s/p bilateral deep brain stimulator insertion 05/04/2017 c/b postop ICH causing right hemiparesis, confusion, & dysphagia requiring rehab stay with improvement; s/p  bilateral implantable pulse generator 11/13/17), CRPS of RUE, peripheral neuropathy, mild memory loss, gait disorder, glaucoma.   Preoperative cardiology input outlined by Micah Flesher, PA, "Patient was contacted 10/11/2020 in reference to pre-operative risk assessment for pending surgery as outlined below.  Brendan [name] was last seen on 06/11/20 by Dr. Herbie Baltimore.  Since that day, [name] has done well. He denies angina, but is unable to complete more than 4.0 METS. I reached out to Dr. Herbie Baltimore for further guidance: '...for this gentleman I am thinking that it is probably best for him just to go ahead and go forward with his procedure...'   OK to hold ASA for 7 days for spinal procedures.   Therefore, based on ACC/AHA guidelines, the patient would be at acceptable risk for the planned procedure without further cardiovascular testing."  Last ASA planned for 11/23/20.  He is for preoperative COVID-19 testing on November 26, 2020 or November 29, 2020.  Anesthesia team to evaluate on the day of surgery.   As above, he does have a [employer] deep brain stimulator that was placed by Dr. [name] a few years ago. Daughter reported that it will need to be turned off prior to surgery, and she or patient plan to follow-up with Dr. [name] about this. I also spoke with Erie Noe at Dr. Fredrich Birks office about this. It may be that patient or surgeon has available devise programmer, but if not asked that they contact [employer] rep if needed.    VS: BP (!) 167/76   Pulse 88   Temp 36.7 C   Resp 18   Ht 5\' 6"  (1.676 m)   Wt 86.1 kg   SpO2 98%   BMI 30.65 kg/m    PROVIDERS: , DO is PCP [name]) - [name], MD is cardiologist. Last visit 06/11/20. Known 2 of 3 patent bypass grafts. He notes patient may have mild stable angina that is well controlled and not requiring Nitro--and more mobile than before. Continued losartan, propranolol (on this b-blocker due to tremor), ASA.  He was off ACEI/ARB due to concerns of orthostatic hypotension. (09/26/20 note by Dr. 11/26/20 indicated that she received cardiologist's okay to wean off b-blocker if needed for dizziness.) Arbutus Leas, MD is pulmonologist. Last visit 04/01/20.  COPD was stable.  Patient wanted to hold off on starting long-acting bronchodilator.  CT in 1 year to follow asbestosis. 04/03/20, DO is neurologist. Last visit 09/20/20.   LABS: Labs reviewed: Acceptable for surgery. A1c 6.9% 06/22/20.  (all labs ordered are listed, but only abnormal results are displayed)  Labs Reviewed  SURGICAL PCR SCREEN - Abnormal; Notable for  the following components:      Result Value   Staphylococcus aureus POSITIVE (*)    All other components within normal limits  CBC - Abnormal; Notable for the following components:   RBC 4.14 (*)    All other components within normal limits  BASIC METABOLIC PANEL - Abnormal; Notable for the following components:   Glucose, Bld 180 (*)    BUN 7 (*)    All other components within normal  limits  TYPE AND SCREEN    PFTs 04/01/20: FVC 2.34 (70%), post 2.56 (76%). FEV1 1.70 (67%), post 1.90 (Brendan%), DLCO unc/cor 21.25 (83%). "Moderately severe obstruction with a positive bronchodilator response, coexisting restriction.  He had a difficult time doing lung volumes but the suggest restriction as well.  Diffusion capacity normal.  His FEV1 is 1.70 L stable compared with prior PFT 05/15/2012."   IMAGES: MRI C-spine 09/07/20: IMPRESSION: - Degenerative spondylosis from C3-4 through C6-7 with central canal stenosis. This is most severe at C3-4 where the AP diameter of the canal is only 4.3 mm. Diameter C4-5 is 5.4 mm. Diameter at C5-6 is 8.2 mm. Diameter at C6-7 is 7.5 mm. There is cord deformity, more at C3-4 than C4-5. Question early abnormal T2 signal affecting the cord at C3-4. Findings in general have worsened somewhat since 2013. - Bilateral foraminal stenosis at those same levels could also affect the exiting nerve roots.  MRI Brain: IMPRESSION: 1. No evidence of acute intracranial abnormality. 2. Sequela of prior hemorrhage in the left frontal lobe. 3. Bilateral deep brain stimulator electrodes. 4. Mild for age chronic microvascular ischemic disease and moderate atrophy.   CT Chest 02/17/20: IMPRESSION: - Stable calcified pleural plaques and chronic trace left pleural effusion/thickening, compatible with asbestos related pleural disease. - Aortic Atherosclerosis (ICD10-I70.0) and Emphysema (ICD10-J43.9).   EKG: EKG 06/11/20 ([facility]): Per Dr. Herbie BaltimoreHarding: Rate: 63 ;  Rhythm: normal sinus rhythm and LAFB.  ST-T wave changes concerning for repolarization versus lateral ischemia.;  Otherwise normal intervals and durations. Narrative Interpretation: Stable - There is baseline interference likely from his deep brain stimulator, and also baseline wander particularly in leads V4-6.   CV: Echo 05/16/2017: Study Conclusions - Left ventricle: The cavity size was normal.  Systolic function was   normal. The estimated ejection fraction was in the range of 55%   to 60%. Wall motion was normal; there were no regional wall   motion abnormalities. Left ventricular diastolic function   parameters were normal. - Left atrium: The atrium was mildly dilated. - Atrial septum: No defect or patent foramen ovale was identified.    Lexiscan 09/28/2015: The left ventricular ejection fraction is normal (55-65%). No wall motion abnormalities. Nuclear stress EF: 59%. There was no ST segment deviation noted during stress. The study is normal. There is no ischemia identified. Normal perfusion. This is a low risk study.    Cath 10/13/2011: Left Ventriculography:  EF: 40-45% Wall Motion: Global hypokinesis with mid to apical inferior hypokinesis Coronary Angiographic Data:  Left Main: Large caliber vessel, bifurcates into the LAD and Left Circumflex with a very small Ramus Intermedius. Left Anterior Descending (LAD): Large caliber vessel with 2 small caliber proximal Diagonal branches, the native vessel then tapers off with to-and-fro flow from the LIMA graft. The distal LAD fills via the LIMA with minimal luminal irregularities.  The grafted Diagonal was not visualized.  Circumflex (LCx): Large caliber vessel, 2 small OMBs (not numbered) then bifurcates into the main OM tunck and the AV Groove Circumflex.  1st obtuse marginal: This is actually the small caliber superior branch of the OM trunk, angiographically normal. 2nd obtuse marginal: Moderate to small caliber inferior branch of the OM Trunk - to & fro flow from the SVG. posterior lateral branch: The remainder of the AVGroove Circumflex courses around to give off a moderate caliber LPL. Angiographyically normal. Right Coronary Artery: Moderate sized dominant vessel, SA nodal and atrial branches followed by 2 RV marginal branches. Bifurcates distally into RPDA and RPAV. Minimal luminal irregularities posterior descending  artery: Small to moderate caliber vessel, reaches ~2/3 of the way to the apex. Minimal luminal irregularities. Posterior-atrioventricular branch: Small caliber vessel that gives off the AV Nodal artery as well as 4 small Posterolateral branches. Grafts  LIMA - LAD: Widely patent  SVG - DIAG: Unable to find Aorto-anastomosis with direct or indirect engagement  SVG - OM: Widely patent, antegrade flow fills large bifurcating OM, retrograde flow to the native Left Circumflex fills OM1 then down the native AV Groove Cx to the LPL branches.   Impression: Widely patent LIMA & SVG-OM but unidentifiable SVG-Diag. EF 45% with apical inferior hypokinesis. Cardiomyopathy - presumed to be Ischemic, but may be partially non-ischemic. Normal RCA. No culprit lesion besides the missing SVG-Diag   Plan: Optimize Medical Therapy  Past Medical History:  Diagnosis Date   Anxiety disorder    Benign enlargement of prostate    CAD in native artery 1997   Referred for CABG x3 in 2004 for LAD diagonal bifurcation lesion   Cervical spinal stenosis    Complex regional pain syndrome of right upper extremity    Right wrist; L arm   Coronary atherosclerosis of artery bypass graft 09/2011   Occluded SVG-D1; Cardiologist Dr. Herbie Baltimore   Gait disorder    Gastroesophageal reflux disease    Glaucoma    Hiatal hernia    GI: Dr Evette Cristal   Hyperlipidemia LDL goal <70    Hypertension    Memory loss    Mild   Obesity    Peripheral neuropathy    possible peripheral neuropathy   Pre-diabetes    S/P CABG x 3 2004    LIMA-LAD, SVG to diagonal, SVG to OM   Tremor, essential    On Primidone   Unstable angina pectoris (HCC) 09/2011   Cardiac cath: occluded SVG-DI. Patent LIMA-LAD and SVG-OM. EF 45% with apical inferior HK.    Past Surgical History:  Procedure Laterality Date   CATARACT EXTRACTION, BILATERAL     CORONARY ARTERY BYPASS GRAFT  06/2002   LIMA-LAD, SVG-D1, SVG-OM   EYE SURGERY     KNEE SURGERY Left     LEFT HEART CATH AND CORONARY ANGIOGRAPHY  1997; 2004   In 2004 was referred for CABG due to a LAD-bifurcation lesion   LEFT HEART CATHETERIZATION WITH CORONARY/GRAFT ANGIOGRAM N/A 10/13/2011   Procedure: LEFT HEART CATHETERIZATION WITH Isabel Caprice;  Surgeon: Marykay Lex, MD;  Location: [facility] CATH LAB;;  occluded SVG-DI. Patent LIMA-LAD and SVG-OM. EF 45% with apical inferior HK.;   MINOR PLACEMENT OF FIDUCIAL N/A 04/26/2017   Procedure: Fiducial placement;  Surgeon: Maeola Harman, MD;  Location: [facility] OR;  Service: Neurosurgery;  Laterality: N/A;  Fiducial placement   NM MYOCAR PERF WALL MOTION  03/23/2009   protocol:Bruce, normal perfusion in all regions, post-stress EF 72%, exercise capacity , EKG negative for ischemia.   PULSE GENERATOR IMPLANT Bilateral 11/13/2017   Procedure: Bilateral Implantable pulse generator;  Surgeon: Maeola Harman, MD;  Location: [facility] OR;  Service: Neurosurgery;  Laterality: Bilateral;  Bilateral Implantable pulse generator   Shoulder Orthoscopic Surgery   06/2003   SUBTHALAMIC STIMULATOR INSERTION Bilateral 05/04/2017   Procedure: Bilateral Deep brain stimulator placement;  Surgeon: Maeola Harman, MD;  Location: [facility] OR;  Service: Neurosurgery;  Laterality: Bilateral;  Bilateral deep brain stimulator placement   TRANSTHORACIC ECHOCARDIOGRAM  04/2017   Jan 2019: Normal LV size and function.  EF 55-60%.  No or W MA.  Mild LA dilation.   wrist ganglion cyst Left     MEDICATIONS:  albuterol (PROAIR HFA) 108 (90 Base) MCG/ACT inhaler   aspirin EC 81 MG tablet   atorvastatin (LIPITOR) 40 MG tablet   Cyanocobalamin (VITAMIN B 12) 500 MCG TABS   fluticasone (FLONASE) 50 MCG/ACT nasal spray   furosemide (LASIX) 20 MG tablet   loratadine (CLARITIN) 10 MG tablet   Melatonin 3 MG TABS   methocarbamol (ROBAXIN) 750 MG tablet   nitroGLYCERIN (NITROSTAT) 0.4 MG SL tablet   omeprazole (PRILOSEC) 20 MG capsule   traMADol (ULTRAM) 50 MG tablet   traZODone (DESYREL)  100 MG tablet   propranolol (INDERAL) 40 MG tablet   No current facility-administered medications for this encounter.    0.9 %  sodium chloride infusion   sodium chloride 0.9 % injection 3 mL    Shonna Chock, PA-C Surgical Short Stay/Anesthesiology [facility] Phone [phone] [facility] Phone [phone] 11/18/2020 1:44 PM

## 2020-11-26 ENCOUNTER — Other Ambulatory Visit: Payer: Self-pay | Admitting: Neurosurgery

## 2020-11-27 LAB — SARS CORONAVIRUS 2 (TAT 6-24 HRS): SARS Coronavirus 2: NEGATIVE

## 2020-11-29 ENCOUNTER — Other Ambulatory Visit: Payer: Self-pay | Admitting: Family Medicine

## 2020-11-30 ENCOUNTER — Encounter (HOSPITAL_COMMUNITY)
Admission: RE | Disposition: A | Discharge status: Home / Self Care | Payer: Self-pay | Source: Home / Self Care | Attending: Neurosurgery

## 2020-11-30 ENCOUNTER — Ambulatory Visit (HOSPITAL_COMMUNITY): Payer: PPO | Admitting: Certified Registered Nurse Anesthetist

## 2020-11-30 ENCOUNTER — Encounter (HOSPITAL_COMMUNITY): Payer: Self-pay | Admitting: Neurosurgery

## 2020-11-30 ENCOUNTER — Ambulatory Visit (HOSPITAL_COMMUNITY): Payer: PPO

## 2020-11-30 ENCOUNTER — Other Ambulatory Visit: Payer: Self-pay

## 2020-11-30 ENCOUNTER — Ambulatory Visit (HOSPITAL_COMMUNITY): Payer: PPO | Admitting: Physician Assistant

## 2020-11-30 ENCOUNTER — Observation Stay (HOSPITAL_COMMUNITY)
Admission: RE | Admit: 2020-11-30 | Discharge: 2020-12-01 | Disposition: A | Discharge status: Home / Self Care | Payer: PPO | Attending: Neurosurgery | Admitting: Neurosurgery

## 2020-11-30 DIAGNOSIS — M50021 Cervical disc disorder at C4-C5 level with myelopathy: Secondary | ICD-10-CM | POA: Insufficient documentation

## 2020-11-30 DIAGNOSIS — M4322 Fusion of spine, cervical region: Secondary | ICD-10-CM | POA: Diagnosis not present

## 2020-11-30 DIAGNOSIS — G959 Disease of spinal cord, unspecified: Secondary | ICD-10-CM | POA: Diagnosis present

## 2020-11-30 DIAGNOSIS — Z7982 Long term (current) use of aspirin: Secondary | ICD-10-CM | POA: Diagnosis not present

## 2020-11-30 DIAGNOSIS — M5000 Cervical disc disorder with myelopathy, unspecified cervical region: Secondary | ICD-10-CM | POA: Diagnosis not present

## 2020-11-30 DIAGNOSIS — Z79899 Other long term (current) drug therapy: Secondary | ICD-10-CM | POA: Diagnosis not present

## 2020-11-30 DIAGNOSIS — M50121 Cervical disc disorder at C4-C5 level with radiculopathy: Secondary | ICD-10-CM | POA: Diagnosis not present

## 2020-11-30 DIAGNOSIS — Z419 Encounter for procedure for purposes other than remedying health state, unspecified: Secondary | ICD-10-CM

## 2020-11-30 DIAGNOSIS — M5011 Cervical disc disorder with radiculopathy,  high cervical region: Secondary | ICD-10-CM | POA: Diagnosis not present

## 2020-11-30 DIAGNOSIS — M5010 Cervical disc disorder with radiculopathy, unspecified cervical region: Secondary | ICD-10-CM | POA: Diagnosis not present

## 2020-11-30 DIAGNOSIS — Q765 Cervical rib: Secondary | ICD-10-CM | POA: Diagnosis not present

## 2020-11-30 DIAGNOSIS — I10 Essential (primary) hypertension: Secondary | ICD-10-CM | POA: Insufficient documentation

## 2020-11-30 DIAGNOSIS — M4802 Spinal stenosis, cervical region: Secondary | ICD-10-CM | POA: Insufficient documentation

## 2020-11-30 DIAGNOSIS — Z981 Arthrodesis status: Secondary | ICD-10-CM | POA: Diagnosis not present

## 2020-11-30 DIAGNOSIS — M5001 Cervical disc disorder with myelopathy,  high cervical region: Secondary | ICD-10-CM | POA: Diagnosis not present

## 2020-11-30 HISTORY — PX: ANTERIOR CERVICAL DECOMP/DISCECTOMY FUSION: SHX1161

## 2020-11-30 LAB — GLUCOSE, CAPILLARY: Glucose-Capillary: 227 mg/dL — ABNORMAL HIGH (ref 70–99)

## 2020-11-30 SURGERY — ANTERIOR CERVICAL DECOMPRESSION/DISCECTOMY FUSION 2 LEVELS
Anesthesia: General

## 2020-11-30 MED ORDER — ACETAMINOPHEN 500 MG PO TABS
1000.0000 mg | ORAL_TABLET | Freq: Once | ORAL | Status: AC
  Administered 2020-11-30: 1000 mg via ORAL
  Filled 2020-11-30: qty 2

## 2020-11-30 MED ORDER — FENTANYL CITRATE (PF) 250 MCG/5ML IJ SOLN
INTRAMUSCULAR | Status: AC
  Filled 2020-11-30: qty 5

## 2020-11-30 MED ORDER — MELATONIN 3 MG PO TABS
3.0000 mg | ORAL_TABLET | Freq: Every day | ORAL | Status: DC
  Administered 2020-11-30: 3 mg via ORAL
  Filled 2020-11-30: qty 1

## 2020-11-30 MED ORDER — PROPRANOLOL HCL 40 MG PO TABS
40.0000 mg | ORAL_TABLET | Freq: Two times a day (BID) | ORAL | Status: DC
  Administered 2020-11-30: 40 mg via ORAL
  Filled 2020-11-30 (×3): qty 1

## 2020-11-30 MED ORDER — TAMSULOSIN HCL 0.4 MG PO CAPS
0.4000 mg | ORAL_CAPSULE | Freq: Every day | ORAL | Status: DC
  Administered 2020-11-30 – 2020-12-01 (×2): 0.4 mg via ORAL
  Filled 2020-11-30 (×2): qty 1

## 2020-11-30 MED ORDER — LIDOCAINE 2% (20 MG/ML) 5 ML SYRINGE
INTRAMUSCULAR | Status: AC
  Filled 2020-11-30: qty 5

## 2020-11-30 MED ORDER — PANTOPRAZOLE SODIUM 40 MG IV SOLR: 40.0000 mg | Freq: Every day | INTRAVENOUS | Status: DC

## 2020-11-30 MED ORDER — EPHEDRINE SULFATE-NACL 50-0.9 MG/10ML-% IV SOSY
PREFILLED_SYRINGE | INTRAVENOUS | Status: DC | PRN
  Administered 2020-11-30 (×2): 5 mg via INTRAVENOUS

## 2020-11-30 MED ORDER — FENTANYL CITRATE (PF) 100 MCG/2ML IJ SOLN
25.0000 ug | INTRAMUSCULAR | Status: DC | PRN
  Administered 2020-11-30: 25 ug via INTRAVENOUS

## 2020-11-30 MED ORDER — FUROSEMIDE 20 MG PO TABS: 20.0000 mg | ORAL_TABLET | Freq: Every day | ORAL | Status: DC | PRN

## 2020-11-30 MED ORDER — TRAZODONE HCL 100 MG PO TABS
100.0000 mg | ORAL_TABLET | Freq: Every day | ORAL | Status: DC
  Administered 2020-11-30: 100 mg via ORAL
  Filled 2020-11-30 (×2): qty 1

## 2020-11-30 MED ORDER — ALBUTEROL SULFATE (2.5 MG/3ML) 0.083% IN NEBU: 2.5000 mg | INHALATION_SOLUTION | Freq: Four times a day (QID) | RESPIRATORY_TRACT | Status: DC | PRN

## 2020-11-30 MED ORDER — ALBUTEROL SULFATE HFA 108 (90 BASE) MCG/ACT IN AERS
INHALATION_SPRAY | RESPIRATORY_TRACT | Status: DC | PRN
  Administered 2020-11-30: 2 via RESPIRATORY_TRACT

## 2020-11-30 MED ORDER — ROCURONIUM BROMIDE 10 MG/ML (PF) SYRINGE
PREFILLED_SYRINGE | INTRAVENOUS | Status: DC | PRN
  Administered 2020-11-30: 70 mg via INTRAVENOUS

## 2020-11-30 MED ORDER — GLYCOPYRROLATE PF 0.2 MG/ML IJ SOSY
PREFILLED_SYRINGE | INTRAMUSCULAR | Status: DC | PRN
  Administered 2020-11-30: .1 mg via INTRAVENOUS

## 2020-11-30 MED ORDER — SODIUM CHLORIDE 0.9 % IV SOLN
250.0000 mL | INTRAVENOUS | Status: DC
  Administered 2020-11-30: 250 mL via INTRAVENOUS

## 2020-11-30 MED ORDER — THROMBIN 5000 UNITS EX SOLR
CUTANEOUS | Status: AC
  Filled 2020-11-30: qty 5000

## 2020-11-30 MED ORDER — ACETAMINOPHEN 325 MG PO TABS: 650.0000 mg | ORAL_TABLET | ORAL | Status: DC | PRN

## 2020-11-30 MED ORDER — ONDANSETRON HCL 4 MG/2ML IJ SOLN: 4.0000 mg | Freq: Once | INTRAMUSCULAR | Status: DC | PRN

## 2020-11-30 MED ORDER — CEFAZOLIN SODIUM-DEXTROSE 2-4 GM/100ML-% IV SOLN
2.0000 g | Freq: Three times a day (TID) | INTRAVENOUS | Status: AC
  Administered 2020-11-30 – 2020-12-01 (×2): 2 g via INTRAVENOUS
  Filled 2020-11-30 (×2): qty 100

## 2020-11-30 MED ORDER — OXYCODONE HCL 5 MG PO TABS
5.0000 mg | ORAL_TABLET | Freq: Once | ORAL | Status: AC | PRN
  Administered 2020-11-30: 5 mg via ORAL

## 2020-11-30 MED ORDER — FLUTICASONE PROPIONATE 50 MCG/ACT NA SUSP
2.0000 | Freq: Every morning | NASAL | Status: DC
  Administered 2020-12-01: 2 via NASAL
  Filled 2020-11-30: qty 16

## 2020-11-30 MED ORDER — DEXAMETHASONE SODIUM PHOSPHATE 10 MG/ML IJ SOLN
INTRAMUSCULAR | Status: AC
  Filled 2020-11-30: qty 1

## 2020-11-30 MED ORDER — LACTATED RINGERS IV SOLN: INTRAVENOUS | Status: DC

## 2020-11-30 MED ORDER — PANTOPRAZOLE SODIUM 40 MG PO TBEC: 40.0000 mg | DELAYED_RELEASE_TABLET | Freq: Every day | ORAL | Status: DC

## 2020-11-30 MED ORDER — ONDANSETRON HCL 4 MG/2ML IJ SOLN: 4.0000 mg | Freq: Four times a day (QID) | INTRAMUSCULAR | Status: DC | PRN

## 2020-11-30 MED ORDER — ALBUTEROL SULFATE HFA 108 (90 BASE) MCG/ACT IN AERS
INHALATION_SPRAY | RESPIRATORY_TRACT | Status: AC
  Filled 2020-11-30: qty 6.7

## 2020-11-30 MED ORDER — CHLORHEXIDINE GLUCONATE CLOTH 2 % EX PADS: 6.0000 | MEDICATED_PAD | Freq: Once | CUTANEOUS | Status: DC

## 2020-11-30 MED ORDER — FENTANYL CITRATE (PF) 100 MCG/2ML IJ SOLN
INTRAMUSCULAR | Status: AC
  Filled 2020-11-30: qty 2

## 2020-11-30 MED ORDER — ZOLPIDEM TARTRATE 5 MG PO TABS: 5.0000 mg | ORAL_TABLET | Freq: Every evening | ORAL | Status: DC | PRN

## 2020-11-30 MED ORDER — ONDANSETRON HCL 4 MG PO TABS: 4.0000 mg | ORAL_TABLET | Freq: Four times a day (QID) | ORAL | Status: DC | PRN

## 2020-11-30 MED ORDER — KCL IN DEXTROSE-NACL 20-5-0.45 MEQ/L-%-% IV SOLN: INTRAVENOUS | Status: DC

## 2020-11-30 MED ORDER — SUGAMMADEX SODIUM 200 MG/2ML IV SOLN
INTRAVENOUS | Status: DC | PRN
  Administered 2020-11-30: 200 mg via INTRAVENOUS

## 2020-11-30 MED ORDER — FENTANYL CITRATE (PF) 100 MCG/2ML IJ SOLN
INTRAMUSCULAR | Status: DC | PRN
  Administered 2020-11-30 (×2): 25 ug via INTRAVENOUS

## 2020-11-30 MED ORDER — SODIUM CHLORIDE 0.9% FLUSH: 3.0000 mL | INTRAVENOUS | Status: DC | PRN

## 2020-11-30 MED ORDER — CHLORHEXIDINE GLUCONATE 0.12 % MT SOLN
OROMUCOSAL | Status: AC
  Administered 2020-11-30: 15 mL via OROMUCOSAL
  Filled 2020-11-30: qty 15

## 2020-11-30 MED ORDER — 0.9 % SODIUM CHLORIDE (POUR BTL) OPTIME
TOPICAL | Status: DC | PRN
  Administered 2020-11-30: 1000 mL

## 2020-11-30 MED ORDER — CEFAZOLIN SODIUM-DEXTROSE 2-4 GM/100ML-% IV SOLN
2.0000 g | INTRAVENOUS | Status: AC
  Administered 2020-11-30: 2 g via INTRAVENOUS
  Filled 2020-11-30: qty 100

## 2020-11-30 MED ORDER — KETOROLAC TROMETHAMINE 15 MG/ML IJ SOLN
7.5000 mg | Freq: Four times a day (QID) | INTRAMUSCULAR | Status: DC
  Administered 2020-11-30 – 2020-12-01 (×3): 7.5 mg via INTRAVENOUS
  Filled 2020-11-30 (×3): qty 1

## 2020-11-30 MED ORDER — PANTOPRAZOLE SODIUM 40 MG PO TBEC
40.0000 mg | DELAYED_RELEASE_TABLET | Freq: Every day | ORAL | Status: DC
  Administered 2020-11-30: 40 mg via ORAL
  Filled 2020-11-30: qty 1

## 2020-11-30 MED ORDER — ONDANSETRON HCL 4 MG/2ML IJ SOLN
INTRAMUSCULAR | Status: AC
  Filled 2020-11-30: qty 2

## 2020-11-30 MED ORDER — MENTHOL 3 MG MT LOZG: 1.0000 | LOZENGE | OROMUCOSAL | Status: DC | PRN

## 2020-11-30 MED ORDER — BUPIVACAINE HCL (PF) 0.5 % IJ SOLN
INTRAMUSCULAR | Status: DC | PRN
  Administered 2020-11-30: 5 mL

## 2020-11-30 MED ORDER — PHENOL 1.4 % MT LIQD: 1.0000 | OROMUCOSAL | Status: DC | PRN

## 2020-11-30 MED ORDER — HYDROMORPHONE HCL 1 MG/ML IJ SOLN
0.5000 mg | INTRAMUSCULAR | Status: DC | PRN
Start: 2020-11-30 — End: 2020-12-01

## 2020-11-30 MED ORDER — ASPIRIN EC 81 MG PO TBEC
81.0000 mg | DELAYED_RELEASE_TABLET | Freq: Every day | ORAL | Status: DC
  Administered 2020-12-01: 81 mg via ORAL
  Filled 2020-11-30: qty 1

## 2020-11-30 MED ORDER — LIDOCAINE 2% (20 MG/ML) 5 ML SYRINGE
INTRAMUSCULAR | Status: DC | PRN
  Administered 2020-11-30: 80 mg via INTRAVENOUS

## 2020-11-30 MED ORDER — CHLORHEXIDINE GLUCONATE 0.12 % MT SOLN: 15.0000 mL | Freq: Once | OROMUCOSAL | Status: AC

## 2020-11-30 MED ORDER — ACETAMINOPHEN 650 MG RE SUPP: 650.0000 mg | RECTAL | Status: DC | PRN

## 2020-11-30 MED ORDER — TRAMADOL HCL 50 MG PO TABS
50.0000 mg | ORAL_TABLET | Freq: Four times a day (QID) | ORAL | Status: DC | PRN
  Administered 2020-11-30 – 2020-12-01 (×3): 50 mg via ORAL
  Filled 2020-11-30 (×3): qty 1

## 2020-11-30 MED ORDER — ATORVASTATIN CALCIUM 40 MG PO TABS
40.0000 mg | ORAL_TABLET | Freq: Every day | ORAL | Status: DC
  Administered 2020-11-30: 40 mg via ORAL
  Filled 2020-11-30: qty 1

## 2020-11-30 MED ORDER — NITROGLYCERIN 0.4 MG SL SUBL: 0.4000 mg | SUBLINGUAL_TABLET | SUBLINGUAL | Status: DC | PRN

## 2020-11-30 MED ORDER — POLYETHYLENE GLYCOL 3350 17 G PO PACK: 17.0000 g | PACK | Freq: Every day | ORAL | Status: DC | PRN

## 2020-11-30 MED ORDER — ORAL CARE MOUTH RINSE: 15.0000 mL | Freq: Once | OROMUCOSAL | Status: AC

## 2020-11-30 MED ORDER — AMISULPRIDE (ANTIEMETIC) 5 MG/2ML IV SOLN: 10.0000 mg | Freq: Once | INTRAVENOUS | Status: DC | PRN

## 2020-11-30 MED ORDER — METHOCARBAMOL 750 MG PO TABS
750.0000 mg | ORAL_TABLET | Freq: Four times a day (QID) | ORAL | Status: DC | PRN
  Administered 2020-11-30: 750 mg via ORAL
  Filled 2020-11-30: qty 1

## 2020-11-30 MED ORDER — LIDOCAINE-EPINEPHRINE 1 %-1:100000 IJ SOLN
INTRAMUSCULAR | Status: DC | PRN
  Administered 2020-11-30: 5 mL

## 2020-11-30 MED ORDER — DOCUSATE SODIUM 100 MG PO CAPS
100.0000 mg | ORAL_CAPSULE | Freq: Two times a day (BID) | ORAL | Status: DC
  Administered 2020-11-30 – 2020-12-01 (×3): 100 mg via ORAL
  Filled 2020-11-30 (×3): qty 1

## 2020-11-30 MED ORDER — PROPOFOL 10 MG/ML IV BOLUS
INTRAVENOUS | Status: DC | PRN
  Administered 2020-11-30: 120 mg via INTRAVENOUS

## 2020-11-30 MED ORDER — PHENYLEPHRINE HCL-NACL 20-0.9 MG/250ML-% IV SOLN
INTRAVENOUS | Status: DC | PRN
  Administered 2020-11-30: 15 ug/min via INTRAVENOUS

## 2020-11-30 MED ORDER — CYANOCOBALAMIN 500 MCG PO TABS
1000.0000 ug | ORAL_TABLET | Freq: Every day | ORAL | Status: DC
  Administered 2020-12-01: 1000 ug via ORAL
  Filled 2020-11-30 (×2): qty 2

## 2020-11-30 MED ORDER — ROCURONIUM BROMIDE 10 MG/ML (PF) SYRINGE
PREFILLED_SYRINGE | INTRAVENOUS | Status: AC
  Filled 2020-11-30: qty 10

## 2020-11-30 MED ORDER — LORATADINE 10 MG PO TABS
10.0000 mg | ORAL_TABLET | Freq: Every day | ORAL | Status: DC
  Administered 2020-12-01: 10 mg via ORAL
  Filled 2020-11-30: qty 1

## 2020-11-30 MED ORDER — OXYCODONE HCL 5 MG PO TABS
ORAL_TABLET | ORAL | Status: AC
  Filled 2020-11-30: qty 1

## 2020-11-30 MED ORDER — FLEET ENEMA 7-19 GM/118ML RE ENEM: 1.0000 | ENEMA | Freq: Once | RECTAL | Status: DC | PRN

## 2020-11-30 MED ORDER — OXYCODONE HCL 5 MG/5ML PO SOLN: 5.0000 mg | Freq: Once | ORAL | Status: AC | PRN

## 2020-11-30 MED ORDER — ALUM & MAG HYDROXIDE-SIMETH 200-200-20 MG/5ML PO SUSP: 30.0000 mL | Freq: Four times a day (QID) | ORAL | Status: DC | PRN

## 2020-11-30 MED ORDER — OXYCODONE HCL 5 MG PO TABS: 5.0000 mg | ORAL_TABLET | ORAL | Status: DC | PRN

## 2020-11-30 MED ORDER — SODIUM CHLORIDE 0.9% FLUSH
3.0000 mL | Freq: Two times a day (BID) | INTRAVENOUS | Status: DC
  Administered 2020-11-30 (×2): 3 mL via INTRAVENOUS

## 2020-11-30 MED ORDER — ONDANSETRON HCL 4 MG/2ML IJ SOLN
INTRAMUSCULAR | Status: DC | PRN
Start: 2020-11-30 — End: 2020-11-30
  Administered 2020-11-30: 4 mg via INTRAVENOUS

## 2020-11-30 MED ORDER — THROMBIN 5000 UNITS EX SOLR
OROMUCOSAL | Status: DC | PRN
  Administered 2020-11-30: 5 mL via TOPICAL

## 2020-11-30 MED ORDER — LIDOCAINE-EPINEPHRINE 1 %-1:100000 IJ SOLN
INTRAMUSCULAR | Status: AC
  Filled 2020-11-30: qty 1

## 2020-11-30 MED ORDER — BISACODYL 10 MG RE SUPP: 10.0000 mg | Freq: Every day | RECTAL | Status: DC | PRN

## 2020-11-30 MED ORDER — BUPIVACAINE HCL (PF) 0.5 % IJ SOLN
INTRAMUSCULAR | Status: AC
  Filled 2020-11-30: qty 30

## 2020-11-30 MED ORDER — HYDROCODONE-ACETAMINOPHEN 5-325 MG PO TABS: 2.0000 | ORAL_TABLET | ORAL | Status: DC | PRN

## 2020-11-30 SURGICAL SUPPLY — 66 items
ADH SKN CLS APL DERMABOND .7 (GAUZE/BANDAGES/DRESSINGS) ×1
BAG COUNTER SPONGE SURGICOUNT (BAG) ×3 IMPLANT
BAG SPNG CNTER NS LX DISP (BAG) ×2
BAND INSRT 18 STRL LF DISP RB (MISCELLANEOUS) ×2
BAND RUBBER #18 3X1/16 STRL (MISCELLANEOUS) ×4 IMPLANT
BASKET BONE COLLECTION (BASKET) ×1 IMPLANT
BIT DRILL NEURO 2X3.1 SFT TUCH (MISCELLANEOUS) ×1 IMPLANT
BIT DRILL OZARK SU 2.3X14 (DRILL) ×1 IMPLANT
BLADE ULTRA TIP 2M (BLADE) IMPLANT
BNDG GAUZE ELAST 4 BULKY (GAUZE/BANDAGES/DRESSINGS) IMPLANT
BUR BARREL STRAIGHT FLUTE 4.0 (BURR) ×2 IMPLANT
CANISTER SUCT 3000ML PPV (MISCELLANEOUS) ×2 IMPLANT
CARTRIDGE OIL MAESTRO DRILL (MISCELLANEOUS) ×1 IMPLANT
COVER MAYO STAND STRL (DRAPES) ×2 IMPLANT
DECANTER SPIKE VIAL GLASS SM (MISCELLANEOUS) ×2 IMPLANT
DERMABOND ADVANCED (GAUZE/BANDAGES/DRESSINGS) ×1
DERMABOND ADVANCED .7 DNX12 (GAUZE/BANDAGES/DRESSINGS) ×1 IMPLANT
DIFFUSER DRILL AIR PNEUMATIC (MISCELLANEOUS) ×2 IMPLANT
DRAPE HALF SHEET 40X57 (DRAPES) IMPLANT
DRAPE LAPAROTOMY 100X72 PEDS (DRAPES) ×2 IMPLANT
DRAPE MICROSCOPE LEICA (MISCELLANEOUS) ×2 IMPLANT
DRILL NEURO 2X3.1 SOFT TOUCH (MISCELLANEOUS) ×2
DRILL OZARK SU 2.3X14 (DRILL) ×2
DRSG OPSITE POSTOP 3X4 (GAUZE/BANDAGES/DRESSINGS) ×2 IMPLANT
DURAPREP 6ML APPLICATOR 50/CS (WOUND CARE) ×2 IMPLANT
ELECT COATED BLADE 2.86 ST (ELECTRODE) ×2 IMPLANT
ELECT REM PT RETURN 9FT ADLT (ELECTROSURGICAL) ×2
ELECTRODE REM PT RTRN 9FT ADLT (ELECTROSURGICAL) ×1 IMPLANT
GAUZE 4X4 16PLY ~~LOC~~+RFID DBL (SPONGE) ×1 IMPLANT
GAUZE SPONGE 4X4 12PLY STRL (GAUZE/BANDAGES/DRESSINGS) IMPLANT
GLOVE EXAM NITRILE XL STR (GLOVE) IMPLANT
GLOVE SRG 8 PF TXTR STRL LF DI (GLOVE) ×1 IMPLANT
GLOVE SURG ENC MOIS LTX SZ8 (GLOVE) ×2 IMPLANT
GLOVE SURG LTX SZ8 (GLOVE) ×2 IMPLANT
GLOVE SURG UNDER POLY LF SZ7.5 (GLOVE) ×2 IMPLANT
GLOVE SURG UNDER POLY LF SZ8 (GLOVE) ×2
GLOVE SURG UNDER POLY LF SZ8.5 (GLOVE) ×2 IMPLANT
GOWN STRL REUS W/ TWL LRG LVL3 (GOWN DISPOSABLE) IMPLANT
GOWN STRL REUS W/ TWL XL LVL3 (GOWN DISPOSABLE) ×1 IMPLANT
GOWN STRL REUS W/TWL 2XL LVL3 (GOWN DISPOSABLE) ×3 IMPLANT
GOWN STRL REUS W/TWL LRG LVL3 (GOWN DISPOSABLE) ×4
GOWN STRL REUS W/TWL XL LVL3 (GOWN DISPOSABLE) ×2
HALTER HD/CHIN CERV TRACTION D (MISCELLANEOUS) ×2 IMPLANT
HEMOSTAT POWDER KIT SURGIFOAM (HEMOSTASIS) ×2 IMPLANT
KIT BASIN OR (CUSTOM PROCEDURE TRAY) ×2 IMPLANT
KIT TURNOVER KIT B (KITS) ×2 IMPLANT
NDL HYPO 25X1 1.5 SAFETY (NEEDLE) ×1 IMPLANT
NEEDLE HYPO 25X1 1.5 SAFETY (NEEDLE) ×2 IMPLANT
NEEDLE SPNL 22GX3.5 QUINCKE BK (NEEDLE) ×2 IMPLANT
NS IRRIG 1000ML POUR BTL (IV SOLUTION) ×2 IMPLANT
OIL CARTRIDGE MAESTRO DRILL (MISCELLANEOUS) ×2
PACK LAMINECTOMY NEURO (CUSTOM PROCEDURE TRAY) ×2 IMPLANT
PAD ARMBOARD 7.5X6 YLW CONV (MISCELLANEOUS) IMPLANT
PEEK SPACER AVS AS 6X14X16 4D (Cage) ×4 IMPLANT
PIN DISTRACTION 14MM (PIN) ×5 IMPLANT
PLATE CERV CONS OZARK 2X42 (Plate) ×2 IMPLANT
SCREW VA ST OZARK 4X14 (Screw) ×12 IMPLANT
SCREW VA ST OZARK 4X14 FX (Screw) IMPLANT
SPONGE INTESTINAL PEANUT (DISPOSABLE) ×2 IMPLANT
SPONGE LAP 4X18 RFD (DISPOSABLE) ×1 IMPLANT
SPONGE SURGIFOAM ABS GEL SZ50 (HEMOSTASIS) IMPLANT
STAPLER SKIN PROX WIDE 3.9 (STAPLE) ×1 IMPLANT
SUT VIC AB 3-0 SH 8-18 (SUTURE) ×2 IMPLANT
TOWEL GREEN STERILE (TOWEL DISPOSABLE) ×2 IMPLANT
TOWEL GREEN STERILE FF (TOWEL DISPOSABLE) ×2 IMPLANT
WATER STERILE IRR 1000ML POUR (IV SOLUTION) ×2 IMPLANT

## 2020-11-30 NOTE — Anesthesia Procedure Notes (Signed)
Procedure Name: Intubation Date/Time: 11/30/2020 11:09 AM Performed by: Samara Deist, CRNA Pre-anesthesia Checklist: Patient identified, Emergency Drugs available, Suction available and Patient being monitored Patient Re-evaluated:Patient Re-evaluated prior to induction Oxygen Delivery Method: Circle system utilized Preoxygenation: Pre-oxygenation with 100% oxygen Induction Type: IV induction Ventilation: Mask ventilation without difficulty and Oral airway inserted - appropriate to patient size Laryngoscope Size: Glidescope and 4 Grade View: Grade I Tube type: Oral Tube size: 7.5 mm Number of attempts: 1 Airway Equipment and Method: Stylet and Oral airway Placement Confirmation: ETT inserted through vocal cords under direct vision, positive ETCO2 and breath sounds checked- equal and bilateral Secured at: 22 cm Tube secured with: Tape Dental Injury: Teeth and Oropharynx as per pre-operative assessment

## 2020-11-30 NOTE — Brief Op Note (Signed)
11/30/2020  1:22 PM  PATIENT:  Brendan [name]  80 y.o. male  PRE-OPERATIVE DIAGNOSIS:  Stenosis of cervical spine with myelopathy, herniated cervical discs, C 34 and C 45 levels, radiculopathy, cervicalgia  POST-OPERATIVE DIAGNOSIS:  Stenosis of cervical spine with myelopathy, herniated cervical discs, C 34 and C 45 levels, radiculopathy, cervicalgia  PROCEDURE:  Procedure(s): Cervical three-four, Cervical four-fve  Anterior cervical decompression/discectomy/fusion (N/A) with PEEK cages, autograft, plate  SURGEON:  Surgeon(s) and Role:    * Keo Schirmer, MD - Primary  PHYSICIAN ASSISTANT:   ASSISTANTS: Poteat, RN   ANESTHESIA:   local and general  EBL:  10 mL   BLOOD ADMINISTERED:none  DRAINS: none   LOCAL MEDICATIONS USED:  MARCAINE    and LIDOCAINE   SPECIMEN:  No Specimen  DISPOSITION OF SPECIMEN:  N/A  COUNTS:  YES  TOURNIQUET:  * No tourniquets in log *  DICTATION: DICTATION: Patient is 80 year old male with cervical stenosis and myelopathy with HNP, spondylosis, radiculopathy C 34 and C 45 levels  PROCEDURE: Patient was brought to operating room and following the smooth and uncomplicated induction of general endotracheal anesthesia his head was placed on a horseshoe head holder he was placed in 5 pounds of Holter traction and his anterior neck was prepped and draped in usual sterile fashion. An incision was made on the left side of midline after infiltrating the skin and subcutaneous tissues with local lidocaine. The platysmal layer was incised and subplatysmal dissection was performed exposing the anterior border sternocleidomastoid muscle. Using blunt dissection the carotid sheath was kept lateral and trachea and esophagus kept medial exposing the anterior cervical spine. A bent spinal needle was placed it was felt to be the C 45 level and this was confirmed on intraoperative x-ray. Longus coli muscles were taken down from the anterior cervical spine using  electrocautery and key elevator and self-retaining retractor was placed exposing the C 34 and C 45 levels. The interspaces were incised and a thorough discectomy was performed. Distraction pins were placed. Initially the C 34 level was operated. Uncinate spurs and central spondylitic ridges were drilled down with a high-speed drill. The spinal cord dura and both C 4 nerve roots were widely decompressed. Hemostasis was assured. After trial sizing a 6 mm large footprint peek interbody cage was selected and packed with local autograft. This was tamped into position and countersunk appropriately. Attention was the paid to the C 45 level, where similar decompression was performed.  Uncinate spurs and central spondylitic ridges were drilled down with a high-speed drill. The spinal cord dura and both C 5 nerve roots were widely decompressed. Hemostasis was assured. After trial sizing a 6 mm large footprint peek interbody cage was selected and packed with local autograft. This was tamped into position and countersunk appropriately. Distraction weight was removed. A 42 mm [place] anterior cervical plate was affixed to the cervical spine with 14 mm variable-angle screws 2 at C 3, 2 at C 4 and 2 fixed angle screws at C 5. All screws were well-positioned and locking mechanisms were engaged. A final X ray was obtained which showed well positioned grafts and anterior plate without complicating features. Soft tissues were inspected and found to be in good repair. The wound was irrigated. The platysma layer was closed with 3-0 Vicryl stitches and the skin was reapproximated with 3-0 Vicryl subcuticular stitches. The wound was dressed with Dermabond and an occlusive dressing. Counts were correct at the end of the case. Patient was   extubated and taken to recovery in stable and satisfactory condition.    PLAN OF CARE: Admit for overnight observation  PATIENT DISPOSITION:  PACU - hemodynamically stable.   Delay start of  Pharmacological VTE agent (>24hrs) due to surgical blood loss or risk of bleeding: yes

## 2020-11-30 NOTE — Progress Notes (Signed)
Dr. Donavan Foil aware of blood glucose level.  No order received.

## 2020-11-30 NOTE — H&P (Signed)
Patient ID:   [id number] Patient: Brendan [name]  Date of Birth: [birth year] Visit Type: Office Visit   Date: 09/30/2020 02:45 PM Provider: Danae Orleans. [name] MD   This 80 year old male presents for Arm pain.  HISTORY OF PRESENT ILLNESS: 1.  Arm pain  Last seen in 2019 following deep brain stimulator placement for essential tremor, patient returns on referral from Dr. Arbutus Leas for evaluation of cervical spine MRI with cord compression. Reports increasing ataxia with left shoulder and arm pain.  New diet-controlled diabetes diagnosis  MRI on canopy  The patient has severe spinal stenosis with cord compression at the C3-4 and C4-5 levels with the AP diameter of his spinal canal down to 4.5 mm at C3-4 and a bit larger at the C4-5 level.  He has no significant cord compression although relatively mild central stenosis at the C5-6 and C6-7 levels.  The patient has a myelopathy on examination with hyperreflexia in the upper and lower extremities and positive Hoffmann signs and with bilateral finger extensor and hand intrinsic weakness.  He notes pain over his left trapezius along with weakness in his left arm in particular.  At this point I have recommended decompressing his spinal cord at the C3-4 and C4-5 levels.  I do not see signs of significant radiculopathy related to the C5-6 or C6-7 levels.      Medical/Surgical/Interim History Reviewed, no change.  Last detailed document date:01/31/2017.     PAST MEDICAL HISTORY, SURGICAL HISTORY, FAMILY HISTORY, SOCIAL HISTORY AND REVIEW OF SYSTEMS I have reviewed the patient's past medical, surgical, family and social history as well as the comprehensive review of systems as included on the [facility] history form dated 01/31/2017, which I have signed.  Family History: Reviewed, no changes.  Last detailed document date:01/31/2017.   Social History: Reviewed, no changes. Last detailed document date: 01/31/2017.     MEDICATIONS: (added, continued or stopped this visit) Started Medication Directions Instruction Stopped  albuterol sulfate HFA 90 mcg/actuation aerosol inhaler inhale 2 puff by inhalation route  every 4 - 6 hours as needed    Aspirin Low Dose 81 mg tablet,delayed release take 1 tablet by oral route  every day    atorvastatin 40 mg tablet take 1 tablet by oral route  every day    fish oil  ORAL   09/30/2020  gabapentin 300 mg capsule take 1 capsule by oral route 3 times every day  09/30/2020  loratadine 10 mg capsule     losartan 100 mg tablet take 1 tablet by oral route  every day    nitroglycerin 0.4 mg sublingual tablet place 1 tablet by sublingual route at 1st sign of attack; may repeat every 5 minutes up to 3 tabs; if norelief seek medical help    primidone 250 mg tablet take 1 tablet by oral route 3 times every day for maintenance  09/30/2020  propanolol ER 60 mg ORAL     vitamin b12       ALLERGIES: Ingredient Reaction Medication Name Comment LISINOPRIL     Reviewed, updated.    PHYSICAL EXAM:  Vitals Date Temp F BP Pulse Ht In Wt Lb BMI BSA Pain Score 09/30/2020  159/72 57 66 188 30.34  6/10     IMPRESSION:  The patient has a significant cervical myelopathy with cervical cord compression and canal stenosis.  PLAN: I have recommended anterior cervical decompression and fusion C3-4 and C4-5 levels.  The patient is aware of risks and  benefits of surgery and wishes to proceed.  He will have to have the deep brain stimulators turned off prior to surgery.  Orders: Diagnostic Procedures: Assessment Procedure M54.12 Cervical Spine- Lateral Instruction(s)/Education: Assessment Instruction I10 Lifestyle education Z68.30 Lifestyle education regarding diet Miscellaneous: Assessment  M48.02 Soft Cervical Collar  Completed Orders (this  encounter) Order Details Reason Side Interpretation Result Initial Treatment Date Region Lifestyle education regarding diet Encouraged patient to eat well balanced diet.       Lifestyle education Patient will follow-up with primary care physician.        Assessment/Plan  # Detail Type Description  1. Assessment Stenosis of cervical spine with myelopathy (M48.02).  Plan Orders Soft Cervical Collar.     2. Assessment Myelopathy in diseases classified elsewhere (G99.2).     3. Assessment Cervical radiculopathy (M54.12).     4. Assessment Essential (primary) hypertension (I10).     5. Assessment Body mass index (BMI) 30.0-30.9, adult (Z68.30).  Plan Orders Today's instructions / counseling include(s) Lifestyle education regarding diet. Clinical information/comments: Encouraged patient to eat well balanced diet.       Pain Management Plan Pain Scale: 6/10. Method: Numeric Pain Intensity Scale. Location: back. Onset: 11/22/2016. Duration: varies. Quality: discomforting. Pain management follow-up plan of care: Patient will continue medication management..              Provider:  Danae Orleans. [name] MD  10/01/2020 02:51 PM    Dictation edited by: Danae Orleans. [name]    CC Providers: [name] [facility] [address] [place],  [place]  [address]-   Lurena Joiner Tat  [address] [place], [place] [address]               Electronically signed by Danae Orleans [name] MD on 10/01/2020 02:51 PM

## 2020-11-30 NOTE — Op Note (Signed)
11/30/2020  1:22 PM  PATIENT:  Brendan [name]  80 y.o. male  PRE-OPERATIVE DIAGNOSIS:  Stenosis of cervical spine with myelopathy, herniated cervical discs, C 34 and C 45 levels, radiculopathy, cervicalgia  POST-OPERATIVE DIAGNOSIS:  Stenosis of cervical spine with myelopathy, herniated cervical discs, C 34 and C 45 levels, radiculopathy, cervicalgia  PROCEDURE:  Procedure(s): Cervical three-four, Cervical four-fve  Anterior cervical decompression/discectomy/fusion (N/A) with PEEK cages, autograft, plate  SURGEON:  Surgeon(s) and Role:    Maeola Harman, MD - Primary  PHYSICIAN ASSISTANT:   ASSISTANTS: Poteat, RN   ANESTHESIA:   local and general  EBL:  10 mL   BLOOD ADMINISTERED:none  DRAINS: none   LOCAL MEDICATIONS USED:  MARCAINE    and LIDOCAINE   SPECIMEN:  No Specimen  DISPOSITION OF SPECIMEN:  N/A  COUNTS:  YES  TOURNIQUET:  * No tourniquets in log *  DICTATION: DICTATION: Patient is 80 year old male with cervical stenosis and myelopathy with HNP, spondylosis, radiculopathy C 34 and C 45 levels  PROCEDURE: Patient was brought to operating room and following the smooth and uncomplicated induction of general endotracheal anesthesia his head was placed on a horseshoe head holder he was placed in 5 pounds of Holter traction and his anterior neck was prepped and draped in usual sterile fashion. An incision was made on the left side of midline after infiltrating the skin and subcutaneous tissues with local lidocaine. The platysmal layer was incised and subplatysmal dissection was performed exposing the anterior border sternocleidomastoid muscle. Using blunt dissection the carotid sheath was kept lateral and trachea and esophagus kept medial exposing the anterior cervical spine. A bent spinal needle was placed it was felt to be the C 45 level and this was confirmed on intraoperative x-ray. Longus coli muscles were taken down from the anterior cervical spine using  electrocautery and key elevator and self-retaining retractor was placed exposing the C 34 and C 45 levels. The interspaces were incised and a thorough discectomy was performed. Distraction pins were placed. Initially the C 34 level was operated. Uncinate spurs and central spondylitic ridges were drilled down with a high-speed drill. The spinal cord dura and both C 4 nerve roots were widely decompressed. Hemostasis was assured. After trial sizing a 6 mm large footprint peek interbody cage was selected and packed with local autograft. This was tamped into position and countersunk appropriately. Attention was the paid to the C 45 level, where similar decompression was performed.  Uncinate spurs and central spondylitic ridges were drilled down with a high-speed drill. The spinal cord dura and both C 5 nerve roots were widely decompressed. Hemostasis was assured. After trial sizing a 6 mm large footprint peek interbody cage was selected and packed with local autograft. This was tamped into position and countersunk appropriately. Distraction weight was removed. A 42 mm [place] anterior cervical plate was affixed to the cervical spine with 14 mm variable-angle screws 2 at C 3, 2 at C 4 and 2 fixed angle screws at C 5. All screws were well-positioned and locking mechanisms were engaged. A final X ray was obtained which showed well positioned grafts and anterior plate without complicating features. Soft tissues were inspected and found to be in good repair. The wound was irrigated. The platysma layer was closed with 3-0 Vicryl stitches and the skin was reapproximated with 3-0 Vicryl subcuticular stitches. The wound was dressed with Dermabond and an occlusive dressing. Counts were correct at the end of the case. Patient was  extubated and taken to recovery in stable and satisfactory condition.    PLAN OF CARE: Admit for overnight observation  PATIENT DISPOSITION:  PACU - hemodynamically stable.   Delay start of  Pharmacological VTE agent (>24hrs) due to surgical blood loss or risk of bleeding: yes

## 2020-11-30 NOTE — Interval H&P Note (Signed)
History and Physical Interval Note:  11/30/2020 10:50 AM  Brendan [name]  has presented today for surgery, with the diagnosis of Stenosis of cervical spine with myelopathy.  The various methods of treatment have been discussed with the patient and family. After consideration of risks, benefits and other options for treatment, the patient has consented to  Procedure(s) with comments: Cervical 3-4, Cervical 4-5 Anterior cervical decompression/discectomy/fusion (N/A) - 3C/RM 21 as a surgical intervention.  The patient's history has been reviewed, patient examined, no change in status, stable for surgery.  I have reviewed the patient's chart and labs.  Questions were answered to the patient's satisfaction.     Dorian Heckle

## 2020-11-30 NOTE — Transfer of Care (Signed)
Immediate Anesthesia Transfer of Care Note  Patient: Brendan [name]  Procedure(s) Performed: Cervical three-four, Cervical four-fve  Anterior cervical decompression/discectomy/fusion  Patient Location: PACU  Anesthesia Type:General  Level of Consciousness: awake, alert  and oriented  Airway & Oxygen Therapy: Patient Spontanous Breathing and Patient connected to face mask oxygen  Post-op Assessment: Report given to RN and Post -op Vital signs reviewed and stable  Post vital signs: Reviewed and stable  Last Vitals:  Vitals Value Taken Time  BP 171/80 11/30/20 1324  Temp    Pulse 96 11/30/20 1328  Resp 18 11/30/20 1328  SpO2 96 % 11/30/20 1328  Vitals shown include unvalidated device data.  Last Pain:  Vitals:   11/30/20 0951  TempSrc:   PainSc: 0-No pain         Complications: No notable events documented.

## 2020-12-01 ENCOUNTER — Encounter (HOSPITAL_COMMUNITY): Payer: Self-pay | Admitting: Neurosurgery

## 2020-12-01 DIAGNOSIS — M50121 Cervical disc disorder at C4-C5 level with radiculopathy: Secondary | ICD-10-CM | POA: Diagnosis not present

## 2020-12-01 MED ORDER — HYDROCODONE-ACETAMINOPHEN 5-325 MG PO TABS: 1.0000 | ORAL_TABLET | ORAL | 0 refills | Status: AC | PRN

## 2020-12-01 MED ORDER — DEXAMETHASONE 4 MG PO TABS
4.0000 mg | ORAL_TABLET | Freq: Four times a day (QID) | ORAL | Status: DC
  Administered 2020-12-01 (×2): 4 mg via ORAL
  Filled 2020-12-01 (×2): qty 1

## 2020-12-01 MED ORDER — METHOCARBAMOL 750 MG PO TABS: 750.0000 mg | ORAL_TABLET | Freq: Four times a day (QID) | ORAL | 0 refills | Status: DC | PRN

## 2020-12-01 MED ORDER — DEXAMETHASONE 1 MG PO TABS: 4.0000 mg | ORAL_TABLET | Freq: Two times a day (BID) | ORAL | 0 refills | Status: AC

## 2020-12-01 NOTE — Evaluation (Signed)
Occupational Therapy Evaluation Patient Details Name: Brendan [name] MRN: [id number] DOB: 07-18-1940 Today's Date: 12/01/2020    History of Present Illness 80 yo M s/p ACDF.  PMH includes but not limited to brain stimulator.   Clinical Impression   Patient admitted for the diagnosis and procedure above.  PTA he was able to complete all ADL from a sit/stand level, and needed the occasional use of RW.  He was cutting his lawn and driving locally.  Patient is awaiting a device to turn his brain stimulator back on, so currently he is a little unstable.  He will have assist as needed at home, and should do well with family assist.  No further needs in the acute setting.  No barriers to discharge.  All questions answered, and patient with fair recall of precautions.          Follow Up Recommendations  No OT follow up    Equipment Recommendations  None recommended by OT    Recommendations for Other Services       Precautions / Restrictions Precautions Precautions: Cervical Precaution Booklet Issued: Yes (comment) Precaution Comments: precautions reviewed Restrictions Weight Bearing Restrictions: No      Mobility Bed Mobility Overal bed mobility: Modified Independent             General bed mobility comments: HOB elevated - he sleeps in a recliner. Patient Response: Cooperative  Transfers Overall transfer level: Needs assistance   Transfers: Sit to/from [employer] Transfers Sit to Stand: Supervision Stand pivot transfers: Supervision       General transfer comment: safer with RW for now. Wife is bringing a device to turn on his brain stimulator    Balance Overall balance assessment: Needs assistance Sitting-balance support: Feet supported Sitting balance-Leahy Scale: Good     Standing balance support: Bilateral upper extremity supported Standing balance-Leahy Scale: Fair Standing balance comment: able to static stand without RW                            ADL either performed or assessed with clinical judgement   ADL Overall ADL's : Modified independent                                       General ADL Comments: patient has not had brain stimulator turned back on     Vision Patient Visual Report: No change from baseline       Perception     Praxis      Pertinent Vitals/Pain Pain Assessment: 0-10 Pain Score: 3  Pain Descriptors / Indicators: Tender;Tightness Pain Intervention(s): Monitored during session     Hand Dominance Left   Extremity/Trunk Assessment Upper Extremity Assessment Upper Extremity Assessment: Generalized weakness   Lower Extremity Assessment Lower Extremity Assessment: Defer to PT evaluation   Cervical / Trunk Assessment Cervical / Trunk Assessment: Other exceptions Cervical / Trunk Exceptions: C spine surgery   Communication Communication Communication: No difficulties;HOH   Cognition Arousal/Alertness: Awake/alert Behavior During Therapy: WFL for tasks assessed/performed Overall Cognitive Status: Within Functional Limits for tasks assessed                                 General Comments: mild forgetfulness - following commands well   General Comments       Exercises  Shoulder Instructions      Home Living Family/patient expects to be discharged to:: Private residence Living Arrangements: Spouse/significant other;Children Available Help at Discharge: Family;Available 24 hours/day Type of Home: House Home Access: Stairs to enter [employer] of Steps: 5 Entrance Stairs-Rails: Can reach both;Left;Right Home Layout: One level     Bathroom Shower/Tub: Producer, television/film/video: Standard Bathroom Accessibility: Yes How Accessible: Accessible via walker Home Equipment: Walker - 2 wheels;Bedside commode          Prior Functioning/Environment Level of Independence: Independent        Comments: occasional use of 2WRW         OT Problem List: Decreased strength;Impaired balance (sitting and/or standing);Pain      OT Treatment/Interventions:      OT Goals(Current goals can be found in the care plan section) Acute Rehab OT Goals Patient Stated Goal: Return home with wife OT Goal Formulation: With patient Time For Goal Achievement: 12/01/20 Potential to Achieve Goals: Good  OT Frequency:     Barriers to D/C:  None noted          Co-evaluation              AM-PAC OT "6 Clicks" Daily Activity     Outcome Measure Help from another person eating meals?: None Help from another person taking care of personal grooming?: None Help from another person toileting, which includes using toliet, bedpan, or urinal?: A Little Help from another person bathing (including washing, rinsing, drying)?: A Little Help from another person to put on and taking off regular upper body clothing?: None Help from another person to put on and taking off regular lower body clothing?: A Little 6 Click Score: 21   End of Session Equipment Utilized During Treatment: Rolling walker Nurse Communication: Mobility status  Activity Tolerance: Patient tolerated treatment well Patient left: in bed;with call bell/phone within reach  OT Visit Diagnosis: Unsteadiness on feet (R26.81)                Time: 5170-0174 OT Time Calculation (min): 25 min Charges:  OT General Charges $OT Visit: 1 Visit OT Evaluation $OT Eval Moderate Complexity: 1 Mod OT Treatments $Self Care/Home Management : 8-22 mins  12/01/2020  Rich, OTR/L  Acute Rehabilitation Services  Office:  [phone]   Brendan [name] 12/01/2020, 8:43 AM

## 2020-12-01 NOTE — Progress Notes (Signed)
Patient alert and oriented, voiding adequately, skin clean, dry and intact without evidence of skin break down, or symptoms of complications - no redness or edema noted, only slight tenderness and swelling at incision site.  Patient states pain is manageable at time of discharge. Patient has an appointment with MD in 3 weeks

## 2020-12-01 NOTE — Evaluation (Signed)
Physical Therapy Evaluation Patient Details Name: Brendan [name] MRN: [id number] DOB: 01-12-1941 Today's Date: 12/01/2020   History of Present Illness  The pt is a 80 yo male presenting 8/16 s/p C3-4, C4-5 ACDF due to stenosis of cervical spine with myelopathy. PMH includes: COPD, HTN, HLD, CAD s/p CABG x3, DM II, anxiety, essential tremor s/p bilateal deep brain stimulator in 2019 compicated by post-op ICH causing R-sided weakness, and CRPS in RUE.   Clinical Impression  Pt in bed upon arrival of PT, agreeable to evaluation at this time. Prior to admission the pt was mobilizing with intermittent use of RW, but reports frequent instances of knee buckling and frequent falls. The pt now presents with limitations in functional mobility, endurance, dynamic and reactive stability, and safety awareness due to above dx, and will continue to benefit from skilled PT to address these deficits. The pt was able to complete sit-stand transfers without need for physical assist, but benefits from use of RW for dynamic mobility at this point due to moderate instability. The pt had x1 LOB when stepping outside of RW to reach for rail at stairs, tripping on RW and then LOB with no stepping reaction to regain stability. The pt is at significant increased risk of falls and will benefit from supervision with mobility as well as use of RW and resumption of HHPT services when cleared for d/c.     Follow Up Recommendations Home health PT;Supervision for mobility/OOB    Equipment Recommendations  None recommended by PT (pt has needed equipment)    Recommendations for Other Services       Precautions / Restrictions Precautions Precautions: Cervical;Fall Precaution Booklet Issued: Yes (comment) Precaution Comments: precautions reviewed Required Braces or Orthoses: Cervical Brace Cervical Brace: Soft collar;Other (comment) (applied in sitting) Restrictions Weight Bearing Restrictions: No      Mobility  Bed  Mobility Overal bed mobility: Modified Independent             General bed mobility comments: HOB elevated - he sleeps in a recliner.    Transfers Overall transfer level: Needs assistance Equipment used: Rolling walker (2 wheeled) Transfers: Sit to/from [employer] Sit to Stand: Supervision Stand pivot transfers: Supervision       General transfer comment: cues for use of hands on support surface, pt able to power up without assist x3 in session  Ambulation/Gait Ambulation/Gait assistance: Min guard Gait Distance (Feet): 250 Feet Assistive device: Standard walker;None Gait Pattern/deviations: Step-through pattern;Decreased stride length;Drifts right/left     General Gait Details: pt needing cues for trunk position, posture, position in RW, and not stepping/leaning outside RW. no overt LOB, but minG for safety. pt then needing minG to manage ambulation without AD. no overt LOB, but pt with increased instability  Stairs Stairs: Yes Stairs assistance: Supervision Stair Management: Two rails;Alternating pattern;Step to pattern;Forwards Number of Stairs: 6 General stair comments: pt with alternating pattern ascending, step-to descending    Balance Overall balance assessment: Needs assistance Sitting-balance support: Feet supported Sitting balance-Leahy Scale: Good     Standing balance support: Bilateral upper extremity supported Standing balance-Leahy Scale: Fair Standing balance comment: able to static stand without RW                             Pertinent Vitals/Pain Pain Assessment: Faces Pain Score: 3  Faces Pain Scale: Hurts a little bit Pain Location: incision Pain Descriptors / Indicators: Tender;Tightness Pain Intervention(s): Limited activity within  patient's tolerance;Monitored during session;Repositioned    Home Living Family/patient expects to be discharged to:: Private residence Living Arrangements: Spouse/significant  other;Children Available Help at Discharge: Family;Available 24 hours/day Type of Home: House Home Access: Stairs to enter Entrance Stairs-Rails: Can reach both;Left;Right Entrance Stairs-Number of Steps: 5 Home Layout: One level Home Equipment: Walker - 2 wheels;Bedside commode;Cane - single point;Grab bars - tub/shower;Shower seat      Prior Function Level of Independence: Independent         Comments: occasional use of RW, pt currently getting HHPT for balance, still driving and using driving lawn mower     Hand Dominance   Dominant Hand: Left    Extremity/Trunk Assessment   Upper Extremity Assessment Upper Extremity Assessment: Defer to OT evaluation    Lower Extremity Assessment Lower Extremity Assessment: Generalized weakness    Cervical / Trunk Assessment Cervical / Trunk Assessment: Other exceptions Cervical / Trunk Exceptions: C spine surgery  Communication   Communication: No difficulties;HOH  Cognition Arousal/Alertness: Awake/alert Behavior During Therapy: WFL for tasks assessed/performed Overall Cognitive Status: Within Functional Limits for tasks assessed                                 General Comments: mild forgetfulness - following commands well      General Comments General comments (skin integrity, edema, etc.): VSS on RA, cues for application of spinal precautions to mobility. daughter present and agreeable to all education    Exercises     Assessment/Plan    PT Assessment Patient needs continued PT services  PT Problem List Decreased strength;Decreased activity tolerance;Decreased balance;Decreased mobility;Decreased coordination;Decreased safety awareness       PT Treatment Interventions DME instruction;Gait training;Functional mobility training;Stair training;Therapeutic activities;Therapeutic exercise;Balance training;Patient/family education    PT Goals (Current goals can be found in the Care Plan section)  Acute  Rehab PT Goals Patient Stated Goal: Return home with wife PT Goal Formulation: With patient Time For Goal Achievement: 12/15/20 Potential to Achieve Goals: Good    Frequency Min 5X/week    AM-PAC PT "6 Clicks" Mobility  Outcome Measure Help needed turning from your back to your side while in a flat bed without using bedrails?: A Little Help needed moving from lying on your back to sitting on the side of a flat bed without using bedrails?: A Little Help needed moving to and from a bed to a chair (including a wheelchair)?: A Little Help needed standing up from a chair using your arms (e.g., wheelchair or bedside chair)?: A Little Help needed to walk in hospital room?: A Little Help needed climbing 3-5 steps with a railing? : A Little 6 Click Score: 18    End of Session Equipment Utilized During Treatment: Gait belt Activity Tolerance: Patient tolerated treatment well Patient left: in bed;with call bell/phone within reach;with family/visitor present Nurse Communication: Mobility status PT Visit Diagnosis: Unsteadiness on feet (R26.81);Other abnormalities of gait and mobility (R26.89);Muscle weakness (generalized) (M62.81)    Time: 0160-1093 PT Time Calculation (min) (ACUTE ONLY): 27 min   Charges:   PT Evaluation $PT Eval Low Complexity: 1 Low PT Treatments $Gait Training: 8-22 mins        Lazarus Gowda, PT, DPT   Acute Rehabilitation Department Pager #: [phone]  Ronnie Derby 12/01/2020, 10:55 AM

## 2020-12-01 NOTE — Progress Notes (Signed)
Subjective: Patient reports that he is having some difficulty swallowing. No other complaints at this time. No acute events overnight.  Objective: Vital signs in last 24 hours: Temp:  [97.7 F (36.5 C)-98.7 F (37.1 C)] 98.1 F (36.7 C) (08/17 0720) Pulse Rate:  [58-94] 59 (08/17 0720) Resp:  [7-26] 16 (08/17 0720) BP: (147-176)/(62-87) 156/77 (08/17 0720) SpO2:  [95 %-98 %] 95 % (08/17 0720) Weight:  [85.3 kg] 85.3 kg (08/16 0921)  Intake/Output from previous day: 08/16 0701 - 08/17 0700 In: 300 [I.V.:300] Out: 10 [Blood:10] Intake/Output this shift: No intake/output data recorded.  Physical Exam: Patient is awake, A/O X 4, conversant, and in good spirits. They are in NAD and VSS. Doing well. Speech is fluent and appropriate. MAEW. Sensation to light touch is intact. PERLA, EOMI. CNs grossly intact. Dressing is clean dry intact. Incision is well approximated with no drainage, erythema, or edema.    Lab Results: No results for input(s): WBC, HGB, HCT, PLT in the last 72 hours. BMET No results for input(s): NA, K, CL, CO2, GLUCOSE, BUN, CREATININE, CALCIUM in the last 72 hours.  Studies/Results: DG Cervical Spine 2 or 3 views  Result Date: 11/30/2020 CLINICAL DATA:  Elective surgery. C3-4 C4-5 anterior cervical decompression discectomy infusion. EXAM: CERVICAL SPINE - 2-3 VIEW COMPARISON:  None. FINDINGS: Two portable cross-table lateral views of the cervical spine obtained in the operating rib submitted. Image dated 11/30/2020 at 11:39 demonstrates surgical instrument at the anterior C4-C5 disc space. Image dated 11/30/2020 at 1251 demonstrates anterior fusion C3-C5 with interbody spacers in place. IMPRESSION: Two cross-table lateral views of the cervical spine obtained in the operating room, initially localizing to C4-C5 level, with subsequent C3 through C5 anterior fusion hardware in place. Electronically Signed   By: Narda Rutherford M.D.   On: 11/30/2020 15:35     Assessment/Plan: Patient is post-op day 1 s/p C3-4 and C4-5 ACDF. He is recovering well and reports a significant reduction of his preoperative symptoms.  His only complaint is some difficulty with swallowing. He is awaiting PT/OT evaluation  Continue hard cervical collar.  Continue working on pain control, mobility and ambulating patient. Will plan for discharge today as long as the patient is able to swallow appropriately.   -Will do 4 mg PO Decadron X 3 doses    LOS: 0 days     Council Mechanic, DNP, NP-C 12/01/2020, 8:13 AM

## 2020-12-01 NOTE — Progress Notes (Signed)
CSW met with pt regarding recommendation for [facility]. Pt daughter Lovey Newcomer with pt in room, permission given to speak with Lovey Newcomer and wife [name].  CSW informed that [facility] has been active with pt, services on hold due to surgery.  CSW spoke with Esmond Camper at [facility].  Due to staffing, they cannot resume until Monday, 12/06/20.  RN informed and will inform MD as well. Lurline Idol, MSW, LCSW 8/17/20221:59 PM

## 2020-12-01 NOTE — Anesthesia Postprocedure Evaluation (Signed)
Anesthesia Post Note  Patient: Brendan [name]  Procedure(s) Performed: Cervical three-four, Cervical four-fve  Anterior cervical decompression/discectomy/fusion     Patient location during evaluation: PACU Anesthesia Type: General Level of consciousness: awake Pain management: pain level controlled Vital Signs Assessment: post-procedure vital signs reviewed and stable Respiratory status: spontaneous breathing and respiratory function stable Cardiovascular status: stable Postop Assessment: no apparent nausea or vomiting Anesthetic complications: no   No notable events documented.  Last Vitals:  Vitals:   12/01/20 0314 12/01/20 0720  BP: (!) 164/74 (!) 156/77  Pulse: (!) 58 (!) 59  Resp: 18 16  Temp: 36.5 C 36.7 C  SpO2: 97% 95%    Last Pain:  Vitals:   12/01/20 0720  TempSrc: Oral  PainSc:    Pain Goal: Patients Stated Pain Goal: 2 (12/01/20 0608)                 Mellody Dance

## 2020-12-01 NOTE — Discharge Summary (Addendum)
Physician Discharge Summary  Patient ID: Brendan [name] MRN: [id number] DOB/AGE: 1940-05-14 80 y.o.  Admit date: 11/30/2020 Discharge date: 12/01/2020  Admission Diagnoses: Stenosis of cervical spine with myelopathy, herniated cervical discs, C 34 and C 45 levels, radiculopathy, cervicalgia  Discharge Diagnoses: Stenosis of cervical spine with myelopathy, herniated cervical discs, C 34 and C 45 levels, radiculopathy, cervicalgia Active Problems:   Cervical myelopathy [facility])   Discharged Condition: good  Hospital Course: The patient was admitted on 11/30/2020 and taken to the operating room where the patient underwent decompression and fusion. The patient tolerated the procedure well and was taken to the recovery room and then to the floor in stable condition. The hospital course was routine. There were no complications. The wound remained clean dry and intact. Pt had appropriate upper back soreness. No complaints of arm pain or new N/T/W. The patient remained afebrile with stable vital signs, and tolerated a regular diet. The patient continued to increase activities, and pain was well controlled with oral pain medications.    Consults: None  Significant Diagnostic Studies: radiology: X-Ray: intraoperative   Treatments: surgery: Cervical three-four, Cervical four-fve  Anterior cervical decompression/discectomy/fusion (N/A) with PEEK cages, autograft, plate   Discharge Exam: Blood pressure (!) 165/72, pulse 68, temperature 98.3 F (36.8 C), temperature source Oral, resp. rate 16, height 5\' 6"  (1.676 m), weight 85.3 kg, SpO2 95 %.  Physical Exam: Patient is awake, A/O X 4, conversant, and in good spirits. They are in NAD and VSS. Doing well. Speech is fluent and appropriate. MAEW. Sensation to light touch is intact. PERLA, EOMI. CNs grossly intact. Dressing is clean dry intact. Incision is well approximated with no drainage, erythema, or edema.  Disposition: Discharge disposition: 01-Home  or Self Care       Allergies as of 12/01/2020       Reactions   Lisinopril Cough        Medication List     STOP taking these medications    traMADol 50 MG tablet Commonly known as: ULTRAM       TAKE these medications    albuterol 108 (90 Base) MCG/ACT inhaler Commonly known as: ProAir HFA INHALE 2 PUFFS INTO THE LUNGS 4 TIMES DAILY AS NEEDED What changed:  how much to take how to take this when to take this reasons to take this additional instructions   aspirin EC 81 MG tablet Take 81 mg by mouth daily.   atorvastatin 40 MG tablet Commonly known as: LIPITOR TAKE 1 TABLET BY MOUTH DAILY   CLEAR EYES COMPLETE OP Place 1 drop into both eyes daily as needed (dry eyes).   dexamethasone 1 MG tablet Commonly known as: DECADRON Take 4 tablets (4 mg total) by mouth 2 (two) times daily with a meal for 2 days.   fluticasone 50 MCG/ACT nasal spray Commonly known as: FLONASE Place 2 sprays into both nostrils in the morning. What changed:  how much to take when to take this reasons to take this   furosemide 20 MG tablet Commonly known as: LASIX Take 1 tablet twice a week (Sunday, Thursday)   HYDROcodone-acetaminophen 5-325 MG tablet Commonly known as: NORCO/VICODIN Take 1-2 tablets by mouth every 4 (four) hours as needed for moderate pain.   loratadine 10 MG tablet Commonly known as: CLARITIN Take 10 mg by mouth daily.   melatonin 3 MG Tabs tablet Take 1 tablet (3 mg total) by mouth at bedtime.   methocarbamol 750 MG tablet Commonly known as: ROBAXIN TAKE 1  TABLET BY MOUTH TWICE DAILY AS NEEDED FOR MUSCLE SPASMS What changed: Another medication with the same name was added. Make sure you understand how and when to take each.   methocarbamol 750 MG tablet Commonly known as: ROBAXIN Take 1 tablet (750 mg total) by mouth every 6 (six) hours as needed for muscle spasms. What changed: You were already taking a medication with the same name, and this  prescription was added. Make sure you understand how and when to take each.   nitroGLYCERIN 0.4 MG SL tablet Commonly known as: NITROSTAT Place 1 tablet (0.4 mg total) under the tongue every 5 (five) minutes as needed for chest pain. call 911 if chest pain not better What changed:  reasons to take this additional instructions   omeprazole 20 MG capsule Commonly known as: PRILOSEC TAKE 1 CAPSULE BY MOUTH DAILY AS NEEDED FOR ACID REFLUX What changed: See the new instructions.   propranolol 40 MG tablet Commonly known as: INDERAL TAKE 1 TABLET BY MOUTH TWICE DAILY   traZODone 100 MG tablet Commonly known as: DESYREL TAKE 1 TABLET BY MOUTH EACH NIGHT AT BEDTIME What changed: See the new instructions.   Vitamin B 12 500 MCG Tabs Take 1,000 mcg by mouth daily.        Follow-up Information     [facility] Follow up.   Why: [facility] will contact you to schedule your next home visit on Monday, 12/06/20. Contact information: P: [phone]        Maeola Harman, MD. Call.   Specialty: Neurosurgery Why: As needed, If symptoms worsen Contact information: [address] [place] [address] [phone]                 Signed: Council Mechanic, DNP, NP-C 12/01/2020, 3:14 PM

## 2020-12-01 NOTE — Discharge Instructions (Signed)
Wound Care REmove outer dressing in 2 - 3 days Leave incision open to air. You may shower. Do not scrub directly on incision.  Do not put any creams, lotions, or ointments on incision. Activity Walk each and every day, increasing distance each day. No lifting greater than 5 lbs.  Avoid excessive neck motion. No driving for 2 weeks; may ride as a passenger locally. Wear neck brace at all times except when showering.  If provided soft collar, may wear for comfort unless otherwise instructed. Diet Resume your normal diet.  Return to Work Will be discussed at you follow up appointment. Call Your Doctor If Any of These Occur Redness, drainage, or swelling at the wound.  Temperature greater than 101 degrees. Severe pain not relieved by pain medication. Increased difficulty swallowing. Incision starts to come apart. Follow Up Appt Call today for appointment in 3-4 weeks ([phone]) or for problems.  If you have any hardware placed in your spine, you will need an x-ray before your appointment.

## 2020-12-06 DIAGNOSIS — Z7982 Long term (current) use of aspirin: Secondary | ICD-10-CM | POA: Diagnosis not present

## 2020-12-06 DIAGNOSIS — G250 Essential tremor: Secondary | ICD-10-CM | POA: Diagnosis not present

## 2020-12-06 DIAGNOSIS — Z981 Arthrodesis status: Secondary | ICD-10-CM | POA: Diagnosis not present

## 2020-12-06 DIAGNOSIS — Z4789 Encounter for other orthopedic aftercare: Secondary | ICD-10-CM | POA: Diagnosis not present

## 2020-12-06 DIAGNOSIS — E119 Type 2 diabetes mellitus without complications: Secondary | ICD-10-CM | POA: Diagnosis not present

## 2020-12-06 DIAGNOSIS — M4722 Other spondylosis with radiculopathy, cervical region: Secondary | ICD-10-CM | POA: Diagnosis not present

## 2020-12-06 DIAGNOSIS — M4802 Spinal stenosis, cervical region: Secondary | ICD-10-CM | POA: Diagnosis not present

## 2020-12-06 DIAGNOSIS — Z9181 History of falling: Secondary | ICD-10-CM | POA: Diagnosis not present

## 2020-12-06 DIAGNOSIS — I10 Essential (primary) hypertension: Secondary | ICD-10-CM | POA: Diagnosis not present

## 2020-12-06 DIAGNOSIS — M4712 Other spondylosis with myelopathy, cervical region: Secondary | ICD-10-CM | POA: Diagnosis not present

## 2020-12-10 ENCOUNTER — Other Ambulatory Visit: Payer: Self-pay | Admitting: Family Medicine

## 2020-12-10 DIAGNOSIS — R3912 Poor urinary stream: Secondary | ICD-10-CM | POA: Diagnosis not present

## 2020-12-14 DIAGNOSIS — E119 Type 2 diabetes mellitus without complications: Secondary | ICD-10-CM | POA: Diagnosis not present

## 2020-12-14 DIAGNOSIS — M4802 Spinal stenosis, cervical region: Secondary | ICD-10-CM | POA: Diagnosis not present

## 2020-12-14 DIAGNOSIS — Z4789 Encounter for other orthopedic aftercare: Secondary | ICD-10-CM | POA: Diagnosis not present

## 2020-12-14 DIAGNOSIS — G250 Essential tremor: Secondary | ICD-10-CM | POA: Diagnosis not present

## 2020-12-14 DIAGNOSIS — Z9181 History of falling: Secondary | ICD-10-CM | POA: Diagnosis not present

## 2020-12-14 DIAGNOSIS — M4712 Other spondylosis with myelopathy, cervical region: Secondary | ICD-10-CM | POA: Diagnosis not present

## 2020-12-14 DIAGNOSIS — I10 Essential (primary) hypertension: Secondary | ICD-10-CM | POA: Diagnosis not present

## 2020-12-14 DIAGNOSIS — M4722 Other spondylosis with radiculopathy, cervical region: Secondary | ICD-10-CM | POA: Diagnosis not present

## 2020-12-14 DIAGNOSIS — Z981 Arthrodesis status: Secondary | ICD-10-CM | POA: Diagnosis not present

## 2020-12-14 DIAGNOSIS — Z7982 Long term (current) use of aspirin: Secondary | ICD-10-CM | POA: Diagnosis not present

## 2020-12-16 DIAGNOSIS — M4722 Other spondylosis with radiculopathy, cervical region: Secondary | ICD-10-CM | POA: Diagnosis not present

## 2020-12-16 DIAGNOSIS — Z7982 Long term (current) use of aspirin: Secondary | ICD-10-CM | POA: Diagnosis not present

## 2020-12-16 DIAGNOSIS — E119 Type 2 diabetes mellitus without complications: Secondary | ICD-10-CM | POA: Diagnosis not present

## 2020-12-16 DIAGNOSIS — M4802 Spinal stenosis, cervical region: Secondary | ICD-10-CM | POA: Diagnosis not present

## 2020-12-16 DIAGNOSIS — G250 Essential tremor: Secondary | ICD-10-CM | POA: Diagnosis not present

## 2020-12-16 DIAGNOSIS — I10 Essential (primary) hypertension: Secondary | ICD-10-CM | POA: Diagnosis not present

## 2020-12-16 DIAGNOSIS — Z981 Arthrodesis status: Secondary | ICD-10-CM | POA: Diagnosis not present

## 2020-12-16 DIAGNOSIS — Z4789 Encounter for other orthopedic aftercare: Secondary | ICD-10-CM | POA: Diagnosis not present

## 2020-12-16 DIAGNOSIS — Z9181 History of falling: Secondary | ICD-10-CM | POA: Diagnosis not present

## 2020-12-16 DIAGNOSIS — M4712 Other spondylosis with myelopathy, cervical region: Secondary | ICD-10-CM | POA: Diagnosis not present

## 2020-12-21 ENCOUNTER — Telehealth: Payer: Self-pay

## 2020-12-21 NOTE — Telephone Encounter (Signed)
Brendan [name] [facility] PT calls nurse line reporting a developing sacrum wound. Brendan [name] denies any concern for infection, no fever or chills. Brendan [name] is requesting a barrier cream to his pharmacy. Patient has an apt on 9/13 with PCP.    Brendan [name] requesting verbal orders for [facility] nursing. Verbal order given to [place].

## 2020-12-24 DIAGNOSIS — M4712 Other spondylosis with myelopathy, cervical region: Secondary | ICD-10-CM | POA: Diagnosis not present

## 2020-12-24 DIAGNOSIS — Z9181 History of falling: Secondary | ICD-10-CM | POA: Diagnosis not present

## 2020-12-24 DIAGNOSIS — Z981 Arthrodesis status: Secondary | ICD-10-CM | POA: Diagnosis not present

## 2020-12-24 DIAGNOSIS — I10 Essential (primary) hypertension: Secondary | ICD-10-CM | POA: Diagnosis not present

## 2020-12-24 DIAGNOSIS — M4802 Spinal stenosis, cervical region: Secondary | ICD-10-CM | POA: Diagnosis not present

## 2020-12-24 DIAGNOSIS — G250 Essential tremor: Secondary | ICD-10-CM | POA: Diagnosis not present

## 2020-12-24 DIAGNOSIS — Z7982 Long term (current) use of aspirin: Secondary | ICD-10-CM | POA: Diagnosis not present

## 2020-12-24 DIAGNOSIS — Z4789 Encounter for other orthopedic aftercare: Secondary | ICD-10-CM | POA: Diagnosis not present

## 2020-12-24 DIAGNOSIS — E119 Type 2 diabetes mellitus without complications: Secondary | ICD-10-CM | POA: Diagnosis not present

## 2020-12-24 DIAGNOSIS — M4722 Other spondylosis with radiculopathy, cervical region: Secondary | ICD-10-CM | POA: Diagnosis not present

## 2020-12-27 DIAGNOSIS — M5412 Radiculopathy, cervical region: Secondary | ICD-10-CM | POA: Diagnosis not present

## 2020-12-28 ENCOUNTER — Encounter: Payer: Self-pay | Admitting: Family Medicine

## 2020-12-28 ENCOUNTER — Ambulatory Visit (INDEPENDENT_AMBULATORY_CARE_PROVIDER_SITE_OTHER): Payer: PPO | Admitting: Family Medicine

## 2020-12-28 ENCOUNTER — Other Ambulatory Visit: Payer: Self-pay

## 2020-12-28 VITALS — BP 107/80 | HR 95 | Ht 66.0 in | Wt 177.2 lb

## 2020-12-28 DIAGNOSIS — Z23 Encounter for immunization: Secondary | ICD-10-CM | POA: Diagnosis not present

## 2020-12-28 DIAGNOSIS — R339 Retention of urine, unspecified: Secondary | ICD-10-CM

## 2020-12-28 DIAGNOSIS — E119 Type 2 diabetes mellitus without complications: Secondary | ICD-10-CM | POA: Diagnosis not present

## 2020-12-28 DIAGNOSIS — G629 Polyneuropathy, unspecified: Secondary | ICD-10-CM | POA: Diagnosis not present

## 2020-12-28 LAB — POCT GLYCOSYLATED HEMOGLOBIN (HGB A1C): HbA1c, POC (controlled diabetic range): 6.8 % (ref 0.0–7.0)

## 2020-12-28 MED ORDER — METHOCARBAMOL 750 MG PO TABS: 750.0000 mg | ORAL_TABLET | Freq: Two times a day (BID) | ORAL | 0 refills | Status: DC | PRN

## 2020-12-28 NOTE — Patient Instructions (Addendum)
Thank you for coming in today. It  was nice to see you today.   Your blood pressure is good. We also rechecked your A1c. I will notify you of results when available.   Lets plan for follow up in 3 months.   Consider calling the below for cath assistance while your wife is in the hospital. [email or web address] [address] [place], [place], [place] [address]  [phone]

## 2020-12-28 NOTE — Progress Notes (Signed)
    SUBJECTIVE:   CHIEF COMPLAINT / HPI:   Mr. Carew 80 yo M who presents with his daughter for the below concerns.   Urinary retention Hx of CVA and BPH. Caths 1x daily.  Usually done by his wife.  She is currently in the hospital ill.  Daughter is here today with home Supplies.  He has not fully voided in 2 days patient states that he did have a small amount of urine this morning.  Denies dysuria as well as abdominal discomfort.  Diabetes Current Regimen: Diet controlled Last A1c: 6.9 on 06/22/2020  Last Eye Exam: None documented. Needs to schedule Statin: Lipitor 40 mg   HM Planning for flu shot today.  PERTINENT  PMH / PSH: Essential tremor  OBJECTIVE:   BP 107/80   Pulse 95   Ht 5\' 6"  (1.676 m)   Wt 177 lb 3.2 oz (80.4 kg)   SpO2 98%   BMI 28.60 kg/m   Bladder Ultrasound: Performed with handheld ultrasound, 5.05cm x 7.95 cm  UOP: 450 mL Physical Exam Vitals reviewed.  Constitutional:      General: He is not in acute distress.    Appearance: He is not ill-appearing.  Cardiovascular:     Rate and Rhythm: Normal rate and regular rhythm.     Heart sounds: Normal heart sounds.  Pulmonary:     Effort: Pulmonary effort is normal.     Breath sounds: Normal breath sounds.  Abdominal:     General: Bowel sounds are normal. There is no distension.     Palpations: There is no mass.     Tenderness: There is no abdominal tenderness. There is no guarding.  Musculoskeletal:     Right lower leg: No edema.     Left lower leg: No edema.  Neurological:     Mental Status: He is alert. Mental status is at baseline.  Psychiatric:        Mood and Affect: Mood normal.   ASSESSMENT/PLAN:   Diabetes mellitus without complication (HCC) A1c 6.8 today Diet controlled. Continue current regimen.  - HgB A1c  Urinary retention Patient has a history of BPH as well as CVA.  Calves daily.  Wife who usually caths him is in the hospital currently.  Daughter here today needing father to be  cath.  Has not voided in 2 days.  Nurse initially unable to express urine.  This provider followed with a bedside ultrasound which revealed a distended bladder. RN attempted catheterization for a second time and was able to get 450 mL of urine output; appreciate her note. Urine appeared purulent today we will culture it and consider treatment for urinary tract infection if needed.  He is a patient at [facility] urology.  Gave daughter [facility] information for further follow-up if needed.  Also expressed that she can bring the patient back if unable to continue to cath at home or at urology office. - Urinalysis, dipstick only - Urine Culture  Need for immunization against influenza - Flu Vaccine QUAD High Dose(Fluad)   [facility], DO [facility]

## 2020-12-29 LAB — URINALYSIS, DIPSTICK ONLY
Bilirubin, UA: NEGATIVE
Glucose, UA: NEGATIVE
Ketones, UA: NEGATIVE
Nitrite, UA: POSITIVE — AB
Specific Gravity, UA: 1.014 (ref 1.005–1.030)
Urobilinogen, Ur: 0.2 mg/dL (ref 0.2–1.0)
pH, UA: 6 (ref 5.0–7.5)

## 2020-12-29 NOTE — Progress Notes (Signed)
*  Delay in documentation.   At approx 1415 during office visit on 9/13, this RN was asked to perform an in and out catheterization on patient. Sterile procedure performed, however, was unable to get urine out from procedure. Requested assistance from supervising attending (Dr. Owens Shark) who met resistance during procedure. Catheter was then removed. Patient denied pain or discomfort.   Patient was then bladder scanned by Dr. Janus Molder, which revealed urine in bladder. Per order from Dr. Janus Molder, attempted in and out catheter again. 2nd attempt was successful. Patient declined pain during procedure. Drained 450 ml of cloudy, dark urine. Toward the end of procedure, noted that urine had pus like appearance.   Assisted patient down from table and back to chair. UA and culture ordered by Dr. Janus Molder.   Talbot Grumbling, RN

## 2020-12-30 ENCOUNTER — Telehealth: Payer: Self-pay

## 2020-12-30 NOTE — Telephone Encounter (Signed)
Patient's daughter calls nurse line requesting results from A1C. Informed of results. Please advise if patient needs to adjust current diabetes regimen.   Daughter also asked about urine culture. Informed that we are still waiting on those results and we will call once we receive them.   Daughter appreciative.   Veronda Prude, RN

## 2020-12-31 DIAGNOSIS — N3000 Acute cystitis without hematuria: Secondary | ICD-10-CM | POA: Diagnosis not present

## 2020-12-31 DIAGNOSIS — R338 Other retention of urine: Secondary | ICD-10-CM | POA: Diagnosis not present

## 2021-01-02 LAB — URINE CULTURE

## 2021-01-03 DIAGNOSIS — Z4789 Encounter for other orthopedic aftercare: Secondary | ICD-10-CM | POA: Diagnosis not present

## 2021-01-03 DIAGNOSIS — I10 Essential (primary) hypertension: Secondary | ICD-10-CM | POA: Diagnosis not present

## 2021-01-03 DIAGNOSIS — M4802 Spinal stenosis, cervical region: Secondary | ICD-10-CM | POA: Diagnosis not present

## 2021-01-03 DIAGNOSIS — M4722 Other spondylosis with radiculopathy, cervical region: Secondary | ICD-10-CM | POA: Diagnosis not present

## 2021-01-03 DIAGNOSIS — Z9181 History of falling: Secondary | ICD-10-CM | POA: Diagnosis not present

## 2021-01-03 DIAGNOSIS — Z7982 Long term (current) use of aspirin: Secondary | ICD-10-CM | POA: Diagnosis not present

## 2021-01-03 DIAGNOSIS — G250 Essential tremor: Secondary | ICD-10-CM | POA: Diagnosis not present

## 2021-01-03 DIAGNOSIS — M4712 Other spondylosis with myelopathy, cervical region: Secondary | ICD-10-CM | POA: Diagnosis not present

## 2021-01-03 DIAGNOSIS — E119 Type 2 diabetes mellitus without complications: Secondary | ICD-10-CM | POA: Diagnosis not present

## 2021-01-03 DIAGNOSIS — Z981 Arthrodesis status: Secondary | ICD-10-CM | POA: Diagnosis not present

## 2021-01-04 ENCOUNTER — Telehealth: Payer: Self-pay | Admitting: Neurology

## 2021-01-04 ENCOUNTER — Telehealth: Payer: Self-pay | Admitting: Family Medicine

## 2021-01-04 DIAGNOSIS — N342 Other urethritis: Secondary | ICD-10-CM

## 2021-01-04 MED ORDER — CEPHALEXIN 500 MG PO CAPS: 500.0000 mg | ORAL_CAPSULE | Freq: Two times a day (BID) | ORAL | 0 refills | Status: AC

## 2021-01-04 NOTE — Telephone Encounter (Signed)
Previous phone note closed in error:   Patient's daughter Dois Davenport called and said the patient's tremors have returned and he needs to see Dr. Arbutus Leas sooner than later.   He is having trouble ambulating and doing basic things. He has a bilateral DBS.  Need Dr. Don Perking recommendations on scheduling.

## 2021-01-04 NOTE — Telephone Encounter (Signed)
I got patient sch for 01-26-21 with Tat and is on  wait list as well. Patient daughter would like to know what they need to do about the tremors until he is seen by Tat, she would like to speak to a nurse about this  please call

## 2021-01-04 NOTE — Telephone Encounter (Signed)
Patient's daughter Dois [name] called and said the patient's tremors have returned and he needs to see Dr. Arbutus Leas sooner than later.  He is having trouble ambulating and doing basic things. He has a bilateral DBS.

## 2021-01-04 NOTE — Telephone Encounter (Signed)
Left a voicemail for daughter Markus Daft) to call back. I am planning to treat her father for a urinary tract infection. Keflex sent to pharmacy. Take twice daily for a total of 5 days.   Lavonda Jumbo, DO 01/04/2021, 4:40 PM PGY-3, [facility]

## 2021-01-04 NOTE — Telephone Encounter (Signed)
Please see phone note encounter from today.   Lavonda Jumbo, DO 01/04/2021, 4:41 PM PGY-3, [facility]

## 2021-01-05 NOTE — Telephone Encounter (Signed)
Called patients daughter sandy and asked her to check device. She is calling me back

## 2021-01-05 NOTE — Telephone Encounter (Signed)
Patients daughter checked device and battery and both appear on and charged. Patients daughter [name] wanting to know if you can prescribe anything to help with the tremors until his scheduled appointment

## 2021-01-05 NOTE — Telephone Encounter (Signed)
Patient's daughter [name] returned call to [place].

## 2021-01-06 NOTE — Telephone Encounter (Signed)
Called patients daughter and gave her Dr. Iona Beard message. She understood and had no more questions just anxious to get he r dad in to see Dr. Arbutus Leas

## 2021-01-06 NOTE — Telephone Encounter (Signed)
Patient's daughter returns call to nurse line. Daughter reports that urologist started patient on Macrobid yesterday.   Please advise if patient should also take Keflex.   Veronda Prude, RN

## 2021-01-06 NOTE — Telephone Encounter (Signed)
Returned call to patient's daughter and discussed continuing macrobid and no need to start keflex. She is encouraging increased fluids. She voiced understanding and appreciation.  Lavonda Jumbo, DO 01/06/2021, 8:37 PM PGY-3, [facility]

## 2021-01-10 DIAGNOSIS — Z981 Arthrodesis status: Secondary | ICD-10-CM | POA: Diagnosis not present

## 2021-01-10 DIAGNOSIS — M4712 Other spondylosis with myelopathy, cervical region: Secondary | ICD-10-CM | POA: Diagnosis not present

## 2021-01-10 DIAGNOSIS — Z9181 History of falling: Secondary | ICD-10-CM | POA: Diagnosis not present

## 2021-01-10 DIAGNOSIS — Z4789 Encounter for other orthopedic aftercare: Secondary | ICD-10-CM | POA: Diagnosis not present

## 2021-01-10 DIAGNOSIS — E119 Type 2 diabetes mellitus without complications: Secondary | ICD-10-CM | POA: Diagnosis not present

## 2021-01-10 DIAGNOSIS — M4802 Spinal stenosis, cervical region: Secondary | ICD-10-CM | POA: Diagnosis not present

## 2021-01-10 DIAGNOSIS — Z7982 Long term (current) use of aspirin: Secondary | ICD-10-CM | POA: Diagnosis not present

## 2021-01-10 DIAGNOSIS — G250 Essential tremor: Secondary | ICD-10-CM | POA: Diagnosis not present

## 2021-01-10 DIAGNOSIS — M4722 Other spondylosis with radiculopathy, cervical region: Secondary | ICD-10-CM | POA: Diagnosis not present

## 2021-01-10 DIAGNOSIS — I10 Essential (primary) hypertension: Secondary | ICD-10-CM | POA: Diagnosis not present

## 2021-01-11 ENCOUNTER — Other Ambulatory Visit: Payer: Self-pay | Admitting: Family Medicine

## 2021-01-13 DIAGNOSIS — R339 Retention of urine, unspecified: Secondary | ICD-10-CM | POA: Diagnosis not present

## 2021-01-13 DIAGNOSIS — G8110 Spastic hemiplegia affecting unspecified side: Secondary | ICD-10-CM | POA: Diagnosis not present

## 2021-01-13 DIAGNOSIS — I619 Nontraumatic intracerebral hemorrhage, unspecified: Secondary | ICD-10-CM | POA: Diagnosis not present

## 2021-01-15 DIAGNOSIS — Z9181 History of falling: Secondary | ICD-10-CM | POA: Diagnosis not present

## 2021-01-15 DIAGNOSIS — I10 Essential (primary) hypertension: Secondary | ICD-10-CM | POA: Diagnosis not present

## 2021-01-15 DIAGNOSIS — M4802 Spinal stenosis, cervical region: Secondary | ICD-10-CM | POA: Diagnosis not present

## 2021-01-15 DIAGNOSIS — G250 Essential tremor: Secondary | ICD-10-CM | POA: Diagnosis not present

## 2021-01-15 DIAGNOSIS — E119 Type 2 diabetes mellitus without complications: Secondary | ICD-10-CM | POA: Diagnosis not present

## 2021-01-15 DIAGNOSIS — Z7982 Long term (current) use of aspirin: Secondary | ICD-10-CM | POA: Diagnosis not present

## 2021-01-15 DIAGNOSIS — Z981 Arthrodesis status: Secondary | ICD-10-CM | POA: Diagnosis not present

## 2021-01-15 DIAGNOSIS — M4712 Other spondylosis with myelopathy, cervical region: Secondary | ICD-10-CM | POA: Diagnosis not present

## 2021-01-15 DIAGNOSIS — Z4789 Encounter for other orthopedic aftercare: Secondary | ICD-10-CM | POA: Diagnosis not present

## 2021-01-15 DIAGNOSIS — M4722 Other spondylosis with radiculopathy, cervical region: Secondary | ICD-10-CM | POA: Diagnosis not present

## 2021-01-18 DIAGNOSIS — Z981 Arthrodesis status: Secondary | ICD-10-CM | POA: Diagnosis not present

## 2021-01-18 DIAGNOSIS — I10 Essential (primary) hypertension: Secondary | ICD-10-CM | POA: Diagnosis not present

## 2021-01-18 DIAGNOSIS — G250 Essential tremor: Secondary | ICD-10-CM | POA: Diagnosis not present

## 2021-01-18 DIAGNOSIS — Z7982 Long term (current) use of aspirin: Secondary | ICD-10-CM | POA: Diagnosis not present

## 2021-01-18 DIAGNOSIS — M4712 Other spondylosis with myelopathy, cervical region: Secondary | ICD-10-CM | POA: Diagnosis not present

## 2021-01-18 DIAGNOSIS — M4802 Spinal stenosis, cervical region: Secondary | ICD-10-CM | POA: Diagnosis not present

## 2021-01-18 DIAGNOSIS — Z4789 Encounter for other orthopedic aftercare: Secondary | ICD-10-CM | POA: Diagnosis not present

## 2021-01-18 DIAGNOSIS — M4722 Other spondylosis with radiculopathy, cervical region: Secondary | ICD-10-CM | POA: Diagnosis not present

## 2021-01-18 DIAGNOSIS — Z9181 History of falling: Secondary | ICD-10-CM | POA: Diagnosis not present

## 2021-01-18 DIAGNOSIS — E119 Type 2 diabetes mellitus without complications: Secondary | ICD-10-CM | POA: Diagnosis not present

## 2021-01-24 DIAGNOSIS — Z981 Arthrodesis status: Secondary | ICD-10-CM | POA: Diagnosis not present

## 2021-01-24 DIAGNOSIS — M4722 Other spondylosis with radiculopathy, cervical region: Secondary | ICD-10-CM | POA: Diagnosis not present

## 2021-01-24 DIAGNOSIS — Z7982 Long term (current) use of aspirin: Secondary | ICD-10-CM | POA: Diagnosis not present

## 2021-01-24 DIAGNOSIS — E119 Type 2 diabetes mellitus without complications: Secondary | ICD-10-CM | POA: Diagnosis not present

## 2021-01-24 DIAGNOSIS — Z9181 History of falling: Secondary | ICD-10-CM | POA: Diagnosis not present

## 2021-01-24 DIAGNOSIS — Z4789 Encounter for other orthopedic aftercare: Secondary | ICD-10-CM | POA: Diagnosis not present

## 2021-01-24 DIAGNOSIS — M4712 Other spondylosis with myelopathy, cervical region: Secondary | ICD-10-CM | POA: Diagnosis not present

## 2021-01-24 DIAGNOSIS — G250 Essential tremor: Secondary | ICD-10-CM | POA: Diagnosis not present

## 2021-01-24 DIAGNOSIS — M4802 Spinal stenosis, cervical region: Secondary | ICD-10-CM | POA: Diagnosis not present

## 2021-01-24 DIAGNOSIS — I10 Essential (primary) hypertension: Secondary | ICD-10-CM | POA: Diagnosis not present

## 2021-01-25 NOTE — Progress Notes (Signed)
Assessment/Plan:    1.   Essential Tremor             Patient is status post bilateral DBS to the bilateral VIM on May 04, 2017.  Patient had postoperative bleed on the left, with resultant hematoma causing paralysis on the right, which has since resolved.  Because of complications, the patient's IPG was not placed until July, 2019.    -Reworked his device today and tremor was improved on the left.    2.  History of myoclonus.             -This was related to gabapentin.  Resolved when gabapentin was discontinued.  Neurosurgery has restarted the gabapentin post cervical spine surgery.  Am concerned about that, both because of myoclonus and because of the fact that he has developed dementia.  They have a follow-up appointment with neurosurgery and talk to them about seeing if we could wean that down.   3.  dementia  -Wife taking full-time care of the patient now.  There is some concern about her memory as well, both from myself as well as the patient's daughter.  Long discussion with family about living situation as well as power of attorney.  -Understands that we do not want him driving.  We have discussed this previously and pt/wife present before but patient has nonetheless persisted driving, both his car as well as his tractor.  Concerned about patient and Probation officer.  4.  History of cervical myelopathy  -Patient just had cervical decompression with Dr. [name] on August 16.   5.  blepharospasm  -discussed botox but decided to hold on that for right now.  They can let me know if they want to proceed.    Subjective:   Brendan [name] was seen today, accompanied by his wife and daughter who supplements the history.  Records reviewed from previous.  Last visit, the patient was complaining about dizziness.  I contacted his cardiologist about the beta-blocker and it was discontinued.  Patient was also ataxic, and I thought that severe central canal stenosis in the cervical region  was playing a role.  He ended up having surgery with Dr. [name] on August 16.  Family did call me in September 20 stating that patient was having more tremor and trouble ambulating.  I asked them to check and see if the device was on, and they stated that it was and asked me if I could prescribe anything to help with the tremor.  I told them that I could not do that, and as I needed to see him first.  The following day, I did note in the chart that he was started on medication by urology for urinary tract infection.  He has completed the medication for the uti.  Daughter reports few falls since neck surgery.  With one, he slipped on the grass (was outside throwing rocks).  With the other, he was trying to get out of the car and fell backward.  On gabapentin 300 mg three times per day since f/u with Dr. [name].  Had not started the gabapentin yet with the fall.    Daughter tells me she has heard he has driven.   ALLERGIES:   Allergies  Allergen Reactions   Lisinopril Cough    CURRENT MEDICATIONS:  Outpatient Encounter Medications as of 01/26/2021  Medication Sig   albuterol (PROAIR HFA) 108 (90 Base) MCG/ACT inhaler INHALE 2 PUFFS INTO THE LUNGS 4 TIMES DAILY AS  NEEDED (Patient taking differently: Inhale 2 puffs into the lungs 4 (four) times daily as needed for shortness of breath.)   ALLERGY RELIEF 10 MG tablet TAKE 1 TABLET BY MOUTH DAILY   aspirin EC 81 MG tablet Take 81 mg by mouth daily.   atorvastatin (LIPITOR) 40 MG tablet TAKE 1 TABLET BY MOUTH DAILY (Patient taking differently: Take 40 mg by mouth daily.)   Cyanocobalamin (VITAMIN B 12) 500 MCG TABS Take 1,000 mcg by mouth daily.   fluticasone (FLONASE) 50 MCG/ACT nasal spray Place 2 sprays into both nostrils in the morning. (Patient taking differently: Place 1 spray into both nostrils daily as needed for allergies.)   gabapentin (NEURONTIN) 300 MG capsule Take 300 mg by mouth 3 (three) times daily.   HYDROcodone-acetaminophen  (NORCO/VICODIN) 5-325 MG tablet Take 1-2 tablets by mouth every 4 (four) hours as needed for moderate pain.   Hyprom-Naphaz-Polysorb-Zn Sulf (CLEAR EYES COMPLETE OP) Place 1 drop into both eyes daily as needed (dry eyes).   Melatonin 3 MG TABS Take 1 tablet (3 mg total) by mouth at bedtime.   methocarbamol (ROBAXIN) 750 MG tablet Take 1 tablet (750 mg total) by mouth 2 (two) times daily as needed for muscle spasms.   nitroGLYCERIN (NITROSTAT) 0.4 MG SL tablet Place 1 tablet (0.4 mg total) under the tongue every 5 (five) minutes as needed for chest pain. call 911 if chest pain not better (Patient taking differently: Place 0.4 mg under the tongue every 5 (five) minutes as needed for chest pain (and CALL 9-1-1, IF CHEST PAIN DOES NOT IMPROVE).)   omeprazole (PRILOSEC) 20 MG capsule TAKE 1 CAPSULE BY MOUTH DAILY AS NEEDED FOR ACID REFLUX (Patient taking differently: Take 20 mg by mouth daily.)   traZODone (DESYREL) 100 MG tablet TAKE 1 TABLET BY MOUTH EACH NIGHT AT BEDTIME   [DISCONTINUED] furosemide (LASIX) 20 MG tablet Take 1 tablet twice a week (Sunday, Thursday) (Patient not taking: No sig reported)   Facility-Administered Encounter Medications as of 01/26/2021  Medication   0.9 %  sodium chloride infusion   sodium chloride 0.9 % injection 3 mL     Objective:    PHYSICAL EXAMINATION:    VITALS:   Vitals:   01/26/21 1017  BP: (!) 146/58  Pulse: 78  SpO2: 98%  Weight: 163 lb 12.8 oz (74.3 kg)  Height: 5\' 7"  (1.702 m)     GEN:  The patient appears stated age and is in NAD. HEENT:  Normocephalic, atraumatic.  The mucous membranes are moist.   Neurological examination:  Orientation: The patient is alert and oriented to person and place.  He has trouble with details. Tone: Normal Cranial nerves: There is good facial symmetry. The speech is somewhat dysphasic, but he is clear. Soft palate rises symmetrically and there is no tongue deviation. Hearing is intact to conversational tone.   He does have intermittent blepharospasm. Motor: Strength is at least antigravity x4.  \ Coordination: No trouble with rapid alternating movements.  No decremation. Abnormal movements: No rest tremor.  No postural tremor.  He does have some intention tremor prior to programming, left greater than right.  He may have some degree of ataxic tremor as well.   I have reviewed and interpreted the following labs independently   Chemistry      Component Value Date/Time   NA 138 11/16/2020 1630   NA 143 02/04/2019 1534   K 3.8 11/16/2020 1630   CL 104 11/16/2020 1630   CO2 28 11/16/2020  1630   BUN 7 (L) 11/16/2020 1630   BUN 9 02/04/2019 1534   CREATININE 1.11 11/16/2020 1630   CREATININE 1.02 05/01/2016 1118      Component Value Date/Time   CALCIUM 9.2 11/16/2020 1630   ALKPHOS 159 (H) 05/06/2019 1304   AST 20 05/06/2019 1304   ALT 20 05/06/2019 1304   BILITOT 1.1 05/06/2019 1304     Lab Results  Component Value Date   VITAMINB12 255 07/26/2020    Total time spent on today's visit was 30 minutes, including both face-to-face time and nonface-to-face time.  Time included that spent on review of records (prior notes available to me/labs/imaging if pertinent), discussing treatment and goals, answering patient's questions and coordinating care.  This was independent of DBS time.  Cc:  Lavonda Jumbo, DO

## 2021-01-26 ENCOUNTER — Telehealth: Payer: Self-pay

## 2021-01-26 ENCOUNTER — Other Ambulatory Visit: Payer: Self-pay

## 2021-01-26 ENCOUNTER — Ambulatory Visit (INDEPENDENT_AMBULATORY_CARE_PROVIDER_SITE_OTHER): Payer: PPO | Admitting: Neurology

## 2021-01-26 VITALS — BP 146/58 | HR 78 | Ht 67.0 in | Wt 163.8 lb

## 2021-01-26 DIAGNOSIS — G250 Essential tremor: Secondary | ICD-10-CM | POA: Diagnosis not present

## 2021-01-26 DIAGNOSIS — F0280 Dementia in other diseases classified elsewhere without behavioral disturbance: Secondary | ICD-10-CM | POA: Diagnosis not present

## 2021-01-26 DIAGNOSIS — Z9689 Presence of other specified functional implants: Secondary | ICD-10-CM

## 2021-01-26 DIAGNOSIS — G309 Alzheimer's disease, unspecified: Secondary | ICD-10-CM

## 2021-01-26 NOTE — Telephone Encounter (Signed)
Tresa Endo from [facility] calling for PT verbal orders as follows:  1 time(s) weekly for 10 week(s),  Verbal orders given per [facility] protocol  Veronda Prude, RN

## 2021-01-26 NOTE — Procedures (Signed)
DBS Programming was performed.    Manufacturer of DBS device: Medtronic  Total time spent programming was 30 minutes.  Device was confirmed to be on.  Soft start was confirmed to be on.  Impedences were checked and were within normal limits.  Battery was checked and was determined to be functioning normally and not near the end of life.  Final settings were as follows:   Active Contacts Amplitude (V) PW (ms) Frequency (hz) Battery  Left Brain       08/07/19 1-0+ 3.3 90 160 2.97  06/09/20 1-0+ 3.3 90 160 2.93  07/26/20 1-0+ 4.0 90 150 2.92  01/26/21 1-0+ 4.1 90 160 2.89                       Right Brain       08/07/19 1-0+ 2.7 90 150 2.98  06/09/20 1-0+ 2.7 90 150 2.95  07/26/20 1-0+ 2.8 90 145 2.94  01/26/21 1-0+ 3.4 90 145 2.90

## 2021-01-31 DIAGNOSIS — Z4789 Encounter for other orthopedic aftercare: Secondary | ICD-10-CM | POA: Diagnosis not present

## 2021-01-31 DIAGNOSIS — Z7982 Long term (current) use of aspirin: Secondary | ICD-10-CM | POA: Diagnosis not present

## 2021-01-31 DIAGNOSIS — I10 Essential (primary) hypertension: Secondary | ICD-10-CM | POA: Diagnosis not present

## 2021-01-31 DIAGNOSIS — M4802 Spinal stenosis, cervical region: Secondary | ICD-10-CM | POA: Diagnosis not present

## 2021-01-31 DIAGNOSIS — Z981 Arthrodesis status: Secondary | ICD-10-CM | POA: Diagnosis not present

## 2021-01-31 DIAGNOSIS — Z9181 History of falling: Secondary | ICD-10-CM | POA: Diagnosis not present

## 2021-01-31 DIAGNOSIS — E119 Type 2 diabetes mellitus without complications: Secondary | ICD-10-CM | POA: Diagnosis not present

## 2021-01-31 DIAGNOSIS — M4722 Other spondylosis with radiculopathy, cervical region: Secondary | ICD-10-CM | POA: Diagnosis not present

## 2021-01-31 DIAGNOSIS — G250 Essential tremor: Secondary | ICD-10-CM | POA: Diagnosis not present

## 2021-01-31 DIAGNOSIS — M4712 Other spondylosis with myelopathy, cervical region: Secondary | ICD-10-CM | POA: Diagnosis not present

## 2021-02-02 ENCOUNTER — Other Ambulatory Visit: Payer: Self-pay | Admitting: Family Medicine

## 2021-02-02 DIAGNOSIS — Z4789 Encounter for other orthopedic aftercare: Secondary | ICD-10-CM | POA: Diagnosis not present

## 2021-02-02 DIAGNOSIS — M4722 Other spondylosis with radiculopathy, cervical region: Secondary | ICD-10-CM | POA: Diagnosis not present

## 2021-02-02 DIAGNOSIS — Z9181 History of falling: Secondary | ICD-10-CM | POA: Diagnosis not present

## 2021-02-02 DIAGNOSIS — M4712 Other spondylosis with myelopathy, cervical region: Secondary | ICD-10-CM | POA: Diagnosis not present

## 2021-02-02 DIAGNOSIS — G250 Essential tremor: Secondary | ICD-10-CM | POA: Diagnosis not present

## 2021-02-02 DIAGNOSIS — Z981 Arthrodesis status: Secondary | ICD-10-CM | POA: Diagnosis not present

## 2021-02-02 DIAGNOSIS — I10 Essential (primary) hypertension: Secondary | ICD-10-CM | POA: Diagnosis not present

## 2021-02-02 DIAGNOSIS — M4802 Spinal stenosis, cervical region: Secondary | ICD-10-CM | POA: Diagnosis not present

## 2021-02-02 DIAGNOSIS — E119 Type 2 diabetes mellitus without complications: Secondary | ICD-10-CM | POA: Diagnosis not present

## 2021-02-02 DIAGNOSIS — Z7982 Long term (current) use of aspirin: Secondary | ICD-10-CM | POA: Diagnosis not present

## 2021-02-02 DIAGNOSIS — G629 Polyneuropathy, unspecified: Secondary | ICD-10-CM

## 2021-02-11 DIAGNOSIS — Z9181 History of falling: Secondary | ICD-10-CM | POA: Diagnosis not present

## 2021-02-11 DIAGNOSIS — G250 Essential tremor: Secondary | ICD-10-CM | POA: Diagnosis not present

## 2021-02-11 DIAGNOSIS — I10 Essential (primary) hypertension: Secondary | ICD-10-CM | POA: Diagnosis not present

## 2021-02-11 DIAGNOSIS — M4802 Spinal stenosis, cervical region: Secondary | ICD-10-CM | POA: Diagnosis not present

## 2021-02-11 DIAGNOSIS — E119 Type 2 diabetes mellitus without complications: Secondary | ICD-10-CM | POA: Diagnosis not present

## 2021-02-11 DIAGNOSIS — Z79891 Long term (current) use of opiate analgesic: Secondary | ICD-10-CM | POA: Diagnosis not present

## 2021-02-11 DIAGNOSIS — M4722 Other spondylosis with radiculopathy, cervical region: Secondary | ICD-10-CM | POA: Diagnosis not present

## 2021-02-11 DIAGNOSIS — Z981 Arthrodesis status: Secondary | ICD-10-CM | POA: Diagnosis not present

## 2021-02-11 DIAGNOSIS — Z4800 Encounter for change or removal of nonsurgical wound dressing: Secondary | ICD-10-CM | POA: Diagnosis not present

## 2021-02-11 DIAGNOSIS — M4712 Other spondylosis with myelopathy, cervical region: Secondary | ICD-10-CM | POA: Diagnosis not present

## 2021-02-11 DIAGNOSIS — Z7982 Long term (current) use of aspirin: Secondary | ICD-10-CM | POA: Diagnosis not present

## 2021-02-11 DIAGNOSIS — L89152 Pressure ulcer of sacral region, stage 2: Secondary | ICD-10-CM | POA: Diagnosis not present

## 2021-02-14 DIAGNOSIS — M5412 Radiculopathy, cervical region: Secondary | ICD-10-CM | POA: Diagnosis not present

## 2021-02-16 DIAGNOSIS — I10 Essential (primary) hypertension: Secondary | ICD-10-CM | POA: Diagnosis not present

## 2021-02-16 DIAGNOSIS — M4802 Spinal stenosis, cervical region: Secondary | ICD-10-CM | POA: Diagnosis not present

## 2021-02-16 DIAGNOSIS — Z4800 Encounter for change or removal of nonsurgical wound dressing: Secondary | ICD-10-CM | POA: Diagnosis not present

## 2021-02-16 DIAGNOSIS — Z7982 Long term (current) use of aspirin: Secondary | ICD-10-CM | POA: Diagnosis not present

## 2021-02-16 DIAGNOSIS — G250 Essential tremor: Secondary | ICD-10-CM | POA: Diagnosis not present

## 2021-02-16 DIAGNOSIS — E119 Type 2 diabetes mellitus without complications: Secondary | ICD-10-CM | POA: Diagnosis not present

## 2021-02-16 DIAGNOSIS — Z79891 Long term (current) use of opiate analgesic: Secondary | ICD-10-CM | POA: Diagnosis not present

## 2021-02-16 DIAGNOSIS — M4712 Other spondylosis with myelopathy, cervical region: Secondary | ICD-10-CM | POA: Diagnosis not present

## 2021-02-16 DIAGNOSIS — Z981 Arthrodesis status: Secondary | ICD-10-CM | POA: Diagnosis not present

## 2021-02-16 DIAGNOSIS — M4722 Other spondylosis with radiculopathy, cervical region: Secondary | ICD-10-CM | POA: Diagnosis not present

## 2021-02-16 DIAGNOSIS — L89152 Pressure ulcer of sacral region, stage 2: Secondary | ICD-10-CM | POA: Diagnosis not present

## 2021-02-16 DIAGNOSIS — Z9181 History of falling: Secondary | ICD-10-CM | POA: Diagnosis not present

## 2021-02-18 DIAGNOSIS — E119 Type 2 diabetes mellitus without complications: Secondary | ICD-10-CM | POA: Diagnosis not present

## 2021-02-18 DIAGNOSIS — Z79891 Long term (current) use of opiate analgesic: Secondary | ICD-10-CM | POA: Diagnosis not present

## 2021-02-18 DIAGNOSIS — G250 Essential tremor: Secondary | ICD-10-CM | POA: Diagnosis not present

## 2021-02-18 DIAGNOSIS — M4712 Other spondylosis with myelopathy, cervical region: Secondary | ICD-10-CM | POA: Diagnosis not present

## 2021-02-18 DIAGNOSIS — M4722 Other spondylosis with radiculopathy, cervical region: Secondary | ICD-10-CM | POA: Diagnosis not present

## 2021-02-18 DIAGNOSIS — M4802 Spinal stenosis, cervical region: Secondary | ICD-10-CM | POA: Diagnosis not present

## 2021-02-18 DIAGNOSIS — Z9181 History of falling: Secondary | ICD-10-CM | POA: Diagnosis not present

## 2021-02-18 DIAGNOSIS — Z4800 Encounter for change or removal of nonsurgical wound dressing: Secondary | ICD-10-CM | POA: Diagnosis not present

## 2021-02-18 DIAGNOSIS — Z981 Arthrodesis status: Secondary | ICD-10-CM | POA: Diagnosis not present

## 2021-02-18 DIAGNOSIS — I10 Essential (primary) hypertension: Secondary | ICD-10-CM | POA: Diagnosis not present

## 2021-02-18 DIAGNOSIS — L89152 Pressure ulcer of sacral region, stage 2: Secondary | ICD-10-CM | POA: Diagnosis not present

## 2021-02-18 DIAGNOSIS — Z7982 Long term (current) use of aspirin: Secondary | ICD-10-CM | POA: Diagnosis not present

## 2021-02-22 DIAGNOSIS — Z79891 Long term (current) use of opiate analgesic: Secondary | ICD-10-CM | POA: Diagnosis not present

## 2021-02-22 DIAGNOSIS — I10 Essential (primary) hypertension: Secondary | ICD-10-CM | POA: Diagnosis not present

## 2021-02-22 DIAGNOSIS — M4712 Other spondylosis with myelopathy, cervical region: Secondary | ICD-10-CM | POA: Diagnosis not present

## 2021-02-22 DIAGNOSIS — G250 Essential tremor: Secondary | ICD-10-CM | POA: Diagnosis not present

## 2021-02-22 DIAGNOSIS — L89152 Pressure ulcer of sacral region, stage 2: Secondary | ICD-10-CM | POA: Diagnosis not present

## 2021-02-22 DIAGNOSIS — Z7982 Long term (current) use of aspirin: Secondary | ICD-10-CM | POA: Diagnosis not present

## 2021-02-22 DIAGNOSIS — M4722 Other spondylosis with radiculopathy, cervical region: Secondary | ICD-10-CM | POA: Diagnosis not present

## 2021-02-22 DIAGNOSIS — E119 Type 2 diabetes mellitus without complications: Secondary | ICD-10-CM | POA: Diagnosis not present

## 2021-02-22 DIAGNOSIS — Z4800 Encounter for change or removal of nonsurgical wound dressing: Secondary | ICD-10-CM | POA: Diagnosis not present

## 2021-02-22 DIAGNOSIS — M4802 Spinal stenosis, cervical region: Secondary | ICD-10-CM | POA: Diagnosis not present

## 2021-02-22 DIAGNOSIS — Z981 Arthrodesis status: Secondary | ICD-10-CM | POA: Diagnosis not present

## 2021-02-22 DIAGNOSIS — Z9181 History of falling: Secondary | ICD-10-CM | POA: Diagnosis not present

## 2021-02-25 ENCOUNTER — Telehealth: Payer: Self-pay | Admitting: Neurology

## 2021-02-25 NOTE — Telephone Encounter (Signed)
Pt stated that they already have a new walker coming , they will have it Monday.

## 2021-03-01 DIAGNOSIS — Z7982 Long term (current) use of aspirin: Secondary | ICD-10-CM | POA: Diagnosis not present

## 2021-03-01 DIAGNOSIS — G250 Essential tremor: Secondary | ICD-10-CM | POA: Diagnosis not present

## 2021-03-01 DIAGNOSIS — Z4800 Encounter for change or removal of nonsurgical wound dressing: Secondary | ICD-10-CM | POA: Diagnosis not present

## 2021-03-01 DIAGNOSIS — M4802 Spinal stenosis, cervical region: Secondary | ICD-10-CM | POA: Diagnosis not present

## 2021-03-01 DIAGNOSIS — Z79891 Long term (current) use of opiate analgesic: Secondary | ICD-10-CM | POA: Diagnosis not present

## 2021-03-01 DIAGNOSIS — L89152 Pressure ulcer of sacral region, stage 2: Secondary | ICD-10-CM | POA: Diagnosis not present

## 2021-03-01 DIAGNOSIS — E119 Type 2 diabetes mellitus without complications: Secondary | ICD-10-CM | POA: Diagnosis not present

## 2021-03-01 DIAGNOSIS — M4722 Other spondylosis with radiculopathy, cervical region: Secondary | ICD-10-CM | POA: Diagnosis not present

## 2021-03-01 DIAGNOSIS — I10 Essential (primary) hypertension: Secondary | ICD-10-CM | POA: Diagnosis not present

## 2021-03-01 DIAGNOSIS — Z9181 History of falling: Secondary | ICD-10-CM | POA: Diagnosis not present

## 2021-03-01 DIAGNOSIS — M4712 Other spondylosis with myelopathy, cervical region: Secondary | ICD-10-CM | POA: Diagnosis not present

## 2021-03-01 DIAGNOSIS — Z981 Arthrodesis status: Secondary | ICD-10-CM | POA: Diagnosis not present

## 2021-03-03 ENCOUNTER — Ambulatory Visit (INDEPENDENT_AMBULATORY_CARE_PROVIDER_SITE_OTHER)
Admission: RE | Admit: 2021-03-03 | Discharge: 2021-03-03 | Disposition: A | Discharge status: Home / Self Care | Payer: PPO | Source: Ambulatory Visit | Attending: Emergency Medicine | Admitting: Emergency Medicine

## 2021-03-03 ENCOUNTER — Other Ambulatory Visit: Payer: Self-pay

## 2021-03-03 DIAGNOSIS — M4712 Other spondylosis with myelopathy, cervical region: Secondary | ICD-10-CM | POA: Diagnosis not present

## 2021-03-03 DIAGNOSIS — L89152 Pressure ulcer of sacral region, stage 2: Secondary | ICD-10-CM | POA: Diagnosis not present

## 2021-03-03 DIAGNOSIS — Z79891 Long term (current) use of opiate analgesic: Secondary | ICD-10-CM | POA: Diagnosis not present

## 2021-03-03 DIAGNOSIS — E119 Type 2 diabetes mellitus without complications: Secondary | ICD-10-CM | POA: Diagnosis not present

## 2021-03-03 DIAGNOSIS — J61 Pneumoconiosis due to asbestos and other mineral fibers: Secondary | ICD-10-CM | POA: Diagnosis not present

## 2021-03-03 DIAGNOSIS — Z981 Arthrodesis status: Secondary | ICD-10-CM | POA: Diagnosis not present

## 2021-03-03 DIAGNOSIS — Z9181 History of falling: Secondary | ICD-10-CM | POA: Diagnosis not present

## 2021-03-03 DIAGNOSIS — M4722 Other spondylosis with radiculopathy, cervical region: Secondary | ICD-10-CM | POA: Diagnosis not present

## 2021-03-03 DIAGNOSIS — I700 Atherosclerosis of aorta: Secondary | ICD-10-CM | POA: Diagnosis not present

## 2021-03-03 DIAGNOSIS — I10 Essential (primary) hypertension: Secondary | ICD-10-CM | POA: Diagnosis not present

## 2021-03-03 DIAGNOSIS — G250 Essential tremor: Secondary | ICD-10-CM | POA: Diagnosis not present

## 2021-03-03 DIAGNOSIS — M4802 Spinal stenosis, cervical region: Secondary | ICD-10-CM | POA: Diagnosis not present

## 2021-03-03 DIAGNOSIS — Z7982 Long term (current) use of aspirin: Secondary | ICD-10-CM | POA: Diagnosis not present

## 2021-03-03 DIAGNOSIS — Z4800 Encounter for change or removal of nonsurgical wound dressing: Secondary | ICD-10-CM | POA: Diagnosis not present

## 2021-03-09 DIAGNOSIS — M4802 Spinal stenosis, cervical region: Secondary | ICD-10-CM | POA: Diagnosis not present

## 2021-03-09 DIAGNOSIS — E119 Type 2 diabetes mellitus without complications: Secondary | ICD-10-CM | POA: Diagnosis not present

## 2021-03-09 DIAGNOSIS — M4722 Other spondylosis with radiculopathy, cervical region: Secondary | ICD-10-CM | POA: Diagnosis not present

## 2021-03-09 DIAGNOSIS — M4712 Other spondylosis with myelopathy, cervical region: Secondary | ICD-10-CM | POA: Diagnosis not present

## 2021-03-09 DIAGNOSIS — Z7982 Long term (current) use of aspirin: Secondary | ICD-10-CM | POA: Diagnosis not present

## 2021-03-09 DIAGNOSIS — Z79891 Long term (current) use of opiate analgesic: Secondary | ICD-10-CM | POA: Diagnosis not present

## 2021-03-09 DIAGNOSIS — G250 Essential tremor: Secondary | ICD-10-CM | POA: Diagnosis not present

## 2021-03-09 DIAGNOSIS — L89152 Pressure ulcer of sacral region, stage 2: Secondary | ICD-10-CM | POA: Diagnosis not present

## 2021-03-09 DIAGNOSIS — I10 Essential (primary) hypertension: Secondary | ICD-10-CM | POA: Diagnosis not present

## 2021-03-09 DIAGNOSIS — Z9181 History of falling: Secondary | ICD-10-CM | POA: Diagnosis not present

## 2021-03-09 DIAGNOSIS — Z981 Arthrodesis status: Secondary | ICD-10-CM | POA: Diagnosis not present

## 2021-03-09 DIAGNOSIS — Z4800 Encounter for change or removal of nonsurgical wound dressing: Secondary | ICD-10-CM | POA: Diagnosis not present

## 2021-03-14 ENCOUNTER — Other Ambulatory Visit: Payer: Self-pay | Admitting: Family Medicine

## 2021-03-16 DIAGNOSIS — Z4800 Encounter for change or removal of nonsurgical wound dressing: Secondary | ICD-10-CM | POA: Diagnosis not present

## 2021-03-16 DIAGNOSIS — E119 Type 2 diabetes mellitus without complications: Secondary | ICD-10-CM | POA: Diagnosis not present

## 2021-03-16 DIAGNOSIS — Z9181 History of falling: Secondary | ICD-10-CM | POA: Diagnosis not present

## 2021-03-16 DIAGNOSIS — M4712 Other spondylosis with myelopathy, cervical region: Secondary | ICD-10-CM | POA: Diagnosis not present

## 2021-03-16 DIAGNOSIS — I10 Essential (primary) hypertension: Secondary | ICD-10-CM | POA: Diagnosis not present

## 2021-03-16 DIAGNOSIS — Z79891 Long term (current) use of opiate analgesic: Secondary | ICD-10-CM | POA: Diagnosis not present

## 2021-03-16 DIAGNOSIS — Z7982 Long term (current) use of aspirin: Secondary | ICD-10-CM | POA: Diagnosis not present

## 2021-03-16 DIAGNOSIS — M4722 Other spondylosis with radiculopathy, cervical region: Secondary | ICD-10-CM | POA: Diagnosis not present

## 2021-03-16 DIAGNOSIS — Z981 Arthrodesis status: Secondary | ICD-10-CM | POA: Diagnosis not present

## 2021-03-16 DIAGNOSIS — L89152 Pressure ulcer of sacral region, stage 2: Secondary | ICD-10-CM | POA: Diagnosis not present

## 2021-03-16 DIAGNOSIS — G250 Essential tremor: Secondary | ICD-10-CM | POA: Diagnosis not present

## 2021-03-16 DIAGNOSIS — M4802 Spinal stenosis, cervical region: Secondary | ICD-10-CM | POA: Diagnosis not present

## 2021-03-18 ENCOUNTER — Telehealth: Payer: Self-pay

## 2021-03-18 DIAGNOSIS — L89152 Pressure ulcer of sacral region, stage 2: Secondary | ICD-10-CM | POA: Diagnosis not present

## 2021-03-18 DIAGNOSIS — Z79891 Long term (current) use of opiate analgesic: Secondary | ICD-10-CM | POA: Diagnosis not present

## 2021-03-18 DIAGNOSIS — M4722 Other spondylosis with radiculopathy, cervical region: Secondary | ICD-10-CM | POA: Diagnosis not present

## 2021-03-18 DIAGNOSIS — I10 Essential (primary) hypertension: Secondary | ICD-10-CM | POA: Diagnosis not present

## 2021-03-18 DIAGNOSIS — Z981 Arthrodesis status: Secondary | ICD-10-CM | POA: Diagnosis not present

## 2021-03-18 DIAGNOSIS — G250 Essential tremor: Secondary | ICD-10-CM | POA: Diagnosis not present

## 2021-03-18 DIAGNOSIS — R21 Rash and other nonspecific skin eruption: Secondary | ICD-10-CM

## 2021-03-18 DIAGNOSIS — Z4800 Encounter for change or removal of nonsurgical wound dressing: Secondary | ICD-10-CM | POA: Diagnosis not present

## 2021-03-18 DIAGNOSIS — M4712 Other spondylosis with myelopathy, cervical region: Secondary | ICD-10-CM | POA: Diagnosis not present

## 2021-03-18 DIAGNOSIS — M4802 Spinal stenosis, cervical region: Secondary | ICD-10-CM | POA: Diagnosis not present

## 2021-03-18 DIAGNOSIS — E119 Type 2 diabetes mellitus without complications: Secondary | ICD-10-CM | POA: Diagnosis not present

## 2021-03-18 DIAGNOSIS — Z9181 History of falling: Secondary | ICD-10-CM | POA: Diagnosis not present

## 2021-03-18 DIAGNOSIS — Z7982 Long term (current) use of aspirin: Secondary | ICD-10-CM | POA: Diagnosis not present

## 2021-03-18 NOTE — Telephone Encounter (Signed)
Patient's HH RN, Clydie Braun, calls nurse line requesting rx for nystatin cream.   Reports that bottom is red and has "yeast like appearance". They have been using zinc oxide cream, however, not clearing up.   Please advise if rx can be sent to [facility].   Veronda Prude, RN

## 2021-03-21 MED ORDER — NYSTATIN 100000 UNIT/GM EX CREA: 1.0000 "application " | TOPICAL_CREAM | Freq: Two times a day (BID) | CUTANEOUS | 0 refills | Status: DC

## 2021-03-21 NOTE — Telephone Encounter (Signed)
Nystatin cream prescribed.   Lavonda Jumbo, DO 03/21/2021, 4:52 PM PGY-3, [facility]

## 2021-03-23 DIAGNOSIS — L89152 Pressure ulcer of sacral region, stage 2: Secondary | ICD-10-CM | POA: Diagnosis not present

## 2021-03-23 DIAGNOSIS — M4712 Other spondylosis with myelopathy, cervical region: Secondary | ICD-10-CM | POA: Diagnosis not present

## 2021-03-23 DIAGNOSIS — Z9181 History of falling: Secondary | ICD-10-CM | POA: Diagnosis not present

## 2021-03-23 DIAGNOSIS — Z79891 Long term (current) use of opiate analgesic: Secondary | ICD-10-CM | POA: Diagnosis not present

## 2021-03-23 DIAGNOSIS — G250 Essential tremor: Secondary | ICD-10-CM | POA: Diagnosis not present

## 2021-03-23 DIAGNOSIS — Z4800 Encounter for change or removal of nonsurgical wound dressing: Secondary | ICD-10-CM | POA: Diagnosis not present

## 2021-03-23 DIAGNOSIS — E119 Type 2 diabetes mellitus without complications: Secondary | ICD-10-CM | POA: Diagnosis not present

## 2021-03-23 DIAGNOSIS — Z7982 Long term (current) use of aspirin: Secondary | ICD-10-CM | POA: Diagnosis not present

## 2021-03-23 DIAGNOSIS — M4722 Other spondylosis with radiculopathy, cervical region: Secondary | ICD-10-CM | POA: Diagnosis not present

## 2021-03-23 DIAGNOSIS — M4802 Spinal stenosis, cervical region: Secondary | ICD-10-CM | POA: Diagnosis not present

## 2021-03-23 DIAGNOSIS — I10 Essential (primary) hypertension: Secondary | ICD-10-CM | POA: Diagnosis not present

## 2021-03-23 DIAGNOSIS — Z981 Arthrodesis status: Secondary | ICD-10-CM | POA: Diagnosis not present

## 2021-03-26 ENCOUNTER — Other Ambulatory Visit: Payer: Self-pay | Admitting: Family Medicine

## 2021-03-28 ENCOUNTER — Other Ambulatory Visit: Payer: Self-pay | Admitting: Family Medicine

## 2021-04-21 ENCOUNTER — Other Ambulatory Visit: Payer: Self-pay | Admitting: Family Medicine

## 2021-05-20 ENCOUNTER — Other Ambulatory Visit: Payer: Self-pay | Admitting: Family Medicine

## 2021-05-20 DIAGNOSIS — K219 Gastro-esophageal reflux disease without esophagitis: Secondary | ICD-10-CM

## 2021-05-23 ENCOUNTER — Other Ambulatory Visit: Payer: Self-pay | Admitting: Family Medicine

## 2021-05-23 DIAGNOSIS — G629 Polyneuropathy, unspecified: Secondary | ICD-10-CM

## 2021-06-01 IMAGING — MR MR HEAD W/O CM
9 of 10 series · 37 of 48 positions shown · non-contrast
Comparison: 05/17/2017.

CLINICAL DATA: Ataxia, nontraumatic.

EXAM:
MRI HEAD WITHOUT CONTRAST
TECHNIQUE: Multiplanar, multiecho pulse sequences of the brain and surrounding
structures were obtained without intravenous contrast.

[Series 4: DWI · axial · 3.0mm · 1.09mm/px · z∈[-95,+84]mm · 9 of 122 slices shown (1 of 4)]
[im 1/122]
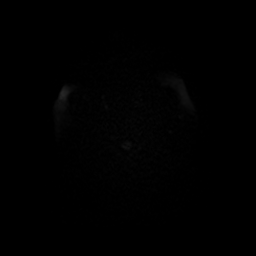
[im 23/122]
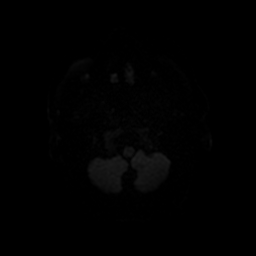
[im 34/122]
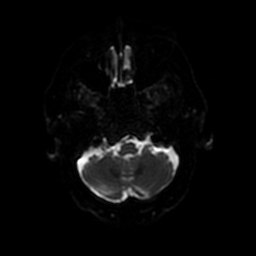
[im 56/122]
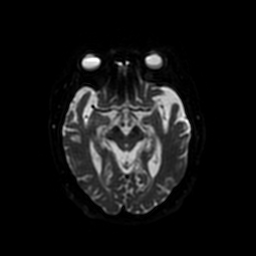
[im 67/122]
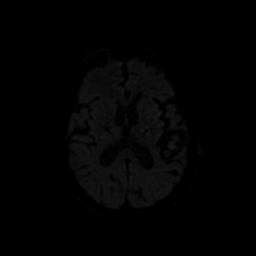
[im 89/122]
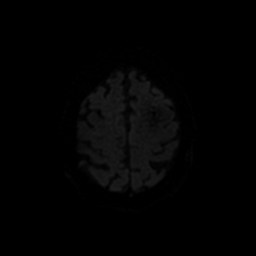
[im 100/122]
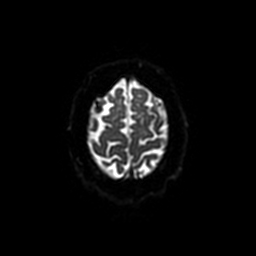
[im 111/122]
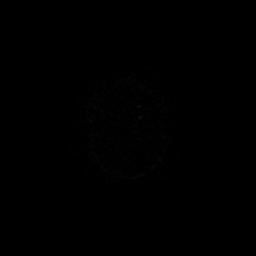
[im 122/122]
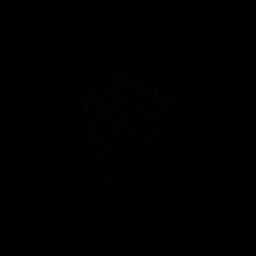

[Series 5: DWI · coronal · 5.0mm · 1.09mm/px · 8 of 90 slices shown (2 of 4)]
[im 1/90]
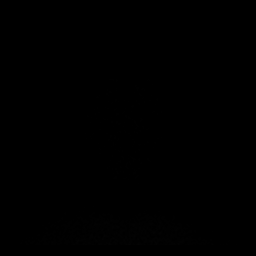
[im 13/90]
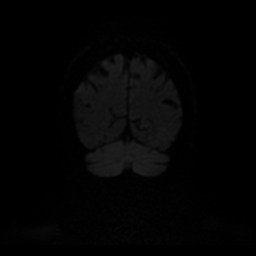
[im 26/90]
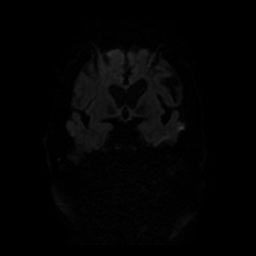
[im 39/90]
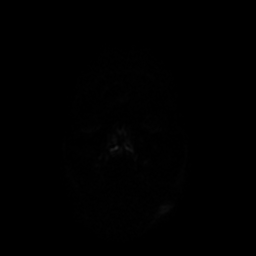
[im 51/90]
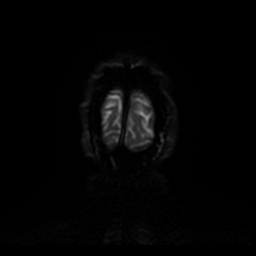
[im 64/90]
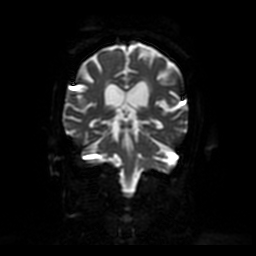
[im 77/90]
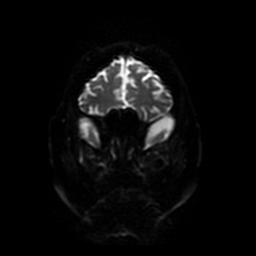
[im 90/90]
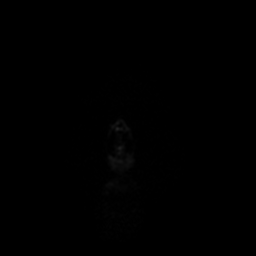

[Series 6: T2 · axial · 5.0mm · 0.45mm/px · z∈[-84,+72]mm · 2 of 27 slices shown (1 of 2)]
[im 1/27]
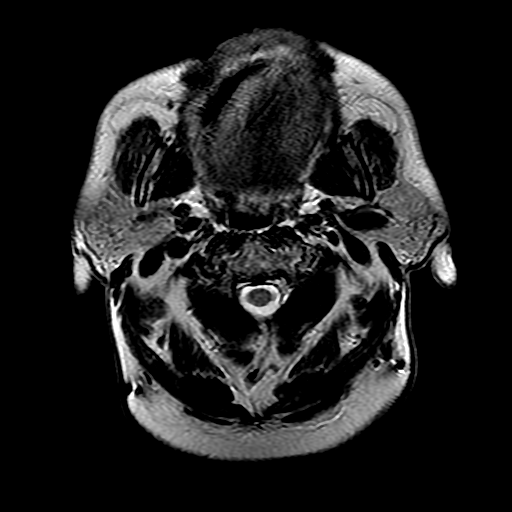
[im 27/27]
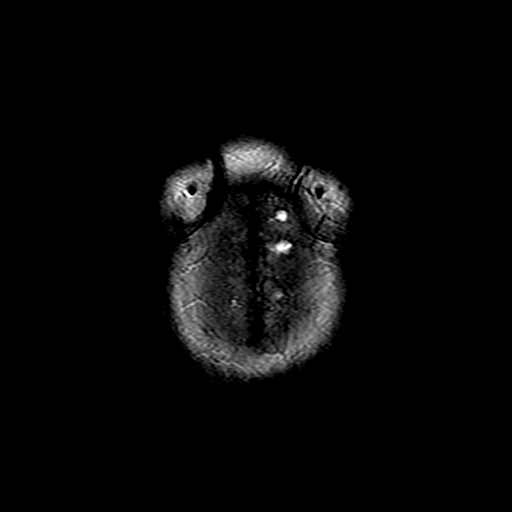

[Series 7: T1 · sagittal · 5.0mm · 0.49mm/px · 2 of 23 slices shown (1 of 2)]
[im 1/23]
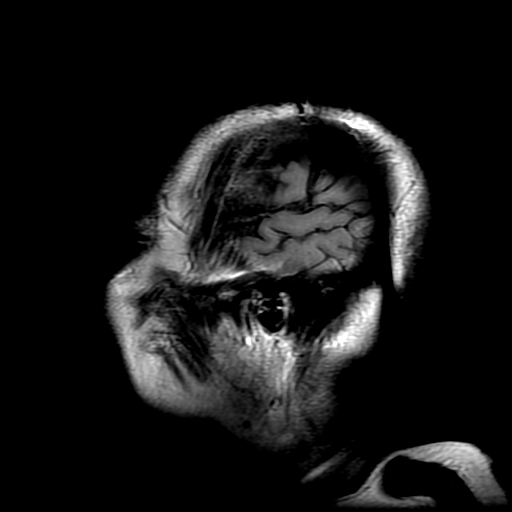
[im 23/23]
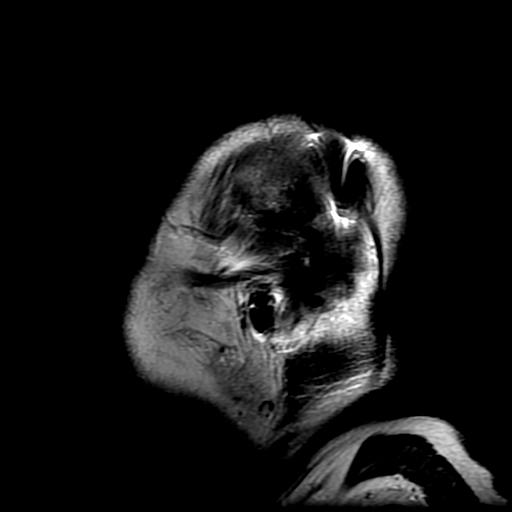

[Series 8: FLAIR · axial · 5.0mm · 0.45mm/px · z∈[-84,+72]mm · 2 of 27 slices shown]
[im 1/27]
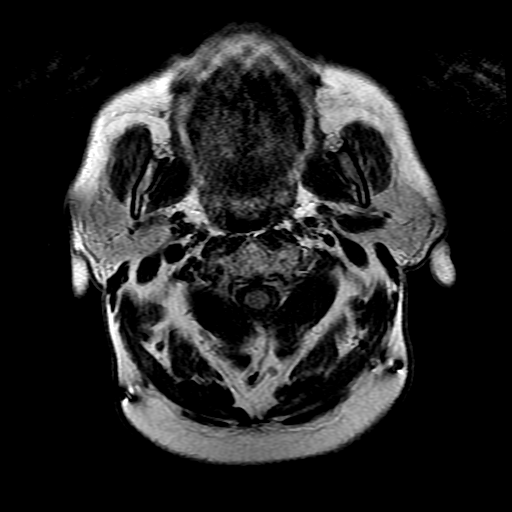
[im 27/27]
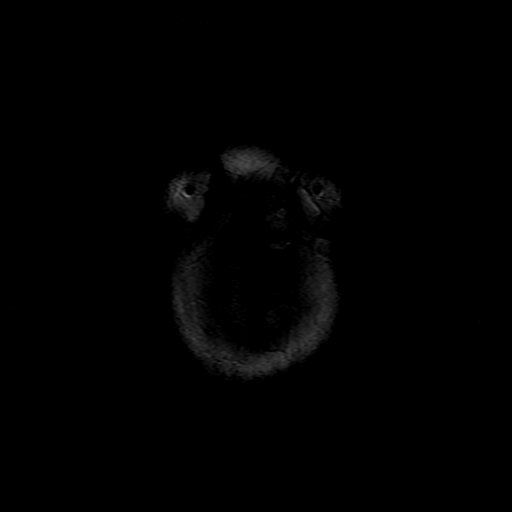

[Series 10: T1 · axial · 3.0mm · 0.45mm/px · z∈[-85,-46]mm · 3 of 108 slices shown (2 of 2)]
[im 1/108]
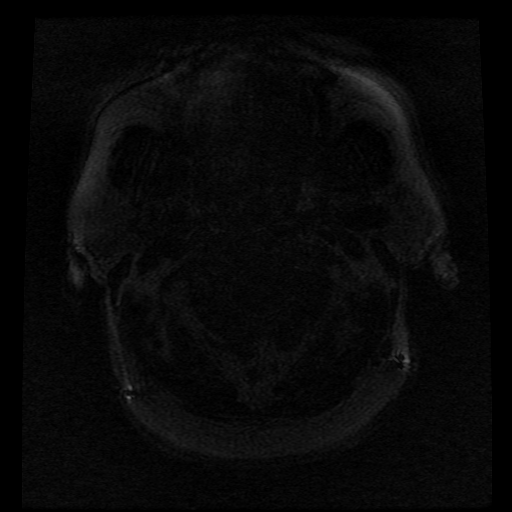
[im 14/108]
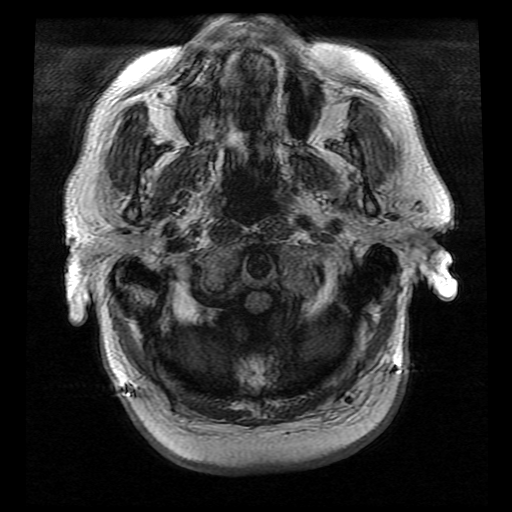
[im 27/108]
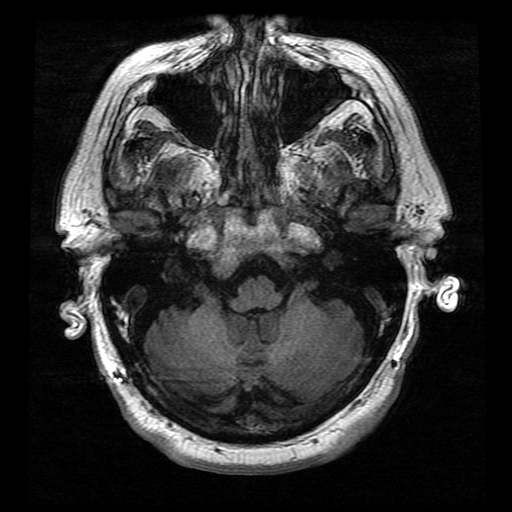

[Series 11: T2 · coronal · 5.0mm · 0.39mm/px · 2 of 26 slices shown (2 of 2)]
[im 1/26]
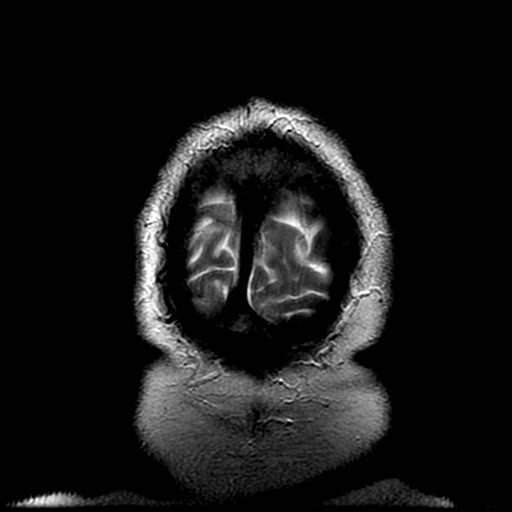
[im 26/26]
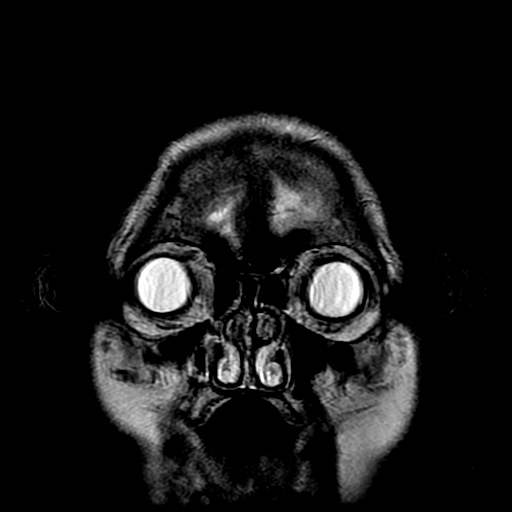

[Series 400: DWI · axial · 3.0mm · 1.09mm/px · z∈[-95,+84]mm · 5 of 61 slices shown (3 of 4)]
[im 1/61]
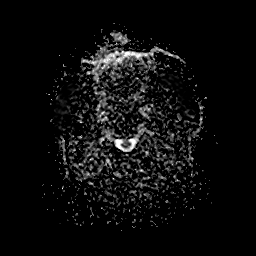
[im 16/61]
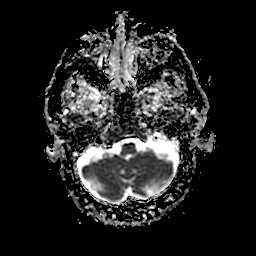
[im 31/61]
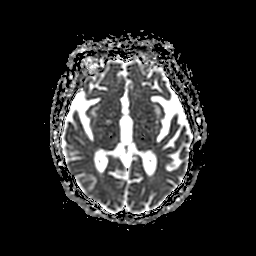
[im 46/61]
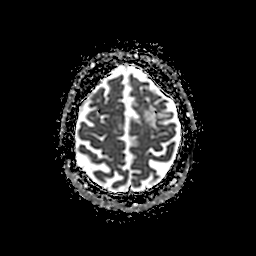
[im 61/61]
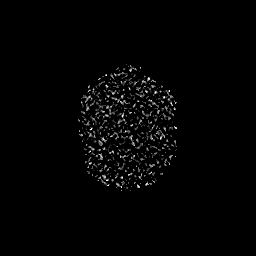

[Series 500: DWI · coronal · 5.0mm · 1.09mm/px · 4 of 45 slices shown (4 of 4)]
[im 1/45]
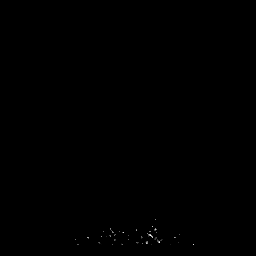
[im 15/45]
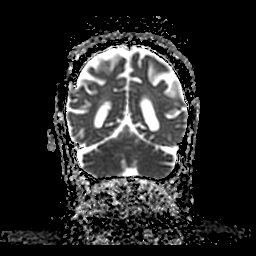
[im 30/45]
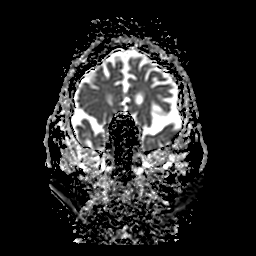
[im 45/45]
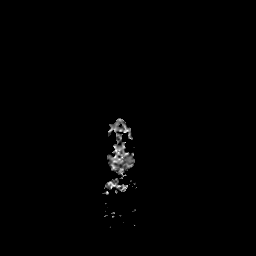

[37 of 48 positions shown; findings below may reference images not displayed]

FINDINGS: Brain: Bilateral deep brain stimulator electrodes with the tips in
the region of the subthalamic nuclei bilaterally. Sequela of prior
hemorrhage in the left frontal lobe along the proximal electrode
with encephalomalacia/gliosis and hemosiderin in this region. No
evidence of acute hemorrhage. No acute infarct. Generalized cerebral
atrophy with ex vacuo ventricular dilation, similar to prior. No
hydrocephalus. No mass lesion or abnormal mass effect. Additional
mild for age scattered T2/FLAIR hyperintensities within the white
matter, likely the sequela of chronic microvascular ischemic
disease.

Vascular: Major arterial flow voids are maintained at the skull
base.

Skull and upper cervical spine: Normal marrow signal. Cervical spine
is better evaluated on concurrent MRI cervical spine.

Sinuses/Orbits: Clear sinuses.  Unremarkable orbits.

Other: No mastoid effusions.
IMPRESSION: 1. No evidence of acute intracranial abnormality.
2. Sequela of prior hemorrhage in the left frontal lobe.
3. Bilateral deep brain stimulator electrodes.
4. Mild for age chronic microvascular ischemic disease and moderate
atrophy.

## 2021-06-07 ENCOUNTER — Other Ambulatory Visit: Payer: Self-pay | Admitting: Family Medicine

## 2021-06-07 DIAGNOSIS — R21 Rash and other nonspecific skin eruption: Secondary | ICD-10-CM

## 2021-06-08 ENCOUNTER — Encounter: Payer: PPO | Admitting: Neurology

## 2021-06-13 ENCOUNTER — Encounter: Payer: Self-pay | Admitting: Cardiology

## 2021-06-13 ENCOUNTER — Ambulatory Visit: Payer: PPO | Admitting: Cardiology

## 2021-06-13 ENCOUNTER — Other Ambulatory Visit: Payer: Self-pay

## 2021-06-13 VITALS — BP 140/61 | HR 80 | Ht 67.0 in | Wt 180.8 lb

## 2021-06-13 DIAGNOSIS — G250 Essential tremor: Secondary | ICD-10-CM

## 2021-06-13 DIAGNOSIS — Z951 Presence of aortocoronary bypass graft: Secondary | ICD-10-CM | POA: Diagnosis not present

## 2021-06-13 DIAGNOSIS — R600 Localized edema: Secondary | ICD-10-CM

## 2021-06-13 DIAGNOSIS — I2510 Atherosclerotic heart disease of native coronary artery without angina pectoris: Secondary | ICD-10-CM

## 2021-06-13 DIAGNOSIS — I25709 Atherosclerosis of coronary artery bypass graft(s), unspecified, with unspecified angina pectoris: Secondary | ICD-10-CM | POA: Diagnosis not present

## 2021-06-13 DIAGNOSIS — I952 Hypotension due to drugs: Secondary | ICD-10-CM

## 2021-06-13 DIAGNOSIS — E785 Hyperlipidemia, unspecified: Secondary | ICD-10-CM | POA: Diagnosis not present

## 2021-06-13 DIAGNOSIS — I10 Essential (primary) hypertension: Secondary | ICD-10-CM

## 2021-06-13 MED ORDER — ATORVASTATIN CALCIUM 20 MG PO TABS: 20.0000 mg | ORAL_TABLET | Freq: Every day | ORAL | 3 refills | Status: DC

## 2021-06-13 NOTE — Patient Instructions (Addendum)
Medication Instructions:   Decrease to 1/2 tablet of Atorvastatin 40 mg  ( equal 20  mg )  daily  --- changed to Atorvastatin 20 mg daily    *If you need a refill on your cardiac medications before your next appointment, please call your pharmacy*   Lab Work: CMP LIPID  fasting -  both in 1 to 2 months   If you have labs (blood work) drawn today and your tests are completely normal, you will receive your results only by: MyChart Message (if you have MyChart) OR A paper copy in the mail If you have any lab test that is abnormal or we need to change your treatment, we will call you to review the results.   Testing/Procedures:  Not needed  Follow-Up: At [facility], you and your health needs are our priority.  As part of our continuing mission to provide you with exceptional heart care, we have created designated Provider Care Teams.  These Care Teams include your primary Cardiologist (physician) and Advanced Practice Providers (APPs -  Physician Assistants and Nurse Practitioners) who all work together to provide you with the care you need, when you need it.     Your next appointment:   12 month(s)  The format for your next appointment:   In Person  Provider:   Bryan Lemma, MD

## 2021-06-13 NOTE — Progress Notes (Signed)
Primary Care Provider: Lavonda Jumbo, DO Cardiologist: Bryan Lemma, MD Electrophysiologist: None  Clinic Note: Chief Complaint  Patient presents with   Follow-up    No major cardiac issues.   Coronary Artery Disease    No angina or heart failure    ===================================  ASSESSMENT/PLAN   Problem List Items Addressed This Visit       Cardiology Problems   Coronary artery disease involving coronary bypass graft of native heart with angina pectoris (HCC) (Chronic)    2 of 3 grafts patent with occluded SVG having diagonal branch.  No active anginal symptoms.  Plan: Continue with aspirin, reduced dose of atorvastatin.   Not on beta-blocker or ACE inhibitor/ARB for low blood pressure issues and fatigue  PRN simply nitroglycerin.  Not requiring..      Relevant Medications   atorvastatin (LIPITOR) 20 MG tablet   Other Relevant Orders   EKG 12-Lead (Completed)   Lipid panel (Completed)   Comprehensive metabolic panel (Completed)   Atherosclerotic heart disease of native coronary artery without angina pectoris (Chronic)    He had a LAD bifurcation lesion noted back in 2004, referred for three-vessel CABG.  Graft to the diagonal is occluded.  LIMA to LAD and SVG-OM still patent.  No active angina symptoms.  In light of him not having ongoing symptoms, we probably not going to continue with surveillance stress test.  Very difficult for him to undergo this test.  Difficult because of tremor making images poor quality.  Plan for now to continue with low-dose statin along with aspirin.  Unless he has concerning symptoms, would not consider ischemic evaluation      Relevant Medications   atorvastatin (LIPITOR) 20 MG tablet   Essential hypertension (Chronic)    Borderline blood pressure today, but we are allowing for mild permissive hypertension to avoid dizziness.  He has had issues with orthostatic hypotension especially with his Parkinson's.  He was  having issues with low BP levels while on ACE inhibitor.  We have now stopped that and allowing for permissive hypertension.  We will continue with no medications.      Relevant Medications   atorvastatin (LIPITOR) 20 MG tablet   Hyperlipidemia LDL goal <70 - Primary (Chronic)    Last lipids were from March 2022.  We need to recheck labs, but I think were probably okay taking 20 mg of atorvastatin.  There was a plan last visit.  Plan: Reduce atorvastatin to 20 mg daily.  Would tolerate higher levels of lipids if they do go up as this may help his fatigue and memory issues..      Relevant Medications   atorvastatin (LIPITOR) 20 MG tablet   Other Relevant Orders   Lipid panel (Completed)   Comprehensive metabolic panel (Completed)   Hypotension due to drugs    Did not tolerate being on ARB/ACE I because of hypotension.  Pressures are better off of medications with less dizziness and fatigue.  Plan continue to allow for permissive hypertension.  As long as does not have significant elevated levels, would not treat moderate elevation in blood pressure.  (As long as his blood pressures are nonsustained greater than 160 mmHg, we are okay)      Relevant Medications   atorvastatin (LIPITOR) 20 MG tablet     Other   S/P CABG x 3 (Chronic)   Relevant Orders   EKG 12-Lead (Completed)   Lipid panel (Completed)   Comprehensive metabolic panel (Completed)   Lower extremity edema (  Chronic)    No longer taking Lasix.  Simply recommend foot elevation and graduated compression stockings if able to wear them.  Swelling is more consistent with venous stasis than CHF.      Tremor, essential   Relevant Orders   Comprehensive metabolic panel (Completed)    ===================================  HPI:    Brendan [name] is a 81 y.o. male with a PMH notable for CAD-CABG x3 (1997-known occluded SVG-diagonal with patent LIMA-LAD and SVG-OM), HTN, HLD along with progressive Parkinson's disease who  presents today for annual follow-up. CV History 1997 LAD diagonal bifurcation disease-CABG x3 June 2013-Unstable Angina -> catheter revealed occluded SVG-diagonal.  Patent LIMA-LAD SVG-OM.  Medical management.  (Chronic stable angina). Myoview June 2017: No ischemia or infarction. Status post deep brain stimulator for essential tremor/PD  Brendan [name] was last seen on June 11, 2020 for delayed follow-up.  He was doing pretty well from a car standpoint.  Able to walk to the mailbox and back without him or stop because of dyspnea.  Just slow shuffling gait with poor balance.  No chest pain or pressure with the amount of exertion that he is able to do.  No PND, orthopnea edema.  Just fatigue because of how much effort it takes to walk.  Recent Hospitalizations:  11/30/2020: C-spine stenosis with myelopathy (C3-4 and C4-5 => s/p decompression and fusion  Reviewed  CV studies:    The following studies were reviewed today: (if available, images/films reviewed: From Epic Chart or Care Everywhere) None:  Interval History:   Brendan [name] returns here for annual follow-up accompanied by his wife.  Seems to be doing pretty well from a cardiac standpoint.  He does have issues with his tremor getting worse and is bothered by knee pains and back pain as well as C-spine pain.  He did pretty well after C-spine surgery but has really noted that much benefit from neurostimulator.   He really is not all that active in the extent that he does walk maybe to the mailbox and back, he usually has to stop just to come and catch his breath but no chest pain or pressure.  It takes a lot of effort for him to walk that distance because of his shuffling unsteady gait.  Very tiresome, but able to hold his weight standing short-term.  He denies any chest pain or pressure at rest or with the 9 exertion he is able to do.  Exertional dyspnea stable.  No irregular heartbeats or palpitations.  No syncope or near syncope.   No TIA or amaurosis fugax.  He does not walk enough to note claudication   REVIEWED OF SYSTEMS   Review of Systems  Constitutional:  Positive for malaise/fatigue. Negative for weight loss.  HENT:  Negative for nosebleeds.   Respiratory:  Negative for cough, shortness of breath and wheezing.   Cardiovascular:  Negative for leg swelling.  Gastrointestinal:  Positive for constipation. Negative for blood in stool and melena.  Genitourinary:  Negative for flank pain and hematuria.  Musculoskeletal:  Positive for joint pain (Both knees) and myalgias (Legs ache up and down the entire leg.  Mostly in the front.).  Neurological:  Positive for weakness (Legs are weak). Negative for dizziness, focal weakness and loss of consciousness.  Psychiatric/Behavioral:  Positive for memory loss. Negative for depression (Mood seems to be okay.  Pretty stable.). The patient has insomnia. The patient is not nervous/anxious.    I have reviewed and (if needed) personally updated  the patient's problem list, medications, allergies, past medical and surgical history, social and family history.   PAST MEDICAL HISTORY   Past Medical History:  Diagnosis Date   Anxiety disorder    Benign enlargement of prostate    CAD in native artery 1997   Referred for CABG x3 in 2004 for LAD diagonal bifurcation lesion   Cervical spinal stenosis    Complex regional pain syndrome of right upper extremity    Right wrist; L arm   Coronary atherosclerosis of artery bypass graft 09/2011   Occluded SVG-D1; Cardiologist Dr. Herbie Baltimore   Gait disorder    Gastroesophageal reflux disease    Glaucoma    Hiatal hernia    GI: Dr Evette Cristal   Hyperlipidemia LDL goal <70    Hypertension    Memory loss    Mild   Obesity    Peripheral neuropathy    possible peripheral neuropathy   Pre-diabetes    S/P CABG x 3 2004    LIMA-LAD, SVG to diagonal, SVG to OM   Tremor, essential    On Primidone   Unstable angina pectoris (HCC) 09/2011   Cardiac  cath: occluded SVG-DI. Patent LIMA-LAD and SVG-OM. EF 45% with apical inferior HK.    PAST SURGICAL HISTORY   Past Surgical History:  Procedure Laterality Date   ANTERIOR CERVICAL DECOMP/DISCECTOMY FUSION N/A 11/30/2020   Procedure: Cervical three-four, Cervical four-fve  Anterior cervical decompression/discectomy/fusion;  Surgeon: Maeola Harman, MD;  Location: [facility] OR;  Service: Neurosurgery;  Laterality: N/A;   CATARACT EXTRACTION, BILATERAL     CORONARY ARTERY BYPASS GRAFT  06/2002   LIMA-LAD, SVG-D1, SVG-OM   EYE SURGERY     KNEE SURGERY Left    LEFT HEART CATH AND CORONARY ANGIOGRAPHY  1997; 2004   In 2004 was referred for CABG due to a LAD-bifurcation lesion   LEFT HEART CATHETERIZATION WITH CORONARY/GRAFT ANGIOGRAM N/A 10/13/2011   Procedure: LEFT HEART CATHETERIZATION WITH Isabel Caprice;  Surgeon: Marykay Lex, MD;  Location: [facility] CATH LAB;;  occluded SVG-DI. Patent LIMA-LAD and SVG-OM. EF 45% with apical inferior HK.;   MINOR PLACEMENT OF FIDUCIAL N/A 04/26/2017   Procedure: Fiducial placement;  Surgeon: Maeola Harman, MD;  Location: [facility] OR;  Service: Neurosurgery;  Laterality: N/A;  Fiducial placement   NM MYOCAR PERF WALL MOTION  03/23/2009   protocol:Bruce, normal perfusion in all regions, post-stress EF 72%, exercise capacity , EKG negative for ischemia.   PULSE GENERATOR IMPLANT Bilateral 11/13/2017   Procedure: Bilateral Implantable pulse generator;  Surgeon: Maeola Harman, MD;  Location: [facility] OR;  Service: Neurosurgery;  Laterality: Bilateral;  Bilateral Implantable pulse generator   Shoulder Orthoscopic Surgery   06/2003   SUBTHALAMIC STIMULATOR INSERTION Bilateral 05/04/2017   Procedure: Bilateral Deep brain stimulator placement;  Surgeon: Maeola Harman, MD;  Location: [facility] OR;  Service: Neurosurgery;  Laterality: Bilateral;  Bilateral deep brain stimulator placement   TRANSTHORACIC ECHOCARDIOGRAM  04/2017   Jan 2019: Normal LV size and function.  EF 55-60%.  No or W  MA.  Mild LA dilation.   wrist ganglion cyst Left     Immunization History  Administered Date(s) Administered   Fluad Quad(high Dose 65+) 01/03/2019, 02/04/2020, 12/28/2020   Influenza Split 01/11/2011, 01/02/2012   Influenza Whole 02/07/2008, 01/29/2009, 01/18/2010   Influenza,inj,Quad PF,6+ Mos 01/31/2013, 01/12/2014, 01/06/2015, 01/05/2016, 01/10/2017, 12/31/2017   PFIZER(Purple Top)SARS-COV-2 Vaccination 07/12/2019, 08/05/2019, 03/30/2020   Pneumococcal Conjugate-13 11/30/2015   Pneumococcal Polysaccharide-23 11/15/1996, 02/27/2011, 05/08/2017   Td 05/18/2002   Tdap 04/23/2018  Zoster, Live 08/10/2011    MEDICATIONS/ALLERGIES   Current Meds  Medication Sig   albuterol (VENTOLIN HFA) 108 (90 Base) MCG/ACT inhaler Inhale 2 puffs into the lungs 4 (four) times daily as needed for shortness of breath.   ALLERGY RELIEF 10 MG tablet TAKE 1 TABLET BY MOUTH DAILY   aspirin EC 81 MG tablet Take 81 mg by mouth daily.   atorvastatin (LIPITOR) 40 MG tablet TAKE 1 TABLET BY MOUTH DAILY   Cyanocobalamin (VITAMIN B 12) 500 MCG TABS Take 1,000 mcg by mouth daily.   fluticasone (FLONASE) 50 MCG/ACT nasal spray Place 1 spray into both nostrils daily as needed for allergies.   gabapentin (NEURONTIN) 300 MG capsule Take 300 mg by mouth 3 (three) times daily.   Melatonin 3 MG TABS Take 1 tablet (3 mg total) by mouth at bedtime.   methocarbamol (ROBAXIN) 750 MG tablet TAKE 1 TABLET BY MOUTH TWICE DAILY AS NEEDED FOR MUSCLE SPASMS   nitroGLYCERIN (NITROSTAT) 0.4 MG SL tablet Place 1 tablet (0.4 mg total) under the tongue every 5 (five) minutes as needed for chest pain. call 911 if chest pain not better (Patient taking differently: Place 0.4 mg under the tongue every 5 (five) minutes as needed for chest pain (and CALL 9-1-1, IF CHEST PAIN DOES NOT IMPROVE).)   nystatin cream (MYCOSTATIN) APPLY TOPICALLY TO RASH FOUR TIMES DAILYFOR TWO WEEKS   omeprazole (PRILOSEC) 20 MG capsule Take 1 capsule (20 mg  total) by mouth daily.   traZODone (DESYREL) 100 MG tablet TAKE 1 TABLET BY MOUTH EACH NIGHT AT BEDTIME    Allergies  Allergen Reactions   Lisinopril Cough    SOCIAL HISTORY/FAMILY HISTORY   Reviewed in Epic:  Pertinent findings:  Social History   Tobacco Use   Smoking status: Former    Packs/day: 3.00    Years: 20.00    Pack years: 60.00    Types: Cigarettes    Quit date: 04/17/1968    Years since quitting: 53.2   Smokeless tobacco: Never  Vaping Use   Vaping Use: Never used  Substance Use Topics   Alcohol use: No   Drug use: No   Social History   Social History Narrative   Patient lives in [place] with his wife and one of his daughters.   Patients other daughter lives in [place].   Patient enjoys playing all sorts of games and spending time with his family.    Left handed     OBJCTIVE -PE, EKG, labs   Wt Readings from Last 3 Encounters:  07/06/21 177 lb 2 oz (80.3 kg)  06/30/21 176 lb (79.8 kg)  06/13/21 180 lb 12.8 oz (82 kg)    Physical Exam: BP 140/61   Pulse 80   Ht 5\' 7"  (1.702 m)   Wt 180 lb 12.8 oz (82 kg)   SpO2 98%   BMI 28.32 kg/m  Physical Exam Vitals reviewed.  Constitutional:      General: He is not in acute distress.    Appearance: He is normal weight. He is not ill-appearing or toxic-appearing.     Comments: Somewhat debilitated gentleman who appears older than stated age.  Mild baseline tremor with somewhat shuffling unsteady gait.  Well-groomed.  Well-nourished.  HENT:     Head: Normocephalic and atraumatic.  Cardiovascular:     Rate and Rhythm: Normal rate and regular rhythm.     Pulses: Normal pulses.     Heart sounds: Normal heart sounds. No murmur heard.  No friction rub. No gallop.  Pulmonary:     Effort: Pulmonary effort is normal. No respiratory distress.     Breath sounds: Normal breath sounds. No wheezing, rhonchi or rales.  Skin:    General: Skin is dry.  Neurological:     Mental Status: He is alert and  oriented to person, place, and time. Mental status is at baseline.     Gait: Gait abnormal (Slow shuffling gait.  Unsteady).     Comments: Mild baseline tremor  Psychiatric:        Mood and Affect: Mood normal.        Behavior: Behavior normal.        Thought Content: Thought content normal.        Judgment: Judgment normal.     Comments: Memory loss    Adult ECG Report  Rate: 80;  Rhythm: normal sinus rhythm and left axis deviation.  IVCD.  Cannot exclude septal MI, age-indeterminate. ;   Narrative Interpretation: Stable  Recent Labs: Reviewed Lab Results  Component Value Date   CHOL 110 06/22/2020   HDL 40 06/22/2020   LDLCALC 53 06/22/2020   LDLDIRECT 84 08/30/2006   TRIG 89 06/22/2020   CHOLHDL 2.8 06/22/2020   Lab Results  Component Value Date   CREATININE 1.11 11/16/2020   BUN 7 (L) 11/16/2020   NA 138 11/16/2020   K 3.8 11/16/2020   CL 104 11/16/2020   CO2 28 11/16/2020   CBC Latest Ref Rng & Units 11/16/2020 11/05/2017 08/29/2017  WBC 4.0 - 10.5 K/uL 5.6 6.1 4.5  Hemoglobin 13.0 - 17.0 g/dL 16.1 11.3(L) 10.9(L)  Hematocrit 39.0 - 52.0 % 39.5 36.1(L) 34.7(L)  Platelets 150 - 400 K/uL 158 160 128(L)    Lab Results  Component Value Date   HGBA1C 6.8 12/28/2020   Lab Results  Component Value Date   TSH 7.022 (H) 05/16/2017    ==================================================  COVID-19 Education: The signs and symptoms of COVID-19 were discussed with the patient and how to seek care for testing (follow up with PCP or arrange E-visit).    I spent a total of 22 minutes with the patient spent in direct patient consultation.  Additional time spent with chart review  / charting (studies, outside notes, etc): 14 min Total Time: 36 min  Current medicines are reviewed at length with the patient today.  (+/- concerns) none  This visit occurred during the SARS-CoV-2 public health emergency.  Safety protocols were in place, including screening questions prior to the  visit, additional usage of staff PPE, and extensive cleaning of exam room while observing appropriate contact time as indicated for disinfecting solutions.  Notice: This dictation was prepared with Dragon dictation along with smart phrase technology. Any transcriptional errors that result from this process are unintentional and may not be corrected upon review.  Studies Ordered:   Orders Placed This Encounter  Procedures   Lipid panel   Comprehensive metabolic panel   EKG 12-Lead    Patient Instructions / Medication Changes & Studies & Tests Ordered   Patient Instructions  Medication Instructions:   Decrease to 1/2 tablet of Atorvastatin 40 mg  ( equal 20  mg )  daily  --- changed to Atorvastatin 20 mg daily    *If you need a refill on your cardiac medications before your next appointment, please call your pharmacy*   Lab Work: CMP LIPID  fasting -  both in 1 to 2 months   If you have labs (blood  work) drawn today and your tests are completely normal, you will receive your results only by: MyChart Message (if you have MyChart) OR A paper copy in the mail If you have any lab test that is abnormal or we need to change your treatment, we will call you to review the results.   Testing/Procedures:  Not needed  Follow-Up: At [facility], you and your health needs are our priority.  As part of our continuing mission to provide you with exceptional heart care, we have created designated Provider Care Teams.  These Care Teams include your primary Cardiologist (physician) and Advanced Practice Providers (APPs -  Physician Assistants and Nurse Practitioners) who all work together to provide you with the care you need, when you need it.     Your next appointment:   12 month(s)  The format for your next appointment:   In Person  Provider:   Bryan Lemma, MD         Bryan Lemma, M.D., M.S. Interventional Cardiologist   Pager # [phone] Phone #  [phone] [address] [place], [place] [address]   Thank you for choosing Heartcare at [facility]!!

## 2021-06-20 ENCOUNTER — Other Ambulatory Visit: Payer: Self-pay | Admitting: Family Medicine

## 2021-06-28 NOTE — Progress Notes (Signed)
? ? ? ?Assessment/Plan:  ? ? ?1.   Essential Tremor ?            Patient is status post bilateral DBS to the bilateral VIM on May 04, 2017.  Patient had postoperative bleed on the left, with resultant hematoma causing paralysis on the right, which has since resolved.  Because of complications, the patient's IPG was not placed until July, 2019.   ? -Some programming changes were made today.  In addition, I gave the patient the ability to make some changes.  Wife shown how to do this. ? -I do not think that the patient likely has Parkinson's disease, but his daughter wondered because of increasing tremor and balance change.  We will go ahead and proceed with DaTscan but my suspicion really is fairly low.  I think his tremor is breakthrough essential tremor (we turned off the device and he had very significant rest and postural).  I think his balance issues are myelopathic.  They do not look parkinsonian.  Even if DaTscan is positive, his balance issues really look myelopathic (even if the results are from presurgery issues). ? ? ?  ?2.  History of myoclonus. ?            -This was related to gabapentin.  Resolved when gabapentin was discontinued.  Neurosurgery has restarted the gabapentin post cervical spine surgery.  Am concerned about that, both because of myoclonus and because of the fact that he has developed dementia.  They have a follow-up appointment with neurosurgery and talk to them about seeing if we could wean that down. ?  ?3.  dementia ? -Wife taking full-time care of the patient now.  There is some concern about her memory as well, both from myself as well as the patient's daughter.  Long discussion with family about living situation as well as power of attorney. ? -Understands that we do not want him driving.   ? -Told patient/wife/daughter that I really need them to make a better plan of care for the home.  Wife is overwhelmed with caregiving, but she really has no plan for respite care.  Had my  LCSW meet with them and am going to have her try to hold them to an accountability plan, even if trying to get home health for respite into the house.  They are willing to pay out-of-pocket. ? ?4.  History of cervical myelopathy ? -Status post cervical decompression with Dr. Venetia MaxonStern August, 2022 ? -looks more myelopathic today and repeat MRI cervical spine.  More bowel incontinence as well ? ? ?5.  blepharospasm ? -Not interested in Botox. ? ? ? ?Subjective:  ? ?[name] W Liberto was seen today, accompanied by his wife and daughter who supplements the history.  Records reviewed from previous.  Tremor is getting worse, left greater than right.  Balance is getting much worse.  Daughter asks if he could have Parkinson's disease.  Patient has been following with  neurosurgery.  Last seen February 15 by Docia BarrierJosh McDaniel.  No changes were made to medication.  He was told his balance changes were due to neuropathy and he was to talk to me about that. ? ?Daughter tells me she has heard he has driven. ? ? ?ALLERGIES:   ?Allergies  ?Allergen Reactions  ? Lisinopril Cough  ? ? ?CURRENT MEDICATIONS:  ?Outpatient Encounter Medications as of 06/30/2021  ?Medication Sig  ? albuterol (VENTOLIN HFA) 108 (90 Base) MCG/ACT inhaler Inhale 2 puffs into the lungs 4 (  four) times daily as needed for shortness of breath.  ? ALLERGY RELIEF 10 MG tablet TAKE 1 TABLET BY MOUTH DAILY  ? aspirin EC 81 MG tablet Take 81 mg by mouth daily.  ? atorvastatin (LIPITOR) 20 MG tablet Take 1 tablet (20 mg total) by mouth daily.  ? Cyanocobalamin (VITAMIN B 12) 500 MCG TABS Take 1,000 mcg by mouth daily.  ? fluticasone (FLONASE) 50 MCG/ACT nasal spray Place 1 spray into both nostrils daily as needed for allergies.  ? Melatonin 3 MG TABS Take 1 tablet (3 mg total) by mouth at bedtime.  ? methocarbamol (ROBAXIN) 750 MG tablet TAKE 1 TABLET BY MOUTH TWICE DAILY AS NEEDED FOR MUSCLE SPASMS  ? nitroGLYCERIN (NITROSTAT) 0.4 MG SL tablet Place 1 tablet (0.4 mg  total) under the tongue every 5 (five) minutes as needed for chest pain. call 911 if chest pain not better (Patient taking differently: Place 0.4 mg under the tongue every 5 (five) minutes as needed for chest pain (and CALL 9-1-1, IF CHEST PAIN DOES NOT IMPROVE).)  ? nystatin cream (MYCOSTATIN) APPLY TOPICALLY TO RASH FOUR TIMES DAILYFOR TWO WEEKS  ? omeprazole (PRILOSEC) 20 MG capsule Take 1 capsule (20 mg total) by mouth daily.  ? traZODone (DESYREL) 100 MG tablet TAKE 1 TABLET BY MOUTH EACH NIGHT AT BEDTIME  ? gabapentin (NEURONTIN) 300 MG capsule Take 300 mg by mouth 3 (three) times daily. (Patient not taking: Reported on 06/30/2021)  ? HYDROcodone-acetaminophen (NORCO/VICODIN) 5-325 MG tablet Take 1-2 tablets by mouth every 4 (four) hours as needed for moderate pain. (Patient not taking: Reported on 06/13/2021)  ? Hyprom-Naphaz-Polysorb-Zn Sulf (CLEAR EYES COMPLETE OP) Place 1 drop into both eyes daily as needed (dry eyes). (Patient not taking: Reported on 06/13/2021)  ? ?Facility-Administered Encounter Medications as of 06/30/2021  ?Medication  ? 0.9 %  sodium chloride infusion  ? sodium chloride 0.9 % injection 3 mL  ? ? ? ?Objective:  ? ? ?PHYSICAL EXAMINATION:   ? ?VITALS:   ?Vitals:  ? 06/30/21 1316  ?BP: 134/72  ?Pulse: (!) 104  ?SpO2: 95%  ?Weight: 176 lb (79.8 kg)  ?Height: 5\' 7"  (1.702 m)  ? ? ? ? ?GEN:  The patient appears stated age and is in NAD. ?HEENT:  Normocephalic, atraumatic.  The mucous membranes are moist.  ?Cardiovascular: Regular rate rhythm ?Lungs: Clear to auscultation bilaterally ?Neck: No bruits ? ?Neurological examination: ? ?Orientation: The patient is alert and oriented to person and place.  He has trouble with details. ?Tone: Normal ?Cranial nerves: There is good facial symmetry. The speech is somewhat dysphasic, but he is clear. Soft palate rises symmetrically and there is no tongue deviation. Hearing is intact to conversational tone.  No significant blepharospasm today. ?Motor:  Strength is at least antigravity x4.  \ ?Coordination: He has no decremation with rapid alternating movements. ?Abnormal movements: there is some degree of rest tremor on both sides that does increase when he ambulates. ?Gait and Station: Patient is very unsteady, both with and without the walker. ? ? ?I have reviewed and interpreted the following labs independently ?  Chemistry   ?   ?Component Value Date/Time  ? NA 138 11/16/2020 1630  ? NA 143 02/04/2019 1534  ? K 3.8 11/16/2020 1630  ? CL 104 11/16/2020 1630  ? CO2 28 11/16/2020 1630  ? BUN 7 (L) 11/16/2020 1630  ? BUN 9 02/04/2019 1534  ? CREATININE 1.11 11/16/2020 1630  ? CREATININE 1.02 05/01/2016 1118  ?    ?  Component Value Date/Time  ? CALCIUM 9.2 11/16/2020 1630  ? ALKPHOS 159 (H) 05/06/2019 1304  ? AST 20 05/06/2019 1304  ? ALT 20 05/06/2019 1304  ? BILITOT 1.1 05/06/2019 1304  ?  ? ?Lab Results  ?Component Value Date  ? [id number] 255 07/26/2020  ? ? ?Total time spent on today's visit was 40 minutes, including both face-to-face time and nonface-to-face time.  Time included that spent on review of records (prior notes available to me/labs/imaging if pertinent), discussing treatment and goals, answering patient's questions and coordinating care.  This was independent of DBS time. ? ?Cc:  Autry-Lott, Simone, DO ? ?

## 2021-06-30 ENCOUNTER — Other Ambulatory Visit: Payer: Self-pay

## 2021-06-30 ENCOUNTER — Ambulatory Visit: Payer: PPO | Admitting: Neurology

## 2021-06-30 ENCOUNTER — Encounter: Payer: Self-pay | Admitting: Neurology

## 2021-06-30 VITALS — BP 134/72 | HR 104 | Ht 67.0 in | Wt 176.0 lb

## 2021-06-30 DIAGNOSIS — F0280 Dementia in other diseases classified elsewhere without behavioral disturbance: Secondary | ICD-10-CM | POA: Diagnosis not present

## 2021-06-30 DIAGNOSIS — G959 Disease of spinal cord, unspecified: Secondary | ICD-10-CM | POA: Diagnosis not present

## 2021-06-30 DIAGNOSIS — G20 Parkinson's disease: Secondary | ICD-10-CM | POA: Diagnosis not present

## 2021-06-30 DIAGNOSIS — G250 Essential tremor: Secondary | ICD-10-CM | POA: Diagnosis not present

## 2021-06-30 NOTE — Procedures (Signed)
DBS Programming was performed.   ? ?Manufacturer of DBS device: [employer] ? ?Total time spent programming was 15 minutes.  Device was confirmed to be on.  Soft start was confirmed to be on.  Impedences were checked and were within normal limits.  Battery was checked and was determined to be functioning normally and not near the end of life.  Final settings were as follows: ? ? Active Contacts Amplitude (V) PW (ms) Frequency (hz) Battery  ?Left Brain       ?08/07/19 1-0+ 3.3 90 160 2.97  ?06/09/20 1-0+ 3.3 90 160 2.93  ?07/26/20 1-0+ 4.0 90 150 2.92  ?01/26/21 1-0+ 4.1 90 160 2.89  ?06/30/21 1-0+ 4.2 90 160 2.83  ?       ?       ?Right Brain       ?08/07/19 1-0+ 2.7 90 150 2.98  ?06/09/20 1-0+ 2.7 90 150 2.95  ?07/26/20 1-0+ 2.8 90 145 2.94  ?01/26/21 1-0+ 3.4 90 145 2.90  ?06/30/21 1-0+ 3.7 (3.3-4.3) 90 145 2.87  ?       ?       ? ? ?

## 2021-07-01 LAB — LIPID PANEL
Chol/HDL Ratio: 3.2 ratio (ref 0.0–5.0)
Cholesterol, Total: 120 mg/dL (ref 100–199)
HDL: 38 mg/dL — ABNORMAL LOW (ref >39)
LDL Chol Calc (NIH): 66 mg/dL (ref 0–99)
Triglycerides: 77 mg/dL (ref 0–149)
VLDL Cholesterol Cal: 16 mg/dL (ref 5–40)

## 2021-07-01 LAB — COMPREHENSIVE METABOLIC PANEL
ALT: 12 IU/L (ref 0–44)
AST: 20 IU/L (ref 0–40)
Albumin/Globulin Ratio: 1.8 (ref 1.2–2.2)
Albumin: 4.6 g/dL (ref 3.7–4.7)
Alkaline Phosphatase: 122 IU/L — ABNORMAL HIGH (ref 44–121)
BUN/Creatinine Ratio: 6 — ABNORMAL LOW (ref 10–24)
BUN: 7 mg/dL — ABNORMAL LOW (ref 8–27)
Bilirubin Total: 1.4 mg/dL — ABNORMAL HIGH (ref 0.0–1.2)
CO2: 25 mmol/L (ref 20–29)
Calcium: 9.7 mg/dL (ref 8.6–10.2)
Chloride: 101 mmol/L (ref 96–106)
Creatinine, Ser: 1.23 mg/dL (ref 0.76–1.27)
Globulin, Total: 2.5 g/dL (ref 1.5–4.5)
Glucose: 159 mg/dL — ABNORMAL HIGH (ref 70–99)
Potassium: 5 mmol/L (ref 3.5–5.2)
Sodium: 140 mmol/L (ref 134–144)
Total Protein: 7.1 g/dL (ref 6.0–8.5)
eGFR: 60 mL/min/{1.73_m2} (ref >59)

## 2021-07-01 NOTE — Addendum Note (Signed)
Addended by: Karl Luke A on: 07/01/2021 10:58 AM ? ? Modules accepted: Orders ? ?

## 2021-07-04 ENCOUNTER — Telehealth: Payer: Self-pay

## 2021-07-04 NOTE — Telephone Encounter (Signed)
Patient prior auth approved called Brendan [name] with [employer] and patient they will all be there on March 31st Friday at 2:00 PM ?

## 2021-07-05 ENCOUNTER — Telehealth: Payer: Self-pay | Admitting: Licensed Clinical Social Worker

## 2021-07-05 NOTE — Telephone Encounter (Signed)
Student Social Worker, Loletha Grayer, called Pt's daughter, [name], to follow up on how care options were going. [name] indicated that she had made calls to an attorney but was waiting to hear back, and had called a private in-home aide, but the aide was unable to come to the home due to personal health reasons. [name] expressed that she was still working on [place], but was worried that because her parents live in [place] instead of [place] the recommended resources might not work out. Student Social Worker sent a follow up email with a comprehensive list of respite resources, contact information for the [employer], and a link to [email or web address] for [facility] to use should more of her options prove unviable. [name] seemed on top of things and eager to find her parents help, and expressed that she was on her way to talk with her mother about their income in order to finish finding services. ?

## 2021-07-06 ENCOUNTER — Ambulatory Visit (INDEPENDENT_AMBULATORY_CARE_PROVIDER_SITE_OTHER): Payer: PPO | Admitting: Family Medicine

## 2021-07-06 ENCOUNTER — Other Ambulatory Visit: Payer: Self-pay

## 2021-07-06 VITALS — BP 144/60 | HR 73 | Wt 177.1 lb

## 2021-07-06 DIAGNOSIS — R1031 Right lower quadrant pain: Secondary | ICD-10-CM

## 2021-07-06 DIAGNOSIS — E785 Hyperlipidemia, unspecified: Secondary | ICD-10-CM | POA: Diagnosis not present

## 2021-07-06 NOTE — Progress Notes (Signed)
? ? ?  SUBJECTIVE:  ? ?CHIEF COMPLAINT / HPI:  ?Patient presents with both wife and daughter. He has a knot along the right groin area, wife has noticed swelling last night when she cathed him last night. The swelling has been ongoing for about a week but finally noticed a knot yesterday. It seems to be tender to touch but otherwise not painful. This has never occurred before. Denies dysuria or rash. Uses the restroom throughout the day on his own but uses a cath at night. Denies fever, chills, redness, changes in physical activity, recent trauma or injury or other symptoms.  ? ?Daughter also requests to have recent blood work reviewed, specifically inquiring about cholesterol as patient has recently seen cardiologist, Dr. Herbie Baltimore. Most recent lipid panel on 06/30/2021 notable for cholesterol 120, triglycerides 77, HDL 38 and LDL 66. Compliant on atorvastatin.  ? ?OBJECTIVE:  ? ?BP (!) 144/60   Pulse 73   Wt 177 lb 2 oz (80.3 kg)   SpO2 98%   BMI 27.74 kg/m?   ?General: Patient well-appearing, in no acute distress. ?CV: RRR, no murmurs or gallops auscultated ?Resp: CTAB, no wheezing, rales or rhonchi noted ?GU: normal penis and scrotum, no erythema or edema noted, no nodules palpated along groin, no penile discharge noted, no tenderness noted along palpation, presence of lymph nodes mildly palpable along inguinal region bilaterally  ?Neuro: ambulates with assistance of walker ?Psych: mood appropriate, pleasant  ? ?ASSESSMENT/PLAN:  ? ?Right groin pain ?-reassuringly improving pain only, likely secondary presence of lymph nodes ?-no signs of systemic infection or epididymis, also inguinal hernia of consideration but no new physical changes ?-reassurance provided ?-strict return precautions provided, follow up as appropriate  ? ?Hyperlipidemia LDL goal <70 ?-recent lipid panel and blood work discussed, LDL at goal of <70 ?-atorvastatin recently decreased to 20 mg by Dr. Herbie Baltimore, instructed to continue this dosage as  recommended by cardiologist  ?-maintain routine follow up with cardiology outpatient  ?-diet and exercise modifications  ?  ?-PHQ-9 score of 9 with negative question 9 reviewed.  ?-Instructed to follow up for physical with PCP  ? ?Reece Leader, DO ?[facility]  ?

## 2021-07-06 NOTE — Assessment & Plan Note (Signed)
-  reassuringly improving pain only, likely secondary presence of lymph nodes ?-no signs of systemic infection or epididymis, also inguinal hernia of consideration but no new physical changes ?-reassurance provided ?-strict return precautions provided, follow up as appropriate  ?

## 2021-07-06 NOTE — Patient Instructions (Signed)
It was great seeing you today! ? ?Today we discussed your knot that you noticed in your groin, both sides look the same and I did not note any abnormalities. I think these are your lymph nodes which are draining which is normal. If you develop a fever, chills, rash, worsening swelling, redness or any growth then please return.  ? ?Please follow up at your next scheduled appointment, if anything arises between now and then, please don't hesitate to contact our office. ? ? ?Thank you for allowing [place] to be a part of your medical care! ? ?Thank you, ?Dr. Robyne Peers  ?

## 2021-07-06 NOTE — Assessment & Plan Note (Addendum)
-  recent lipid panel and blood work discussed, LDL at goal of <70 ?-atorvastatin recently decreased to 20 mg by Dr. Herbie Baltimore, instructed to continue this dosage as recommended by cardiologist  ?-maintain routine follow up with cardiology outpatient  ?-diet and exercise modifications  ?

## 2021-07-10 ENCOUNTER — Encounter: Payer: Self-pay | Admitting: Cardiology

## 2021-07-10 NOTE — Assessment & Plan Note (Signed)
Borderline blood pressure today, but we are allowing for mild permissive hypertension to avoid dizziness.  He has had issues with orthostatic hypotension especially with his Parkinson's. ? ?He was having issues with low BP levels while on ACE inhibitor.  We have now stopped that and allowing for permissive hypertension. ? ?We will continue with no medications. ?

## 2021-07-10 NOTE — Assessment & Plan Note (Signed)
No longer taking Lasix.  Simply recommend foot elevation and graduated compression stockings if able to wear them. ? ?Swelling is more consistent with venous stasis than CHF. ?

## 2021-07-10 NOTE — Assessment & Plan Note (Signed)
2 of 3 grafts patent with occluded SVG having diagonal branch.  No active anginal symptoms. ? ?Plan: Continue with aspirin, reduced dose of atorvastatin.  ? Not on beta-blocker or ACE inhibitor/ARB for low blood pressure issues and fatigue ? ?PRN simply nitroglycerin.  Not requiring.[name] ?

## 2021-07-10 NOTE — Assessment & Plan Note (Signed)
Last lipids were from March 2022.  We need to recheck labs, but I think were probably okay taking 20 mg of atorvastatin.  There was a plan last visit. ? ?Plan: Reduce atorvastatin to 20 mg daily.  Would tolerate higher levels of lipids if they do go up as this may help his fatigue and memory issues.[name] ?

## 2021-07-10 NOTE — Assessment & Plan Note (Signed)
He had a LAD bifurcation lesion noted back in 2004, referred for three-vessel CABG.  Graft to the diagonal is occluded.  LIMA to LAD and SVG-OM still patent. ? ?No active angina symptoms.  In light of him not having ongoing symptoms, we probably not going to continue with surveillance stress test.  Very difficult for him to undergo this test.  Difficult because of tremor making images poor quality. ? ?Plan for now to continue with low-dose statin along with aspirin.  Unless he has concerning symptoms, would not consider ischemic evaluation ?

## 2021-07-10 NOTE — Assessment & Plan Note (Addendum)
Did not tolerate being on ARB/ACE I because of hypotension. ? ?Pressures are better off of medications with less dizziness and fatigue. ? ?Plan continue to allow for permissive hypertension.  As long as does not have significant elevated levels, would not treat moderate elevation in blood pressure.  (As long as his blood pressures are nonsustained greater than 160 mmHg, we are okay) ?

## 2021-07-13 ENCOUNTER — Other Ambulatory Visit (HOSPITAL_COMMUNITY): Payer: PPO

## 2021-07-13 ENCOUNTER — Encounter (HOSPITAL_COMMUNITY)
Admission: RE | Admit: 2021-07-13 | Discharge: 2021-07-13 | Disposition: A | Discharge status: Home / Self Care | Payer: PPO | Source: Ambulatory Visit | Attending: Neurology | Admitting: Neurology

## 2021-07-13 ENCOUNTER — Other Ambulatory Visit: Payer: Self-pay

## 2021-07-13 DIAGNOSIS — G250 Essential tremor: Secondary | ICD-10-CM | POA: Diagnosis present

## 2021-07-13 MED ORDER — POTASSIUM IODIDE (ANTIDOTE) 130 MG PO TABS: 130.0000 mg | ORAL_TABLET | Freq: Once | ORAL | Status: DC

## 2021-07-13 MED ORDER — IOFLUPANE I 123 185 MBQ/2.5ML IV SOLN
4.4000 | Freq: Once | INTRAVENOUS | Status: AC | PRN
Start: 2021-07-13 — End: 2021-07-13
  Administered 2021-07-13: 4.4 via INTRAVENOUS
  Filled 2021-07-13: qty 5

## 2021-07-13 MED ORDER — POTASSIUM IODIDE (ANTIDOTE) 130 MG PO TABS
ORAL_TABLET | ORAL | Status: AC
  Administered 2021-07-13: 130 mg via ORAL
  Filled 2021-07-13: qty 1

## 2021-07-14 ENCOUNTER — Telehealth: Payer: Self-pay | Admitting: Neurology

## 2021-07-14 NOTE — Telephone Encounter (Signed)
Called patients daughter and let her know that patient does need to attend appointment for MRI tomorrow  ?

## 2021-07-14 NOTE — Telephone Encounter (Signed)
Patients daughter wanted to make sure Brendan [name] still needed to get his MRI tomorrow, March 31. ?

## 2021-07-15 ENCOUNTER — Ambulatory Visit (HOSPITAL_COMMUNITY)
Admission: RE | Admit: 2021-07-15 | Discharge: 2021-07-15 | Disposition: A | Discharge status: Home / Self Care | Payer: PPO | Source: Ambulatory Visit | Attending: Neurology | Admitting: Neurology

## 2021-07-15 DIAGNOSIS — G959 Disease of spinal cord, unspecified: Secondary | ICD-10-CM | POA: Insufficient documentation

## 2021-07-15 MED ORDER — GADOBUTROL 1 MMOL/ML IV SOLN
8.0000 mL | Freq: Once | INTRAVENOUS | Status: AC | PRN
  Administered 2021-07-15: 8 mL via INTRAVENOUS

## 2021-07-18 ENCOUNTER — Telehealth: Payer: Self-pay | Admitting: Neurology

## 2021-07-18 NOTE — Telephone Encounter (Signed)
Spoke to Dr. Arbutus Leas she has received the MRI and reached out to Dr. Jake Samples regarding this  ?

## 2021-07-18 NOTE — Telephone Encounter (Signed)
Received Access Nurse message on 07/16/21 from [facility]. They have critical findings for an imaging study done for this patient. Full access nurse message in Dr. Don Perking box. ?

## 2021-07-19 ENCOUNTER — Telehealth: Payer: Self-pay | Admitting: Licensed Clinical Social Worker

## 2021-07-19 ENCOUNTER — Encounter: Payer: PPO | Admitting: Neurology

## 2021-07-19 NOTE — Telephone Encounter (Signed)
Student social worker, Loletha Grayer, called Pt's daughter, Brendan [name], to check on how things were going after our last phone call on 3/21. Brendan [name] indicated that neither of her parents are doing well, and that her mother had to go to the hospital last week for problems with hypertension. Because the health of her parents are declining, Brendan [name] indicated that her first priority was getting in touch with [employer], but said she's been struggling to find the time with everything going on. As of now, she is still gathering information from her mother to send over to the law firm, and hopes to finish up soon. Brendan [name] is staying optimistic, but did say that there are some really hard days watching her parents' health decline. Brendan [name] finally got her father to agree to allow her uncle to help out with his care, and seemed to get some relief from that. Brendan [name] indicated that she is staying hopeful, and that we could touch base again some time next week to see how things are going. ?

## 2021-07-19 NOTE — Progress Notes (Signed)
? ? ? ?Assessment/Plan:  ? ? ?1.   Essential Tremor/PD ?            Patient is status post bilateral DBS to the bilateral VIM on May 04, 2017.  Patient had postoperative bleed on the left, with resultant hematoma causing paralysis on the right, which has since resolved.  Because of complications, the patient's IPG was not placed until July, 2019.   ? -DaTscan was just slightly positive.  I still think balance changes are likely from the myelopathy, but I am going to go ahead and try levodopa.  It is certainly possible he has ET/PD but decreased radiotracer activity was only seen in the most posterior aspect of the left putamen, and his symptoms are really on both sides.  We discussed risk, benefits, side effects.  They will let me know if he has any increasing confusion or develops hallucinations.  They asked if it would cause more behavioral issues, but I told him it would not.  They are noticing some anger/aggressiveness. ? -handicap placard filled out ? ? ? ?  ?2.  History of myoclonus. ?            -This was related to gabapentin.  Resolved when gabapentin was discontinued.  Neurosurgery has restarted the gabapentin post cervical spine surgery.  Am concerned about that, both because of myoclonus and because of the fact that he has developed dementia.  They have a follow-up appointment with neurosurgery and talk to them about seeing if we could wean that down. ?  ?3.  dementia ? -Wife taking full-time care of the patient now.  There is some concern about her memory as well, both from myself as well as the patient's daughter.  Long discussion with family about living situation as well as power of attorney. ? -I have discussed with them that they need a long-term plan.  His daughter lives in [place] and neither the patient or wife is driving.  They need assistance in the home and just need to pay out-of-pocket for that (they are willing). ? ?4.  History of cervical myelopathy ? -Status post cervical  decompression with Dr. Vertell Limber August, 2022 ? -Repeat MRI cervical spine with evidence of severe spinal stenosis at C3-C4.  Discussed with Dr. Reatha Armour.  He is going to follow-up with the patient.  I called their office today to try and get an appointment.  I was not able to achieve that, but the office stated that they left an message with the person who does appointments for Dr. Reatha Armour.  Admittedly, I worry about more surgery with this patient, especially given memory change.  However, I do think that he is looking more myelopathic. ? ? ?5.  blepharospasm ? -Not interested in Botox. ? ? ? ?Subjective:  ? ?Brendan [name] was seen today, accompanied by his wife and daughter who supplements the history.  Records reviewed from previous.  I also spoke to Dr. Reatha Armour about the patient's case.  Patient had repeat neuroimaging of the cervical spine on March 31.  There was severe spinal stenosis at C3-C4.  There is focal spinal cord signal change at C3-C4.  Patient with a DaT scan completed on March 29.  I personally reviewed that.  There is decreased radiotracer activity in the most posterior aspect of the left putamen. ? ?Daughter tells me she has heard he has driven. ? ? ?ALLERGIES:   ?Allergies  ?Allergen Reactions  ? Lisinopril Cough  ? ? ?CURRENT MEDICATIONS:  ?  Outpatient Encounter Medications as of 07/20/2021  ?Medication Sig  ? albuterol (VENTOLIN HFA) 108 (90 Base) MCG/ACT inhaler Inhale 2 puffs into the lungs 4 (four) times daily as needed for shortness of breath.  ? ALLERGY RELIEF 10 MG tablet TAKE 1 TABLET BY MOUTH DAILY  ? aspirin EC 81 MG tablet Take 81 mg by mouth daily.  ? atorvastatin (LIPITOR) 20 MG tablet Take 1 tablet (20 mg total) by mouth daily.  ? Cyanocobalamin (VITAMIN B 12) 500 MCG TABS Take 1,000 mcg by mouth daily.  ? fluticasone (FLONASE) 50 MCG/ACT nasal spray Place 1 spray into both nostrils daily as needed for allergies.  ? gabapentin (NEURONTIN) 300 MG capsule Take 300 mg by mouth 3 (three) times  daily.  ? HYDROcodone-acetaminophen (NORCO/VICODIN) 5-325 MG tablet Take 1-2 tablets by mouth every 4 (four) hours as needed for moderate pain.  ? Hyprom-Naphaz-Polysorb-Zn Sulf (CLEAR EYES COMPLETE OP) Place 1 drop into both eyes daily as needed (dry eyes).  ? Melatonin 3 MG TABS Take 1 tablet (3 mg total) by mouth at bedtime.  ? methocarbamol (ROBAXIN) 750 MG tablet TAKE 1 TABLET BY MOUTH TWICE DAILY AS NEEDED FOR MUSCLE SPASMS  ? nitroGLYCERIN (NITROSTAT) 0.4 MG SL tablet Place 1 tablet (0.4 mg total) under the tongue every 5 (five) minutes as needed for chest pain. call 911 if chest pain not better (Patient taking differently: Place 0.4 mg under the tongue every 5 (five) minutes as needed for chest pain (and CALL 9-1-1, IF CHEST PAIN DOES NOT IMPROVE).)  ? nystatin cream (MYCOSTATIN) APPLY TOPICALLY TO RASH FOUR TIMES DAILYFOR TWO WEEKS  ? omeprazole (PRILOSEC) 20 MG capsule Take 1 capsule (20 mg total) by mouth daily.  ? traZODone (DESYREL) 100 MG tablet TAKE 1 TABLET BY MOUTH EACH NIGHT AT BEDTIME  ? ?Facility-Administered Encounter Medications as of 07/20/2021  ?Medication  ? 0.9 %  sodium chloride infusion  ? sodium chloride 0.9 % injection 3 mL  ? ? ? ?Objective:  ? ? ?PHYSICAL EXAMINATION:   ? ?VITALS:   ?Vitals:  ? 07/20/21 1011  ?BP: (!) 145/61  ?Pulse: 91  ?SpO2: 92%  ?Weight: 176 lb 6.4 oz (80 kg)  ?Height: 5\' 6"  (1.676 m)  ? ? ? ? ? ?GEN:  The patient appears stated age and is in NAD. ?HEENT:  Normocephalic, atraumatic.  The mucous membranes are moist.  ?Cardiovascular: Regular rate rhythm ?Lungs: Clear to auscultation bilaterally ?Neck: No bruits ? ?Neurological examination: ? ?Orientation: The patient is alert and oriented to person and place.  He has trouble with details. ?Tone: Normal ?Cranial nerves: There is good facial symmetry. The speech is somewhat dysphasic, but he is clear. Soft palate rises symmetrically and there is no tongue deviation. Hearing is intact to conversational tone.  No  significant blepharospasm today. ?Motor: Strength is at least antigravity x4.  \ ?Coordination: He has no decremation with rapid alternating movements.  He is somewhat apraxic. ?Abnormal movements: there is some degree of rest tremor on both sides that does increase when he ambulates. ?Gait and Station: Patient is very unsteady, both with and without the walker.  He walks in a spastic fashion. ? ? ?I have reviewed and interpreted the following labs independently ?  Chemistry   ?   ?Component Value Date/Time  ? NA 140 06/30/2021 1143  ? K 5.0 06/30/2021 1143  ? CL 101 06/30/2021 1143  ? CO2 25 06/30/2021 1143  ? BUN 7 (L) 06/30/2021 1143  ? CREATININE 1.23  06/30/2021 1143  ? CREATININE 1.02 05/01/2016 1118  ?    ?Component Value Date/Time  ? CALCIUM 9.7 06/30/2021 1143  ? ALKPHOS 122 (H) 06/30/2021 1143  ? AST 20 06/30/2021 1143  ? ALT 12 06/30/2021 1143  ? BILITOT 1.4 (H) 06/30/2021 1143  ?  ? ?Lab Results  ?Component Value Date  ? [id number] 255 07/26/2020  ? ? ?Total time spent on today's visit was 31 minutes, including both face-to-face time and nonface-to-face time.  Time included that spent on review of records (prior notes available to me/labs/imaging if pertinent), discussing treatment and goals, answering patient's questions and coordinating care.  This was independent of DBS time. ? ?Cc:  Autry-Lott, Simone, DO ? ?

## 2021-07-20 ENCOUNTER — Ambulatory Visit: Payer: PPO | Admitting: Neurology

## 2021-07-20 ENCOUNTER — Encounter: Payer: Self-pay | Admitting: Neurology

## 2021-07-20 VITALS — BP 145/61 | HR 91 | Ht 66.0 in | Wt 176.4 lb

## 2021-07-20 DIAGNOSIS — G959 Disease of spinal cord, unspecified: Secondary | ICD-10-CM

## 2021-07-20 DIAGNOSIS — G20 Parkinson's disease: Secondary | ICD-10-CM | POA: Diagnosis not present

## 2021-07-20 DIAGNOSIS — F0280 Dementia in other diseases classified elsewhere without behavioral disturbance: Secondary | ICD-10-CM

## 2021-07-20 MED ORDER — CARBIDOPA-LEVODOPA 25-100 MG PO TABS: 1.0000 | ORAL_TABLET | Freq: Three times a day (TID) | ORAL | 1 refills | Status: DC

## 2021-07-20 NOTE — Patient Instructions (Signed)
Start Carbidopa Levodopa as follows: ?Take 1/2 tablet three times daily, at least 30 minutes before meals (approximately 9am/1pm/5pm), for one week ?Then take 1/2 tablet in the morning, 1/2 tablet in the afternoon, 1 tablet in the evening, at least 30 minutes before meals, for one week ?Then take 1/2 tablet in the morning, 1 tablet in the afternoon, 1 tablet in the evening, at least 30 minutes before meals, for one week ?Then take 1 tablet three times daily at 9am/1pm/5pm at least 30 minutes before meals ?  As a reminder, carbidopa/levodopa can be taken at the same time as a carbohydrate, but we like to have you take your pill either 30 minutes before a protein source or 1 hour after as protein can interfere with carbidopa/levodopa absorption. ? ?

## 2021-07-22 ENCOUNTER — Other Ambulatory Visit: Payer: Self-pay | Admitting: Family Medicine

## 2021-07-27 ENCOUNTER — Other Ambulatory Visit: Payer: Self-pay | Admitting: Family Medicine

## 2021-07-28 ENCOUNTER — Encounter: Payer: PPO | Admitting: Neurology

## 2021-08-15 ENCOUNTER — Other Ambulatory Visit: Payer: Self-pay | Admitting: Family Medicine

## 2021-08-15 ENCOUNTER — Encounter: Payer: PPO | Admitting: Neurology

## 2021-08-15 DIAGNOSIS — G629 Polyneuropathy, unspecified: Secondary | ICD-10-CM

## 2021-08-22 ENCOUNTER — Other Ambulatory Visit: Payer: Self-pay | Admitting: Family Medicine

## 2021-09-11 ENCOUNTER — Other Ambulatory Visit: Payer: Self-pay | Admitting: Family Medicine

## 2021-09-20 ENCOUNTER — Encounter: Payer: Self-pay | Admitting: *Deleted

## 2021-09-24 ENCOUNTER — Other Ambulatory Visit: Payer: Self-pay | Admitting: Family Medicine

## 2021-10-04 ENCOUNTER — Telehealth: Payer: Self-pay | Admitting: Neurology

## 2021-10-04 NOTE — Telephone Encounter (Signed)
Patients daughter would like to speak with someone about a device.

## 2021-10-04 NOTE — Telephone Encounter (Signed)
Called Candice at [employer] to see if she can help patient to adjust the settings or of he will need to come into the office for appointment. He is scheduled to come in next month for a regular scheduled appointment

## 2021-10-04 NOTE — Progress Notes (Unsigned)
Assessment/Plan:    1.   Essential Tremor/PD             Patient is status post bilateral DBS to the bilateral VIM on May 04, 2017.  Patient had postoperative bleed on the left, with resultant hematoma causing paralysis on the right, which has since resolved.  Because of complications, the patient's IPG was not placed until July, 2019.    -DaTscan was just slightly positive.  I still think balance changes are likely from the myelopathy,  -      2.  History of myoclonus.             -This was related to gabapentin.  Resolved when gabapentin was discontinued.  Neurosurgery has restarted the gabapentin post cervical spine surgery.  Am concerned about that, both because of myoclonus and because of the fact that he has developed dementia.  They have a follow-up appointment with neurosurgery and talk to them about seeing if we could wean that down.   3.  dementia  -Wife taking full-time care of the patient now.  There is some concern about her memory as well, both from myself as well as the patient's daughter.  Long discussion with family about living situation as well as power of attorney.  -I have discussed with them that they need a long-term plan.  His daughter lives in [place] and neither the patient or wife is driving.  They need assistance in the home and just need to pay out-of-pocket for that (they are willing).  4.  History of cervical myelopathy  -Status post cervical decompression with Dr. [name] August, 2022  -Repeat MRI cervical spine with continued evidence of severe spinal stenosis at C3-C4.  Patient following with Dr. Jake Samples.  Next appointment in July.   5.  blepharospasm  -Not interested in Botox.    Subjective:   Brendan [name] was seen today, accompanied by his wife and daughter who supplements the history.  Records reviewed from previous.  Patient worked in today as they were concerned about right hand tremor.  He was started on levodopa last visit.  They  report that ***.  They saw Dr. Jake Samples after last visit.  There was evidence of residual cervical canal stenosis, but he thought it was better compared to prior to surgical intervention, but still significant.  They decided to hold off on surgery, but to follow him in 3 months (last seen April).  Carbidopa/levodopa 25/100, 1 tablet 3 times per day.   ALLERGIES:   Allergies  Allergen Reactions   Lisinopril Cough    CURRENT MEDICATIONS:  Outpatient Encounter Medications as of 10/05/2021  Medication Sig   albuterol (VENTOLIN HFA) 108 (90 Base) MCG/ACT inhaler INHALE 2 PUFFS INTO THE LUNGS 4 TIMES DAILY AS NEEDED   ALLERGY RELIEF 10 MG tablet TAKE 1 TABLET BY MOUTH DAILY   aspirin EC 81 MG tablet Take 81 mg by mouth daily.   atorvastatin (LIPITOR) 20 MG tablet Take 1 tablet (20 mg total) by mouth daily.   carbidopa-levodopa (SINEMET IR) 25-100 MG tablet Take 1 tablet by mouth 3 (three) times daily. 9am/1pm/5pm   Cyanocobalamin (VITAMIN B 12) 500 MCG TABS Take 1,000 mcg by mouth daily.   fluticasone (FLONASE) 50 MCG/ACT nasal spray Place 1 spray into both nostrils daily as needed for allergies.   gabapentin (NEURONTIN) 300 MG capsule Take 300 mg by mouth 3 (three) times daily.   HYDROcodone-acetaminophen (NORCO/VICODIN) 5-325 MG tablet Take 1-2 tablets by  mouth every 4 (four) hours as needed for moderate pain.   Hyprom-Naphaz-Polysorb-Zn Sulf (CLEAR EYES COMPLETE OP) Place 1 drop into both eyes daily as needed (dry eyes).   Melatonin 3 MG TABS Take 1 tablet (3 mg total) by mouth at bedtime.   methocarbamol (ROBAXIN) 750 MG tablet TAKE 1 TABLET BY MOUTH TWICE DAILY AS NEEDED FOR MUSCLE SPASMS   nitroGLYCERIN (NITROSTAT) 0.4 MG SL tablet Place 1 tablet (0.4 mg total) under the tongue every 5 (five) minutes as needed for chest pain. call 911 if chest pain not better (Patient taking differently: Place 0.4 mg under the tongue every 5 (five) minutes as needed for chest pain (and CALL 9-1-1, IF CHEST  PAIN DOES NOT IMPROVE).)   nystatin cream (MYCOSTATIN) APPLY TOPICALLY TO RASH FOUR TIMES DAILYFOR TWO WEEKS   omeprazole (PRILOSEC) 20 MG capsule Take 1 capsule (20 mg total) by mouth daily.   traMADol (ULTRAM) 50 MG tablet TAKE 1 TABLET BY MOUTH EVERY 6 HOURS AS NEEDED FOR MODERATE PAIN   traZODone (DESYREL) 100 MG tablet TAKE 1 TABLET BY MOUTH EACH NIGHT AT BEDTIME   Facility-Administered Encounter Medications as of 10/05/2021  Medication   0.9 %  sodium chloride infusion   sodium chloride 0.9 % injection 3 mL     Objective:    PHYSICAL EXAMINATION:    VITALS:   There were no vitals filed for this visit.      GEN:  The patient appears stated age and is in NAD. HEENT:  Normocephalic, atraumatic.  The mucous membranes are moist.  Cardiovascular: Regular rate rhythm Lungs: Clear to auscultation bilaterally Neck: No bruits  Neurological examination:  Orientation: The patient is alert and oriented to person and place.  He has trouble with details. Tone: Normal Cranial nerves: There is good facial symmetry. The speech is somewhat dysphasic, but he is clear. Soft palate rises symmetrically and there is no tongue deviation. Hearing is intact to conversational tone.  No significant blepharospasm today. Motor: Strength is at least antigravity x4.  \ Coordination: He has no decremation with rapid alternating movements.  He is somewhat apraxic. Abnormal movements: there is some degree of rest tremor on both sides that does increase when he ambulates. Gait and Station: Patient is very unsteady, both with and without the walker.  He walks in a spastic fashion.   I have reviewed and interpreted the following labs independently   Chemistry      Component Value Date/Time   NA 140 06/30/2021 1143   K 5.0 06/30/2021 1143   CL 101 06/30/2021 1143   CO2 25 06/30/2021 1143   BUN 7 (L) 06/30/2021 1143   CREATININE 1.23 06/30/2021 1143   CREATININE 1.02 05/01/2016 1118      Component  Value Date/Time   CALCIUM 9.7 06/30/2021 1143   ALKPHOS 122 (H) 06/30/2021 1143   AST 20 06/30/2021 1143   ALT 12 06/30/2021 1143   BILITOT 1.4 (H) 06/30/2021 1143     Lab Results  Component Value Date   VITAMINB12 255 07/26/2020    Total time spent on today's visit was *** minutes, including both face-to-face time and nonface-to-face time.  Time included that spent on review of records (prior notes available to me/labs/imaging if pertinent), discussing treatment and goals, answering patient's questions and coordinating care.  This was independent of DBS time.  Cc:  Lavonda Jumbo, DO

## 2021-10-04 NOTE — Telephone Encounter (Signed)
Tremors In the right hand no issue walking patients daughter said he wanted to try to adjust but didn't know how. Not any other issues but the tremor at this time. Batteries fully charged and machine was on

## 2021-10-04 NOTE — Telephone Encounter (Signed)
Spoke with patient daughter and made the appt for 10-05-21 at 2:00. She will try her best to have patient here at 1:30 but the patient wife has appt earlier in the day so she will try to have him here at 1:30

## 2021-10-05 ENCOUNTER — Ambulatory Visit: Payer: PPO | Admitting: Neurology

## 2021-10-05 ENCOUNTER — Encounter: Payer: Self-pay | Admitting: Neurology

## 2021-10-05 VITALS — BP 142/70 | HR 97 | Ht 67.0 in | Wt 176.0 lb

## 2021-10-05 DIAGNOSIS — G959 Disease of spinal cord, unspecified: Secondary | ICD-10-CM

## 2021-10-05 DIAGNOSIS — G250 Essential tremor: Secondary | ICD-10-CM

## 2021-10-05 NOTE — Procedures (Signed)
DBS Programming was performed.    Manufacturer of DBS device: [employer]  Total time spent programming was 38 minutes.  Device was confirmed to be on.  Soft start was confirmed to be on.  Impedences were checked and were within normal limits.  Battery was checked and was determined to be functioning normally and not near the end of life.  Final settings were as follows:   Active Contacts Amplitude (V) PW (ms) Frequency (hz) Battery  Left Brain       08/07/19 1-0+ 3.3 90 160 2.97  06/09/20 1-0+ 3.3 90 160 2.93  07/26/20 1-0+ 4.0 90 150 2.92  01/26/21 1-0+ 4.1 90 160 2.89  06/30/21 1-0+ 4.2 90 160 2.83  10/05/21 1-0+ 4.4 90 160 2.78                Right Brain       08/07/19 1-0+ 2.7 90 150 2.98  06/09/20 1-0+ 2.7 90 150 2.95  07/26/20 1-0+ 2.8 90 145 2.94  01/26/21 1-0+ 3.4 90 145 2.90  06/30/21 1-0+ 3.7 (3.3-4.3) 90 145 2.87  10/05/21 1-0+ 4.0 90 145 2.83                        several other settings tried today on the L, 10/05/21, including: 1-C+ - lots of tremor; 1-2+ 4.2 90/160 - continued tremor;

## 2021-10-12 ENCOUNTER — Telehealth: Payer: Self-pay | Admitting: Neurology

## 2021-10-12 NOTE — Telephone Encounter (Signed)
Patient's daughter [name] called and said her father does want to proceed with Syn-one test.  She said the contact person will be her for that as far as OOP goes. She wants to be sure to avoid any confusion so please include her contact information with the PA, she said.

## 2021-10-13 NOTE — Telephone Encounter (Signed)
I have spoken with the daughter of this patient and she has let me know they are ready to move forward. I have included her information so that they can contact her if they is any any questions. Paperwork has been faxed waiting on PA

## 2021-10-17 ENCOUNTER — Other Ambulatory Visit: Payer: Self-pay | Admitting: Family Medicine

## 2021-10-17 DIAGNOSIS — G629 Polyneuropathy, unspecified: Secondary | ICD-10-CM

## 2021-11-01 ENCOUNTER — Encounter: Payer: PPO | Admitting: Neurology

## 2021-11-25 ENCOUNTER — Telehealth: Payer: Self-pay

## 2021-11-25 ENCOUNTER — Ambulatory Visit: Payer: PPO | Admitting: Neurology

## 2021-11-25 DIAGNOSIS — R258 Other abnormal involuntary movements: Secondary | ICD-10-CM

## 2021-11-25 NOTE — Procedures (Signed)
Punch Biopsy Procedure Note  Preprocedure Diagnosis: Bradykinesia; Tremor;   Postprocedure Diagnosis: same  Locations: Site 1: left, posterior shoulder;  Site 2: above, left knee;  Site 3: above, left foot  Indications: r/o alpha synucleinopathy  Anesthesia: 3.5 mL Lidocaine 1% with epinephrine without added sodium bicarbonate  Procedure Details Patient informed of the risks (including but not limited to bleeding, pain, infection, scar and infection) and benefits of the procedure.  Informed consent obtained.  The areas which were chosen for biopsy, as above, and surrounding areas were given a sterile prep using betadyne and draped in the usual sterile fashion. The skin was then stretched perpendicular to the skin tension lines and sample removed using the 3 mm punch. Pressure applied, hemostasis achieved.   Dressing applied. The specimen(s) was sent for pathologic examination. The patient tolerated the procedure well.  Estimated Blood Loss: 0 ml  Condition: Stable  Complications: none.  Plan: 1. Instructed to keep the wound dry and covered for 24-48h and clean thereafter. 2. Warning signs of infection were reviewed.   

## 2021-11-25 NOTE — Telephone Encounter (Signed)
Patients wife came to office with patient and stated patients comprehension is doing very bad. She would like to know what to do about his memory.

## 2021-11-28 NOTE — Telephone Encounter (Signed)
Called patients daughter [name] and left message

## 2021-11-29 ENCOUNTER — Other Ambulatory Visit: Payer: Self-pay | Admitting: Family Medicine

## 2021-11-29 ENCOUNTER — Other Ambulatory Visit: Payer: Self-pay | Admitting: Student

## 2021-12-16 ENCOUNTER — Telehealth: Payer: Self-pay | Admitting: Neurology

## 2021-12-16 NOTE — Telephone Encounter (Signed)
Called patient and let his wife know the results. No questions at this time. Patient  did ask for copy of the report to be mailed I will copy aND SEND

## 2021-12-16 NOTE — Telephone Encounter (Signed)
Received results from patient's skin biopsy.  There is no evidence of alpha-synuclein.  There was evidence of small fiber neuropathy.  There was no evidence of amyloid neuropathy.  Let family know that the hallmark pathology seen in Parkinson's disease was not present in the skin biopsy.  However, given clinical syndrome and abnormal DaTscan, I would leave him on levodopa.  Do not change anything for now, and we will see him back at scheduled follow-up.

## 2021-12-23 ENCOUNTER — Other Ambulatory Visit: Payer: Self-pay | Admitting: *Deleted

## 2021-12-23 DIAGNOSIS — G629 Polyneuropathy, unspecified: Secondary | ICD-10-CM

## 2021-12-23 MED ORDER — LORATADINE 10 MG PO TABS: 10.0000 mg | ORAL_TABLET | Freq: Every day | ORAL | 2 refills | Status: DC

## 2021-12-23 MED ORDER — ALBUTEROL SULFATE HFA 108 (90 BASE) MCG/ACT IN AERS: 2.0000 | INHALATION_SPRAY | RESPIRATORY_TRACT | 0 refills | Status: DC | PRN

## 2021-12-23 MED ORDER — METHOCARBAMOL 750 MG PO TABS: 750.0000 mg | ORAL_TABLET | Freq: Two times a day (BID) | ORAL | 0 refills | Status: DC | PRN

## 2022-01-04 ENCOUNTER — Encounter: Payer: Self-pay | Admitting: Family Medicine

## 2022-01-04 ENCOUNTER — Ambulatory Visit (INDEPENDENT_AMBULATORY_CARE_PROVIDER_SITE_OTHER): Payer: PPO | Admitting: Family Medicine

## 2022-01-04 VITALS — BP 159/74 | HR 78 | Wt 173.6 lb

## 2022-01-04 DIAGNOSIS — Z23 Encounter for immunization: Secondary | ICD-10-CM

## 2022-01-04 DIAGNOSIS — E119 Type 2 diabetes mellitus without complications: Secondary | ICD-10-CM | POA: Diagnosis not present

## 2022-01-04 DIAGNOSIS — I10 Essential (primary) hypertension: Secondary | ICD-10-CM | POA: Diagnosis not present

## 2022-01-04 DIAGNOSIS — L304 Erythema intertrigo: Secondary | ICD-10-CM

## 2022-01-04 DIAGNOSIS — R82998 Other abnormal findings in urine: Secondary | ICD-10-CM

## 2022-01-04 LAB — POCT UA - MICROSCOPIC ONLY: Epithelial cells, urine per micros: NONE SEEN

## 2022-01-04 LAB — POCT URINALYSIS DIP (MANUAL ENTRY)
Bilirubin, UA: NEGATIVE
Glucose, UA: NEGATIVE mg/dL
Ketones, POC UA: NEGATIVE mg/dL
Nitrite, UA: POSITIVE — AB
Protein Ur, POC: 30 mg/dL — AB
Spec Grav, UA: 1.01 (ref 1.010–1.025)
Urobilinogen, UA: 0.2 E.U./dL
pH, UA: 5.5 (ref 5.0–8.0)

## 2022-01-04 LAB — POCT GLYCOSYLATED HEMOGLOBIN (HGB A1C): HbA1c, POC (controlled diabetic range): 6.2 % (ref 0.0–7.0)

## 2022-01-04 MED ORDER — ZOSTER VAC RECOMB ADJUVANTED 50 MCG/0.5ML IM SUSR: INTRAMUSCULAR | 1 refills | Status: DC

## 2022-01-04 NOTE — Progress Notes (Unsigned)
    SUBJECTIVE:   CHIEF COMPLAINT / HPI:   Here for a check up accompanied by his wife and daughter  Groin rash - recurrent painful irritated area in crease of scrotum.  Using desitin.  Comes and goes.  No fever or bleeding  Dark Urine - have notice dark urine when his wife caths him.  No pain or frequency or fever   Medications - initially reported taking only two medications but when went through list agreed he was taking more.  Did not bring in any bottles   PERTINENT  PMH / PSH: CAD, COPD Tremor, Brain stimulator, labile BP and Glucose, diabetes    OBJECTIVE:   BP (!) 159/74   Pulse 78   Wt 173 lb 9.6 oz (78.7 kg)   SpO2 98%   BMI 27.19 kg/m  Frail appearing  Mobility:able to get up and down from exam table slowly with mild balance assist Heart - Regular rate and rhythm.  No murmurs, gallops or rubs.    Lungs:  Normal respiratory effort, chest expands symmetrically. Lungs are clear to auscultation, no crackles or wheezes. No CVAT No pedal edema Groin - red irritated intertriginous rash on R with white cream covering partially   ASSESSMENT/PLAN:   Dark urine No symptoms consistent with UTI.   UA consistent with chronic irritation from neurogenic bladder and catheterization.  Will monitor for symptoms   Diabetes mellitus without complication ([place]) Well controlled with diet  Essential hypertension BP Readings from Last 3 Encounters:  01/04/22 (!) 159/74  10/05/21 (!) 142/70  07/20/21 (!) 145/61   Elevated today but given fragility and uncertainty with current medications will ask them to monitor at home.  Check CMET  Intertrigo In groin.  Recommend keeping dry and trying otc lamisil.      Patient Instructions  Good to see you today - Thank you for coming in  Things we discussed today:  Use Vaseline on the biopsy site  Use Terbinafine cream twice a day to the groin area as needed and a hair dryer on no heat setting after baths  I will call you if your  tests are not good.  Otherwise, I will send you a message on MyChart (if it is active) or a letter in the mail..  If you do not hear from me with in 2 weeks please call our office.     You need an diabetes eye exam every year.  Please see your eye doctor.  Ask them to fax [place] a report of your exam   I sent a prescription to your pharmacy for your Zoster vaccines to help prevent Shingles.  The shot may cause a sore arm and mild flu like symptoms for a few days.  You will need a second shot 2 months after your first.    Your goal blood pressure is less than 140/90.  Check your blood pressure several times a week.  If regularly higher than this please let me know - either with MyChart or leaving a phone message. Next visit please bring in your blood pressure cuff.      Please always bring your medication bottles  See Dr Ronnald Ramp in 6 months as needed   Lind Covert, [place]

## 2022-01-04 NOTE — Patient Instructions (Signed)
Good to see you today - Thank you for coming in  Things we discussed today:  Use Vaseline on the biopsy site  Use Terbinafine cream twice a day to the groin area as needed and a hair dryer on no heat setting after baths  I will call you if your tests are not good.  Otherwise, I will send you a message on MyChart (if it is active) or a letter in the mail..  If you do not hear from me with in 2 weeks please call our office.     You need an diabetes eye exam every year.  Please see your eye doctor.  Ask them to fax [place] a report of your exam   I sent a prescription to your pharmacy for your Zoster vaccines to help prevent Shingles.  The shot may cause a sore arm and mild flu like symptoms for a few days.  You will need a second shot 2 months after your first.    Your goal blood pressure is less than 140/90.  Check your blood pressure several times a week.  If regularly higher than this please let me know - either with MyChart or leaving a phone message. Next visit please bring in your blood pressure cuff.      Please always bring your medication bottles  See Dr Ronnald Ramp in 6 months as needed

## 2022-01-05 DIAGNOSIS — L304 Erythema intertrigo: Secondary | ICD-10-CM | POA: Insufficient documentation

## 2022-01-05 LAB — CMP14+EGFR
ALT: 3 IU/L (ref 0–44)
AST: 14 IU/L (ref 0–40)
Albumin/Globulin Ratio: 1.7 (ref 1.2–2.2)
Albumin: 4.2 g/dL (ref 3.8–4.8)
Alkaline Phosphatase: 117 IU/L (ref 44–121)
BUN/Creatinine Ratio: 10 (ref 10–24)
BUN: 11 mg/dL (ref 8–27)
Bilirubin Total: 1.5 mg/dL — ABNORMAL HIGH (ref 0.0–1.2)
CO2: 25 mmol/L (ref 20–29)
Calcium: 9.3 mg/dL (ref 8.6–10.2)
Chloride: 100 mmol/L (ref 96–106)
Creatinine, Ser: 1.15 mg/dL (ref 0.76–1.27)
Globulin, Total: 2.5 g/dL (ref 1.5–4.5)
Glucose: 148 mg/dL — ABNORMAL HIGH (ref 70–99)
Potassium: 4.5 mmol/L (ref 3.5–5.2)
Sodium: 139 mmol/L (ref 134–144)
Total Protein: 6.7 g/dL (ref 6.0–8.5)
eGFR: 65 mL/min/{1.73_m2} (ref >59)

## 2022-01-05 NOTE — Assessment & Plan Note (Signed)
BP Readings from Last 3 Encounters:  01/04/22 (!) 159/74  10/05/21 (!) 142/70  07/20/21 (!) 145/61   Elevated today but given fragility and uncertainty with current medications will ask them to monitor at home.  Check CMET

## 2022-01-05 NOTE — Assessment & Plan Note (Signed)
Well controlled with diet. 

## 2022-01-05 NOTE — Assessment & Plan Note (Signed)
In groin.  Recommend keeping dry and trying otc lamisil.

## 2022-01-05 NOTE — Assessment & Plan Note (Signed)
No symptoms consistent with UTI.   UA consistent with chronic irritation from neurogenic bladder and catheterization.  Will monitor for symptoms

## 2022-01-17 ENCOUNTER — Other Ambulatory Visit: Payer: Self-pay

## 2022-01-17 DIAGNOSIS — G20C Parkinsonism, unspecified: Secondary | ICD-10-CM

## 2022-01-17 DIAGNOSIS — G250 Essential tremor: Secondary | ICD-10-CM

## 2022-01-17 DIAGNOSIS — R258 Other abnormal involuntary movements: Secondary | ICD-10-CM

## 2022-01-17 MED ORDER — CARBIDOPA-LEVODOPA 25-100 MG PO TABS: 1.0000 | ORAL_TABLET | Freq: Three times a day (TID) | ORAL | 0 refills | Status: DC

## 2022-02-01 NOTE — Progress Notes (Signed)
Assessment/Plan:    1.   Essential Tremor             Patient is status post bilateral DBS to the bilateral VIM on May 04, 2017.  Patient had postoperative bleed on the left, with resultant hematoma causing paralysis on the right, which has since resolved.  Because of complications, the patient's IPG was not placed until July, 2019.    -pts IPG on the left is ERI and on the right probably has about 6-8 months left.  However, I would like to get both changed out with bilateral activa [place] devices as pt is at risk for complications with anesthesia with multiple close surgeries, esp with his memory change.  I spoke with Dr. Jake Samples personally and he will try to get them in asap since daughter is having sx soon and she is driver for pt.  Wife doesn't drive.  I also spoke with the medtronic rep to make sure that we could use bilateral activa [place] IPG's and we should have no issue with that.  -DaTscan was just slightly positive.  He subsequently had a negative skin biopsy for alpha-synuclein.  I have always believed that his balance changes are likely from myelopathy.  He is spastic when he ambulates.    -Because of the above, I decided to go ahead and stop his levodopa.  He has gotten more confused with time and although I really do not think that the levodopa is the cause (he has had long-term dementia), I do not necessarily think it has been that beneficial.    2.  History of myoclonus.             -This was related to gabapentin.  Resolved when gabapentin was discontinued.  Neurosurgery has restarted the gabapentin post cervical spine surgery.  Am concerned about that, both because of myoclonus and because of the fact that he has developed dementia.  They have a follow-up appointment with neurosurgery and talk to them about seeing if we could wean that down.   3.  dementia  -Wife taking full-time care of the patient now.  There is some concern about her memory as well, both from myself as well as  the patient's daughter.  She, in fact, states that she is being taken care of by a different neurology group for her memory.  Family has appt with attorney upcoming   4.  History of cervical myelopathy  -Status post cervical decompression with Dr. [name] August, 2022  -Repeat MRI cervical spine with continued evidence of severe spinal stenosis at C3-C4.  Patient following with Dr. Jake Samples.  I agree with Dr. Jake Samples about concerns about putting the patient through another major surgery, but also have concerns about the degree of myelopathy that the patient has, as well as clinical deterioration.   5.  blepharospasm  -Not interested in Botox.    Subjective:   TYELER GOEDKEN was seen today, accompanied by his daughter who supplements the history.  Records reviewed from previous.  The biggest issue really is his walking.  He had a skin biopsy done since last visit.  That was negative for alpha-synuclein.  At our last visit, he was supposed to follow-up with Dr. Jake Samples in July.  I did not get any records from that visit. Daughter states that he hasn't been to Dr. Jake Samples yet and has an appt Nov 22 but she won't be able to go as she is having neck sx in [place].  Pt denies falls.  Wife doing cooking, meds, assist with bathing.  Wife doesn't drive.  Daughter states that wife still has physical and mental limitations.  They are going to an attorney tomorrow.  Carbidopa/levodopa 25/100, 1 tablet 3 times per day.   ALLERGIES:   Allergies  Allergen Reactions   Lisinopril Cough    CURRENT MEDICATIONS:  Outpatient Encounter Medications as of 02/07/2022  Medication Sig   albuterol (VENTOLIN HFA) 108 (90 Base) MCG/ACT inhaler Inhale 2 puffs into the lungs every 4 (four) hours as needed for wheezing or shortness of breath.   aspirin EC 81 MG tablet Take 81 mg by mouth daily.   carbidopa-levodopa (SINEMET IR) 25-100 MG tablet Take 1 tablet by mouth 3 (three) times daily. 9am/1pm/5pm   Cyanocobalamin  (VITAMIN B 12) 500 MCG TABS Take 1,000 mcg by mouth daily.   fluticasone (FLONASE) 50 MCG/ACT nasal spray Place 1 spray into both nostrils daily as needed for allergies.   Hyprom-Naphaz-Polysorb-Zn Sulf (CLEAR EYES COMPLETE OP) Place 1 drop into both eyes daily as needed (dry eyes).   loratadine (ALLERGY RELIEF) 10 MG tablet Take 1 tablet (10 mg total) by mouth daily.   Melatonin 3 MG TABS Take 1 tablet (3 mg total) by mouth at bedtime.   methocarbamol (ROBAXIN) 750 MG tablet Take 1 tablet (750 mg total) by mouth 2 (two) times daily as needed for muscle spasms.   nitroGLYCERIN (NITROSTAT) 0.4 MG SL tablet Place 1 tablet (0.4 mg total) under the tongue every 5 (five) minutes as needed for chest pain. call 911 if chest pain not better   nystatin cream (MYCOSTATIN) APPLY TOPICALLY TO RASH FOUR TIMES DAILYFOR TWO WEEKS   omeprazole (PRILOSEC) 20 MG capsule TAKE 1 CAPSULE BY MOUTH DAILY AS NEEDED FOR ACID REFLUX   traMADol (ULTRAM) 50 MG tablet TAKE 1 TABLET BY MOUTH EVERY 6 HOURS AS NEEDED FOR MODERATE PAIN   traZODone (DESYREL) 100 MG tablet TAKE 1 TABLET BY MOUTH EACH NIGHT AT BEDTIME   Zoster Vaccine Adjuvanted (SHINGRIX) injection Inject 0.5 ml IM and Repeat in 2 months   atorvastatin (LIPITOR) 20 MG tablet Take 1 tablet (20 mg total) by mouth daily.   [DISCONTINUED] omeprazole (PRILOSEC) 20 MG capsule Take 1 capsule (20 mg total) by mouth daily.   [DISCONTINUED] traZODone (DESYREL) 100 MG tablet TAKE 1 TABLET BY MOUTH EACH NIGHT AT BEDTIME   Facility-Administered Encounter Medications as of 02/07/2022  Medication   0.9 %  sodium chloride infusion   sodium chloride 0.9 % injection 3 mL     Objective:    PHYSICAL EXAMINATION:    VITALS:   Vitals:   02/07/22 1342  BP: (!) 141/73  Pulse: 68  SpO2: 99%  Weight: 176 lb (79.8 kg)  Height: 5\' 7"  (1.702 m)    GEN:  The patient appears stated age and is in NAD. HEENT:  Normocephalic, atraumatic.  The mucous membranes are moist.   Cardiovascular: Regular rate rhythm Lungs: Clear to auscultation bilaterally Neck: No bruits Skin:  biopsy sites were clean and well healed  Neurological examination:  Orientation: The patient is alert and oriented to person and place.  He has trouble with details but is able to recall Dr. Reatha Armour and asks me about him. Tone: Normal Cranial nerves: There is good facial symmetry. The speech is somewhat dysphasic, but he is clear. Soft palate rises symmetrically and there is no tongue deviation. Hearing is intact to conversational tone.  Mild blepharospasm today. Motor: Strength is at  least antigravity x4.   Coordination: He has no decremation with rapid alternating movements.  He is somewhat apraxic. Abnormal movements: there is some degree of rest tremor on both sides that is not nearly as significant as the intention tremor.  When his device is turned off, his tremor is much worse, and there is even rest tremor of the R leg    I have reviewed and interpreted the following labs independently   Chemistry      Component Value Date/Time   NA 139 01/04/2022 1237   K 4.5 01/04/2022 1237   CL 100 01/04/2022 1237   CO2 25 01/04/2022 1237   BUN 11 01/04/2022 1237   CREATININE 1.15 01/04/2022 1237   CREATININE 1.02 05/01/2016 1118      Component Value Date/Time   CALCIUM 9.3 01/04/2022 1237   ALKPHOS 117 01/04/2022 1237   AST 14 01/04/2022 1237   ALT 3 01/04/2022 1237   BILITOT 1.5 (H) 01/04/2022 1237     Lab Results  Component Value Date   VITAMINB12 255 07/26/2020    Total time spent on today's visit was 40 minutes, including both face-to-face time and nonface-to-face time.  Time included that spent on review of records (prior notes available to me/labs/imaging if pertinent), discussing treatment and goals, answering patient's questions and coordinating care.  This was independent of DBS time.  Cc:  Erick Alley, DO

## 2022-02-02 ENCOUNTER — Other Ambulatory Visit: Payer: Self-pay | Admitting: Family Medicine

## 2022-02-02 DIAGNOSIS — K219 Gastro-esophageal reflux disease without esophagitis: Secondary | ICD-10-CM

## 2022-02-06 ENCOUNTER — Other Ambulatory Visit: Payer: Self-pay | Admitting: Student

## 2022-02-07 ENCOUNTER — Encounter: Payer: Self-pay | Admitting: Neurology

## 2022-02-07 ENCOUNTER — Ambulatory Visit: Payer: PPO | Admitting: Neurology

## 2022-02-07 VITALS — BP 141/73 | HR 68 | Ht 67.0 in | Wt 176.0 lb

## 2022-02-07 DIAGNOSIS — G959 Disease of spinal cord, unspecified: Secondary | ICD-10-CM

## 2022-02-07 DIAGNOSIS — R2681 Unsteadiness on feet: Secondary | ICD-10-CM

## 2022-02-07 DIAGNOSIS — G250 Essential tremor: Secondary | ICD-10-CM

## 2022-02-07 DIAGNOSIS — R413 Other amnesia: Secondary | ICD-10-CM

## 2022-02-07 DIAGNOSIS — Z9689 Presence of other specified functional implants: Secondary | ICD-10-CM

## 2022-02-07 NOTE — Procedures (Signed)
DBS Programming was performed.    Manufacturer of DBS device: Medtronic  Total time spent programming was 25 minutes.  Device was confirmed to be on.  Soft start was confirmed to be on.  Impedences were checked and were within normal limits.  Battery was checked and was determined to be functioning normally and ERI on the L.  Final settings were as follows:   Active Contacts Amplitude (V) PW (ms) Frequency (hz) Battery  Left Brain       08/07/19 1-0+ 3.3 90 160 2.97  06/09/20 1-0+ 3.3 90 160 2.93  07/26/20 1-0+ 4.0 90 150 2.92  01/26/21 1-0+ 4.1 90 160 2.89  06/30/21 1-0+ 4.2 90 160 2.83  10/05/21 1-0+ 4.4 90 160 2.78  02/07/22 1-0+ 4.1 90 160 2.58         Right Brain       08/07/19 1-0+ 2.7 90 150 2.98  06/09/20 1-0+ 2.7 90 150 2.95  07/26/20 1-0+ 2.8 90 145 2.94  01/26/21 1-0+ 3.4 90 145 2.90  06/30/21 1-0+ 3.7 (3.3-4.3) 90 145 2.87  10/05/21 1-0+ 4.0 90 145 2.83  02/07/22 1-0+ 4.3 90 155 2.71                 several other settings tried previously on the L, 10/05/21, including: 1-C+ - lots of tremor; 1-2+ 4.2 90/160 - continued tremor;

## 2022-02-08 ENCOUNTER — Telehealth: Payer: Self-pay | Admitting: Neurology

## 2022-02-08 NOTE — Telephone Encounter (Signed)
Pt's daughter called in. She wanted to find out what is the difference in the anesthesia to change the battery out for the DBS and when it was placed?

## 2022-02-09 ENCOUNTER — Other Ambulatory Visit: Payer: Self-pay | Admitting: Neurological Surgery

## 2022-02-09 NOTE — Telephone Encounter (Signed)
Called patients daughter and gave information she understood and had no more questions at this time

## 2022-02-15 ENCOUNTER — Encounter (HOSPITAL_COMMUNITY): Payer: Self-pay | Admitting: Neurological Surgery

## 2022-02-15 ENCOUNTER — Other Ambulatory Visit: Payer: Self-pay

## 2022-02-15 NOTE — Progress Notes (Signed)
Interview done with the spouse Brendan [name], due to the pt having memory loss.  PCP - Dr. Larae Grooms  Cardiologist - Dr. Ellyn Hack  EP- Denies  Endocrine- Denies  Pulm- Denies  Chest x-ray - Denies  EKG - 06/13/21 (E)  Stress Test - 09/28/15 (E)  ECHO - 05/16/17 (E)  Cardiac Cath - 10/13/11 (E)  AICD-na PM-na LOOP-na  Nerve Stimulator- Yes- Deep Brain Stimulator  Dialysis- Denies  Sleep Study - Denies CPAP - Denies  LABS- 02/16/22: CBC, BMP  ASA- Denies  ERAS- Yes- clears until [name]- Denies  Anesthesia- No  Brendan [name] denies the pt having chest pain, sob, or fever at this time. All instructions explained to [facility], with a verbal understanding of the material. Brendan [name] also instructed for them to wear a mask and social distance if they go out. The opportunity to ask questions was provided.

## 2022-02-15 NOTE — Progress Notes (Signed)
S.D.W- Instructions   Your procedure is scheduled on Thurs., Nov. 2, 2023 from 2:36PM-4:36PM.  Report to [facility] Main Entrance "A" at 12:00 P.M., then check in with the Admitting office.  Call this number if you have problems the morning of surgery:  [phone]   Remember:  Do not eat after midnight on Nov. 1st  You may drink clear liquids until 3 hours (11:30AM) prior to surgery time the morning of your surgery.   Clear liquids allowed are: Water, Non-Citrus Juices (without pulp), Carbonated Beverages, Clear Tea, Black Coffee ONLY (NO MILK, CREAM OR POWDERED CREAMER of any kind), and Gatorade    Take these medicines the morning of surgery with A SIP OF WATER: Atorvastatin (LIPITOR) Loratadine (ALLERGY RELIEF)   If Needed: Albuterol (VENTOLIN HFA) Fluticasone (FLONASE) Hyprom-Naphaz-Polysorb-Zn Sulf (CLEAR EYES COMPLETE OP) Omeprazole (PRILOSEC) TraMADol (ULTRAM)  As of today, STOP taking any Aspirin (unless otherwise instructed by your surgeon) Aleve, Naproxen, Ibuprofen, Motrin, Advil, Goody's, BC's, all herbal medications, fish oil, and all vitamins.          Do not wear jewelry. Do not wear lotions, powders, cologne or deodorant. Do not shave 48 hours prior to surgery.  Men may shave face and neck. Do not bring valuables to the hospital.  [facility] is not responsible for any belongings or valuables.    Do NOT Smoke (Tobacco/Vaping)  24 hours prior to your procedure  If you use a CPAP at night, you may bring your mask for your overnight stay.   Contacts, glasses, hearing aids, dentures or partials may not be worn into surgery, please bring cases for these belongings   For patients admitted to the hospital, discharge time will be determined by your treatment team.   Patients discharged the day of surgery will not be allowed to drive home, and someone needs to stay with them for 24 hours.  Special instructions:    Oral Hygiene is also important to reduce your risk  of infection.  Remember - BRUSH YOUR TEETH THE MORNING OF SURGERY WITH YOUR REGULAR TOOTHPASTE  [place]- Preparing For Surgery  Before surgery, you can play an important role. Because skin is not sterile, your skin needs to be as free of germs as possible. You can reduce the number of germs on your skin by washing with Antibacterial Soap before surgery.     Please follow these instructions carefully.     Shower the NIGHT BEFORE SURGERY and the MORNING OF SURGERY with Antibacterial Soap.   Pat yourself dry with a CLEAN TOWEL.  Wear CLEAN PAJAMAS to bed the night before surgery  Place CLEAN SHEETS on your bed the night before your surgery  DO NOT SLEEP WITH PETS.  Day of Surgery:  Take a shower with Antibacterial soap. Wear Clean/Comfortable clothing the morning of surgery Do not apply any deodorants/lotions.   Remember to brush your teeth WITH YOUR REGULAR TOOTHPASTE.   If you test positive for Covid, or been in contact with anyone that has tested positive in the last 10 days, please notify your surgeon.  SURGICAL WAITING ROOM VISITATION Patients having surgery or a procedure may have no more than 2 support people in the waiting area - these visitors may rotate.   Children under the age of 36 must have an adult with them who is not the patient. If the patient needs to stay at the hospital during part of their recovery, the visitor guidelines for inpatient rooms apply. Pre-op nurse will coordinate an  appropriate time for 1 support person to accompany patient in pre-op.  This support person may not rotate.   Please refer to the [facility] website for the visitor guidelines for Inpatients (after your surgery is over and you are in a regular room).

## 2022-02-16 ENCOUNTER — Other Ambulatory Visit: Payer: Self-pay

## 2022-02-16 ENCOUNTER — Ambulatory Visit (HOSPITAL_COMMUNITY): Payer: PPO | Admitting: Certified Registered Nurse Anesthetist

## 2022-02-16 ENCOUNTER — Encounter (HOSPITAL_COMMUNITY): Payer: Self-pay | Admitting: Neurological Surgery

## 2022-02-16 ENCOUNTER — Encounter (HOSPITAL_COMMUNITY)
Admission: RE | Disposition: A | Discharge status: Home / Self Care | Payer: Self-pay | Source: Ambulatory Visit | Attending: Neurological Surgery

## 2022-02-16 ENCOUNTER — Ambulatory Visit (HOSPITAL_COMMUNITY)
Admission: RE | Admit: 2022-02-16 | Discharge: 2022-02-16 | Disposition: A | Discharge status: Home / Self Care | Payer: PPO | Source: Ambulatory Visit | Attending: Neurological Surgery | Admitting: Neurological Surgery

## 2022-02-16 ENCOUNTER — Ambulatory Visit (HOSPITAL_BASED_OUTPATIENT_CLINIC_OR_DEPARTMENT_OTHER): Payer: PPO | Admitting: Certified Registered Nurse Anesthetist

## 2022-02-16 DIAGNOSIS — G20A1 Parkinson's disease without dyskinesia, without mention of fluctuations: Secondary | ICD-10-CM | POA: Diagnosis not present

## 2022-02-16 DIAGNOSIS — I10 Essential (primary) hypertension: Secondary | ICD-10-CM

## 2022-02-16 DIAGNOSIS — Z4542 Encounter for adjustment and management of neuropacemaker (brain) (peripheral nerve) (spinal cord): Secondary | ICD-10-CM

## 2022-02-16 DIAGNOSIS — Z87891 Personal history of nicotine dependence: Secondary | ICD-10-CM | POA: Insufficient documentation

## 2022-02-16 DIAGNOSIS — G250 Essential tremor: Secondary | ICD-10-CM | POA: Insufficient documentation

## 2022-02-16 DIAGNOSIS — E119 Type 2 diabetes mellitus without complications: Secondary | ICD-10-CM | POA: Diagnosis not present

## 2022-02-16 DIAGNOSIS — I2510 Atherosclerotic heart disease of native coronary artery without angina pectoris: Secondary | ICD-10-CM | POA: Insufficient documentation

## 2022-02-16 DIAGNOSIS — Z951 Presence of aortocoronary bypass graft: Secondary | ICD-10-CM | POA: Diagnosis not present

## 2022-02-16 DIAGNOSIS — J449 Chronic obstructive pulmonary disease, unspecified: Secondary | ICD-10-CM | POA: Diagnosis not present

## 2022-02-16 DIAGNOSIS — K219 Gastro-esophageal reflux disease without esophagitis: Secondary | ICD-10-CM | POA: Diagnosis not present

## 2022-02-16 HISTORY — PX: SPINAL CORD STIMULATOR BATTERY EXCHANGE: SHX6202

## 2022-02-16 LAB — SURGICAL PCR SCREEN
MRSA, PCR: NEGATIVE
Staphylococcus aureus: POSITIVE — AB

## 2022-02-16 LAB — BASIC METABOLIC PANEL
Anion gap: 12 (ref 5–15)
BUN: 10 mg/dL (ref 8–23)
CO2: 21 mmol/L — ABNORMAL LOW (ref 22–32)
Calcium: 9.2 mg/dL (ref 8.9–10.3)
Chloride: 106 mmol/L (ref 98–111)
Creatinine, Ser: 1.18 mg/dL (ref 0.61–1.24)
GFR, Estimated: >60 mL/min (ref >60)
Glucose, Bld: 176 mg/dL — ABNORMAL HIGH (ref 70–99)
Potassium: 4 mmol/L (ref 3.5–5.1)
Sodium: 139 mmol/L (ref 135–145)

## 2022-02-16 LAB — CBC
HCT: 38.3 % — ABNORMAL LOW (ref 39.0–52.0)
Hemoglobin: 12.9 g/dL — ABNORMAL LOW (ref 13.0–17.0)
MCH: 31.7 pg (ref 26.0–34.0)
MCHC: 33.7 g/dL (ref 30.0–36.0)
MCV: 94.1 fL (ref 80.0–100.0)
Platelets: 157 10*3/uL (ref 150–400)
RBC: 4.07 MIL/uL — ABNORMAL LOW (ref 4.22–5.81)
RDW: 12.8 % (ref 11.5–15.5)
WBC: 5 10*3/uL (ref 4.0–10.5)
nRBC: 0 % (ref 0.0–0.2)

## 2022-02-16 SURGERY — SPINAL CORD STIMULATOR BATTERY EXCHANGE
Anesthesia: General | Laterality: Bilateral

## 2022-02-16 MED ORDER — LIDOCAINE-EPINEPHRINE 1 %-1:100000 IJ SOLN
INTRAMUSCULAR | Status: AC
  Filled 2022-02-16: qty 1

## 2022-02-16 MED ORDER — DEXAMETHASONE SODIUM PHOSPHATE 10 MG/ML IJ SOLN
INTRAMUSCULAR | Status: DC | PRN
  Administered 2022-02-16: 4 mg via INTRAVENOUS

## 2022-02-16 MED ORDER — TRAMADOL HCL 50 MG PO TABS: 50.0000 mg | ORAL_TABLET | Freq: Four times a day (QID) | ORAL | 0 refills | Status: DC | PRN

## 2022-02-16 MED ORDER — PHENYLEPHRINE 80 MCG/ML (10ML) SYRINGE FOR IV PUSH (FOR BLOOD PRESSURE SUPPORT)
PREFILLED_SYRINGE | INTRAVENOUS | Status: DC | PRN
  Administered 2022-02-16 (×3): 80 ug via INTRAVENOUS

## 2022-02-16 MED ORDER — CHLORHEXIDINE GLUCONATE CLOTH 2 % EX PADS: 6.0000 | MEDICATED_PAD | Freq: Once | CUTANEOUS | Status: DC

## 2022-02-16 MED ORDER — PROPOFOL 10 MG/ML IV BOLUS
INTRAVENOUS | Status: DC | PRN
  Administered 2022-02-16: 80 mg via INTRAVENOUS

## 2022-02-16 MED ORDER — SUGAMMADEX SODIUM 200 MG/2ML IV SOLN
INTRAVENOUS | Status: DC | PRN
  Administered 2022-02-16: 200 mg via INTRAVENOUS

## 2022-02-16 MED ORDER — OXYCODONE HCL 5 MG PO TABS: 5.0000 mg | ORAL_TABLET | Freq: Once | ORAL | Status: DC | PRN

## 2022-02-16 MED ORDER — LACTATED RINGERS IV SOLN: INTRAVENOUS | Status: DC

## 2022-02-16 MED ORDER — ACETAMINOPHEN 10 MG/ML IV SOLN: 1000.0000 mg | Freq: Once | INTRAVENOUS | Status: DC | PRN

## 2022-02-16 MED ORDER — FENTANYL CITRATE (PF) 250 MCG/5ML IJ SOLN
INTRAMUSCULAR | Status: AC
  Filled 2022-02-16: qty 5

## 2022-02-16 MED ORDER — ROCURONIUM BROMIDE 10 MG/ML (PF) SYRINGE
PREFILLED_SYRINGE | INTRAVENOUS | Status: DC | PRN
  Administered 2022-02-16: 60 mg via INTRAVENOUS

## 2022-02-16 MED ORDER — OXYCODONE HCL 5 MG/5ML PO SOLN: 5.0000 mg | Freq: Once | ORAL | Status: DC | PRN

## 2022-02-16 MED ORDER — ONDANSETRON HCL 4 MG/2ML IJ SOLN: 4.0000 mg | Freq: Once | INTRAMUSCULAR | Status: DC | PRN

## 2022-02-16 MED ORDER — ORAL CARE MOUTH RINSE: 15.0000 mL | Freq: Once | OROMUCOSAL | Status: AC

## 2022-02-16 MED ORDER — VANCOMYCIN HCL 1000 MG IV SOLR
INTRAVENOUS | Status: DC | PRN
  Administered 2022-02-16: 1000 mg

## 2022-02-16 MED ORDER — LIDOCAINE-EPINEPHRINE 1 %-1:100000 IJ SOLN
INTRAMUSCULAR | Status: DC | PRN
  Administered 2022-02-16: 9 mL

## 2022-02-16 MED ORDER — CHLORHEXIDINE GLUCONATE 0.12 % MT SOLN
15.0000 mL | Freq: Once | OROMUCOSAL | Status: AC
  Administered 2022-02-16: 15 mL via OROMUCOSAL
  Filled 2022-02-16: qty 15

## 2022-02-16 MED ORDER — ASPIRIN EC 81 MG PO TBEC: 81.0000 mg | DELAYED_RELEASE_TABLET | Freq: Every day | ORAL | 11 refills | Status: DC

## 2022-02-16 MED ORDER — PHENYLEPHRINE HCL-NACL 20-0.9 MG/250ML-% IV SOLN
INTRAVENOUS | Status: DC | PRN
  Administered 2022-02-16: 20 ug/min via INTRAVENOUS

## 2022-02-16 MED ORDER — ONDANSETRON HCL 4 MG/2ML IJ SOLN
INTRAMUSCULAR | Status: DC | PRN
  Administered 2022-02-16: 4 mg via INTRAVENOUS

## 2022-02-16 MED ORDER — FENTANYL CITRATE (PF) 250 MCG/5ML IJ SOLN
INTRAMUSCULAR | Status: DC | PRN
  Administered 2022-02-16: 50 ug via INTRAVENOUS

## 2022-02-16 MED ORDER — PHENYLEPHRINE 80 MCG/ML (10ML) SYRINGE FOR IV PUSH (FOR BLOOD PRESSURE SUPPORT)
PREFILLED_SYRINGE | INTRAVENOUS | Status: AC
  Filled 2022-02-16: qty 10

## 2022-02-16 MED ORDER — EPHEDRINE SULFATE-NACL 50-0.9 MG/10ML-% IV SOSY
PREFILLED_SYRINGE | INTRAVENOUS | Status: DC | PRN
  Administered 2022-02-16: 5 mg via INTRAVENOUS

## 2022-02-16 MED ORDER — FENTANYL CITRATE (PF) 100 MCG/2ML IJ SOLN: 25.0000 ug | INTRAMUSCULAR | Status: DC | PRN

## 2022-02-16 MED ORDER — CEFAZOLIN SODIUM-DEXTROSE 2-4 GM/100ML-% IV SOLN
2.0000 g | INTRAVENOUS | Status: AC
  Administered 2022-02-16: 2 g via INTRAVENOUS
  Filled 2022-02-16: qty 100

## 2022-02-16 MED ORDER — VANCOMYCIN HCL 1000 MG IV SOLR
INTRAVENOUS | Status: AC
  Filled 2022-02-16: qty 20

## 2022-02-16 MED ORDER — 0.9 % SODIUM CHLORIDE (POUR BTL) OPTIME
TOPICAL | Status: DC | PRN
  Administered 2022-02-16: 1000 mL

## 2022-02-16 MED ORDER — EPHEDRINE 5 MG/ML INJ
INTRAVENOUS | Status: AC
  Filled 2022-02-16: qty 5

## 2022-02-16 MED ORDER — LIDOCAINE 2% (20 MG/ML) 5 ML SYRINGE
INTRAMUSCULAR | Status: DC | PRN
  Administered 2022-02-16: 100 mg via INTRAVENOUS

## 2022-02-16 SURGICAL SUPPLY — 40 items
ADH SKN CLS APL DERMABOND .7 (GAUZE/BANDAGES/DRESSINGS) ×1
BAG COUNTER SPONGE SURGICOUNT (BAG) ×1 IMPLANT
BAG SPNG CNTER NS LX DISP (BAG) ×1
CANISTER SUCT 3000ML PPV (MISCELLANEOUS) ×1 IMPLANT
DERMABOND ADVANCED .7 DNX12 (GAUZE/BANDAGES/DRESSINGS) ×1 IMPLANT
DRAPE CAMERA VIDEO/LASER (DRAPES) ×1 IMPLANT
DRAPE LAPAROTOMY 100X72 PEDS (DRAPES) ×1 IMPLANT
DRAPE ORTHO SPLIT 77X108 STRL (DRAPES) ×2
DRAPE SURG ORHT 6 SPLT 77X108 (DRAPES) IMPLANT
DRSG OPSITE POSTOP 4X6 (GAUZE/BANDAGES/DRESSINGS) IMPLANT
DURAPREP 26ML APPLICATOR (WOUND CARE) ×1 IMPLANT
ELECT COATED BLADE 2.86 ST (ELECTRODE) ×1 IMPLANT
GAUZE 4X4 16PLY ~~LOC~~+RFID DBL (SPONGE) IMPLANT
GLOVE BIO SURGEON STRL SZ8 (GLOVE) ×1 IMPLANT
GLOVE BIOGEL PI IND STRL 8 (GLOVE) ×1 IMPLANT
GLOVE BIOGEL PI IND STRL 8.5 (GLOVE) ×1 IMPLANT
GLOVE ECLIPSE 8.0 STRL XLNG CF (GLOVE) ×1 IMPLANT
GLOVE EXAM NITRILE XL STR (GLOVE) IMPLANT
GOWN STRL REUS W/ TWL LRG LVL3 (GOWN DISPOSABLE) IMPLANT
GOWN STRL REUS W/ TWL XL LVL3 (GOWN DISPOSABLE) ×2 IMPLANT
GOWN STRL REUS W/TWL 2XL LVL3 (GOWN DISPOSABLE) ×1 IMPLANT
GOWN STRL REUS W/TWL LRG LVL3 (GOWN DISPOSABLE)
GOWN STRL REUS W/TWL XL LVL3 (GOWN DISPOSABLE) ×2
KIT BASIN OR (CUSTOM PROCEDURE TRAY) ×1 IMPLANT
KIT TURNOVER KIT B (KITS) ×1 IMPLANT
MARKER SKIN DUAL TIP RULER LAB (MISCELLANEOUS) ×1 IMPLANT
NDL HYPO 25X1 1.5 SAFETY (NEEDLE) ×1 IMPLANT
NEEDLE HYPO 25X1 1.5 SAFETY (NEEDLE) ×1 IMPLANT
NEUROSTIM OCTOPOLAR ~~LOC~~ 60X55 (Neuro Prosthesis/Implant) IMPLANT
NS IRRIG 1000ML POUR BTL (IV SOLUTION) ×1 IMPLANT
PACK LAMINECTOMY NEURO (CUSTOM PROCEDURE TRAY) ×1 IMPLANT
PAD ARMBOARD 7.5X6 YLW CONV (MISCELLANEOUS) ×3 IMPLANT
SPIKE FLUID TRANSFER (MISCELLANEOUS) ×1 IMPLANT
SUT MNCRL AB 3-0 PS2 18 (SUTURE) IMPLANT
SUT PROLENE 3 0 PS 1 (SUTURE) IMPLANT
SUT VIC AB 2-0 CP2 18 (SUTURE) ×1 IMPLANT
SUT VIC AB 3-0 SH 8-18 (SUTURE) ×1 IMPLANT
TOWEL GREEN STERILE (TOWEL DISPOSABLE) ×1 IMPLANT
TOWEL GREEN STERILE FF (TOWEL DISPOSABLE) ×1 IMPLANT
WATER STERILE IRR 1000ML POUR (IV SOLUTION) ×1 IMPLANT

## 2022-02-16 NOTE — Op Note (Signed)
   Providing Compassionate, Quality Care - Together  Date of service: 02/16/2022  PREOP DIAGNOSIS:  Bilateral IPG end of battery life Essential tremor due to Parkinson's disease  POSTOP DIAGNOSIS: Same  PROCEDURE: Bilateral IPG battery replacement, Medtronic ActivaSC  SURGEON: Dr. Pieter Partridge C. Larance Ratledge, DO  ASSISTANT: None  ANESTHESIA: General Endotracheal  EBL: Less than 10 cc  SPECIMENS: None  DRAINS: None  COMPLICATIONS: None  CONDITION: Hemodynamically stable  HISTORY: Brendan [name] is a 81 y.o. male with a history of bilateral DBS for essential tremor.  He was at his battery life in his bilateral IPG's therefore he presented for surgical intervention for permanent placement bilaterally.  All risks benefits and expected outcomes were discussed and agreed upon, informed consent was obtained.  PROCEDURE IN DETAIL: The patient was brought to the operating room. After induction of general anesthesia, the patient was positioned on the operative table in the supine position. All pressure points were meticulously padded. Skin incision was then marked out and prepped and draped in the usual sterile fashion bilaterally over the chest. Physician driven timeout was performed.  Local anesthetic was injected into the bilateral incisions.  Using 15 blade, the right chest incision was opened sharply.  Using Bovie electrocautery, the prior IPG was cleared from scar tissue and the wire was identified and protected.  This was gently removed from the pocket, new battery was placed.  This was final tightened to the manufacturer's recommendation.  Impedances were checked and noted to be minimal and all contacts were appropriate.  The pocket was noted to be excellently hemostatic.  The battery was placed back in the pocket and it fit appropriately.  Vancomycin powder was placed in the wound.  Pocket layer was closed with 2-0 Vicryl suture.  Dermis was closed with 2-0 and 3-0 Vicryl suture.  Skin was  closed with skin glue.  Using 15 blade, the left chest incision was opened sharply.  Using Bovie electrocautery, the prior IPG was cleared from scar tissue and the wire was identified and protected.  This was gently removed from the pocket, new battery was placed.  This was final tightened to the manufacturer's recommendation.  Impedances were checked and noted to be minimal and all contacts were appropriate.  The pocket was noted to be excellently hemostatic.  The battery was placed back in the pocket and it fit appropriately.  Vancomycin powder was placed in the wound.  Pocket layer was closed with 2-0 Vicryl suture.  Dermis was closed with 2-0 and 3-0 Vicryl suture.  Skin was closed with skin glue.  Sterile dressing applied bilaterally.  At the end of the case all sponge, needle, and instrument counts were correct. The patient was then transferred to the stretcher, extubated, and taken to the post-anesthesia care unit in stable hemodynamic condition.

## 2022-02-16 NOTE — H&P (Signed)
Providing Compassionate, Quality Care - Together  NEUROSURGERY HISTORY & PHYSICAL   [name] is an 81 y.o. male.   Chief Complaint: Bilateral IPG end of battery life HPI: This is a 81 year old male with a history of bilateral DBS placement for tremor.  He has bilateral end of battery life of his IPGs and therefore presents today for bilateral placement.  He has no complaints otherwise at this time.  Past Medical History:  Diagnosis Date   Anxiety disorder    Benign enlargement of prostate    CAD in native artery 1997   Referred for CABG x3 in 2004 for LAD diagonal bifurcation lesion   Cervical spinal stenosis    Complex regional pain syndrome of right upper extremity    Right wrist; L arm   Coronary atherosclerosis of artery bypass graft 09/2011   Occluded SVG-D1; Cardiologist Dr. Ellyn Hack   Gait disorder    Gastroesophageal reflux disease    Glaucoma    Hiatal hernia    GI: Dr Penelope Coop   Hyperlipidemia LDL goal <70    Hypertension    Memory loss    Mild   Obesity    Peripheral neuropathy    possible peripheral neuropathy   Pre-diabetes    S/P CABG x 3 2004    LIMA-LAD, SVG to diagonal, SVG to OM   Tremor, essential    On Primidone   Unstable angina pectoris ([place]) 09/2011   Cardiac cath: occluded SVG-DI. Patent LIMA-LAD and SVG-OM. EF 45% with apical inferior HK.    Past Surgical History:  Procedure Laterality Date   ANTERIOR CERVICAL DECOMP/DISCECTOMY FUSION N/A 11/30/2020   Procedure: Cervical three-four, Cervical four-fve  Anterior cervical decompression/discectomy/fusion;  Surgeon: Erline Levine, MD;  Location: [place];  Service: Neurosurgery;  Laterality: N/A;   CATARACT EXTRACTION, BILATERAL     CORONARY ARTERY BYPASS GRAFT  06/2002   LIMA-LAD, SVG-D1, SVG-OM   EYE SURGERY     KNEE SURGERY Left    LEFT HEART CATH AND CORONARY ANGIOGRAPHY  1997; 2004   In 2004 was referred for CABG due to a LAD-bifurcation lesion   LEFT HEART CATHETERIZATION WITH  CORONARY/GRAFT ANGIOGRAM N/A 10/13/2011   Procedure: LEFT HEART CATHETERIZATION WITH [place] Fetters;  Surgeon: Leonie Man, MD;  Location: [facility] CATH LAB;;  occluded SVG-DI. Patent LIMA-LAD and SVG-OM. EF 45% with apical inferior HK.;   MINOR PLACEMENT OF FIDUCIAL N/A 04/26/2017   Procedure: Fiducial placement;  Surgeon: Erline Levine, MD;  Location: [place];  Service: Neurosurgery;  Laterality: N/A;  Fiducial placement   NM MYOCAR PERF WALL MOTION  03/23/2009   protocol:[place], normal perfusion in all regions, post-stress EF 72%, exercise capacity 7METS, EKG negative for ischemia.   PULSE GENERATOR IMPLANT Bilateral 11/13/2017   Procedure: Bilateral Implantable pulse generator;  Surgeon: Erline Levine, MD;  Location: [place];  Service: Neurosurgery;  Laterality: Bilateral;  Bilateral Implantable pulse generator   Shoulder Orthoscopic Surgery   06/2003   SUBTHALAMIC STIMULATOR INSERTION Bilateral 05/04/2017   Procedure: Bilateral Deep brain stimulator placement;  Surgeon: Erline Levine, MD;  Location: [place];  Service: Neurosurgery;  Laterality: Bilateral;  Bilateral deep brain stimulator placement   TRANSTHORACIC ECHOCARDIOGRAM  04/2017   Jan 2019: Normal LV size and function.  EF 55-60%.  No or W MA.  Mild LA dilation.   wrist ganglion cyst Left     Family History  Problem Relation Age of Onset   Heart disease Mother    Heart disease Brother  Tremor Paternal Uncle    Heart disease Brother    Drug abuse Daughter    Hepatitis C Daughter    Social History:  reports that he quit smoking about 53 years ago. His smoking use included cigarettes. He has a 60.00 pack-year smoking history. He has never used smokeless tobacco. He reports that he does not drink alcohol and does not use drugs.  Allergies:  Allergies  Allergen Reactions   Lisinopril Cough    Medications Prior to Admission  Medication Sig Dispense Refill   albuterol (VENTOLIN HFA) 108 (90 Base) MCG/ACT inhaler Inhale 2 puffs  into the lungs every 4 (four) hours as needed for wheezing or shortness of breath. 8.5 g 0   aspirin EC 81 MG tablet Take 81 mg by mouth daily.     atorvastatin (LIPITOR) 20 MG tablet Take 1 tablet (20 mg total) by mouth daily. 90 tablet 3   Cyanocobalamin (VITAMIN B 12) 500 MCG TABS Take 1,000 mcg by mouth daily. (Patient taking differently: Take 5,000 mcg by mouth daily.) 30 tablet 0   fluticasone (FLONASE) 50 MCG/ACT nasal spray Place 1 spray into both nostrils daily as needed for allergies. 16 g 1   Hyprom-Naphaz-Polysorb-Zn Sulf (CLEAR EYES COMPLETE OP) Place 1 drop into both eyes daily as needed (dry eyes).     loratadine (ALLERGY RELIEF) 10 MG tablet Take 1 tablet (10 mg total) by mouth daily. 30 tablet 2   Melatonin 3 MG TABS Take 1 tablet (3 mg total) by mouth at bedtime. (Patient taking differently: Take 5 mg by mouth at bedtime.) 30 tablet 0   methocarbamol (ROBAXIN) 750 MG tablet Take 1 tablet (750 mg total) by mouth 2 (two) times daily as needed for muscle spasms. 30 tablet 0   nitroGLYCERIN (NITROSTAT) 0.4 MG SL tablet Place 1 tablet (0.4 mg total) under the tongue every 5 (five) minutes as needed for chest pain. call 911 if chest pain not better 30 tablet 3   nystatin cream (MYCOSTATIN) APPLY TOPICALLY TO RASH FOUR TIMES DAILYFOR TWO WEEKS (Patient taking differently: Apply 1 Application topically at bedtime as needed (Rash).) 30 g 0   omeprazole (PRILOSEC) 20 MG capsule TAKE 1 CAPSULE BY MOUTH DAILY AS NEEDED FOR ACID REFLUX 90 capsule 0   traMADol (ULTRAM) 50 MG tablet TAKE 1 TABLET BY MOUTH EVERY 6 HOURS AS NEEDED FOR MODERATE PAIN 30 tablet 0   traZODone (DESYREL) 100 MG tablet TAKE 1 TABLET BY MOUTH EACH NIGHT AT BEDTIME 30 tablet 0   Zoster Vaccine Adjuvanted [facility]) injection Inject 0.5 ml IM and Repeat in 2 months 0.5 mL 1    Results for orders placed or performed during the hospital encounter of 02/16/22 (from the past 48 hour(s))  Basic metabolic panel per protocol      Status: Abnormal   Collection Time: 02/16/22 12:33 PM  Result Value Ref Range   Sodium 139 135 - 145 mmol/L   Potassium 4.0 3.5 - 5.1 mmol/L   Chloride 106 98 - 111 mmol/L   CO2 21 (L) 22 - 32 mmol/L   Glucose, Bld 176 (H) 70 - 99 mg/dL    Comment: Glucose reference range applies only to samples taken after fasting for at least 8 hours.   BUN 10 8 - 23 mg/dL   Creatinine, Ser 8.84 0.61 - 1.24 mg/dL   Calcium 9.2 8.9 - 16.6 mg/dL   GFR, Estimated >06 >30 mL/min    Comment: (NOTE) Calculated using the CKD-EPI Creatinine Equation (2021)  Anion gap 12 5 - 15    Comment: Performed at [facility], [address], [place], [place] [address]  CBC per protocol     Status: Abnormal   Collection Time: 02/16/22 12:33 PM  Result Value Ref Range   WBC 5.0 4.0 - 10.5 K/uL   RBC 4.07 (L) 4.22 - 5.81 MIL/uL   Hemoglobin 12.9 (L) 13.0 - 17.0 g/dL   HCT 73.5 (L) 32.9 - 92.4 %   MCV 94.1 80.0 - 100.0 fL   MCH 31.7 26.0 - 34.0 pg   MCHC 33.7 30.0 - 36.0 g/dL   RDW 26.8 34.1 - 96.2 %   Platelets 157 150 - 400 K/uL   nRBC 0.0 0.0 - 0.2 %    Comment: Performed at [facility], [address] [place], [place] [address]   No results found.  ROS All pertinent positives and negatives are listed in HPI above  Blood pressure (!) 163/76, pulse 85, temperature 97.7 F (36.5 C), temperature source Oral, resp. rate 18, height 5\' 7"  (1.702 m), weight 80.3 kg, SpO2 97 %. Physical Exam  Awake alert oriented x3 PERRLA Face symmetric Speech fluent and slow, at baseline Moves all extremities equally   Assessment/Plan 81 year old male with  End-of-life IPG, bilateral  -OR today for bilateral IPG replacement.  All risks benefits and expected outcomes were discussed and agreed upon with the patient and his family at bedside.  Informed consent was obtained.  Thank you for allowing me to participate in this patient's care.  Please do not hesitate to call with questions or  concerns.   78, DO Neurosurgeon [facility] Cell: [phone]

## 2022-02-16 NOTE — Anesthesia Procedure Notes (Signed)
Procedure Name: Intubation Date/Time: 02/16/2022 3:17 PM  Performed by: Janene Harvey, CRNAPre-anesthesia Checklist: Patient identified, Emergency Drugs available, Suction available and Patient being monitored Patient Re-evaluated:Patient Re-evaluated prior to induction Oxygen Delivery Method: Circle system utilized Preoxygenation: Pre-oxygenation with 100% oxygen Induction Type: IV induction Ventilation: Mask ventilation without difficulty and Oral airway inserted - appropriate to patient size Laryngoscope Size: Glidescope and 3 Grade View: Grade I Tube type: Oral Tube size: 7.5 mm Number of attempts: 1 Airway Equipment and Method: Stylet and Oral airway Placement Confirmation: ETT inserted through vocal cords under direct vision, positive ETCO2 and breath sounds checked- equal and bilateral Secured at: 22 cm Tube secured with: Tape Dental Injury: Teeth and Oropharynx as per pre-operative assessment  Comments: Elective glidescope. History of ACDF, limited ROM

## 2022-02-16 NOTE — Transfer of Care (Signed)
Immediate Anesthesia Transfer of Care Note  Patient: Brendan [name]  Procedure(s) Performed: Bilateral change IPG battery (Bilateral)  Patient Location: PACU  Anesthesia Type:General  Level of Consciousness: drowsy  Airway & Oxygen Therapy: Patient Spontanous Breathing  Post-op Assessment: Report given to RN  Post vital signs: Reviewed and stable  Last Vitals:  Vitals Value Taken Time  BP 181/79 02/16/22 1620  Temp    Pulse 95 02/16/22 1624  Resp 14 02/16/22 1624  SpO2 99 % 02/16/22 1624  Vitals shown include unvalidated device data.  Last Pain:  Vitals:   02/16/22 1312  TempSrc:   PainSc: 0-No pain         Complications: There were no known notable events for this encounter.

## 2022-02-16 NOTE — Anesthesia Postprocedure Evaluation (Signed)
Anesthesia Post Note  Patient: Brendan [name]  Procedure(s) Performed: Bilateral change IPG battery (Bilateral)     Patient location during evaluation: PACU Anesthesia Type: General Level of consciousness: awake and alert Pain management: pain level controlled Vital Signs Assessment: post-procedure vital signs reviewed and stable Respiratory status: spontaneous breathing, nonlabored ventilation, respiratory function stable and patient connected to nasal cannula oxygen Cardiovascular status: blood pressure returned to baseline and stable Postop Assessment: no apparent nausea or vomiting Anesthetic complications: no  There were no known notable events for this encounter.  Last Vitals:  Vitals:   02/16/22 1630 02/16/22 1645  BP: (!) 157/83 (!) 154/74  Pulse: 92 82  Resp: 13 13  Temp:    SpO2: 96% 97%    Last Pain:  Vitals:   02/16/22 1645  TempSrc:   PainSc: 0-No pain                 Lulamae Skorupski S

## 2022-02-16 NOTE — Anesthesia Preprocedure Evaluation (Signed)
Anesthesia Evaluation  Patient identified by MRN, date of birth, ID band Patient awake    Reviewed: Allergy & Precautions, H&P , NPO status , Patient's Chart, lab work & pertinent test results  Airway Mallampati: II  TM Distance: >3 FB Neck ROM: Limited    Dental no notable dental hx.    Pulmonary COPD, former smoker   Pulmonary exam normal breath sounds clear to auscultation       Cardiovascular hypertension, + CAD and + CABG  Normal cardiovascular exam Rhythm:Regular Rate:Normal     Neuro/Psych negative neurological ROS  negative psych ROS   GI/Hepatic Neg liver ROS,GERD  ,,  Endo/Other  diabetes    Renal/GU negative Renal ROS  negative genitourinary   Musculoskeletal negative musculoskeletal ROS (+)    Abdominal   Peds negative pediatric ROS (+)  Hematology negative hematology ROS (+)   Anesthesia Other Findings   Reproductive/Obstetrics negative OB ROS                             Anesthesia Physical Anesthesia Plan  ASA: 3  Anesthesia Plan: General   Post-op Pain Management: Minimal or no pain anticipated   Induction: Intravenous  PONV Risk Score and Plan: 2 and Ondansetron, Dexamethasone and Treatment may vary due to age or medical condition  Airway Management Planned: Oral ETT  Additional Equipment:   Intra-op Plan:   Post-operative Plan: Extubation in OR  Informed Consent: I have reviewed the patients History and Physical, chart, labs and discussed the procedure including the risks, benefits and alternatives for the proposed anesthesia with the patient or authorized representative who has indicated his/her understanding and acceptance.     Dental advisory given  Plan Discussed with: CRNA and Surgeon  Anesthesia Plan Comments:        Anesthesia Quick Evaluation

## 2022-02-20 ENCOUNTER — Encounter (HOSPITAL_COMMUNITY): Payer: Self-pay | Admitting: Neurological Surgery

## 2022-03-10 ENCOUNTER — Other Ambulatory Visit: Payer: Self-pay | Admitting: Student

## 2022-03-10 DIAGNOSIS — G629 Polyneuropathy, unspecified: Secondary | ICD-10-CM

## 2022-03-24 ENCOUNTER — Other Ambulatory Visit: Payer: Self-pay | Admitting: Student

## 2022-04-03 ENCOUNTER — Other Ambulatory Visit: Payer: Self-pay | Admitting: Student

## 2022-04-16 ENCOUNTER — Emergency Department (HOSPITAL_COMMUNITY)
Admission: EM | Admit: 2022-04-16 | Discharge: 2022-04-16 | Disposition: A | Discharge status: Home / Self Care | Payer: PPO | Attending: Emergency Medicine | Admitting: Emergency Medicine

## 2022-04-16 ENCOUNTER — Emergency Department (HOSPITAL_COMMUNITY): Payer: PPO

## 2022-04-16 ENCOUNTER — Other Ambulatory Visit: Payer: Self-pay

## 2022-04-16 DIAGNOSIS — G20A1 Parkinson's disease without dyskinesia, without mention of fluctuations: Secondary | ICD-10-CM | POA: Diagnosis not present

## 2022-04-16 DIAGNOSIS — I2510 Atherosclerotic heart disease of native coronary artery without angina pectoris: Secondary | ICD-10-CM | POA: Diagnosis not present

## 2022-04-16 DIAGNOSIS — K8020 Calculus of gallbladder without cholecystitis without obstruction: Secondary | ICD-10-CM | POA: Insufficient documentation

## 2022-04-16 DIAGNOSIS — Z7982 Long term (current) use of aspirin: Secondary | ICD-10-CM | POA: Insufficient documentation

## 2022-04-16 DIAGNOSIS — R339 Retention of urine, unspecified: Secondary | ICD-10-CM

## 2022-04-16 DIAGNOSIS — R1032 Left lower quadrant pain: Secondary | ICD-10-CM | POA: Diagnosis present

## 2022-04-16 DIAGNOSIS — N390 Urinary tract infection, site not specified: Secondary | ICD-10-CM | POA: Diagnosis not present

## 2022-04-16 DIAGNOSIS — R109 Unspecified abdominal pain: Secondary | ICD-10-CM

## 2022-04-16 LAB — COMPREHENSIVE METABOLIC PANEL
ALT: 12 U/L (ref 0–44)
AST: 18 U/L (ref 15–41)
Albumin: 3.4 g/dL — ABNORMAL LOW (ref 3.5–5.0)
Alkaline Phosphatase: 92 U/L (ref 38–126)
Anion gap: 12 (ref 5–15)
BUN: 8 mg/dL (ref 8–23)
CO2: 27 mmol/L (ref 22–32)
Calcium: 9.2 mg/dL (ref 8.9–10.3)
Chloride: 100 mmol/L (ref 98–111)
Creatinine, Ser: 1.16 mg/dL (ref 0.61–1.24)
GFR, Estimated: >60 mL/min (ref >60)
Glucose, Bld: 165 mg/dL — ABNORMAL HIGH (ref 70–99)
Potassium: 3.5 mmol/L (ref 3.5–5.1)
Sodium: 139 mmol/L (ref 135–145)
Total Bilirubin: 1.5 mg/dL — ABNORMAL HIGH (ref 0.3–1.2)
Total Protein: 7.2 g/dL (ref 6.5–8.1)

## 2022-04-16 LAB — CBC WITH DIFFERENTIAL/PLATELET
Abs Immature Granulocytes: 0.02 10*3/uL (ref 0.00–0.07)
Basophils Absolute: 0 10*3/uL (ref 0.0–0.1)
Basophils Relative: 0 %
Eosinophils Absolute: 0.1 10*3/uL (ref 0.0–0.5)
Eosinophils Relative: 1 %
HCT: 37.9 % — ABNORMAL LOW (ref 39.0–52.0)
Hemoglobin: 12.7 g/dL — ABNORMAL LOW (ref 13.0–17.0)
Immature Granulocytes: 0 %
Lymphocytes Relative: 20 %
Lymphs Abs: 1 10*3/uL (ref 0.7–4.0)
MCH: 31 pg (ref 26.0–34.0)
MCHC: 33.5 g/dL (ref 30.0–36.0)
MCV: 92.4 fL (ref 80.0–100.0)
Monocytes Absolute: 0.3 10*3/uL (ref 0.1–1.0)
Monocytes Relative: 5 %
Neutro Abs: 3.7 10*3/uL (ref 1.7–7.7)
Neutrophils Relative %: 74 %
Platelets: 210 10*3/uL (ref 150–400)
RBC: 4.1 MIL/uL — ABNORMAL LOW (ref 4.22–5.81)
RDW: 13.3 % (ref 11.5–15.5)
WBC: 5.1 10*3/uL (ref 4.0–10.5)
nRBC: 0 % (ref 0.0–0.2)

## 2022-04-16 LAB — LIPASE, BLOOD: Lipase: 31 U/L (ref 11–51)

## 2022-04-16 LAB — URINALYSIS, ROUTINE W REFLEX MICROSCOPIC
Bilirubin Urine: NEGATIVE
Glucose, UA: NEGATIVE mg/dL
Ketones, ur: NEGATIVE mg/dL
Nitrite: NEGATIVE
Protein, ur: 30 mg/dL — AB
Specific Gravity, Urine: 1.012 (ref 1.005–1.030)
WBC, UA: >50 WBC/hpf — ABNORMAL HIGH (ref 0–5)
pH: 6 (ref 5.0–8.0)

## 2022-04-16 MED ORDER — ONDANSETRON HCL 4 MG/2ML IJ SOLN
4.0000 mg | Freq: Once | INTRAMUSCULAR | Status: AC
  Administered 2022-04-16: 4 mg via INTRAVENOUS
  Filled 2022-04-16: qty 2

## 2022-04-16 MED ORDER — MORPHINE SULFATE (PF) 4 MG/ML IV SOLN
4.0000 mg | Freq: Once | INTRAVENOUS | Status: AC
  Administered 2022-04-16: 4 mg via INTRAVENOUS
  Filled 2022-04-16: qty 1

## 2022-04-16 MED ORDER — CEPHALEXIN 500 MG PO CAPS: 500.0000 mg | ORAL_CAPSULE | Freq: Four times a day (QID) | ORAL | 0 refills | Status: DC

## 2022-04-16 MED ORDER — SODIUM CHLORIDE 0.9 % IV SOLN
1.0000 g | Freq: Once | INTRAVENOUS | Status: AC
  Administered 2022-04-16: 1 g via INTRAVENOUS
  Filled 2022-04-16: qty 10

## 2022-04-16 MED ORDER — LACTATED RINGERS IV BOLUS
1000.0000 mL | Freq: Once | INTRAVENOUS | Status: AC
  Administered 2022-04-16: 1000 mL via INTRAVENOUS

## 2022-04-16 MED ORDER — IOHEXOL 350 MG/ML SOLN
75.0000 mL | Freq: Once | INTRAVENOUS | Status: AC | PRN
  Administered 2022-04-16: 75 mL via INTRAVENOUS

## 2022-04-16 NOTE — ED Provider Notes (Signed)
[facility] EMERGENCY DEPARTMENT Provider Note   CSN: [id number] Arrival date & time: 04/16/22  1158     History  Chief Complaint  Patient presents with   Abdominal Pain    Brendan [name] is a 81 y.o. male.  Pt with hx Parkinson's disease, cad, c/o left lower abd pain in the past 1-2 days. Symptoms acute onset, moderate, persistent, non radiating. No vomiting. Having normal bms. No fever/chills. No dysuria or gu c/o. No back/flank pain.   The history is provided by the patient, medical records and the EMS personnel.  Abdominal Pain Associated symptoms: no chest pain, no dysuria, no fever, no hematuria, no shortness of breath, no sore throat and no vomiting        Home Medications Prior to Admission medications   Medication Sig Start Date End Date Taking? Authorizing Provider  albuterol (VENTOLIN HFA) 108 (90 Base) MCG/ACT inhaler Inhale 2 puffs into the lungs every 4 (four) hours as needed for wheezing or shortness of breath. 12/23/21   Erick Alley, DO  aspirin EC 81 MG tablet Take 1 tablet (81 mg total) by mouth daily. 02/23/22   Dawley, Troy C, DO  atorvastatin (LIPITOR) 20 MG tablet Take 1 tablet (20 mg total) by mouth daily. 06/13/21 02/16/22  Marykay Lex, MD  Cyanocobalamin (VITAMIN B 12) 500 MCG TABS Take 1,000 mcg by mouth daily. Patient taking differently: Take 5,000 mcg by mouth daily. 08/04/20   Tat, Octaviano Batty, DO  fluticasone (FLONASE) 50 MCG/ACT nasal spray Place 1 spray into both nostrils daily as needed for allergies. 04/21/21   Autry-Lott, Randa Evens, DO  Hyprom-Naphaz-Polysorb-Zn Sulf (CLEAR EYES COMPLETE OP) Place 1 drop into both eyes daily as needed (dry eyes).    [provider]  loratadine (ALLERGY RELIEF) 10 MG tablet Take 1 tablet (10 mg total) by mouth daily. 12/23/21   Erick Alley, DO  Melatonin 3 MG TABS Take 1 tablet (3 mg total) by mouth at bedtime. Patient taking differently: Take 5 mg by mouth at bedtime. 06/08/17   Angiulli,  Mcarthur Rossetti, PA-C  methocarbamol (ROBAXIN) 750 MG tablet TAKE 1 TABLET BY MOUTH TWICE DAILY AS NEEDED FOR MUSCLE SPASMS 03/13/22   Erick Alley, DO  nitroGLYCERIN (NITROSTAT) 0.4 MG SL tablet Place 1 tablet (0.4 mg total) under the tongue every 5 (five) minutes as needed for chest pain. call 911 if chest pain not better 03/05/18   [name], MD  nystatin cream (MYCOSTATIN) APPLY TOPICALLY TO RASH FOUR TIMES DAILYFOR TWO WEEKS Patient taking differently: Apply 1 Application topically at bedtime as needed (Rash). 06/10/21   Autry-Lott, Randa Evens, DO  omeprazole (PRILOSEC) 20 MG capsule TAKE 1 CAPSULE BY MOUTH DAILY AS NEEDED FOR ACID REFLUX 02/02/22   Alicia Amel, MD  traMADol (ULTRAM) 50 MG tablet Take 1 tablet (50 mg total) by mouth every 6 (six) hours as needed. 02/16/22 02/16/23  Dawley, Troy C, DO  traMADol (ULTRAM) 50 MG tablet Take 1 tablet (50 mg total) by mouth every 6 (six) hours as needed for moderate pain. 04/04/22   Erick Alley, DO  traZODone (DESYREL) 100 MG tablet TAKE 1 TABLET BY MOUTH EACH NIGHT AT BEDTIME 03/24/22   Erick Alley, DO  Zoster Vaccine Adjuvanted [facility]) injection Inject 0.5 ml IM and Repeat in 2 months 01/04/22   Carney Living, MD      Allergies    Lisinopril    Review of Systems   Review of Systems  Constitutional:  Negative for  fever.  HENT:  Negative for sore throat.   Eyes:  Negative for redness.  Respiratory:  Negative for shortness of breath.   Cardiovascular:  Negative for chest pain.  Gastrointestinal:  Positive for abdominal pain. Negative for vomiting.  Genitourinary:  Negative for dysuria, flank pain and hematuria.  Musculoskeletal:  Negative for back pain.  Skin:  Negative for rash.  Neurological:  Negative for headaches.  Hematological:  Does not bruise/bleed easily.  Psychiatric/Behavioral:  Negative for confusion.     Physical Exam Updated Vital Signs BP (!) 171/76   Pulse 74   Temp 98.1 F (36.7 C) (Oral)   Resp 19   Ht  1.702 m (5\' 7" )   Wt 80.3 kg   SpO2 96%   BMI 27.73 kg/m  Physical Exam Vitals and nursing note reviewed.  Constitutional:      Appearance: Normal appearance. He is well-developed.  HENT:     Head: Atraumatic.     Nose: Nose normal.     Mouth/Throat:     Mouth: Mucous membranes are moist.     Pharynx: Oropharynx is clear.  Eyes:     General: No scleral icterus.    Conjunctiva/sclera: Conjunctivae normal.     Pupils: Pupils are equal, round, and reactive to light.  Neck:     Trachea: No tracheal deviation.  Cardiovascular:     Rate and Rhythm: Normal rate and regular rhythm.     Pulses: Normal pulses.     Heart sounds: Normal heart sounds. No murmur heard.    No friction rub. No gallop.  Pulmonary:     Effort: Pulmonary effort is normal. No accessory muscle usage or respiratory distress.     Breath sounds: Normal breath sounds.  Abdominal:     General: Bowel sounds are normal. There is no distension.     Palpations: Abdomen is soft. There is no mass.     Tenderness: There is abdominal tenderness. There is no guarding or rebound.     Hernia: No hernia is present.     Comments: Left mid to lower abdominal tenderness.   Genitourinary:    Comments: No cva tenderness. Musculoskeletal:        General: No swelling.     Cervical back: Normal range of motion and neck supple. No rigidity.  Skin:    General: Skin is warm and dry.     Findings: No rash.     Comments: No skin lesions/rash to area of pain.   Neurological:     Mental Status: He is alert.     Comments: Alert, speech clear.   Psychiatric:        Mood and Affect: Mood normal.     ED Results / Procedures / Treatments   Labs (all labs ordered are listed, but only abnormal results are displayed) Results for orders placed or performed during the hospital encounter of 04/16/22  CBC with Differential  Result Value Ref Range   WBC 5.1 4.0 - 10.5 K/uL   RBC 4.10 (L) 4.22 - 5.81 MIL/uL   Hemoglobin 12.7 (L) 13.0 -  17.0 g/dL   HCT 04/18/22 (L) 35.4 - 65.6 %   MCV 92.4 80.0 - 100.0 fL   MCH 31.0 26.0 - 34.0 pg   MCHC 33.5 30.0 - 36.0 g/dL   RDW 81.2 75.1 - 70.0 %   Platelets 210 150 - 400 K/uL   nRBC 0.0 0.0 - 0.2 %   Neutrophils Relative % 74 %   Neutro  Abs 3.7 1.7 - 7.7 K/uL   Lymphocytes Relative 20 %   Lymphs Abs 1.0 0.7 - 4.0 K/uL   Monocytes Relative 5 %   Monocytes Absolute 0.3 0.1 - 1.0 K/uL   Eosinophils Relative 1 %   Eosinophils Absolute 0.1 0.0 - 0.5 K/uL   Basophils Relative 0 %   Basophils Absolute 0.0 0.0 - 0.1 K/uL   Immature Granulocytes 0 %   Abs Immature Granulocytes 0.02 0.00 - 0.07 K/uL  Comprehensive metabolic panel  Result Value Ref Range   Sodium 139 135 - 145 mmol/L   Potassium 3.5 3.5 - 5.1 mmol/L   Chloride 100 98 - 111 mmol/L   CO2 27 22 - 32 mmol/L   Glucose, Bld 165 (H) 70 - 99 mg/dL   BUN 8 8 - 23 mg/dL   Creatinine, Ser 9.35 0.61 - 1.24 mg/dL   Calcium 9.2 8.9 - 70.1 mg/dL   Total Protein 7.2 6.5 - 8.1 g/dL   Albumin 3.4 (L) 3.5 - 5.0 g/dL   AST 18 15 - 41 U/L   ALT 12 0 - 44 U/L   Alkaline Phosphatase 92 38 - 126 U/L   Total Bilirubin 1.5 (H) 0.3 - 1.2 mg/dL   GFR, Estimated >77 >93 mL/min   Anion gap 12 5 - 15  Lipase, blood  Result Value Ref Range   Lipase 31 11 - 51 U/L  Urinalysis, Routine w reflex microscopic Urine, Catheterized  Result Value Ref Range   Color, Urine YELLOW YELLOW   APPearance CLOUDY (A) CLEAR   Specific Gravity, Urine 1.012 1.005 - 1.030   pH 6.0 5.0 - 8.0   Glucose, UA NEGATIVE NEGATIVE mg/dL   Hgb urine dipstick SMALL (A) NEGATIVE   Bilirubin Urine NEGATIVE NEGATIVE   Ketones, ur NEGATIVE NEGATIVE mg/dL   Protein, ur 30 (A) NEGATIVE mg/dL   Nitrite NEGATIVE NEGATIVE   Leukocytes,Ua LARGE (A) NEGATIVE   RBC / HPF 0-5 0 - 5 RBC/hpf   WBC, UA >50 (H) 0 - 5 WBC/hpf   Bacteria, UA FEW (A) NONE SEEN   Squamous Epithelial / LPF 0-5 0 - 5 /HPF   Mucus PRESENT    Hyaline Casts, UA PRESENT     EKG None  Radiology CT  ABDOMEN PELVIS W CONTRAST  Result Date: 04/16/2022 CLINICAL DATA:  Left upper quadrant pain. EXAM: CT ABDOMEN AND PELVIS WITH CONTRAST TECHNIQUE: Multidetector CT imaging of the abdomen and pelvis was performed using the standard protocol following bolus administration of intravenous contrast. RADIATION DOSE REDUCTION: This exam was performed according to the departmental dose-optimization program which includes automated exposure control, adjustment of the mA and/or kV according to patient size and/or use of iterative reconstruction technique. CONTRAST:  26mL OMNIPAQUE IOHEXOL 350 MG/ML SOLN COMPARISON:  04/05/2011 FINDINGS: Lower Chest: No acute findings. Chronic left basilar pleural thickening and pleural-parenchymal scarring. Hepatobiliary: No hepatic masses identified. Several gallstones are again noted, however there is no evidence of cholecystitis or biliary ductal dilatation. Pancreas:  No mass or inflammatory changes. Spleen: Within normal limits in size and appearance. Adrenals/Urinary Tract: No suspicious masses identified. Mild-to-moderate bilateral hydroureteronephrosis is seen the level of the urinary bladder, which is new since prior study. No ureteral calculi are identified. The bladder is distended with mild diffuse wall thickening and trabeculation, consistent with chronic bladder outlet obstruction. Stomach/Bowel: No evidence of obstruction, inflammatory process or abnormal fluid collections. Vascular/Lymphatic: No pathologically enlarged lymph nodes. No acute vascular findings. Aortic atherosclerotic calcification incidentally noted. Reproductive:  Mildly enlarged prostate gland with TURP defect. Other:  None. Musculoskeletal:  No suspicious bone lesions identified. IMPRESSION: Mildly enlarged prostate gland with findings of chronic bladder outlet obstruction. New mild-to-moderate bilateral hydroureteronephrosis to the level of the urinary bladder. No ureteral calculi or other obstructing  etiology visualized by CT. This is likely due vesicoureteral reflux secondary to chronic bladder outlet obstruction. Cholelithiasis. No radiographic evidence of cholecystitis. Aortic Atherosclerosis (ICD10-I70.0). Electronically Signed   By: Danae OrleansJohn A Stahl M.D.   On: 04/16/2022 15:15    Procedures Procedures    Medications Ordered in ED Medications  cefTRIAXone (ROCEPHIN) 1 g in sodium chloride 0.9 % 100 mL IVPB (has no administration in time range)  morphine (PF) 4 MG/ML injection 4 mg (4 mg Intravenous Given 04/16/22 1417)  ondansetron (ZOFRAN) injection 4 mg (4 mg Intravenous Given 04/16/22 1417)  lactated ringers bolus 1,000 mL (0 mLs Intravenous Stopped 04/16/22 1613)  iohexol (OMNIPAQUE) 350 MG/ML injection 75 mL (75 mLs Intravenous Contrast Given 04/16/22 1455)    ED Course/ Medical Decision Making/ A&P                           Medical Decision Making Problems Addressed: Acute UTI: acute illness or injury with systemic symptoms that poses a threat to life or bodily functions Gallstones: chronic illness or injury Left sided abdominal pain: acute illness or injury with systemic symptoms that poses a threat to life or bodily functions Urinary retention: acute illness or injury  Amount and/or Complexity of Data Reviewed Independent Historian: spouse and EMS    Details: hx External Data Reviewed: notes. Labs: ordered. Decision-making details documented in ED Course. Radiology: ordered and independent interpretation performed. Decision-making details documented in ED Course.  Risk Prescription drug management. Parenteral controlled substances. Decision regarding hospitalization.  Iv ns. Continuous pulse ox and cardiac monitoring. Labs ordered/sent. Imaging ordered.   Diff dx includes diverticulitis, abscess, ureteral obstruction, constipation, etc - dispo decision including potential need for admission considered - will get labs and imaging and reassess.   Reviewed nursing  notes and prior charts for additional history. External reports reviewed. Additional history from: EMS.  Morphine iv. Zofran iv. Ivf bolus.   Cardiac monitor: sinus rhythm, rate 90.  Labs reviewed/interpreted by me - wbc normal.   CT reviewed/interpreted by me - no diverticulitis. +distended bladder.   Foley placed. ~ 800 cc urine in  bag. Leg bag. Recheck abd soft nt.   Po fluids/food.  Additional labs reviewed/interpreted by me - ua w > 50 wbc. U cx sent. Rocephin iv.    Overall pt is well, not toxic appearing, comfortable, no distress, normal vitals, with benign abd exam.   Pt currently appears stable for d/c.   Rec close pcp/urology follow up.  Return precautions provided.           Final Clinical Impression(s) / ED Diagnoses Final diagnoses:  Left sided abdominal pain  Urinary retention  Acute UTI  Gallstones    Rx / DC Orders ED Discharge Orders     None         Cathren LaineSteinl, Goodwin Kamphaus, MD 04/16/22 1805

## 2022-04-16 NOTE — ED Notes (Signed)
Lab Agreed to add on urine culture

## 2022-04-16 NOTE — Discharge Instructions (Addendum)
It was our pleasure to provide your ER care today - we hope that you feel better.  The lab tests show a urine infection. Take antibiotic as prescribed. Make sure to drink plenty of fluids/stay well hydrated.   Your CT scan was read as showing: 1. Mildly enlarged prostate gland with findings of chronic bladder outlet obstruction. New mild to moderate bilateral hydroureteronephrosis to the level of the urinary bladder. No ureteral calculi or other obstructing etiology visualized by CT. This is likely due vesicoureteral reflux secondary to chronic bladder outlet obstruction. 2. Incidental note of Cholelithiasis (gallstones). No radiographic evidence of cholecystitis. 3. Aortic Atherosclerosis   Empty leg bag as need. Follow up with urologist in the coming week - call office Tuesday to arrange appointment.   Return to ER if worse, new symptoms, fevers, new/severe pain, severe abdominal pain, persistent vomiting, chest pain, trouble breathing, or other emergency concern.

## 2022-04-16 NOTE — ED Triage Notes (Addendum)
PT here from home via [facility] for L upper quadrant and L rib pain.  Denies nausea or vomiting.  Last bm yesterday. Denies fall.  Wife states pt has stimulators in his chest.    Bp 174/68 Hr 72 O2 98% RA Cbg 167

## 2022-04-16 NOTE — ED Provider Triage Note (Signed)
Emergency Medicine Provider Triage Evaluation Note  [name] , a 81 y.o. male  was evaluated in triage.  Pt complains of abd pain. LUQ pain since yesterday with mild sob.  No fever, n/v/d. Poor historian, no dysuria  Review of Systems  Positive: As above Negative: As above  Physical Exam  BP (!) 165/86   Pulse 90   Temp (!) 97.4 F (36.3 C) (Oral)   Resp 16   Ht 5\' 7"  (1.702 m)   Wt 80.3 kg   SpO2 97%   BMI 27.73 kg/m  Gen:   Awake, no distress   Resp:  Normal effort  MSK:   Moves extremities without difficulty  Other:    Medical Decision Making  Medically screening exam initiated at 12:24 PM.  Appropriate orders placed.  BRANDO TAVES was informed that the remainder of the evaluation will be completed by another provider, this initial triage assessment does not replace that evaluation, and the importance of remaining in the ED until their evaluation is complete.     [name], PA-C 04/16/22 1226

## 2022-05-02 ENCOUNTER — Other Ambulatory Visit: Payer: Self-pay

## 2022-05-02 LAB — URINE CULTURE: Culture: >=100000 — AB

## 2022-05-02 MED ORDER — LORATADINE 10 MG PO TABS: 10.0000 mg | ORAL_TABLET | Freq: Every day | ORAL | 2 refills | Status: DC

## 2022-05-03 ENCOUNTER — Telehealth (HOSPITAL_BASED_OUTPATIENT_CLINIC_OR_DEPARTMENT_OTHER): Payer: Self-pay

## 2022-05-03 NOTE — Progress Notes (Signed)
ED Antimicrobial Stewardship Positive Culture Follow Up   Brendan [name] is an 82 y.o. male who presented to [facility] on 04/16/2022 with a chief complaint of  Chief Complaint  Patient presents with   Abdominal Pain    Recent Results (from the past 720 hour(s))  Urine Culture     Status: Abnormal   Collection Time: 04/16/22 12:27 PM   Specimen: Urine, Catheterized  Result Value Ref Range Status   Specimen Description URINE, CATHETERIZED  Final   Special Requests NONE  Final   Culture (A)  Final    >=100,000 COLONIES/mL [place] NOTIFIED Referred to [facility] in [place], [place] for serotyping. Performed at [name], [place] [address], [place], [place] [address]    Report Status 05/02/2022 FINAL  Final   Organism ID, Bacteria SALMONELLA ENTERITIDIS (A)  Final      Susceptibility   Salmonella enteritidis - MIC*    AMPICILLIN <=2 SENSITIVE Sensitive     LEVOFLOXACIN <=0.12 SENSITIVE Sensitive     TRIMETH/SULFA <=20 SENSITIVE Sensitive     * >=100,000 COLONIES/mL SALMONELLA ENTERITIDIS    Patient discharged on cephalexin found to have Salmonella UTI. Salmonella is not a common urinary pathogen, more likely causes of GI infections. Cephalexin does not adequately cover Salmonella infections. Recommend calling patient to inquire about whether symptoms having improved. If still present, recommend PCP f/u given that this culture was obtained greater than two weeks ago. Will not prescribe new antibiotic before being evaluated again.  ED Provider: Marda Stalker, MD   Merrilee Jansky, PharmD 05/03/2022, 10:43 AM Clinical Pharmacist Monday - Friday phone -  [phone] Saturday - Sunday phone - [phone]

## 2022-05-03 NOTE — Telephone Encounter (Signed)
Post ED Visit - Positive Culture Follow-up: Unsuccessful Patient Follow-up  Culture assessed and recommendations reviewed by:  [x]  Erskine Speed, Pharm.D. []  Heide Guile, Pharm.D., BCPS AQ-ID []  Parks Neptune, Pharm.D., BCPS []  Alycia Rossetti, Pharm.D., BCPS []  [place], Pharm.D., BCPS, AAHIVP []  Legrand Como, Pharm.D., BCPS, AAHIVP []  Wynell Balloon, PharmD []  Vincenza Hews, PharmD, BCPS  Positive urine culture with Salmonella  []  Patient discharged without antimicrobial prescription and treatment is now indicated [x]  Organism is resistant to prescribed ED discharge antimicrobial []  Patient with positive blood cultures  Plan: Call for symptoms check if symptoms present recommend pt follow up with their PCP as organism rarely causes UTI. Reviewed by ED provider Christpher Tegeler, MD   Unable to contact patient after 3 attempts, letter will be sent to address on file  Glennon Hamilton 05/03/2022, 11:52 AM

## 2022-05-12 ENCOUNTER — Ambulatory Visit (INDEPENDENT_AMBULATORY_CARE_PROVIDER_SITE_OTHER): Payer: PPO | Admitting: Family Medicine

## 2022-05-12 VITALS — BP 148/62 | HR 84 | Ht 67.0 in | Wt 157.0 lb

## 2022-05-12 DIAGNOSIS — R3914 Feeling of incomplete bladder emptying: Secondary | ICD-10-CM

## 2022-05-12 DIAGNOSIS — F0390 Unspecified dementia without behavioral disturbance: Secondary | ICD-10-CM | POA: Diagnosis not present

## 2022-05-12 DIAGNOSIS — N390 Urinary tract infection, site not specified: Secondary | ICD-10-CM | POA: Diagnosis not present

## 2022-05-12 DIAGNOSIS — N401 Enlarged prostate with lower urinary tract symptoms: Secondary | ICD-10-CM | POA: Diagnosis not present

## 2022-05-12 NOTE — Patient Instructions (Signed)
Good to see you today - Thank you for coming in  Things we discussed today:  1) For your urinary symptoms, these may be due to ongoing infection OR BPH (benign prostate hyperplasia). - Collect a catheter urine sample and bring it back to our clinic Monday. Collect the urine within 24 hours of dropping it off and keep it refrigerated.  - Call your Urologist ASAP and make an appointment for early next week to be seen for urinary retention and hydronephrosis.   2) For your dementia, I recommend talking to his Neurologist and mentioning concerns for Vascular Dementia or potential stroke 2 months ago. They can assist with further treatment or testing.  Please come back to see [place] in the next month for a follow-up

## 2022-05-12 NOTE — Progress Notes (Unsigned)
    SUBJECTIVE:   CHIEF COMPLAINT / HPI:   JK is a 82yo M w/ hx of Alzheimer dementia, BPH, HTN, CAD s/p CABG, COPD, T2DM, SAH that present for UTI follow-up. His wife is primary care taker, his daughter also helps with his care.   UTI  Urinary Symptoms Pt presented to ED 04/16/22 for lower abm pain, was tx for UTI w/ Keflex, then later Ucx grew salmonella enteritidis. Pt contunues to have lower abm pain, lower back pain, and cloudiness and darker color in urine. Denies fevers. Pt does have hx of BPH, was on flomax but stopped because it was not helpful. He has a Dealer.   Dementia  Family is worried about worsening dementia in pt. They report that he has had more falls recently. They feel like he had an rapid decline in function about 2 months ago, noting more L sided weakness since then. He follows w/ Neurology for Alzheimers dementia.  Additionally, family is working on getting home health.    OBJECTIVE:   BP (!) 148/62   Pulse 84   Ht 5\' 7"  (1.702 m)   Wt 157 lb (71.2 kg)   SpO2 97%   BMI 24.59 kg/m   Gen: Chronically ill appearing older man sitting in wheelchair. Minimally verbal but able to answer yes-or-no questions. HEENT: PERRLA Resp: CTAB. Normal WOB on RA CV: RRR Abm: R sided abm tenderness. No guarding, rigiditiy, rebound. Normal BS. CVA tenderness present BL.   ASSESSMENT/PLAN:   UTI (urinary tract infection) Pt recently tx for UTI 04/16/22 in ED. Pt received Keflex. Ucx grew slamonella enteritidis (an uncommon source of UTI). CT scan at the time showed BL hydronephrosis. Pt has hx of BPH leading to bladder obstruction, wife I/O caths him at home. Afebrile, but still having abm tenderness and BL CVA tenderness today. Differential includes ongoing UTI vs hydronephrosis/BPH.  - Repeat U/A and Ucx (unable to obtain in clinic. Instructed family to bring back a urine sample from home). If still c/f infection, will send in appropriate antibx course. - Discussed making  f/u w/ Urologist to discuss BPH. Scheduled for next Wednesday.  BPH (benign prostatic hyperplasia) Found to have BL hydronephrosis on CT 04/16/22 likely 2/2 BPH obstruction. Has trialed flomax in past but stopped due to limited benefit. - f/u w/ Urologist as above - Cont as needed I/O caths at home  Dementia [facility]) Brush w/ Neurology for dementia concerns. Family concerned about recent declining function. Reports that he has rapid decline ~71months ago and concern for more weakness on L side. Pt is in wheelchair at baseline, wife is primary caretaker and daughter helps. Family is working to get [place] for him. C/f potential vascular dementia given step-wise nature of decline. PT has follow-up with neurology early next week to discuss dementia concerns. - Family working to get home health services - f/u w/ Neurologist  Arlyce Dice, MD [place]

## 2022-05-13 NOTE — Progress Notes (Unsigned)
Assessment/Plan:    1.   Essential Tremor             Patient is status post bilateral DBS to the bilateral VIM on May 04, 2017.  Patient had postoperative bleed on the left, with resultant hematoma causing paralysis on the right, which has since resolved.  Because of complications, the patient's IPG was not placed until July, 2019.  pt had bilateral IPG change on 02/16/22  -DaTscan was just slightly positive.  He subsequently had a negative skin biopsy for alpha-synuclein.  I have always believed that his balance changes are likely from myelopathy.  He is spastic when he ambulates.    -Because of the above, I decided to go ahead and stop his levodopa.  He has gotten more confused with time and although I really do not think that the levodopa is the cause (he has had long-term dementia), I do not necessarily think it has been that beneficial.    2.  History of myoclonus.             -This was related to gabapentin.  Resolved when gabapentin was discontinued.  Neurosurgery has restarted the gabapentin post cervical spine surgery.  Am concerned about that, both because of myoclonus and because of the fact that he has developed dementia.  They have a follow-up appointment with neurosurgery and talk to them about seeing if we could wean that down.   3.  dementia  -Wife taking full-time care of the patient now.  There is some concern about her memory as well, both from myself as well as the patient's daughter.  She, in fact, states that she is being taken care of by a different neurology group for her memory.  Family has appt with attorney upcoming   4.  History of cervical myelopathy  -Status post cervical decompression with Dr. Vertell Limber August, 2022  -Repeat MRI cervical spine with continued evidence of severe spinal stenosis at C3-C4.  Patient following with Dr. Reatha Armour.  I agree with Dr. Reatha Armour about concerns about putting the patient through another major surgery, but also have concerns  about the degree of myelopathy that the patient has, as well as clinical deterioration.   5.  blepharospasm  -Not interested in Botox.    Subjective:   Brendan [name] was seen today, accompanied by his daughter who supplements the history.  Records reviewed from previous.  Patient had bilateral IPG change on February 16, 2022 with Dr. Reatha Armour.  He was in the emergency room December 31 with abdominal pain.  He was treated for urinary retention.  Gallstones were also identified.  He was also treated for possible urinary tract infection.  He was discharged home from the emergency room.  Carbidopa/levodopa 25/100, 1 tablet 3 times per day.   ALLERGIES:   Allergies  Allergen Reactions   Lisinopril Cough    CURRENT MEDICATIONS:  Outpatient Encounter Medications as of 05/15/2022  Medication Sig   albuterol (VENTOLIN HFA) 108 (90 Base) MCG/ACT inhaler Inhale 2 puffs into the lungs every 4 (four) hours as needed for wheezing or shortness of breath.   aspirin EC 81 MG tablet Take 1 tablet (81 mg total) by mouth daily.   atorvastatin (LIPITOR) 20 MG tablet Take 1 tablet (20 mg total) by mouth daily.   cephALEXin (KEFLEX) 500 MG capsule Take 1 capsule (500 mg total) by mouth 4 (four) times daily.   cephALEXin (KEFLEX) 500 MG capsule Take 1 capsule (500 mg total)  by mouth 4 (four) times daily.   Cyanocobalamin (VITAMIN B 12) 500 MCG TABS Take 1,000 mcg by mouth daily. (Patient taking differently: Take 5,000 mcg by mouth daily.)   fluticasone (FLONASE) 50 MCG/ACT nasal spray Place 1 spray into both nostrils daily as needed for allergies.   Hyprom-Naphaz-Polysorb-Zn Sulf (CLEAR EYES COMPLETE OP) Place 1 drop into both eyes daily as needed (dry eyes).   loratadine (ALLERGY RELIEF) 10 MG tablet Take 1 tablet (10 mg total) by mouth daily.   Melatonin 3 MG TABS Take 1 tablet (3 mg total) by mouth at bedtime. (Patient taking differently: Take 5 mg by mouth at bedtime.)   methocarbamol (ROBAXIN) 750 MG  tablet TAKE 1 TABLET BY MOUTH TWICE DAILY AS NEEDED FOR MUSCLE SPASMS   nitroGLYCERIN (NITROSTAT) 0.4 MG SL tablet Place 1 tablet (0.4 mg total) under the tongue every 5 (five) minutes as needed for chest pain. call 911 if chest pain not better   nystatin cream (MYCOSTATIN) APPLY TOPICALLY TO RASH FOUR TIMES DAILYFOR TWO WEEKS (Patient taking differently: Apply 1 Application topically at bedtime as needed (Rash).)   omeprazole (PRILOSEC) 20 MG capsule TAKE 1 CAPSULE BY MOUTH DAILY AS NEEDED FOR ACID REFLUX   traMADol (ULTRAM) 50 MG tablet Take 1 tablet (50 mg total) by mouth every 6 (six) hours as needed.   traMADol (ULTRAM) 50 MG tablet Take 1 tablet (50 mg total) by mouth every 6 (six) hours as needed for moderate pain.   traZODone (DESYREL) 100 MG tablet TAKE 1 TABLET BY MOUTH EACH NIGHT AT BEDTIME   Zoster Vaccine Adjuvanted [facility]) injection Inject 0.5 ml IM and Repeat in 2 months   Facility-Administered Encounter Medications as of 05/15/2022  Medication   0.9 %  sodium chloride infusion   sodium chloride 0.9 % injection 3 mL     Objective:    PHYSICAL EXAMINATION:    VITALS:   There were no vitals filed for this visit.   GEN:  The patient appears stated age and is in NAD. HEENT:  Normocephalic, atraumatic.  The mucous membranes are moist.  Cardiovascular: Regular rate rhythm Lungs: Clear to auscultation bilaterally Neck: No bruits Skin:  biopsy sites were clean and well healed  Neurological examination:  Orientation: The patient is alert and oriented to person and place.  He has trouble with details but is able to recall Dr. Jake Samples and asks me about him. Tone: Normal Cranial nerves: There is good facial symmetry. The speech is somewhat dysphasic, but he is clear. Soft palate rises symmetrically and there is no tongue deviation. Hearing is intact to conversational tone.  Mild blepharospasm today. Motor: Strength is at least antigravity x4.   Coordination: He has no  decremation with rapid alternating movements.  He is somewhat apraxic. Abnormal movements: there is some degree of rest tremor on both sides that is not nearly as significant as the intention tremor.  When his device is turned off, his tremor is much worse, and there is even rest tremor of the R leg    I have reviewed and interpreted the following labs independently   Chemistry      Component Value Date/Time   NA 139 04/16/2022 1227   NA 139 01/04/2022 1237   K 3.5 04/16/2022 1227   CL 100 04/16/2022 1227   CO2 27 04/16/2022 1227   BUN 8 04/16/2022 1227   BUN 11 01/04/2022 1237   CREATININE 1.16 04/16/2022 1227   CREATININE 1.02 05/01/2016 1118  Component Value Date/Time   CALCIUM 9.2 04/16/2022 1227   ALKPHOS 92 04/16/2022 1227   AST 18 04/16/2022 1227   ALT 12 04/16/2022 1227   BILITOT 1.5 (H) 04/16/2022 1227   BILITOT 1.5 (H) 01/04/2022 1237     Lab Results  Component Value Date   VITAMINB12 255 07/26/2020    Total time spent on today's visit was *** minutes, including both face-to-face time and nonface-to-face time.  Time included that spent on review of records (prior notes available to me/labs/imaging if pertinent), discussing treatment and goals, answering patient's questions and coordinating care.  This was independent of DBS time.  Cc:  Precious Gilding, DO

## 2022-05-15 ENCOUNTER — Other Ambulatory Visit: Payer: Self-pay

## 2022-05-15 ENCOUNTER — Ambulatory Visit: Payer: PPO | Admitting: Neurology

## 2022-05-15 ENCOUNTER — Emergency Department (HOSPITAL_BASED_OUTPATIENT_CLINIC_OR_DEPARTMENT_OTHER): Payer: PPO

## 2022-05-15 ENCOUNTER — Encounter: Payer: Self-pay | Admitting: Neurology

## 2022-05-15 ENCOUNTER — Inpatient Hospital Stay (HOSPITAL_BASED_OUTPATIENT_CLINIC_OR_DEPARTMENT_OTHER)
Admission: EM | Admit: 2022-05-15 | Discharge: 2022-05-20 | DRG: 698 | Disposition: A | Discharge status: Hospice | Payer: PPO | Attending: Family Medicine | Admitting: Family Medicine

## 2022-05-15 VITALS — BP 124/72 | HR 73 | Ht 67.0 in | Wt 157.0 lb

## 2022-05-15 DIAGNOSIS — Z66 Do not resuscitate: Secondary | ICD-10-CM | POA: Diagnosis present

## 2022-05-15 DIAGNOSIS — R4701 Aphasia: Secondary | ICD-10-CM | POA: Diagnosis present

## 2022-05-15 DIAGNOSIS — Z9689 Presence of other specified functional implants: Secondary | ICD-10-CM | POA: Diagnosis not present

## 2022-05-15 DIAGNOSIS — G9389 Other specified disorders of brain: Secondary | ICD-10-CM | POA: Diagnosis present

## 2022-05-15 DIAGNOSIS — G250 Essential tremor: Secondary | ICD-10-CM

## 2022-05-15 DIAGNOSIS — R296 Repeated falls: Secondary | ICD-10-CM | POA: Diagnosis present

## 2022-05-15 DIAGNOSIS — R200 Anesthesia of skin: Secondary | ICD-10-CM

## 2022-05-15 DIAGNOSIS — Y732 Prosthetic and other implants, materials and accessory gastroenterology and urology devices associated with adverse incidents: Secondary | ICD-10-CM | POA: Diagnosis present

## 2022-05-15 DIAGNOSIS — F0280 Dementia in other diseases classified elsewhere without behavioral disturbance: Secondary | ICD-10-CM | POA: Diagnosis not present

## 2022-05-15 DIAGNOSIS — I10 Essential (primary) hypertension: Secondary | ICD-10-CM | POA: Diagnosis present

## 2022-05-15 DIAGNOSIS — F0390 Unspecified dementia without behavioral disturbance: Secondary | ICD-10-CM | POA: Diagnosis present

## 2022-05-15 DIAGNOSIS — I2510 Atherosclerotic heart disease of native coronary artery without angina pectoris: Secondary | ICD-10-CM | POA: Diagnosis present

## 2022-05-15 DIAGNOSIS — N390 Urinary tract infection, site not specified: Secondary | ICD-10-CM | POA: Diagnosis not present

## 2022-05-15 DIAGNOSIS — E876 Hypokalemia: Secondary | ICD-10-CM | POA: Diagnosis not present

## 2022-05-15 DIAGNOSIS — G8191 Hemiplegia, unspecified affecting right dominant side: Secondary | ICD-10-CM | POA: Diagnosis present

## 2022-05-15 DIAGNOSIS — F419 Anxiety disorder, unspecified: Secondary | ICD-10-CM | POA: Diagnosis present

## 2022-05-15 DIAGNOSIS — Z951 Presence of aortocoronary bypass graft: Secondary | ICD-10-CM

## 2022-05-15 DIAGNOSIS — R413 Other amnesia: Secondary | ICD-10-CM | POA: Diagnosis present

## 2022-05-15 DIAGNOSIS — T83518A Infection and inflammatory reaction due to other urinary catheter, initial encounter: Secondary | ICD-10-CM | POA: Diagnosis not present

## 2022-05-15 DIAGNOSIS — N401 Enlarged prostate with lower urinary tract symptoms: Secondary | ICD-10-CM | POA: Diagnosis present

## 2022-05-15 DIAGNOSIS — Z87891 Personal history of nicotine dependence: Secondary | ICD-10-CM

## 2022-05-15 DIAGNOSIS — R29810 Facial weakness: Secondary | ICD-10-CM | POA: Diagnosis present

## 2022-05-15 DIAGNOSIS — R471 Dysarthria and anarthria: Secondary | ICD-10-CM | POA: Diagnosis present

## 2022-05-15 DIAGNOSIS — Z515 Encounter for palliative care: Secondary | ICD-10-CM

## 2022-05-15 DIAGNOSIS — N3001 Acute cystitis with hematuria: Secondary | ICD-10-CM | POA: Diagnosis present

## 2022-05-15 DIAGNOSIS — K219 Gastro-esophageal reflux disease without esophagitis: Secondary | ICD-10-CM | POA: Diagnosis present

## 2022-05-15 DIAGNOSIS — Z8673 Personal history of transient ischemic attack (TIA), and cerebral infarction without residual deficits: Secondary | ICD-10-CM

## 2022-05-15 DIAGNOSIS — R339 Retention of urine, unspecified: Secondary | ICD-10-CM | POA: Diagnosis present

## 2022-05-15 DIAGNOSIS — R7303 Prediabetes: Secondary | ICD-10-CM | POA: Diagnosis present

## 2022-05-15 DIAGNOSIS — R338 Other retention of urine: Secondary | ICD-10-CM | POA: Diagnosis present

## 2022-05-15 DIAGNOSIS — G309 Alzheimer's disease, unspecified: Secondary | ICD-10-CM

## 2022-05-15 DIAGNOSIS — E785 Hyperlipidemia, unspecified: Secondary | ICD-10-CM | POA: Diagnosis present

## 2022-05-15 DIAGNOSIS — Z79899 Other long term (current) drug therapy: Secondary | ICD-10-CM

## 2022-05-15 DIAGNOSIS — Z8249 Family history of ischemic heart disease and other diseases of the circulatory system: Secondary | ICD-10-CM

## 2022-05-15 DIAGNOSIS — F02811 Dementia in other diseases classified elsewhere, unspecified severity, with agitation: Secondary | ICD-10-CM | POA: Diagnosis present

## 2022-05-15 DIAGNOSIS — G959 Disease of spinal cord, unspecified: Secondary | ICD-10-CM

## 2022-05-15 DIAGNOSIS — H409 Unspecified glaucoma: Secondary | ICD-10-CM | POA: Diagnosis present

## 2022-05-15 DIAGNOSIS — N319 Neuromuscular dysfunction of bladder, unspecified: Secondary | ICD-10-CM | POA: Diagnosis present

## 2022-05-15 DIAGNOSIS — R4789 Other speech disturbances: Secondary | ICD-10-CM

## 2022-05-15 DIAGNOSIS — N12 Tubulo-interstitial nephritis, not specified as acute or chronic: Secondary | ICD-10-CM | POA: Diagnosis present

## 2022-05-15 DIAGNOSIS — G9341 Metabolic encephalopathy: Secondary | ICD-10-CM | POA: Diagnosis present

## 2022-05-15 DIAGNOSIS — R531 Weakness: Secondary | ICD-10-CM

## 2022-05-15 DIAGNOSIS — A029 Salmonella infection, unspecified: Secondary | ICD-10-CM | POA: Diagnosis present

## 2022-05-15 DIAGNOSIS — G629 Polyneuropathy, unspecified: Secondary | ICD-10-CM | POA: Diagnosis present

## 2022-05-15 DIAGNOSIS — G9340 Encephalopathy, unspecified: Secondary | ICD-10-CM | POA: Diagnosis present

## 2022-05-15 LAB — POCT URINALYSIS DIP (MANUAL ENTRY)
Bilirubin, UA: NEGATIVE
Glucose, UA: NEGATIVE mg/dL
Ketones, POC UA: NEGATIVE mg/dL
Nitrite, UA: POSITIVE — AB
Protein Ur, POC: 30 mg/dL — AB
Spec Grav, UA: 1.02 (ref 1.010–1.025)
Urobilinogen, UA: 1 E.U./dL
pH, UA: 6 (ref 5.0–8.0)

## 2022-05-15 LAB — PROTIME-INR
INR: 1.1 (ref 0.8–1.2)
Prothrombin Time: 13.9 seconds (ref 11.4–15.2)

## 2022-05-15 LAB — DIFFERENTIAL
Abs Immature Granulocytes: 0.02 10*3/uL (ref 0.00–0.07)
Basophils Absolute: 0 10*3/uL (ref 0.0–0.1)
Basophils Relative: 0 %
Eosinophils Absolute: 0.1 10*3/uL (ref 0.0–0.5)
Eosinophils Relative: 2 %
Immature Granulocytes: 0 %
Lymphocytes Relative: 25 %
Lymphs Abs: 1.6 10*3/uL (ref 0.7–4.0)
Monocytes Absolute: 0.3 10*3/uL (ref 0.1–1.0)
Monocytes Relative: 5 %
Neutro Abs: 4.3 10*3/uL (ref 1.7–7.7)
Neutrophils Relative %: 68 %

## 2022-05-15 LAB — POCT UA - MICROSCOPIC ONLY
Epithelial cells, urine per micros: NONE SEEN
WBC, Ur, HPF, POC: >20 (ref 0–5)

## 2022-05-15 LAB — URINALYSIS, ROUTINE W REFLEX MICROSCOPIC
Bilirubin Urine: NEGATIVE
Glucose, UA: NEGATIVE mg/dL
Ketones, ur: NEGATIVE mg/dL
Nitrite: NEGATIVE
Protein, ur: >=300 mg/dL — AB
Specific Gravity, Urine: >=1.03 (ref 1.005–1.030)
pH: 5.5 (ref 5.0–8.0)

## 2022-05-15 LAB — COMPREHENSIVE METABOLIC PANEL
ALT: 11 U/L (ref 0–44)
AST: 17 U/L (ref 15–41)
Albumin: 3.7 g/dL (ref 3.5–5.0)
Alkaline Phosphatase: 99 U/L (ref 38–126)
Anion gap: 10 (ref 5–15)
BUN: 15 mg/dL (ref 8–23)
CO2: 26 mmol/L (ref 22–32)
Calcium: 8.9 mg/dL (ref 8.9–10.3)
Chloride: 100 mmol/L (ref 98–111)
Creatinine, Ser: 0.98 mg/dL (ref 0.61–1.24)
GFR, Estimated: >60 mL/min (ref >60)
Glucose, Bld: 134 mg/dL — ABNORMAL HIGH (ref 70–99)
Potassium: 3.2 mmol/L — ABNORMAL LOW (ref 3.5–5.1)
Sodium: 136 mmol/L (ref 135–145)
Total Bilirubin: 2 mg/dL — ABNORMAL HIGH (ref 0.3–1.2)
Total Protein: 7.3 g/dL (ref 6.5–8.1)

## 2022-05-15 LAB — CBC
HCT: 36.8 % — ABNORMAL LOW (ref 39.0–52.0)
Hemoglobin: 12.6 g/dL — ABNORMAL LOW (ref 13.0–17.0)
MCH: 30.6 pg (ref 26.0–34.0)
MCHC: 34.2 g/dL (ref 30.0–36.0)
MCV: 89.3 fL (ref 80.0–100.0)
Platelets: 160 10*3/uL (ref 150–400)
RBC: 4.12 MIL/uL — ABNORMAL LOW (ref 4.22–5.81)
RDW: 14.5 % (ref 11.5–15.5)
WBC: 6.4 10*3/uL (ref 4.0–10.5)
nRBC: 0 % (ref 0.0–0.2)

## 2022-05-15 LAB — RAPID URINE DRUG SCREEN, HOSP PERFORMED
Amphetamines: NOT DETECTED
Barbiturates: NOT DETECTED
Benzodiazepines: NOT DETECTED
Cocaine: NOT DETECTED
Opiates: NOT DETECTED
Tetrahydrocannabinol: NOT DETECTED

## 2022-05-15 LAB — URINALYSIS, MICROSCOPIC (REFLEX): WBC, UA: >50 WBC/hpf (ref 0–5)

## 2022-05-15 LAB — APTT: aPTT: 33 seconds (ref 24–36)

## 2022-05-15 MED ORDER — SODIUM CHLORIDE 0.9 % IV SOLN
1.0000 g | Freq: Once | INTRAVENOUS | Status: AC
  Administered 2022-05-15: 1 g via INTRAVENOUS
  Filled 2022-05-15: qty 10

## 2022-05-15 MED ORDER — DIVALPROEX SODIUM 250 MG PO DR TAB: 250.0000 mg | DELAYED_RELEASE_TABLET | Freq: Two times a day (BID) | ORAL | 1 refills | Status: DC

## 2022-05-15 NOTE — Procedures (Signed)
DBS Programming was performed.    Manufacturer of DBS device: [employer]onic  Total time spent programming was 8 minutes.  Device was confirmed to be on.  Soft start was confirmed to be on.  Impedences were checked and were within normal limits.  Battery was checked and was determined to be functioning normally bilaterally and not near end of life.  Final settings were as follows:   Active Contacts Amplitude (V) PW (ms) Frequency (hz) Battery  Left Brain       08/07/19 1-0+ 3.3 90 160 2.97  06/09/20 1-0+ 3.3 90 160 2.93  07/26/20 1-0+ 4.0 90 150 2.92  01/26/21 1-0+ 4.1 90 160 2.89  06/30/21 1-0+ 4.2 90 160 2.83  10/05/21 1-0+ 4.4 90 160 2.78  02/07/22 1-0+ 4.1 90 160 2.58  05/15/22 1-0+ 4.4 90 160 2.95         Right Brain       08/07/19 1-0+ 2.7 90 150 2.98  06/09/20 1-0+ 2.7 90 150 2.95  07/26/20 1-0+ 2.8 90 145 2.94  01/26/21 1-0+ 3.4 90 145 2.90  06/30/21 1-0+ 3.7 (3.3-4.3) 90 145 2.87  10/05/21 1-0+ 4.0 90 145 2.83  02/07/22 1-0+ 4.3 90 155 2.71  05/15/22 1-0+ 4.4 90 155 2.95          several other settings tried previously on the L, 10/05/21, including: 1-C+ - lots of tremor; 1-2+ 4.2 90/160 - continued tremor;

## 2022-05-15 NOTE — ED Notes (Signed)
Verified no Blood cultures needed prior to starting abx, MD confirmed

## 2022-05-15 NOTE — ED Triage Notes (Signed)
Patient presents to ED via POV from home with family. Patient here with right sided weakness and right sided facial droop. Family reports patient drags his right leg when walking. Last known well was a week ago per family. Frequent fall reported as well.

## 2022-05-15 NOTE — ED Provider Notes (Signed)
[facility] [place] Provider Note   CSN: [id number] Arrival date & time: 05/15/22  1635     History  Chief Complaint  Patient presents with   Facial Droop    Brendan [name] is a 82 y.o. male.  The history is provided by the patient, medical records and a relative. No language interpreter was used.  Neurologic Problem This is a new problem. The current episode started more than 1 week ago. The problem occurs constantly. The problem has not changed since onset.Associated symptoms include headaches (intermittnet per family). Pertinent negatives include no chest pain, no abdominal pain and no shortness of breath. Nothing aggravates the symptoms. Nothing relieves the symptoms. He has tried nothing for the symptoms. The treatment provided no relief.       Home Medications Prior to Admission medications   Medication Sig Start Date End Date Taking? Authorizing Provider  albuterol (VENTOLIN HFA) 108 (90 Base) MCG/ACT inhaler Inhale 2 puffs into the lungs every 4 (four) hours as needed for wheezing or shortness of breath. 12/23/21   Precious Gilding, DO  aspirin EC 81 MG tablet Take 1 tablet (81 mg total) by mouth daily. 02/23/22   Dawley, Troy C, DO  atorvastatin (LIPITOR) 20 MG tablet Take 1 tablet (20 mg total) by mouth daily. 06/13/21 02/16/22  Leonie Man, MD  cephALEXin (KEFLEX) 500 MG capsule Take 1 capsule (500 mg total) by mouth 4 (four) times daily. 04/16/22   Lajean Saver, MD  cephALEXin (KEFLEX) 500 MG capsule Take 1 capsule (500 mg total) by mouth 4 (four) times daily. 04/16/22   Lajean Saver, MD  Cyanocobalamin (VITAMIN B 12) 500 MCG TABS Take 1,000 mcg by mouth daily. Patient taking differently: Take 5,000 mcg by mouth daily. 08/04/20   Tat, Eustace Quail, DO  divalproex (DEPAKOTE) 250 MG DR tablet Take 1 tablet (250 mg total) by mouth 2 (two) times daily. 05/15/22   Tat, Eustace Quail, DO  fluticasone (FLONASE) 50 MCG/ACT nasal spray Place 1 spray  into both nostrils daily as needed for allergies. 04/21/21   Autry-Lott, Naaman Plummer, DO  Hyprom-Naphaz-Polysorb-Zn Sulf (CLEAR EYES COMPLETE OP) Place 1 drop into both eyes daily as needed (dry eyes).    [provider]  loratadine (ALLERGY RELIEF) 10 MG tablet Take 1 tablet (10 mg total) by mouth daily. 05/02/22   Precious Gilding, DO  Melatonin 3 MG TABS Take 1 tablet (3 mg total) by mouth at bedtime. Patient taking differently: Take 5 mg by mouth at bedtime. 06/08/17   Angiulli, Lavon Paganini, PA-C  methocarbamol (ROBAXIN) 750 MG tablet TAKE 1 TABLET BY MOUTH TWICE DAILY AS NEEDED FOR MUSCLE SPASMS 03/13/22   Precious Gilding, DO  nitroGLYCERIN (NITROSTAT) 0.4 MG SL tablet Place 1 tablet (0.4 mg total) under the tongue every 5 (five) minutes as needed for chest pain. call 911 if chest pain not better 03/05/18   Lovenia Kim, MD  nystatin cream (MYCOSTATIN) APPLY TOPICALLY TO RASH FOUR TIMES DAILYFOR TWO WEEKS Patient taking differently: Apply 1 Application topically at bedtime as needed (Rash). 06/10/21   Autry-Lott, Naaman Plummer, DO  omeprazole (PRILOSEC) 20 MG capsule TAKE 1 CAPSULE BY MOUTH DAILY AS NEEDED FOR ACID REFLUX 02/02/22   Eppie Gibson, MD  traMADol (ULTRAM) 50 MG tablet Take 1 tablet (50 mg total) by mouth every 6 (six) hours as needed. 02/16/22 02/16/23  Dawley, Troy C, DO  traMADol (ULTRAM) 50 MG tablet Take 1 tablet (50 mg total) by mouth every  6 (six) hours as needed for moderate pain. 04/04/22   Erick Alley, DO  traZODone (DESYREL) 100 MG tablet TAKE 1 TABLET BY MOUTH EACH NIGHT AT BEDTIME 03/24/22   Erick Alley, DO  Zoster Vaccine Adjuvanted [facility]) injection Inject 0.5 ml IM and Repeat in 2 months 01/04/22   Carney Living, MD      Allergies    Lisinopril    Review of Systems   Review of Systems  Constitutional:  Positive for fatigue. Negative for chills and fever.  HENT:  Negative for congestion.   Eyes:  Negative for visual disturbance.  Respiratory:  Negative for cough,  chest tightness, shortness of breath and wheezing.   Cardiovascular:  Negative for chest pain and palpitations.  Gastrointestinal:  Negative for abdominal pain, constipation, diarrhea, nausea and vomiting.  Genitourinary:  Positive for decreased urine volume (dark and foul appearing). Negative for dysuria and flank pain.  Musculoskeletal:  Positive for gait problem (falls per family). Negative for back pain, neck pain and neck stiffness.  Skin:  Negative for rash and wound.  Neurological:  Positive for facial asymmetry, weakness, numbness and headaches (intermittnet per family). Negative for dizziness and light-headedness.  Psychiatric/Behavioral:  Negative for agitation and confusion.   All other systems reviewed and are negative.   Physical Exam Updated Vital Signs BP (!) 171/78 (BP Location: Left Arm)   Pulse 68   Temp 98 F (36.7 C) (Oral)   Resp 20   Ht 5\' 8"  (1.727 m)   Wt 71.2 kg   SpO2 98%   BMI 23.87 kg/m  Physical Exam Vitals and nursing note reviewed.  Constitutional:      General: He is not in acute distress.    Appearance: He is well-developed. He is not ill-appearing, toxic-appearing or diaphoretic.  HENT:     Head: Atraumatic.     Mouth/Throat:     Mouth: Mucous membranes are moist.     Pharynx: No oropharyngeal exudate or posterior oropharyngeal erythema.  Eyes:     Extraocular Movements: Extraocular movements intact.     Conjunctiva/sclera: Conjunctivae normal.     Pupils: Pupils are equal, round, and reactive to light.  Cardiovascular:     Rate and Rhythm: Normal rate and regular rhythm.     Heart sounds: No murmur heard. Pulmonary:     Effort: Pulmonary effort is normal. No respiratory distress.     Breath sounds: Normal breath sounds. No wheezing or rhonchi.  Chest:     Chest wall: No tenderness.  Abdominal:     General: Abdomen is flat.     Palpations: Abdomen is soft.     Tenderness: There is no abdominal tenderness. There is no guarding or  rebound.  Musculoskeletal:        General: No swelling or tenderness.     Cervical back: Neck supple. No tenderness.  Skin:    General: Skin is warm and dry.     Capillary Refill: Capillary refill takes less than 2 seconds.     Findings: No erythema.  Neurological:     Mental Status: He is alert.     Cranial Nerves: Facial asymmetry present. No dysarthria.     Sensory: Sensory deficit present.     Motor: Weakness and tremor present. No seizure activity.     Coordination: Finger-Nose-Finger Test abnormal.     Comments: Patient has numbness in right face, right arm, and right leg compared to left.  Has subtle facial droop on right side and has  some grip strength decreased on right compared to left.  Decrease in leg raise strength on the right compared to left.  Difficulty with finger-nose-finger testing bilaterally with his tremor.  Slow answering questions and has some mild aphasia the family reports his new over the last few weeks.  Psychiatric:        Mood and Affect: Mood normal.     ED Results / Procedures / Treatments   Labs (all labs ordered are listed, but only abnormal results are displayed) Labs Reviewed  CBC - Abnormal; Notable for the following components:      Result Value   RBC 4.12 (*)    Hemoglobin 12.6 (*)    HCT 36.8 (*)    All other components within normal limits  COMPREHENSIVE METABOLIC PANEL - Abnormal; Notable for the following components:   Potassium 3.2 (*)    Glucose, Bld 134 (*)    Total Bilirubin 2.0 (*)    All other components within normal limits  URINALYSIS, ROUTINE W REFLEX MICROSCOPIC - Abnormal; Notable for the following components:   APPearance CLOUDY (*)    Hgb urine dipstick MODERATE (*)    Protein, ur >=300 (*)    Leukocytes,Ua MODERATE (*)    All other components within normal limits  URINALYSIS, MICROSCOPIC (REFLEX) - Abnormal; Notable for the following components:   Bacteria, UA MANY (*)    All other components within normal limits   PROTIME-INR  APTT  DIFFERENTIAL  RAPID URINE DRUG SCREEN, HOSP PERFORMED  ETHANOL    EKG EKG Interpretation  Date/Time:  Monday May 15 2022 17:16:09 EST Ventricular Rate:  86 PR Interval:  154 QRS Duration: 113 QT Interval:  386 QTC Calculation: 462 R Axis:   -83 Text Interpretation: Sinus rhythm Atrial premature complexes Left anterior fascicular block Anterior infarct, old when compared to prior, more artifact. No STEMI Confirmed by Antony Blackbird [phone] on 05/15/2022 5:43:57 PM  Radiology CT Head Wo Contrast  Result Date: 05/15/2022 CLINICAL DATA:  Neuro deficit, acute, stroke suspected EXAM: CT HEAD WITHOUT CONTRAST TECHNIQUE: Contiguous axial images were obtained from the base of the skull through the vertex without intravenous contrast. RADIATION DOSE REDUCTION: This exam was performed according to the departmental dose-optimization program which includes automated exposure control, adjustment of the mA and/or kV according to patient size and/or use of iterative reconstruction technique. COMPARISON:  Head CT 05/15/2017, brain MRI 09/07/2020 FINDINGS: Brain: No acute intracranial hemorrhage. No evidence of acute ischemia. There is been progressive atrophy and ventricular enlargement from 2019,, but relatively stable brain volume from 2022. Bilateral deep brain stimulators in place. Minimal encephalomalacia along the left deep brain stimulator at site of prior hemorrhage. Progressive periventricular and deep white matter hypodensity typical of chronic small vessel ischemia. No subdural or extra-axial collection. Vascular: Atherosclerosis of skullbase vasculature without hyperdense vessel or abnormal calcification. Skull: No fracture or focal lesion. Sinuses/Orbits: No acute findings. Other: None. IMPRESSION: 1. No acute intracranial abnormality. 2. Bilateral deep brain stimulators in place. Minimal encephalomalacia along the left deep brain stimulator at site at site of prior  hemorrhage. 3. Generalized atrophy with chronic small vessel ischemia. Degree of atrophy is similar to 2022 MRI, but progressed from 2019 CT. Electronically Signed   By: Keith Rake M.D.   On: 05/15/2022 18:41   US Venous Img Lower Unilateral Right  Result Date: 05/15/2022 CLINICAL DATA:  Right leg pain and swelling. EXAM: RIGHT LOWER EXTREMITY VENOUS DOPPLER ULTRASOUND TECHNIQUE: Gray-scale sonography with compression, as well as color  and duplex ultrasound, were performed to evaluate the deep venous system(s) from the level of the common femoral vein through the popliteal and proximal calf veins. COMPARISON:  None Available. FINDINGS: VENOUS Normal compressibility of the common femoral, superficial femoral, and popliteal veins, as well as the visualized calf veins. Peroneal vein is not visualized. Visualized portions of profunda femoral vein and great saphenous vein unremarkable. No filling defects to suggest DVT on grayscale or color Doppler imaging. Doppler waveforms show normal direction of venous flow, normal respiratory plasticity and response to augmentation. Limited views of the contralateral common femoral vein are unremarkable. OTHER None. Limitations: Soft tissue edema noted in the ankle area. IMPRESSION: Negative. Electronically Signed   By: Sherian Rein M.D.   On: 05/15/2022 18:20    Procedures Procedures    Medications Ordered in ED Medications  cefTRIAXone (ROCEPHIN) 1 g in sodium chloride 0.9 % 100 mL IVPB (1 g Intravenous New Bag/Given 05/15/22 2202)    ED Course/ Medical Decision Making/ A&P                             Medical Decision Making Risk Decision regarding hospitalization.    [name] IONESCU is a 82 y.o. male with a past medical history significant for CAD status post CABG, hypertension, hyperlipidemia, essential tremor status post bilateral deep brain stimulator, previous intracerebral hemorrhage, stroke, peripheral neuropathies, and memory loss who  presents from neurologist office for CT scan and further evaluation of worsened speech difficulty, right-sided numbness, right-sided weakness, falls, fatigue, and abnormal appearing urine at home.  According to patient and family, for the last 3 weeks patient has had worsened speech difficulties feeling like he cannot get what he wants it out in his speech is much lower.  He is also had mild headaches on and off but not currently having any.  He denies any vision changes but is complaining of numbness in his entire right body in his right face, right arm, right leg and weakness in his right arm and right leg.  Family also noticed waxing and waning facial droop on the right side.  Otherwise he reports his urine has been stronger and darker and he does in and out catheterization at home.  They are concerned about recurrent UTI.  He denies fevers, chills, congestion, cough, nausea, vomiting, constipation, or diarrhea.  He has had multiple falls in the last week with the right-sided symptoms and they report he is dragging his right foot.  Family reports that his right legs been swollen since this fall several weeks ago and they had an x-ray that did not show fracture.  On exam, lungs clear and chest nontender.  Abdomen nontender.  Patient has numbness in right face, right arm, and right leg compared to left.  He has decreased leg raise on the right and decreased strength in right grip compared to left.  He does have a subtle right lower facial droop with large smile however his extraocular movements are intact and pupils are symmetric and reactive.  Patient had workup starting in triage including CT scan, DVT ultrasound, and labs.  He had right leg pain and swelling after a fall several weeks ago and they report x-rays were reassuring.  Ultrasound was obtained and did not show DVT.  Patient had a head CT that showed some encephalomalacia where he has had the left intracranial hemorrhage in the past but otherwise  no acute abnormality seen.  His labs  overall appeared similar to prior however urinalysis still needs to be collected.  Clinical aspect either has recrudescence of previous stroke versus acute stroke causing his falls, speech changes, and the right-sided symptoms.  If urinalysis shows of infection, will admit for UTI leading to multiple falls and mental status changes however if urinalysis is reassuring, will discuss with neurology and anticipate he will need ED to ED transfer for MRI.  Of note, wife has a DBS stimulator control at home and will need to go home to get it for his MRI.  9:19 PM Urinalysis does show evidence of many bacteria and leukocytes.  I suspect patient does have UTI given his symptoms.  I spoke to neurology who agrees that patient is a appropriate for admission to hospital for suspected recrudescence causing the right-sided symptoms in the setting of UTI leading to falls and dragging right foot and can get MRI to rule out acute stroke as well.  Will order antibiotics and call for admission.  Will hold on MRI order until hospitalist team sees him to confirm he has the deep brain stimulator remote near him.        Final Clinical Impression(s) / ED Diagnoses Final diagnoses:  Weakness of right side of body  Numbness on right side  Slow rate of speech  Acute cystitis with hematuria     Clinical Impression: 1. Weakness of right side of body   2. Numbness on right side   3. Slow rate of speech   4. Acute cystitis with hematuria     Disposition: Admit  This note was prepared with assistance of Dragon voice recognition software. Occasional wrong-word or sound-a-like substitutions may have occurred due to the inherent limitations of voice recognition software.     Lenetta Piche, Canary Brim, MD 05/15/22 2207

## 2022-05-15 NOTE — ED Provider Triage Note (Signed)
Emergency Medicine Provider Triage Evaluation Note  Brendan [name] , a 82 y.o. male  was evaluated in triage.  Pt complains of right sided weakness. Last known normal was over a week ago. Has been seen by his PCP who was concerned for his urine. Complaining of worsening right sided weakness for the past 4-5 weeks that has been gradually worsening.Wife is unknown if the patient is as his baseline. Reports he had a fall hurting his right lower legs a few weeks ago but had normal imaging last week aat ortho.   Review of Systems  Positive:  Negative:   Physical Exam  BP (!) 147/83   Pulse 98   Temp 98 F (36.7 C) (Oral)   Resp 17   Ht 5\' 8"  (1.727 m)   Wt 71.2 kg   SpO2 99%   BMI 23.87 kg/m  Gen:   Awake, no distress   Resp:  Normal effort  MSK:   Moves extremities without difficulty  Other:  Patient smiling in no acute distress. Hard to follow commands. I do not appreciate any facial droop. He has equal strength in upper and lower extremities. He does have some swelling to his right lower leg. Palpable pulse that is marked on the foot. Compartments are soft.   Medical Decision Making  Medically screening exam initiated at 5:06 PM.  Appropriate orders placed.  Brendan [name] was informed that the remainder of the evaluation will be completed by another provider, this initial triage assessment does not replace that evaluation, and the importance of remaining in the ED until their evaluation is complete.  Out of window for stroke. Labs ordered. Daughter would like test for DVT.    Sherrell Puller, PA-C 05/15/22 1710

## 2022-05-16 ENCOUNTER — Telehealth: Payer: Self-pay | Admitting: Family Medicine

## 2022-05-16 ENCOUNTER — Telehealth: Payer: Self-pay | Admitting: Neurology

## 2022-05-16 DIAGNOSIS — F02811 Dementia in other diseases classified elsewhere, unspecified severity, with agitation: Secondary | ICD-10-CM | POA: Insufficient documentation

## 2022-05-16 DIAGNOSIS — N401 Enlarged prostate with lower urinary tract symptoms: Secondary | ICD-10-CM | POA: Diagnosis not present

## 2022-05-16 DIAGNOSIS — F0280 Dementia in other diseases classified elsewhere without behavioral disturbance: Secondary | ICD-10-CM | POA: Diagnosis not present

## 2022-05-16 DIAGNOSIS — Z87891 Personal history of nicotine dependence: Secondary | ICD-10-CM | POA: Diagnosis not present

## 2022-05-16 DIAGNOSIS — Z79899 Other long term (current) drug therapy: Secondary | ICD-10-CM | POA: Diagnosis not present

## 2022-05-16 DIAGNOSIS — G309 Alzheimer's disease, unspecified: Secondary | ICD-10-CM | POA: Diagnosis not present

## 2022-05-16 DIAGNOSIS — F0390 Unspecified dementia without behavioral disturbance: Secondary | ICD-10-CM | POA: Insufficient documentation

## 2022-05-16 DIAGNOSIS — Y732 Prosthetic and other implants, materials and accessory gastroenterology and urology devices associated with adverse incidents: Secondary | ICD-10-CM | POA: Diagnosis not present

## 2022-05-16 DIAGNOSIS — E785 Hyperlipidemia, unspecified: Secondary | ICD-10-CM | POA: Diagnosis not present

## 2022-05-16 DIAGNOSIS — R29810 Facial weakness: Secondary | ICD-10-CM | POA: Diagnosis not present

## 2022-05-16 DIAGNOSIS — N3001 Acute cystitis with hematuria: Secondary | ICD-10-CM | POA: Diagnosis not present

## 2022-05-16 DIAGNOSIS — G9340 Encephalopathy, unspecified: Secondary | ICD-10-CM | POA: Diagnosis not present

## 2022-05-16 DIAGNOSIS — E876 Hypokalemia: Secondary | ICD-10-CM | POA: Diagnosis present

## 2022-05-16 DIAGNOSIS — T83518A Infection and inflammatory reaction due to other urinary catheter, initial encounter: Secondary | ICD-10-CM | POA: Diagnosis not present

## 2022-05-16 DIAGNOSIS — Z66 Do not resuscitate: Secondary | ICD-10-CM | POA: Diagnosis not present

## 2022-05-16 DIAGNOSIS — I10 Essential (primary) hypertension: Secondary | ICD-10-CM | POA: Diagnosis not present

## 2022-05-16 DIAGNOSIS — Z8249 Family history of ischemic heart disease and other diseases of the circulatory system: Secondary | ICD-10-CM | POA: Diagnosis not present

## 2022-05-16 DIAGNOSIS — R4701 Aphasia: Secondary | ICD-10-CM | POA: Diagnosis not present

## 2022-05-16 DIAGNOSIS — I2510 Atherosclerotic heart disease of native coronary artery without angina pectoris: Secondary | ICD-10-CM | POA: Diagnosis not present

## 2022-05-16 DIAGNOSIS — G8191 Hemiplegia, unspecified affecting right dominant side: Secondary | ICD-10-CM | POA: Diagnosis not present

## 2022-05-16 DIAGNOSIS — N390 Urinary tract infection, site not specified: Secondary | ICD-10-CM | POA: Diagnosis present

## 2022-05-16 DIAGNOSIS — G9389 Other specified disorders of brain: Secondary | ICD-10-CM | POA: Diagnosis not present

## 2022-05-16 DIAGNOSIS — R531 Weakness: Secondary | ICD-10-CM | POA: Diagnosis not present

## 2022-05-16 DIAGNOSIS — R338 Other retention of urine: Secondary | ICD-10-CM | POA: Diagnosis not present

## 2022-05-16 DIAGNOSIS — A029 Salmonella infection, unspecified: Secondary | ICD-10-CM | POA: Diagnosis not present

## 2022-05-16 DIAGNOSIS — N12 Tubulo-interstitial nephritis, not specified as acute or chronic: Secondary | ICD-10-CM | POA: Diagnosis not present

## 2022-05-16 DIAGNOSIS — N319 Neuromuscular dysfunction of bladder, unspecified: Secondary | ICD-10-CM | POA: Diagnosis not present

## 2022-05-16 DIAGNOSIS — R296 Repeated falls: Secondary | ICD-10-CM | POA: Diagnosis not present

## 2022-05-16 DIAGNOSIS — Z515 Encounter for palliative care: Secondary | ICD-10-CM | POA: Diagnosis not present

## 2022-05-16 DIAGNOSIS — G9341 Metabolic encephalopathy: Secondary | ICD-10-CM | POA: Diagnosis not present

## 2022-05-16 MED ORDER — ACETAMINOPHEN 650 MG RE SUPP: 650.0000 mg | Freq: Four times a day (QID) | RECTAL | Status: DC | PRN

## 2022-05-16 MED ORDER — ALBUTEROL SULFATE HFA 108 (90 BASE) MCG/ACT IN AERS: 2.0000 | INHALATION_SPRAY | RESPIRATORY_TRACT | Status: DC | PRN

## 2022-05-16 MED ORDER — ENOXAPARIN SODIUM 40 MG/0.4ML IJ SOSY
40.0000 mg | PREFILLED_SYRINGE | INTRAMUSCULAR | Status: DC
  Administered 2022-05-16 – 2022-05-17 (×2): 40 mg via SUBCUTANEOUS
  Filled 2022-05-16 (×3): qty 0.4

## 2022-05-16 MED ORDER — BISACODYL 5 MG PO TBEC: 5.0000 mg | DELAYED_RELEASE_TABLET | Freq: Every day | ORAL | Status: DC | PRN

## 2022-05-16 MED ORDER — DOCUSATE SODIUM 100 MG PO CAPS
100.0000 mg | ORAL_CAPSULE | Freq: Two times a day (BID) | ORAL | Status: DC
  Administered 2022-05-17 – 2022-05-19 (×6): 100 mg via ORAL
  Filled 2022-05-16 (×7): qty 1

## 2022-05-16 MED ORDER — TRAZODONE HCL 100 MG PO TABS
100.0000 mg | ORAL_TABLET | Freq: Every day | ORAL | Status: DC
  Administered 2022-05-16 – 2022-05-19 (×4): 100 mg via ORAL
  Filled 2022-05-16 (×4): qty 1

## 2022-05-16 MED ORDER — ONDANSETRON HCL 4 MG PO TABS
4.0000 mg | ORAL_TABLET | Freq: Four times a day (QID) | ORAL | Status: DC | PRN
  Filled 2022-05-16: qty 1

## 2022-05-16 MED ORDER — HALOPERIDOL LACTATE 5 MG/ML IJ SOLN: 2.0000 mg | Freq: Four times a day (QID) | INTRAMUSCULAR | Status: DC | PRN

## 2022-05-16 MED ORDER — HYDRALAZINE HCL 20 MG/ML IJ SOLN: 5.0000 mg | INTRAMUSCULAR | Status: DC | PRN

## 2022-05-16 MED ORDER — SODIUM CHLORIDE 0.9% FLUSH
3.0000 mL | Freq: Two times a day (BID) | INTRAVENOUS | Status: DC
  Administered 2022-05-16 – 2022-05-18 (×5): 3 mL via INTRAVENOUS
  Filled 2022-05-16: qty 3

## 2022-05-16 MED ORDER — MORPHINE SULFATE (PF) 2 MG/ML IV SOLN: 2.0000 mg | INTRAVENOUS | Status: DC | PRN

## 2022-05-16 MED ORDER — POTASSIUM CHLORIDE CRYS ER 20 MEQ PO TBCR
40.0000 meq | EXTENDED_RELEASE_TABLET | Freq: Once | ORAL | Status: AC
  Administered 2022-05-16: 40 meq via ORAL
  Filled 2022-05-16: qty 2

## 2022-05-16 MED ORDER — ACETAMINOPHEN 325 MG PO TABS: 650.0000 mg | ORAL_TABLET | Freq: Four times a day (QID) | ORAL | Status: DC | PRN

## 2022-05-16 MED ORDER — ONDANSETRON HCL 4 MG/2ML IJ SOLN: 4.0000 mg | Freq: Four times a day (QID) | INTRAMUSCULAR | Status: DC | PRN

## 2022-05-16 MED ORDER — SODIUM CHLORIDE 0.9 % IV SOLN
1.0000 g | INTRAVENOUS | Status: DC
  Administered 2022-05-16 – 2022-05-18 (×3): 1 g via INTRAVENOUS
  Filled 2022-05-16 (×3): qty 10

## 2022-05-16 MED ORDER — OXYCODONE HCL 5 MG PO TABS: 5.0000 mg | ORAL_TABLET | ORAL | Status: DC | PRN

## 2022-05-16 MED ORDER — POLYETHYLENE GLYCOL 3350 17 G PO PACK: 17.0000 g | PACK | Freq: Every day | ORAL | Status: DC | PRN

## 2022-05-16 MED ORDER — TAMSULOSIN HCL 0.4 MG PO CAPS
0.4000 mg | ORAL_CAPSULE | Freq: Every day | ORAL | Status: DC
  Administered 2022-05-16 – 2022-05-20 (×5): 0.4 mg via ORAL
  Filled 2022-05-16 (×5): qty 1

## 2022-05-16 MED ORDER — ALBUTEROL SULFATE (2.5 MG/3ML) 0.083% IN NEBU: 2.5000 mg | INHALATION_SOLUTION | RESPIRATORY_TRACT | Status: DC | PRN

## 2022-05-16 NOTE — Assessment & Plan Note (Addendum)
Had suprapubic tenderness and bladder scan showed 279, I/O cathed 300cc.  - Consider placing Foley for patient comfort - In and out cath Q6H  - cont Flomax 0.4 mg daily

## 2022-05-16 NOTE — H&P (Addendum)
Hospital Admission History and Physical Service Pager: [phone]  Patient name: Brendan [name] Medical record number: [id number] Date of Birth: 24-May-1940 Age: 82 y.o. Gender: male  Primary Care Provider: Arlyce Dice, MD Consultants: Neurology  Code Status: DNR, confirmed with family at bedside   Preferred Emergency Contact:  Contact Information     Name Relation Home Work [place] [phone]  [phone]   Moon,[name] Daughter [phone]  [phone]       Chief Complaint: Right Sided Weakness, UTI   Assessment and Plan: Brendan [name] is a 82 y.o. male presenting with progressive right sided weakness and confusion. Differential for this patient's presentation of this includes acute stroke, worsening dementia, infection, recrudescence of prior hemorrhagic stroke.  Acute stroke still highly likely with patient's history of tobacco abuse and prior CAD, CT head negative but could consider additional investigation with MRI brain.  Most likely this is due to progressively worsening dementia, it appears patient's presentation has been present and worsening for many years.  Infection still in the differential although less likely as patient is not exhibiting fever, leukocytosis, or changes in urinary status (which would be the most likely source with chronic in and out caths for many years).   * Encephalopathy Presenting with progressive aphasia, right-sided hemiparesis and falls.  At baseline has dementia.  CT head showing prior defect from hemorrhage and no acute abnormalities.  Neurology consulted at prior ED and requesting MRI; complicated by deep brain stimulator.  - Admit to FMTS, attending Dr. Andria Frames  - MedTele, Vital signs per floor - Neurology consulted, appreciate recommendations - MRI pending - Regular diet  - PT/OT to treat - VTE prophylaxis: Lovenox - AM CBC/BMP  - Fall and Delirium precautions - Cont ASA, Lipitor 20 mg  - Haldol 1-2 mg PRN for  agitation  Recurrent UTI UA in the ED suspicious for infection with leukocytes and bacteria.  Patient also high risk with chronic catheter use at home. Unclear if this is an acute infection or a chronic colonization at this time.  - Continue Rocephin (1/29 - 2/4) to complete 7 days of treatment, can switch to p.o. equivalent as tolerated - Follow urine culture  Urinary retention Chronically uses in and out cath at home for the last 2-3 years  - In and out cath Q6H  - start Flomax 0.4 mg daily (1/30)  Hypokalemia K 3.2 1/29 -Recheck BMP -Order 40 mEq K-Lor    FEN/GI: Heart healthy VTE Prophylaxis: Lovenox  Disposition: MedTele, Attending Dr. Andria Frames   History of Present Illness:  Brendan [name] is a 82 y.o. male presenting from neurologist office for CT scan and further evaluation of worsened speech difficulty, right-sided numbness, right-sided weakness. Family at bedside providing history. At baseline, patient appears to have significant deficits and unable to care for himself for many years.   Per family in the last 3-week patient's has had a decline in speech trouble with word finding and expression and increased aggression.  He has had multiple falls in the last week due to dragging his right foot.  X-ray of the right foot few weeks ago negative for fracture. This appears to be progressing problem since 2019.   History of in and out caths at home for the last 2-3 years. Noticing patient has become increasingly agitated with caths.  Family reports most recent UTI in early January, he completed full antibiotic treatment duration then.   In the ED, VS were stable but  was noted to have right LE edema. Obtained a DVT ultrasound of his right leg to rule out clot which was negative.  Head CT showed encephalomalacia and area of prior left intracranial hemorrhage but no other acute abnormality seen.  Patient to be transferred to [facility] for MRI and further workup.  UA in the ED showing  concern for UTI with many bacteria and leukocytes.  Neurology consulted in the ED and recommended admission for suspected recrudescence causing right-sided symptoms in the setting of UTI leading to falls and requesting MRI to rule out acute stroke.  Patient started on Rocephin (1/29).  Review Of Systems: Per HPI with the following additions: as above   Pertinent Past Medical History: CAD s/p CABG, hypertension, hyperlipidemia, ET s/p bilateral deep brain stimulator, history of intracerebral hemorrhage, stroke, peripheral neuropathy, memory loss Remainder reviewed in history tab.   Pertinent Past Surgical History: Cervical spine fusion 2022 Left knee surgery LHC 2004, 2013 DB stimulator 2019 Spinal cord stimulator battery exchange 2023 Remainder reviewed in history tab.   Pertinent Social History: Tobacco use: Former, 3 packs/day for 20 years, quit 1970 Alcohol use: Denies Other Substance use: Denies Lives with wife and one of his daughters at [employer]  Pertinent Family History: Mother: Heart disease Brother: Heart disease Remainder reviewed in history tab.   Important Outpatient Medications: ASA, Lipitor 20 mg Depakote 250 mg twice daily Robaxin-750 milligrams twice daily as needed Prilosec 20 mg daily Tramadol 50 mg every 6 hours as needed Trazodone 100 mg daily at bedtime Remainder reviewed in medication history.   Objective: BP (!) 150/80 (BP Location: Right Arm)   Pulse (!) 132   Temp 98.3 F (36.8 C) (Oral)   Resp 18   Ht 5\' 8"  (1.727 m)   Wt 71.2 kg   SpO2 97%   BMI 23.87 kg/m  Exam: Chronically ill-appearing, no acute distress Cardio: Regular rate, regular rhythm, no murmurs on exam. Pulm: Clear, no wheezing, no crackles. No increased work of breathing Abdominal: bowel sounds present, soft, yelps with suprapubic palpation, non-distended Extremities: no peripheral edema,  Neuro: alert, disoriented, non-verbal, no facial asymmetry, strength slightly  diminished on the right UE, refuses to move the right LE, pupils equal and reactive to light.    Labs:  CBC BMET  Recent Labs  Lab 05/15/22 1705  WBC 6.4  HGB 12.6*  HCT 36.8*  PLT 160   Recent Labs  Lab 05/15/22 1705  NA 136  K 3.2*  CL 100  CO2 26  BUN 15  CREATININE 0.98  GLUCOSE 134*  CALCIUM 8.9    Pertinent additional labs UDS negative UA with moderate leukocytes and >300 protein, INR 1.1 PT 13.9.   EKG: Normal sinus rhythm, no axis without deviation, no ST elevations  Imaging Studies Performed:  Right lower extremity venous Doppler ultrasound: Negative for acute DVT  CT head without contrast: IMPRESSION: 1. No acute intracranial abnormality. 2. Bilateral deep brain stimulators in place. Minimal encephalomalacia along the left deep brain stimulator at site at site of prior hemorrhage. 3. Generalized atrophy with chronic small vessel ischemia. Degree of atrophy is similar to 2022 MRI, but progressed from 2019 CT.  Darci Current, DO 05/16/2022, 2:37 PM PGY-1, [place] Intern pager: [phone], text pages welcome Secure chat group [place]    I have evaluated this patient along with Dr. Sabra Heck and reviewed the above note, making necessary revisions.  Pearla Dubonnet, MD 05/16/2022, 2:50  PM PGY-2, [place]

## 2022-05-16 NOTE — Evaluation (Signed)
Physical Therapy Evaluation Patient Details Name: Brendan [name] MRN: [id number] DOB: 1941/03/23 Today's Date: 05/16/2022  History of Present Illness  82 yo male admitted 1/29 with facial droop and weakness. PMHx: ACDF, essential tremor and spinal cord stimulator, COPD, HTN, HLD, CAD s/p CABG, dementia, glaucoma, urinary retention with baseline I &O cath  Clinical Impression  Pt with flat affect, wanting to get OOB on arrival. Per family pt sleeps in recliner and has had progressive decline in function for the last 3 weeks since a fall and unable to ambulate with increasing assist needed for all mobility. Wife at home is having trouble caring for him and agreeable to SNF. Will follow acutely to attempt to maximize strength and function to decrease burden of care.        Recommendations for follow up therapy are one component of a multi-disciplinary discharge planning process, led by the attending physician.  Recommendations may be updated based on patient status, additional functional criteria and insurance authorization.  Follow Up Recommendations Skilled nursing-short term rehab (<3 hours/day) Can patient physically be transported by private vehicle: No    Assistance Recommended at Discharge Frequent or constant Supervision/Assistance  Patient can return home with the following  A lot of help with walking and/or transfers;A lot of help with bathing/dressing/bathroom;Assistance with cooking/housework;Direct supervision/assist for medications management;Assist for transportation;Help with stairs or ramp for entrance;Assistance with feeding    Equipment Recommendations None recommended by PT  Recommendations for Other Services       Functional Status Assessment Patient has had a recent decline in their functional status and/or demonstrates limited ability to make significant improvements in function in a reasonable and predictable amount of time     Precautions / Restrictions  Precautions Precautions: Fall      Mobility  Bed Mobility Overal bed mobility: Needs Assistance Bed Mobility: Supine to Sit, Sit to Supine     Supine to sit: Min assist Sit to supine: Min guard, HOB elevated   General bed mobility comments: HOB 25 degrees with pt able to transition to sitting with min HHA, rail and increased time. Pt returned to supine with guarding due to fatigue then rose again    Transfers Overall transfer level: Needs assistance   Transfers: Sit to/from Stand, Bed to chair/wheelchair/BSC Sit to Stand: Min assist     Squat pivot transfers: Mod assist     General transfer comment: pt with heavy reliance on armrest to rise from surface x 2 trials. Mod assist with belt to perform squat pivot bed to chair, min assist to scoot to middle of seat with cues for sequence    Ambulation/Gait               General Gait Details: unable  Stairs            Wheelchair Mobility    Modified Rankin (Stroke Patients Only)       Balance Overall balance assessment: Needs assistance Sitting-balance support: Bilateral upper extremity supported, Feet supported Sitting balance-Leahy Scale: Fair Sitting balance - Comments: guarding EOB                                     Pertinent Vitals/Pain Pain Assessment Pain Assessment: No/denies pain    Home Living Family/patient expects to be discharged to:: Private residence Living Arrangements: Spouse/significant other;Children Available Help at Discharge: Family;Available 24 hours/day Type of Home: Mobile home Home Access: Ramped entrance  Home Layout: One level Home Equipment: Conservation officer, nature (2 wheels);Wheelchair - manual;BSC/3in1 Additional Comments: sleeps in recliner    Prior Function Prior Level of Function : Needs assist       Physical Assist : ADLs (physical);Mobility (physical)     Mobility Comments: pt requires assist to stand from recliner and was walking limited  distance until 3 weeks ago after fall hasn't done more than pivot ADLs Comments: total care for bathing/ dressing, catheterization     Hand Dominance        Extremity/Trunk Assessment   Upper Extremity Assessment Upper Extremity Assessment: Generalized weakness    Lower Extremity Assessment Lower Extremity Assessment: Generalized weakness    Cervical / Trunk Assessment Cervical / Trunk Assessment: Kyphotic  Communication   Communication: HOH  Cognition Arousal/Alertness: Awake/alert Behavior During Therapy: Flat affect Overall Cognitive Status: History of cognitive impairments - at baseline                                          General Comments      Exercises     Assessment/Plan    PT Assessment Patient needs continued PT services  PT Problem List Decreased strength;Decreased mobility;Decreased safety awareness;Decreased range of motion;Decreased activity tolerance;Decreased balance;Decreased cognition       PT Treatment Interventions DME instruction;Therapeutic activities;Gait training;Therapeutic exercise;Balance training;Functional mobility training;Patient/family education;Cognitive remediation    PT Goals (Current goals can be found in the Care Plan section)  Acute Rehab PT Goals Patient Stated Goal: improve function PT Goal Formulation: With family Time For Goal Achievement: 05/30/22 Potential to Achieve Goals: Poor    Frequency Min 2X/week     Co-evaluation               AM-PAC PT "6 Clicks" Mobility  Outcome Measure Help needed turning from your back to your side while in a flat bed without using bedrails?: A Little Help needed moving from lying on your back to sitting on the side of a flat bed without using bedrails?: A Lot Help needed moving to and from a bed to a chair (including a wheelchair)?: A Lot Help needed standing up from a chair using your arms (e.g., wheelchair or bedside chair)?: A Lot Help needed to walk in  hospital room?: Total Help needed climbing 3-5 steps with a railing? : Total 6 Click Score: 11    End of Session Equipment Utilized During Treatment: Gait belt Activity Tolerance: Patient tolerated treatment well Patient left: in chair;with call bell/phone within reach;with chair alarm set;with nursing/sitter in room;with family/visitor present Nurse Communication: Mobility status PT Visit Diagnosis: Other abnormalities of gait and mobility (R26.89);Muscle weakness (generalized) (M62.81)    Time: 1660-6301 PT Time Calculation (min) (ACUTE ONLY): 16 min   Charges:   PT Evaluation $PT Eval Moderate Complexity: 1 Mod          Trishelle Devora P, PT Acute Rehabilitation Services Office: [phone]   [name] Salaam Andres Escandon 05/16/2022, 2:01 PM

## 2022-05-16 NOTE — Telephone Encounter (Signed)
Called pt daughter and wife to update on his urine results. Family notified me that pt went to ED yesterday after his neurology appointment. They had issues with him being able to get CT scan (due to mobility) and recommended getting a CT at ED. In ED, UA c/f infxn so they started pt on Rocephin.  - Discussed w/ family to make appointment to f/u in clinic after hospitalization. Family appreciative of call.

## 2022-05-16 NOTE — Assessment & Plan Note (Signed)
Pt recently tx for UTI 04/16/22 in ED. Pt received Keflex. Ucx grew slamonella enteritidis (an uncommon source of UTI). CT scan at the time showed BL hydronephrosis. Pt has hx of BPH leading to bladder obstruction, wife I/O caths him at home. Afebrile, but still having abm tenderness and BL CVA tenderness today. Differential includes ongoing UTI vs hydronephrosis/BPH.  - Repeat U/A and Ucx (unable to obtain in clinic. Instructed family to bring back a urine sample from home). If still c/f infection, will send in appropriate antibx course. - Discussed making f/u w/ Urologist to discuss BPH. Scheduled for next Wednesday.

## 2022-05-16 NOTE — Assessment & Plan Note (Deleted)
K 3.2 on 1/31. -Recheck pending care goal discussions

## 2022-05-16 NOTE — ED Notes (Signed)
Pt in and out cath for 450 cc cloudy yellow urine did have sediment at the end , pt tolerated well, Ardelle Park the tech helped me , wife and daughter in room , spoke to Dr Lorin Mercy, pt given dr pepper and very small bites of biscuit, tolerating well,  PT became mildly upset when he wanted me to let go of his drink that I was helping him with due to his tremors and him spilling it, Pt is a DNR

## 2022-05-16 NOTE — Assessment & Plan Note (Addendum)
Patient more alert and conversational this morning.  Oriented to self only.  Progressive in nature, treating UTI.  Family leaning towards comfort care and hospice-decision to be made. - Haldol 1-2 mg PRN for agitation - Palliative care consulted - Tylenol scheduled

## 2022-05-16 NOTE — Telephone Encounter (Signed)
Pt's daughter called in to give an update about the patient. Forwarded call to [facility].

## 2022-05-16 NOTE — ED Notes (Signed)
Report to [place] at [facility]

## 2022-05-16 NOTE — Assessment & Plan Note (Signed)
Found to have BL hydronephrosis on CT 04/16/22 likely 2/2 BPH obstruction. Has trialed flomax in past but stopped due to limited benefit. - f/u w/ Urologist as above - Cont as needed I/O caths at home

## 2022-05-16 NOTE — Progress Notes (Signed)
FMTS Interim Progress Note  S:Night rounded w/ Dr. Larae Grooms. Pt was alert in bed, reports no comfort pain or complaints.   O: BP (!) 159/75 (BP Location: Right Arm)   Pulse 96   Temp 98.2 F (36.8 C) (Oral)   Resp 17   Ht 5\' 8"  (1.727 m)   Wt 71.2 kg   SpO2 96%   BMI 23.87 kg/m   Gen: Alert, pleasantly demented. NAD Resp: CTAB. Normal WOB on RA CV: RRR Abm: Soft, nontender, nondistended.  A/P: Pt is currently stable. I recently saw this pt in clinic and he is now more alert and comfortable appearing than he was in clinic 1/26.  - Cont plan per day team  Arlyce Dice, MD 05/16/2022, 8:29 PM PGY-1, [facility] Medicine Service pager [phone]

## 2022-05-16 NOTE — ED Notes (Signed)
Daughter called to check on pt.  RN updated family.

## 2022-05-16 NOTE — ED Notes (Signed)
Pt's monitor appeared to be taken off.  RN found pt naked in bed attempting to get out.  Three RNs assisted him in getting redressed and placed back on the monitor.  Pt had a very cherry disposition.  Bed alarm placed under pt.  He appears very comfortable in bed at this time.

## 2022-05-16 NOTE — Assessment & Plan Note (Addendum)
Ucx grew salmonella enteriditis. ID was curbsided and recommended 6wk course of amoxicillin. Given that treatment of UTI seems to have provided more comfort and improved his mentation, will continue antibx. - Cont Amoxicillin (1/29-3/11 for 6 wk total antibiotic course)

## 2022-05-16 NOTE — Assessment & Plan Note (Addendum)
Follow's w/ Neurology for dementia concerns. Family concerned about recent declining function. Reports that he has rapid decline ~42months ago and concern for more weakness on L side. Pt is in wheelchair at baseline, wife is primary caretaker and daughter helps. Family is working to get [place] for him. C/f potential vascular dementia given step-wise nature of decline. PT has follow-up with neurology early next week to discuss dementia concerns. - Family working to get home health services - f/u w/ Neurologist

## 2022-05-16 NOTE — Consult Note (Signed)
MCHP Consult for Patient, seen in person at [facility]   Patient: Brendan [name] GUR:[id number] DOB: Feb 21, 1941 DOA: 05/15/2022 DOS: the patient was seen and examined on 05/16/2022 PCP: Arlyce Dice, MD  Patient coming from: Home - lives with wife and daughter; NOK: Hansel Feinstein Takeru Bose, [phone]   Chief Complaint: Facial droop  HPI: Brendan [name] is a 82 y.o. male with medical history significant of BPH, anxiety, CAD s/p CABG, glaucoma, essential tremor with brain stimulator, HTN, HLD, dementia, and pre-DM presenting with facial droop.  He has been living at home but is increasingly hard to care for.  He is increasingly agitated, barely able to walk, cannot have conversations anymore.  While his wife would prefer for him to  remain at home, she is finding this increasingly difficult.  ?R facial droop but otherwise no new obvious focal deficits.  He was seen by neurology yesterday and sent to the ER.  He has chronic urinary retention and wife does routine I/O caths. He fell about 3 weeks ago and injured his R ankle.    ER Course:  MCHP to [facility] per Dr. Velia Meyer:  2 to 3 weeks of right hemiparesis/numbness, constant, with a few resultant ground-level mechanical falls at home, without hitting his head, no LOC.  After one of his more recent falls, he is reporting some mild right ankle discomfort, some edema.  [place] negative for acute DVT.  Saw his neurologist on 05/15/2022, sent to ED.     UA suggestive of UTI.  Negative head CT.   EDP at [facility] d/w on-call neurology, Dr. Curly Shores, who recommended transfer to [facility], for further evaluation of potential subacute stroke with MRI brain.  Neurology to formally consult. Of note, the patient's wife is procuring the controller for the patient's deep brain stimulator so that this can be modified prior to undergoing MRI of the brain.    Review of Systems: unable to review all systems due to the inability of the patient to answer questions. Past Medical History:   Diagnosis Date   Anxiety disorder    Benign enlargement of prostate    CAD in native artery 1997   Referred for CABG x3 in 2004 for LAD diagonal bifurcation lesion   Cervical spinal stenosis    Complex regional pain syndrome of right upper extremity    Right wrist; L arm   Coronary atherosclerosis of artery bypass graft 09/2011   Occluded SVG-D1; Cardiologist Dr. Ellyn Hack   Gait disorder    Gastroesophageal reflux disease    Glaucoma    Hiatal hernia    GI: Dr Penelope Coop   Hyperlipidemia LDL goal <70    Hypertension    Memory loss    Mild   Obesity    Peripheral neuropathy    possible peripheral neuropathy   Pre-diabetes    S/P CABG x 3 2004    LIMA-LAD, SVG to diagonal, SVG to OM   Tremor, essential    On Primidone   Unstable angina pectoris ([place]) 09/2011   Cardiac cath: occluded SVG-DI. Patent LIMA-LAD and SVG-OM. EF 45% with apical inferior HK.   Past Surgical History:  Procedure Laterality Date   ANTERIOR CERVICAL DECOMP/DISCECTOMY FUSION N/A 11/30/2020   Procedure: Cervical three-four, Cervical four-fve  Anterior cervical decompression/discectomy/fusion;  Surgeon: Erline Levine, MD;  Location: [place];  Service: Neurosurgery;  Laterality: N/A;   CATARACT EXTRACTION, BILATERAL     CORONARY ARTERY BYPASS GRAFT  06/2002   LIMA-LAD, SVG-D1, SVG-OM   EYE SURGERY  KNEE SURGERY Left    LEFT HEART CATH AND CORONARY ANGIOGRAPHY  1997; 2004   In 2004 was referred for CABG due to a LAD-bifurcation lesion   LEFT HEART CATHETERIZATION WITH CORONARY/GRAFT ANGIOGRAM N/A 10/13/2011   Procedure: LEFT HEART CATHETERIZATION WITH Beatrix Fetters;  Surgeon: Leonie Man, MD;  Location: [facility] CATH LAB;;  occluded SVG-DI. Patent LIMA-LAD and SVG-OM. EF 45% with apical inferior HK.;   MINOR PLACEMENT OF FIDUCIAL N/A 04/26/2017   Procedure: Fiducial placement;  Surgeon: Erline Levine, MD;  Location: [address];  Service: Neurosurgery;  Laterality: N/A;  Fiducial placement   NM MYOCAR PERF WALL  MOTION  03/23/2009   protocol:[place], normal perfusion in all regions, post-stress EF 72%, exercise capacity 7METS, EKG negative for ischemia.   PULSE GENERATOR IMPLANT Bilateral 11/13/2017   Procedure: Bilateral Implantable pulse generator;  Surgeon: Erline Levine, MD;  Location: [place];  Service: Neurosurgery;  Laterality: Bilateral;  Bilateral Implantable pulse generator   Shoulder Orthoscopic Surgery   06/2003   SPINAL CORD STIMULATOR BATTERY EXCHANGE Bilateral 02/16/2022   Procedure: Bilateral change IPG battery;  Surgeon: Dawley, Theodoro Doing, DO;  Location: [place];  Service: Neurosurgery;  Laterality: Bilateral;  RM 20 to follow 3C   SUBTHALAMIC STIMULATOR INSERTION Bilateral 05/04/2017   Procedure: Bilateral Deep brain stimulator placement;  Surgeon: Erline Levine, MD;  Location: [place];  Service: Neurosurgery;  Laterality: Bilateral;  Bilateral deep brain stimulator placement   TRANSTHORACIC ECHOCARDIOGRAM  04/2017   Jan 2019: Normal LV size and function.  EF 55-60%.  No or W MA.  Mild LA dilation.   wrist ganglion cyst Left    Social History:  reports that he quit smoking about 54 years ago. His smoking use included cigarettes. He has a 60.00 pack-year smoking history. He has never used smokeless tobacco. He reports that he does not drink alcohol and does not use drugs.  Allergies  Allergen Reactions   Lisinopril Cough    Family History  Problem Relation Age of Onset   Heart disease Mother    Heart disease Brother    Tremor Paternal Uncle    Heart disease Brother    Drug abuse Daughter    Hepatitis C Daughter     Prior to Admission medications   Medication Sig Start Date End Date Taking? Authorizing Provider  albuterol (VENTOLIN HFA) 108 (90 Base) MCG/ACT inhaler Inhale 2 puffs into the lungs every 4 (four) hours as needed for wheezing or shortness of breath. 12/23/21   Precious Gilding, DO  aspirin EC 81 MG tablet Take 1 tablet (81 mg total) by mouth daily. 02/23/22   Dawley, Troy C, DO   atorvastatin (LIPITOR) 20 MG tablet Take 1 tablet (20 mg total) by mouth daily. 06/13/21 02/16/22  Leonie Man, MD  cephALEXin (KEFLEX) 500 MG capsule Take 1 capsule (500 mg total) by mouth 4 (four) times daily. 04/16/22   Lajean Saver, MD  cephALEXin (KEFLEX) 500 MG capsule Take 1 capsule (500 mg total) by mouth 4 (four) times daily. 04/16/22   Lajean Saver, MD  Cyanocobalamin (VITAMIN B 12) 500 MCG TABS Take 1,000 mcg by mouth daily. Patient taking differently: Take 5,000 mcg by mouth daily. 08/04/20   Tat, Eustace Quail, DO  divalproex (DEPAKOTE) 250 MG DR tablet Take 1 tablet (250 mg total) by mouth 2 (two) times daily. 05/15/22   Tat, Eustace Quail, DO  fluticasone (FLONASE) 50 MCG/ACT nasal spray Place 1 spray into both nostrils daily as needed for allergies. 04/21/21  Autry-Lott, Randa Evens, DO  Hyprom-Naphaz-Polysorb-Zn Sulf (CLEAR EYES COMPLETE OP) Place 1 drop into both eyes daily as needed (dry eyes).    [provider]  loratadine (ALLERGY RELIEF) 10 MG tablet Take 1 tablet (10 mg total) by mouth daily. 05/02/22   Erick Alley, DO  Melatonin 3 MG TABS Take 1 tablet (3 mg total) by mouth at bedtime. Patient taking differently: Take 5 mg by mouth at bedtime. 06/08/17   Angiulli, Mcarthur Rossetti, PA-C  methocarbamol (ROBAXIN) 750 MG tablet TAKE 1 TABLET BY MOUTH TWICE DAILY AS NEEDED FOR MUSCLE SPASMS 03/13/22   Erick Alley, DO  nitroGLYCERIN (NITROSTAT) 0.4 MG SL tablet Place 1 tablet (0.4 mg total) under the tongue every 5 (five) minutes as needed for chest pain. call 911 if chest pain not better 03/05/18   [name], MD  nystatin cream (MYCOSTATIN) APPLY TOPICALLY TO RASH FOUR TIMES DAILYFOR TWO WEEKS Patient taking differently: Apply 1 Application topically at bedtime as needed (Rash). 06/10/21   Autry-Lott, Randa Evens, DO  omeprazole (PRILOSEC) 20 MG capsule TAKE 1 CAPSULE BY MOUTH DAILY AS NEEDED FOR ACID REFLUX 02/02/22   Alicia Amel, MD  traMADol (ULTRAM) 50 MG tablet Take 1 tablet (50  mg total) by mouth every 6 (six) hours as needed. 02/16/22 02/16/23  Dawley, Troy C, DO  traMADol (ULTRAM) 50 MG tablet Take 1 tablet (50 mg total) by mouth every 6 (six) hours as needed for moderate pain. 04/04/22   Erick Alley, DO  traZODone (DESYREL) 100 MG tablet TAKE 1 TABLET BY MOUTH EACH NIGHT AT BEDTIME 03/24/22   Erick Alley, DO  Zoster Vaccine Adjuvanted [facility]) injection Inject 0.5 ml IM and Repeat in 2 months 01/04/22   Carney Living, MD    Physical Exam: Vitals:   05/16/22 1000 05/16/22 1100 05/16/22 1200 05/16/22 1222  BP: (!) 168/77 (!) 163/88 (!) 166/76   Pulse: 83 95 91   Resp: 18 20 20    Temp:    98.4 F (36.9 C)  TempSrc:    Oral  SpO2: 97% 97% 95%   Weight:      Height:       General:  Appears confused, easily agitated Eyes:   EOMI, normal lids, iris ENT:  grossly normal hearing, lips & tongue, mmm Neck:  no LAD, masses or thyromegaly Cardiovascular:  RRR, no m/r/g. No LE edema.  Respiratory:   CTA bilaterally with no wheezes/rales/rhonchi.  Normal respiratory effort. Abdomen:  soft, NT, ND Skin:  no rash or induration seen on limited exam Musculoskeletal:  grossly normal tone BUE/BLE, good ROM, no bony abnormality; mild R ankle edema Psychiatric:  confused/agitated mood and affect, speech sparse, AOx0 - could say only his first name Neurologic:  CN 2-12 grossly intact with mild R facial droop, moves all extremities in coordinated fashion but exam is limited by his limited ability to follow directions; + marked chronic tremor   Radiological Exams on Admission: Independently reviewed - see discussion in A/P where applicable  CT Head Wo Contrast  Result Date: 05/15/2022 CLINICAL DATA:  Neuro deficit, acute, stroke suspected EXAM: CT HEAD WITHOUT CONTRAST TECHNIQUE: Contiguous axial images were obtained from the base of the skull through the vertex without intravenous contrast. RADIATION DOSE REDUCTION: This exam was performed according to the  departmental dose-optimization program which includes automated exposure control, adjustment of the mA and/or kV according to patient size and/or use of iterative reconstruction technique. COMPARISON:  Head CT 05/15/2017, brain MRI 09/07/2020 FINDINGS: Brain: No acute  intracranial hemorrhage. No evidence of acute ischemia. There is been progressive atrophy and ventricular enlargement from 2019,, but relatively stable brain volume from 2022. Bilateral deep brain stimulators in place. Minimal encephalomalacia along the left deep brain stimulator at site of prior hemorrhage. Progressive periventricular and deep white matter hypodensity typical of chronic small vessel ischemia. No subdural or extra-axial collection. Vascular: Atherosclerosis of skullbase vasculature without hyperdense vessel or abnormal calcification. Skull: No fracture or focal lesion. Sinuses/Orbits: No acute findings. Other: None. IMPRESSION: 1. No acute intracranial abnormality. 2. Bilateral deep brain stimulators in place. Minimal encephalomalacia along the left deep brain stimulator at site at site of prior hemorrhage. 3. Generalized atrophy with chronic small vessel ischemia. Degree of atrophy is similar to 2022 MRI, but progressed from 2019 CT. Electronically Signed   By: Keith Rake M.D.   On: 05/15/2022 18:41   US Venous Img Lower Unilateral Right  Result Date: 05/15/2022 CLINICAL DATA:  Right leg pain and swelling. EXAM: RIGHT LOWER EXTREMITY VENOUS DOPPLER ULTRASOUND TECHNIQUE: Gray-scale sonography with compression, as well as color and duplex ultrasound, were performed to evaluate the deep venous system(s) from the level of the common femoral vein through the popliteal and proximal calf veins. COMPARISON:  None Available. FINDINGS: VENOUS Normal compressibility of the common femoral, superficial femoral, and popliteal veins, as well as the visualized calf veins. Peroneal vein is not visualized. Visualized portions of profunda  femoral vein and great saphenous vein unremarkable. No filling defects to suggest DVT on grayscale or color Doppler imaging. Doppler waveforms show normal direction of venous flow, normal respiratory plasticity and response to augmentation. Limited views of the contralateral common femoral vein are unremarkable. OTHER None. Limitations: Soft tissue edema noted in the ankle area. IMPRESSION: Negative. Electronically Signed   By: Abelardo Diesel M.D.   On: 05/15/2022 18:20    EKG: Independently reviewed.  NSR with rate 84; LAFB; nonspecific ST changes with no evidence of acute ischemia   Labs on Admission: I have personally reviewed the available labs and imaging studies at the time of the admission.  Pertinent labs:    K+ 3.2 Glucose 134 Bili 2.0 Unremarkable CBC UA: moderate Hgb, moderate LE, >300 protein, many bacteria   Assessment and Plan: Principal Problem:   Encephalopathy Active Problems:   Hypokalemia   S/P CABG x 3   Hyperlipidemia LDL goal <70   Neurogenic bladder   Tremor, essential   UTI (urinary tract infection)   Dementia (HCC)    AMS -Patient presenting with encephalopathy as evidenced by his confusion, agitation difficulty following directions -He appears to have moderate to severe underlying dementia and it is hard to tell if this is an acute change or a progression of his underlying condition -Evaluation thus far unremarkable -There may be some current deterioration due to a UTI at this time based on abnormal UA in the setting of chronic urinary retention requiring routine I/O caths; urine culture is pending. -Based on unremarkable evaluation with current ability to protect his airway, will observe for now with telemetry monitoring; patient will be admitted to the FMTS service upon arrival at [facility] -Based on the severity of what sounds to be chronic and progressive advanced dementia, he may need placement -PT/OT, SLP (cognitive/language) evaluations with [facility] team  consult for probable placement -Delirium precautions ordered -Will add prn Haldol in case of agitation -Continue Depakote (presumably for behavior rather than seizure since Tramadol would be contraindicated if seizures are present)  R facial droop -Patient has h/o  tremor with brain stimulator in place -He may have a new facial droop but based on above it seems like obtaining an MRI would be exceedingly difficult and may not lead to significant changes -Will defer to primary team  Urinary retention -Patient with chronic retention -Wife does routine I/O cath at home -Indwelling foley discussed but wife prefers not to do this -I/O caths ordered  CAD -s/p CABG -Resume ASA upon arival  HTN -He does not appear to be taking medications for this issue at this time   HLD -Resume Lipitor  DNR -I have discussed code status with the patient 's wife and daughter and  they are in agreement that the patient would not desire resuscitation and would prefer to die a natural death should that situation arise. -He will need a gold out of facility DNR form at the time of discharge      Advance Care Planning:   Code Status: DNR   Consults: PT/OT; SLP; nutrition; TOC team; consider neurology  DVT Prophylaxis: Lovenox  Family Communication: Wife and daughter were present throughout evaluation  Severity of Illness: The appropriate patient status for this patient is OBSERVATION. Observation status is judged to be reasonable and necessary in order to provide the required intensity of service to ensure the patient's safety. The patient's presenting symptoms, physical exam findings, and initial radiographic and laboratory data in the context of their medical condition is felt to place them at decreased risk for further clinical deterioration. Furthermore, it is anticipated that the patient will be medically stable for discharge from the hospital within 2 midnights of admission.   Author: Jonah Blue, MD 05/16/2022 12:55 PM  For on call review [email or web address].

## 2022-05-16 NOTE — ED Notes (Signed)
Pt reported he needs to have a bowel movement and was placed on a bedpan.  Pt unable to produce a BM.  Bedpan removed and Pt repositioned in bed.

## 2022-05-16 NOTE — ED Notes (Signed)
Pt was getting very restless.  He stated that he had to pee but could not.  2 RN's in and out cathed him to get the urine out of his bladder.  He had 680ml drained from his bladder.  The urine at the end was full of sediment.  Pt expressed relief when RN was completed.  Pt indicated that he had to "poop" at which time he was placed on a bedpan.  He had already had a very small movement in his brief.  He was unable to have a bowel movement on the bedpan, asked for the diaper.   RN and Tec completed a full linen change once again ensuring his bed alarm was activated.  Pt continues to have a very pleasant disposition.

## 2022-05-16 NOTE — Hospital Course (Addendum)
Brendan [name] is 82 y.o. male presenting with subacute altered mental status and right hemiparesis. Pertinent PMH/PSH includes CAD s/p CABG, hypertension, hyperlipidemia, ET s/p bilateral deep brain stimulator, history of intracerebral hemorrhage, stroke, peripheral neuropathy, memory loss. His hospital course is outlined below:   Encephalopathy  Dementia: Presenting from neurologist office for further evaluation for subacute worsening speech, right-sided weakness.In the ED, VS were stable but was noted to have right LE edema. Obtained a DVT ultrasound of his right leg to rule out clot which was negative.  Head CT showed encephalomalacia and area of prior left intracranial hemorrhage but no other acute abnormality seen.   Neurology consulted in the ED and recommended admission for suspected recrudescence causing right-sided symptoms in the setting of UTI leading to falls and requesting MRI to rule out acute stroke.  Discussion with family while inpatient is leaning more towards comfort care measures.  Palliative care consulted and family is interested in meeting with hospice due to patient's poor quality of life.  After discussions with palliative, family has opted for comfort care in the hospital and additionally will go home with home hospice.  Hospital bed and***were ordered for patient.  Patient was discharged home in stable condition with home hospice.  Chronic urinary retention  chronic catheter use  chronic UTI: UA in the ED showing concern for UTI with many bacteria and leukocytes. Patient started on Rocephin (1/29).  Trialed on Flomax 0.6 mg for BPH and chronic urinary retention.  On 2/2 urine culture showed Salmonella, which is a species of bacteria that he grew on last admission.  Infectious disease was consulted and they recommended amoxicillin 1000 mg 3 times daily for 6 weeks for treatment.  Family would like to continue UTI treatment as part of comfort care measures.  At this time family  declines Foley catheter as they are concerned patient will pull on it.  Ordered SureStep catheter tray for easier intermittent catheterization at home***.   PCP Follow Up:  Urinary retention, started on Flomax.  Assess for benefit.

## 2022-05-16 NOTE — ED Notes (Signed)
Called [facility] for transport talked to [place] at 10:44

## 2022-05-16 NOTE — Progress Notes (Signed)
Hospitalist Transfer Note:  Transferring facility: [facility] Requesting provider: Dr. Sherry Ruffing (EDP at [facility]) Reason for transfer: admission for further evaluation and management of suspected subacute stroke in addition to urinary tract infection.    50 M w/ h/o tremors, chronic deep brain stimulator bilaterally, who presented to [place] ED complaining of  2 to 3 weeks of right hemiparesis as well as right-sided numbness.  The right-sided weakness and right-sided numbness have been constant over the course of the last 2 to 3 weeks, and, in the context of these symptoms, the patient has experienced a few ground-level mechanical falls at home, without hitting his head without any associated loss of consciousness.  After one of his more recent falls, he is reporting some mild right ankle discomfort, with physical exam reportedly showing some evidence of swelling.  Venous ultrasound of the right lower extremity performed in the ED today showed no evidence of acute DVT.  In the context of the aforementioned 2 to 3 weeks of right hemiparesis and right-sided numbness, the patient followed up with his outpatient neurologist on 05/15/2022, who subsequently recommended that the patient present to the emergency department for further evaluation and management thereof.  Labs were notable for urinalysis that reportedly was suggestive of UTI.  Imaging notable for CT head, which reportedly showed no evidence of acute process.  EDP at [facility] d/w on-call neurology, Dr. Curly Shores, who recommended transfer to [facility], for further evaluation of potential subacute stroke, including pursuit of MRI brain.  Neurology to formally consult. Of note, the patient's wife is procuring the controller for the patient's deep brain stimulator so that this can be modified prior to undergoing MRI of the brain.   Subsequently, I accepted this patient for transfer for observation to a med/tele bed at [facility] for further work-up and  management of the above.      Check [email or web address] for on-call coverage.   Nursing staff, Please call [place] number on Amion as soon as patient's arrival, so appropriate admitting provider can evaluate the pt.     Babs Bertin, DO Hospitalist

## 2022-05-17 DIAGNOSIS — F0280 Dementia in other diseases classified elsewhere without behavioral disturbance: Secondary | ICD-10-CM | POA: Diagnosis present

## 2022-05-17 DIAGNOSIS — Z66 Do not resuscitate: Secondary | ICD-10-CM | POA: Diagnosis present

## 2022-05-17 DIAGNOSIS — Z8249 Family history of ischemic heart disease and other diseases of the circulatory system: Secondary | ICD-10-CM | POA: Diagnosis not present

## 2022-05-17 DIAGNOSIS — T83518A Infection and inflammatory reaction due to other urinary catheter, initial encounter: Secondary | ICD-10-CM | POA: Diagnosis present

## 2022-05-17 DIAGNOSIS — N12 Tubulo-interstitial nephritis, not specified as acute or chronic: Secondary | ICD-10-CM | POA: Diagnosis present

## 2022-05-17 DIAGNOSIS — I10 Essential (primary) hypertension: Secondary | ICD-10-CM | POA: Diagnosis present

## 2022-05-17 DIAGNOSIS — E785 Hyperlipidemia, unspecified: Secondary | ICD-10-CM | POA: Diagnosis present

## 2022-05-17 DIAGNOSIS — R531 Weakness: Secondary | ICD-10-CM | POA: Diagnosis not present

## 2022-05-17 DIAGNOSIS — G309 Alzheimer's disease, unspecified: Secondary | ICD-10-CM | POA: Diagnosis present

## 2022-05-17 DIAGNOSIS — Z515 Encounter for palliative care: Secondary | ICD-10-CM | POA: Diagnosis not present

## 2022-05-17 DIAGNOSIS — G8191 Hemiplegia, unspecified affecting right dominant side: Secondary | ICD-10-CM | POA: Diagnosis present

## 2022-05-17 DIAGNOSIS — N319 Neuromuscular dysfunction of bladder, unspecified: Secondary | ICD-10-CM | POA: Diagnosis present

## 2022-05-17 DIAGNOSIS — I2510 Atherosclerotic heart disease of native coronary artery without angina pectoris: Secondary | ICD-10-CM | POA: Diagnosis present

## 2022-05-17 DIAGNOSIS — G9340 Encephalopathy, unspecified: Secondary | ICD-10-CM | POA: Diagnosis not present

## 2022-05-17 DIAGNOSIS — E876 Hypokalemia: Secondary | ICD-10-CM | POA: Diagnosis not present

## 2022-05-17 DIAGNOSIS — R338 Other retention of urine: Secondary | ICD-10-CM | POA: Diagnosis present

## 2022-05-17 DIAGNOSIS — R296 Repeated falls: Secondary | ICD-10-CM | POA: Diagnosis present

## 2022-05-17 DIAGNOSIS — Z79899 Other long term (current) drug therapy: Secondary | ICD-10-CM | POA: Diagnosis not present

## 2022-05-17 DIAGNOSIS — Y732 Prosthetic and other implants, materials and accessory gastroenterology and urology devices associated with adverse incidents: Secondary | ICD-10-CM | POA: Diagnosis present

## 2022-05-17 DIAGNOSIS — Z7189 Other specified counseling: Secondary | ICD-10-CM

## 2022-05-17 DIAGNOSIS — G9341 Metabolic encephalopathy: Secondary | ICD-10-CM | POA: Diagnosis present

## 2022-05-17 DIAGNOSIS — N3001 Acute cystitis with hematuria: Secondary | ICD-10-CM | POA: Diagnosis not present

## 2022-05-17 DIAGNOSIS — N390 Urinary tract infection, site not specified: Secondary | ICD-10-CM

## 2022-05-17 DIAGNOSIS — N401 Enlarged prostate with lower urinary tract symptoms: Secondary | ICD-10-CM | POA: Diagnosis present

## 2022-05-17 DIAGNOSIS — R4701 Aphasia: Secondary | ICD-10-CM | POA: Diagnosis present

## 2022-05-17 DIAGNOSIS — R29810 Facial weakness: Secondary | ICD-10-CM | POA: Diagnosis present

## 2022-05-17 DIAGNOSIS — Z87891 Personal history of nicotine dependence: Secondary | ICD-10-CM | POA: Diagnosis not present

## 2022-05-17 DIAGNOSIS — G9389 Other specified disorders of brain: Secondary | ICD-10-CM | POA: Diagnosis present

## 2022-05-17 DIAGNOSIS — F03B18 Unspecified dementia, moderate, with other behavioral disturbance: Secondary | ICD-10-CM

## 2022-05-17 DIAGNOSIS — F0390 Unspecified dementia without behavioral disturbance: Secondary | ICD-10-CM | POA: Diagnosis not present

## 2022-05-17 DIAGNOSIS — A029 Salmonella infection, unspecified: Secondary | ICD-10-CM | POA: Diagnosis present

## 2022-05-17 LAB — BASIC METABOLIC PANEL
Anion gap: 9 (ref 5–15)
BUN: 13 mg/dL (ref 8–23)
CO2: 27 mmol/L (ref 22–32)
Calcium: 8.8 mg/dL — ABNORMAL LOW (ref 8.9–10.3)
Chloride: 102 mmol/L (ref 98–111)
Creatinine, Ser: 1.05 mg/dL (ref 0.61–1.24)
GFR, Estimated: >60 mL/min (ref >60)
Glucose, Bld: 110 mg/dL — ABNORMAL HIGH (ref 70–99)
Potassium: 3.2 mmol/L — ABNORMAL LOW (ref 3.5–5.1)
Sodium: 138 mmol/L (ref 135–145)

## 2022-05-17 LAB — CBC
HCT: 31.8 % — ABNORMAL LOW (ref 39.0–52.0)
Hemoglobin: 11 g/dL — ABNORMAL LOW (ref 13.0–17.0)
MCH: 31 pg (ref 26.0–34.0)
MCHC: 34.6 g/dL (ref 30.0–36.0)
MCV: 89.6 fL (ref 80.0–100.0)
Platelets: 128 10*3/uL — ABNORMAL LOW (ref 150–400)
RBC: 3.55 MIL/uL — ABNORMAL LOW (ref 4.22–5.81)
RDW: 14.4 % (ref 11.5–15.5)
WBC: 6.5 10*3/uL (ref 4.0–10.5)
nRBC: 0 % (ref 0.0–0.2)

## 2022-05-17 MED ORDER — POTASSIUM CHLORIDE CRYS ER 20 MEQ PO TBCR
40.0000 meq | EXTENDED_RELEASE_TABLET | Freq: Once | ORAL | Status: AC
  Administered 2022-05-17: 40 meq via ORAL
  Filled 2022-05-17: qty 2

## 2022-05-17 MED ORDER — ACETAMINOPHEN 325 MG PO TABS
650.0000 mg | ORAL_TABLET | Freq: Four times a day (QID) | ORAL | Status: DC
  Administered 2022-05-17 – 2022-05-19 (×6): 650 mg via ORAL
  Filled 2022-05-17 (×6): qty 2

## 2022-05-17 MED ORDER — HALOPERIDOL LACTATE 5 MG/ML IJ SOLN
1.0000 mg | Freq: Four times a day (QID) | INTRAMUSCULAR | Status: DC | PRN
  Administered 2022-05-18: 1 mg via INTRAVENOUS
  Filled 2022-05-17: qty 1

## 2022-05-17 MED ORDER — ACETAMINOPHEN 650 MG RE SUPP
650.0000 mg | Freq: Four times a day (QID) | RECTAL | Status: DC
  Filled 2022-05-17: qty 1

## 2022-05-17 NOTE — Consult Note (Signed)
Consultation Note Date: 05/17/2022   Patient Name: Brendan [name]  DOB: 1941-01-13  MRN: [id number]  Age / Sex: 82 y.o., male  PCP: Arlyce Dice, MD Referring Physician: Zenia Resides, MD  Reason for Consultation: Establishing goals of care   HPI/Brief Hospital Course: 82 y.o. male  with past medical history of Alzheimer's dementia, CAD s/p CABG, HTN, HLD, essential tremor s/p bilateral deep brain stimulator, ICH, and CVA. Admitted on 05/15/2022 from home with increased confusion, increased right sided weakness and possible recurrent infection. Recently treated in the outpatient setting for UTI-recurrent infections due to BPH, wife I/O caths at home.  Wife reports increased confusion and weakness for several weeks.   CT Head-negative for acute abnormalities  Palliative medicine was consulted for assisting with goals of care conversations.  Subjective:  Extensive chart review has been completed prior to meeting patient including labs, vital signs, imaging, progress notes, orders, and available advanced directive documents from current and previous encounters.  Introduced myself as a Designer, jewellery as a member of the palliative care team. Explained palliative medicine is specialized medical care for people living with serious illness. It focuses on providing relief from the symptoms and stress of a serious illness. The goal is to improve quality of life for both the patient and the family.   Visited with Brendan [name] at his bedside. Easily awakened with calling of his name, noted dysarthria, able to communicate his name and DOB but unable to answer further orientation questions. Denies acute pain or discomfort.  Met with Brendan [name]/wife and Brendan [name]/daughter at his bedside. [name] explains she has been [name] primary caretaker in the home and over the last several weeks the caregiver burden has been great and she feels she can no longer safely provide care  for him at home. They have a daughter Brendan [name] that lives with them but she is unable to assist with care taking due to her own disability.  [name] shares a brief life review. She and Brendan [name] have been married for over 64 years,  they have 2 daughters. 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 [name] and Mr. Antonelli enjoyed bowling together when they were younger.  [name] explains she has noticed a gradual decline in [name] overall condition over the last several months but mentions a sudden progressive decline in the last few weeks. Brendan [name] has had several falls in the home and is becoming more agitated with intermittent catheterizations. She mentions his speech and ability to swallow have also decline significantly in the last several weeks.   [name] and [place] are both able to explain Alzheimer's a progressive disease process and understand [name] is likely nearing the end of the disease process.  [name] and [place] both verbalize their main concern is comfort for Brendan [name] and avoiding suffering. They are both able to explain they feel Brendan [name] has a poor QOL even prior to hospitalization. In his earlier years, Brendan [name] remained active-he was always working on something even at home.  Introduced the topic of comfort care/hospice, explaining a shift in focus to providing comfort instead of continuing with aggressive medical interventions. [name] and [place] both are open to exploring hospice eligibility and options further. Spoke with case management-relayed families wishes, Education officer, museum to assist with hospice liaison coordination. We discussed once they have had an opportunity to meet with hospice and if they choose this path we can transition to comfort care while [name] remains in the  hospital. [name] and [place] both agree with this plan.  Brendan [name] voices concerns regarding medication to help with agitation. Discussed utilizing Haldol for managing  agitation-will touch base with primary team.  [name] also concerned with placement. She nor her daughter that lives with her have the ability to drive, they depend on neighbors/friends for transportation. Daughter/Brendan [name] lives in [place]. Brendan [name] requests that Brendan [name] be placed locally in [place] if able.  [name] and [place] both voice faith being important to them as well as [name] have chaplain services visit and provide further emotional support and prayer.  All questions/concerns addressed. Emotional support provided to patient and family. PMT will continue to follow and support patient as needed.  Objective: Primary Diagnoses: Present on Admission:  Recurrent UTI  Hypokalemia  Tremor, essential  Neurogenic bladder  (Resolved) Memory problem  Hyperlipidemia LDL goal <70  Dementia (HCC)  Encephalopathy  Urinary retention   Physical Exam Constitutional:      General: He is not in acute distress.    Appearance: He is ill-appearing.  Pulmonary:     Effort: Pulmonary effort is normal. No respiratory distress.  Abdominal:     General: Abdomen is flat. There is no distension.     Palpations: Abdomen is soft.     Tenderness: There is no abdominal tenderness.  Skin:    General: Skin is warm and dry.     Findings: Bruising present.  Neurological:     Mental Status: He is alert.    Vital Signs: BP 139/74 (BP Location: Right Arm)   Pulse 74   Temp 98.3 F (36.8 C) (Oral)   Resp 18   Ht 5\' 8"  (1.727 m)   Wt 71.2 kg   SpO2 93%   BMI 23.87 kg/m  Pain Scale: 0-10 POSS *See Group Information*: 1-Acceptable,Awake and alert Pain Score: 0-No pain  Palliative Assessment/Data: 50%   Assessment and Plan  SUMMARY OF RECOMMENDATIONS   DNR TOC to coordinate hospice liaison meeting Likely transition to comfort care once family able to meet with hospice Order placed for Haldol IV 1mg  q6h PRN for agitation-will consider increased dosing if needed PMT to continue to  follow for ongoing support and needs  Thank you for this consult and allowing Palliative Medicine to participate in the care of Dj Senteno. Rich Reining. Palliative medicine will continue to follow and assist as needed.   Time Total: 75 minutes  Greater than 50%  of this time was spent counseling and coordinating care related to the above assessment and plan.  Signed by: Theodoro Grist, DNP, AGNP-C Palliative Medicine    Please contact Palliative Medicine Team phone at [phone] for questions and concerns.  For individual provider: See Shea Evans

## 2022-05-17 NOTE — Progress Notes (Signed)
     Daily Progress Note Intern Pager: [phone]  Patient name: Brendan [name] Medical record number: [id number] Date of birth: 02-Jun-1940 Age: 82 y.o. Gender: male  Primary Care Provider: Arlyce Dice, MD Consultants: Neurology Code Status: DNR  Pt Overview and Major Events to Date:  1/30: Admitted  Assessment and Plan:  Brendan [name] is 82 y.o. male presenting with subacute altered mental status and right hemiparesis. Pertinent PMH/PSH includes CAD s/p CABG, hypertension, hyperlipidemia, ET s/p bilateral deep brain stimulator, history of intracerebral hemorrhage, stroke, peripheral neuropathy, memory loss.   Palliative care consulted yesterday.  Will continue discussions with goals of care.  Patient does not have a great quality of life.  Consulting TOC to help [place] with SNF placement as family is no longer able to care for him at home.  * Encephalopathy Progressive in nature.  Unlikely to be an acute stroke.  Appears to be improving with antibiotic treatment for UTI. - Neurology consulted, appreciate recommendations - MRI if felt necessary by neuro--wife has controller for deep brain stimulator, can place in safe mode if MR is necessary - PT/OT  - VTE prophylaxis: Lovenox - AM CBC/BMP  - Fall and Delirium precautions - Cont ASA, Lipitor 20 mg  - Haldol 1-2 mg PRN for agitation - Palliative care consulted  Recurrent UTI Likely secondary to chronic catheter use. - Continue Rocephin (1/29 - 2/4) to complete 7 days of treatment, can switch to p.o. as tolerated - Follow urine culture  Urinary retention Chronically uses in and out cath at home for the last 2-3 years  - In and out cath Q6H  - start Flomax 0.4 mg daily (1/30)  Hypokalemia K 3.2 1/29 -Recheck BMP -Order 40 mEq K-Lor   FEN/GI: Heart healthy PPx: Lovenox Dispo:SNF  pending bed placement . Barriers include insurance authorization and bed placement.   Subjective:  NAEO, patient resting comfortably. Did not  require PRN haldol overnight.   Objective: Temp:  [97.7 F (36.5 C)-98.4 F (36.9 C)] 98.3 F (36.8 C) (01/31 0759) Pulse Rate:  [63-132] 74 (01/31 0759) Resp:  [15-20] 18 (01/31 0759) BP: (139-169)/(73-88) 139/74 (01/31 0759) SpO2:  [93 %-98 %] 93 % (01/31 0759) Physical Exam: Chronically ill and frail-appearing, no acute distress Cardio: Regular rate, regular rhythm, no murmurs on exam. Pulm: Clear, no wheezing, no crackles. No increased work of breathing Abdominal: bowel sounds present, soft, non-tender, non-distended Extremities: no peripheral edema  Neuro: alert, nonverbal, severely weak on exam   Laboratory: Most recent CBC Lab Results  Component Value Date   WBC 6.5 05/17/2022   HGB 11.0 (L) 05/17/2022   HCT 31.8 (L) 05/17/2022   MCV 89.6 05/17/2022   PLT 128 (L) 05/17/2022   Most recent BMP    Latest Ref Rng & Units 05/17/2022    2:42 AM  BMP  Glucose 70 - 99 mg/dL 110   BUN 8 - 23 mg/dL 13   Creatinine 0.61 - 1.24 mg/dL 1.05   Sodium 135 - 145 mmol/L 138   Potassium 3.5 - 5.1 mmol/L 3.2   Chloride 98 - 111 mmol/L 102   CO2 22 - 32 mmol/L 27   Calcium 8.9 - 10.3 mg/dL 8.8    Darci Current, DO 05/17/2022, 9:07 AM  PGY-1, [place] Intern pager: [phone], text pages welcome Secure chat group [name]

## 2022-05-17 NOTE — Evaluation (Signed)
Occupational Therapy Evaluation Patient Details Name: Brendan [name] MRN: [id number] DOB: 26-Dec-1940 Today's Date: 05/17/2022   History of Present Illness 82 yo male admitted 1/29 with facial droop and weakness. PMHx: ACDF, essential tremor and spinal cord stimulator, COPD, HTN, HLD, CAD s/p CABG, dementia, glaucoma, urinary retention with baseline I &O cath   Clinical Impression   Pt with increasing weakness and dependence in ADLs with inability to mobilize at home. Presents with impaired cognition, but agreeable to work with OT. Min assist to sit EOB and stood with min assist from elevated bed, but unable to take steps. Demonstrates fair sitting balance. Pt with B UE tremor, drinking with 2 hands to stabilize cup and with utensil and increased spillage. Pt requires min to total assist for ADLs. Recommending SNF for short term rehab.      Recommendations for follow up therapy are one component of a multi-disciplinary discharge planning process, led by the attending physician.  Recommendations may be updated based on patient status, additional functional criteria and insurance authorization.   Follow Up Recommendations  Skilled nursing-short term rehab (<3 hours/day)     Assistance Recommended at Discharge Frequent or constant Supervision/Assistance  Patient can return home with the following Two people to help with walking and/or transfers    Functional Status Assessment  Patient has had a recent decline in their functional status and/or demonstrates limited ability to make significant improvements in function in a reasonable and predictable amount of time  Equipment Recommendations  Other (comment) (defer to next venue)    Recommendations for Other Services       Precautions / Restrictions Precautions Precautions: Fall      Mobility Bed Mobility Overal bed mobility: Needs Assistance Bed Mobility: Supine to Sit, Sit to Supine     Supine to sit: Min assist Sit to supine: Min  guard, HOB elevated   General bed mobility comments: HOB up, + use of rail, pulled up on therapist's hand, returned to supine without physical assist.    Transfers Overall transfer level: Needs assistance   Transfers: Sit to/from Stand Sit to Stand: Min assist, From elevated surface           General transfer comment: min assist to rise and steady from elevated bed      Balance Overall balance assessment: Needs assistance   Sitting balance-Leahy Scale: Fair Sitting balance - Comments: min guard assist   Standing balance support: Bilateral upper extremity supported Standing balance-Leahy Scale: Poor                             ADL either performed or assessed with clinical judgement   ADL Overall ADL's : Needs assistance/impaired Eating/Feeding: Sitting;Minimal assistance Eating/Feeding Details (indicate cue type and reason): uses two hands to stabilize cup, increased spillage with utensil use Grooming: Wash/dry hands;Wash/dry face;Sitting;Minimal assistance Grooming Details (indicate cue type and reason): decreased thoroughness Upper Body Bathing: Total assistance;Sitting   Lower Body Bathing: Total assistance;Bed level   Upper Body Dressing : Maximal assistance;Sitting   Lower Body Dressing: Total assistance;Bed level       Toileting- Clothing Manipulation and Hygiene: Total assistance;Bed level               Vision Ability to See in Adequate Light: 0 Adequate Patient Visual Report: No change from baseline       Perception     Praxis      Pertinent Vitals/Pain Pain Assessment Pain Assessment:  Faces Faces Pain Scale: No hurt     Hand Dominance Right   Extremity/Trunk Assessment Upper Extremity Assessment Upper Extremity Assessment: Generalized weakness (tremulous)   Lower Extremity Assessment Lower Extremity Assessment: Defer to PT evaluation   Cervical / Trunk Assessment Cervical / Trunk Assessment: Kyphotic (weakness)    Communication Communication Communication: HOH   Cognition Arousal/Alertness: Awake/alert Behavior During Therapy: Flat affect Overall Cognitive Status: History of cognitive impairments - at baseline                                 General Comments: poor historian     General Comments       Exercises     Shoulder Instructions      Home Living Family/patient expects to be discharged to:: Private residence Living Arrangements: Spouse/significant other;Children Available Help at Discharge: Family;Available 24 hours/day Type of Home: Mobile home Home Access: Ramped entrance     Home Layout: One level     Bathroom Shower/Tub: Occupational psychologist: Standard     Home Equipment: Conservation officer, nature (2 wheels);Wheelchair - manual;BSC/3in1   Additional Comments: sleeps in recliner      Prior Functioning/Environment Prior Level of Function : Needs assist             Mobility Comments: pt requires assist to stand from recliner and was walking limited distance until 3 weeks ago after fall hasn't done more than pivot ADLs Comments: total care for bathing/ dressing, catheterization, can self feed with set up        OT Problem List: Decreased strength;Decreased activity tolerance;Impaired balance (sitting and/or standing);Decreased cognition;Decreased knowledge of use of DME or AE      OT Treatment/Interventions: Self-care/ADL training;DME and/or AE instruction;Therapeutic activities;Patient/family education;Balance training    OT Goals(Current goals can be found in the care plan section) Acute Rehab OT Goals OT Goal Formulation: Patient unable to participate in goal setting Time For Goal Achievement: 05/31/22 Potential to Achieve Goals: Fair ADL Goals Pt Will Perform Grooming: sitting;with set-up Pt Will Perform Upper Body Bathing: with mod assist;sitting Pt Will Perform Upper Body Dressing: with min assist;sitting Pt Will Transfer to Toilet:  stand pivot transfer;bedside commode;with min assist  OT Frequency: Min 2X/week    Co-evaluation              AM-PAC OT "6 Clicks" Daily Activity     Outcome Measure Help from another person eating meals?: A Little Help from another person taking care of personal grooming?: A Lot Help from another person toileting, which includes using toliet, bedpan, or urinal?: Total Help from another person bathing (including washing, rinsing, drying)?: A Lot Help from another person to put on and taking off regular upper body clothing?: A Lot Help from another person to put on and taking off regular lower body clothing?: Total 6 Click Score: 11   End of Session Equipment Utilized During Treatment: Rolling walker (2 wheels);Gait belt  Activity Tolerance: Patient tolerated treatment well Patient left: in bed;with call bell/phone within reach;with bed alarm set  OT Visit Diagnosis: Unsteadiness on feet (R26.81);Muscle weakness (generalized) (M62.81);Other symptoms and signs involving cognitive function                Time: 8119-1478 OT Time Calculation (min): 16 min Charges:  OT General Charges $OT Visit: 1 Visit OT Evaluation $OT Eval Moderate Complexity: [place], OTR/L [place] Office: [phone]  Malka So 05/17/2022, 11:17 AM

## 2022-05-17 NOTE — Social Work (Addendum)
CSW notified that pt's family is considering comfort care/ hospice at this time. Appreciate palliatives work with this pt and family. CSW met with dtr and spouse at bedside. Different options for disposition were discussed. It was agreed that SNF is not an appropriate option at this time, as pt would likely not benefit from rehab services, and also unlikely that insurance would approve it. CSW advised of limitations to insurance coverage when it comes to long term care at a facility. Family made aware it would likely be an out of pocket cost, with a substantial amount up front. Home care was also discussed. Family interested in seeing if [facility] would accept them for private pay, LTC until pt is inpatient hospice appropriate. CSW advised family of criteria to be admitted for inpatient hospice. CSW also discussed Medicaid, insurance questions, level of care questions, and any other questions that came up. Family planning to discuss all of the information provided and speak with palliative again. CSW made a plan to meet with family again tomorrow to continue to assist with disposition and DC planning. VM left for [facility] admissions. TOC will continue to follow for DC needs.

## 2022-05-18 DIAGNOSIS — G9340 Encephalopathy, unspecified: Secondary | ICD-10-CM | POA: Diagnosis not present

## 2022-05-18 DIAGNOSIS — Z7189 Other specified counseling: Secondary | ICD-10-CM | POA: Diagnosis not present

## 2022-05-18 DIAGNOSIS — Z515 Encounter for palliative care: Secondary | ICD-10-CM | POA: Diagnosis not present

## 2022-05-18 MED ORDER — ENSURE ENLIVE PO LIQD
237.0000 mL | Freq: Two times a day (BID) | ORAL | Status: DC
  Administered 2022-05-19 (×2): 237 mL via ORAL

## 2022-05-18 NOTE — Progress Notes (Signed)
   05/18/22 0930  Spiritual Encounters  Type of Visit Initial  Care provided to: Patient;Family  Referral source Patient request  Reason for visit Routine spiritual support  OnCall Visit No  Spiritual Framework  Presenting Themes Meaning/purpose/sources of inspiration;Goals in life/care;Values and beliefs;Significant life change;Coping tools;Courage hope and growth;Community and relationships  Values/beliefs family has a strong spiritual Education officer, environmental) ethic and knows how to use faith for coping and support  Community/Connection Family;Friend(s);Faith community  Family Stress Factors Financial concerns  Goals  Self/Personal Goals Finding a skill nursing facility that can be afforded and is covered by insurance  Interventions  Spiritual Care Interventions Made Established relationship of care and support;Compassionate presence;Reflective listening;Prayer  Intervention Outcomes  Outcomes Connection to spiritual care;Awareness of support;Reduced anxiety  Spiritual Care Plan  Spiritual Care Issues Still Outstanding Chaplain will continue to follow   Visited Pt, wife present.  Pt suffering with dementia but pleasant in communicating.  Spoke with wife and discovered they've been married 59 years! She's hoping to get him into a skilled care facility that his insurance will accept.  Mother being supported by 2 daughters, Lovey Newcomer and Filley. Wife appears to have a strong faith she is relying upon.

## 2022-05-18 NOTE — Progress Notes (Signed)
Initial Nutrition Assessment  DOCUMENTATION CODES:   Not applicable  INTERVENTION:   Allow pt to eat as desires Ensure Enlive po BID, each supplement provides 350 kcal and 20 grams of protein.  NUTRITION DIAGNOSIS:   Increased nutrient needs related to chronic illness (COPD) as evidenced by estimated needs.  GOAL:   Patient will meet greater than or equal to 90% of their needs  MONITOR:   PO intake, Labs, Weight trends, I & O's, Supplement acceptance  REASON FOR ASSESSMENT:   Consult Other (Comment) (Nutritional Goals)  ASSESSMENT:   82 y.o. male presented to the ED with R side weakness and confusion. PMH includes GERD, DM, COPD, CAD s/p CABG, HTN, and dementia. Pt admitted with encephalopathy and recurrent UTI.   Ongoing [place] conversations with family. Family exploring all options before deciding. Palliative Care Team following, possible transition to comfort care.  Discussed with RN. Pt ate decent for lunch, not taking in many fluids. Discussed letting pt have whatever he wants and encouraged family to bring him outside food if desired. RD to order Ensure and allow pt to drink if he desires.  Ate ~20% of one meal today.   Medications reviewed and include: Colace, IV antibiotics  Labs reviewed.  NUTRITION - FOCUSED PHYSICAL EXAM:  Deferred to follow-up.   Diet Order:   Diet Order             Diet regular Room service appropriate? Yes; Fluid consistency: Thin  Diet effective now                   EDUCATION NEEDS:   Not appropriate for education at this time  Skin:  Skin Assessment: Reviewed RN Assessment  Last BM:  1/31  Height:   Ht Readings from Last 1 Encounters:  05/15/22 5\' 8"  (1.727 m)    Weight:   Wt Readings from Last 1 Encounters:  05/15/22 71.2 kg    Ideal Body Weight:  70 kg  BMI:  Body mass index is 23.87 kg/m.  Estimated Nutritional Needs:  Kcal:  1700-1900 Protein:  85-100 grams Fluid:  >/= 1.7 L   Hermina Barters  RD, LDN Clinical Dietitian See [facility] for contact information.

## 2022-05-18 NOTE — Evaluation (Signed)
Speech Language Pathology Evaluation Patient Details Name: Brendan [name] MRN: [id number] DOB: May 12, 1940 Today's Date: 05/18/2022 Time: 7062-3762 SLP Time Calculation (min) (ACUTE ONLY): 20 min  Problem List:  Patient Active Problem List   Diagnosis Date Noted   Dementia ([place]) 05/16/2022   Hypokalemia 05/16/2022   Encephalopathy 05/16/2022   Acute cystitis with hematuria 05/16/2022   Recurrent UTI 05/15/2022   Intertrigo 01/05/2022   Dark urine 01/04/2022   Right groin pain 07/06/2021   Cervical myelopathy ([place]) 11/30/2020   COPD (chronic obstructive pulmonary disease) ([place]) 04/01/2020   Tremor, essential 05/06/2019   Neuropathy 01/06/2019   Hyperkalemia 01/06/2019   Abrasion of right heel 11/28/2017   Tremor 11/13/2017   Hydrocele, bilateral 07/17/2017   Scrotal wall edema 07/17/2017   Anemia 07/17/2017   S/P deep brain stimulator placement    Epididymitis    Healthcare maintenance 07/02/2017   Labile blood pressure    Spastic hemiparesis affecting dominant side (HCC)    Thrombocytopenia (HCC)    Agitation    Labile blood glucose    History of Enterococcus UTI 05/29/2017   Neurogenic bladder    AKI (acute kidney injury) ([place])    Hemiplegia (HCC)    Sleep disturbance    Confusion, postoperative    Hypotension due to drugs    Acute blood loss anemia    Diabetes mellitus without complication ([place])    Dysphagia    Intraparenchymal hemorrhage of brain ([place]) 05/18/2017   Weakness of right side of body    Aphasia    Intracerebral hemorrhage 05/16/2017   Cerebral edema (HCC)    Weakness 05/15/2017   Glaucoma    Anxiety state    Cognitive disorder    Urinary retention    Traumatic hemorrhage of left cerebrum without loss of consciousness (HCC)    Seborrheic dermatitis of scalp 10/05/2016   Allergic rhinitis 01/12/2014   Lower extremity edema 01/30/2013   S/P CABG x 3    Atherosclerotic heart disease of native coronary artery without angina pectoris     Hyperlipidemia LDL goal <70    Gait disorder 06/26/2012   RSD (reflex sympathetic dystrophy) 05/24/2012   Asbestosis ([place]) 05/15/2012   Anemia, B12 deficiency 03/19/2012   Coronary artery disease involving coronary bypass graft of native heart with angina pectoris ([place]) 09/16/2011   BPH (benign prostatic hyperplasia) 08/08/2011   Microscopic hematuria 02/27/2011   Anxiety 10/03/2010   Incontinence 07/21/2010   GERD 10/28/2007   Essential tremor 08/30/2006   Essential hypertension 07/06/2006   Past Medical History:  Past Medical History:  Diagnosis Date   Anxiety disorder    Benign enlargement of prostate    CAD in native artery 1997   Referred for CABG x3 in 2004 for LAD diagonal bifurcation lesion   Cervical spinal stenosis    Complex regional pain syndrome of right upper extremity    Right wrist; L arm   Coronary atherosclerosis of artery bypass graft 09/2011   Occluded SVG-D1; Cardiologist Dr. Ellyn Hack   Gait disorder    Gastroesophageal reflux disease    Glaucoma    Hiatal hernia    GI: Dr Penelope Coop   Hyperlipidemia LDL goal <70    Hypertension    Memory loss    Mild   Obesity    Peripheral neuropathy    possible peripheral neuropathy   Pre-diabetes    S/P CABG x 3 2004    LIMA-LAD, SVG to diagonal, SVG to OM   Tremor, essential    On  Primidone   Unstable angina pectoris ([place]) 09/2011   Cardiac cath: occluded SVG-DI. Patent LIMA-LAD and SVG-OM. EF 45% with apical inferior HK.   Past Surgical History:  Past Surgical History:  Procedure Laterality Date   ANTERIOR CERVICAL DECOMP/DISCECTOMY FUSION N/A 11/30/2020   Procedure: Cervical three-four, Cervical four-fve  Anterior cervical decompression/discectomy/fusion;  Surgeon: Erline Levine, MD;  Location: [facility];  Service: Neurosurgery;  Laterality: N/A;   CATARACT EXTRACTION, BILATERAL     CORONARY ARTERY BYPASS GRAFT  06/2002   LIMA-LAD, SVG-D1, SVG-OM   EYE SURGERY     KNEE SURGERY Left    LEFT HEART CATH AND  CORONARY ANGIOGRAPHY  1997; 2004   In 2004 was referred for CABG due to a LAD-bifurcation lesion   LEFT HEART CATHETERIZATION WITH CORONARY/GRAFT ANGIOGRAM N/A 10/13/2011   Procedure: LEFT HEART CATHETERIZATION WITH Beatrix Fetters;  Surgeon: Leonie Man, MD;  Location: [facility] CATH LAB;;  occluded SVG-DI. Patent LIMA-LAD and SVG-OM. EF 45% with apical inferior HK.;   MINOR PLACEMENT OF FIDUCIAL N/A 04/26/2017   Procedure: Fiducial placement;  Surgeon: Erline Levine, MD;  Location: [facility];  Service: Neurosurgery;  Laterality: N/A;  Fiducial placement   NM MYOCAR PERF WALL MOTION  03/23/2009   protocol:[place], normal perfusion in all regions, post-stress EF 72%, exercise capacity 7METS, EKG negative for ischemia.   PULSE GENERATOR IMPLANT Bilateral 11/13/2017   Procedure: Bilateral Implantable pulse generator;  Surgeon: Erline Levine, MD;  Location: [place] [facility];  Service: Neurosurgery;  Laterality: Bilateral;  Bilateral Implantable pulse generator   Shoulder Orthoscopic Surgery   06/2003   SPINAL CORD STIMULATOR BATTERY EXCHANGE Bilateral 02/16/2022   Procedure: Bilateral change IPG battery;  Surgeon: Dawley, Theodoro Doing, DO;  Location: [facility];  Service: Neurosurgery;  Laterality: Bilateral;  RM 20 to follow 3C   SUBTHALAMIC STIMULATOR INSERTION Bilateral 05/04/2017   Procedure: Bilateral Deep brain stimulator placement;  Surgeon: Erline Levine, MD;  Location: [facility];  Service: Neurosurgery;  Laterality: Bilateral;  Bilateral deep brain stimulator placement   TRANSTHORACIC ECHOCARDIOGRAM  04/2017   Jan 2019: Normal LV size and function.  EF 55-60%.  No or W MA.  Mild LA dilation.   wrist ganglion cyst Left    HPI:  82 yo male admitted 1/29 with facial droop and weakness. PMHx: ACDF, essential tremor and spinal cord stimulator, COPD, HTN, HLD, CAD s/p CABG, dementia, glaucoma, urinary retention with baseline I & O cath. CT no acute intracranial abnormality, minimal encephalomalacia along the left deep brain  stimulate at site of prior hemorrhage.   Assessment / Plan / Recommendation Clinical Impression  Pt has history of dementia and memory/cognitive deficits and wife feels like he currently has some difficulty expressing himself. His speech is intelligible and did have several episodes of phonemic paraphasias and difficulty expressing thought to therapist ("fanic" instead of "panic" without awareness). His wife states he has been having this problem at home for the past several months. Other impairments were in verbal problem solving, divergent naming. He did say January for month (off by only 1 day) and independently looked at calendar for clarificiation. Pt has had home health ST who suggested family by a clock with the date etc for orientation which they did. This therapist spoke with daughter on the phone and reinterated use of calendar, keeping pt on a schedule and checking with him periodically to see what his needs are as dtr concerned pt does not voice when he needs help. Daughter in also interested in comfort care  for her dad. SLP is not recocmmending continued treatment at this time as he has family at home to support his cognitive needs. Family in agreement with plan.    SLP Assessment  SLP Recommendation/Assessment: Patient does not need any further Speech [place] [facility] Services (has necessary help at home) SLP Visit Diagnosis: Cognitive communication deficit (R41.841)    Recommendations for follow up therapy are one component of a multi-disciplinary discharge planning process, led by the attending physician.  Recommendations may be updated based on patient status, additional functional criteria and insurance authorization.    Follow Up Recommendations  No SLP follow up    Assistance Recommended at Discharge     Functional Status Assessment Patient has had a recent decline in their functional status and demonstrates the ability to make significant improvements in function in a  reasonable and predictable amount of time.  Frequency and Duration           SLP Evaluation Cognition  Overall Cognitive Status: History of cognitive impairments - at baseline Arousal/Alertness: Awake/alert Orientation Level: Oriented to person;Oriented to place (oriented to hospital but said [facility]) Year:  (needed calendar cue) Month: January Attention: Sustained Sustained Attention: Appears intact Memory:  (nor formally assessed- memory deficits at baseline) Awareness: Impaired Awareness Impairment: Intellectual impairment;Emergent impairment;Anticipatory impairment Problem Solving: Impaired Problem Solving Impairment: Verbal basic (looked at calendar independently) Safety/Judgment: Impaired       Comprehension  Auditory Comprehension Overall Auditory Comprehension: Appears within functional limits for tasks assessed Visual Recognition/Discrimination Discrimination: Not tested Reading Comprehension Reading Status: Not tested    Expression Expression Primary Mode of Expression: Verbal Verbal Expression Overall Verbal Expression: Impaired Initiation: No impairment Level of Generative/Spontaneous Verbalization: Sentence Repetition:  (NT) Naming: Impairment Confrontation: Within functional limits Divergent:  (named 3 animals independently) Pragmatics: No impairment Written Expression Dominant Hand: Right Written Expression: Not tested   Oral / Motor  Motor Speech Overall Motor Speech: Appears within functional limits for tasks assessed Respiration: Within functional limits Phonation: Normal Resonance: Within functional limits Articulation: Within functional limitis Intelligibility: Intelligible Motor Planning: Witnin functional limits Motor Speech Errors: Not applicable            Houston Siren 05/18/2022, 9:27 AM

## 2022-05-18 NOTE — Progress Notes (Signed)
Pt became very agitated around 4:53 p.m. Pt became combative with family trying to hit and kick. Nursing was able to get patient to bed and redirect him. Pt given 1mg  Haldol per order. Nurse contacted MD to add Seroquel to med list for sundowning and agitation related to Dementia.

## 2022-05-18 NOTE — Progress Notes (Signed)
Physical Therapy Treatment Patient Details Name: Brendan [name] MRN: [id number] DOB: [birth year] Today's Date: 05/18/2022   History of Present Illness 82 yo male admitted 1/29 with facial droop and weakness. PMHx: ACDF, essential tremor and spinal cord stimulator, COPD, HTN, HLD, CAD s/p CABG, dementia, glaucoma, urinary retention with baseline I &O cath    PT Comments    Pt was seen for short session as he is tired from being in chair earlier, and family is meeting with hospice representatives during the session.  Pt was assisted to do ROM to BLE's, very reluctant and asked to have socks removed due to his painful bruising on RLE.  Will recommend taking the time to assist with ROM for improving his quality of care, as pt could easily lose ground on ROM to allow transfers and personal care to be readily done.  Will continue to see acutely and recommending SNF due to the cognitive and functional limitations for this pt's care, as well as his limited assistance to be home.  Follow up as pt tolerates for standing and exercises, and do not currently expect him to return to functional walking in the near term.   Recommendations for follow up therapy are one component of a multi-disciplinary discharge planning process, led by the attending physician.  Recommendations may be updated based on patient status, additional functional criteria and insurance authorization.  Follow Up Recommendations  Skilled nursing-short term rehab (<3 hours/day) Can patient physically be transported by private vehicle: No   Assistance Recommended at Discharge Frequent or constant Supervision/Assistance  Patient can return home with the following A lot of help with walking and/or transfers;A lot of help with bathing/dressing/bathroom;Assistance with cooking/housework;Direct supervision/assist for medications management;Assist for transportation;Help with stairs or ramp for entrance;Assistance with feeding   Equipment  Recommendations  None recommended by PT    Recommendations for Other Services       Precautions / Restrictions Precautions Precautions: Fall Restrictions Weight Bearing Restrictions: No     Mobility  Bed Mobility               General bed mobility comments: elected to stay in bed    Transfers                        Ambulation/Gait                   Stairs             Wheelchair Mobility    Modified Rankin (Stroke Patients Only)       Balance                                            Cognition Arousal/Alertness: Awake/alert Behavior During Therapy: Flat affect Overall Cognitive Status: History of cognitive impairments - at baseline                                          Exercises General Exercises - Lower Extremity Ankle Circles/Pumps: AAROM, 5 reps Heel Slides: AAROM, 10 reps Hip ABduction/ADduction: AROM, AAROM, 10 reps Straight Leg Raises: AAROM, 10 reps Hip Flexion/Marching: AROM, AAROM, 10 reps    General Comments General comments (skin integrity, edema, etc.): Pt was assisted to move on the bed minimally  to reposition LE's and then to assist with exercises.  Has very poor tolerance to move RLE especially      Pertinent Vitals/Pain Pain Assessment Faces Pain Scale: No hurt    Home Living                          Prior Function            PT Goals (current goals can now be found in the care plan section) Acute Rehab PT Goals Patient Stated Goal: none stated Progress towards PT goals: Not progressing toward goals - comment    Frequency    Min 2X/week      PT Plan Current plan remains appropriate    Co-evaluation              AM-PAC PT "6 Clicks" Mobility   Outcome Measure  Help needed turning from your back to your side while in a flat bed without using bedrails?: A Little Help needed moving from lying on your back to sitting on the side of a  flat bed without using bedrails?: A Lot Help needed moving to and from a bed to a chair (including a wheelchair)?: A Lot Help needed standing up from a chair using your arms (e.g., wheelchair or bedside chair)?: A Lot Help needed to walk in hospital room?: Total Help needed climbing 3-5 steps with a railing? : Total 6 Click Score: 11    End of Session   Activity Tolerance: Patient tolerated treatment well Patient left: with call bell/phone within reach;with nursing/sitter in room;with family/visitor present;in bed;with bed alarm set Nurse Communication: Mobility status PT Visit Diagnosis: Other abnormalities of gait and mobility (R26.89);Muscle weakness (generalized) (M62.81)     Time: 1211-1227 PT Time Calculation (min) (ACUTE ONLY): 16 min  Charges:  $Therapeutic Exercise: 8-22 mins           Ramond Dial 05/18/2022, 3:01 PM  Mee Hives, PT PhD Acute Rehab Dept. Number: [place]

## 2022-05-18 NOTE — Progress Notes (Signed)
Daily Progress Note   Patient Name: Brendan [name]      Date: 05/18/2022 DOB: [birth year]  Age: 82 y.o. MRN#: [id number] Attending Physician: Leeanne Rio, MD Primary Care Physician: Precious Gilding, DO Admit Date: 05/15/2022  Reason for Consultation/Follow-up: Establishing goals of care  HPI/Brief Hospital Review:  82 y.o. male  with past medical history of Alzheimer's dementia, CAD s/p CABG, HTN, HLD, essential tremor s/p bilateral deep brain stimulator, ICH, and CVA. Admitted on 05/15/2022 from home with increased confusion, increased right sided weakness and possible recurrent infection. Recently treated in the outpatient setting for UTI-recurrent infections due to BPH, wife I/O caths at home.   Wife reports increased confusion and weakness for several weeks.    CT Head-negative for acute abnormalities  Palliative Medicine consulted for assisting with goals of care conversations.  Subjective: Extensive chart review has been completed prior to meeting patient including labs, vital signs, imaging, progress notes, orders, and available advanced directive documents from current and previous encounters.    Visited with [name] at his bedside. Brendan [name]/wife present at bedside during time of visit. Brendan [name] stayed overnight and reports uneventful evening. Brendan [name] awake and alert, able to follow commands and answer simple orientation questions. Anticipating meeting with Janett Billow, SW to continue discussions regarding disposition options. At this time, Brendan [name] considering hospice services as they are available with a transition to comfort care.  Briefly reviewed MOST form. Plan to complete once disposition has been decided.  Addressed all questions and concerns. Brendan [name]/Brendan [name] on her way to meet  with SW and hospice liaison.  PMT to continue to follow for ongoing needs and support.  Objective:  Vital Signs: BP 125/64 (BP Location: Right Arm)   Pulse 66   Temp 97.6 F (36.4 C) (Oral)   Resp 17   Ht 5\' 8"  (1.727 m)   Wt 71.2 kg   SpO2 98%   BMI 23.87 kg/m  SpO2: SpO2: 98 % O2 Device: O2 Device: Room Air O2 Flow Rate:     Palliative Care Assessment & Plan   Assessment/Recommendation/Plan  DNR Likely transition to comfort care-awaiting disposition decision-home vs SNF with hospice services PMT to continue to follow for ongoing needs and support  Thank you for allowing the Palliative Medicine Team to assist in the care of this patient.  Total time:  25 minutes  Greater than 50%  of this time was spent counseling and coordinating care related to the above assessment and plan.  Theodoro Grist, DNP, AGNP-C Palliative Medicine   Please contact Palliative Medicine Team phone at [phone] for questions and concerns.

## 2022-05-18 NOTE — TOC Progression Note (Signed)
Transition of Care [facility]) - Progression Note    Patient Details  Name: Brendan [name] MRN: [id number] Date of Birth: [birth year]  [facility] CM/SW [place], [place] Phone Number: 05/18/2022, 1:26 PM  Clinical Narrative:    CSW met with spouse this AM and she stated speaking with a hospice representative may be beneficial to make the best decision. Daughter came to bedside as well to have questions answered.   Family noted understanding that a provider was not being picked at this time and the representative on site was there to answer general hospice questions. A Medicare list and choice will be given when a disposition is established. For Patient and family edification and comfort only.   CSW appreciates hospices input, as it helped the family to feel more prepared to make a decision. All options and hospice coverage discussed, as well as limitations.   MD also came to bedside to discuss treatment with family. Family given support and allowed time to discuss and determine the best course of action. TOC will continue to follow for DC needs.     Expected Discharge Plan and Services                                               Social Determinants of Health (SDOH) Interventions SDOH Screenings   Food Insecurity: No Food Insecurity (08/13/2019)  Housing: Low Risk  (08/13/2019)  Transportation Needs: No Transportation Needs (08/13/2019)  Alcohol Screen: Low Risk  (08/13/2019)  Depression (PHQ2-9): High Risk (01/04/2022)  Financial Resource Strain: Low Risk  (08/13/2019)  Physical Activity: Inactive (08/13/2019)  Social Connections: Moderately Integrated (08/13/2019)  Stress: No Stress Concern Present (08/13/2019)  Tobacco Use: Medium Risk (05/15/2022)    Readmission Risk Interventions     No data to display

## 2022-05-18 NOTE — Progress Notes (Signed)
This chaplain responded to PMT NP-Taylor's consult for prayer. The Pt. greeted the chaplain with a smile. The Pt. daughter-Brendan [name] introduced herself and the Pt. wife-Brendan [name].  The chaplain understands Brendan [name] visited earlier today.   Brendan [name] is grateful for both visits.  Through reflective listening the chaplain learned Brendan [name] is supported by a friend and family as she is making decisions about the Pt. admission and what comes next in the Pt. care.  Brendan [name] expressed her questions if the timing of possible comfort care is correct in the Pt. diagnosis. Brendan [name], becoming emotional in the conversation, shares she is stepping into her faith for peace.  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    The chaplain received Brendan [name]'s request for intercessory prayer and F/U spiritual care.  Chaplain Sallyanne Kuster [phone]

## 2022-05-18 NOTE — Progress Notes (Signed)
     Daily Progress Note Intern Pager: [phone]  Patient name: Brendan [name] Medical record number: [id number] Date of birth: 05-20-1940 Age: 82 y.o. Gender: male  Primary Care Provider: Precious Gilding, DO Consultants: Palliative Code Status: DNR  Pt Overview and Major Events to Date:  1/30: Admitted  Assessment and Plan: Brendan [name] is 82 y.o. male presenting with subacute altered mental status and right hemiparesis. Pertinent PMH/PSH includes CAD s/p CABG, hypertension, hyperlipidemia, ET s/p bilateral deep brain stimulator, history of intracerebral hemorrhage, stroke, peripheral neuropathy, memory loss.    Palliative care and care goal discussion yesterday.  Decision on comfort care has not yet been made, they are looking into hospice options.  TOC and palliative on board and will see patient today.  * Encephalopathy Progressive in nature.  Unlikely to be an acute stroke.  Appears to be improving with antibiotic treatment for UTI. Family is leaning towards comfort care with hospice, still making decision currently.  - Neurology consulted, appreciate recommendations - PT/OT  - Cont ASA, Lipitor 20 mg  - Haldol 1-2 mg PRN for agitation - Palliative care consulted - Tylenol scheduled   Recurrent UTI Likely secondary to chronic catheter use. Urine culture resulting >100,000 gram neg rods (1/31) - Continue Rocephin (1/29 - 2/4) to complete 7 days of treatment, can switch to p.o. as tolerated - Follow urine culture  Urinary retention Chronically uses in and out cath at home for the last 2-3 years  - In and out cath Q6H  - start Flomax 0.4 mg daily (1/30)  Hypokalemia K 3.2 on 1/31. -Recheck pending care goal discussions   FEN/GI: Heart healthy, but recommend switching to which ever diet he prefers if he goes comfort care. PPx: Lovenox Dispo: SNF pending bed placement, with anticipation of transition to hospice-all pending further discussion with TOC and palliative     Subjective:  No acute events overnight.  Patient has pleasantly demented.  Spoke with [name] (spouse).  [name] reports they will unlikely be able to afford nursing facility.  She would like to have conversation with palliative medicine and TOC to discuss more options, including hospice.  We spoke about [name]'s hobbies and their life together.  Objective: Temp:  [97.7 F (36.5 C)-98.1 F (36.7 C)] 97.7 F (36.5 C) (02/01 0435) Pulse Rate:  [70-95] 95 (02/01 0435) Resp:  [16-18] 18 (02/01 0435) BP: (118-152)/(68-73) 118/68 (02/01 0435) SpO2:  [96 %-99 %] 99 % (02/01 0435) Physical Exam: General: Pleasantly demented, not in acute distress Respiratory: Normal work of breathing on room air Skin: Warm and dry  Laboratory: Most recent CBC Lab Results  Component Value Date   WBC 6.5 05/17/2022   HGB 11.0 (L) 05/17/2022   HCT 31.8 (L) 05/17/2022   MCV 89.6 05/17/2022   PLT 128 (L) 05/17/2022   Most recent BMP    Latest Ref Rng & Units 05/17/2022    2:42 AM  BMP  Glucose 70 - 99 mg/dL 110   BUN 8 - 23 mg/dL 13   Creatinine 0.61 - 1.24 mg/dL 1.05   Sodium 135 - 145 mmol/L 138   Potassium 3.5 - 5.1 mmol/L 3.2   Chloride 98 - 111 mmol/L 102   CO2 22 - 32 mmol/L 27   Calcium 8.9 - 10.3 mg/dL 8.8    [name], DO 05/18/2022, 8:08 AM  PGY-1, [place] Intern pager: [phone], text pages welcome Secure chat group [place]

## 2022-05-19 DIAGNOSIS — Z515 Encounter for palliative care: Secondary | ICD-10-CM | POA: Diagnosis not present

## 2022-05-19 DIAGNOSIS — G9340 Encephalopathy, unspecified: Secondary | ICD-10-CM | POA: Diagnosis not present

## 2022-05-19 DIAGNOSIS — N390 Urinary tract infection, site not specified: Secondary | ICD-10-CM | POA: Diagnosis not present

## 2022-05-19 DIAGNOSIS — F0390 Unspecified dementia without behavioral disturbance: Secondary | ICD-10-CM | POA: Diagnosis not present

## 2022-05-19 LAB — URINE CULTURE

## 2022-05-19 MED ORDER — POLYVINYL ALCOHOL 1.4 % OP SOLN: 1.0000 [drp] | Freq: Four times a day (QID) | OPHTHALMIC | Status: DC | PRN

## 2022-05-19 MED ORDER — ACETAMINOPHEN 325 MG PO TABS: 650.0000 mg | ORAL_TABLET | Freq: Four times a day (QID) | ORAL | Status: DC | PRN

## 2022-05-19 MED ORDER — ASPIRIN 81 MG PO TBEC
81.0000 mg | DELAYED_RELEASE_TABLET | Freq: Every day | ORAL | Status: DC
  Administered 2022-05-19: 81 mg via ORAL
  Filled 2022-05-19: qty 1

## 2022-05-19 MED ORDER — MORPHINE SULFATE (CONCENTRATE) 10 MG/0.5ML PO SOLN: 5.0000 mg | ORAL | Status: DC | PRN

## 2022-05-19 MED ORDER — GLYCOPYRROLATE 0.2 MG/ML IJ SOLN: 0.2000 mg | INTRAMUSCULAR | Status: DC | PRN

## 2022-05-19 MED ORDER — ATORVASTATIN CALCIUM 10 MG PO TABS
20.0000 mg | ORAL_TABLET | Freq: Every day | ORAL | Status: DC
  Administered 2022-05-19: 20 mg via ORAL
  Filled 2022-05-19: qty 2

## 2022-05-19 MED ORDER — ONDANSETRON 4 MG PO TBDP: 4.0000 mg | ORAL_TABLET | Freq: Four times a day (QID) | ORAL | Status: DC | PRN

## 2022-05-19 MED ORDER — ONDANSETRON HCL 4 MG/2ML IJ SOLN: 4.0000 mg | Freq: Four times a day (QID) | INTRAMUSCULAR | Status: DC | PRN

## 2022-05-19 MED ORDER — HALOPERIDOL LACTATE 2 MG/ML PO CONC: 1.0000 mg | ORAL | Status: DC | PRN

## 2022-05-19 MED ORDER — ACETAMINOPHEN 650 MG RE SUPP: 650.0000 mg | Freq: Four times a day (QID) | RECTAL | Status: DC | PRN

## 2022-05-19 MED ORDER — GLYCOPYRROLATE 1 MG PO TABS: 1.0000 mg | ORAL_TABLET | ORAL | Status: DC | PRN

## 2022-05-19 MED ORDER — HALOPERIDOL 0.5 MG PO TABS: 0.5000 mg | ORAL_TABLET | ORAL | Status: DC | PRN

## 2022-05-19 MED ORDER — BIOTENE DRY MOUTH MT LIQD: 15.0000 mL | OROMUCOSAL | Status: DC | PRN

## 2022-05-19 MED ORDER — AMOXICILLIN 500 MG PO CAPS
1000.0000 mg | ORAL_CAPSULE | Freq: Three times a day (TID) | ORAL | Status: DC
  Administered 2022-05-19 – 2022-05-20 (×4): 1000 mg via ORAL
  Filled 2022-05-19 (×7): qty 2

## 2022-05-19 MED ORDER — HALOPERIDOL LACTATE 5 MG/ML IJ SOLN: 1.0000 mg | INTRAMUSCULAR | Status: DC | PRN

## 2022-05-19 MED ORDER — HALOPERIDOL LACTATE 5 MG/ML IJ SOLN: 0.5000 mg | INTRAMUSCULAR | Status: DC | PRN

## 2022-05-19 MED ORDER — HALOPERIDOL 1 MG PO TABS: 1.0000 mg | ORAL_TABLET | ORAL | Status: DC | PRN

## 2022-05-19 MED ORDER — HALOPERIDOL LACTATE 2 MG/ML PO CONC: 0.5000 mg | ORAL | Status: DC | PRN

## 2022-05-19 NOTE — TOC Progression Note (Signed)
Transition of Care [facility]) - Progression Note    Patient Details  Name: Brendan [name] MRN: [id number] Date of Birth: [birth year]  Transition of Care [facility]) CM/SW [place], [place] Phone Number: 05/19/2022, 3:12 PM  Clinical Narrative:    CSW met with family again and assisted them in determining the best disposition for their situation. Wife would like for pt to go home with hospice. Family was provided Medicare list and chose [facility]. Referral was made. Hospice ordering hospital bed, bed pans, hoyer lift, chucks and catheters. Dtr working to arrange private Home care. Auth started with [facility] for Ambulance transport. Gave weekend SW number for approval info. Plan to DC home when everything is in place. Medical team aware. TOC will continue to follow for DC needs.         Expected Discharge Plan and Services                                               Social Determinants of Health (SDOH) Interventions SDOH Screenings   Food Insecurity: No Food Insecurity (08/13/2019)  Housing: Low Risk  (08/13/2019)  Transportation Needs: No Transportation Needs (08/13/2019)  Alcohol Screen: Low Risk  (08/13/2019)  Depression (PHQ2-9): High Risk (01/04/2022)  Financial Resource Strain: Low Risk  (08/13/2019)  Physical Activity: Inactive (08/13/2019)  Social Connections: Moderately Integrated (08/13/2019)  Stress: No Stress Concern Present (08/13/2019)  Tobacco Use: Medium Risk (05/15/2022)    Readmission Risk Interventions     No data to display

## 2022-05-19 NOTE — Progress Notes (Signed)
FMTS Interim Progress Note  S:Spoke with Ms. Lemmie Evens about Mr. Tompson symptoms prior to admission.  She says he did have some abdominal pain intermittently for 3 weeks prior to admission. Went to ED on 12/31 and was given Rocephin x 1 and Ucx at that time grew salmonella enteritidis sensitive to ampicillin, levofloxacin and TMP-SMX. She did notice some increased white discharge from urethra prior to admission this time. She said he did not have abdominal pain increasing prior to this hospitalization - she says it is mostly when his bladder is not emptied and is relieved after I/O caths. Denies any testicular/penile pain that he has but says sometimes he complains of his right groin hurting. Denies any fevers prior to admission.   Discussed on phone with Dr. Tommy Medal with infectious disease about patient. He recommended switching to amoxicillin and treating for full 6 week course for the salmonella. Will place orders.  Gerrit Heck, MD 05/19/2022, 12:25 PM PGY-2, [place] Medicine Service pager [phone]

## 2022-05-19 NOTE — Progress Notes (Addendum)
FMTS Interim Progress Note  S: I went to evaluate patient at bedside and speak with family.  Upon arrival, Brendan [name] with palliative care was having conversation with patient's family.  I listened to conversation and updated family on Salmonella urine culture.  At this time family would like to pursue full treatment, as this aligns with their comfort care goals.  Additionally patient family would like to switch to comfort care while in hospital-we appreciate palliative's assistance.  Disposition decision was not made before I had to leave to round further-however family appears to be leaning towards home hospice.  I anticipate they will need equipment including hospital bed prior to discharge.  I was also informed that patient had Foley catheter once prior, but they prefer not to use 1 because he will often pull on it and this is difficult for them to care for at home.  Additionally I have provided reassurance to daughter Brendan [name]) that I would update her tomorrow, as she has to return to [facility].  I also provided reassurance to spouse Brendan [name]) that I would come see them tomorrow morning.  O: BP 135/74 (BP Location: Right Arm)   Pulse 66   Temp 98.2 F (36.8 C) (Oral)   Resp 16   Ht 5\' 8"  (1.727 m)   Wt 71.2 kg   SpO2 100%   BMI 23.87 kg/m   General: NAD, awake and active  A/P: Care goals Plan to switch patient to comfort care  Salmonella UTI - P.o. amoxicillin for 6-week course  [name], DO 05/19/2022, 2:07 PM PGY-1, [place] Medicine Service pager [phone]

## 2022-05-19 NOTE — Progress Notes (Signed)
Daily Progress Note   Patient Name: Brendan [name]      Date: 05/19/2022 DOB: 03/23/1941  Age: 82 y.o. MRN#: [id number] Attending Physician: Leeanne Rio, MD Primary Care Physician: Precious Gilding, DO Admit Date: 05/15/2022  Reason for Consultation/Follow-up: Establishing goals of care  HPI/Brief [name] Review:  82 y.o. male  with past medical history of Alzheimer's dementia, CAD s/p CABG, HTN, HLD, essential tremor s/p bilateral deep brain stimulator, ICH, and CVA. Admitted on 05/15/2022 from home with increased confusion, increased right sided weakness and possible recurrent infection. Recently treated in the outpatient setting for UTI-recurrent infections due to BPH, Brendan [name] I/O caths at home.   Brendan [name] reports increased confusion and weakness for several weeks.    CT Head-negative for acute abnormalities   Palliative Medicine consulted for assisting with goals of care conversations.  Subjective: Extensive chart review has been completed prior to meeting patient including labs, vital signs, imaging, progress notes, orders, and available advanced directive documents from current and previous encounters.    Visited with Brendan [name] at his bedside. Awake and alert, pleasantly confused at time of visit.  Brendan [name]/Brendan [name] and Brendan [name] present during time of visit. Both overwhelmed and voiced many concerns regarding care and disposition. Brendan [name] explained an event that occurred yesterday evening with Brendan [name] becoming verbally and physically aggressive towards her and Brendan [name]. Brendan [name] voiced concerns regarding Brendan [name] being able to safely care for Brendan [name] in the home. Extensive conversations were had regarding comfort care, hospice and disposition. There are many layers psychosocially that [place] shared that are playing a role in their decision making process. Financial concerns as transportation issues are most concerning for Brendan [name]. Initially in our visit, disposition still in question, MOST form reviewed and completed-comfort measures outlined. Brendan [name] and Brendan [name] both agreed to comfort measures while Mr. Strupp remains in the [name].  They both verbalized understanding that Brendan [name] would no longer receive aggressive medical interventions such as continuous vital signs, lab work, radiology testing, or medications not focused on comfort. All care would focus on how the patient is looking and feeling. This would include management of any symptoms that may cause discomfort, pain, shortness of breath, cough, nausea, agitation, anxiety, and/or secretions etc. Symptoms would be managed with medications and other non-pharmacological interventions such as spiritual support if requested, repositioning,  music therapy, or therapeutic listening. Family verbalized understanding and appreciation.   Brendan [name] rounding during time of visit. Updated on urine cultures, Brendan [name] and [place] both agree to continue with antibiotic treatment for UTI as this is in alignment with comfort care goals at this time. Verbalize understanding urine samples likely to not be recollected or treated unless discomfort symptoms arise and treatment aligns with comfort goals.  At end of visit, SW rounding and provided update that facility placement was not an option at this time. Brendan [name] and Brendan [name] desire to discharge home with hospice services being provided in the home setting.  Emotional support was provided to both Brendan [name] and Brendan [name]. All questions and concerns were addressed. Explained to them both that I will be unavailable until Tuesday but a provider with PMT is available through the weekend if further questions or concerns arise piror to discharge.   Objective:  Vital Signs: BP 135/74 (BP Location: Right Arm)   Pulse 66    Temp 98.2 F (36.8 C) (Oral)   Resp 16   Ht 5\' 8"  (1.727 m)   Wt 71.2 kg   SpO2 100%   BMI 23.87 kg/m  SpO2: SpO2: 100 % O2 Device: O2 Device: Room Air O2 Flow Rate:     Palliative Care Assessment & Plan   Assessment/Recommendation/Plan  Comfort Care orders placed Plan to discharge home with hospice services PMT available as needed for ongoing needs and support   Thank you for allowing the Palliative Medicine Team to assist in the care of this patient.  Total time:  65 minutes  Greater than 50%  of this time was spent counseling and coordinating care related to the above assessment and plan.  Brendan Grist, DNP, AGNP-C Palliative Medicine   Please contact Palliative Medicine Team phone at [phone] for questions and concerns.

## 2022-05-19 NOTE — Progress Notes (Signed)
     Daily Progress Note Intern Pager: [phone]  Patient name: Brendan [name] Medical record number: [id number] Date of birth: 1941/04/06 Age: 82 y.o. Gender: male  Primary Care Provider: Precious Gilding, DO Consultants: Palliative Code Status: DNR  Pt Overview and Major Events to Date:  1/30: Admitted  Assessment and Plan: Brendan [name] is 82 y.o. male presenting with subacute altered mental status and right hemiparesis. Pertinent PMH/PSH includes CAD s/p CABG, hypertension, hyperlipidemia, ET s/p bilateral deep brain stimulator, history of intracerebral hemorrhage, stroke, peripheral neuropathy, memory loss.    Palliative care and goal discussion occurred yesterday, including discussion with hospice representative.  Family has not yet made full decision.  We will continue to treat UTI at this time.  * Encephalopathy Patient more alert and conversational this morning.  Oriented to self only.  Progressive in nature, treating UTI.  Family leaning towards comfort care and hospice-decision to be made. - Haldol 1-2 mg PRN for agitation - Palliative care consulted - Tylenol scheduled   Recurrent UTI Likely secondary to chronic catheter use. Urine culture resulting >100,000 gram neg rods (1/31) - Continue Rocephin (1/29 - 2/4) to complete 7 days of treatment, can switch to p.o. as tolerated - Follow urine culture  Urinary retention - Consider placing Foley for patient comfort - In and out cath Q6H  - start Flomax 0.4 mg daily (1/30)   FEN/GI: Regular diet PPx: Lovenox Dispo: Pending comfort care and hospice decision. Subjective:  Patient reports being comfortable this morning.  Family not at bedside.  No acute events overnight.  Objective: Temp:  [97.6 F (36.4 C)-98.3 F (36.8 C)] 98.2 F (36.8 C) (02/02 0806) Pulse Rate:  [60-94] 66 (02/02 0806) Resp:  [16-17] 16 (02/02 0806) BP: (125-186)/(64-82) 135/74 (02/02 0806) SpO2:  [94 %-100 %] 100 % (02/02 0806) Physical  Exam: General: NAD, sitting up in bed drinking protein shake Cardiovascular: RRR, no MRG Respiratory: CTAB, normal breathing on room air Skin: Warm and dry  Laboratory: Most recent CBC Lab Results  Component Value Date   WBC 6.5 05/17/2022   HGB 11.0 (L) 05/17/2022   HCT 31.8 (L) 05/17/2022   MCV 89.6 05/17/2022   PLT 128 (L) 05/17/2022   Most recent BMP    Latest Ref Rng & Units 05/17/2022    2:42 AM  BMP  Glucose 70 - 99 mg/dL 110   BUN 8 - 23 mg/dL 13   Creatinine 0.61 - 1.24 mg/dL 1.05   Sodium 135 - 145 mmol/L 138   Potassium 3.5 - 5.1 mmol/L 3.2   Chloride 98 - 111 mmol/L 102   CO2 22 - 32 mmol/L 27   Calcium 8.9 - 10.3 mg/dL 8.8     [name], DO 05/19/2022, 9:00 AM  PGY-1, [place] Intern pager: [phone], text pages welcome Secure chat group [place]

## 2022-05-20 DIAGNOSIS — G9340 Encephalopathy, unspecified: Secondary | ICD-10-CM | POA: Diagnosis not present

## 2022-05-20 MED ORDER — AMOXICILLIN 500 MG PO CAPS: 1000.0000 mg | ORAL_CAPSULE | Freq: Three times a day (TID) | ORAL | 0 refills | Status: AC

## 2022-05-20 MED ORDER — TAMSULOSIN HCL 0.4 MG PO CAPS: 0.4000 mg | ORAL_CAPSULE | Freq: Every day | ORAL | 0 refills | Status: DC

## 2022-05-20 MED ORDER — POLYETHYLENE GLYCOL 3350 17 G PO PACK: 17.0000 g | PACK | Freq: Every day | ORAL | 0 refills | Status: DC | PRN

## 2022-05-20 MED ORDER — HALOPERIDOL 1 MG PO TABS: 1.0000 mg | ORAL_TABLET | ORAL | 0 refills | Status: DC | PRN

## 2022-05-20 MED ORDER — MORPHINE SULFATE (CONCENTRATE) 10 MG/0.5ML PO SOLN: 5.0000 mg | ORAL | 0 refills | Status: DC | PRN

## 2022-05-20 MED ORDER — MORPHINE SULFATE (CONCENTRATE) 10 MG/0.5ML PO SOLN: 5.0000 mg | ORAL | 0 refills | Status: AC | PRN

## 2022-05-20 MED ORDER — HALOPERIDOL LACTATE 2 MG/ML PO CONC: 1.0000 mg | ORAL | 0 refills | Status: DC | PRN

## 2022-05-20 MED ORDER — ONDANSETRON 4 MG PO TBDP: 4.0000 mg | ORAL_TABLET | Freq: Four times a day (QID) | ORAL | 0 refills | Status: DC | PRN

## 2022-05-20 MED ORDER — NITROGLYCERIN 0.4 MG SL SUBL: 0.4000 mg | SUBLINGUAL_TABLET | SUBLINGUAL | 3 refills | Status: DC | PRN

## 2022-05-20 MED ORDER — GLYCOPYRROLATE 1 MG PO TABS: 1.0000 mg | ORAL_TABLET | ORAL | 0 refills | Status: DC | PRN

## 2022-05-20 MED ORDER — BISACODYL 5 MG PO TBEC: 5.0000 mg | DELAYED_RELEASE_TABLET | Freq: Every day | ORAL | 0 refills | Status: DC | PRN

## 2022-05-20 NOTE — Discharge Summary (Signed)
[facility] Discharge Summary  Patient name: Brendan [name] Medical record number: [id number] Date of birth: October 08, 1940 Age: 82 y.o. Gender: male Date of Admission: 05/15/2022  Date of Discharge: 05/20/2022 Admitting Physician: Zenia Resides, MD  Primary Care Provider: Precious Gilding, DO Consultants: None  Indication for Hospitalization: Encephalopathy  Brief Hospital Course:  Brendan [name] is 82 y.o. male presenting with subacute altered mental status and right hemiparesis. Pertinent PMH/PSH includes CAD s/p CABG, hypertension, hyperlipidemia, ET s/p bilateral deep brain stimulator, history of intracerebral hemorrhage, stroke, peripheral neuropathy, memory loss. His hospital course is outlined below:   Encephalopathy  Dementia: Presenting from neurologist office for further evaluation for subacute worsening speech, right-sided weakness.In the ED, VS were stable but was noted to have right LE edema. Obtained a DVT ultrasound of his right leg to rule out clot which was negative.  Head CT showed encephalomalacia and area of prior left intracranial hemorrhage but no other acute abnormality seen.   Neurology consulted in the ED and recommended admission for suspected recrudescence causing right-sided symptoms in the setting of UTI leading to falls and requesting MRI to rule out acute stroke.  Discussion with family while inpatient is leaning more towards comfort care measures.  Palliative care consulted and family is interested in meeting with hospice due to patient's poor quality of life.  After discussions with palliative, family has opted for comfort care in the hospital and additionally will go home with home hospice.  Hospital bed and were ordered for patient.  Patient was discharged home in stable condition with home hospice.  Chronic urinary retention  chronic catheter use  chronic UTI: UA in the ED showing concern for UTI with many bacteria and leukocytes.  Patient started on Rocephin (1/29).  Trialed on Flomax 0.6 mg for BPH and chronic urinary retention.  On 2/2 urine culture showed Salmonella-no resistance patterns, which is a species of bacteria that he grew on last admission.  Infectious disease was consulted and they recommended amoxicillin 1000 mg 3 times daily for 6 weeks for treatment.  Family would like to continue UTI treatment as part of comfort care measures.  At this time family declines Foley catheter as they are concerned patient will pull on it. DME ordered for patient.   PCP Follow Up:  Urinary retention, started on Flomax.  Assess for benefit.  Disposition: Home with hospice  Discharge Condition: Stable  Discharge Exam: Per Dr. Markus Jarvis Physical Exam: General: Alert, cheerful and pleasant, pleasantly demented older man. Cardiovascular: RRR Respiratory: CTAB. Normal WOB on RA. Abdomen: Tender to palpation in suprapubic region. Normal BS. No rigidity or guarding.   Significant Procedures: None  Significant Labs and Imaging:  No results for input(s): "WBC", "HGB", "HCT", "PLT" in the last 48 hours. No results for input(s): "NA", "K", "CL", "CO2", "GLUCOSE", "BUN", "CREATININE", "CALCIUM", "MG", "PHOS", "ALKPHOS", "AST", "ALT", "ALBUMIN", "PROTEIN" in the last 48 hours.   Results/Tests Pending at Time of Discharge:   Discharge Medications:  Allergies as of 05/20/2022       Reactions   Lisinopril Cough        Medication List     STOP taking these medications    aspirin EC 81 MG tablet   atorvastatin 20 MG tablet Commonly known as: LIPITOR   divalproex 250 MG DR tablet Commonly known as: Depakote   methocarbamol 750 MG tablet Commonly known as: ROBAXIN   traMADol 50 MG tablet Commonly known as: ULTRAM   Vitamin B  12 500 MCG Tabs       TAKE these medications    albuterol 108 (90 Base) MCG/ACT inhaler Commonly known as: VENTOLIN HFA Inhale 2 puffs into the lungs every 4 (four) hours as needed for  wheezing or shortness of breath.   amoxicillin 500 MG capsule Commonly known as: AMOXIL Take 2 capsules (1,000 mg total) by mouth every 8 (eight) hours for 110 doses.   bisacodyl 5 MG EC tablet Commonly known as: DULCOLAX Take 1 tablet (5 mg total) by mouth daily as needed for moderate constipation.   CLEAR EYES COMPLETE OP Place 1 drop into both eyes daily as needed (dry eyes).   fluticasone 50 MCG/ACT nasal spray Commonly known as: FLONASE Place 1 spray into both nostrils daily as needed for allergies.   glycopyrrolate 1 MG tablet Commonly known as: ROBINUL Take 1 tablet (1 mg total) by mouth every 4 (four) hours as needed (excessive secretions).   haloperidol 1 MG tablet Commonly known as: HALDOL Take 1 tablet (1 mg total) by mouth every 4 (four) hours as needed for agitation (or delirium).   haloperidol 2 MG/ML solution Commonly known as: HALDOL Place 0.5 mLs (1 mg total) under the tongue every 4 (four) hours as needed for agitation (or delirium).   loratadine 10 MG tablet Commonly known as: Allergy Relief Take 1 tablet (10 mg total) by mouth daily.   melatonin 3 MG Tabs tablet Take 1 tablet (3 mg total) by mouth at bedtime. What changed: how much to take   morphine CONCENTRATE 10 MG/0.5ML Soln concentrated solution Take 0.25 mLs (5 mg total) by mouth every 2 (two) hours as needed for moderate pain (or dyspnea).   morphine CONCENTRATE 10 MG/0.5ML Soln concentrated solution Place 0.25 mLs (5 mg total) under the tongue every 2 (two) hours as needed for moderate pain (or dyspnea).   nitroGLYCERIN 0.4 MG SL tablet Commonly known as: NITROSTAT Place 1 tablet (0.4 mg total) under the tongue every 5 (five) minutes as needed for chest pain. call 911 if chest pain not better   nystatin cream Commonly known as: MYCOSTATIN APPLY TOPICALLY TO RASH FOUR TIMES DAILYFOR TWO WEEKS What changed: See the new instructions.   omeprazole 20 MG capsule Commonly known as:  PRILOSEC TAKE 1 CAPSULE BY MOUTH DAILY AS NEEDED FOR ACID REFLUX What changed: See the new instructions.   ondansetron 4 MG disintegrating tablet Commonly known as: ZOFRAN-ODT Take 1 tablet (4 mg total) by mouth every 6 (six) hours as needed for nausea.   polyethylene glycol 17 g packet Commonly known as: MIRALAX / GLYCOLAX Take 17 g by mouth daily as needed for mild constipation.   tamsulosin 0.4 MG Caps capsule Commonly known as: FLOMAX Take 1 capsule (0.4 mg total) by mouth daily. Start taking on: May 21, 2022   traZODone 100 MG tablet Commonly known as: DESYREL TAKE 1 TABLET BY MOUTH EACH NIGHT AT BEDTIME What changed: See the new instructions.        Discharge Instructions: Please refer to Patient Instructions section of EMR for full details.  Patient was counseled important signs and symptoms that should prompt return to medical care, changes in medications, dietary instructions, activity restrictions, and follow up appointments.    Gerrit Heck, MD 05/20/2022, 3:17 PM PGY-2, [place]

## 2022-05-20 NOTE — Discharge Instructions (Addendum)
Dear Brendan [name],   Thank you so much for allowing [place] to be part of your care!  You were admitted to [facility] for altered mental status. You were also found to have a UTI that we are treating you with amoxicillin for 6 total weeks   POST-HOSPITAL & CARE INSTRUCTIONS We are putting in hospice orders Please let PCP/Specialists know of any changes that were made.  Please see medications section of this packet for any medication changes.   DOCTOR'S APPOINTMENT & FOLLOW UP CARE INSTRUCTIONS  Future Appointments  Date Time Provider [place]  09/14/2022  1:30 PM Tat, Eustace Quail, DO [facility] None   Take care and be well!  [facility]  [place], [place] [address] [phone]

## 2022-05-20 NOTE — TOC Progression Note (Signed)
Transition of Care [facility]) - Progression Note    Patient Details  Name: Brendan [name] MRN: [id number] Date of Birth: 03-26-1941  Transition of Care [facility]) CM/SW [place], [place] Phone Number: 05/20/2022, 8:51 AM  Clinical Narrative:      SW informed [facility] Bi approved [phone]      Expected Discharge Plan and Services                                               Social Determinants of Health (SDOH) Interventions SDOH Screenings   Food Insecurity: No Food Insecurity (08/13/2019)  Housing: Low Risk  (08/13/2019)  Transportation Needs: No Transportation Needs (08/13/2019)  Alcohol Screen: Low Risk  (08/13/2019)  Depression (PHQ2-9): High Risk (01/04/2022)  Financial Resource Strain: Low Risk  (08/13/2019)  Physical Activity: Inactive (08/13/2019)  Social Connections: Moderately Integrated (08/13/2019)  Stress: No Stress Concern Present (08/13/2019)  Tobacco Use: Medium Risk (05/15/2022)    Readmission Risk Interventions     No data to display

## 2022-05-20 NOTE — TOC Transition Note (Signed)
Transition of Care [facility]) - CM/SW Discharge Note   Patient Details  Name: Brendan [name] MRN: [id number] Date of Birth: 1940-07-19  Transition of Care [facility]) CM/SW Contact:  Ina Homes, [place] Phone Number: 05/20/2022, 3:05 PM   Clinical Narrative:     SW spoke with pt's daughter Lovey Newcomer [phone]) confirmed delivery of DME.   SW requested d/c orders, and hospice order from [place] team.   SW spoke with [facility]) confirmed nurse on standby to admit once pt home.   PTAR called.  Final next level of care: Home w Hospice Care Barriers to Discharge: Barriers Resolved   Patient Goals and CMS Choice      Discharge Placement                  Patient to be transferred to facility by: [place] Name of family member notified: [place] Patient and family notified of of transfer: 05/20/22  Discharge Plan and Services Additional resources added to the After Visit Summary for                    DME Agency: [facility] Determinants of Health ([place]) Interventions SDOH Screenings   Food Insecurity: No Food Insecurity (08/13/2019)  Housing: Low Risk  (08/13/2019)  Transportation Needs: No Transportation Needs (08/13/2019)  Alcohol Screen: Low Risk  (08/13/2019)  Depression (PHQ2-9): High Risk (01/04/2022)  Financial Resource Strain: Low Risk  (08/13/2019)  Physical Activity: Inactive (08/13/2019)  Social Connections: Moderately Integrated (08/13/2019)  Stress: No Stress Concern Present (08/13/2019)  Tobacco Use: Medium Risk (05/15/2022)     Readmission Risk Interventions     No data to display

## 2022-05-20 NOTE — Progress Notes (Signed)
     Daily Progress Note Intern Pager: [phone]  Patient name: Brendan [name] Medical record number: [id number] Date of birth: 04/06/1941 Age: 82 y.o. Gender: male  Primary Care Provider: Precious Gilding, DO Consultants: Palliative Code Status: DNR  Pt Overview and Major Events to Date:  1/30 Admitted  Assessment and Plan: Brendan [name] is 82 y.o. male presenting for stroke workup and pyelonephritis. Pt and family have since transitioned to comfort care.  Pertinent PMH/PSH includes CAD s/p CABG, hypertension, hyperlipidemia, ET s/p bilateral deep brain stimulator, history of intracerebral hemorrhage, stroke, peripheral neuropathy, memory loss.     * Encephalopathy I personally saw this pt in clinic last week and throughout his admission. He is now much improved mentation, which I suspect is due to treatment of his pyelonephritis and improved comfort. Pt is now much more verbal and alert, participating in conversation, and has a more cheerful demeanor. Family has discussed w/ Palliative and have decided to transition to comfort care. Will continue antibiotic for UTI (see below). - Palliative consulted, appreciate recs - Continue comfort care orders per palliative. - Awaiting approval for [facility] hospice services  Recurrent UTI Ucx grew salmonella enteriditis. ID was curbsided and recommended 6wk course of amoxicillin. Given that treatment of UTI seems to have provided more comfort and improved his mentation, will continue antibx. - Cont Amoxicillin (1/29-3/11 for 6 wk total antibiotic course)  Urinary retention Had suprapubic tenderness and bladder scan showed 279, I/O cathed 300cc.  - Consider placing Foley for patient comfort - In and out cath Q6H  - cont Flomax 0.4 mg daily   FEN/GI: Regular PPx: None Dispo:Home with home health today. Barriers include pending [place] hospice services.   Subjective:  NAEO.   Objective: Temp:  [97.8 F (36.6 C)-98.3 F (36.8 C)] 98 F (36.7 C)  (02/03 0451) Pulse Rate:  [66-78] 67 (02/03 0451) Resp:  [16-17] 17 (02/03 0451) BP: (135-160)/(70-77) 141/77 (02/03 0451) SpO2:  [97 %-100 %] 98 % (02/03 0451) Physical Exam: General: Alert, cheerful and pleasant, pleasantly demented older man. Cardiovascular: RRR Respiratory: CTAB. Normal WOB on RA. Abdomen: Tender to palpation in suprapubic region. Normal BS. No rigidity or guarding.   Laboratory: Most recent CBC Lab Results  Component Value Date   WBC 6.5 05/17/2022   HGB 11.0 (L) 05/17/2022   HCT 31.8 (L) 05/17/2022   MCV 89.6 05/17/2022   PLT 128 (L) 05/17/2022   Most recent BMP    Latest Ref Rng & Units 05/17/2022    2:42 AM  BMP  Glucose 70 - 99 mg/dL 110   BUN 8 - 23 mg/dL 13   Creatinine 0.61 - 1.24 mg/dL 1.05   Sodium 135 - 145 mmol/L 138   Potassium 3.5 - 5.1 mmol/L 3.2   Chloride 98 - 111 mmol/L 102   CO2 22 - 32 mmol/L 27   Calcium 8.9 - 10.3 mg/dL 8.8    Brendan Dice, MD 05/20/2022, 7:49 AM  PGY-1, [place] Intern pager: [phone], text pages welcome Secure chat group [place]

## 2022-05-29 ENCOUNTER — Other Ambulatory Visit (HOSPITAL_COMMUNITY): Payer: Self-pay

## 2022-05-29 ENCOUNTER — Telehealth: Payer: Self-pay

## 2022-05-29 ENCOUNTER — Other Ambulatory Visit: Payer: Self-pay | Admitting: Student

## 2022-05-29 NOTE — Telephone Encounter (Signed)
A Prior Authorization was initiated for this patients ONDANSETRON through CoverMyMeds.   Key: [id number]

## 2022-06-01 ENCOUNTER — Telehealth: Payer: Self-pay

## 2022-06-01 NOTE — Telephone Encounter (Signed)
A Prior Authorization was initiated for this patients GLYCOPYRROLATE through CoverMyMeds.   Key: [id number]

## 2022-06-01 NOTE — Telephone Encounter (Signed)
Prior Auth for patients medication ONDANSETRON denied by [facility] [employer] via CoverMyMeds.   Reason: Drug must meet the following therapeutic criteria: diagnosis of chemotherapy or radiation induced nausea/vomiting or prophylaxis of post-surgical nausea/vomiting, before induction of anethesia or shortly after surgery  CoverMyMeds Key: [id number]

## 2022-06-05 NOTE — Telephone Encounter (Addendum)
Prior Auth for patients medication GLYCOPYRROLATE denied via fax by [facility] [employer] via CoverMyMeds.   Reason:    DENIAL LETTER SCANNED TO MEDIA  CoverMyMeds Key: [id number]

## 2022-06-06 ENCOUNTER — Encounter (HOSPITAL_COMMUNITY): Payer: Self-pay

## 2022-06-06 ENCOUNTER — Other Ambulatory Visit: Payer: Self-pay

## 2022-06-06 ENCOUNTER — Inpatient Hospital Stay (HOSPITAL_COMMUNITY)
Admission: EM | Admit: 2022-06-06 | Discharge: 2022-06-12 | DRG: 884 | Disposition: A | Discharge status: Hospice | Payer: PPO | Attending: Family Medicine | Admitting: Family Medicine

## 2022-06-06 ENCOUNTER — Emergency Department (HOSPITAL_COMMUNITY): Payer: PPO

## 2022-06-06 DIAGNOSIS — F0392 Unspecified dementia, unspecified severity, with psychotic disturbance: Secondary | ICD-10-CM | POA: Diagnosis present

## 2022-06-06 DIAGNOSIS — F03918 Unspecified dementia, unspecified severity, with other behavioral disturbance: Secondary | ICD-10-CM

## 2022-06-06 DIAGNOSIS — E876 Hypokalemia: Secondary | ICD-10-CM | POA: Diagnosis present

## 2022-06-06 DIAGNOSIS — Z66 Do not resuscitate: Secondary | ICD-10-CM | POA: Diagnosis present

## 2022-06-06 DIAGNOSIS — R410 Disorientation, unspecified: Principal | ICD-10-CM

## 2022-06-06 DIAGNOSIS — R29810 Facial weakness: Secondary | ICD-10-CM | POA: Diagnosis present

## 2022-06-06 DIAGNOSIS — Z888 Allergy status to other drugs, medicaments and biological substances status: Secondary | ICD-10-CM

## 2022-06-06 DIAGNOSIS — D6959 Other secondary thrombocytopenia: Secondary | ICD-10-CM | POA: Diagnosis present

## 2022-06-06 DIAGNOSIS — Z515 Encounter for palliative care: Principal | ICD-10-CM

## 2022-06-06 DIAGNOSIS — G250 Essential tremor: Secondary | ICD-10-CM | POA: Diagnosis present

## 2022-06-06 DIAGNOSIS — N390 Urinary tract infection, site not specified: Secondary | ICD-10-CM | POA: Diagnosis present

## 2022-06-06 DIAGNOSIS — R471 Dysarthria and anarthria: Secondary | ICD-10-CM | POA: Diagnosis present

## 2022-06-06 DIAGNOSIS — H409 Unspecified glaucoma: Secondary | ICD-10-CM | POA: Diagnosis present

## 2022-06-06 DIAGNOSIS — I2510 Atherosclerotic heart disease of native coronary artery without angina pectoris: Secondary | ICD-10-CM | POA: Diagnosis present

## 2022-06-06 DIAGNOSIS — E079 Disorder of thyroid, unspecified: Secondary | ICD-10-CM | POA: Diagnosis present

## 2022-06-06 DIAGNOSIS — F03911 Unspecified dementia, unspecified severity, with agitation: Secondary | ICD-10-CM | POA: Diagnosis present

## 2022-06-06 DIAGNOSIS — A028 Other specified salmonella infections: Secondary | ICD-10-CM | POA: Diagnosis present

## 2022-06-06 DIAGNOSIS — Z79890 Hormone replacement therapy (postmenopausal): Secondary | ICD-10-CM

## 2022-06-06 DIAGNOSIS — R451 Restlessness and agitation: Secondary | ICD-10-CM | POA: Diagnosis not present

## 2022-06-06 DIAGNOSIS — Z8673 Personal history of transient ischemic attack (TIA), and cerebral infarction without residual deficits: Secondary | ICD-10-CM

## 2022-06-06 DIAGNOSIS — Z79899 Other long term (current) drug therapy: Secondary | ICD-10-CM

## 2022-06-06 DIAGNOSIS — R338 Other retention of urine: Secondary | ICD-10-CM | POA: Diagnosis present

## 2022-06-06 DIAGNOSIS — F02811 Dementia in other diseases classified elsewhere, unspecified severity, with agitation: Secondary | ICD-10-CM | POA: Diagnosis present

## 2022-06-06 DIAGNOSIS — I10 Essential (primary) hypertension: Secondary | ICD-10-CM | POA: Diagnosis present

## 2022-06-06 DIAGNOSIS — K219 Gastro-esophageal reflux disease without esophagitis: Secondary | ICD-10-CM | POA: Diagnosis present

## 2022-06-06 DIAGNOSIS — G629 Polyneuropathy, unspecified: Secondary | ICD-10-CM | POA: Diagnosis present

## 2022-06-06 DIAGNOSIS — Z7401 Bed confinement status: Secondary | ICD-10-CM

## 2022-06-06 DIAGNOSIS — N401 Enlarged prostate with lower urinary tract symptoms: Secondary | ICD-10-CM | POA: Diagnosis present

## 2022-06-06 DIAGNOSIS — Z87891 Personal history of nicotine dependence: Secondary | ICD-10-CM

## 2022-06-06 DIAGNOSIS — R4182 Altered mental status, unspecified: Secondary | ICD-10-CM

## 2022-06-06 DIAGNOSIS — Z8249 Family history of ischemic heart disease and other diseases of the circulatory system: Secondary | ICD-10-CM

## 2022-06-06 DIAGNOSIS — Z87440 Personal history of urinary (tract) infections: Secondary | ICD-10-CM

## 2022-06-06 DIAGNOSIS — E785 Hyperlipidemia, unspecified: Secondary | ICD-10-CM | POA: Diagnosis present

## 2022-06-06 DIAGNOSIS — Z951 Presence of aortocoronary bypass graft: Secondary | ICD-10-CM

## 2022-06-06 DIAGNOSIS — F05 Delirium due to known physiological condition: Secondary | ICD-10-CM | POA: Diagnosis present

## 2022-06-06 DIAGNOSIS — Z981 Arthrodesis status: Secondary | ICD-10-CM

## 2022-06-06 LAB — URINALYSIS, ROUTINE W REFLEX MICROSCOPIC
Bilirubin Urine: NEGATIVE
Glucose, UA: NEGATIVE mg/dL
Ketones, ur: NEGATIVE mg/dL
Leukocytes,Ua: NEGATIVE
Nitrite: NEGATIVE
Protein, ur: 100 mg/dL — AB
Specific Gravity, Urine: 1.014 (ref 1.005–1.030)
pH: 6 (ref 5.0–8.0)

## 2022-06-06 LAB — COMPREHENSIVE METABOLIC PANEL
ALT: 9 U/L (ref 0–44)
AST: 20 U/L (ref 15–41)
Albumin: 3.5 g/dL (ref 3.5–5.0)
Alkaline Phosphatase: 74 U/L (ref 38–126)
Anion gap: 11 (ref 5–15)
BUN: 9 mg/dL (ref 8–23)
CO2: 28 mmol/L (ref 22–32)
Calcium: 9 mg/dL (ref 8.9–10.3)
Chloride: 98 mmol/L (ref 98–111)
Creatinine, Ser: 1 mg/dL (ref 0.61–1.24)
GFR, Estimated: >60 mL/min (ref >60)
Glucose, Bld: 138 mg/dL — ABNORMAL HIGH (ref 70–99)
Potassium: 3.2 mmol/L — ABNORMAL LOW (ref 3.5–5.1)
Sodium: 137 mmol/L (ref 135–145)
Total Bilirubin: 1.3 mg/dL — ABNORMAL HIGH (ref 0.3–1.2)
Total Protein: 6.4 g/dL — ABNORMAL LOW (ref 6.5–8.1)

## 2022-06-06 LAB — I-STAT CHEM 8, ED
BUN: 9 mg/dL (ref 8–23)
Calcium, Ion: 1.17 mmol/L (ref 1.15–1.40)
Chloride: 98 mmol/L (ref 98–111)
Creatinine, Ser: 0.9 mg/dL (ref 0.61–1.24)
Glucose, Bld: 133 mg/dL — ABNORMAL HIGH (ref 70–99)
HCT: 38 % — ABNORMAL LOW (ref 39.0–52.0)
Hemoglobin: 12.9 g/dL — ABNORMAL LOW (ref 13.0–17.0)
Potassium: 3.3 mmol/L — ABNORMAL LOW (ref 3.5–5.1)
Sodium: 140 mmol/L (ref 135–145)
TCO2: 28 mmol/L (ref 22–32)

## 2022-06-06 LAB — PROTIME-INR
INR: 1.1 (ref 0.8–1.2)
Prothrombin Time: 14.1 seconds (ref 11.4–15.2)

## 2022-06-06 LAB — CBC
HCT: 37.2 % — ABNORMAL LOW (ref 39.0–52.0)
Hemoglobin: 12.6 g/dL — ABNORMAL LOW (ref 13.0–17.0)
MCH: 31.3 pg (ref 26.0–34.0)
MCHC: 33.9 g/dL (ref 30.0–36.0)
MCV: 92.3 fL (ref 80.0–100.0)
Platelets: 123 10*3/uL — ABNORMAL LOW (ref 150–400)
RBC: 4.03 MIL/uL — ABNORMAL LOW (ref 4.22–5.81)
RDW: 14.3 % (ref 11.5–15.5)
WBC: 5.6 10*3/uL (ref 4.0–10.5)
nRBC: 0 % (ref 0.0–0.2)

## 2022-06-06 LAB — CBG MONITORING, ED: Glucose-Capillary: 128 mg/dL — ABNORMAL HIGH (ref 70–99)

## 2022-06-06 LAB — DIFFERENTIAL
Abs Immature Granulocytes: 0.02 10*3/uL (ref 0.00–0.07)
Basophils Absolute: 0 10*3/uL (ref 0.0–0.1)
Basophils Relative: 0 %
Eosinophils Absolute: 0.1 10*3/uL (ref 0.0–0.5)
Eosinophils Relative: 1 %
Immature Granulocytes: 0 %
Lymphocytes Relative: 24 %
Lymphs Abs: 1.4 10*3/uL (ref 0.7–4.0)
Monocytes Absolute: 0.3 10*3/uL (ref 0.1–1.0)
Monocytes Relative: 6 %
Neutro Abs: 3.8 10*3/uL (ref 1.7–7.7)
Neutrophils Relative %: 69 %

## 2022-06-06 LAB — APTT: aPTT: 29 seconds (ref 24–36)

## 2022-06-06 LAB — ETHANOL: Alcohol, Ethyl (B): <10 mg/dL (ref <10)

## 2022-06-06 MED ORDER — SODIUM CHLORIDE 0.9% FLUSH
3.0000 mL | Freq: Once | INTRAVENOUS | Status: AC
  Administered 2022-06-06: 3 mL via INTRAVENOUS

## 2022-06-06 NOTE — Code Documentation (Signed)
Stroke Response Nurse Documentation Code Documentation  Brendan [name] is a 82 y.o. male arriving to [facility]  via [facility] on 06/06/22 with past medical hx of CAD, HLD, HTN, CABG, DBS. On aspirin 81 mg daily. Code stroke was activated by EMS.   Patient from home receiving hospice care when he became agitated and increased AMS. Family and hospice staff unable to calm him or get him to take medications. Per family, patient had right facial droop as well.   Stroke team at the bedside on patient arrival. Labs drawn and patient cleared for CT by Dr. Sherry Ruffing. Patient to CT with team. NIHSS 4, see documentation for details and code stroke times. Patient with disoriented, right facial droop, and dysarthria  on exam.   The following imaging was completed:  CT Head. Patient is not a candidate for IV Thrombolytic due to comfort care status. Patient is not a candidate for IR due to comfort care status.   Care Plan: cancel code stroke per Lyn Records MD.   Bedside handoff with ED RN Herbert Spires.    Candace Cruise K  Stroke Response RN

## 2022-06-06 NOTE — H&P (Shared)
Hospital Admission History and Physical Service Pager: [phone]  Patient name: Brendan [name] Medical record number: [id number] Date of Birth: Aug 19, 1940 Age: 82 y.o. Gender: male  Primary Care Provider: Precious Gilding, DO Consultants: Palliative Code Status: DNR, confirmed with DNR paperwork at bedside and with Daughter Preferred Emergency Contact: Spouse Contact Information     Name Relation Home Work [place] [phone]  [phone]   Moon,[name] Daughter [phone]  [phone]      Chief Complaint: Patient  Assessment and Plan: Brendan [name] is a 82 y.o. male presenting with agitation.  Past medical history includes dementia, CAD s/p CABG, hypertension, hyperlipidemia, ETD s/p bilateral deep brain stimulator, stroke, peripheral neuropathy.   Leading differentials include: Agitation secondary to underlying dementia, toxic encephalopathy secondary to sedative hypnotics and antipsychotic medication (which she receives as part of his comfort care).  Additional differentials considered but less likely: CVA (no focal neurologic deficits, patient has baseline, CT head negative for acute intracranial pathology), seizure (no witnessed seizure activity, no postictal state), infectious etiology (afebrile, does not meet any SIRS criteria, is currently being treated for Salmonella UTI-UA without signs of infection), unlikely to be hypoxia as patient is satting well and unlikely to be related to hypo-/hyperglycemia as patient has normal blood glucose, head trauma (CT head negative, no known trauma per family).  Patient is comfort care on home hospice.  Patient's daughter, who has power of attorney, states there is no change in patient's comfort care plan.  They do not wish to pursue further workup.  They wish patient to be stabilized with agitation and are seeking placement.   * Agitation Agitation currently resolved on admission.  Patient is comfort care with home  hospice.  Family does not desire further workup, however desire agitation to be stabilized.  Family appears agreeable to placement during this hospitalization-due to violent behavior patient does not have safe disposition home, where his wife's primary caregiver.  Patient appears at his baseline based on interviewer's prior experience with patient and chart review.  Due to code stroke-which was discontinued per family wishes-will obtain SLP eval prior to diet.  I believe patient will benefit from palliative consult to further discuss care goals, with hopes of preventing more admissions for agitated behavior.  On last admission, patient was a difficult placement due to financial barriers-will consult TOC for assistance.  We will continue comfort care measures. - Admit to FMTS, med-surg, attending Dr. Gwendlyn Deutscher - Palliative consult, appreciate recs - SLP eval, appreciate recs - Comfort care measures - Vitals per floor   FEN/GI: *** VTE Prophylaxis: ***  Disposition: Med-surg  History of Present Illness:  Brendan [name] is a 82 y.o. male presenting with ***  HPI is primarily obtained from daughter.  Currently receives home hospice. On Friday night had episode of agitation, home hospice was able to provide medications and patient improved. On Monday, slightly agitated. Today, became agitated- would not take his medication, including antibiotics for his UTI. Swatting at family. Family unable to care for him due to violence and paranoia. Hospice came to visit, patient did take 1 ativan which did not improve agitation, and they recommended transfer to ED. Daughter reports patient is more calm with men.   Patient remains comfort care and hospice. DNR paper with patient, confirmed with patient. Family did not want to pursue further stroke work-up.  In the ED, code stroke was called. CT head without evidence of acute intracranial pathology. Patient is CC on  Hospice, and decision by family to d/c further  work-up was made. Patient's agitation subsided without intervention. Family medicine was paged for admission.  Review Of Systems: Per HPI with the following additions: ***  Pertinent Past Medical History: *** Remainder reviewed in history tab.   Pertinent Past Surgical History: ***  Remainder reviewed in history tab.  Pertinent Social History: Tobacco use: Yes/No/Former Alcohol use: *** Other Substance use: *** Lives with ***  Pertinent Family History: ***  Remainder reviewed in history tab.   Important Outpatient Medications:   Remainder reviewed in medication history.   Objective: BP (!) 190/97 (BP Location: Left Arm)   Pulse 100   Temp 98.1 F (36.7 C) (Oral)   Resp 15   Wt 70.8 kg   SpO2 96%   BMI 23.73 kg/m  Exam: General: *** Eyes: *** ENTM: *** Neck: *** Cardiovascular: *** Respiratory: *** Gastrointestinal: *** MSK: *** Derm: *** Neuro: *** Psych: ***  Labs:  CBC BMET  Recent Labs  Lab 06/06/22 1615 06/06/22 1620  WBC 5.6  --   HGB 12.6* 12.9*  HCT 37.2* 38.0*  PLT 123*  --    Recent Labs  Lab 06/06/22 1615 06/06/22 1620  NA 137 140  K 3.2* 3.3*  CL 98 98  CO2 28  --   BUN 9 9  CREATININE 1.00 0.90  GLUCOSE 138* 133*  CALCIUM 9.0  --     UA: Not concerning for infection (patient currently already being treated for salmonella UTI-6 weeks) Ethanol: Negative INR: 1.1 APTT: 29 seconds  EKG: Sinus rhythm, no STEMI   Imaging Studies Performed: CT HEAD CODE STROKE WO CONTRAST Result Date: 06/06/2022 IMPRESSION: Stable noncontrast head CT with no acute intracranial pathology. Findings communicated to Dr. Curly Shores via Shea Evans at 4:29pm.  [name], DO 06/06/2022, 11:47 PM PGY-1, [place] Intern pager: [phone], text pages welcome Secure chat group [place]

## 2022-06-06 NOTE — ED Triage Notes (Signed)
Pt BIB EMS due to code stroke. Pt was more agitated, having delusions, and not taking medicine so care giver called EMS. Pt is hospice pt. EMS states pt was having right sided weakness right sided facial droop. EMS also states pt had possible STEMI.

## 2022-06-06 NOTE — Assessment & Plan Note (Addendum)
Patient resting peacefully all day yesterday with q3h physician checks and calm upon exam today. No indication for adding back Ativan or Morphine but will continue to reassess as needed. Pending possible placement at [facility]. No recorded PO intake since admission. -Palliative consult, appreciate recs -Comfort care measures

## 2022-06-06 NOTE — ED Provider Notes (Signed)
[facility] Provider Note   CSN: [id number] Arrival date & time: 06/06/22  1612     History  No chief complaint on file.   Brendan [name] is a 82 y.o. male.  The history is provided by the patient, medical records and a relative. The history is limited by the condition of the patient. No language interpreter was used.  Altered Mental Status Presenting symptoms: behavior changes, combativeness and confusion   Severity:  Severe Most recent episode:  More than 2 days ago Episode history:  Multiple Timing:  Intermittent Progression:  Waxing and waning Chronicity:  New Associated symptoms: agitation (er family), hallucinations, slurred speech and weakness (r face intermitnetly)   Associated symptoms: no abdominal pain, no fever, no headaches, no light-headedness, no nausea, no palpitations, no rash, no visual change and no vomiting        Home Medications Prior to Admission medications   Medication Sig Start Date End Date Taking? Authorizing Provider  albuterol (VENTOLIN HFA) 108 (90 Base) MCG/ACT inhaler INHALE 2 PUFFS INTO THE LUNGS EVERY 4 HOURS AS NEEDED FOR WHEEZING OR SHORTNESS OF BREATH 05/30/22   Precious Gilding, DO  amoxicillin (AMOXIL) 500 MG capsule Take 2 capsules (1,000 mg total) by mouth every 8 (eight) hours for 110 doses. 05/20/22 06/26/22  Gerrit Heck, MD  bisacodyl (DULCOLAX) 5 MG EC tablet Take 1 tablet (5 mg total) by mouth daily as needed for moderate constipation. 05/20/22   Gerrit Heck, MD  fluticasone (FLONASE) 50 MCG/ACT nasal spray Place 1 spray into both nostrils daily as needed for allergies. 04/21/21   Autry-Lott, Naaman Plummer, DO  glycopyrrolate (ROBINUL) 1 MG tablet Take 1 tablet (1 mg total) by mouth every 4 (four) hours as needed (excessive secretions). 05/20/22   Gerrit Heck, MD  haloperidol (HALDOL) 1 MG tablet Take 1 tablet (1 mg total) by mouth every 4 (four) hours as needed for agitation (or delirium).  05/20/22   Gerrit Heck, MD  haloperidol (HALDOL) 2 MG/ML solution Place 0.5 mLs (1 mg total) under the tongue every 4 (four) hours as needed for agitation (or delirium). 05/20/22   Gerrit Heck, MD  Hyprom-Naphaz-Polysorb-Zn Sulf (CLEAR EYES COMPLETE OP) Place 1 drop into both eyes daily as needed (dry eyes).    [provider]  loratadine (ALLERGY RELIEF) 10 MG tablet Take 1 tablet (10 mg total) by mouth daily. 05/02/22   Precious Gilding, DO  Melatonin 3 MG TABS Take 1 tablet (3 mg total) by mouth at bedtime. Patient taking differently: Take 5 mg by mouth at bedtime. 06/08/17   Angiulli, Lavon Paganini, PA-C  Morphine Sulfate (MORPHINE CONCENTRATE) 10 MG/0.5ML SOLN concentrated solution Take 0.25 mLs (5 mg total) by mouth every 2 (two) hours as needed for moderate pain (or dyspnea). 05/20/22 06/19/22  Gerrit Heck, MD  Morphine Sulfate (MORPHINE CONCENTRATE) 10 MG/0.5ML SOLN concentrated solution Place 0.25 mLs (5 mg total) under the tongue every 2 (two) hours as needed for moderate pain (or dyspnea). 05/20/22   Gerrit Heck, MD  nitroGLYCERIN (NITROSTAT) 0.4 MG SL tablet Place 1 tablet (0.4 mg total) under the tongue every 5 (five) minutes as needed for chest pain. call 911 if chest pain not better 05/20/22   Gerrit Heck, MD  nystatin cream (MYCOSTATIN) APPLY TOPICALLY TO RASH FOUR TIMES DAILYFOR TWO WEEKS Patient taking differently: Apply 1 Application topically at bedtime as needed (Rash). 06/10/21   Autry-Lott, Naaman Plummer, DO  omeprazole (PRILOSEC) 20 MG capsule TAKE 1 CAPSULE BY  MOUTH DAILY AS NEEDED FOR ACID REFLUX Patient taking differently: Take 20 mg by mouth daily as needed (acid reflux). 02/02/22   Eppie Gibson, MD  ondansetron (ZOFRAN-ODT) 4 MG disintegrating tablet Take 1 tablet (4 mg total) by mouth every 6 (six) hours as needed for nausea. 05/20/22   Gerrit Heck, MD  polyethylene glycol (MIRALAX / GLYCOLAX) 17 g packet Take 17 g by mouth daily as needed for mild constipation.  05/20/22   Gerrit Heck, MD  tamsulosin (FLOMAX) 0.4 MG CAPS capsule Take 1 capsule (0.4 mg total) by mouth daily. 05/21/22   Gerrit Heck, MD  traZODone (DESYREL) 100 MG tablet TAKE 1 TABLET BY MOUTH EACH NIGHT AT BEDTIME Patient taking differently: Take 100 mg by mouth at bedtime. 03/24/22   Precious Gilding, DO      Allergies    Lisinopril    Review of Systems   Review of Systems  Constitutional:  Negative for chills, fatigue and fever.  HENT:  Negative for congestion.   Eyes:  Negative for visual disturbance.  Respiratory:  Negative for cough, chest tightness, shortness of breath and wheezing.   Cardiovascular:  Negative for chest pain and palpitations.  Gastrointestinal:  Negative for abdominal pain, constipation, diarrhea, nausea and vomiting.  Genitourinary:  Positive for frequency.  Musculoskeletal:  Negative for back pain.  Skin:  Negative for rash.  Neurological:  Positive for speech difficulty and weakness (r face intermitnetly). Negative for light-headedness, numbness and headaches.  Psychiatric/Behavioral:  Positive for agitation (er family), confusion and hallucinations.   All other systems reviewed and are negative.   Physical Exam Updated Vital Signs BP (!) 190/97 (BP Location: Left Arm)   Pulse 100   Temp 98.1 F (36.7 C) (Oral)   Resp 15   Wt 70.8 kg   SpO2 96%   BMI 23.73 kg/m  Physical Exam Constitutional:      General: He is not in acute distress.    Appearance: He is well-developed. He is not ill-appearing, toxic-appearing or diaphoretic.  HENT:     Head: Normocephalic and atraumatic.     Right Ear: External ear normal.     Left Ear: External ear normal.     Nose: Nose normal. No congestion or rhinorrhea.     Mouth/Throat:     Mouth: Mucous membranes are moist.     Pharynx: No oropharyngeal exudate or posterior oropharyngeal erythema.  Eyes:     Extraocular Movements: Extraocular movements intact.     Conjunctiva/sclera: Conjunctivae normal.      Pupils: Pupils are equal, round, and reactive to light.  Cardiovascular:     Rate and Rhythm: Normal rate.     Heart sounds: No murmur heard. Pulmonary:     Effort: Pulmonary effort is normal. No respiratory distress.     Breath sounds: No stridor. No wheezing, rhonchi or rales.  Chest:     Chest wall: No tenderness.  Abdominal:     Palpations: Abdomen is soft.     Tenderness: There is no abdominal tenderness. There is no guarding or rebound.  Musculoskeletal:        General: No tenderness.     Cervical back: Normal range of motion and neck supple. No tenderness.  Skin:    General: Skin is warm.     Coloration: Skin is not pale.     Findings: No erythema or rash.  Neurological:     Mental Status: He is alert.     Cranial Nerves: No cranial nerve deficit  or dysarthria.     Sensory: No sensory deficit.     Motor: No weakness or abnormal muscle tone.     Comments: Some intermittent expressive aphasia.     ED Results / Procedures / Treatments   Labs (all labs ordered are listed, but only abnormal results are displayed) Labs Reviewed  CBC - Abnormal; Notable for the following components:      Result Value   RBC 4.03 (*)    Hemoglobin 12.6 (*)    HCT 37.2 (*)    Platelets 123 (*)    All other components within normal limits  COMPREHENSIVE METABOLIC PANEL - Abnormal; Notable for the following components:   Potassium 3.2 (*)    Glucose, Bld 138 (*)    Total Protein 6.4 (*)    Total Bilirubin 1.3 (*)    All other components within normal limits  URINALYSIS, ROUTINE W REFLEX MICROSCOPIC - Abnormal; Notable for the following components:   APPearance HAZY (*)    Hgb urine dipstick SMALL (*)    Protein, ur 100 (*)    Bacteria, UA RARE (*)    All other components within normal limits  I-STAT CHEM 8, ED - Abnormal; Notable for the following components:   Potassium 3.3 (*)    Glucose, Bld 133 (*)    Hemoglobin 12.9 (*)    HCT 38.0 (*)    All other components within normal  limits  CBG MONITORING, ED - Abnormal; Notable for the following components:   Glucose-Capillary 128 (*)    All other components within normal limits  URINE CULTURE  PROTIME-INR  APTT  DIFFERENTIAL  ETHANOL    EKG None  Radiology CT HEAD CODE STROKE WO CONTRAST  Result Date: 06/06/2022 CLINICAL DATA:  Code stroke.  Right facial droop and slurred speech. EXAM: CT HEAD WITHOUT CONTRAST TECHNIQUE: Contiguous axial images were obtained from the base of the skull through the vertex without intravenous contrast. RADIATION DOSE REDUCTION: This exam was performed according to the departmental dose-optimization program which includes automated exposure control, adjustment of the mA and/or kV according to patient size and/or use of iterative reconstruction technique. COMPARISON:  CT head 05/15/2022 FINDINGS: Brain: There is no evidence of acute intracranial hemorrhage, extra-axial fluid collection, or acute infarct Deep brain stimulator leads terminating in the region of the thalami are unchanged. Parenchymal volume loss with prominence of the ventricular system and extra-axial CSF spaces is unchanged. The ventricles are stable in size. Patchy hypodensity in the supratentorial white matter likely reflecting sequela of chronic small-vessel ischemic change is stable. The pituitary and suprasellar region are normal. There is no mass lesion. There is no mass effect or midline shift. Vascular: No dense vessel is seen. There is calcification of the bilateral carotid siphons and vertebral arteries. Skull: Choose Sinuses/Orbits: The imaged paranasal sinuses are clear. Bilateral lens implants are in place. The globes and orbits are otherwise unremarkable. Other: None. ASPECTS ([place] Stroke Program Early CT Score) - Ganglionic level infarction (caudate, lentiform nuclei, internal capsule, insula, M1-M3 cortex): 7 - Supraganglionic infarction (M4-M6 cortex): 3 Total score (0-10 with 10 being normal): 10 IMPRESSION:  Stable noncontrast head CT with no acute intracranial pathology. Findings communicated to Dr. Curly Shores via Shea Evans at 4:29pm. Electronically Signed   By: Valetta Mole M.D.   On: 06/06/2022 16:30    Procedures Procedures    Medications Ordered in ED Medications  sodium chloride flush (NS) 0.9 % injection 3 mL (3 mLs Intravenous Given 06/06/22 1638)  ED Course/ Medical Decision Making/ A&P                             Medical Decision Making Amount and/or Complexity of Data Reviewed Labs: ordered. Radiology: ordered.  Risk Decision regarding hospitalization.    DAVON BOWE is a 82 y.o. male with a past medical history significant for hypertension, CAD status post CABG, hyperlipidemia, essential tremor status post brain stimulators, GERD, dementia, diabetes, previous intracerebral hemorrhage and cerebral edema, and essential tremor who presents as a code stroke for altered mental status, speech difficulty, and facial droop.  According to EMS report, patient started having difficult with speech and right facial droop today and was agitated and altered.  Patient says he is feeling back to his baseline aside from some difficulty with speech and he also reports that he has some urinary frequency new.  He denies fevers, chills, congestion, cough.  Denies nausea or vomiting.  Denies any trauma.  Denies any headache or neck pain.  Airway was clear on arrival.  Patient taken to [facility].  CT head appears similar to prior and unchanged with no bleeding.  Neurology initially said there did not appear to be stroke and was signing off given his hospice status and being DNR/DNI.  As patient is still having some difficulty with speech on my exam, will confirm a plan with them.  Will check urinalysis and other labs.  As patient seems to be at his baseline aside from some difficulty with speech, anticipate he may be stable for discharge home if workup is reassuring.  Anticipate discussion with  neurology to determine disposition.  Spoke again with neurology that do not feel patient needs MRI as they do not feel any management would likely change if it did show a small stroke.  They agree with getting workup to rule out UTI given his reported frequency and the reported transient altered mental status.  They feel that if workup is reassuring he is likely safe for discharge home.  Will await urinalysis and reassessment.  7:01 PM Family arrived and was able provide further information.  They report that patient has been on several weeks of antibiotics for UTI already.  They are concerned that her last few days he has had worsening mental status changes with aggression and fighting.  They said he has been hallucinating and agitated.  They are concerned he is acutely delirious intermittently.  As the patient is reporting urinary symptoms, we will try to get in and out catheterization or get a urine on him.  Family reports that the hospice team came out today when he was acutely agitated and they did not feel he or they were safe having him at home.  They feel need to come in for admission or further management of this acute delirium.  As neurology did not find evidence of acute stroke nor with a get MRI, we will look for UTI.  Either way, do not feel patient is safe for discharge home based on the report from family.  Anticipate admission after workup is completed.  9:28 PM Patient's urinalysis returned and does not show convincing evidence of worsening UTI with no nitrites or leukocytes.  A culture will be added and there is rare bacteria.  Otherwise, I do not appear to see a source for this worsened agitation and mental status changes with acute delirium per family.  Unclear if this is due to polypharmacy but patient  is having the intermittent delirium which is new.  Given the concern with him being violent with family at home and the hospice team saying he is not safe to be at home, we will  call medicine for admission for intermittent delirium that appears to be new and worsening.  If medicine will not admit, anticipate boarding with PT to see to determine disposition and level of care needs.         Final Clinical Impression(s) / ED Diagnoses Final diagnoses:  Delirious     Clinical Impression: 1. Delirious     Disposition: Admit  This note was prepared with assistance of Dragon voice recognition software. Occasional wrong-word or sound-a-like substitutions may have occurred due to the inherent limitations of voice recognition software.     Dinorah Masullo, Gwenyth Allegra, MD 06/06/22 [phone]

## 2022-06-06 NOTE — Consult Note (Signed)
Neurology Consultation  Reason for Consult: Code Stroke Referring Physician: Dr. Gustavus Messing  CC: Patient is without complaint on arrival. Family concerned for agitation and combativeness at home with right facial droop.   History is obtained from: Daughter via telephone, chart review, unable to obtain from patient due to baseline cognitive status  HPI: Brendan [name] is a 82 y.o. male currently on home hospice for subacute cognitive decline with poor quality of life with a medical history significant for CAD s/p CABG x 3 in 2004, coronary atherosclerosis of the artery bypass graft, cervical spinal stenosis, hyperlipidemia, hypertension, peripheral neuropathy, and essential tremor s/p DBS and spinal stimulator who presented to the ED from home on 06/06/2022 for evaluation of agitation, combativeness, altered mental status with hallucinations, and right facial droop with drooling and dysarthria. Patient was at home outside in his wheelchair when family states they attempted to bring him inside. The patient refused and became combative. Family contacted the hospice nurses for assistance but they could not get him to take medication to assist with combativeness. Family states that during this event the patient had right facial drooping with drooling and dysarthria and at one point he clutched his head and began to beat on his chest. EMS was activated for symptom control.  Of note, the patient is on hospice due to concerns for impaired quality of life and cognitive decline over the past year and more recently over the past month without identifiable reversible cause despite multiple work-ups. Family notes that they are unable to care for him in the home independently and have recently transitioned to home hospice. Family states that they wish for no aggressive measures to be taken in the event of a CVA or MI and wish to only provide comfort care measures at this time. Patient was brought to the ED mainly for  symptom management of significant agitation with combativeness. Patient is calm and cooperative on ED arrival.   LKW: 11:45 TNK given?: no, per family, no aggressive measures. The family wishes for only comfort care measures to be initiated during hospital evaluation within the patient's goals of care.  IR Thrombectomy? No, as above. Patient's presentation is also not consistent with LVO Modified Rankin Scale: 5-Severe disability-bedridden, incontinent, needs constant attention patient is on home hospice  ROS: Unable to obtain due to altered mental status.   Past Medical History:  Diagnosis Date   Anxiety disorder    Benign enlargement of prostate    CAD in native artery 1997   Referred for CABG x3 in 2004 for LAD diagonal bifurcation lesion   Cervical spinal stenosis    Complex regional pain syndrome of right upper extremity    Right wrist; L arm   Coronary atherosclerosis of artery bypass graft 09/2011   Occluded SVG-D1; Cardiologist Dr. Ellyn Hack   Gait disorder    Gastroesophageal reflux disease    Glaucoma    Hiatal hernia    GI: Dr Penelope Coop   Hyperlipidemia LDL goal <70    Hypertension    Memory loss    Mild   Obesity    Peripheral neuropathy    possible peripheral neuropathy   Pre-diabetes    S/P CABG x 3 2004    LIMA-LAD, SVG to diagonal, SVG to OM   Tremor, essential    On Primidone   Unstable angina pectoris ([place]) 09/2011   Cardiac cath: occluded SVG-DI. Patent LIMA-LAD and SVG-OM. EF 45% with apical inferior HK.   Past Surgical History:  Procedure Laterality Date  ANTERIOR CERVICAL DECOMP/DISCECTOMY FUSION N/A 11/30/2020   Procedure: Cervical three-four, Cervical four-fve  Anterior cervical decompression/discectomy/fusion;  Surgeon: Erline Levine, MD;  Location: [facility];  Service: Neurosurgery;  Laterality: N/A;   CATARACT EXTRACTION, BILATERAL     CORONARY ARTERY BYPASS GRAFT  06/2002   LIMA-LAD, SVG-D1, SVG-OM   EYE SURGERY     KNEE SURGERY Left    LEFT HEART  CATH AND CORONARY ANGIOGRAPHY  1997; 2004   In 2004 was referred for CABG due to a LAD-bifurcation lesion   LEFT HEART CATHETERIZATION WITH CORONARY/GRAFT ANGIOGRAM N/A 10/13/2011   Procedure: LEFT HEART CATHETERIZATION WITH Beatrix Fetters;  Surgeon: Leonie Man, MD;  Location: [facility] CATH LAB;;  occluded SVG-DI. Patent LIMA-LAD and SVG-OM. EF 45% with apical inferior HK.;   MINOR PLACEMENT OF FIDUCIAL N/A 04/26/2017   Procedure: Fiducial placement;  Surgeon: Erline Levine, MD;  Location: [facility];  Service: Neurosurgery;  Laterality: N/A;  Fiducial placement   NM MYOCAR PERF WALL MOTION  03/23/2009   protocol:[place], normal perfusion in all regions, post-stress EF 72%, exercise capacity 7METS, EKG negative for ischemia.   PULSE GENERATOR IMPLANT Bilateral 11/13/2017   Procedure: Bilateral Implantable pulse generator;  Surgeon: Erline Levine, MD;  Location: [facility];  Service: Neurosurgery;  Laterality: Bilateral;  Bilateral Implantable pulse generator   Shoulder Orthoscopic Surgery   06/2003   SPINAL CORD STIMULATOR BATTERY EXCHANGE Bilateral 02/16/2022   Procedure: Bilateral change IPG battery;  Surgeon: Dawley, Theodoro Doing, DO;  Location: [facility];  Service: Neurosurgery;  Laterality: Bilateral;  RM 20 to follow 3C   SUBTHALAMIC STIMULATOR INSERTION Bilateral 05/04/2017   Procedure: Bilateral Deep brain stimulator placement;  Surgeon: Erline Levine, MD;  Location: [facility];  Service: Neurosurgery;  Laterality: Bilateral;  Bilateral deep brain stimulator placement   TRANSTHORACIC ECHOCARDIOGRAM  04/2017   Jan 2019: Normal LV size and function.  EF 55-60%.  No or W MA.  Mild LA dilation.   wrist ganglion cyst Left    Family History  Problem Relation Age of Onset   Heart disease Mother    Heart disease Brother    Tremor Paternal Uncle    Heart disease Brother    Drug abuse Daughter    Hepatitis C Daughter    Social History:   reports that he quit smoking about 54 years ago. His smoking use included  cigarettes. He has a 60.00 pack-year smoking history. He has never used smokeless tobacco. He reports that he does not drink alcohol and does not use drugs.  Medications  Current Facility-Administered Medications:    sodium chloride flush (NS) 0.9 % injection 3 mL, 3 mL, Intravenous, Once, Tegeler, Gwenyth Allegra, MD  Current Outpatient Medications:    albuterol (VENTOLIN HFA) 108 (90 Base) MCG/ACT inhaler, INHALE 2 PUFFS INTO THE LUNGS EVERY 4 HOURS AS NEEDED FOR WHEEZING OR SHORTNESS OF BREATH, Disp: 6.7 g, Rfl: 0   amoxicillin (AMOXIL) 500 MG capsule, Take 2 capsules (1,000 mg total) by mouth every 8 (eight) hours for 110 doses., Disp: 220 capsule, Rfl: 0   bisacodyl (DULCOLAX) 5 MG EC tablet, Take 1 tablet (5 mg total) by mouth daily as needed for moderate constipation., Disp: 30 tablet, Rfl: 0   fluticasone (FLONASE) 50 MCG/ACT nasal spray, Place 1 spray into both nostrils daily as needed for allergies., Disp: 16 g, Rfl: 1   glycopyrrolate (ROBINUL) 1 MG tablet, Take 1 tablet (1 mg total) by mouth every 4 (four) hours as needed (excessive secretions)., Disp: 30 tablet,  Rfl: 0   haloperidol (HALDOL) 1 MG tablet, Take 1 tablet (1 mg total) by mouth every 4 (four) hours as needed for agitation (or delirium)., Disp: 30 tablet, Rfl: 0   haloperidol (HALDOL) 2 MG/ML solution, Place 0.5 mLs (1 mg total) under the tongue every 4 (four) hours as needed for agitation (or delirium)., Disp: 120 mL, Rfl: 0   Hyprom-Naphaz-Polysorb-Zn Sulf (CLEAR EYES COMPLETE OP), Place 1 drop into both eyes daily as needed (dry eyes)., Disp: , Rfl:    loratadine (ALLERGY RELIEF) 10 MG tablet, Take 1 tablet (10 mg total) by mouth daily., Disp: 30 tablet, Rfl: 2   Melatonin 3 MG TABS, Take 1 tablet (3 mg total) by mouth at bedtime. (Patient taking differently: Take 5 mg by mouth at bedtime.), Disp: 30 tablet, Rfl: 0   Morphine Sulfate (MORPHINE CONCENTRATE) 10 MG/0.5ML SOLN concentrated solution, Take 0.25 mLs (5 mg total)  by mouth every 2 (two) hours as needed for moderate pain (or dyspnea)., Disp: 30 mL, Rfl: 0   Morphine Sulfate (MORPHINE CONCENTRATE) 10 MG/0.5ML SOLN concentrated solution, Place 0.25 mLs (5 mg total) under the tongue every 2 (two) hours as needed for moderate pain (or dyspnea)., Disp: 30 mL, Rfl: 0   nitroGLYCERIN (NITROSTAT) 0.4 MG SL tablet, Place 1 tablet (0.4 mg total) under the tongue every 5 (five) minutes as needed for chest pain. call 911 if chest pain not better, Disp: 30 tablet, Rfl: 3   nystatin cream (MYCOSTATIN), APPLY TOPICALLY TO RASH FOUR TIMES DAILYFOR TWO WEEKS (Patient taking differently: Apply 1 Application topically at bedtime as needed (Rash).), Disp: 30 g, Rfl: 0   omeprazole (PRILOSEC) 20 MG capsule, TAKE 1 CAPSULE BY MOUTH DAILY AS NEEDED FOR ACID REFLUX (Patient taking differently: Take 20 mg by mouth daily as needed (acid reflux).), Disp: 90 capsule, Rfl: 0   ondansetron (ZOFRAN-ODT) 4 MG disintegrating tablet, Take 1 tablet (4 mg total) by mouth every 6 (six) hours as needed for nausea., Disp: 20 tablet, Rfl: 0   polyethylene glycol (MIRALAX / GLYCOLAX) 17 g packet, Take 17 g by mouth daily as needed for mild constipation., Disp: 14 each, Rfl: 0   tamsulosin (FLOMAX) 0.4 MG CAPS capsule, Take 1 capsule (0.4 mg total) by mouth daily., Disp: 30 capsule, Rfl: 0   traZODone (DESYREL) 100 MG tablet, TAKE 1 TABLET BY MOUTH EACH NIGHT AT BEDTIME (Patient taking differently: Take 100 mg by mouth at bedtime.), Disp: 30 tablet, Rfl: 0  Facility-Administered Medications Ordered in Other Encounters:    0.9 %  sodium chloride infusion, , Intravenous, Continuous, Leonie Man, MD   sodium chloride 0.9 % injection 3 mL, 3 mL, Intravenous, PRN, Leonie Man, MD  Exam: Current vital signs: Wt 70.8 kg   BMI 23.73 kg/m  Vital signs in last 24 hours: Weight:  [70.8 kg] 70.8 kg (02/20 1600)  GENERAL: Awake, alert, in no acute distress Psych: Affect appropriate for situation,  patient is calm and cooperative with examination Head: Normocephalic and atraumatic, without obvious abnormality EENT: Normal conjunctivae, dry mucous membranes, no OP obstruction LUNGS: Normal respiratory effort. Non-labored breathing on room air CV: Regular rate and rhythm on telemetry ABDOMEN: Soft, non-tender, non-distended Extremities: warm, well perfused, without obvious deformity  NEURO:  Full neurological evaluation deferred given goals of care  NIHSS: 1a Level of Conscious.: 0 1b LOC Questions: 2 (baseline cognitive status) 1c LOC Commands: 0 2 Best Gaze: 0 3 Visual: 0 4 Facial Palsy: 1 5a Motor Arm -  left: 0 5b Motor Arm - Right: 0 6a Motor Leg - Left: 0 6b Motor Leg - Right: 0 7 Limb Ataxia: 0 8 Sensory: 0 9 Best Language: 0 10 Dysarthria: 1 11 Extinct. and Inatten.: 0 TOTAL: 4  Labs I have reviewed labs in epic and the results pertinent to this consultation are: CBC    Component Value Date/Time   WBC 6.5 05/17/2022 0242   RBC 3.55 (L) 05/17/2022 0242   HGB 12.9 (L) 06/06/2022 1620   HGB 10.9 (L) 08/29/2017 1547   HCT 38.0 (L) 06/06/2022 1620   HCT 34.7 (L) 08/29/2017 1547   PLT 128 (L) 05/17/2022 0242   PLT 128 (L) 08/29/2017 1547   MCV 89.6 05/17/2022 0242   MCV 95 08/29/2017 1547   MCH 31.0 05/17/2022 0242   MCHC 34.6 05/17/2022 0242   RDW 14.4 05/17/2022 0242   RDW 14.6 08/29/2017 1547   LYMPHSABS 1.6 05/15/2022 1705   LYMPHSABS 1.4 08/29/2017 1547   MONOABS 0.3 05/15/2022 1705   EOSABS 0.1 05/15/2022 1705   EOSABS 0.2 08/29/2017 1547   BASOSABS 0.0 05/15/2022 1705   BASOSABS 0.0 08/29/2017 1547    CMP     Component Value Date/Time   NA 140 06/06/2022 1620   NA 139 01/04/2022 1237   K 3.3 (L) 06/06/2022 1620   CL 98 06/06/2022 1620   CO2 27 05/17/2022 0242   GLUCOSE 133 (H) 06/06/2022 1620   BUN 9 06/06/2022 1620   BUN 11 01/04/2022 1237   CREATININE 0.90 06/06/2022 1620   CREATININE 1.02 05/01/2016 1118   CALCIUM 8.8 (L) 05/17/2022  0242   PROT 7.3 05/15/2022 1705   PROT 6.7 01/04/2022 1237   ALBUMIN 3.7 05/15/2022 1705   ALBUMIN 4.2 01/04/2022 1237   AST 17 05/15/2022 1705   ALT 11 05/15/2022 1705   ALKPHOS 99 05/15/2022 1705   BILITOT 2.0 (H) 05/15/2022 1705   BILITOT 1.5 (H) 01/04/2022 1237   GFRNONAA >60 05/17/2022 0242   GFRNONAA 73 05/01/2016 1118   GFRAA 76 02/04/2019 1534   GFRAA 84 05/01/2016 1118   Lipid Panel     Component Value Date/Time   CHOL 120 06/30/2021 1143   TRIG 77 06/30/2021 1143   HDL 38 (L) 06/30/2021 1143   CHOLHDL 3.2 06/30/2021 1143   CHOLHDL 4.0 05/16/2017 0713   VLDL 13 05/16/2017 0713   LDLCALC 66 06/30/2021 1143   LDLDIRECT 84 08/30/2006 2144   Imaging I have reviewed the images obtained:  CT-scan of the brain 06/06/22: Stable noncontrast head CT with no acute intracranial pathology.   Assessment: 82 year old male on home hospice for cognitive decline with PMHx of CAD s/p CABG x 3 in 2004, coronary atherosclerosis of the artery bypass graft, cervical spinal stenosis, HLD, HTN, peripheral neuropathy, and essential tremor s/p DBS and spinal stimulator who presented to the ED from home on 06/06/2022 for evaluation of agitation, combativeness, altered mental status with hallucinations, and right facial droop with drooling and dysarthria.  Family was unable to get patient to calm down and hospice staff was unable to administer medication to help patient combativeness so EMS was activated.  Due to EMS concern for right-sided facial weakness and dysarthria, code stroke was activated.  Upon further discussion with patient's daughter via telephone, family stated they would like comfort measures only for Brendan [name] and would not like to pursue further diagnostic testing or aggressive treatment measures.  A code stroke was canceled at this time. -Examination reveals patient that is  cooperative with exam, disoriented to time, place, situation.  He does have some right-sided facial weakness and  dysarthria but is at his baseline cognitive status. NIHSS of 4.  -CT imaging negative for acute intracranial pathology.  Cannot rule out stroke at this time though family does not want aggressive measures and requests for comfort measures only.  Will not pursue further stroke workup as is the goal of care for the patient provided by family.  Recommendations: - Code stroke cancelled as further intervention is not within the family's goals of care for patient. Family requests comfort measures only and is on hospice care at home.  - Management of behavioral disturbance as necessary per EDP.  - Neurology will sign off, please contact neurology on call with further questions or concerns.  Pt seen by NP/Neuro and later by MD. Note/plan to be edited by MD as needed.  Anibal Henderson, AGAC-NP [facility] Pager: [phone]  Attending Neurologist's note:  I personally saw this patient, gathering history, performing a brief examination, reviewing relevant labs, personally reviewing relevant imaging including head CT, and formulated the assessment and plan, adding the note above for completeness and clarity to accurately reflect my thoughts  Lesleigh Noe MD-PhD [facility] [phone]

## 2022-06-07 DIAGNOSIS — R4182 Altered mental status, unspecified: Secondary | ICD-10-CM | POA: Diagnosis not present

## 2022-06-07 DIAGNOSIS — D696 Thrombocytopenia, unspecified: Secondary | ICD-10-CM

## 2022-06-07 DIAGNOSIS — R451 Restlessness and agitation: Secondary | ICD-10-CM | POA: Diagnosis not present

## 2022-06-07 DIAGNOSIS — Z515 Encounter for palliative care: Secondary | ICD-10-CM

## 2022-06-07 DIAGNOSIS — E876 Hypokalemia: Secondary | ICD-10-CM | POA: Diagnosis not present

## 2022-06-07 LAB — TSH: TSH: 4.399 u[IU]/mL (ref 0.350–4.500)

## 2022-06-07 MED ORDER — GLYCOPYRROLATE 0.2 MG/ML IJ SOLN: 0.4000 mg | INTRAMUSCULAR | Status: DC | PRN

## 2022-06-07 MED ORDER — QUETIAPINE FUMARATE 25 MG PO TABS
50.0000 mg | ORAL_TABLET | Freq: Every day | ORAL | Status: DC
  Administered 2022-06-07 – 2022-06-11 (×5): 50 mg via ORAL
  Filled 2022-06-07 (×5): qty 2

## 2022-06-07 MED ORDER — ONDANSETRON HCL 4 MG/2ML IJ SOLN: 4.0000 mg | Freq: Four times a day (QID) | INTRAMUSCULAR | Status: DC | PRN

## 2022-06-07 MED ORDER — LORAZEPAM 2 MG/ML IJ SOLN
1.0000 mg | INTRAMUSCULAR | Status: DC
  Administered 2022-06-07 – 2022-06-08 (×6): 1 mg via INTRAVENOUS
  Filled 2022-06-07 (×6): qty 1

## 2022-06-07 MED ORDER — HALOPERIDOL LACTATE 5 MG/ML IJ SOLN
1.0000 mg | Freq: Four times a day (QID) | INTRAMUSCULAR | Status: DC | PRN
  Administered 2022-06-07: 1 mg via INTRAVENOUS

## 2022-06-07 MED ORDER — ACETAMINOPHEN 325 MG PO TABS: 650.0000 mg | ORAL_TABLET | Freq: Four times a day (QID) | ORAL | Status: DC | PRN

## 2022-06-07 MED ORDER — ALBUTEROL SULFATE (2.5 MG/3ML) 0.083% IN NEBU: 2.5000 mg | INHALATION_SOLUTION | RESPIRATORY_TRACT | Status: DC | PRN

## 2022-06-07 MED ORDER — LORAZEPAM 2 MG/ML IJ SOLN: 1.0000 mg | INTRAMUSCULAR | Status: DC | PRN

## 2022-06-07 MED ORDER — GLYCOPYRROLATE 1 MG PO TABS: 2.0000 mg | ORAL_TABLET | ORAL | Status: DC | PRN

## 2022-06-07 MED ORDER — LORAZEPAM 1 MG PO TABS: 1.0000 mg | ORAL_TABLET | ORAL | Status: DC | PRN

## 2022-06-07 MED ORDER — STERILE WATER FOR INJECTION IJ SOLN
INTRAMUSCULAR | Status: AC
  Filled 2022-06-07: qty 10

## 2022-06-07 MED ORDER — LORAZEPAM 2 MG/ML PO CONC: 1.0000 mg | ORAL | Status: DC | PRN

## 2022-06-07 MED ORDER — MORPHINE SULFATE 10 MG/5ML PO SOLN: 5.0000 mg | ORAL | Status: DC | PRN

## 2022-06-07 MED ORDER — ONDANSETRON 4 MG PO TBDP: 4.0000 mg | ORAL_TABLET | Freq: Four times a day (QID) | ORAL | Status: DC | PRN

## 2022-06-07 MED ORDER — BIOTENE DRY MOUTH MT LIQD: 15.0000 mL | OROMUCOSAL | Status: DC | PRN

## 2022-06-07 MED ORDER — MORPHINE SULFATE (CONCENTRATE) 10 MG/0.5ML PO SOLN
5.0000 mg | ORAL | Status: DC
  Administered 2022-06-07 – 2022-06-08 (×5): 5 mg via ORAL
  Filled 2022-06-07 (×5): qty 0.5

## 2022-06-07 MED ORDER — TRAZODONE HCL 50 MG PO TABS
100.0000 mg | ORAL_TABLET | Freq: Every day | ORAL | Status: DC
  Administered 2022-06-07 – 2022-06-11 (×5): 100 mg via ORAL
  Filled 2022-06-07 (×5): qty 2

## 2022-06-07 MED ORDER — AMOXICILLIN 500 MG PO CAPS
1000.0000 mg | ORAL_CAPSULE | Freq: Three times a day (TID) | ORAL | Status: DC
  Administered 2022-06-08 – 2022-06-12 (×10): 1000 mg via ORAL
  Filled 2022-06-07 (×18): qty 2

## 2022-06-07 MED ORDER — MORPHINE SULFATE (PF) 2 MG/ML IV SOLN
1.0000 mg | INTRAVENOUS | Status: DC | PRN
  Administered 2022-06-11: 1 mg via INTRAVENOUS
  Filled 2022-06-07: qty 1

## 2022-06-07 MED ORDER — ACETAMINOPHEN 650 MG RE SUPP: 650.0000 mg | Freq: Four times a day (QID) | RECTAL | Status: DC | PRN

## 2022-06-07 MED ORDER — POLYVINYL ALCOHOL 1.4 % OP SOLN: 1.0000 [drp] | Freq: Four times a day (QID) | OPHTHALMIC | Status: DC | PRN

## 2022-06-07 MED ORDER — PANTOPRAZOLE SODIUM 40 MG PO TBEC
40.0000 mg | DELAYED_RELEASE_TABLET | Freq: Every day | ORAL | Status: DC
  Administered 2022-06-07 – 2022-06-08 (×2): 40 mg via ORAL
  Filled 2022-06-07 (×2): qty 1

## 2022-06-07 MED ORDER — TAMSULOSIN HCL 0.4 MG PO CAPS
0.4000 mg | ORAL_CAPSULE | Freq: Every day | ORAL | Status: DC
  Administered 2022-06-07 – 2022-06-12 (×3): 0.4 mg via ORAL
  Filled 2022-06-07 (×4): qty 1

## 2022-06-07 NOTE — Progress Notes (Signed)
     Daily Progress Note Intern Pager: [phone]  Patient name: Brendan [name] Medical record number: [id number] Date of birth: 23-Mar-1941 Age: 82 y.o. Gender: male  Primary Care Provider: Precious Gilding, DO Consultants: Palliative care Code Status: DNR  Pt Overview and Major Events to Date:  2/20: Admitted to FMTS  Assessment and Plan: Brendan [name] is a 82 y.o. male presenting with agitation.  Past medical history includes dementia, CAD s/p CABG, hypertension, hyperlipidemia, ETD s/p bilateral deep brain stimulator, stroke, peripheral neuropathy and dementia.  * Agitation Family wishes to continue hospice care and not pursue outstanding interventions. Unsure if they would like to treated UTI or continue some home medications. Family will discuss and report back to Medicine team this PM. Palliative care met with family and discussed hospice placement. SLP cleared patient to take oral medications. - Palliative consult, appreciate recs - Comfort care measures    Chronic medical conditions HTN: Comfort care, not currently treating HLD: Comfort care, not currently treating CAD: Comfort care, not currently treating   FEN/GI: Dysphagia 3 diet PPx: Comfort care Dispo: Pending hospice placement  Subjective:  Patient assessed at bedside. Oriented to self and place. Responsive to questions and states he would be willing to eat. Reports mild pain in his legs.  Collateral from daughter obtained. She report patient has "more bad moment than good" as he has lashed out at multiple family members and healthcare works. States they are unable to care for him at home given safety concerns. Family would like to discuss other medical interventions such as UTI treatment and some home medications.  Objective: Temp:  [97.7 F (36.5 C)-98.3 F (36.8 C)] 97.7 F (36.5 C) (02/21 1034) Pulse Rate:  [70-106] 81 (02/21 1034) Resp:  [13-21] 18 (02/21 1034) BP: (148-197)/(77-127) 165/88 (02/21  1034) SpO2:  [82 %-99 %] 99 % (02/21 1034) Weight:  [70.8 kg] 70.8 kg (02/20 1600) Physical Exam: General: Alert, layinging in bed looking around. NAD Cardiovascular: RRR without murmur Respiratory: CTAB. Normal WOB on RA Abdomen: Soft, non-tender, non-distended Extremities: No peripheral edema  Laboratory: Most recent CBC Lab Results  Component Value Date   WBC 5.6 06/06/2022   HGB 12.9 (L) 06/06/2022   HCT 38.0 (L) 06/06/2022   MCV 92.3 06/06/2022   PLT 123 (L) 06/06/2022   Most recent BMP    Latest Ref Rng & Units 06/06/2022    4:20 PM  BMP  Glucose 70 - 99 mg/dL 133   BUN 8 - 23 mg/dL 9   Creatinine 0.61 - 1.24 mg/dL 0.90   Sodium 135 - 145 mmol/L 140   Potassium 3.5 - 5.1 mmol/L 3.3   Chloride 98 - 111 mmol/L 98     Other pertinent labs: UA: protein 100, rare bacteria Ethanol <10 PT/PTT wnl  Imaging/Diagnostic Tests: CT HEAD CODE STROKE WO CONTRAST Result Date: 06/06/2022 IMPRESSION: Stable noncontrast head CT with no acute intracranial pathology.   Colletta Maryland, MD 06/07/2022, 1:43 PM  PGY-1, [place] Intern pager: [phone], text pages welcome Secure chat group [place]

## 2022-06-07 NOTE — ED Notes (Addendum)
Pt awake now and trying to get out of bed, swats at this RN while trying to help him get more comfortable in bed, does not want RN to place pillow or adjust HOB. Responds angrily "no" when offered toileting, blanket, and pain control. Admitting team notified.   Unable to do head to toe assessment at this time d/t this reason

## 2022-06-07 NOTE — ED Notes (Signed)
ED TO INPATIENT HANDOFF REPORT  ED Nurse Name and Phone #: Effie Shy Name/Age/Gender Brendan [name] 82 y.o. male Room/Bed: 045C/045C  Code Status   Code Status: DNR  Home/SNF/Other Coming from home. Possible need for SNF admission Patient oriented to: self and place Is this baseline? Yes   Triage Complete: Triage complete  Chief Complaint Agitation [R45.1]  Triage Note Pt BIB EMS due to code stroke. Pt was more agitated, having delusions, and not taking medicine so care giver called EMS. Pt is hospice pt. EMS states pt was having right sided weakness right sided facial droop. EMS also states pt had possible STEMI.    Allergies Allergies  Allergen Reactions   Lisinopril Cough    Level of Care/Admitting Diagnosis ED Disposition     ED Disposition  Admit   Condition  --   Comment  Hospital Area: [facility] [place] [[id number]]  Level of Care: Med-Surg [16]  May place patient in observation at [facility] or [facility] if equivalent level of care is available:: No  Covid Evaluation: Asymptomatic - no recent exposure (last 10 days) testing not required  Diagnosis: Agitation [208335]  Admitting Physician: [name] [id number]  Attending Physician: Andrena Mews T [[id number]]          B Medical/Surgery History Past Medical History:  Diagnosis Date   Anxiety disorder    Benign enlargement of prostate    CAD in native artery 1997   Referred for CABG x3 in 2004 for LAD diagonal bifurcation lesion   Cervical spinal stenosis    Complex regional pain syndrome of right upper extremity    Right wrist; L arm   Coronary atherosclerosis of artery bypass graft 09/2011   Occluded SVG-D1; Cardiologist Dr. Ellyn Hack   Gait disorder    Gastroesophageal reflux disease    Glaucoma    Hiatal hernia    GI: Dr Penelope Coop   Hyperlipidemia LDL goal <70    Hypertension    Memory loss    Mild   Obesity    Peripheral neuropathy    possible peripheral neuropathy    Pre-diabetes    S/P CABG x 3 2004    LIMA-LAD, SVG to diagonal, SVG to OM   Tremor, essential    On Primidone   Unstable angina pectoris ([place]) 09/2011   Cardiac cath: occluded SVG-DI. Patent LIMA-LAD and SVG-OM. EF 45% with apical inferior HK.   Past Surgical History:  Procedure Laterality Date   ANTERIOR CERVICAL DECOMP/DISCECTOMY FUSION N/A 11/30/2020   Procedure: Cervical three-four, Cervical four-fve  Anterior cervical decompression/discectomy/fusion;  Surgeon: Erline Levine, MD;  Location: [place];  Service: Neurosurgery;  Laterality: N/A;   CATARACT EXTRACTION, BILATERAL     CORONARY ARTERY BYPASS GRAFT  06/2002   LIMA-LAD, SVG-D1, SVG-OM   EYE SURGERY     KNEE SURGERY Left    LEFT HEART CATH AND CORONARY ANGIOGRAPHY  1997; 2004   In 2004 was referred for CABG due to a LAD-bifurcation lesion   LEFT HEART CATHETERIZATION WITH CORONARY/GRAFT ANGIOGRAM N/A 10/13/2011   Procedure: LEFT HEART CATHETERIZATION WITH Beatrix Fetters;  Surgeon: Leonie Man, MD;  Location: [facility] CATH LAB;;  occluded SVG-DI. Patent LIMA-LAD and SVG-OM. EF 45% with apical inferior HK.;   MINOR PLACEMENT OF FIDUCIAL N/A 04/26/2017   Procedure: Fiducial placement;  Surgeon: Erline Levine, MD;  Location: [place];  Service: Neurosurgery;  Laterality: N/A;  Fiducial placement   NM MYOCAR PERF WALL MOTION  03/23/2009   protocol:Bruce, normal  perfusion in all regions, post-stress EF 72%, exercise capacity 7METS, EKG negative for ischemia.   PULSE GENERATOR IMPLANT Bilateral 11/13/2017   Procedure: Bilateral Implantable pulse generator;  Surgeon: Erline Levine, MD;  Location: [place];  Service: Neurosurgery;  Laterality: Bilateral;  Bilateral Implantable pulse generator   Shoulder Orthoscopic Surgery   06/2003   SPINAL CORD STIMULATOR BATTERY EXCHANGE Bilateral 02/16/2022   Procedure: Bilateral change IPG battery;  Surgeon: Dawley, Theodoro Doing, DO;  Location: [place];  Service: Neurosurgery;  Laterality: Bilateral;  RM 20 to  follow 3C   SUBTHALAMIC STIMULATOR INSERTION Bilateral 05/04/2017   Procedure: Bilateral Deep brain stimulator placement;  Surgeon: Erline Levine, MD;  Location: [place];  Service: Neurosurgery;  Laterality: Bilateral;  Bilateral deep brain stimulator placement   TRANSTHORACIC ECHOCARDIOGRAM  04/2017   Jan 2019: Normal LV size and function.  EF 55-60%.  No or W MA.  Mild LA dilation.   wrist ganglion cyst Left      A IV Location/Drains/Wounds Patient Lines/Drains/Airways Status     Active Line/Drains/Airways     Name Placement date Placement time Site Days   Peripheral IV 06/06/22 18 G Anterior;Distal;Right;Upper Arm 06/06/22  1637  Arm  1   Peripheral IV 06/06/22 18 G Anterior;Distal;Left;Upper Arm 06/06/22  1637  Arm  1   Pressure Injury 05/04/17 Deep Tissue Injury - Purple or maroon localized area of discolored intact skin or blood-filled blister due to damage of underlying soft tissue from pressure and/or shear. 05/04/17  2025  -- 1860            Intake/Output Last 24 hours  Intake/Output Summary (Last 24 hours) at 06/07/2022 0843 Last data filed at 06/07/2022 [id number] Gross per 24 hour  Intake --  Output 801 ml  Net -801 ml    Labs/Imaging Results for orders placed or performed during the hospital encounter of 06/06/22 (from the past 48 hour(s))  Protime-INR     Status: None   Collection Time: 06/06/22  4:15 PM  Result Value Ref Range   Prothrombin Time 14.1 11.4 - 15.2 seconds   INR 1.1 0.8 - 1.2    Comment: (NOTE) INR goal varies based on device and disease states. Performed at [facility], [place] [address] [place], [place] [address]   APTT     Status: None   Collection Time: 06/06/22  4:15 PM  Result Value Ref Range   aPTT 29 24 - 36 seconds    Comment: Performed at [facility] [address], [place], [place] [address]  CBC     Status: Abnormal   Collection Time: 06/06/22  4:15 PM  Result Value Ref Range   WBC 5.6 4.0 - 10.5 K/uL   RBC 4.03 (L)  4.22 - 5.81 MIL/uL   Hemoglobin 12.6 (L) 13.0 - 17.0 g/dL   HCT 37.2 (L) 39.0 - 52.0 %   MCV 92.3 80.0 - 100.0 fL   MCH 31.3 26.0 - 34.0 pg   MCHC 33.9 30.0 - 36.0 g/dL   RDW 14.3 11.5 - 15.5 %   Platelets 123 (L) 150 - 400 K/uL    Comment: REPEATED TO VERIFY   nRBC 0.0 0.0 - 0.2 %    Comment: Performed at [facility], [place] [address], [place], [place] [address]  Differential     Status: None   Collection Time: 06/06/22  4:15 PM  Result Value Ref Range   Neutrophils Relative % 69 %   Neutro  Abs 3.8 1.7 - 7.7 K/uL   Lymphocytes Relative 24 %   Lymphs Abs 1.4 0.7 - 4.0 K/uL   Monocytes Relative 6 %   Monocytes Absolute 0.3 0.1 - 1.0 K/uL   Eosinophils Relative 1 %   Eosinophils Absolute 0.1 0.0 - 0.5 K/uL   Basophils Relative 0 %   Basophils Absolute 0.0 0.0 - 0.1 K/uL   Immature Granulocytes 0 %   Abs Immature Granulocytes 0.02 0.00 - 0.07 K/uL    Comment: Performed at [facility] [address] [place], [place] [address]  Comprehensive metabolic panel     Status: Abnormal   Collection Time: 06/06/22  4:15 PM  Result Value Ref Range   Sodium 137 135 - 145 mmol/L   Potassium 3.2 (L) 3.5 - 5.1 mmol/L   Chloride 98 98 - 111 mmol/L   CO2 28 22 - 32 mmol/L   Glucose, Bld 138 (H) 70 - 99 mg/dL    Comment: Glucose reference range applies only to samples taken after fasting for at least 8 hours.   BUN 9 8 - 23 mg/dL   Creatinine, Ser 1.00 0.61 - 1.24 mg/dL   Calcium 9.0 8.9 - 10.3 mg/dL   Total Protein 6.4 (L) 6.5 - 8.1 g/dL   Albumin 3.5 3.5 - 5.0 g/dL   AST 20 15 - 41 U/L   ALT 9 0 - 44 U/L   Alkaline Phosphatase 74 38 - 126 U/L   Total Bilirubin 1.3 (H) 0.3 - 1.2 mg/dL   GFR, Estimated >60 >60 mL/min    Comment: (NOTE) Calculated using the CKD-EPI Creatinine Equation (2021)    Anion gap 11 5 - 15    Comment: Performed at [facility], [place] [address] [place], [place] [address]  Ethanol     Status: None   Collection Time: 06/06/22  4:15 PM  Result  Value Ref Range   Alcohol, Ethyl (B) <10 <10 mg/dL    Comment: (NOTE) Lowest detectable limit for serum alcohol is 10 mg/dL.  For medical purposes only. Performed at [facility], [place] [address], [place], [place] [address]   CBG monitoring, ED     Status: Abnormal   Collection Time: 06/06/22  4:16 PM  Result Value Ref Range   Glucose-Capillary 128 (H) 70 - 99 mg/dL    Comment: Glucose reference range applies only to samples taken after fasting for at least 8 hours.  I-stat chem 8, ED     Status: Abnormal   Collection Time: 06/06/22  4:20 PM  Result Value Ref Range   Sodium 140 135 - 145 mmol/L   Potassium 3.3 (L) 3.5 - 5.1 mmol/L   Chloride 98 98 - 111 mmol/L   BUN 9 8 - 23 mg/dL   Creatinine, Ser 0.90 0.61 - 1.24 mg/dL   Glucose, Bld 133 (H) 70 - 99 mg/dL    Comment: Glucose reference range applies only to samples taken after fasting for at least 8 hours.   Calcium, Ion 1.17 1.15 - 1.40 mmol/L   TCO2 28 22 - 32 mmol/L   Hemoglobin 12.9 (L) 13.0 - 17.0 g/dL   HCT 38.0 (L) 39.0 - 52.0 %  Urinalysis, Routine w reflex microscopic -Urine, Clean Catch     Status: Abnormal   Collection Time: 06/06/22  5:06 PM  Result Value Ref Range   Color, Urine YELLOW YELLOW   APPearance HAZY (A) CLEAR   Specific Gravity, Urine 1.014 1.005 - 1.030  pH 6.0 5.0 - 8.0   Glucose, UA NEGATIVE NEGATIVE mg/dL   Hgb urine dipstick SMALL (A) NEGATIVE   Bilirubin Urine NEGATIVE NEGATIVE   Ketones, ur NEGATIVE NEGATIVE mg/dL   Protein, ur 100 (A) NEGATIVE mg/dL   Nitrite NEGATIVE NEGATIVE   Leukocytes,Ua NEGATIVE NEGATIVE   RBC / HPF 21-50 0 - 5 RBC/hpf   WBC, UA 0-5 0 - 5 WBC/hpf   Bacteria, UA RARE (A) NONE SEEN   Squamous Epithelial / HPF 0-5 0 - 5 /HPF   Mucus PRESENT    Hyaline Casts, UA PRESENT     Comment: Performed at [facility] [address], [place], [place] [address]   CT HEAD CODE STROKE WO CONTRAST  Result Date: 06/06/2022 CLINICAL DATA:  Code stroke.  Right facial  droop and slurred speech. EXAM: CT HEAD WITHOUT CONTRAST TECHNIQUE: Contiguous axial images were obtained from the base of the skull through the vertex without intravenous contrast. RADIATION DOSE REDUCTION: This exam was performed according to the departmental dose-optimization program which includes automated exposure control, adjustment of the mA and/or kV according to patient size and/or use of iterative reconstruction technique. COMPARISON:  CT head 05/15/2022 FINDINGS: Brain: There is no evidence of acute intracranial hemorrhage, extra-axial fluid collection, or acute infarct Deep brain stimulator leads terminating in the region of the thalami are unchanged. Parenchymal volume loss with prominence of the ventricular system and extra-axial CSF spaces is unchanged. The ventricles are stable in size. Patchy hypodensity in the supratentorial white matter likely reflecting sequela of chronic small-vessel ischemic change is stable. The pituitary and suprasellar region are normal. There is no mass lesion. There is no mass effect or midline shift. Vascular: No dense vessel is seen. There is calcification of the bilateral carotid siphons and vertebral arteries. Skull: Choose Sinuses/Orbits: The imaged paranasal sinuses are clear. Bilateral lens implants are in place. The globes and orbits are otherwise unremarkable. Other: None. ASPECTS ([place] Stroke Program Early CT Score) - Ganglionic level infarction (caudate, lentiform nuclei, internal capsule, insula, M1-M3 cortex): 7 - Supraganglionic infarction (M4-M6 cortex): 3 Total score (0-10 with 10 being normal): 10 IMPRESSION: Stable noncontrast head CT with no acute intracranial pathology. Findings communicated to Dr. Curly Shores via Shea Evans at 4:29pm. Electronically Signed   By: Valetta Mole M.D.   On: 06/06/2022 16:30    Pending Labs Unresulted Labs (From admission, onward)     Start     Ordered   06/07/22 0728  TSH  Once,   R        06/07/22 0727   06/06/22 1706   Urine Culture (for pregnant, neutropenic or urologic patients or patients with an indwelling urinary catheter)  (Urine Labs)  Once,   URGENT       Question:  Indication  Answer:  Urgency/frequency   06/06/22 1705            Vitals/Pain Today's Vitals   06/07/22 0330 06/07/22 0415 06/07/22 0645 06/07/22 0730  BP: (!) 148/88 (!) 150/87 (!) 166/77   Pulse: 87 70 85   Resp: 16  14   Temp:      TempSrc:      SpO2: 93% 96% 97%   Weight:      PainSc:    0-No pain    Isolation Precautions No active isolations  Medications Medications  morphine CONCENTRATE 10 MG/0.5ML oral solution 5 mg (has no administration in time range)  amoxicillin (AMOXIL) capsule 1,000 mg (1,000 mg Oral Not Given 06/07/22  0503)  QUEtiapine (SEROQUEL) tablet 50 mg (0 mg Oral Hold 06/07/22 0305)  traZODone (DESYREL) tablet 100 mg (0 mg Oral Hold 06/07/22 0305)  pantoprazole (PROTONIX) EC tablet 40 mg (has no administration in time range)  tamsulosin (FLOMAX) capsule 0.4 mg (has no administration in time range)  albuterol (PROVENTIL) (2.5 MG/3ML) 0.083% nebulizer solution 2.5 mg (has no administration in time range)  haloperidol lactate (HALDOL) injection 1 mg (1 mg Intravenous Given 06/07/22 0621)  sodium chloride flush (NS) 0.9 % injection 3 mL (3 mLs Intravenous Given 06/06/22 1638)    Mobility walks with person assist. Have not assessed ambulation in ED     Focused Assessments    R Recommendations: See Admitting Provider Note  Report given to:   Additional Notes:  intermittent confusion and agitation

## 2022-06-07 NOTE — Evaluation (Signed)
Clinical/Bedside Swallow Evaluation Patient Details  Name: Brendan [name] MRN: [id number] Date of Birth: [birth year]  Today's Date: 06/07/2022 Time: SLP Start Time (ACUTE ONLY): 0915 SLP Stop Time (ACUTE ONLY): 0924 SLP Time Calculation (min) (ACUTE ONLY): 9 min  Past Medical History:  Past Medical History:  Diagnosis Date   Anxiety disorder    Benign enlargement of prostate    CAD in native artery 1997   Referred for CABG x3 in 2004 for LAD diagonal bifurcation lesion   Cervical spinal stenosis    Complex regional pain syndrome of right upper extremity    Right wrist; L arm   Coronary atherosclerosis of artery bypass graft 09/2011   Occluded SVG-D1; Cardiologist Dr. Ellyn Hack   Gait disorder    Gastroesophageal reflux disease    Glaucoma    Hiatal hernia    GI: Dr Penelope Coop   Hyperlipidemia LDL goal <70    Hypertension    Memory loss    Mild   Obesity    Peripheral neuropathy    possible peripheral neuropathy   Pre-diabetes    S/P CABG x 3 2004    LIMA-LAD, SVG to diagonal, SVG to OM   Tremor, essential    On Primidone   Unstable angina pectoris ([place]) 09/2011   Cardiac cath: occluded SVG-DI. Patent LIMA-LAD and SVG-OM. EF 45% with apical inferior HK.   Past Surgical History:  Past Surgical History:  Procedure Laterality Date   ANTERIOR CERVICAL DECOMP/DISCECTOMY FUSION N/A 11/30/2020   Procedure: Cervical three-four, Cervical four-fve  Anterior cervical decompression/discectomy/fusion;  Surgeon: Erline Levine, MD;  Location: [place];  Service: Neurosurgery;  Laterality: N/A;   CATARACT EXTRACTION, BILATERAL     CORONARY ARTERY BYPASS GRAFT  06/2002   LIMA-LAD, SVG-D1, SVG-OM   EYE SURGERY     KNEE SURGERY Left    LEFT HEART CATH AND CORONARY ANGIOGRAPHY  1997; 2004   In 2004 was referred for CABG due to a LAD-bifurcation lesion   LEFT HEART CATHETERIZATION WITH CORONARY/GRAFT ANGIOGRAM N/A 10/13/2011   Procedure: LEFT HEART CATHETERIZATION WITH Beatrix Fetters;   Surgeon: Leonie Man, MD;  Location: [facility] CATH LAB;;  occluded SVG-DI. Patent LIMA-LAD and SVG-OM. EF 45% with apical inferior HK.;   MINOR PLACEMENT OF FIDUCIAL N/A 04/26/2017   Procedure: Fiducial placement;  Surgeon: Erline Levine, MD;  Location: [place];  Service: Neurosurgery;  Laterality: N/A;  Fiducial placement   NM MYOCAR PERF WALL MOTION  03/23/2009   protocol:Bruce, normal perfusion in all regions, post-stress EF 72%, exercise capacity 7METS, EKG negative for ischemia.   PULSE GENERATOR IMPLANT Bilateral 11/13/2017   Procedure: Bilateral Implantable pulse generator;  Surgeon: Erline Levine, MD;  Location: [place];  Service: Neurosurgery;  Laterality: Bilateral;  Bilateral Implantable pulse generator   Shoulder Orthoscopic Surgery   06/2003   SPINAL CORD STIMULATOR BATTERY EXCHANGE Bilateral 02/16/2022   Procedure: Bilateral change IPG battery;  Surgeon: Dawley, Theodoro Doing, DO;  Location: [place];  Service: Neurosurgery;  Laterality: Bilateral;  RM 20 to follow 3C   SUBTHALAMIC STIMULATOR INSERTION Bilateral 05/04/2017   Procedure: Bilateral Deep brain stimulator placement;  Surgeon: Erline Levine, MD;  Location: [place];  Service: Neurosurgery;  Laterality: Bilateral;  Bilateral deep brain stimulator placement   TRANSTHORACIC ECHOCARDIOGRAM  04/2017   Jan 2019: Normal LV size and function.  EF 55-60%.  No or W MA.  Mild LA dilation.   wrist ganglion cyst Left    HPI:  82 yo was brought in by the  family for agitation, confusion and new facial droop. PMHx includes CAD, unstable angina, HTN, HLD, Dementia, and Thyroid dysfunction in hospice care. Per chart "pt is comfort care with hospice, family does not desire further workup, however not have safe disposition home, where wife is primary caregiver. Pt appears at his baseline based on interviewer's prio experience with pt and chart review." Code stroke which was discontinued per family wishes."    Assessment / Plan / Recommendation  Clinical  Impression  Pt, who is on comfort hospice care, seen for swallow assessment with suspected minimal cognitive and esophageal based dysphagia. He was alert, cooperative and followed commands with supervision. He has upper denture plate without lower dentition or dentures present. He had multiple swallows with thicker consistencies. Mastication of initial trial cracker followed by immediate cough likely due to inadequate mastication (mastication was rapid and appeared incomplete). He had no s/s aspiration with thin or puree. There was eructation x 1. Pt is on comfort measures and recommend Dys 3 (chopped meats), thin liquids and pills whole in puree. Sit upright with meals and remain upright for min 30 minutes. No further ST is warranted at this time. Chopped meats is appropriate for duration of this hospitalization. SLP Visit Diagnosis: Dysphagia, unspecified (R13.10)    Aspiration Risk  Mild aspiration risk    Diet Recommendation Dysphagia 3 (Mech soft);Thin liquid   Liquid Administration via: Straw;Cup Medication Administration: Whole meds with puree Supervision: Staff to assist with self feeding;Full supervision/cueing for compensatory strategies Compensations: Slow rate;Small sips/bites;Minimize environmental distractions Postural Changes: Seated upright at 90 degrees    Other  Recommendations Oral Care Recommendations: Oral care BID    Recommendations for follow up therapy are one component of a multi-disciplinary discharge planning process, led by the attending physician.  Recommendations may be updated based on patient status, additional functional criteria and insurance authorization.  Follow up Recommendations No SLP follow up      Assistance Recommended at Discharge    Functional Status Assessment Patient has not had a recent decline in their functional status  Frequency and Duration            Prognosis        Swallow Study   General HPI: 82 yo was brought in by the family  for agitation, confusion and new facial droop. PMHx includes CAD, unstable angina, HTN, HLD, Dementia, and Thyroid dysfunction in hospice care. Per chart "pt is comfort care with hospice, family does not desire further workup, however not have safe disposition home, where wife is primary caregiver. Pt appears at his baseline based on interviewer's prio experience with pt and chart review." Code stroke which was discontinued per family wishes." Type of Study: Bedside Swallow Evaluation Previous Swallow Assessment:  (none) Diet Prior to this Study: Regular;Thin liquids (Level 0) Temperature Spikes Noted: No Respiratory Status: Room air History of Recent Intubation: No Behavior/Cognition: Alert;Cooperative;Pleasant mood;Requires cueing Oral Cavity Assessment: Within Functional Limits Oral Care Completed by SLP: No Oral Cavity - Dentition: Dentures, top;Other (Comment) (no lower dentition) Vision: Functional for self-feeding Self-Feeding Abilities: Needs assist Patient Positioning: Upright in bed Baseline Vocal Quality: Normal Volitional Cough: Weak Volitional Swallow: Able to elicit    Oral/Motor/Sensory Function Overall Oral Motor/Sensory Function: Within functional limits   Ice Chips Ice chips: Not tested   Thin Liquid Thin Liquid: Within functional limits Presentation: Straw Oral Phase Impairments:  (none) Oral Phase Functional Implications:  (none) Pharyngeal  Phase Impairments:  (audible swallow)    Nectar Thick Nectar Thick  Liquid: Not tested   Honey Thick Honey Thick Liquid: Not tested   Puree Puree: Impaired Pharyngeal Phase Impairments: Multiple swallows   Solid     Solid: Impaired Pharyngeal Phase Impairments: Cough - Immediate      Mick Sell Orbie Pyo 06/07/2022,9:40 AM

## 2022-06-07 NOTE — ED Notes (Signed)
Pt able to take sips of water with no complications

## 2022-06-07 NOTE — Consult Note (Signed)
Consultation Note Date: 06/07/2022   Patient Name: Brendan [name]  DOB: [birth year]  MRN: [id number]  Age / Sex: 82 y.o., male  PCP: Precious Gilding, DO Referring Physician: McDiarmid, Blane Ohara, MD  Reason for Consultation: Establishing goals of care  HPI/Patient Profile: 82 y.o. male  with past medical history of CAD, unstable angina, HTN, HLD, Dementia, and Thyroid dysfunction admitted on 06/06/2022 with worsening agitation, confusion, facial droop.   Patient agitation and confusion have been chronic, recurrent, and escalating in severity recently.  Stroke workup was negative and no further workup was pursued given patient's hospice status. PMT has been consulted to assist with goals of care conversation regarding options for placement with hospice.  Clinical Assessment and Goals of Care:  I have reviewed medical records including EPIC notes, labs and imaging, received report from RN, assessed the patient and then had a phone conversation with patient's wife Brendan [name] and daughter Brendan [name] to discuss diagnosis prognosis, [name], EOL wishes, disposition and options.  I introduced Palliative Medicine as specialized medical care for people living with serious illness. It focuses on providing relief from the symptoms and stress of a serious illness. The goal is to improve quality of life for both the patient and the family.  We discussed a brief life review of the patient and then focused on their current illness.  The natural disease trajectory and expectations at EOL were discussed.  I attempted to elicit values and goals of care important to the patient.    Medical History Review and Understanding:  Patient's family have a good understanding the severity of his illness.  We discussed the possibility that he could be experiencing terminal agitation.  Social History: Patient lives at home with his wife.  Daughter reports he  previously had "violent tendencies" that seem to be resurfacing as he continues to decline.  Functional and Nutritional State: Patient has continued to decline while at home with hospice.  He ambulates mostly using a wheelchair that he pushes on his own.  He is a "picky eater" and has been refusing medications even when crushed in food.  Palliative Symptoms: Agitation, poorly controlled  Advance Directives: A detailed discussion regarding advanced directives was had.  Patient's daughter Brendan [name] is HCPOA.  No documentation currently on file.   Code Status: Concepts specific to code status, artifical feeding and hydration, and rehospitalization were considered and discussed.  DNR confirmed.  Discussion: Emotional support therapeutic listening was provided as patient's family shared the difficulty they have had keeping him safe or calm at home.  They report he was being seen by [facility] and the hospice nurse was called during his most recent episode of agitation.  She stayed for several hours and ultimately recommended calling 911 for transportation to hospital.  His stroke symptoms have now resolved but agitation persists.  This used to only occur at night but now occurs throughout the day.   We thoroughly reviewed patient's home medications that are currently on the [facility] and rationale for patient missing Seroquel last night (he was still n.p.o. after code stroke).  They confirm they would like comfort focused care during this hospitalization.  We reviewed options for symptom management and different medications that could be scheduled and provided as needed.  The risks and benefits were explained.  They would like to schedule pain medicine, as he will often deny any pain while showing clear signs of pain.  We discussed that his agitation could also be due to uncontrolled pain.  They  are also agreeable to scheduling Ativan. We then reviewed options for facility placement and hospice support.   They are discouraged that he was not considered a residential hospice candidate last admission, however we discussed that with uncontrolled symptoms he may be a better candidate at this time.  They would like to [name] into referrals for both local hospice facilities and plan to tour them as well.   The difference between aggressive medical intervention and comfort care was considered in light of the patient's goals of care.  Discussed the importance of continued conversation with family and the medical providers regarding overall plan of care and treatment options, ensuring decisions are within the context of the patient's values and GOCs.   Questions and concerns were addressed.  The family was encouraged to call with questions or concerns.  PMT will continue to support holistically.   SUMMARY OF RECOMMENDATIONS   -Continue DNR/DNI -Continue comfort care with the following adjustments: Roxanol 5 mg p.o. every 4 hours Ativan 1 mg IV every 4 hours Morphine 1 mg IV every 2 hours as needed Ativan 1 mg every 2 hours as needed -TOC consulted for referral to residential hospice facilities per family wishes.  He may now be candidate given uncontrolled agitation -Psychosocial and emotional support provided -PMT will continue to follow and support  Prognosis:  Weeks  Discharge Planning: To Be Determined      Primary Diagnoses: Present on Admission:  Agitation  Physical Exam Vitals and nursing note reviewed.  Constitutional:      General: He is not in acute distress.    Appearance: He is ill-appearing.  Cardiovascular:     Rate and Rhythm: Normal rate.  Pulmonary:     Effort: Pulmonary effort is normal. No respiratory distress.  Neurological:     Mental Status: He is alert. He is disoriented.  Psychiatric:        Mood and Affect: Mood normal.        Behavior: Behavior normal.    Vital Signs: BP (!) 165/88 (BP Location: Left Arm)   Pulse 81   Temp 97.7 F (36.5 C) (Oral)    Resp 18   Wt 70.8 kg   SpO2 99%   BMI 23.73 kg/m  Pain Scale: 0-10   Pain Score: 0-No pain   SpO2: SpO2: 99 % O2 Device:SpO2: 99 % O2 Flow Rate: .    Palliative Assessment/Data: 40-50%     MDM: high    Marra Fraga Johnnette Litter, PA-C  Palliative Medicine Team Team phone # [phone]  Thank you for allowing the Palliative Medicine Team to assist in the care of this patient. Please utilize secure chat with additional questions, if there is no response within 30 minutes please call the above phone number.  Palliative Medicine Team providers are available by phone from 7am to 7pm daily and can be reached through the team cell phone.  Should this patient require assistance outside of these hours, please call the patient's attending physician.

## 2022-06-07 NOTE — TOC Initial Note (Signed)
Transition of Care [facility]) - Initial/Assessment Note    Patient Details  Name: Brendan [name] MRN: [id number] Date of Birth: [birth year]  [facility] CM/SW Contact:    Curlene Labrum, RN Phone Number: 06/07/2022, 4:43 PM  Clinical Narrative:                 Cm met with the patient at the bedside to discuss TOC needs.  The patient's daughter and wife are at the bedside and patient was admitted to severe agitation.  The family reports that the patient has decreased po intake and has become increasingly aggressive at the home and they are unable to care for him.  The patient's family spoke with Palliative care PA and Inpatient Hospice placement has been requested.  I spoke with the family at the bedside and they are agreeable to referral to [facility].  I called Webb Silversmith and placed the referral.  Merrilyn Puma will evaluate the patient in the am and determine if the patient meets criteria for Inpatient hospice placement.  The patient's family was provided with the Medicare obseration letter.  I will follow up in the am as well.  Expected Discharge Plan: [place] Barriers to Discharge: Continued Medical Work up   Patient Goals and CMS Choice Patient states their goals for this hospitalization and ongoing recovery are:: Patient unable to state goals of care CMS Medicare.gov Compare Post Acute Care list provided to:: Patient Represenative (must comment) (wife at bedside) Choice offered to / list presented to : [place] ownership interest in [facility]provided to:: Spouse    Expected Discharge Plan and Services   Discharge Planning Services: CM Consult Post Acute Care Choice: Residential Hospice Bed Living arrangements for the past 2 months: Single Family Home                                      Prior Living Arrangements/Services Living arrangements for the past 2 months: Single Family Home Lives with::  Spouse, Adult Children Patient language and need for interpreter reviewed:: Yes Do you feel safe going back to the place where you live?: No   wife unable to care for the patient at the home  Need for Family Participation in Patient Care: Yes (Comment) Care giver support system in place?: Yes (comment) Current home services: DME (DME at the home include hospital bed, hoyer lift, RW, WC) Criminal Activity/Legal Involvement Pertinent to Current Situation/Hospitalization: No - Comment as needed  Activities of Daily Living      Permission Sought/Granted Permission sought to share information with : Case Manager, Customer service manager Permission granted to share information with : Yes, Verbal Permission Granted     Permission granted to share info w AGENCY: Inpatient Hospice Referral  Permission granted to share info w Relationship: wife, daughter     Emotional Assessment Appearance:: Appears stated age Attitude/Demeanor/Rapport: Gracious Affect (typically observed): Accepting Orientation: :  (unable to assess) Alcohol / Substance Use: Not Applicable Psych Involvement: No (comment)  Admission diagnosis:  Agitation [R45.1] Delirious [R41.0] Patient Active Problem List   Diagnosis Date Noted   Altered mental status 06/07/2022   Dementia ([place]) 05/16/2022   Hypokalemia 05/16/2022   Encephalopathy 05/16/2022   Acute cystitis with hematuria 05/16/2022   Recurrent UTI 05/15/2022   Intertrigo 01/05/2022   Dark urine 01/04/2022   Right groin pain 07/06/2021   Cervical  myelopathy ([place]) 11/30/2020   COPD (chronic obstructive pulmonary disease) ([place]) 04/01/2020   Tremor, essential 05/06/2019   Neuropathy 01/06/2019   Hyperkalemia 01/06/2019   Abrasion of right heel 11/28/2017   Tremor 11/13/2017   Hydrocele, bilateral 07/17/2017   Scrotal wall edema 07/17/2017   Anemia 07/17/2017   S/P deep brain stimulator placement    Epididymitis    Healthcare maintenance 07/02/2017    Labile blood pressure    Spastic hemiparesis affecting dominant side (HCC)    Thrombocytopenia (HCC)    Agitation    Labile blood glucose    History of Enterococcus UTI 05/29/2017   Neurogenic bladder    AKI (acute kidney injury) ([place])    Hemiplegia (HCC)    Sleep disturbance    Confusion, postoperative    Hypotension due to drugs    Acute blood loss anemia    Diabetes mellitus without complication ([place])    Dysphagia    Intraparenchymal hemorrhage of brain ([place]) 05/18/2017   Weakness of right side of body    Aphasia    Intracerebral hemorrhage 05/16/2017   Cerebral edema (HCC)    Weakness 05/15/2017   Glaucoma    Anxiety state    Cognitive disorder    Urinary retention    Traumatic hemorrhage of left cerebrum without loss of consciousness (HCC)    Seborrheic dermatitis of scalp 10/05/2016   Allergic rhinitis 01/12/2014   Lower extremity edema 01/30/2013   S/P CABG x 3    Atherosclerotic heart disease of native coronary artery without angina pectoris    Hyperlipidemia LDL goal <70    Gait disorder 06/26/2012   RSD (reflex sympathetic dystrophy) 05/24/2012   Asbestosis ([place]) 05/15/2012   Anemia, B12 deficiency 03/19/2012   Coronary artery disease involving coronary bypass graft of native heart with angina pectoris ([place]) 09/16/2011   BPH (benign prostatic hyperplasia) 08/08/2011   Microscopic hematuria 02/27/2011   Anxiety 10/03/2010   Incontinence 07/21/2010   GERD 10/28/2007   Essential tremor 08/30/2006   Essential hypertension 07/06/2006   PCP:  Precious Gilding, DO Pharmacy:   [facility], [place] [address] Phone: [phone] Fax: [phone]     Social Determinants of Health (SDOH) Social History: SDOH Screenings   Food Insecurity: No Food Insecurity (08/13/2019)  Housing: Low Risk  (08/13/2019)  Transportation Needs: No Transportation Needs (08/13/2019)   Alcohol Screen: Low Risk  (08/13/2019)  Depression (PHQ2-9): High Risk (01/04/2022)  Financial Resource Strain: Low Risk  (08/13/2019)  Physical Activity: Inactive (08/13/2019)  Social Connections: Moderately Integrated (08/13/2019)  Stress: No Stress Concern Present (08/13/2019)  Tobacco Use: Medium Risk (06/06/2022)   SDOH Interventions:     Readmission Risk Interventions     No data to display

## 2022-06-07 NOTE — Care Management Obs Status (Cosign Needed)
[facility] NOTIFICATION   Patient Details  Name: Brendan [name] MRN: [id number] Date of Birth: 1940-06-29   Medicare Observation Status Notification Given:  Yes    Curlene Labrum, RN 06/07/2022, 4:14 PM

## 2022-06-07 NOTE — ED Notes (Signed)
Spoke to on call admitting MD about oral meds orders (NPO at this time for failed swallow) and elevated BP. Plan will be to touch base with admitting team and family in the morning and then decide if some aspiration risk is permissible (this was plan last admission). Pt is currently awake but calm and cooperative.

## 2022-06-08 DIAGNOSIS — G250 Essential tremor: Secondary | ICD-10-CM | POA: Diagnosis present

## 2022-06-08 DIAGNOSIS — N390 Urinary tract infection, site not specified: Secondary | ICD-10-CM | POA: Diagnosis present

## 2022-06-08 DIAGNOSIS — R451 Restlessness and agitation: Secondary | ICD-10-CM | POA: Diagnosis present

## 2022-06-08 DIAGNOSIS — F03911 Unspecified dementia, unspecified severity, with agitation: Secondary | ICD-10-CM | POA: Diagnosis present

## 2022-06-08 DIAGNOSIS — R471 Dysarthria and anarthria: Secondary | ICD-10-CM | POA: Diagnosis present

## 2022-06-08 DIAGNOSIS — I10 Essential (primary) hypertension: Secondary | ICD-10-CM | POA: Diagnosis present

## 2022-06-08 DIAGNOSIS — H409 Unspecified glaucoma: Secondary | ICD-10-CM | POA: Diagnosis present

## 2022-06-08 DIAGNOSIS — R410 Disorientation, unspecified: Secondary | ICD-10-CM

## 2022-06-08 DIAGNOSIS — Z79890 Hormone replacement therapy (postmenopausal): Secondary | ICD-10-CM | POA: Diagnosis not present

## 2022-06-08 DIAGNOSIS — F03B11 Unspecified dementia, moderate, with agitation: Secondary | ICD-10-CM | POA: Diagnosis not present

## 2022-06-08 DIAGNOSIS — Z66 Do not resuscitate: Secondary | ICD-10-CM | POA: Diagnosis present

## 2022-06-08 DIAGNOSIS — K219 Gastro-esophageal reflux disease without esophagitis: Secondary | ICD-10-CM | POA: Diagnosis present

## 2022-06-08 DIAGNOSIS — R338 Other retention of urine: Secondary | ICD-10-CM | POA: Diagnosis present

## 2022-06-08 DIAGNOSIS — Z515 Encounter for palliative care: Secondary | ICD-10-CM | POA: Diagnosis not present

## 2022-06-08 DIAGNOSIS — Z789 Other specified health status: Secondary | ICD-10-CM

## 2022-06-08 DIAGNOSIS — F02811 Dementia in other diseases classified elsewhere, unspecified severity, with agitation: Secondary | ICD-10-CM | POA: Diagnosis not present

## 2022-06-08 DIAGNOSIS — E079 Disorder of thyroid, unspecified: Secondary | ICD-10-CM | POA: Diagnosis present

## 2022-06-08 DIAGNOSIS — E876 Hypokalemia: Secondary | ICD-10-CM | POA: Diagnosis present

## 2022-06-08 DIAGNOSIS — N401 Enlarged prostate with lower urinary tract symptoms: Secondary | ICD-10-CM | POA: Diagnosis present

## 2022-06-08 DIAGNOSIS — Z981 Arthrodesis status: Secondary | ICD-10-CM | POA: Diagnosis not present

## 2022-06-08 DIAGNOSIS — F05 Delirium due to known physiological condition: Secondary | ICD-10-CM | POA: Diagnosis present

## 2022-06-08 DIAGNOSIS — F0392 Unspecified dementia, unspecified severity, with psychotic disturbance: Secondary | ICD-10-CM | POA: Diagnosis present

## 2022-06-08 DIAGNOSIS — A028 Other specified salmonella infections: Secondary | ICD-10-CM | POA: Diagnosis present

## 2022-06-08 DIAGNOSIS — E785 Hyperlipidemia, unspecified: Secondary | ICD-10-CM | POA: Diagnosis present

## 2022-06-08 DIAGNOSIS — G629 Polyneuropathy, unspecified: Secondary | ICD-10-CM | POA: Diagnosis present

## 2022-06-08 DIAGNOSIS — Z7189 Other specified counseling: Secondary | ICD-10-CM | POA: Diagnosis not present

## 2022-06-08 DIAGNOSIS — Z7401 Bed confinement status: Secondary | ICD-10-CM | POA: Diagnosis not present

## 2022-06-08 DIAGNOSIS — R29810 Facial weakness: Secondary | ICD-10-CM | POA: Diagnosis present

## 2022-06-08 DIAGNOSIS — I2510 Atherosclerotic heart disease of native coronary artery without angina pectoris: Secondary | ICD-10-CM | POA: Diagnosis present

## 2022-06-08 DIAGNOSIS — D6959 Other secondary thrombocytopenia: Secondary | ICD-10-CM | POA: Diagnosis present

## 2022-06-08 MED ORDER — LORAZEPAM 2 MG/ML PO CONC: 1.0000 mg | Freq: Three times a day (TID) | ORAL | Status: DC | PRN

## 2022-06-08 MED ORDER — LORAZEPAM 1 MG PO TABS: 1.0000 mg | ORAL_TABLET | Freq: Three times a day (TID) | ORAL | Status: DC | PRN

## 2022-06-08 MED ORDER — LORAZEPAM 2 MG/ML IJ SOLN
1.0000 mg | Freq: Three times a day (TID) | INTRAMUSCULAR | Status: DC | PRN
  Administered 2022-06-11: 1 mg via INTRAVENOUS
  Filled 2022-06-08: qty 1

## 2022-06-08 NOTE — Progress Notes (Signed)
FMTS Brief Progress Note  S: Sleeping in bed comfortably.  O: BP (!) 160/80 (BP Location: Left Arm)   Pulse 89   Temp 97.7 F (36.5 C) (Oral)   Resp 15   Wt 70.8 kg   SpO2 97%   BMI 23.73 kg/m   General: Sleeping comfortably, snoring Pulm: Normal work of breathing  A/P: Agitation 2/2 dementia Continue comfort measures as assigned by palliative medicine.  No change in plan as patient is comfortable and vitals stable. - Orders reviewed. Labs for AM not ordered  Wells Guiles, DO 06/08/2022, 10:41 PM PGY-2, [facility] Night Resident  Please page [phone] with questions.

## 2022-06-08 NOTE — Hospital Course (Addendum)
Brendan [name] is a 82 y.o.male with a history of CAD s/p CABG, hypertension, hyperlipidemia, ET s/p bilateral deep brain stimulator, history of intracerebral hemorrhage, stroke, peripheral neuropathy, memory loss who was admitted to the [facility] Medicine Teaching Service at [facility] for agitation. His hospital course is detailed below:  Agitation Previously admitted from 1/29-2/3 for encephalopathy and worsening dementia, due to poor prognosis family opted for home hospice services and comfort care. Admitted for agitation resulting in physical aggression towards family and healthcare team while receiving care at home. Palliative care was consulted and patient was started on comfort regimen that improved agitation. He was discharged to [facility] on 06/12/2022 for continued hospice care.  UTI Urinary retention During last admission, found to have UTI colonized with Salmonella requiring 6 weeks Amoxicillin per ID. UC confirmed >100,00 CFU of presumed Salmonella. Foley placed for patient comfort given urinary retention. Family opted to continue offering antibiotic is hopes of symptomatic relief during hospital course.

## 2022-06-08 NOTE — TOC Progression Note (Signed)
Transition of Care [facility]) - Progression Note    Patient Details  Name: Brendan [name] MRN: [id number] Date of Birth: May 07, 1940  Transition [facility] Care [facility] CM/SW [place], RN Phone Number: 06/08/2022, 11:34 AM  Clinical Narrative:    CM left message with Webb Silversmith, CM with [facility] for follow up regarding referral for Inpatient Hospice placement.  CM will continue to follow.   Expected Discharge Plan: [place] Barriers to Discharge: Continued Medical Work up  Expected Discharge Plan and Services   Discharge Planning Services: CM Consult Post Acute Care Choice: Residential Hospice Bed Living arrangements for the past 2 months: Single Family Home                                       Social Determinants of Health (SDOH) Interventions SDOH Screenings   Food Insecurity: No Food Insecurity (08/13/2019)  Housing: Low Risk  (08/13/2019)  Transportation Needs: No Transportation Needs (08/13/2019)  Alcohol Screen: Low Risk  (08/13/2019)  Depression (PHQ2-9): High Risk (01/04/2022)  Financial Resource Strain: Low Risk  (08/13/2019)  Physical Activity: Inactive (08/13/2019)  Social Connections: Moderately Integrated (08/13/2019)  Stress: No Stress Concern Present (08/13/2019)  Tobacco Use: Medium Risk (06/06/2022)    Readmission Risk Interventions     No data to display

## 2022-06-08 NOTE — Progress Notes (Signed)
Daily Progress Note   Patient Name: Brendan [name]       Date: 06/08/2022 DOB: Oct 12, 1940  Age: 82 y.o. MRN#: [id number] Attending Physician: McDiarmid, Blane Ohara, MD Primary Care Physician: Precious Gilding, DO Admit Date: 06/06/2022  Reason for Consultation/Follow-up: Non pain symptom management, Pain control, Psychosocial/spiritual support, and Terminal Care  Subjective: I have reviewed medical records including EPIC notes and labs. Received report from primary RN - no acute concerns. Reviewed patient's increased lethargy today in context of goals for comfort. Discussed use for foley catheter for EOL comfort. Patient has had one in/out cath and is continuing to retain urine per bladder stan this morning.  Went to visit patient at bedside - no family/visitors present. Patient was lying in bed asleep - I did not attempt to wake him to preserve comfort. No signs or non-verbal gestures of pain or discomfort noted. No respiratory distress, increased work of breathing, or secretions noted.   Discussed case with Dr. Jerilee Hoh.  Called patient's wife/Brendan [name] - emotional support provided. Reviewed medication adjustments made by primary team this morning. Reviewed goals for patient's discharge to residential hospice facility - she is very hopeful for this. Informed her that referral to HoP has already been initiated. Education provided on use of foley catheter at EOL - she is ok with placement as long as he "doesn't have to go home with one." Therapeutic listening provided as she reflects on trying to care for patient at home with foley (patient not aware of how to maneuver it) and she found it difficult trying to keep him from pulling it out.   All questions and concerns addressed. Encouraged to call with questions  and/or concerns. PMT card previously provided.  Length of Stay: 0  Current Medications: Scheduled Meds:   amoxicillin  1,000 mg Oral Q8H   pantoprazole  40 mg Oral Daily   QUEtiapine  50 mg Oral QHS   tamsulosin  0.4 mg Oral Daily   traZODone  100 mg Oral QHS    Continuous Infusions:   PRN Meds: acetaminophen **OR** acetaminophen, albuterol, antiseptic oral rinse, glycopyrrolate **OR** glycopyrrolate **OR** glycopyrrolate, haloperidol lactate, LORazepam **OR** LORazepam **OR** LORazepam, morphine injection, ondansetron **OR** ondansetron (ZOFRAN) IV, polyvinyl alcohol  Physical Exam Vitals and nursing note reviewed.  Constitutional:      General: He is not in  acute distress.    Appearance: He is ill-appearing.  Pulmonary:     Effort: No respiratory distress.  Skin:    General: Skin is warm and dry.  Neurological:     Mental Status: He is lethargic.     Motor: Weakness present.             Vital Signs: BP 127/67 (BP Location: Left Arm)   Pulse 65   Temp 98 F (36.7 C) (Axillary)   Resp 16   Wt 70.8 kg   SpO2 97%   BMI 23.73 kg/m  SpO2: SpO2: 97 % O2 Device: O2 Device: Room Air O2 Flow Rate:    Intake/output summary:  Intake/Output Summary (Last 24 hours) at 06/08/2022 1146 Last data filed at 06/08/2022 0000 Gross per 24 hour  Intake --  Output 800 ml  Net -800 ml   LBM:   Baseline Weight: Weight: 70.8 kg Most recent weight: Weight: 70.8 kg       Palliative Assessment/Data: PPS 10%      Patient Active Problem List   Diagnosis Date Noted   Altered mental status 06/07/2022   Dementia ([place]) 05/16/2022   Hypokalemia 05/16/2022   Encephalopathy 05/16/2022   Acute cystitis with hematuria 05/16/2022   Recurrent UTI 05/15/2022   Intertrigo 01/05/2022   Dark urine 01/04/2022   Right groin pain 07/06/2021   Cervical myelopathy (HCC) 11/30/2020   COPD (chronic obstructive pulmonary disease) ([place]) 04/01/2020   Tremor, essential 05/06/2019   Neuropathy  01/06/2019   Hyperkalemia 01/06/2019   Abrasion of right heel 11/28/2017   Tremor 11/13/2017   Hydrocele, bilateral 07/17/2017   Scrotal wall edema 07/17/2017   Anemia 07/17/2017   S/P deep brain stimulator placement    Epididymitis    Healthcare maintenance 07/02/2017   Labile blood pressure    Spastic hemiparesis affecting dominant side (HCC)    Thrombocytopenia (HCC)    Agitation    Labile blood glucose    History of Enterococcus UTI 05/29/2017   Neurogenic bladder    AKI (acute kidney injury) ([place])    Hemiplegia (HCC)    Sleep disturbance    Confusion, postoperative    Hypotension due to drugs    Acute blood loss anemia    Diabetes mellitus without complication (HCC)    Dysphagia    Intraparenchymal hemorrhage of brain ([place]) 05/18/2017   Weakness of right side of body    Aphasia    Intracerebral hemorrhage 05/16/2017   Cerebral edema (HCC)    Weakness 05/15/2017   Glaucoma    Anxiety state    Cognitive disorder    Urinary retention    Traumatic hemorrhage of left cerebrum without loss of consciousness (HCC)    Seborrheic dermatitis of scalp 10/05/2016   Allergic rhinitis 01/12/2014   Lower extremity edema 01/30/2013   S/P CABG x 3    Atherosclerotic heart disease of native coronary artery without angina pectoris    Hyperlipidemia LDL goal <70    Gait disorder 06/26/2012   RSD (reflex sympathetic dystrophy) 05/24/2012   Asbestosis ([place]) 05/15/2012   Anemia, B12 deficiency 03/19/2012   Coronary artery disease involving coronary bypass graft of native heart with angina pectoris ([place]) 09/16/2011   BPH (benign prostatic hyperplasia) 08/08/2011   Microscopic hematuria 02/27/2011   Anxiety 10/03/2010   Incontinence 07/21/2010   GERD 10/28/2007   Essential tremor 08/30/2006   Essential hypertension 07/06/2006    Palliative Care Assessment & Plan   Patient Profile: 82 y.o. male  with  past medical history of CAD, unstable angina, HTN, HLD, Dementia, and Thyroid  dysfunction admitted on 06/06/2022 with worsening agitation, confusion, facial droop.    Patient agitation and confusion have been chronic, recurrent, and escalating in severity recently.  Stroke workup was negative and no further workup was pursued given patient's hospice status. PMT has been consulted to assist with goals of care conversation regarding options for placement with hospice.  Assessment: Principal Problem:   Agitation Active Problems:   Recurrent UTI   Altered mental status   Terminal care  Recommendations/Plan: Continue full comfort measures Continue DNR/DNI as previously documented Residential hospice evaluation pending with [facility] foley catheter for end of life comfort Added orders for EOL symptom management and to reflect full comfort measures, as well as discontinued orders that were not focused on comfort Comfort medications adjusted by primary team this morning - will continue to make comfort medication adjustments/recommendations based on symptoms Nursing to provide frequent assessments and administer PRN medications as clinically necessary to ensure EOL comfort PMT will continue to follow and support holistically  Symptom Management Ativan 77m q8hr; PRN doses for breakthrough symptoms  Morphine PRN pain/dyspnea/increased work of breathing/RR>25 Tylenol PRN pain/fever Biotin twice daily Benadryl PRN itching Robinul PRN secretions Haldol PRN agitation/delirium Zofran PRN nausea/vomiting Liquifilm Tears PRN dry eye Albuterol PRN wheezing/shortness of breath Amoxicillin q8h for UTI symptoms Seroquel qhs Flomax daily Trazodone qhs  Goals of Care and Additional Recommendations: Limitations on Scope of Treatment: Full Comfort Care  Code Status:    Code Status Orders  (From admission, onward)           Start     Ordered   06/07/22 1054  Do not attempt resuscitation (DNR)  Continuous       Question Answer Comment  If patient  has no pulse and is not breathing Do Not Attempt Resuscitation   If patient has a pulse and/or is breathing: Medical Treatment Goals COMFORT MEASURES: Keep clean/warm/dry, use medication by any route; positioning, wound care and other measures to relieve pain/suffering; use oxygen, suction/manual treatment of airway obstruction for comfort; do not transfer unless for comfort needs.   Consent: Discussion documented in EHR or advanced directives reviewed      06/07/22 1128           Code Status History     Date Active Date Inactive Code Status Order ID Comments User Context   06/06/2022 2303 06/07/2022 1128 DNR [id number] BLeslie Dales DO ED   05/19/2022 1557 05/20/2022 2315 DNR [id number] HLuiz Ochoa NP Inpatient   05/16/2022 1410 05/19/2022 1557 DNR [id number] MDarci Current DO Inpatient   05/16/2022 1124 05/16/2022 1410 DNR [id number] YKarmen Bongo MD ED   11/30/2020 1518 12/01/2020 2151 Full Code [id number] SErline Levine MD Inpatient   11/13/2017 2012 11/14/2017 1419 Full Code [id number] SErline Levine MD Inpatient   07/17/2017 0025 07/19/2017 2137 Full Code [id number] ALovenia Kim MD Inpatient   05/18/2017 1612 06/09/2017 1445 Full Code [id number] LBary Leriche PA-C Inpatient   05/18/2017 1612 05/18/2017 1612 Full Code [id number] LBary Leriche PA-C Inpatient   05/18/2017 1612 05/18/2017 1612 Full Code [id number] ACathlyn Parsons PA-C Inpatient   05/18/2017 1612 05/18/2017 1612 Full Code [id number] ACathlyn Parsons PA-C Inpatient   05/15/2017 2348 05/18/2017 1550 Full Code [id number] FGuadalupe Dawn MD ED   05/04/2017 1642 05/11/2017 1800 Full Code [id number] SErline Levine MD Inpatient  Prognosis:  Poor  Discharge Planning: Hospice facility vs return home with hospice  Care plan was discussed with primary RN, Dr. Jerilee Hoh, patient's wife  Thank you for allowing the Palliative Medicine Team to assist in the care of this patient.  Lin Landsman, NP  Please contact Palliative  Medicine Team phone at [phone] for questions and concerns.   *Portions of this note are a verbal dictation therefore any spelling and/or grammatical errors are due to the "[place]" system interpretation.

## 2022-06-08 NOTE — Discharge Instructions (Signed)
Dear Brendan [name],   Thank you for letting us participate in your care! In this section, you will find a brief hospital admission summary of why you were admitted to the hospital, what happened during your admission, your diagnosis/diagnoses, and recommended follow up.  Primary diagnosis: Comfort care hospice services During [facility] hospital admission he received a regimen of medication to help ease his agitation and was placed in a facility that could continue his hospice care   Thank you for choosing [facility]!  [facility]  [address] [place],  [address] [phone]

## 2022-06-08 NOTE — Assessment & Plan Note (Addendum)
Salmonella UTI, susceptible to amoxicillin.  Continue with Foley amoxicillin for patient comfort. -Continue Amoxicillin through 06/26/22

## 2022-06-08 NOTE — Progress Notes (Addendum)
     Daily Progress Note Intern Pager: [phone]  Patient name: Brendan [name] Medical record number: [id number] Date of birth: 02-06-1941 Age: 82 y.o. Gender: male  Primary Care Provider: Precious Gilding, DO Consultants: Palliative care Code Status: DNR   Pt Overview and Major Events to Date:  2/20: Admitted to FMTS   Assessment and Plan: TEIGEN BORELLO is a 82 y.o. male presenting with agitation.  Past medical history includes dementia, CAD s/p CABG, hypertension, hyperlipidemia, ETD s/p bilateral deep brain stimulator, stroke, peripheral neuropathy and dementia.  * Agitation Somnolent upon exam today, likely secondary to medication effect. Palliative care discussed transition to hospice facility with family and recommend comfort care regimen. Will titrate regimen given patient mentation and assess for needs throughout the day -Palliative consult, appreciate recs -Comfort care measures -D/c scheduled Morphine and Ativan -Decreased to Ativan 26m q8h -Reassess q3h to determine comfort medication needs  Recurrent UTI H/o UTI in 03/2022 secondary to Salmonella requiring 6 weeks Amoxicillin treatment per ID. Per daughter, she would like to offer patient UTI treatment but not force it if he declines the medication. Family would like to be updated about urine culture results. Family prefers in and out cath for urinary retention but will revisit discussion of Foley for comfort. -F/u UC   FEN/GI: Dysphagia 3 diet PPx: Comfort care Dispo: Pending hospice placement  Subjective:  Patient assessed at bedside. He was not responsive to questions likely secondary to medication effect. Will update family in the afternoon.  Objective: Temp:  [97.6 F (36.4 C)-98 F (36.7 C)] 98 F (36.7 C) (02/22 0503) Pulse Rate:  [65-84] 65 (02/22 0503) Resp:  [16-18] 16 (02/22 0503) BP: (127-162)/(67-91) 127/67 (02/22 0503) SpO2:  [95 %-97 %] 97 % (02/22 0503) Physical Exam: General: Somnolent,  minimally responsive to painful stimuli. NAD Cardiovascular: RRR without murmur Respiratory: CTAB. Normal WOB on RA Abdomen: Soft, non-tender, non-distended Extremities: No peripheral edema  Laboratory: Most recent CBC Lab Results  Component Value Date   WBC 5.6 06/06/2022   HGB 12.9 (L) 06/06/2022   HCT 38.0 (L) 06/06/2022   MCV 92.3 06/06/2022   PLT 123 (L) 06/06/2022   Most recent BMP    Latest Ref Rng & Units 06/06/2022    4:20 PM  BMP  Glucose 70 - 99 mg/dL 133   BUN 8 - 23 mg/dL 9   Creatinine 0.61 - 1.24 mg/dL 0.90   Sodium 135 - 145 mmol/L 140   Potassium 3.5 - 5.1 mmol/L 3.3   Chloride 98 - 111 mmol/L 98      HColletta Maryland MD 06/08/2022, 11:56 AM  PGY-1, [place] pager: [phone] text pages welcome Secure chat group [place]

## 2022-06-09 DIAGNOSIS — R451 Restlessness and agitation: Secondary | ICD-10-CM | POA: Diagnosis not present

## 2022-06-09 DIAGNOSIS — F03911 Unspecified dementia, unspecified severity, with agitation: Secondary | ICD-10-CM | POA: Diagnosis not present

## 2022-06-09 DIAGNOSIS — Z515 Encounter for palliative care: Secondary | ICD-10-CM | POA: Diagnosis not present

## 2022-06-09 DIAGNOSIS — Z66 Do not resuscitate: Secondary | ICD-10-CM | POA: Diagnosis not present

## 2022-06-09 DIAGNOSIS — R410 Disorientation, unspecified: Secondary | ICD-10-CM | POA: Diagnosis not present

## 2022-06-09 NOTE — Progress Notes (Signed)
Daily Progress Note   Patient Name: Brendan [name]       Date: 06/09/2022 DOB: 02-Jun-1940  Age: 82 y.o. MRN#: [id number] Attending Physician: Dickie La, MD Primary Care Physician: Precious Gilding, DO Admit Date: 06/06/2022  Reason for Consultation/Follow-up: {Reason for Consult:23484}  Subjective: I have reviewed medical records including EPIC notes and labs. Received report from primary RN - ***  11:00 AM Attempted to call *** to discuss diagnosis, prognosis, GOC, EOL wishes, disposition, and options - no answer - confidential voicemail left and PMT phone number provided with request to return call.   All questions and concerns addressed. Encouraged to call with questions and/or concerns. PMT card provided.  Length of Stay: 1  Current Medications: Scheduled Meds:   amoxicillin  1,000 mg Oral Q8H   QUEtiapine  50 mg Oral QHS   tamsulosin  0.4 mg Oral Daily   traZODone  100 mg Oral QHS    Continuous Infusions:   PRN Meds: acetaminophen **OR** acetaminophen, albuterol, antiseptic oral rinse, glycopyrrolate **OR** glycopyrrolate **OR** glycopyrrolate, haloperidol lactate, LORazepam **OR** LORazepam **OR** LORazepam, morphine injection, ondansetron **OR** ondansetron (ZOFRAN) IV, polyvinyl alcohol  Physical Exam          Vital Signs: BP (!) 160/80 (BP Location: Left Arm)   Pulse 89   Temp 97.7 F (36.5 C) (Oral)   Resp 15   Wt 70.8 kg   SpO2 97%   BMI 23.73 kg/m  SpO2: SpO2: 97 % O2 Device: O2 Device: Room Air O2 Flow Rate:    Intake/output summary:  Intake/Output Summary (Last 24 hours) at 06/09/2022 1024 Last data filed at 06/08/2022 1200 Gross per 24 hour  Intake --  Output 650 ml  Net -650 ml   LBM:   Baseline Weight: Weight: 70.8 kg Most recent weight: Weight:  70.8 kg       Palliative Assessment/Data:      Patient Active Problem List   Diagnosis Date Noted   Altered mental status 06/07/2022   Dementia ([place]) 05/16/2022   Hypokalemia 05/16/2022   Encephalopathy 05/16/2022   Acute cystitis with hematuria 05/16/2022   Recurrent UTI 05/15/2022   Intertrigo 01/05/2022   Dark urine 01/04/2022   Right groin pain 07/06/2021   Cervical myelopathy ([place]) 11/30/2020   COPD (chronic obstructive pulmonary disease) ([place]) 04/01/2020   Tremor,  essential 05/06/2019   Neuropathy 01/06/2019   Hyperkalemia 01/06/2019   Abrasion of right heel 11/28/2017   Tremor 11/13/2017   Hydrocele, bilateral 07/17/2017   Scrotal wall edema 07/17/2017   Anemia 07/17/2017   S/P deep brain stimulator placement    Epididymitis    Healthcare maintenance 07/02/2017   Labile blood pressure    Spastic hemiparesis affecting dominant side (HCC)    Thrombocytopenia (HCC)    Agitation    Labile blood glucose    History of Enterococcus UTI 05/29/2017   Neurogenic bladder    AKI (acute kidney injury) ([place])    Hemiplegia (HCC)    Sleep disturbance    Confusion, postoperative    Hypotension due to drugs    Acute blood loss anemia    Diabetes mellitus without complication (HCC)    Dysphagia    Intraparenchymal hemorrhage of brain ([place]) 05/18/2017   Weakness of right side of body    Aphasia    Intracerebral hemorrhage 05/16/2017   Cerebral edema (HCC)    Weakness 05/15/2017   Glaucoma    Anxiety state    Cognitive disorder    Urinary retention    Traumatic hemorrhage of left cerebrum without loss of consciousness (HCC)    Seborrheic dermatitis of scalp 10/05/2016   Allergic rhinitis 01/12/2014   Lower extremity edema 01/30/2013   S/P CABG x 3    Atherosclerotic heart disease of native coronary artery without angina pectoris    Hyperlipidemia LDL goal <70    Gait disorder 06/26/2012   RSD (reflex sympathetic dystrophy) 05/24/2012   Asbestosis ([place]) 05/15/2012    Anemia, B12 deficiency 03/19/2012   Coronary artery disease involving coronary bypass graft of native heart with angina pectoris ([place]) 09/16/2011   BPH (benign prostatic hyperplasia) 08/08/2011   Microscopic hematuria 02/27/2011   Anxiety 10/03/2010   Incontinence 07/21/2010   GERD 10/28/2007   Essential tremor 08/30/2006   Essential hypertension 07/06/2006    Palliative Care Assessment & Plan   Patient Profile: ***  Assessment: Principal Problem:   Agitation Active Problems:   Recurrent UTI   Altered mental status   Recommendations/Plan: Continue full comfort measures Continue DNR/DNI as previously documented Transfer to [facility] when bed available Continue current comfort focused medication regimen - no changes Nursing to provide frequent assessments and administer PRN medications as clinically necessary to ensure EOL comfort PMT will continue to follow and support holistically   Symptom Management Ativan PRN  anxiety/distress Morphine PRN pain/dyspnea/increased work of breathing/RR>25 Tylenol PRN pain/fever Biotin twice daily Benadryl PRN itching Robinul PRN secretions Haldol PRN agitation/delirium Zofran PRN nausea/vomiting Liquifilm Tears PRN dry eye Albuterol PRN wheezing/shortness of breath Amoxicillin q8h for UTI symptoms Seroquel qhs Flomax daily Trazodone qhs  Goals of Care and Additional Recommendations: Limitations on Scope of Treatment: {Recommended Scope and Preferences:21019}  Code Status:    Code Status Orders  (From admission, onward)           Start     Ordered   06/07/22 1054  Do not attempt resuscitation (DNR)  Continuous       Question Answer Comment  If patient has no pulse and is not breathing Do Not Attempt Resuscitation   If patient has a pulse and/or is breathing: Medical Treatment Goals COMFORT MEASURES: Keep clean/warm/dry, use medication by any route; positioning, wound care and other measures to relieve  pain/suffering; use oxygen, suction/manual treatment of airway obstruction for comfort; do not transfer unless for comfort needs.  Consent: Discussion documented in EHR or advanced directives reviewed      06/07/22 1128           Code Status History     Date Active Date Inactive Code Status Order ID Comments User Context   06/06/2022 2303 06/07/2022 1128 DNR [id number]  [name], DO ED   05/19/2022 1557 05/20/2022 2315 DNR [id number]  Luiz Ochoa, NP Inpatient   05/16/2022 1410 05/19/2022 1557 DNR [id number]  Darci Current, DO Inpatient   05/16/2022 1124 05/16/2022 1410 DNR [id number]  Karmen Bongo, MD ED   11/30/2020 1518 12/01/2020 2151 Full Code [id number]  Erline Levine, MD Inpatient   11/13/2017 2012 11/14/2017 1419 Full Code [id number]  Erline Levine, MD Inpatient   07/17/2017 0025 07/19/2017 2137 Full Code [id number]  Lovenia Kim, MD Inpatient   05/18/2017 1612 06/09/2017 1445 Full Code [id number]  Bary Leriche, PA-C Inpatient   05/18/2017 1612 05/18/2017 1612 Full Code [id number]  Bary Leriche, PA-C Inpatient   05/18/2017 1612 05/18/2017 1612 Full Code [id number]  Cathlyn Parsons, PA-C Inpatient   05/18/2017 1612 05/18/2017 1612 Full Code [id number]  Cathlyn Parsons, PA-C Inpatient   05/15/2017 2348 05/18/2017 1550 Full Code [id number]  Guadalupe Dawn, MD ED   05/04/2017 1642 05/11/2017 1800 Full Code [id number]  Erline Levine, MD Inpatient       Prognosis:  {Palliative Care Prognosis:23504}  Discharge Planning: {Palliative dispostion:23505}  Care plan was discussed with ***  Thank you for allowing the Palliative Medicine Team to assist in the care of this patient.   Time In: *** Time Out: *** Total Time *** Prolonged Time Billed  {YES NO:22349}       Greater than 50%  of this time was spent counseling and coordinating care related to the above assessment and plan.  Lin Landsman, NP  Please contact Palliative Medicine Team phone at [phone] for questions and concerns.    *Portions of this note are a verbal dictation therefore any spelling and/or grammatical errors are due to the "[place] [employer]" system interpretation.

## 2022-06-09 NOTE — Progress Notes (Signed)
     Daily Progress Note Intern Pager: [phone]  Patient name: Brendan [name] Medical record number: [id number] Date of birth: Apr 09, 1941 Age: 82 y.o. Gender: male  Primary Care Provider: Precious Gilding, DO Consultants: Palliative care Code Status: DNR   Pt Overview and Major Events to Date:  2/20: Admitted to FMTS   Assessment and Plan: Brendan [name] is a 82 y.o. male presenting with agitation.  Past medical history includes dementia, CAD s/p CABG, hypertension, hyperlipidemia, ETD s/p bilateral deep brain stimulator, stroke, peripheral neuropathy and dementia.  * Agitation Patient resting peacefully all day yesterday with q3h physician checks and calm upon exam today. No indication for adding back Ativan or Morphine but will continue to reassess as needed. Pending [facility] at [facility]. No recorded PO intake since admission. -Palliative consult, appreciate recs -Comfort care measures  Recurrent UTI Per Palliative discussion with family, continue with Foley for patient comfort. No signs of pain or discomfort from UTI upon exam. Consider pain management with Morphine if discomfort. UC confirmed >100,000 CFU or presumed Salmonella that is susceptible to Amoxicillin. -Continue to offer Amoxicillin for potential symptomatic relief   FEN/GI: Dysphagia 3 diet PPx: Comfort care Dispo: Pending hospice placement  Subjective:  Patient assessed at bedside, resting comfortably with eyes closed. Moved his head to moderate touch. No grimacing noted.  Objective: Temp:  [97.7 F (36.5 C)] 97.7 F (36.5 C) (02/22 1958) Pulse Rate:  [89] 89 (02/22 1958) Resp:  [15] 15 (02/22 1958) BP: (160)/(80) 160/80 (02/22 1958) SpO2:  [97 %] 97 % (02/22 1958) Physical Exam: General: Resting comfortably in bed. NAD Cardiovascular: RRR without murmur Respiratory: CTAB. Normal WOB on RA Abdomen: Soft, non-tender, non-distended Extremities: No peripheral edema  Laboratory: Most  recent CBC Lab Results  Component Value Date   WBC 5.6 06/06/2022   HGB 12.9 (L) 06/06/2022   HCT 38.0 (L) 06/06/2022   MCV 92.3 06/06/2022   PLT 123 (L) 06/06/2022   Most recent BMP    Latest Ref Rng & Units 06/06/2022    4:20 PM  BMP  Glucose 70 - 99 mg/dL 133   BUN 8 - 23 mg/dL 9   Creatinine 0.61 - 1.24 mg/dL 0.90   Sodium 135 - 145 mmol/L 140   Potassium 3.5 - 5.1 mmol/L 3.3   Chloride 98 - 111 mmol/L 98     Colletta Maryland, MD 06/09/2022, 9:33 AM  PGY-1, [place] Intern pager: [phone], text pages welcome Secure chat group [place]

## 2022-06-09 NOTE — Discharge Summary (Shared)
[facility] Discharge Summary  Patient name: Brendan [name] Medical record number: [id number] Date of birth: July 04, 1940 Age: 82 y.o. Gender: male Date of Admission: 06/06/2022  Date of Discharge: 06/12/2022 Admitting Physician: [name], DO  Primary Care Provider: Precious Gilding, DO Consultants: Palliative care  Indication for Hospitalization: Agitation  Discharge Diagnoses/Problem List:  Principal Problem for Admission: Agitation Other Problems addressed during stay:  Principal Problem:   Agitation Active Problems:   Recurrent UTI   Dementia due to general medical condition, with agitation [facility])   Altered mental status  Brief Hospital Course:  Brendan [name] is a 82 y.o.male with a history of CAD s/p CABG, hypertension, hyperlipidemia, ET s/p bilateral deep brain stimulator, history of intracerebral hemorrhage, stroke, peripheral neuropathy, memory loss who was admitted to the [facility] Medicine Teaching Service at [facility] for agitation. His hospital course is detailed below:  Agitation Previously admitted from 1/29-2/3 for encephalopathy and worsening dementia, due to poor prognosis family opted for home hospice services and comfort care. Admitted for agitation resulting in physical aggression towards family and healthcare team while receiving care at home. Palliative care was consulted and patient was started on comfort regimen that improved agitation. He was discharged to [place] on 06/12/2022 for continued hospice care.  UTI Urinary retention During last admission, found to have UTI colonized with Salmonella requiring 6 weeks Amoxicillin per ID. UC confirmed >100,00 CFU of presumed Salmonella. Foley placed for patient comfort given urinary retention. Family opted to continue offering antibiotic is hopes of symptomatic relief during hospital course.  Disposition: Hospice  Discharge Condition: Comfort care   Discharge Exam:   Vitals:   06/12/22 0431 06/12/22 0750  BP: 112/69 (!) 144/59  Pulse: 84 (!) 36  Resp: 20   Temp: 98.9 F (37.2 C) 98.5 F (36.9 C)  SpO2:  96%   Exam: General: NAD, resting comfortably in bed Cardiovascular: RRR, no murmurs Respiratory: CTAB, normal breathing on room air Abdomen: Soft, nontender, nondistended.  Bowel sounds present  Significant Procedures: None  Pertinent Imaging: None  Results/Tests Pending at Time of Discharge: None  Discharge Medications:  Allergies as of 06/12/2022       Reactions   Lisinopril Cough        Medication List     STOP taking these medications    albuterol 108 (90 Base) MCG/ACT inhaler Commonly known as: VENTOLIN HFA   CLEAR EYES COMPLETE OP   fluticasone 50 MCG/ACT nasal spray Commonly known as: FLONASE   glycopyrrolate 1 MG tablet Commonly known as: ROBINUL   haloperidol 1 MG tablet Commonly known as: HALDOL   loratadine 10 MG tablet Commonly known as: Allergy Relief   nitroGLYCERIN 0.4 MG SL tablet Commonly known as: NITROSTAT   nystatin cream Commonly known as: MYCOSTATIN   omeprazole 20 MG capsule Commonly known as: PRILOSEC   ondansetron 4 MG disintegrating tablet Commonly known as: ZOFRAN-ODT   polyethylene glycol 17 g packet Commonly known as: MIRALAX / GLYCOLAX   Primidar Caps       TAKE these medications    amoxicillin 500 MG capsule Commonly known as: AMOXIL Take 2 capsules (1,000 mg total) by mouth every 8 (eight) hours for 110 doses.   HYDROcodone-acetaminophen 5-325 MG tablet Commonly known as: NORCO/VICODIN Take 1 tablet by mouth every 6 (six) hours as needed for moderate pain.   LORazepam 1 MG tablet Commonly known as: ATIVAN Take 1 mg by mouth every 6 (six) hours as needed (  for agitation).   morphine CONCENTRATE 10 MG/0.5ML Soln concentrated solution Take 0.25 mLs (5 mg total) by mouth every 2 (two) hours as needed for moderate pain (or dyspnea).   QUEtiapine 50 MG  tablet Commonly known as: SEROQUEL Take 50 mg by mouth at bedtime. What changed: Another medication with the same name was removed. Continue taking this medication, and follow the directions you see here.   tamsulosin 0.4 MG Caps capsule Commonly known as: FLOMAX Take 1 capsule (0.4 mg total) by mouth daily.   traZODone 100 MG tablet Commonly known as: DESYREL TAKE 1 TABLET BY MOUTH EACH NIGHT AT BEDTIME What changed: See the new instructions.        Discharge Instructions: Please refer to Patient Instructions section of EMR for full details.  Patient was counseled important signs and symptoms that should prompt return to medical care, changes in medications, dietary instructions, activity restrictions, and follow up appointments.   Follow-Up Appointments: None  [name], [place] 06/12/2022, 12:29 PM PGY-1, [place]

## 2022-06-10 DIAGNOSIS — R410 Disorientation, unspecified: Secondary | ICD-10-CM | POA: Diagnosis not present

## 2022-06-10 DIAGNOSIS — F02811 Dementia in other diseases classified elsewhere, unspecified severity, with agitation: Secondary | ICD-10-CM

## 2022-06-10 DIAGNOSIS — Z515 Encounter for palliative care: Secondary | ICD-10-CM | POA: Diagnosis not present

## 2022-06-10 DIAGNOSIS — R451 Restlessness and agitation: Secondary | ICD-10-CM | POA: Diagnosis not present

## 2022-06-10 DIAGNOSIS — Z66 Do not resuscitate: Secondary | ICD-10-CM | POA: Diagnosis not present

## 2022-06-10 NOTE — Progress Notes (Signed)
Daily Progress Note   Patient Name: Brendan [name]       Date: 06/10/2022 DOB: [birth year]  Age: 82 y.o. MRN#: [id number] Attending Physician: Dickie La, MD Primary Care Physician: Precious Gilding, DO Admit Date: 06/06/2022  Reason for Consultation/Follow-up: Non pain symptom management, Pain control, Psychosocial/spiritual support, and Terminal Care  Subjective: I have reviewed medical records including EPIC notes and labs. Received report from primary RN -no acute concerns.  RN reports patient is not eating/drinking.  Went to visit patient at bedside-no family/visitors present.  Patient was lying in bed -his eyes were open, he does not verbally communicate. No signs or non-verbal gestures of pain or discomfort noted. No respiratory distress, increased work of breathing, or secretions noted.    Length of Stay: 2  Current Medications: Scheduled Meds:   amoxicillin  1,000 mg Oral Q8H   QUEtiapine  50 mg Oral QHS   tamsulosin  0.4 mg Oral Daily   traZODone  100 mg Oral QHS    Continuous Infusions:   PRN Meds: acetaminophen **OR** acetaminophen, albuterol, antiseptic oral rinse, glycopyrrolate **OR** glycopyrrolate **OR** glycopyrrolate, haloperidol lactate, LORazepam **OR** LORazepam **OR** LORazepam, morphine injection, ondansetron **OR** ondansetron (ZOFRAN) IV, polyvinyl alcohol  Physical Exam Vitals and nursing note reviewed.  Constitutional:      General: He is not in acute distress.    Appearance: He is ill-appearing.  Pulmonary:     Effort: No respiratory distress.  Skin:    General: Skin is warm and dry.  Neurological:     Mental Status: He is lethargic.     Motor: Weakness present.             Vital Signs: BP (!) 156/75 (BP Location: Left Arm)   Pulse 91   Temp 97.7  F (36.5 C)   Resp 15   Wt 70.8 kg   SpO2 96%   BMI 23.73 kg/m  SpO2: SpO2: 96 % O2 Device: O2 Device: Room Air O2 Flow Rate:    Intake/output summary:  Intake/Output Summary (Last 24 hours) at 06/10/2022 1001 Last data filed at 06/09/2022 1800 Gross per 24 hour  Intake 5 ml  Output 0 ml  Net 5 ml   LBM:   Baseline Weight: Weight: 70.8 kg Most recent weight: Weight: 70.8 kg       Palliative  Assessment/Data: PPS 10%      Patient Active Problem List   Diagnosis Date Noted   Altered mental status 06/07/2022   Dementia ([place]) 05/16/2022   Hypokalemia 05/16/2022   Encephalopathy 05/16/2022   Acute cystitis with hematuria 05/16/2022   Recurrent UTI 05/15/2022   Intertrigo 01/05/2022   Dark urine 01/04/2022   Right groin pain 07/06/2021   Cervical myelopathy ([place]) 11/30/2020   COPD (chronic obstructive pulmonary disease) ([place]) 04/01/2020   Tremor, essential 05/06/2019   Neuropathy 01/06/2019   Hyperkalemia 01/06/2019   Abrasion of right heel 11/28/2017   Tremor 11/13/2017   Hydrocele, bilateral 07/17/2017   Scrotal wall edema 07/17/2017   Anemia 07/17/2017   S/P deep brain stimulator placement    Epididymitis    Healthcare maintenance 07/02/2017   Labile blood pressure    Spastic hemiparesis affecting dominant side (HCC)    Thrombocytopenia (HCC)    Agitation    Labile blood glucose    History of Enterococcus UTI 05/29/2017   Neurogenic bladder    AKI (acute kidney injury) ([place])    Hemiplegia (HCC)    Sleep disturbance    Confusion, postoperative    Hypotension due to drugs    Acute blood loss anemia    Diabetes mellitus without complication ([place])    Dysphagia    Intraparenchymal hemorrhage of brain ([place]) 05/18/2017   Weakness of right side of body    Aphasia    Intracerebral hemorrhage 05/16/2017   Cerebral edema (HCC)    Weakness 05/15/2017   Glaucoma    Anxiety state    Cognitive disorder    Urinary retention    Traumatic hemorrhage of left  cerebrum without loss of consciousness (HCC)    Seborrheic dermatitis of scalp 10/05/2016   Allergic rhinitis 01/12/2014   Lower extremity edema 01/30/2013   S/P CABG x 3    Atherosclerotic heart disease of native coronary artery without angina pectoris    Hyperlipidemia LDL goal <70    Gait disorder 06/26/2012   RSD (reflex sympathetic dystrophy) 05/24/2012   Asbestosis ([place]) 05/15/2012   Anemia, B12 deficiency 03/19/2012   Coronary artery disease involving coronary bypass graft of native heart with angina pectoris ([place]) 09/16/2011   BPH (benign prostatic hyperplasia) 08/08/2011   Microscopic hematuria 02/27/2011   Anxiety 10/03/2010   Incontinence 07/21/2010   GERD 10/28/2007   Essential tremor 08/30/2006   Essential hypertension 07/06/2006    Palliative Care Assessment & Plan   Patient Profile: 82 y.o. male  with past medical history of CAD, unstable angina, HTN, HLD, Dementia, and Thyroid dysfunction admitted on 06/06/2022 with worsening agitation, confusion, facial droop.    Patient agitation and confusion have been chronic, recurrent, and escalating in severity recently.  Stroke workup was negative and no further workup was pursued given patient's hospice status. PMT has been consulted to assist with goals of care conversation regarding options for placement with hospice.  Assessment: Principal Problem:   Agitation Active Problems:   Recurrent UTI   Altered mental status   Terminal care  Recommendations/Plan: Continue full comfort measures Continue DNR/DNI as previously documented Transfer to [place] when bed available Continue current comfort focused medication regimen - no changes Nursing to provide frequent assessments and administer PRN medications as clinically necessary to ensure EOL comfort PMT will continue to follow and support holistically   Symptom Management Ativan PRN  anxiety/distress Morphine PRN pain/dyspnea/increased work of  breathing/RR>25 Tylenol PRN pain/fever Biotin twice daily Benadryl PRN itching Robinul  PRN secretions Haldol PRN agitation/delirium Zofran PRN nausea/vomiting Liquifilm Tears PRN dry eye Albuterol PRN wheezing/shortness of breath Amoxicillin q8h for UTI symptoms Seroquel qhs Flomax daily Trazodone qhs   Goals of Care and Additional Recommendations: Limitations on Scope of Treatment: Full Comfort Care  Code Status:    Code Status Orders  (From admission, onward)           Start     Ordered   06/07/22 1054  Do not attempt resuscitation (DNR)  Continuous       Question Answer Comment  If patient has no pulse and is not breathing Do Not Attempt Resuscitation   If patient has a pulse and/or is breathing: Medical Treatment Goals COMFORT MEASURES: Keep clean/warm/dry, use medication by any route; positioning, wound care and other measures to relieve pain/suffering; use oxygen, suction/manual treatment of airway obstruction for comfort; do not transfer unless for comfort needs.   Consent: Discussion documented in EHR or advanced directives reviewed      06/07/22 1128           Code Status History     Date Active Date Inactive Code Status Order ID Comments User Context   06/06/2022 2303 06/07/2022 1128 DNR [id number]  [name], DO ED   05/19/2022 1557 05/20/2022 2315 DNR [id number]  Luiz Ochoa, NP Inpatient   05/16/2022 1410 05/19/2022 1557 DNR [id number]  Darci Current, DO Inpatient   05/16/2022 1124 05/16/2022 1410 DNR [id number]  Karmen Bongo, MD ED   11/30/2020 1518 12/01/2020 2151 Full Code [id number]  Erline Levine, MD Inpatient   11/13/2017 2012 11/14/2017 1419 Full Code [id number]  Erline Levine, MD Inpatient   07/17/2017 0025 07/19/2017 2137 Full Code [id number]  Lovenia Kim, MD Inpatient   05/18/2017 1612 06/09/2017 1445 Full Code [id number]  Bary Leriche, PA-C Inpatient   05/18/2017 1612 05/18/2017 1612 Full Code [id number]  Bary Leriche, PA-C Inpatient   05/18/2017 1612  05/18/2017 1612 Full Code [id number]  Cathlyn Parsons, PA-C Inpatient   05/18/2017 1612 05/18/2017 1612 Full Code [id number]  Cathlyn Parsons, PA-C Inpatient   05/15/2017 2348 05/18/2017 1550 Full Code [id number]  Guadalupe Dawn, MD ED   05/04/2017 1642 05/11/2017 1800 Full Code [id number]  Erline Levine, MD Inpatient       Prognosis:  < 2 weeks  Discharge Planning: Hospice facility  Care plan was discussed with primary RN  Thank you for allowing the Palliative Medicine Team to assist in the care of this patient.   Lin Landsman, NP  Please contact Palliative Medicine Team phone at [phone] for questions and concerns.   *Portions of this note are a verbal dictation therefore any spelling and/or grammatical errors are due to the "[place] [employer]" system interpretation.

## 2022-06-10 NOTE — Progress Notes (Signed)
     Daily Progress Note Intern Pager: [phone]  Patient name: Brendan [name] Medical record number: [id number] Date of birth: [birth year] Age: 82 y.o. Gender: male  Primary Care Provider: Precious Gilding, DO Consultants: Palliative care Code Status: DNR  Pt Overview and Major Events to Date:  2/20: Admitted  Assessment and Plan: RAJIV SUPPES is a 82 y.o. male presenting with agitation.  Past medical history includes dementia, CAD s/p CABG, hypertension, hyperlipidemia, ETD s/p bilateral deep brain stimulator, stroke, peripheral neuropathy and dementia.   * Agitation Patient continues to rest peacefully.  Do not see indication for adding back morphine, will continue to reassess.  Pending placement at [facility].  No reported p.o. intake since admission. -Palliative consult, appreciate recs -Comfort care measures  Recurrent UTI Per Palliative discussion with family, continue with Foley for patient comfort. No signs of pain or discomfort from UTI upon exam. Consider pain management with Morphine if discomfort. UC confirmed >100,000 CFU or presumed Salmonella that is susceptible to Amoxicillin. -Continue to offer Amoxicillin for potential symptomatic relief   FEN/GI: Dysphagia 3 diet PPx: Comfort care Dispo: Pending hospice placement  Subjective:  Resting comfortably in bed with eyes closed.  Opens eyes and moves head when touched.  Falls back asleep, but arousable.  Objective: Temp:  [98.4 F (36.9 C)-98.7 F (37.1 C)] 98.7 F (37.1 C) (02/24 0524) Pulse Rate:  [82-101] 99 (02/24 0524) Resp:  [15-17] 17 (02/24 0524) BP: (148-182)/(67-73) 182/73 (02/24 0524) SpO2:  [95 %-98 %] 95 % (02/24 0524) Physical Exam: General: Resting comfortably bed, NAD Cardiovascular: RRR no murmurs Respiratory: CTAB, normal breathing on room air Abdomen: Soft, nontender, non-distended Extremities: No peripheral edema  Laboratory: Most recent CBC Lab Results  Component Value Date    WBC 5.6 06/06/2022   HGB 12.9 (L) 06/06/2022   HCT 38.0 (L) 06/06/2022   MCV 92.3 06/06/2022   PLT 123 (L) 06/06/2022   Most recent BMP    Latest Ref Rng & Units 06/06/2022    4:20 PM  BMP  Glucose 70 - 99 mg/dL 133   BUN 8 - 23 mg/dL 9   Creatinine 0.61 - 1.24 mg/dL 0.90   Sodium 135 - 145 mmol/L 140   Potassium 3.5 - 5.1 mmol/L 3.3   Chloride 98 - 111 mmol/L 98     [name], DO 06/10/2022, 5:26 AM  PGY-1, [place] Intern pager: [phone], text pages welcome Secure chat group [place]

## 2022-06-10 NOTE — Progress Notes (Signed)
This RN had a difficult time giving pt.his medications last night and unable to give this a.m. Attempted with applesauce , pudding and dissolved in water. Pt.closes mouth shut and wont open at all. Pt.daughter is aware.

## 2022-06-11 DIAGNOSIS — Z515 Encounter for palliative care: Secondary | ICD-10-CM | POA: Diagnosis not present

## 2022-06-11 DIAGNOSIS — Z66 Do not resuscitate: Secondary | ICD-10-CM | POA: Diagnosis not present

## 2022-06-11 DIAGNOSIS — F02811 Dementia in other diseases classified elsewhere, unspecified severity, with agitation: Secondary | ICD-10-CM | POA: Diagnosis not present

## 2022-06-11 DIAGNOSIS — R451 Restlessness and agitation: Secondary | ICD-10-CM | POA: Diagnosis not present

## 2022-06-11 DIAGNOSIS — R410 Disorientation, unspecified: Secondary | ICD-10-CM | POA: Diagnosis not present

## 2022-06-11 LAB — GLUCOSE, CAPILLARY: Glucose-Capillary: 142 mg/dL — ABNORMAL HIGH (ref 70–99)

## 2022-06-11 MED ORDER — MORPHINE SULFATE (PF) 2 MG/ML IV SOLN
1.0000 mg | Freq: Three times a day (TID) | INTRAVENOUS | Status: DC
  Administered 2022-06-11 – 2022-06-12 (×3): 1 mg via INTRAVENOUS
  Filled 2022-06-11 (×3): qty 1

## 2022-06-11 NOTE — Progress Notes (Signed)
I have had a lengthy discussion with pt daughter and wife today about pt going to [facility].  They are in agreement for patient to go there. We do not have a bed available today.  During the conversation his daughter did voice concern that patient may be having pain and is not able to verbalize it. She asked if he could have IV pain medications ordered routinely so he would not have to ask for it.  Informed her I will ask the provider if this could be done. We will continue to follow patient until bed is available at [facility]. Feel free to call with any questions or concerns. Lin Landsman, RN BSN [place] [facility] [place] [phone].

## 2022-06-11 NOTE — Progress Notes (Addendum)
Daily Progress Note   Patient Name: Brendan [name]       Date: 06/11/2022 DOB: 1940/04/27  Age: 82 y.o. MRN#: [id number] Attending Physician: Dickie La, MD Primary Care Physician: Precious Gilding, DO Admit Date: 06/06/2022  Reason for Consultation/Follow-up: Non pain symptom management, Pain control, Psychosocial/spiritual support, and Terminal Care  Subjective: I have reviewed medical records including EPIC notes and labs. Received report from primary RN -no acute concerns.  RN reports patient remains lethargic, not eating/drinking.  RN has already spoken with family and provided updates per his assessment.  Went to visit patient at bedside - no family/visitors present.  Patient was lying in bed asleep - I did not attempt to wake him to preserve comfort.  No signs or non-verbal gestures of pain or discomfort noted. No respiratory distress, increased work of breathing, or secretions noted.   Disposition still pending bed availability with [facility].   Length of Stay: 3  Current Medications: Scheduled Meds:   amoxicillin  1,000 mg Oral Q8H   QUEtiapine  50 mg Oral QHS   tamsulosin  0.4 mg Oral Daily   traZODone  100 mg Oral QHS    Continuous Infusions:   PRN Meds: acetaminophen **OR** acetaminophen, albuterol, antiseptic oral rinse, glycopyrrolate **OR** glycopyrrolate **OR** glycopyrrolate, haloperidol lactate, LORazepam **OR** LORazepam **OR** LORazepam, morphine injection, ondansetron **OR** ondansetron (ZOFRAN) IV, polyvinyl alcohol  Physical Exam Vitals and nursing note reviewed.  Constitutional:      General: He is not in acute distress.    Appearance: He is ill-appearing.  Pulmonary:     Effort: No respiratory distress.  Skin:    General: Skin is warm and  dry.  Neurological:     Mental Status: He is lethargic.     Motor: Weakness present.             Vital Signs: BP 131/66 (BP Location: Left Arm)   Pulse 84   Temp (!) 100.7 F (38.2 C) (Axillary)   Resp 16   Wt 70.8 kg   SpO2 94%   BMI 23.73 kg/m  SpO2: SpO2: 94 % O2 Device: O2 Device: Room Air O2 Flow Rate:    Intake/output summary:  Intake/Output Summary (Last 24 hours) at 06/11/2022 1128 Last data filed at 06/10/2022 1500 Gross per 24 hour  Intake --  Output 650 ml  Net -650 ml   LBM:   Baseline Weight: Weight: 70.8 kg Most recent weight: Weight: 70.8 kg       Palliative Assessment/Data: PPS 10%      Patient Active Problem List   Diagnosis Date Noted   Altered mental status 06/07/2022   Dementia due to general medical condition, with agitation ([place]) 05/16/2022   Hypokalemia 05/16/2022   Encephalopathy 05/16/2022   Acute cystitis with hematuria 05/16/2022   Recurrent UTI 05/15/2022   Intertrigo 01/05/2022   Dark urine 01/04/2022   Right groin pain 07/06/2021   Cervical myelopathy ([place]) 11/30/2020   COPD (chronic obstructive pulmonary disease) ([place]) 04/01/2020   Tremor, essential 05/06/2019   Neuropathy 01/06/2019   Hyperkalemia 01/06/2019   Abrasion of right heel 11/28/2017   Tremor 11/13/2017   Hydrocele, bilateral 07/17/2017   Scrotal wall edema 07/17/2017   Anemia 07/17/2017   S/P deep brain stimulator placement    Epididymitis    Healthcare maintenance 07/02/2017   Labile blood pressure    Spastic hemiparesis affecting dominant side (HCC)    Thrombocytopenia (HCC)    Agitation    Labile blood glucose    History of Enterococcus UTI 05/29/2017   Neurogenic bladder    AKI (acute kidney injury) ([place])    Hemiplegia (HCC)    Sleep disturbance    Confusion, postoperative    Hypotension due to drugs    Acute blood loss anemia    Diabetes mellitus without complication ([place])    Dysphagia    Intraparenchymal hemorrhage of brain ([place]) 05/18/2017    Weakness of right side of body    Aphasia    Intracerebral hemorrhage 05/16/2017   Cerebral edema (HCC)    Weakness 05/15/2017   Glaucoma    Anxiety state    Cognitive disorder    Urinary retention    Traumatic hemorrhage of left cerebrum without loss of consciousness (HCC)    Seborrheic dermatitis of scalp 10/05/2016   Allergic rhinitis 01/12/2014   Lower extremity edema 01/30/2013   S/P CABG x 3    Atherosclerotic heart disease of native coronary artery without angina pectoris    Hyperlipidemia LDL goal <70    Gait disorder 06/26/2012   RSD (reflex sympathetic dystrophy) 05/24/2012   Asbestosis ([place]) 05/15/2012   Anemia, B12 deficiency 03/19/2012   Coronary artery disease involving coronary bypass graft of native heart with angina pectoris ([place]) 09/16/2011   BPH (benign prostatic hyperplasia) 08/08/2011   Microscopic hematuria 02/27/2011   Anxiety 10/03/2010   Incontinence 07/21/2010   GERD 10/28/2007   Essential tremor 08/30/2006   Essential hypertension 07/06/2006    Palliative Care Assessment & Plan   Patient Profile: 82 y.o. male  with past medical history of CAD, unstable angina, HTN, HLD, Dementia, and Thyroid dysfunction admitted on 06/06/2022 with worsening agitation, confusion, facial droop.    Patient agitation and confusion have been chronic, recurrent, and escalating in severity recently.  Stroke workup was negative and no further workup was pursued given patient's hospice status. PMT has been consulted to assist with goals of care conversation regarding options for placement with hospice.  Assessment: Principal Problem:   Agitation Active Problems:   Recurrent UTI   Dementia due to general medical condition, with agitation ([place])   Altered mental status   Recommendations/Plan: Continue full comfort measures Continue DNR/DNI as previously documented Transfer to [facility] when bed available Continue current comfort focused medication regimen  - no changes Nursing to provide  frequent assessments and administer PRN medications as clinically necessary to ensure EOL comfort PMT will continue to follow and support holistically   Symptom Management Ativan PRN  anxiety/distress Morphine PRN pain/dyspnea/increased work of breathing/RR>25 Tylenol PRN pain/fever Biotin twice daily Benadryl PRN itching Robinul PRN secretions Haldol PRN agitation/delirium Zofran PRN nausea/vomiting Liquifilm Tears PRN dry eye Albuterol PRN wheezing/shortness of breath Amoxicillin q8h for UTI symptoms Seroquel qhs Flomax daily Trazodone qhs  4:19 PM **ADDENDUM** Notified by hospice liaison that family expressed concerns that patient is having pain (tears in eyes) - they are requesting scheduled medication.  Scheduled morphine q8h   Goals of Care and Additional Recommendations: Limitations on Scope of Treatment: Full Comfort Care  Code Status:    Code Status Orders  (From admission, onward)           Start     Ordered   06/07/22 1054  Do not attempt resuscitation (DNR)  Continuous       Question Answer Comment  If patient has no pulse and is not breathing Do Not Attempt Resuscitation   If patient has a pulse and/or is breathing: Medical Treatment Goals COMFORT MEASURES: Keep clean/warm/dry, use medication by any route; positioning, wound care and other measures to relieve pain/suffering; use oxygen, suction/manual treatment of airway obstruction for comfort; do not transfer unless for comfort needs.   Consent: Discussion documented in EHR or advanced directives reviewed      06/07/22 1128           Code Status History     Date Active Date Inactive Code Status Order ID Comments User Context   06/06/2022 2303 06/07/2022 1128 DNR [id number]  [name], DO ED   05/19/2022 1557 05/20/2022 2315 DNR [id number]  Luiz Ochoa, NP Inpatient   05/16/2022 1410 05/19/2022 1557 DNR [id number]  Darci Current, DO Inpatient   05/16/2022 1124  05/16/2022 1410 DNR [id number]  Karmen Bongo, MD ED   11/30/2020 1518 12/01/2020 2151 Full Code [id number]  Erline Levine, MD Inpatient   11/13/2017 2012 11/14/2017 1419 Full Code [id number]  Erline Levine, MD Inpatient   07/17/2017 0025 07/19/2017 2137 Full Code [id number]  Lovenia Kim, MD Inpatient   05/18/2017 1612 06/09/2017 1445 Full Code [id number]  Bary Leriche, PA-C Inpatient   05/18/2017 1612 05/18/2017 1612 Full Code [id number]  Bary Leriche, PA-C Inpatient   05/18/2017 1612 05/18/2017 1612 Full Code [id number]  Cathlyn Parsons, PA-C Inpatient   05/18/2017 1612 05/18/2017 1612 Full Code [id number]  Cathlyn Parsons, PA-C Inpatient   05/15/2017 2348 05/18/2017 1550 Full Code [id number]  Guadalupe Dawn, MD ED   05/04/2017 1642 05/11/2017 1800 Full Code [id number]  Erline Levine, MD Inpatient       Prognosis:  < 2 weeks  Discharge Planning: Hospice facility  Care plan was discussed with primary RN  Thank you for allowing the Palliative Medicine Team to assist in the care of this patient.   Lin Landsman, NP  Please contact Palliative Medicine Team phone at [phone] for questions and concerns.   *Portions of this note are a verbal dictation therefore any spelling and/or grammatical errors are due to the "[place] [employer]" system interpretation.

## 2022-06-11 NOTE — Progress Notes (Signed)
     Daily Progress Note Intern Pager: [phone]  Patient name: Brendan [name] Medical record number: [id number] Date of birth: 07-23-1940 Age: 82 y.o. Gender: male  Primary Care Provider: Precious Gilding, DO Consultants: palliative Code Status: DNR  Pt Overview and Major Events to Date:  2/20: admitted, comfort measures  Assessment and Plan:  ELDRIGE SLADER is a 82 y.o. male admitted for agitation in the setting of prior CVA with poor prognosis, on home hospice. Pertinent PMH/PSH includes dementia, CVA, essential tremor s/p deep brain stimulator, CAD s/p CABG, HTN, HLD,  and peripheral neuropathy.   * Agitation Appears comfortable on my exam this morning -Palliative following, appreciate recommendations -Continue comfort care measures -Awaiting bed at [facility]  Recurrent UTI Per Palliative discussion with family, continue with Foley and amoxicillin for patient comfort. UC confirmed >100,000 CFU of presumed Salmonella that is susceptible to Amoxicillin. -Continue Amoxicillin through 06/26/22    FEN/GI: Dysphagia 3 diet PPx: None, comfort care Dispo: Awaiting bed at [facility]  Subjective:  No acute events overnight. Patient resting comfortably this morning. Opens eyes to voice and touch.  Objective: Temp:  [97.7 F (36.5 C)-98.9 F (37.2 C)] 97.9 F (36.6 C) (02/25 0432) Pulse Rate:  [87-100] 87 (02/25 0432) Resp:  [15-16] 16 (02/25 0432) BP: (120-172)/(73-87) 120/73 (02/25 0432) SpO2:  [92 %-96 %] 92 % (02/25 0432) Physical Exam: General: resting comfortably, eyes closed Cardiovascular: RRR, normal S1/S2 Respiratory: normal effort on room air Abdomen: soft, nondistended Neuro: sleeping, no intelligible speech, baseline facial droop present  Laboratory: Most recent CBC Lab Results  Component Value Date   WBC 5.6 06/06/2022   HGB 12.9 (L) 06/06/2022   HCT 38.0 (L) 06/06/2022   MCV 92.3 06/06/2022   PLT 123 (L) 06/06/2022   Most recent  BMP    Latest Ref Rng & Units 06/06/2022    4:20 PM  BMP  Glucose 70 - 99 mg/dL 133   BUN 8 - 23 mg/dL 9   Creatinine 0.61 - 1.24 mg/dL 0.90   Sodium 135 - 145 mmol/L 140   Potassium 3.5 - 5.1 mmol/L 3.3   Chloride 98 - 111 mmol/L 98      Imaging/Diagnostic Tests: No new imaging over last 24h.   Alcus Dad, MD 06/11/2022, 6:15 AM  PGY-3, [place] Intern pager: [phone], text pages welcome Secure chat group [place]

## 2022-06-12 DIAGNOSIS — Z515 Encounter for palliative care: Secondary | ICD-10-CM | POA: Diagnosis not present

## 2022-06-12 DIAGNOSIS — R410 Disorientation, unspecified: Secondary | ICD-10-CM | POA: Diagnosis not present

## 2022-06-12 DIAGNOSIS — R451 Restlessness and agitation: Secondary | ICD-10-CM | POA: Diagnosis not present

## 2022-06-12 DIAGNOSIS — F02811 Dementia in other diseases classified elsewhere, unspecified severity, with agitation: Secondary | ICD-10-CM | POA: Diagnosis not present

## 2022-06-12 NOTE — Progress Notes (Signed)
     Daily Progress Note Intern Pager: [phone]  Patient name: Brendan [name] Medical record number: [id number] Date of birth: 10-Jan-1941 Age: 82 y.o. Gender: male  Primary Care Provider: Precious Gilding, DO Consultants: Palliative Code Status: Full Code  Pt Overview and Major Events to Date:  2/20: Admitted, Comfort Care  Assessment and Plan: Brendan [name] MEEDS is a 82 y.o. male admitted for agitation in the setting of prior CVA with poor prognosis, on home hospice. Pertinent PMH/PSH includes dementia, CVA, essential tremor s/p deep brain stimulator, CAD s/p CABG, HTN, HLD,  and peripheral neuropathy.  * Agitation Comfortable this morning. -Palliative following, appreciate recommendations -Continue comfort care measures -Awaiting bed at [facility]  Recurrent UTI Salmonella UTI, susceptible to amoxicillin.  Continue with Foley amoxicillin for patient comfort. -Continue Amoxicillin through 06/26/22   FEN/GI: Dysphagia 3 PPx: None, comfort care Dispo: Pending hospice bed @ [name]  Subjective:  No family at bedside.  Resting comfortably in bed.  Objective: Temp:  [98.9 F (37.2 C)-100.7 F (38.2 C)] 98.9 F (37.2 C) (02/26 0431) Pulse Rate:  [40-84] 84 (02/26 0431) Resp:  [20] 20 (02/26 0431) BP: (112-141)/(66-103) 112/69 (02/26 0431) SpO2:  [94 %] 94 % (02/25 2007) Physical Exam: General: NAD, resting comfortably in bed Cardiovascular: RRR, no murmurs Respiratory: CTAB, normal breathing on room air Abdomen: Soft, nontender, nondistended.  Bowel sounds present  Laboratory: Most recent CBC Lab Results  Component Value Date   WBC 5.6 06/06/2022   HGB 12.9 (L) 06/06/2022   HCT 38.0 (L) 06/06/2022   MCV 92.3 06/06/2022   PLT 123 (L) 06/06/2022   Most recent BMP    Latest Ref Rng & Units 06/06/2022    4:20 PM  BMP  Glucose 70 - 99 mg/dL 133   BUN 8 - 23 mg/dL 9   Creatinine 0.61 - 1.24 mg/dL 0.90   Sodium 135 - 145 mmol/L 140   Potassium 3.5 -  5.1 mmol/L 3.3   Chloride 98 - 111 mmol/L 98    Brendan Dales, DO 06/12/2022, 7:28 AM  PGY-1, [place] Intern pager: [phone], text pages welcome Secure chat group [place]

## 2022-06-12 NOTE — Progress Notes (Signed)
Daily Progress Note   Patient Name: Brendan [name]       Date: 06/12/2022 DOB: [birth year]  Age: 82 y.o. MRN#: [id number] Attending Physician: Dickie La, MD Primary Care Physician: Precious Gilding, DO Admit Date: 06/06/2022  Reason for Consultation/Follow-up: Non pain symptom management, Pain control, Psychosocial/spiritual support, and Terminal Care  Subjective: I have reviewed medical records including EPIC notes and labs. Patient assessed at the bedside.  He is lethargic but easily aroused.  Attempts to communicate and respond to questions but is difficult to understand.  Responds both yes and no to this PA's questions about his pain.  No family present during my visit.  Assisted patient with drinking spoonfuls of water.  Discussed with RN.  Length of Stay: 4  Physical Exam Vitals and nursing note reviewed.  Constitutional:      General: He is not in acute distress.    Appearance: He is ill-appearing.  Pulmonary:     Effort: No respiratory distress.  Skin:    General: Skin is warm and dry.  Neurological:     Mental Status: He is lethargic.     Motor: Weakness present.            Vital Signs: BP (!) 144/59 (BP Location: Left Arm)   Pulse (!) 36   Temp 98.5 F (36.9 C)   Resp 20   Wt 70.8 kg   SpO2 96%   BMI 23.73 kg/m  SpO2: SpO2: 96 % O2 Device: O2 Device: Room Air O2 Flow Rate:         Palliative Assessment/Data: PPS 10%   Palliative Care Assessment & Plan   Patient Profile: 82 y.o. male  with past medical history of CAD, unstable angina, HTN, HLD, Dementia, and Thyroid dysfunction admitted on 06/06/2022 with worsening agitation, confusion, facial droop.    Patient agitation and confusion have been chronic, recurrent, and escalating in severity recently.  Stroke  workup was negative and no further workup was pursued given patient's hospice status. PMT has been consulted to assist with goals of care conversation regarding options for placement with hospice.  Assessment: Principal Problem:   Agitation Active Problems:   Recurrent UTI   Dementia due to general medical condition, with agitation ([place])   Altered mental status  Recommendations/Plan: Continue DNR/DNI Continue comfort care per [facility], no  adjustments required today Pending bed availability, patient will transfer to [facility] Psychosocial and emotional support provided PMT will continue to follow and support  Prognosis:  < 2 weeks  Discharge Planning: Hospice facility  Care plan was discussed with primary RNs    MDM: high   [name] Gregary Signs Palliative Medicine Team Team phone # [phone]  Thank you for allowing the Palliative Medicine Team to assist in the care of this patient. Please utilize secure chat with additional questions, if there is no response within 30 minutes please call the above phone number.  Palliative Medicine Team providers are available by phone from 7am to 7pm daily and can be reached through the team cell phone.  Should this patient require assistance outside of these hours, please call the patient's attending physician.  Portions of this note are a verbal dictation therefore any spelling and/or grammatical errors are due to the "[place] One" system interpretation.

## 2022-06-12 NOTE — Care Management Important Message (Signed)
Important Message  Patient Details  Name: Brendan [name] MRN: [id number] Date of Birth: 1940-05-01   Medicare Important Message Given:  Yes     Orbie Pyo 06/12/2022, 3:23 PM

## 2022-06-12 NOTE — TOC Transition Note (Addendum)
Transition of Care [facility]) - CM/SW Discharge Note   Patient Details  Name: Brendan [name] MRN: [id number] Date of Birth: 1940/11/20  Transition of [facility]) CM/SW Contact:  Curlene Labrum, RN Phone Number: 06/12/2022, 12:26 PM   Clinical Narrative:    CM called and spoke with CM with [facility] and they have an available bed for the patient at [facility] today.  Attending MD Team was notified to please placed discharge orders and summary.  CM with [facility] will reach out to the daughter/wife to complete admission paperwork.   [facility] will be scheduled for transport once the admission paperwork is completed.  DNR is present and signed in the chart.  06/12/2022 1422 - CM spoke with Deidre Ala, CM at [facility] and family has not signed admission paperwork at this time but is pending.  Deidre Ala, RN will follow up with me when I can arrange [facility].  06/12/2022 - [facility] called for transport to the facility.  Bedside nursing - pLease call nursing report to the [facility] in [place] at [phone].   Final next level of care: [place] Barriers to Discharge: Continued Medical Work up   Patient Goals and CMS Choice CMS Medicare.gov Compare Post Acute Care list provided to:: Patient Represenative (must comment) (wife at bedside) Choice offered to / list presented to : Spouse  Discharge Placement                         Discharge Plan and Services Additional resources added to the After Visit Summary for     Discharge Planning Services: CM Consult Post Acute Care Choice: Residential Hospice Bed                               Social Determinants of Health (SDOH) Interventions SDOH Screenings   Food Insecurity: No Food Insecurity (08/13/2019)  Housing: Low Risk  (08/13/2019)  Transportation Needs: No Transportation Needs (08/13/2019)  Alcohol Screen: Low Risk  (08/13/2019)  Depression (PHQ2-9): High  Risk (01/04/2022)  Financial Resource Strain: Low Risk  (08/13/2019)  Physical Activity: Inactive (08/13/2019)  Social Connections: Moderately Integrated (08/13/2019)  Stress: No Stress Concern Present (08/13/2019)  Tobacco Use: Medium Risk (06/06/2022)     Readmission Risk Interventions     No data to display

## 2022-07-03 ENCOUNTER — Telehealth: Payer: Self-pay | Admitting: Anesthesiology

## 2022-07-03 NOTE — Telephone Encounter (Signed)
Called patients daughter and spokie with her about her dads passing

## 2022-07-03 NOTE — Telephone Encounter (Signed)
Pt's daughter Lovey Newcomer left message, stating she would like to speak to [place] did not go into detail.

## 2022-07-17 DEATH — deceased

## 2022-07-25 ENCOUNTER — Other Ambulatory Visit: Payer: Self-pay | Admitting: Cardiology

## 2022-09-14 ENCOUNTER — Encounter: Payer: PPO | Admitting: Neurology
